# Patient Record
Sex: Male | Born: 2000 | Hispanic: Yes | Marital: Married | Smoking: Current every day smoker
Health system: Southern US, Community
[De-identification: ages and names within clinical notes are randomized; demographics above are authoritative.]

## PROBLEM LIST (undated history)

## (undated) ENCOUNTER — Emergency Department (HOSPITAL_COMMUNITY): Payer: Medicaid Other | Source: Home / Self Care

## (undated) ENCOUNTER — Emergency Department (HOSPITAL_COMMUNITY): Admission: EM | Payer: Self-pay | Source: Home / Self Care

## (undated) DIAGNOSIS — J45909 Unspecified asthma, uncomplicated: Secondary | ICD-10-CM

## (undated) DIAGNOSIS — R011 Cardiac murmur, unspecified: Secondary | ICD-10-CM

## (undated) DIAGNOSIS — F988 Other specified behavioral and emotional disorders with onset usually occurring in childhood and adolescence: Secondary | ICD-10-CM

## (undated) DIAGNOSIS — G40909 Epilepsy, unspecified, not intractable, without status epilepticus: Secondary | ICD-10-CM

## (undated) DIAGNOSIS — Z5900 Homelessness unspecified: Secondary | ICD-10-CM

## (undated) DIAGNOSIS — H539 Unspecified visual disturbance: Secondary | ICD-10-CM

## (undated) DIAGNOSIS — F913 Oppositional defiant disorder: Secondary | ICD-10-CM

## (undated) DIAGNOSIS — F329 Major depressive disorder, single episode, unspecified: Secondary | ICD-10-CM

## (undated) DIAGNOSIS — G43909 Migraine, unspecified, not intractable, without status migrainosus: Secondary | ICD-10-CM

## (undated) DIAGNOSIS — F32A Depression, unspecified: Secondary | ICD-10-CM

## (undated) DIAGNOSIS — E109 Type 1 diabetes mellitus without complications: Secondary | ICD-10-CM

## (undated) DIAGNOSIS — F419 Anxiety disorder, unspecified: Secondary | ICD-10-CM

## (undated) DIAGNOSIS — R51 Headache: Secondary | ICD-10-CM

## (undated) DIAGNOSIS — B1920 Unspecified viral hepatitis C without hepatic coma: Secondary | ICD-10-CM

## (undated) DIAGNOSIS — T7840XA Allergy, unspecified, initial encounter: Secondary | ICD-10-CM

## (undated) DIAGNOSIS — D573 Sickle-cell trait: Secondary | ICD-10-CM

## (undated) HISTORY — PX: CIRCUMCISION: SUR203

---
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
## 2018-01-27 ENCOUNTER — Other Ambulatory Visit (INDEPENDENT_AMBULATORY_CARE_PROVIDER_SITE_OTHER): Payer: Self-pay | Admitting: *Deleted

## 2018-01-27 DIAGNOSIS — E109 Type 1 diabetes mellitus without complications: Secondary | ICD-10-CM

## 2018-02-23 ENCOUNTER — Encounter (HOSPITAL_COMMUNITY): Payer: Self-pay | Admitting: Emergency Medicine

## 2018-02-23 ENCOUNTER — Other Ambulatory Visit: Payer: Self-pay

## 2018-02-23 ENCOUNTER — Emergency Department (HOSPITAL_COMMUNITY)
Admission: EM | Admit: 2018-02-23 | Discharge: 2018-02-25 | Disposition: A | Discharge status: Other Acute Inpatient Hospital | Payer: Medicaid Other | Attending: Emergency Medicine | Admitting: Emergency Medicine

## 2018-02-23 DIAGNOSIS — J45909 Unspecified asthma, uncomplicated: Secondary | ICD-10-CM | POA: Insufficient documentation

## 2018-02-23 DIAGNOSIS — Z79899 Other long term (current) drug therapy: Secondary | ICD-10-CM | POA: Insufficient documentation

## 2018-02-23 DIAGNOSIS — F332 Major depressive disorder, recurrent severe without psychotic features: Secondary | ICD-10-CM | POA: Insufficient documentation

## 2018-02-23 DIAGNOSIS — R739 Hyperglycemia, unspecified: Secondary | ICD-10-CM

## 2018-02-23 DIAGNOSIS — F913 Oppositional defiant disorder: Secondary | ICD-10-CM | POA: Insufficient documentation

## 2018-02-23 DIAGNOSIS — Z7722 Contact with and (suspected) exposure to environmental tobacco smoke (acute) (chronic): Secondary | ICD-10-CM | POA: Insufficient documentation

## 2018-02-23 DIAGNOSIS — Z794 Long term (current) use of insulin: Secondary | ICD-10-CM | POA: Insufficient documentation

## 2018-02-23 DIAGNOSIS — F902 Attention-deficit hyperactivity disorder, combined type: Secondary | ICD-10-CM | POA: Insufficient documentation

## 2018-02-23 DIAGNOSIS — R45851 Suicidal ideations: Secondary | ICD-10-CM | POA: Insufficient documentation

## 2018-02-23 DIAGNOSIS — E1165 Type 2 diabetes mellitus with hyperglycemia: Secondary | ICD-10-CM | POA: Insufficient documentation

## 2018-02-23 DIAGNOSIS — D573 Sickle-cell trait: Secondary | ICD-10-CM | POA: Insufficient documentation

## 2018-02-23 DIAGNOSIS — F419 Anxiety disorder, unspecified: Secondary | ICD-10-CM | POA: Insufficient documentation

## 2018-02-23 LAB — ACETAMINOPHEN LEVEL: Acetaminophen (Tylenol), Serum: <10 ug/mL — ABNORMAL LOW (ref 10–30)

## 2018-02-23 LAB — COMPREHENSIVE METABOLIC PANEL
ALBUMIN: 3.8 g/dL (ref 3.5–5.0)
ALK PHOS: 97 U/L (ref 38–126)
ALT: 19 U/L (ref 0–44)
AST: 24 U/L (ref 15–41)
Anion gap: 13 (ref 5–15)
BILIRUBIN TOTAL: 0.8 mg/dL (ref 0.3–1.2)
BUN: 6 mg/dL (ref 6–20)
CALCIUM: 8.9 mg/dL (ref 8.9–10.3)
CO2: 22 mmol/L (ref 22–32)
Chloride: 101 mmol/L (ref 98–111)
Creatinine, Ser: 0.97 mg/dL (ref 0.61–1.24)
GFR calc Af Amer: >60 mL/min (ref >60)
GFR calc non Af Amer: >60 mL/min (ref >60)
GLUCOSE: 431 mg/dL — AB (ref 70–99)
Potassium: 3.6 mmol/L (ref 3.5–5.1)
SODIUM: 136 mmol/L (ref 135–145)
TOTAL PROTEIN: 6.4 g/dL — AB (ref 6.5–8.1)

## 2018-02-23 LAB — ETHANOL: Alcohol, Ethyl (B): <10 mg/dL (ref <10)

## 2018-02-23 LAB — CBG MONITORING, ED
GLUCOSE-CAPILLARY: 448 mg/dL — AB (ref 70–99)
GLUCOSE-CAPILLARY: 171 mg/dL — AB (ref 70–99)

## 2018-02-23 LAB — CBC
HEMATOCRIT: 45.5 % (ref 39.0–52.0)
HEMOGLOBIN: 15.1 g/dL (ref 13.0–17.0)
MCH: 27.6 pg (ref 26.0–34.0)
MCHC: 33.2 g/dL (ref 30.0–36.0)
MCV: 83 fL (ref 78.0–100.0)
Platelets: 236 10*3/uL (ref 150–400)
RBC: 5.48 MIL/uL (ref 4.22–5.81)
RDW: 12.1 % (ref 11.5–15.5)
WBC: 7.1 10*3/uL (ref 4.0–10.5)

## 2018-02-23 LAB — RAPID URINE DRUG SCREEN, HOSP PERFORMED
Amphetamines: NOT DETECTED
BARBITURATES: NOT DETECTED
Benzodiazepines: NOT DETECTED
Cocaine: NOT DETECTED
Opiates: NOT DETECTED
TETRAHYDROCANNABINOL: NOT DETECTED

## 2018-02-23 LAB — SALICYLATE LEVEL: Salicylate Lvl: <7 mg/dL (ref 2.8–30.0)

## 2018-02-23 MED ORDER — INSULIN GLARGINE 100 UNIT/ML ~~LOC~~ SOLN
30.0000 [IU] | Freq: Two times a day (BID) | SUBCUTANEOUS | Status: DC
  Administered 2018-02-23 – 2018-02-25 (×4): 30 [IU] via SUBCUTANEOUS
  Filled 2018-02-23 (×8): qty 0.3

## 2018-02-23 NOTE — ED Triage Notes (Signed)
Pt arrived [facility] from [facility] where he reported suicidal ideation with plan to jump in from of a bus to the psychiatrist, pt is currently IVC, papers served prior to pt arrival. Pt also presents with hyperglycemia, Per EMS glucometer read "high" at [facility] and pt administered 20 units Lantus, pt received 2L NS with EMS PTA. Vitals with EMS: BP152/90 P120 RR18 98% RA las CBG with EMS 464

## 2018-02-23 NOTE — ED Provider Notes (Signed)
[facility] EMERGENCY DEPARTMENT Provider Note   CSN: [id number] Arrival date & time: 02/23/18  1624     History   Chief Complaint Chief Complaint  Patient presents with  . Suicidal  . Hyperglycemia    HPI [name] [name]. is a 17 y.o. male ending for evaluation of suicidal thoughts and hyperglycemia.  Patient states [name] [name] has been having suicidal thoughts.  [name] [name] states his family does not want him to live at home, [name] [name] has an ankle monitor and is not able to leave.  [name] [name] thinks his only option is to go back to prison.  [name] [name] was released several months ago, does not want to go back.  [name] [name] denies HI or AVH.  [name] [name] is currently under IVC per triage note. Patient states [name] [name] was at [facility] when they checked his blood sugar, it was read as high.  [name] [name] took 20 units of Lantus, received 2 L normal saline with EMS.  CBG on arrival was 464. Patient states his only complaint is [name] [name] feels hungry.  [name] [name] denies polyuria or polydipsia.  [name] [name] denies recent fevers, chills, chest pain, shortness breath, nausea, vomiting, abdominal pain, or abnormal urination.  [name] [name] has no medical complaints at this time.  Patient states [name] [name] has not been taking his insulin on a scheduled basis, due to change in the way insulin was given while [name] [name] was in jail.  Additionally, [name] [name] is not taking any of his other medications that [name] [name] states [name] [name] is supposed to be on.  HPI  Past Medical History:  Diagnosis Date  . ADD (attention deficit disorder)   . Allergy   . Anxiety   . Asthma   . Depression   . Epileptic seizure (HCC)    "both petit and grand mal; last sz ~ 2 wk ago" (06/17/2013)  . Headache(784.0)    "2-3 times/wk usually" (06/17/2013)  . Heart murmur    "heard a slight one earlier today" (06/17/2013)  . Migraine    "maybe once/month; it's severe" (06/17/2013)  . ODD (oppositional defiant disorder)   . Sickle cell trait (HCC)   . Type I diabetes mellitus (HCC)    "that's what they're thinking now" (06/17/2013)  .  Vision abnormalities    "takes him longer to focus cause; from his sz" (06/17/2013)    Patient Active Problem List   Diagnosis Date Noted  . 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 DKA, type 1 (HCC) 07/16/2017  . Nausea & vomiting 07/16/2017  . Uncontrolled type 1 diabetes circulatory disorder erectile dysfunction (HCC)   . DKA (diabetic ketoacidoses) (HCC) 12/18/2016  . History of seizures 12/01/2016  . History of migraine 12/01/2016  . 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 Disordered eating 03/08/2015  . Ketonuria   . Adjustment reaction to medical therapy   . Non compliance w medication regimen   . 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 Dehydration 08/22/2014  . Hyperglycemia due to type 1 diabetes mellitus (HCC) 08/21/2014  . Type 1 diabetes mellitus with hyperglycemia (HCC)   . Noncompliance   . Generalized abdominal pain   . Glycosuria   . Depression 07/12/2014  . Involuntary movements 06/13/2014  . Bilateral leg pain 06/13/2014  . 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 Suicidal ideation 03/31/2014  . ODD (oppositional defiant disorder) 03/03/2014  . MDD (major depressive disorder), recurrent episode,  moderate (HCC) 02/16/2014  . Attention deficit hyperactivity disorder (ADHD), combined type, moderate 02/16/2014  . Microcytic anemia 02/15/2014  . Hyperglycemia 02/14/2014  . Goiter 02/04/2014  . 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 Acquired acanthosis nigricans 02/04/2014  . Obesity, morbid (HCC) 02/04/2014  . Insulin resistance 02/04/2014  . Hyperinsulinemia 02/04/2014  . Hypoglycemia associated with diabetes (HCC) 02/02/2014  . Maladaptive health behaviors affecting medical condition 02/02/2014  . Hypoglycemia  02/02/2014  . Type 1 diabetes mellitus in patient 30 to 19 years of age with hemoglobin A1c goal of less than 7.5% (HCC) 01/26/2014  . 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  . 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 Migraine without aura 12/13/2013  . Body mass index, pediatric, greater than or equal to 95th percentile for age 38/16/2015  . Asthma 12/13/2013  . Hypoglycemia unawareness in type 1 diabetes mellitus (HCC) 08/08/2013  . Seizure disorder (HCC) 07/06/2013  . Short-term memory loss 07/06/2013  . Sickle cell trait (HCC) 07/06/2013  . Diabetes (HCC) 06/17/2013  . Diabetes mellitus, new onset (HCC) 06/17/2013    Past Surgical History:  Procedure Laterality Date  . CIRCUMCISION  2000  . FINGER SURGERY Left 2001   "crushed pinky; had to repair it" (06/17/2013)        Home Medications    Prior to Admission medications   Medication Sig Start Date End Date Taking? Authorizing Provider  albuterol (PROVENTIL HFA;VENTOLIN HFA) 108 (90 Base) MCG/ACT inhaler Inhale 2 puffs into the lungs every 6 (six) hours as needed for wheezing or shortness of breath.    [provider]  cetirizine (ZYRTEC) 10 MG tablet Take 10 mg by mouth at bedtime as needed for allergies.    [provider]  chlorproMAZINE (THORAZINE) 50 MG tablet Take 1 tablet (50 mg total) by mouth 2 (two) times daily. For mood control Patient not taking: Reported on 08/15/2017 08/07/17   Armandina Stammer I, NP  clindamycin (CLEOCIN T) 1 % external solution Apply 1 application topically 2 (two) times daily as needed. Apply to affected area twice a day if needed.    [provider]  EPINEPHrine 0.3 mg/0.3 mL IJ SOAJ injection Inject 0.3 mg into the muscle as needed (PRN for anaphylaxis).    [provider]  escitalopram (LEXAPRO) 20 MG tablet Take 1 tablet (20 mg total) by mouth daily. For depression Patient not taking: Reported on 08/15/2017 08/08/17   Armandina Stammer I, NP  fluticasone (FLONASE) 50 MCG/ACT nasal spray Place 1 spray into both nostrils daily.    [provider]  fluticasone (FLOVENT HFA) 110 MCG/ACT inhaler Inhale 1 puff into the lungs 2 (two) times daily.    [provider]  gabapentin (NEURONTIN) 300 MG capsule Take 1 capsule (300 mg total) by mouth 3 (three) times daily. For agitation Patient not taking: Reported on 08/15/2017 08/07/17   Armandina Stammer I, NP  glucagon (GLUCAGON EMERGENCY) 1 MG injection Inject 1 mg into the muscle once as needed (severe hypoglycemia - if unresponsive, unable to swallow, unconscious and/or has seizure). Patient not taking: Reported on 08/15/2017 08/07/17   Armandina Stammer I, NP  glucose blood (ACCU-CHEK GUIDE) test strip Test 8 times daily: For blood sugar checks or monitoring Patient not taking: Reported on 08/15/2017 08/07/17 08/07/18  Armandina Stammer I, NP  glucose blood (ACCU-CHEK GUIDE) test strip Check glucose 6x daily: For blood sugar checks Patient not taking: Reported on 08/15/2017 08/07/17   Nwoko,  Nelda Marseille, NP  hydrOXYzine (ATARAX/VISTARIL) 25 MG tablet Take 1 tablet (25 mg total) by mouth every 6 (six) hours as needed for anxiety. Patient not taking: Reported on 08/15/2017 08/07/17   Armandina Stammer I, NP  insulin aspart (NOVOLOG FLEXPEN) 100 UNIT/ML FlexPen Inject per protocol up to 50 units per day: For diabetes management Patient not taking: Reported on 08/15/2017 08/07/17 08/07/18  Armandina Stammer I, NP  insulin glargine (LANTUS) 100 UNIT/ML injection Inject 0.3 mLs (30 Units total) into the skin 2 (two) times daily. For diabetes management Patient not taking: Reported on 08/15/2017 08/07/17   Armandina Stammer I, NP  nicotine (NICODERM CQ - DOSED IN MG/24 HOURS) 21 mg/24hr patch Place 1 patch (21 mg total) onto the skin daily. For smoking cessation Patient not taking: Reported on 08/15/2017 08/08/17   Armandina Stammer I, NP  SUMAtriptan (IMITREX) 25 MG tablet Take 25 mg by mouth daily as needed for migraine. May repeat  in 2 hours if headache persists or recurs.    [provider]  traZODone (DESYREL) 100 MG tablet Take 1 tablet (100 mg) by mouth at bedtime: For sleep Patient not taking: Reported on 08/15/2017 08/07/17   Sanjuana Kava, NP    Family History Family History  Problem Relation Age of Onset  . Asthma Mother   . COPD Mother   . Other Mother        possible autoimmune, unclear  . Diabetes Maternal Grandmother   . Heart disease Maternal Grandmother   . Hypertension Maternal Grandmother   . Mental illness Maternal Grandmother   . Heart disease Maternal Grandfather   . Hyperlipidemia Paternal Grandmother   . Hyperlipidemia Paternal Grandfather   . Migraines Sister        Hemiplegic Migraines     Social History Social History   Tobacco Use  . Smoking status: Passive Smoke Exposure - Never Smoker  . Smokeless tobacco: Never Used  . Tobacco comment: Mom and dad smoke outside   Substance Use Topics  . Alcohol use: No    Alcohol/week: 0.0 standard drinks  . Drug use: No     Allergies   Bee venom; Fish allergy; and Shellfish allergy   Review of Systems Review of Systems  Psychiatric/Behavioral: Positive for dysphoric mood and suicidal ideas.  All other systems reviewed and are negative.    Physical Exam Updated Vital Signs BP (!) 146/81 (BP Location: Right Arm)   Pulse 72   Temp 98.7 F (37.1 C) (Oral)   Resp 18   SpO2 100%   Physical Exam  Constitutional: [name] [name] is oriented to person, place, and time. [name] [name] appears well-developed and well-nourished. No distress.  Sitting in the chair in no acute distress  HENT:  Head: Normocephalic and atraumatic.  Eyes: Pupils are equal, round, and reactive to light. Conjunctivae and EOM are normal.  Neck: Normal range of motion. Neck supple.  Cardiovascular: Normal rate, regular rhythm and intact distal pulses.  Pulmonary/Chest: Effort normal and breath sounds normal. No respiratory distress. [name] [name] has no wheezes.  Abdominal: Soft.  [name] [name] exhibits no distension and no mass. There is no tenderness. There is no guarding.  Musculoskeletal: Normal range of motion.  Neurological: [name] [name] is alert and oriented to person, place, and time.  Skin: Skin is warm and dry.  Psychiatric: [name] [name] is not actively hallucinating. [name] [name] exhibits a depressed mood. [name] [name] expresses suicidal ideation. [name] [name] expresses no homicidal ideation. [name] [name] expresses suicidal plans. [name] [name] expresses no homicidal plans.  Calm and cooperative.  Reporting SI without HI or AVH.  Nursing note and vitals reviewed.    ED Treatments / Results  Labs (all labs ordered are listed, but only abnormal results are displayed) Labs Reviewed  COMPREHENSIVE METABOLIC PANEL - Abnormal; Notable for the following components:      Result Value   Glucose, Bld 431 (*)    Total Protein 6.4 (*)    All other components within normal limits  ACETAMINOPHEN LEVEL - Abnormal; Notable for the following components:   Acetaminophen (Tylenol), Serum <10 (*)    All other components within normal limits  CBG MONITORING, ED - Abnormal; Notable for the following components:   Glucose-Capillary 448 (*)    All other components within normal limits  CBG MONITORING, ED - Abnormal; Notable for the following components:   Glucose-Capillary 171 (*)    All other components within normal limits  ETHANOL  SALICYLATE LEVEL  CBC  RAPID URINE DRUG SCREEN, HOSP PERFORMED    EKG None  Radiology No results found.  Procedures Procedures (including critical care time)  Medications Ordered in ED Medications  insulin glargine (LANTUS) injection 30 Units (has no administration in time range)     Initial Impression / Assessment and Plan / ED Course  I have reviewed the triage vital signs and the nursing notes.  Pertinent labs & imaging results that were available during my care of the patient were reviewed by me and considered in my medical decision making (see chart for details).     Patient presenting for  evaluation of suicidal thoughts and hyperglycemia.  Exam reassuring, patient appears nontoxic.  CBG improved in between initial reading this morning and arrival to the ER.  Will recheck.  Labs reassuring, anion gap and bicarb stable, no signs of DKA.  Urine without ketones. Likely hyperglycemia due to improper insulin use.  On reassessment, CBG improved to 171.  At this time, patient is medically cleared.  Discussed with Berna Spare from TTS, who recommends placing a TTS consult for further psychiatric evaluation.   Final Clinical Impressions(s) / ED Diagnoses   Final diagnoses:  Suicidal ideation  Hyperglycemia    ED Discharge Orders    None       Alveria Apley, PA-C 02/23/18 2118    Linwood Dibbles, MD 02/24/18 1143

## 2018-02-24 LAB — CBG MONITORING, ED
GLUCOSE-CAPILLARY: 377 mg/dL — AB (ref 70–99)
GLUCOSE-CAPILLARY: 310 mg/dL — AB (ref 70–99)
Glucose-Capillary: 522 mg/dL (ref 70–99)
Glucose-Capillary: 389 mg/dL — ABNORMAL HIGH (ref 70–99)
Glucose-Capillary: 271 mg/dL — ABNORMAL HIGH (ref 70–99)
Glucose-Capillary: 337 mg/dL — ABNORMAL HIGH (ref 70–99)

## 2018-02-24 MED ORDER — INSULIN ASPART 100 UNIT/ML ~~LOC~~ SOLN
6.0000 [IU] | Freq: Once | SUBCUTANEOUS | Status: AC
  Administered 2018-02-24: 6 [IU] via SUBCUTANEOUS
  Filled 2018-02-24: qty 1

## 2018-02-24 MED ORDER — INSULIN ASPART 100 UNIT/ML ~~LOC~~ SOLN
15.0000 [IU] | Freq: Once | SUBCUTANEOUS | Status: AC
  Administered 2018-02-24: 15 [IU] via SUBCUTANEOUS

## 2018-02-24 MED ORDER — INSULIN ASPART 100 UNIT/ML ~~LOC~~ SOLN
0.0000 [IU] | Freq: Three times a day (TID) | SUBCUTANEOUS | Status: DC
  Administered 2018-02-24: 15 [IU] via SUBCUTANEOUS
  Administered 2018-02-24: 11 [IU] via SUBCUTANEOUS
  Administered 2018-02-25: 3 [IU] via SUBCUTANEOUS
  Administered 2018-02-25: 5 [IU] via SUBCUTANEOUS
  Filled 2018-02-24 (×5): qty 1

## 2018-02-24 MED ORDER — LORAZEPAM 1 MG PO TABS
1.0000 mg | ORAL_TABLET | Freq: Four times a day (QID) | ORAL | Status: DC | PRN
  Administered 2018-02-24: 1 mg via ORAL
  Filled 2018-02-24: qty 1

## 2018-02-24 NOTE — ED Notes (Signed)
TTS at bedside. 

## 2018-02-24 NOTE — Progress Notes (Signed)
Inpatient Diabetes Program Recommendations  AACE/ADA: New Consensus Statement on Inpatient Glycemic Control (2019)  Target Ranges:  Prepandial:   less than 140 mg/dL      Peak postprandial:   less than 180 mg/dL (1-2 hours)      Critically ill patients:  140 - 180 mg/dL  Results for [name] [name], [name] [name] (MRN [id number]) as of 02/24/2018 12:34  Ref. Range 02/23/2018 16:33 02/23/2018 20:24 02/24/2018 06:32 02/24/2018 11:26  Glucose-Capillary Latest Ref Range: 70 - 99 mg/dL 336 (H) 122 (H) 449 (H) 522 (HH)    Review of Glycemic Control  Diabetes history: DM1 Outpatient Diabetes medications: Lantus 35 units BID, Novolog 0-15 units TID with meals Current orders for Inpatient glycemic control: Lantus 30 units BID, Novolog 0-15 units TID with meals  Inpatient Diabetes Program Recommendations:  Correction (SSI): Please consider ordering Novolog 0-5 units QHS for bedtime correction scale. Insulin - Meal Coverage: Please consider ordering Novolog 8 units TID with meals for meal coverage. Diet: Please discontinue Regular diet and order Carb Modified diet.  Insulin-Basal: If glucose continues to be consistently over 180 mg/dl with meal coverage, may want to consider increasing Lantus to 35 units BID.  Thanks, Orlando Penner, RN, MSN, CDE Diabetes Coordinator Inpatient Diabetes Program [phone] (Team Pager from 8am to 5pm)

## 2018-02-24 NOTE — ED Notes (Signed)
Pt refused to eat dinner, RN aware

## 2018-02-24 NOTE — ED Notes (Signed)
parmacy called to send me pts latus

## 2018-02-24 NOTE — ED Notes (Signed)
Pt made phone call X1
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
## 2018-02-24 NOTE — ED Notes (Signed)
Dinner order place, Diabetic Diet

## 2018-02-24 NOTE — ED Notes (Signed)
CBG RESULT 271, RN AWARE

## 2018-02-24 NOTE — ED Notes (Signed)
Pt threw away dinner tray after refusing to eat meal, RN was made aware and talked with patient after which patient agreed to eat meal and another meal tray was ordered.  Dinner tray ordered talked with [name] [name] at [employer].

## 2018-02-24 NOTE — ED Notes (Signed)
Pt ate some of dinner tray

## 2018-02-24 NOTE — ED Notes (Signed)
Dr Effie Shy notified that pts cbg  Was 522 and order for 15 units rec

## 2018-02-24 NOTE — ED Notes (Signed)
Pt pacing around room and asking when he will be able to leave. Pt informed we have to get his blood sugar controlled. Asked patient if he feels anxious and if he wants to try to sleep. Asked if patient wants prn ativan. Pt states yes

## 2018-02-24 NOTE — ED Notes (Signed)
Patient has made 1 phone call

## 2018-02-24 NOTE — ED Notes (Signed)
CBG RESULT 337, RN AWARE

## 2018-02-24 NOTE — ED Notes (Addendum)
Pt. Meets inpatient criteria per [facility]. Look for placement in the AM

## 2018-02-24 NOTE — Progress Notes (Addendum)
Pt. meets criteria for inpatient treatment per [name], NP.  Referred out to the following hospitals:  [facility]  [facility]  [facility]  [facility]  [facility]  [facility]  [facility]  [facility]  [facility]    Disposition CSW will continue to follow for placement.  Timmothy Euler. [name], MSW, LCSWA Disposition Clinical Social Work [phone] (cell) [phone] (office)

## 2018-02-25 ENCOUNTER — Inpatient Hospital Stay (HOSPITAL_COMMUNITY)
Admission: AD | Admit: 2018-02-25 | Discharge: 2018-03-09 | DRG: 885 | Disposition: A | Discharge status: Home / Self Care | Payer: Medicaid Other | Source: Intra-hospital | Attending: Psychiatry | Admitting: Psychiatry

## 2018-02-25 ENCOUNTER — Encounter (HOSPITAL_COMMUNITY): Payer: Self-pay | Admitting: *Deleted

## 2018-02-25 ENCOUNTER — Other Ambulatory Visit: Payer: Self-pay

## 2018-02-25 DIAGNOSIS — Z653 Problems related to other legal circumstances: Secondary | ICD-10-CM | POA: Diagnosis not present

## 2018-02-25 DIAGNOSIS — Z915 Personal history of self-harm: Secondary | ICD-10-CM | POA: Diagnosis not present

## 2018-02-25 DIAGNOSIS — Z794 Long term (current) use of insulin: Secondary | ICD-10-CM

## 2018-02-25 DIAGNOSIS — F603 Borderline personality disorder: Secondary | ICD-10-CM | POA: Diagnosis not present

## 2018-02-25 DIAGNOSIS — Z818 Family history of other mental and behavioral disorders: Secondary | ICD-10-CM | POA: Diagnosis not present

## 2018-02-25 DIAGNOSIS — G40409 Other generalized epilepsy and epileptic syndromes, not intractable, without status epilepticus: Secondary | ICD-10-CM | POA: Diagnosis not present

## 2018-02-25 DIAGNOSIS — F419 Anxiety disorder, unspecified: Secondary | ICD-10-CM | POA: Diagnosis not present

## 2018-02-25 DIAGNOSIS — J45909 Unspecified asthma, uncomplicated: Secondary | ICD-10-CM | POA: Diagnosis present

## 2018-02-25 DIAGNOSIS — F913 Oppositional defiant disorder: Secondary | ICD-10-CM | POA: Diagnosis not present

## 2018-02-25 DIAGNOSIS — G4700 Insomnia, unspecified: Secondary | ICD-10-CM | POA: Diagnosis not present

## 2018-02-25 DIAGNOSIS — E1042 Type 1 diabetes mellitus with diabetic polyneuropathy: Secondary | ICD-10-CM | POA: Diagnosis not present

## 2018-02-25 DIAGNOSIS — E119 Type 2 diabetes mellitus without complications: Secondary | ICD-10-CM | POA: Diagnosis not present

## 2018-02-25 DIAGNOSIS — Z79899 Other long term (current) drug therapy: Secondary | ICD-10-CM | POA: Diagnosis not present

## 2018-02-25 DIAGNOSIS — F333 Major depressive disorder, recurrent, severe with psychotic symptoms: Secondary | ICD-10-CM | POA: Diagnosis not present

## 2018-02-25 DIAGNOSIS — R45851 Suicidal ideations: Secondary | ICD-10-CM | POA: Diagnosis not present

## 2018-02-25 DIAGNOSIS — F902 Attention-deficit hyperactivity disorder, combined type: Secondary | ICD-10-CM | POA: Diagnosis present

## 2018-02-25 DIAGNOSIS — E1043 Type 1 diabetes mellitus with diabetic autonomic (poly)neuropathy: Secondary | ICD-10-CM | POA: Diagnosis not present

## 2018-02-25 DIAGNOSIS — Z7722 Contact with and (suspected) exposure to environmental tobacco smoke (acute) (chronic): Secondary | ICD-10-CM | POA: Diagnosis present

## 2018-02-25 DIAGNOSIS — R451 Restlessness and agitation: Secondary | ICD-10-CM | POA: Diagnosis not present

## 2018-02-25 DIAGNOSIS — F609 Personality disorder, unspecified: Secondary | ICD-10-CM | POA: Diagnosis not present

## 2018-02-25 DIAGNOSIS — Z9114 Patient's other noncompliance with medication regimen: Secondary | ICD-10-CM

## 2018-02-25 DIAGNOSIS — G259 Extrapyramidal and movement disorder, unspecified: Secondary | ICD-10-CM | POA: Diagnosis not present

## 2018-02-25 DIAGNOSIS — F909 Attention-deficit hyperactivity disorder, unspecified type: Secondary | ICD-10-CM | POA: Diagnosis not present

## 2018-02-25 DIAGNOSIS — F329 Major depressive disorder, single episode, unspecified: Secondary | ICD-10-CM | POA: Diagnosis present

## 2018-02-25 DIAGNOSIS — Z590 Homelessness: Secondary | ICD-10-CM | POA: Diagnosis not present

## 2018-02-25 DIAGNOSIS — D573 Sickle-cell trait: Secondary | ICD-10-CM | POA: Diagnosis not present

## 2018-02-25 DIAGNOSIS — IMO0001 Reserved for inherently not codable concepts without codable children: Secondary | ICD-10-CM

## 2018-02-25 DIAGNOSIS — E1065 Type 1 diabetes mellitus with hyperglycemia: Secondary | ICD-10-CM

## 2018-02-25 LAB — GLUCOSE, CAPILLARY
Glucose-Capillary: 280 mg/dL — ABNORMAL HIGH (ref 70–99)
Glucose-Capillary: 275 mg/dL — ABNORMAL HIGH (ref 70–99)

## 2018-02-25 LAB — CBG MONITORING, ED
GLUCOSE-CAPILLARY: 181 mg/dL — AB (ref 70–99)
GLUCOSE-CAPILLARY: 344 mg/dL — AB (ref 70–99)
Glucose-Capillary: 230 mg/dL — ABNORMAL HIGH (ref 70–99)

## 2018-02-25 MED ORDER — INSULIN ASPART 100 UNIT/ML ~~LOC~~ SOLN
0.0000 [IU] | Freq: Three times a day (TID) | SUBCUTANEOUS | Status: DC
  Administered 2018-02-25: 8 [IU] via SUBCUTANEOUS
  Administered 2018-02-26: 5 [IU] via SUBCUTANEOUS
  Administered 2018-02-26: 15 [IU] via SUBCUTANEOUS
  Administered 2018-02-26: 5 [IU] via SUBCUTANEOUS
  Administered 2018-02-27: 11 [IU] via SUBCUTANEOUS
  Administered 2018-02-27: 3 [IU] via SUBCUTANEOUS
  Administered 2018-02-27: 15 [IU] via SUBCUTANEOUS
  Administered 2018-02-28 (×2): 5 [IU] via SUBCUTANEOUS
  Administered 2018-02-28: 15 [IU] via SUBCUTANEOUS
  Administered 2018-03-01: 8 [IU] via SUBCUTANEOUS
  Administered 2018-03-01 – 2018-03-02 (×2): 11 [IU] via SUBCUTANEOUS
  Administered 2018-03-02: 5 [IU] via SUBCUTANEOUS
  Administered 2018-03-02 – 2018-03-03 (×2): 15 [IU] via SUBCUTANEOUS
  Administered 2018-03-03 (×2): 5 [IU] via SUBCUTANEOUS
  Administered 2018-03-04: 8 [IU] via SUBCUTANEOUS
  Administered 2018-03-04: 5 [IU] via SUBCUTANEOUS
  Administered 2018-03-04: 8 [IU] via SUBCUTANEOUS
  Administered 2018-03-05: 15 [IU] via SUBCUTANEOUS
  Administered 2018-03-05: 8 [IU] via SUBCUTANEOUS
  Administered 2018-03-05: 11 [IU] via SUBCUTANEOUS
  Administered 2018-03-06 (×2): 5 [IU] via SUBCUTANEOUS
  Administered 2018-03-06: 15 [IU] via SUBCUTANEOUS
  Administered 2018-03-07 (×2): 8 [IU] via SUBCUTANEOUS
  Administered 2018-03-07: 3 [IU] via SUBCUTANEOUS
  Administered 2018-03-08: 8 [IU] via SUBCUTANEOUS
  Administered 2018-03-08: 15 [IU] via SUBCUTANEOUS
  Administered 2018-03-08 – 2018-03-09 (×3): 8 [IU] via SUBCUTANEOUS
  Filled 2018-02-25: qty 0.15

## 2018-02-25 MED ORDER — INSULIN ASPART 100 UNIT/ML ~~LOC~~ SOLN: 8.0000 [IU] | Freq: Three times a day (TID) | SUBCUTANEOUS | Status: DC

## 2018-02-25 MED ORDER — TRAZODONE HCL 100 MG PO TABS
100.0000 mg | ORAL_TABLET | Freq: Every evening | ORAL | Status: DC | PRN
  Administered 2018-02-25 – 2018-03-02 (×7): 100 mg via ORAL
  Filled 2018-02-25: qty 1
  Filled 2018-02-25: qty 2
  Filled 2018-02-25 (×18): qty 1

## 2018-02-25 MED ORDER — INSULIN ASPART 100 UNIT/ML ~~LOC~~ SOLN
2.0000 [IU] | Freq: Once | SUBCUTANEOUS | Status: AC
  Administered 2018-02-25: 2 [IU] via SUBCUTANEOUS

## 2018-02-25 MED ORDER — LORAZEPAM 1 MG PO TABS
1.0000 mg | ORAL_TABLET | Freq: Four times a day (QID) | ORAL | Status: DC | PRN
  Administered 2018-02-25: 1 mg via ORAL
  Filled 2018-02-25: qty 1

## 2018-02-25 MED ORDER — INSULIN GLARGINE 100 UNIT/ML ~~LOC~~ SOLN
30.0000 [IU] | Freq: Two times a day (BID) | SUBCUTANEOUS | Status: DC
  Administered 2018-02-25 – 2018-02-28 (×6): 30 [IU] via SUBCUTANEOUS

## 2018-02-25 MED ORDER — INSULIN ASPART 100 UNIT/ML ~~LOC~~ SOLN
8.0000 [IU] | Freq: Three times a day (TID) | SUBCUTANEOUS | Status: DC
  Administered 2018-02-25 – 2018-03-09 (×35): 8 [IU] via SUBCUTANEOUS

## 2018-02-25 NOTE — BHH Group Notes (Signed)
Adult Psychoeducational Group Note  Date:  02/25/2018 Time:  8:46 PM  Group Topic/Focus:  Wrap-Up Group:   The focus of this group is to help patients review their daily goal of treatment and discuss progress on daily workbooks.  Participation Level:  Active  Participation Quality:  Appropriate and Attentive  Affect:  Appropriate  Cognitive:  Alert and Appropriate  Insight: Appropriate and Lacking  Engagement in Group:  Engaged  Modes of Intervention:  Discussion and Education  Additional Comments:  Pt attended and participated in wrap up group this evening. Pt rated their day a 5/10, due to them being glad that they are out of the hospital, but not happy that they are here. Pt has no goal while they are her.   Chrisandra Netters 02/25/2018, 8:46 PM

## 2018-02-25 NOTE — ED Notes (Signed)
[employer] contacted for [facility]

## 2018-02-25 NOTE — ED Notes (Signed)
Dr. Adela Lank notified of CBG and diabetic coordinator recommendation.

## 2018-02-25 NOTE — ED Notes (Signed)
CBG done at 1230 with 230 mg/dl

## 2018-02-25 NOTE — ED Notes (Signed)
[name] [name] at [facility] contacted to update about intake. Ankle braclet charger placed I belongings bag. Pt attempts to call PO officer.

## 2018-02-25 NOTE — ED Notes (Signed)
Lunch has arrived but pt not eating.

## 2018-02-25 NOTE — Progress Notes (Signed)
Pt accepted to [facility], Bed 501-2   Shuvon Rankin, NP is the accepting provider.  Jolyne Loa, MD, is the attending provider.  Call report to [phone]  [facility] ED notified.   Pt is IVC Pt may be transported by [employer] Pt scheduled  to arrive at [facility] as soon as transport can be arranged.  Timmothy Euler. [name], MSW, LCSWA Disposition Clinical Social Work [phone] (cell) [phone] (office)

## 2018-02-25 NOTE — Consult Note (Signed)
Tele psych Assessment  [name] [name] Hageman., 17 y.o., male patient seen via telepsych by TTS and  this provider; chart reviewed and consulted with Dr. Lucianne Muss on 02/25/18.  On evaluation Raynaldo Wo xxxVarela [name] Hageman. reports that he is depressed but would not elaborate on what was causing his depression.  Patient would not answer question if he was suicidal, homicidal, psychotic, or paranoid.  Patient informed that he was in [facility] in January 2019 and inpatient again in February 2019 at another inpatient treatment facility prior to going to prison in May.  States that he was treated with Abilify and Thorazine while in prison.  States that he got out of prison in May and has not been on any medication.  Patient would not elaborate what happened at [facility] for an IVC to be taken out and him to be sent to the ED.  When asked question related to his mental health patient would only respond "I don't want to talk about it." or It doesn't matter."  Tried to encourage patient to talk because that is the only way we can help and know what type of help he is needs; patient still will not respond.   During evaluation [name] Congo xxxVarela Jr. is alert/oriented x 4; calm/cooperative; and mood is congruent with affect.  He does not appear to be responding to internal/external stimuli or delusional thoughts.   Recommendations:  Inpatient psychiatric treatment  Disposition: Recommend psychiatric Inpatient admission when medically cleared.  Ubaldo Daywalt B. Kyleah Pensabene, NP

## 2018-02-25 NOTE — Tx Team (Signed)
Initial Treatment Plan 02/25/2018 7:43 PM [name] [name] xxxVarela Montez [name]. LFY:[id number]    PATIENT STRESSORS: Financial difficulties Legal issue Occupational concerns   PATIENT STRENGTHS: Ability for insight Capable of independent living Communication skills Physical Health Supportive family/friends   PATIENT IDENTIFIED PROBLEMS: Risk of self harm "I was feeling like hurting myself, my plan was to jump in front of a bus"  Alteration in mood (Depression and Anxiety)                   DISCHARGE CRITERIA:  Improved stabilization in mood, thinking, and/or behavior Verbal commitment to aftercare and medication compliance  PRELIMINARY DISCHARGE PLAN: Outpatient therapy Return to previous living arrangement  PATIENT/FAMILY INVOLVEMENT: This treatment plan has been presented to and reviewed with the patient [name] [name]. The patient have been given the opportunity to ask questions and make suggestions.  Sherryl Manges, RN 02/25/2018, 7:43 PM

## 2018-02-25 NOTE — ED Notes (Signed)
Pt discharged ambulatory and stable in custody of [employer] Officer Young. All belongings , medication, valuables given to Officer.

## 2018-02-25 NOTE — ED Provider Notes (Signed)
Assumed care of the patient to 0 700 this morning.  I was stopped by the patient's nurse because he had been having persistent hyperglycemia.  Patient has had trouble controlling his type 1 diabetes at home.  He has been seen multiple times by the diabetes educator here.  She is put in notes recommending different therapies but has not yet been addressed.  His last blood sugar was 344.  She recommended that he get meal coverage should have 8 units of NovoLog.  I placed this order in.  It appears that he has been getting up to 15 units of insulin without significant control.   Melene Plan, DO 02/25/18 1131

## 2018-02-25 NOTE — ED Notes (Signed)
Pt now eating 2 [place] sandwhiches.

## 2018-02-25 NOTE — BH Assessment (Signed)
B[facility] Assessment Progress Note  Pt reassessed today by this clinician and Shuvon Rankin, NP. Pt was not very cooperative with reassessment and refused to answer if he was SI, HI or AVH. Pt kept saying that it didn't matter b/c no one would help him anyway. IP treatment is still recommended for pt, at this time.   Johny Shock. Ladona Ridgel, MS, NCC, LPCA Counselor

## 2018-02-25 NOTE — ED Notes (Signed)
Patient ate [place] sandwich x2 and applesauce cup x2

## 2018-02-25 NOTE — ED Notes (Signed)
Pt speaking with family on phone

## 2018-02-25 NOTE — Progress Notes (Signed)
Nursing Progress Note: 7p-7a D: Pt currently presents with a anxious/flirtatious/animated/childlike/arrogant affect and behavior. Pt states "I need to get off this hall. People here are fucking crazy retarded. I didn't deserve to be brought here. This is worse than solitary confinement." Interacting minimally with the milieu. Pt reports good sleep during the previous night with current medication regimen. Pt did attend wrap-up group.  A: Pt provided with medications per providers orders. Pt's labs and vitals were monitored throughout the night. Pt supported emotionally and encouraged to express concerns and questions. Pt educated on medications.  R: Pt's safety ensured with 15 minute and environmental checks. Pt currently denies SI, HI, and AVH. Pt verbally contracts to seek staff if SI,HI, or AVH occurs and to consult with staff before acting on any harmful thoughts. Will continue to monitor.

## 2018-02-25 NOTE — ED Notes (Signed)
Bandaid applied to left great toe after shower.  Appears like blister with skin pealled. Not full thickness of skin.

## 2018-02-25 NOTE — ED Notes (Signed)
Pt resting with eyes closed. Respirations even and non labored. Sitter at door.

## 2018-02-25 NOTE — Progress Notes (Signed)
Admission DAR NOTE: Pt is a 17 y/o male, transferred to [facility] from [facility] for SI with plan to jump in front of a bus. Presents to [facility] under IVC status with an ankle monitor on right ankle. Pt observed with blunted affect, irritable mood and elective mutism on initial approach will not answer assessment questions at times "I don't know". 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 Denies SI, HI, pain and AVH "no, not right now as we speak, it does comes and go; I was feeling like that so I wanted to hurt myself". States he lives with his father "he does not help me, he does not do shit for me". Reports he has not seen a provider (Psych & medical) since release from prison. Emotional support offered and availability offered. Skin assessment done and belonging searched per protocol. Novolog insulin (0.5 bottle placed in locker). Skin is intact without areas of breakdowns. Old scars noted on left wrist / arm. Unit orientation done, routines discussed and care plan reviewed with pt; understanding verbalized. Encouraged pt to voice concerns, comply with treatment regimen including groups. Q 15 minutes safety checks initiated without self harm gestures or outburst to note at this time.

## 2018-02-25 NOTE — Progress Notes (Signed)
Inpatient Diabetes Program Recommendations  AACE/ADA: New Consensus Statement on Inpatient Glycemic Control (2019)  Target Ranges:  Prepandial:   less than 140 mg/dL      Peak postprandial:   less than 180 mg/dL (1-2 hours)      Critically ill patients:  140 - 180 mg/dL   Results for [name] [name], [name] [name] (MRN [id number]) as of 02/25/2018 11:10  Ref. Range 02/24/2018 06:32 02/24/2018 11:26 02/24/2018 12:58 02/24/2018 15:29 02/24/2018 17:22 02/24/2018 21:30 02/25/2018 08:12 02/25/2018 10:21  Glucose-Capillary Latest Ref Range: 70 - 99 mg/dL 035 (H) 009 (HH) 381 (H) 271 (H) 337 (H) 310 (H) 181 (H) 344 (H)   Review of Glycemic Control  Diabetes history: DM1 Outpatient Diabetes medications: Lantus 35 units BID, Novolog 0-15 units TID with meals Current orders for Inpatient glycemic control: Lantus 30 units BID, Novolog 0-15 units TID with meals  Inpatient Diabetes Program Recommendations:  Correction (SSI): Please consider ordering Novolog 0-5 units QHS for bedtime correction scale. Insulin - Meal Coverage: Please consider ordering Novolog 8 units TID with meals for meal coverage if patient eats at least 50% of meals. Diet: Please discontinue Regular diet and order Carb Modified diet.  Thanks, Orlando Penner, RN, MSN, CDE Diabetes Coordinator Inpatient Diabetes Program [phone] (Team Pager from 8am to 5pm)

## 2018-02-25 NOTE — ED Notes (Signed)
TTS at bedsdie  

## 2018-02-26 DIAGNOSIS — F603 Borderline personality disorder: Secondary | ICD-10-CM

## 2018-02-26 DIAGNOSIS — F333 Major depressive disorder, recurrent, severe with psychotic symptoms: Principal | ICD-10-CM

## 2018-02-26 LAB — GLUCOSE, CAPILLARY
GLUCOSE-CAPILLARY: 138 mg/dL — AB (ref 70–99)
Glucose-Capillary: 239 mg/dL — ABNORMAL HIGH (ref 70–99)
Glucose-Capillary: 242 mg/dL — ABNORMAL HIGH (ref 70–99)
Glucose-Capillary: 425 mg/dL — ABNORMAL HIGH (ref 70–99)
Glucose-Capillary: 337 mg/dL — ABNORMAL HIGH (ref 70–99)

## 2018-02-26 MED ORDER — HYDROXYZINE HCL 50 MG PO TABS
50.0000 mg | ORAL_TABLET | Freq: Four times a day (QID) | ORAL | Status: DC | PRN
  Administered 2018-02-26 – 2018-03-08 (×8): 50 mg via ORAL
  Filled 2018-02-26 (×3): qty 1
  Filled 2018-02-26: qty 10
  Filled 2018-02-26 (×5): qty 1

## 2018-02-26 MED ORDER — CHLORPROMAZINE HCL 25 MG PO TABS
50.0000 mg | ORAL_TABLET | Freq: Four times a day (QID) | ORAL | Status: DC | PRN
  Administered 2018-03-01: 50 mg via ORAL
  Filled 2018-02-26: qty 2

## 2018-02-26 MED ORDER — NICOTINE 21 MG/24HR TD PT24
21.0000 mg | MEDICATED_PATCH | Freq: Every day | TRANSDERMAL | Status: DC
  Administered 2018-02-26 – 2018-03-03 (×2): 21 mg via TRANSDERMAL
  Filled 2018-02-26 (×15): qty 1

## 2018-02-26 MED ORDER — LORAZEPAM 1 MG PO TABS
1.0000 mg | ORAL_TABLET | Freq: Four times a day (QID) | ORAL | Status: DC | PRN
  Administered 2018-03-01: 1 mg via ORAL
  Filled 2018-02-26: qty 1

## 2018-02-26 MED ORDER — ACETAMINOPHEN 325 MG PO TABS
650.0000 mg | ORAL_TABLET | Freq: Four times a day (QID) | ORAL | Status: DC | PRN
  Administered 2018-02-26 – 2018-03-05 (×2): 650 mg via ORAL
  Filled 2018-02-26 (×2): qty 2

## 2018-02-26 MED ORDER — BENZTROPINE MESYLATE 1 MG/ML IJ SOLN
1.0000 mg | Freq: Two times a day (BID) | INTRAMUSCULAR | Status: DC | PRN
  Administered 2018-03-02: 1 mg via INTRAMUSCULAR
  Filled 2018-02-26: qty 2

## 2018-02-26 MED ORDER — BENZTROPINE MESYLATE 1 MG PO TABS: 1.0000 mg | ORAL_TABLET | Freq: Two times a day (BID) | ORAL | Status: DC | PRN

## 2018-02-26 MED ORDER — CHLORPROMAZINE HCL 25 MG/ML IJ SOLN
50.0000 mg | Freq: Four times a day (QID) | INTRAMUSCULAR | Status: DC | PRN
  Administered 2018-03-02: 50 mg via INTRAMUSCULAR
  Filled 2018-02-26 (×2): qty 2

## 2018-02-26 MED ORDER — CHLORPROMAZINE HCL 50 MG PO TABS
50.0000 mg | ORAL_TABLET | Freq: Two times a day (BID) | ORAL | Status: DC
  Administered 2018-02-26 – 2018-03-03 (×3): 50 mg via ORAL
  Filled 2018-02-26 (×16): qty 1

## 2018-02-26 MED ORDER — LORAZEPAM 2 MG/ML IJ SOLN: 1.0000 mg | Freq: Four times a day (QID) | INTRAMUSCULAR | Status: DC | PRN

## 2018-02-26 NOTE — H&P (Signed)
Psychiatric Admission Assessment Adult  Patient Identification: [name] [name] xxxVarela Montez [name]. MRN:  [id number] Date of Evaluation:  02/26/2018 Chief Complaint:  MDD REC SEV Principal Diagnosis: MDD (major depressive disorder), recurrent, severe, with psychosis (HCC) Diagnosis:   Patient Active Problem List   Diagnosis Date Noted  . Borderline personality disorder (HCC) [F60.3] 02/26/2018  . MDD (major depressive disorder), recurrent, severe, with psychosis (HCC) [F33.3] 02/25/2018  . Major depressive disorder, recurrent severe without psychotic features (HCC) [F33.2] 07/24/2017  . DKA, type 1 (HCC) [E10.10] 07/16/2017  . Nausea & vomiting [R11.2] 07/16/2017  . Uncontrolled type 1 diabetes circulatory disorder erectile dysfunction (HCC) [E10.59, N52.1, E10.65]   . DKA (diabetic ketoacidoses) (HCC) [E13.10] 12/18/2016  . History of seizures [Z87.898] 12/01/2016  . History of migraine [Z86.69] 12/01/2016  . MDD (major depressive disorder), recurrent severe, without psychosis (HCC) [F33.2] 03/11/2015  . Disordered eating [F50.9] 03/08/2015  . Ketonuria [R82.4]   . Adjustment reaction to medical therapy [F43.20]   . Non compliance w medication regimen [Z91.14]   . 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 Dehydration [E86.0] 08/22/2014  . Hyperglycemia due to type 1 diabetes mellitus (HCC) [E10.65] 08/21/2014  . Type 1 diabetes mellitus with hyperglycemia (HCC) [E10.65]   . Noncompliance [Z91.19]   . Generalized abdominal pain [R10.84]   . Glycosuria [R81]   . Depression [F32.9] 07/12/2014  . Involuntary movements [R25.9] 06/13/2014  . Bilateral leg pain [M79.604, M79.605] 06/13/2014  . 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 Suicidal ideation [R45.851] 03/31/2014  . ODD (oppositional defiant disorder) [F91.3] 03/03/2014  . MDD (major depressive disorder),  recurrent episode, moderate (HCC) [F33.1] 02/16/2014  . 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 Microcytic anemia [D50.9] 02/15/2014  . Hyperglycemia [R73.9] 02/14/2014  . Goiter [E04.9] 02/04/2014  . 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 Acquired acanthosis nigricans [L83] 02/04/2014  . Obesity, morbid (HCC) [E66.01] 02/04/2014  . Insulin resistance [E88.81] 02/04/2014  . 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 Hypoglycemia associated with diabetes (HCC) [E11.649] 02/02/2014  . Maladaptive health behaviors affecting medical condition [F54] 02/02/2014  . Hypoglycemia [E16.2] 02/02/2014  . Type 1 diabetes mellitus in patient 22 to 19 years of age with hemoglobin A1c goal of less than 7.5% (HCC) [E10.9] 01/26/2014  . 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 Migraine without aura [G43.009] 12/13/2013  . Body mass index, pediatric, greater than or equal to 95th percentile for age [facility] 12/13/2013  . Asthma [J45.909] 12/13/2013  . Hypoglycemia unawareness in type 1 diabetes mellitus (HCC) [E10.649] 08/08/2013  . Seizure disorder (HCC) [G40.909] 07/06/2013  . Short-term memory loss [R41.3] 07/06/2013  . Sickle cell trait (HCC) [D57.3] 07/06/2013  . Diabetes (HCC) [E11.9] 06/17/2013  . Diabetes mellitus, new onset (HCC) [E11.9] 06/17/2013   History of Present Illness:   [name] [name] is an 17 y/o M with history of MDD, personality disorder, ADHD, (and elsewhere schizophrenia), who was [facility] from [facility] on [facility] initiated at [facility] after pt was at his outpatient follow up appointment and verbalized suicidal ideation with plan and worsening depression. Pt was medically cleared at [facility] and then transferred to [facility] for additional treatment and stabilization.  Upon initial interview, pt shares,  "I don't know why I'm here." With encouragement, pt acknowledges narrative of events prior to hospitalization, sharing, "  I was at [facility], and I was upset. I let a few words slip that I shouldn't have." Pt shares that he was feeling suicidal at that time and he expressed plan to walk in front of a bus. He denies current SI/HI/AH/VH. He reports increasing depression since he left prison about 3 weeks ago and has been staying with his father with whom pt has strained relationship. Pt endorses depressive symptoms of poor sleep with initial insomnia, anhedonia, guilty feelings, low energy, and poor concentration. He denies symptoms of mania, OCD, and PTSD. He reports using synthetic cannabis (K2) daily while in prison, but he has not had any illicit substance use since then.  Discussed with patient about treatment options. He has not been taking any medications since his discharge from prison. After last discharge from [name] in February 2019, pt was on thorazine and lexapro. He reports that lexapro was not helpful and he stopped. He was admitted to [facility] in [place] in May before he went to prison but he cannot recall his medications from then. He took abilify in prison, but he feels it was not helpful. Pt is in agreement to be restarted on previous medication of thorazine. We discussed that he may need to plan to return to staying with his father as he cannot be placed a shelter (has ankle bracelet on parole) and he is not appropriate for a group home. Pt verbalized understanding, and he will continue to work with treatment team regarding discharge planning during his stay.     Associated Signs/Symptoms: Depression Symptoms:  depressed mood, anhedonia, fatigue, feelings of worthlessness/guilt, difficulty concentrating, hopelessness, suicidal thoughts with specific plan, anxiety, (Hypo) Manic Symptoms:  Distractibility, Hallucinations, Anxiety Symptoms:  Excessive Worry, Psychotic Symptoms:   NA PTSD Symptoms: NA Total Time spent with patient: 1 hour  Past Psychiatric History:   -Dx of anxiety, ADHD, schizophrenia, MDD, and personality disorder - multiple inpt stays - last at [facility] in Jan & Feb 2019, and then at another facility in May 2019 - No current outpatient provider - multiple previous suicide attempts  Is the patient at risk to self? Yes.    Has the patient been a risk to self in the past 6 months? Yes.    Has the patient been a risk to self within the distant past? Yes.    Is the patient a risk to others? Yes.    Has the patient been a risk to others in the past 6 months? Yes.    Has the patient been a risk to others within the distant past? Yes.     Prior Inpatient Therapy:   Prior Outpatient Therapy:    Alcohol Screening: Patient refused Alcohol Screening Tool: Yes 1. How often do you have a drink containing alcohol?: Never 2. How many drinks containing alcohol do you have on a typical day when you are drinking?: 1 or 2 3. How often do you have six or more drinks on one occasion?: Never AUDIT-C Score: 0 4. How often during the last year have you found that you were not able to stop drinking once you had started?: Never 5. How often during the last year have you failed to do what was normally expected from you becasue of drinking?: Never 6. How often during the last year have you needed a first drink in the morning to get yourself going after a heavy drinking session?: Never 7. How often during the last year have you had a feeling of guilt of remorse  after drinking?: Never 8. How often during the last year have you been unable to remember what happened the night before because you had been drinking?: Never 9. Have you or someone else been injured as a result of your drinking?: No 10. Has a relative or friend or a doctor or another health worker been concerned about your drinking or suggested you cut down?: No Alcohol Use Disorder Identification Test Final Score  (AUDIT): 0 Intervention/Follow-up: Patient Refused Substance Abuse History in the last 12 months:  Yes.   Consequences of Substance Abuse: Medical Consequences:  worsened mood symptoms Previous Psychotropic Medications: Yes  Psychological Evaluations: Yes  Past Medical History:  Past Medical History:  Diagnosis Date  . ADD (attention deficit disorder)   . Allergy   . Anxiety   . Asthma   . Depression   . Epileptic seizure (HCC)    "both petit and grand mal; last sz ~ 2 wk ago" (06/17/2013)  . Headache(784.0)    "2-3 times/wk usually" (06/17/2013)  . Heart murmur    "heard a slight one earlier today" (06/17/2013)  . Migraine    "maybe once/month; it's severe" (06/17/2013)  . ODD (oppositional defiant disorder)   . Sickle cell trait (HCC)   . Type I diabetes mellitus (HCC)    "that's what they're thinking now" (06/17/2013)  . Vision abnormalities    "takes him longer to focus cause; from his sz" (06/17/2013)    Past Surgical History:  Procedure Laterality Date  . CIRCUMCISION  2000  . FINGER SURGERY Left 2001   "crushed pinky; had to repair it" (06/17/2013)   Family History:  Family History  Problem Relation Age of Onset  . Asthma Mother   . COPD Mother   . Other Mother        possible autoimmune, unclear  . Diabetes Maternal Grandmother   . Heart disease Maternal Grandmother   . Hypertension Maternal Grandmother   . Mental illness Maternal Grandmother   . Heart disease Maternal Grandfather   . Hyperlipidemia Paternal Grandmother   . Hyperlipidemia Paternal Grandfather   . Migraines Sister        Hemiplegic Migraines    Family Psychiatric  History: mother and sister possible bipolar and schizophrenia, grandmother (maternal) schizophrenia Tobacco Screening: Have you used any form of tobacco in the last 30 days? (Cigarettes, Smokeless Tobacco, Cigars, and/or Pipes): Yes Tobacco use, Select all that apply: 5 or more cigarettes per day("I smoke 1 pkt of cigarettes per  day".) Are you interested in Tobacco Cessation Medications?: No, patient refused Counseled patient on smoking cessation including recognizing danger situations, developing coping skills and basic information about quitting provided: Refused/Declined practical counseling Social History:  Social History   Substance and Sexual Activity  Alcohol Use No  . Alcohol/week: 0.0 standard drinks     Social History   Substance and Sexual Activity  Drug Use No    Additional Social History:                           Allergies:   Allergies  Allergen Reactions  . Bee Venom Anaphylaxis  . Fish Allergy Anaphylaxis  . Shellfish Allergy Anaphylaxis   Lab Results:  Results for orders placed or performed during the hospital encounter of 02/25/18 (from the past 48 hour(s))  Glucose, capillary     Status: Abnormal   Collection Time: 02/25/18  4:49 PM  Result Value Ref Range   Glucose-Capillary 280 (H) 70 -  99 mg/dL   Comment 1 Notify RN    Comment 2 Document in Chart   Glucose, capillary     Status: Abnormal   Collection Time: 02/25/18  7:52 PM  Result Value Ref Range   Glucose-Capillary 275 (H) 70 - 99 mg/dL  Glucose, capillary     Status: Abnormal   Collection Time: 02/26/18  6:22 AM  Result Value Ref Range   Glucose-Capillary 239 (H) 70 - 99 mg/dL  Glucose, capillary     Status: Abnormal   Collection Time: 02/26/18 12:05 PM  Result Value Ref Range   Glucose-Capillary 242 (H) 70 - 99 mg/dL    Blood Alcohol level:  Lab Results  Component Value Date   ETH <10 02/23/2018   ETH <10 08/14/2017    Metabolic Disorder Labs:  Lab Results  Component Value Date   HGBA1C 10.1 (H) 01/26/2017   MPG 243 01/26/2017   MPG 263 12/18/2016   No results found for: PROLACTIN Lab Results  Component Value Date   TRIG 244 (H) 12/18/2016    Current Medications: Current Facility-Administered Medications  Medication Dose Route Frequency Provider Last Rate Last Dose  . chlorproMAZINE  (THORAZINE) tablet 50 mg  50 mg Oral BID Micheal Likens, MD      . hydrOXYzine (ATARAX/VISTARIL) tablet 50 mg  50 mg Oral Q6H PRN Micheal Likens, MD      . insulin aspart (novoLOG) injection 0-15 Units  0-15 Units Subcutaneous TID WC Rankin, [name], NP   5 Units at 02/26/18 1215  . insulin aspart (novoLOG) injection 8 Units  8 Units Subcutaneous TID WC Rankin, [name], NP   8 Units at 02/26/18 1216  . insulin glargine (LANTUS) injection 30 Units  30 Units Subcutaneous BID Rankin, [name], NP   30 Units at 02/26/18 0809  . nicotine (NICODERM CQ - dosed in mg/24 hours) patch 21 mg  21 mg Transdermal Daily Micheal Likens, MD      . traZODone (DESYREL) tablet 100 mg  100 mg Oral QHS,MR X 1 [name] A, NP   100 mg at 02/25/18 2100   PTA Medications: Medications Prior to Admission  Medication Sig Dispense Refill Last Dose  . albuterol (PROVENTIL HFA;VENTOLIN HFA) 108 (90 Base) MCG/ACT inhaler Inhale 2 puffs into the lungs every 6 (six) hours as needed for wheezing or shortness of breath.   02/23/2018 at Unknown time  . cetirizine (ZYRTEC) 10 MG tablet Take 10 mg by mouth at bedtime as needed for allergies.   02/23/2018 at Unknown time  . chlorproMAZINE (THORAZINE) 50 MG tablet Take 1 tablet (50 mg total) by mouth 2 (two) times daily. For mood control (Patient not taking: Reported on 08/15/2017) 60 tablet 0 Not Taking at Unknown time  . EPINEPHrine 0.3 mg/0.3 mL IJ SOAJ injection Inject 0.3 mg into the muscle as needed (PRN for anaphylaxis).   unknown  . escitalopram (LEXAPRO) 20 MG tablet Take 1 tablet (20 mg total) by mouth daily. For depression (Patient not taking: Reported on 08/15/2017) 30 tablet 0 Not Taking at Unknown time  . gabapentin (NEURONTIN) 300 MG capsule Take 1 capsule (300 mg total) by mouth 3 (three) times daily. For agitation (Patient not taking: Reported on 08/15/2017) 90 capsule 0 Not Taking at Unknown time  . glucagon (GLUCAGON EMERGENCY) 1 MG  injection Inject 1 mg into the muscle once as needed (severe hypoglycemia - if unresponsive, unable to swallow, unconscious and/or has seizure). (Patient not taking: Reported on  08/15/2017) 1 each 12 Not Taking at Unknown time  . glucose blood (ACCU-CHEK GUIDE) test strip Test 8 times daily: For blood sugar checks or monitoring 200 each 5 Not Taking at Unknown time  . glucose blood (ACCU-CHEK GUIDE) test strip Check glucose 6x daily: For blood sugar checks 200 each 0 Not Taking at Unknown time  . hydrOXYzine (ATARAX/VISTARIL) 25 MG tablet Take 1 tablet (25 mg total) by mouth every 6 (six) hours as needed for anxiety. (Patient not taking: Reported on 08/15/2017) 60 tablet 0 Not Taking at Unknown time  . insulin aspart (NOVOLOG FLEXPEN) 100 UNIT/ML FlexPen Inject per protocol up to 50 units per day: For diabetes management (Patient taking differently: Inject 0-15 Units into the skin 3 (three) times daily with meals. Sliding scale) 15 mL 5 02/23/2018 at Unknown time  . insulin glargine (LANTUS) 100 UNIT/ML injection Inject 0.3 mLs (30 Units total) into the skin 2 (two) times daily. For diabetes management (Patient taking differently: Inject 35 Units into the skin 2 (two) times daily. For diabetes management) 10 mL 0 02/23/2018 at Unknown time  . nicotine (NICODERM CQ - DOSED IN MG/24 HOURS) 21 mg/24hr patch Place 1 patch (21 mg total) onto the skin daily. For smoking cessation (Patient not taking: Reported on 08/15/2017) 28 patch 0 Not Taking at Unknown time  . SUMAtriptan (IMITREX) 25 MG tablet Take 25 mg by mouth daily as needed for migraine. May repeat in 2 hours if headache persists or recurs.   Past Month at Unknown time  . traZODone (DESYREL) 100 MG tablet Take 1 tablet (100 mg) by mouth at bedtime: For sleep (Patient not taking: Reported on 08/15/2017) 30 tablet 0 Not Taking at Unknown time    Musculoskeletal: Strength & Muscle Tone: within normal limits Gait & Station: normal Patient leans:  N/A  Psychiatric Specialty Exam: Physical Exam  Nursing note and vitals reviewed.   Review of Systems  Constitutional: Negative for chills and fever.  Respiratory: Negative for cough and shortness of breath.   Cardiovascular: Negative for chest pain.  Gastrointestinal: Negative for abdominal pain, heartburn, nausea and vomiting.  Psychiatric/Behavioral: Positive for depression and suicidal ideas. Negative for hallucinations. The patient is nervous/anxious and has insomnia.     Blood pressure 129/82, pulse 89, temperature 99 F (37.2 C), temperature source Oral, resp. rate 18, height 6\' 3"  (1.905 m), weight 89.8 kg, SpO2 100 %.Body mass index is 24.75 kg/m.  General Appearance: Casual and Fairly Groomed  Eye Contact:  Good  Speech:  Clear and Coherent and Normal Rate  Volume:  Normal  Mood:  Anxious and Depressed  Affect:  Appropriate and Congruent  Thought Process:  Coherent and Goal Directed  Orientation:  Full (Time, Place, and Person)  Thought Content:  Logical  Suicidal Thoughts:  Yes.  without intent/plan  Homicidal Thoughts:  No  Memory:  Immediate;   Fair Recent;   Fair Remote;   Fair  Judgement:  Poor  Insight:  Lacking  Psychomotor Activity:  Normal  Concentration:  Concentration: Fair  Recall:  [employer] of Knowledge:  Fair  Language:  Fair  Akathisia:  No  Handed:    AIMS (if indicated):     Assets:  Resilience Social Support  ADL's:  Intact  Cognition:  WNL  Sleep:  Number of Hours: 6.75    Treatment Plan Summary: Daily contact with patient to assess and evaluate symptoms and progress in treatment and Medication management  Observation Level/Precautions:  15 minute checks  Laboratory:  CBC Chemistry Profile UDS UA  Psychotherapy:  Encourage participation in groups and therapeutic milieu   Medications:  Start thorazine 50mg  po BID. Continue current insulin orders. Start vistaril 50mg  po q6h prn anxiety.   Consultations:    Discharge Concerns:     Estimated LOS: 5-7 days  Other:     Physician Treatment Plan for Primary Diagnosis: MDD (major depressive disorder), recurrent, severe, with psychosis (HCC) Long Term Goal(s): Improvement in symptoms so as ready for discharge  Short Term Goals: Ability to demonstrate self-control will improve and Ability to identify and develop effective coping behaviors will improve  Physician Treatment Plan for Secondary Diagnosis: Principal Problem:   MDD (major depressive disorder), recurrent, severe, with psychosis (HCC) Active Problems:   Borderline personality disorder (HCC)  Long Term Goal(s): Improvement in symptoms so as ready for discharge  Short Term Goals: Ability to maintain clinical measurements within normal limits will improve  I certify that inpatient services furnished can reasonably be expected to improve the patient's condition.    Micheal Likens, MD 8/30/20192:47 PM

## 2018-02-26 NOTE — [facility] Group Notes (Signed)
[facility] LCSW Group Therapy  02/26/2018  1:05 PM  Type of Therapy:  Group therapy  Participation Level:  Invited.  Declined to attend.  Participation Quality:  Attentive  Affect:  Flat  Cognitive:  Oriented  Insight:  Limited  Engagement in Therapy:  Limited  Modes of Intervention:  Discussion, Socialization  Summary of Progress/Problems:  Chaplain was here to lead a group on themes of hope and courage.  Daryel Gerald B 02/26/2018 1:24 PM
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
## 2018-02-26 NOTE — BHH Group Notes (Signed)
Pt did not attend group this evening. Pt walked the halls instead.

## 2018-02-26 NOTE — Progress Notes (Signed)
D: Pt visible in milieu at intervals during shift. Presents animated, childlike and intrusive on interactions. Denies SI, HI, AVH and pain when assessed. Refused to attend scheduled groups or wake up for his AM insulin dose despite multiple attempts / prompts. Compliant with medications and CBG monitoring. CBG result at 1641 425 mg /dl and rechecks at 4098 was 337 mg/dl. Pt presents asymptomatic at thus far. Went off unit to courtyard with peers, returned without issues. A: Verbal education done on food choices in the cafeteria to assist in better glucose control. Scheduled medications given as ordered with verbal education and effects monitored. Encouraged to voice concerns, comply with treatment regimen. Q 15 minutes safety checks maintained.  R: Pt remains safe on and off unit. Denies concerns at this time.

## 2018-02-26 NOTE — Tx Team (Signed)
Interdisciplinary Treatment and Diagnostic Plan Update  02/26/2018 Time of Session: 8:34 AM  [name] [name] xxxVarela Brooke [name]. MRN: [id number]  Principal Diagnosis: MDD (major depressive disorder), recurrent, severe, with psychosis ([place])  Secondary Diagnoses: Active Problems:   MDD (major depressive disorder), recurrent, severe, with psychosis ([place])   Current Medications:  Current Facility-Administered Medications  Medication Dose Route Frequency Provider Last Rate Last Dose  . insulin aspart (novoLOG) injection 0-15 Units  0-15 Units Subcutaneous TID WC Rankin, [name], NP   5 Units at 02/26/18 0639  . insulin aspart (novoLOG) injection 8 Units  8 Units Subcutaneous TID WC Rankin, [name], NP   8 Units at 02/26/18 6578  . insulin glargine (LANTUS) injection 30 Units  30 Units Subcutaneous BID Rankin, [name], NP   30 Units at 02/26/18 0809  . LORazepam (ATIVAN) tablet 1 mg  1 mg Oral Q6H PRN Rankin, [name], NP   1 mg at 02/25/18 2051  . traZODone (DESYREL) tablet 100 mg  100 mg Oral QHS,MR X 1 Lindon Romp A, NP   100 mg at 02/25/18 2100    PTA Medications: Medications Prior to Admission  Medication Sig Dispense Refill Last Dose  . albuterol (PROVENTIL HFA;VENTOLIN HFA) 108 (90 Base) MCG/ACT inhaler Inhale 2 puffs into the lungs every 6 (six) hours as needed for wheezing or shortness of breath.   02/23/2018 at Unknown time  . cetirizine (ZYRTEC) 10 MG tablet Take 10 mg by mouth at bedtime as needed for allergies.   02/23/2018 at Unknown time  . chlorproMAZINE (THORAZINE) 50 MG tablet Take 1 tablet (50 mg total) by mouth 2 (two) times daily. For mood control (Patient not taking: Reported on 08/15/2017) 60 tablet 0 Not Taking at Unknown time  . EPINEPHrine 0.3 mg/0.3 mL IJ SOAJ injection Inject 0.3 mg into the muscle as needed (PRN for anaphylaxis).   unknown  . escitalopram (LEXAPRO) 20 MG tablet Take 1 tablet (20 mg total) by mouth daily. For depression (Patient not taking: Reported on 08/15/2017)  30 tablet 0 Not Taking at Unknown time  . gabapentin (NEURONTIN) 300 MG capsule Take 1 capsule (300 mg total) by mouth 3 (three) times daily. For agitation (Patient not taking: Reported on 08/15/2017) 90 capsule 0 Not Taking at Unknown time  . glucagon (GLUCAGON EMERGENCY) 1 MG injection Inject 1 mg into the muscle once as needed (severe hypoglycemia - if unresponsive, unable to swallow, unconscious and/or has seizure). (Patient not taking: Reported on 08/15/2017) 1 each 12 Not Taking at Unknown time  . glucose blood (ACCU-CHEK GUIDE) test strip Test 8 times daily: For blood sugar checks or monitoring 200 each 5 Not Taking at Unknown time  . glucose blood (ACCU-CHEK GUIDE) test strip Check glucose 6x daily: For blood sugar checks 200 each 0 Not Taking at Unknown time  . hydrOXYzine (ATARAX/VISTARIL) 25 MG tablet Take 1 tablet (25 mg total) by mouth every 6 (six) hours as needed for anxiety. (Patient not taking: Reported on 08/15/2017) 60 tablet 0 Not Taking at Unknown time  . insulin aspart (NOVOLOG FLEXPEN) 100 UNIT/ML FlexPen Inject per protocol up to 50 units per day: For diabetes management (Patient taking differently: Inject 0-15 Units into the skin 3 (three) times daily with meals. Sliding scale) 15 mL 5 02/23/2018 at Unknown time  . insulin glargine (LANTUS) 100 UNIT/ML injection Inject 0.3 mLs (30 Units total) into the skin 2 (two) times daily. For diabetes management (Patient taking differently: Inject 35 Units into the skin 2 (  two) times daily. For diabetes management) 10 mL 0 02/23/2018 at Unknown time  . nicotine (NICODERM CQ - DOSED IN MG/24 HOURS) 21 mg/24hr patch Place 1 patch (21 mg total) onto the skin daily. For smoking cessation (Patient not taking: Reported on 08/15/2017) 28 patch 0 Not Taking at Unknown time  . SUMAtriptan (IMITREX) 25 MG tablet Take 25 mg by mouth daily as needed for migraine. May repeat in 2 hours if headache persists or recurs.   Past Month at Unknown time  . traZODone  (DESYREL) 100 MG tablet Take 1 tablet (100 mg) by mouth at bedtime: For sleep (Patient not taking: Reported on 08/15/2017) 30 tablet 0 Not Taking at Unknown time    Patient Stressors: Financial difficulties Legal issue Occupational concerns  Patient Strengths: Ability for insight Capable of independent living Communication skills Physical Health Supportive family/friends  Treatment Modalities: Medication Management, Group therapy, Case management,  1 to 1 session with clinician, Psychoeducation, Recreational therapy.   Physician Treatment Plan for Primary Diagnosis: MDD (major depressive disorder), recurrent, severe, with psychosis ([place])   Long Term Goal(s): Improvement in symptoms so as ready for discharge  Short Term Goals:    Medication Management: Evaluate patient's response, side effects, and tolerance of medication regimen.  Therapeutic Interventions: 1 to 1 sessions, Unit Group sessions and Medication administration.  Evaluation of Outcomes: Progressing  Physician Treatment Plan for Secondary Diagnosis: Active Problems:   MDD (major depressive disorder), recurrent, severe, with psychosis ([place])   Long Term Goal(s): Improvement in symptoms so as ready for discharge  Short Term Goals:    Medication Management: Evaluate patient's response, side effects, and tolerance of medication regimen.  Therapeutic Interventions: 1 to 1 sessions, Unit Group sessions and Medication administration.  Evaluation of Outcomes: Progressing   RN Treatment Plan for Primary Diagnosis: MDD (major depressive disorder), recurrent, severe, with psychosis ([place]) Long Term Goal(s): Knowledge of disease and therapeutic regimen to maintain health will improve  Short Term Goals: Ability to identify and develop effective coping behaviors will improve and Compliance with prescribed medications will improve  Medication Management: RN will administer medications as ordered by provider, will assess and  evaluate patient's response and provide education to patient for prescribed medication. RN will report any adverse and/or side effects to prescribing provider.  Therapeutic Interventions: 1 on 1 counseling sessions, Psychoeducation, Medication administration, Evaluate responses to treatment, Monitor vital signs and CBGs as ordered, Perform/monitor CIWA, COWS, AIMS and Fall Risk screenings as ordered, Perform wound care treatments as ordered.  Evaluation of Outcomes: Progressing   LCSW Treatment Plan for Primary Diagnosis: MDD (major depressive disorder), recurrent, severe, with psychosis ([place]) Long Term Goal(s): Safe transition to appropriate next level of care at discharge, Engage patient in therapeutic group addressing interpersonal concerns.  Short Term Goals: Engage patient in aftercare planning with referrals and resources  Therapeutic Interventions: Assess for all discharge needs, 1 to 1 time with Social worker, Explore available resources and support systems, Assess for adequacy in community support network, Educate family and significant other(s) on suicide prevention, Complete Psychosocial Assessment, Interpersonal group therapy.  Evaluation of Outcomes: Met  Return home, follow up outpt   Progress in Treatment: Attending groups: No Participating in groups: No Taking medication as prescribed: Yes Toleration medication: Yes, no side effects reported at this time Family/Significant other contact made: No Patient understands diagnosis: No Limited insight Discussing patient identified problems/goals with staff: Yes Medical problems stabilized or resolved: Yes Denies suicidal/homicidal ideation: Yes Issues/concerns per patient self-inventory: None Other:  N/A  New problem(s) identified: None identified at this time.   New Short Term/Long Term Goal(s): "I have no goals."   Discharge Plan or Barriers:   Reason for Continuation of Hospitalization:  Depression Medication  stabilization Suicidal ideation   Estimated Length of Stay: 9/4  Attendees: Patient: [name] [name] 02/26/2018  8:34 AM  Physician: Maris Berger, MD 02/26/2018  8:34 AM  Nursing: Phillis Haggis RN 02/26/2018  8:34 AM  RN Care Manager: Lars Pinks, RN 02/26/2018  8:34 AM  Social Worker: Ripley Fraise 02/26/2018  8:34 AM  Recreational Therapist: Winfield Cunas 02/26/2018  8:34 AM  Other: Norberto Sorenson 02/26/2018  8:34 AM  Other:  02/26/2018  8:34 AM    Scribe for Treatment Team:  Roque Lias LCSW 02/26/2018 8:34 AM

## 2018-02-27 DIAGNOSIS — Z590 Homelessness: Secondary | ICD-10-CM

## 2018-02-27 DIAGNOSIS — Z818 Family history of other mental and behavioral disorders: Secondary | ICD-10-CM

## 2018-02-27 LAB — GLUCOSE, CAPILLARY
GLUCOSE-CAPILLARY: 170 mg/dL — AB (ref 70–99)
GLUCOSE-CAPILLARY: 312 mg/dL — AB (ref 70–99)
GLUCOSE-CAPILLARY: 379 mg/dL — AB (ref 70–99)
GLUCOSE-CAPILLARY: 222 mg/dL — AB (ref 70–99)

## 2018-02-27 MED ORDER — ESCITALOPRAM OXALATE 5 MG PO TABS
5.0000 mg | ORAL_TABLET | Freq: Every day | ORAL | Status: DC
  Administered 2018-02-27 – 2018-03-03 (×3): 5 mg via ORAL
  Filled 2018-02-27 (×10): qty 1

## 2018-02-27 NOTE — Progress Notes (Signed)
Writer has observed patient up and about on the unit. He appears anxious and restless. Writer spoke with him concerning his medications scheduled and available if needed and he reported at beginning of shift that he did not want any sleep medication. He later came back after dayroom closed and reported that he changed his mind. He attended group and has been walking the hallway or either at the front desk talking with the Diplomatic Services operational officer. He reports that his goal is to find a place to stay before he is discharged. Support given and safety maintained on unit with 15 min checks.  NP was notified of patients blood sugar and no orders received to give insulin. Patient has received insulins at 5pm for blood sugar. Will check blood sugar in am.

## 2018-02-27 NOTE — Progress Notes (Signed)
[facility] MD Progress Note  02/27/2018 11:37 AM [name] [name] xxxVarela [name].  MRN:  [id number]  Evaluation:Deen observed pacing the unit.  He is alert oriented x3.  Reports irritability and agitation related to bed assignment.  Reports he would feel better programming on 300 hall as patient is well-known to behavioral health.  Currently reporting passive suicidal ideations however is able to contract for safety.  Patient ruminates with the relationship between he and his father.  Rates his depression 5 out of 10 with 10 being the worst.  Reports taking medications as prescribed and tolerating them well.  Support encouragement reassurance was provided.  Discussed with [facility] transferring patient to the 400 hall. (Mood disorders) support encouragement reassurance was provided  [name] [name] is an 17 y/o M with history of MDD, personality disorder, ADHD, (and elsewhere schizophrenia), who was admitted from [facility] on IVC initiated at [facility] after pt was at his outpatient follow up appointment and verbalized suicidal ideation with plan and worsening depression. Pt was medically cleared at [facility] and then transferred to [facility] for additional treatment and stabilization.   Principal Problem: MDD (major depressive disorder), recurrent, severe, with psychosis (HCC) Diagnosis:   Patient Active Problem List   Diagnosis Date Noted  . Borderline personality disorder (HCC) [F60.3] 02/26/2018  . MDD (major depressive disorder), recurrent, severe, with psychosis (HCC) [F33.3] 02/25/2018  . Major depressive disorder, recurrent severe without psychotic features (HCC) [F33.2] 07/24/2017  . DKA, type 1 (HCC) [E10.10] 07/16/2017  . Nausea & vomiting [R11.2] 07/16/2017  . Uncontrolled type 1 diabetes circulatory disorder erectile dysfunction (HCC) [E10.59, N52.1, E10.65]   . DKA (diabetic ketoacidoses) (HCC) [E13.10] 12/18/2016  . History of seizures [Z87.898] 12/01/2016  . History of migraine [Z86.69] 12/01/2016  . MDD (major  depressive disorder), recurrent severe, without psychosis (HCC) [F33.2] 03/11/2015  . Disordered eating [F50.9] 03/08/2015  . Ketonuria [R82.4]   . Adjustment reaction to medical therapy [F43.20]   . Non compliance w medication regimen [Z91.14]   . 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 Dehydration [E86.0] 08/22/2014  . Hyperglycemia due to type 1 diabetes mellitus (HCC) [E10.65] 08/21/2014  . Type 1 diabetes mellitus with hyperglycemia (HCC) [E10.65]   . Noncompliance [Z91.19]   . Generalized abdominal pain [R10.84]   . Glycosuria [R81]   . Depression [F32.9] 07/12/2014  . Involuntary movements [R25.9] 06/13/2014  . Bilateral leg pain [M79.604, M79.605] 06/13/2014  . 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 Suicidal ideation [R45.851] 03/31/2014  . ODD (oppositional defiant disorder) [F91.3] 03/03/2014  . MDD (major depressive disorder), recurrent episode, moderate (HCC) [F33.1] 02/16/2014  . 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 Microcytic anemia [D50.9] 02/15/2014  . Hyperglycemia [R73.9] 02/14/2014  . Goiter [E04.9] 02/04/2014  . 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 Acquired acanthosis nigricans [L83] 02/04/2014  . Obesity, morbid (HCC) [E66.01] 02/04/2014  . Insulin resistance [E88.81] 02/04/2014  . 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 Hypoglycemia associated with diabetes (HCC) [E11.649] 02/02/2014  . Maladaptive health behaviors affecting medical condition [F54] 02/02/2014  . Hypoglycemia [E16.2] 02/02/2014  . Type 1 diabetes mellitus in patient 29 to 19 years of age with hemoglobin A1c goal of less than 7.5% (HCC) [E10.9] 01/26/2014  . 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 Migraine without aura [G43.009] 12/13/2013  .  Body mass index, pediatric, greater than or equal to 95th percentile for age [facility] 12/13/2013  . Asthma [J45.909] 12/13/2013  . Hypoglycemia unawareness in type 1 diabetes mellitus (HCC) [E10.649] 08/08/2013  . Seizure disorder (HCC) [G40.909] 07/06/2013  . Short-term memory loss [R41.3] 07/06/2013  . Sickle cell trait (HCC) [D57.3] 07/06/2013  . Diabetes (HCC) [E11.9] 06/17/2013  . Diabetes mellitus, new onset (HCC) [E11.9] 06/17/2013   Total Time spent with patient: 20 minutes  Past Psychiatric History:   Past Medical History:  Past Medical History:  Diagnosis Date  . ADD (attention deficit disorder)   . Allergy   . Anxiety   . Asthma   . Depression   . Epileptic seizure (HCC)    "both petit and grand mal; last sz ~ 2 wk ago" (06/17/2013)  . Headache(784.0)    "2-3 times/wk usually" (06/17/2013)  . Heart murmur    "heard a slight one earlier today" (06/17/2013)  . Migraine    "maybe once/month; it's severe" (06/17/2013)  . ODD (oppositional defiant disorder)   . Sickle cell trait (HCC)   . Type I diabetes mellitus (HCC)    "that's what they're thinking now" (06/17/2013)  . Vision abnormalities    "takes him longer to focus cause; from his sz" (06/17/2013)    Past Surgical History:  Procedure Laterality Date  . CIRCUMCISION  2000  . FINGER SURGERY Left 2001   "crushed pinky; had to repair it" (06/17/2013)   Family History:  Family History  Problem Relation Age of Onset  . Asthma Mother   . COPD Mother   . Other Mother        possible autoimmune, unclear  . Diabetes Maternal Grandmother   . Heart disease Maternal Grandmother   . Hypertension Maternal Grandmother   . Mental illness Maternal Grandmother   . Heart disease Maternal Grandfather   . Hyperlipidemia Paternal Grandmother   . Hyperlipidemia Paternal Grandfather   . Migraines Sister        Hemiplegic Migraines     Family Psychiatric  History:  Social History:  Social History   Substance and Sexual Activity  Alcohol Use No  . Alcohol/week: 0.0 standard drinks     Social History   Substance and Sexual Activity  Drug Use No    Social History   Socioeconomic History  . Marital status: Single    Spouse name: Not on file  . Number of children: Not on file  . Years of education: Not on file  . Highest education level: Not on file  Occupational History  . Not on file  Social Needs  . Financial resource strain: Not on file  . Food insecurity:    Worry: Not on file    Inability: Not on file  . Transportation needs:    Medical: Not on file    Non-medical: Not on file  Tobacco Use  . Smoking status: Passive Smoke Exposure - Never Smoker  . Smokeless tobacco: Never Used  . Tobacco comment: Mom and dad smoke outside   Substance and Sexual Activity  . Alcohol use: No    Alcohol/week: 0.0 standard drinks  . Drug use: No  . Sexual activity: Never  Lifestyle  . Physical activity:    Days per week: Not on file    Minutes per session: Not on file  . Stress: Not on file  Relationships  . Social connections:    Talks on phone: Not on file    Gets together: Not on file  Attends religious service: Not on file    Active member of club or organization: Not on file    Attends meetings of clubs or organizations: Not on file    Relationship status: Not on file  Other Topics Concern  . Not on file  Social History Narrative   Lives at home with father, stepmother, 63 year old brother.   Additional Social History:                         Sleep: Fair  Appetite:  Fair  Current Medications: Current Facility-Administered Medications  Medication Dose Route Frequency Provider Last Rate Last Dose  . acetaminophen (TYLENOL) tablet 650 mg  650 mg Oral Q6H PRN Money, Gerlene Burdock, FNP   650 mg at 02/26/18 2009  . benztropine (COGENTIN) tablet 1 mg  1 mg Oral BID PRN Micheal Likens, MD       Or  . benztropine mesylate (COGENTIN) injection 1 mg  1 mg Intramuscular BID PRN Micheal Likens, MD      . chlorproMAZINE (THORAZINE) tablet 50 mg  50 mg Oral Q6H PRN Micheal Likens, MD       Or  . chlorproMAZINE (THORAZINE) injection 50 mg  50 mg Intramuscular Q6H PRN Micheal Likens, MD      . chlorproMAZINE (THORAZINE) tablet 50 mg  50 mg Oral BID Micheal Likens, MD   50 mg at 02/26/18 1703  . hydrOXYzine (ATARAX/VISTARIL) tablet 50 mg  50 mg Oral Q6H PRN Micheal Likens, MD   50 mg at 02/26/18 2009  . insulin aspart (novoLOG) injection 0-15 Units  0-15 Units Subcutaneous TID WC Rankin, [name], NP   3 Units at 02/27/18 0644  . insulin aspart (novoLOG) injection 8 Units  8 Units Subcutaneous TID WC Rankin, [name], NP   8 Units at 02/27/18 0645  . insulin glargine (LANTUS) injection 30 Units  30 Units Subcutaneous BID Rankin, [name], NP   30 Units at 02/27/18 0805  . LORazepam (ATIVAN) tablet 1 mg  1 mg Oral Q6H PRN Micheal Likens, MD       Or  . LORazepam (ATIVAN) injection 1 mg  1 mg Intramuscular Q6H PRN Micheal Likens, MD      . nicotine (NICODERM CQ - dosed in mg/24 hours) patch 21 mg  21 mg Transdermal Daily Micheal Likens, MD   21 mg at 02/26/18 1454  . traZODone (DESYREL) tablet 100 mg  100 mg Oral QHS,MR X 1 [name] A, NP   100 mg at 02/26/18 2109    Lab Results:  Results for orders placed or performed during the hospital encounter of 02/25/18 (from the past 48 hour(s))  Glucose, capillary     Status: Abnormal   Collection Time: 02/25/18  4:49 PM  Result Value Ref Range   Glucose-Capillary 280 (H) 70 - 99 mg/dL   Comment 1 Notify RN    Comment 2 Document in Chart   Glucose, capillary     Status: Abnormal   Collection Time: 02/25/18  7:52 PM  Result Value Ref Range   Glucose-Capillary 275 (H) 70 - 99 mg/dL  Glucose, capillary     Status: Abnormal   Collection Time: 02/26/18   6:22 AM  Result Value Ref Range   Glucose-Capillary 239 (H) 70 - 99 mg/dL  Glucose, capillary     Status: Abnormal   Collection Time: 02/26/18 12:05 PM  Result  Value Ref Range   Glucose-Capillary 242 (H) 70 - 99 mg/dL  Glucose, capillary     Status: Abnormal   Collection Time: 02/26/18  4:41 PM  Result Value Ref Range   Glucose-Capillary 425 (H) 70 - 99 mg/dL   Comment 1 Notify RN    Comment 2 Document in Chart   Glucose, capillary     Status: Abnormal   Collection Time: 02/26/18  6:16 PM  Result Value Ref Range   Glucose-Capillary 337 (H) 70 - 99 mg/dL   Comment 1 Notify RN    Comment 2 Document in Chart   Glucose, capillary     Status: Abnormal   Collection Time: 02/26/18  8:33 PM  Result Value Ref Range   Glucose-Capillary 138 (H) 70 - 99 mg/dL  Glucose, capillary     Status: Abnormal   Collection Time: 02/27/18  6:23 AM  Result Value Ref Range   Glucose-Capillary 170 (H) 70 - 99 mg/dL    Blood Alcohol level:  Lab Results  Component Value Date   ETH <10 02/23/2018   ETH <10 08/14/2017    Metabolic Disorder Labs: Lab Results  Component Value Date   HGBA1C 10.1 (H) 01/26/2017   MPG 243 01/26/2017   MPG 263 12/18/2016   No results found for: PROLACTIN Lab Results  Component Value Date   TRIG 244 (H) 12/18/2016    Physical Findings: AIMS: Facial and Oral Movements Muscles of Facial Expression: None, normal Lips and Perioral Area: None, normal Jaw: None, normal Tongue: None, normal,Extremity Movements Upper (arms, wrists, hands, fingers): None, normal Lower (legs, knees, ankles, toes): None, normal, Trunk Movements Neck, shoulders, hips: None, normal, Overall Severity Severity of abnormal movements (highest score from questions above): None, normal Incapacitation due to abnormal movements: None, normal Patient's awareness of abnormal movements (rate only patient's report): No Awareness, Dental Status Current problems with teeth and/or dentures?: No Does  patient usually wear dentures?: No  CIWA:    COWS:     Musculoskeletal: Strength & Muscle Tone: within normal limits Gait & Station: normal Patient leans: N/A  Psychiatric Specialty Exam: Physical Exam  Constitutional: He is oriented to person, place, and time. He appears well-developed.  Neurological: He is alert and oriented to person, place, and time.  Psychiatric: He has a normal mood and affect. His behavior is normal.    Review of Systems  Psychiatric/Behavioral: Positive for depression and suicidal ideas. The patient does not have insomnia.   All other systems reviewed and are negative.   Blood pressure 129/82, pulse 89, temperature 99 F (37.2 C), temperature source Oral, resp. rate 18, height 6\' 3"  (1.905 m), weight 89.8 kg, SpO2 100 %.Body mass index is 24.75 kg/m.  General Appearance: Casual  Eye Contact:  Fair  Speech:  Clear and Coherent  Volume:  Normal  Mood:  Anxious and Depressed  Affect:  Depressed  Thought Process:  Coherent  Orientation:  Full (Time, Place, and Person)  Thought Content:  Logical  Suicidal Thoughts:  Yes.  without intent/plan  Homicidal Thoughts:  No  Memory:  Immediate;   Fair Remote;   Fair  Judgement:  Fair  Insight:  Fair  Psychomotor Activity:  Normal  Concentration:  Concentration: Fair  Recall:  [employer] of Knowledge:  Fair  Language:  Fair  Akathisia:  No  Handed:  Right  AIMS (if indicated):     Assets:  Communication Skills Desire for Improvement Resilience Social Support  ADL's:  Intact  Cognition:  WNL  Sleep:  Number of Hours: 6.75     Treatment Plan Summary: Daily contact with patient to assess and evaluate symptoms and progress in treatment and Medication management   Continue with current treatment plan on 02/27/2018 as listed below except were noted.   Mood stabilization:             Continue with Thorazine  50 mg BID            Restart Lexapro 5  mg po Q day   Insomnia  Continue with Trazodone 100  mg  Dm:              Continue sliding scale novolog             Continue Lantus 30 units BID             Encouraged carb counting as was suggested by patient   Will continue to monitor vitals ,medication compliance and treatment side effects while patient is here.  CSW will continue working on disposition and patient to consider group home placement .  Patient to participate in therapeutic milieu  Oneta Rack, NP 02/27/2018, 11:37 AM

## 2018-02-27 NOTE — Progress Notes (Signed)
Patient ID: [name] [name] xxxVarela [name]., male   DOB: 05-18-2001, 17 y.o.   MRN: [id number] DAR Note: Pt remained childlike. Complained of moderate anxiety, agitation and moderate headache; "I told the [facility] that I couldn't focus on this unit but she wouldn't listen; she wants me to too talk to the doctor." Pt observed pacing the hallway. Pt denied depression, SI, HI or AVH. Pt was med compliant. All patient's questions and concerns addressed. Support, encouragement, and safe environment provided. 15-minute safety checks continue.  Pt did attend wrap-up group.

## 2018-02-27 NOTE — Progress Notes (Signed)
Patient moved to room 407 bed 1

## 2018-02-27 NOTE — Progress Notes (Signed)
D. Pt presents as somewhat childlike and animated- friendly during interactions. Pt isolated to room for most of the morning and did not attend group led by SW. Pt currently denies SI/HI and AV hallucinations. A. Labs and vitals monitored. Pt compliant with insulin- but refused am thorazine, stating,"I don't want it. It's just going to make me sleepy". . Pt supported emotionally and encouraged to express concerns and ask questions.   R. Pt remains safe with 15 minute checks. Will continue POC.

## 2018-02-27 NOTE — [facility] Group Notes (Signed)
[facility] Group Notes:  Nursing Psychoeducational Group  Date:  02/27/2018  Time:  1:00 PM  Group: Life Skills  Facilitator: Patty D. RN  Type of Therapy:  Psychoeducational Skills  Participation Level:  Did Not Attend  Comment: Patient was invited but declined to attend group.  Bubba Vanbenschoten A Valory Wetherby 02/27/2018, 2:00 PM 

## 2018-02-27 NOTE — [facility] Group Notes (Signed)
[facility] Group Notes: (Clinical Social Work)   02/27/2018      Type of Therapy:  Group Therapy   Participation Level:  Did Not Attend despite MHT prompting   Ambrose Mantle, LCSW 02/27/2018, 12:14 PM

## 2018-02-28 DIAGNOSIS — F603 Borderline personality disorder: Secondary | ICD-10-CM

## 2018-02-28 LAB — GLUCOSE, CAPILLARY
GLUCOSE-CAPILLARY: 322 mg/dL — AB (ref 70–99)
GLUCOSE-CAPILLARY: 191 mg/dL — AB (ref 70–99)
Glucose-Capillary: 217 mg/dL — ABNORMAL HIGH (ref 70–99)
Glucose-Capillary: 403 mg/dL — ABNORMAL HIGH (ref 70–99)
Glucose-Capillary: 449 mg/dL — ABNORMAL HIGH (ref 70–99)
Glucose-Capillary: 213 mg/dL — ABNORMAL HIGH (ref 70–99)

## 2018-02-28 MED ORDER — INSULIN ASPART 100 UNIT/ML ~~LOC~~ SOLN
10.0000 [IU] | Freq: Once | SUBCUTANEOUS | Status: AC
  Administered 2018-02-28: 10 [IU] via SUBCUTANEOUS

## 2018-02-28 MED ORDER — INSULIN GLARGINE 100 UNIT/ML ~~LOC~~ SOLN
35.0000 [IU] | Freq: Two times a day (BID) | SUBCUTANEOUS | Status: DC
  Administered 2018-02-28 – 2018-03-09 (×16): 35 [IU] via SUBCUTANEOUS
  Filled 2018-02-28 (×2): qty 0.35

## 2018-02-28 NOTE — Progress Notes (Signed)
CBG @ 449 postprandial. Pt continues to be asymptomatic and is attending group. Dr notified- 10 units to be administered following group.

## 2018-02-28 NOTE — [facility] Group Notes (Signed)
[facility] LCSW Group Therapy Note  02/28/2018  10:00-11:00AM  Type of Therapy and Topic:  Group Therapy:  Being Your Own Support  Participation Level:  None   Description of Group:  Patients in this group were introduced to the concept that self-support is an essential part of recovery.  A song entitled "My Own Hero" was played and a group discussion ensued in which patients stated they could relate to the song and it inspired them to realize they have be willing to help themselves in order to succeed, because other people cannot achieve sobriety or stability for them.  We discussed adding a variety of healthy supports to address the various needs in their lives.  A song was played called "I Know Where I've Been" toward the end of group and used to conduct an inspirational wrap-up to group of remembering how far they have already come in their journey.  Therapeutic Goals: 1)  demonstrate the importance of being a part of one's own support system 2)  discuss reasons people in one's life may eventually be unable to be continually supportive  3)  identify the patient's current support system and   4)  elicit commitments to add healthy supports and to become more conscious of being self-supportive   Summary of Patient Progress:  The patient expressed nothing during group and at times seemed to listen, at other times seemed not to be listening at all.   Therapeutic Modalities:   Motivational Interviewing Activity  Lynnell Chad

## 2018-02-28 NOTE — Progress Notes (Signed)
Writer has observed patient up in the dayroom and walking the hallway a lot tonight. Writer spoke with him 1:1 earlier about his blood sugar being high on day shift and he reported that he only eats one meal a day to control his blood sugars. Writer spoke with staff that have observed him in the am for breakfast and it was reported that he is eating. Writer encouraged him to make healthy eating choices so it won't affect his blood sugars. Patient has been very child like in his behavior on the unit, saying or doing things to bring attention to himself. He has been easily redirectable but has had a verbal altercation with the mht on the hall, being disrespectful towards her. Writer addressed this issue with him and he was receptive. Safety maintained on unit with 15 min checks.

## 2018-02-28 NOTE — Progress Notes (Signed)
CBG @ 403 (15 U + 8 U Novolog insulin administered) Pt assymptomatic Dr notified-  CBG to be rechecked  in one hour

## 2018-02-28 NOTE — Progress Notes (Signed)
D. Pt presents with a sad affect/mood-- cooperative, somewhat childlike behavior. As reported by MHT, pt crumbled up his self inventory paper this morning and began cursing, disrespecting staff. Spoke with pt 1:1 regarding this incident, in which pt replied, " I can't read that well. They gave me that sheet of paper and it made me mad". Pt reminded of appropriate ways to handle frustration and encouraged pt to talk to staff before acting out. Pt currently denies SI/HI and AV hallucinations A. Labs and vitals monitored. Pt compliant with medications. Pt supported emotionally and encouraged to express concerns and ask questions.   R. Pt remains safe with 15 minute checks. Will continue POC.

## 2018-02-28 NOTE — Progress Notes (Signed)
CBG: 322 

## 2018-02-28 NOTE — [facility] Group Notes (Signed)
[facility] Group Notes:  Nursing Psychoeducational Group  Date:  02/28/2018  Time:  1:00 PM  Group: Life Skills  Facilitator: Patty D. RN  Type of Therapy:  Psychoeducational Skills  Participation Level:  Did Not Attend  Comment: Patient was invited but declined to attend group.  Tahra Hitzeman A Nadir Vasques 02/28/18, 2:00 PM 
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
## 2018-03-01 LAB — GLUCOSE, CAPILLARY
GLUCOSE-CAPILLARY: 277 mg/dL — AB (ref 70–99)
Glucose-Capillary: 305 mg/dL — ABNORMAL HIGH (ref 70–99)
Glucose-Capillary: 284 mg/dL — ABNORMAL HIGH (ref 70–99)
Glucose-Capillary: 185 mg/dL — ABNORMAL HIGH (ref 70–99)

## 2018-03-01 NOTE — [facility] Suicide Risk Assessment (Signed)
[facility] INPATIENT:  Family/Significant Other Suicide Prevention Education  Suicide Prevention Education:  Patient Refusal for Family/Significant Other Suicide Prevention Education: The patient [name] [name] xxxVarela [name]. has refused to provide written consent for family/significant other to be provided Family/Significant Other Suicide Prevention Education during admission and/or prior to discharge.  Physician notified.  Baldo Daub [facility] 03/01/2018, 10:30 AM

## 2018-03-01 NOTE — Progress Notes (Addendum)
Nursing Progress Note: 7p-7a D: Pt currently presents with a irritable/labile/electively mute affect and behavior. Not interacting with the milieu. Pt reports good sleep during the previous night with current medication regimen. Pt did not attend wrap-up group.  A: Pt refused medications per providers orders. Pt's labs and vitals were monitored throughout the night. Pt supported emotionally and encouraged to express concerns and questions. Pt educated on medications.  R: Pt's safety ensured with 15 minute and environmental checks. Pt currently denies SI, HI, and AVH. Pt verbally contracts to seek staff if SI,HI, or AVH occurs and to consult with staff before acting on any harmful thoughts. Will continue to monitor.

## 2018-03-01 NOTE — Progress Notes (Signed)
Recreation Therapy Notes  Date: 03/01/18 Time: 0930 Location: 300 Hall Dayroom  Group Topic: Stress Management  Goal Area(s) Addresses:  Patient will verbalize importance of using healthy stress management.  Patient will identify positive emotions associated with healthy stress management.   Intervention: Stress Management  Activity :  Meditation.  LRT introduced the stress management technique of meditation.  Patients were to listen and follow along as the meditation played in order to relax.  Education:  Stress Management, Discharge Planning.   Education Outcome: Acknowledges edcuation/In group clarification offered/Needs additional education  Clinical Observations/Feedback: Pt did not attend group.     Caroll Rancher, LRT/CTRS         Caroll Rancher A 03/01/2018 12:25 PM

## 2018-03-01 NOTE — Progress Notes (Signed)
Patient is inappropriate with the male patients on the hall.  He asked for the phone number of one male that is being discharged and she refused and told staff.  Dr. Jola Babinski requested that patient be moved to 500 ASAP.  Patient refused all his medications.  He did agree to take his Lantus.  He refused his vital signs also.  He is labile and uncooperative with staff.

## 2018-03-01 NOTE — Progress Notes (Signed)
[facility] MD Progress Note  03/01/2018 2:28 PM [name] [name] xxxVarela Jr.  MRN:  [id number] Subjective:    [name] [name] is an 17 y/o M with history of MDD, personality disorder, ADHD, (and elsewhere schizophrenia), who was admitted from [facility] on IVC initiated at [facility] after pt was at his outpatient follow up appointment and verbalized suicidal ideation with plan and worsening depression. Pt was medically cleared at [facility] and then transferred to [facility] for additional treatment and stabilization. He was restarted on previous medication of thorazine, and then he was also started on trial of lexapro. Pt has been reporting incremental improvement of his presenting symptoms.  Today upon evaluation, pt is irritable and he perseverates on frustrations with being moved from the 300 hall to the 500 hall. He states, "I'm fine, I guess." Reviewed with patient that expectations in the hospital are that he is focusing on his own care, and that he will be able to receive equivalent programming and care on the 500 unit. Pt denies physical complaints. He is tolerating his medications adequately. He denies SI/HI/AH/VH. Pt explains that he cannot return to staying with his father, so we discussed investigating alternative options with his probation officer and SW team. Pt is in agreement to continue his current regimen without changes today.  Principal Problem: MDD (major depressive disorder), recurrent, severe, with psychosis (HCC) Diagnosis:   Patient Active Problem List   Diagnosis Date Noted  . Borderline personality disorder (HCC) [F60.3] 02/26/2018  . MDD (major depressive disorder), recurrent, severe, with psychosis (HCC) [F33.3] 02/25/2018  . Major depressive disorder, recurrent severe without psychotic features (HCC) [F33.2] 07/24/2017  . DKA, type 1 (HCC) [E10.10] 07/16/2017  . Nausea & vomiting [R11.2] 07/16/2017  . Uncontrolled type 1 diabetes circulatory disorder erectile dysfunction (HCC) [E10.59, N52.1, E10.65]    . DKA (diabetic ketoacidoses) (HCC) [E13.10] 12/18/2016  . History of seizures [Z87.898] 12/01/2016  . History of migraine [Z86.69] 12/01/2016  . MDD (major depressive disorder), recurrent severe, without psychosis (HCC) [F33.2] 03/11/2015  . Disordered eating [F50.9] 03/08/2015  . Ketonuria [R82.4]   . Adjustment reaction to medical therapy [F43.20]   . Non compliance w medication regimen [Z91.14]   . 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 Dehydration [E86.0] 08/22/2014  . Hyperglycemia due to type 1 diabetes mellitus (HCC) [E10.65] 08/21/2014  . Type 1 diabetes mellitus with hyperglycemia (HCC) [E10.65]   . Noncompliance [Z91.19]   . Generalized abdominal pain [R10.84]   . Glycosuria [R81]   . Depression [F32.9] 07/12/2014  . Involuntary movements [R25.9] 06/13/2014  . Bilateral leg pain [M79.604, M79.605] 06/13/2014  . 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 Suicidal ideation [R45.851] 03/31/2014  . ODD (oppositional defiant disorder) [F91.3] 03/03/2014  . MDD (major depressive disorder), recurrent episode, moderate (HCC) [F33.1] 02/16/2014  . 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 Microcytic anemia [D50.9] 02/15/2014  . Hyperglycemia [R73.9] 02/14/2014  . Goiter [E04.9] 02/04/2014  . 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 Acquired acanthosis nigricans [L83] 02/04/2014  . Obesity, morbid (HCC) [E66.01] 02/04/2014  . Insulin resistance [E88.81] 02/04/2014  . 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 Hypoglycemia associated with diabetes (HCC) [E11.649] 02/02/2014  . Maladaptive health behaviors affecting medical condition [F54] 02/02/2014  . Hypoglycemia [E16.2] 02/02/2014  . Type 1 diabetes mellitus in patient 77 to 19 years of age with  hemoglobin A1c goal  of less than 7.5% (HCC) [E10.9] 01/26/2014  . 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 Migraine without aura [G43.009] 12/13/2013  . Body mass index, pediatric, greater than or equal to 95th percentile for age [name] 12/13/2013  . Asthma [J45.909] 12/13/2013  . Hypoglycemia unawareness in type 1 diabetes mellitus (HCC) [E10.649] 08/08/2013  . Seizure disorder (HCC) [G40.909] 07/06/2013  . Short-term memory loss [R41.3] 07/06/2013  . Sickle cell trait (HCC) [D57.3] 07/06/2013  . Diabetes (HCC) [E11.9] 06/17/2013  . Diabetes mellitus, new onset (HCC) [E11.9] 06/17/2013   Total Time spent with patient: 30 minutes  Past Psychiatric History: see H&P  Past Medical History:  Past Medical History:  Diagnosis Date  . ADD (attention deficit disorder)   . Allergy   . Anxiety   . Asthma   . Depression   . Epileptic seizure (HCC)    "both petit and grand mal; last sz ~ 2 wk ago" (06/17/2013)  . Headache(784.0)    "2-3 times/wk usually" (06/17/2013)  . Heart murmur    "heard a slight one earlier today" (06/17/2013)  . Migraine    "maybe once/month; it's severe" (06/17/2013)  . ODD (oppositional defiant disorder)   . Sickle cell trait (HCC)   . Type I diabetes mellitus (HCC)    "that's what they're thinking now" (06/17/2013)  . Vision abnormalities    "takes him longer to focus cause; from his sz" (06/17/2013)    Past Surgical History:  Procedure Laterality Date  . CIRCUMCISION  2000  . FINGER SURGERY Left 2001   "crushed pinky; had to repair it" (06/17/2013)   Family History:  Family History  Problem Relation Age of Onset  . Asthma Mother   . COPD Mother   . Other Mother        possible autoimmune, unclear  . Diabetes Maternal Grandmother   . Heart disease Maternal Grandmother   . Hypertension Maternal Grandmother   . Mental illness Maternal Grandmother    . Heart disease Maternal Grandfather   . Hyperlipidemia Paternal Grandmother   . Hyperlipidemia Paternal Grandfather   . Migraines Sister        Hemiplegic Migraines    Family Psychiatric  History: see H&P Social History:  Social History   Substance and Sexual Activity  Alcohol Use No  . Alcohol/week: 0.0 standard drinks     Social History   Substance and Sexual Activity  Drug Use No    Social History   Socioeconomic History  . Marital status: Single    Spouse name: Not on file  . Number of children: Not on file  . Years of education: Not on file  . Highest education level: Not on file  Occupational History  . Not on file  Social Needs  . Financial resource strain: Not on file  . Food insecurity:    Worry: Not on file    Inability: Not on file  . Transportation needs:    Medical: Not on file    Non-medical: Not on file  Tobacco Use  . Smoking status: Passive Smoke Exposure - Never Smoker  . Smokeless tobacco: Never Used  . Tobacco comment: Mom and dad smoke outside   Substance and Sexual Activity  . Alcohol use: No    Alcohol/week: 0.0 standard drinks  . Drug use: No  . Sexual activity: Never  Lifestyle  . Physical activity:    Days per week: Not on file  Minutes per session: Not on file  . Stress: Not on file  Relationships  . Social connections:    Talks on phone: Not on file    Gets together: Not on file    Attends religious service: Not on file    Active member of club or organization: Not on file    Attends meetings of clubs or organizations: Not on file    Relationship status: Not on file  Other Topics Concern  . Not on file  Social History Narrative   Lives at home with father, stepmother, 11 year old brother.   Additional Social History:                         Sleep: Good  Appetite:  Good  Current Medications: Current Facility-Administered Medications  Medication Dose Route Frequency Provider Last Rate Last Dose  .  acetaminophen (TYLENOL) tablet 650 mg  650 mg Oral Q6H PRN Money, Gerlene Burdock, FNP   650 mg at 02/26/18 2009  . benztropine (COGENTIN) tablet 1 mg  1 mg Oral BID PRN Micheal Likens, MD       Or  . benztropine mesylate (COGENTIN) injection 1 mg  1 mg Intramuscular BID PRN Micheal Likens, MD      . chlorproMAZINE (THORAZINE) tablet 50 mg  50 mg Oral Q6H PRN Micheal Likens, MD   50 mg at 03/01/18 1403   Or  . chlorproMAZINE (THORAZINE) injection 50 mg  50 mg Intramuscular Q6H PRN Micheal Likens, MD      . chlorproMAZINE (THORAZINE) tablet 50 mg  50 mg Oral BID Micheal Likens, MD   50 mg at 02/26/18 1703  . escitalopram (LEXAPRO) tablet 5 mg  5 mg Oral Daily Oneta Rack, NP   5 mg at 02/28/18 0754  . hydrOXYzine (ATARAX/VISTARIL) tablet 50 mg  50 mg Oral Q6H PRN Micheal Likens, MD   50 mg at 03/01/18 1403  . insulin aspart (novoLOG) injection 0-15 Units  0-15 Units Subcutaneous TID WC Rankin, [name], NP   8 Units at 03/01/18 1213  . insulin aspart (novoLOG) injection 8 Units  8 Units Subcutaneous TID WC Rankin, [name], NP   8 Units at 03/01/18 1214  . insulin glargine (LANTUS) injection 35 Units  35 Units Subcutaneous BID Antonieta Pert, MD   35 Units at 03/01/18 [phone]  . LORazepam (ATIVAN) tablet 1 mg  1 mg Oral Q6H PRN Micheal Likens, MD   1 mg at 03/01/18 1403   Or  . LORazepam (ATIVAN) injection 1 mg  1 mg Intramuscular Q6H PRN Micheal Likens, MD      . nicotine (NICODERM CQ - dosed in mg/24 hours) patch 21 mg  21 mg Transdermal Daily Micheal Likens, MD   21 mg at 02/26/18 1454  . traZODone (DESYREL) tablet 100 mg  100 mg Oral QHS,MR X 1 [name] A, NP   100 mg at 02/28/18 2307    Lab Results:  Results for orders placed or performed during the hospital encounter of 02/25/18 (from the past 48 hour(s))  Glucose, capillary     Status: Abnormal   Collection Time: 02/27/18  5:04 PM  Result Value  Ref Range   Glucose-Capillary 379 (H) 70 - 99 mg/dL   Comment 1 Notify RN   Glucose, capillary     Status: Abnormal   Collection Time: 02/27/18  9:22 PM  Result Value Ref  Range   Glucose-Capillary 222 (H) 70 - 99 mg/dL  Glucose, capillary     Status: Abnormal   Collection Time: 02/28/18  5:52 AM  Result Value Ref Range   Glucose-Capillary 217 (H) 70 - 99 mg/dL  Glucose, capillary     Status: Abnormal   Collection Time: 02/28/18 12:16 PM  Result Value Ref Range   Glucose-Capillary 403 (H) 70 - 99 mg/dL   Comment 1 Notify RN    Comment 2 Document in Chart   Glucose, capillary     Status: Abnormal   Collection Time: 02/28/18  1:10 PM  Result Value Ref Range   Glucose-Capillary 449 (H) 70 - 99 mg/dL   Comment 1 Notify RN   Glucose, capillary     Status: Abnormal   Collection Time: 02/28/18  4:01 PM  Result Value Ref Range   Glucose-Capillary 322 (H) 70 - 99 mg/dL  Glucose, capillary     Status: Abnormal   Collection Time: 02/28/18  5:07 PM  Result Value Ref Range   Glucose-Capillary 213 (H) 70 - 99 mg/dL   Comment 1 Notify RN    Comment 2 Document in Chart   Glucose, capillary     Status: Abnormal   Collection Time: 02/28/18  8:58 PM  Result Value Ref Range   Glucose-Capillary 191 (H) 70 - 99 mg/dL  Glucose, capillary     Status: Abnormal   Collection Time: 03/01/18  6:09 AM  Result Value Ref Range   Glucose-Capillary 305 (H) 70 - 99 mg/dL  Glucose, capillary     Status: Abnormal   Collection Time: 03/01/18 12:00 PM  Result Value Ref Range   Glucose-Capillary 284 (H) 70 - 99 mg/dL    Blood Alcohol level:  Lab Results  Component Value Date   ETH <10 02/23/2018   ETH <10 08/14/2017    Metabolic Disorder Labs: Lab Results  Component Value Date   HGBA1C 10.1 (H) 01/26/2017   MPG 243 01/26/2017   MPG 263 12/18/2016   No results found for: PROLACTIN Lab Results  Component Value Date   TRIG 244 (H) 12/18/2016    Physical Findings: AIMS: Facial and Oral  Movements Muscles of Facial Expression: None, normal Lips and Perioral Area: None, normal Jaw: None, normal Tongue: None, normal,Extremity Movements Upper (arms, wrists, hands, fingers): None, normal Lower (legs, knees, ankles, toes): None, normal, Trunk Movements Neck, shoulders, hips: None, normal, Overall Severity Severity of abnormal movements (highest score from questions above): None, normal Incapacitation due to abnormal movements: None, normal Patient's awareness of abnormal movements (rate only patient's report): No Awareness, Dental Status Current problems with teeth and/or dentures?: No Does patient usually wear dentures?: No  CIWA:    COWS:     Musculoskeletal: Strength & Muscle Tone: within normal limits Gait & Station: normal Patient leans: N/A  Psychiatric Specialty Exam: Physical Exam  Nursing note and vitals reviewed.   Review of Systems  Constitutional: Negative for chills and fever.  Respiratory: Negative for cough and shortness of breath.   Cardiovascular: Negative for chest pain.  Gastrointestinal: Negative for abdominal pain, heartburn, nausea and vomiting.  Psychiatric/Behavioral: Positive for depression. Negative for hallucinations and suicidal ideas. The patient is nervous/anxious. The patient does not have insomnia.     Blood pressure 129/82, pulse 89, temperature 99 F (37.2 C), temperature source Oral, resp. rate 18, height 6\' 3"  (1.905 m), weight 89.8 kg, SpO2 100 %.Body mass index is 24.75 kg/m.  General Appearance: Casual and Fairly Groomed  Eye Contact:  Good  Speech:  Clear and Coherent and Normal Rate  Volume:  Normal  Mood:  Irritable  Affect:  Appropriate, Congruent and Constricted  Thought Process:  Coherent and Goal Directed  Orientation:  Full (Time, Place, and Person)  Thought Content:  Logical  Suicidal Thoughts:  No  Homicidal Thoughts:  No  Memory:  Immediate;   Fair Recent;   Fair Remote;   Fair  Judgement:  Fair  Insight:   Fair  Psychomotor Activity:  Normal  Concentration:  Concentration: Fair  Recall:  [employer] of Knowledge:  Fair  Language:  Fair  Akathisia:  No  Handed:    AIMS (if indicated):     Assets:  Resilience Social Support  ADL's:  Intact  Cognition:  WNL  Sleep:  Number of Hours: 5.5   Treatment Plan Summary: Daily contact with patient to assess and evaluate symptoms and progress in treatment and Medication management   Continue with current treatment plan on 03/01/2018 as listed below except were noted.  Mood stabilization: -Continue Thorazine 50 mg BID   -Continue Lexapro 5  mg po Q day  Insomnia             Continue with Trazodone 100 mg  Dm:  Continue sliding scale novolog Continue Lantus 35 units BID Encouraged carb counting as was suggested by patient   Will continue to monitor vitals ,medication compliance and treatment side effects while patient is here.  CSW will continue working on dispositionand patient to consider group home placement.  Patient to participate in therapeutic milieu  Micheal Likens, MD 03/01/2018, 2:28 PM
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
## 2018-03-02 DIAGNOSIS — F419 Anxiety disorder, unspecified: Secondary | ICD-10-CM

## 2018-03-02 DIAGNOSIS — E119 Type 2 diabetes mellitus without complications: Secondary | ICD-10-CM

## 2018-03-02 DIAGNOSIS — G4700 Insomnia, unspecified: Secondary | ICD-10-CM

## 2018-03-02 LAB — GLUCOSE, CAPILLARY
GLUCOSE-CAPILLARY: 346 mg/dL — AB (ref 70–99)
GLUCOSE-CAPILLARY: 223 mg/dL — AB (ref 70–99)
GLUCOSE-CAPILLARY: 387 mg/dL — AB (ref 70–99)
Glucose-Capillary: 216 mg/dL — ABNORMAL HIGH (ref 70–99)

## 2018-03-02 NOTE — [facility] Group Notes (Signed)
LCSW Group Therapy Note   03/02/2018 1:15pm   Type of Therapy and Topic:  Group Therapy:  Overcoming Obstacles   Participation Level:  Minimal   Description of Group:    In this group patients will be encouraged to explore what they see as obstacles to their own wellness and recovery. They will be guided to discuss their thoughts, feelings, and behaviors related to these obstacles. The group will process together ways to cope with barriers, with attention given to specific choices patients can make. Each patient will be challenged to identify changes they are motivated to make in order to overcome their obstacles. This group will be process-oriented, with patients participating in exploration of their own experiences as well as giving and receiving support and challenge from other group members.   Therapeutic Goals: 1. Patient will identify personal and current obstacles as they relate to admission. 2. Patient will identify barriers that currently interfere with their wellness or overcoming obstacles.  3. Patient will identify feelings, thought process and behaviors related to these barriers. 4. Patient will identify two changes they are willing to make to overcome these obstacles:      Summary of Patient Progress   Present initially, but only because I told him I would not see him individually if he did not come.  Stayed 10 minutes, answered "I don't know" to everything.  Left and did not return.   Therapeutic Modalities:   Cognitive Behavioral Therapy Solution Focused Therapy Motivational Interviewing Relapse Prevention Therapy  Ida Rogue, LCSW 03/02/2018 2:24 PM

## 2018-03-02 NOTE — Progress Notes (Signed)
Nursing Progress Note: 7p-7a D: Pt currently presents with a anxious/depressed affect and behavior. Pt states "I don't meds. I don't care about anything. People are always accusing me of things I didn't do." Interacting appropriately with the milieu. Pt reports good sleep during the previous night with current medication regimen. Pt did attend wrap-up group.  A: Pt provided with medications per providers orders. Pt's labs and vitals were monitored throughout the night. Pt supported emotionally and encouraged to express concerns and questions. Pt educated on medications.  R: Pt's safety ensured with 15 minute and environmental checks. Pt currently denies SI, HI, and AVH. Pt verbally contracts to seek staff if SI,HI, or AVH occurs and to consult with staff before acting on any harmful thoughts. Will continue to monitor.

## 2018-03-02 NOTE — Progress Notes (Signed)
Recreation Therapy Notes  Date: 9.3.19 Time: 1000 Location: 500 Hall Dayroom  Group Topic: Communication, Team Building, Problem Solving  Goal Area(s) Addresses:  Patient will effectively work with peer towards shared goal.  Patient will identify skill used to make activity successful.  Patient will identify how skills used during activity can be used to reach post d/c goals.   Behavioral Response: Engaged  Intervention: STEM Activity   Activity: In team's, using 10 red plastic cups, patients were asked to stack the cups into a pyramid using the rubber band with the strings attached.    Education: Pharmacist, community, Building control surveyor.   Education Outcome: Acknowledges education/In group clarification offered/Needs additional education.   Clinical Observations/Feedback: Pt started off well in group.  Pt showed some leadership qualities and showed that he could follow directions.  Pt left early and did not return.    Caroll Rancher, LRT/CTRS     Lillia Abed, Tarahji Ramthun A 03/02/2018 11:25 AM

## 2018-03-02 NOTE — Plan of Care (Signed)
  Problem: Activity: Goal: Interest or engagement in activities will improve Outcome: Progressing   Problem: Safety: Goal: Periods of time without injury will increase Outcome: Progressing  DAR NOTE: Patient presents with anxious affect and mood.  Denies suicidal thoughts, pain, auditory and visual hallucinations.  Described energy level as high and concentration as good.  No behavioral issues noted this shift.  Rates depression at 6, hopelessness at 4, and anxiety at 6.  Maintained on routine safety checks.  Medications given as prescribed.  Support and encouragement offered as needed.  Attended group and participated.  States goal for today is "meet Rod."  Patient observed socializing with peers in the dayroom.  Offered no complaint.

## 2018-03-02 NOTE — Progress Notes (Signed)
[facility] MD Progress Note  03/02/2018 3:08 PM [name] [name] xxxVarela Jr.  MRN:  [id number]  Subjective: [name] reports, "I'm feeling really tired.They forced me to take the medicines that I don't want to take because it makes me real tired. My mood is terrible because no one gives a shit about me. This is making me upset everyday. [facility] to me there. They have groups & I have other people that I can talk to that cared about me in the past. I feel claustrophobic here".   [name] [name] is an 17 y/o M with history of MDD, personality disorder, ADHD, (and elsewhere schizophrenia), who was admitted from [facility] on [facility]C initiated at [facility] after pt was at his outpatient follow up appointment and verbalized suicidal ideation with plan and worsening depression. Pt was medically cleared at [facility] and then transferred to [facility] for additional treatment and stabilization. He was restarted on previous medication of thorazine, and then he was also started on trial of lexapro. Pt has been reporting incremental improvement of his presenting symptoms.  Today upon evaluation, pt is irritable and he perseverates on frustrations with being forced to take medications that he did not like to take because it they make him tired. He is also upset because he says he was moved from the 300 hall to the 500 hall because of inappropriate behavior, which he denies acting inappropriately on the unit. The attending psychiatrist had reviewed with patient that expectations in the hospital are that he is focusing on his own care, and that he will be able to receive equivalent programming and care on the 500 unit. Then, he asked [facility] [place] because he feels he was like well when he was there some time last year for 8 months. Pt denies physical complaints. He is tolerating his medications adequately. He denies SI/HI/AH/VH. Pt had explained previously that  he cannot return to staying with his father, the discussion was to  investigate other alternative options with his probation officer and SW team. Pt is in agreement to continue his current regimen without changes today.  Principal Problem: MDD (major depressive disorder), recurrent, severe, with psychosis (HCC) Diagnosis:   Patient Active Problem List   Diagnosis Date Noted  . MDD (major depressive disorder), recurrent severe, without psychosis (HCC) [F33.2] 03/11/2015    Priority: High  . Borderline personality disorder (HCC) [F60.3] 02/26/2018  . MDD (major depressive disorder), recurrent, severe, with psychosis (HCC) [F33.3] 02/25/2018  . Major depressive disorder, recurrent severe without psychotic features (HCC) [F33.2] 07/24/2017  . DKA, type 1 (HCC) [E10.10] 07/16/2017  . Nausea & vomiting [R11.2] 07/16/2017  . Uncontrolled type 1 diabetes circulatory disorder erectile dysfunction (HCC) [E10.59, N52.1, E10.65]   . DKA (diabetic ketoacidoses) (HCC) [E13.10] 12/18/2016  . History of seizures [Z87.898] 12/01/2016  . History of migraine [Z86.69] 12/01/2016  . Disordered eating [F50.9] 03/08/2015  . Ketonuria [R82.4]   . Adjustment reaction to medical therapy [F43.20]   . Non compliance w medication regimen [Z91.14]   . 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 Dehydration [E86.0] 08/22/2014  . Hyperglycemia due to type 1 diabetes mellitus (HCC) [E10.65] 08/21/2014  . Type 1 diabetes mellitus with hyperglycemia (HCC) [E10.65]   . Noncompliance [Z91.19]   . Generalized abdominal pain [R10.84]   . Glycosuria [R81]   . Depression [F32.9] 07/12/2014  . Involuntary movements [  R25.9] 06/13/2014  . Bilateral leg pain [M79.604, M79.605] 06/13/2014  . 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 Suicidal ideation [R45.851] 03/31/2014  . ODD (oppositional defiant  disorder) [F91.3] 03/03/2014  . MDD (major depressive disorder), recurrent episode, moderate (HCC) [F33.1] 02/16/2014  . 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 Microcytic anemia [D50.9] 02/15/2014  . Hyperglycemia [R73.9] 02/14/2014  . Goiter [E04.9] 02/04/2014  . 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 Acquired acanthosis nigricans [L83] 02/04/2014  . Obesity, morbid (HCC) [E66.01] 02/04/2014  . Insulin resistance [E88.81] 02/04/2014  . 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 Hypoglycemia associated with diabetes (HCC) [E11.649] 02/02/2014  . Maladaptive health behaviors affecting medical condition [F54] 02/02/2014  . Hypoglycemia [E16.2] 02/02/2014  . Type 1 diabetes mellitus in patient 66 to 19 years of age with hemoglobin A1c goal of less than 7.5% (HCC) [E10.9] 01/26/2014  . 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 Migraine without aura [G43.009] 12/13/2013  . Body mass index, pediatric, greater than or equal to 95th percentile for age [facility] 12/13/2013  . Asthma [J45.909] 12/13/2013  . Hypoglycemia unawareness in type 1 diabetes mellitus (HCC) [E10.649] 08/08/2013  . Seizure disorder (HCC) [G40.909] 07/06/2013  . Short-term memory loss [R41.3] 07/06/2013  . Sickle cell trait (HCC) [D57.3] 07/06/2013  . Diabetes (HCC) [E11.9] 06/17/2013  . Diabetes mellitus, new onset (HCC) [E11.9] 06/17/2013   Total Time spent with patient: 15 minutes  Past Psychiatric History: See H&P  Past Medical History:  Past Medical History:  Diagnosis Date  . ADD (attention deficit disorder)   . Allergy   . Anxiety   . Asthma   . Depression   . Epileptic seizure (HCC)    "both petit and grand mal; last sz ~ 2 wk ago" (06/17/2013)  . Headache(784.0)    "2-3 times/wk usually" (06/17/2013)  .  Heart murmur    "heard a slight one earlier today" (06/17/2013)  . Migraine    "maybe once/month; it's severe" (06/17/2013)  . ODD (oppositional defiant disorder)   . Sickle cell trait (HCC)   . Type I diabetes mellitus (HCC)    "that's what they're thinking now" (06/17/2013)  . Vision abnormalities    "takes him longer to focus cause; from his sz" (06/17/2013)    Past Surgical History:  Procedure Laterality Date  . CIRCUMCISION  2000  . FINGER SURGERY Left 2001   "crushed pinky; had to repair it" (06/17/2013)   Family History:  Family History  Problem Relation Age of Onset  . Asthma Mother   . COPD Mother   . Other Mother        possible autoimmune, unclear  . Diabetes Maternal Grandmother   . Heart disease Maternal Grandmother   . Hypertension Maternal Grandmother   . Mental illness Maternal Grandmother   . Heart disease Maternal Grandfather   . Hyperlipidemia Paternal Grandmother   . Hyperlipidemia Paternal Grandfather   . Migraines Sister        Hemiplegic Migraines    Family Psychiatric  History: See H&P  Social History:  Social History   Substance and Sexual Activity  Alcohol Use No  . Alcohol/week: 0.0 standard drinks     Social History   Substance and Sexual Activity  Drug Use No    Social History   Socioeconomic History  . Marital status:  Single    Spouse name: Not on file  . Number of children: Not on file  . Years of education: Not on file  . Highest education level: Not on file  Occupational History  . Not on file  Social Needs  . Financial resource strain: Not on file  . Food insecurity:    Worry: Not on file    Inability: Not on file  . Transportation needs:    Medical: Not on file    Non-medical: Not on file  Tobacco Use  . Smoking status: Passive Smoke Exposure - Never Smoker  . Smokeless tobacco: Never Used  . Tobacco comment: Mom and dad smoke outside   Substance and Sexual Activity  . Alcohol use: No    Alcohol/week: 0.0  standard drinks  . Drug use: No  . Sexual activity: Never  Lifestyle  . Physical activity:    Days per week: Not on file    Minutes per session: Not on file  . Stress: Not on file  Relationships  . Social connections:    Talks on phone: Not on file    Gets together: Not on file    Attends religious service: Not on file    Active member of club or organization: Not on file    Attends meetings of clubs or organizations: Not on file    Relationship status: Not on file  Other Topics Concern  . Not on file  Social History Narrative   Lives at home with father, stepmother, 30 year old brother.   Additional Social History:   Sleep: Good  Appetite:  Good  Current Medications: Current Facility-Administered Medications  Medication Dose Route Frequency Provider Last Rate Last Dose  . acetaminophen (TYLENOL) tablet 650 mg  650 mg Oral Q6H PRN Money, Gerlene Burdock, FNP   650 mg at 02/26/18 2009  . benztropine (COGENTIN) tablet 1 mg  1 mg Oral BID PRN Micheal Likens, MD       Or  . benztropine mesylate (COGENTIN) injection 1 mg  1 mg Intramuscular BID PRN Micheal Likens, MD   1 mg at 03/02/18 0741  . chlorproMAZINE (THORAZINE) tablet 50 mg  50 mg Oral Q6H PRN Micheal Likens, MD   50 mg at 03/01/18 1403   Or  . chlorproMAZINE (THORAZINE) injection 50 mg  50 mg Intramuscular Q6H PRN Micheal Likens, MD   50 mg at 03/02/18 0741  . chlorproMAZINE (THORAZINE) tablet 50 mg  50 mg Oral BID Micheal Likens, MD   50 mg at 02/26/18 1703  . escitalopram (LEXAPRO) tablet 5 mg  5 mg Oral Daily Oneta Rack, NP   5 mg at 02/28/18 0754  . hydrOXYzine (ATARAX/VISTARIL) tablet 50 mg  50 mg Oral Q6H PRN Micheal Likens, MD   50 mg at 03/01/18 1403  . insulin aspart (novoLOG) injection 0-15 Units  0-15 Units Subcutaneous TID WC Rankin, [name], NP   5 Units at 03/02/18 1216  . insulin aspart (novoLOG) injection 8 Units  8 Units Subcutaneous TID WC  Rankin, [name], NP   8 Units at 03/02/18 1217  . insulin glargine (LANTUS) injection 35 Units  35 Units Subcutaneous BID Antonieta Pert, MD   35 Units at 03/02/18 0746  . LORazepam (ATIVAN) tablet 1 mg  1 mg Oral Q6H PRN Micheal Likens, MD   1 mg at 03/01/18 1403   Or  . LORazepam (ATIVAN) injection 1 mg  1 mg Intramuscular  Q6H PRN Micheal Likens, MD      . nicotine (NICODERM CQ - dosed in mg/24 hours) patch 21 mg  21 mg Transdermal Daily Micheal Likens, MD   21 mg at 02/26/18 1454  . traZODone (DESYREL) tablet 100 mg  100 mg Oral QHS,MR X 1 [name] A, NP   100 mg at 02/28/18 2307    Lab Results:  Results for orders placed or performed during the hospital encounter of 02/25/18 (from the past 48 hour(s))  Glucose, capillary     Status: Abnormal   Collection Time: 02/28/18  4:01 PM  Result Value Ref Range   Glucose-Capillary 322 (H) 70 - 99 mg/dL  Glucose, capillary     Status: Abnormal   Collection Time: 02/28/18  5:07 PM  Result Value Ref Range   Glucose-Capillary 213 (H) 70 - 99 mg/dL   Comment 1 Notify RN    Comment 2 Document in Chart   Glucose, capillary     Status: Abnormal   Collection Time: 02/28/18  8:58 PM  Result Value Ref Range   Glucose-Capillary 191 (H) 70 - 99 mg/dL  Glucose, capillary     Status: Abnormal   Collection Time: 03/01/18  6:09 AM  Result Value Ref Range   Glucose-Capillary 305 (H) 70 - 99 mg/dL  Glucose, capillary     Status: Abnormal   Collection Time: 03/01/18 12:00 PM  Result Value Ref Range   Glucose-Capillary 284 (H) 70 - 99 mg/dL  Glucose, capillary     Status: Abnormal   Collection Time: 03/01/18  6:32 PM  Result Value Ref Range   Glucose-Capillary 185 (H) 70 - 99 mg/dL  Glucose, capillary     Status: Abnormal   Collection Time: 03/01/18  8:30 PM  Result Value Ref Range   Glucose-Capillary 277 (H) 70 - 99 mg/dL  Glucose, capillary     Status: Abnormal   Collection Time: 03/02/18  6:10 AM  Result  Value Ref Range   Glucose-Capillary 346 (H) 70 - 99 mg/dL  Glucose, capillary     Status: Abnormal   Collection Time: 03/02/18 12:03 PM  Result Value Ref Range   Glucose-Capillary 223 (H) 70 - 99 mg/dL   Comment 1 Notify RN    Comment 2 Document in Chart    Blood Alcohol level:  Lab Results  Component Value Date   ETH <10 02/23/2018   ETH <10 08/14/2017   Metabolic Disorder Labs: Lab Results  Component Value Date   HGBA1C 10.1 (H) 01/26/2017   MPG 243 01/26/2017   MPG 263 12/18/2016   No results found for: PROLACTIN Lab Results  Component Value Date   TRIG 244 (H) 12/18/2016   Physical Findings: AIMS: Facial and Oral Movements Muscles of Facial Expression: None, normal Lips and Perioral Area: None, normal Jaw: None, normal Tongue: None, normal,Extremity Movements Upper (arms, wrists, hands, fingers): None, normal Lower (legs, knees, ankles, toes): None, normal, Trunk Movements Neck, shoulders, hips: None, normal, Overall Severity Severity of abnormal movements (highest score from questions above): None, normal Incapacitation due to abnormal movements: None, normal Patient's awareness of abnormal movements (rate only patient's report): No Awareness, Dental Status Current problems with teeth and/or dentures?: No Does patient usually wear dentures?: No  CIWA:    COWS:     Musculoskeletal: Strength & Muscle Tone: within normal limits Gait & Station: normal Patient leans: N/A  Psychiatric Specialty Exam: Physical Exam  Nursing note and vitals reviewed.   Review of Systems  Constitutional: Negative for chills and fever.  Respiratory: Negative for cough and shortness of breath.   Cardiovascular: Negative for chest pain.  Gastrointestinal: Negative for abdominal pain, heartburn, nausea and vomiting.  Psychiatric/Behavioral: Positive for depression. Negative for hallucinations and suicidal ideas. The patient is nervous/anxious. The patient does not have insomnia.      Blood pressure 129/82, pulse 89, temperature 99 F (37.2 C), temperature source Oral, resp. rate 18, height 6\' 3"  (1.905 m), weight 89.8 kg, SpO2 100 %.Body mass index is 24.75 kg/m.  General Appearance: Casual and Fairly Groomed  Eye Contact:  Good  Speech:  Clear and Coherent and Normal Rate  Volume:  Normal  Mood:  Irritable  Affect:  Appropriate, Congruent and Constricted  Thought Process:  Coherent and Goal Directed  Orientation:  Full (Time, Place, and Person)  Thought Content:  Logical  Suicidal Thoughts:  No  Homicidal Thoughts:  No  Memory:  Immediate;   Fair Recent;   Fair Remote;   Fair  Judgement:  Fair  Insight:  Fair  Psychomotor Activity:  Normal  Concentration:  Concentration: Fair  Recall:  [employer] of Knowledge:  Fair  Language:  Fair  Akathisia:  No  Handed:    AIMS (if indicated):     Assets:  Resilience Social Support  ADL's:  Intact  Cognition:  WNL  Sleep:  Number of Hours: 6.75   Treatment Plan Summary: Daily contact with patient to assess and evaluate symptoms and progress in treatment and Medication management   Continue inpatient hospitalization.  Will continue today 03/02/2018 plan as below except where it is noted.  Mood control/depression: -Continue Thorazine 50 mg BID   -Continue Lexapro 5  mg po Q day  Insomnia             Continue with Trazodone 100 mg  Dm:  Continue sliding scale novolog Continue Lantus 35 units BID Encouraged carb counting as was suggested by patient   Will continue to monitor vitals, medication compliance and treatment side effects while patient is here.   CSW will continue working on dispositionand patient to consider group home placement.   Patient to participate in therapeutic milieu  Armandina Stammer, NP, PMHNP, FNP-BC. 03/02/2018, 3:08 PMPatient ID: [name] [name] xxxVarela [name]., male   DOB: 03/21/2001, 17 y.o.   MRN: [id number]

## 2018-03-03 LAB — GLUCOSE, CAPILLARY
GLUCOSE-CAPILLARY: 245 mg/dL — AB (ref 70–99)
GLUCOSE-CAPILLARY: 357 mg/dL — AB (ref 70–99)
GLUCOSE-CAPILLARY: 193 mg/dL — AB (ref 70–99)
Glucose-Capillary: 215 mg/dL — ABNORMAL HIGH (ref 70–99)

## 2018-03-03 NOTE — [facility] Group Notes (Signed)
LCSW Group Therapy Note  03/03/2018 1:15pm    Type of Therapy and Topic:  Group Therapy:  Who Am I?  Self Esteem, Self-Actualization and Understanding Self    Participation Level:  Did Not Attend  Description of Group:    In this group patients will be asked to explore values, beliefs, truths, and morals as they relate to personal self.  Patients will be guided to discuss their thoughts, feelings, and behaviors related to what they identify as important to their true self. Patients will process together how values, beliefs and truths are connected to specific choices patients make every day. Each patient will be challenged to identify changes that they are motivated to make in order to improve self-esteem and self-actualization. This group will be process-oriented, with patients participating in exploration of their own experiences, giving and receiving support, and processing challenge from other group members.   Therapeutic Goals: 1. Patient will identify false beliefs that currently interfere with their self-esteem.  2. Patient will identify feelings, thought process, and behaviors related to self and will become aware of the uniqueness of themselves and of others.  3. Patient will be able to identify and verbalize values, morals, and beliefs as they relate to self. 4. Patient will begin to learn how to build self-esteem/self-awareness by expressing what is important and unique to them personally.   Summary of Patient Progress      Therapeutic Modalities:   Cognitive Behavioral Therapy Solution Focused Therapy Motivational Interviewing Brief Therapy   Ida Rogue, LCSW 03/03/2018 11:24 AM

## 2018-03-03 NOTE — Plan of Care (Signed)
Progress note  D: pt found in bed; refused to get up for medications. Pt states he slept poorly. Pt rates his depression/hopelessness/anxiety a 4/1/4 out of 10 respectively. Pt denies any physical pain at this time rating this a 0/10. Pt declined to put a goal or how he would meet it on his self inventory. Pt denies any si/hi/ah/vh and verbally agrees to approach staff if these become apparent.  A: pt provided support and encouragement. Pt given medications per protocol and standing orders. Q69m safety checks implemented and continued.  R: pt safe on the unit. Will continue to monitor.   Pt progressing in the following metrics  Problem: Education: Goal: Knowledge of [facility] Education information/materials will improve Outcome: Progressing Goal: Emotional status will improve Outcome: Progressing Goal: Mental status will improve Outcome: Progressing Goal: Verbalization of understanding the information provided will improve Outcome: Progressing   Problem: Coping: Goal: Ability to verbalize frustrations and anger appropriately will improve Outcome: Progressing Goal: Ability to demonstrate self-control will improve Outcome: Progressing

## 2018-03-03 NOTE — Progress Notes (Signed)
Nursing Progress Note: 7p-7a D: Pt currently presents with a animated/childlike affect and behavior. Interacting appropriately with the milieu. Pt reports good sleep during the previous night with current medication regimen. Pt did attend wrap-up group.  A: Pt provided with medications per providers orders. Pt's labs and vitals were monitored throughout the night. Pt supported emotionally and encouraged to express concerns and questions. Pt educated on medications.  R: Pt's safety ensured with 15 minute and environmental checks. Pt currently denies SI, HI, and AVH. Pt verbally contracts to seek staff if SI,HI, or AVH occurs and to consult with staff before acting on any harmful thoughts. Will continue to monitor.

## 2018-03-03 NOTE — Progress Notes (Signed)
[facility] MD Progress Note  03/03/2018 11:10 AM [name] [name].  MRN:  [id number]  Subjective: [name] [name] reports, "Why is it that you guys keep checking on me & asking me how I'm doing? I know you guys do not care about me. This place is not helping me. I need to be transferred to the [facility]. I will feel & do better there. Why are you telling me that I cannot leave this county to another county? All the doctor needs to do is, write a letter telling the judge that I needed to be transferred to the [facility] to continue treatment. No judge will rule against Dr. orders".  [name] [name] is an 17 y/o M with history of MDD, personality disorder, ADHD, (and elsewhere schizophrenia), who was admitted from [facility] on IVC initiated at [facility] after pt was at his outpatient follow up appointment and verbalized suicidal ideation with plan and worsening depression. Pt was medically cleared at [facility] and then transferred to [facility] for additional treatment and stabilization. He was restarted on previous medication of thorazine, and then he was also started on trial of lexapro. Pt has been reporting incremental improvement of his presenting symptoms.  Today upon evaluation, pt is irritable, not making any eye contacts. He says he does not want to be here. He says the medicines we are giving him are not helping because he remains depressed.  He stated yesterday that he was upset because he was moved from the 300 hall to the 500 hall because of inappropriate behavior, which he denies acting inappropriately on the unit. The attending psychiatrist had reviewed with patient that expectations in the hospital are that he is focusing on his own care, and that he will be able to receive equivalent programming and care on the 500 unit. He continues to asked to [facility] [place] because he feels he was like well when he was there some time last year for 8 months. He says the doctor should write a  letter expressing the need for him to go to the [facility] to continue treatment & no judge will deny the doctors orders. Pt denies physical complaints. He is tolerating his medications adequately. He denies SI/HI/AH/VH. Pt had explained previously that he cannot return to staying with his father, the discussion was to  investigate other alternative options with his probation officer and SW team. Pt is in agreement to continue his current regimen without changes today.  Principal Problem: MDD (major depressive disorder), recurrent, severe, with psychosis (HCC) Diagnosis:   Patient Active Problem List   Diagnosis Date Noted  . MDD (major depressive disorder), recurrent severe, without psychosis (HCC) [F33.2] 03/11/2015    Priority: High  . Borderline personality disorder (HCC) [F60.3] 02/26/2018  . MDD (major depressive disorder), recurrent, severe, with psychosis (HCC) [F33.3] 02/25/2018  . Major depressive disorder, recurrent severe without psychotic features (HCC) [F33.2] 07/24/2017  . DKA, type 1 (HCC) [E10.10] 07/16/2017  . Nausea & vomiting [R11.2] 07/16/2017  . Uncontrolled type 1 diabetes circulatory disorder erectile dysfunction (HCC) [E10.59, N52.1, E10.65]   . DKA (diabetic ketoacidoses) (HCC) [E13.10] 12/18/2016  . History of seizures [Z87.898] 12/01/2016  . History of migraine [Z86.69] 12/01/2016  . Disordered eating [F50.9] 03/08/2015  . Ketonuria [R82.4]   . Adjustment reaction to medical therapy [F43.20]   . Non compliance w medication regimen [Z91.14]   . 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 Dehydration [  E86.0] 08/22/2014  . Hyperglycemia due to type 1 diabetes mellitus (HCC) [E10.65] 08/21/2014  . Type 1 diabetes mellitus with hyperglycemia (HCC) [E10.65]   . Noncompliance [Z91.19]   . Generalized abdominal pain [R10.84]   . Glycosuria [R81]   . Depression [F32.9] 07/12/2014  . Involuntary movements  [R25.9] 06/13/2014  . Bilateral leg pain [M79.604, M79.605] 06/13/2014  . 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 Suicidal ideation [R45.851] 03/31/2014  . ODD (oppositional defiant disorder) [F91.3] 03/03/2014  . MDD (major depressive disorder), recurrent episode, moderate (HCC) [F33.1] 02/16/2014  . 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 Microcytic anemia [D50.9] 02/15/2014  . Hyperglycemia [R73.9] 02/14/2014  . Goiter [E04.9] 02/04/2014  . 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 Acquired acanthosis nigricans [L83] 02/04/2014  . Obesity, morbid (HCC) [E66.01] 02/04/2014  . Insulin resistance [E88.81] 02/04/2014  . 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 Hypoglycemia associated with diabetes (HCC) [E11.649] 02/02/2014  . Maladaptive health behaviors affecting medical condition [F54] 02/02/2014  . Hypoglycemia [E16.2] 02/02/2014  . Type 1 diabetes mellitus in patient 32 to 19 years of age with hemoglobin A1c goal of less than 7.5% (HCC) [E10.9] 01/26/2014  . 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 Migraine without aura [G43.009] 12/13/2013  . Body mass index, pediatric, greater than or equal to 95th percentile for age [facility] 12/13/2013  . Asthma [J45.909] 12/13/2013  . Hypoglycemia unawareness in type 1 diabetes mellitus (HCC) [E10.649] 08/08/2013  . Seizure disorder (HCC) [G40.909] 07/06/2013  . Short-term memory loss [R41.3] 07/06/2013  . Sickle cell trait (HCC) [D57.3] 07/06/2013  . Diabetes (HCC) [E11.9] 06/17/2013  . Diabetes mellitus, new onset (HCC) [E11.9] 06/17/2013   Total Time spent with patient: 15 minutes  Past Psychiatric History: See H&P  Past Medical History:  Past Medical History:   Diagnosis Date  . ADD (attention deficit disorder)   . Allergy   . Anxiety   . Asthma   . Depression   . Epileptic seizure (HCC)    "both petit and grand mal; last sz ~ 2 wk ago" (06/17/2013)  . Headache(784.0)    "2-3 times/wk usually" (06/17/2013)  . Heart murmur    "heard a slight one earlier today" (06/17/2013)  . Migraine    "maybe once/month; it's severe" (06/17/2013)  . ODD (oppositional defiant disorder)   . Sickle cell trait (HCC)   . Type I diabetes mellitus (HCC)    "that's what they're thinking now" (06/17/2013)  . Vision abnormalities    "takes him longer to focus cause; from his sz" (06/17/2013)    Past Surgical History:  Procedure Laterality Date  . CIRCUMCISION  2000  . FINGER SURGERY Left 2001   "crushed pinky; had to repair it" (06/17/2013)   Family History:  Family History  Problem Relation Age of Onset  . Asthma Mother   . COPD Mother   . Other Mother        possible autoimmune, unclear  . Diabetes Maternal Grandmother   . Heart disease Maternal Grandmother   . Hypertension Maternal Grandmother   . Mental illness Maternal Grandmother   . Heart disease Maternal Grandfather   . Hyperlipidemia Paternal Grandmother   . Hyperlipidemia Paternal Grandfather   . Migraines Sister        Hemiplegic Migraines    Family Psychiatric  History: See H&P  Social History:  Social History   Substance and Sexual Activity  Alcohol Use No  . Alcohol/week: 0.0 standard drinks     Social History   Substance and Sexual Activity  Drug Use No    Social History   Socioeconomic History  . Marital status: Single    Spouse name: Not on file  . Number of children: Not on file  . Years of education: Not on file  . Highest education level: Not on file  Occupational History  . Not on file  Social Needs  . Financial resource strain: Not on file  . Food insecurity:    Worry: Not on file    Inability: Not on file  . Transportation needs:    Medical: Not on  file    Non-medical: Not on file  Tobacco Use  . Smoking status: Passive Smoke Exposure - Never Smoker  . Smokeless tobacco: Never Used  . Tobacco comment: Mom and dad smoke outside   Substance and Sexual Activity  . Alcohol use: No    Alcohol/week: 0.0 standard drinks  . Drug use: No  . Sexual activity: Never  Lifestyle  . Physical activity:    Days per week: Not on file    Minutes per session: Not on file  . Stress: Not on file  Relationships  . Social connections:    Talks on phone: Not on file    Gets together: Not on file    Attends religious service: Not on file    Active member of club or organization: Not on file    Attends meetings of clubs or organizations: Not on file    Relationship status: Not on file  Other Topics Concern  . Not on file  Social History Narrative   Lives at home with father, stepmother, 67 year old brother.   Additional Social History:   Sleep: Good  Appetite:  Good  Current Medications: Current Facility-Administered Medications  Medication Dose Route Frequency Provider Last Rate Last Dose  . acetaminophen (TYLENOL) tablet 650 mg  650 mg Oral Q6H PRN Money, Gerlene Burdock, FNP   650 mg at 02/26/18 2009  . benztropine (COGENTIN) tablet 1 mg  1 mg Oral BID PRN Micheal Likens, MD       Or  . benztropine mesylate (COGENTIN) injection 1 mg  1 mg Intramuscular BID PRN Micheal Likens, MD   1 mg at 03/02/18 0741  . chlorproMAZINE (THORAZINE) tablet 50 mg  50 mg Oral Q6H PRN Micheal Likens, MD   50 mg at 03/01/18 1403   Or  . chlorproMAZINE (THORAZINE) injection 50 mg  50 mg Intramuscular Q6H PRN Micheal Likens, MD   50 mg at 03/02/18 0741  . chlorproMAZINE (THORAZINE) tablet 50 mg  50 mg Oral BID Micheal Likens, MD   50 mg at 03/03/18 0811  . escitalopram (LEXAPRO) tablet 5 mg  5 mg Oral Daily Oneta Rack, NP   5 mg at 03/03/18 0810  . hydrOXYzine (ATARAX/VISTARIL) tablet 50 mg  50 mg Oral Q6H PRN  Micheal Likens, MD   50 mg at 03/01/18 1403  . insulin aspart (novoLOG) injection 0-15 Units  0-15 Units Subcutaneous TID WC Rankin, [name], NP   5 Units at 03/03/18 0626  . insulin aspart (novoLOG) injection 8 Units  8 Units Subcutaneous TID WC Rankin, [name], NP   8 Units at 03/03/18 0627  . insulin glargine (LANTUS) injection 35 Units  35 Units Subcutaneous  BID Antonieta Pert, MD   35 Units at 03/03/18 0809  . LORazepam (ATIVAN) tablet 1 mg  1 mg Oral Q6H PRN Micheal Likens, MD   1 mg at 03/01/18 1403   Or  . LORazepam (ATIVAN) injection 1 mg  1 mg Intramuscular Q6H PRN Micheal Likens, MD      . nicotine (NICODERM CQ - dosed in mg/24 hours) patch 21 mg  21 mg Transdermal Daily Micheal Likens, MD   21 mg at 03/03/18 0810  . traZODone (DESYREL) tablet 100 mg  100 mg Oral QHS,MR X 1 [name] A, NP   100 mg at 03/02/18 2337   Lab Results:  Results for orders placed or performed during the hospital encounter of 02/25/18 (from the past 48 hour(s))  Glucose, capillary     Status: Abnormal   Collection Time: 03/01/18 12:00 PM  Result Value Ref Range   Glucose-Capillary 284 (H) 70 - 99 mg/dL  Glucose, capillary     Status: Abnormal   Collection Time: 03/01/18  6:32 PM  Result Value Ref Range   Glucose-Capillary 185 (H) 70 - 99 mg/dL  Glucose, capillary     Status: Abnormal   Collection Time: 03/01/18  8:30 PM  Result Value Ref Range   Glucose-Capillary 277 (H) 70 - 99 mg/dL  Glucose, capillary     Status: Abnormal   Collection Time: 03/02/18  6:10 AM  Result Value Ref Range   Glucose-Capillary 346 (H) 70 - 99 mg/dL  Glucose, capillary     Status: Abnormal   Collection Time: 03/02/18 12:03 PM  Result Value Ref Range   Glucose-Capillary 223 (H) 70 - 99 mg/dL   Comment 1 Notify RN    Comment 2 Document in Chart   Glucose, capillary     Status: Abnormal   Collection Time: 03/02/18  5:04 PM  Result Value Ref Range   Glucose-Capillary 387  (H) 70 - 99 mg/dL   Comment 1 Notify RN    Comment 2 Document in Chart   Glucose, capillary     Status: Abnormal   Collection Time: 03/02/18  9:09 PM  Result Value Ref Range   Glucose-Capillary 216 (H) 70 - 99 mg/dL  Glucose, capillary     Status: Abnormal   Collection Time: 03/03/18  6:13 AM  Result Value Ref Range   Glucose-Capillary 215 (H) 70 - 99 mg/dL   Blood Alcohol level:  Lab Results  Component Value Date   ETH <10 02/23/2018   ETH <10 08/14/2017   Metabolic Disorder Labs: Lab Results  Component Value Date   HGBA1C 10.1 (H) 01/26/2017   MPG 243 01/26/2017   MPG 263 12/18/2016   No results found for: PROLACTIN Lab Results  Component Value Date   TRIG 244 (H) 12/18/2016   Physical Findings: AIMS: Facial and Oral Movements Muscles of Facial Expression: None, normal Lips and Perioral Area: None, normal Jaw: None, normal Tongue: None, normal,Extremity Movements Upper (arms, wrists, hands, fingers): None, normal Lower (legs, knees, ankles, toes): None, normal, Trunk Movements Neck, shoulders, hips: None, normal, Overall Severity Severity of abnormal movements (highest score from questions above): None, normal Incapacitation due to abnormal movements: None, normal Patient's awareness of abnormal movements (rate only patient's report): No Awareness, Dental Status Current problems with teeth and/or dentures?: No Does patient usually wear dentures?: No  CIWA:    COWS:     Musculoskeletal: Strength & Muscle Tone: within normal limits Gait & Station: normal Patient leans:  N/A  Psychiatric Specialty Exam: Physical Exam  Nursing note and vitals reviewed.   Review of Systems  Constitutional: Negative for chills and fever.  Respiratory: Negative for cough and shortness of breath.   Cardiovascular: Negative for chest pain.  Gastrointestinal: Negative for abdominal pain, heartburn, nausea and vomiting.  Psychiatric/Behavioral: Positive for depression. Negative for  hallucinations and suicidal ideas. The patient is nervous/anxious. The patient does not have insomnia.     Blood pressure 129/82, pulse 89, temperature 99 F (37.2 C), temperature source Oral, resp. rate 18, height 6\' 3"  (1.905 m), weight 89.8 kg, SpO2 100 %.Body mass index is 24.75 kg/m.  General Appearance: Casual and Fairly Groomed  Eye Contact:  Good  Speech:  Clear and Coherent and Normal Rate  Volume:  Normal  Mood:  Irritable  Affect:  Appropriate, Congruent and Constricted  Thought Process:  Coherent and Goal Directed  Orientation:  Full (Time, Place, and Person)  Thought Content:  Logical  Suicidal Thoughts:  No  Homicidal Thoughts:  No  Memory:  Immediate;   Fair Recent;   Fair Remote;   Fair  Judgement:  Fair  Insight:  Fair  Psychomotor Activity:  Normal  Concentration:  Concentration: Fair  Recall:  [employer] of Knowledge:  Fair  Language:  Fair  Akathisia:  No  Handed:    AIMS (if indicated):     Assets:  Resilience Social Support  ADL's:  Intact  Cognition:  WNL  Sleep:  Number of Hours: 5.25   Treatment Plan Summary: Daily contact with patient to assess and evaluate symptoms and progress in treatment and Medication management   Continue inpatient hospitalization.  Will continue today 03/03/2018 plan as below except where it is noted.  Mood control/depression: -Continue Thorazine 50 mg BID   -Continue Lexapro 5  mg po Q day  Insomnia             Continue with Trazodone 100 mg  Dm:  Continue sliding scale novolog Continue Lantus 35 units BID Encouraged carb counting as was suggested by patient   Will continue to monitor vitals, medication compliance and treatment side effects while patient is here.   CSW will continue working on dispositionand patient to consider group home placement.   Patient to participate in therapeutic milieu  Armandina Stammer, NP, PMHNP, FNP-BC. 03/03/2018,  11:10 AMPatient ID: [name] [name]., male   DOB: August 21, 2000, 17 y.o.   MRN: [id number]

## 2018-03-03 NOTE — Progress Notes (Signed)
Recreation Therapy Notes  Date: 9.4.19 Time: 1000 Location: 500 Hall Dayroom  Group Topic: Coping Skills  Goal Area(s) Addresses:  Patient will be able to identify positive coping skills. Patient will be able to identify benefits of using coping skills post d/c.  Intervention: [employer], dry erase marker, worksheet  Activity: Coping Skills Mindmap.  LRT introduced coping skills to group.  LRT and patients filled in the first 8 boxes of their mind map with anger, finances, sadness, stress, poor communication, anxiety, suicidal thoughts and depression.  Patients were to then come up with at least 3 coping skills for each situation.  Education: Pharmacologist, Building control surveyor.   Education Outcome: Acknowledges understanding/In group clarification offered/Needs additional education.   Clinical Observations/Feedback: Patient did not attend group.    Caroll Rancher, LRT/CTRS         Caroll Rancher A 03/03/2018 12:10 PM

## 2018-03-03 NOTE — Tx Team (Signed)
Interdisciplinary Treatment and Diagnostic Plan Update  03/03/2018 Time of Session: 9:19 AM  [name] xxxVarela [name]. MRN: [id number]  Principal Diagnosis: MDD (major depressive disorder), recurrent, severe, with psychosis ([place])  Secondary Diagnoses: Principal Problem:   MDD (major depressive disorder), recurrent, severe, with psychosis ([place]) Active Problems:   Borderline personality disorder ([place])   Current Medications:  Current Facility-Administered Medications  Medication Dose Route Frequency Provider Last Rate Last Dose  . acetaminophen (TYLENOL) tablet 650 mg  650 mg Oral Q6H PRN Money, [name], FNP   650 mg at 02/26/18 2009  . benztropine (COGENTIN) tablet 1 mg  1 mg Oral BID PRN [name], MD       Or  . benztropine mesylate (COGENTIN) injection 1 mg  1 mg Intramuscular BID PRN [name], MD   1 mg at 03/02/18 0741  . chlorproMAZINE (THORAZINE) tablet 50 mg  50 mg Oral Q6H PRN [name], MD   50 mg at 03/01/18 1403   Or  . chlorproMAZINE (THORAZINE) injection 50 mg  50 mg Intramuscular Q6H PRN [name], MD   50 mg at 03/02/18 0741  . chlorproMAZINE (THORAZINE) tablet 50 mg  50 mg Oral BID [name], MD   50 mg at 03/03/18 0811  . escitalopram (LEXAPRO) tablet 5 mg  5 mg Oral Daily Derrill Center, NP   5 mg at 03/03/18 0810  . hydrOXYzine (ATARAX/VISTARIL) tablet 50 mg  50 mg Oral Q6H PRN [name], MD   50 mg at 03/01/18 1403  . insulin aspart (novoLOG) injection 0-15 Units  0-15 Units Subcutaneous TID WC Rankin, [name], NP   5 Units at 03/03/18 0626  . insulin aspart (novoLOG) injection 8 Units  8 Units Subcutaneous TID WC Rankin, [name], NP   8 Units at 03/03/18 0627  . insulin glargine (LANTUS) injection 35 Units  35 Units Subcutaneous BID [name], MD   35 Units at 03/03/18 0809  . LORazepam (ATIVAN) tablet 1 mg  1 mg Oral Q6H PRN [name], MD   1 mg at  03/01/18 1403   Or  . LORazepam (ATIVAN) injection 1 mg  1 mg Intramuscular Q6H PRN [name], MD      . nicotine (NICODERM CQ - dosed in mg/24 hours) patch 21 mg  21 mg Transdermal Daily [name], MD   21 mg at 03/03/18 0810  . traZODone (DESYREL) tablet 100 mg  100 mg Oral QHS,MR X 1 Lindon Romp A, NP   100 mg at 03/02/18 2337    PTA Medications: Medications Prior to Admission  Medication Sig Dispense Refill Last Dose  . albuterol (PROVENTIL HFA;VENTOLIN HFA) 108 (90 Base) MCG/ACT inhaler Inhale 2 puffs into the lungs every 6 (six) hours as needed for wheezing or shortness of breath.   02/23/2018 at Unknown time  . cetirizine (ZYRTEC) 10 MG tablet Take 10 mg by mouth at bedtime as needed for allergies.   02/23/2018 at Unknown time  . chlorproMAZINE (THORAZINE) 50 MG tablet Take 1 tablet (50 mg total) by mouth 2 (two) times daily. For mood control (Patient not taking: Reported on 08/15/2017) 60 tablet 0 Not Taking at Unknown time  . EPINEPHrine 0.3 mg/0.3 mL IJ SOAJ injection Inject 0.3 mg into the muscle as needed (PRN for anaphylaxis).   unknown  . escitalopram (LEXAPRO) 20 MG tablet Take 1 tablet (20 mg total) by mouth daily. For depression (Patient not taking:  Reported on 08/15/2017) 30 tablet 0 Not Taking at Unknown time  . gabapentin (NEURONTIN) 300 MG capsule Take 1 capsule (300 mg total) by mouth 3 (three) times daily. For agitation (Patient not taking: Reported on 08/15/2017) 90 capsule 0 Not Taking at Unknown time  . glucagon (GLUCAGON EMERGENCY) 1 MG injection Inject 1 mg into the muscle once as needed (severe hypoglycemia - if unresponsive, unable to swallow, unconscious and/or has seizure). (Patient not taking: Reported on 08/15/2017) 1 each 12 Not Taking at Unknown time  . glucose blood (ACCU-CHEK GUIDE) test strip Test 8 times daily: For blood sugar checks or monitoring 200 each 5 Not Taking at Unknown time  . glucose blood (ACCU-CHEK GUIDE) test strip  Check glucose 6x daily: For blood sugar checks 200 each 0 Not Taking at Unknown time  . hydrOXYzine (ATARAX/VISTARIL) 25 MG tablet Take 1 tablet (25 mg total) by mouth every 6 (six) hours as needed for anxiety. (Patient not taking: Reported on 08/15/2017) 60 tablet 0 Not Taking at Unknown time  . insulin aspart (NOVOLOG FLEXPEN) 100 UNIT/ML FlexPen Inject per protocol up to 50 units per day: For diabetes management (Patient taking differently: Inject 0-15 Units into the skin 3 (three) times daily with meals. Sliding scale) 15 mL 5 02/23/2018 at Unknown time  . insulin glargine (LANTUS) 100 UNIT/ML injection Inject 0.3 mLs (30 Units total) into the skin 2 (two) times daily. For diabetes management (Patient taking differently: Inject 35 Units into the skin 2 (two) times daily. For diabetes management) 10 mL 0 02/23/2018 at Unknown time  . nicotine (NICODERM CQ - DOSED IN MG/24 HOURS) 21 mg/24hr patch Place 1 patch (21 mg total) onto the skin daily. For smoking cessation (Patient not taking: Reported on 08/15/2017) 28 patch 0 Not Taking at Unknown time  . SUMAtriptan (IMITREX) 25 MG tablet Take 25 mg by mouth daily as needed for migraine. May repeat in 2 hours if headache persists or recurs.   Past Month at Unknown time  . traZODone (DESYREL) 100 MG tablet Take 1 tablet (100 mg) by mouth at bedtime: For sleep (Patient not taking: Reported on 08/15/2017) 30 tablet 0 Not Taking at Unknown time    Patient Stressors: Financial difficulties Legal issue Occupational concerns  Patient Strengths: Ability for insight Capable of independent living Communication skills Physical Health Supportive family/friends  Treatment Modalities: Medication Management, Group therapy, Case management,  1 to 1 session with clinician, Psychoeducation, Recreational therapy.   Physician Treatment Plan for Primary Diagnosis: MDD (major depressive disorder), recurrent, severe, with psychosis ([place])   Long Term Goal(s): Improvement  in symptoms so as ready for discharge  Short Term Goals: Ability to demonstrate self-control will improve Ability to identify and develop effective coping behaviors will improve Ability to maintain clinical measurements within normal limits will improve  Medication Management: Evaluate patient's response, side effects, and tolerance of medication regimen.  Therapeutic Interventions: 1 to 1 sessions, Unit Group sessions and Medication administration.  Evaluation of Outcomes: Progressing   9/4:Arshia reports, "I'm feeling really tired.They forced me to take the medicines that I don't want to take because it makes me real tired. My mood is terrible because no one gives a shit about me. This is making me upset everyday. [facility]. They have groups & I have other people that I can talk to that cared about me in the past. I feel claustrophobic here".  Mood control/depression: -  Continue Thorazine 50 mgBID -Continue Lexapro27m po Q day  Insomnia Continue with Trazodone 100 mg  Dm:  Continue sliding scale novolog Continue Lantus 35 units BID Encouraged carb counting as was suggested by patient   Physician Treatment Plan for Secondary Diagnosis: Principal Problem:   MDD (major depressive disorder), recurrent, severe, with psychosis ([place] Active Problems:   Borderline personality disorder ([place]   Long Term Goal(s): Improvement in symptoms so as ready for discharge  Short Term Goals: Ability to demonstrate self-control will improve Ability to identify and develop effective coping behaviors will improve Ability to maintain clinical measurements within normal limits will improve  Medication Management: Evaluate patient's response, side effects, and tolerance of medication regimen.  Therapeutic Interventions: 1 to 1 sessions, Unit Group  sessions and Medication administration.  Evaluation of Outcomes: Progressing   RN Treatment Plan for Primary Diagnosis: MDD (major depressive disorder), recurrent, severe, with psychosis [place] Long Term Goal(s): Knowledge of disease and therapeutic regimen to maintain health will improve  Short Term Goals: Ability to identify and develop effective coping behaviors will improve and Compliance with prescribed medications will improve  Medication Management: RN will administer medications as ordered by provider, will assess and evaluate patient's response and provide education to patient for prescribed medication. RN will report any adverse and/or side effects to prescribing provider.  Therapeutic Interventions: 1 on 1 counseling sessions, Psychoeducation, Medication administration, Evaluate responses to treatment, Monitor vital signs and CBGs as ordered, Perform/monitor CIWA, COWS, AIMS and Fall Risk screenings as ordered, Perform wound care treatments as ordered.  Evaluation of Outcomes: Progressing   LCSW Treatment Plan for Primary Diagnosis: MDD (major depressive disorder), recurrent, severe, with psychosis [place] Long Term Goal(s): Safe transition to appropriate next level of care at discharge, Engage patient in therapeutic group addressing interpersonal concerns.  Short Term Goals: Engage patient in aftercare planning with referrals and resources  Therapeutic Interventions: Assess for all discharge needs, 1 to 1 time with Social worker, Explore available resources and support systems, Assess for adequacy in community support network, Educate family and significant other(s) on suicide prevention, Complete Psychosocial Assessment, Interpersonal group therapy.  Evaluation of Outcomes: Met  Return home, follow up outp. 9/4: Pt states he is not welcome to return home.  [place]. Clinic for follow up to be  determined.   Progress in Treatment: Attending groups: No Participating in groups: No Taking medication as prescribed: Yes Toleration medication: Yes, no side effects reported at this time Family/Significant other contact made: No Patient understands diagnosis: No Limited insight Discussing patient identified problems/goals with staff: Yes Medical problems stabilized or resolved: Yes Denies suicidal/homicidal ideation: Yes Issues/concerns per patient self-inventory: None Other: N/A  New problem(s) identified: None identified at this time.   New Short Term/Long Term Goal(s): "I have no goals."   Discharge Plan or Barriers:   Reason for Continuation of Hospitalization:  Depression Medication stabilization Suicidal ideation   Estimated Length of Stay: 9/9  Attendees: Patient:  03/03/2018  9:19 AM  Physician: CMaris Berger MD 03/03/2018  9:19 AM  Nursing: RElesa MassedRN 03/03/2018  9:19 AM  RN Care Manager: JLars Pinks RN 03/03/2018  9:19 AM  Social Worker: RRipley Fraise9/09/2017  9:19 AM  Recreational Therapist: MWinfield Cunas9/09/2017  9:19 AM  Other: DNorberto Sorenson9/09/2017  9:19 AM  Other:  03/03/2018  9:19 AM    Scribe for Treatment Team:  Isabella LCSW 03/03/2018 9:19 AM

## 2018-03-03 NOTE — Progress Notes (Signed)
Inpatient Diabetes Program Recommendations  AACE/ADA: New Consensus Statement on Inpatient Glycemic Control (2015)  Target Ranges:  Prepandial:   less than 140 mg/dL      Peak postprandial:   less than 180 mg/dL (1-2 hours)      Critically ill patients:  140 - 180 mg/dL   Results for [name] [name], [name] [name] (MRN [id number]) as of 03/03/2018 08:23  Ref. Range 03/02/2018 06:10 03/02/2018 12:03 03/02/2018 17:04 03/02/2018 21:09 03/03/2018 06:13  Glucose-Capillary Latest Ref Range: 70 - 99 mg/dL 035 (H) Novolog 19 units given Lantus 35 units given 223 (H) Novolog 13 units given 387 (H) Novolog 23 units given 216 (H) 215 (H)    Review of Glycemic Control  Diabetes history: DM 1 Outpatient Diabetes medications: Lantus 35 units BID, Novolog 0-15 units tid Current orders for Inpatient glycemic control: Lantus 35 units BID, Novolog 0-15 units tid, Novolog 8 units tid meal coverage  Inpatient Diabetes Program Recommendations:    Fasting glucose better since patient didn't refuse insulin last night. Glucose trends still elevated. Patient also getting a lot of Novolog with each meal.   -  Consider increasing Lantus to 37 units BID  -  Increase Novolog meal coverage to 10 units tid in addition to correction scale  -  Consider also ordering Novolog 0-5 units qhs bedtime scale.  Thanks,  Christena Deem RN, MSN, BC-ADM Inpatient Diabetes Coordinator Team Pager [phone] (8a-5p)

## 2018-03-04 LAB — URINALYSIS, ROUTINE W REFLEX MICROSCOPIC
BILIRUBIN URINE: NEGATIVE
Bacteria, UA: NONE SEEN
KETONES UR: NEGATIVE mg/dL
Leukocytes, UA: NEGATIVE
NITRITE: NEGATIVE
PROTEIN: NEGATIVE mg/dL
Specific Gravity, Urine: 1.02 (ref 1.005–1.030)
pH: 8 (ref 5.0–8.0)

## 2018-03-04 LAB — GLUCOSE, CAPILLARY
Glucose-Capillary: 218 mg/dL — ABNORMAL HIGH (ref 70–99)
Glucose-Capillary: 265 mg/dL — ABNORMAL HIGH (ref 70–99)
Glucose-Capillary: 270 mg/dL — ABNORMAL HIGH (ref 70–99)
Glucose-Capillary: 199 mg/dL — ABNORMAL HIGH (ref 70–99)

## 2018-03-04 MED ORDER — CHLORPROMAZINE HCL 50 MG PO TABS
100.0000 mg | ORAL_TABLET | Freq: Every day | ORAL | Status: DC
  Administered 2018-03-04 – 2018-03-08 (×5): 100 mg via ORAL
  Filled 2018-03-04 (×2): qty 1
  Filled 2018-03-04: qty 2
  Filled 2018-03-04: qty 1
  Filled 2018-03-04: qty 14
  Filled 2018-03-04: qty 4
  Filled 2018-03-04: qty 1
  Filled 2018-03-04: qty 4
  Filled 2018-03-04 (×2): qty 2

## 2018-03-04 MED ORDER — TRAZODONE HCL 100 MG PO TABS
100.0000 mg | ORAL_TABLET | Freq: Every evening | ORAL | Status: DC | PRN
  Administered 2018-03-04 – 2018-03-08 (×5): 100 mg via ORAL
  Filled 2018-03-04: qty 1
  Filled 2018-03-04: qty 14
  Filled 2018-03-04 (×4): qty 1

## 2018-03-04 MED ORDER — FLUOXETINE HCL 20 MG PO CAPS
20.0000 mg | ORAL_CAPSULE | Freq: Every day | ORAL | Status: DC
  Administered 2018-03-04 – 2018-03-09 (×6): 20 mg via ORAL
  Filled 2018-03-04 (×6): qty 1
  Filled 2018-03-04: qty 7
  Filled 2018-03-04 (×2): qty 1

## 2018-03-04 NOTE — Progress Notes (Signed)
The patient verbalized in group that he had a good day overall since he was able to socialize with his peers. In addition, he shared that he was proud of the fact that he didn't "get mad" at anyone today. He was unable to share a goal for tomorrow.

## 2018-03-04 NOTE — Progress Notes (Signed)
Nursing note 7p-7a  Pt observed interacting with peers on unit this shift, playing around with peers Displayed a flat affect and silly mood upon interaction with this Clinical research associate. Pt denies pain ,denies SI/HI, and also denies any audio or visual hallucinations at this time. Pt complains of having loose stools but unable to show RN this shift. Pt is able to verbally contract for safety with this RN. Goal: "socialize more and work on my anger"  Pt is now resting in bed with eyes closed, with no signs or symptoms of pain or distress noted. Pt continues to remain safe on the unit and is observed by rounding every 15 min. RN will continue to monitor.

## 2018-03-04 NOTE — Progress Notes (Signed)
Recreation Therapy Notes  Date: 9.5.19 Time: 1000 Location: 500 Hall Dayroom  Group Topic: Triggers  Goal Area(s) Addresses:  Patient will identify triggers. Patient will identify ways in which they deal with triggers. Patient will verbalize benefits of knowing what their triggers are.   Intervention: Worksheet  Activity: Triggers.  Patients were given a worksheet to identify their top three trigger, what they do to avoid or reduce contact with triggers and how they deal with triggers head on.  Education: Communication, Discharge Planning  Education Outcome: Acknowledges understanding/In group clarification offered/Needs additional education.   Clinical Observations/Feedback: Pt did not attend group.     Caroll Rancher, LRT/CTRS         Caroll Rancher A 03/04/2018 11:38 AM

## 2018-03-04 NOTE — Progress Notes (Signed)
[facility] MD Progress Note  03/04/2018 11:50 AM [name] [name].  MRN:  [id number]  Subjective: [name] [name] reports, "I don't know how I'm doing. I did not take any medicines this morning. What is the point in taking medicines. You guys are going to discharge me on the streets anyway. How am I going to maintain taking medicines while living on the streets. It is impossible. Besides, I do not have medicaid. How am I going to be able to afford the medicines? I can't pay for them. [name] [name] had informed me earlier that it is easy to transfer me to a new county as far as my PO is able to approve it. I had found a place to go to earlier in [place], [place], but, came to find out that they had guns in the house. I can go there because of the weapons & my hx of suicide attempts".  [name] [name] is an 17 y/o M with history of MDD, personality disorder, ADHD, (and elsewhere schizophrenia), who was admitted from [facility] on [facility]C initiated at [facility] after pt was at his outpatient follow up appointment and verbalized suicidal ideation with plan and worsening depression. Pt was medically cleared at [facility] and then transferred to [facility] for additional treatment and stabilization. He was restarted on previous medication of thorazine, and then he was also started on trial of lexapro. Pt has been reporting incremental improvement of his presenting symptoms.  Today, [name] [name] is seen, chart reviewed. The chart findings discussed with the treatment team. He presents alert while lying down in his bed. His door was closed, all lights off. He presents with a flat affects, making eye contacts. He continues to say that he does not want to be in this hospital. Per patient's & staff reports, he refused all his morning medications today. He says for the fact that he is going to be discharged on the streets & lack of financial resources & no medicaid, he is not going to be able to afford the medications after discharge anyway. He continues to ruminate about the  way he is being treated by the staff, not being liked & having no support system. He continues to [facility] [place] because he feels he was well liked when he was there some time last year for 8 months. He adds today that [facility] has better resources for placements & housing young adult like him. Pt denies any physical complaints. He was tolerating his medications adequately when he took them previously. He denies SI/HI/AH/VH. Pt had explained previously that he cannot return to staying with his father, the discussion was to  investigate other alternative options with his probation officer and SW team. [name] [name] reports today that [name] [name] had informed him earlier that it is easy to transfer me to a new county as far as his PO is able to approve it. He adds, "I had found a place to go to earlier in [place], [place] after discharge, but, came to find out that they had guns in the house. I cannot go there because of the weapons & my hx of suicide attempts". [name] [name] is encouraged to take his medications & try to utilize the services being provided in the hospital. He is not visible on the unit. Pt is in agreement to continue his current regimen without changes today.  Principal Problem: MDD (major depressive disorder), recurrent, severe, with psychosis (HCC)  Diagnosis:   Patient Active Problem List   Diagnosis Date Noted  .  MDD (major depressive disorder), recurrent severe, without psychosis (HCC) [F33.2] 03/11/2015    Priority: High  . Borderline personality disorder (HCC) [F60.3] 02/26/2018  . MDD (major depressive disorder), recurrent, severe, with psychosis (HCC) [F33.3] 02/25/2018  . Major depressive disorder, recurrent severe without psychotic features (HCC) [F33.2] 07/24/2017  . DKA, type 1 (HCC) [E10.10] 07/16/2017  . Nausea & vomiting [R11.2] 07/16/2017  . Uncontrolled type 1 diabetes circulatory disorder erectile dysfunction (HCC) [E10.59, N52.1, E10.65]   . DKA  (diabetic ketoacidoses) (HCC) [E13.10] 12/18/2016  . History of seizures [Z87.898] 12/01/2016  . History of migraine [Z86.69] 12/01/2016  . Disordered eating [F50.9] 03/08/2015  . Ketonuria [R82.4]   . Adjustment reaction to medical therapy [F43.20]   . Non compliance w medication regimen [Z91.14]   . 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 Dehydration [E86.0] 08/22/2014  . Hyperglycemia due to type 1 diabetes mellitus (HCC) [E10.65] 08/21/2014  . Type 1 diabetes mellitus with hyperglycemia (HCC) [E10.65]   . Noncompliance [Z91.19]   . Generalized abdominal pain [R10.84]   . Glycosuria [R81]   . Depression [F32.9] 07/12/2014  . Involuntary movements [R25.9] 06/13/2014  . Bilateral leg pain [M79.604, M79.605] 06/13/2014  . 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 Suicidal ideation [R45.851] 03/31/2014  . ODD (oppositional defiant disorder) [F91.3] 03/03/2014  . MDD (major depressive disorder), recurrent episode, moderate (HCC) [F33.1] 02/16/2014  . 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 Microcytic anemia [D50.9] 02/15/2014  . Hyperglycemia [R73.9] 02/14/2014  . Goiter [E04.9] 02/04/2014  . 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 Acquired acanthosis nigricans [L83] 02/04/2014  . Obesity, morbid (HCC) [E66.01] 02/04/2014  . Insulin resistance [E88.81] 02/04/2014  . 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 Hypoglycemia associated with diabetes (HCC) [E11.649] 02/02/2014  . Maladaptive health behaviors affecting medical condition [F54] 02/02/2014  . Hypoglycemia [E16.2] 02/02/2014  . Type 1 diabetes mellitus in patient 20 to 19 years of age with hemoglobin A1c goal of less than 7.5% (HCC) [E10.9] 01/26/2014  . Partial epilepsy with impairment of  consciousness (HCC) [G40.209] 12/13/2013  . 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 Migraine without aura [G43.009] 12/13/2013  . Body mass index, pediatric, greater than or equal to 95th percentile for age [facility] 12/13/2013  . Asthma [J45.909] 12/13/2013  . Hypoglycemia unawareness in type 1 diabetes mellitus (HCC) [E10.649] 08/08/2013  . Seizure disorder (HCC) [G40.909] 07/06/2013  . Short-term memory loss [R41.3] 07/06/2013  . Sickle cell trait (HCC) [D57.3] 07/06/2013  . Diabetes (HCC) [E11.9] 06/17/2013  . Diabetes mellitus, new onset (HCC) [E11.9] 06/17/2013   Total Time spent with patient: 15 minutes  Past Psychiatric History: See H&P  Past Medical History:  Past Medical History:  Diagnosis Date  . ADD (attention deficit disorder)   . Allergy   . Anxiety   . Asthma   . Depression   . Epileptic seizure (HCC)    "both petit and grand mal; last sz ~ 2 wk ago" (06/17/2013)  . Headache(784.0)    "2-3 times/wk usually" (06/17/2013)  . Heart murmur    "heard a slight one earlier today" (06/17/2013)  . Migraine    "maybe once/month; it's severe" (06/17/2013)  . ODD (oppositional defiant disorder)   . Sickle cell trait (HCC)   . Type I diabetes mellitus (HCC)    "that's what they're thinking now" (06/17/2013)  . Vision  abnormalities    "takes him longer to focus cause; from his sz" (06/17/2013)    Past Surgical History:  Procedure Laterality Date  . CIRCUMCISION  2000  . FINGER SURGERY Left 2001   "crushed pinky; had to repair it" (06/17/2013)   Family History:  Family History  Problem Relation Age of Onset  . Asthma Mother   . COPD Mother   . Other Mother        possible autoimmune, unclear  . Diabetes Maternal Grandmother   . Heart disease Maternal Grandmother   . Hypertension Maternal Grandmother   . Mental illness Maternal Grandmother   . Heart disease Maternal Grandfather   . Hyperlipidemia Paternal Grandmother   . Hyperlipidemia  Paternal Grandfather   . Migraines Sister        Hemiplegic Migraines    Family Psychiatric  History: See H&P  Social History:  Social History   Substance and Sexual Activity  Alcohol Use No  . Alcohol/week: 0.0 standard drinks     Social History   Substance and Sexual Activity  Drug Use No    Social History   Socioeconomic History  . Marital status: Single    Spouse name: Not on file  . Number of children: Not on file  . Years of education: Not on file  . Highest education level: Not on file  Occupational History  . Not on file  Social Needs  . Financial resource strain: Not on file  . Food insecurity:    Worry: Not on file    Inability: Not on file  . Transportation needs:    Medical: Not on file    Non-medical: Not on file  Tobacco Use  . Smoking status: Passive Smoke Exposure - Never Smoker  . Smokeless tobacco: Never Used  . Tobacco comment: Mom and dad smoke outside   Substance and Sexual Activity  . Alcohol use: No    Alcohol/week: 0.0 standard drinks  . Drug use: No  . Sexual activity: Never  Lifestyle  . Physical activity:    Days per week: Not on file    Minutes per session: Not on file  . Stress: Not on file  Relationships  . Social connections:    Talks on phone: Not on file    Gets together: Not on file    Attends religious service: Not on file    Active member of club or organization: Not on file    Attends meetings of clubs or organizations: Not on file    Relationship status: Not on file  Other Topics Concern  . Not on file  Social History Narrative   Lives at home with father, stepmother, 16 year old brother.   Additional Social History:   Sleep: Good  Appetite:  Good  Current Medications: Current Facility-Administered Medications  Medication Dose Route Frequency Provider Last Rate Last Dose  . acetaminophen (TYLENOL) tablet 650 mg  650 mg Oral Q6H PRN Money, Gerlene Burdock, FNP   650 mg at 02/26/18 2009  . benztropine (COGENTIN)  tablet 1 mg  1 mg Oral BID PRN Micheal Likens, MD       Or  . benztropine mesylate (COGENTIN) injection 1 mg  1 mg Intramuscular BID PRN Micheal Likens, MD   1 mg at 03/02/18 0741  . chlorproMAZINE (THORAZINE) tablet 50 mg  50 mg Oral Q6H PRN Micheal Likens, MD   50 mg at 03/01/18 1403   Or  . chlorproMAZINE (THORAZINE) injection 50 mg  50 mg Intramuscular Q6H PRN Micheal Likens, MD   50 mg at 03/02/18 0741  . chlorproMAZINE (THORAZINE) tablet 50 mg  50 mg Oral BID Micheal Likens, MD   50 mg at 03/03/18 0811  . escitalopram (LEXAPRO) tablet 5 mg  5 mg Oral Daily Oneta Rack, NP   5 mg at 03/03/18 0810  . hydrOXYzine (ATARAX/VISTARIL) tablet 50 mg  50 mg Oral Q6H PRN Micheal Likens, MD   50 mg at 03/01/18 1403  . insulin aspart (novoLOG) injection 0-15 Units  0-15 Units Subcutaneous TID WC Rankin, [name], NP   5 Units at 03/04/18 0639  . insulin aspart (novoLOG) injection 8 Units  8 Units Subcutaneous TID WC Rankin, [name], NP   8 Units at 03/04/18 1610  . insulin glargine (LANTUS) injection 35 Units  35 Units Subcutaneous BID Antonieta Pert, MD   35 Units at 03/03/18 1721  . LORazepam (ATIVAN) tablet 1 mg  1 mg Oral Q6H PRN Micheal Likens, MD   1 mg at 03/01/18 1403   Or  . LORazepam (ATIVAN) injection 1 mg  1 mg Intramuscular Q6H PRN Micheal Likens, MD      . nicotine (NICODERM CQ - dosed in mg/24 hours) patch 21 mg  21 mg Transdermal Daily Micheal Likens, MD   21 mg at 03/03/18 0810  . traZODone (DESYREL) tablet 100 mg  100 mg Oral QHS,MR X 1 [name] A, NP   100 mg at 03/02/18 2337   Lab Results:  Results for orders placed or performed during the hospital encounter of 02/25/18 (from the past 48 hour(s))  Glucose, capillary     Status: Abnormal   Collection Time: 03/02/18 12:03 PM  Result Value Ref Range   Glucose-Capillary 223 (H) 70 - 99 mg/dL   Comment 1 Notify RN    Comment 2  Document in Chart   Glucose, capillary     Status: Abnormal   Collection Time: 03/02/18  5:04 PM  Result Value Ref Range   Glucose-Capillary 387 (H) 70 - 99 mg/dL   Comment 1 Notify RN    Comment 2 Document in Chart   Glucose, capillary     Status: Abnormal   Collection Time: 03/02/18  9:09 PM  Result Value Ref Range   Glucose-Capillary 216 (H) 70 - 99 mg/dL  Glucose, capillary     Status: Abnormal   Collection Time: 03/03/18  6:13 AM  Result Value Ref Range   Glucose-Capillary 215 (H) 70 - 99 mg/dL  Glucose, capillary     Status: Abnormal   Collection Time: 03/03/18 12:01 PM  Result Value Ref Range   Glucose-Capillary 245 (H) 70 - 99 mg/dL  Glucose, capillary     Status: Abnormal   Collection Time: 03/03/18  4:20 PM  Result Value Ref Range   Glucose-Capillary 357 (H) 70 - 99 mg/dL   Comment 1 Notify RN    Comment 2 Document in Chart   Glucose, capillary     Status: Abnormal   Collection Time: 03/03/18  8:01 PM  Result Value Ref Range   Glucose-Capillary 193 (H) 70 - 99 mg/dL   Comment 1 Notify RN    Comment 2 Document in Chart   Glucose, capillary     Status: Abnormal   Collection Time: 03/04/18  6:16 AM  Result Value Ref Range   Glucose-Capillary 218 (H) 70 - 99 mg/dL   Blood Alcohol level:  Lab Results  Component Value Date   ETH <10 02/23/2018   ETH <10 08/14/2017   Metabolic Disorder Labs: Lab Results  Component Value Date   HGBA1C 10.1 (H) 01/26/2017   MPG 243 01/26/2017   MPG 263 12/18/2016   No results found for: PROLACTIN Lab Results  Component Value Date   TRIG 244 (H) 12/18/2016   Physical Findings: AIMS: Facial and Oral Movements Muscles of Facial Expression: None, normal Lips and Perioral Area: None, normal Jaw: None, normal Tongue: None, normal,Extremity Movements Upper (arms, wrists, hands, fingers): None, normal Lower (legs, knees, ankles, toes): None, normal, Trunk Movements Neck, shoulders, hips: None, normal, Overall Severity Severity  of abnormal movements (highest score from questions above): None, normal Incapacitation due to abnormal movements: None, normal Patient's awareness of abnormal movements (rate only patient's report): No Awareness, Dental Status Current problems with teeth and/or dentures?: No Does patient usually wear dentures?: No  CIWA:    COWS:     Musculoskeletal: Strength & Muscle Tone: within normal limits Gait & Station: normal Patient leans: N/A  Psychiatric Specialty Exam: Physical Exam  Nursing note and vitals reviewed.   Review of Systems  Constitutional: Negative for chills and fever.  Respiratory: Negative for cough and shortness of breath.   Cardiovascular: Negative for chest pain.  Gastrointestinal: Negative for abdominal pain, heartburn, nausea and vomiting.  Psychiatric/Behavioral: Positive for depression. Negative for hallucinations and suicidal ideas. The patient is nervous/anxious. The patient does not have insomnia.     Blood pressure 129/82, pulse 89, temperature 99 F (37.2 C), temperature source Oral, resp. rate 18, height 6\' 3"  (1.905 m), weight 89.8 kg, SpO2 100 %.Body mass index is 24.75 kg/m.  General Appearance: Casual and Fairly Groomed  Eye Contact:  Good  Speech:  Clear and Coherent and Normal Rate  Volume:  Normal  Mood:  Irritable  Affect:  Appropriate, Congruent and Constricted  Thought Process:  Coherent and Goal Directed  Orientation:  Full (Time, Place, and Person)  Thought Content:  Logical  Suicidal Thoughts:  No  Homicidal Thoughts:  No  Memory:  Immediate;   Fair Recent;   Fair Remote;   Fair  Judgement:  Fair  Insight:  Fair  Psychomotor Activity:  Normal  Concentration:  Concentration: Fair  Recall:  [employer] of Knowledge:  Fair  Language:  Fair  Akathisia:  No  Handed:    AIMS (if indicated):     Assets:  Resilience Social Support  ADL's:  Intact  Cognition:  WNL  Sleep:  Number of Hours: 5.25   Treatment Plan Summary: Daily  contact with patient to assess and evaluate symptoms and progress in treatment and Medication management   Continue inpatient hospitalization.  Will continue today 03/04/2018 plan as below except where it is noted.  Mood control/depression: -Continue Thorazine 50 mg BID   -Continue Lexapro 5  mg po Q day  Insomnia             Continue with Trazodone 100 mg  Dm:  Continue sliding scale novolog Continue Lantus 35 units BID Encouraged carb counting as was suggested by patient   Will continue to monitor vitals, medication compliance and treatment side effects while patient is here.   CSW will continue working on dispositionand patient to consider group home placement.   Patient to participate in therapeutic milieu  Armandina Stammer, NP, PMHNP, FNP-BC. 03/04/2018, 11:50 AMPatient ID: [name] [name]., male   DOB: [birth year], 17 y.o.   MRN: [id number]

## 2018-03-04 NOTE — Plan of Care (Signed)
  Problem: Education: Goal: Knowledge of [place] [facility] Education information/materials will improve Outcome: Progressing Goal: Emotional status will improve Outcome: Progressing Goal: Mental status will improve Outcome: Progressing Goal: Verbalization of understanding the information provided will improve Outcome: Progressing   Problem: Activity: Goal: Interest or engagement in activities will improve Outcome: Progressing Goal: Sleeping patterns will improve Outcome: Progressing   Problem: Coping: Goal: Ability to verbalize frustrations and anger appropriately will improve Outcome: Progressing Goal: Ability to demonstrate self-control will improve Outcome: Progressing   Problem: Health Behavior/Discharge Planning: Goal: Identification of resources available to assist in meeting health care needs will improve Outcome: Progressing Goal: Compliance with treatment plan for underlying cause of condition will improve Outcome: Progressing   Problem: Physical Regulation: Goal: Ability to maintain clinical measurements within normal limits will improve Outcome: Progressing   Problem: Safety: Goal: Periods of time without injury will increase Outcome: Progressing   Problem: Education: Goal: Knowledge of [place] [facility] Education information/materials will improve Outcome: Progressing Goal: Emotional status will improve Outcome: Progressing Goal: Mental status will improve Outcome: Progressing Goal: Verbalization of understanding the information provided will improve Outcome: Progressing   Problem: Health Behavior/Discharge Planning: Goal: Identification of resources available to assist in meeting health care needs will improve Outcome: Progressing Goal: Compliance with treatment plan for underlying cause of condition will improve Outcome: Progressing   Problem: Safety: Goal: Ability to disclose and discuss suicidal ideas will improve Outcome: Progressing Goal:  Ability to identify and utilize support systems that promote safety will improve Outcome: Progressing   Problem: Education: Goal: Ability to make informed decisions regarding treatment will improve Outcome: Progressing   Problem: Medication: Goal: Compliance with prescribed medication regimen will improve Outcome: Progressing   Problem: Coping: Goal: Ability to identify and develop effective coping behavior will improve Outcome: Progressing Goal: Ability to interact with others will improve Outcome: Progressing Goal: Demonstration of participation in decision-making regarding own care will improve Outcome: Progressing Goal: Ability to use eye contact when communicating with others will improve Outcome: Progressing

## 2018-03-05 DIAGNOSIS — R451 Restlessness and agitation: Secondary | ICD-10-CM

## 2018-03-05 LAB — GLUCOSE, CAPILLARY
GLUCOSE-CAPILLARY: 269 mg/dL — AB (ref 70–99)
Glucose-Capillary: 294 mg/dL — ABNORMAL HIGH (ref 70–99)
Glucose-Capillary: 311 mg/dL — ABNORMAL HIGH (ref 70–99)
Glucose-Capillary: 382 mg/dL — ABNORMAL HIGH (ref 70–99)

## 2018-03-05 NOTE — Progress Notes (Signed)
Recreation Therapy Notes  Date: 9.6.19 Time: 1000 Location: 500 Hall Dayroom   Group Topic: Communication, Team Building, Problem Solving  Goal Area(s) Addresses:  Patient will effectively work with peer towards shared goal.  Patient will identify skills used to make activity successful.  Patient will identify how skills used during activity can be used to reach post d/c goals.  Intervention: STEM Activity  Activity: Stage manager. In teams patients were given 12 plastic drinking straws and a length of masking tape. Using the materials provided patients were asked to build a landing pad to catch a golf ball dropped from approximately 6 feet in the air.   Education: Pharmacist, community, Discharge Planning   Education Outcome: Acknowledges education/In group clarification offered/Needs additional education.   Clinical Observations/Feedback: Pt did not attend group.    Caroll Rancher, LRT/CTRS         Caroll Rancher A 03/05/2018 12:26 PM

## 2018-03-05 NOTE — Progress Notes (Signed)
Patient slept most of the morning.  Patient denied SI and HI, contracts for safety.  Denied A/V hallucinations.  Denied pain.  Medications administered per MD orders.  Emotional support and encouragement given patient.   Safety maintained with 15 minute checks. Patient went to dining room for meals.  No behavior issue on the unit noted today.  Patient went went to recreation activity today.

## 2018-03-05 NOTE — [facility] Group Notes (Signed)
[facility] LCSW Group Therapy  03/05/2018 3:26 PM  Type of Therapy:  Group Therapy  Participation Level:  Did Not Attend  Participation Quality:  Did not attend  Affect:  Did not attend  Cognitive:  Did not attend  Insight:  Did not attend  Engagement in Therapy:  Did not attend  Modes of Intervention:  Did not attend  Summary of Progress/Problems:  Did not attend  Cherie Bohaboy 03/05/2018, 3:26 PM

## 2018-03-05 NOTE — Progress Notes (Signed)
Inpatient Diabetes Program Recommendations  AACE/ADA: New Consensus Statement on Inpatient Glycemic Control (2015)  Target Ranges:  Prepandial:   less than 140 mg/dL      Peak postprandial:   less than 180 mg/dL (1-2 hours)      Critically ill patients:  140 - 180 mg/dL   Results for [name] [name], [name] [name] (MRN [id number]) as of 03/05/2018 08:28  Ref. Range 03/03/2018 06:13 03/03/2018 12:01 03/03/2018 16:20 03/03/2018 20:01  Glucose-Capillary Latest Ref Range: 70 - 99 mg/dL 286 (H) 381 (H) 771 (H) 193 (H)   Results for [name] [name], [name] [name] (MRN [id number]) as of 03/05/2018 08:28  Ref. Range 03/04/2018 06:16 03/04/2018 11:58 03/04/2018 16:46 03/04/2018 20:41 03/05/2018 05:52  Glucose-Capillary Latest Ref Range: 70 - 99 mg/dL 338 (H) 329 (H) 191 (H) 199 (H) 294 (H)   Review of Glycemic Control  Diabetes history: DM 1 Outpatient Diabetes medications: Lantus 35 units BID, Novolog 0-15 units tid Current orders for Inpatient glycemic control: Lantus 35 units BID, Novolog 0-15 units tid, Novolog 8 units tid meal coverage  Inpatient Diabetes Program Recommendations:     -  Consider increasing Lantus to 37 units BID  -  Increase Novolog meal coverage to 10 units tid in addition to correction scale  -  Consider also ordering Novolog 0-5 units qhs bedtime scale.  Thanks,  Christena Deem RN, MSN, BC-ADM Inpatient Diabetes Coordinator Team Pager [phone] (8a-5p)

## 2018-03-05 NOTE — Plan of Care (Signed)
Nurse discussed anxiety, depression and coping skills with patient.  
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
## 2018-03-06 DIAGNOSIS — G259 Extrapyramidal and movement disorder, unspecified: Secondary | ICD-10-CM

## 2018-03-06 DIAGNOSIS — F909 Attention-deficit hyperactivity disorder, unspecified type: Secondary | ICD-10-CM

## 2018-03-06 DIAGNOSIS — F609 Personality disorder, unspecified: Secondary | ICD-10-CM

## 2018-03-06 DIAGNOSIS — Z794 Long term (current) use of insulin: Secondary | ICD-10-CM

## 2018-03-06 DIAGNOSIS — Z79899 Other long term (current) drug therapy: Secondary | ICD-10-CM

## 2018-03-06 LAB — GLUCOSE, CAPILLARY
GLUCOSE-CAPILLARY: 153 mg/dL — AB (ref 70–99)
Glucose-Capillary: 223 mg/dL — ABNORMAL HIGH (ref 70–99)
Glucose-Capillary: 237 mg/dL — ABNORMAL HIGH (ref 70–99)
Glucose-Capillary: 362 mg/dL — ABNORMAL HIGH (ref 70–99)

## 2018-03-06 NOTE — Progress Notes (Addendum)
[facility] MD Progress Note  03/06/2018 9:19 AM [name] [name] xxxVarela [name].  MRN:  [id number] Subjective:   [name] [name] is observed resting in bed.  He is awake alert oriented x3.  Presents flat and guarded during this assessment.  Reports " I am alright" denies suicidal or homicidal ideations.  Reports he is resting well throughout the night.  Reports a good appetite.  States he has not been attending daily group sessions as he does not want to program on this hall.  Rates his depression 3 out of 10 with 10 being the worst.  Reports taking and tolerating medications well. Reports he is looking forward to going to court on Tuesday, in ordered to be discharged to the streets. Support encouragement reassurance was provided.    History:[name] [name] is an 17 y/o M with history of MDD, personality disorder, ADHD, (and elsewhere schizophrenia), who was [facility] from [facility] on [facility] initiated at [facility] after pt was at his outpatient follow up appointment and verbalized suicidal ideation with plan and worsening depression. Pt was medically cleared at [facility] and then transferred to [facility] for additional treatment and stabilization. He was restarted on previous medication of thorazine, and then he was also started on trial of lexapro. Pt has been reporting incremental improvement of his presenting symptoms. He was changed from lexapro to prozac after he restarted refusing lexapro.     Principal Problem: MDD (major depressive disorder), recurrent, severe, with psychosis (HCC) Diagnosis:   Patient Active Problem List   Diagnosis Date Noted  . Borderline personality disorder (HCC) [F60.3] 02/26/2018  . MDD (major depressive disorder), recurrent, severe, with psychosis (HCC) [F33.3] 02/25/2018  . Major depressive disorder, recurrent severe without psychotic features (HCC) [F33.2] 07/24/2017  . DKA, type 1 (HCC) [E10.10] 07/16/2017  . Nausea & vomiting [R11.2] 07/16/2017  . Uncontrolled type 1 diabetes circulatory disorder erectile  dysfunction (HCC) [E10.59, N52.1, E10.65]   . DKA (diabetic ketoacidoses) (HCC) [E13.10] 12/18/2016  . History of seizures [Z87.898] 12/01/2016  . History of migraine [Z86.69] 12/01/2016  . MDD (major depressive disorder), recurrent severe, without psychosis (HCC) [F33.2] 03/11/2015  . Disordered eating [F50.9] 03/08/2015  . Ketonuria [R82.4]   . Adjustment reaction to medical therapy [F43.20]   . Non compliance w medication regimen [Z91.14]   . 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 Dehydration [E86.0] 08/22/2014  . Hyperglycemia due to type 1 diabetes mellitus (HCC) [E10.65] 08/21/2014  . Type 1 diabetes mellitus with hyperglycemia (HCC) [E10.65]   . Noncompliance [Z91.19]   . Generalized abdominal pain [R10.84]   . Glycosuria [R81]   . Depression [F32.9] 07/12/2014  . Involuntary movements [R25.9] 06/13/2014  . Bilateral leg pain [M79.604, M79.605] 06/13/2014  . 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 Suicidal ideation [R45.851] 03/31/2014  . ODD (oppositional defiant disorder) [F91.3] 03/03/2014  . MDD (major depressive disorder), recurrent episode, moderate (HCC) [F33.1] 02/16/2014  . 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 Microcytic anemia [D50.9] 02/15/2014  . Hyperglycemia [R73.9] 02/14/2014  . Goiter [E04.9] 02/04/2014  . 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 Acquired acanthosis nigricans [L83] 02/04/2014  . Obesity, morbid (HCC) [E66.01] 02/04/2014  . Insulin resistance [E88.81] 02/04/2014  . 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 Hypoglycemia associated with diabetes (HCC) [E11.649] 02/02/2014  . Maladaptive health behaviors affecting medical condition [F54] 02/02/2014  . Hypoglycemia [E16.2] 02/02/2014  . Type  1 diabetes mellitus in  patient 70 to 19 years of age with hemoglobin A1c goal of less than 7.5% (HCC) [E10.9] 01/26/2014  . 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 Migraine without aura [G43.009] 12/13/2013  . Body mass index, pediatric, greater than or equal to 95th percentile for age [facility] 12/13/2013  . Asthma [J45.909] 12/13/2013  . Hypoglycemia unawareness in type 1 diabetes mellitus (HCC) [E10.649] 08/08/2013  . Seizure disorder (HCC) [G40.909] 07/06/2013  . Short-term memory loss [R41.3] 07/06/2013  . Sickle cell trait (HCC) [D57.3] 07/06/2013  . Diabetes (HCC) [E11.9] 06/17/2013  . Diabetes mellitus, new onset (HCC) [E11.9] 06/17/2013   Total Time spent with patient: 30 minutes  Past Psychiatric History: see H&P  Past Medical History:  Past Medical History:  Diagnosis Date  . ADD (attention deficit disorder)   . Allergy   . Anxiety   . Asthma   . Depression   . Epileptic seizure (HCC)    "both petit and grand mal; last sz ~ 2 wk ago" (06/17/2013)  . Headache(784.0)    "2-3 times/wk usually" (06/17/2013)  . Heart murmur    "heard a slight one earlier today" (06/17/2013)  . Migraine    "maybe once/month; it's severe" (06/17/2013)  . ODD (oppositional defiant disorder)   . Sickle cell trait (HCC)   . Type I diabetes mellitus (HCC)    "that's what they're thinking now" (06/17/2013)  . Vision abnormalities    "takes him longer to focus cause; from his sz" (06/17/2013)    Past Surgical History:  Procedure Laterality Date  . CIRCUMCISION  2000  . FINGER SURGERY Left 2001   "crushed pinky; had to repair it" (06/17/2013)   Family History:  Family History  Problem Relation Age of Onset  . Asthma Mother   . COPD Mother   . Other Mother        possible autoimmune, unclear  . Diabetes Maternal Grandmother   . Heart disease Maternal Grandmother   . Hypertension Maternal Grandmother   .  Mental illness Maternal Grandmother   . Heart disease Maternal Grandfather   . Hyperlipidemia Paternal Grandmother   . Hyperlipidemia Paternal Grandfather   . Migraines Sister        Hemiplegic Migraines    Family Psychiatric  History: see H&P Social History:  Social History   Substance and Sexual Activity  Alcohol Use No  . Alcohol/week: 0.0 standard drinks     Social History   Substance and Sexual Activity  Drug Use No    Social History   Socioeconomic History  . Marital status: Single    Spouse name: Not on file  . Number of children: Not on file  . Years of education: Not on file  . Highest education level: Not on file  Occupational History  . Not on file  Social Needs  . Financial resource strain: Not on file  . Food insecurity:    Worry: Not on file    Inability: Not on file  . Transportation needs:    Medical: Not on file    Non-medical: Not on file  Tobacco Use  . Smoking status: Passive Smoke Exposure - Never Smoker  . Smokeless tobacco: Never Used  . Tobacco comment: Mom and dad smoke outside   Substance and Sexual Activity  . Alcohol use: No    Alcohol/week: 0.0 standard drinks  . Drug use: No  . Sexual activity: Never  Lifestyle  . Physical activity:    Days per week: Not on file    Minutes per session: Not on file  . Stress: Not on file  Relationships  . Social connections:    Talks on phone: Not on file    Gets together: Not on file    Attends religious service: Not on file    Active member of club or organization: Not on file    Attends meetings of clubs or organizations: Not on file    Relationship status: Not on file  Other Topics Concern  . Not on file  Social History Narrative   Lives at home with father, stepmother, 34 year old brother.   Additional Social History:                         Sleep: Good  Appetite:  Good  Current Medications: Current Facility-Administered Medications  Medication Dose Route Frequency  Provider Last Rate Last Dose  . acetaminophen (TYLENOL) tablet 650 mg  650 mg Oral Q6H PRN Money, Gerlene Burdock, FNP   650 mg at 03/05/18 1611  . benztropine (COGENTIN) tablet 1 mg  1 mg Oral BID PRN Micheal Likens, MD       Or  . benztropine mesylate (COGENTIN) injection 1 mg  1 mg Intramuscular BID PRN Micheal Likens, MD   1 mg at 03/02/18 0741  . chlorproMAZINE (THORAZINE) tablet 50 mg  50 mg Oral Q6H PRN Micheal Likens, MD   50 mg at 03/01/18 1403   Or  . chlorproMAZINE (THORAZINE) injection 50 mg  50 mg Intramuscular Q6H PRN Micheal Likens, MD   50 mg at 03/02/18 0741  . chlorproMAZINE (THORAZINE) tablet 100 mg  100 mg Oral QHS Micheal Likens, MD   100 mg at 03/05/18 2100  . FLUoxetine (PROZAC) capsule 20 mg  20 mg Oral Daily Micheal Likens, MD   20 mg at 03/05/18 0757  . hydrOXYzine (ATARAX/VISTARIL) tablet 50 mg  50 mg Oral Q6H PRN Micheal Likens, MD   50 mg at 03/05/18 2054  . insulin aspart (novoLOG) injection 0-15 Units  0-15 Units Subcutaneous TID WC Rankin, [name], NP   5 Units at 03/06/18 0645  . insulin aspart (novoLOG) injection 8 Units  8 Units Subcutaneous TID WC Rankin, [name], NP   8 Units at 03/06/18 0645  . insulin glargine (LANTUS) injection 35 Units  35 Units Subcutaneous BID Antonieta Pert, MD   35 Units at 03/05/18 1719  . LORazepam (ATIVAN) tablet 1 mg  1 mg Oral Q6H PRN Micheal Likens, MD   1 mg at 03/01/18 1403   Or  . LORazepam (ATIVAN) injection 1 mg  1 mg Intramuscular Q6H PRN Micheal Likens, MD      . nicotine (NICODERM CQ - dosed in mg/24 hours) patch 21 mg  21 mg Transdermal Daily Micheal Likens, MD   21 mg at 03/03/18 0810  . traZODone (DESYREL) tablet 100 mg  100 mg Oral QHS PRN,MR X 1 [place], Burlene Arnt, MD   100 mg at 03/05/18 2054    Lab Results:  Results for orders placed or performed during the hospital encounter of 02/25/18 (from the past 48  hour(s))  Glucose, capillary     Status: Abnormal   Collection Time: 03/04/18 11:58 AM  Result Value Ref Range   Glucose-Capillary 265 (H) 70 - 99 mg/dL   Comment 1 Notify  RN    Comment 2 Document in Chart   Glucose, capillary     Status: Abnormal   Collection Time: 03/04/18  4:46 PM  Result Value Ref Range   Glucose-Capillary 270 (H) 70 - 99 mg/dL  Urinalysis, Routine w reflex microscopic     Status: Abnormal   Collection Time: 03/04/18  5:08 PM  Result Value Ref Range   Color, Urine STRAW (A) YELLOW   APPearance CLEAR CLEAR   Specific Gravity, Urine 1.020 1.005 - 1.030   pH 8.0 5.0 - 8.0   Glucose, UA >=500 (A) NEGATIVE mg/dL   Hgb urine dipstick LARGE (A) NEGATIVE   Bilirubin Urine NEGATIVE NEGATIVE   Ketones, ur NEGATIVE NEGATIVE mg/dL   Protein, ur NEGATIVE NEGATIVE mg/dL   Nitrite NEGATIVE NEGATIVE   Leukocytes, UA NEGATIVE NEGATIVE   RBC / HPF 21-50 0 - 5 RBC/hpf   WBC, UA 6-10 0 - 5 WBC/hpf   Bacteria, UA NONE SEEN NONE SEEN    Comment: Performed at [facility], [address], [place], [place] [address]  Glucose, capillary     Status: Abnormal   Collection Time: 03/04/18  8:41 PM  Result Value Ref Range   Glucose-Capillary 199 (H) 70 - 99 mg/dL   Comment 1 Notify RN   Glucose, capillary     Status: Abnormal   Collection Time: 03/05/18  5:52 AM  Result Value Ref Range   Glucose-Capillary 294 (H) 70 - 99 mg/dL   Comment 1 Notify RN   Glucose, capillary     Status: Abnormal   Collection Time: 03/05/18 11:56 AM  Result Value Ref Range   Glucose-Capillary 311 (H) 70 - 99 mg/dL   Comment 1 QC Due   Glucose, capillary     Status: Abnormal   Collection Time: 03/05/18  5:01 PM  Result Value Ref Range   Glucose-Capillary 382 (H) 70 - 99 mg/dL  Glucose, capillary     Status: Abnormal   Collection Time: 03/05/18  8:41 PM  Result Value Ref Range   Glucose-Capillary 269 (H) 70 - 99 mg/dL   Comment 1 Notify RN   Glucose, capillary     Status:  Abnormal   Collection Time: 03/06/18  6:19 AM  Result Value Ref Range   Glucose-Capillary 223 (H) 70 - 99 mg/dL    Blood Alcohol level:  Lab Results  Component Value Date   ETH <10 02/23/2018   ETH <10 08/14/2017    Metabolic Disorder Labs: Lab Results  Component Value Date   HGBA1C 10.1 (H) 01/26/2017   MPG 243 01/26/2017   MPG 263 12/18/2016   No results found for: PROLACTIN Lab Results  Component Value Date   TRIG 244 (H) 12/18/2016    Physical Findings: AIMS: Facial and Oral Movements Muscles of Facial Expression: None, normal Lips and Perioral Area: None, normal Jaw: None, normal Tongue: None, normal,Extremity Movements Upper (arms, wrists, hands, fingers): None, normal Lower (legs, knees, ankles, toes): None, normal, Trunk Movements Neck, shoulders, hips: None, normal, Overall Severity Severity of abnormal movements (highest score from questions above): None, normal Incapacitation due to abnormal movements: None, normal Patient's awareness of abnormal movements (rate only patient's report): No Awareness, Dental Status Current problems with teeth and/or dentures?: No Does patient usually wear dentures?: No  CIWA:  CIWA-Ar Total: 1 COWS:     Musculoskeletal: Strength & Muscle Tone: within normal limits Gait & Station: normal Patient leans: N/A  Psychiatric Specialty Exam: Physical Exam  Nursing note  and vitals reviewed. Constitutional: He is oriented to person, place, and time. He appears well-developed.  Neurological: He is alert and oriented to person, place, and time.  Psychiatric: He has a normal mood and affect. His behavior is normal.    Review of Systems  Psychiatric/Behavioral: Positive for depression. Negative for hallucinations and suicidal ideas. The patient is not nervous/anxious and does not have insomnia.   All other systems reviewed and are negative.   Blood pressure 129/82, pulse 89, temperature 99 F (37.2 C), temperature source Oral,  resp. rate 18, height 6\' 3"  (1.905 m), weight 89.8 kg, SpO2 100 %.Body mass index is 24.75 kg/m.  General Appearance: Casual and Fairly Groomed  Eye Contact:  Poor  Speech:  Clear and Coherent and Normal Rate  Volume:  Decreased  Mood:  Depressed and Irritable  Affect:  Congruent, Constricted, Depressed and Flat  Thought Process:  Coherent and Goal Directed  Orientation:  Full (Time, Place, and Person)  Thought Content:  Paranoid Ideation  Suicidal Thoughts:  No  Homicidal Thoughts:  No  Memory:  Immediate;   Fair Recent;   Fair Remote;   Fair  Judgement:  Poor  Insight:  Lacking  Psychomotor Activity:  Normal  Concentration:  Concentration: Fair  Recall:  [employer] of Knowledge:  Fair  Language:  Fair  Akathisia:  No  Handed:    AIMS (if indicated):     Assets:  Housing Resilience Social Support  ADL's:  Intact  Cognition:  WNL  Sleep:  Number of Hours: 6.5   Treatment Plan Summary: Daily contact with patient to assess and evaluate symptoms and progress in treatment and Medication management   Will continue treatment plan as listed below on  03/06/2018 except where it is noted.  Mood control/depression: -Continue Thorazine 100mg  po qhs -Continue prozac 20mg  po qday  Insomnia -Continue with Trazodone 100 mg po qhs prn insomnia (may repeat x1 prn ongoing insomnia)  Agitation     -Continue thorazine 50mg  po/IM q6h prn agitation   -Continue ativan 1mg  po/IM q6h prn agitation  EPS  -Continue cogentin 1mg  po/IM q12h prn EPS  Anxiety    -Continue vistaril 50mg  po q6h prn anxiety  Dm:  -Continue sliding scale novolog -Continue Lantus 35 units BID -Encouraged carb counting as was suggested by patient   Will continue to monitor vitals, medication compliance and treatment side effects while patient is here.   CSW will continue working on dispositionand patient to consider group home  placement.   Patient to participate in therapeutic milieu   Oneta Rack, NP 03/06/2018, 9:19 AM ..Marland KitchenAgree with NP Progress Note

## 2018-03-06 NOTE — Progress Notes (Signed)
Psychoeducational Group Note  Date:  03/06/2018 Time:  2050  Group Topic/Focus:  Wrap-Up Group:   The focus of this group is to help patients review their daily goal of treatment and discuss progress on daily workbooks.  Participation Level: Did Not Attend  Participation Quality:  Not Applicable  Affect:  Not Applicable  Cognitive:  Not Applicable  Insight:  Not Applicable  Engagement in Group: Not Applicable  Additional Comments:  The patient did not attend group since he elected to return to his room. He shared with this Thereasa Parkin that he was upset since his brother did not come in to visit him this evening.   Hazle Coca S 03/06/2018, 8:50 PM

## 2018-03-06 NOTE — [facility] Group Notes (Signed)
.   [facility] LCSW Group Therapy Note  Date/Time:  03/06/2018 11:15AM-12:00PM  Type of Therapy and Topic:  Group Therapy:  Feelings About Hospitalization  Participation Level:  Minimal   Description of Group This process group involved patients discussing their feelings related to being hospitalized, as well as the benefits they see to being in the hospital.  These feelings and benefits were itemized.  The group then brainstormed specific ways in which they could seek those same benefits when they discharge and return home.  Therapeutic Goals 1. Patient will identify and describe positive and negative feelings related to hospitalization 2. Patient will verbalize benefits of hospitalization to themselves personally 3. Patients will brainstorm together ways they can obtain similar benefits in the outpatient setting, identify barriers to wellness and possible solutions  Summary of Patient Progress:  The patient expressed his primary feelings about being hospitalized are "I don't care, I've been here several times."  He was unable to state anything he can do to stay out of the hospital.  His affect was flat, he was lounging low in his chair, had no eye contact, and was disinterested.  Therapeutic Modalities Cognitive Behavioral Therapy Motivational Interviewing    Ambrose Mantle, LCSW 03/06/2018, 8:33 AM

## 2018-03-06 NOTE — Progress Notes (Signed)
D: Pt denies SI/HI/AVH. Pt is pleasant and cooperative. Pt visible on unit . Pt minimal interaction with Clinical research associate but has been appropriate this evening A: Pt was offered support and encouragement. Pt was given scheduled medications. Pt was encourage to attend groups. Q 15 minute checks were done for safety.  R:Pt attends groups and interacts well with peers and staff. Pt is taking medication. Pt has no complaints.Pt receptive to treatment and safety maintained on unit.  Problem: Education: Goal: Emotional status will improve Outcome: Progressing   Problem: Education: Goal: Mental status will improve Outcome: Progressing   Problem: Activity: Goal: Interest or engagement in activities will improve Outcome: Progressing

## 2018-03-06 NOTE — [facility] Group Notes (Signed)
[facility] Group Notes:  (Nursing/MHT/Case Management/Adjunct)  Date:  03/06/2018  Time:  3:01 PM  Type of Therapy:  Nurse Education  Participation Level:  Active  Participation Quality:  Appropriate and Inattentive  Affect:  Appropriate  Cognitive:  Alert  Insight:  Improving  Engagement in Group:  Developing/Improving  Modes of Intervention:  Activity, Discussion and Education  Summary of Progress/Problems: In this group, we discussed sleep hygiene and healthy habits to help improve patients' sleep. The patient participated and offered two ideas for health sleep habits. He was inattentive and drawing pictures during most of group, but did have some participation.  Kirstie Mirza 03/06/2018, 3:01 PM

## 2018-03-06 NOTE — Progress Notes (Signed)
DAR Note: Pt remained childlike. Pt at assessment denied any SI/HI, anxiety, depression, pain or AVH. Pt observed pacing the hallway. Pt was med compliant. All patient's questions and concerns addressed. Support, encouragement, and safe environment provided. 15-minute safety checks continue.

## 2018-03-06 NOTE — Plan of Care (Signed)
D: Patient presents irritable, depressed. He initially refused to take his medications this morning, but later agreed to take them. "I don't want to get up." He refused morning vitals as well. He is noncompliant with his diabetes management, and had cake with his lunch. He refused to fill out a self-inventory and did not reply when asked about his sleep. Patient denies SI/HI/AVH.  A: Patient checked q15 min, and checks reviewed. Reviewed medication changes with patient and educated on side effects. Educated patient on importance of attending group therapy sessions and educated on several coping skills. Encouarged participation in milieu through recreation therapy and attending meals with peers. Support and encouragement provided. Fluids offered. R: Patient receptive to education on medications, and is medication compliant. Patient contracts for safety on the unit.

## 2018-03-07 LAB — GLUCOSE, CAPILLARY
GLUCOSE-CAPILLARY: 199 mg/dL — AB (ref 70–99)
Glucose-Capillary: 190 mg/dL — ABNORMAL HIGH (ref 70–99)
Glucose-Capillary: 290 mg/dL — ABNORMAL HIGH (ref 70–99)
Glucose-Capillary: 251 mg/dL — ABNORMAL HIGH (ref 70–99)

## 2018-03-07 NOTE — [facility] Group Notes (Signed)
Adult Psychoeducational Group Note  Date:  03/07/2018 Time:  8:58 PM  Group Topic/Focus:  Wrap-Up Group:   The focus of this group is to help patients review their daily goal of treatment and discuss progress on daily workbooks.  Participation Level:  Active  Participation Quality:  Appropriate and Attentive  Affect:  Appropriate  Cognitive:  Alert and Appropriate  Insight: Appropriate and Good  Engagement in Group:  Engaged  Modes of Intervention:  Discussion and Education  Additional Comments:  Pt attended and participated in wrap up group this evening. Pt rated their day a 6/10 due to their day starting off good, but getting worse due to them not being able to get in touch with their parole officer. Pt did not complete their goal, which was to talk with their parole officer.   Chrisandra Netters 03/07/2018, 8:58 PM

## 2018-03-07 NOTE — Progress Notes (Signed)
D: Pt denies SI/HI/AVH. Pt has been hanging around the double doors to the unit most of the shift . When pt was informed he could not stand there pt appeared to understand , until writer walked away and pt continued to stand at the double doors. Pt was observed communicating with another pt from another hall, pt was informed that that was disrupting the unit. Pt tried to continue every time Clinical research associate was out of his sight , but pt did not realize he was being watched the whole time. Pt presents on the unit very gamey and childlike , pt tries to be manipulative on the unit. " I can still see through the cracks, They just going to take it down tomorrow".   A: Pt was offered support and encouragement. Pt was given scheduled medications. Pt was encourage to attend groups. Q 15 minute checks were done for safety.  R: Pt is taking medication.  safety maintained on unit.

## 2018-03-07 NOTE — Progress Notes (Addendum)
[facility] MD Progress Note  03/07/2018 9:15 AM [name] xxxVarela [name].  MRN:  [id number]   Syion is awake alert and oriented x3.  Presents flat, guarded and reports depression related to long-term admission.  Patient appears slightly irritable during this assessment.  Asan continues to request to be discharge as he reports he is feeling better overall.  Continues to deny homicidal suicidal ideations.  Rates his depression 3 out of 10 with 10 being the worst.  Reports a good appetite.  States he is resting well.  Reports he is hopeful that the discharge will allow him to be released on Tuesday after court.  Reports taking medications as prescribed and tolerating them well. support, encouragement and reassurance was provided.    History:Boyce Lemberg is an 17 y/o M with history of MDD, personality disorder, ADHD, (and elsewhere schizophrenia), who was admitted from [facility] on [facility] initiated at [facility] after pt was at his outpatient follow up appointment and verbalized suicidal ideation with plan and worsening depression. Pt was medically cleared at [facility] and then transferred to [facility] for additional treatment and stabilization. He was restarted on previous medication of thorazine, and then he was also started on trial of lexapro. Pt has been reporting incremental improvement of his presenting symptoms. He was changed from lexapro to prozac after he restarted refusing lexapro.     Principal Problem: MDD (major depressive disorder), recurrent, severe, with psychosis (HCC) Diagnosis:   Patient Active Problem List   Diagnosis Date Noted  . Borderline personality disorder (HCC) [F60.3] 02/26/2018  . MDD (major depressive disorder), recurrent, severe, with psychosis (HCC) [F33.3] 02/25/2018  . Major depressive disorder, recurrent severe without psychotic features (HCC) [F33.2] 07/24/2017  . DKA, type 1 (HCC) [E10.10] 07/16/2017  . Nausea & vomiting [R11.2] 07/16/2017  . Uncontrolled type 1 diabetes circulatory  disorder erectile dysfunction (HCC) [E10.59, N52.1, E10.65]   . DKA (diabetic ketoacidoses) (HCC) [E13.10] 12/18/2016  . History of seizures [Z87.898] 12/01/2016  . History of migraine [Z86.69] 12/01/2016  . MDD (major depressive disorder), recurrent severe, without psychosis (HCC) [F33.2] 03/11/2015  . Disordered eating [F50.9] 03/08/2015  . Ketonuria [R82.4]   . Adjustment reaction to medical therapy [F43.20]   . Non compliance w medication regimen [Z91.14]   . 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 Dehydration [E86.0] 08/22/2014  . Hyperglycemia due to type 1 diabetes mellitus (HCC) [E10.65] 08/21/2014  . Type 1 diabetes mellitus with hyperglycemia (HCC) [E10.65]   . Noncompliance [Z91.19]   . Generalized abdominal pain [R10.84]   . Glycosuria [R81]   . Depression [F32.9] 07/12/2014  . Involuntary movements [R25.9] 06/13/2014  . Bilateral leg pain [M79.604, M79.605] 06/13/2014  . 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 Suicidal ideation [R45.851] 03/31/2014  . ODD (oppositional defiant disorder) [F91.3] 03/03/2014  . MDD (major depressive disorder), recurrent episode, moderate (HCC) [F33.1] 02/16/2014  . 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 Microcytic anemia [D50.9] 02/15/2014  . Hyperglycemia [R73.9] 02/14/2014  . Goiter [E04.9] 02/04/2014  . 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 Acquired acanthosis nigricans [L83] 02/04/2014  . Obesity, morbid (HCC) [E66.01] 02/04/2014  . Insulin resistance [E88.81] 02/04/2014  . 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 Hypoglycemia associated with diabetes (HCC) [E11.649] 02/02/2014  . Maladaptive health behaviors affecting medical condition [F54] 02/02/2014  . Hypoglycemia [E16.2] 02/02/2014  . Type 1  diabetes mellitus  in patient 34 to 19 years of age with hemoglobin A1c goal of less than 7.5% (HCC) [E10.9] 01/26/2014  . 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 Migraine without aura [G43.009] 12/13/2013  . Body mass index, pediatric, greater than or equal to 95th percentile for age [facility] 12/13/2013  . Asthma [J45.909] 12/13/2013  . Hypoglycemia unawareness in type 1 diabetes mellitus (HCC) [E10.649] 08/08/2013  . Seizure disorder (HCC) [G40.909] 07/06/2013  . Short-term memory loss [R41.3] 07/06/2013  . Sickle cell trait (HCC) [D57.3] 07/06/2013  . Diabetes (HCC) [E11.9] 06/17/2013  . Diabetes mellitus, new onset (HCC) [E11.9] 06/17/2013   Total Time spent with patient: 30 minutes  Past Psychiatric History: see H&P  Past Medical History:  Past Medical History:  Diagnosis Date  . ADD (attention deficit disorder)   . Allergy   . Anxiety   . Asthma   . Depression   . Epileptic seizure (HCC)    "both petit and grand mal; last sz ~ 2 wk ago" (06/17/2013)  . Headache(784.0)    "2-3 times/wk usually" (06/17/2013)  . Heart murmur    "heard a slight one earlier today" (06/17/2013)  . Migraine    "maybe once/month; it's severe" (06/17/2013)  . ODD (oppositional defiant disorder)   . Sickle cell trait (HCC)   . Type I diabetes mellitus (HCC)    "that's what they're thinking now" (06/17/2013)  . Vision abnormalities    "takes him longer to focus cause; from his sz" (06/17/2013)    Past Surgical History:  Procedure Laterality Date  . CIRCUMCISION  2000  . FINGER SURGERY Left 2001   "crushed pinky; had to repair it" (06/17/2013)   Family History:  Family History  Problem Relation Age of Onset  . Asthma Mother   . COPD Mother   . Other Mother        possible autoimmune, unclear  . Diabetes Maternal Grandmother   . Heart disease Maternal Grandmother   . Hypertension  Maternal Grandmother   . Mental illness Maternal Grandmother   . Heart disease Maternal Grandfather   . Hyperlipidemia Paternal Grandmother   . Hyperlipidemia Paternal Grandfather   . Migraines Sister        Hemiplegic Migraines    Family Psychiatric  History: see H&P Social History:  Social History   Substance and Sexual Activity  Alcohol Use No  . Alcohol/week: 0.0 standard drinks     Social History   Substance and Sexual Activity  Drug Use No    Social History   Socioeconomic History  . Marital status: Single    Spouse name: Not on file  . Number of children: Not on file  . Years of education: Not on file  . Highest education level: Not on file  Occupational History  . Not on file  Social Needs  . Financial resource strain: Not on file  . Food insecurity:    Worry: Not on file    Inability: Not on file  . Transportation needs:    Medical: Not on file    Non-medical: Not on file  Tobacco Use  . Smoking status: Passive Smoke Exposure - Never Smoker  . Smokeless tobacco: Never Used  . Tobacco comment: Mom and dad smoke outside   Substance and Sexual Activity  . Alcohol use: No    Alcohol/week: 0.0 standard drinks  . Drug use: No  . Sexual activity: Never  Lifestyle  .  Physical activity:    Days per week: Not on file    Minutes per session: Not on file  . Stress: Not on file  Relationships  . Social connections:    Talks on phone: Not on file    Gets together: Not on file    Attends religious service: Not on file    Active member of club or organization: Not on file    Attends meetings of clubs or organizations: Not on file    Relationship status: Not on file  Other Topics Concern  . Not on file  Social History Narrative   Lives at home with father, stepmother, 93 year old brother.   Additional Social History:                         Sleep: Good  Appetite:  Good  Current Medications: Current Facility-Administered Medications   Medication Dose Route Frequency Provider Last Rate Last Dose  . acetaminophen (TYLENOL) tablet 650 mg  650 mg Oral Q6H PRN Money, Gerlene Burdock, FNP   650 mg at 03/05/18 1611  . benztropine (COGENTIN) tablet 1 mg  1 mg Oral BID PRN Micheal Likens, MD       Or  . benztropine mesylate (COGENTIN) injection 1 mg  1 mg Intramuscular BID PRN Micheal Likens, MD   1 mg at 03/02/18 0741  . chlorproMAZINE (THORAZINE) tablet 50 mg  50 mg Oral Q6H PRN Micheal Likens, MD   50 mg at 03/01/18 1403   Or  . chlorproMAZINE (THORAZINE) injection 50 mg  50 mg Intramuscular Q6H PRN Micheal Likens, MD   50 mg at 03/02/18 0741  . chlorproMAZINE (THORAZINE) tablet 100 mg  100 mg Oral QHS Micheal Likens, MD   100 mg at 03/06/18 2137  . FLUoxetine (PROZAC) capsule 20 mg  20 mg Oral Daily Micheal Likens, MD   20 mg at 03/06/18 1020  . hydrOXYzine (ATARAX/VISTARIL) tablet 50 mg  50 mg Oral Q6H PRN Micheal Likens, MD   50 mg at 03/06/18 2137  . insulin aspart (novoLOG) injection 0-15 Units  0-15 Units Subcutaneous TID WC Rankin, [name], NP   3 Units at 03/07/18 0639  . insulin aspart (novoLOG) injection 8 Units  8 Units Subcutaneous TID WC Rankin, [name], NP   8 Units at 03/07/18 0640  . insulin glargine (LANTUS) injection 35 Units  35 Units Subcutaneous BID Antonieta Pert, MD   35 Units at 03/06/18 1713  . LORazepam (ATIVAN) tablet 1 mg  1 mg Oral Q6H PRN Micheal Likens, MD   1 mg at 03/01/18 1403   Or  . LORazepam (ATIVAN) injection 1 mg  1 mg Intramuscular Q6H PRN Micheal Likens, MD      . nicotine (NICODERM CQ - dosed in mg/24 hours) patch 21 mg  21 mg Transdermal Daily Micheal Likens, MD   21 mg at 03/03/18 0810  . traZODone (DESYREL) tablet 100 mg  100 mg Oral QHS PRN,MR X 1 [place], Burlene Arnt, MD   100 mg at 03/06/18 2138    Lab Results:  Results for orders placed or performed during the hospital encounter  of 02/25/18 (from the past 48 hour(s))  Glucose, capillary     Status: Abnormal   Collection Time: 03/05/18 11:56 AM  Result Value Ref Range   Glucose-Capillary 311 (H) 70 - 99 mg/dL   Comment 1 QC Due  Glucose, capillary     Status: Abnormal   Collection Time: 03/05/18  5:01 PM  Result Value Ref Range   Glucose-Capillary 382 (H) 70 - 99 mg/dL  Glucose, capillary     Status: Abnormal   Collection Time: 03/05/18  8:41 PM  Result Value Ref Range   Glucose-Capillary 269 (H) 70 - 99 mg/dL   Comment 1 Notify RN   Glucose, capillary     Status: Abnormal   Collection Time: 03/06/18  6:19 AM  Result Value Ref Range   Glucose-Capillary 223 (H) 70 - 99 mg/dL  Glucose, capillary     Status: Abnormal   Collection Time: 03/06/18 12:09 PM  Result Value Ref Range   Glucose-Capillary 237 (H) 70 - 99 mg/dL  Glucose, capillary     Status: Abnormal   Collection Time: 03/06/18  4:53 PM  Result Value Ref Range   Glucose-Capillary 362 (H) 70 - 99 mg/dL  Glucose, capillary     Status: Abnormal   Collection Time: 03/06/18  8:36 PM  Result Value Ref Range   Glucose-Capillary 153 (H) 70 - 99 mg/dL   Comment 1 Notify RN   Glucose, capillary     Status: Abnormal   Collection Time: 03/07/18  5:52 AM  Result Value Ref Range   Glucose-Capillary 190 (H) 70 - 99 mg/dL   Comment 1 Notify RN     Blood Alcohol level:  Lab Results  Component Value Date   ETH <10 02/23/2018   ETH <10 08/14/2017    Metabolic Disorder Labs: Lab Results  Component Value Date   HGBA1C 10.1 (H) 01/26/2017   MPG 243 01/26/2017   MPG 263 12/18/2016   No results found for: PROLACTIN Lab Results  Component Value Date   TRIG 244 (H) 12/18/2016    Physical Findings: AIMS: Facial and Oral Movements Muscles of Facial Expression: None, normal Lips and Perioral Area: None, normal Jaw: None, normal Tongue: None, normal,Extremity Movements Upper (arms, wrists, hands, fingers): None, normal Lower (legs, knees, ankles,  toes): None, normal, Trunk Movements Neck, shoulders, hips: None, normal, Overall Severity Severity of abnormal movements (highest score from questions above): None, normal Incapacitation due to abnormal movements: None, normal Patient's awareness of abnormal movements (rate only patient's report): No Awareness, Dental Status Current problems with teeth and/or dentures?: No Does patient usually wear dentures?: No  CIWA:  CIWA-Ar Total: 0 COWS:     Musculoskeletal: Strength & Muscle Tone: within normal limits Gait & Station: normal Patient leans: N/A  Psychiatric Specialty Exam: Physical Exam  Nursing note and vitals reviewed. Constitutional: He is oriented to person, place, and time. He appears well-developed.  Neurological: He is alert and oriented to person, place, and time.  Psychiatric: He has a normal mood and affect. His behavior is normal.    Review of Systems  Psychiatric/Behavioral: Positive for depression. Negative for hallucinations and suicidal ideas. The patient is not nervous/anxious and does not have insomnia.   All other systems reviewed and are negative.   Blood pressure 129/82, pulse 89, temperature 99 F (37.2 C), temperature source Oral, resp. rate 18, height 6\' 3"  (1.905 m), weight 89.8 kg, SpO2 100 %.Body mass index is 24.75 kg/m.  General Appearance: Casual and Fairly Groomed  Eye Contact:  Poor  Speech:  Clear and Coherent and Normal Rate  Volume:  Decreased  Mood:  Depressed and Irritable  Affect:  Congruent, Constricted, Depressed and Flat  Thought Process:  Coherent and Goal Directed  Orientation:  Full (Time,  Place, and Person)  Thought Content:  Logical  Suicidal Thoughts:  No  Homicidal Thoughts:  No  Memory:  Immediate;   Fair Recent;   Fair Remote;   Fair  Judgement:  Poor  Insight:  Lacking  Psychomotor Activity:  Normal  Concentration:  Concentration: Fair  Recall:  [employer] of Knowledge:  Fair  Language:  Fair  Akathisia:  No   Handed:    AIMS (if indicated):     Assets:  Housing Resilience Social Support  ADL's:  Intact  Cognition:  WNL  Sleep:  Number of Hours: 6.5   Treatment Plan Summary: Daily contact with patient to assess and evaluate symptoms and progress in treatment and Medication management   Will continue treatment plan as listed below on  03/07/2018 except where it is noted.  Mood control/depression: -Continue Thorazine 100mg  po qhs -Continue prozac 20mg  po qday  Insomnia -Continue with Trazodone 100 mg po qhs prn insomnia (may repeat x1 prn ongoing insomnia)  Agitation     -Continue thorazine 50mg  po/IM q6h prn agitation   -Continue ativan 1mg  po/IM q6h prn agitation  EPS  -Continue cogentin 1mg  po/IM q12h prn EPS  Anxiety    -Continue vistaril 50mg  po q6h prn anxiety  Dm:  -Continue sliding scale novolog -Continue Lantus 35 units BID -Encouraged carb counting as was suggested by patient   Will continue to monitor vitals, medication compliance and treatment side effects while patient is here.   CSW will continue working on dispositionand patient to consider group home placement.  Patient to participate in therapeutic milieu   Oneta Rack, NP 03/07/2018, 9:15 AM ..Agree with NP Progress Note

## 2018-03-07 NOTE — Plan of Care (Signed)
D: Patient presents depressed, irritable, non-compliant. He is guarded, and refusing vitals. He refuses to get up for his medications in the morning until 11 am. He refuses to fill out self-inventory. He appears apathetic and depressed. Patient denies SI/HI/AVH.  A: Patient checked q15 min, and checks reviewed. Reviewed medication changes with patient and educated on side effects. Educated patient on importance of attending group therapy sessions and educated on several coping skills. Encouarged participation in milieu through recreation therapy and attending meals with peers. Support and encouragement provided. Fluids offered. R: Patient receptive to education on medications, and is medication compliant. Patient contracts for safety on the unit.

## 2018-03-07 NOTE — [facility] Group Notes (Signed)
[facility] LCSW Group Therapy Note  Date/Time:  03/07/2018  11:00AM-12:00PM  Type of Therapy and Topic:  Group Therapy:  Music and Mood  Participation Level:  Minimal   Description of Group: In this process group, members listened to a variety of genres of music and identified that different types of music evoke different responses.  Patients were encouraged to identify music that was soothing for them and music that was energizing for them.  Patients discussed how this knowledge can help with wellness and recovery in various ways including managing depression and anxiety as well as encouraging healthy sleep habits.    Therapeutic Goals: 1. Patients will explore the impact of different varieties of music on mood 2. Patients will verbalize the thoughts they have when listening to different types of music 3. Patients will identify music that is soothing to them as well as music that is energizing to them 4. Patients will discuss how to use this knowledge to assist in maintaining wellness and recovery 5. Patients will explore the use of music as a coping skill  Summary of Patient Progress:  At the beginning of group, patient expressed that he felt tired, rating that a 7 out of 10.  He did not respond to any of the music other than when a Latino song was played, and he immediately demanded that it be turned off, saying "I hate that kind of music."   At the end of group, although he had not slept any or even closed his eyes during group, but had remained alert, he stated his fatigue was even worse at that point, now a 10 out of 10.  Therapeutic Modalities: Solution Focused Brief Therapy Activity   Ambrose Mantle, LCSW

## 2018-03-08 LAB — GLUCOSE, CAPILLARY
GLUCOSE-CAPILLARY: 279 mg/dL — AB (ref 70–99)
GLUCOSE-CAPILLARY: 290 mg/dL — AB (ref 70–99)
Glucose-Capillary: 372 mg/dL — ABNORMAL HIGH (ref 70–99)
Glucose-Capillary: 226 mg/dL — ABNORMAL HIGH (ref 70–99)

## 2018-03-08 NOTE — Progress Notes (Signed)
Adult Psychoeducational Group Note  Date:  03/08/2018 Time:  8:36 PM  Group Topic/Focus:  Wrap-Up Group:   The focus of this group is to help patients review their daily goal of treatment and discuss progress on daily workbooks.  Participation Level:  Active  Participation Quality:  Appropriate  Affect:  Appropriate  Cognitive:  Appropriate  Insight: Appropriate  Engagement in Group:  Engaged  Modes of Intervention:  Discussion  Additional Comments: The patient expressed that he attended groups.The patient also said that he rates today a 7.  Octavio Manns 03/08/2018, 8:36 PM

## 2018-03-08 NOTE — Progress Notes (Signed)
D: Pt denies SI/HI/AVH. Pt is pleasant and cooperative. Pt still needs constant re-direction, but has been calmer on the unit . Pt visible this evening in dayroom still appears child like in his interaction.  A: Pt was offered support and encouragement. Pt was given scheduled medications. Pt was encourage to attend groups. Q 15 minute checks were done for safety.  R:Pt attends groups and interacts well with peers and staff. Pt is taking medication. Pt receptive to treatment and safety maintained on unit.   Problem: Education: Goal: Mental status will improve Outcome: Progressing   Problem: Activity: Goal: Interest or engagement in activities will improve Outcome: Progressing   Problem: Activity: Goal: Sleeping patterns will improve Outcome: Progressing

## 2018-03-08 NOTE — Tx Team (Signed)
Interdisciplinary Treatment and Diagnostic Plan Update  03/08/2018 Time of Session: 4:01 PM  [name] [name] xxxVarela [name]. MRN: [id number]  Principal Diagnosis: MDD (major depressive disorder), recurrent, severe, with psychosis ([place])  Secondary Diagnoses: Principal Problem:   MDD (major depressive disorder), recurrent, severe, with psychosis ([place]) Active Problems:   Borderline personality disorder ([place])   Current Medications:  Current Facility-Administered Medications  Medication Dose Route Frequency Provider Last Rate Last Dose  . acetaminophen (TYLENOL) tablet 650 mg  650 mg Oral Q6H PRN Money, [name], FNP   650 mg at 03/05/18 1611  . benztropine (COGENTIN) tablet 1 mg  1 mg Oral BID PRN [name], MD       Or  . benztropine mesylate (COGENTIN) injection 1 mg  1 mg Intramuscular BID PRN [name], MD   1 mg at 03/02/18 0741  . chlorproMAZINE (THORAZINE) tablet 50 mg  50 mg Oral Q6H PRN [name], MD   50 mg at 03/01/18 1403   Or  . chlorproMAZINE (THORAZINE) injection 50 mg  50 mg Intramuscular Q6H PRN [name], MD   50 mg at 03/02/18 0741  . chlorproMAZINE (THORAZINE) tablet 100 mg  100 mg Oral QHS [name], MD   100 mg at 03/07/18 2114  . FLUoxetine (PROZAC) capsule 20 mg  20 mg Oral Daily [name], MD   20 mg at 03/08/18 0952  . hydrOXYzine (ATARAX/VISTARIL) tablet 50 mg  50 mg Oral Q6H PRN [name], MD   50 mg at 03/07/18 2114  . insulin aspart (novoLOG) injection 0-15 Units  0-15 Units Subcutaneous TID WC Rankin, [name], NP   15 Units at 03/08/18 1515  . insulin aspart (novoLOG) injection 8 Units  8 Units Subcutaneous TID WC Rankin, [name], NP   8 Units at 03/08/18 1518  . insulin glargine (LANTUS) injection 35 Units  35 Units Subcutaneous BID [name], MD   35 Units at 03/08/18 [phone]  . LORazepam (ATIVAN) tablet 1 mg  1 mg Oral Q6H PRN [name], MD    1 mg at 03/01/18 1403   Or  . LORazepam (ATIVAN) injection 1 mg  1 mg Intramuscular Q6H PRN [name], MD      . nicotine (NICODERM CQ - dosed in mg/24 hours) patch 21 mg  21 mg Transdermal Daily [name], MD   21 mg at 03/03/18 0810  . traZODone (DESYREL) tablet 100 mg  100 mg Oral QHS PRN,MR X 1 [name], MD   100 mg at 03/07/18 2114    PTA Medications: Medications Prior to Admission  Medication Sig Dispense Refill Last Dose  . albuterol (PROVENTIL HFA;VENTOLIN HFA) 108 (90 Base) MCG/ACT inhaler Inhale 2 puffs into the lungs every 6 (six) hours as needed for wheezing or shortness of breath.   02/23/2018 at Unknown time  . cetirizine (ZYRTEC) 10 MG tablet Take 10 mg by mouth at bedtime as needed for allergies.   02/23/2018 at Unknown time  . chlorproMAZINE (THORAZINE) 50 MG tablet Take 1 tablet (50 mg total) by mouth 2 (two) times daily. For mood control (Patient not taking: Reported on 08/15/2017) 60 tablet 0 Not Taking at Unknown time  . EPINEPHrine 0.3 mg/0.3 mL IJ SOAJ injection Inject 0.3 mg into the muscle as needed (PRN for anaphylaxis).   unknown  . escitalopram (LEXAPRO) 20 MG tablet Take 1 tablet (20 mg total) by mouth daily. For depression (Patient not  taking: Reported on 08/15/2017) 30 tablet 0 Not Taking at Unknown time  . gabapentin (NEURONTIN) 300 MG capsule Take 1 capsule (300 mg total) by mouth 3 (three) times daily. For agitation (Patient not taking: Reported on 08/15/2017) 90 capsule 0 Not Taking at Unknown time  . glucagon (GLUCAGON EMERGENCY) 1 MG injection Inject 1 mg into the muscle once as needed (severe hypoglycemia - if unresponsive, unable to swallow, unconscious and/or has seizure). (Patient not taking: Reported on 08/15/2017) 1 each 12 Not Taking at Unknown time  . glucose blood (ACCU-CHEK GUIDE) test strip Test 8 times daily: For blood sugar checks or monitoring 200 each 5 Not Taking at Unknown time  . glucose blood (ACCU-CHEK  GUIDE) test strip Check glucose 6x daily: For blood sugar checks 200 each 0 Not Taking at Unknown time  . hydrOXYzine (ATARAX/VISTARIL) 25 MG tablet Take 1 tablet (25 mg total) by mouth every 6 (six) hours as needed for anxiety. (Patient not taking: Reported on 08/15/2017) 60 tablet 0 Not Taking at Unknown time  . insulin aspart (NOVOLOG FLEXPEN) 100 UNIT/ML FlexPen Inject per protocol up to 50 units per day: For diabetes management (Patient taking differently: Inject 0-15 Units into the skin 3 (three) times daily with meals. Sliding scale) 15 mL 5 02/23/2018 at Unknown time  . insulin glargine (LANTUS) 100 UNIT/ML injection Inject 0.3 mLs (30 Units total) into the skin 2 (two) times daily. For diabetes management (Patient taking differently: Inject 35 Units into the skin 2 (two) times daily. For diabetes management) 10 mL 0 02/23/2018 at Unknown time  . nicotine (NICODERM CQ - DOSED IN MG/24 HOURS) 21 mg/24hr patch Place 1 patch (21 mg total) onto the skin daily. For smoking cessation (Patient not taking: Reported on 08/15/2017) 28 patch 0 Not Taking at Unknown time  . SUMAtriptan (IMITREX) 25 MG tablet Take 25 mg by mouth daily as needed for migraine. May repeat in 2 hours if headache persists or recurs.   Past Month at Unknown time  . traZODone (DESYREL) 100 MG tablet Take 1 tablet (100 mg) by mouth at bedtime: For sleep (Patient not taking: Reported on 08/15/2017) 30 tablet 0 Not Taking at Unknown time    Patient Stressors: Financial difficulties Legal issue Occupational concerns  Patient Strengths: Ability for insight Capable of independent living Communication skills Physical Health Supportive family/friends  Treatment Modalities: Medication Management, Group therapy, Case management,  1 to 1 session with clinician, Psychoeducation, Recreational therapy.   Physician Treatment Plan for Primary Diagnosis: MDD (major depressive disorder), recurrent, severe, with psychosis ([place])   Long Term  Goal(s): Improvement in symptoms so as ready for discharge  Short Term Goals: Ability to demonstrate self-control will improve Ability to identify and develop effective coping behaviors will improve Ability to maintain clinical measurements within normal limits will improve  Medication Management: Evaluate patient's response, side effects, and tolerance of medication regimen.  Therapeutic Interventions: 1 to 1 sessions, Unit Group sessions and Medication administration.  Evaluation of Outcomes: Progressing   9/4:E[place]on reports, "I'm feeling really tired.They forced me to take the medicines that I don't want to take because it makes me real tired. My mood is terrible because no one gives a shit about me. This is making me upset everyday. [facility] there. They have groups & I have other people that I can talk to that cared about me in the past. I feel claustrophobic here".  Mood  control/depression: -Continue Thorazine 50 mgBID -Continue Lexapro25m po Q day  Insomnia Continue with Trazodone 100 mg  Dm:  Continue sliding scale novolog Continue Lantus 35 units BID Encouraged carb counting as was suggested by patient   Physician Treatment Plan for Secondary Diagnosis: Principal Problem:   MDD (major depressive disorder), recurrent, severe, with psychosis ([place] Active Problems:   Borderline personality disorder ([place]   Long Term Goal(s): Improvement in symptoms so as ready for discharge  Short Term Goals: Ability to demonstrate self-control will improve Ability to identify and develop effective coping behaviors will improve Ability to maintain clinical measurements within normal limits will improve  Medication Management: Evaluate patient's response, side effects, and tolerance of medication regimen.  Therapeutic Interventions: 1 to 1  sessions, Unit Group sessions and Medication administration.  Evaluation of Outcomes: Progressing   RN Treatment Plan for Primary Diagnosis: MDD (major depressive disorder), recurrent, severe, with psychosis ([place] Long Term Goal(s): Knowledge of disease and therapeutic regimen to maintain health will improve  Short Term Goals: Ability to identify and develop effective coping behaviors will improve and Compliance with prescribed medications will improve  Medication Management: RN will administer medications as ordered by provider, will assess and evaluate patient's response and provide education to patient for prescribed medication. RN will report any adverse and/or side effects to prescribing provider.  Therapeutic Interventions: 1 on 1 counseling sessions, Psychoeducation, Medication administration, Evaluate responses to treatment, Monitor vital signs and CBGs as ordered, Perform/monitor CIWA, COWS, AIMS and Fall Risk screenings as ordered, Perform wound care treatments as ordered.  Evaluation of Outcomes: Progressing   LCSW Treatment Plan for Primary Diagnosis: MDD (major depressive disorder), recurrent, severe, with psychosis ([place] Long Term Goal(s): Safe transition to appropriate next level of care at discharge, Engage patient in therapeutic group addressing interpersonal concerns.  Short Term Goals: Engage patient in aftercare planning with referrals and resources  Therapeutic Interventions: Assess for all discharge needs, 1 to 1 time with Social worker, Explore available resources and support systems, Assess for adequacy in community support network, Educate family and significant other(s) on suicide prevention, Complete Psychosocial Assessment, Interpersonal group therapy.  Evaluation of Outcomes: Met  Return home, follow up outp. 9/4: Pt states he is not welcome to return home.  [place]. Clinic  for follow up to be determined.   Progress in Treatment: Attending groups: No Participating in groups: No Taking medication as prescribed: Yes Toleration medication: Yes, no side effects reported at this time Family/Significant other contact made: No Patient understands diagnosis: No Limited insight Discussing patient identified problems/goals with staff: Yes Medical problems stabilized or resolved: Yes Denies suicidal/homicidal ideation: Yes Issues/concerns per patient self-inventory: None Other: N/A  New problem(s) identified: None identified at this time.   New Short Term/Long Term Goal(s): "I have no goals."   Discharge Plan or Barriers: CSW will continue to assess for appropriate referrals and discharge plans.   Reason for Continuation of Hospitalization:  Depression Medication stabilization Suicidal ideation   Estimated Length of Stay: 03/10/18  Attendees: Patient:  03/08/2018  4:01 PM  Physician: CMaris Berger MD 03/08/2018  4:01 PM  Nursing: RElesa MassedRN; ONicoletta DressWViona Gilmore RN 03/08/2018  4:01 PM  RN Care Manager: JLars Pinks RN 03/08/2018  4:01 PM  Social Worker: RRipley Fraise JBelt [place]9/02/2018  4:01 PM  Recreational Therapist: MWinfield Cunas9/02/2018  4:01 PM  Other: DNorberto Sorenson9/02/2018  4:01 PM  Other:  03/08/2018  4:01 PM    Scribe for Treatment Team:  Roque Lias LCSW 03/08/2018 4:01 PM
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
## 2018-03-08 NOTE — Progress Notes (Signed)
Recreation Therapy Notes   Date: 9.9.19 Time: 1000 Location: 500 [address] Dayroom  Group Topic: Goal Setting  Goal Area(s) Addresses:  Patient will successfully set at least 3 goals for their future during group.  Patient will successfully identify benefit of setting goals.    Behavioral Response: Engaged  Intervention: Worksheet, pencils  Activity: Goal Setting.  Patients were given worksheets that were broken into 6 categories (family, friends, work/school, body, mental health and spirituality).  Patients were to identify what they are doing well, what they want to improve and set a goal of how to make that improvement.  Patients will then share their top 3 goals with the group.    Education Outcome:  Acknowledges education In group clarification offered Needs additional education  Clinical Observations/Feedback: Pt identified for work he wants to improve his employment skills and set a goal of finding a job in his interests; body- he does well at boxing, needs to improve training/practice and set a goal of keep going to training 5 times a week; and mental health- does well at taking medication, needs to improve taking medications outside of the hospital and set a goal of "take medications as prescribed daily".    Caroll Rancher, LRT/CTRS         Lillia Abed, Lindsi Bayliss A 03/08/2018 1:21 PM
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
## 2018-03-09 LAB — GLUCOSE, CAPILLARY
Glucose-Capillary: 255 mg/dL — ABNORMAL HIGH (ref 70–99)
Glucose-Capillary: 251 mg/dL — ABNORMAL HIGH (ref 70–99)

## 2018-03-09 MED ORDER — ALBUTEROL SULFATE HFA 108 (90 BASE) MCG/ACT IN AERS: 2.0000 | INHALATION_SPRAY | Freq: Four times a day (QID) | RESPIRATORY_TRACT | Status: DC | PRN

## 2018-03-09 MED ORDER — GLUCAGON (RDNA) 1 MG IJ KIT: 1.0000 mg | PACK | Freq: Once | INTRAMUSCULAR | 12 refills | Status: DC | PRN

## 2018-03-09 MED ORDER — CHLORPROMAZINE HCL 100 MG PO TABS: 100.0000 mg | ORAL_TABLET | Freq: Every day | ORAL | 0 refills | Status: DC

## 2018-03-09 MED ORDER — INSULIN ASPART 100 UNIT/ML FLEXPEN: 0.0000 [IU] | PEN_INJECTOR | Freq: Three times a day (TID) | SUBCUTANEOUS | Status: DC

## 2018-03-09 MED ORDER — TRAZODONE HCL 100 MG PO TABS: 100.0000 mg | ORAL_TABLET | Freq: Every evening | ORAL | 0 refills | Status: DC | PRN

## 2018-03-09 MED ORDER — GLUCOSE BLOOD VI STRP: ORAL_STRIP | 0 refills | Status: DC

## 2018-03-09 MED ORDER — NICOTINE 21 MG/24HR TD PT24: 21.0000 mg | MEDICATED_PATCH | Freq: Every day | TRANSDERMAL | 0 refills | Status: DC

## 2018-03-09 MED ORDER — GABAPENTIN 300 MG PO CAPS: 300.0000 mg | ORAL_CAPSULE | Freq: Three times a day (TID) | ORAL | 0 refills | Status: DC

## 2018-03-09 MED ORDER — TRAZODONE HCL 100 MG PO TABS
100.0000 mg | ORAL_TABLET | Freq: Every evening | ORAL | Status: DC | PRN
  Filled 2018-03-09: qty 7

## 2018-03-09 MED ORDER — HYDROXYZINE HCL 50 MG PO TABS: 50.0000 mg | ORAL_TABLET | Freq: Four times a day (QID) | ORAL | 0 refills | Status: DC | PRN

## 2018-03-09 MED ORDER — EPINEPHRINE 0.3 MG/0.3ML IJ SOAJ: 0.3000 mg | INTRAMUSCULAR | Status: DC | PRN

## 2018-03-09 MED ORDER — INSULIN GLARGINE 100 UNIT/ML ~~LOC~~ SOLN: 35.0000 [IU] | Freq: Two times a day (BID) | SUBCUTANEOUS | 0 refills | Status: DC

## 2018-03-09 MED ORDER — FLUOXETINE HCL 20 MG PO CAPS: 20.0000 mg | ORAL_CAPSULE | Freq: Every day | ORAL | 0 refills | Status: DC

## 2018-03-09 NOTE — [facility] Group Notes (Signed)
LCSW Group Therapy Note   03/09/2018 1:15pm   Type of Therapy and Topic:  Group Therapy:  Overcoming Obstacles   Participation Level:  Did Not Attend   Description of Group:    In this group patients will be encouraged to explore what they see as obstacles to their own wellness and recovery. They will be guided to discuss their thoughts, feelings, and behaviors related to these obstacles. The group will process together ways to cope with barriers, with attention given to specific choices patients can make. Each patient will be challenged to identify changes they are motivated to make in order to overcome their obstacles. This group will be process-oriented, with patients participating in exploration of their own experiences as well as giving and receiving support and challenge from other group members.   Therapeutic Goals: 1. Patient will identify personal and current obstacles as they relate to admission. 2. Patient will identify barriers that currently interfere with their wellness or overcoming obstacles.  3. Patient will identify feelings, thought process and behaviors related to these barriers. 4. Patient will identify two changes they are willing to make to overcome these obstacles:      Summary of Patient Progress      Therapeutic Modalities:   Cognitive Behavioral Therapy Solution Focused Therapy Motivational Interviewing Relapse Prevention Therapy  Ida Rogue, LCSW 03/09/2018 1:45 PM

## 2018-03-09 NOTE — [facility] Suicide Risk Assessment (Signed)
[facility] Discharge Suicide Risk Assessment   Principal Problem: MDD (major depressive disorder), recurrent, severe, with psychosis (HCC) Discharge Diagnoses:  Patient Active Problem List   Diagnosis Date Noted  . Borderline personality disorder (HCC) [F60.3] 02/26/2018  . MDD (major depressive disorder), recurrent, severe, with psychosis (HCC) [F33.3] 02/25/2018  . Major depressive disorder, recurrent severe without psychotic features (HCC) [F33.2] 07/24/2017  . DKA, type 1 (HCC) [E10.10] 07/16/2017  . Nausea & vomiting [R11.2] 07/16/2017  . Uncontrolled type 1 diabetes circulatory disorder erectile dysfunction (HCC) [E10.59, N52.1, E10.65]   . DKA (diabetic ketoacidoses) (HCC) [E13.10] 12/18/2016  . History of seizures [Z87.898] 12/01/2016  . History of migraine [Z86.69] 12/01/2016  . MDD (major depressive disorder), recurrent severe, without psychosis (HCC) [F33.2] 03/11/2015  . Disordered eating [F50.9] 03/08/2015  . Ketonuria [R82.4]   . Adjustment reaction to medical therapy [F43.20]   . Non compliance w medication regimen [Z91.14]   . 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 Dehydration [E86.0] 08/22/2014  . Hyperglycemia due to type 1 diabetes mellitus (HCC) [E10.65] 08/21/2014  . Type 1 diabetes mellitus with hyperglycemia (HCC) [E10.65]   . Noncompliance [Z91.19]   . Generalized abdominal pain [R10.84]   . Glycosuria [R81]   . Depression [F32.9] 07/12/2014  . Involuntary movements [R25.9] 06/13/2014  . Bilateral leg pain [M79.604, M79.605] 06/13/2014  . 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 Suicidal ideation [R45.851] 03/31/2014  . ODD (oppositional defiant disorder) [F91.3] 03/03/2014  . MDD (major depressive disorder), recurrent episode, moderate (HCC) [F33.1] 02/16/2014  . Attention deficit hyperactivity disorder (ADHD), combined type,  moderate [F90.2] 02/16/2014  . Microcytic anemia [D50.9] 02/15/2014  . Hyperglycemia [R73.9] 02/14/2014  . Goiter [E04.9] 02/04/2014  . 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 Acquired acanthosis nigricans [L83] 02/04/2014  . Obesity, morbid (HCC) [E66.01] 02/04/2014  . Insulin resistance [E88.81] 02/04/2014  . 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 Hypoglycemia associated with diabetes (HCC) [E11.649] 02/02/2014  . Maladaptive health behaviors affecting medical condition [F54] 02/02/2014  . Hypoglycemia [E16.2] 02/02/2014  . Type 1 diabetes mellitus in patient 48 to 19 years of age with hemoglobin A1c goal of less than 7.5% (HCC) [E10.9] 01/26/2014  . 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 Migraine without aura [G43.009] 12/13/2013  . Body mass index, pediatric, greater than or equal to 95th percentile for age [facility] 12/13/2013  . Asthma [J45.909] 12/13/2013  . Hypoglycemia unawareness in type 1 diabetes mellitus (HCC) [E10.649] 08/08/2013  . Seizure disorder (HCC) [G40.909] 07/06/2013  . Short-term memory loss [R41.3] 07/06/2013  . Sickle cell trait (HCC) [D57.3] 07/06/2013  . Diabetes (HCC) [E11.9] 06/17/2013  . Diabetes mellitus, new onset (HCC) [E11.9] 06/17/2013    Total Time spent with patient: 30 minutes  Musculoskeletal: Strength & Muscle Tone: within normal limits Gait & Station: normal Patient leans: N/A  Psychiatric Specialty Exam: Review of Systems  Constitutional: Negative for chills and fever.  Respiratory: Negative for cough and shortness of breath.   Cardiovascular: Negative for chest pain.  Gastrointestinal: Negative for abdominal pain, heartburn, nausea and vomiting.  Psychiatric/Behavioral: Negative for depression, hallucinations and suicidal ideas. The patient is not nervous/anxious and does not have  insomnia.     Blood pressure 129/82, pulse 89, temperature 99 F (37.2 C), temperature source Oral, resp. rate 18, height 6\' 3"  (1.905 m),  weight 89.8 kg, SpO2 100 %.Body mass index is 24.75 kg/m.  General Appearance: Casual and Fairly Groomed  Patent attorney::  Good  Speech:  Clear and Coherent and Normal Rate  Volume:  Normal  Mood:  Euthymic  Affect:  Congruent and Constricted  Thought Process:  Coherent and Goal Directed  Orientation:  Full (Time, Place, and Person)  Thought Content:  Logical  Suicidal Thoughts:  No  Homicidal Thoughts:  No  Memory:  Immediate;   Good Recent;   Good Remote;   Good  Judgement:  Fair  Insight:  Lacking  Psychomotor Activity:  Normal  Concentration:  Good  Recall:  Good  Fund of Knowledge:Fair  Language: Fair  Akathisia:  No  Handed:    AIMS (if indicated):     Assets:  Communication Skills Desire for Improvement Physical Health Resilience Social Support  Sleep:  Number of Hours: 6.75  Cognition: WNL  ADL's:  Intact    Mental Status Per Nursing Assessment::   On Admission:  Suicidal ideation indicated by patient(passive SI)  Demographic Factors:  Male, Adolescent or young adult, Caucasian, Living alone and Unemployed  Loss Factors: Decrease in vocational status and Financial problems/change in socioeconomic status  Historical Factors: Prior suicide attempts, Family history of suicide, Family history of mental illness or substance abuse and Impulsivity  Risk Reduction Factors:   Positive social support, Positive therapeutic relationship and Positive coping skills or problem solving skills  Continued Clinical Symptoms:  Severe Anxiety and/or Agitation Depression:   Severe Personality Disorders:   Cluster B More than one psychiatric diagnosis Unstable or Poor Therapeutic Relationship  Cognitive Features That Contribute To Risk:  None    Suicide Risk:  Minimal: No identifiable suicidal ideation.  Patients presenting with no  risk factors but with morbid ruminations; may be classified as minimal risk based on the severity of the depressive symptoms  Follow-up Information    [facility] Follow up on 03/05/2018.   Why:  Friday at 8AM with Nicole Cella for your hopsital follow up appointment. Please bring your [facility] card and your hopsital follow up paperwork Do not print this until I have a new appointment.  Thanx.  Rod at 1:45 PM Contact information: [address] [place] [address] [phone]         Subjective Data:  [name] [name] is an 17 y/o M with history of MDD, personality disorder, ADHD, (and elsewhere schizophrenia), who was admitted from [facility] on [facility]C initiated at [facility] after pt was at his outpatient follow up appointment and verbalized suicidal ideation with plan and worsening depression. Pt was medically cleared at [facility] and then transferred to [facility] for additional treatment and stabilization.He was restarted on previous medication of thorazine, and then he was also started on trial of lexapro.He was changed from lexapro to prozac after he restarted refusing lexapro.He has been reporting improvement of his presenting symptoms. SW team has been working with pt's PO to arrange for placement at safe housing after discharge as pt cannot return to stay with his father.  Today upon evaluation, pt shares, "I'm alright." He denies any specific concerns. He requests  For discharge. He is sleeping well. His appetite is good. He denies other physical complaints.  He denies SI/HI/AH/VH. He is tolerating his medications well and he is in agreement to continue his current regimen without changes. Pt has been in contact with PO and the [place] [facility] regarding possible placement at a structured living program, and he will look into this  possibility after discharge. Pt plans to stay at a shelter immediately after discharge which he feels is a safe arrangement, and he will work with PO. He was able to engage in safety planning  including plan to return to [facility] or contact emergency services if he feels unable to maintain his own safety or the safety of others. Pt had no further questions, comments, or concerns.    Plan Of Care/Follow-up recommendations:    -Discharge to outpatient level of care  -MDD, recurrent, severe, with psychosis -Continue Thorazine 100mg  po qhs -Continue prozac 20mg poqday  Insomnia -Continue with Trazodone 100 mgpo qhs prn insomnia  EPS -Continue cogentin 1mg  po q12h prn EPS  Anxiety -Continue vistaril 50mg  po q6h prn anxiety  Dm:  -Resume home DM management -Encouraged carb counting as was suggested by patient   Activity:  as tolerated Diet:  normal Tests:  NA Other:  see above for DC plan  Micheal Likens, MD 03/09/2018, 2:03 PM

## 2018-03-09 NOTE — Discharge Summary (Addendum)
Physician Discharge Summary Note  Patient:  [name] Meisenheimer xxxVarela [name]. is an 17 y.o., male  MRN:  [id number]  DOB:  [birth year]  Patient phone:  [phone] (home)   Patient address:   [address] [place] [address],   Total Time spent with patient: Greater than 30 minutes  Date of Admission:  02/25/2018  Date of Discharge: 03-09-18  Reason for Admission: Suicidal ideation with plan and worsening depression.   Principal Problem: MDD (major depressive disorder), recurrent, severe, with psychosis [facility])  Discharge Diagnoses: Patient Active Problem List   Diagnosis Date Noted  . MDD (major depressive disorder), recurrent severe, without psychosis (HCC) [F33.2] 03/11/2015    Priority: High  . Borderline personality disorder (HCC) [F60.3] 02/26/2018  . MDD (major depressive disorder), recurrent, severe, with psychosis (HCC) [F33.3] 02/25/2018  . Major depressive disorder, recurrent severe without psychotic features (HCC) [F33.2] 07/24/2017  . DKA, type 1 (HCC) [E10.10] 07/16/2017  . Nausea & vomiting [R11.2] 07/16/2017  . Uncontrolled type 1 diabetes circulatory disorder erectile dysfunction (HCC) [E10.59, N52.1, E10.65]   . DKA (diabetic ketoacidoses) (HCC) [E13.10] 12/18/2016  . History of seizures [Z87.898] 12/01/2016  . History of migraine [Z86.69] 12/01/2016  . Disordered eating [F50.9] 03/08/2015  . Ketonuria [R82.4]   . Adjustment reaction to medical therapy [F43.20]   . Non compliance w medication regimen [Z91.14]   . 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 Dehydration [E86.0] 08/22/2014  . Hyperglycemia due to type 1 diabetes mellitus (HCC) [E10.65] 08/21/2014  . Type 1 diabetes mellitus with hyperglycemia (HCC) [E10.65]   . Noncompliance [Z91.19]   . Generalized abdominal pain [R10.84]   . Glycosuria [R81]   . Depression [F32.9] 07/12/2014  . Involuntary movements [R25.9] 06/13/2014  . Bilateral leg  pain [M79.604, M79.605] 06/13/2014  . 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 Suicidal ideation [R45.851] 03/31/2014  . ODD (oppositional defiant disorder) [F91.3] 03/03/2014  . MDD (major depressive disorder), recurrent episode, moderate (HCC) [F33.1] 02/16/2014  . 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 Microcytic anemia [D50.9] 02/15/2014  . Hyperglycemia [R73.9] 02/14/2014  . Goiter [E04.9] 02/04/2014  . 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 Acquired acanthosis nigricans [L83] 02/04/2014  . Obesity, morbid (HCC) [E66.01] 02/04/2014  . Insulin resistance [E88.81] 02/04/2014  . 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 Hypoglycemia associated with diabetes (HCC) [E11.649] 02/02/2014  . Maladaptive health behaviors affecting medical condition [F54] 02/02/2014  . Hypoglycemia [E16.2] 02/02/2014  . Type 1 diabetes mellitus in patient 29 to 19 years of age with hemoglobin A1c goal of less than 7.5% (HCC) [E10.9] 01/26/2014  . 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 Migraine without aura [G43.009] 12/13/2013  . Body mass index, pediatric, greater than or equal to 95th percentile for age [facility] 12/13/2013  . Asthma [J45.909] 12/13/2013  . Hypoglycemia unawareness in type 1 diabetes mellitus (HCC) [E10.649] 08/08/2013  . Seizure disorder (HCC) [G40.909] 07/06/2013  . Short-term memory loss [R41.3] 07/06/2013  . Sickle cell trait (HCC) [D57.3] 07/06/2013  . Diabetes (HCC) [E11.9] 06/17/2013  . Diabetes mellitus, new onset (HCC) [E11.9] 06/17/2013   Past Psychiatric History: Mdd severe.  Past Medical History:  Past Medical History:  Diagnosis Date  . ADD (attention deficit disorder)   . Allergy   . Anxiety   .  Asthma   . Depression   . Epileptic seizure (HCC)    "both petit and grand mal; last sz ~ 2 wk ago" (06/17/2013)  . Headache(784.0)    "2-3 times/wk usually" (06/17/2013)  . Heart murmur    "heard a slight one earlier today" (06/17/2013)  . Migraine    "maybe once/month; it's severe" (06/17/2013)  . ODD (oppositional defiant disorder)   . Sickle cell trait (HCC)   . Type I diabetes mellitus (HCC)    "that's what they're thinking now" (06/17/2013)  . Vision abnormalities    "takes him longer to focus cause; from his sz" (06/17/2013)    Past Surgical History:  Procedure Laterality Date  . CIRCUMCISION  2000  . FINGER SURGERY Left 2001   "crushed pinky; had to repair it" (06/17/2013)   Family History:  Family History  Problem Relation Age of Onset  . Asthma Mother   . COPD Mother   . Other Mother        possible autoimmune, unclear  . Diabetes Maternal Grandmother   . Heart disease Maternal Grandmother   . Hypertension Maternal Grandmother   . Mental illness Maternal Grandmother   . Heart disease Maternal Grandfather   . Hyperlipidemia Paternal Grandmother   . Hyperlipidemia Paternal Grandfather   . Migraines Sister        Hemiplegic Migraines    Family Psychiatric  History: See H&P Social History:  Social History   Substance and Sexual Activity  Alcohol Use No  . Alcohol/week: 0.0 standard drinks     Social History   Substance and Sexual Activity  Drug Use No    Social History   Socioeconomic History  . Marital status: Single    Spouse name: Not on file  . Number of children: Not on file  . Years of education: Not on file  . Highest education level: Not on file  Occupational History  . Not on file  Social Needs  . Financial resource strain: Not on file  . Food insecurity:    Worry: Not on file    Inability: Not on file  . Transportation needs:    Medical: Not on file    Non-medical: Not on file  Tobacco Use  . Smoking status: Passive Smoke  Exposure - Never Smoker  . Smokeless tobacco: Never Used  . Tobacco comment: Mom and dad smoke outside   Substance and Sexual Activity  . Alcohol use: No    Alcohol/week: 0.0 standard drinks  . Drug use: No  . Sexual activity: Never  Lifestyle  . Physical activity:    Days per week: Not on file    Minutes per session: Not on file  . Stress: Not on file  Relationships  . Social connections:    Talks on phone: Not on file    Gets together: Not on file    Attends religious service: Not on file    Active member of club or organization: Not on file    Attends meetings of clubs or organizations: Not on file    Relationship status: Not on file  Other Topics Concern  . Not on file  Social History Narrative   Lives at home with father, stepmother, 69 year old brother.   Hospital Course: (Per Md's discharge SRA): Hy Swiatek is an 17 y/o M with history of MDD, personality disorder, ADHD, (and elsewhere schizophrenia), who was [facility] at [facility] after pt was at his outpatient follow up appointment  and verbalized suicidal ideation with plan and worsening depression. Pt was medically cleared at [facility] and then transferred to [facility] for additional treatment and stabilization.He was restarted on previous medication of thorazine, and then he was also started on trial of lexapro.He was changed from lexapro to prozac after he restarted refusing lexapro.He has been reporting improvement of his presenting symptoms. SW team has been working with pt's PO to arrange for placement at safe housing after discharge as pt cannot return to stay with his father.  Today upon evaluation, ptshares, "I'm alright." He denies any specific concerns. He requests  For discharge. He is sleeping well. His appetite is good. He denies other physical complaints.He denies SI/HI/AH/VH.He is tolerating his medications well and he is in agreement to continue his current regimen without changes. Pt has  been in contact with PO and the [place] [facility] regarding possible placement at a structured living program, and he will look into this possibility after discharge. Pt plans to stay at a shelter immediately after discharge which he feels is a safe arrangement, and he will work with PO. He was able to engage in safety planning including plan to return to [facility] or contact emergency services if he feels unable to maintain his own safety or the safety of others. Pt had no further questions, comments, or concerns.   Plan Of Care/Follow-up recommendations:    -Discharge to outpatient level of care  -MDD, recurrent, severe, with psychosis -Continue Thorazine 100mg  po qhs -Continue prozac 20mg poqday  Insomnia -Continue with Trazodone 100 mgpo qhs prn insomnia  EPS -Continue cogentin 1mg  po q12h prn EPS  Anxiety -Continue vistaril 50mg  po q6h prn anxiety  Dm:  -Resume home DM management -Encouraged carb counting as was suggested by patient   Activity:  as tolerated Diet:  normal Tests:  NA Other:  see above for DC plan  Physical Findings: AIMS: Facial and Oral Movements Muscles of Facial Expression: None, normal Lips and Perioral Area: None, normal Jaw: None, normal Tongue: None, normal,Extremity Movements Upper (arms, wrists, hands, fingers): None, normal Lower (legs, knees, ankles, toes): None, normal, Trunk Movements Neck, shoulders, hips: None, normal, Overall Severity Severity of abnormal movements (highest score from questions above): None, normal Incapacitation due to abnormal movements: None, normal Patient's awareness of abnormal movements (rate only patient's report): No Awareness, Dental Status Current problems with teeth and/or dentures?: No Does patient usually wear dentures?: No  CIWA:  CIWA-Ar Total: 0 COWS:     Musculoskeletal: Strength & Muscle Tone:  within normal limits Gait & Station: normal Patient leans: N/A  Psychiatric Specialty Exam: Physical Exam  Constitutional: He appears well-developed.  HENT:  Head: Normocephalic.  Eyes: Pupils are equal, round, and reactive to light.  Neck: Normal range of motion.  Cardiovascular: Normal rate.  Respiratory: Effort normal.  GI: Soft.    ROS  Blood pressure 129/82, pulse 89, temperature 99 F (37.2 C), temperature source Oral, resp. rate 18, height 6\' 3"  (1.905 m), weight 89.8 kg, SpO2 100 %.Body mass index is 24.75 kg/m.  See Md's SRA   Have you used any form of tobacco in the last 30 days? (Cigarettes, Smokeless Tobacco, Cigars, and/or Pipes): Yes  Has this patient used any form of tobacco in the last 30 days? (Cigarettes, Smokeless Tobacco, Cigars, and/or Pipes): Yes, an FDA-approved tobacco cessation medication was offered at discharge.  Blood Alcohol level:  Lab Results  Component Value Date   ETH <10 02/23/2018   ETH <10 08/14/2017   Metabolic  Disorder Labs:  Lab Results  Component Value Date   HGBA1C 10.1 (H) 01/26/2017   MPG 243 01/26/2017   MPG 263 12/18/2016   No results found for: PROLACTIN Lab Results  Component Value Date   TRIG 244 (H) 12/18/2016   See Psychiatric Specialty Exam and Suicide Risk Assessment completed by Attending Physician prior to discharge.  Discharge destination:  Home  Is patient on multiple antipsychotic therapies at discharge:  No   Has Patient had three or more failed trials of antipsychotic monotherapy by history:  No  Recommended Plan for Multiple Antipsychotic Therapies: NA  Allergies as of 03/09/2018      Reactions   Bee Venom Anaphylaxis   Fish Allergy Anaphylaxis   Shellfish Allergy Anaphylaxis      Medication List    STOP taking these medications   cetirizine 10 MG tablet Commonly known as:  ZYRTEC   escitalopram 20 MG tablet Commonly known as:  LEXAPRO   gabapentin 300 MG capsule Commonly known as:   NEURONTIN   SUMAtriptan 25 MG tablet Commonly known as:  IMITREX     TAKE these medications     Indication  albuterol 108 (90 Base) MCG/ACT inhaler Commonly known as:  PROVENTIL HFA;VENTOLIN HFA Inhale 2 puffs into the lungs every 6 (six) hours as needed for wheezing or shortness of breath.  Indication:  Asthma   chlorproMAZINE 100 MG tablet Commonly known as:  THORAZINE Take 1 tablet (100 mg total) by mouth at bedtime. For mood control What changed:    medication strength  how much to take  when to take this  Indication:  Mood control   EPINEPHrine 0.3 mg/0.3 mL Soaj injection Commonly known as:  EPI-PEN Inject 0.3 mLs (0.3 mg total) into the muscle as needed (PRN for anaphylaxis).  Indication:  Life-Threatening Hypersensitivity Reaction   FLUoxetine 20 MG capsule Commonly known as:  PROZAC Take 1 capsule (20 mg total) by mouth daily. For depression Start taking on:  03/10/2018  Indication:  Major Depressive Disorder   glucagon 1 MG injection Inject 1 mg into the muscle once as needed (severe hypoglycemia - if unresponsive, unable to swallow, unconscious and/or has seizure).  Indication:  Extremely Low Blood Sugar, Severe hypoglycemia   glucose blood test strip Check glucose 6x daily: For blood sugar checks What changed:  Another medication with the same name was removed. Continue taking this medication, and follow the directions you see here.  Indication:  Blood sugar checks   hydrOXYzine 50 MG tablet Commonly known as:  ATARAX/VISTARIL Take 1 tablet (50 mg total) by mouth every 6 (six) hours as needed for anxiety. What changed:    medication strength  how much to take  Indication:  Feeling Anxious   insulin aspart 100 UNIT/ML FlexPen Commonly known as:  NOVOLOG Inject 0-15 Units into the skin 3 (three) times daily with meals. (Sliding scale): For diabetes management What changed:    how much to take  how to take this  when to take this  additional  instructions  Indication:  Insulin-Dependent Diabetes   insulin glargine 100 UNIT/ML injection Commonly known as:  LANTUS Inject 0.35 mLs (35 Units total) into the skin 2 (two) times daily. For diabetes management  Indication:  Insulin-Dependent Diabetes   nicotine 21 mg/24hr patch Commonly known as:  NICODERM CQ - dosed in mg/24 hours Place 1 patch (21 mg total) onto the skin daily. (May purchase from over the counter): For smoking cessation Start taking on:  03/10/2018  What changed:  additional instructions  Indication:  Nicotine Addiction   traZODone 100 MG tablet Commonly known as:  DESYREL Take 1 tablet (100 mg total) by mouth at bedtime as needed for sleep. What changed:    how much to take  how to take this  when to take this  reasons to take this  additional instructions  Indication:  Trouble Sleeping      Follow-up Information    [facility] Follow up on 03/05/2018.   Why:  Friday at 8AM with Nicole Cella for your hopsital follow up appointment. Please bring your MCD card and your hopsital follow up appointment Contact information: [address] [place] [address] [phone]          Follow-up recommendations: Activity:  As tolerated Diet: As recommended by your primary care doctor. Keep all scheduled follow-up appointments as recommended.   Comments: Patient is instructed prior to discharge to: Take all medications as prescribed by his/her mental healthcare provider. Report any adverse effects and or reactions from the medicines to his/her outpatient provider promptly. Patient has been instructed & cautioned: To not engage in alcohol and or illegal drug use while on prescription medicines. In the event of worsening symptoms, patient is instructed to call the crisis hotline, 911 and or go to the nearest ED for appropriate evaluation and treatment of symptoms. To follow-up with his/her primary care provider for your other medical issues, concerns and or health  care needs.   Signed: Armandina Stammer, NP, PMHNP, FNP-BC. 03/09/2018, 10:06 AM   Patient seen, Suicide Assessment Completed.  Disposition Plan Reviewed

## 2018-03-09 NOTE — Progress Notes (Signed)
Patient ID: [name] [name] xxxVarela [name]., male   DOB: March 01, 2001, 17 y.o.   MRN: [id number] Patient discharged to home/self care.  Patient denies SI, HI and AVH upon discharge and discharged on his own accord.  Patient acknowledged understanding of all discharge instructions and receipt of all personal belongings.

## 2018-03-09 NOTE — Progress Notes (Signed)
Recreation Therapy Notes    Date: 9.10.19 Time: 1000 Location: 500 [address] Dayroom  Group Topic: Anger Management  Goal Area(s) Addresses:  Patient will identify triggers for anger.  Patient will identify physical reaction to anger.   Patient will identify benefit of using coping skills when angry.  Behavioral Response: Engaged  Intervention: Worksheet, pencils  Activity: Anger Thermometer.  Patients were to rank at least 8 things that get them angry on the thermometer from 1- 10 ( 1 being the lowest and 10 being the highest).  Patients were to then identify five coping skills they can use to help them deal with their anger.  Education: Anger Management, Discharge Planning   Education Outcome: Acknowledges education/In group clarification offered/Needs additional education.   Clinical Observations/Feedback: Patient identified the things that get him angry as 10- people calling him weird, 9- abuse of women, 8- liars, 7- thieves, 6 - yelling, 5- dad, 4- mom and 3- people telling me what to do.  Pt stated his coping skills were music, boxing, basketball, rapping and flirting/girls.  Pt stated not using his positive coping skills "has gotten me this" pointing to his ankle bracelet.  Pt was appropriate during group.       Caroll Rancher, LRT/CTRS       Lillia Abed, Adonai Selsor A 03/09/2018 11:00 AM

## 2018-03-09 NOTE — Progress Notes (Addendum)
  [facility] Adult Case Management Discharge Plan :  Will you be returning to the same living situation after discharge:  No. States he will go to [employer] to see about openings At discharge, do you have transportation home?: Yes,  friend Do you have the ability to pay for your medications: Yes,  mental health  Release of information consent forms completed and in the chart;  Patient's signature needed at discharge.  Patient to Follow up at: Follow-up Information    [facility] Follow up on 03/05/2018.   Why:  Friday at 8AM with Nicole Cella for your hopsital follow up appointment. Please bring your MCD card and your hopsital follow up paperwork Do not print this until I have a new appointment.  Thanx.  Rod at 1:45 PM Contact information: [address] [place] [address] [phone]           Next level of care provider has access to [facility] Link:no  Safety Planning and Suicide Prevention discussed: Yes,  yes  Have you used any form of tobacco in the last 30 days? (Cigarettes, Smokeless Tobacco, Cigars, and/or Pipes): Yes  Has patient been referred to the Quitline?: Patient refused referral  Patient has been referred for addiction treatment: Pt. refused referral  Ida Rogue, LCSW 03/09/2018, 1:48 PM

## 2018-03-09 NOTE — BHH Counselor (Signed)
Received call from Mr Earley Favor, [place], who had heard from [facility] about plan to d/c today.  He wanted to make sure he had a good working number for [employer].  I transferred him to unit to ask [facility] directly.  He expressed no other questions or concerns.

## 2018-03-11 ENCOUNTER — Encounter (HOSPITAL_COMMUNITY): Payer: Self-pay | Admitting: Emergency Medicine

## 2018-03-11 ENCOUNTER — Emergency Department (HOSPITAL_COMMUNITY)
Admission: EM | Admit: 2018-03-11 | Discharge: 2018-03-12 | Disposition: A | Discharge status: Home / Self Care | Payer: Medicaid Other | Attending: Emergency Medicine | Admitting: Emergency Medicine

## 2018-03-11 ENCOUNTER — Other Ambulatory Visit: Payer: Self-pay

## 2018-03-11 DIAGNOSIS — Z7722 Contact with and (suspected) exposure to environmental tobacco smoke (acute) (chronic): Secondary | ICD-10-CM | POA: Diagnosis not present

## 2018-03-11 DIAGNOSIS — Z590 Homelessness: Secondary | ICD-10-CM | POA: Diagnosis not present

## 2018-03-11 DIAGNOSIS — R45851 Suicidal ideations: Secondary | ICD-10-CM | POA: Insufficient documentation

## 2018-03-11 DIAGNOSIS — F331 Major depressive disorder, recurrent, moderate: Secondary | ICD-10-CM | POA: Diagnosis not present

## 2018-03-11 DIAGNOSIS — Z79899 Other long term (current) drug therapy: Secondary | ICD-10-CM | POA: Insufficient documentation

## 2018-03-11 DIAGNOSIS — T50992A Poisoning by other drugs, medicaments and biological substances, intentional self-harm, initial encounter: Secondary | ICD-10-CM | POA: Insufficient documentation

## 2018-03-11 DIAGNOSIS — X838XXA Intentional self-harm by other specified means, initial encounter: Secondary | ICD-10-CM | POA: Diagnosis not present

## 2018-03-11 DIAGNOSIS — E1065 Type 1 diabetes mellitus with hyperglycemia: Secondary | ICD-10-CM | POA: Insufficient documentation

## 2018-03-11 DIAGNOSIS — J45909 Unspecified asthma, uncomplicated: Secondary | ICD-10-CM | POA: Insufficient documentation

## 2018-03-11 DIAGNOSIS — T1491XA Suicide attempt, initial encounter: Secondary | ICD-10-CM | POA: Diagnosis not present

## 2018-03-11 DIAGNOSIS — Y929 Unspecified place or not applicable: Secondary | ICD-10-CM | POA: Diagnosis not present

## 2018-03-11 DIAGNOSIS — T50902A Poisoning by unspecified drugs, medicaments and biological substances, intentional self-harm, initial encounter: Secondary | ICD-10-CM

## 2018-03-11 DIAGNOSIS — Z794 Long term (current) use of insulin: Secondary | ICD-10-CM | POA: Diagnosis not present

## 2018-03-11 DIAGNOSIS — F329 Major depressive disorder, single episode, unspecified: Secondary | ICD-10-CM | POA: Diagnosis present

## 2018-03-11 DIAGNOSIS — Z046 Encounter for general psychiatric examination, requested by authority: Secondary | ICD-10-CM | POA: Diagnosis not present

## 2018-03-11 DIAGNOSIS — E109 Type 1 diabetes mellitus without complications: Secondary | ICD-10-CM | POA: Insufficient documentation

## 2018-03-11 DIAGNOSIS — Y999 Unspecified external cause status: Secondary | ICD-10-CM | POA: Insufficient documentation

## 2018-03-11 DIAGNOSIS — Y9389 Activity, other specified: Secondary | ICD-10-CM | POA: Diagnosis not present

## 2018-03-11 LAB — CBG MONITORING, ED
GLUCOSE-CAPILLARY: 342 mg/dL — AB (ref 70–99)
Glucose-Capillary: 383 mg/dL — ABNORMAL HIGH (ref 70–99)

## 2018-03-11 LAB — CBC
HEMATOCRIT: 41.3 % (ref 39.0–52.0)
HEMOGLOBIN: 14.4 g/dL (ref 13.0–17.0)
MCH: 28.5 pg (ref 26.0–34.0)
MCHC: 34.9 g/dL (ref 30.0–36.0)
MCV: 81.6 fL (ref 78.0–100.0)
Platelets: 230 10*3/uL (ref 150–400)
RBC: 5.06 MIL/uL (ref 4.22–5.81)
RDW: 12.9 % (ref 11.5–15.5)
WBC: 5.6 10*3/uL (ref 4.0–10.5)

## 2018-03-11 LAB — COMPREHENSIVE METABOLIC PANEL
ALBUMIN: 3.6 g/dL (ref 3.5–5.0)
ALK PHOS: 99 U/L (ref 38–126)
ALT: 45 U/L — ABNORMAL HIGH (ref 0–44)
ANION GAP: 11 (ref 5–15)
AST: 43 U/L — AB (ref 15–41)
BILIRUBIN TOTAL: 1.5 mg/dL — AB (ref 0.3–1.2)
BUN: 12 mg/dL (ref 6–20)
CALCIUM: 8.6 mg/dL — AB (ref 8.9–10.3)
CO2: 22 mmol/L (ref 22–32)
Chloride: 99 mmol/L (ref 98–111)
Creatinine, Ser: 0.77 mg/dL (ref 0.61–1.24)
GFR calc Af Amer: >60 mL/min (ref >60)
GFR calc non Af Amer: >60 mL/min (ref >60)
GLUCOSE: 497 mg/dL — AB (ref 70–99)
Potassium: 4.6 mmol/L (ref 3.5–5.1)
SODIUM: 132 mmol/L — AB (ref 135–145)
TOTAL PROTEIN: 6.2 g/dL — AB (ref 6.5–8.1)

## 2018-03-11 LAB — ETHANOL: Alcohol, Ethyl (B): <10 mg/dL (ref <10)

## 2018-03-11 LAB — ACETAMINOPHEN LEVEL

## 2018-03-11 LAB — SALICYLATE LEVEL: Salicylate Lvl: <7 mg/dL (ref 2.8–30.0)

## 2018-03-11 MED ORDER — INSULIN ASPART 100 UNIT/ML ~~LOC~~ SOLN
0.0000 [IU] | Freq: Three times a day (TID) | SUBCUTANEOUS | Status: DC
  Administered 2018-03-12: 8 [IU] via SUBCUTANEOUS
  Filled 2018-03-11: qty 1

## 2018-03-11 MED ORDER — INSULIN ASPART 100 UNIT/ML ~~LOC~~ SOLN
8.0000 [IU] | Freq: Once | SUBCUTANEOUS | Status: AC
  Administered 2018-03-11: 8 [IU] via SUBCUTANEOUS
  Filled 2018-03-11: qty 1

## 2018-03-11 MED ORDER — CHARCOAL ACTIVATED PO LIQD
50.0000 g | Freq: Once | ORAL | Status: DC
  Filled 2018-03-11: qty 240

## 2018-03-11 MED ORDER — INSULIN GLARGINE 100 UNIT/ML ~~LOC~~ SOLN
35.0000 [IU] | Freq: Two times a day (BID) | SUBCUTANEOUS | Status: DC
  Administered 2018-03-11 – 2018-03-12 (×2): 35 [IU] via SUBCUTANEOUS
  Filled 2018-03-11 (×3): qty 0.35

## 2018-03-11 MED ORDER — FLUOXETINE HCL 20 MG PO CAPS
20.0000 mg | ORAL_CAPSULE | Freq: Every day | ORAL | Status: DC
  Administered 2018-03-11 – 2018-03-12 (×2): 20 mg via ORAL
  Filled 2018-03-11 (×2): qty 1
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
## 2018-03-11 NOTE — ED Notes (Signed)
Bed: W[facility]42 Expected date:  Expected time:  Means of arrival:  Comments: Hold for rm 12 
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
## 2018-03-11 NOTE — ED Notes (Signed)
When triaging pt nodes yes to living by self then verbalizes "I'm homeless." When asking SI questions pt does not node or talk; with chief complaint will use SI precautions. AC called to request sitter.

## 2018-03-11 NOTE — Progress Notes (Signed)
TTS consulted with [name], NP who recommends inpt treatment. TTS to seek placement. TTS attempted to advised ED staff but did not get an answer with EDP or pt's nurse. TTS will advise ED staff of disposition when they are available to speak with TTS.   Princess Bruins, MSW, LCSW Therapeutic Triage Specialist  [phone]

## 2018-03-11 NOTE — ED Triage Notes (Addendum)
Per EMS pt took "handful of thorazine;" unknown specific amt or dose taken an hour ago. Pt not talking to staff but nodes to questions appropriately.

## 2018-03-11 NOTE — ED Notes (Signed)
Pt keeps removing BP cuff and SPO2 from monitor and himself. Will not keep them on.

## 2018-03-12 DIAGNOSIS — F331 Major depressive disorder, recurrent, moderate: Secondary | ICD-10-CM

## 2018-03-12 LAB — CBG MONITORING, ED: Glucose-Capillary: 256 mg/dL — ABNORMAL HIGH (ref 70–99)

## 2018-03-12 NOTE — ED Notes (Signed)
Patient is cleared by Merdis Delay from poison control.
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
## 2018-03-12 NOTE — Progress Notes (Signed)
CSW consulted to meet with patient regarding housing situation and to provide assistance with transportation. CSW spoke with patient who states that he is homeless and resides behind various buildings. Per patient, he has never lived in a shelter but has tried to get in at the [facility] but they don't have beds available. CSW provided patient with homeless shelter resource list, hot meals list and [facility] information. Patient also provided with bus pass. No further CSW needs at this time. Please reconsult if needs arise.  Archie Balboa, LCSWA  Clinical Social Work Department  [employer]  [phone]

## 2018-03-12 NOTE — [facility] Suicide Risk Assessment (Signed)
Suicide Risk Assessment  Discharge Assessment   [facility] Discharge Suicide Risk Assessment   Principal Problem: MDD (major depressive disorder), recurrent episode, moderate (HCC) Discharge Diagnoses:  Patient Active Problem List   Diagnosis Date Noted  . MDD (major depressive disorder), recurrent episode, moderate (HCC) [F33.1] 02/16/2014    Priority: High  . Borderline personality disorder (HCC) [F60.3] 02/26/2018  . MDD (major depressive disorder), recurrent, severe, with psychosis (HCC) [F33.3] 02/25/2018  . Major depressive disorder, recurrent severe without psychotic features (HCC) [F33.2] 07/24/2017  . DKA, type 1 (HCC) [E10.10] 07/16/2017  . Nausea & vomiting [R11.2] 07/16/2017  . Uncontrolled type 1 diabetes circulatory disorder erectile dysfunction (HCC) [E10.59, N52.1, E10.65]   . DKA (diabetic ketoacidoses) (HCC) [E13.10] 12/18/2016  . History of seizures [Z87.898] 12/01/2016  . History of migraine [Z86.69] 12/01/2016  . Disordered eating [F50.9] 03/08/2015  . Ketonuria [R82.4]   . Adjustment reaction to medical therapy [F43.20]   . Non compliance w medication regimen [Z91.14]   . 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 Dehydration [E86.0] 08/22/2014  . Hyperglycemia due to type 1 diabetes mellitus (HCC) [E10.65] 08/21/2014  . Type 1 diabetes mellitus with hyperglycemia (HCC) [E10.65]   . Noncompliance [Z91.19]   . Generalized abdominal pain [R10.84]   . Glycosuria [R81]   . Depression [F32.9] 07/12/2014  . Involuntary movements [R25.9] 06/13/2014  . Bilateral leg pain [M79.604, M79.605] 06/13/2014  . 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 Suicidal ideation [R45.851] 03/31/2014  . ODD (oppositional defiant disorder) [F91.3] 03/03/2014  . 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  Microcytic anemia [D50.9] 02/15/2014  . Hyperglycemia [R73.9] 02/14/2014  . Goiter [E04.9] 02/04/2014  . 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 Acquired acanthosis nigricans [L83] 02/04/2014  . Obesity, morbid (HCC) [E66.01] 02/04/2014  . Insulin resistance [E88.81] 02/04/2014  . 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 Hypoglycemia associated with diabetes (HCC) [E11.649] 02/02/2014  . Maladaptive health behaviors affecting medical condition [F54] 02/02/2014  . Hypoglycemia [E16.2] 02/02/2014  . Type 1 diabetes mellitus in patient 23 to 19 years of age with hemoglobin A1c goal of less than 7.5% (HCC) [E10.9] 01/26/2014  . 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 Migraine without aura [G43.009] 12/13/2013  . Body mass index, pediatric, greater than or equal to 95th percentile for age [facility] 12/13/2013  . Asthma [J45.909] 12/13/2013  . Hypoglycemia unawareness in type 1 diabetes mellitus (HCC) [E10.649] 08/08/2013  . Seizure disorder (HCC) [G40.909] 07/06/2013  . Short-term memory loss [R41.3] 07/06/2013  . Sickle cell trait (HCC) [D57.3] 07/06/2013  . Diabetes (HCC) [E11.9] 06/17/2013  . Diabetes mellitus, new onset (HCC) [E11.9] 06/17/2013    Total Time spent with patient: 45 minutes  Musculoskeletal: Strength & Muscle Tone: within normal limits Gait & Station: normal Patient leans: N/A  Psychiatric Specialty Exam: Physical Exam  Nursing note and vitals reviewed. Constitutional: He is oriented to person, place, and time. He appears well-developed and well-nourished.  HENT:  Head: Normocephalic.  Neck: Normal range of motion.  Respiratory: Effort normal.  Musculoskeletal: Normal range of motion.  Neurological: He is alert and oriented to person, place, and time.  Psychiatric: His speech is normal and behavior is normal. Judgment  and thought content normal. His mood appears anxious. His affect is blunt. Cognition and memory  are normal. He exhibits a depressed mood.    Review of Systems  Psychiatric/Behavioral: Positive for depression and substance abuse. The patient is nervous/anxious.   All other systems reviewed and are negative.   Blood pressure 137/88, pulse 82, temperature 97.6 F (36.4 C), temperature source Oral, resp. rate 16, SpO2 96 %.There is no height or weight on file to calculate BMI.  General Appearance: Casual  Eye Contact:  Good  Speech:  Normal Rate  Volume:  Normal  Mood:  Anxious and Depressed, mild  Affect:  Congruent  Thought Process:  Coherent and Descriptions of Associations: Intact  Orientation:  Full (Time, Place, and Person)  Thought Content:  WDL and Logical  Suicidal Thoughts:  No  Homicidal Thoughts:  No  Memory:  Immediate;   Good Recent;   Good Remote;   Good  Judgement:  Fair  Insight:  Fair  Psychomotor Activity:  Normal  Concentration:  Concentration: Good and Attention Span: Good  Recall:  Good  Fund of Knowledge:  Good  Language:  Good  Akathisia:  No  Handed:  Right  AIMS (if indicated):     Assets:  Leisure Time Physical Health Resilience Social Support  ADL's:  Intact  Cognition:  WNL  Sleep:       Mental Status Per Nursing Assessment::   On Admission:   accidental overdose  Demographic Factors:  Male and Adolescent or young adult  Loss Factors: Legal issues  Historical Factors: NA  Risk Reduction Factors:   Sense of responsibility to family, Living with another person, especially a relative, Positive social support and Positive therapeutic relationship  Continued Clinical Symptoms:  Depression, mild  Cognitive Features That Contribute To Risk:  None    Suicide Risk:  Minimal: No identifiable suicidal ideation.  Patients presenting with no risk factors but with morbid ruminations; may be classified as minimal risk based on the severity  of the depressive symptoms    Plan Of Care/Follow-up recommendations:  Activity:  as tolerted Diet:  heart healthy diet  Mckinzee Spirito, NP 03/12/2018, 11:39 AM

## 2018-03-12 NOTE — ED Notes (Signed)
Patient informed nurse that he would like to be discharged.  Nurse practitioner notified.

## 2018-03-12 NOTE — ED Notes (Signed)
Patient reports SI and denies HI/AVH at this time. Plan of care discussed. Encouragement and support provided and safety maintain. Q 15 min safety checks in place and video monitoring.

## 2018-03-12 NOTE — ED Notes (Signed)
Patient alert, oriented, calm and cooperative.  Denies any suicidal or homicidal ideation or auditory and visual hallucinations.  Patient does not appear to be depressed.  Responding pleasantly to staff.

## 2018-03-21 ENCOUNTER — Other Ambulatory Visit: Payer: Self-pay

## 2018-03-21 ENCOUNTER — Encounter (HOSPITAL_COMMUNITY): Payer: Self-pay | Admitting: Emergency Medicine

## 2018-03-21 ENCOUNTER — Emergency Department (HOSPITAL_COMMUNITY)
Admission: EM | Admit: 2018-03-21 | Discharge: 2018-03-21 | Discharge status: Against Medical Advice | Payer: Medicaid Other | Attending: Emergency Medicine | Admitting: Emergency Medicine

## 2018-03-21 DIAGNOSIS — Z794 Long term (current) use of insulin: Secondary | ICD-10-CM | POA: Diagnosis not present

## 2018-03-21 DIAGNOSIS — Z9119 Patient's noncompliance with other medical treatment and regimen: Secondary | ICD-10-CM | POA: Diagnosis not present

## 2018-03-21 DIAGNOSIS — Z590 Homelessness: Secondary | ICD-10-CM | POA: Insufficient documentation

## 2018-03-21 DIAGNOSIS — F329 Major depressive disorder, single episode, unspecified: Secondary | ICD-10-CM | POA: Insufficient documentation

## 2018-03-21 DIAGNOSIS — Z7722 Contact with and (suspected) exposure to environmental tobacco smoke (acute) (chronic): Secondary | ICD-10-CM | POA: Insufficient documentation

## 2018-03-21 DIAGNOSIS — G40909 Epilepsy, unspecified, not intractable, without status epilepticus: Secondary | ICD-10-CM | POA: Insufficient documentation

## 2018-03-21 DIAGNOSIS — R000 Tachycardia, unspecified: Secondary | ICD-10-CM | POA: Diagnosis not present

## 2018-03-21 DIAGNOSIS — J45909 Unspecified asthma, uncomplicated: Secondary | ICD-10-CM | POA: Diagnosis not present

## 2018-03-21 DIAGNOSIS — E1065 Type 1 diabetes mellitus with hyperglycemia: Secondary | ICD-10-CM | POA: Insufficient documentation

## 2018-03-21 DIAGNOSIS — Z79899 Other long term (current) drug therapy: Secondary | ICD-10-CM | POA: Diagnosis not present

## 2018-03-21 DIAGNOSIS — J029 Acute pharyngitis, unspecified: Secondary | ICD-10-CM | POA: Insufficient documentation

## 2018-03-21 DIAGNOSIS — R1084 Generalized abdominal pain: Secondary | ICD-10-CM | POA: Diagnosis not present

## 2018-03-21 DIAGNOSIS — F603 Borderline personality disorder: Secondary | ICD-10-CM | POA: Diagnosis not present

## 2018-03-21 LAB — COMPREHENSIVE METABOLIC PANEL
ALBUMIN: 4.7 g/dL (ref 3.5–5.0)
ALK PHOS: 93 U/L (ref 38–126)
ALT: 28 U/L (ref 0–44)
AST: 24 U/L (ref 15–41)
Anion gap: 17 — ABNORMAL HIGH (ref 5–15)
BILIRUBIN TOTAL: 1.5 mg/dL — AB (ref 0.3–1.2)
BUN: 13 mg/dL (ref 6–20)
CO2: 19 mmol/L — AB (ref 22–32)
Calcium: 9.4 mg/dL (ref 8.9–10.3)
Chloride: 104 mmol/L (ref 98–111)
Creatinine, Ser: 0.79 mg/dL (ref 0.61–1.24)
GFR calc Af Amer: >60 mL/min (ref >60)
GFR calc non Af Amer: >60 mL/min (ref >60)
GLUCOSE: 78 mg/dL (ref 70–99)
POTASSIUM: 3.4 mmol/L — AB (ref 3.5–5.1)
SODIUM: 140 mmol/L (ref 135–145)
TOTAL PROTEIN: 8.1 g/dL (ref 6.5–8.1)

## 2018-03-21 LAB — CBC WITH DIFFERENTIAL/PLATELET
BASOS ABS: 0.1 10*3/uL (ref 0.0–0.1)
BASOS PCT: 1 %
EOS ABS: 0.1 10*3/uL (ref 0.0–0.7)
Eosinophils Relative: 1 %
HEMATOCRIT: 49.3 % (ref 39.0–52.0)
HEMOGLOBIN: 18 g/dL — AB (ref 13.0–17.0)
Lymphocytes Relative: 15 %
Lymphs Abs: 1.5 10*3/uL (ref 0.7–4.0)
MCH: 28.8 pg (ref 26.0–34.0)
MCHC: 36.5 g/dL — ABNORMAL HIGH (ref 30.0–36.0)
MCV: 78.9 fL (ref 78.0–100.0)
Monocytes Absolute: 0.8 10*3/uL (ref 0.1–1.0)
Monocytes Relative: 8 %
NEUTROS ABS: 7.7 10*3/uL (ref 1.7–7.7)
NEUTROS PCT: 75 %
Platelets: 279 10*3/uL (ref 150–400)
RBC: 6.25 MIL/uL — ABNORMAL HIGH (ref 4.22–5.81)
RDW: 12.4 % (ref 11.5–15.5)
WBC: 10.2 10*3/uL (ref 4.0–10.5)

## 2018-03-21 LAB — BASIC METABOLIC PANEL
Anion gap: 16 — ABNORMAL HIGH (ref 5–15)
BUN: 11 mg/dL (ref 6–20)
CALCIUM: 8.1 mg/dL — AB (ref 8.9–10.3)
CHLORIDE: 104 mmol/L (ref 98–111)
CO2: 20 mmol/L — AB (ref 22–32)
CREATININE: 0.9 mg/dL (ref 0.61–1.24)
GFR calc Af Amer: >60 mL/min (ref >60)
GFR calc non Af Amer: >60 mL/min (ref >60)
GLUCOSE: 313 mg/dL — AB (ref 70–99)
Potassium: 4.2 mmol/L (ref 3.5–5.1)
Sodium: 140 mmol/L (ref 135–145)

## 2018-03-21 LAB — CBG MONITORING, ED
GLUCOSE-CAPILLARY: 131 mg/dL — AB (ref 70–99)
Glucose-Capillary: 76 mg/dL (ref 70–99)
Glucose-Capillary: 317 mg/dL — ABNORMAL HIGH (ref 70–99)

## 2018-03-21 LAB — I-STAT CG4 LACTIC ACID, ED: LACTIC ACID, VENOUS: 1.76 mmol/L (ref 0.5–1.9)

## 2018-03-21 LAB — URINALYSIS, ROUTINE W REFLEX MICROSCOPIC
BACTERIA UA: NONE SEEN
BILIRUBIN URINE: NEGATIVE
Glucose, UA: >=500 mg/dL — AB
HGB URINE DIPSTICK: NEGATIVE
KETONES UR: 80 mg/dL — AB
LEUKOCYTES UA: NEGATIVE
NITRITE: NEGATIVE
PROTEIN: 30 mg/dL — AB
SPECIFIC GRAVITY, URINE: 1.016 (ref 1.005–1.030)
pH: 6 (ref 5.0–8.0)

## 2018-03-21 LAB — GROUP A STREP BY PCR: GROUP A STREP BY PCR: NOT DETECTED

## 2018-03-21 LAB — LIPASE, BLOOD: Lipase: 19 U/L (ref 11–51)

## 2018-03-21 MED ORDER — DEXTROSE-NACL 5-0.45 % IV SOLN: INTRAVENOUS | Status: DC

## 2018-03-21 MED ORDER — SODIUM CHLORIDE 0.9 % IV BOLUS
1000.0000 mL | Freq: Once | INTRAVENOUS | Status: AC
  Administered 2018-03-21: 1000 mL via INTRAVENOUS

## 2018-03-21 MED ORDER — INSULIN GLARGINE 100 UNIT/ML ~~LOC~~ SOLN: 35.0000 [IU] | Freq: Two times a day (BID) | SUBCUTANEOUS | 1 refills | Status: DC

## 2018-03-21 MED ORDER — INSULIN ASPART 100 UNIT/ML ~~LOC~~ SOLN
2.0000 [IU] | Freq: Once | SUBCUTANEOUS | Status: AC
  Administered 2018-03-21: 2 [IU] via SUBCUTANEOUS
  Filled 2018-03-21: qty 1

## 2018-03-21 MED ORDER — POTASSIUM CHLORIDE 10 MEQ/100ML IV SOLN: 10.0000 meq | INTRAVENOUS | Status: DC

## 2018-03-21 MED ORDER — ONDANSETRON HCL 4 MG/2ML IJ SOLN
4.0000 mg | Freq: Once | INTRAMUSCULAR | Status: AC
  Administered 2018-03-21: 4 mg via INTRAVENOUS
  Filled 2018-03-21: qty 2

## 2018-03-21 MED ORDER — SODIUM CHLORIDE 0.9 % IV SOLN: INTRAVENOUS | Status: DC

## 2018-03-21 MED ORDER — KETOROLAC TROMETHAMINE 15 MG/ML IJ SOLN
15.0000 mg | Freq: Once | INTRAMUSCULAR | Status: AC
  Administered 2018-03-21: 15 mg via INTRAVENOUS
  Filled 2018-03-21: qty 1

## 2018-03-21 MED ORDER — INSULIN ASPART 100 UNIT/ML FLEXPEN: PEN_INJECTOR | SUBCUTANEOUS | 1 refills | Status: DC
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
## 2018-03-21 NOTE — Discharge Instructions (Addendum)
You have evidence of significant dehydration and diabetes dysfunction.  You are also at high risk for significant issues, including disability or death.  You have acknowledged these risks and decided to leave AGAINST MEDICAL ADVICE. You are welcome and encouraged to return to the ED should you change your mind.  Should you decide not to return to the ED, please at least follow-up with a primary care provider.  A suggestion has been provided for you.

## 2018-03-21 NOTE — ED Notes (Signed)
Pt refuses to keep the monitor attached.

## 2018-03-21 NOTE — ED Notes (Signed)
Pt aware of need for urine, but is unable to provide on at this time.

## 2018-03-21 NOTE — ED Triage Notes (Signed)
Pt brought in by EMS and states he isn't feeling well since he hasn't had his insulin in 2-3 weeks. Pt c/o abdominal pain and sore throat. Pt lives in homeless shelter.

## 2018-03-22 ENCOUNTER — Encounter (HOSPITAL_COMMUNITY): Payer: Self-pay | Admitting: *Deleted

## 2018-03-22 ENCOUNTER — Emergency Department (HOSPITAL_COMMUNITY): Payer: Medicaid Other

## 2018-03-22 ENCOUNTER — Emergency Department (HOSPITAL_COMMUNITY)
Admission: EM | Admit: 2018-03-22 | Discharge: 2018-03-22 | Disposition: A | Discharge status: Home / Self Care | Payer: Medicaid Other | Attending: Emergency Medicine | Admitting: Emergency Medicine

## 2018-03-22 DIAGNOSIS — IMO0001 Reserved for inherently not codable concepts without codable children: Secondary | ICD-10-CM

## 2018-03-22 DIAGNOSIS — E1065 Type 1 diabetes mellitus with hyperglycemia: Secondary | ICD-10-CM | POA: Diagnosis not present

## 2018-03-22 DIAGNOSIS — F902 Attention-deficit hyperactivity disorder, combined type: Secondary | ICD-10-CM | POA: Insufficient documentation

## 2018-03-22 DIAGNOSIS — J45909 Unspecified asthma, uncomplicated: Secondary | ICD-10-CM | POA: Diagnosis not present

## 2018-03-22 DIAGNOSIS — R5383 Other fatigue: Secondary | ICD-10-CM | POA: Diagnosis present

## 2018-03-22 DIAGNOSIS — Z7722 Contact with and (suspected) exposure to environmental tobacco smoke (acute) (chronic): Secondary | ICD-10-CM | POA: Diagnosis not present

## 2018-03-22 DIAGNOSIS — R739 Hyperglycemia, unspecified: Secondary | ICD-10-CM

## 2018-03-22 LAB — BASIC METABOLIC PANEL
Anion gap: 17 — ABNORMAL HIGH (ref 5–15)
Anion gap: 15 (ref 5–15)
BUN: 10 mg/dL (ref 6–20)
BUN: 11 mg/dL (ref 6–20)
CALCIUM: 8.9 mg/dL (ref 8.9–10.3)
CHLORIDE: 100 mmol/L (ref 98–111)
CO2: 22 mmol/L (ref 22–32)
CO2: 23 mmol/L (ref 22–32)
CREATININE: 1.02 mg/dL (ref 0.61–1.24)
Calcium: 10.4 mg/dL — ABNORMAL HIGH (ref 8.9–10.3)
Chloride: 95 mmol/L — ABNORMAL LOW (ref 98–111)
Creatinine, Ser: 1.21 mg/dL (ref 0.61–1.24)
GFR calc non Af Amer: >60 mL/min (ref >60)
Glucose, Bld: 86 mg/dL (ref 70–99)
Glucose, Bld: 449 mg/dL — ABNORMAL HIGH (ref 70–99)
POTASSIUM: 3.3 mmol/L — AB (ref 3.5–5.1)
Potassium: 3.8 mmol/L (ref 3.5–5.1)
Sodium: 139 mmol/L (ref 135–145)
Sodium: 133 mmol/L — ABNORMAL LOW (ref 135–145)

## 2018-03-22 LAB — URINALYSIS, ROUTINE W REFLEX MICROSCOPIC
BILIRUBIN URINE: NEGATIVE
Bacteria, UA: NONE SEEN
Glucose, UA: >=500 mg/dL — AB
Hgb urine dipstick: NEGATIVE
KETONES UR: NEGATIVE mg/dL
LEUKOCYTES UA: NEGATIVE
NITRITE: NEGATIVE
PROTEIN: 30 mg/dL — AB
Specific Gravity, Urine: 1.021 (ref 1.005–1.030)
pH: 5 (ref 5.0–8.0)

## 2018-03-22 LAB — CBC
HEMATOCRIT: 54.5 % — AB (ref 39.0–52.0)
Hemoglobin: 18.8 g/dL — ABNORMAL HIGH (ref 13.0–17.0)
MCH: 27.5 pg (ref 26.0–34.0)
MCHC: 34.5 g/dL (ref 30.0–36.0)
MCV: 79.7 fL (ref 78.0–100.0)
Platelets: 328 10*3/uL (ref 150–400)
RBC: 6.84 MIL/uL — AB (ref 4.22–5.81)
RDW: 12.1 % (ref 11.5–15.5)
WBC: 11.2 10*3/uL — AB (ref 4.0–10.5)

## 2018-03-22 LAB — SALICYLATE LEVEL

## 2018-03-22 LAB — CBG MONITORING, ED
Glucose-Capillary: 95 mg/dL (ref 70–99)
Glucose-Capillary: 422 mg/dL — ABNORMAL HIGH (ref 70–99)

## 2018-03-22 LAB — ACETAMINOPHEN LEVEL: Acetaminophen (Tylenol), Serum: <10 ug/mL — ABNORMAL LOW (ref 10–30)

## 2018-03-22 LAB — ETHANOL: Alcohol, Ethyl (B): <10 mg/dL (ref <10)

## 2018-03-22 MED ORDER — INSULIN ASPART 100 UNIT/ML ~~LOC~~ SOLN
SUBCUTANEOUS | Status: AC
  Filled 2018-03-22: qty 1

## 2018-03-22 MED ORDER — INSULIN ASPART 100 UNIT/ML IV SOLN
8.0000 [IU] | Freq: Once | INTRAVENOUS | Status: AC
  Administered 2018-03-22: 8 [IU] via INTRAVENOUS

## 2018-03-22 MED ORDER — INSULIN GLARGINE 100 UNIT/ML ~~LOC~~ SOLN: 35.0000 [IU] | Freq: Two times a day (BID) | SUBCUTANEOUS | 1 refills | Status: DC

## 2018-03-22 MED ORDER — GLUCOSE BLOOD VI STRP: ORAL_STRIP | 0 refills | Status: DC

## 2018-03-22 MED ORDER — ONDANSETRON HCL 4 MG/2ML IJ SOLN
4.0000 mg | Freq: Once | INTRAMUSCULAR | Status: AC
  Administered 2018-03-22: 4 mg via INTRAVENOUS
  Filled 2018-03-22: qty 2

## 2018-03-22 MED ORDER — INSULIN ASPART 100 UNIT/ML FLEXPEN: PEN_INJECTOR | SUBCUTANEOUS | 1 refills | Status: DC

## 2018-03-22 MED ORDER — SODIUM CHLORIDE 0.9 % IV BOLUS
1000.0000 mL | Freq: Once | INTRAVENOUS | Status: AC
  Administered 2018-03-22: 1000 mL via INTRAVENOUS

## 2018-03-22 NOTE — ED Provider Notes (Signed)
Emergency Department Provider Note   I have reviewed the triage vital signs and the nursing notes.   HISTORY  Chief Complaint Hyperglycemia   HPI [name] [name]. is a 17 y.o. male with PMH of seizure, IDDM, migraine, and homelessness to the emergency department with continued fatigue with nausea and abdominal cramping.  He tells me he was in the emergency department at [facility] yesterday for DKA.  He states that he had to make an appointment at [facility] today so left [place].  He states that at that time he was not feeling particularly well but knew he cannot miss this appointment.  He denies any chest pain or shortness of breath.  He is unable to take any insulin because of his homelessness situation.  He does not check his blood sugars. He states he has not had anything to eat today.   Past Medical History:  Diagnosis Date  . ADD (attention deficit disorder)   . Allergy   . Anxiety   . Asthma   . Depression   . Epileptic seizure ([place])    "both petit and grand mal; last sz ~ 2 wk ago" (06/17/2013)  . Headache(784.0)    "2-3 times/wk usually" (06/17/2013)  . Heart murmur    "heard a slight one earlier today" (06/17/2013)  . Migraine    "maybe once/month; it's severe" (06/17/2013)  . ODD (oppositional defiant disorder)   . Sickle cell trait ([place])   . Type I diabetes mellitus ([place])    "that's what they're thinking now" (06/17/2013)  . Vision abnormalities    "takes him longer to focus cause; from his sz" (06/17/2013)    Patient Active Problem List   Diagnosis Date Noted  . Borderline personality disorder ([place]) 02/26/2018  . MDD (major depressive disorder), recurrent, severe, with psychosis ([place]) 02/25/2018  . Major depressive disorder, recurrent severe without psychotic features ([place]) 07/24/2017  . DKA, type 1 ([place]) 07/16/2017  . Nausea & vomiting 07/16/2017  . Uncontrolled type 1 diabetes circulatory disorder erectile dysfunction ([place])   . DKA  (diabetic ketoacidoses) ([place]) 12/18/2016  . History of seizures 12/01/2016  . History of migraine 12/01/2016  . Disordered eating 03/08/2015  . Ketonuria   . Adjustment reaction to medical therapy   . Non compliance w medication regimen   . Diabetic peripheral neuropathy associated with type 1 diabetes mellitus ([place]) 09/29/2014  . Dehydration 08/22/2014  . Hyperglycemia due to type 1 diabetes mellitus ([place]) 08/21/2014  . Type 1 diabetes mellitus with hyperglycemia ([place])   . Noncompliance   . Generalized abdominal pain   . Glycosuria   . Depression 07/12/2014  . Involuntary movements 06/13/2014  . Bilateral leg pain 06/13/2014  . 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 Suicidal ideation 03/31/2014  . ODD (oppositional defiant disorder) 03/03/2014  . MDD (major depressive disorder), recurrent episode, moderate ([place]) 02/16/2014  . Attention deficit hyperactivity disorder (ADHD), combined type, moderate 02/16/2014  . Microcytic anemia 02/15/2014  . Hyperglycemia 02/14/2014  . Goiter 02/04/2014  . 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[place]) 02/04/2014  . Acquired acanthosis nigricans 02/04/2014  . Obesity, morbid ([place]) 02/04/2014  . Insulin resistance 02/04/2014  . Hyperinsulinemia 02/04/2014  . Hypoglycemia associated with diabetes ([place]) 02/02/2014  . Maladaptive health behaviors affecting medical condition 02/02/2014  . Hypoglycemia 02/02/2014  . Type 1 diabetes mellitus in patient 20 to 19 years of age with hemoglobin A1c goal of less than 7.5% (  [employer]) 01/26/2014  . Partial epilepsy with impairment of consciousness ([place]) 12/13/2013  . 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 Migraine without aura 12/13/2013  . Body mass index, pediatric, greater than or equal to 95th percentile for age 71/16/2015  . Asthma 12/13/2013  . Hypoglycemia unawareness in type 1 diabetes mellitus ([place]) 08/08/2013  .  Seizure disorder ([place]) 07/06/2013  . Short-term memory loss 07/06/2013  . Sickle cell trait ([place]) 07/06/2013  . Diabetes ([place]) 06/17/2013  . Diabetes mellitus, new onset ([place]) 06/17/2013    Past Surgical History:  Procedure Laterality Date  . CIRCUMCISION  2000  . FINGER SURGERY Left 2001   "crushed pinky; had to repair it" (06/17/2013)    Allergies Bee venom; Fish allergy; and Shellfish allergy  Family History  Problem Relation Age of Onset  . Asthma Mother   . COPD Mother   . Other Mother        possible autoimmune, unclear  . Diabetes Maternal Grandmother   . Heart disease Maternal Grandmother   . Hypertension Maternal Grandmother   . Mental illness Maternal Grandmother   . Heart disease Maternal Grandfather   . Hyperlipidemia Paternal Grandmother   . Hyperlipidemia Paternal Grandfather   . Migraines Sister        Hemiplegic Migraines     Social History Social History   Tobacco Use  . Smoking status: Passive Smoke Exposure - Never Smoker  . Smokeless tobacco: Never Used  . Tobacco comment: Mom and dad smoke outside   Substance Use Topics  . Alcohol use: No    Alcohol/week: 0.0 standard drinks  . Drug use: No    Review of Systems  Constitutional: No fever/chills Eyes: No visual changes. ENT: No sore throat. Cardiovascular: Denies chest pain. Respiratory: Denies shortness of breath. Gastrointestinal: Positive diffuse abdominal pain. Positive nausea, no vomiting.  No diarrhea.  No constipation. Genitourinary: Negative for dysuria. Musculoskeletal: Negative for back pain. Skin: Negative for rash. Neurological: Negative for headaches, focal weakness or numbness.  10-point ROS otherwise negative.  ____________________________________________   PHYSICAL EXAM:  VITAL SIGNS: ED Triage Vitals [03/22/18 1501]  Enc Vitals Group     BP (!) 130/98     Pulse Rate (!) 137     Resp 20     Temp 98 F (36.7 C)     Temp Source Oral     SpO2 100 %     Pain  Score 8   Constitutional: Alert and oriented. Well appearing and in no acute distress. Eyes: Conjunctivae are normal.  Head: Atraumatic. Nose: No congestion/rhinnorhea. Mouth/Throat: Mucous membranes are moist.  Oropharynx non-erythematous. Neck: No stridor.   Cardiovascular: Tachycardia. Good peripheral circulation. Grossly normal heart sounds.   Respiratory: Normal respiratory effort.  No retractions. Lungs CTAB. Gastrointestinal: Soft and nontender. No distention.  Musculoskeletal: No lower extremity tenderness nor edema. No gross deformities of extremities. Neurologic:  Normal speech and language. No gross focal neurologic deficits are appreciated.  Skin:  Skin is warm, dry and intact. No rash noted. Psychiatric: Mood and affect are normal. Speech and behavior are normal.  ____________________________________________   LABS (all labs ordered are listed, but only abnormal results are displayed)  Labs Reviewed  BASIC METABOLIC PANEL - Abnormal; Notable for the following components:      Result Value   Potassium 3.3 (*)    Calcium 10.4 (*)    Anion gap 17 (*)    All other components within normal limits  CBC - Abnormal; Notable for the  following components:   WBC 11.2 (*)    RBC 6.84 (*)    Hemoglobin 18.8 (*)    HCT 54.5 (*)    All other components within normal limits  URINALYSIS, ROUTINE W REFLEX MICROSCOPIC - Abnormal; Notable for the following components:   Glucose, UA >=500 (*)    Protein, ur 30 (*)    All other components within normal limits  BASIC METABOLIC PANEL - Abnormal; Notable for the following components:   Sodium 133 (*)    Chloride 95 (*)    Glucose, Bld 449 (*)    All other components within normal limits  ACETAMINOPHEN LEVEL - Abnormal; Notable for the following components:   Acetaminophen (Tylenol), Serum <10 (*)    All other components within normal limits  CBG MONITORING, ED - Abnormal; Notable for the following components:   Glucose-Capillary  422 (*)    All other components within normal limits  SALICYLATE LEVEL  ETHANOL  CBG MONITORING, ED   ____________________________________________  RADIOLOGY  Dg Chest 2 View  Result Date: 03/22/2018 CLINICAL DATA:  Cough EXAM: CHEST - 2 VIEW COMPARISON:  07/26/2017 chest radiograph. FINDINGS: Stable cardiomediastinal silhouette with normal heart size. No pneumothorax. No pleural effusion. Lungs appear clear, with no acute consolidative airspace disease and no pulmonary edema. IMPRESSION: No active cardiopulmonary disease. Electronically Signed   By: Ilona Sorrel M.D.   On: 03/22/2018 19:48    ____________________________________________   PROCEDURES  Procedure(s) performed:   Procedures  None ____________________________________________   INITIAL IMPRESSION / ASSESSMENT AND PLAN / ED COURSE  Pertinent labs & imaging results that were available during my care of the patient were reviewed by me and considered in my medical decision making (see chart for details).  Patient presents to the emergency department with concern for DKA.  He tells me that he was seen in the emergency department yesterday for this but left [place].  He did not inform me that he was also being seen for suicidal ideation at that time.  He apparently took an intentional overdose and was awaiting for psychiatry evaluation when he left.  He was not under IVC order at that time.  His outpatient psychiatrist called me to discuss his suicidal statements made to them 2 days ago.  He also struggles with homelessness and medication noncompliance secondary to this.  The patient is without hyperglycemia here but does have an anion gap of 17.  Plan for IV fluids and Zofran.  Treatment low clinical suspicion for euglycemic DKA.  Plan to have the patient eat here and repeat basic metabolic panel afterwards.  Patient will need to be evaluated by TTS. Have also placed Social Work consult.   9:56 AM Reevaluated  the patient.  He is feeling better after IV fluids and Zofran.  Plan for repeat chemistry.  I did discuss his mental health as well.  He adamantly denies any active suicidal or homicidal ideation.  He states that he did take pills 1 week prior but nothing recently. He denies that his insulin non-compliance.  Repeat chemistry with resolved gap. Patient has developed some hyperglycemia but no DKA. Patient met with social work who has arranged for insulin coverage. Wrote Rx for Lantus, FlexPen, and strips. Patient following with community resources as well. No mental health concerns.   At this time, I do not feel there is any life-threatening condition present. I have reviewed and discussed all results (EKG, imaging, lab, urine as appropriate), exam findings with patient. I have  reviewed nursing notes and appropriate previous records.  I feel the patient is safe to be discharged home without further emergent workup. Discussed usual and customary return precautions. Patient and family (if present) verbalize understanding and are comfortable with this plan.  Patient will follow-up with their primary care provider. If they do not have a primary care provider, information for follow-up has been provided to them. All questions have been answered.   ____________________________________________  FINAL CLINICAL IMPRESSION(S) / ED DIAGNOSES  Final diagnoses:  Hyperglycemia    MEDICATIONS GIVEN DURING THIS VISIT:  Medications  sodium chloride 0.9 % bolus 1,000 mL (0 mLs Intravenous Stopped 03/22/18 2058)  ondansetron (ZOFRAN) injection 4 mg (4 mg Intravenous Given 03/22/18 1912)  insulin aspart (novoLOG) injection 8 Units (8 Units Intravenous Given 03/22/18 2251)    Note:  This document was prepared using Dragon voice recognition software and may include unintentional dictation errors.  Nanda Quinton, MD Emergency Medicine    Long, Wonda [facility], MD 03/23/18 [phone]

## 2018-03-22 NOTE — Discharge Instructions (Signed)
You were seen in the ED today with elevated blood sugar. You were provided with prescriptions for insulin and a way to have this paid for. Please follow up with the primary care clinic listed and return to the ED with any new or worsening symptoms.

## 2018-03-22 NOTE — ED Notes (Signed)
Pt back from X-ray.  

## 2018-03-22 NOTE — Care Management Note (Signed)
Case Management Note  CM consulted for no pcp no ins with medication needs.  CM left a VM for pt to return call for further information, follow up with a congregational nurse, or Lavinia Sharps, NP at the [facility].  CM will speak with pt if he returns call or comes back to the ED.  No further CM needs noted at this time.  Tamarah Bhullar, Lynnae Sandhoff, RN 03/22/2018, 9:17 AM

## 2018-03-22 NOTE — ED Triage Notes (Signed)
Pt in returning for further eventuation for DKA, seen at [facility] yesterday and they wanted to admit him but pt left AMA, has not checked his glucose today, pt diaphoretic in triage, reports constant nausea, HR 135, alert and oriented

## 2018-03-22 NOTE — ED Notes (Signed)
Pt standing at nurses station requesting to sign himself out.  Dr Jacqulyn Bath to speak to patient.

## 2018-03-22 NOTE — ED Notes (Signed)
Patient transported to X-ray 

## 2018-03-22 NOTE — Progress Notes (Addendum)
Per pt's FYI from today, pt is connected with [facility]. CSW called on call [facility] number. CSW spoke with Lorene Dy, on call clinician. On call clinician does not have update on pt's status. CSW left voicemail at [phone] [facility], [employer].  Update: CSW spoke with pt. Pt staying at [facility]. Pt does not have insurance and has not been getting insulin. CSW updated RNCM. CSW called [employer] updated that pt is here and to reserve pt's bed. [employer] aware. Pt provided with bus tickets to return to shelter.  Montine Circle, [facility]  [phone]

## 2018-03-22 NOTE — ED Notes (Signed)
Pt given sandwich and diet coke 

## 2018-04-02 ENCOUNTER — Encounter (HOSPITAL_COMMUNITY): Payer: Self-pay

## 2018-04-02 ENCOUNTER — Observation Stay (HOSPITAL_COMMUNITY)
Admission: EM | Admit: 2018-04-02 | Discharge: 2018-04-02 | Discharge status: Against Medical Advice | Payer: Medicaid Other | Attending: Internal Medicine | Admitting: Internal Medicine

## 2018-04-02 ENCOUNTER — Other Ambulatory Visit: Payer: Self-pay

## 2018-04-02 DIAGNOSIS — E1110 Type 2 diabetes mellitus with ketoacidosis without coma: Secondary | ICD-10-CM | POA: Diagnosis present

## 2018-04-02 DIAGNOSIS — Z91013 Allergy to seafood: Secondary | ICD-10-CM | POA: Insufficient documentation

## 2018-04-02 DIAGNOSIS — E1040 Type 1 diabetes mellitus with diabetic neuropathy, unspecified: Secondary | ICD-10-CM | POA: Diagnosis not present

## 2018-04-02 DIAGNOSIS — Z9119 Patient's noncompliance with other medical treatment and regimen: Secondary | ICD-10-CM | POA: Diagnosis not present

## 2018-04-02 DIAGNOSIS — J45909 Unspecified asthma, uncomplicated: Secondary | ICD-10-CM | POA: Diagnosis present

## 2018-04-02 DIAGNOSIS — F603 Borderline personality disorder: Secondary | ICD-10-CM | POA: Insufficient documentation

## 2018-04-02 DIAGNOSIS — Z590 Homelessness: Secondary | ICD-10-CM | POA: Insufficient documentation

## 2018-04-02 DIAGNOSIS — E1010 Type 1 diabetes mellitus with ketoacidosis without coma: Secondary | ICD-10-CM | POA: Diagnosis not present

## 2018-04-02 DIAGNOSIS — R011 Cardiac murmur, unspecified: Secondary | ICD-10-CM | POA: Insufficient documentation

## 2018-04-02 DIAGNOSIS — F333 Major depressive disorder, recurrent, severe with psychotic symptoms: Secondary | ICD-10-CM | POA: Insufficient documentation

## 2018-04-02 DIAGNOSIS — Z79899 Other long term (current) drug therapy: Secondary | ICD-10-CM | POA: Diagnosis not present

## 2018-04-02 DIAGNOSIS — Z794 Long term (current) use of insulin: Secondary | ICD-10-CM | POA: Diagnosis not present

## 2018-04-02 DIAGNOSIS — Z9114 Patient's other noncompliance with medication regimen: Secondary | ICD-10-CM | POA: Insufficient documentation

## 2018-04-02 DIAGNOSIS — G40909 Epilepsy, unspecified, not intractable, without status epilepticus: Secondary | ICD-10-CM | POA: Diagnosis not present

## 2018-04-02 DIAGNOSIS — E669 Obesity, unspecified: Secondary | ICD-10-CM | POA: Insufficient documentation

## 2018-04-02 DIAGNOSIS — D509 Iron deficiency anemia, unspecified: Secondary | ICD-10-CM | POA: Diagnosis not present

## 2018-04-02 DIAGNOSIS — Z8249 Family history of ischemic heart disease and other diseases of the circulatory system: Secondary | ICD-10-CM | POA: Insufficient documentation

## 2018-04-02 DIAGNOSIS — Z7951 Long term (current) use of inhaled steroids: Secondary | ICD-10-CM | POA: Insufficient documentation

## 2018-04-02 DIAGNOSIS — F509 Eating disorder, unspecified: Secondary | ICD-10-CM | POA: Insufficient documentation

## 2018-04-02 DIAGNOSIS — F913 Oppositional defiant disorder: Secondary | ICD-10-CM | POA: Diagnosis not present

## 2018-04-02 DIAGNOSIS — Z91030 Bee allergy status: Secondary | ICD-10-CM | POA: Insufficient documentation

## 2018-04-02 DIAGNOSIS — Z833 Family history of diabetes mellitus: Secondary | ICD-10-CM | POA: Diagnosis not present

## 2018-04-02 DIAGNOSIS — Z7722 Contact with and (suspected) exposure to environmental tobacco smoke (acute) (chronic): Secondary | ICD-10-CM | POA: Insufficient documentation

## 2018-04-02 DIAGNOSIS — G43909 Migraine, unspecified, not intractable, without status migrainosus: Secondary | ICD-10-CM | POA: Diagnosis not present

## 2018-04-02 DIAGNOSIS — D573 Sickle-cell trait: Secondary | ICD-10-CM | POA: Insufficient documentation

## 2018-04-02 DIAGNOSIS — F902 Attention-deficit hyperactivity disorder, combined type: Secondary | ICD-10-CM | POA: Diagnosis not present

## 2018-04-02 LAB — GLUCOSE, CAPILLARY
GLUCOSE-CAPILLARY: 212 mg/dL — AB (ref 70–99)
GLUCOSE-CAPILLARY: 171 mg/dL — AB (ref 70–99)
Glucose-Capillary: 443 mg/dL — ABNORMAL HIGH (ref 70–99)
Glucose-Capillary: 315 mg/dL — ABNORMAL HIGH (ref 70–99)
Glucose-Capillary: 538 mg/dL (ref 70–99)

## 2018-04-02 LAB — BASIC METABOLIC PANEL
Anion gap: 20 — ABNORMAL HIGH (ref 5–15)
Anion gap: 13 (ref 5–15)
BUN: 12 mg/dL (ref 6–20)
BUN: 10 mg/dL (ref 6–20)
CALCIUM: 9.2 mg/dL (ref 8.9–10.3)
CO2: 16 mmol/L — AB (ref 22–32)
CO2: 20 mmol/L — AB (ref 22–32)
CREATININE: 0.92 mg/dL (ref 0.61–1.24)
Calcium: 9.2 mg/dL (ref 8.9–10.3)
Chloride: 93 mmol/L — ABNORMAL LOW (ref 98–111)
Chloride: 101 mmol/L (ref 98–111)
Creatinine, Ser: 1.27 mg/dL — ABNORMAL HIGH (ref 0.61–1.24)
GFR calc Af Amer: >60 mL/min (ref >60)
GFR calc non Af Amer: >60 mL/min (ref >60)
GFR calc non Af Amer: >60 mL/min (ref >60)
Glucose, Bld: 665 mg/dL (ref 70–99)
Glucose, Bld: 329 mg/dL — ABNORMAL HIGH (ref 70–99)
POTASSIUM: 4 mmol/L (ref 3.5–5.1)
Potassium: 4.9 mmol/L (ref 3.5–5.1)
SODIUM: 129 mmol/L — AB (ref 135–145)
Sodium: 134 mmol/L — ABNORMAL LOW (ref 135–145)

## 2018-04-02 LAB — URINALYSIS, ROUTINE W REFLEX MICROSCOPIC
BACTERIA UA: NONE SEEN
Bilirubin Urine: NEGATIVE
Glucose, UA: >=500 mg/dL — AB
HGB URINE DIPSTICK: NEGATIVE
Ketones, ur: 80 mg/dL — AB
Leukocytes, UA: NEGATIVE
Nitrite: NEGATIVE
PH: 5 (ref 5.0–8.0)
PROTEIN: NEGATIVE mg/dL
SPECIFIC GRAVITY, URINE: 1.021 (ref 1.005–1.030)

## 2018-04-02 LAB — CBC
HCT: 46.5 % (ref 39.0–52.0)
Hemoglobin: 15.1 g/dL (ref 13.0–17.0)
MCH: 27.4 pg (ref 26.0–34.0)
MCHC: 32.5 g/dL (ref 30.0–36.0)
MCV: 84.4 fL (ref 78.0–100.0)
PLATELETS: 285 10*3/uL (ref 150–400)
RBC: 5.51 MIL/uL (ref 4.22–5.81)
RDW: 12.2 % (ref 11.5–15.5)
WBC: 9.3 10*3/uL (ref 4.0–10.5)

## 2018-04-02 LAB — CBG MONITORING, ED
Glucose-Capillary: >600 mg/dL (ref 70–99)
Glucose-Capillary: 501 mg/dL (ref 70–99)

## 2018-04-02 MED ORDER — TRAZODONE HCL 50 MG PO TABS: 100.0000 mg | ORAL_TABLET | Freq: Every evening | ORAL | Status: DC | PRN

## 2018-04-02 MED ORDER — INSULIN ASPART 100 UNIT/ML ~~LOC~~ SOLN: 0.0000 [IU] | Freq: Three times a day (TID) | SUBCUTANEOUS | Status: DC

## 2018-04-02 MED ORDER — INSULIN GLARGINE 100 UNIT/ML ~~LOC~~ SOLN
20.0000 [IU] | Freq: Every day | SUBCUTANEOUS | Status: DC
  Administered 2018-04-02: 20 [IU] via SUBCUTANEOUS
  Filled 2018-04-02: qty 0.2

## 2018-04-02 MED ORDER — SODIUM CHLORIDE 0.9 % IV BOLUS
1000.0000 mL | Freq: Once | INTRAVENOUS | Status: AC
  Administered 2018-04-02: 1000 mL via INTRAVENOUS

## 2018-04-02 MED ORDER — INSULIN ASPART 100 UNIT/ML ~~LOC~~ SOLN: 0.0000 [IU] | Freq: Every day | SUBCUTANEOUS | Status: DC

## 2018-04-02 MED ORDER — DEXTROSE 50 % IV SOLN: 25.0000 mL | INTRAVENOUS | Status: DC | PRN

## 2018-04-02 MED ORDER — HEPARIN SODIUM (PORCINE) 5000 UNIT/ML IJ SOLN
5000.0000 [IU] | Freq: Three times a day (TID) | INTRAMUSCULAR | Status: DC
  Administered 2018-04-02: 5000 [IU] via SUBCUTANEOUS
  Filled 2018-04-02 (×2): qty 1

## 2018-04-02 MED ORDER — ALBUTEROL SULFATE (2.5 MG/3ML) 0.083% IN NEBU: 2.5000 mg | INHALATION_SOLUTION | RESPIRATORY_TRACT | Status: DC | PRN

## 2018-04-02 MED ORDER — NICOTINE 21 MG/24HR TD PT24
21.0000 mg | MEDICATED_PATCH | Freq: Every day | TRANSDERMAL | Status: DC
  Administered 2018-04-02: 21 mg via TRANSDERMAL
  Filled 2018-04-02: qty 1

## 2018-04-02 MED ORDER — SODIUM CHLORIDE 0.9 % IV SOLN
INTRAVENOUS | Status: DC
  Administered 2018-04-02: 14:00:00 via INTRAVENOUS

## 2018-04-02 MED ORDER — SODIUM CHLORIDE 0.9 % IV SOLN
INTRAVENOUS | Status: DC
  Administered 2018-04-02: 20:00:00 via INTRAVENOUS

## 2018-04-02 MED ORDER — SODIUM CHLORIDE 0.9 % IV SOLN
INTRAVENOUS | Status: DC
  Administered 2018-04-02: 4.4 [IU]/h via INTRAVENOUS
  Filled 2018-04-02: qty 1

## 2018-04-02 MED ORDER — DEXTROSE-NACL 5-0.45 % IV SOLN
INTRAVENOUS | Status: DC
  Administered 2018-04-02: 17:00:00 via INTRAVENOUS

## 2018-04-02 MED ORDER — FLUOXETINE HCL 20 MG PO CAPS
20.0000 mg | ORAL_CAPSULE | Freq: Every day | ORAL | Status: DC
  Administered 2018-04-02: 20 mg via ORAL
  Filled 2018-04-02 (×2): qty 1

## 2018-04-02 MED ORDER — DEXTROSE-NACL 5-0.45 % IV SOLN: INTRAVENOUS | Status: DC

## 2018-04-02 MED ORDER — SODIUM CHLORIDE 0.9 % IV SOLN
INTRAVENOUS | Status: DC
  Filled 2018-04-02: qty 1

## 2018-04-02 MED ORDER — INSULIN REGULAR BOLUS VIA INFUSION
0.0000 [IU] | Freq: Three times a day (TID) | INTRAVENOUS | Status: DC
  Filled 2018-04-02: qty 10

## 2018-04-02 NOTE — Clinical Social Work Note (Signed)
Clinical Social Work Assessment  Patient Details  Name: [name] [name]. MRN: [id number] Date of Birth: Aug 11, 2000  Date of referral:  04/02/18               Reason for consult:  Other (Comment Required), Housing Concerns/Homelessness(assitin with medication. )                Permission sought to share information with:  Other Permission granted to share information::  Yes, Verbal Permission Granted  Name::     [facility] and Karilyn Cota (pt's Engineer, manufacturing systems)  Agency::  Care Team   Relationship::  Care Team   Contact Information:  Judene Companion [place] [phone] and Alen Blew [phone]  Housing/Transportation Living arrangements for the past 2 months:  Homeless Source of Information:  Patient Patient Interpreter Needed:  None Criminal Activity/Legal Involvement Pertinent to Current Situation/Hospitalization:  Yes Significant Relationships:  Warehouse manager, Friend Lives with:  Self Do you feel safe going back to the place where you live?  Yes Need for family participation in patient care:  Yes (Comment)  Care giving concerns: CSW consulted as pt is homeless and needing assistance with medications. CSW made aware that pt has been seen in the ED multiple times however pt makes the decision to leave AMA.    Social Worker assessment / plan:  CSW spoke with pt at bedside. CSW was advised that pt is homeless and has been living at the shelter. CSW was updated that pt was recently released from jail and that pt was suppose to have an ankle monitor placed today. CSW was asked to reach out to pt's Statistician as well as Engineer, manufacturing systems for pt. CSW spoke with Vidal Schwalbe with [facility] and was informed that Venetia Constable has been trying to work with pt to gt medication and housing needs met-however pt has expressed no desire to take medications needed (insulin). CSW advised that [facility] staff is to have a meeting on Monday at 2pm to discuss further  needs of pt.   Per Venetia Constable pt has been assessed at [facility] for suicidal ideation and homicidal ideation in the past.   Employment status:  Unemployed Insurance information:  Self Pay (Medicaid Pending) PT Recommendations:  Not assessed at this time Information / Referral to community resources:  LME (Local Management Entity), Law Enforcement  Patient/Family's Response to care:  Pt's response to care appeared to be somewhat understanding as CSW attempted to explain the services that [facility] could offer pt if pt chose to have the services.  Patient/Family's Understanding of and Emotional Response to Diagnosis, Current Treatment, and Prognosis:  No further questions or concerns have been presented to CSW at this time.   Emotional Assessment Appearance:  Appears stated age Attitude/Demeanor/Rapport:  Gracious, Engaged Affect (typically observed):  Accepting, Adaptable, Appropriate, Pleasant Orientation:  Oriented to Situation, Oriented to Self, Oriented to Place, Oriented to  Time Alcohol / Substance use:  Not Applicable Psych involvement (Current and /or in the community):  No (Comment)(not at this time. )  Discharge Needs  Concerns to be addressed:  Legal Concerns, Medication Concerns, Homelessness, Basic Needs Readmission within the last 30 days:  Yes(however pt has signe dout AMA in the past. ) Current discharge risk:  Lack of support system, Homeless, Legal Concerns, None(homeless with medical needs. ) Barriers to Discharge:  Continued Medical Work up   [employer], [place] 04/02/2018, 12:07 PM

## 2018-04-02 NOTE — ED Notes (Signed)
Pt ambulated to room, tolerated well.

## 2018-04-02 NOTE — ED Provider Notes (Addendum)
[facility] EMERGENCY DEPARTMENT Provider Note   CSN: [id number] Arrival date & time: 04/02/18  0911     History   Chief Complaint Chief Complaint  Patient presents with  . Hyperglycemia    HPI [name] [name]. is a 17 y.o. male.  Patient with history of insulin-dependent diabetes presents with elevated blood sugars.  Patient states that he has been urinating very frequently over the past several days and did not feel well last night.  He is currently homeless and living at [facility].  He checked his sugar yesterday and it read high.  He states that he walked here this morning to "burn off sugar".  He reports one episode of vomiting at approximately 2 AM.  He has had some mild generalized abdominal pain.  No fevers, chest pain, cough.  He has had some red bumps on his skin that have been scattered and are itchy.  Reports increased urination but no burning with urination.  States that he has not been eating well recently.  He has been out of his insulin for a couple of weeks.  Reports recent 1 to 2-week stay in jail where he was not given his insulin.  Typically on a sliding scale.  He states he has been modifying his carbs.     Past Medical History:  Diagnosis Date  . ADD (attention deficit disorder)   . Allergy   . Anxiety   . Asthma   . Depression   . Epileptic seizure (HCC)    "both petit and grand mal; last sz ~ 2 wk ago" (06/17/2013)  . Headache(784.0)    "2-3 times/wk usually" (06/17/2013)  . Heart murmur    "heard a slight one earlier today" (06/17/2013)  . Migraine    "maybe once/month; it's severe" (06/17/2013)  . ODD (oppositional defiant disorder)   . Sickle cell trait (HCC)   . Type I diabetes mellitus (HCC)    "that's what they're thinking now" (06/17/2013)  . Vision abnormalities    "takes him longer to focus cause; from his sz" (06/17/2013)    Patient Active Problem List   Diagnosis Date Noted  . Borderline personality  disorder (HCC) 02/26/2018  . 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 DKA, type 1 (HCC) 07/16/2017  . Nausea & vomiting 07/16/2017  . Uncontrolled type 1 diabetes circulatory disorder erectile dysfunction (HCC)   . DKA (diabetic ketoacidoses) (HCC) 12/18/2016  . History of seizures 12/01/2016  . History of migraine 12/01/2016  . Disordered eating 03/08/2015  . Ketonuria   . Adjustment reaction to medical therapy   . Non compliance w medication regimen   . 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 Dehydration 08/22/2014  . Hyperglycemia due to type 1 diabetes mellitus (HCC) 08/21/2014  . Type 1 diabetes mellitus with hyperglycemia (HCC)   . Noncompliance   . Generalized abdominal pain   . Glycosuria   . Depression 07/12/2014  . Involuntary movements 06/13/2014  . Bilateral leg pain 06/13/2014  . 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 Suicidal ideation 03/31/2014  . ODD (oppositional defiant disorder) 03/03/2014  . MDD (major depressive disorder), recurrent episode, moderate (HCC) 02/16/2014  . Attention deficit hyperactivity disorder (ADHD), combined type, moderate 02/16/2014  . Microcytic anemia 02/15/2014  . Hyperglycemia 02/14/2014  . Goiter 02/04/2014  .  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 Acquired acanthosis nigricans 02/04/2014  . Obesity, morbid (HCC) 02/04/2014  . Insulin resistance 02/04/2014  . Hyperinsulinemia 02/04/2014  . Hypoglycemia associated with diabetes (HCC) 02/02/2014  . Maladaptive health behaviors affecting medical condition 02/02/2014  . Hypoglycemia 02/02/2014  . Type 1 diabetes mellitus in patient 49 to 19 years of age with hemoglobin A1c goal of less than 7.5% (HCC)  01/26/2014  . 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  . 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 Migraine without aura 12/13/2013  . Body mass index, pediatric, greater than or equal to 95th percentile for age 73/16/2015  . Asthma 12/13/2013  . Hypoglycemia unawareness in type 1 diabetes mellitus (HCC) 08/08/2013  . Seizure disorder (HCC) 07/06/2013  . Short-term memory loss 07/06/2013  . Sickle cell trait (HCC) 07/06/2013  . Diabetes (HCC) 06/17/2013  . Diabetes mellitus, new onset (HCC) 06/17/2013    Past Surgical History:  Procedure Laterality Date  . CIRCUMCISION  2000  . FINGER SURGERY Left 2001   "crushed pinky; had to repair it" (06/17/2013)        Home Medications    Prior to Admission medications   Medication Sig Start Date End Date Taking? Authorizing Provider  albuterol (PROVENTIL HFA;VENTOLIN HFA) 108 (90 Base) MCG/ACT inhaler Inhale 2 puffs into the lungs every 6 (six) hours as needed for wheezing or shortness of breath. Patient not taking: Reported on 03/21/2018 03/09/18   Armandina Stammer I, NP  chlorproMAZINE (THORAZINE) 100 MG tablet Take 1 tablet (100 mg total) by mouth at bedtime. For mood control Patient taking differently: Take 100 mg by mouth daily.  03/09/18   Armandina Stammer I, NP  EPINEPHrine 0.3 mg/0.3 mL IJ SOAJ injection Inject 0.3 mLs (0.3 mg total) into the muscle as needed (PRN for anaphylaxis). Patient not taking: Reported on 03/21/2018 03/09/18   Armandina Stammer I, NP  FLUoxetine (PROZAC) 20 MG capsule Take 1 capsule (20 mg total) by mouth daily. For depression 03/10/18   Armandina Stammer I, NP  glucagon (GLUCAGON EMERGENCY) 1 MG injection Inject 1 mg into the muscle once as needed (severe hypoglycemia - if unresponsive, unable to swallow, unconscious and/or has seizure). Patient not taking: Reported on 03/21/2018 03/09/18   Armandina Stammer I, NP  glucose blood (ACCU-CHEK GUIDE) test strip Check glucose 6x daily: For blood sugar  checks 03/22/18   Long, Arlyss Repress, MD  hydrOXYzine (ATARAX/VISTARIL) 50 MG tablet Take 1 tablet (50 mg total) by mouth every 6 (six) hours as needed for anxiety. Patient not taking: Reported on 03/21/2018 03/09/18   Armandina Stammer I, NP  insulin aspart (NOVOLOG FLEXPEN) 100 UNIT/ML FlexPen Inject 0-15 Units into the skin 3 (three) times daily with meals. (Sliding scale): For diabetes management Patient not taking: Reported on 03/21/2018 03/09/18   Armandina Stammer I, NP  insulin aspart (NOVOLOG) 100 UNIT/ML FlexPen Inject 0-15 Units into the skin 3 (three) times daily with meals. (Sliding scale): For diabetes management 03/22/18   Long, Arlyss Repress, MD  insulin glargine (LANTUS) 100 UNIT/ML injection Inject 0.35 mLs (35 Units total) into the skin 2 (two) times daily. 03/21/18   Joy, Shawn C, PA-C  insulin glargine (LANTUS) 100 UNIT/ML injection Inject 0.35 mLs (35 Units total) into the skin 2 (two) times daily. For diabetes management 03/22/18   Long, Arlyss Repress, MD  nicotine (NICODERM CQ - DOSED IN MG/24 HOURS) 21 mg/24hr patch Place 1 patch (21 mg total) onto the  skin daily. (May purchase from over the counter): For smoking cessation Patient not taking: Reported on 03/21/2018 03/10/18   Armandina Stammer I, NP  traZODone (DESYREL) 100 MG tablet Take 1 tablet (100 mg total) by mouth at bedtime as needed for sleep. 03/09/18   Sanjuana Kava, NP    Family History Family History  Problem Relation Age of Onset  . Asthma Mother   . COPD Mother   . Other Mother        possible autoimmune, unclear  . Diabetes Maternal Grandmother   . Heart disease Maternal Grandmother   . Hypertension Maternal Grandmother   . Mental illness Maternal Grandmother   . Heart disease Maternal Grandfather   . Hyperlipidemia Paternal Grandmother   . Hyperlipidemia Paternal Grandfather   . Migraines Sister        Hemiplegic Migraines     Social History Social History   Tobacco Use  . Smoking status: Passive Smoke Exposure - Never Smoker    . Smokeless tobacco: Never Used  . Tobacco comment: Mom and dad smoke outside   Substance Use Topics  . Alcohol use: No    Alcohol/week: 0.0 standard drinks  . Drug use: No     Allergies   Bee venom; Fish allergy; and Shellfish allergy   Review of Systems Review of Systems  Constitutional: Negative for fever.  HENT: Negative for rhinorrhea and sore throat.   Eyes: Negative for redness.  Respiratory: Negative for cough.   Cardiovascular: Negative for chest pain.  Gastrointestinal: Positive for abdominal pain, nausea and vomiting. Negative for diarrhea.  Endocrine: Positive for polydipsia and polyuria.  Genitourinary: Negative for dysuria.  Musculoskeletal: Negative for myalgias.  Skin: Negative for rash.  Neurological: Negative for headaches.     Physical Exam Updated Vital Signs BP 137/83 (BP Location: Right Arm)   Pulse (!) 105   Temp 98.3 F (36.8 C) (Oral)   Resp 16   SpO2 99%   Physical Exam  Constitutional: He appears well-developed and well-nourished.  HENT:  Head: Normocephalic and atraumatic.  Mouth/Throat: No oropharyngeal exudate.  Dry mucous membranes.  Eyes: Conjunctivae are normal. Right eye exhibits no discharge. Left eye exhibits no discharge.  Neck: Normal range of motion. Neck supple.  Cardiovascular: Regular rhythm and normal heart sounds. Tachycardia present.  Mild tachycardia  Pulmonary/Chest: Effort normal and breath sounds normal.  Abdominal: Soft. There is no tenderness.  Neurological: He is alert.  Skin: Skin is warm and dry.  Psychiatric: He has a normal mood and affect.  Nursing note and vitals reviewed.    ED Treatments / Results  Labs (all labs ordered are listed, but only abnormal results are displayed) Labs Reviewed  BASIC METABOLIC PANEL - Abnormal; Notable for the following components:      Result Value   Sodium 129 (*)    Chloride 93 (*)    CO2 16 (*)    Glucose, Bld 665 (*)    Creatinine, Ser 1.27 (*)    Anion gap  20 (*)    All other components within normal limits  URINALYSIS, ROUTINE W REFLEX MICROSCOPIC - Abnormal; Notable for the following components:   Color, Urine COLORLESS (*)    Glucose, UA >=500 (*)    Ketones, ur 80 (*)    All other components within normal limits  CBG MONITORING, ED - Abnormal; Notable for the following components:   Glucose-Capillary >600 (*)    All other components within normal limits  CBC  EKG None  Radiology No results found.  Procedures Procedures (including critical care time)  Medications Ordered in ED Medications  dextrose 5 %-0.45 % sodium chloride infusion (has no administration in time range)  insulin regular (NOVOLIN R,HUMULIN R) 100 Units in sodium chloride 0.9 % 100 mL (1 Units/mL) infusion (has no administration in time range)  sodium chloride 0.9 % bolus 1,000 mL (1,000 mLs Intravenous New Bag/Given 04/02/18 1031)  sodium chloride 0.9 % bolus 1,000 mL (1,000 mLs Intravenous New Bag/Given 04/02/18 1030)     Initial Impression / Assessment and Plan / ED Course  I have reviewed the triage vital signs and the nursing notes.  Pertinent labs & imaging results that were available during my care of the patient were reviewed by me and considered in my medical decision making (see chart for details).     Patient seen and examined. Work-up initiated. Fluids ordered. Pt looks very well. Awaiting BMP to assess for anion gap.   Vital signs reviewed and are as follows: BP 137/83 (BP Location: Right Arm)   Pulse (!) 105   Temp 98.3 F (36.8 C) (Oral)   Resp 16   SpO2 99%   11:16 AM patient reassessed.  He is receiving fluids.  His exam is unchanged.  Discussed with Dr. Rodena Medin.  Will admit to hospital for mild DKA.  Patient is agreeable.  Potassium is normal.  Will start on glucose stabilizer protocol.  I spoke with Dr. Randol Kern who will see patient.   CRITICAL CARE Performed by: Renne Crigler PA-C Total critical care time: 33  minutes Critical care time was exclusive of separately billable procedures and treating other patients. Critical care was necessary to treat or prevent imminent or life-threatening deterioration. Critical care was time spent personally by me on the following activities: development of treatment plan with patient and/or surrogate as well as nursing, discussions with consultants, evaluation of patient's response to treatment, examination of patient, obtaining history from patient or surrogate, ordering and performing treatments and interventions, ordering and review of laboratory studies, ordering and review of radiographic studies, pulse oximetry and re-evaluation of patient's condition.   Final Clinical Impressions(s) / ED Diagnoses   Final diagnoses:  Diabetic ketoacidosis without coma associated with type 1 diabetes mellitus (HCC)   Patient with mild DKA, blood sugar of 665 and an anion gap of 20 with 80 ketones in the urine.  He will require admission for IV insulin and hydration.  Underlying issue is medication noncompliance due to homelessness, imprisonment.  ED Discharge Orders    None        Renne Crigler, Cordelia Poche 04/02/18 1119    Wynetta Fines, MD 04/03/18 1153

## 2018-04-02 NOTE — ED Triage Notes (Signed)
Pt presents for evaluation of abd pain and hyperglycemia. Has not had insulin since last admission here.

## 2018-04-02 NOTE — Progress Notes (Signed)
Inpatient Diabetes Program Recommendations  AACE/ADA: New Consensus Statement on Inpatient Glycemic Control (2015)  Target Ranges:  Prepandial:   less than 140 mg/dL      Peak postprandial:   less than 180 mg/dL (1-2 hours)      Critically ill patients:  140 - 180 mg/dL   Review of Glycemic Control  Diabetes history: DM 1 Outpatient Diabetes medications: not taking but dosed with Lantus 35 units BID, Novolog 0-15 units with meals Current orders for Inpatient glycemic control: IV insulin DKA protocol  Inpatient Diabetes Program Recommendations:    Homeless not taking insulin since last admission May need vial of 70/30 at time of d/c and give patient vial from hospital use. along with syringes  70/30 35 units BID would be the equivalent of 49 units of basal insulin and 21 units of short acting insulin for meals and coverage.  Thanks,  Christena Deem RN, MSN, BC-ADM Inpatient Diabetes Coordinator Team Pager [phone] (8a-5p)

## 2018-04-02 NOTE — H&P (Addendum)
TRH H&P   Patient Demographics:    [name] [name], is a 17 y.o. male  MRN: [id number]   DOB - [birth year]  Admit Date - 04/02/2018  Outpatient Primary MD for the patient is System, Pcp Not In  Referring MD/NP/PA: Geiple  Patient coming from: Shelter  Chief Complaint  Patient presents with  . Hyperglycemia      HPI:    [name] [name]  is a 17 y.o. male, with past medical history of type 1 diabetes mellitus, depression, who presents to ED secondary to weakness, nausea and vomiting and abdominal pain, report he did not receive any of his insulin for last 10 days, report he was in jail for last week, did not receive any of his insulin, discharged for 2 days, he was in shelter, he did not have any insulin, he started to feel weak, fatigued, with nausea and vomiting and poor appetite over last 24 hours, with some abdominal pain which prompted him to come to ED. denies any fever, chills, chest pain, shortness of breath, dysuria or polyuria -in ED patient was noted to be in mild DKA, with anion gap of 20, blood glucose of 665, AKI with a creatinine of 1.27, and bicarb of 16, he was started on insulin drip, received fluid bolus and I was called to admit.     Review of systems:    In addition to the HPI above,  No Fever-chills, reports some weakness and fatigue No Headache, No changes with Vision or hearing, No problems swallowing food or Liquids, No Chest pain, Cough or Shortness of Breath, Ports abdominal pain, nausea, vomiting, Bowel movements are regular, No Blood in stool or Urine, No dysuria, No new skin rashes or bruises, No new joints pains-aches,  No new weakness, tingling, numbness in any extremity, No recent weight gain or loss, No polyuria, polydypsia or polyphagia, No significant Mental Stressors.  A full 10 point Review of Systems was done, except as stated above, all  other Review of Systems were negative.   With Past History of the following :    Past Medical History:  Diagnosis Date  . ADD (attention deficit disorder)   . Allergy   . Anxiety   . Asthma   . Depression   . Epileptic seizure (HCC)    "both petit and grand mal; last sz ~ 2 wk ago" (06/17/2013)  . Headache(784.0)    "2-3 times/wk usually" (06/17/2013)  . Heart murmur    "heard a slight one earlier today" (06/17/2013)  . Migraine    "maybe once/month; it's severe" (06/17/2013)  . ODD (oppositional defiant disorder)   . Sickle cell trait (HCC)   . Type I diabetes mellitus (HCC)    "that's what they're thinking now" (06/17/2013)  . Vision abnormalities    "takes him longer to focus cause; from his sz" (06/17/2013)      Past Surgical History:  Procedure Laterality Date  . CIRCUMCISION  2000  . FINGER SURGERY Left 2001   "crushed pinky; had to repair it" (06/17/2013)      Social History:     Social History   Tobacco Use  . Smoking status: Passive Smoke Exposure - Never Smoker  . Smokeless tobacco: Never Used  . Tobacco comment: Mom and dad smoke outside   Substance Use Topics  . Alcohol use: No    Alcohol/week: 0.0 standard drinks     Lives -shelter  Mobility -independent     Family History :     Family History  Problem Relation Age of Onset  . Asthma Mother   . COPD Mother   . Other Mother        possible autoimmune, unclear  . Diabetes Maternal Grandmother   . Heart disease Maternal Grandmother   . Hypertension Maternal Grandmother   . Mental illness Maternal Grandmother   . Heart disease Maternal Grandfather   . Hyperlipidemia Paternal Grandmother   . Hyperlipidemia Paternal Grandfather   . Migraines Sister        Hemiplegic Migraines      Home Medications:   Prior to Admission medications   Medication Sig Start Date End Date Taking? Authorizing Provider  chlorproMAZINE (THORAZINE) 100 MG tablet Take 1 tablet (100 mg total) by mouth at  bedtime. For mood control Patient taking differently: Take 100 mg by mouth daily.  03/09/18  Yes Armandina Stammer I, NP  FLUoxetine (PROZAC) 20 MG capsule Take 1 capsule (20 mg total) by mouth daily. For depression 03/10/18  Yes Nwoko, Nicole Kindred I, NP  Fluticasone Propionate HFA (FLOVENT HFA IN) Inhale 2 puffs into the lungs daily.   Yes [provider]  hydrOXYzine (ATARAX/VISTARIL) 50 MG tablet Take 1 tablet (50 mg total) by mouth every 6 (six) hours as needed for anxiety. 03/09/18  Yes Nwoko, Nicole Kindred I, NP  insulin aspart (NOVOLOG FLEXPEN) 100 UNIT/ML FlexPen Inject 0-15 Units into the skin 3 (three) times daily with meals. (Sliding scale): For diabetes management 03/09/18  Yes Armandina Stammer I, NP  insulin glargine (LANTUS) 100 UNIT/ML injection Inject 0.35 mLs (35 Units total) into the skin 2 (two) times daily. 03/21/18  Yes Joy, Shawn C, PA-C  nicotine (NICODERM CQ - DOSED IN MG/24 HOURS) 21 mg/24hr patch Place 1 patch (21 mg total) onto the skin daily. (May purchase from over the counter): For smoking cessation 03/10/18  Yes Armandina Stammer I, NP  traZODone (DESYREL) 100 MG tablet Take 1 tablet (100 mg total) by mouth at bedtime as needed for sleep. 03/09/18  Yes Armandina Stammer I, NP  albuterol (PROVENTIL HFA;VENTOLIN HFA) 108 (90 Base) MCG/ACT inhaler Inhale 2 puffs into the lungs every 6 (six) hours as needed for wheezing or shortness of breath. Patient not taking: Reported on 04/02/2018 03/09/18   Armandina Stammer I, NP  glucose blood (ACCU-CHEK GUIDE) test strip Check glucose 6x daily: For blood sugar checks 03/22/18   Long, Arlyss Repress, MD     Allergies:     Allergies  Allergen Reactions  . Bee Venom Anaphylaxis  . Fish Allergy Anaphylaxis  . Shellfish Allergy Anaphylaxis     Physical Exam:   Vitals  Blood pressure 135/83, pulse 93, temperature 98.3 F (36.8 C), temperature source Oral, resp. rate 16, SpO2 98 %.   1. General developed young male laying in bed in no apparent distress  2. Normal  affect and insight, Not Suicidal or Homicidal, Awake Alert, Oriented X 3.  3. No F.N deficits, ALL C.Nerves Intact, Strength 5/5 all 4 extremities, Sensation intact all 4 extremities, Plantars down going.  4. Ears and Eyes appear Normal, Conjunctivae clear, PERRLA.  Dry oral Mucosa.  5. Supple Neck, No JVD, No cervical lymphadenopathy appriciated, No Carotid Bruits.  6. Symmetrical Chest wall movement, Good air movement bilaterally, CTAB.  7. RRR, No Gallops, Rubs or Murmurs, No Parasternal Heave.  8. Positive Bowel Sounds, Abdomen Soft, No tenderness, No organomegaly appriciated,No rebound -guarding or rigidity.  9.  No Cyanosis, Normal Skin Turgor, No Skin Rash or Bruise.  10. Good muscle tone,  joints appear normal , no effusions, Normal ROM.  11. No Palpable Lymph Nodes in Neck or Axillae     Data Review:    CBC Recent Labs  Lab 04/02/18 0920  WBC 9.3  HGB 15.1  HCT 46.5  PLT 285  MCV 84.4  MCH 27.4  MCHC 32.5  RDW 12.2   ------------------------------------------------------------------------------------------------------------------  Chemistries  Recent Labs  Lab 04/02/18 0920  NA 129*  K 4.9  CL 93*  CO2 16*  GLUCOSE 665*  BUN 12  CREATININE 1.27*  CALCIUM 9.2   ------------------------------------------------------------------------------------------------------------------ estimated creatinine clearance is 111.7 mL/min (A) (by C-G formula based on SCr of 1.27 mg/dL (H)). ------------------------------------------------------------------------------------------------------------------ No results for input(s): TSH, T4TOTAL, T3FREE, THYROIDAB in the last 72 hours.  Invalid input(s): FREET3  Coagulation profile No results for input(s): INR, PROTIME in the last 168 hours. ------------------------------------------------------------------------------------------------------------------- No results for input(s): DDIMER in the last 72  hours. -------------------------------------------------------------------------------------------------------------------  Cardiac Enzymes No results for input(s): CKMB, TROPONINI, MYOGLOBIN in the last 168 hours.  Invalid input(s): CK ------------------------------------------------------------------------------------------------------------------ No results found for: BNP   ---------------------------------------------------------------------------------------------------------------  Urinalysis    Component Value Date/Time   COLORURINE COLORLESS (A) 04/02/2018 0916   APPEARANCEUR CLEAR 04/02/2018 0916   LABSPEC 1.021 04/02/2018 0916   PHURINE 5.0 04/02/2018 0916   GLUCOSEU >=500 (A) 04/02/2018 0916   HGBUR NEGATIVE 04/02/2018 0916   BILIRUBINUR NEGATIVE 04/02/2018 0916   KETONESUR 80 (A) 04/02/2018 0916   PROTEINUR NEGATIVE 04/02/2018 0916   UROBILINOGEN 0.2 03/13/2015 0123   NITRITE NEGATIVE 04/02/2018 0916   LEUKOCYTESUR NEGATIVE 04/02/2018 0916    ----------------------------------------------------------------------------------------------------------------   Imaging Results:    No results found.     Assessment & Plan:    Active Problems:   Asthma   DKA (diabetic ketoacidoses) (HCC)   DKA, mild -Secondary to noncompliance with medication, as he reported he was not receiving any in jail as he only had 70/30 and he was not prescribed to take it, not have any more insulin in the shelter, anion gap is 20, bicarb is 16, glucose is 665, admitted under DKA protocol, continue with insulin drip, monitor labs closely, continue with IV fluids, he is on Lantus 35 units twice daily at baseline, with NovoLog sliding scale, 1 anion gap closed, will start him on diet, give him 25 to 30 units of Lantus and started him on insulin sliding scale.  Noncompliance/living in a shelter -Social worker consulted to see if they can assist with medication on discharge and [facility].  Depression -Continue with home meds  Tobacco abuse -Counseled, continue with nicotine patch  Asthma -Active wheezing, appears stable, continue with PRN nebs  DVT Prophylaxis Heparin -   SCDs   AM Labs Ordered, also please review Full Orders  Family Communication: Admission, patients condition and plan of care including tests being ordered have been discussed with the patient and  brother  who indicate understanding and agree with the plan and Code Status.  Code Status Full  Likely DC to  shelter  Condition GUARDED    Consults called: none  Admission status: obsevation  Time spent in minutes : 55 minutes   Huey Bienenstock M.D on 04/02/2018 at 12:23 PM  Between 7am to 7pm - Pager - [phone]. After 7pm go to [email or web address] - password [facility]  [facility] - Office  [phone]

## 2018-04-02 NOTE — Progress Notes (Signed)
CSW spoke with pt at bedside where CSW received verbal permission to speak with [facility] staff regarding pt. CSW spoke with Mordecai Rasmussen with [place]. CSW updated her that pt was in the ED and that pt has requested that someone reach out to pt's Probation Officer regarding ankle monitor. CSW was advised that Dahlia Client would follow up with Viann Fish [phone] to give update on pt's whereabouts at this time. CSW will continue to follow for further needs.    Claude Manges Williemae Muriel, MSW, LCSW-A Emergency Department Clinical Social Worker [phone]

## 2018-04-03 NOTE — Discharge Summary (Signed)
Apparently patient left AMA overnight, by reviewing nurses note, he left  AMA around 9 PM, with his friends, he was awake alert and aware of risks of leaving AMA.  Please review H&P dictated 04/02/2018.  Patient was admitted mild DKA, he was admitted for heparin GTT, and IV fluids, apparently his anion gap closed, and he was transitioned to subcu insulin regimen, which has been titrated for hyperglycemia control, and he decided to leave AMA.  [name] Bienenstock MD

## 2018-04-04 ENCOUNTER — Inpatient Hospital Stay (HOSPITAL_COMMUNITY)
Admission: EM | Admit: 2018-04-04 | Discharge: 2018-04-05 | DRG: 639 | Discharge status: Against Medical Advice | Payer: Medicaid Other | Attending: Internal Medicine | Admitting: Internal Medicine

## 2018-04-04 ENCOUNTER — Encounter (HOSPITAL_COMMUNITY): Payer: Self-pay | Admitting: Emergency Medicine

## 2018-04-04 DIAGNOSIS — Z9119 Patient's noncompliance with other medical treatment and regimen: Secondary | ICD-10-CM | POA: Diagnosis not present

## 2018-04-04 DIAGNOSIS — F1410 Cocaine abuse, uncomplicated: Secondary | ICD-10-CM | POA: Diagnosis present

## 2018-04-04 DIAGNOSIS — Z794 Long term (current) use of insulin: Secondary | ICD-10-CM

## 2018-04-04 DIAGNOSIS — Z825 Family history of asthma and other chronic lower respiratory diseases: Secondary | ICD-10-CM | POA: Diagnosis not present

## 2018-04-04 DIAGNOSIS — Z91199 Patient's noncompliance with other medical treatment and regimen due to unspecified reason: Secondary | ICD-10-CM

## 2018-04-04 DIAGNOSIS — G629 Polyneuropathy, unspecified: Secondary | ICD-10-CM | POA: Diagnosis present

## 2018-04-04 DIAGNOSIS — E1110 Type 2 diabetes mellitus with ketoacidosis without coma: Secondary | ICD-10-CM | POA: Diagnosis present

## 2018-04-04 DIAGNOSIS — Z91013 Allergy to seafood: Secondary | ICD-10-CM | POA: Diagnosis not present

## 2018-04-04 DIAGNOSIS — IMO0002 Reserved for concepts with insufficient information to code with codable children: Secondary | ICD-10-CM | POA: Diagnosis present

## 2018-04-04 DIAGNOSIS — R531 Weakness: Secondary | ICD-10-CM | POA: Diagnosis present

## 2018-04-04 DIAGNOSIS — Z79899 Other long term (current) drug therapy: Secondary | ICD-10-CM | POA: Diagnosis not present

## 2018-04-04 DIAGNOSIS — E1065 Type 1 diabetes mellitus with hyperglycemia: Secondary | ICD-10-CM

## 2018-04-04 DIAGNOSIS — F603 Borderline personality disorder: Secondary | ICD-10-CM | POA: Diagnosis present

## 2018-04-04 DIAGNOSIS — F329 Major depressive disorder, single episode, unspecified: Secondary | ICD-10-CM | POA: Diagnosis present

## 2018-04-04 DIAGNOSIS — Z91030 Bee allergy status: Secondary | ICD-10-CM

## 2018-04-04 DIAGNOSIS — E1010 Type 1 diabetes mellitus with ketoacidosis without coma: Secondary | ICD-10-CM | POA: Diagnosis not present

## 2018-04-04 DIAGNOSIS — E876 Hypokalemia: Secondary | ICD-10-CM | POA: Diagnosis not present

## 2018-04-04 DIAGNOSIS — F419 Anxiety disorder, unspecified: Secondary | ICD-10-CM | POA: Diagnosis present

## 2018-04-04 DIAGNOSIS — F1510 Other stimulant abuse, uncomplicated: Secondary | ICD-10-CM | POA: Diagnosis present

## 2018-04-04 DIAGNOSIS — E1059 Type 1 diabetes mellitus with other circulatory complications: Secondary | ICD-10-CM | POA: Diagnosis present

## 2018-04-04 DIAGNOSIS — F54 Psychological and behavioral factors associated with disorders or diseases classified elsewhere: Secondary | ICD-10-CM | POA: Diagnosis present

## 2018-04-04 DIAGNOSIS — E1042 Type 1 diabetes mellitus with diabetic polyneuropathy: Secondary | ICD-10-CM | POA: Diagnosis present

## 2018-04-04 DIAGNOSIS — N521 Erectile dysfunction due to diseases classified elsewhere: Secondary | ICD-10-CM

## 2018-04-04 DIAGNOSIS — D573 Sickle-cell trait: Secondary | ICD-10-CM | POA: Diagnosis present

## 2018-04-04 DIAGNOSIS — G40909 Epilepsy, unspecified, not intractable, without status epilepticus: Secondary | ICD-10-CM

## 2018-04-04 DIAGNOSIS — R451 Restlessness and agitation: Secondary | ICD-10-CM

## 2018-04-04 DIAGNOSIS — F321 Major depressive disorder, single episode, moderate: Secondary | ICD-10-CM

## 2018-04-04 DIAGNOSIS — E1310 Other specified diabetes mellitus with ketoacidosis without coma: Secondary | ICD-10-CM

## 2018-04-04 LAB — BASIC METABOLIC PANEL
ANION GAP: 25 — AB (ref 5–15)
Anion gap: 15 (ref 5–15)
BUN: 11 mg/dL (ref 6–20)
BUN: 5 mg/dL — AB (ref 6–20)
CALCIUM: 9 mg/dL (ref 8.9–10.3)
CHLORIDE: 107 mmol/L (ref 98–111)
CO2: 9 mmol/L — ABNORMAL LOW (ref 22–32)
CO2: 11 mmol/L — ABNORMAL LOW (ref 22–32)
CREATININE: 1.11 mg/dL (ref 0.61–1.24)
Calcium: 7.7 mg/dL — ABNORMAL LOW (ref 8.9–10.3)
Chloride: 95 mmol/L — ABNORMAL LOW (ref 98–111)
Creatinine, Ser: 1.48 mg/dL — ABNORMAL HIGH (ref 0.61–1.24)
GFR calc Af Amer: >60 mL/min (ref >60)
Glucose, Bld: 498 mg/dL — ABNORMAL HIGH (ref 70–99)
Glucose, Bld: 330 mg/dL — ABNORMAL HIGH (ref 70–99)
POTASSIUM: 4.4 mmol/L (ref 3.5–5.1)
POTASSIUM: 3.5 mmol/L (ref 3.5–5.1)
SODIUM: 129 mmol/L — AB (ref 135–145)
SODIUM: 133 mmol/L — AB (ref 135–145)

## 2018-04-04 LAB — MAGNESIUM: Magnesium: 1.8 mg/dL (ref 1.7–2.4)

## 2018-04-04 LAB — RAPID URINE DRUG SCREEN, HOSP PERFORMED
AMPHETAMINES: NOT DETECTED
Barbiturates: NOT DETECTED
Benzodiazepines: NOT DETECTED
COCAINE: NOT DETECTED
OPIATES: NOT DETECTED
Tetrahydrocannabinol: NOT DETECTED

## 2018-04-04 LAB — CBG MONITORING, ED
GLUCOSE-CAPILLARY: 502 mg/dL — AB (ref 70–99)
GLUCOSE-CAPILLARY: 334 mg/dL — AB (ref 70–99)
GLUCOSE-CAPILLARY: 179 mg/dL — AB (ref 70–99)
Glucose-Capillary: 398 mg/dL — ABNORMAL HIGH (ref 70–99)
Glucose-Capillary: 229 mg/dL — ABNORMAL HIGH (ref 70–99)
Glucose-Capillary: 198 mg/dL — ABNORMAL HIGH (ref 70–99)

## 2018-04-04 LAB — URINALYSIS, ROUTINE W REFLEX MICROSCOPIC
BACTERIA UA: NONE SEEN
Bilirubin Urine: NEGATIVE
Glucose, UA: >=500 mg/dL — AB
HGB URINE DIPSTICK: NEGATIVE
KETONES UR: 80 mg/dL — AB
Leukocytes, UA: NEGATIVE
NITRITE: NEGATIVE
Protein, ur: NEGATIVE mg/dL
SPECIFIC GRAVITY, URINE: 1.017 (ref 1.005–1.030)
pH: 5 (ref 5.0–8.0)

## 2018-04-04 LAB — CBC
HCT: 43.9 % (ref 39.0–52.0)
HEMATOCRIT: 47.8 % (ref 39.0–52.0)
HEMOGLOBIN: 16.3 g/dL (ref 13.0–17.0)
Hemoglobin: 14.8 g/dL (ref 13.0–17.0)
MCH: 28 pg (ref 26.0–34.0)
MCH: 27.7 pg (ref 26.0–34.0)
MCHC: 34.1 g/dL (ref 30.0–36.0)
MCHC: 33.7 g/dL (ref 30.0–36.0)
MCV: 82.1 fL (ref 78.0–100.0)
MCV: 82.2 fL (ref 78.0–100.0)
PLATELETS: 301 10*3/uL (ref 150–400)
Platelets: 348 10*3/uL (ref 150–400)
RBC: 5.82 MIL/uL — AB (ref 4.22–5.81)
RBC: 5.34 MIL/uL (ref 4.22–5.81)
RDW: 12.4 % (ref 11.5–15.5)
RDW: 12.7 % (ref 11.5–15.5)
WBC: 12.1 10*3/uL — AB (ref 4.0–10.5)
WBC: 12.4 10*3/uL — AB (ref 4.0–10.5)

## 2018-04-04 LAB — BETA-HYDROXYBUTYRIC ACID: BETA-HYDROXYBUTYRIC ACID: 3.15 mmol/L — AB (ref 0.05–0.27)

## 2018-04-04 LAB — GLUCOSE, CAPILLARY
GLUCOSE-CAPILLARY: 306 mg/dL — AB (ref 70–99)
Glucose-Capillary: 175 mg/dL — ABNORMAL HIGH (ref 70–99)
Glucose-Capillary: 164 mg/dL — ABNORMAL HIGH (ref 70–99)
Glucose-Capillary: 180 mg/dL — ABNORMAL HIGH (ref 70–99)
Glucose-Capillary: 226 mg/dL — ABNORMAL HIGH (ref 70–99)

## 2018-04-04 MED ORDER — TRAZODONE HCL 50 MG PO TABS: 100.0000 mg | ORAL_TABLET | Freq: Every evening | ORAL | Status: DC | PRN

## 2018-04-04 MED ORDER — FAMOTIDINE IN NACL 20-0.9 MG/50ML-% IV SOLN
20.0000 mg | Freq: Two times a day (BID) | INTRAVENOUS | Status: DC
  Administered 2018-04-04 – 2018-04-05 (×2): 20 mg via INTRAVENOUS
  Filled 2018-04-04 (×2): qty 50

## 2018-04-04 MED ORDER — POTASSIUM CHLORIDE 10 MEQ/100ML IV SOLN: 10.0000 meq | INTRAVENOUS | Status: AC

## 2018-04-04 MED ORDER — ONDANSETRON HCL 4 MG/2ML IJ SOLN
4.0000 mg | Freq: Once | INTRAMUSCULAR | Status: AC
  Administered 2018-04-04: 4 mg via INTRAVENOUS
  Filled 2018-04-04: qty 2

## 2018-04-04 MED ORDER — ALBUTEROL SULFATE (2.5 MG/3ML) 0.083% IN NEBU: 2.5000 mg | INHALATION_SOLUTION | Freq: Four times a day (QID) | RESPIRATORY_TRACT | Status: DC | PRN

## 2018-04-04 MED ORDER — FLUOXETINE HCL 20 MG PO CAPS
20.0000 mg | ORAL_CAPSULE | Freq: Every day | ORAL | Status: DC
  Administered 2018-04-05: 20 mg via ORAL
  Filled 2018-04-04: qty 1

## 2018-04-04 MED ORDER — ENOXAPARIN SODIUM 40 MG/0.4ML ~~LOC~~ SOLN
40.0000 mg | SUBCUTANEOUS | Status: DC
  Filled 2018-04-04: qty 0.4

## 2018-04-04 MED ORDER — CLONIDINE HCL 0.1 MG/24HR TD PTWK
0.1000 mg | MEDICATED_PATCH | TRANSDERMAL | Status: DC
  Filled 2018-04-04 (×2): qty 1

## 2018-04-04 MED ORDER — ONDANSETRON HCL 4 MG/2ML IJ SOLN: 4.0000 mg | Freq: Three times a day (TID) | INTRAMUSCULAR | Status: DC | PRN

## 2018-04-04 MED ORDER — HYDROXYZINE HCL 50 MG/ML IM SOLN
50.0000 mg | Freq: Four times a day (QID) | INTRAMUSCULAR | Status: DC | PRN
  Filled 2018-04-04: qty 1

## 2018-04-04 MED ORDER — HYDROXYZINE HCL 25 MG PO TABS: 50.0000 mg | ORAL_TABLET | Freq: Four times a day (QID) | ORAL | Status: DC | PRN

## 2018-04-04 MED ORDER — DEXTROSE-NACL 5-0.45 % IV SOLN
INTRAVENOUS | Status: DC
  Administered 2018-04-05: 06:00:00 via INTRAVENOUS

## 2018-04-04 MED ORDER — ALBUTEROL SULFATE HFA 108 (90 BASE) MCG/ACT IN AERS
2.0000 | INHALATION_SPRAY | Freq: Four times a day (QID) | RESPIRATORY_TRACT | Status: DC | PRN
  Filled 2018-04-04: qty 6.7

## 2018-04-04 MED ORDER — CHLORPROMAZINE HCL 100 MG PO TABS
100.0000 mg | ORAL_TABLET | Freq: Every day | ORAL | Status: DC
  Administered 2018-04-05: 100 mg via ORAL
  Filled 2018-04-04: qty 1

## 2018-04-04 MED ORDER — FLUTICASONE PROPIONATE HFA 220 MCG/ACT IN AERO: 1.0000 | INHALATION_SPRAY | Freq: Two times a day (BID) | RESPIRATORY_TRACT | Status: DC

## 2018-04-04 MED ORDER — BUDESONIDE 0.5 MG/2ML IN SUSP
0.5000 mg | Freq: Two times a day (BID) | RESPIRATORY_TRACT | Status: DC
  Filled 2018-04-04 (×2): qty 2

## 2018-04-04 MED ORDER — LORAZEPAM 2 MG/ML IJ SOLN
1.0000 mg | Freq: Once | INTRAMUSCULAR | Status: AC
  Administered 2018-04-04: 1 mg via INTRAVENOUS
  Filled 2018-04-04: qty 1

## 2018-04-04 MED ORDER — DEXTROSE-NACL 5-0.45 % IV SOLN
INTRAVENOUS | Status: DC
  Administered 2018-04-04 (×2): via INTRAVENOUS

## 2018-04-04 MED ORDER — SODIUM CHLORIDE 0.9 % IV BOLUS
1000.0000 mL | Freq: Once | INTRAVENOUS | Status: AC
  Administered 2018-04-04: 1000 mL via INTRAVENOUS

## 2018-04-04 MED ORDER — NICOTINE 21 MG/24HR TD PT24
21.0000 mg | MEDICATED_PATCH | Freq: Every day | TRANSDERMAL | Status: DC
  Administered 2018-04-05: 21 mg via TRANSDERMAL
  Filled 2018-04-04: qty 1

## 2018-04-04 MED ORDER — SODIUM CHLORIDE 0.9 % IV SOLN
INTRAVENOUS | Status: DC
  Administered 2018-04-04: 3.4 [IU]/h via INTRAVENOUS
  Filled 2018-04-04: qty 1

## 2018-04-04 MED ORDER — SODIUM CHLORIDE 0.9 % IV SOLN: INTRAVENOUS | Status: DC

## 2018-04-04 MED ORDER — SODIUM CHLORIDE 0.9 % IV SOLN
INTRAVENOUS | Status: AC
  Administered 2018-04-04: 999 mL/h via INTRAVENOUS

## 2018-04-04 NOTE — ED Notes (Addendum)
Pt had assisted fall to floor, while trying to leave AMA. Staff caught him, no injuries sustained, VSS. Dr. Barrie Folk notified - states she will order IM injection for Vistaril. No orders yet. Security notified, present at bedside.

## 2018-04-04 NOTE — Progress Notes (Signed)
Patient refused to have his blood sugar checked.

## 2018-04-04 NOTE — ED Notes (Addendum)
Called to pt bedside as he tries to get out of bed. Movements are slow and pt continues with slurred speech and ataxic movements. States he "fucking wants to leave." Girlfriend at bedside. States pt is withdrawing from drugs.  Admitting MD contactedc face to face about situation and vs.

## 2018-04-04 NOTE — Progress Notes (Signed)
[name] [name]. is a 17 y.o. male patient admitted from ED awake, alert - oriented  X 4 - no acute distress noted.  VSS - Blood pressure (!) 137/92, pulse (!) 112, temperature 98 F (36.7 C), temperature source Oral, resp. rate (!) 22, SpO2 100 %.    IV in place, occlusive dsg intact without redness.  Orientation to room, and floor completed with information packet given to patient/family.  Patient declined safety video at this time.  Admission INP armband ID verified with patient/family, and in place.   SR up x 2, fall assessment complete, with patient and family able to verbalize understanding of risk associated with falls, and verbalized understanding to call nsg before up out of bed.  Call light within reach, patient able to voice, and demonstrate understanding.  Skin, clean-dry- intact without evidence of bruising, or skin tears.   No evidence of skin break down noted on exam.   Patient refused vitals on admission and patient is high on meth, MD notified.  Will cont to eval and treat per MD orders.  Marca Ancona, RN 04/04/2018 2:58 PM

## 2018-04-04 NOTE — Progress Notes (Signed)
Patient is a triple x, he does not want information shared with his step father and girl friend.

## 2018-04-04 NOTE — ED Notes (Signed)
Pt pacing around the room, appears agitated. Friend at bedside reports pt having hematemesis; provider made aware

## 2018-04-04 NOTE — ED Notes (Signed)
Pt stands at bedsideusing urinal. Pt very unsteady and assisted back onto bed. Pt caustiened not to get out of bedwithput assist. Responds is slurred and staet he wants to "fucking walk:"

## 2018-04-04 NOTE — H&P (Addendum)
History and Physical  [name] [name]. WUJ:[id number] DOB: 2001/02/23 DOA: 04/04/2018  Referring physician: Dr. Judd Lien PCP: System, Pcp Not In  Outpatient Specialists: None Patient coming from: Home & is able to ambulate yes  Chief Complaint: DKA  HPI: [name] [name]. is a 17 y.o. male with medical history significant for  type 1 diabetes mellitus, cocaine and other substance abuse, ADHD, asthma, depression anxiety, and epileptic seizures he was seen and admitted to this hospital on April 02, 2018 but he signed out AGAINST MEDICAL ADVICE yesterday, stating a family emergency.  He returns this morning with complaints of still being DKA and feels worse than yesterday. With nausea vomiting poor appetite feeling weak and fatigued.  Apparently he was in jail and discharged to homeless shelter during which time he did not get any of his insulin for 2 days.  ED Course: Patient was started on IV insulin drip drip patient was threatening to sign out AGAINST MEDICAL ADVICE again today much effort was spent trying to convince him to stay .  He was tachycardic has some Kussmaul breathing and unsteady in his gait.  He tried to get up from his bed multiple times and almost fell in the ED. he kept asking for his brother to call him that he wanted to see his brother his brother was called security called his brother who was on his way  Review of Systems: Complaining of nausea some abdominal pain.  Pt denies any no diarrhea no fever no chills.  Review of systems are otherwise negative   Past Medical History:  Diagnosis Date  . ADD (attention deficit disorder)   . Allergy   . Anxiety   . Asthma   . Depression   . Epileptic seizure (HCC)    "both petit and grand mal; last sz ~ 2 wk ago" (06/17/2013)  . Headache(784.0)    "2-3 times/wk usually" (06/17/2013)  . Heart murmur    "heard a slight one earlier today" (06/17/2013)  . Migraine    "maybe once/month; it's severe" (06/17/2013)  . ODD  (oppositional defiant disorder)   . Sickle cell trait (HCC)   . Type I diabetes mellitus (HCC)    "that's what they're thinking now" (06/17/2013)  . Vision abnormalities    "takes him longer to focus cause; from his sz" (06/17/2013)   Past Surgical History:  Procedure Laterality Date  . CIRCUMCISION  2000  . FINGER SURGERY Left 2001   "crushed pinky; had to repair it" (06/17/2013)    Social History:  reports that he is a non-smoker but has been exposed to tobacco smoke. He has never used smokeless tobacco. He reports that he does not drink alcohol or use drugs.   Allergies  Allergen Reactions  . Bee Venom Anaphylaxis  . Fish Allergy Anaphylaxis  . Shellfish Allergy Anaphylaxis    Family History  Problem Relation Age of Onset  . Asthma Mother   . COPD Mother   . Other Mother        possible autoimmune, unclear  . Diabetes Maternal Grandmother   . Heart disease Maternal Grandmother   . Hypertension Maternal Grandmother   . Mental illness Maternal Grandmother   . Heart disease Maternal Grandfather   . Hyperlipidemia Paternal Grandmother   . Hyperlipidemia Paternal Grandfather   . Migraines Sister        Hemiplegic Migraines       Prior to Admission medications   Medication Sig Start Date End Date Taking? Authorizing  Provider  chlorproMAZINE (THORAZINE) 100 MG tablet Take 1 tablet (100 mg total) by mouth at bedtime. For mood control Patient taking differently: Take 100 mg by mouth daily.  03/09/18  Yes Armandina Stammer I, NP  FLUoxetine (PROZAC) 20 MG capsule Take 1 capsule (20 mg total) by mouth daily. For depression 03/10/18  Yes Nwoko, Nicole Kindred I, NP  Fluticasone Propionate HFA (FLOVENT HFA IN) Inhale 2 puffs into the lungs daily.   Yes [provider]  hydrOXYzine (ATARAX/VISTARIL) 50 MG tablet Take 1 tablet (50 mg total) by mouth every 6 (six) hours as needed for anxiety. 03/09/18  Yes Nwoko, Nicole Kindred I, NP  insulin aspart (NOVOLOG FLEXPEN) 100 UNIT/ML FlexPen Inject  0-15 Units into the skin 3 (three) times daily with meals. (Sliding scale): For diabetes management 03/09/18  Yes Armandina Stammer I, NP  insulin glargine (LANTUS) 100 UNIT/ML injection Inject 0.35 mLs (35 Units total) into the skin 2 (two) times daily. 03/21/18  Yes Joy, Shawn C, PA-C  nicotine (NICODERM CQ - DOSED IN MG/24 HOURS) 21 mg/24hr patch Place 1 patch (21 mg total) onto the skin daily. (May purchase from over the counter): For smoking cessation 03/10/18  Yes Armandina Stammer I, NP  traZODone (DESYREL) 100 MG tablet Take 1 tablet (100 mg total) by mouth at bedtime as needed for sleep. 03/09/18  Yes Armandina Stammer I, NP  albuterol (PROVENTIL HFA;VENTOLIN HFA) 108 (90 Base) MCG/ACT inhaler Inhale 2 puffs into the lungs every 6 (six) hours as needed for wheezing or shortness of breath. Patient not taking: Reported on 04/02/2018 03/09/18   Armandina Stammer I, NP  glucose blood (ACCU-CHEK GUIDE) test strip Check glucose 6x daily: For blood sugar checks 03/22/18   Long, Arlyss Repress, MD    Physical Exam: BP 127/70   Pulse (!) 107   Temp 97.7 F (36.5 C)   Resp (!) 25   SpO2 100%   General: Alert oriented x3 slightly drowsy from giving him Ativan patient was agitated wanting to leave AMA Eyes: Clear ENT: Clear Neck: Supple Cardiovascular: Tachycardic but regular Respiratory: Increased respiration work of breathing is normal however Abdomen: Nontender Skin: Diaphoresis Musculoskeletal: Ambulatory but unsteady in his gait Psychiatric: Patient is cursing, agitated being uncooperative ,he pulled out his IV.  When we discussed about IVC him he stated they will not IVC him and that he was going to leave Neurologic: Awake drowsy fidgety          Labs on Admission:  Basic Metabolic Panel: Recent Labs  Lab 04/02/18 0920 04/02/18 1526 04/04/18 0425  NA 129* 134* 129*  K 4.9 4.0 4.4  CL 93* 101 95*  CO2 16* 20* 9*  GLUCOSE 665* 329* 498*  BUN 12 10 11   CREATININE 1.27* 0.92 1.48*  CALCIUM 9.2 9.2 9.0    Liver Function Tests: No results for input(s): AST, ALT, ALKPHOS, BILITOT, PROT, ALBUMIN in the last 168 hours. No results for input(s): LIPASE, AMYLASE in the last 168 hours. No results for input(s): AMMONIA in the last 168 hours. CBC: Recent Labs  Lab 04/02/18 0920 04/04/18 0425  WBC 9.3 12.1*  HGB 15.1 16.3  HCT 46.5 47.8  MCV 84.4 82.1  PLT 285 348   Cardiac Enzymes: No results for input(s): CKTOTAL, CKMB, CKMBINDEX, TROPONINI in the last 168 hours.  BNP (last 3 results) No results for input(s): BNP in the last 8760 hours.  ProBNP (last 3 results) No results for input(s): PROBNP in the last 8760 hours.  CBG: Recent Labs  Lab 04/02/18 1755 04/02/18 2058 04/04/18 0418 04/04/18 0755 04/04/18 0928  GLUCAP 171* 538* 502* 398* 334*    Radiological Exams on Admission: No results found.  EKG: IPer ER MD  Assessment/Plan Present on Admission: . DKA (diabetic ketoacidoses) (HCC) . DKA, type 1 (HCC)  1.  DKA patient was given 2 doses of IV bolus normal saline and blood sugar came down to 137 he was started on D5 but suddenly he became agitated and pulled out his IV.  He was still on insulin drip but he pulled that out as well.  2.  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  He had left yesterday AGAINST MEDICAL ADVICE to attend to a family matter apparently he went out to do drugs and had methamphetamine.  He kept calling for his brother to come apparently his brother had his all his drugs.  3.  Anxiety number major depressive disorder disorder he was restarted on his depression and on the anxiety medicine which includ Vistaril fluoxetine he was also given Ativan IV to calm him down  4.  Borderline personality disorder continue Thorazine 100 mg  5.  Hyponatremia.  We will replace with the IV fluid normal saline.  IV fluid fluid normal saline has been started, he already received in the emergency room boluses of normal saline  6.  AKI with  bumping of his creatinine from 1.27 on April 02 2018 to 1.48 on April 04, 2018, most likely from his DKA with dehydration.  Patient is being hydrated  7.  Mild leukocytosis this might be reactive from his DKA we will continue to monitor  8.  Type 1 diabetes mellitus.  Patient is currently in DKA  Active Problems:   DKA (diabetic ketoacidoses) (HCC)   DKA, type 1 (HCC)   DVT prophylaxis: Lovenox  Code Status: Full  Family Communication: Friend at bedside and his brother and sister who later came in  Disposition Plan: Home  Consults called: None  Admission status: Inpatient stepdown due to IV insulin    Myrtie Neither MD [facility] Pager [phone]  If 7PM-7AM, please contact night-coverage [email or web address] Password [email or web address]  04/04/2018, 9:48 AM
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
## 2018-04-04 NOTE — ED Notes (Signed)
Dr. Barrie Folk rounded at bedside, stated she will put in an order for IM Vistaril. Brother now visiting, hoping to calm pt down.

## 2018-04-04 NOTE — Progress Notes (Signed)
Parole Officer- Glenna Durand  [phone]  Agent Katherina Right [phone]  Social work needs to call these numbers on Monday. Needs to have proof of why he is here.

## 2018-04-04 NOTE — ED Triage Notes (Signed)
Pt reports he was admitted to [facility] yesterday for DKA, states he had to leave ama due to a family issue but now feels like symptoms of dka are getting worse, having n/v abd pain. Has not checked sugar because he reports he is homeless and has no way to check it. A/ox4

## 2018-04-04 NOTE — ED Provider Notes (Signed)
[facility] EMERGENCY DEPARTMENT Provider Note   CSN: [id number] Arrival date & time: 04/04/18  0404     History   Chief Complaint Chief Complaint  Patient presents with  . Diabetic Ketoacidosis    HPI [name] [name]. is a 17 y.o. male.  Patient is a 17 year old male with past medical history of type 1 diabetes.  He presents for evaluation of nausea, vomiting, and generalized body aches.  He was diagnosed 2 days ago with diabetic ketoacidosis.  He was in the hospital for a short period of time, then signed out AGAINST MEDICAL ADVICE due to a family emergency.  He returns today stating that he feels worse than when he originally was admitted.  He tells me that he ran out of his insulin 2 weeks ago.  He denies any fevers or chills.  The history is provided by the patient.    Past Medical History:  Diagnosis Date  . ADD (attention deficit disorder)   . Allergy   . Anxiety   . Asthma   . Depression   . Epileptic seizure (HCC)    "both petit and grand mal; last sz ~ 2 wk ago" (06/17/2013)  . Headache(784.0)    "2-3 times/wk usually" (06/17/2013)  . Heart murmur    "heard a slight one earlier today" (06/17/2013)  . Migraine    "maybe once/month; it's severe" (06/17/2013)  . ODD (oppositional defiant disorder)   . Sickle cell trait (HCC)   . Type I diabetes mellitus (HCC)    "that's what they're thinking now" (06/17/2013)  . Vision abnormalities    "takes him longer to focus cause; from his sz" (06/17/2013)    Patient Active Problem List   Diagnosis Date Noted  . Borderline personality disorder (HCC) 02/26/2018  . 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 DKA, type 1 (HCC) 07/16/2017  . Nausea & vomiting 07/16/2017  . Uncontrolled type 1 diabetes circulatory disorder erectile dysfunction (HCC)   . DKA (diabetic  ketoacidoses) (HCC) 12/18/2016  . History of seizures 12/01/2016  . History of migraine 12/01/2016  . Disordered eating 03/08/2015  . Ketonuria   . Adjustment reaction to medical therapy   . Non compliance w medication regimen   . 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 Dehydration 08/22/2014  . Hyperglycemia due to type 1 diabetes mellitus (HCC) 08/21/2014  . Type 1 diabetes mellitus with hyperglycemia (HCC)   . Noncompliance   . Generalized abdominal pain   . Glycosuria   . Depression 07/12/2014  . Involuntary movements 06/13/2014  . Bilateral leg pain 06/13/2014  . 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 Suicidal ideation 03/31/2014  . ODD (oppositional defiant disorder) 03/03/2014  . MDD (major depressive disorder), recurrent episode, moderate (HCC) 02/16/2014  . Attention deficit hyperactivity disorder (ADHD), combined type, moderate 02/16/2014  . Microcytic anemia 02/15/2014  . Hyperglycemia 02/14/2014  . Goiter 02/04/2014  . 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 Acquired acanthosis nigricans 02/04/2014  . Obesity, morbid (HCC) 02/04/2014  . Insulin resistance 02/04/2014  . Hyperinsulinemia 02/04/2014  . Hypoglycemia associated with diabetes (HCC) 02/02/2014  . Maladaptive health behaviors affecting medical condition 02/02/2014  . Hypoglycemia 02/02/2014  . Type 1 diabetes mellitus in patient 67 to 19 years of age with hemoglobin A1c goal of  less than 7.5% (HCC) 01/26/2014  . 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  . 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 Migraine without aura 12/13/2013  . Body mass index, pediatric, greater than or equal to 95th percentile for age 39/16/2015  . Asthma 12/13/2013  . Hypoglycemia unawareness in type 1 diabetes mellitus (HCC) 08/08/2013  . Seizure  disorder (HCC) 07/06/2013  . Short-term memory loss 07/06/2013  . Sickle cell trait (HCC) 07/06/2013  . Diabetes (HCC) 06/17/2013  . Diabetes mellitus, new onset (HCC) 06/17/2013    Past Surgical History:  Procedure Laterality Date  . CIRCUMCISION  2000  . FINGER SURGERY Left 2001   "crushed pinky; had to repair it" (06/17/2013)        Home Medications    Prior to Admission medications   Medication Sig Start Date End Date Taking? Authorizing Provider  albuterol (PROVENTIL HFA;VENTOLIN HFA) 108 (90 Base) MCG/ACT inhaler Inhale 2 puffs into the lungs every 6 (six) hours as needed for wheezing or shortness of breath. Patient not taking: Reported on 04/02/2018 03/09/18   Armandina Stammer I, NP  chlorproMAZINE (THORAZINE) 100 MG tablet Take 1 tablet (100 mg total) by mouth at bedtime. For mood control Patient taking differently: Take 100 mg by mouth daily.  03/09/18   Armandina Stammer I, NP  FLUoxetine (PROZAC) 20 MG capsule Take 1 capsule (20 mg total) by mouth daily. For depression 03/10/18   Armandina Stammer I, NP  Fluticasone Propionate HFA (FLOVENT HFA IN) Inhale 2 puffs into the lungs daily.    [provider]  glucose blood (ACCU-CHEK GUIDE) test strip Check glucose 6x daily: For blood sugar checks 03/22/18   Long, Arlyss Repress, MD  hydrOXYzine (ATARAX/VISTARIL) 50 MG tablet Take 1 tablet (50 mg total) by mouth every 6 (six) hours as needed for anxiety. 03/09/18   Armandina Stammer I, NP  insulin aspart (NOVOLOG FLEXPEN) 100 UNIT/ML FlexPen Inject 0-15 Units into the skin 3 (three) times daily with meals. (Sliding scale): For diabetes management 03/09/18   Armandina Stammer I, NP  insulin glargine (LANTUS) 100 UNIT/ML injection Inject 0.35 mLs (35 Units total) into the skin 2 (two) times daily. 03/21/18   Joy, Shawn C, PA-C  nicotine (NICODERM CQ - DOSED IN MG/24 HOURS) 21 mg/24hr patch Place 1 patch (21 mg total) onto the skin daily. (May purchase from over the counter): For smoking cessation 03/10/18    Armandina Stammer I, NP  traZODone (DESYREL) 100 MG tablet Take 1 tablet (100 mg total) by mouth at bedtime as needed for sleep. 03/09/18   Sanjuana Kava, NP    Family History Family History  Problem Relation Age of Onset  . Asthma Mother   . COPD Mother   . Other Mother        possible autoimmune, unclear  . Diabetes Maternal Grandmother   . Heart disease Maternal Grandmother   . Hypertension Maternal Grandmother   . Mental illness Maternal Grandmother   . Heart disease Maternal Grandfather   . Hyperlipidemia Paternal Grandmother   . Hyperlipidemia Paternal Grandfather   . Migraines Sister        Hemiplegic Migraines     Social History Social History   Tobacco Use  . Smoking status: Passive Smoke Exposure - Never Smoker  . Smokeless tobacco: Never Used  . Tobacco comment: Mom and dad smoke outside   Substance Use Topics  . Alcohol use: No    Alcohol/week: 0.0 standard drinks  . Drug use: No  Allergies   Bee venom; Fish allergy; and Shellfish allergy   Review of Systems Review of Systems  All other systems reviewed and are negative.    Physical Exam Updated Vital Signs BP 128/86   Pulse (!) 108   Temp 97.7 F (36.5 C)   Resp (!) 22   SpO2 99%   Physical Exam  Constitutional: He is oriented to person, place, and time. He appears well-developed and well-nourished. No distress.  HENT:  Head: Normocephalic and atraumatic.  Mucous membranes are dry.  Neck: Normal range of motion. Neck supple.  Cardiovascular: Normal rate and regular rhythm. Exam reveals no friction rub.  No murmur heard. Pulmonary/Chest: Effort normal and breath sounds normal. No respiratory distress. He has no wheezes. He has no rales.  Abdominal: Soft. Bowel sounds are normal. He exhibits no distension. There is no tenderness.  Musculoskeletal: Normal range of motion. He exhibits no edema.  Neurological: He is alert and oriented to person, place, and time. Coordination normal.  Skin: Skin  is warm and dry. Capillary refill takes more than 3 seconds. He is not diaphoretic. There is pallor.  Skin with poor turgor  Nursing note and vitals reviewed.    ED Treatments / Results  Labs (all labs ordered are listed, but only abnormal results are displayed) Labs Reviewed  BASIC METABOLIC PANEL - Abnormal; Notable for the following components:      Result Value   Sodium 129 (*)    Chloride 95 (*)    CO2 9 (*)    Glucose, Bld 498 (*)    Creatinine, Ser 1.48 (*)    Anion gap 25 (*)    All other components within normal limits  CBC - Abnormal; Notable for the following components:   WBC 12.1 (*)    RBC 5.82 (*)    All other components within normal limits  CBG MONITORING, ED - Abnormal; Notable for the following components:   Glucose-Capillary 502 (*)    All other components within normal limits  URINALYSIS, ROUTINE W REFLEX MICROSCOPIC    EKG None  Radiology No results found.  Procedures Procedures (including critical care time)  Medications Ordered in ED Medications  sodium chloride 0.9 % bolus 1,000 mL (has no administration in time range)  dextrose 5 %-0.45 % sodium chloride infusion (has no administration in time range)  insulin regular (NOVOLIN R,HUMULIN R) 100 Units in sodium chloride 0.9 % 100 mL (1 Units/mL) infusion (has no administration in time range)  ondansetron (ZOFRAN) injection 4 mg (has no administration in time range)     Initial Impression / Assessment and Plan / ED Course  I have reviewed the triage vital signs and the nursing notes.  Pertinent labs & imaging results that were available during my care of the patient were reviewed by me and considered in my medical decision making (see chart for details).  Patient admitted 2 days ago for DKA, but left AGAINST MEDICAL ADVICE due to a "family emergency".  He returns tonight feeling worse than he did when he was previously admitted.  His laboratory studies are reflective of DKA.  He has an anion  gap and CO2 of 9.  Fluids were initiated as was the glucose stabilizer.  I have discussed with Dr. Clyde Lundborg from the hospitalist service who agrees to admit.  CRITICAL CARE Performed by: Geoffery Lyons Total critical care time: 35 minutes Critical care time was exclusive of separately billable procedures and treating other patients. Critical care was necessary to treat  or prevent imminent or life-threatening deterioration. Critical care was time spent personally by me on the following activities: development of treatment plan with patient and/or surrogate as well as nursing, discussions with consultants, evaluation of patient's response to treatment, examination of patient, obtaining history from patient or surrogate, ordering and performing treatments and interventions, ordering and review of laboratory studies, ordering and review of radiographic studies, pulse oximetry and re-evaluation of patient's condition.   Final Clinical Impressions(s) / ED Diagnoses   Final diagnoses:  None    ED Discharge Orders    None       Geoffery Lyons, MD 04/04/18 [phone]

## 2018-04-04 NOTE — Progress Notes (Signed)
Patient states that he is about to snap and "Fuck everyone up". This is his first time smoking Meth.

## 2018-04-04 NOTE — Care Management (Signed)
This is a no charge note  Pending admission per Dr. Judd Lien  17 year old male with past medical history of type 1 diabetes, ADD, ODD, depression with anxiety, who was admitted yesterday due to mild DKA, but the patient left the hospital on AMA.  He comes back with nausea, vomiting and abdominal pain.  Found to have DKA with anion gap 25, blood sugar 498, bicarbonate 9.  Insulin drip started in the ED.  Patient is placed on stepdown bed for observation.    Lorretta Harp, MD  [facility] Pager [phone]  If 7PM-7AM, please contact night-coverage [email or web address] Password [facility] 04/04/2018, 6:33 AM

## 2018-04-04 NOTE — ED Notes (Signed)
Pt states to this RN he did use cocaine when he left the hospital last night.

## 2018-04-04 NOTE — ED Notes (Signed)
Pt resting with eyes closed but turns self and responds to voice and touch.

## 2018-04-04 NOTE — ED Notes (Signed)
MD at bedside. 

## 2018-04-04 NOTE — ED Notes (Signed)
Pt CBG 398. RN notified.

## 2018-04-04 NOTE — Progress Notes (Signed)
Patient wants to make sure his girl friend and step father is not allowed back in his room.

## 2018-04-04 NOTE — Progress Notes (Signed)
Patient refused to be hooked up to the cardiac monitor.

## 2018-04-04 NOTE — Progress Notes (Signed)
Patient stated he is high on Meth, MD notified.

## 2018-04-04 NOTE — ED Notes (Signed)
Pt got up again, agitated and unsteady, pulled out IV. Stating he is going to leave AMA, pt's brother on way per security. Dr. Barrie Folk notified, RN asked doc to round on pt.

## 2018-04-05 DIAGNOSIS — E0810 Diabetes mellitus due to underlying condition with ketoacidosis without coma: Secondary | ICD-10-CM

## 2018-04-05 DIAGNOSIS — F54 Psychological and behavioral factors associated with disorders or diseases classified elsewhere: Secondary | ICD-10-CM

## 2018-04-05 DIAGNOSIS — Z9119 Patient's noncompliance with other medical treatment and regimen: Secondary | ICD-10-CM

## 2018-04-05 DIAGNOSIS — E876 Hypokalemia: Secondary | ICD-10-CM

## 2018-04-05 LAB — BLOOD CULTURE ID PANEL (REFLEXED)
Acinetobacter baumannii: NOT DETECTED
CANDIDA PARAPSILOSIS: NOT DETECTED
Candida albicans: NOT DETECTED
Candida glabrata: NOT DETECTED
Candida krusei: NOT DETECTED
Candida tropicalis: NOT DETECTED
Enterobacter cloacae complex: NOT DETECTED
Enterobacteriaceae species: NOT DETECTED
Enterococcus species: NOT DETECTED
Escherichia coli: NOT DETECTED
HAEMOPHILUS INFLUENZAE: NOT DETECTED
KLEBSIELLA OXYTOCA: NOT DETECTED
Klebsiella pneumoniae: NOT DETECTED
Listeria monocytogenes: NOT DETECTED
Neisseria meningitidis: NOT DETECTED
Proteus species: NOT DETECTED
Pseudomonas aeruginosa: NOT DETECTED
STREPTOCOCCUS AGALACTIAE: NOT DETECTED
STREPTOCOCCUS SPECIES: DETECTED — AB
Serratia marcescens: NOT DETECTED
Staphylococcus aureus (BCID): NOT DETECTED
Staphylococcus species: NOT DETECTED
Streptococcus pneumoniae: NOT DETECTED
Streptococcus pyogenes: NOT DETECTED

## 2018-04-05 LAB — GLUCOSE, CAPILLARY
GLUCOSE-CAPILLARY: 232 mg/dL — AB (ref 70–99)
GLUCOSE-CAPILLARY: 201 mg/dL — AB (ref 70–99)
GLUCOSE-CAPILLARY: 114 mg/dL — AB (ref 70–99)
GLUCOSE-CAPILLARY: 320 mg/dL — AB (ref 70–99)
Glucose-Capillary: 121 mg/dL — ABNORMAL HIGH (ref 70–99)
Glucose-Capillary: 135 mg/dL — ABNORMAL HIGH (ref 70–99)
Glucose-Capillary: 143 mg/dL — ABNORMAL HIGH (ref 70–99)
Glucose-Capillary: 143 mg/dL — ABNORMAL HIGH (ref 70–99)
Glucose-Capillary: 161 mg/dL — ABNORMAL HIGH (ref 70–99)
Glucose-Capillary: 345 mg/dL — ABNORMAL HIGH (ref 70–99)

## 2018-04-05 LAB — VITAMIN B12: Vitamin B-12: 435 pg/mL (ref 180–914)

## 2018-04-05 LAB — BASIC METABOLIC PANEL
Anion gap: 8 (ref 5–15)
BUN: <5 mg/dL — ABNORMAL LOW (ref 6–20)
CALCIUM: 8.4 mg/dL — AB (ref 8.9–10.3)
CHLORIDE: 110 mmol/L (ref 98–111)
CO2: 18 mmol/L — AB (ref 22–32)
CREATININE: 0.73 mg/dL (ref 0.61–1.24)
GFR calc Af Amer: >60 mL/min (ref >60)
GFR calc non Af Amer: >60 mL/min (ref >60)
Glucose, Bld: 128 mg/dL — ABNORMAL HIGH (ref 70–99)
Potassium: 2.8 mmol/L — ABNORMAL LOW (ref 3.5–5.1)
Sodium: 136 mmol/L (ref 135–145)

## 2018-04-05 LAB — CBC
HCT: 43.4 % (ref 39.0–52.0)
Hemoglobin: 15.1 g/dL (ref 13.0–17.0)
MCH: 27.9 pg (ref 26.0–34.0)
MCHC: 34.8 g/dL (ref 30.0–36.0)
MCV: 80.2 fL (ref 78.0–100.0)
PLATELETS: 267 10*3/uL (ref 150–400)
RBC: 5.41 MIL/uL (ref 4.22–5.81)
RDW: 12.7 % (ref 11.5–15.5)
WBC: 8.9 10*3/uL (ref 4.0–10.5)

## 2018-04-05 LAB — HEMOGLOBIN A1C
HEMOGLOBIN A1C: 10.8 % — AB (ref 4.8–5.6)
Mean Plasma Glucose: 263.26 mg/dL

## 2018-04-05 LAB — TSH: TSH: 1.584 u[IU]/mL (ref 0.350–4.500)

## 2018-04-05 LAB — MAGNESIUM: Magnesium: 2 mg/dL (ref 1.7–2.4)

## 2018-04-05 MED ORDER — INSULIN ASPART 100 UNIT/ML ~~LOC~~ SOLN: 0.0000 [IU] | Freq: Every day | SUBCUTANEOUS | Status: DC

## 2018-04-05 MED ORDER — POTASSIUM CHLORIDE CRYS ER 20 MEQ PO TBCR
40.0000 meq | EXTENDED_RELEASE_TABLET | Freq: Once | ORAL | Status: AC
  Administered 2018-04-05: 40 meq via ORAL
  Filled 2018-04-05: qty 2

## 2018-04-05 MED ORDER — POTASSIUM CHLORIDE 10 MEQ/100ML IV SOLN
10.0000 meq | INTRAVENOUS | Status: DC
  Administered 2018-04-05: 10 meq via INTRAVENOUS
  Filled 2018-04-05 (×2): qty 100

## 2018-04-05 MED ORDER — INSULIN GLARGINE 100 UNIT/ML ~~LOC~~ SOLN
20.0000 [IU] | Freq: Two times a day (BID) | SUBCUTANEOUS | Status: DC
  Administered 2018-04-05: 20 [IU] via SUBCUTANEOUS
  Filled 2018-04-05 (×2): qty 0.2

## 2018-04-05 MED ORDER — LACTATED RINGERS IV SOLN
INTRAVENOUS | Status: DC
  Administered 2018-04-05: 18:00:00 via INTRAVENOUS

## 2018-04-05 MED ORDER — INSULIN GLARGINE 100 UNIT/ML ~~LOC~~ SOLN
30.0000 [IU] | Freq: Two times a day (BID) | SUBCUTANEOUS | Status: DC
  Filled 2018-04-05: qty 0.3

## 2018-04-05 MED ORDER — SODIUM CHLORIDE 0.9 % IV SOLN
INTRAVENOUS | Status: DC
  Administered 2018-04-05: 11:00:00 via INTRAVENOUS
  Filled 2018-04-05: qty 1000

## 2018-04-05 MED ORDER — FAMOTIDINE 20 MG PO TABS: 20.0000 mg | ORAL_TABLET | Freq: Two times a day (BID) | ORAL | Status: DC

## 2018-04-05 MED ORDER — INSULIN ASPART 100 UNIT/ML ~~LOC~~ SOLN
0.0000 [IU] | Freq: Three times a day (TID) | SUBCUTANEOUS | Status: DC
  Administered 2018-04-05: 8 [IU] via SUBCUTANEOUS
  Administered 2018-04-05: 11 [IU] via SUBCUTANEOUS

## 2018-04-05 NOTE — Progress Notes (Signed)
Patient complaining of IV potassium burning, despite rate being slowed down. Patient refusing further IV infusions. IV potassium discontinued, Dr. Thedore Mins notified.  Will continue to monitor. Asher Muir CovingtonRN

## 2018-04-05 NOTE — Progress Notes (Signed)
PHARMACY - PHYSICIAN COMMUNICATION CRITICAL VALUE ALERT - BLOOD CULTURE IDENTIFICATION (BCID)  [name] [name]. is an 17 y.o. male who presented to [facility] on 04/04/2018 with a chief complaint of DKA.  Assessment:  17 yo M presenting to the ED with DKA. Patient presented to the ED on 10/4 for abdominal pain and hyperglycemia but left AMA. WBC peaked at 12.4. Patient afebrile. 1/4 cultures strep species. No other signs/symptoms of infection present. Culture is likely a contaminant.  Name of physician (or Provider) Contacted: Dr. Thedore Mins  Current antibiotics: none  Changes to prescribed antibiotics recommended:  No changes needed.   Results for orders placed or performed during the hospital encounter of 04/04/18  Blood Culture ID Panel (Reflexed) (Collected: 04/04/2018  3:38 PM)  Result Value Ref Range   Enterococcus species NOT DETECTED NOT DETECTED   Listeria monocytogenes NOT DETECTED NOT DETECTED   Staphylococcus species NOT DETECTED NOT DETECTED   Staphylococcus aureus (BCID) NOT DETECTED NOT DETECTED   Streptococcus species DETECTED (A) NOT DETECTED   Streptococcus agalactiae NOT DETECTED NOT DETECTED   Streptococcus pneumoniae NOT DETECTED NOT DETECTED   Streptococcus pyogenes NOT DETECTED NOT DETECTED   Acinetobacter baumannii NOT DETECTED NOT DETECTED   Enterobacteriaceae species NOT DETECTED NOT DETECTED   Enterobacter cloacae complex NOT DETECTED NOT DETECTED   Escherichia coli NOT DETECTED NOT DETECTED   Klebsiella oxytoca NOT DETECTED NOT DETECTED   Klebsiella pneumoniae NOT DETECTED NOT DETECTED   Proteus species NOT DETECTED NOT DETECTED   Serratia marcescens NOT DETECTED NOT DETECTED   Haemophilus influenzae NOT DETECTED NOT DETECTED   Neisseria meningitidis NOT DETECTED NOT DETECTED   Pseudomonas aeruginosa NOT DETECTED NOT DETECTED   Candida albicans NOT DETECTED NOT DETECTED   Candida glabrata NOT DETECTED NOT DETECTED   Candida krusei NOT DETECTED NOT  DETECTED   Candida parapsilosis NOT DETECTED NOT DETECTED   Candida tropicalis NOT DETECTED NOT DETECTED    Danae Orleans, PharmD PGY1 Pharmacy Resident Phone [phone] 04/05/2018       1:16 PM

## 2018-04-05 NOTE — Progress Notes (Signed)
PT Cancellation Note  Patient Details Name: [name] [name]. MRN: [id number] DOB: [birth year]   Cancelled Treatment:    Reason Eval/Treat Not Completed: Other (comment) RN reporting pt is sleeping and requesting to hold on PT as pt has been agitated. Will follow up as schedule allows.   Gladys Damme, PT, DPT  Acute Rehabilitation Services  Pager: [phone] Office: [phone]    [name] 04/05/2018, 11:23 AM

## 2018-04-05 NOTE — Progress Notes (Addendum)
PROGRESS NOTE                                                                                                                                                                                                             Patient Demographics:    [name] [name], is a 17 y.o. male, DOB - [birth year], ZOX:[id number]  Admit date - 04/04/2018   Admitting Physician Myrtie Neither, MD  Outpatient Primary MD for the patient is System, Pcp Not In  LOS - 1  Chief Complaint  Patient presents with  . Diabetic Ketoacidosis       Brief Narrative  [name] Bass. is a 17 y.o. male with medical history significant for 17 year old male with past medical history of type 1 diabetes mellitus cocaine and other substance abuse, he was seen but he signed out AGAINST MEDICAL ADVICE stating and needs family emergency he returns this morning with complaints of still being DKA and feels worse than yesterday.   Subjective:    [name] [name] today has, No headache, No chest pain, No abdominal pain - No Nausea, No new weakness tingling or numbness, No Cough - SOB.     Assessment  & Plan :     1.  DKA in a patient with type 1 diabetes mellitus who is insulin-dependent and noncompliant.  Signed out AMA few days ago for similar problem.  DKA has resolved, he has been hydrated, transition to Lantus and sliding scale, A1c is 10.5 suggesting poor outpatient control.  Counseled on compliance.  Advance diet and monitor.  2.  History of anxiety and depression.  He is currently not suicidal homicidal and for now cooperating with the staff, overnight was belligerent, counseled, he has good insight, continue Fluoxetine & Thorazine at home dose along recommend outpatient psych and PCP follow-up and monitoring closely.   3.15 pm - Received a 2 Calls from Dr Renato Gails from [place] and Dr Yetta Barre [facility] patient's psychologist - requesting Psych eval while he is here, they think he might be danger to himself or others. Dr  Yetta Barre can be reached at [phone]. Psych consult called to Dr [name] by me at 3.40 pm.   3.  Hypokalemia.  Replace and monitor.  4.  Generalized weakness, hypokalemia along with peripheral neuropathy.  PT eval and monitor. Check TSH-B12.  5.  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  Counseled to quit all.    Family Communication  :  None  Code Status :  Full  Disposition  Plan  :  Home 1-2 days  Consults  :  None  Procedures  :    DVT Prophylaxis  :  Lovenox    Lab Results  Component Value Date   PLT 267 04/05/2018    Diet :  Diet Order            Diet Carb Modified Fluid consistency: Thin; Room service appropriate? Yes  Diet effective now               Inpatient Medications Scheduled Meds: . budesonide (PULMICORT) nebulizer solution  0.5 mg Nebulization BID  . chlorproMAZINE  100 mg Oral Daily  . cloNIDine  0.1 mg Transdermal Weekly  . enoxaparin (LOVENOX) injection  40 mg Subcutaneous Q24H  . FLUoxetine  20 mg Oral Daily  . insulin aspart  0-15 Units Subcutaneous TID WC  . insulin aspart  0-5 Units Subcutaneous QHS  . insulin glargine  20 Units Subcutaneous BID  . nicotine  21 mg Transdermal Daily  . potassium chloride  40 mEq Oral Once   Continuous Infusions: . famotidine (PEPCID) IV 20 mg (04/05/18 0839)  . 0.9 % sodium chloride with kcl     PRN Meds:.albuterol, hydrOXYzine, ondansetron (ZOFRAN) IV, traZODone  Antibiotics  :   Anti-infectives (From admission, onward)   None          Objective:   Vitals:   04/04/18 1217 04/04/18 1657 04/04/18 2149 04/05/18 0515  BP: (!) 137/92 133/76 (!) 148/87 (!) 115/59  Pulse: (!) 112 (!) 110 (!) 106 72  Resp: (!) 22 (!) 25 20 16   Temp:  98.9 F (37.2 C) 97.8 F (36.6 C)   TempSrc:   Oral   SpO2: 100% 100% 97% 100%    Wt Readings from Last 3 Encounters:  03/22/18 99.8 kg (97 %, Z= 1.91)*  08/14/17 72.6 kg (65 %, Z= 0.38)*  08/11/17 72.6 kg (65 %, Z= 0.39)*   * Growth  percentiles are based on CDC (Boys, 2-20 Years) data.     Intake/Output Summary (Last 24 hours) at 04/05/2018 0952 Last data filed at 04/05/2018 0514 Gross per 24 hour  Intake 3792.99 ml  Output -  Net 3792.99 ml     Physical Exam  Awake Alert, Oriented X 3, No new F.N deficits, agitated affect [place].AT,PERRAL Supple Neck,No JVD, No cervical lymphadenopathy appriciated.  Symmetrical Chest wall movement, Good air movement bilaterally, CTAB RRR,No Gallops,Rubs or new Murmurs, No Parasternal Heave +ve B.Sounds, Abd Soft, No tenderness, No organomegaly appriciated, No rebound - guarding or rigidity. No Cyanosis, Clubbing or edema, No new Rash or bruise       Data Review:    CBC Recent Labs  Lab 04/02/18 0920 04/04/18 0425 04/04/18 1525 04/05/18 0558  WBC 9.3 12.1* 12.4* 8.9  HGB 15.1 16.3 14.8 15.1  HCT 46.5 47.8 43.9 43.4  PLT 285 348 301 267  MCV 84.4 82.1 82.2 80.2  MCH 27.4 28.0 27.7 27.9  MCHC 32.5 34.1 33.7 34.8  RDW 12.2 12.4 12.7 12.7    Chemistries  Recent Labs  Lab 04/02/18 0920 04/02/18 1526 04/04/18 0425 04/04/18 1525 04/05/18 0558  NA 129* 134* 129* 133* 136  K 4.9 4.0 4.4 3.5 2.8*  CL 93* 101 95* 107 110  CO2 16* 20* 9* 11* 18*  GLUCOSE 665* 329* 498* 330* 128*  BUN 12 10 11  5* <5*  CREATININE 1.27* 0.92 1.48* 1.11 0.73  CALCIUM 9.2 9.2 9.0 7.7* 8.4*  MG  --   --   --  1.8 2.0   ------------------------------------------------------------------------------------------------------------------ No results for input(s): CHOL, HDL, LDLCALC, TRIG, CHOLHDL, LDLDIRECT in the last 72 hours.  Lab Results  Component Value Date   HGBA1C 10.8 (H) 04/05/2018   ------------------------------------------------------------------------------------------------------------------ No results for input(s): TSH, T4TOTAL, T3FREE, THYROIDAB in the last 72 hours.  Invalid input(s):  FREET3 ------------------------------------------------------------------------------------------------------------------ No results for input(s): VITAMINB12, FOLATE, FERRITIN, TIBC, IRON, RETICCTPCT in the last 72 hours.  Coagulation profile No results for input(s): INR, PROTIME in the last 168 hours.  No results for input(s): DDIMER in the last 72 hours.  Cardiac Enzymes No results for input(s): CKMB, TROPONINI, MYOGLOBIN in the last 168 hours.  Invalid input(s): CK ------------------------------------------------------------------------------------------------------------------ No results found for: BNP  Micro Results Recent Results (from the past 240 hour(s))  Culture, blood (routine x 2)     Status: None (Preliminary result)   Collection Time: 04/04/18  3:38 PM  Result Value Ref Range Status   Specimen Description BLOOD LEFT ANTECUBITAL  Final   Special Requests   Final    BOTTLES DRAWN AEROBIC AND ANAEROBIC Blood Culture adequate volume   Culture   Final    NO GROWTH < 24 HOURS Performed at [facility], [address] [place], [place] [address]    Report Status PENDING  Incomplete  Culture, blood (routine x 2)     Status: None (Preliminary result)   Collection Time: 04/04/18  3:39 PM  Result Value Ref Range Status   Specimen Description BLOOD LEFT ANTECUBITAL  Final   Special Requests   Final    BOTTLES DRAWN AEROBIC AND ANAEROBIC Blood Culture adequate volume   Culture   Final    NO GROWTH < 24 HOURS Performed at [facility], [address] [place], [place] [address]    Report Status PENDING  Incomplete    Radiology Reports Dg Chest 2 View  Result Date: 03/22/2018 CLINICAL DATA:  Cough EXAM: CHEST - 2 VIEW COMPARISON:  07/26/2017 chest radiograph. FINDINGS: Stable cardiomediastinal silhouette with normal heart size. No pneumothorax. No pleural effusion. Lungs appear clear, with no acute consolidative airspace disease and no pulmonary edema.  IMPRESSION: No active cardiopulmonary disease. Electronically Signed   By: Delbert Phenix M.D.   On: 03/22/2018 19:48    Time Spent in minutes  30   Susa Raring M.D on 04/05/2018 at 9:52 AM  To page go to [email or web address] - password [facility]

## 2018-04-05 NOTE — Progress Notes (Signed)
CSW received call from [facility] Coordination team through [place]. CSW was informed that they are scheduled to have a meeting today regarding pt. CSW to update unit CSW of this as Dahlia Client to follow back up with her.   Claude Manges Ahni Bradwell, MSW, LCSW-A Emergency Department Clinical Social Worker [phone]

## 2018-04-05 NOTE — Progress Notes (Signed)
Patient continues to ask to leave, states he will leave AMA. Patient notified that being able to ambulate unassisted was one of his goals for d/c. Multiple attempts made to ambulate patient and he is unable to stand unassisted. With walker patient displays significant swaying and needs maximum assistance to keep from falling. Bed alarm/chair alarm being utilized  Patient stated he needs a Child psychotherapist due to being homeless and he also expressed a desire for help with his substance abuse issues. Social worker consult placed. Will continue to monitor. Asher Muir Lavaris Sexson,RN

## 2018-04-05 NOTE — Progress Notes (Signed)
Pt ripped out IV and stated he was leaving. Multiple staff members tried to educate against this action, Pt stated he was going to start punching people. Patient had two male friends at the bedside who encouraged him to stay. Pt continued to state he was leaving. Pt refused to sign AMA papers.  Security called and escorted patient off the unit. Text page sent to the night APP. Asher Muir Anise Harbin,RN

## 2018-04-06 NOTE — Discharge Summary (Signed)
AMA  Patient left AMA around 7.15pm last night , patient has been warned that this is not Medically advisable at this time, and can result in Medical complications like Death and Disability, patient understands and accepts the risks involved and assumes full responsibilty of this decision.  Message left for [facility] staff Dr Yetta Barre on her listed number below on 04/06/18 7.25am   Susa Raring M.D on 04/06/2018 at 7:22 AM  [facility]  Time < 30 minutes  Last Note Below               PROGRESS NOTE                                                                                                                                                                                                             Patient Demographics:    [name] [name], is a 17 y.o. male, DOB - [birth year], XBJ:[id number]  Admit date - 04/04/2018   Admitting Physician Myrtie Neither, MD  Outpatient Primary MD for the patient is System, Pcp Not In  LOS - 1  Chief Complaint  Patient presents with  . Diabetic Ketoacidosis       Brief Narrative  [name] [name]. is a 17 y.o. male with medical history significant for 17 year old male with past medical history of type 1 diabetes mellitus cocaine and other substance abuse, he was seen but he signed out AGAINST MEDICAL ADVICE stating and needs family emergency he returns this morning with complaints of still being DKA and feels worse than yesterday.   Subjective:    Margette Fast today has, No headache, No chest pain, No abdominal pain - No Nausea, No new weakness tingling or numbness, No Cough - SOB.     Assessment  & Plan :     1.  DKA in a patient with type 1 diabetes mellitus who is insulin-dependent and noncompliant.  Signed out AMA few days ago for similar problem.  DKA has resolved, he has been hydrated,  transition to Lantus and sliding scale, A1c is 10.5 suggesting poor outpatient control.  Counseled on compliance.  Advance diet and monitor.  2.  History of anxiety and depression.  He is currently not suicidal  homicidal and for now cooperating with the staff, overnight was belligerent, counseled, he has good insight, continue Fluoxetine & Thorazine at home dose along recommend outpatient psych and PCP follow-up and monitoring closely.   3.15 pm - Received a 2 Calls from Dr Renato Gails from [place] and Dr Yetta Barre [facility] patient's psychologist - requesting Psych eval while he is here, they think he might be danger to himself or others. Dr Yetta Barre can be reached at [phone]. Psych consult called to Dr [name] by me at 3.40 pm.   3.  Hypokalemia.  Replace and monitor.  4.  Generalized weakness, hypokalemia along with peripheral neuropathy.  PT eval and monitor. Check TSH-B12.  5.  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  Counseled to quit all.    Family Communication  :  None  Code Status :  Full  Disposition Plan  :  Home 1-2 days  Consults  :  None  Procedures  :    DVT Prophylaxis  :  Lovenox    Lab Results  Component Value Date   PLT 267 04/05/2018    Diet :  Diet Order    None       Inpatient Medications Scheduled Meds:  Continuous Infusions:  PRN Meds:.  Antibiotics  :   Anti-infectives (From admission, onward)   None          Objective:   Vitals:   04/04/18 1657 04/04/18 2149 04/05/18 0515 04/05/18 1736  BP: 133/76 (!) 148/87 (!) 115/59 108/64  Pulse: (!) 110 (!) 106 72 99  Resp: (!) 25 20 16 18   Temp: 98.9 F (37.2 C) 97.8 F (36.6 C)  97.7 F (36.5 C)  TempSrc:  Oral  Axillary  SpO2: 100% 97% 100% 98%    Wt Readings from Last 3 Encounters:  03/22/18 99.8 kg (97 %, Z= 1.91)*  08/14/17 72.6 kg (65 %, Z= 0.38)*  08/11/17 72.6 kg (65 %, Z= 0.39)*   * Growth percentiles are based on CDC (Boys, 2-20 Years) data.      Intake/Output Summary (Last 24 hours) at 04/06/2018 0724 Last data filed at 04/05/2018 1753 Gross per 24 hour  Intake 1016.22 ml  Output 2150 ml  Net -1133.78 ml     Physical Exam  Awake Alert, Oriented X 3, No new F.N deficits, agitated affect [place].AT,PERRAL Supple Neck,No JVD, No cervical lymphadenopathy appriciated.  Symmetrical Chest wall movement, Good air movement bilaterally, CTAB RRR,No Gallops,Rubs or new Murmurs, No Parasternal Heave +ve B.Sounds, Abd Soft, No tenderness, No organomegaly appriciated, No rebound - guarding or rigidity. No Cyanosis, Clubbing or edema, No new Rash or bruise       Data Review:    CBC Recent Labs  Lab 04/02/18 0920 04/04/18 0425 04/04/18 1525 04/05/18 0558  WBC 9.3 12.1* 12.4* 8.9  HGB 15.1 16.3 14.8 15.1  HCT 46.5 47.8 43.9 43.4  PLT 285 348 301 267  MCV 84.4 82.1 82.2 80.2  MCH 27.4 28.0 27.7 27.9  MCHC 32.5 34.1 33.7 34.8  RDW 12.2 12.4 12.7 12.7    Chemistries  Recent Labs  Lab 04/02/18 0920 04/02/18 1526 04/04/18 0425 04/04/18 1525 04/05/18 0558  NA 129* 134* 129* 133* 136  K 4.9 4.0 4.4 3.5 2.8*  CL 93* 101 95* 107 110  CO2 16* 20* 9* 11* 18*  GLUCOSE 665* 329* 498* 330* 128*  BUN 12 10 11  5* <5*  CREATININE 1.27* 0.92 1.48* 1.11 0.73  CALCIUM 9.2 9.2 9.0 7.7* 8.4*  MG  --   --   --  1.8 2.0   ------------------------------------------------------------------------------------------------------------------ No results for input(s): CHOL, HDL, LDLCALC, TRIG, CHOLHDL, LDLDIRECT in the last 72 hours.  Lab Results  Component Value Date   HGBA1C 10.8 (H) 04/05/2018   ------------------------------------------------------------------------------------------------------------------ Recent Labs    04/05/18 1609  TSH 1.584   ------------------------------------------------------------------------------------------------------------------ Recent Labs    04/05/18 1609  VITAMINB12 435    Coagulation  profile No results for input(s): INR, PROTIME in the last 168 hours.  No results for input(s): DDIMER in the last 72 hours.  Cardiac Enzymes No results for input(s): CKMB, TROPONINI, MYOGLOBIN in the last 168 hours.  Invalid input(s): CK ------------------------------------------------------------------------------------------------------------------ No results found for: BNP  Micro Results Recent Results (from the past 240 hour(s))  Culture, blood (routine x 2)     Status: None (Preliminary result)   Collection Time: 04/04/18  3:38 PM  Result Value Ref Range Status   Specimen Description BLOOD LEFT ANTECUBITAL  Final   Special Requests   Final    BOTTLES DRAWN AEROBIC AND ANAEROBIC Blood Culture adequate volume   Culture  Setup Time   Final    GRAM POSITIVE COCCI IN CHAINS IN BOTH AEROBIC AND ANAEROBIC BOTTLES CRITICAL RESULT CALLED TO, READ BACK BY AND VERIFIED WITH: Crecencio Mc PHARMD, AT 1204 04/05/18 BY Renato Shin Performed at [facility], [address], [place], [place] [address]    Culture GRAM POSITIVE COCCI  Final   Report Status PENDING  Incomplete  Blood Culture ID Panel (Reflexed)     Status: Abnormal   Collection Time: 04/04/18  3:38 PM  Result Value Ref Range Status   Enterococcus species NOT DETECTED NOT DETECTED Final   Listeria monocytogenes NOT DETECTED NOT DETECTED Final   Staphylococcus species NOT DETECTED NOT DETECTED Final   Staphylococcus aureus (BCID) NOT DETECTED NOT DETECTED Final   Streptococcus species DETECTED (A) NOT DETECTED Final    Comment: Not Enterococcus species, Streptococcus agalactiae, Streptococcus pyogenes, or Streptococcus pneumoniae. CRITICAL RESULT CALLED TO, READ BACK BY AND VERIFIED WITH: Crecencio Mc PHARMD, AT 1204 04/05/18 BY D. VANHOOK    Streptococcus agalactiae NOT DETECTED NOT DETECTED Final   Streptococcus pneumoniae NOT DETECTED NOT DETECTED Final   Streptococcus pyogenes NOT DETECTED NOT DETECTED Final    Acinetobacter baumannii NOT DETECTED NOT DETECTED Final   Enterobacteriaceae species NOT DETECTED NOT DETECTED Final   Enterobacter cloacae complex NOT DETECTED NOT DETECTED Final   Escherichia coli NOT DETECTED NOT DETECTED Final   Klebsiella oxytoca NOT DETECTED NOT DETECTED Final   Klebsiella pneumoniae NOT DETECTED NOT DETECTED Final   Proteus species NOT DETECTED NOT DETECTED Final   Serratia marcescens NOT DETECTED NOT DETECTED Final   Haemophilus influenzae NOT DETECTED NOT DETECTED Final   Neisseria meningitidis NOT DETECTED NOT DETECTED Final   Pseudomonas aeruginosa NOT DETECTED NOT DETECTED Final   Candida albicans NOT DETECTED NOT DETECTED Final   Candida glabrata NOT DETECTED NOT DETECTED Final   Candida krusei NOT DETECTED NOT DETECTED Final   Candida parapsilosis NOT DETECTED NOT DETECTED Final   Candida tropicalis NOT DETECTED NOT DETECTED Final    Comment: Performed at [facility], [address] [place], [place] [address]  Culture, blood (routine x 2)     Status: None (Preliminary result)   Collection Time: 04/04/18  3:39 PM  Result Value Ref Range Status   Specimen Description BLOOD LEFT ANTECUBITAL  Final   Special Requests   Final    BOTTLES DRAWN AEROBIC AND ANAEROBIC Blood Culture adequate  volume   Culture   Final    NO GROWTH < 24 HOURS Performed at [facility], [address] [place], [place] [address]    Report Status PENDING  Incomplete    Radiology Reports Dg Chest 2 View  Result Date: 03/22/2018 CLINICAL DATA:  Cough EXAM: CHEST - 2 VIEW COMPARISON:  07/26/2017 chest radiograph. FINDINGS: Stable cardiomediastinal silhouette with normal heart size. No pneumothorax. No pleural effusion. Lungs appear clear, with no acute consolidative airspace disease and no pulmonary edema. IMPRESSION: No active cardiopulmonary disease. Electronically Signed   By: Delbert Phenix M.D.   On: 03/22/2018 19:48    Time Spent in minutes  30   Susa Raring  M.D on 04/06/2018 at 7:24 AM  To page go to [email or web address] - password [facility]

## 2018-04-07 LAB — CULTURE, BLOOD (ROUTINE X 2): SPECIAL REQUESTS: ADEQUATE

## 2018-04-09 LAB — CULTURE, BLOOD (ROUTINE X 2)
Culture: NO GROWTH
SPECIAL REQUESTS: ADEQUATE

## 2018-04-12 ENCOUNTER — Emergency Department (HOSPITAL_COMMUNITY)
Admission: EM | Admit: 2018-04-12 | Discharge: 2018-04-12 | Discharge status: Against Medical Advice | Payer: Medicaid Other | Attending: Emergency Medicine | Admitting: Emergency Medicine

## 2018-04-12 ENCOUNTER — Encounter (HOSPITAL_COMMUNITY): Payer: Self-pay | Admitting: Emergency Medicine

## 2018-04-12 ENCOUNTER — Other Ambulatory Visit: Payer: Self-pay

## 2018-04-12 DIAGNOSIS — Z79899 Other long term (current) drug therapy: Secondary | ICD-10-CM | POA: Insufficient documentation

## 2018-04-12 DIAGNOSIS — E1065 Type 1 diabetes mellitus with hyperglycemia: Secondary | ICD-10-CM | POA: Insufficient documentation

## 2018-04-12 DIAGNOSIS — Z7722 Contact with and (suspected) exposure to environmental tobacco smoke (acute) (chronic): Secondary | ICD-10-CM | POA: Diagnosis not present

## 2018-04-12 DIAGNOSIS — J45909 Unspecified asthma, uncomplicated: Secondary | ICD-10-CM | POA: Diagnosis not present

## 2018-04-12 DIAGNOSIS — R739 Hyperglycemia, unspecified: Secondary | ICD-10-CM

## 2018-04-12 LAB — BASIC METABOLIC PANEL
Anion gap: 12 (ref 5–15)
BUN: 17 mg/dL (ref 6–20)
CO2: 21 mmol/L — ABNORMAL LOW (ref 22–32)
Calcium: 9.6 mg/dL (ref 8.9–10.3)
Chloride: 89 mmol/L — ABNORMAL LOW (ref 98–111)
Creatinine, Ser: 1.08 mg/dL (ref 0.61–1.24)
GFR calc Af Amer: >60 mL/min (ref >60)
GLUCOSE: 903 mg/dL — AB (ref 70–99)
POTASSIUM: 5.6 mmol/L — AB (ref 3.5–5.1)
Sodium: 122 mmol/L — ABNORMAL LOW (ref 135–145)

## 2018-04-12 LAB — CBG MONITORING, ED

## 2018-04-12 LAB — CBC
HCT: 44.5 % (ref 39.0–52.0)
HEMOGLOBIN: 15.1 g/dL (ref 13.0–17.0)
MCH: 28 pg (ref 26.0–34.0)
MCHC: 33.9 g/dL (ref 30.0–36.0)
MCV: 82.4 fL (ref 80.0–100.0)
NRBC: 0 % (ref 0.0–0.2)
Platelets: 291 10*3/uL (ref 150–400)
RBC: 5.4 MIL/uL (ref 4.22–5.81)
RDW: 13.2 % (ref 11.5–15.5)
WBC: 6.4 10*3/uL (ref 4.0–10.5)

## 2018-04-12 LAB — URINALYSIS, ROUTINE W REFLEX MICROSCOPIC
BACTERIA UA: NONE SEEN
Bilirubin Urine: NEGATIVE
Glucose, UA: >=500 mg/dL — AB
Hgb urine dipstick: NEGATIVE
KETONES UR: 20 mg/dL — AB
LEUKOCYTES UA: NEGATIVE
NITRITE: NEGATIVE
PROTEIN: NEGATIVE mg/dL
Specific Gravity, Urine: 1.018 (ref 1.005–1.030)
pH: 6 (ref 5.0–8.0)

## 2018-04-12 MED ORDER — SODIUM CHLORIDE 0.9 % IV SOLN: INTRAVENOUS | Status: DC

## 2018-04-12 MED ORDER — SODIUM CHLORIDE 0.9 % IV BOLUS: 1000.0000 mL | Freq: Once | INTRAVENOUS | Status: DC

## 2018-04-12 MED ORDER — DEXTROSE-NACL 5-0.45 % IV SOLN: INTRAVENOUS | Status: DC

## 2018-04-12 MED ORDER — INSULIN REGULAR(HUMAN) IN NACL 100-0.9 UT/100ML-% IV SOLN: INTRAVENOUS | Status: DC

## 2018-04-12 NOTE — ED Notes (Signed)
Pt just walked out of triage doors. Tried to talk to Pt but Pt kept walking.

## 2018-04-12 NOTE — ED Notes (Signed)
Pt stated he "found a cookie" and he hasn't eaten in 3 days so he is going to eat the cookie.

## 2018-04-12 NOTE — ED Notes (Signed)
Pt was arguing w/ mother on phone who told him to leave so she could take him to another hospital.  RN attempted to convince pt to stay, informed provider.  Pt stood up to leave.  RN was able to get pt to stay until IV was taken out.  Pt left.

## 2018-04-12 NOTE — ED Triage Notes (Signed)
Pt Type 1 DM, unable to get insulin. Pt's CBG reads high. Pt received 500 ml of NS. Pt denies any other complaints.

## 2018-04-12 NOTE — ED Provider Notes (Signed)
[facility] EMERGENCY DEPARTMENT Provider Note   CSN: [id number] Arrival date & time: 04/12/18  4098     History   Chief Complaint Chief Complaint  Patient presents with  . Hyperglycemia    HPI [name] [name]. is a 17 y.o. male.  Patient presents to the emergency department with a chief complaint of hyperglycemia.  He is a type I diabetic.  He is insulin controlled.  He reports that he does not have his insulin because his Medicaid got cut off, and he cannot afford it.  He reports that he has not had anything for the past several days.  Denies having had anything to eat in the last 3 days, and reports that his blood sugar continues to climb.  He reports that he did have an episode of vomiting this morning, but states that it was an unrelated an isolated incident involving dog feces in his mouth, which he did not elaborate on.  He denies any fevers or chills.  He denies any other associated symptoms.  The history is provided by the patient. No language interpreter was used.    Past Medical History:  Diagnosis Date  . ADD (attention deficit disorder)   . Allergy   . Anxiety   . Asthma   . Depression   . Epileptic seizure (HCC)    "both petit and grand mal; last sz ~ 2 wk ago" (06/17/2013)  . Headache(784.0)    "2-3 times/wk usually" (06/17/2013)  . Heart murmur    "heard a slight one earlier today" (06/17/2013)  . Migraine    "maybe once/month; it's severe" (06/17/2013)  . ODD (oppositional defiant disorder)   . Sickle cell trait (HCC)   . Type I diabetes mellitus (HCC)    "that's what they're thinking now" (06/17/2013)  . Vision abnormalities    "takes him longer to focus cause; from his sz" (06/17/2013)    Patient Active Problem List   Diagnosis Date Noted  . Borderline personality disorder (HCC) 02/26/2018  . 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 Major depressive disorder, recurrent severe  without psychotic features (HCC) 07/24/2017  . DKA, type 1 (HCC) 07/16/2017  . Nausea & vomiting 07/16/2017  . Uncontrolled type 1 diabetes circulatory disorder erectile dysfunction (HCC)   . DKA (diabetic ketoacidoses) (HCC) 12/18/2016  . History of seizures 12/01/2016  . History of migraine 12/01/2016  . Disordered eating 03/08/2015  . Ketonuria   . Adjustment reaction to medical therapy   . Non compliance w medication regimen   . 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 Dehydration 08/22/2014  . Hyperglycemia due to type 1 diabetes mellitus (HCC) 08/21/2014  . Type 1 diabetes mellitus with hyperglycemia (HCC)   . Noncompliance   . Generalized abdominal pain   . Glycosuria   . Depression 07/12/2014  . Involuntary movements 06/13/2014  . Bilateral leg pain 06/13/2014  . 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 Suicidal ideation 03/31/2014  . ODD (oppositional defiant disorder) 03/03/2014  . MDD (major depressive disorder), recurrent episode, moderate (HCC) 02/16/2014  . Attention deficit hyperactivity disorder (ADHD), combined type, moderate 02/16/2014  . Microcytic anemia 02/15/2014  . Hyperglycemia 02/14/2014  . Goiter 02/04/2014  . 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 Acquired acanthosis nigricans 02/04/2014  . Obesity, morbid (HCC) 02/04/2014  . Insulin resistance 02/04/2014  . Hyperinsulinemia 02/04/2014  .  Hypoglycemia associated with diabetes (HCC) 02/02/2014  . Maladaptive health behaviors affecting medical condition 02/02/2014  . Hypoglycemia 02/02/2014  . Type 1 diabetes mellitus in patient 97 to 19 years of age with hemoglobin A1c goal of less than 7.5% (HCC) 01/26/2014  . 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  . 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 Migraine without aura  12/13/2013  . Body mass index, pediatric, greater than or equal to 95th percentile for age 01/12/2014  . Asthma 12/13/2013  . Hypoglycemia unawareness in type 1 diabetes mellitus (HCC) 08/08/2013  . Seizure disorder (HCC) 07/06/2013  . Short-term memory loss 07/06/2013  . Sickle cell trait (HCC) 07/06/2013  . Diabetes (HCC) 06/17/2013  . Diabetes mellitus, new onset (HCC) 06/17/2013    Past Surgical History:  Procedure Laterality Date  . CIRCUMCISION  2000  . FINGER SURGERY Left 2001   "crushed pinky; had to repair it" (06/17/2013)        Home Medications    Prior to Admission medications   Medication Sig Start Date End Date Taking? Authorizing Provider  albuterol (PROVENTIL HFA;VENTOLIN HFA) 108 (90 Base) MCG/ACT inhaler Inhale 2 puffs into the lungs every 6 (six) hours as needed for wheezing or shortness of breath. Patient not taking: Reported on 04/02/2018 03/09/18   Armandina Stammer I, NP  chlorproMAZINE (THORAZINE) 100 MG tablet Take 1 tablet (100 mg total) by mouth at bedtime. For mood control Patient taking differently: Take 100 mg by mouth daily.  03/09/18   Armandina Stammer I, NP  FLUoxetine (PROZAC) 20 MG capsule Take 1 capsule (20 mg total) by mouth daily. For depression 03/10/18   Armandina Stammer I, NP  Fluticasone Propionate HFA (FLOVENT HFA IN) Inhale 2 puffs into the lungs daily.    [provider]  glucose blood (ACCU-CHEK GUIDE) test strip Check glucose 6x daily: For blood sugar checks 03/22/18   Long, Arlyss Repress, MD  hydrOXYzine (ATARAX/VISTARIL) 50 MG tablet Take 1 tablet (50 mg total) by mouth every 6 (six) hours as needed for anxiety. 03/09/18   Armandina Stammer I, NP  insulin aspart (NOVOLOG FLEXPEN) 100 UNIT/ML FlexPen Inject 0-15 Units into the skin 3 (three) times daily with meals. (Sliding scale): For diabetes management 03/09/18   Armandina Stammer I, NP  insulin glargine (LANTUS) 100 UNIT/ML injection Inject 0.35 mLs (35 Units total) into the skin 2 (two) times daily.  03/21/18   Joy, Shawn C, PA-C  nicotine (NICODERM CQ - DOSED IN MG/24 HOURS) 21 mg/24hr patch Place 1 patch (21 mg total) onto the skin daily. (May purchase from over the counter): For smoking cessation 03/10/18   Armandina Stammer I, NP  traZODone (DESYREL) 100 MG tablet Take 1 tablet (100 mg total) by mouth at bedtime as needed for sleep. 03/09/18   Sanjuana Kava, NP    Family History Family History  Problem Relation Age of Onset  . Asthma Mother   . COPD Mother   . Other Mother        possible autoimmune, unclear  . Diabetes Maternal Grandmother   . Heart disease Maternal Grandmother   . Hypertension Maternal Grandmother   . Mental illness Maternal Grandmother   . Heart disease Maternal Grandfather   . Hyperlipidemia Paternal Grandmother   . Hyperlipidemia Paternal Grandfather   . Migraines Sister        Hemiplegic Migraines     Social History Social History   Tobacco Use  . Smoking status: Passive Smoke Exposure - Never  Smoker  . Smokeless tobacco: Never Used  . Tobacco comment: Mom and dad smoke outside   Substance Use Topics  . Alcohol use: No    Alcohol/week: 0.0 standard drinks  . Drug use: No     Allergies   Bee venom; Fish allergy; and Shellfish allergy   Review of Systems Review of Systems  All other systems reviewed and are negative.    Physical Exam Updated Vital Signs BP (!) 146/110 (BP Location: Right Arm)   Pulse (!) 101   Temp 98.2 F (36.8 C) (Oral)   Resp 18   SpO2 95%   Physical Exam  Constitutional: He is oriented to person, place, and time. He appears well-developed and well-nourished.  HENT:  Head: Normocephalic and atraumatic.  Eyes: Pupils are equal, round, and reactive to light. Conjunctivae and EOM are normal. Right eye exhibits no discharge. Left eye exhibits no discharge. No scleral icterus.  Neck: Normal range of motion. Neck supple. No JVD present.  Cardiovascular: Normal rate, regular rhythm and normal heart sounds. Exam reveals  no gallop and no friction rub.  No murmur heard. Pulmonary/Chest: Effort normal and breath sounds normal. No respiratory distress. He has no wheezes. He has no rales. He exhibits no tenderness.  Abdominal: Soft. He exhibits no distension and no mass. There is no tenderness. There is no rebound and no guarding.  Musculoskeletal: Normal range of motion. He exhibits no edema or tenderness.  Neurological: He is alert and oriented to person, place, and time.  Skin: Skin is warm and dry.  Psychiatric: He has a normal mood and affect. His behavior is normal. Judgment and thought content normal.  Nursing note and vitals reviewed.    ED Treatments / Results  Labs (all labs ordered are listed, but only abnormal results are displayed) Labs Reviewed  BASIC METABOLIC PANEL - Abnormal; Notable for the following components:      Result Value   Sodium 122 (*)    Potassium 5.6 (*)    Chloride 89 (*)    CO2 21 (*)    Glucose, Bld 903 (*)    All other components within normal limits  URINALYSIS, ROUTINE W REFLEX MICROSCOPIC - Abnormal; Notable for the following components:   Color, Urine COLORLESS (*)    Glucose, UA >=500 (*)    Ketones, ur 20 (*)    All other components within normal limits  CBG MONITORING, ED - Abnormal; Notable for the following components:   Glucose-Capillary >600 (*)    All other components within normal limits  CBC    EKG None  Radiology No results found.  Procedures Procedures (including critical care time) CRITICAL CARE  Medications Ordered in ED Medications  insulin regular, human (MYXREDLIN) 100 units/ 100 mL infusion (has no administration in time range)  sodium chloride 0.9 % bolus 1,000 mL (has no administration in time range)    And  sodium chloride 0.9 % bolus 1,000 mL (has no administration in time range)    And  sodium chloride 0.9 % bolus 1,000 mL (has no administration in time range)    And  0.9 %  sodium chloride infusion (has no administration  in time range)  dextrose 5 %-0.45 % sodium chloride infusion (has no administration in time range)     Initial Impression / Assessment and Plan / ED Course  I have reviewed the triage vital signs and the nursing notes.  Pertinent labs & imaging results that were available during my care of the  patient were reviewed by me and considered in my medical decision making (see chart for details).     Patient with type 1 diabetes.  He has been out of his insulin.  He has not had anything to eat for the past several days.  States that is blood sugars running very high.  Glucose is 903.  Will start fluids and glucose stabilizer.  He does not appear toxic, and anion gap is 12.  I did discuss with the patient that he will likely need to stay in the hospital given his severe hyperglycemia.  I was informed by RN that the patient's mother told him to leave and go to another hospital due to long wait time.  Patient encouraged to stay, care begun promptly upon getting a room in the ED.  Patient walked out against my advice.  Final Clinical Impressions(s) / ED Diagnoses   Final diagnoses:  Hyperglycemia    ED Discharge Orders    None       Roxy Horseman, PA-C 04/13/18 0044    Shon Baton, MD 04/13/18 [phone]

## 2018-04-14 ENCOUNTER — Encounter (HOSPITAL_COMMUNITY): Payer: Self-pay

## 2018-04-14 ENCOUNTER — Emergency Department (HOSPITAL_COMMUNITY)
Admission: EM | Admit: 2018-04-14 | Discharge: 2018-04-19 | Disposition: A | Discharge status: Home / Self Care | Payer: Medicaid Other | Attending: Emergency Medicine | Admitting: Emergency Medicine

## 2018-04-14 ENCOUNTER — Other Ambulatory Visit: Payer: Self-pay

## 2018-04-14 ENCOUNTER — Encounter: Payer: Self-pay | Admitting: Endocrinology

## 2018-04-14 DIAGNOSIS — Z7722 Contact with and (suspected) exposure to environmental tobacco smoke (acute) (chronic): Secondary | ICD-10-CM | POA: Insufficient documentation

## 2018-04-14 DIAGNOSIS — F900 Attention-deficit hyperactivity disorder, predominantly inattentive type: Secondary | ICD-10-CM | POA: Insufficient documentation

## 2018-04-14 DIAGNOSIS — F913 Oppositional defiant disorder: Secondary | ICD-10-CM | POA: Diagnosis present

## 2018-04-14 DIAGNOSIS — Z7689 Persons encountering health services in other specified circumstances: Secondary | ICD-10-CM

## 2018-04-14 DIAGNOSIS — E1065 Type 1 diabetes mellitus with hyperglycemia: Secondary | ICD-10-CM | POA: Diagnosis not present

## 2018-04-14 DIAGNOSIS — Z79899 Other long term (current) drug therapy: Secondary | ICD-10-CM | POA: Insufficient documentation

## 2018-04-14 DIAGNOSIS — R739 Hyperglycemia, unspecified: Secondary | ICD-10-CM

## 2018-04-14 HISTORY — DX: Homelessness: Z59.0

## 2018-04-14 HISTORY — DX: Homelessness unspecified: Z59.00

## 2018-04-14 LAB — COMPREHENSIVE METABOLIC PANEL
ALBUMIN: 3.9 g/dL (ref 3.5–5.0)
ALT: 20 U/L (ref 0–44)
AST: 25 U/L (ref 15–41)
Alkaline Phosphatase: 118 U/L (ref 38–126)
Anion gap: 9 (ref 5–15)
BILIRUBIN TOTAL: 1.7 mg/dL — AB (ref 0.3–1.2)
BUN: 12 mg/dL (ref 6–20)
CO2: 27 mmol/L (ref 22–32)
CREATININE: 0.86 mg/dL (ref 0.61–1.24)
Calcium: 8.7 mg/dL — ABNORMAL LOW (ref 8.9–10.3)
Chloride: 97 mmol/L — ABNORMAL LOW (ref 98–111)
GFR calc Af Amer: >60 mL/min (ref >60)
GLUCOSE: 397 mg/dL — AB (ref 70–99)
POTASSIUM: 3.6 mmol/L (ref 3.5–5.1)
Sodium: 133 mmol/L — ABNORMAL LOW (ref 135–145)
TOTAL PROTEIN: 6.7 g/dL (ref 6.5–8.1)

## 2018-04-14 LAB — RAPID URINE DRUG SCREEN, HOSP PERFORMED
Amphetamines: NOT DETECTED
Barbiturates: NOT DETECTED
Benzodiazepines: NOT DETECTED
Cocaine: NOT DETECTED
Opiates: NOT DETECTED
Tetrahydrocannabinol: NOT DETECTED

## 2018-04-14 LAB — URINALYSIS, ROUTINE W REFLEX MICROSCOPIC
Bacteria, UA: NONE SEEN
Bilirubin Urine: NEGATIVE
HGB URINE DIPSTICK: NEGATIVE
KETONES UR: NEGATIVE mg/dL
LEUKOCYTES UA: NEGATIVE
NITRITE: NEGATIVE
Protein, ur: NEGATIVE mg/dL
Specific Gravity, Urine: 1.024 (ref 1.005–1.030)
pH: 6 (ref 5.0–8.0)

## 2018-04-14 LAB — I-STAT VENOUS BLOOD GAS, ED
Acid-base deficit: 3 mmol/L — ABNORMAL HIGH (ref 0.0–2.0)
BICARBONATE: 22.7 mmol/L (ref 20.0–28.0)
O2 Saturation: 84 %
PO2 VEN: 52 mmHg — AB (ref 32.0–45.0)
TCO2: 24 mmol/L (ref 22–32)
pCO2, Ven: 42.4 mmHg — ABNORMAL LOW (ref 44.0–60.0)
pH, Ven: 7.337 (ref 7.250–7.430)

## 2018-04-14 LAB — CBG MONITORING, ED
Glucose-Capillary: 385 mg/dL — ABNORMAL HIGH (ref 70–99)
Glucose-Capillary: 355 mg/dL — ABNORMAL HIGH (ref 70–99)
Glucose-Capillary: 284 mg/dL — ABNORMAL HIGH (ref 70–99)
Glucose-Capillary: 216 mg/dL — ABNORMAL HIGH (ref 70–99)

## 2018-04-14 LAB — CBC WITH DIFFERENTIAL/PLATELET
Abs Immature Granulocytes: 0.08 10*3/uL — ABNORMAL HIGH (ref 0.00–0.07)
BASOS ABS: 0.1 10*3/uL (ref 0.0–0.1)
Basophils Relative: 1 %
EOS PCT: 3 %
Eosinophils Absolute: 0.2 10*3/uL (ref 0.0–0.5)
HEMATOCRIT: 45 % (ref 39.0–52.0)
HEMOGLOBIN: 15.2 g/dL (ref 13.0–17.0)
IMMATURE GRANULOCYTES: 1 %
LYMPHS ABS: 1.4 10*3/uL (ref 0.7–4.0)
LYMPHS PCT: 21 %
MCH: 28 pg (ref 26.0–34.0)
MCHC: 33.8 g/dL (ref 30.0–36.0)
MCV: 82.9 fL (ref 80.0–100.0)
Monocytes Absolute: 0.6 10*3/uL (ref 0.1–1.0)
Monocytes Relative: 9 %
NEUTROS PCT: 65 %
Neutro Abs: 4.4 10*3/uL (ref 1.7–7.7)
Platelets: 275 10*3/uL (ref 150–400)
RBC: 5.43 MIL/uL (ref 4.22–5.81)
RDW: 13.1 % (ref 11.5–15.5)
WBC: 6.8 10*3/uL (ref 4.0–10.5)
nRBC: 0 % (ref 0.0–0.2)

## 2018-04-14 LAB — ETHANOL

## 2018-04-14 LAB — BETA-HYDROXYBUTYRIC ACID: BETA-HYDROXYBUTYRIC ACID: 0.43 mmol/L — AB (ref 0.05–0.27)

## 2018-04-14 MED ORDER — INSULIN GLARGINE 100 UNIT/ML ~~LOC~~ SOLN
35.0000 [IU] | Freq: Two times a day (BID) | SUBCUTANEOUS | Status: DC
  Administered 2018-04-15 – 2018-04-19 (×9): 35 [IU] via SUBCUTANEOUS
  Filled 2018-04-14 (×12): qty 0.35

## 2018-04-14 MED ORDER — INSULIN ASPART 100 UNIT/ML ~~LOC~~ SOLN
0.0000 [IU] | Freq: Three times a day (TID) | SUBCUTANEOUS | Status: DC
  Administered 2018-04-15 (×2): 8 [IU] via SUBCUTANEOUS
  Administered 2018-04-15: 5 [IU] via SUBCUTANEOUS
  Administered 2018-04-16: 18 [IU] via SUBCUTANEOUS
  Administered 2018-04-16: 3 [IU] via SUBCUTANEOUS
  Administered 2018-04-17 – 2018-04-18 (×4): 5 [IU] via SUBCUTANEOUS
  Administered 2018-04-18: 8 [IU] via SUBCUTANEOUS
  Administered 2018-04-18: 2 [IU] via SUBCUTANEOUS
  Administered 2018-04-19 (×2): 8 [IU] via SUBCUTANEOUS
  Administered 2018-04-19: 2 [IU] via SUBCUTANEOUS
  Filled 2018-04-14 (×13): qty 1

## 2018-04-14 MED ORDER — CHLORPROMAZINE HCL 25 MG PO TABS
100.0000 mg | ORAL_TABLET | Freq: Every day | ORAL | Status: DC
  Administered 2018-04-15 – 2018-04-18 (×3): 100 mg via ORAL
  Filled 2018-04-14 (×5): qty 4

## 2018-04-14 MED ORDER — FLUOXETINE HCL 20 MG PO CAPS
20.0000 mg | ORAL_CAPSULE | Freq: Every day | ORAL | Status: DC
  Administered 2018-04-15 – 2018-04-19 (×5): 20 mg via ORAL
  Filled 2018-04-14 (×5): qty 1

## 2018-04-14 MED ORDER — SODIUM CHLORIDE 0.9 % IV BOLUS
1000.0000 mL | Freq: Once | INTRAVENOUS | Status: AC
  Administered 2018-04-14: 1000 mL via INTRAVENOUS

## 2018-04-14 MED ORDER — INSULIN ASPART 100 UNIT/ML ~~LOC~~ SOLN
8.0000 [IU] | Freq: Once | SUBCUTANEOUS | Status: AC
  Administered 2018-04-14: 8 [IU] via INTRAVENOUS
  Filled 2018-04-14: qty 1

## 2018-04-14 MED ORDER — INSULIN ASPART 100 UNIT/ML FLEXPEN: 0.0000 [IU] | PEN_INJECTOR | Freq: Three times a day (TID) | SUBCUTANEOUS | Status: DC

## 2018-04-14 MED ORDER — INSULIN ASPART 100 UNIT/ML ~~LOC~~ SOLN: 10.0000 [IU] | Freq: Once | SUBCUTANEOUS | Status: DC

## 2018-04-14 MED ORDER — INSULIN GLARGINE 100 UNIT/ML ~~LOC~~ SOLN
35.0000 [IU] | Freq: Two times a day (BID) | SUBCUTANEOUS | Status: DC
  Filled 2018-04-14: qty 0.35

## 2018-04-14 NOTE — ED Provider Notes (Signed)
Patient placed in Quick Look pathway, seen and evaluated   Chief Complaint: IVC  HPI:   Patient presents today by [employer] after being picked up at [facility]  He was IVC.  He states that [name] believes he is suicidal and homicidal.  He is here for medical clearance.  He reports that he is currently on parole for multiple charges including assault against government official, conspiracy to commit murder, threatening mass violence, having a weapon on educational property, along with many other charges.  He has a ankle bracelet on.  He reports that he has not had any insulin since he got out of jail.  He says that he only eats when he comes here.  ROS: No nausea or vomiting  Physical Exam:   Gen: No distress  Neuro: Awake and Alert  Skin: Warm    Focused Exam: Calm, resting comfortably.     Initiation of care has begun. The patient has been counseled on the process, plan, and necessity for staying for the completion/evaluation, and the remainder of the medical screening examination    [name] [name] 04/14/18 1712    Melene Plan, DO 04/14/18 2259

## 2018-04-14 NOTE — ED Notes (Signed)
RN confirmed pt may have sandwich with insulin administration.

## 2018-04-14 NOTE — ED Notes (Signed)
Pt given another Sprite Zero and informed he can't have anymore snacks for the time being.

## 2018-04-14 NOTE — Progress Notes (Signed)
Daytime CSW received phone call from [place], [phone]. Per [place], pt is on the priority waiting list for [facility]. Per [place], pt needs referral to be sent to [facility]. CSW will relay information to [facility] disposition.   Montine Circle, [facility]  [phone]

## 2018-04-14 NOTE — ED Provider Notes (Signed)
[facility] EMERGENCY DEPARTMENT Provider Note   CSN: [id number] Arrival date & time: 04/14/18  1656     History   Chief Complaint Chief Complaint  Patient presents with  . Mental Health Problem  . IVC    HPI Eduardo Honor. is a 17 y.o. male.  HPI   Patient is a 17 year old male with a history of ADD, anxiety/depression, epileptic seizure, ODD, sickle cell trait, insulin dependent diabetes, who presents the emergency department today for a psychiatric evaluation.  Patient rates that he was at [facility] prior to arrival for his typical therapy session when he states he was placed under IVC and brought to the emergency department by EMS.  He states that his psychiatrist has been trying to get him admitted to [facility].  He denies suicidal ideations.  When asked about homicidal ideations, initially patient states that "yes there are a lot of people that I would like to hurt ".  Later retracts this statement saying that he does not actually want to hurt anybody and that he changed his mind.  Denies any recent self-harm.  No SI or AVH.  Reports that he has not slept in the last 3 days after he did methamphetamines 3 days ago.  Also admits to marijuana use.  Denies alcohol use.  Patient states he does not understand why he is here.  Patient denying any medical complaints currently.  He was seen in the ED several days ago and diagnosed with DKA and left AMA.  States that at that time he was having some abdominal pain but this is since resolved.  He denies nausea, vomiting or any other symptoms at this time.  Into triage note patient stated "they think I am homicidal and suicidal but I do not really think I am, I only have suicidal thoughts and he will ask me about it ".  Past Medical History:  Diagnosis Date  . ADD (attention deficit disorder)   . Allergy   . Anxiety   . Asthma   . Depression   . Epileptic seizure (HCC)    "both petit and grand mal;  last sz ~ 2 wk ago" (06/17/2013)  . Headache(784.0)    "2-3 times/wk usually" (06/17/2013)  . Heart murmur    "heard a slight one earlier today" (06/17/2013)  . Homeless   . Migraine    "maybe once/month; it's severe" (06/17/2013)  . ODD (oppositional defiant disorder)   . Sickle cell trait (HCC)   . Type I diabetes mellitus (HCC)    "that's what they're thinking now" (06/17/2013)  . Vision abnormalities    "takes him longer to focus cause; from his sz" (06/17/2013)    Patient Active Problem List   Diagnosis Date Noted  . Borderline personality disorder (HCC) 02/26/2018  . 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 DKA, type 1 (HCC) 07/16/2017  . Nausea & vomiting 07/16/2017  . Uncontrolled type 1 diabetes circulatory disorder erectile dysfunction (HCC)   . DKA (diabetic ketoacidoses) (HCC) 12/18/2016  . History of seizures 12/01/2016  . History of migraine 12/01/2016  . Disordered eating 03/08/2015  . Ketonuria   . Adjustment reaction to medical therapy   . Non compliance w medication regimen   . 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 Dehydration 08/22/2014  . Hyperglycemia due to type 1 diabetes mellitus (  HCC) 08/21/2014  . Type 1 diabetes mellitus with hyperglycemia (HCC)   . Noncompliance   . Generalized abdominal pain   . Glycosuria   . Depression 07/12/2014  . Involuntary movements 06/13/2014  . Bilateral leg pain 06/13/2014  . 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 Suicidal ideation 03/31/2014  . ODD (oppositional defiant disorder) 03/03/2014  . MDD (major depressive disorder), recurrent episode, moderate (HCC) 02/16/2014  . Attention deficit hyperactivity disorder (ADHD), combined type, moderate 02/16/2014  . Microcytic  anemia 02/15/2014  . Hyperglycemia 02/14/2014  . Goiter 02/04/2014  . 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 Acquired acanthosis nigricans 02/04/2014  . Obesity, morbid (HCC) 02/04/2014  . Insulin resistance 02/04/2014  . Hyperinsulinemia 02/04/2014  . Hypoglycemia associated with diabetes (HCC) 02/02/2014  . Maladaptive health behaviors affecting medical condition 02/02/2014  . Hypoglycemia 02/02/2014  . Type 1 diabetes mellitus in patient 42 to 19 years of age with hemoglobin A1c goal of less than 7.5% (HCC) 01/26/2014  . 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  . 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 Migraine without aura 12/13/2013  . Body mass index, pediatric, greater than or equal to 95th percentile for age 39/16/2015  . Asthma 12/13/2013  . Hypoglycemia unawareness in type 1 diabetes mellitus (HCC) 08/08/2013  . Seizure disorder (HCC) 07/06/2013  . Short-term memory loss 07/06/2013  . Sickle cell trait (HCC) 07/06/2013  . Diabetes (HCC) 06/17/2013  . Diabetes mellitus, new onset (HCC) 06/17/2013    Past Surgical History:  Procedure Laterality Date  . CIRCUMCISION  2000  . FINGER SURGERY Left 2001   "crushed pinky; had to repair it" (06/17/2013)        Home Medications    Prior to Admission medications   Medication Sig Start Date End Date Taking? Authorizing Provider  chlorproMAZINE (THORAZINE) 100 MG tablet Take 1 tablet (100 mg total) by mouth at bedtime. For mood control Patient taking differently: Take 100 mg by mouth daily.  03/09/18  Yes Armandina Stammer I, NP  FLUoxetine (PROZAC) 20 MG capsule Take 1 capsule (20 mg total) by mouth daily. For depression 03/10/18  Yes Armandina Stammer I, NP  hydrOXYzine (ATARAX/VISTARIL) 50 MG tablet Take 1 tablet (50 mg total) by mouth every 6 (six) hours as needed for anxiety. 03/09/18  Yes Nwoko, Nicole Kindred I, NP  insulin aspart (NOVOLOG FLEXPEN) 100 UNIT/ML  FlexPen Inject 0-15 Units into the skin 3 (three) times daily with meals. (Sliding scale): For diabetes management 03/09/18  Yes Armandina Stammer I, NP  insulin glargine (LANTUS) 100 UNIT/ML injection Inject 0.35 mLs (35 Units total) into the skin 2 (two) times daily. 03/21/18  Yes Joy, Shawn C, PA-C  albuterol (PROVENTIL HFA;VENTOLIN HFA) 108 (90 Base) MCG/ACT inhaler Inhale 2 puffs into the lungs every 6 (six) hours as needed for wheezing or shortness of breath. Patient not taking: Reported on 04/02/2018 03/09/18   Armandina Stammer I, NP  Fluticasone Propionate HFA (FLOVENT HFA IN) Inhale 2 puffs into the lungs daily.    [provider]  glucose blood (ACCU-CHEK GUIDE) test strip Check glucose 6x daily: For blood sugar checks 03/22/18   Long, Arlyss Repress, MD  nicotine (NICODERM CQ - DOSED IN MG/24 HOURS) 21 mg/24hr patch Place 1 patch (21 mg total) onto the skin daily. (May purchase from over the counter): For smoking cessation Patient not taking: Reported on 04/14/2018 03/10/18   Armandina Stammer I, NP  traZODone (  DESYREL) 100 MG tablet Take 1 tablet (100 mg total) by mouth at bedtime as needed for sleep. Patient not taking: Reported on 04/14/2018 03/09/18   Sanjuana Kava, NP    Family History Family History  Problem Relation Age of Onset  . Asthma Mother   . COPD Mother   . Other Mother        possible autoimmune, unclear  . Diabetes Maternal Grandmother   . Heart disease Maternal Grandmother   . Hypertension Maternal Grandmother   . Mental illness Maternal Grandmother   . Heart disease Maternal Grandfather   . Hyperlipidemia Paternal Grandmother   . Hyperlipidemia Paternal Grandfather   . Migraines Sister        Hemiplegic Migraines     Social History Social History   Tobacco Use  . Smoking status: Passive Smoke Exposure - Never Smoker  . Smokeless tobacco: Never Used  . Tobacco comment: Mom and dad smoke outside   Substance Use Topics  . Alcohol use: No    Alcohol/week: 0.0 standard  drinks  . Drug use: No     Allergies   Bee venom; Fish allergy; and Shellfish allergy   Review of Systems Review of Systems  Constitutional: Negative for chills and fever.  HENT: Negative for ear pain and sore throat.   Eyes: Negative for pain and visual disturbance.  Respiratory: Negative for cough and shortness of breath.   Cardiovascular: Negative for chest pain and palpitations.  Gastrointestinal: Negative for abdominal pain and vomiting.  Genitourinary: Negative for dysuria and hematuria.  Musculoskeletal: Negative for arthralgias and back pain.  Skin: Negative for color change and rash.  Neurological: Negative for seizures and syncope.  Psychiatric/Behavioral: Positive for sleep disturbance. Negative for hallucinations, self-injury and suicidal ideas.       No HI  All other systems reviewed and are negative.    Physical Exam Updated Vital Signs BP 129/88   Pulse 87   Temp 98.7 F (37.1 C) (Oral)   Resp 20   SpO2 95%   Physical Exam  Constitutional: He appears well-developed and well-nourished. No distress.  HENT:  Head: Normocephalic and atraumatic.  Eyes: Conjunctivae are normal.  Neck: Neck supple.  Cardiovascular: Normal rate and regular rhythm.  Pulmonary/Chest: Effort normal and breath sounds normal. No respiratory distress.  Abdominal: Soft. Bowel sounds are normal. There is no tenderness.  Musculoskeletal: He exhibits no edema.  Neurological: He is alert.  Skin: Skin is warm and dry. Capillary refill takes less than 2 seconds.  Psychiatric: He has a normal mood and affect.  Nursing note and vitals reviewed.  ED Treatments / Results  Labs (all labs ordered are listed, but only abnormal results are displayed) Labs Reviewed  COMPREHENSIVE METABOLIC PANEL - Abnormal; Notable for the following components:      Result Value   Sodium 133 (*)    Chloride 97 (*)    Glucose, Bld 397 (*)    Calcium 8.7 (*)    Total Bilirubin 1.7 (*)    All other  components within normal limits  CBC WITH DIFFERENTIAL/PLATELET - Abnormal; Notable for the following components:   Abs Immature Granulocytes 0.08 (*)    All other components within normal limits  URINALYSIS, ROUTINE W REFLEX MICROSCOPIC - Abnormal; Notable for the following components:   Color, Urine STRAW (*)    Glucose, UA >=500 (*)    All other components within normal limits  BETA-HYDROXYBUTYRIC ACID - Abnormal; Notable for the following components:   Beta-Hydroxybutyric  Acid 0.43 (*)    All other components within normal limits  CBG MONITORING, ED - Abnormal; Notable for the following components:   Glucose-Capillary 385 (*)    All other components within normal limits  CBG MONITORING, ED - Abnormal; Notable for the following components:   Glucose-Capillary 355 (*)    All other components within normal limits  CBG MONITORING, ED - Abnormal; Notable for the following components:   Glucose-Capillary 284 (*)    All other components within normal limits  I-STAT VENOUS BLOOD GAS, ED - Abnormal; Notable for the following components:   pCO2, Ven 42.4 (*)    pO2, Ven 52.0 (*)    Acid-base deficit 3.0 (*)    All other components within normal limits  ETHANOL  RAPID URINE DRUG SCREEN, HOSP PERFORMED  BLOOD GAS, VENOUS    EKG EKG Interpretation  Date/Time:  Wednesday April 14 2018 17:19:30 EDT Ventricular Rate:  91 PR Interval:    QRS Duration: 84 QT Interval:  348 QTC Calculation: 429 R Axis:   73 Text Interpretation:  Sinus rhythm Borderline ST elevation, anterior leads nonspecific T wave changes no significant change since Sept 2019 Confirmed by Pricilla Loveless [phone]) on 04/14/2018 6:42:52 PM   Radiology No results found.  Procedures Procedures (including critical care time)  Medications Ordered in ED Medications  insulin aspart (NOVOLOG) FlexPen 0-15 Units (has no administration in time range)  insulin glargine (LANTUS) injection 35 Units (has no administration in  time range)  sodium chloride 0.9 % bolus 1,000 mL (0 mLs Intravenous Stopped 04/14/18 2009)  insulin aspart (novoLOG) injection 8 Units (8 Units Intravenous Given 04/14/18 2101)     Initial Impression / Assessment and Plan / ED Course  I have reviewed the triage vital signs and the nursing notes.  Pertinent labs & imaging results that were available during my care of the patient were reviewed by me and considered in my medical decision making (see chart for details).     Final Clinical Impressions(s) / ED Diagnoses   Final diagnoses:  Encounter for psychiatric assessment  Hyperglycemia   Patient presented the ED for psychiatric evaluation for he was reportedly IVC'ed while at [facility] prior to arrival. [facility] paperwork reviewed and are not filled out. [name] is closed an unable to be contacted for further information regarding IVC paperwork. Denies SI, HI, or AVH at this time. Nursing contacted magistrate and faxed paperwork, ivc paperwork completed.  Has no medical complaints at this time.  Physical exam reassuring.  CBC WNL. CMP with elevated blood glucose but no elevated anion gap. Normal bicarb. Kidney and liver function normal. Beta-hydroxybutyrate slightly elevated. VBG with normal pH. UA with glucosuria, but no ketonuria. Pt not in DKA based on laboratory work and clinically has no signs or sxs, however given elevated CBG, will give IVF and insulin and reassess.  UDS and ETOH negative.   Patient is medically cleared for behavioral health evaluation.  Consult placed to behavioral health.  Care signed out to default provider at shift change.  ED Discharge Orders    None       Rayne Du 04/14/18 2247    Pricilla Loveless, MD 04/19/18 [phone]

## 2018-04-14 NOTE — ED Notes (Signed)
Pt stating [name] [name] said he was homicidal. Pt denies thoughts of harming self or others to this RN. Pt states no event occurred today for facility to believe he is homicidal. Pt currently on the phone with mother trying to get ankle monitor bracelet charger.

## 2018-04-14 NOTE — ED Notes (Signed)
CBG 284 

## 2018-04-14 NOTE — ED Notes (Signed)
Pt in purple scrubs. [employer] at bedside. Security wanded pt.
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
## 2018-04-14 NOTE — ED Notes (Signed)
Pt given Sprite Zero, and graham crackers with peanut butter.

## 2018-04-14 NOTE — ED Notes (Signed)
First exam, affidavit, and petition and custody faxed to [facility]

## 2018-04-14 NOTE — ED Triage Notes (Addendum)
Pt brought in by [employer] after being picked up at [facility]. Pt IVC'd, unknown why pt has been IVC'd other than mental problems or who took out IVC on pt. Upon further assessment pt states that [facility] sent pt here along with pt's attorney so [facility] but they can't take pt for a couple of days. Here for medical clearance. Pt has parole bracelet on. Pt states "they think I am homocidal and suicidal but I don't really think I am, I only have SI thoughts when people ask me about it"

## 2018-04-14 NOTE — ED Notes (Signed)
RN in Clinical research associate who will be faxing over affidavit. TTS at bedside.

## 2018-04-14 NOTE — ED Notes (Signed)
[place], Secretary faxed IVC paperwork to [place] at [facility] and Therapist, occupational.

## 2018-04-14 NOTE — ED Notes (Signed)
Pt speaking with TTS. Copies made of affidavit. EDP shown paperwork.

## 2018-04-14 NOTE — ED Notes (Addendum)
Valuables given to security; belongings inventoried and placed in locker #2 in [place].

## 2018-04-14 NOTE — ED Notes (Signed)
ED Provider at bedside. 

## 2018-04-14 NOTE — ED Notes (Signed)
CBG 385 

## 2018-04-15 LAB — CBG MONITORING, ED
GLUCOSE-CAPILLARY: 264 mg/dL — AB (ref 70–99)
GLUCOSE-CAPILLARY: 268 mg/dL — AB (ref 70–99)
Glucose-Capillary: 273 mg/dL — ABNORMAL HIGH (ref 70–99)
Glucose-Capillary: 214 mg/dL — ABNORMAL HIGH (ref 70–99)

## 2018-04-15 NOTE — ED Notes (Signed)
Patient at RN desk making his last phone call of the night to his mom-Monique,RN

## 2018-04-15 NOTE — ED Notes (Signed)
Patient eating dinner at bedside at this time. Calm Cooperative.

## 2018-04-15 NOTE — ED Notes (Signed)
Regular Diet was ordered for Dinner. 

## 2018-04-15 NOTE — ED Notes (Signed)
Patient showering

## 2018-04-15 NOTE — ED Notes (Signed)
Pt on phone, call x2 for the day

## 2018-04-15 NOTE — ED Notes (Signed)
Pt has been accepted at [facility] but needs referral from Carney Bern at [facility] , pt needs to be more stable  With sugars first

## 2018-04-15 NOTE — Progress Notes (Signed)
CSW called Intake/Admissions at [facility]) and spoke first to [name], [employer].  Patient is NOT on their priority list.  This Clinical research associate asked that they investigate the information that he was and call me back.  [name] [name]. [name], MSW, LCSWA Disposition Clinical Social Work [phone] (cell) [phone] (office)

## 2018-04-15 NOTE — ED Notes (Signed)
Regular Diet was ordered for Lunch. 

## 2018-04-15 NOTE — ED Notes (Signed)
Pt up eating breakfast  

## 2018-04-15 NOTE — ED Notes (Signed)
Patient asked to call mother to update her on his transfer, this RN stated that was ok.

## 2018-04-15 NOTE — ED Notes (Signed)
Pm snack given: diet coke, graham crackers and peanut butter

## 2018-04-15 NOTE — ED Notes (Signed)
Patient was given Sprite Zero and Short Bread Cookies.

## 2018-04-15 NOTE — ED Notes (Signed)
Diabetic nurse into see pt

## 2018-04-15 NOTE — Progress Notes (Signed)
[facility] referral completed and submitted.  Authorization #[id number].  Patient is to be placed on priority wait list per Dr. Adriana Simas, [facility] Medical Director.  [name] [name]. [name], MSW, LCSWA Disposition Clinical Social Work [phone] (cell) [phone] (office)

## 2018-04-15 NOTE — Progress Notes (Signed)
CSW aware that pt is back in the ED. CSW received call from [facility]- [phone] [facility] Worker) regarding pt on 04/14/18. Dahlia Client informed CSW that pt was being IVC'd by [employer] team and would be coming to the ED to insure that pt's medical needs are under control before being sent to [facility]. Dahlia Client expressed that a team of people who are involved with pt have been working with [facility] to get pt admitted to their facility. From CSW's understanding pt has a bed at [facility] already but is needing to be medically cleared before being able to go to the facility. CSW was also informed by Dahlia Client that pt would need a referral to [facility] even though Dahlia Client verbalized that Dr Adriana Simas [facility] Medical Director) is aware of pt's needs and expressed that pt is already on their priority list.   CSW spoke with Carney Bern from [facility] to update her on pt's needs and to assist as needed. Carney Bern expressed that she would reach out to [place] with [place], [employer], and [facility] to ensure that this remains as the plan and to complete referral for pt to [facility].   At this time CSW will continue to follow for any further ED CSW needs.    Claude Manges Arlissa Monteverde, MSW, LCSW-A Emergency Department Clinical Social Worker [phone]

## 2018-04-16 LAB — CBG MONITORING, ED
GLUCOSE-CAPILLARY: 406 mg/dL — AB (ref 70–99)
Glucose-Capillary: 188 mg/dL — ABNORMAL HIGH (ref 70–99)
Glucose-Capillary: 287 mg/dL — ABNORMAL HIGH (ref 70–99)
Glucose-Capillary: 331 mg/dL — ABNORMAL HIGH (ref 70–99)
Glucose-Capillary: 111 mg/dL — ABNORMAL HIGH (ref 70–99)
Glucose-Capillary: 161 mg/dL — ABNORMAL HIGH (ref 70–99)
Glucose-Capillary: 174 mg/dL — ABNORMAL HIGH (ref 70–99)

## 2018-04-16 NOTE — ED Notes (Signed)
Lunch tray ordered 

## 2018-04-16 NOTE — ED Notes (Signed)
Pt using one five minute phone call at this time.

## 2018-04-16 NOTE — ED Notes (Addendum)
Notified Wents MD of CBG of 406, MD advises to give 18 units Novolog

## 2018-04-16 NOTE — Progress Notes (Signed)
Disposition contacted [facility] Intake and Admission and spoke with Coralee North, who confirmed that patient was on their priority list for admission.  Timmothy Euler. [name], MSW, LCSWA Disposition Clinical Social Work [phone] (cell) [phone] (office)

## 2018-04-16 NOTE — ED Notes (Signed)
Pt just finished meal tray, will check CBG and administer insulin per guidelines.

## 2018-04-16 NOTE — ED Notes (Signed)
Meal tray ordered 

## 2018-04-17 LAB — CBG MONITORING, ED
GLUCOSE-CAPILLARY: 176 mg/dL — AB (ref 70–99)
Glucose-Capillary: 214 mg/dL — ABNORMAL HIGH (ref 70–99)
Glucose-Capillary: 236 mg/dL — ABNORMAL HIGH (ref 70–99)
Glucose-Capillary: 265 mg/dL — ABNORMAL HIGH (ref 70–99)

## 2018-04-17 NOTE — ED Triage Notes (Signed)
[facility] called at request of Pt . Pt asking when he will go to in Pt. Bed. Pt informed is on the list for a priority bed at [facility]. There is not a bed at this time. Pt then asked if he could take off the IVC order. Pt instructed he could not.

## 2018-04-17 NOTE — ED Triage Notes (Signed)
Dinner ordered 

## 2018-04-17 NOTE — ED Notes (Signed)
Carb modified tray called in @ 9:55am for lunch  

## 2018-04-17 NOTE — ED Triage Notes (Signed)
TTS was done this morning  04-17-18.

## 2018-04-17 NOTE — ED Notes (Signed)
Carb modified tray ordered for breakfast 

## 2018-04-17 NOTE — Progress Notes (Signed)
Re-assessment: Patient was alert and oriented x4. His speech was logical and coherent. His appearance was unremarkable. Patient's mood was anxious due to concerns about going to [facility] since he is on probation. Patient stated he is unsure why he is under IVC and denies SI/HI/AVH. Per IVC patient is psychiatrically decompensating and has significant medical issues that are being exacerbated by patient's homelessness and drug use. Patient's PO is Sebastian Ache [phone]. TTS is attempting to contact regarding probation violation. Due to history and IVC patient continues to meet in patient criteria despite denying SI/HI/AVH at this time.

## 2018-04-17 NOTE — Progress Notes (Signed)
[facility]W contacted [facility] to check status of referral and find out if they need any additional records. [facility] admissions will call on Monday 10/21 for updated weekend notes but do not require anything currently. Admissions staff were able to confirm that pt remains on priority wait list.   Ethel Rana. Alan Ripper, MSW, LCSW Clinical Social Worker 04/17/2018 3:00 PM

## 2018-04-18 ENCOUNTER — Other Ambulatory Visit: Payer: Self-pay

## 2018-04-18 LAB — CBG MONITORING, ED
GLUCOSE-CAPILLARY: 223 mg/dL — AB (ref 70–99)
GLUCOSE-CAPILLARY: 253 mg/dL — AB (ref 70–99)
Glucose-Capillary: 169 mg/dL — ABNORMAL HIGH (ref 70–99)
Glucose-Capillary: 147 mg/dL — ABNORMAL HIGH (ref 70–99)
Glucose-Capillary: 286 mg/dL — ABNORMAL HIGH (ref 70–99)

## 2018-04-18 NOTE — ED Notes (Signed)
Pt made phone call from nurses' desk then returned to room.  

## 2018-04-18 NOTE — ED Notes (Signed)
Pt ate breakfast. Pt woke for brief moment to take meds and assessment to be performed then returned to sleeping.

## 2018-04-18 NOTE — Progress Notes (Signed)
ED CSW aware that pt remains in the ED waiting for placement at [facility] CSW will continue to follow for any further ED CSW needs.    Claude Manges Yochanan Eddleman, MSW, LCSW-A Emergency Department Clinical Social Worker [phone]

## 2018-04-18 NOTE — ED Notes (Signed)
Pt given snacks as requested - sugar free cookies and Sprite Zero.

## 2018-04-18 NOTE — ED Notes (Addendum)
Pt's "adoped stepfather", Donovan Kail, and male visiting w/pt. Asking for water - given then stepfather asking for Sprite - given.

## 2018-04-18 NOTE — ED Notes (Signed)
Pt noted to be laughing loudly and cursing while watching tv. Pt lowers voice as requested by staff.

## 2018-04-18 NOTE — ED Notes (Signed)
Pt noted to be laughing out loud inappropriately.

## 2018-04-18 NOTE — ED Notes (Signed)
Pt talking w/Sitter.  

## 2018-04-18 NOTE — ED Notes (Signed)
Pt given sandwich and Sprite Zero as requested.

## 2018-04-18 NOTE — [facility] Counselor (Signed)
Pt reassessed.  He continues to deny suicidal ideation and homicidal ideation.  Per T. Starkes, will continue to hold for admission to [facility].

## 2018-04-18 NOTE — ED Notes (Signed)
Checked CBG 169 RN Susanna informed

## 2018-04-18 NOTE — ED Notes (Signed)
Pt watching tv - laughs out loudly at times. States he is tired of being in ED and wants to be d/c'd. Voiced understanding of tx plan.

## 2018-04-18 NOTE — ED Notes (Signed)
Father called this morning and requested that I let patient know he would be visiting at 12:30 today during visiting hours.  Patient made aware.

## 2018-04-18 NOTE — ED Notes (Addendum)
Snack given as requested. Re-TTS being performed.

## 2018-04-18 NOTE — ED Notes (Signed)
Visitors leaving at this time. Pt eating lunch.

## 2018-04-19 LAB — CBG MONITORING, ED
GLUCOSE-CAPILLARY: 122 mg/dL — AB (ref 70–99)
GLUCOSE-CAPILLARY: 278 mg/dL — AB (ref 70–99)
Glucose-Capillary: 296 mg/dL — ABNORMAL HIGH (ref 70–99)

## 2018-04-19 NOTE — ED Notes (Signed)
Lunch tray ordered 

## 2018-04-19 NOTE — Progress Notes (Signed)
Faxed over updated referral information to [facility] per their request.  The following updated records were sent:  Last 4 days of Vitals Last 4 days of Labs (primarily CBG monitoring) Last 4 days of [facility] EDP notes Behavioral Health Notes IVC 3-page [facility] referral form.  Timmothy Euler. [name], MSW, LCSWA Disposition Clinical Social Work [phone] (cell) [phone] (office)'

## 2018-04-19 NOTE — Progress Notes (Signed)
TTS counselor attempted to re-assess but tele-psych machine is currently in use. Will call back shortly.

## 2018-04-19 NOTE — Progress Notes (Signed)
CSW spoke with admissions staff at [facility]. They are requesting pt's discharge summary. Caryn Bee @[facility]  ED was notified and will fax the documents to [facility] at [phone]. [facility] staff stated that once the discharge summary was received, they would provide accepting information to CSW.   Wells Guiles, LCSW, LCAS Disposition CSW [facility]/TTS [phone]

## 2018-04-19 NOTE — Progress Notes (Signed)
CSW reached out to pt's [place]'s worker Mordecai Rasmussen to gather information on delays of pt being admitted to [facility]. CSW left voicemail for her at this time. CSW made aware that pt is on the priority list at this time. Carney Bern from [facility] expressed that she would follow up with [facility] today to see what number pt is on the list at this time.  CSW informed that the plan at this time remains as pt staying in the ED until bed is ready at [facility].   Claude Manges Collene Massimino, MSW, LCSW-A Emergency Department Clinical Social Worker [phone]

## 2018-04-19 NOTE — Progress Notes (Signed)
Disposition CSW contacted [facility] Admissions/Intake and spoke with Coralee North.  [facility] does not give information regarding patient's position on priority list but this writer was told, "He's definitely at the top of the list.  A couple more discharges and he'll be in (accepted)."  [facility] ED CSW, [place] Dom, notified.  Timmothy Euler. [name], MSW, LCSWA Disposition Clinical Social Work [phone] (cell) [phone] (office)

## 2018-04-19 NOTE — ED Triage Notes (Signed)
TC from [facility] to report Pt has been accepted . Call back # [phone]. This Ocean[employer] Dept to to arrange transport.

## 2018-04-19 NOTE — ED Triage Notes (Addendum)
Information faxed to [facility]  Per request of Carney Bern at [facility]  as explained by [facility]. # [phone]

## 2018-07-12 ENCOUNTER — Encounter (HOSPITAL_COMMUNITY): Payer: Self-pay | Admitting: Emergency Medicine

## 2018-07-12 ENCOUNTER — Other Ambulatory Visit: Payer: Self-pay

## 2018-07-12 ENCOUNTER — Emergency Department (HOSPITAL_COMMUNITY)
Admission: EM | Admit: 2018-07-12 | Discharge: 2018-07-12 | Disposition: A | Discharge status: Home / Self Care | Payer: Medicaid Other | Attending: Emergency Medicine | Admitting: Emergency Medicine

## 2018-07-12 DIAGNOSIS — J45909 Unspecified asthma, uncomplicated: Secondary | ICD-10-CM | POA: Insufficient documentation

## 2018-07-12 DIAGNOSIS — E1065 Type 1 diabetes mellitus with hyperglycemia: Secondary | ICD-10-CM | POA: Insufficient documentation

## 2018-07-12 DIAGNOSIS — Z79899 Other long term (current) drug therapy: Secondary | ICD-10-CM | POA: Diagnosis not present

## 2018-07-12 DIAGNOSIS — R739 Hyperglycemia, unspecified: Secondary | ICD-10-CM

## 2018-07-12 DIAGNOSIS — Z7722 Contact with and (suspected) exposure to environmental tobacco smoke (acute) (chronic): Secondary | ICD-10-CM | POA: Insufficient documentation

## 2018-07-12 LAB — CBG MONITORING, ED
Glucose-Capillary: 391 mg/dL — ABNORMAL HIGH (ref 70–99)
Glucose-Capillary: 313 mg/dL — ABNORMAL HIGH (ref 70–99)
Glucose-Capillary: 235 mg/dL — ABNORMAL HIGH (ref 70–99)
Glucose-Capillary: 215 mg/dL — ABNORMAL HIGH (ref 70–99)

## 2018-07-12 LAB — COMPREHENSIVE METABOLIC PANEL
ALBUMIN: 3.9 g/dL (ref 3.5–5.0)
ALT: 27 U/L (ref 0–44)
AST: 19 U/L (ref 15–41)
Alkaline Phosphatase: 86 U/L (ref 38–126)
Anion gap: 11 (ref 5–15)
BUN: 8 mg/dL (ref 6–20)
CO2: 22 mmol/L (ref 22–32)
Calcium: 8.7 mg/dL — ABNORMAL LOW (ref 8.9–10.3)
Chloride: 97 mmol/L — ABNORMAL LOW (ref 98–111)
Creatinine, Ser: 0.82 mg/dL (ref 0.61–1.24)
GFR calc Af Amer: >60 mL/min (ref >60)
GFR calc non Af Amer: >60 mL/min (ref >60)
Glucose, Bld: 478 mg/dL — ABNORMAL HIGH (ref 70–99)
Potassium: 3.7 mmol/L (ref 3.5–5.1)
Sodium: 130 mmol/L — ABNORMAL LOW (ref 135–145)
Total Bilirubin: 1.4 mg/dL — ABNORMAL HIGH (ref 0.3–1.2)
Total Protein: 6.5 g/dL (ref 6.5–8.1)

## 2018-07-12 LAB — CBC WITH DIFFERENTIAL/PLATELET
Abs Immature Granulocytes: 0.05 10*3/uL (ref 0.00–0.07)
BASOS PCT: 0 %
Basophils Absolute: 0 10*3/uL (ref 0.0–0.1)
Eosinophils Absolute: 0.1 10*3/uL (ref 0.0–0.5)
Eosinophils Relative: 1 %
HCT: 48.9 % (ref 39.0–52.0)
Hemoglobin: 16.5 g/dL (ref 13.0–17.0)
Immature Granulocytes: 0 %
Lymphocytes Relative: 20 %
Lymphs Abs: 2.3 10*3/uL (ref 0.7–4.0)
MCH: 26.3 pg (ref 26.0–34.0)
MCHC: 33.7 g/dL (ref 30.0–36.0)
MCV: 77.9 fL — ABNORMAL LOW (ref 80.0–100.0)
Monocytes Absolute: 0.9 10*3/uL (ref 0.1–1.0)
Monocytes Relative: 8 %
Neutro Abs: 8 10*3/uL — ABNORMAL HIGH (ref 1.7–7.7)
Neutrophils Relative %: 71 %
Platelets: 208 10*3/uL (ref 150–400)
RBC: 6.28 MIL/uL — ABNORMAL HIGH (ref 4.22–5.81)
RDW: 11.9 % (ref 11.5–15.5)
WBC: 11.3 10*3/uL — ABNORMAL HIGH (ref 4.0–10.5)
nRBC: 0 % (ref 0.0–0.2)

## 2018-07-12 LAB — CK: Total CK: 288 U/L (ref 49–397)

## 2018-07-12 MED ORDER — SODIUM CHLORIDE 0.9 % IV BOLUS (SEPSIS)
1000.0000 mL | Freq: Once | INTRAVENOUS | Status: AC
  Administered 2018-07-12: 1000 mL via INTRAVENOUS

## 2018-07-12 MED ORDER — INSULIN GLARGINE 100 UNIT/ML ~~LOC~~ SOLN
30.0000 [IU] | Freq: Two times a day (BID) | SUBCUTANEOUS | Status: DC
  Filled 2018-07-12: qty 0.3

## 2018-07-12 MED ORDER — INSULIN ASPART 100 UNIT/ML ~~LOC~~ SOLN
5.0000 [IU] | Freq: Once | SUBCUTANEOUS | Status: AC
  Administered 2018-07-12: 5 [IU] via INTRAVENOUS

## 2018-07-12 MED ORDER — INSULIN GLARGINE 100 UNIT/ML ~~LOC~~ SOLN
30.0000 [IU] | Freq: Once | SUBCUTANEOUS | Status: AC
  Administered 2018-07-12: 30 [IU] via SUBCUTANEOUS
  Filled 2018-07-12: qty 0.3

## 2018-07-12 MED ORDER — INSULIN GLARGINE 100 UNIT/ML ~~LOC~~ SOLN: 30.0000 [IU] | Freq: Two times a day (BID) | SUBCUTANEOUS | 6 refills | Status: DC

## 2018-07-12 MED ORDER — INSULIN ASPART 100 UNIT/ML ~~LOC~~ SOLN: 0.0000 [IU] | Freq: Three times a day (TID) | SUBCUTANEOUS | Status: DC

## 2018-07-12 MED ORDER — INSULIN LISPRO 100 UNIT/ML ~~LOC~~ SOLN: SUBCUTANEOUS | 10 refills | Status: DC

## 2018-07-12 NOTE — Discharge Instructions (Addendum)
Steps to find a Primary Care Provider (PCP): ° °Call [phone] or [phone] to access "[employer] Find a Doctor Service." ° °2.  You may also go on the [facility] website at [email or web address] ° °3.  [facility] also frequently accepts new patients. ° °[facility]  °[address] °[place] [address] °[phone] ° °4.  There are also multiple [facility], [facility] and [name] practices throughout the [place] that are frequently accepting new patients. You may find a clinic that is close to your home and contact them. ° °[facility] °[email or web address] °[phone] ° °[facility] °[email or web address] ° °[facility] °[email or web address] °[phone] ° °[name] °[email or web address] °[phone] ° °5.  Local Health Departments also can provide primary care services. ° °[facility]  °[address] °[place] [address] [phone] ° °[facility] °[address] °[place] [address] °[phone] ° °[facility] °[address]  °[place] [address] °[phone] ° ° °

## 2018-07-12 NOTE — Discharge Planning (Signed)
EDCM spoke with homeless pt at bedside regarding medication assistance.  Pt is active at [facility] but does not have reliable transportation each month.  EDCM provided [place] transit pass and advised pt to relay transportation issues to ACT team as they can assist as it get close to him needing refills for Rx.

## 2018-07-12 NOTE — ED Notes (Signed)
CBG: 313 

## 2018-07-12 NOTE — Progress Notes (Signed)
CSW aware of consult for assistance with transportation back to hotel and assistance with getting prescription for patient's insulin. CSW has deferred to [facility] regarding medication assistance. CSW to assist with transportation once patient is ready for discharge.  Archie Balboa, LCSWA  Clinical Social Work Department  [employer]  [phone]

## 2018-07-12 NOTE — ED Notes (Signed)
Patient verbalizes understanding of discharge instructions. Opportunity for questioning and answers were provided. Pt discharged from ED. 

## 2018-07-12 NOTE — ED Provider Notes (Signed)
TIME SEEN: 2:35 AM  CHIEF COMPLAINT: Hyperglycemia  HPI: Patient is a 18 year old male with history of insulin-dependent diabetes, depression, seizures who presents to the emergency department with concerns about his blood sugar.  States that he has felt weak and tired and feels like his blood sugar is elevated.  States he was just discharged from [facility] on Thursday, January 9.  States he was not discharged with enough lispro to make it more than 1 day.  Reports he is on Lantus 30 units every morning and 30 units at night and sliding scale lispro.  States he has not had anything to eat or drink all day.  He states he has been walking since 8 AM.  Reports he has been living in a hotel but has no way to get back to the hotel.  States that the ACT team and [facility] are supposed to be helping him.  ROS: See HPI Constitutional: no fever  Eyes: no drainage  ENT: no runny nose   Cardiovascular:  no chest pain  Resp: no SOB  GI: no vomiting GU: no dysuria Integumentary: no rash  Allergy: no hives  Musculoskeletal: no leg swelling  Neurological: no slurred speech ROS otherwise negative  PAST MEDICAL HISTORY/PAST SURGICAL HISTORY:  Past Medical History:  Diagnosis Date  . ADD (attention deficit disorder)   . Allergy   . Anxiety   . Asthma   . Depression   . Epileptic seizure (HCC)    "both petit and grand mal; last sz ~ 2 wk ago" (06/17/2013)  . Headache(784.0)    "2-3 times/wk usually" (06/17/2013)  . Heart murmur    "heard a slight one earlier today" (06/17/2013)  . Homeless   . Migraine    "maybe once/month; it's severe" (06/17/2013)  . ODD (oppositional defiant disorder)   . Sickle cell trait (HCC)   . Type I diabetes mellitus (HCC)    "that's what they're thinking now" (06/17/2013)  . Vision abnormalities    "takes him longer to focus cause; from his sz" (06/17/2013)    MEDICATIONS:  Prior to Admission medications   Medication Sig Start Date End Date Taking? Authorizing  Provider  albuterol (PROVENTIL HFA;VENTOLIN HFA) 108 (90 Base) MCG/ACT inhaler Inhale 2 puffs into the lungs every 6 (six) hours as needed for wheezing or shortness of breath. Patient not taking: Reported on 04/02/2018 03/09/18   Armandina StammerNwoko, Agnes I, NP  chlorproMAZINE (THORAZINE) 100 MG tablet Take 1 tablet (100 mg total) by mouth at bedtime. For mood control Patient taking differently: Take 100 mg by mouth daily.  03/09/18   Armandina StammerNwoko, Agnes I, NP  FLUoxetine (PROZAC) 20 MG capsule Take 1 capsule (20 mg total) by mouth daily. For depression 03/10/18   Armandina StammerNwoko, Agnes I, NP  Fluticasone Propionate HFA (FLOVENT HFA IN) Inhale 2 puffs into the lungs daily.    [provider]  glucose blood (ACCU-CHEK GUIDE) test strip Check glucose 6x daily: For blood sugar checks 03/22/18   Long, Arlyss RepressJoshua G, MD  hydrOXYzine (ATARAX/VISTARIL) 50 MG tablet Take 1 tablet (50 mg total) by mouth every 6 (six) hours as needed for anxiety. 03/09/18   Armandina StammerNwoko, Agnes I, NP  insulin aspart (NOVOLOG FLEXPEN) 100 UNIT/ML FlexPen Inject 0-15 Units into the skin 3 (three) times daily with meals. (Sliding scale): For diabetes management 03/09/18   Armandina StammerNwoko, Agnes I, NP  insulin glargine (LANTUS) 100 UNIT/ML injection Inject 0.35 mLs (35 Units total) into the skin 2 (two) times daily. 03/21/18   Harolyn RutherfordJoy, Shawn  C, PA-C  nicotine (NICODERM CQ - DOSED IN MG/24 HOURS) 21 mg/24hr patch Place 1 patch (21 mg total) onto the skin daily. (May purchase from over the counter): For smoking cessation Patient not taking: Reported on 04/14/2018 03/10/18   Armandina Stammer I, NP  traZODone (DESYREL) 100 MG tablet Take 1 tablet (100 mg total) by mouth at bedtime as needed for sleep. Patient not taking: Reported on 04/14/2018 03/09/18   Armandina Stammer I, NP    ALLERGIES:  Allergies  Allergen Reactions  . Bee Venom Anaphylaxis  . Fish Allergy Anaphylaxis  . Shellfish Allergy Anaphylaxis    SOCIAL HISTORY:  Social History   Tobacco Use  . Smoking status: Passive Smoke  Exposure - Never Smoker  . Smokeless tobacco: Never Used  . Tobacco comment: Mom and dad smoke outside   Substance Use Topics  . Alcohol use: No    Alcohol/week: 0.0 standard drinks    FAMILY HISTORY: Family History  Problem Relation Age of Onset  . Asthma Mother   . COPD Mother   . Other Mother        possible autoimmune, unclear  . Diabetes Maternal Grandmother   . Heart disease Maternal Grandmother   . Hypertension Maternal Grandmother   . Mental illness Maternal Grandmother   . Heart disease Maternal Grandfather   . Hyperlipidemia Paternal Grandmother   . Hyperlipidemia Paternal Grandfather   . Migraines Sister        Hemiplegic Migraines     EXAM: BP (!) 134/98 (BP Location: Right Arm)   Pulse 86   Temp 99 F (37.2 C) (Oral)   Resp 20   SpO2 95%  CONSTITUTIONAL: Alert and oriented and responds appropriately to questions. Well-appearing; well-nourished HEAD: Normocephalic EYES: Conjunctivae clear, pupils appear equal, EOMI ENT: normal nose; moist mucous membranes NECK: Supple, no meningismus, no nuchal rigidity, no LAD  CARD: RRR; S1 and S2 appreciated; no murmurs, no clicks, no rubs, no gallops RESP: Normal chest excursion without splinting or tachypnea; breath sounds clear and equal bilaterally; no wheezes, no rhonchi, no rales, no hypoxia or respiratory distress, speaking full sentences ABD/GI: Normal bowel sounds; non-distended; soft, non-tender, no rebound, no guarding, no peritoneal signs, no hepatosplenomegaly BACK:  The back appears normal and is non-tender to palpation, there is no CVA tenderness EXT: Normal ROM in all joints; non-tender to palpation; no edema; normal capillary refill; no cyanosis, no calf tenderness or swelling    SKIN: Normal color for age and race; warm; no rash NEURO: Moves all extremities equally PSYCH: The patient's mood and manner are appropriate. Grooming and personal hygiene are appropriate.  MEDICAL DECISION MAKING: Patient here  with hyperglycemia.  And he states he feels weak and tired but states he has been walking all day.  Will check labs and urine to ensure he is not in DKA also check CK level given he reports he has been walking all day.  Will give IV insulin and IV fluids.  Patient states that if we give him prescriptions for his Lantus and lispro he has no way to afford them or pick them up.  He has no way back to his hotel.  Will consult social work in the morning to help with some of these outpatient needs.  He does not have a primary care provider either.  ED PROGRESS: Patient does not appear to be in DKA.  His CK level is normal.  Will recheck blood sugar after IV fluids and IV insulin.   Blood  sugar still elevated at 313 but is coming down.  Will give second dose of IV insulin.  Patient will wait to be seen by social work in the morning to help get his insulin prescriptions as he has had DKA several times in the past and I worry that if he does not get discharged with means to get his insulin today he will come back in DKA.   I reviewed all nursing notes, vitals, pertinent previous records, EKGs, lab and urine results, imaging (as available).      Peggy Loge, Layla Maw, DO 07/12/18 [phone]

## 2018-07-12 NOTE — ED Triage Notes (Signed)
Patient was in [place] and just got out on Thurs and only had enough Lispro for a few days.  Patient states that he feels like his sugar is high today.

## 2018-07-14 ENCOUNTER — Emergency Department (HOSPITAL_COMMUNITY)
Admission: EM | Admit: 2018-07-14 | Discharge: 2018-07-14 | Disposition: A | Discharge status: Home / Self Care | Payer: Medicaid Other | Attending: Emergency Medicine | Admitting: Emergency Medicine

## 2018-07-14 ENCOUNTER — Encounter (HOSPITAL_COMMUNITY): Payer: Self-pay | Admitting: Pharmacy Technician

## 2018-07-14 DIAGNOSIS — T50992A Poisoning by other drugs, medicaments and biological substances, intentional self-harm, initial encounter: Secondary | ICD-10-CM | POA: Insufficient documentation

## 2018-07-14 DIAGNOSIS — Z79899 Other long term (current) drug therapy: Secondary | ICD-10-CM | POA: Insufficient documentation

## 2018-07-14 DIAGNOSIS — F909 Attention-deficit hyperactivity disorder, unspecified type: Secondary | ICD-10-CM | POA: Insufficient documentation

## 2018-07-14 DIAGNOSIS — Z7722 Contact with and (suspected) exposure to environmental tobacco smoke (acute) (chronic): Secondary | ICD-10-CM | POA: Diagnosis not present

## 2018-07-14 DIAGNOSIS — E109 Type 1 diabetes mellitus without complications: Secondary | ICD-10-CM | POA: Diagnosis not present

## 2018-07-14 DIAGNOSIS — F332 Major depressive disorder, recurrent severe without psychotic features: Secondary | ICD-10-CM | POA: Insufficient documentation

## 2018-07-14 DIAGNOSIS — T50902A Poisoning by unspecified drugs, medicaments and biological substances, intentional self-harm, initial encounter: Secondary | ICD-10-CM

## 2018-07-14 LAB — I-STAT VENOUS BLOOD GAS, ED
Acid-base deficit: 3 mmol/L — ABNORMAL HIGH (ref 0.0–2.0)
Bicarbonate: 20.3 mmol/L (ref 20.0–28.0)
O2 Saturation: 100 %
PO2 VEN: 193 mmHg — AB (ref 32.0–45.0)
TCO2: 21 mmol/L — ABNORMAL LOW (ref 22–32)
pCO2, Ven: 31.6 mmHg — ABNORMAL LOW (ref 44.0–60.0)
pH, Ven: 7.415 (ref 7.250–7.430)

## 2018-07-14 LAB — CBG MONITORING, ED
GLUCOSE-CAPILLARY: 292 mg/dL — AB (ref 70–99)
Glucose-Capillary: 499 mg/dL — ABNORMAL HIGH (ref 70–99)

## 2018-07-14 LAB — COMPREHENSIVE METABOLIC PANEL
ALT: 22 U/L (ref 0–44)
ALT: 21 U/L (ref 0–44)
ANION GAP: 18 — AB (ref 5–15)
AST: 15 U/L (ref 15–41)
AST: 14 U/L — ABNORMAL LOW (ref 15–41)
Albumin: 3.9 g/dL (ref 3.5–5.0)
Albumin: 3.8 g/dL (ref 3.5–5.0)
Alkaline Phosphatase: 100 U/L (ref 38–126)
Alkaline Phosphatase: 83 U/L (ref 38–126)
Anion gap: 13 (ref 5–15)
BUN: 12 mg/dL (ref 6–20)
BUN: 10 mg/dL (ref 6–20)
CALCIUM: 8.7 mg/dL — AB (ref 8.9–10.3)
CO2: 18 mmol/L — ABNORMAL LOW (ref 22–32)
CO2: 21 mmol/L — ABNORMAL LOW (ref 22–32)
Calcium: 8.5 mg/dL — ABNORMAL LOW (ref 8.9–10.3)
Chloride: 94 mmol/L — ABNORMAL LOW (ref 98–111)
Chloride: 105 mmol/L (ref 98–111)
Creatinine, Ser: 1.1 mg/dL (ref 0.61–1.24)
Creatinine, Ser: 1.09 mg/dL (ref 0.61–1.24)
GFR calc Af Amer: >60 mL/min (ref >60)
GFR calc Af Amer: >60 mL/min (ref >60)
GFR calc non Af Amer: >60 mL/min (ref >60)
GFR calc non Af Amer: >60 mL/min (ref >60)
Glucose, Bld: 547 mg/dL (ref 70–99)
Glucose, Bld: 221 mg/dL — ABNORMAL HIGH (ref 70–99)
Potassium: 4.2 mmol/L (ref 3.5–5.1)
Potassium: 3.4 mmol/L — ABNORMAL LOW (ref 3.5–5.1)
Sodium: 130 mmol/L — ABNORMAL LOW (ref 135–145)
Sodium: 139 mmol/L (ref 135–145)
Total Bilirubin: 1.9 mg/dL — ABNORMAL HIGH (ref 0.3–1.2)
Total Bilirubin: 1.6 mg/dL — ABNORMAL HIGH (ref 0.3–1.2)
Total Protein: 6.7 g/dL (ref 6.5–8.1)
Total Protein: 6.6 g/dL (ref 6.5–8.1)

## 2018-07-14 LAB — CBC WITH DIFFERENTIAL/PLATELET
Abs Immature Granulocytes: 0.02 10*3/uL (ref 0.00–0.07)
Basophils Absolute: 0 10*3/uL (ref 0.0–0.1)
Basophils Relative: 1 %
Eosinophils Absolute: 0.1 10*3/uL (ref 0.0–0.5)
Eosinophils Relative: 1 %
HCT: 47.7 % (ref 39.0–52.0)
HEMOGLOBIN: 15.9 g/dL (ref 13.0–17.0)
Immature Granulocytes: 0 %
Lymphocytes Relative: 16 %
Lymphs Abs: 1.1 10*3/uL (ref 0.7–4.0)
MCH: 26.2 pg (ref 26.0–34.0)
MCHC: 33.3 g/dL (ref 30.0–36.0)
MCV: 78.7 fL — ABNORMAL LOW (ref 80.0–100.0)
MONO ABS: 0.3 10*3/uL (ref 0.1–1.0)
Monocytes Relative: 5 %
Neutro Abs: 5 10*3/uL (ref 1.7–7.7)
Neutrophils Relative %: 77 %
Platelets: 184 10*3/uL (ref 150–400)
RBC: 6.06 MIL/uL — AB (ref 4.22–5.81)
RDW: 11.7 % (ref 11.5–15.5)
WBC: 6.5 10*3/uL (ref 4.0–10.5)
nRBC: 0 % (ref 0.0–0.2)

## 2018-07-14 LAB — I-STAT CHEM 8, ED
BUN: 14 mg/dL (ref 6–20)
Calcium, Ion: 1.05 mmol/L — ABNORMAL LOW (ref 1.15–1.40)
Chloride: 95 mmol/L — ABNORMAL LOW (ref 98–111)
Creatinine, Ser: 0.6 mg/dL — ABNORMAL LOW (ref 0.61–1.24)
Glucose, Bld: 594 mg/dL (ref 70–99)
HCT: 48 % (ref 39.0–52.0)
Hemoglobin: 16.3 g/dL (ref 13.0–17.0)
Potassium: 4.2 mmol/L (ref 3.5–5.1)
SODIUM: 129 mmol/L — AB (ref 135–145)
TCO2: 20 mmol/L — ABNORMAL LOW (ref 22–32)

## 2018-07-14 LAB — RAPID URINE DRUG SCREEN, HOSP PERFORMED
Amphetamines: NOT DETECTED
BENZODIAZEPINES: NOT DETECTED
Barbiturates: NOT DETECTED
Cocaine: NOT DETECTED
Opiates: NOT DETECTED
Tetrahydrocannabinol: NOT DETECTED

## 2018-07-14 LAB — ACETAMINOPHEN LEVEL
Acetaminophen (Tylenol), Serum: <10 ug/mL — ABNORMAL LOW (ref 10–30)
Acetaminophen (Tylenol), Serum: <10 ug/mL — ABNORMAL LOW (ref 10–30)

## 2018-07-14 LAB — SALICYLATE LEVEL

## 2018-07-14 LAB — ETHANOL: Alcohol, Ethyl (B): <10 mg/dL (ref <10)

## 2018-07-14 MED ORDER — ONDANSETRON HCL 4 MG/2ML IJ SOLN
4.0000 mg | Freq: Once | INTRAMUSCULAR | Status: AC
  Administered 2018-07-14: 4 mg via INTRAVENOUS
  Filled 2018-07-14: qty 2

## 2018-07-14 MED ORDER — INSULIN GLARGINE 100 UNIT/ML ~~LOC~~ SOLN: 30.0000 [IU] | Freq: Two times a day (BID) | SUBCUTANEOUS | Status: DC

## 2018-07-14 MED ORDER — SODIUM CHLORIDE 0.9 % IV BOLUS
1000.0000 mL | Freq: Once | INTRAVENOUS | Status: AC
  Administered 2018-07-14: 1000 mL via INTRAVENOUS

## 2018-07-14 MED ORDER — INSULIN ASPART 100 UNIT/ML ~~LOC~~ SOLN: 0.0000 [IU] | Freq: Three times a day (TID) | SUBCUTANEOUS | Status: DC

## 2018-07-14 MED ORDER — INSULIN ASPART 100 UNIT/ML ~~LOC~~ SOLN
8.0000 [IU] | Freq: Once | SUBCUTANEOUS | Status: AC
  Administered 2018-07-14: 8 [IU] via INTRAVENOUS

## 2018-07-14 NOTE — ED Notes (Addendum)
Per poison control:   Watch for resp depression, Agitation and QT prolongation  May need to replace K and mag if QT>500 6hr obs or until asymptomatic 4 hour tylenol level (1430)

## 2018-07-14 NOTE — ED Triage Notes (Signed)
Pt arrives via EMS with [employer] at the bedside with reports of taking 15 visteral and 15 trazodone approx 1.5 hours ago with intention of sleep. 110/68, HR 90, RR24, 96% RA, CBG 410. Pt with hx of DM1 and has not taken meds in the last few days.

## 2018-07-14 NOTE — ED Notes (Signed)
Pt given Saltines, cheese and Diet Sprite, per Asher Muir - PA.

## 2018-07-14 NOTE — Discharge Instructions (Signed)
Please take your medication as directed.   Return to ER as needed.

## 2018-07-14 NOTE — Progress Notes (Addendum)
Inpatient Diabetes Program Recommendations  AACE/ADA: New Consensus Statement on Inpatient Glycemic Control (2015)  Target Ranges:  Prepandial:   less than 140 mg/dL      Peak postprandial:   less than 180 mg/dL (1-2 hours)      Critically ill patients:  140 - 180 mg/dL   Lab Results  Component Value Date   GLUCAP 499 (H) 07/14/2018   HGBA1C 10.8 (H) 04/05/2018    Review of Glycemic Control  Diabetes history: DM1  Inpatient Diabetes Program Recommendations:   Spoke with RN Fraser Din regarding patient's CBGs and inquired if insulin had been started. States plans include giving IV fluids and rechecking labs. According to note, patient has not taken medications in several days and patient is type 1. Consider insulin IV drip. Secure chat sent to [facility].  Thank you, Billy Fischer. Keven Soucy, RN, MSN, CDE  Diabetes Coordinator Inpatient Glycemic Control Team Team Pager [phone] (8am-5pm) 07/14/2018 3:20 PM
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
## 2018-07-14 NOTE — ED Notes (Signed)
Ordered Restaurant manager, fast food

## 2018-07-14 NOTE — ED Provider Notes (Addendum)
[facility]   CSN: [id number] Arrival date & time: 07/14/18  1200     History   Chief Complaint Chief Complaint  Patient presents with  . Ingestion    HPI [name] EmeryHector M Ander Jr. is a 18 y.o. male.  The history is provided by the patient and medical records. No language interpreter was used.  Ingestion    [name] EmeryHector M Hattabaugh Jr. is a 18 y.o. male  with a PMH of epilepsy, open diabetes, asthma, anxiety who presents to the Emergency Department via EMS for intentional overdose.  Per [employer], patient reported taking 15 Vistaril and 15 of his trazodone about an hour and a half ago because he wanted to sleep all day.  They report that he said that he wanted to "go to sleep and never wake up".  He was much more alert on the scene, but shortly upon their arrival, became lethargic and was not responding as quickly.  Upon my evaluation, patient states that he took his own medication because he wanted to sleep all day today.  He denies that this was a suicide attempt to me, although it does appear that he mentioned this earlier to officers.  Level V caveat applies 2/2 altered mental status    Past Medical History:  Diagnosis Date  . ADD (attention deficit disorder)   . Allergy   . Anxiety   . Asthma   . Depression   . Epileptic seizure (HCC)    "both petit and grand mal; last sz ~ 2 wk ago" (06/17/2013)  . Headache(784.0)    "2-3 times/wk usually" (06/17/2013)  . Heart murmur    "heard a slight one earlier today" (06/17/2013)  . Homeless   . Migraine    "maybe once/month; it's severe" (06/17/2013)  . ODD (oppositional defiant disorder)   . Sickle cell trait (HCC)   . Type I diabetes mellitus (HCC)    "that's what they're thinking now" (06/17/2013)  . Vision abnormalities    "takes him longer to focus cause; from his sz" (06/17/2013)    Patient Active Problem List   Diagnosis Date Noted  . Borderline personality disorder (HCC)  02/26/2018  . 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 DKA, type 1 (HCC) 07/16/2017  . Nausea & vomiting 07/16/2017  . Uncontrolled type 1 diabetes circulatory disorder erectile dysfunction (HCC)   . DKA (diabetic ketoacidoses) (HCC) 12/18/2016  . History of seizures 12/01/2016  . History of migraine 12/01/2016  . Disordered eating 03/08/2015  . Ketonuria   . Adjustment reaction to medical therapy   . Non compliance w medication regimen   . 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 Dehydration 08/22/2014  . Hyperglycemia due to type 1 diabetes mellitus (HCC) 08/21/2014  . Type 1 diabetes mellitus with hyperglycemia (HCC)   . Noncompliance   . Generalized abdominal pain   . Glycosuria   . Depression 07/12/2014  . Involuntary movements 06/13/2014  . Bilateral leg pain 06/13/2014  . 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 Suicidal ideation 03/31/2014  . ODD (oppositional defiant disorder) 03/03/2014  . MDD (major depressive disorder), recurrent episode, moderate (HCC) 02/16/2014  . Attention deficit hyperactivity disorder (ADHD), combined type, moderate 02/16/2014  . Microcytic anemia 02/15/2014  . Hyperglycemia 02/14/2014  . Goiter 02/04/2014  . Peripheral autonomic  neuropathy due to diabetes mellitus (HCC) 02/04/2014  . Acquired acanthosis nigricans 02/04/2014  . Obesity, morbid (HCC) 02/04/2014  . Insulin resistance 02/04/2014  . Hyperinsulinemia 02/04/2014  . Hypoglycemia associated with diabetes (HCC) 02/02/2014  . Maladaptive health behaviors affecting medical condition 02/02/2014  . Hypoglycemia 02/02/2014  . Type 1 diabetes mellitus in patient 17 to 20 years of age with hemoglobin A1c goal of less than 7.5% (HCC) 01/26/2014  .  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  . 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 Migraine without aura 12/13/2013  . Body mass index, pediatric, greater than or equal to 95th percentile for age 52/16/2015  . Asthma 12/13/2013  . Hypoglycemia unawareness in type 1 diabetes mellitus (HCC) 08/08/2013  . Seizure disorder (HCC) 07/06/2013  . Short-term memory loss 07/06/2013  . Sickle cell trait (HCC) 07/06/2013  . Diabetes (HCC) 06/17/2013  . Diabetes mellitus, new onset (HCC) 06/17/2013    Past Surgical History:  Procedure Laterality Date  . CIRCUMCISION  2000  . FINGER SURGERY Left 2001   "crushed pinky; had to repair it" (06/17/2013)        Home Medications    Prior to Admission medications   Medication Sig Start Date End Date Taking? Authorizing Provider  insulin aspart (NOVOLOG FLEXPEN) 100 UNIT/ML FlexPen Inject 0-15 Units into the skin 3 (three) times daily with meals. (Sliding scale): For diabetes management 03/09/18  Yes Armandina Stammer I, NP  insulin glargine (LANTUS) 100 UNIT/ML injection Inject 0.3 mLs (30 Units total) into the skin 2 (two) times daily. 07/12/18  Yes Ward, Layla Maw, DO  insulin lispro (HUMALOG) 100 UNIT/ML injection Sliding scale insulin with meals 07/12/18  Yes Ward, Kristen N, DO  glucose blood (ACCU-CHEK GUIDE) test strip Check glucose 6x daily: For blood sugar checks 03/22/18   Long, Arlyss Repress, MD    Family History Family History  Problem Relation Age of Onset  . Asthma Mother   . COPD Mother   . Other Mother        possible autoimmune, unclear  . Diabetes Maternal Grandmother   . Heart disease Maternal Grandmother   . Hypertension Maternal Grandmother   . Mental illness Maternal Grandmother   . Heart disease Maternal Grandfather   . Hyperlipidemia Paternal Grandmother   . Hyperlipidemia Paternal Grandfather   . Migraines Sister        Hemiplegic Migraines     Social History Social History   Tobacco  Use  . Smoking status: Passive Smoke Exposure - Never Smoker  . Smokeless tobacco: Never Used  . Tobacco comment: Mom and dad smoke outside   Substance Use Topics  . Alcohol use: No    Alcohol/week: 0.0 standard drinks  . Drug use: No     Allergies   Bee venom; Fish allergy; and Shellfish allergy   Review of Systems Review of Systems  Unable to perform ROS: Mental status change     Physical Exam Updated Vital Signs BP (!) 115/59 (BP Location: Right Arm)   Pulse 92   Temp (!) 97.5 F (36.4 C) (Axillary) Comment: pt eating ice  Resp 16   SpO2 97%   Physical Exam Vitals signs and nursing note reviewed.  Constitutional:      General: He is not in acute distress.    Appearance: He is well-developed.     Comments: Somnolent, but easily arousable.  HENT:     Head: Normocephalic and atraumatic.  Neck:     Comments: No  midline tenderness. Cardiovascular:     Rate and Rhythm: Normal rate and regular rhythm.     Heart sounds: Normal heart sounds. No murmur.  Pulmonary:     Effort: Pulmonary effort is normal. No respiratory distress.     Breath sounds: Normal breath sounds.  Abdominal:     General: There is no distension.     Palpations: Abdomen is soft.     Tenderness: There is no abdominal tenderness.  Skin:    General: Skin is warm and dry.  Neurological:     Mental Status: He is alert.     Comments: Oriented x4.  Moves all extremities independently.  Slurred speech. CN 2-12 grossly intact.       ED Treatments / Results  Labs (all labs ordered are listed, but only abnormal results are displayed) Labs Reviewed  CBC WITH DIFFERENTIAL/PLATELET - Abnormal; Notable for the following components:      Result Value   RBC 6.06 (*)    MCV 78.7 (*)    All other components within normal limits  COMPREHENSIVE METABOLIC PANEL - Abnormal; Notable for the following components:   Sodium 130 (*)    Chloride 94 (*)    CO2 18 (*)    Glucose, Bld 547 (*)    Calcium 8.7 (*)     Total Bilirubin 1.9 (*)    Anion gap 18 (*)    All other components within normal limits  ACETAMINOPHEN LEVEL - Abnormal; Notable for the following components:   Acetaminophen (Tylenol), Serum <10 (*)    All other components within normal limits  COMPREHENSIVE METABOLIC PANEL - Abnormal; Notable for the following components:   Potassium 3.4 (*)    CO2 21 (*)    Glucose, Bld 221 (*)    Calcium 8.5 (*)    AST 14 (*)    Total Bilirubin 1.6 (*)    All other components within normal limits  ACETAMINOPHEN LEVEL - Abnormal; Notable for the following components:   Acetaminophen (Tylenol), Serum <10 (*)    All other components within normal limits  I-STAT CHEM 8, ED - Abnormal; Notable for the following components:   Sodium 129 (*)    Chloride 95 (*)    Creatinine, Ser 0.60 (*)    Glucose, Bld 594 (*)    Calcium, Ion 1.05 (*)    TCO2 20 (*)    All other components within normal limits  CBG MONITORING, ED - Abnormal; Notable for the following components:   Glucose-Capillary 499 (*)    All other components within normal limits  I-STAT VENOUS BLOOD GAS, ED - Abnormal; Notable for the following components:   pCO2, Ven 31.6 (*)    pO2, Ven 193.0 (*)    TCO2 21 (*)    Acid-base deficit 3.0 (*)    All other components within normal limits  CBG MONITORING, ED - Abnormal; Notable for the following components:   Glucose-Capillary 292 (*)    All other components within normal limits  ETHANOL  SALICYLATE LEVEL  RAPID URINE DRUG SCREEN, HOSP PERFORMED  CBG MONITORING, ED  CBG MONITORING, ED  CBG MONITORING, ED  CBG MONITORING, ED    EKG EKG Interpretation  Date/Time:  Wednesday July 14 2018 12:14:21 EST Ventricular Rate:  84 PR Interval:    QRS Duration: 87 QT Interval:  365 QTC Calculation: 432 R Axis:   79 Text Interpretation:  Sinus rhythm Borderline T abnormalities, inferior leads ST elev, probable normal early repol pattern No significant change  since last tracing  Confirmed by Jacalyn Lefevre [phone]) on 07/14/2018 12:20:32 PM Also confirmed by Jacalyn Lefevre [phone]), editor Barbette Hair [phone])  on 07/14/2018 3:10:20 PM   Radiology No results found.  Procedures Procedures (including critical care time)  Medications Ordered in ED Medications  insulin glargine (LANTUS) injection 30 Units (has no administration in time range)  insulin aspart (novoLOG) injection 0-15 Units (has no administration in time range)  sodium chloride 0.9 % bolus 1,000 mL (0 mLs Intravenous Stopped 07/14/18 1555)  sodium chloride 0.9 % bolus 1,000 mL (0 mLs Intravenous Stopped 07/14/18 1556)  insulin aspart (novoLOG) injection 8 Units (8 Units Intravenous Given 07/14/18 1555)  ondansetron (ZOFRAN) injection 4 mg (4 mg Intravenous Given 07/14/18 1635)     Initial Impression / Assessment and Plan / ED Course  I have reviewed the triage vital signs and the nursing notes.  Pertinent labs & imaging results that were available during my care of the patient were reviewed by me and considered in my medical decision making (see chart for details).    Jaki Faye Doar. is a 18 y.o. male who presents to ED for evaluation after intentional overdose on home trazodone and vistaril just prior to arrival.  On examination, he is afebrile, vital signs are stable.  He is somnolent, but easily arousable.  Per poison control, watch for respiratory depression, agitation and QT prolongation.  Repeat Tylenol level in 4 hours.  Observe for 6 hours or until asymptomatic. Labs reviewed.  He is a type I diabetic with hyperglycemia. Co2 and AG both at 18. Normal K+ at 4.2. Will hydrate and recheck CMP when labs are drawn for repeat acetaminophen level.  EtOH and coingestants negative. Repeat acetaminophen level negative. Repeat CMP much improved. Co2 now 21 with normal AG of 13. Glucose down to 221. Will place on home DM regimen. On re-evaluation, he is tolerating PO, ambulatory with steady gait requesting  something to eat - diet ordered. Medically cleared with dispo pending TTS recommendations.   Patient discussed with Dr. Particia Nearing who agrees with treatment plan.   Addendum: Patient requesting to leave. Seen by psych who recommends overnight observation. I spoke with the patient at length. He reports that he took 5 of his trazodone and 3 of his Vistaril because he was having a bad day and just wanted to go to sleep tonight.  He states that he just did not want to wake up until the morning, but had no intentions of hurting himself.  He adamantly denies that he is suicidal and states that he did not mean to overdose, but just really wanted to go to sleep.  We discussed the importance of taking medications as directed without overdosing as well as the importance of his hyperglycemia control.  After a long, thorough conversation, I do not believe that there is enough evidence to warrant involuntary commitment. I do not believe he is a threat to himself or others. This was discussed with attending, Dr. Effie Shy, who agrees. We discussed reasons to return to the emergency department, follow-up care.  All questions were answered and he was discharged in satisfactory condition.    Final Clinical Impressions(s) / ED Diagnoses   Final diagnoses:  Intentional drug overdose, initial encounter [facility])    ED Discharge Orders    None       Ward, Chase Picket, PA-C 07/14/18 1732      Ward, Chase Picket, PA-C 07/14/18 1942    Jacalyn Lefevre, MD 07/15/18 [phone]

## 2018-07-14 NOTE — ED Notes (Addendum)
Lab results reported to Nurse Crystal.

## 2018-07-21 ENCOUNTER — Encounter (HOSPITAL_COMMUNITY): Payer: Self-pay | Admitting: Emergency Medicine

## 2018-07-21 ENCOUNTER — Other Ambulatory Visit: Payer: Self-pay

## 2018-07-21 ENCOUNTER — Emergency Department (HOSPITAL_COMMUNITY)
Admission: EM | Admit: 2018-07-21 | Discharge: 2018-07-22 | Disposition: A | Discharge status: Home / Self Care | Payer: Medicaid Other | Attending: Emergency Medicine | Admitting: Emergency Medicine

## 2018-07-21 DIAGNOSIS — Z7722 Contact with and (suspected) exposure to environmental tobacco smoke (acute) (chronic): Secondary | ICD-10-CM | POA: Diagnosis not present

## 2018-07-21 DIAGNOSIS — Z79899 Other long term (current) drug therapy: Secondary | ICD-10-CM | POA: Diagnosis not present

## 2018-07-21 DIAGNOSIS — F909 Attention-deficit hyperactivity disorder, unspecified type: Secondary | ICD-10-CM | POA: Insufficient documentation

## 2018-07-21 DIAGNOSIS — Z794 Long term (current) use of insulin: Secondary | ICD-10-CM | POA: Diagnosis not present

## 2018-07-21 DIAGNOSIS — E1065 Type 1 diabetes mellitus with hyperglycemia: Secondary | ICD-10-CM | POA: Insufficient documentation

## 2018-07-21 DIAGNOSIS — J45909 Unspecified asthma, uncomplicated: Secondary | ICD-10-CM | POA: Insufficient documentation

## 2018-07-21 DIAGNOSIS — R739 Hyperglycemia, unspecified: Secondary | ICD-10-CM

## 2018-07-21 LAB — CBG MONITORING, ED: Glucose-Capillary: 451 mg/dL — ABNORMAL HIGH (ref 70–99)

## 2018-07-21 NOTE — ED Triage Notes (Signed)
Pt arrived [facility] from a hotel, pt hasn't had his insulin in 4 days. CBG with EMS 356 VSS BP129/88 P92 RR16 O297% RA. Pt received NS via 18G L Hand IV

## 2018-07-22 LAB — BASIC METABOLIC PANEL
Anion gap: 16 — ABNORMAL HIGH (ref 5–15)
BUN: 22 mg/dL — ABNORMAL HIGH (ref 6–20)
CO2: 21 mmol/L — AB (ref 22–32)
Calcium: 9 mg/dL (ref 8.9–10.3)
Chloride: 93 mmol/L — ABNORMAL LOW (ref 98–111)
Creatinine, Ser: 0.78 mg/dL (ref 0.61–1.24)
GFR calc Af Amer: >60 mL/min (ref >60)
GFR calc non Af Amer: >60 mL/min (ref >60)
Glucose, Bld: 452 mg/dL — ABNORMAL HIGH (ref 70–99)
Potassium: 3.5 mmol/L (ref 3.5–5.1)
Sodium: 130 mmol/L — ABNORMAL LOW (ref 135–145)

## 2018-07-22 LAB — CBC
HCT: 49.3 % (ref 39.0–52.0)
Hemoglobin: 17.1 g/dL — ABNORMAL HIGH (ref 13.0–17.0)
MCH: 26.6 pg (ref 26.0–34.0)
MCHC: 34.7 g/dL (ref 30.0–36.0)
MCV: 76.7 fL — AB (ref 80.0–100.0)
Platelets: 219 10*3/uL (ref 150–400)
RBC: 6.43 MIL/uL — ABNORMAL HIGH (ref 4.22–5.81)
RDW: 11.9 % (ref 11.5–15.5)
WBC: 8.9 10*3/uL (ref 4.0–10.5)
nRBC: 0 % (ref 0.0–0.2)

## 2018-07-22 LAB — POCT I-STAT EG7
Acid-Base Excess: 2 mmol/L (ref 0.0–2.0)
Bicarbonate: 28.4 mmol/L — ABNORMAL HIGH (ref 20.0–28.0)
CALCIUM ION: 1.22 mmol/L (ref 1.15–1.40)
HCT: 49 % (ref 39.0–52.0)
Hemoglobin: 16.7 g/dL (ref 13.0–17.0)
O2 Saturation: 46 %
POTASSIUM: 3.4 mmol/L — AB (ref 3.5–5.1)
Sodium: 133 mmol/L — ABNORMAL LOW (ref 135–145)
TCO2: 30 mmol/L (ref 22–32)
pCO2, Ven: 50.3 mmHg (ref 44.0–60.0)
pH, Ven: 7.36 (ref 7.250–7.430)
pO2, Ven: 27 mmHg — CL (ref 32.0–45.0)

## 2018-07-22 LAB — URINALYSIS, ROUTINE W REFLEX MICROSCOPIC
Bacteria, UA: NONE SEEN
Bilirubin Urine: NEGATIVE
Glucose, UA: >=500 mg/dL — AB
Hgb urine dipstick: NEGATIVE
Ketones, ur: 20 mg/dL — AB
Leukocytes, UA: NEGATIVE
NITRITE: NEGATIVE
PROTEIN: NEGATIVE mg/dL
Specific Gravity, Urine: 1.025 (ref 1.005–1.030)
pH: 6 (ref 5.0–8.0)

## 2018-07-22 LAB — CBG MONITORING, ED
Glucose-Capillary: 350 mg/dL — ABNORMAL HIGH (ref 70–99)
Glucose-Capillary: 288 mg/dL — ABNORMAL HIGH (ref 70–99)
Glucose-Capillary: 256 mg/dL — ABNORMAL HIGH (ref 70–99)

## 2018-07-22 MED ORDER — SODIUM CHLORIDE 0.9 % IV BOLUS: 1000.0000 mL | Freq: Once | INTRAVENOUS | Status: DC

## 2018-07-22 NOTE — ED Provider Notes (Signed)
[facility], [place] EMERGENCY DEPARTMENT Provider Note  CSN: [id number] Arrival date & time: 07/21/18 2331  Chief Complaint(s) Hyperglycemia  HPI [name] [name]. is a 18 y.o. male   The history is provided by the patient.  Hyperglycemia  Blood sugar level PTA:  "high" Severity:  Moderate Onset quality:  Gradual Duration:  2 days Timing:  Constant Chronicity:  Chronic Diabetes status:  Controlled with insulin Current diabetic therapy:  Lantus 35U bid. Novolog ssi Time since last antidiabetic medication:  4 days Context comment:  Financial contraints Relieved by:  Nothing Ineffective treatments:  None tried Associated symptoms: increased thirst   Associated symptoms: no dehydration, no fever, no nausea and no vomiting   Risk factors: hx of DKA     Past Medical History Past Medical History:  Diagnosis Date  . ADD (attention deficit disorder)   . Allergy   . Anxiety   . Asthma   . Depression   . Epileptic seizure (HCC)    "both petit and grand mal; last sz ~ 2 wk ago" (06/17/2013)  . Headache(784.0)    "2-3 times/wk usually" (06/17/2013)  . Heart murmur    "heard a slight one earlier today" (06/17/2013)  . Homeless   . Migraine    "maybe once/month; it's severe" (06/17/2013)  . ODD (oppositional defiant disorder)   . Sickle cell trait (HCC)   . Type I diabetes mellitus (HCC)    "that's what they're thinking now" (06/17/2013)  . Vision abnormalities    "takes him longer to focus cause; from his sz" (06/17/2013)   Patient Active Problem List   Diagnosis Date Noted  . Borderline personality disorder (HCC) 02/26/2018  . 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 DKA, type 1 (HCC) 07/16/2017  . Nausea & vomiting 07/16/2017  . Uncontrolled type 1 diabetes circulatory disorder erectile dysfunction (HCC)   . DKA  (diabetic ketoacidoses) (HCC) 12/18/2016  . History of seizures 12/01/2016  . History of migraine 12/01/2016  . Disordered eating 03/08/2015  . Ketonuria   . Adjustment reaction to medical therapy   . Non compliance w medication regimen   . 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 Dehydration 08/22/2014  . Hyperglycemia due to type 1 diabetes mellitus (HCC) 08/21/2014  . Type 1 diabetes mellitus with hyperglycemia (HCC)   . Noncompliance   . Generalized abdominal pain   . Glycosuria   . Depression 07/12/2014  . Involuntary movements 06/13/2014  . Bilateral leg pain 06/13/2014  . 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 Suicidal ideation 03/31/2014  . ODD (oppositional defiant disorder) 03/03/2014  . MDD (major depressive disorder), recurrent episode, moderate (HCC) 02/16/2014  . Attention deficit hyperactivity disorder (ADHD), combined type, moderate 02/16/2014  . Microcytic anemia 02/15/2014  . Hyperglycemia 02/14/2014  . Goiter 02/04/2014  . 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 Acquired acanthosis nigricans 02/04/2014  . Obesity, morbid (HCC) 02/04/2014  . Insulin resistance 02/04/2014  . Hyperinsulinemia 02/04/2014  . Hypoglycemia associated with diabetes (HCC) 02/02/2014  . Maladaptive health behaviors affecting medical condition 02/02/2014  . Hypoglycemia 02/02/2014  . Type 1 diabetes mellitus in patient 60 to 20 years of age with hemoglobin A1c goal of less than 7.5% (HCC) 01/26/2014  . 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  . Generalized convulsive  epilepsy (HCC) 12/13/2013  . Migraine without aura 12/13/2013  . Body mass index, pediatric, greater than or equal to 95th percentile for age 22/16/2015  . Asthma 12/13/2013  . Hypoglycemia unawareness in type 1 diabetes mellitus (HCC) 08/08/2013  .  Seizure disorder (HCC) 07/06/2013  . Short-term memory loss 07/06/2013  . Sickle cell trait (HCC) 07/06/2013  . Diabetes (HCC) 06/17/2013  . Diabetes mellitus, new onset (HCC) 06/17/2013   Home Medication(s) Prior to Admission medications   Medication Sig Start Date End Date Taking? Authorizing Provider  albuterol (PROVENTIL HFA;VENTOLIN HFA) 108 (90 Base) MCG/ACT inhaler Inhale 1 puff into the lungs every 4 (four) hours as needed for wheezing or shortness of breath.   Yes [provider]  hydrOXYzine (ATARAX/VISTARIL) 50 MG tablet Take 50 mg by mouth daily as needed for anxiety.   Yes [provider]  insulin aspart (NOVOLOG FLEXPEN) 100 UNIT/ML FlexPen Inject 0-15 Units into the skin 3 (three) times daily with meals. (Sliding scale): For diabetes management 03/09/18  Yes Armandina Stammer I, NP  insulin glargine (LANTUS) 100 UNIT/ML injection Inject 0.3 mLs (30 Units total) into the skin 2 (two) times daily. 07/12/18  Yes Ward, Layla Maw, DO  traZODone (DESYREL) 50 MG tablet Take 50 mg by mouth at bedtime as needed for sleep.   Yes [provider]  glucose blood (ACCU-CHEK GUIDE) test strip Check glucose 6x daily: For blood sugar checks 03/22/18   Long, Arlyss Repress, MD  insulin lispro (HUMALOG) 100 UNIT/ML injection Sliding scale insulin with meals Patient not taking: Reported on 07/22/2018 07/12/18   Ward, Layla Maw, DO                                                                                                                                    Past Surgical History Past Surgical History:  Procedure Laterality Date  . CIRCUMCISION  2000  . FINGER SURGERY Left 2001   "crushed pinky; had to repair it" (06/17/2013)   Family History Family History  Problem Relation Age of Onset  . Asthma Mother   . COPD Mother   . Other Mother        possible autoimmune, unclear  . Diabetes Maternal Grandmother   . Heart disease Maternal Grandmother   . Hypertension Maternal  Grandmother   . Mental illness Maternal Grandmother   . Heart disease Maternal Grandfather   . Hyperlipidemia Paternal Grandmother   . Hyperlipidemia Paternal Grandfather   . Migraines Sister        Hemiplegic Migraines     Social History Social History   Tobacco Use  . Smoking status: Passive Smoke Exposure - Never Smoker  . Smokeless tobacco: Never Used  . Tobacco comment: Mom and dad smoke outside   Substance Use Topics  . Alcohol use: No    Alcohol/week: 0.0 standard drinks  . Drug use: No   Allergies Bee venom; Fish allergy; and  Shellfish allergy  Review of Systems Review of Systems  Constitutional: Negative for fever.  Gastrointestinal: Negative for nausea and vomiting.  Endocrine: Positive for polydipsia.   All other systems are reviewed and are negative for acute change except as noted in the HPI  Physical Exam Vital Signs  I have reviewed the triage vital signs BP (!) 149/99 (BP Location: Right Arm)   Pulse 90   Temp 98.2 F (36.8 C) (Oral)   Resp 18   SpO2 97%   Physical Exam Vitals signs reviewed.  Constitutional:      General: He is not in acute distress.    Appearance: He is well-developed. He is not diaphoretic.  HENT:     Head: Normocephalic and atraumatic.     Jaw: No trismus.     Right Ear: External ear normal.     Left Ear: External ear normal.     Nose: Nose normal.  Eyes:     General: No scleral icterus.    Conjunctiva/sclera: Conjunctivae normal.  Neck:     Musculoskeletal: Normal range of motion.     Trachea: Phonation normal.  Cardiovascular:     Rate and Rhythm: Normal rate and regular rhythm.  Pulmonary:     Effort: Pulmonary effort is normal. No respiratory distress.     Breath sounds: No stridor.  Abdominal:     General: There is no distension.     Tenderness: There is no abdominal tenderness.  Musculoskeletal: Normal range of motion.  Neurological:     Mental Status: He is alert and oriented to person, place, and time.    Psychiatric:        Behavior: Behavior normal.     ED Results and Treatments Labs (all labs ordered are listed, but only abnormal results are displayed) Labs Reviewed  BASIC METABOLIC PANEL - Abnormal; Notable for the following components:      Result Value   Sodium 130 (*)    Chloride 93 (*)    CO2 21 (*)    Glucose, Bld 452 (*)    BUN 22 (*)    Anion gap 16 (*)    All other components within normal limits  CBC - Abnormal; Notable for the following components:   RBC 6.43 (*)    Hemoglobin 17.1 (*)    MCV 76.7 (*)    All other components within normal limits  URINALYSIS, ROUTINE W REFLEX MICROSCOPIC - Abnormal; Notable for the following components:   Color, Urine COLORLESS (*)    Glucose, UA >=500 (*)    Ketones, ur 20 (*)    All other components within normal limits  CBG MONITORING, ED - Abnormal; Notable for the following components:   Glucose-Capillary 451 (*)    All other components within normal limits  CBG MONITORING, ED - Abnormal; Notable for the following components:   Glucose-Capillary 350 (*)    All other components within normal limits  CBG MONITORING, ED - Abnormal; Notable for the following components:   Glucose-Capillary 288 (*)    All other components within normal limits  POCT I-STAT EG7 - Abnormal; Notable for the following components:   pO2, Ven 27.0 (*)    Bicarbonate 28.4 (*)    Sodium 133 (*)    Potassium 3.4 (*)    All other components within normal limits  CBG MONITORING, ED  CBG MONITORING, ED  EKG  EKG Interpretation  Date/Time:    Ventricular Rate:    PR Interval:    QRS Duration:   QT Interval:    QTC Calculation:   R Axis:     Text Interpretation:        Radiology No results found. Pertinent labs & imaging results that were available during my care of the patient were reviewed by me and considered in my  medical decision making (see chart for details).  Medications Ordered in ED Medications  sodium chloride 0.9 % bolus 1,000 mL (1,000 mLs Intravenous Bolus 07/22/18 0200)                                                                                                                                    Procedures Procedures  (including critical care time)  Medical Decision Making / ED Course I have reviewed the nursing notes for this encounter and the patient's prior records (if available in EHR or on provided paperwork).    Patient presents with hyperglycemia in the setting of noncompliance due to financial constraints.   Screening labs without evidence of DKA or HHS.  Patient provided with IV fluids and subcu insulin.  Improved blood sugar levels.  No evidence of infectious process noted.  The patient appears reasonably screened and/or stabilized for discharge and I doubt any other medical condition or other [facility] requiring further screening, evaluation, or treatment in the ED at this time prior to discharge.  The patient is safe for discharge with strict return precautions.   Final Clinical Impression(s) / ED Diagnoses Final diagnoses:  Hyperglycemia    Disposition: Discharge  Condition: Good  I have discussed the results, Dx and Tx plan with the patient who expressed understanding and agree(s) with the plan. Discharge instructions discussed at great length. The patient was given strict return precautions who verbalized understanding of the instructions. No further questions at time of discharge.    ED Discharge Orders    None       Follow Up: Hilbert Corrigan Chales Abrahams, NP [address] [place] [address] [phone]  Schedule an appointment as soon as possible for a visit  As needed     This chart was dictated using voice recognition software.  Despite best efforts to proofread,  errors can occur which can change the documentation meaning.   [name], MD 07/22/18 [phone]

## 2018-08-09 ENCOUNTER — Other Ambulatory Visit: Payer: Self-pay

## 2018-08-09 ENCOUNTER — Inpatient Hospital Stay (HOSPITAL_COMMUNITY): Payer: Medicaid Other

## 2018-08-09 ENCOUNTER — Inpatient Hospital Stay (HOSPITAL_COMMUNITY)
Admission: EM | Admit: 2018-08-09 | Discharge: 2018-08-09 | DRG: 637 | Discharge status: Against Medical Advice | Payer: Medicaid Other | Attending: Internal Medicine | Admitting: Internal Medicine

## 2018-08-09 ENCOUNTER — Encounter (HOSPITAL_COMMUNITY): Payer: Self-pay

## 2018-08-09 DIAGNOSIS — E0810 Diabetes mellitus due to underlying condition with ketoacidosis without coma: Secondary | ICD-10-CM | POA: Diagnosis not present

## 2018-08-09 DIAGNOSIS — E1010 Type 1 diabetes mellitus with ketoacidosis without coma: Principal | ICD-10-CM | POA: Diagnosis present

## 2018-08-09 DIAGNOSIS — F988 Other specified behavioral and emotional disorders with onset usually occurring in childhood and adolescence: Secondary | ICD-10-CM | POA: Diagnosis present

## 2018-08-09 DIAGNOSIS — E860 Dehydration: Secondary | ICD-10-CM | POA: Diagnosis present

## 2018-08-09 DIAGNOSIS — F603 Borderline personality disorder: Secondary | ICD-10-CM | POA: Diagnosis present

## 2018-08-09 DIAGNOSIS — E109 Type 1 diabetes mellitus without complications: Secondary | ICD-10-CM

## 2018-08-09 DIAGNOSIS — E871 Hypo-osmolality and hyponatremia: Secondary | ICD-10-CM

## 2018-08-09 DIAGNOSIS — Z91013 Allergy to seafood: Secondary | ICD-10-CM | POA: Diagnosis not present

## 2018-08-09 DIAGNOSIS — Z833 Family history of diabetes mellitus: Secondary | ICD-10-CM | POA: Diagnosis not present

## 2018-08-09 DIAGNOSIS — E1110 Type 2 diabetes mellitus with ketoacidosis without coma: Secondary | ICD-10-CM | POA: Diagnosis present

## 2018-08-09 DIAGNOSIS — D573 Sickle-cell trait: Secondary | ICD-10-CM | POA: Diagnosis present

## 2018-08-09 DIAGNOSIS — Z825 Family history of asthma and other chronic lower respiratory diseases: Secondary | ICD-10-CM

## 2018-08-09 DIAGNOSIS — F331 Major depressive disorder, recurrent, moderate: Secondary | ICD-10-CM | POA: Diagnosis present

## 2018-08-09 DIAGNOSIS — Z9119 Patient's noncompliance with other medical treatment and regimen: Secondary | ICD-10-CM

## 2018-08-09 DIAGNOSIS — I213 ST elevation (STEMI) myocardial infarction of unspecified site: Secondary | ICD-10-CM | POA: Diagnosis present

## 2018-08-09 DIAGNOSIS — Z91030 Bee allergy status: Secondary | ICD-10-CM

## 2018-08-09 DIAGNOSIS — Z79899 Other long term (current) drug therapy: Secondary | ICD-10-CM

## 2018-08-09 DIAGNOSIS — R079 Chest pain, unspecified: Secondary | ICD-10-CM

## 2018-08-09 DIAGNOSIS — J45909 Unspecified asthma, uncomplicated: Secondary | ICD-10-CM | POA: Diagnosis present

## 2018-08-09 DIAGNOSIS — F419 Anxiety disorder, unspecified: Secondary | ICD-10-CM | POA: Diagnosis present

## 2018-08-09 DIAGNOSIS — G40909 Epilepsy, unspecified, not intractable, without status epilepticus: Secondary | ICD-10-CM | POA: Diagnosis present

## 2018-08-09 DIAGNOSIS — Z794 Long term (current) use of insulin: Secondary | ICD-10-CM

## 2018-08-09 DIAGNOSIS — R9431 Abnormal electrocardiogram [ECG] [EKG]: Secondary | ICD-10-CM

## 2018-08-09 DIAGNOSIS — F913 Oppositional defiant disorder: Secondary | ICD-10-CM | POA: Diagnosis present

## 2018-08-09 LAB — PHOSPHORUS: Phosphorus: 4.2 mg/dL (ref 2.5–4.6)

## 2018-08-09 LAB — HEMOGLOBIN A1C
Hgb A1c MFr Bld: 11.5 % — ABNORMAL HIGH (ref 4.8–5.6)
Mean Plasma Glucose: 283.35 mg/dL

## 2018-08-09 LAB — CBC
HCT: 47.1 % (ref 39.0–52.0)
HEMATOCRIT: 49.6 % (ref 39.0–52.0)
HEMOGLOBIN: 16.3 g/dL (ref 13.0–17.0)
Hemoglobin: 15.9 g/dL (ref 13.0–17.0)
MCH: 26.9 pg (ref 26.0–34.0)
MCH: 27.6 pg (ref 26.0–34.0)
MCHC: 32.9 g/dL (ref 30.0–36.0)
MCHC: 33.8 g/dL (ref 30.0–36.0)
MCV: 82 fL (ref 80.0–100.0)
MCV: 81.8 fL (ref 80.0–100.0)
Platelets: 258 10*3/uL (ref 150–400)
Platelets: 260 10*3/uL (ref 150–400)
RBC: 5.76 MIL/uL (ref 4.22–5.81)
RBC: 6.05 MIL/uL — AB (ref 4.22–5.81)
RDW: 13 % (ref 11.5–15.5)
RDW: 12.9 % (ref 11.5–15.5)
WBC: 6.1 10*3/uL (ref 4.0–10.5)
WBC: 8.4 10*3/uL (ref 4.0–10.5)
nRBC: 0 % (ref 0.0–0.2)
nRBC: 0 % (ref 0.0–0.2)

## 2018-08-09 LAB — TROPONIN I
Troponin I: <0.03 ng/mL (ref <0.03)
Troponin I: <0.03 ng/mL (ref <0.03)
Troponin I: <0.03 ng/mL (ref <0.03)
Troponin I: <0.03 ng/mL (ref <0.03)

## 2018-08-09 LAB — URINALYSIS, ROUTINE W REFLEX MICROSCOPIC
BACTERIA UA: NONE SEEN
Bilirubin Urine: NEGATIVE
Glucose, UA: >=500 mg/dL — AB
Hgb urine dipstick: NEGATIVE
Ketones, ur: 80 mg/dL — AB
Leukocytes, UA: NEGATIVE
Nitrite: NEGATIVE
Protein, ur: NEGATIVE mg/dL
Specific Gravity, Urine: 1.02 (ref 1.005–1.030)
pH: 5 (ref 5.0–8.0)

## 2018-08-09 LAB — BASIC METABOLIC PANEL
ANION GAP: 21 — AB (ref 5–15)
ANION GAP: 9 (ref 5–15)
Anion gap: 14 (ref 5–15)
Anion gap: 11 (ref 5–15)
BUN: 18 mg/dL (ref 6–20)
BUN: 22 mg/dL — ABNORMAL HIGH (ref 6–20)
BUN: 14 mg/dL (ref 6–20)
BUN: 14 mg/dL (ref 6–20)
CO2: 19 mmol/L — ABNORMAL LOW (ref 22–32)
CO2: 15 mmol/L — ABNORMAL LOW (ref 22–32)
CO2: 26 mmol/L (ref 22–32)
CO2: 22 mmol/L (ref 22–32)
Calcium: 8.5 mg/dL — ABNORMAL LOW (ref 8.9–10.3)
Calcium: 8.4 mg/dL — ABNORMAL LOW (ref 8.9–10.3)
Calcium: 8.4 mg/dL — ABNORMAL LOW (ref 8.9–10.3)
Calcium: 8.4 mg/dL — ABNORMAL LOW (ref 8.9–10.3)
Chloride: 100 mmol/L (ref 98–111)
Chloride: 84 mmol/L — ABNORMAL LOW (ref 98–111)
Chloride: 104 mmol/L (ref 98–111)
Chloride: 100 mmol/L (ref 98–111)
Creatinine, Ser: 0.76 mg/dL (ref 0.61–1.24)
Creatinine, Ser: 1.21 mg/dL (ref 0.61–1.24)
Creatinine, Ser: 0.69 mg/dL (ref 0.61–1.24)
Creatinine, Ser: 0.69 mg/dL (ref 0.61–1.24)
GFR calc Af Amer: >60 mL/min (ref >60)
GFR calc Af Amer: >60 mL/min (ref >60)
GFR calc Af Amer: >60 mL/min (ref >60)
GFR calc Af Amer: >60 mL/min (ref >60)
GFR calc non Af Amer: >60 mL/min (ref >60)
GFR calc non Af Amer: >60 mL/min (ref >60)
GFR calc non Af Amer: >60 mL/min (ref >60)
GFR calc non Af Amer: >60 mL/min (ref >60)
Glucose, Bld: 241 mg/dL — ABNORMAL HIGH (ref 70–99)
Glucose, Bld: 959 mg/dL (ref 70–99)
Glucose, Bld: 92 mg/dL (ref 70–99)
Glucose, Bld: 371 mg/dL — ABNORMAL HIGH (ref 70–99)
Potassium: 3.4 mmol/L — ABNORMAL LOW (ref 3.5–5.1)
Potassium: 5 mmol/L (ref 3.5–5.1)
Potassium: 3.3 mmol/L — ABNORMAL LOW (ref 3.5–5.1)
Potassium: 4.4 mmol/L (ref 3.5–5.1)
Sodium: 133 mmol/L — ABNORMAL LOW (ref 135–145)
Sodium: 120 mmol/L — ABNORMAL LOW (ref 135–145)
Sodium: 139 mmol/L (ref 135–145)
Sodium: 133 mmol/L — ABNORMAL LOW (ref 135–145)

## 2018-08-09 LAB — BLOOD GAS, VENOUS
Acid-base deficit: 8.3 mmol/L — ABNORMAL HIGH (ref 0.0–2.0)
Bicarbonate: 16.3 mmol/L — ABNORMAL LOW (ref 20.0–28.0)
FIO2: 21
O2 Saturation: 94.3 %
Patient temperature: 98.6
pCO2, Ven: 32.5 mmHg — ABNORMAL LOW (ref 44.0–60.0)
pH, Ven: 7.32 (ref 7.250–7.430)
pO2, Ven: 78.2 mmHg — ABNORMAL HIGH (ref 32.0–45.0)

## 2018-08-09 LAB — URINALYSIS, COMPLETE (UACMP) WITH MICROSCOPIC
Bacteria, UA: NONE SEEN
Bilirubin Urine: NEGATIVE
Glucose, UA: >=500 mg/dL — AB
Hgb urine dipstick: NEGATIVE
Ketones, ur: 20 mg/dL — AB
Leukocytes, UA: NEGATIVE
Nitrite: NEGATIVE
Protein, ur: NEGATIVE mg/dL
Specific Gravity, Urine: 1.022 (ref 1.005–1.030)
pH: 6 (ref 5.0–8.0)

## 2018-08-09 LAB — GLUCOSE, CAPILLARY
GLUCOSE-CAPILLARY: 218 mg/dL — AB (ref 70–99)
GLUCOSE-CAPILLARY: 152 mg/dL — AB (ref 70–99)
Glucose-Capillary: 352 mg/dL — ABNORMAL HIGH (ref 70–99)
Glucose-Capillary: 272 mg/dL — ABNORMAL HIGH (ref 70–99)
Glucose-Capillary: 238 mg/dL — ABNORMAL HIGH (ref 70–99)
Glucose-Capillary: 137 mg/dL — ABNORMAL HIGH (ref 70–99)
Glucose-Capillary: 175 mg/dL — ABNORMAL HIGH (ref 70–99)
Glucose-Capillary: 406 mg/dL — ABNORMAL HIGH (ref 70–99)
Glucose-Capillary: 280 mg/dL — ABNORMAL HIGH (ref 70–99)

## 2018-08-09 LAB — RAPID URINE DRUG SCREEN, HOSP PERFORMED
AMPHETAMINES: NOT DETECTED
Barbiturates: NOT DETECTED
Benzodiazepines: NOT DETECTED
Cocaine: NOT DETECTED
Opiates: POSITIVE — AB
Tetrahydrocannabinol: NOT DETECTED

## 2018-08-09 LAB — CBG MONITORING, ED
Glucose-Capillary: >600 mg/dL (ref 70–99)
Glucose-Capillary: >600 mg/dL (ref 70–99)

## 2018-08-09 LAB — HIV ANTIBODY (ROUTINE TESTING W REFLEX): HIV Screen 4th Generation wRfx: NONREACTIVE

## 2018-08-09 LAB — MAGNESIUM: Magnesium: 2.3 mg/dL (ref 1.7–2.4)

## 2018-08-09 LAB — MRSA PCR SCREENING: MRSA by PCR: NEGATIVE

## 2018-08-09 MED ORDER — SODIUM CHLORIDE 0.9 % IV SOLN
INTRAVENOUS | Status: DC
  Administered 2018-08-09: 04:00:00 via INTRAVENOUS

## 2018-08-09 MED ORDER — POTASSIUM CHLORIDE CRYS ER 20 MEQ PO TBCR
40.0000 meq | EXTENDED_RELEASE_TABLET | Freq: Once | ORAL | Status: AC
  Administered 2018-08-09: 40 meq via ORAL
  Filled 2018-08-09: qty 2

## 2018-08-09 MED ORDER — INSULIN GLARGINE 100 UNIT/ML ~~LOC~~ SOLN
18.0000 [IU] | Freq: Two times a day (BID) | SUBCUTANEOUS | Status: DC
  Filled 2018-08-09: qty 0.18

## 2018-08-09 MED ORDER — IBUPROFEN 200 MG PO TABS: 400.0000 mg | ORAL_TABLET | Freq: Four times a day (QID) | ORAL | Status: DC | PRN

## 2018-08-09 MED ORDER — TRAMADOL HCL 50 MG PO TABS
50.0000 mg | ORAL_TABLET | Freq: Four times a day (QID) | ORAL | Status: DC | PRN
  Administered 2018-08-09: 50 mg via ORAL
  Filled 2018-08-09: qty 1

## 2018-08-09 MED ORDER — DEXTROSE-NACL 5-0.45 % IV SOLN: INTRAVENOUS | Status: DC

## 2018-08-09 MED ORDER — INSULIN ASPART 100 UNIT/ML ~~LOC~~ SOLN
0.0000 [IU] | Freq: Three times a day (TID) | SUBCUTANEOUS | Status: DC
  Administered 2018-08-09: 15 [IU] via SUBCUTANEOUS
  Administered 2018-08-09: 8 [IU] via SUBCUTANEOUS

## 2018-08-09 MED ORDER — INSULIN REGULAR(HUMAN) IN NACL 100-0.9 UT/100ML-% IV SOLN
INTRAVENOUS | Status: DC
  Administered 2018-08-09: 5.4 [IU]/h via INTRAVENOUS
  Filled 2018-08-09: qty 100

## 2018-08-09 MED ORDER — MORPHINE SULFATE (PF) 2 MG/ML IV SOLN
2.0000 mg | Freq: Once | INTRAVENOUS | Status: AC
  Administered 2018-08-09: 2 mg via INTRAVENOUS
  Filled 2018-08-09: qty 1

## 2018-08-09 MED ORDER — SODIUM CHLORIDE 0.9 % IV BOLUS
1000.0000 mL | Freq: Once | INTRAVENOUS | Status: AC
  Administered 2018-08-09: 1000 mL via INTRAVENOUS

## 2018-08-09 MED ORDER — NITROGLYCERIN 0.4 MG SL SUBL
0.4000 mg | SUBLINGUAL_TABLET | SUBLINGUAL | Status: DC | PRN
  Administered 2018-08-09 (×3): 0.4 mg via SUBLINGUAL
  Filled 2018-08-09: qty 1

## 2018-08-09 MED ORDER — TRAZODONE HCL 50 MG PO TABS: 100.0000 mg | ORAL_TABLET | Freq: Every day | ORAL | Status: DC

## 2018-08-09 MED ORDER — INSULIN GLARGINE 100 UNIT/ML ~~LOC~~ SOLN
15.0000 [IU] | Freq: Every day | SUBCUTANEOUS | Status: DC
  Administered 2018-08-09: 15 [IU] via SUBCUTANEOUS
  Filled 2018-08-09: qty 0.15

## 2018-08-09 MED ORDER — INSULIN ASPART 100 UNIT/ML IV SOLN
10.0000 [IU] | Freq: Once | INTRAVENOUS | Status: DC
  Filled 2018-08-09: qty 0.1

## 2018-08-09 MED ORDER — ALBUTEROL SULFATE (2.5 MG/3ML) 0.083% IN NEBU: 3.0000 mL | INHALATION_SOLUTION | RESPIRATORY_TRACT | Status: DC | PRN

## 2018-08-09 MED ORDER — INSULIN GLARGINE 100 UNIT/ML ~~LOC~~ SOLN
22.0000 [IU] | Freq: Two times a day (BID) | SUBCUTANEOUS | Status: DC
  Filled 2018-08-09: qty 0.22

## 2018-08-09 MED ORDER — KETOROLAC TROMETHAMINE 30 MG/ML IJ SOLN
30.0000 mg | Freq: Once | INTRAMUSCULAR | Status: AC
  Administered 2018-08-09: 30 mg via INTRAVENOUS
  Filled 2018-08-09: qty 1

## 2018-08-09 MED ORDER — INSULIN REGULAR(HUMAN) IN NACL 100-0.9 UT/100ML-% IV SOLN: INTRAVENOUS | Status: DC

## 2018-08-09 MED ORDER — INSULIN ASPART 100 UNIT/ML ~~LOC~~ SOLN
8.0000 [IU] | Freq: Three times a day (TID) | SUBCUTANEOUS | Status: DC
  Administered 2018-08-09: 8 [IU] via SUBCUTANEOUS

## 2018-08-09 MED ORDER — ENOXAPARIN SODIUM 40 MG/0.4ML ~~LOC~~ SOLN: 40.0000 mg | Freq: Every day | SUBCUTANEOUS | Status: DC

## 2018-08-09 MED ORDER — DEXTROSE-NACL 5-0.45 % IV SOLN
INTRAVENOUS | Status: DC
  Administered 2018-08-09: 08:00:00 via INTRAVENOUS

## 2018-08-09 MED ORDER — INSULIN ASPART 100 UNIT/ML ~~LOC~~ SOLN: 0.0000 [IU] | Freq: Every day | SUBCUTANEOUS | Status: DC

## 2018-08-09 NOTE — ED Notes (Signed)
ED TO INPATIENT HANDOFF REPORT  Name/Age/Gender [name] [name]. 18 y.o. male  Code Status    Code Status Orders  (From admission, onward)         Start     Ordered   08/09/18 0322  Full code  Continuous     08/09/18 0322        Code Status History    Date Active Date Inactive Code Status Order ID Comments User Context   07/14/2018 1728 07/14/2018 2303 Full Code [id number]  Ward, Chase Picket, PA-C ED   04/14/2018 2323 04/19/2018 2143 Full Code [id number]  Elpidio Anis, PA-C ED   04/04/2018 1049 04/05/2018 2218 Full Code [id number]  Myrtie Neither, MD ED   04/02/2018 1311 04/03/2018 0010 Full Code [id number]  Elgergawy, Leana Roe, MD Inpatient   03/11/2018 2143 03/12/2018 1450 Full Code [id number]  Linwood Dibbles, MD ED   02/25/2018 1556 03/09/2018 1839 Full Code [id number]  Rankin, [name], NP Inpatient   02/23/2018 2051 02/25/2018 1511 Full Code [id number]  Alveria Apley, PA-C ED   08/14/2017 2058 08/17/2017 2012 Full Code [id number]  Georgiana Shore, PA-C ED   08/11/2017 2031 08/12/2017 1453 Full Code [id number]  Shaune Pollack, MD ED   07/29/2017 1628 08/08/2017 0830 Full Code [id number]  Laveda Abbe, NP Inpatient   07/24/2017 1954 07/26/2017 1041 Full Code [id number]  Charm Rings, NP Inpatient   07/24/2017 0716 07/24/2017 1923 Full Code [id number]  Pricilla Loveless, MD ED   07/16/2017 2203 07/18/2017 2151 Full Code [id number]  Eduard Clos, MD ED   01/26/2017 1130 01/29/2017 1758 Full Code [id number]  Deno Lunger, MD ED   12/18/2016 1529 12/21/2016 1625 Full Code [id number]  Bobette Mo, MD ED   04/17/2016 1707 05/08/2016 1235 Full Code [id number]  Lyndal Pulley, MD ED   03/10/2015 2117 03/17/2015 0000 Full Code [id number]  Earley Favor, NP ED   02/19/2015 1545 02/26/2015 1952 Full Code [id number]  [place], Katherine Inpatient   02/19/2015 1541 02/19/2015 1545 Full Code [id number]  [place], Katherine Inpatient   08/21/2014 2241 08/26/2014 1507 Full Code [id number]  Joanna Puff, MD  Inpatient   04/06/2014 1638 04/13/2014 1627 Full Code [id number]  Chauncey Mann, MD Inpatient   03/31/2014 1643 04/06/2014 1638 Full Code [id number]  Sherrin Daisy, MD ED   03/22/2014 2252 03/31/2014 1643 Full Code [id number]  Antony Madura, PA-C ED   03/03/2014 0502 03/09/2014 1554 Full Code [id number]  Alfredo Bach, RN Inpatient   02/16/2014 1652 02/23/2014 1903 Full Code [id number]  Chauncey Mann, MD Inpatient   02/14/2014 2105 02/16/2014 1652 Full Code [id number]  Nada Boozer, MD Inpatient   02/02/2014 1621 02/06/2014 1901 Full Code [id number]  Birder Robson, MD Inpatient   06/17/2013 2139 06/21/2013 1900 Full Code [id number]  Arley Phenix, MD ED      Home/SNF/Other Home  Chief Complaint hyperglycemia  Level of Care/Admitting Diagnosis ED Disposition    ED Disposition Condition Comment   Admit  Hospital Area: [facility] [[id number]]  Level of Care: Stepdown [14]  Admit to SDU based on following criteria: Severe physiological/psychological symptoms:  Any diagnosis requiring assessment & intervention at least every 4 hours on an ongoing basis to obtain desired patient outcomes including stability and rehabilitation  Diagnosis: DKA, type 1 [facility]) [[id number]]  Admitting Physician: Eduard Clos [phone]  Attending Physician: Eduard Clos [phone]  Estimated length of stay: past midnight tomorrow  Certification:: I certify this patient will  need inpatient services for at least 2 midnights  PT Class (Do Not Modify): Inpatient [101]  PT Acc Code (Do Not Modify): Private [1]       Medical History Past Medical History:  Diagnosis Date  . ADD (attention deficit disorder)   . Allergy   . Anxiety   . Asthma   . Depression   . Epileptic seizure (HCC)    "both petit and grand mal; last sz ~ 2 wk ago" (06/17/2013)  . Headache(784.0)    "2-3 times/wk usually" (06/17/2013)  . Heart murmur    "heard a slight one earlier today" (06/17/2013)  .  Homeless   . Migraine    "maybe once/month; it's severe" (06/17/2013)  . ODD (oppositional defiant disorder)   . Sickle cell trait (HCC)   . Type I diabetes mellitus (HCC)    "that's what they're thinking now" (06/17/2013)  . Vision abnormalities    "takes him longer to focus cause; from his sz" (06/17/2013)    Allergies Allergies  Allergen Reactions  . Bee Venom Anaphylaxis  . Fish Allergy Anaphylaxis  . Shellfish Allergy Anaphylaxis    IV Location/Drains/Wounds Patient Lines/Drains/Airways Status   Active Line/Drains/Airways    Name:   Placement date:   Placement time:   Site:   Days:   Peripheral IV 08/09/18 Right Wrist   08/09/18    0249    Wrist   less than 1   Peripheral IV 08/09/18 Right Antecubital   08/09/18    0401    Antecubital   less than 1   Wound / Incision (Open or Dehisced) 04/04/18 Non-pressure wound Foot Right   04/04/18    1900    Foot   127          Labs/Imaging Results for orders placed or performed during the hospital encounter of 08/09/18 (from the past 48 hour(s))  Basic metabolic panel     Status: Abnormal   Collection Time: 08/09/18 12:42 AM  Result Value Ref Range   Sodium 120 (L) 135 - 145 mmol/L   Potassium 5.0 3.5 - 5.1 mmol/L   Chloride 84 (L) 98 - 111 mmol/L   CO2 15 (L) 22 - 32 mmol/L   Glucose, Bld 959 (HH) 70 - 99 mg/dL    Comment: CRITICAL RESULT CALLED TO, READ BACK BY AND VERIFIED WITH: JAKE TALKINGTON AT 0222 ON 08/09/18 BY A,MOHAMED    BUN 22 (H) 6 - 20 mg/dL   Creatinine, Ser 6.26 0.61 - 1.24 mg/dL   Calcium 8.4 (L) 8.9 - 10.3 mg/dL   GFR calc non Af Amer >60 >60 mL/min   GFR calc Af Amer >60 >60 mL/min   Anion gap 21 (H) 5 - 15    Comment: Performed at [facility], [address], [place], [place] [address]  CBC     Status: Abnormal   Collection Time: 08/09/18 12:42 AM  Result Value Ref Range   WBC 6.1 4.0 - 10.5 K/uL   RBC 6.05 (H) 4.22 - 5.81 MIL/uL   Hemoglobin 16.3 13.0 - 17.0 g/dL   HCT 62.7  03.5 - 00.9 %   MCV 82.0 80.0 - 100.0 fL   MCH 26.9 26.0 - 34.0 pg   MCHC 32.9 30.0 - 36.0 g/dL   RDW 38.1 82.9 - 93.7 %   Platelets 258 150 - 400 K/uL   nRBC 0.0 0.0 - 0.2 %    Comment: Performed at [facility], [address]  [place], [place] [address]  Urinalysis, Routine w reflex microscopic     Status: Abnormal   Collection Time: 08/09/18 12:42 AM  Result Value Ref Range   Color, Urine COLORLESS (A) YELLOW   APPearance CLEAR CLEAR   Specific Gravity, Urine 1.020 1.005 - 1.030   pH 5.0 5.0 - 8.0   Glucose, UA >=500 (A) NEGATIVE mg/dL   Hgb urine dipstick NEGATIVE NEGATIVE   Bilirubin Urine NEGATIVE NEGATIVE   Ketones, ur 80 (A) NEGATIVE mg/dL   Protein, ur NEGATIVE NEGATIVE mg/dL   Nitrite NEGATIVE NEGATIVE   Leukocytes, UA NEGATIVE NEGATIVE   RBC / HPF 0-5 0 - 5 RBC/hpf   WBC, UA 0-5 0 - 5 WBC/hpf   Bacteria, UA NONE SEEN NONE SEEN    Comment: Performed at [facility], [address] [place], [place] [address]  CBG monitoring, ED     Status: Abnormal   Collection Time: 08/09/18 12:47 AM  Result Value Ref Range   Glucose-Capillary >600 (HH) 70 - 99 mg/dL   Comment 1 Notify RN   CBG monitoring, ED     Status: Abnormal   Collection Time: 08/09/18  2:14 AM  Result Value Ref Range   Glucose-Capillary >600 (HH) 70 - 99 mg/dL  Magnesium     Status: None   Collection Time: 08/09/18  2:28 AM  Result Value Ref Range   Magnesium 2.3 1.7 - 2.4 mg/dL    Comment: Performed at [facility], [address], [place], [place] [address]  Phosphorus     Status: None   Collection Time: 08/09/18  2:28 AM  Result Value Ref Range   Phosphorus 4.2 2.5 - 4.6 mg/dL    Comment: Performed at [facility], [address], [place], [place] [address]  Blood gas, venous     Status: Abnormal   Collection Time: 08/09/18  2:38 AM  Result Value Ref Range   FIO2 21.00    Delivery systems ROOM AIR    pH, Ven 7.320 7.250 -  7.430   pCO2, Ven 32.5 (L) 44.0 - 60.0 mmHg   pO2, Ven 78.2 (H) 32.0 - 45.0 mmHg   Bicarbonate 16.3 (L) 20.0 - 28.0 mmol/L   Acid-base deficit 8.3 (H) 0.0 - 2.0 mmol/L   O2 Saturation 94.3 %   Patient temperature 98.6    Collection site VEIN    Drawn by COLLECTED BY NURSE    Sample type VENOUS     Comment: Performed at [facility], [address] [place], [place] [address]  CBG monitoring, ED     Status: Abnormal   Collection Time: 08/09/18  3:27 AM  Result Value Ref Range   Glucose-Capillary >600 (HH) 70 - 99 mg/dL   No results found. None  Pending Labs Unresulted Labs (From admission, onward)    Start     Ordered   08/16/18 0500  Creatinine, serum  (enoxaparin (LOVENOX)    CrCl >/= 30 ml/min)  Weekly,   R    Comments:  while on enoxaparin therapy    08/09/18 0322   08/09/18 0323  Urine rapid drug screen (hosp performed)not at [facility]  Once,   R     08/09/18 0322   08/09/18 0323  Urinalysis, Complete w Microscopic  Once,   R     08/09/18 0322   08/09/18 0322  HIV antibody (Routine Testing)  Once,   R     08/09/18 0322   08/09/18 0322  Basic metabolic panel  STAT Now then every 4 hours ,   STAT     08/09/18 0322   08/09/18 0322  CBC  (enoxaparin (LOVENOX)    CrCl >/= 30 ml/min)  Once,   R    Comments:  Baseline for enoxaparin therapy IF NOT ALREADY DRAWN.  Notify MD if PLT < 100 K.    08/09/18 0322          Vitals/Pain Today's Vitals   08/09/18 0019 08/09/18 0040 08/09/18 0315 08/09/18 0342  BP: 126/90  (!) 148/98 (!) 148/98  Pulse: 99  88 (!) 103  Resp: 16   18  Temp: 98.4 F (36.9 C)     TempSrc: Oral     SpO2: 94%  97% 97%  Weight: 88.5 kg     Height: 5\' 10"  (1.778 m)     PainSc:  7       Isolation Precautions No active isolations  Medications Medications  sodium chloride 0.9 % bolus 1,000 mL (1,000 mLs Intravenous New Bag/Given 08/09/18 0304)  albuterol (PROVENTIL) (2.5 MG/3ML) 0.083% nebulizer solution 3 mL (has no administration  in time range)  0.9 %  sodium chloride infusion ( Intravenous New Bag/Given 08/09/18 0358)  dextrose 5 %-0.45 % sodium chloride infusion ( Intravenous Not Given 08/09/18 0335)  insulin regular, human (MYXREDLIN) 100 units/ 100 mL infusion (5.4 Units/hr Intravenous New Bag/Given 08/09/18 0357)  enoxaparin (LOVENOX) injection 40 mg (has no administration in time range)    Mobility walks

## 2018-08-09 NOTE — ED Notes (Signed)
Bed: WTR5 Expected date:  Expected time:  Means of arrival:  Comments: 

## 2018-08-09 NOTE — ED Triage Notes (Signed)
Pt arrives with [facility] complaining of hyperglycemia and nausea. CBG 485- took insulin today. States taht it has been in the 600s.

## 2018-08-09 NOTE — ED Notes (Signed)
Pt has drank 2 large pitchers of water. Tolerating well. On the phone.

## 2018-08-09 NOTE — Progress Notes (Signed)
Inpatient Diabetes Program Recommendations  AACE/ADA: New Consensus Statement on Inpatient Glycemic Control (2015)  Target Ranges:  Prepandial:   less than 140 mg/dL      Peak postprandial:   less than 180 mg/dL (1-2 hours)      Critically ill patients:  140 - 180 mg/dL   Lab Results  Component Value Date   GLUCAP 218 (H) 08/09/2018   HGBA1C 11.5 (H) 08/09/2018    Review of Glycemic Control  Pt pulled leads off this am and said he had to leave. After speaking with him, pt willing to stay. States his chest hurts and "Left side of body feels weird." Has no problems obtaining his insulin. [facility] it from the [facility]. On parole at present. Lives in apt with roommate. Takes boxing classes to help with anger management.  Inpatient Diabetes Program Recommendations:     Lunch meal was first food pt has eaten since Friday, per pt.  Pt states he took his insulin, but blood sugars continued to rise.   Will likely need insulin titration up to Lantus 30 units bid and at least 8 units tidwc for meal coverage.   New orders: Lantus 22 units bid, Novolog 8 units tidwc and 0-15 units tidwc and hs. May need sensitive scale since Type 1.   At length discussions with MD and RN.  Will continue to follow.   Thank you. Ailene Ards, RD, LDN, CDE Inpatient Diabetes Coordinator [phone]

## 2018-08-09 NOTE — H&P (Signed)
History and Physical    [name] [name]. ZOX:[id number] DOB: 04-06-2001 DOA: 08/09/2018  PCP: Lavinia Sharps, NP  Patient coming from: Home.  Chief Complaint: Elevated blood sugar.  HPI: [name] [name]. is a 18 y.o. male with history of diabetes mellitus type 1 on Lantus insulin with NovoLog sliding scale presents to the ER because of elevated blood sugar.  Patient states he has not eaten the whole day yesterday and since he took his Lantus he was worried that he may be getting hypoglycemic and said he found his blood sugar was markedly elevated.  Since yesterday morning he also has been having off-and-on some chest pressure retrosternal lasting for few minutes each time.  No associated shortness of breath.  Denies any nausea vomiting or diarrhea.  Patient states he has been compliant with her medications.  ED Course: In the ER patient blood sugar was around 959 with bicarb of 15 anion gap of 21 sodium of 120.  Urine shows ketones.  Patient had fluid bolus started with IV insulin infusion for DKA.  Chest x-ray EKG pending.  Patient admitted for diabetic ketoacidosis precipitating cause not clear.  Review of Systems: As per HPI, rest all negative.   Past Medical History:  Diagnosis Date  . ADD (attention deficit disorder)   . Allergy   . Anxiety   . Asthma   . Depression   . Epileptic seizure (HCC)    "both petit and grand mal; last sz ~ 2 wk ago" (06/17/2013)  . Headache(784.0)    "2-3 times/wk usually" (06/17/2013)  . Heart murmur    "heard a slight one earlier today" (06/17/2013)  . Homeless   . Migraine    "maybe once/month; it's severe" (06/17/2013)  . ODD (oppositional defiant disorder)   . Sickle cell trait (HCC)   . Type I diabetes mellitus (HCC)    "that's what they're thinking now" (06/17/2013)  . Vision abnormalities    "takes him longer to focus cause; from his sz" (06/17/2013)    Past Surgical History:  Procedure Laterality Date  . CIRCUMCISION  2000    . FINGER SURGERY Left 2001   "crushed pinky; had to repair it" (06/17/2013)     reports that he is a non-smoker but has been exposed to tobacco smoke. He has never used smokeless tobacco. He reports that he does not drink alcohol or use drugs.  Allergies  Allergen Reactions  . Bee Venom Anaphylaxis  . Fish Allergy Anaphylaxis  . Shellfish Allergy Anaphylaxis    Family History  Problem Relation Age of Onset  . Asthma Mother   . COPD Mother   . Other Mother        possible autoimmune, unclear  . Diabetes Maternal Grandmother   . Heart disease Maternal Grandmother   . Hypertension Maternal Grandmother   . Mental illness Maternal Grandmother   . Heart disease Maternal Grandfather   . Hyperlipidemia Paternal Grandmother   . Hyperlipidemia Paternal Grandfather   . Migraines Sister        Hemiplegic Migraines     Prior to Admission medications   Medication Sig Start Date End Date Taking? Authorizing Provider  albuterol (PROVENTIL HFA;VENTOLIN HFA) 108 (90 Base) MCG/ACT inhaler Inhale 1 puff into the lungs every 4 (four) hours as needed for wheezing or shortness of breath.   Yes [provider]  glucose blood (ACCU-CHEK GUIDE) test strip Check glucose 6x daily: For blood sugar checks 03/22/18  Yes Long, Arlyss Repress,  MD  hydrOXYzine (ATARAX/VISTARIL) 50 MG tablet Take 50 mg by mouth daily as needed for anxiety.   Yes [provider]  insulin aspart (NOVOLOG FLEXPEN) 100 UNIT/ML FlexPen Inject 0-15 Units into the skin 3 (three) times daily with meals. (Sliding scale): For diabetes management 03/09/18  Yes Armandina StammerNwoko, Agnes I, NP  insulin glargine (LANTUS) 100 UNIT/ML injection Inject 0.3 mLs (30 Units total) into the skin 2 (two) times daily. 07/12/18  Yes Ward, Layla MawKristen N, DO  traZODone (DESYREL) 50 MG tablet Take 100 mg by mouth at bedtime as needed for sleep.    Yes [provider]  insulin lispro (HUMALOG) 100 UNIT/ML injection Sliding scale insulin with  meals Patient not taking: Reported on 07/22/2018 07/12/18   Ward, Layla MawKristen N, DO    Physical Exam: Vitals:   08/09/18 0019  BP: 126/90  Pulse: 99  Resp: 16  Temp: 98.4 F (36.9 C)  TempSrc: Oral  SpO2: 94%  Weight: 88.5 kg  Height: 5\' 10"  (1.778 m)      Constitutional: Moderately built and nourished. Vitals:   08/09/18 0019  BP: 126/90  Pulse: 99  Resp: 16  Temp: 98.4 F (36.9 C)  TempSrc: Oral  SpO2: 94%  Weight: 88.5 kg  Height: 5\' 10"  (1.778 m)   Eyes: Anicteric no pallor. ENMT: No discharge from the ears eyes nose and mouth. Neck: No neck rigidity.  No mass felt.  No JVD appreciated. Respiratory: No rhonchi or crepitations. Cardiovascular: S1-S2 heard. Abdomen: Soft nontender bowel sounds present. Musculoskeletal: No edema.  No joint effusion. Skin: No rash. Neurologic: Alert awake oriented to time place and person.  Moves all extremities. Psychiatric: Appears normal per normal affect.   Labs on Admission: I have personally reviewed following labs and imaging studies  CBC: Recent Labs  Lab 08/09/18 0042  WBC 6.1  HGB 16.3  HCT 49.6  MCV 82.0  PLT 258   Basic Metabolic Panel: Recent Labs  Lab 08/09/18 0042  NA 120*  K 5.0  CL 84*  CO2 15*  GLUCOSE 959*  BUN 22*  CREATININE 1.21  CALCIUM 8.4*   GFR: Estimated Creatinine Clearance: 110 mL/min (by C-G formula based on SCr of 1.21 mg/dL). Liver Function Tests: No results for input(s): AST, ALT, ALKPHOS, BILITOT, PROT, ALBUMIN in the last 168 hours. No results for input(s): LIPASE, AMYLASE in the last 168 hours. No results for input(s): AMMONIA in the last 168 hours. Coagulation Profile: No results for input(s): INR, PROTIME in the last 168 hours. Cardiac Enzymes: No results for input(s): CKTOTAL, CKMB, CKMBINDEX, TROPONINI in the last 168 hours. BNP (last 3 results) No results for input(s): PROBNP in the last 8760 hours. HbA1C: No results for input(s): HGBA1C in the last 72  hours. CBG: Recent Labs  Lab 08/09/18 0047 08/09/18 0214  GLUCAP >600* >600*   Lipid Profile: No results for input(s): CHOL, HDL, LDLCALC, TRIG, CHOLHDL, LDLDIRECT in the last 72 hours. Thyroid Function Tests: No results for input(s): TSH, T4TOTAL, FREET4, T3FREE, THYROIDAB in the last 72 hours. Anemia Panel: No results for input(s): VITAMINB12, FOLATE, FERRITIN, TIBC, IRON, RETICCTPCT in the last 72 hours. Urine analysis:    Component Value Date/Time   COLORURINE COLORLESS (A) 08/09/2018 0042   APPEARANCEUR CLEAR 08/09/2018 0042   LABSPEC 1.020 08/09/2018 0042   PHURINE 5.0 08/09/2018 0042   GLUCOSEU >=500 (A) 08/09/2018 0042   HGBUR NEGATIVE 08/09/2018 0042   BILIRUBINUR NEGATIVE 08/09/2018 0042   KETONESUR 80 (A) 08/09/2018 0042   PROTEINUR  NEGATIVE 08/09/2018 0042   UROBILINOGEN 0.2 03/13/2015 0123   NITRITE NEGATIVE 08/09/2018 0042   LEUKOCYTESUR NEGATIVE 08/09/2018 0042   Sepsis Labs: @LABRCNTIP (procalcitonin:4,lacticidven:4) )No results found for this or any previous visit (from the past 240 hour(s)).   Radiological Exams on Admission: No results found.   Assessment/Plan Principal Problem:   DKA (diabetic ketoacidoses) (HCC) Active Problems:   MDD (major depressive disorder), recurrent episode, moderate (HCC)   DKA, type 1 (HCC)    1. Diabetic ketoacidosis type I precipitating cause not clear.  Less hemoglobin A1c in October was around 10.8.  Not sure of about patient's compliance.  Patient states he has been taking his medication as advised.  Patient is on insulin infusion closely follow metabolic panel once anion gap gets corrected change to long-acting insulin.  IV fluids. 2. Chest pain -patient states he has been having chest pain off and on since yesterday morning.  EKG chest x-ray and troponin are pending at this time.  Patient is presently chest pain-free. 3. History of depression and anxiety presently on trazodone.  Denies any suicidal  ideation. 4. History of drug abuse per the chart.  Drug screen is pending.  Patient denies taking any cocaine. 5. Hyponatremia likely from dehydration and hyperglycemia which I think will improve with correction of glucose and hydration.   DVT prophylaxis: Lovenox. Code Status: Full code. Family Communication: Discussed with patient. Disposition Plan: Home. Consults called: None. Admission status: Inpatient.   Eduard ClosArshad N Latina Frank MD [facility] Pager [phone].  If 7PM-7AM, please contact night-coverage [email or web address] Password [email or web address]  08/09/2018, 3:23 AM

## 2018-08-09 NOTE — Progress Notes (Addendum)
Inpatient Diabetes Program Recommendations  AACE/ADA: New Consensus Statement on Inpatient Glycemic Control (2015)  Target Ranges:  Prepandial:   less than 140 mg/dL      Peak postprandial:   less than 180 mg/dL (1-2 hours)      Critically ill patients:  140 - 180 mg/dL   Lab Results  Component Value Date   GLUCAP 218 (H) 08/09/2018   HGBA1C 10.8 (H) 04/05/2018    Review of Glycemic Control  Diabetes history: DM1 Outpatient Diabetes medications: Lantus 30 units bid, Novolog 0-15 units tidwc Current orders for Inpatient glycemic control: IV insulin transitioning to Lantus 15 units QD, Novolog 0-15 units tidwc and hs  Needs updated HgbA1C. Last one 10.8% on 04/05/2018.  Tried to speak with pt about his insulin at home and could not get pt to talk. He grimaced and pointed to abdomen and chest when this Coordinator asked if he was in pain.   Will likely need more basal insulin.   Inpatient Diabetes Program Recommendations:     Increase Lantus to 18 units bid. Add Novolog 6 units tidwc if pt eats > 50% meal  Will continue attempts to speak with pt regarding his diabetes and glucose control at home.   Thank you. Ailene Ards, RD, LDN, CDE Inpatient Diabetes Coordinator [phone]

## 2018-08-09 NOTE — ED Notes (Signed)
Pt reporting that he wants to leave AMA. Dr.Kakrakandy notified. Pt being encouraged to stay for admission at this time. Will notify Dr.Kakrakandy of patient's decision.

## 2018-08-09 NOTE — Progress Notes (Signed)
At 1337 pt still yelling out in pain grabbing chest 8 out of 10 on pain scale and grimacing so 3 SL of Nitro given per protocol with some relief of 7 out of 10 and now has HA. MD aware and ordered Morphine which was given at 14:27 with no relief of CP but HA now resolved, pt walked unit 2 laps with staff and no CP sharpness noted, had the CP again at 17:31 so MD notified and since all trops negative, Toradol IV ordered for pt. Will continue to monitor pt and CP

## 2018-08-09 NOTE — ED Provider Notes (Signed)
[facility]-EMERGENCY DEPT Provider Note   CSN: [id number] Arrival date & time: 08/09/18  0005     History   Chief Complaint Chief Complaint  Patient presents with  . Hyperglycemia    HPI [name] [name]. is a 18 y.o. male.  The history is provided by the patient.  He has history of diabetes, sickle cell trait, seizure disorder, attention deficit disorder, asthma and comes in because his glucose is high.  He states that his glucose has been reading high for the last 2 days.  He denies fever, chills, sweats.  He denies nausea or vomiting.  He states he has been taking his insulin exactly as it has been prescribed and he has been eating normally.  He expresses concern that his probation officer has had him put in jail for being in the hospital to take care of his diabetes.  Past Medical History:  Diagnosis Date  . ADD (attention deficit disorder)   . Allergy   . Anxiety   . Asthma   . Depression   . Epileptic seizure (HCC)    "both petit and grand mal; last sz ~ 2 wk ago" (06/17/2013)  . Headache(784.0)    "2-3 times/wk usually" (06/17/2013)  . Heart murmur    "heard a slight one earlier today" (06/17/2013)  . Homeless   . Migraine    "maybe once/month; it's severe" (06/17/2013)  . ODD (oppositional defiant disorder)   . Sickle cell trait (HCC)   . Type I diabetes mellitus (HCC)    "that's what they're thinking now" (06/17/2013)  . Vision abnormalities    "takes him longer to focus cause; from his sz" (06/17/2013)    Patient Active Problem List   Diagnosis Date Noted  . Borderline personality disorder (HCC) 02/26/2018  . 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 DKA, type 1 (HCC) 07/16/2017  . Nausea & vomiting 07/16/2017  . Uncontrolled type 1 diabetes circulatory disorder erectile dysfunction (HCC)   . DKA  (diabetic ketoacidoses) (HCC) 12/18/2016  . History of seizures 12/01/2016  . History of migraine 12/01/2016  . Disordered eating 03/08/2015  . Ketonuria   . Adjustment reaction to medical therapy   . Non compliance w medication regimen   . 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 Dehydration 08/22/2014  . Hyperglycemia due to type 1 diabetes mellitus (HCC) 08/21/2014  . Type 1 diabetes mellitus with hyperglycemia (HCC)   . Noncompliance   . Generalized abdominal pain   . Glycosuria   . Depression 07/12/2014  . Involuntary movements 06/13/2014  . Bilateral leg pain 06/13/2014  . 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 Suicidal ideation 03/31/2014  . ODD (oppositional defiant disorder) 03/03/2014  . MDD (major depressive disorder), recurrent episode, moderate (HCC) 02/16/2014  . Attention deficit hyperactivity disorder (ADHD), combined type, moderate 02/16/2014  . Microcytic anemia 02/15/2014  . Hyperglycemia 02/14/2014  . Goiter 02/04/2014  . 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 Acquired acanthosis nigricans 02/04/2014  . Obesity, morbid (HCC) 02/04/2014  . Insulin resistance 02/04/2014  . Hyperinsulinemia 02/04/2014  . Hypoglycemia associated with diabetes (HCC) 02/02/2014  . Maladaptive health behaviors affecting medical condition 02/02/2014  . Hypoglycemia 02/02/2014  . Type 1 diabetes mellitus in patient 78 to 20 years of age with hemoglobin A1c goal  of less than 7.5% (HCC) 01/26/2014  . 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  . 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 Migraine without aura 12/13/2013  . Body mass index, pediatric, greater than or equal to 95th percentile for age 93/16/2015  . Asthma 12/13/2013  . Hypoglycemia unawareness in type 1 diabetes mellitus (HCC) 08/08/2013  .  Seizure disorder (HCC) 07/06/2013  . Short-term memory loss 07/06/2013  . Sickle cell trait (HCC) 07/06/2013  . Diabetes (HCC) 06/17/2013  . Diabetes mellitus, new onset (HCC) 06/17/2013    Past Surgical History:  Procedure Laterality Date  . CIRCUMCISION  2000  . FINGER SURGERY Left 2001   "crushed pinky; had to repair it" (06/17/2013)        Home Medications    Prior to Admission medications   Medication Sig Start Date End Date Taking? Authorizing Provider  albuterol (PROVENTIL HFA;VENTOLIN HFA) 108 (90 Base) MCG/ACT inhaler Inhale 1 puff into the lungs every 4 (four) hours as needed for wheezing or shortness of breath.   Yes [provider]  glucose blood (ACCU-CHEK GUIDE) test strip Check glucose 6x daily: For blood sugar checks 03/22/18  Yes Long, Arlyss Repress, MD  hydrOXYzine (ATARAX/VISTARIL) 50 MG tablet Take 50 mg by mouth daily as needed for anxiety.   Yes [provider]  insulin aspart (NOVOLOG FLEXPEN) 100 UNIT/ML FlexPen Inject 0-15 Units into the skin 3 (three) times daily with meals. (Sliding scale): For diabetes management 03/09/18  Yes Armandina Stammer I, NP  insulin glargine (LANTUS) 100 UNIT/ML injection Inject 0.3 mLs (30 Units total) into the skin 2 (two) times daily. 07/12/18  Yes Ward, Layla Maw, DO  traZODone (DESYREL) 50 MG tablet Take 100 mg by mouth at bedtime as needed for sleep.    Yes [provider]  insulin lispro (HUMALOG) 100 UNIT/ML injection Sliding scale insulin with meals Patient not taking: Reported on 07/22/2018 07/12/18   Ward, Layla Maw, DO    Family History Family History  Problem Relation Age of Onset  . Asthma Mother   . COPD Mother   . Other Mother        possible autoimmune, unclear  . Diabetes Maternal Grandmother   . Heart disease Maternal Grandmother   . Hypertension Maternal Grandmother   . Mental illness Maternal Grandmother   . Heart disease Maternal Grandfather   . Hyperlipidemia Paternal Grandmother   .  Hyperlipidemia Paternal Grandfather   . Migraines Sister        Hemiplegic Migraines     Social History Social History   Tobacco Use  . Smoking status: Passive Smoke Exposure - Never Smoker  . Smokeless tobacco: Never Used  . Tobacco comment: Mom and dad smoke outside   Substance Use Topics  . Alcohol use: No    Alcohol/week: 0.0 standard drinks  . Drug use: No     Allergies   Bee venom; Fish allergy; and Shellfish allergy   Review of Systems Review of Systems  All other systems reviewed and are negative.    Physical Exam Updated Vital Signs BP 126/90 (BP Location: Left Arm)   Pulse 99   Temp 98.4 F (36.9 C) (Oral)   Resp 16   Ht 5\' 10"  (1.778 m)   Wt 88.5 kg   SpO2 94%   BMI 27.98 kg/m   Physical Exam Vitals signs and nursing note reviewed.    18 year old male, resting comfortably and in no acute distress. Vital signs are normal.  Oxygen saturation is 94%, which is normal. Head is normocephalic and atraumatic. PERRLA, EOMI. Oropharynx is clear. Neck is nontender and supple without adenopathy or JVD. Back is nontender and there is no CVA tenderness. Lungs are clear without rales, wheezes, or rhonchi. Chest is nontender. Heart has regular rate and rhythm without murmur. Abdomen is soft, flat, nontender without masses or hepatosplenomegaly and peristalsis is normoactive. Extremities have no cyanosis or edema, full range of motion is present. Skin is warm and dry without rash. Neurologic: Mental status is normal, cranial nerves are intact, there are no motor or sensory deficits.  ED Treatments / Results  Labs (all labs ordered are listed, but only abnormal results are displayed) Labs Reviewed  BASIC METABOLIC PANEL - Abnormal; Notable for the following components:      Result Value   Sodium 120 (*)    Chloride 84 (*)    CO2 15 (*)    Glucose, Bld 959 (*)    BUN 22 (*)    Calcium 8.4 (*)    Anion gap 21 (*)    All other components within normal  limits  CBC - Abnormal; Notable for the following components:   RBC 6.05 (*)    All other components within normal limits  URINALYSIS, ROUTINE W REFLEX MICROSCOPIC - Abnormal; Notable for the following components:   Color, Urine COLORLESS (*)    Glucose, UA >=500 (*)    Ketones, ur 80 (*)    All other components within normal limits  CBG MONITORING, ED - Abnormal; Notable for the following components:   Glucose-Capillary >600 (*)    All other components within normal limits  CBG MONITORING, ED - Abnormal; Notable for the following components:   Glucose-Capillary >600 (*)    All other components within normal limits  BLOOD GAS, VENOUS  MAGNESIUM  PHOSPHORUS   Procedures Procedures  CRITICAL CARE Performed by: Dione Booze Total critical care time: 50 minutes Critical care time was exclusive of separately billable procedures and treating other patients. Critical care was necessary to treat or prevent imminent or life-threatening deterioration. Critical care was time spent personally by me on the following activities: development of treatment plan with patient and/or surrogate as well as nursing, discussions with consultants, evaluation of patient's response to treatment, examination of patient, obtaining history from patient or surrogate, ordering and performing treatments and interventions, ordering and review of laboratory studies, ordering and review of radiographic studies, pulse oximetry and re-evaluation of patient's condition.  Medications Ordered in ED Medications - No data to display   Initial Impression / Assessment and Plan / ED Course  I have reviewed the triage vital signs and the nursing notes.  Pertinent lab results that were available during my care of the patient were reviewed by me and considered in my medical decision making (see chart for details).  Ketoacidosis.  Labs show glucose of 959, CO2 of 15 with anion gap of 21.  He is started on glucose stabilizer and  is given IV fluids.  Case is discussed with Dr. Toniann Fail of [facility], who agrees to admit the patient.  Final Clinical Impressions(s) / ED Diagnoses   Final diagnoses:  Diabetic ketoacidosis without coma associated with type 1 diabetes mellitus [facility])    ED Discharge Orders    None       Dione Booze, MD 08/09/18 2241

## 2018-08-09 NOTE — Progress Notes (Addendum)
Dr. Nelson Chimes at bedside, pt not verbalizing any current CP and EKG now on chart.  Cardiology to be consulted and trending trops. Will continue to monitor

## 2018-08-09 NOTE — Care Management Note (Signed)
Case Management Note  Patient Details  Name: [name] [name]. MRN: [id number] Date of Birth: 10/07/2000  Subjective/Objective:                  Discharge Readiness Return to top of Diabetes RRG - ISC  Discharge readiness is indicated by patient meeting Recovery Milestones, including ALL of the following: ? Hemodynamic stability  YES ? No precipitating cause identified, or cause manageable in outpatient setting YES NOT EATING AFTER TAKING INSULIN ? Acidosis absent CO2-15 ? Mental status at baseline YES ? Blood glucose under acceptable control NO GLUCOSE THIS AM =959 ? Electrolyte abnormalities absent or acceptable for next level of care NA=120 ? Renal function at baseline or acceptable for next level of care  ? Anion Gap-21 ? Dehydration absent yes ? Outpatient diabetic medication regimen established no ? Ambulatory yes ? Oral hydration, medications, and diet ? Npo, IV INSULIN, D51/2NS AT 125CC/HRS,IV NS AT 150CC/HRS ? LEVEL OF CARE SDU   Action/Plan: Will follow for progression of care and clinical status. Will follow for case management needs none present at this time.  Expected Discharge Date:  (unknown)               Expected Discharge Plan:     In-House Referral:     Discharge planning Services     Post Acute Care Choice:    Choice offered to:     DME Arranged:    DME Agency:     HH Arranged:    HH Agency:     Status of Service:     If discussed at [employer], dates discussed:    Additional Comments:  Golda Acre, RN 08/09/2018, 11:08 AM

## 2018-08-09 NOTE — Progress Notes (Signed)
PROGRESS NOTE    [name] [name].  ZDG:[id number] DOB: 05-May-2001 DOA: 08/09/2018 PCP: Lavinia Sharps, NP   Brief Narrative:  18 year old with history of diabetes mellitus type 1, noncompliance came to the ER for evaluation of elevated blood glucose.  He was found to be in diabetic ketoacidosis with anion gap of 21 and sodium of 120.  He was started on routine DKA protocol.  EKG was abnormal showing STEMI but not much different from previous EKGs.  Case was discussed with cardiology who recommended trending her troponin.   Assessment & Plan:   Principal Problem:   DKA (diabetic ketoacidoses) (HCC) Active Problems:   MDD (major depressive disorder), recurrent episode, moderate (HCC)   DKA, type 1 (HCC)  Diabetic ketoacidosis, resolved Uncontrolled type 1 diabetes -Did well on DKA protocol will transition to subcutaneous insulin.  Lantus 15 units twice daily along with insulin sliding scale will slowly uptitrate. -Home regimen Lantus 30 units twice daily. - Previous hemoglobin A1c 10.8, repeat hemoglobin A1c - Diabetic coordinator consulted -Closely monitor labs -Antiemetics as needed.  IV fluids as needed.  Monitor urine output -Check UDS  Abnormal EKG showing STEMI - Appears to be just little evolution from previous abnormal EKG.  Case discussed with cardiology recommending trending troponin.  2 sets of cardiac enzymes negative.  We will continue to monitor this.  Hyponatremia -Secondary to pseudohyponatremia and dehydration.  Getting IV fluids.  Will closely monitor sodium levels.  History of drug use -Check UDS  History of depression and anxiety -Resume home meds.  Currently he is not suicidal or homicidal.  But unable to willing to speak with as much    DVT prophylaxis: Lovenox Code Status: Full code Family Communication: None at bedside Disposition Plan: Maintain inpatient stay  Consultants:   Curbside cardiology  Procedures:   None  Antimicrobials:    None   Subjective: Patient is laying in the bed and wakes up to his name but does not want to carry on any sort of conversation to provide any meaningful history.  Telemetry showed abnormal reading therefore EKG done which showed ST elevation MI.  Case was discussed with cardiology who recommended trending of cardiac enzymes for now as this only appears to be evolution of previously abnormal EKG.  Otherwise no further work-up at this time.  Review of Systems Otherwise negative except as per HPI, including: Limited as patient is now willing to speak with [place].  Denies any complaints.  Objective: Vitals:   08/09/18 0600 08/09/18 0700 08/09/18 0800 08/09/18 0900  BP:  (!) 125/49 (!) 120/53 (!) 142/61  Pulse: 79 77 81 80  Resp: 14 14 16  (!) 23  Temp:   97.7 F (36.5 C)   TempSrc:   Oral   SpO2: 93% 96% 96% 98%  Weight:      Height:        Intake/Output Summary (Last 24 hours) at 08/09/2018 1121 Last data filed at 08/09/2018 1050 Gross per 24 hour  Intake 1968.16 ml  Output 1500 ml  Net 468.16 ml   Filed Weights   08/09/18 0019  Weight: 88.5 kg    Examination:  General exam: Appears calm and comfortable  Respiratory system: Clear to auscultation. Respiratory effort normal. Cardiovascular system: S1 & S2 heard, RRR. No JVD, murmurs, rubs, gallops or clicks. No pedal edema. Gastrointestinal system: Abdomen is nondistended, soft and nontender. No organomegaly or masses felt. Normal bowel sounds heard. Central nervous system: Alert and oriented.  Grossly no focal  neurological deficits. Extremities: Symmetric 5 x 5 power. Skin: No rashes, lesions or ulcers Psychiatry: Poor judgment    Data Reviewed:   CBC: Recent Labs  Lab 08/09/18 0042 08/09/18 0545  WBC 6.1 8.4  HGB 16.3 15.9  HCT 49.6 47.1  MCV 82.0 81.8  PLT 258 260   Basic Metabolic Panel: Recent Labs  Lab 08/09/18 0042 08/09/18 0228 08/09/18 0719  NA 120*  --  133*  K 5.0  --  3.4*  CL 84*  --  100   CO2 15*  --  19*  GLUCOSE 959*  --  241*  BUN 22*  --  18  CREATININE 1.21  --  0.76  CALCIUM 8.4*  --  8.5*  MG  --  2.3  --   PHOS  --  4.2  --    GFR: Estimated Creatinine Clearance: 166.4 mL/min (by C-G formula based on SCr of 0.76 mg/dL). Liver Function Tests: No results for input(s): AST, ALT, ALKPHOS, BILITOT, PROT, ALBUMIN in the last 168 hours. No results for input(s): LIPASE, AMYLASE in the last 168 hours. No results for input(s): AMMONIA in the last 168 hours. Coagulation Profile: No results for input(s): INR, PROTIME in the last 168 hours. Cardiac Enzymes: Recent Labs  Lab 08/09/18 0545 08/09/18 0719  TROPONINI <0.03 <0.03   BNP (last 3 results) No results for input(s): PROBNP in the last 8760 hours. HbA1C: No results for input(s): HGBA1C in the last 72 hours. CBG: Recent Labs  Lab 08/09/18 0327 08/09/18 0514 08/09/18 0629 08/09/18 0743 08/09/18 0858  GLUCAP >600* 352* 272* 238* 218*   Lipid Profile: No results for input(s): CHOL, HDL, LDLCALC, TRIG, CHOLHDL, LDLDIRECT in the last 72 hours. Thyroid Function Tests: No results for input(s): TSH, T4TOTAL, FREET4, T3FREE, THYROIDAB in the last 72 hours. Anemia Panel: No results for input(s): VITAMINB12, FOLATE, FERRITIN, TIBC, IRON, RETICCTPCT in the last 72 hours. Sepsis Labs: No results for input(s): PROCALCITON, LATICACIDVEN in the last 168 hours.  No results found for this or any previous visit (from the past 240 hour(s)).       Radiology Studies: Dg Chest Port 1 View  Result Date: 08/09/2018 CLINICAL DATA:  18 y/o  M; chest pain. EXAM: PORTABLE CHEST 1 VIEW COMPARISON:  03/22/2018 chest radiograph. FINDINGS: Stable heart size and mediastinal contours are within normal limits. Both lungs are clear. The visualized skeletal structures are unremarkable. IMPRESSION: No active disease. Electronically Signed   By: Mitzi Hansen M.D.   On: 08/09/2018 06:14        Scheduled Meds: .  enoxaparin (LOVENOX) injection  40 mg Subcutaneous Daily  . insulin aspart  0-15 Units Subcutaneous TID WC  . insulin aspart  0-5 Units Subcutaneous QHS  . insulin glargine  18 Units Subcutaneous BID  . traZODone  100 mg Oral QHS   Continuous Infusions: . sodium chloride Stopped (08/09/18 0747)  . dextrose 5 % and 0.45% NaCl 125 mL/hr at 08/09/18 0748  . insulin 1.5 Units/hr (08/09/18 1050)     LOS: 0 days   Time spent= 45 mins    Neilan Rizzo Joline Maxcy, MD [facility]  If 7PM-7AM, please contact night-coverage [email or web address] 08/09/2018, 11:21 AM

## 2018-08-09 NOTE — Progress Notes (Signed)
At 12:17 pt now talking and verbalizing that is having CP and numbness down his left arm, Ultram given, MD aware. Trops so far have been negative, will continue to monitor

## 2018-08-10 NOTE — Discharge Summary (Signed)
Physician Discharge Summary  [name] [name]. [id number] DOB: Oct 30, 2000 DOA: 08/09/2018  PCP: Lavinia Sharps, NP  Admit date: 08/09/2018 Discharge date: 08/10/2018  Admitted From: Home Disposition: Left AGAINST MEDICAL ADVICE  Recommendations for Outpatient Follow-up: Patient left overnight AGAINST MEDICAL ADVICE.  Brief/Interim Summary: 18 year old with history of diabetes mellitus type 1, noncompliance came to the ER for evaluation of elevated blood glucose.  He was found to be in diabetic ketoacidosis with anion gap of 21 and sodium of 120.  He was started on routine DKA protocol.  EKG was abnormal showing STEMI but not much different from previous EKGs.  Case was discussed with cardiology who recommended trending her troponin.  Cardiac enzymes remain negative X4.  Patient was seen by diabetic coordinator.  After lots of convincing patient agreed to stay but at some point overnight he ended up leaving AGAINST MEDICAL ADVICE.  I wasn't on call therefore I am not aware when the patient left.   Discharge Diagnoses:  Principal Problem:   DKA (diabetic ketoacidoses) (HCC) Active Problems:   MDD (major depressive disorder), recurrent episode, moderate (HCC)   DKA, type 1 (HCC)  Diabetic ketoacidosis, resolved Uncontrolled diabetes mellitus type 1 Abnormal EKG, questionable STEMI Hyponatremia, resolved History of drug use -During the hospitalization patient was treated for diabetic ketoacidosis.  His anion gap closed.  His blood glucose were being controlled by subcutaneous insulin.  Diabetic coordinator was consulted. -Case was discussed with cardiology, EKG did not seem to be concerning for any active ACS.  Cardiac enzymes remain negative X4. -With IV fluids his abnormal electrolytes improved.  At some point in the night patient ended up leaving AGAINST MEDICAL ADVICE.  I was not on call therefore I was not aware of this.  Discharge Instructions   Allergies as of 08/09/2018       Reactions   Bee Venom Anaphylaxis   Fish Allergy Anaphylaxis   Shellfish Allergy Anaphylaxis      Medication List    ASK your doctor about these medications   albuterol 108 (90 Base) MCG/ACT inhaler Commonly known as:  PROVENTIL HFA;VENTOLIN HFA Inhale 1 puff into the lungs every 4 (four) hours as needed for wheezing or shortness of breath.   glucose blood test strip Commonly known as:  ACCU-CHEK GUIDE Check glucose 6x daily: For blood sugar checks   hydrOXYzine 50 MG tablet Commonly known as:  ATARAX/VISTARIL Take 50 mg by mouth daily as needed for anxiety.   insulin aspart 100 UNIT/ML FlexPen Commonly known as:  NOVOLOG FLEXPEN Inject 0-15 Units into the skin 3 (three) times daily with meals. (Sliding scale): For diabetes management   insulin glargine 100 UNIT/ML injection Commonly known as:  LANTUS Inject 0.3 mLs (30 Units total) into the skin 2 (two) times daily.   insulin lispro 100 UNIT/ML injection Commonly known as:  HUMALOG Sliding scale insulin with meals   traZODone 50 MG tablet Commonly known as:  DESYREL Take 100 mg by mouth at bedtime as needed for sleep.       Allergies  Allergen Reactions  . Bee Venom Anaphylaxis  . Fish Allergy Anaphylaxis  . Shellfish Allergy Anaphylaxis    You were cared for by a hospitalist during your hospital stay. If you have any questions about your discharge medications or the care you received while you were in the hospital after you are discharged, you can call the unit and asked to speak with the hospitalist on call if the hospitalist that took care of  you is not available. Once you are discharged, your primary care physician will handle any further medical issues. Please note that no refills for any discharge medications will be authorized once you are discharged, as it is imperative that you return to your primary care physician (or establish a relationship with a primary care physician if you do not have one) for your  aftercare needs so that they can reassess your need for medications and monitor your lab values.  Consultations:  Diabetic coordinator  Curbside cardiology   Procedures/Studies: Dg Chest Port 1 View  Result Date: 08/09/2018 CLINICAL DATA:  18 y/o  M; chest pain. EXAM: PORTABLE CHEST 1 VIEW COMPARISON:  03/22/2018 chest radiograph. FINDINGS: Stable heart size and mediastinal contours are within normal limits. Both lungs are clear. The visualized skeletal structures are unremarkable. IMPRESSION: No active disease. Electronically Signed   By: Mitzi Hansen M.D.   On: 08/09/2018 06:14      Discharge Exam: Vitals:   08/09/18 1945 08/09/18 2000  BP:    Pulse: (!) 106 92  Resp: (!) 25 20  Temp:    SpO2: 100% 97%   Vitals:   08/09/18 1800 08/09/18 1900 08/09/18 1945 08/09/18 2000  BP:      Pulse: 85 86 (!) 106 92  Resp: 20 17 (!) 25 20  Temp:      TempSrc:      SpO2: 97% 98% 100% 97%  Weight:      Height:       No physical exam performed as patient had left AMA.    The results of significant diagnostics from this hospitalization (including imaging, microbiology, ancillary and laboratory) are listed below for reference.     Microbiology: Recent Results (from the past 240 hour(s))  MRSA PCR Screening     Status: None   Collection Time: 08/09/18  3:04 PM  Result Value Ref Range Status   MRSA by PCR NEGATIVE NEGATIVE Final    Comment:        The GeneXpert MRSA Assay (FDA approved for NASAL specimens only), is one component of a comprehensive MRSA colonization surveillance program. It is not intended to diagnose MRSA infection nor to guide or monitor treatment for MRSA infections. Performed at [facility], [address] [place], [place] [address]      Labs: BNP (last 3 results) No results for input(s): BNP in the last 8760 hours. Basic Metabolic Panel: Recent Labs  Lab 08/09/18 0042 08/09/18 0228 08/09/18 0719 08/09/18 1112  08/09/18 1344  NA 120*  --  133* 139 133*  K 5.0  --  3.4* 3.3* 4.4  CL 84*  --  100 104 100  CO2 15*  --  19* 26 22  GLUCOSE 959*  --  241* 92 371*  BUN 22*  --  18 14 14   CREATININE 1.21  --  0.76 0.69 0.69  CALCIUM 8.4*  --  8.5* 8.4* 8.4*  MG  --  2.3  --   --   --   PHOS  --  4.2  --   --   --    Liver Function Tests: No results for input(s): AST, ALT, ALKPHOS, BILITOT, PROT, ALBUMIN in the last 168 hours. No results for input(s): LIPASE, AMYLASE in the last 168 hours. No results for input(s): AMMONIA in the last 168 hours. CBC: Recent Labs  Lab 08/09/18 0042 08/09/18 0545  WBC 6.1 8.4  HGB 16.3 15.9  HCT 49.6 47.1  MCV 82.0  81.8  PLT 258 260   Cardiac Enzymes: Recent Labs  Lab 08/09/18 0545 08/09/18 0719 08/09/18 1112 08/09/18 1345  TROPONINI <0.03 <0.03 <0.03 <0.03   BNP: Invalid input(s): POCBNP CBG: Recent Labs  Lab 08/09/18 0932 08/09/18 1027 08/09/18 1221 08/09/18 1401 08/09/18 1702  GLUCAP 175* 137* 152* 406* 280*   D-Dimer No results for input(s): DDIMER in the last 72 hours. Hgb A1c Recent Labs    08/09/18 1112  HGBA1C 11.5*   Lipid Profile No results for input(s): CHOL, HDL, LDLCALC, TRIG, CHOLHDL, LDLDIRECT in the last 72 hours. Thyroid function studies No results for input(s): TSH, T4TOTAL, T3FREE, THYROIDAB in the last 72 hours.  Invalid input(s): FREET3 Anemia work up No results for input(s): VITAMINB12, FOLATE, FERRITIN, TIBC, IRON, RETICCTPCT in the last 72 hours. Urinalysis    Component Value Date/Time   COLORURINE STRAW (A) 08/09/2018 0323   APPEARANCEUR CLEAR 08/09/2018 0323   LABSPEC 1.022 08/09/2018 0323   PHURINE 6.0 08/09/2018 0323   GLUCOSEU >=500 (A) 08/09/2018 0323   HGBUR NEGATIVE 08/09/2018 0323   BILIRUBINUR NEGATIVE 08/09/2018 0323   KETONESUR 20 (A) 08/09/2018 0323   PROTEINUR NEGATIVE 08/09/2018 0323   UROBILINOGEN 0.2 03/13/2015 0123   NITRITE NEGATIVE 08/09/2018 0323   LEUKOCYTESUR NEGATIVE  08/09/2018 0323   Sepsis Labs Invalid input(s): PROCALCITONIN,  WBC,  LACTICIDVEN Microbiology Recent Results (from the past 240 hour(s))  MRSA PCR Screening     Status: None   Collection Time: 08/09/18  3:04 PM  Result Value Ref Range Status   MRSA by PCR NEGATIVE NEGATIVE Final    Comment:        The GeneXpert MRSA Assay (FDA approved for NASAL specimens only), is one component of a comprehensive MRSA colonization surveillance program. It is not intended to diagnose MRSA infection nor to guide or monitor treatment for MRSA infections. Performed at [name], [address] [place], [place] [address]      Time coordinating discharge:  I have spent 20 minutes face to face with the patient and on the ward discussing the patients care, assessment, plan and disposition with other care givers. >50% of the time was devoted counseling the patient about the risks and benefits of treatment/Discharge disposition and coordinating care.   SIGNED:   Dimple Nanas, MD  [facility] 08/10/2018, 12:49 PM   If 7PM-7AM, please contact night-coverage [email or web address]

## 2018-08-11 ENCOUNTER — Emergency Department (HOSPITAL_COMMUNITY)
Admission: EM | Admit: 2018-08-11 | Discharge: 2018-08-12 | Disposition: A | Discharge status: Home / Self Care | Payer: Medicaid Other | Attending: Emergency Medicine | Admitting: Emergency Medicine

## 2018-08-11 DIAGNOSIS — Z5321 Procedure and treatment not carried out due to patient leaving prior to being seen by health care provider: Secondary | ICD-10-CM | POA: Diagnosis not present

## 2018-08-11 DIAGNOSIS — R739 Hyperglycemia, unspecified: Secondary | ICD-10-CM | POA: Diagnosis not present

## 2018-08-11 LAB — CBC
HCT: 46.3 % (ref 39.0–52.0)
Hemoglobin: 15.2 g/dL (ref 13.0–17.0)
MCH: 26.9 pg (ref 26.0–34.0)
MCHC: 32.8 g/dL (ref 30.0–36.0)
MCV: 81.8 fL (ref 80.0–100.0)
Platelets: 221 10*3/uL (ref 150–400)
RBC: 5.66 MIL/uL (ref 4.22–5.81)
RDW: 12.7 % (ref 11.5–15.5)
WBC: 6.2 10*3/uL (ref 4.0–10.5)
nRBC: 0 % (ref 0.0–0.2)

## 2018-08-11 LAB — URINALYSIS, ROUTINE W REFLEX MICROSCOPIC
Bacteria, UA: NONE SEEN
Bilirubin Urine: NEGATIVE
Glucose, UA: >=500 mg/dL — AB
Hgb urine dipstick: NEGATIVE
Ketones, ur: 5 mg/dL — AB
Leukocytes,Ua: NEGATIVE
Nitrite: NEGATIVE
PROTEIN: NEGATIVE mg/dL
Specific Gravity, Urine: 1.02 (ref 1.005–1.030)
pH: 6 (ref 5.0–8.0)

## 2018-08-11 LAB — POCT I-STAT EG7
Acid-base deficit: 2 mmol/L (ref 0.0–2.0)
BICARBONATE: 23.3 mmol/L (ref 20.0–28.0)
Calcium, Ion: 1.15 mmol/L (ref 1.15–1.40)
HCT: 47 % (ref 39.0–52.0)
Hemoglobin: 16 g/dL (ref 13.0–17.0)
O2 Saturation: 71 %
PCO2 VEN: 42.1 mmHg — AB (ref 44.0–60.0)
Potassium: 4.7 mmol/L (ref 3.5–5.1)
Sodium: 121 mmol/L — ABNORMAL LOW (ref 135–145)
TCO2: 25 mmol/L (ref 22–32)
pH, Ven: 7.35 (ref 7.250–7.430)
pO2, Ven: 39 mmHg (ref 32.0–45.0)

## 2018-08-11 LAB — CBG MONITORING, ED: Glucose-Capillary: >600 mg/dL (ref 70–99)

## 2018-08-11 LAB — I-STAT TROPONIN, ED: Troponin i, poc: 0 ng/mL (ref 0.00–0.08)

## 2018-08-11 NOTE — ED Triage Notes (Signed)
Pt left AMA from [facility] ICU/SDU 2 days ago. Pt frequently in DKA, also states "he had a heart attack" while he was there. According to notes EKG showed questionable STEMI but was reviewed by cards who did not see anything concerning.

## 2018-08-12 LAB — BASIC METABOLIC PANEL
Anion gap: 18 — ABNORMAL HIGH (ref 5–15)
BUN: 19 mg/dL (ref 6–20)
CO2: 20 mmol/L — AB (ref 22–32)
Calcium: 9.1 mg/dL (ref 8.9–10.3)
Chloride: 86 mmol/L — ABNORMAL LOW (ref 98–111)
Creatinine, Ser: 1.13 mg/dL (ref 0.61–1.24)
GFR calc non Af Amer: >60 mL/min (ref >60)
Glucose, Bld: 1011 mg/dL (ref 70–99)
Potassium: 4.8 mmol/L (ref 3.5–5.1)
Sodium: 124 mmol/L — ABNORMAL LOW (ref 135–145)

## 2018-08-12 NOTE — ED Triage Notes (Signed)
Pt LWBS. Attempted to call pt but no response.

## 2018-08-12 NOTE — ED Notes (Signed)
Glucose 1,011

## 2018-08-12 NOTE — ED Notes (Signed)
Rancour MD aware pt left

## 2018-08-13 NOTE — ED Provider Notes (Signed)
Patient left without being seen on overnight shift early morning February 13.  His labs show severe hyperglycemia with anion gap acidosis consistent with DKA.  Called patient this morning. He states he is feeling terrible.  Advised him to return to the hospital soon as possible for treatment of his DKA which can be life-threatening.  He states he does not have a ride and he was told to call an ambulance if he has to.  He agrees to return to the hospital today.   Glynn Octave, MD 08/13/18 [phone]

## 2018-08-17 ENCOUNTER — Encounter (HOSPITAL_COMMUNITY): Payer: Self-pay

## 2018-08-17 DIAGNOSIS — Z79899 Other long term (current) drug therapy: Secondary | ICD-10-CM | POA: Diagnosis not present

## 2018-08-17 DIAGNOSIS — F603 Borderline personality disorder: Secondary | ICD-10-CM | POA: Insufficient documentation

## 2018-08-17 DIAGNOSIS — Z9114 Patient's other noncompliance with medication regimen: Secondary | ICD-10-CM | POA: Diagnosis not present

## 2018-08-17 DIAGNOSIS — R0789 Other chest pain: Secondary | ICD-10-CM | POA: Insufficient documentation

## 2018-08-17 DIAGNOSIS — F902 Attention-deficit hyperactivity disorder, combined type: Secondary | ICD-10-CM | POA: Insufficient documentation

## 2018-08-17 DIAGNOSIS — Z8249 Family history of ischemic heart disease and other diseases of the circulatory system: Secondary | ICD-10-CM | POA: Diagnosis not present

## 2018-08-17 DIAGNOSIS — D573 Sickle-cell trait: Secondary | ICD-10-CM | POA: Diagnosis not present

## 2018-08-17 DIAGNOSIS — Z794 Long term (current) use of insulin: Secondary | ICD-10-CM | POA: Insufficient documentation

## 2018-08-17 DIAGNOSIS — F333 Major depressive disorder, recurrent, severe with psychotic symptoms: Secondary | ICD-10-CM | POA: Diagnosis not present

## 2018-08-17 DIAGNOSIS — Z7722 Contact with and (suspected) exposure to environmental tobacco smoke (acute) (chronic): Secondary | ICD-10-CM | POA: Insufficient documentation

## 2018-08-17 DIAGNOSIS — N529 Male erectile dysfunction, unspecified: Secondary | ICD-10-CM | POA: Insufficient documentation

## 2018-08-17 DIAGNOSIS — E1010 Type 1 diabetes mellitus with ketoacidosis without coma: Principal | ICD-10-CM | POA: Insufficient documentation

## 2018-08-17 NOTE — ED Triage Notes (Signed)
Pt seen recently for high blood sugar last week, admitted in ICU. Pt states he has been taking his insulin. Last blood sugar check 2300 meter said unreadable. Pt states he has also had chest pain for the last few weeks. Pt states he has a subscription for [employer].

## 2018-08-18 ENCOUNTER — Observation Stay (HOSPITAL_BASED_OUTPATIENT_CLINIC_OR_DEPARTMENT_OTHER): Payer: Medicaid Other

## 2018-08-18 ENCOUNTER — Emergency Department (HOSPITAL_COMMUNITY): Payer: Medicaid Other

## 2018-08-18 ENCOUNTER — Other Ambulatory Visit: Payer: Self-pay

## 2018-08-18 ENCOUNTER — Observation Stay (HOSPITAL_COMMUNITY)
Admission: EM | Admit: 2018-08-18 | Discharge: 2018-08-20 | Disposition: A | Discharge status: Home / Self Care | Payer: Medicaid Other | Attending: Family Medicine | Admitting: Family Medicine

## 2018-08-18 DIAGNOSIS — R0789 Other chest pain: Secondary | ICD-10-CM

## 2018-08-18 DIAGNOSIS — E1010 Type 1 diabetes mellitus with ketoacidosis without coma: Secondary | ICD-10-CM

## 2018-08-18 DIAGNOSIS — E0810 Diabetes mellitus due to underlying condition with ketoacidosis without coma: Secondary | ICD-10-CM

## 2018-08-18 DIAGNOSIS — F314 Bipolar disorder, current episode depressed, severe, without psychotic features: Secondary | ICD-10-CM | POA: Diagnosis present

## 2018-08-18 DIAGNOSIS — R079 Chest pain, unspecified: Secondary | ICD-10-CM

## 2018-08-18 DIAGNOSIS — E1110 Type 2 diabetes mellitus with ketoacidosis without coma: Secondary | ICD-10-CM | POA: Diagnosis present

## 2018-08-18 DIAGNOSIS — E1042 Type 1 diabetes mellitus with diabetic polyneuropathy: Secondary | ICD-10-CM | POA: Diagnosis present

## 2018-08-18 DIAGNOSIS — E109 Type 1 diabetes mellitus without complications: Secondary | ICD-10-CM

## 2018-08-18 DIAGNOSIS — F329 Major depressive disorder, single episode, unspecified: Secondary | ICD-10-CM | POA: Diagnosis present

## 2018-08-18 DIAGNOSIS — F32A Depression, unspecified: Secondary | ICD-10-CM | POA: Diagnosis present

## 2018-08-18 LAB — RAPID URINE DRUG SCREEN, HOSP PERFORMED
Amphetamines: NOT DETECTED
Barbiturates: NOT DETECTED
Benzodiazepines: NOT DETECTED
Cocaine: NOT DETECTED
Opiates: NOT DETECTED
TETRAHYDROCANNABINOL: NOT DETECTED

## 2018-08-18 LAB — CBC
HCT: 46.3 % (ref 39.0–52.0)
Hemoglobin: 15.4 g/dL (ref 13.0–17.0)
MCH: 27.7 pg (ref 26.0–34.0)
MCHC: 33.3 g/dL (ref 30.0–36.0)
MCV: 83.3 fL (ref 80.0–100.0)
Platelets: 247 10*3/uL (ref 150–400)
RBC: 5.56 MIL/uL (ref 4.22–5.81)
RDW: 12.5 % (ref 11.5–15.5)
WBC: 8.4 10*3/uL (ref 4.0–10.5)
nRBC: 0 % (ref 0.0–0.2)

## 2018-08-18 LAB — GLUCOSE, CAPILLARY
Glucose-Capillary: 160 mg/dL — ABNORMAL HIGH (ref 70–99)
Glucose-Capillary: 167 mg/dL — ABNORMAL HIGH (ref 70–99)
Glucose-Capillary: 247 mg/dL — ABNORMAL HIGH (ref 70–99)
Glucose-Capillary: 203 mg/dL — ABNORMAL HIGH (ref 70–99)
Glucose-Capillary: 241 mg/dL — ABNORMAL HIGH (ref 70–99)
Glucose-Capillary: 347 mg/dL — ABNORMAL HIGH (ref 70–99)
Glucose-Capillary: 345 mg/dL — ABNORMAL HIGH (ref 70–99)

## 2018-08-18 LAB — BASIC METABOLIC PANEL
ANION GAP: 23 — AB (ref 5–15)
Anion gap: 8 (ref 5–15)
Anion gap: 8 (ref 5–15)
BUN: 11 mg/dL (ref 6–20)
BUN: 11 mg/dL (ref 6–20)
BUN: 20 mg/dL (ref 6–20)
CALCIUM: 7.4 mg/dL — AB (ref 8.9–10.3)
CHLORIDE: 88 mmol/L — AB (ref 98–111)
CO2: 14 mmol/L — ABNORMAL LOW (ref 22–32)
CO2: 20 mmol/L — AB (ref 22–32)
CO2: 25 mmol/L (ref 22–32)
Calcium: 8.4 mg/dL — ABNORMAL LOW (ref 8.9–10.3)
Calcium: 8.7 mg/dL — ABNORMAL LOW (ref 8.9–10.3)
Chloride: 107 mmol/L (ref 98–111)
Chloride: 103 mmol/L (ref 98–111)
Creatinine, Ser: 0.62 mg/dL (ref 0.61–1.24)
Creatinine, Ser: 0.71 mg/dL (ref 0.61–1.24)
Creatinine, Ser: 1.19 mg/dL (ref 0.61–1.24)
GFR calc Af Amer: >60 mL/min (ref >60)
GFR calc Af Amer: >60 mL/min (ref >60)
GFR calc non Af Amer: >60 mL/min (ref >60)
GFR calc non Af Amer: >60 mL/min (ref >60)
GFR calc non Af Amer: >60 mL/min (ref >60)
Glucose, Bld: 190 mg/dL — ABNORMAL HIGH (ref 70–99)
Glucose, Bld: 329 mg/dL — ABNORMAL HIGH (ref 70–99)
Glucose, Bld: 711 mg/dL (ref 70–99)
Potassium: 3.3 mmol/L — ABNORMAL LOW (ref 3.5–5.1)
Potassium: 4.3 mmol/L (ref 3.5–5.1)
Potassium: 4.4 mmol/L (ref 3.5–5.1)
Sodium: 125 mmol/L — ABNORMAL LOW (ref 135–145)
Sodium: 135 mmol/L (ref 135–145)
Sodium: 136 mmol/L (ref 135–145)

## 2018-08-18 LAB — URINALYSIS, ROUTINE W REFLEX MICROSCOPIC
BILIRUBIN URINE: NEGATIVE
Bacteria, UA: NONE SEEN
Glucose, UA: >=500 mg/dL — AB
Hgb urine dipstick: NEGATIVE
Ketones, ur: 80 mg/dL — AB
Leukocytes,Ua: NEGATIVE
Nitrite: NEGATIVE
Protein, ur: NEGATIVE mg/dL
Specific Gravity, Urine: 1.02 (ref 1.005–1.030)
pH: 5 (ref 5.0–8.0)

## 2018-08-18 LAB — CBG MONITORING, ED
Glucose-Capillary: 164 mg/dL — ABNORMAL HIGH (ref 70–99)
Glucose-Capillary: 202 mg/dL — ABNORMAL HIGH (ref 70–99)
Glucose-Capillary: 210 mg/dL — ABNORMAL HIGH (ref 70–99)
Glucose-Capillary: 315 mg/dL — ABNORMAL HIGH (ref 70–99)
Glucose-Capillary: 392 mg/dL — ABNORMAL HIGH (ref 70–99)
Glucose-Capillary: >600 mg/dL (ref 70–99)

## 2018-08-18 LAB — BLOOD GAS, VENOUS
Acid-base deficit: 11.3 mmol/L — ABNORMAL HIGH (ref 0.0–2.0)
Bicarbonate: 13.5 mmol/L — ABNORMAL LOW (ref 20.0–28.0)
O2 SAT: 88.5 %
PO2 VEN: 60.8 mmHg — AB (ref 32.0–45.0)
Patient temperature: 98.6
pCO2, Ven: 28.6 mmHg — ABNORMAL LOW (ref 44.0–60.0)
pH, Ven: 7.297 (ref 7.250–7.430)

## 2018-08-18 LAB — D-DIMER, QUANTITATIVE: D-Dimer, Quant: 0.3 ug/mL-FEU (ref 0.00–0.50)

## 2018-08-18 LAB — ECHOCARDIOGRAM COMPLETE

## 2018-08-18 LAB — I-STAT TROPONIN, ED: Troponin i, poc: 0 ng/mL (ref 0.00–0.08)

## 2018-08-18 LAB — C-REACTIVE PROTEIN: CRP: 0.9 mg/dL (ref <1.0)

## 2018-08-18 LAB — ETHANOL: Alcohol, Ethyl (B): <10 mg/dL (ref <10)

## 2018-08-18 LAB — SEDIMENTATION RATE: Sed Rate: 5 mm/hr (ref 0–16)

## 2018-08-18 MED ORDER — SODIUM CHLORIDE 0.9 % IV BOLUS
1000.0000 mL | Freq: Once | INTRAVENOUS | Status: AC
  Administered 2018-08-18: 1000 mL via INTRAVENOUS

## 2018-08-18 MED ORDER — HYDROXYZINE HCL 25 MG PO TABS
100.0000 mg | ORAL_TABLET | Freq: Every day | ORAL | Status: DC | PRN
  Administered 2018-08-19 (×2): 100 mg via ORAL
  Filled 2018-08-18 (×2): qty 4

## 2018-08-18 MED ORDER — ACETAMINOPHEN 650 MG RE SUPP: 650.0000 mg | Freq: Four times a day (QID) | RECTAL | Status: DC | PRN

## 2018-08-18 MED ORDER — LACTATED RINGERS IV SOLN: INTRAVENOUS | Status: DC

## 2018-08-18 MED ORDER — INSULIN REGULAR(HUMAN) IN NACL 100-0.9 UT/100ML-% IV SOLN
INTRAVENOUS | Status: DC
  Administered 2018-08-18: 5.4 [IU]/h via INTRAVENOUS
  Filled 2018-08-18: qty 100

## 2018-08-18 MED ORDER — ALUM & MAG HYDROXIDE-SIMETH 200-200-20 MG/5ML PO SUSP
30.0000 mL | Freq: Once | ORAL | Status: AC
  Administered 2018-08-18: 30 mL via ORAL
  Filled 2018-08-18: qty 30

## 2018-08-18 MED ORDER — INSULIN GLARGINE 100 UNIT/ML ~~LOC~~ SOLN
20.0000 [IU] | Freq: Every day | SUBCUTANEOUS | Status: DC
  Administered 2018-08-18: 20 [IU] via SUBCUTANEOUS
  Filled 2018-08-18 (×3): qty 0.2

## 2018-08-18 MED ORDER — BUPRENORPHINE HCL 8 MG SL SUBL
8.0000 mg | SUBLINGUAL_TABLET | Freq: Two times a day (BID) | SUBLINGUAL | Status: DC
  Administered 2018-08-18 – 2018-08-20 (×5): 8 mg via SUBLINGUAL
  Filled 2018-08-18 (×4): qty 1

## 2018-08-18 MED ORDER — POTASSIUM CHLORIDE CRYS ER 20 MEQ PO TBCR
40.0000 meq | EXTENDED_RELEASE_TABLET | Freq: Once | ORAL | Status: AC
  Administered 2018-08-18: 40 meq via ORAL
  Filled 2018-08-18: qty 2

## 2018-08-18 MED ORDER — INSULIN ASPART 100 UNIT/ML ~~LOC~~ SOLN
0.0000 [IU] | Freq: Every day | SUBCUTANEOUS | Status: DC
  Administered 2018-08-19: 3 [IU] via SUBCUTANEOUS

## 2018-08-18 MED ORDER — ENOXAPARIN SODIUM 40 MG/0.4ML ~~LOC~~ SOLN
40.0000 mg | SUBCUTANEOUS | Status: DC
  Filled 2018-08-18: qty 0.4

## 2018-08-18 MED ORDER — ONDANSETRON HCL 4 MG PO TABS: 4.0000 mg | ORAL_TABLET | Freq: Four times a day (QID) | ORAL | Status: DC | PRN

## 2018-08-18 MED ORDER — TRAZODONE HCL 100 MG PO TABS
100.0000 mg | ORAL_TABLET | Freq: Every evening | ORAL | Status: DC | PRN
  Administered 2018-08-19 (×2): 100 mg via ORAL
  Filled 2018-08-18 (×2): qty 1

## 2018-08-18 MED ORDER — POTASSIUM CHLORIDE 10 MEQ/100ML IV SOLN
10.0000 meq | INTRAVENOUS | Status: AC
  Administered 2018-08-18 (×2): 10 meq via INTRAVENOUS
  Filled 2018-08-18 (×2): qty 100

## 2018-08-18 MED ORDER — ACETAMINOPHEN 325 MG PO TABS
650.0000 mg | ORAL_TABLET | Freq: Four times a day (QID) | ORAL | Status: DC | PRN
  Administered 2018-08-18 – 2018-08-19 (×3): 650 mg via ORAL
  Filled 2018-08-18 (×3): qty 2

## 2018-08-18 MED ORDER — DEXTROSE-NACL 5-0.45 % IV SOLN
INTRAVENOUS | Status: DC
  Administered 2018-08-18: 11:00:00 via INTRAVENOUS

## 2018-08-18 MED ORDER — DEXTROSE-NACL 5-0.45 % IV SOLN
INTRAVENOUS | Status: DC
  Administered 2018-08-18: 06:00:00 via INTRAVENOUS

## 2018-08-18 MED ORDER — IBUPROFEN 200 MG PO TABS
400.0000 mg | ORAL_TABLET | Freq: Once | ORAL | Status: AC
  Administered 2018-08-18: 400 mg via ORAL
  Filled 2018-08-18: qty 2

## 2018-08-18 MED ORDER — ADULT MULTIVITAMIN W/MINERALS CH
1.0000 | ORAL_TABLET | Freq: Every day | ORAL | Status: DC
  Administered 2018-08-18 – 2018-08-20 (×3): 1 via ORAL
  Filled 2018-08-18 (×3): qty 1

## 2018-08-18 MED ORDER — ONDANSETRON HCL 4 MG/2ML IJ SOLN: 4.0000 mg | Freq: Four times a day (QID) | INTRAMUSCULAR | Status: DC | PRN

## 2018-08-18 MED ORDER — ALUM & MAG HYDROXIDE-SIMETH 200-200-20 MG/5ML PO SUSP
30.0000 mL | Freq: Once | ORAL | Status: DC
  Filled 2018-08-18: qty 30

## 2018-08-18 MED ORDER — INSULIN GLARGINE 100 UNIT/ML ~~LOC~~ SOLN
20.0000 [IU] | Freq: Two times a day (BID) | SUBCUTANEOUS | Status: DC
  Administered 2018-08-18 – 2018-08-20 (×4): 20 [IU] via SUBCUTANEOUS
  Filled 2018-08-18 (×5): qty 0.2

## 2018-08-18 MED ORDER — INSULIN ASPART 100 UNIT/ML ~~LOC~~ SOLN
0.0000 [IU] | Freq: Three times a day (TID) | SUBCUTANEOUS | Status: DC
  Administered 2018-08-18 (×2): 5 [IU] via SUBCUTANEOUS
  Administered 2018-08-19: 11 [IU] via SUBCUTANEOUS
  Administered 2018-08-19: 15 [IU] via SUBCUTANEOUS
  Administered 2018-08-19: 5 [IU] via SUBCUTANEOUS
  Administered 2018-08-20: 15 [IU] via SUBCUTANEOUS
  Administered 2018-08-20: 2 [IU] via SUBCUTANEOUS

## 2018-08-18 NOTE — Progress Notes (Addendum)
Inpatient Diabetes Program Recommendations  AACE/ADA: New Consensus Statement on Inpatient Glycemic Control (2015)  Target Ranges:  Prepandial:   less than 140 mg/dL      Peak postprandial:   less than 180 mg/dL (1-2 hours)      Critically ill patients:  140 - 180 mg/dL   Lab Results  Component Value Date   GLUCAP 203 (H) 08/18/2018   HGBA1C 11.5 (H) 08/09/2018    Review of Glycemic Control  Diabetes history: DM1 Outpatient Diabetes medications: Lantus 30 units bid, Novolog 0-15 units tidwc Current orders for Inpatient glycemic control: Lantus 20 units QD, Novolog 0-15 units tidwc and hs  Pt states he ran out of insulin prior to coming to ED. States he has no money for his insulin - even though prescriptions are only $4. Pt states he doesn't care about anything, said he needs to go to psych hospital. Complaining about CHO mod diet. Pt knows what to do with respect to his diabetes management, he states he just doesn't care.   Will need meal coverage insulin since pt is Type 1 DM.   Inpatient Diabetes Program Recommendations:     Lantus 20 units bid Novolog 8 units tidwc Decrease Novolog to sensitive since Type 1 - Novolog 0-9 units tidwc and hs Needs psych consult.  Will continue to follow trends with CBGs. Discussed with RN.   Thank you. Ailene Ards, RD, LDN, CDE Inpatient Diabetes Coordinator [phone]

## 2018-08-18 NOTE — H&P (Addendum)
[facility] History and Physical  [name] [name]. SRP:[id number] DOB: 03-17-2001 DOA: 08/18/2018   PCP: Unknown Specialists: None  Chief Complaint: High blood glucose levels  HPI: [name] [name]. is a 18 y.o. male with a past medical history of insulin-dependent diabetes, noncompliance who was recently hospitalized on 2/10 for diabetic ketoacidosis and chest pain.  He had nonspecific EKG changes.  He ruled out for acute coronary syndrome.  Patient left AGAINST MEDICAL ADVICE.  He was seen in the emergency department few days ago.  Was found to have significantly elevated glucose levels.  However patient again left AGAINST MEDICAL ADVICE from the emergency department.  He comes in last night with complaints of elevated blood glucose levels.  He also mentions that he continues to have chest pain that he reported during his previous hospitalization.  Patient somnolent but easily arousable.  Does not wish to engage with me.  Does not answer my questions.  History was extremely limited.  When asked about chest pain he does mention that the pain is on the left side of his chest however he does not appear to be in any discomfort.  Patient denies any nausea vomiting.  No abdominal pain.  No shortness of breath.  No dizziness or lightheadedness.  In the emergency department patient was found to be in diabetic ketoacidosis.  He was started on insulin infusion.  Glucose levels have been improving.  Troponins were normal.  He will be brought into the hospital for further management.  Home Medications: Prior to Admission medications   Medication Sig Start Date End Date Taking? Authorizing Provider  albuterol (PROVENTIL HFA;VENTOLIN HFA) 108 (90 Base) MCG/ACT inhaler Inhale 1 puff into the lungs every 4 (four) hours as needed for wheezing or shortness of breath.   Yes [provider]  hydrOXYzine (ATARAX/VISTARIL) 50 MG tablet Take 100 mg by mouth daily as needed for anxiety.    Yes  [provider]  insulin aspart (NOVOLOG FLEXPEN) 100 UNIT/ML FlexPen Inject 0-15 Units into the skin 3 (three) times daily with meals. (Sliding scale): For diabetes management 03/09/18  Yes Lindell Spar I, NP  insulin glargine (LANTUS) 100 UNIT/ML injection Inject 0.3 mLs (30 Units total) into the skin 2 (two) times daily. 07/12/18  Yes Ward, Kristen N, DO  SUBOXONE 8-2 MG FILM Place 1 Film under the tongue 2 (two) times daily. 08/11/18  Yes [provider]  traZODone (DESYREL) 50 MG tablet Take 100 mg by mouth at bedtime as needed for sleep.    Yes [provider]  glucose blood (ACCU-CHEK GUIDE) test strip Check glucose 6x daily: For blood sugar checks 03/22/18   Long, Wonda Olds, MD    Allergies:  Allergies  Allergen Reactions  . Bee Venom Anaphylaxis  . Fish Allergy Anaphylaxis  . Shellfish Allergy Anaphylaxis  . Tea Anaphylaxis    Past Medical History: Past Medical History:  Diagnosis Date  . ADD (attention deficit disorder)   . Allergy   . Anxiety   . Asthma   . Depression   . Epileptic seizure ([place])    "both petit and grand mal; last sz ~ 2 wk ago" (06/17/2013)  . Headache(784.0)    "2-3 times/wk usually" (06/17/2013)  . Heart murmur    "heard a slight one earlier today" (06/17/2013)  . Homeless   . Migraine    "maybe once/month; it's severe" (06/17/2013)  . ODD (oppositional defiant disorder)   . Sickle cell trait ([name])   . Type  I diabetes mellitus ([place])    "that's what they're thinking now" (06/17/2013)  . Vision abnormalities    "takes him longer to focus cause; from his sz" (06/17/2013)    Past Surgical History:  Procedure Laterality Date  . CIRCUMCISION  2000  . FINGER SURGERY Left 2001   "crushed pinky; had to repair it" (06/17/2013)    Social History: His social situation is unclear.  Patient does not answer any questions.  Seems to be independent with daily activities.   Family History:  Family History  Problem Relation Age of  Onset  . Asthma Mother   . COPD Mother   . Other Mother        possible autoimmune, unclear  . Diabetes Maternal Grandmother   . Heart disease Maternal Grandmother   . Hypertension Maternal Grandmother   . Mental illness Maternal Grandmother   . Heart disease Maternal Grandfather   . Hyperlipidemia Paternal Grandmother   . Hyperlipidemia Paternal Grandfather   . Migraines Sister        Hemiplegic Migraines      Review of Systems -unable to do as the patient does not cooperate  Physical Examination  Vitals:   08/18/18 0300 08/18/18 0533 08/18/18 0616 08/18/18 0811  BP: (!) 119/55 132/67 (!) 101/49 137/70  Pulse:  80 80 75  Resp: (!) 24 20 (!) 22 14  Temp:  98.9 F (37.2 C) 98.9 F (37.2 C) 97.6 F (36.4 C)  TempSrc:  Oral Oral Oral  SpO2:  99% 100% 100%    BP 137/70 (BP Location: Right Arm)   Pulse 75   Temp 97.6 F (36.4 C) (Oral)   Resp 14   SpO2 100%   General appearance: alert, cooperative, appears stated age, distracted and no distress Head: Normocephalic, without obvious abnormality, atraumatic Eyes: conjunctivae/corneas clear. PERRL, EOM's intact. Throat: lips, mucosa, and tongue normal; teeth and gums normal Resp: clear to auscultation bilaterally Cardio: regular rate and rhythm, S1, S2 normal, no murmur, click, rub or gallop and Left chest wall was tender to palpation.  Reproducing the pain. GI: soft, non-tender; bowel sounds normal; no masses,  no organomegaly Extremities: extremities normal, atraumatic, no cyanosis or edema Pulses: 2+ and symmetric Skin: Skin color, texture, turgor normal. No rashes or lesions Neurologic: Somnolent but arousable. No Focal neurological deficits.   Labs on Admission: I have personally reviewed following labs and imaging studies  CBC: Recent Labs  Lab 08/11/18 2323 08/11/18 2329 08/18/18 0001  WBC 6.2  --  8.4  HGB 15.2 16.0 15.4  HCT 46.3 47.0 46.3  MCV 81.8  --  83.3  PLT 221  --  882   Basic Metabolic  Panel: Recent Labs  Lab 08/11/18 2323 08/11/18 2329 08/18/18 0001 08/18/18 0747  NA 124* 121* 125* 135  K 4.8 4.7 4.4 3.3*  CL 86*  --  88* 107  CO2 20*  --  14* 20*  GLUCOSE 1,011*  --  711* 190*  BUN 19  --  20 11  CREATININE 1.13  --  1.19 0.62  CALCIUM 9.1  --  8.4* 7.4*   GFR: Estimated Creatinine Clearance: 166.4 mL/min (by C-G formula based on SCr of 0.62 mg/dL).  CBG: Recent Labs  Lab 08/18/18 0258 08/18/18 0415 08/18/18 0601 08/18/18 0709 08/18/18 0809  GLUCAP 392* 315* 202* 210* 164*    Radiological Exams on Admission: Dg Chest 2 View  Result Date: 08/18/2018 CLINICAL DATA:  Mid chest pain for over 1 week. EXAM: CHEST -  2 VIEW COMPARISON:  08/09/2018 FINDINGS: The heart size and mediastinal contours are within normal limits. Both lungs are clear. The visualized skeletal structures are unremarkable. IMPRESSION: No active cardiopulmonary disease. Electronically Signed   By: Ashley Royalty M.D.   On: 08/18/2018 01:35    My interpretation of Electrocardiogram: Mildly sinus tachycardic at 101 bpm.  Normal axis.  Intervals appear to be normal.  T wave inversion noted in inferior leads.  Nonspecific ST segment changes noted in other leads.  Similar to previous EKG with subtle changes.   Problem List  Principal Problem:   DKA (diabetic ketoacidoses) ([place]) Active Problems:   Diabetic peripheral neuropathy associated with type 1 diabetes mellitus ([place])   Assessment: This is a 18 year old male with a past medical history of insulin-dependent diabetes who is noncompliant.  He does not take his insulin for reasons that are not entirely clear.  He comes in with hyperglycemia.  He is found to have diabetic ketoacidosis.  He also mentions left-sided chest pain which is thought to be musculoskeletal.  He has nonspecific EKG changes.  Plan:  1. Diabetic ketoacidosis in the setting of known insulin-dependent diabetes mellitus: His HbA1c 11.5 earlier this month.  Patient admits  to noncompliance.  Patient had an elevated anion gap.  He was placed on insulin infusion.  Basic metabolic panel repeated this morning and shows that anion gap has closed.  His CBGs have improved.  He does not have any nausea or vomiting.  His bicarbonate has improved.  He will be transitioned to Lantus.  Potassium noted to be low and will be supplemented.  Diabetes coordinator to educate the patient.  No obvious source of infection.  Precipitant factor appears to be noncompliance with insulin regimen.  2.  Chest pain: Appears to be musculoskeletal as he is tender to palpation on the left side of the chest.  No skin lesions identified.  Chest x-ray does not show any abnormalities.  EKG is abnormal but similar to his previous EKG.  His troponins were negative.  ESR CRP normal.  D-dimer normal.  Proceed with echocardiogram for now.  May benefit from outpatient cardiology evaluation.  3. Noted to be on Suboxone: There is has been verified by pharmacy.  This is presumably for opioid use disorder.  Continue for now.  Based on the narcotic prescription database it appears that this was prescribed on 08/11/2018 by Mariel Aloe.   DVT Prophylaxis: Lovenox Code Status: Full code Family Communication: Discussed with the patient.  No family at bedside Disposition: Anticipate he will return home when ready for discharge Consults called: None Admission Status: Observation  Severity of Illness: The appropriate patient status for this patient is OBSERVATION. Observation status is judged to be reasonable and necessary in order to provide the required intensity of service to ensure the patient's safety. The patient's presenting symptoms, physical exam findings, and initial radiographic and laboratory data in the context of their medical condition is felt to place them at decreased risk for further clinical deterioration. Furthermore, it is anticipated that the patient will be medically stable for discharge from the  hospital within 2 midnights of admission. The following factors support the patient status of observation.   " The patient's presenting symptoms include feeling unwell, hyperglycemia. " The physical exam findings include reproducible left-sided chest pain. " The initial radiographic and laboratory data are remarkable for diabetic ketoacidosis.   Further management decisions will depend on results of further testing and patient's response to treatment.   [name] [name]  [place]  [facility], [email or web address]  08/18/2018, 9:19 AM
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
## 2018-08-18 NOTE — Progress Notes (Signed)
  Echocardiogram 2D Echocardiogram has been performed.  [name] [name] 08/18/2018, 9:35 AM

## 2018-08-18 NOTE — ED Notes (Signed)
Pt keeps removing pulse 0x  From finger

## 2018-08-18 NOTE — ED Provider Notes (Signed)
[facility]EMERGENCY DEPT Provider Note  CSN: [id number] Arrival date & time: 08/17/18 2341  Chief Complaint(s) Hyperglycemia  HPI [name] [name]. is a 18 y.o. male with extensive past medical history listed below including type I diabetic on insulin who left AMA from the hospital yesterday after being admitted for DKA.  Patient reports that he had to leave because he had an appointment with his parole officer who threatened to arrest him if he missed this appointment.  Patient returned due to elevated sugars.  Patient states that he does not have his insulin.  He does have a history of noncompliance.  Patient also reports that he had left anterior and shoulder pain during his admission.  He was ruled out for ACS during his admission.  Reports that the chest pain is sharp in nature and shoots of the left shoulder and arm.  It is exacerbated with movement of the left shoulder and palpation of the anterior chest.  He denies any trauma.  No cough or congestion.  Denies any other physical complaints.   Hyperglycemia    Past Medical History Past Medical History:  Diagnosis Date  . ADD (attention deficit disorder)   . Allergy   . Anxiety   . Asthma   . Depression   . Epileptic seizure ([facility])    "both petit and grand mal; last sz ~ 2 wk ago" (06/17/2013)  . Headache(784.0)    "2-3 times/wk usually" (06/17/2013)  . Heart murmur    "heard a slight one earlier today" (06/17/2013)  . Homeless   . Migraine    "maybe once/month; it's severe" (06/17/2013)  . ODD (oppositional defiant disorder)   . Sickle cell trait ([facility])   . Type I diabetes mellitus ([facility])    "that's what they're thinking now" (06/17/2013)  . Vision abnormalities    "takes him longer to focus cause; from his sz" (06/17/2013)   Patient Active Problem List   Diagnosis Date Noted  . Borderline personality disorder ([facility]) 02/26/2018  . MDD (major depressive disorder), recurrent, severe, with psychosis  (H[facility]) 02/25/2018  . 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 DKA, type 1 ([facility]CC) 07/16/2017  . Nausea & vomiting 07/16/2017  . Uncontrolled type 1 diabetes circulatory disorder erectile dysfunction (HCC)   . DKA (diabetic ketoacidoses) ([facility]) 12/18/2016  . History of seizures 12/01/2016  . History of migraine 12/01/2016  . Disordered eating 03/08/2015  . Ketonuria   . Adjustment reaction to medical therapy   . Non compliance w medication regimen   . 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 Dehydration 08/22/2014  . Hyperglycemia due to type 1 diabetes mellitus (HCC) 08/21/2014  . Type 1 diabetes mellitus with hyperglycemia ([facility])   . Noncompliance   . Generalized abdominal pain   . Glycosuria   . Depression 07/12/2014  . Involuntary movements 06/13/2014  . Bilateral leg pain 06/13/2014  . 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 Suicidal ideation 03/31/2014  . ODD (oppositional defiant disorder) 03/03/2014  . MDD (major depressive disorder), recurrent episode, moderate (HCC) 02/16/2014  . Attention deficit hyperactivity disorder (ADHD), combined type, moderate 02/16/2014  . Microcytic anemia 02/15/2014  . Hyperglycemia 02/14/2014  . Goiter 02/04/2014  . 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 Acquired acanthosis nigricans 02/04/2014  . Obesity, morbid (HCC) 02/04/2014  . Insulin resistance 02/04/2014  . Hyperinsulinemia 02/04/2014  .  Hypoglycemia associated with diabetes (HCC) 02/02/2014  . Maladaptive health behaviors affecting medical condition 02/02/2014  . Hypoglycemia 02/02/2014  . Type 1 diabetes mellitus in patient 3213 to 20 years of age with hemoglobin A1c goal of less than 7.5% (HCC) 01/26/2014  . 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  . 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 Migraine without aura 12/13/2013  . Body mass index, pediatric, greater than or equal to 95th percentile for age 55/16/2015  . Asthma 12/13/2013  . Hypoglycemia unawareness in type 1 diabetes mellitus (HCC) 08/08/2013  . Seizure disorder (HCC) 07/06/2013  . Short-term memory loss 07/06/2013  . Sickle cell trait (HCC) 07/06/2013  . Diabetes (HCC) 06/17/2013  . Diabetes mellitus, new onset (HCC) 06/17/2013   Home Medication(s) Prior to Admission medications   Medication Sig Start Date End Date Taking? Authorizing Provider  albuterol (PROVENTIL HFA;VENTOLIN HFA) 108 (90 Base) MCG/ACT inhaler Inhale 1 puff into the lungs every 4 (four) hours as needed for wheezing or shortness of breath.    [provider]  glucose blood (ACCU-CHEK GUIDE) test strip Check glucose 6x daily: For blood sugar checks 03/22/18   Long, Arlyss RepressJoshua G, MD  hydrOXYzine (ATARAX/VISTARIL) 50 MG tablet Take 50 mg by mouth daily as needed for anxiety.    [provider]  insulin aspart (NOVOLOG FLEXPEN) 100 UNIT/ML FlexPen Inject 0-15 Units into the skin 3 (three) times daily with meals. (Sliding scale): For diabetes management 03/09/18   Armandina StammerNwoko, Agnes I, NP  insulin glargine (LANTUS) 100 UNIT/ML injection Inject 0.3 mLs (30 Units total) into the skin 2 (two) times daily. 07/12/18   Ward, Layla MawKristen N, DO  insulin lispro (HUMALOG) 100 UNIT/ML injection Sliding scale insulin with meals Patient not taking: Reported on 07/22/2018 07/12/18   Ward, Layla MawKristen N, DO  traZODone (DESYREL) 50 MG tablet Take 100 mg by mouth at bedtime as needed for sleep.     [provider]                                                                                                                                    Past Surgical History Past Surgical History:  Procedure Laterality Date  . CIRCUMCISION  2000  . FINGER SURGERY Left 2001   "crushed pinky; had to repair it" (06/17/2013)   Family  History Family History  Problem Relation Age of Onset  . Asthma Mother   . COPD Mother   . Other Mother        possible autoimmune, unclear  . Diabetes Maternal Grandmother   . Heart disease Maternal Grandmother   . Hypertension Maternal Grandmother   . Mental illness Maternal Grandmother   . Heart disease Maternal Grandfather   . Hyperlipidemia Paternal Grandmother   . Hyperlipidemia Paternal Grandfather   . Migraines Sister        Hemiplegic Migraines     Social History Social History  Tobacco Use  . Smoking status: Passive Smoke Exposure - Never Smoker  . Smokeless tobacco: Never Used  . Tobacco comment: Mom and dad smoke outside   Substance Use Topics  . Alcohol use: No    Alcohol/week: 0.0 standard drinks  . Drug use: No   Allergies Bee venom; Fish allergy; and Shellfish allergy  Review of Systems Review of Systems All other systems are reviewed and are negative for acute change except as noted in the HPI  Physical Exam Vital Signs  I have reviewed the triage vital signs BP (!) 142/88 (BP Location: Right Arm)   Pulse (!) 110   Temp 97.8 F (36.6 C) (Oral)   SpO2 98%   Physical Exam Vitals signs reviewed.  Constitutional:      General: He is not in acute distress.    Appearance: He is well-developed. He is not diaphoretic.     Comments: Disheveled with multicolored hair   HENT:     Head: Normocephalic and atraumatic.     Nose: Nose normal.  Eyes:     General: No scleral icterus.       Right eye: No discharge.        Left eye: No discharge.     Conjunctiva/sclera: Conjunctivae normal.     Pupils: Pupils are equal, round, and reactive to light.  Neck:     Musculoskeletal: Normal range of motion and neck supple.  Cardiovascular:     Rate and Rhythm: Normal rate and regular rhythm.     Heart sounds: No murmur. No friction rub. No gallop.   Pulmonary:     Effort: Pulmonary effort is normal. No respiratory distress.     Breath sounds: Normal breath  sounds. No stridor. No rales.  Chest:     Chest wall: Tenderness present.    Abdominal:     General: There is no distension.     Palpations: Abdomen is soft.     Tenderness: There is no abdominal tenderness.  Musculoskeletal:        General: No tenderness.  Skin:    General: Skin is warm and dry.     Findings: No erythema or rash.  Neurological:     Mental Status: He is alert and oriented to person, place, and time.     ED Results and Treatments Labs (all labs ordered are listed, but only abnormal results are displayed) Labs Reviewed  BASIC METABOLIC PANEL - Abnormal; Notable for the following components:      Result Value   Sodium 125 (*)    Chloride 88 (*)    CO2 14 (*)    Glucose, Bld 711 (*)    Calcium 8.4 (*)    Anion gap 23 (*)    All other components within normal limits  URINALYSIS, ROUTINE W REFLEX MICROSCOPIC - Abnormal; Notable for the following components:   Color, Urine COLORLESS (*)    Glucose, UA >=500 (*)    Ketones, ur 80 (*)    All other components within normal limits  CBG MONITORING, ED - Abnormal; Notable for the following components:   Glucose-Capillary >600 (*)    All other components within normal limits  CBC  BLOOD GAS, VENOUS  RAPID URINE DRUG SCREEN, HOSP PERFORMED  ETHANOL  I-STAT TROPONIN, ED  EKG  EKG Interpretation  Date/Time:  Wednesday August 18 2018 00:09:57 EST Ventricular Rate:  101 PR Interval:    QRS Duration: 90 QT Interval:  329 QTC Calculation: 427 R Axis:   78 Text Interpretation:  Sinus tachycardia Nonspecific T abnormalities, inferior leads Borderline ST elevation, anterolateral leads No significant change since last tracing Confirmed by Drema Pry [phone]) on 08/18/2018 1:19:23 AM      Radiology No results found. Pertinent labs & imaging results that were available during my care of the  patient were reviewed by me and considered in my medical decision making (see chart for details).  Medications Ordered in ED Medications  insulin regular, human (MYXREDLIN) 100 units/ 100 mL infusion (has no administration in time range)  sodium chloride 0.9 % bolus 1,000 mL (has no administration in time range)  alum & mag hydroxide-simeth (MAALOX/MYLANTA) 200-200-20 MG/5ML suspension 30 mL (has no administration in time range)  lactated ringers infusion (has no administration in time range)  sodium chloride 0.9 % bolus 1,000 mL (has no administration in time range)  dextrose 5 %-0.45 % sodium chloride infusion (has no administration in time range)  potassium chloride 10 mEq in 100 mL IVPB (has no administration in time range)                                                                                                                                    Procedures Procedures CRITICAL CARE Performed by: Amadeo Garnet Summer Mccolgan Total critical care time: 35 minutes Critical care time was exclusive of separately billable procedures and treating other patients. Critical care was necessary to treat or prevent imminent or life-threatening deterioration. Critical care was time spent personally by me on the following activities: development of treatment plan with patient and/or surrogate as well as nursing, discussions with consultants, evaluation of patient's response to treatment, examination of patient, obtaining history from patient or surrogate, ordering and performing treatments and interventions, ordering and review of laboratory studies, ordering and review of radiographic studies, pulse oximetry and re-evaluation of patient's condition.   (including critical care time)  Medical Decision Making / ED Course I have reviewed the nursing notes for this encounter and the patient's prior records (if available in EHR or on provided paperwork).    1.  Hyperglycemia Work-up consistent with  diabetic ketoacidosis.  Patient started on IV fluids and insulin drip.  Will require readmission.  2.  Chest pain. Highly atypical for ACS.  EKG did show some ST segment elevation similar to prior EKGs.  He is already been ruled out for ACS.  His pain appears to be more musculoskeletal.    Final Clinical Impression(s) / ED Diagnoses Final diagnoses:  Diabetic ketoacidosis without coma associated with type 1 diabetes mellitus (HCC)  Chest wall pain      This chart was dictated using voice recognition software.  Despite best efforts to proofread,  errors can occur which can  change the documentation meaning.   Nira Connardama, Nyellie Yetter Eduardo, MD 08/18/18 [phone]

## 2018-08-18 NOTE — Progress Notes (Signed)
Pt complaining of hunger upon arrival to unit. Pt refuses anything to eat and states that he was not given a meal for dinner. CBG check was at 345. Pt educated about the need for insulin coverage, however, pt adamantly refuses, stating, "I won't eat anything until I leave here". On call provider notified via text/page. Will continue to monitor.

## 2018-08-18 NOTE — ED Notes (Addendum)
Report given to [facility] at 2w.

## 2018-08-19 DIAGNOSIS — R079 Chest pain, unspecified: Secondary | ICD-10-CM

## 2018-08-19 DIAGNOSIS — R0789 Other chest pain: Secondary | ICD-10-CM

## 2018-08-19 DIAGNOSIS — F329 Major depressive disorder, single episode, unspecified: Secondary | ICD-10-CM | POA: Diagnosis not present

## 2018-08-19 DIAGNOSIS — E1010 Type 1 diabetes mellitus with ketoacidosis without coma: Secondary | ICD-10-CM

## 2018-08-19 LAB — CBC
HCT: 44.8 % (ref 39.0–52.0)
Hemoglobin: 14.6 g/dL (ref 13.0–17.0)
MCH: 27.5 pg (ref 26.0–34.0)
MCHC: 32.6 g/dL (ref 30.0–36.0)
MCV: 84.5 fL (ref 80.0–100.0)
PLATELETS: 213 10*3/uL (ref 150–400)
RBC: 5.3 MIL/uL (ref 4.22–5.81)
RDW: 13.1 % (ref 11.5–15.5)
WBC: 8.6 10*3/uL (ref 4.0–10.5)
nRBC: 0 % (ref 0.0–0.2)

## 2018-08-19 LAB — BASIC METABOLIC PANEL
Anion gap: 9 (ref 5–15)
BUN: 12 mg/dL (ref 6–20)
CO2: 26 mmol/L (ref 22–32)
Calcium: 8.9 mg/dL (ref 8.9–10.3)
Chloride: 99 mmol/L (ref 98–111)
Creatinine, Ser: 0.66 mg/dL (ref 0.61–1.24)
GFR calc Af Amer: >60 mL/min (ref >60)
GFR calc non Af Amer: >60 mL/min (ref >60)
Glucose, Bld: 312 mg/dL — ABNORMAL HIGH (ref 70–99)
POTASSIUM: 4.2 mmol/L (ref 3.5–5.1)
Sodium: 134 mmol/L — ABNORMAL LOW (ref 135–145)

## 2018-08-19 LAB — GLUCOSE, CAPILLARY
GLUCOSE-CAPILLARY: 215 mg/dL — AB (ref 70–99)
Glucose-Capillary: 395 mg/dL — ABNORMAL HIGH (ref 70–99)
Glucose-Capillary: 320 mg/dL — ABNORMAL HIGH (ref 70–99)

## 2018-08-19 MED ORDER — IBUPROFEN 200 MG PO TABS
400.0000 mg | ORAL_TABLET | Freq: Four times a day (QID) | ORAL | Status: DC | PRN
  Administered 2018-08-19: 400 mg via ORAL
  Filled 2018-08-19: qty 2

## 2018-08-19 MED ORDER — INSULIN ASPART 100 UNIT/ML ~~LOC~~ SOLN: 8.0000 [IU] | Freq: Three times a day (TID) | SUBCUTANEOUS | Status: DC

## 2018-08-19 MED ORDER — INSULIN ASPART 100 UNIT/ML ~~LOC~~ SOLN
8.0000 [IU] | Freq: Three times a day (TID) | SUBCUTANEOUS | Status: DC
  Administered 2018-08-19 – 2018-08-20 (×4): 8 [IU] via SUBCUTANEOUS

## 2018-08-19 NOTE — Progress Notes (Signed)
PROGRESS NOTE  [name] [name]. IWL:[id number] DOB: [birth year] DOA: 08/18/2018 PCP: Patient, No Pcp Per  Brief History   18 year old man diabetes mellitus type 1, recently hospitalized for DKA, history of leaving AGAINST MEDICAL ADVICE, recently evaluated for chest pain, left AGAINST MEDICAL ADVICE, admitted for DKA.  A & P  DKA, diabetes mellitus type 1, hemoglobin A1c 11.5 earlier this month.  Known noncompliance. --Resolved with standard treatment  Diabetes mellitus type 1 with hyperglycemia, poor compliance with medical treatment, refused sliding scale insulin yesterday --Continue Lantus, add meal coverage, use sliding scale insulin  Atypical chest pain, musculoskeletal in nature.  EKG nonacute.  Troponin, d-dimer negative.  No signs or symptoms to suggest pericarditis.  Echocardiogram reassuring. --No further evaluation suggested  Depression.  No suicidal ideation or plan to harm self. --Psychiatry consultation placed   Improve hyperglycemia, psychiatry evaluation pending, anticipate discharge 2/21 if continues to improve.  DVT prophylaxis: enoxaparin Code Status: Full Family Communication: none Disposition Plan: home    Brendia Sacks, MD  [facility] Direct contact: see [email or web address]  7PM-7AM contact night coverage as above 08/19/2018, 5:05 PM  LOS: 0 days   Consultants  . Psychiatry   Procedures  .   Antibiotics  .   Interval History/Subjective  Feels okay, reports chest pain present for nearly a year, hurts with palpation of the left side of the chest.  No injury noted.  "I know it is not my heart".  Does feel somewhat depressed.  Has been noncompliant.  Refuse sliding scale insulin last night.  Denies suicidal ideation, homicidal ideation.  No plan to hurt self.  Objective   Vitals:  Vitals:   08/19/18 0456 08/19/18 1332  BP: 136/79 138/83  Pulse: 65 75  Resp: 10 18  Temp: 97.8 F (36.6 C) (!) 97.4 F (36.3 C)  SpO2: 98% 97%     Exam: Entire exam with RN Onalee Hua at bedside Constitutional:  . Appears calm and comfortable Eyes:  . pupils and irises appear normal ENMT:  . grossly normal hearing  . Lips appear normal Respiratory:  . CTA bilaterally, no w/r/r.  . Respiratory effort normal.  Cardiovascular:  . RRR, no m/r/g . Left chest wall is exquisitely tender to palpation.  Skin appears unremarkable, no significant rash.   . No LE extremity edema   Abdomen:  . Soft, nontender, nondistended Psychiatric:  . Mental status o Mood appears depressed, affect anxious  I have personally reviewed the following:   Today's Data  . CBG 300s . BUN and creatinine within normal limits.  Remainder BMP unremarkable.  Anion gap within normal limits.  CBC within normal limits.  Lab Data  . CRP, sed rate, d-dimer, urine drug screen negative  Micro Data  .   Imaging  . Chest x-ray independently reviewed, no acute disease  Cardiology Data  . Echo noted EKG independently reviewed showed sinus tachycardia with nonspecific ST changes inferiorly, compared to previous studies, no acute changes seen (07/14/2018, 04/14/2018)  Other Data  .   Scheduled Meds: . alum & mag hydroxide-simeth  30 mL Oral Once  . buprenorphine  8 mg Sublingual BID  . enoxaparin (LOVENOX) injection  40 mg Subcutaneous Q24H  . insulin aspart  0-15 Units Subcutaneous TID WC  . insulin aspart  0-5 Units Subcutaneous QHS  . insulin aspart  8 Units Subcutaneous TID WC  . insulin glargine  20 Units Subcutaneous BID  . multivitamin with minerals  1 tablet Oral Daily   Continuous Infusions:  Principal Problem:   DKA (diabetic ketoacidoses) (HCC) Active Problems:   Depression   Diabetic peripheral neuropathy associated with type 1 diabetes mellitus (HCC)   Atypical chest pain   LOS: 0 days

## 2018-08-20 DIAGNOSIS — R0789 Other chest pain: Secondary | ICD-10-CM | POA: Diagnosis not present

## 2018-08-20 DIAGNOSIS — E109 Type 1 diabetes mellitus without complications: Secondary | ICD-10-CM | POA: Diagnosis not present

## 2018-08-20 DIAGNOSIS — E1010 Type 1 diabetes mellitus with ketoacidosis without coma: Secondary | ICD-10-CM | POA: Diagnosis not present

## 2018-08-20 LAB — GLUCOSE, CAPILLARY
Glucose-Capillary: 253 mg/dL — ABNORMAL HIGH (ref 70–99)
Glucose-Capillary: 374 mg/dL — ABNORMAL HIGH (ref 70–99)
Glucose-Capillary: 146 mg/dL — ABNORMAL HIGH (ref 70–99)

## 2018-08-20 NOTE — Discharge Summary (Signed)
Physician Discharge Summary  [name] [name]. [id number] DOB: February 24, 2001 DOA: 08/18/2018  PCP: Patient, No Pcp Per  Managed by PSI Team per patient  Admit date: 08/18/2018 Discharge date: 08/20/2018  Recommendations for Outpatient Follow-up:  1. Follow-up diabetes, chronic medical issues including depression     Discharge Diagnoses: Principal diagnosis is #1 1. DKA, diabetes mellitus type 1 2. Diabetes mellitus type 1 with hyperglycemia 3. Atypical chest pain, musculoskeletal  4. Depression  Discharge Condition: improved Disposition: home  Diet recommendation: diabetic diet  Filed Weights   08/18/18 1430  Weight: 88.4 kg    History of present illness:  18 year old man diabetes mellitus type 1, recently hospitalized for DKA, history of leaving AGAINST MEDICAL ADVICE, recently evaluated for chest pain, left AGAINST MEDICAL ADVICE, admitted for DKA.  Hospital Course:  Patient was treated with standard therapy for DKA with rapid resolution.  Hyperglycemia improved with subcutaneous insulin.  Chest pain atypical, no further investigation suggested.  The patient reported depression 2/20 but never had any suicidal ideation or homicidal ideation.  On the day of discharge 2/21 he had no suicidal ideation or plan to harm self.  Mentation appears stable, no need for psychiatry consultation.  Depression appears stable.  Does not appear to be any risk to self.  DKA, diabetes mellitus type 1, hemoglobin A1c 11.5 earlier this month.  Known noncompliance. --Resolved with standard treatment  Diabetes mellitus type 1 with hyperglycemia, poor compliance with medical treatment, refused sliding scale insulin yesterday --Continue long-acting insulin on discharge, meal coverage on discharge  Atypical chest pain, musculoskeletal in nature.  EKG nonacute.  Troponin, d-dimer negative.  No signs or symptoms to suggest pericarditis.  Echocardiogram reassuring. --No further evaluation  suggested  Depression.  No suicidal ideation or plan to harm self. --Psychiatry consultation not needed  Consultants   None  Procedures   None  Today's assessment: S: Feels much better today.  Tolerating diet.  Ready to go home.  Feels like his chest pain is secondary to stress.  Denies suicidal ideation, no plans to hurt self. O: Vitals:  Vitals:   08/19/18 2127 08/20/18 0430  BP: (!) 142/86 123/77  Pulse: 75 61  Resp: 14 12  Temp: 98.1 F (36.7 C) (!) 97.5 F (36.4 C)  SpO2: 98% 97%    Constitutional:  . Appears calm and comfortable Respiratory:  . CTA bilaterally, no w/r/r.  . Respiratory effort normal.  Cardiovascular:  . RRR, no m/r/g Psychiatric:  . Mental status o Mood, affect appropriate  CBG stable  Discharge Instructions  Discharge Instructions    Activity as tolerated - No restrictions   Complete by:  As directed    Diet Carb Modified   Complete by:  As directed    Discharge instructions   Complete by:  As directed    Call your physician or seek immediate medical attention for high blood sugar over 400 or less than 70, vomiting, running out of medications or worsening of condition. Please see your doctor within one week to follow-up being in the hospital.     Allergies as of 08/20/2018      Reactions   Bee Venom Anaphylaxis   Fish Allergy Anaphylaxis   Shellfish Allergy Anaphylaxis   Tea Anaphylaxis      Medication List    TAKE these medications   albuterol 108 (90 Base) MCG/ACT inhaler Commonly known as:  PROVENTIL HFA;VENTOLIN HFA Inhale 1 puff into the lungs every 4 (four) hours as needed for wheezing or  shortness of breath.   glucose blood test strip Commonly known as:  ACCU-CHEK GUIDE Check glucose 6x daily: For blood sugar checks   hydrOXYzine 50 MG tablet Commonly known as:  ATARAX/VISTARIL Take 100 mg by mouth daily as needed for anxiety.   insulin aspart 100 UNIT/ML FlexPen Commonly known as:  NOVOLOG FLEXPEN Inject  0-15 Units into the skin 3 (three) times daily with meals. (Sliding scale): For diabetes management   insulin glargine 100 UNIT/ML injection Commonly known as:  LANTUS Inject 0.3 mLs (30 Units total) into the skin 2 (two) times daily.   SUBOXONE 8-2 MG Film Generic drug:  Buprenorphine HCl-Naloxone HCl Place 1 Film under the tongue 2 (two) times daily.   traZODone 50 MG tablet Commonly known as:  DESYREL Take 100 mg by mouth at bedtime as needed for sleep.      Allergies  Allergen Reactions  . Bee Venom Anaphylaxis  . Fish Allergy Anaphylaxis  . Shellfish Allergy Anaphylaxis  . Tea Anaphylaxis    The results of significant diagnostics from this hospitalization (including imaging, microbiology, ancillary and laboratory) are listed below for reference.    Significant Diagnostic Studies: Dg Chest 2 View  Result Date: 08/18/2018 CLINICAL DATA:  Mid chest pain for over 1 week. EXAM: CHEST - 2 VIEW COMPARISON:  08/09/2018 FINDINGS: The heart size and mediastinal contours are within normal limits. Both lungs are clear. The visualized skeletal structures are unremarkable. IMPRESSION: No active cardiopulmonary disease. Electronically Signed   By: Tollie Eth M.D.   On: 08/18/2018 01:35   Labs: Basic Metabolic Panel: Recent Labs  Lab 08/18/18 0001 08/18/18 0747 08/18/18 1711 08/19/18 0606  NA 125* 135 136 134*  K 4.4 3.3* 4.3 4.2  CL 88* 107 103 99  CO2 14* 20* 25 26  GLUCOSE 711* 190* 329* 312*  BUN 20 11 11 12   CREATININE 1.19 0.62 0.71 0.66  CALCIUM 8.4* 7.4* 8.7* 8.9   CBC: Recent Labs  Lab 08/18/18 0001 08/19/18 0606  WBC 8.4 8.6  HGB 15.4 14.6  HCT 46.3 44.8  MCV 83.3 84.5  PLT 247 213    CBG: Recent Labs  Lab 08/19/18 1231 08/19/18 1722 08/19/18 2127 08/20/18 0741 08/20/18 1138  GLUCAP 320* 215* 253* 374* 146*    Principal Problem:   DKA (diabetic ketoacidoses) (HCC) Active Problems:   Depression   Diabetic peripheral neuropathy associated with  type 1 diabetes mellitus (HCC)   Atypical chest pain   Time coordinating discharge: 25 minutes  Signed:  Brendia Sacks, MD  [facility]  08/20/2018, 7:03 PM

## 2018-08-20 NOTE — Progress Notes (Signed)
Patient is complaining of chest pain this morning. Says "it hurts man and I just ate". He also stated a few minutes later when he laid down, "then I had to lay down and it felt better." He is asking for nitroglycerin and saying that Ibuprofen doesn't help him at all.  Pt's blood sugar was 374 this morning but patient had milk and applesauce at bedside that he had had earlier. Patient wanted another milk but RN told him he could have water or a diet drink instead. He then went on to say "MAN! But I'm hungry!" RN then told patient that he had his breakfast tray there and it looked like he ate all of it, patient states that he gave some of his food to his friends and family in the room. RN noticed the next time in the patients room that he had another breakfast tray that he must've gotten from the cafeteria. Patient has asked 5 different staff members for milk even though RN told him he wasn't allowed.   Will continue to monitor patient and try to educate on better choices for food/beverages with a sugar that high.  Benson Norway, RN

## 2018-08-20 NOTE — Progress Notes (Signed)
Inpatient Diabetes Program Recommendations  AACE/ADA: New Consensus Statement on Inpatient Glycemic Control (2015)  Target Ranges:  Prepandial:   less than 140 mg/dL      Peak postprandial:   less than 180 mg/dL (1-2 hours)      Critically ill patients:  140 - 180 mg/dL   Lab Results  Component Value Date   GLUCAP 215 (H) 08/19/2018   HGBA1C 11.5 (H) 08/09/2018    Review of Glycemic Control  FBS and post-prandials elevated. Needs insulin adjustment.  Inpatient Diabetes Program Recommendations:     Increase Lantus to 25 units bid Increase Novolog to 12 units tidwc.  Continue to follow.  Thank you. Ailene Ards, RD, LDN, CDE Inpatient Diabetes Coordinator [phone]

## 2018-08-26 ENCOUNTER — Observation Stay (HOSPITAL_COMMUNITY)
Admission: EM | Admit: 2018-08-26 | Discharge: 2018-08-26 | Discharge status: Against Medical Advice | Payer: Medicaid Other | Attending: Internal Medicine | Admitting: Internal Medicine

## 2018-08-26 DIAGNOSIS — E1010 Type 1 diabetes mellitus with ketoacidosis without coma: Secondary | ICD-10-CM | POA: Diagnosis not present

## 2018-08-26 DIAGNOSIS — Z794 Long term (current) use of insulin: Secondary | ICD-10-CM | POA: Insufficient documentation

## 2018-08-26 DIAGNOSIS — E871 Hypo-osmolality and hyponatremia: Secondary | ICD-10-CM | POA: Diagnosis not present

## 2018-08-26 DIAGNOSIS — F419 Anxiety disorder, unspecified: Secondary | ICD-10-CM | POA: Insufficient documentation

## 2018-08-26 DIAGNOSIS — E1110 Type 2 diabetes mellitus with ketoacidosis without coma: Secondary | ICD-10-CM | POA: Diagnosis present

## 2018-08-26 DIAGNOSIS — Z79899 Other long term (current) drug therapy: Secondary | ICD-10-CM | POA: Insufficient documentation

## 2018-08-26 DIAGNOSIS — F1120 Opioid dependence, uncomplicated: Secondary | ICD-10-CM | POA: Diagnosis not present

## 2018-08-26 DIAGNOSIS — J45909 Unspecified asthma, uncomplicated: Secondary | ICD-10-CM | POA: Diagnosis not present

## 2018-08-26 DIAGNOSIS — Z590 Homelessness: Secondary | ICD-10-CM | POA: Insufficient documentation

## 2018-08-26 DIAGNOSIS — Z833 Family history of diabetes mellitus: Secondary | ICD-10-CM | POA: Insufficient documentation

## 2018-08-26 DIAGNOSIS — E1065 Type 1 diabetes mellitus with hyperglycemia: Secondary | ICD-10-CM | POA: Diagnosis present

## 2018-08-26 DIAGNOSIS — D573 Sickle-cell trait: Secondary | ICD-10-CM | POA: Insufficient documentation

## 2018-08-26 DIAGNOSIS — N179 Acute kidney failure, unspecified: Secondary | ICD-10-CM | POA: Diagnosis not present

## 2018-08-26 LAB — POCT I-STAT EG7
Acid-base deficit: 16 mmol/L — ABNORMAL HIGH (ref 0.0–2.0)
Bicarbonate: 10 mmol/L — ABNORMAL LOW (ref 20.0–28.0)
Calcium, Ion: 1.09 mmol/L — ABNORMAL LOW (ref 1.15–1.40)
HCT: 43 % (ref 39.0–52.0)
Hemoglobin: 14.6 g/dL (ref 13.0–17.0)
O2 Saturation: 94 %
Potassium: 4.2 mmol/L (ref 3.5–5.1)
Sodium: 129 mmol/L — ABNORMAL LOW (ref 135–145)
TCO2: 11 mmol/L — AB (ref 22–32)
pCO2, Ven: 25.5 mmHg — ABNORMAL LOW (ref 44.0–60.0)
pH, Ven: 7.2 — ABNORMAL LOW (ref 7.250–7.430)
pO2, Ven: 82 mmHg — ABNORMAL HIGH (ref 32.0–45.0)

## 2018-08-26 LAB — CBC WITH DIFFERENTIAL/PLATELET
Abs Immature Granulocytes: 0.29 10*3/uL — ABNORMAL HIGH (ref 0.00–0.07)
Basophils Absolute: 0.1 10*3/uL (ref 0.0–0.1)
Basophils Relative: 1 %
Eosinophils Absolute: 0.1 10*3/uL (ref 0.0–0.5)
Eosinophils Relative: 1 %
HCT: 44.8 % (ref 39.0–52.0)
Hemoglobin: 14.7 g/dL (ref 13.0–17.0)
Immature Granulocytes: 5 %
Lymphocytes Relative: 19 %
Lymphs Abs: 1.2 10*3/uL (ref 0.7–4.0)
MCH: 28.1 pg (ref 26.0–34.0)
MCHC: 32.8 g/dL (ref 30.0–36.0)
MCV: 85.7 fL (ref 80.0–100.0)
Monocytes Absolute: 0.4 10*3/uL (ref 0.1–1.0)
Monocytes Relative: 6 %
Neutro Abs: 4.3 10*3/uL (ref 1.7–7.7)
Neutrophils Relative %: 68 %
Platelets: 297 10*3/uL (ref 150–400)
RBC: 5.23 MIL/uL (ref 4.22–5.81)
RDW: 13.2 % (ref 11.5–15.5)
WBC: 6.3 10*3/uL (ref 4.0–10.5)
nRBC: 0 % (ref 0.0–0.2)

## 2018-08-26 LAB — COMPREHENSIVE METABOLIC PANEL
ALT: 47 U/L — ABNORMAL HIGH (ref 0–44)
AST: 15 U/L (ref 15–41)
Albumin: 3.6 g/dL (ref 3.5–5.0)
Alkaline Phosphatase: 93 U/L (ref 38–126)
Anion gap: 23 — ABNORMAL HIGH (ref 5–15)
BUN: 11 mg/dL (ref 6–20)
CO2: 11 mmol/L — ABNORMAL LOW (ref 22–32)
Calcium: 8.3 mg/dL — ABNORMAL LOW (ref 8.9–10.3)
Chloride: 95 mmol/L — ABNORMAL LOW (ref 98–111)
Creatinine, Ser: 1.44 mg/dL — ABNORMAL HIGH (ref 0.61–1.24)
GFR calc Af Amer: >60 mL/min (ref >60)
GFR calc non Af Amer: >60 mL/min (ref >60)
Glucose, Bld: 501 mg/dL (ref 70–99)
Potassium: 4.1 mmol/L (ref 3.5–5.1)
Sodium: 129 mmol/L — ABNORMAL LOW (ref 135–145)
Total Bilirubin: 1.5 mg/dL — ABNORMAL HIGH (ref 0.3–1.2)
Total Protein: 6.4 g/dL — ABNORMAL LOW (ref 6.5–8.1)

## 2018-08-26 LAB — CBG MONITORING, ED
Glucose-Capillary: 423 mg/dL — ABNORMAL HIGH (ref 70–99)
Glucose-Capillary: 458 mg/dL — ABNORMAL HIGH (ref 70–99)

## 2018-08-26 MED ORDER — INSULIN REGULAR(HUMAN) IN NACL 100-0.9 UT/100ML-% IV SOLN
INTRAVENOUS | Status: DC
  Administered 2018-08-26: 4 [IU]/h via INTRAVENOUS
  Filled 2018-08-26: qty 100

## 2018-08-26 MED ORDER — DEXTROSE-NACL 5-0.45 % IV SOLN: INTRAVENOUS | Status: DC

## 2018-08-26 MED ORDER — SODIUM CHLORIDE 0.9 % IV BOLUS
2000.0000 mL | Freq: Once | INTRAVENOUS | Status: AC
  Administered 2018-08-26: 2000 mL via INTRAVENOUS

## 2018-08-26 NOTE — ED Notes (Signed)
This RN went in room to recheck CBG and pt had taken his IV out IV infusing in floor along with NS patient was getting dressed and states he was going home. Pt has been told by this RN and MD that he is at risk for permanent disability up to death if he were to leave. Pt states he is going home to get something for his house arrest band and then coming back to ED. Unable to convince pt to stay in ED to receive emergency treatment for his elevated blood sugar.

## 2018-08-26 NOTE — ED Triage Notes (Signed)
Pt states he checked his sugar and it read high states has been taking his meds rt flank pain

## 2018-08-26 NOTE — Progress Notes (Signed)
Inpatient Diabetes Program Recommendations  AACE/ADA: New Consensus Statement on Inpatient Glycemic Control (2015)  Target Ranges:  Prepandial:   less than 140 mg/dL      Peak postprandial:   less than 180 mg/dL (1-2 hours)      Critically ill patients:  140 - 180 mg/dL   Lab Results  Component Value Date   GLUCAP 458 (H) 08/26/2018   HGBA1C 11.5 (H) 08/09/2018    Review of Glycemic Control  Spoke with pt on phone after leaving AMA. Encouraged him to come back to hospital for treatment of DKA. Pt states he got off bus, felt dizzy, fell, and hit head on parking meter. Also stated no one would tell him what his blood sugar was. Explained to patient that he's at risk for permanent disability and death if he chooses not to return for medical treatment.  Thank you. Ailene Ards, RD, LDN, CDE Inpatient Diabetes Coordinator [phone]

## 2018-08-26 NOTE — H&P (Signed)
HISTORY AND PHYSICAL       PATIENT DETAILS Name: [name] [name]. Age: 18 y.o. Sex: male Date of Birth: Jul 20, 2000 Admit Date: 08/26/2018 PTW:[id number], No Pcp Per   Patient coming from: Home   CHIEF COMPLAINT:  Hyperglycemia  HPI: [name] [name]. is a 18 y.o. male with medical history significant of IDDM, history of leaving AGAINST MEDICAL ADVICE-just discharged from this hospital a few days back after he was admitted and treated for DKA-presents with hyperglycemia.  Patient is very uncooperative-he refuses to answer most of my questions-and refuses to be cooperative with my exam.  Apparently he presented to the hospital somewhat confused-and was found to have hypoglycemia, further evaluation revealed DKA.  Patient was started on IV insulin, IV fluids-in the [facility] was asked to admit this patient for further evaluation and treatment.  Patient threatening to leave AGAINST MEDICAL ADVICE during my exam/interview-asking for a bus pass/taxi voucher so that he can go home.  Unable to obtain any further history due to lack of any cooperation.  ED Course:  Found to have probable DKA-given IV fluids, IV insulin and TR is asked to admit.   REVIEW OF SYSTEMS:  Unable to obtain as patient uncooperative  ALLERGIES:   Allergies  Allergen Reactions  . Bee Venom Anaphylaxis  . Fish Allergy Anaphylaxis  . Shellfish Allergy Anaphylaxis  . Tea Anaphylaxis    PAST MEDICAL HISTORY: Past Medical History:  Diagnosis Date  . ADD (attention deficit disorder)   . Allergy   . Anxiety   . Asthma   . Depression   . Epileptic seizure (HCC)    "both petit and grand mal; last sz ~ 2 wk ago" (06/17/2013)  . Headache(784.0)    "2-3 times/wk usually" (06/17/2013)  . Heart murmur    "heard a slight one earlier today" (06/17/2013)  . Homeless   . Migraine    "maybe once/month; it's severe" (06/17/2013)  . ODD (oppositional defiant disorder)   . Sickle cell  trait (HCC)   . Type I diabetes mellitus (HCC)    "that's what they're thinking now" (06/17/2013)  . Vision abnormalities    "takes him longer to focus cause; from his sz" (06/17/2013)    PAST SURGICAL HISTORY: Past Surgical History:  Procedure Laterality Date  . CIRCUMCISION  2000  . FINGER SURGERY Left 2001   "crushed pinky; had to repair it" (06/17/2013)    MEDICATIONS AT HOME: Prior to Admission medications   Medication Sig Start Date End Date Taking? Authorizing Provider  albuterol (PROVENTIL HFA;VENTOLIN HFA) 108 (90 Base) MCG/ACT inhaler Inhale 1 puff into the lungs every 4 (four) hours as needed for wheezing or shortness of breath.    [provider]  glucose blood (ACCU-CHEK GUIDE) test strip Check glucose 6x daily: For blood sugar checks 03/22/18   Long, Arlyss Repress, MD  hydrOXYzine (ATARAX/VISTARIL) 50 MG tablet Take 100 mg by mouth daily as needed for anxiety.     [provider]  insulin aspart (NOVOLOG FLEXPEN) 100 UNIT/ML FlexPen Inject 0-15 Units into the skin 3 (three) times daily with meals. (Sliding scale): For diabetes management 03/09/18   Armandina Stammer I, NP  insulin glargine (LANTUS) 100 UNIT/ML injection Inject 0.3 mLs (30 Units total) into the skin 2 (two) times daily. 07/12/18   Ward, Kristen N, DO  SUBOXONE 8-2 MG FILM Place 1 Film under the tongue 2 (two) times daily. 08/11/18   [provider]  traZODone (DESYREL) 50 MG tablet Take 100 mg by mouth at bedtime as needed for sleep.     [provider]    FAMILY HISTORY: Family History  Problem Relation Age of Onset  . Asthma Mother   . COPD Mother   . Other Mother        possible autoimmune, unclear  . Diabetes Maternal Grandmother   . Heart disease Maternal Grandmother   . Hypertension Maternal Grandmother   . Mental illness Maternal Grandmother   . Heart disease Maternal Grandfather   . Hyperlipidemia Paternal Grandmother   . Hyperlipidemia Paternal Grandfather   .  Migraines Sister        Hemiplegic Migraines      SOCIAL HISTORY:  reports that he is a non-smoker but has been exposed to tobacco smoke. He has never used smokeless tobacco. He reports that he does not drink alcohol or use drugs.  PHYSICAL EXAM: Blood pressure 120/63, pulse 90, temperature 97.7 F (36.5 C), temperature source Oral, resp. rate 19, SpO2 100 %.  General appearance :Awake, alert, not in any distress.  Eyes:.Pink conjunctiva HEENT: Atraumatic and Normocephalic Resp:Good air entry bilaterally, no added sounds heard anteriorly CVS: S1 S2 regular, no murmurs.  GI: Bowel sounds present, Non tender and not distended with no gaurding, rigidity or rebound. Extremities: B/L Lower Ext shows no edema, both legs are warm to touch Neurology: Appears to be moving all 4 extremities  LABS ON ADMISSION:  I have personally reviewed following labs and imaging studies  CBC: Recent Labs  Lab 08/26/18 1229 08/26/18 1255  WBC 6.3  --   NEUTROABS 4.3  --   HGB 14.7 14.6  HCT 44.8 43.0  MCV 85.7  --   PLT 297  --     Basic Metabolic Panel: Recent Labs  Lab 08/26/18 1229 08/26/18 1255  NA 129* 129*  K 4.1 4.2  CL 95*  --   CO2 11*  --   GLUCOSE 501*  --   BUN 11  --   CREATININE 1.44*  --   CALCIUM 8.3*  --     GFR: Estimated Creatinine Clearance: 86.6 mL/min (A) (by C-G formula based on SCr of 1.44 mg/dL (H)).  Liver Function Tests: Recent Labs  Lab 08/26/18 1229  AST 15  ALT 47*  ALKPHOS 93  BILITOT 1.5*  PROT 6.4*  ALBUMIN 3.6   No results for input(s): LIPASE, AMYLASE in the last 168 hours. No results for input(s): AMMONIA in the last 168 hours.  Coagulation Profile: No results for input(s): INR, PROTIME in the last 168 hours.  Cardiac Enzymes: No results for input(s): CKTOTAL, CKMB, CKMBINDEX, TROPONINI in the last 168 hours.  BNP (last 3 results) No results for input(s): PROBNP in the last 8760 hours.  HbA1C: No results for input(s): HGBA1C in  the last 72 hours.  CBG: Recent Labs  Lab 08/19/18 2127 08/20/18 0741 08/20/18 1138 08/26/18 1153 08/26/18 1332  GLUCAP 253* 374* 146* 423* 458*    Lipid Profile: No results for input(s): CHOL, HDL, LDLCALC, TRIG, CHOLHDL, LDLDIRECT in the last 72 hours.  Thyroid Function Tests: No results for input(s): TSH, T4TOTAL, FREET4, T3FREE, THYROIDAB in the last 72 hours.  Anemia Panel: No results for input(s): VITAMINB12, FOLATE, FERRITIN, TIBC, IRON, RETICCTPCT in the last 72 hours.  Urine analysis:    Component Value Date/Time   COLORURINE COLORLESS (A) 08/18/2018 0105   APPEARANCEUR CLEAR 08/18/2018 0105   LABSPEC 1.020 08/18/2018 0105   PHURINE  5.0 08/18/2018 0105   GLUCOSEU >=500 (A) 08/18/2018 0105   HGBUR NEGATIVE 08/18/2018 0105   BILIRUBINUR NEGATIVE 08/18/2018 0105   KETONESUR 80 (A) 08/18/2018 0105   PROTEINUR NEGATIVE 08/18/2018 0105   UROBILINOGEN 0.2 03/13/2015 0123   NITRITE NEGATIVE 08/18/2018 0105   LEUKOCYTESUR NEGATIVE 08/18/2018 0105    Sepsis Labs: Lactic Acid, Venous    Component Value Date/Time   LATICACIDVEN 1.76 03/21/2018 1512     Microbiology: No results found for this or any previous visit (from the past 240 hour(s)).    RADIOLOGIC STUDIES ON ADMISSION: No results found.   EKG:  Not performed  ASSESSMENT AND PLAN: DKA: High suspicion for noncompliance-continue IV insulin, IV fluids-once anion gap closes-we [name] transition him to subcutaneous insulin.  Last A1c on 2/10 was 11.5.  AKI: Likely provoked by DKA-hemodynamically mediated-should improve with treatment of hyperglycemia.  Hyponatremia: Likely pseudohyponatremia in the setting of uncontrolled hyperglycemia-should improve after normalization of blood sugar levels.  ?  Opiate dependence: Appears to be on Subutex-reviewed prior H&P-has had prescriptions for it-resume Subutex.  Depression: Unclear to me whether or not patient actually is on antidepressants-he is very  uncooperative.  [name] reassess tomorrow with  Note-patient is awake-alert during my interview-and is repeatedly threatening to leave AGAINST MEDICAL ADVICE.  He is asking for a bus pass/taxi voucher.  Nursing staff has already informed the patient that although he has a ankle bracelet for monitoring-law enforcement has already been made aware that he [name] likely be admitted in the hospital.  This MD has clearly outlined significant life-threatening and life disabling risk of leaving AMA in the setting of DKA.  Further plan [name] depend as patient's clinical course evolves and further radiologic and laboratory data become available. Patient [name] be monitored closely.   CONSULTS: None  DVT Prophylaxis: Prophylactic Lovenox   Code Status: Full Code  Disposition Plan:  Discharge back home possibly in 1-2 days, depending on clinical course  Admission status: Observation going to SDU  Total time spent  45 minutes.Greater than 50% of this time was spent in counseling, explanation of diagnosis, planning of further management, and coordination of care.  Jeoffrey Massed [facility] Pager [phone]  If 7PM-7AM, please contact night-coverage  Please page via [email or web address]  Go to [email or web address] and use [place]'s universal password to access. If you do not have the password, please contact the hospital operator.  Locate the [facility] provider you are looking for under [facility] and page to a number that you can be directly reached. If you still have difficulty reaching the provider, please page the [facility] (Director on Call) for the Hospitalists listed on amion for assistance.  08/26/2018, 2:38 PM

## 2018-08-26 NOTE — ED Notes (Signed)
Pt upset due to having a house arrest band on his ankle and "needs to get home or he will get arrest" Charge RN asked [employer] to come in room so patient would not get in arrest for being admitted to hospital. Pt still repeating he wants to go home.

## 2018-08-26 NOTE — ED Notes (Signed)
Admitting physician in room to talk to pt about need to stay in the hospital pt still stating he is not staying over night.

## 2018-08-27 NOTE — ED Provider Notes (Signed)
[facility] EMERGENCY DEPARTMENT Provider Note   CSN: [id number] Arrival date & time: 08/26/18  1137    History   Chief Complaint Chief Complaint  Patient presents with  . Hyperglycemia    HPI [name] [name]. is a 18 y.o. male.     HPI Patient presents to the emergency department with elevated glucose and lethargy.  Patient will not talk to me when I go into the room.  Asking questions and he ignores me.  Patient was answering questions for the nurse prior to my arrival in the room.  Patient would open his eyes as well.He would close his eyes when I asked questions Past Medical History:  Diagnosis Date  . ADD (attention deficit disorder)   . Allergy   . Anxiety   . Asthma   . Depression   . Epileptic seizure (HCC)    "both petit and grand mal; last sz ~ 2 wk ago" (06/17/2013)  . Headache(784.0)    "2-3 times/wk usually" (06/17/2013)  . Heart murmur    "heard a slight one earlier today" (06/17/2013)  . Homeless   . Migraine    "maybe once/month; it's severe" (06/17/2013)  . ODD (oppositional defiant disorder)   . Sickle cell trait (HCC)   . Type I diabetes mellitus (HCC)    "that's what they're thinking now" (06/17/2013)  . Vision abnormalities    "takes him longer to focus cause; from his sz" (06/17/2013)    Patient Active Problem List   Diagnosis Date Noted  . Atypical chest pain 08/19/2018  . Borderline personality disorder (HCC) 02/26/2018  . 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 DKA, type 1 (HCC) 07/16/2017  . Nausea & vomiting 07/16/2017  . Uncontrolled type 1 diabetes circulatory disorder erectile dysfunction (HCC)   . DKA (diabetic ketoacidoses) (HCC) 12/18/2016  . History of seizures 12/01/2016  . History of migraine 12/01/2016  . Disordered eating 03/08/2015  . Ketonuria   . Adjustment  reaction to medical therapy   . Non compliance w medication regimen   . 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 Dehydration 08/22/2014  . Hyperglycemia due to type 1 diabetes mellitus (HCC) 08/21/2014  . Type 1 diabetes mellitus with hyperglycemia (HCC)   . Noncompliance   . Generalized abdominal pain   . Glycosuria   . Depression 07/12/2014  . Involuntary movements 06/13/2014  . Bilateral leg pain 06/13/2014  . 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 Suicidal ideation 03/31/2014  . ODD (oppositional defiant disorder) 03/03/2014  . MDD (major depressive disorder), recurrent episode, moderate (HCC) 02/16/2014  . Attention deficit hyperactivity disorder (ADHD), combined type, moderate 02/16/2014  . Microcytic anemia 02/15/2014  . Hyperglycemia 02/14/2014  . Goiter 02/04/2014  . 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 Acquired acanthosis nigricans 02/04/2014  . Obesity, morbid (HCC) 02/04/2014  . Insulin resistance 02/04/2014  . Hyperinsulinemia 02/04/2014  . Hypoglycemia associated with diabetes (HCC) 02/02/2014  . Maladaptive health behaviors affecting medical condition 02/02/2014  . Hypoglycemia 02/02/2014  . Type 1 diabetes mellitus in patient 30 to 20 years of age with hemoglobin A1c goal of less than 7.5% (HCC) 01/26/2014  . 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  . 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 Migraine without aura 12/13/2013  . Body  mass index, pediatric, greater than or equal to 95th percentile for age 91/16/2015  . Asthma 12/13/2013  . Hypoglycemia unawareness in type 1 diabetes mellitus (HCC) 08/08/2013  . Seizure disorder (HCC) 07/06/2013  . Short-term memory loss 07/06/2013  . Sickle cell trait (HCC) 07/06/2013  . Diabetes (HCC) 06/17/2013  . Diabetes mellitus, new onset  (HCC) 06/17/2013    Past Surgical History:  Procedure Laterality Date  . CIRCUMCISION  2000  . FINGER SURGERY Left 2001   "crushed pinky; had to repair it" (06/17/2013)        Home Medications    Prior to Admission medications   Medication Sig Start Date End Date Taking? Authorizing Provider  albuterol (PROVENTIL HFA;VENTOLIN HFA) 108 (90 Base) MCG/ACT inhaler Inhale 1 puff into the lungs every 4 (four) hours as needed for wheezing or shortness of breath.    [provider]  glucose blood (ACCU-CHEK GUIDE) test strip Check glucose 6x daily: For blood sugar checks 03/22/18   Long, Arlyss Repress, MD  hydrOXYzine (ATARAX/VISTARIL) 50 MG tablet Take 100 mg by mouth daily as needed for anxiety.     [provider]  insulin aspart (NOVOLOG FLEXPEN) 100 UNIT/ML FlexPen Inject 0-15 Units into the skin 3 (three) times daily with meals. (Sliding scale): For diabetes management 03/09/18   Armandina Stammer I, NP  insulin glargine (LANTUS) 100 UNIT/ML injection Inject 0.3 mLs (30 Units total) into the skin 2 (two) times daily. 07/12/18   Ward, Kristen N, DO  SUBOXONE 8-2 MG FILM Place 1 Film under the tongue 2 (two) times daily. 08/11/18   [provider]  traZODone (DESYREL) 50 MG tablet Take 100 mg by mouth at bedtime as needed for sleep.     [provider]    Family History Family History  Problem Relation Age of Onset  . Asthma Mother   . COPD Mother   . Other Mother        possible autoimmune, unclear  . Diabetes Maternal Grandmother   . Heart disease Maternal Grandmother   . Hypertension Maternal Grandmother   . Mental illness Maternal Grandmother   . Heart disease Maternal Grandfather   . Hyperlipidemia Paternal Grandmother   . Hyperlipidemia Paternal Grandfather   . Migraines Sister        Hemiplegic Migraines     Social History Social History   Tobacco Use  . Smoking status: Passive Smoke Exposure - Never Smoker  . Smokeless tobacco: Never Used  .  Tobacco comment: Mom and dad smoke outside   Substance Use Topics  . Alcohol use: No    Alcohol/week: 0.0 standard drinks  . Drug use: No     Allergies   Bee venom; Fish allergy; Shellfish allergy; and Tea   Review of Systems Review of Systems Level 5 caveat applies due to uncooperativeness  Physical Exam Updated Vital Signs BP 120/63   Pulse 90   Temp 97.7 F (36.5 C) (Oral)   Resp 19   SpO2 100%   Physical Exam Vitals signs and nursing note reviewed.  Constitutional:      General: He is not in acute distress.    Appearance: He is well-developed.  HENT:     Head: Normocephalic and atraumatic.  Eyes:     Pupils: Pupils are equal, round, and reactive to light.  Neck:     Musculoskeletal: Normal range of motion and neck supple.  Cardiovascular:     Rate and Rhythm: Normal rate and regular rhythm.  Heart sounds: Normal heart sounds. No murmur. No friction rub. No gallop.   Pulmonary:     Effort: Pulmonary effort is normal. No respiratory distress.     Breath sounds: Normal breath sounds. No wheezing.  Skin:    General: Skin is warm and dry.     Findings: No erythema or rash.  Neurological:     Mental Status: He is alert.     Motor: No abnormal muscle tone.  Psychiatric:        Behavior: Behavior normal.      ED Treatments / Results  Labs (all labs ordered are listed, but only abnormal results are displayed) Labs Reviewed  COMPREHENSIVE METABOLIC PANEL - Abnormal; Notable for the following components:      Result Value   Sodium 129 (*)    Chloride 95 (*)    CO2 11 (*)    Glucose, Bld 501 (*)    Creatinine, Ser 1.44 (*)    Calcium 8.3 (*)    Total Protein 6.4 (*)    ALT 47 (*)    Total Bilirubin 1.5 (*)    Anion gap 23 (*)    All other components within normal limits  CBC WITH DIFFERENTIAL/PLATELET - Abnormal; Notable for the following components:   Abs Immature Granulocytes 0.29 (*)    All other components within normal limits  CBG MONITORING,  ED - Abnormal; Notable for the following components:   Glucose-Capillary 423 (*)    All other components within normal limits  POCT I-STAT EG7 - Abnormal; Notable for the following components:   pH, Ven 7.200 (*)    pCO2, Ven 25.5 (*)    pO2, Ven 82.0 (*)    Bicarbonate 10.0 (*)    TCO2 11 (*)    Acid-base deficit 16.0 (*)    Sodium 129 (*)    Calcium, Ion 1.09 (*)    All other components within normal limits  CBG MONITORING, ED - Abnormal; Notable for the following components:   Glucose-Capillary 458 (*)    All other components within normal limits  I-STAT VENOUS BLOOD GAS, ED    EKG None  Radiology No results found.  Procedures Procedures (including critical care time)  Medications Ordered in ED Medications  sodium chloride 0.9 % bolus 2,000 mL (0 mLs Intravenous Stopped 08/26/18 1430)     Initial Impression / Assessment and Plan / ED Course  I have reviewed the triage vital signs and the nursing notes.  Pertinent labs & imaging results that were available during my care of the patient were reviewed by me and considered in my medical decision making (see chart for details).        I felt that the patient needs to be admitted due to DKA.  ED glucose stabilizer was started.  I spoke with the admitting doctors about admission for the patient.   CRITICAL CARE Performed by: Jamesetta Orleans Nissa Stannard Total critical care time: 30 minutes Critical care time was exclusive of separately billable procedures and treating other patients. Critical care was necessary to treat or prevent imminent or life-threatening deterioration. Critical care was time spent personally by me on the following activities: development of treatment plan with patient and/or surrogate as well as nursing, discussions with consultants, evaluation of patient's response to treatment, examination of patient, obtaining history from patient or surrogate, ordering and performing treatments and interventions,  ordering and review of laboratory studies, ordering and review of radiographic studies, pulse oximetry and re-evaluation of patient's condition.  Final Clinical Impressions(s) / ED Diagnoses   Final diagnoses:  None    ED Discharge Orders    None       Charlestine Night, PA-C 08/27/18 1541    Tilden Fossa, MD 08/28/18 [phone]

## 2018-08-29 ENCOUNTER — Emergency Department (HOSPITAL_COMMUNITY): Payer: Medicaid Other

## 2018-08-29 ENCOUNTER — Inpatient Hospital Stay (HOSPITAL_COMMUNITY)
Admission: EM | Admit: 2018-08-29 | Discharge: 2018-08-30 | DRG: 638 | Discharge status: Against Medical Advice | Payer: Medicaid Other | Attending: Internal Medicine | Admitting: Internal Medicine

## 2018-08-29 ENCOUNTER — Encounter (HOSPITAL_COMMUNITY): Payer: Self-pay

## 2018-08-29 ENCOUNTER — Other Ambulatory Visit: Payer: Self-pay

## 2018-08-29 DIAGNOSIS — E1010 Type 1 diabetes mellitus with ketoacidosis without coma: Secondary | ICD-10-CM | POA: Diagnosis present

## 2018-08-29 DIAGNOSIS — F332 Major depressive disorder, recurrent severe without psychotic features: Secondary | ICD-10-CM | POA: Diagnosis present

## 2018-08-29 DIAGNOSIS — F902 Attention-deficit hyperactivity disorder, combined type: Secondary | ICD-10-CM | POA: Diagnosis present

## 2018-08-29 DIAGNOSIS — Z794 Long term (current) use of insulin: Secondary | ICD-10-CM

## 2018-08-29 DIAGNOSIS — T383X6A Underdosing of insulin and oral hypoglycemic [antidiabetic] drugs, initial encounter: Secondary | ICD-10-CM | POA: Diagnosis present

## 2018-08-29 DIAGNOSIS — Z87892 Personal history of anaphylaxis: Secondary | ICD-10-CM | POA: Diagnosis not present

## 2018-08-29 DIAGNOSIS — Z91018 Allergy to other foods: Secondary | ICD-10-CM | POA: Diagnosis not present

## 2018-08-29 DIAGNOSIS — Z825 Family history of asthma and other chronic lower respiratory diseases: Secondary | ICD-10-CM

## 2018-08-29 DIAGNOSIS — Z9114 Patient's other noncompliance with medication regimen: Secondary | ICD-10-CM | POA: Diagnosis not present

## 2018-08-29 DIAGNOSIS — F329 Major depressive disorder, single episode, unspecified: Secondary | ICD-10-CM | POA: Diagnosis present

## 2018-08-29 DIAGNOSIS — Z5329 Procedure and treatment not carried out because of patient's decision for other reasons: Secondary | ICD-10-CM | POA: Diagnosis present

## 2018-08-29 DIAGNOSIS — Z833 Family history of diabetes mellitus: Secondary | ICD-10-CM | POA: Diagnosis not present

## 2018-08-29 DIAGNOSIS — E860 Dehydration: Secondary | ICD-10-CM | POA: Diagnosis present

## 2018-08-29 DIAGNOSIS — E1110 Type 2 diabetes mellitus with ketoacidosis without coma: Secondary | ICD-10-CM | POA: Diagnosis present

## 2018-08-29 DIAGNOSIS — J45909 Unspecified asthma, uncomplicated: Secondary | ICD-10-CM | POA: Diagnosis present

## 2018-08-29 DIAGNOSIS — D573 Sickle-cell trait: Secondary | ICD-10-CM | POA: Diagnosis present

## 2018-08-29 DIAGNOSIS — F1120 Opioid dependence, uncomplicated: Secondary | ICD-10-CM | POA: Diagnosis present

## 2018-08-29 DIAGNOSIS — Y92009 Unspecified place in unspecified non-institutional (private) residence as the place of occurrence of the external cause: Secondary | ICD-10-CM

## 2018-08-29 DIAGNOSIS — Z91120 Patient's intentional underdosing of medication regimen due to financial hardship: Secondary | ICD-10-CM

## 2018-08-29 DIAGNOSIS — M79601 Pain in right arm: Secondary | ICD-10-CM | POA: Diagnosis present

## 2018-08-29 DIAGNOSIS — D509 Iron deficiency anemia, unspecified: Secondary | ICD-10-CM | POA: Diagnosis present

## 2018-08-29 DIAGNOSIS — Z79899 Other long term (current) drug therapy: Secondary | ICD-10-CM

## 2018-08-29 DIAGNOSIS — Z91013 Allergy to seafood: Secondary | ICD-10-CM | POA: Diagnosis not present

## 2018-08-29 DIAGNOSIS — E109 Type 1 diabetes mellitus without complications: Secondary | ICD-10-CM | POA: Diagnosis present

## 2018-08-29 DIAGNOSIS — Z91030 Bee allergy status: Secondary | ICD-10-CM | POA: Diagnosis not present

## 2018-08-29 DIAGNOSIS — Z818 Family history of other mental and behavioral disorders: Secondary | ICD-10-CM | POA: Diagnosis not present

## 2018-08-29 DIAGNOSIS — F419 Anxiety disorder, unspecified: Secondary | ICD-10-CM | POA: Diagnosis present

## 2018-08-29 LAB — BLOOD GAS, ARTERIAL
Drawn by: 103701
FIO2: 21
O2 Saturation: 97.9 %
Patient temperature: 98.6
pH, Arterial: 7.197 — CL (ref 7.350–7.450)
pO2, Arterial: 124 mmHg — ABNORMAL HIGH (ref 83.0–108.0)

## 2018-08-29 LAB — CBC WITH DIFFERENTIAL/PLATELET
Abs Immature Granulocytes: 0.3 10*3/uL — ABNORMAL HIGH (ref 0.00–0.07)
Basophils Absolute: 0.1 10*3/uL (ref 0.0–0.1)
Basophils Relative: 1 %
EOS PCT: 0 %
Eosinophils Absolute: 0 10*3/uL (ref 0.0–0.5)
HCT: 39.6 % (ref 39.0–52.0)
HEMOGLOBIN: 13 g/dL (ref 13.0–17.0)
Immature Granulocytes: 4 %
Lymphocytes Relative: 15 %
Lymphs Abs: 1.2 10*3/uL (ref 0.7–4.0)
MCH: 29.1 pg (ref 26.0–34.0)
MCHC: 32.8 g/dL (ref 30.0–36.0)
MCV: 88.8 fL (ref 80.0–100.0)
Monocytes Absolute: 0.5 10*3/uL (ref 0.1–1.0)
Monocytes Relative: 7 %
NRBC: 0 % (ref 0.0–0.2)
Neutro Abs: 5.9 10*3/uL (ref 1.7–7.7)
Neutrophils Relative %: 73 %
Platelets: 268 10*3/uL (ref 150–400)
RBC: 4.46 MIL/uL (ref 4.22–5.81)
RDW: 13.5 % (ref 11.5–15.5)
WBC: 8 10*3/uL (ref 4.0–10.5)

## 2018-08-29 LAB — POCT I-STAT EG7
Acid-base deficit: 21 mmol/L — ABNORMAL HIGH (ref 0.0–2.0)
Bicarbonate: 6 mmol/L — ABNORMAL LOW (ref 20.0–28.0)
Calcium, Ion: 1.06 mmol/L — ABNORMAL LOW (ref 1.15–1.40)
HCT: 35 % — ABNORMAL LOW (ref 39.0–52.0)
Hemoglobin: 11.9 g/dL — ABNORMAL LOW (ref 13.0–17.0)
O2 Saturation: 87 %
Potassium: 4.7 mmol/L (ref 3.5–5.1)
SODIUM: 136 mmol/L (ref 135–145)
TCO2: 7 mmol/L — ABNORMAL LOW (ref 22–32)
pCO2, Ven: 17.4 mmHg — CL (ref 44.0–60.0)
pH, Ven: 7.149 — CL (ref 7.250–7.430)
pO2, Ven: 67 mmHg — ABNORMAL HIGH (ref 32.0–45.0)

## 2018-08-29 LAB — CBG MONITORING, ED
Glucose-Capillary: 388 mg/dL — ABNORMAL HIGH (ref 70–99)
Glucose-Capillary: 357 mg/dL — ABNORMAL HIGH (ref 70–99)
Glucose-Capillary: 304 mg/dL — ABNORMAL HIGH (ref 70–99)

## 2018-08-29 LAB — BASIC METABOLIC PANEL
Anion gap: 25 — ABNORMAL HIGH (ref 5–15)
Anion gap: 17 — ABNORMAL HIGH (ref 5–15)
BUN: 10 mg/dL (ref 6–20)
BUN: 7 mg/dL (ref 6–20)
CO2: 6 mmol/L — ABNORMAL LOW (ref 22–32)
CO2: 7 mmol/L — ABNORMAL LOW (ref 22–32)
Calcium: 7.9 mg/dL — ABNORMAL LOW (ref 8.9–10.3)
Calcium: 7.5 mg/dL — ABNORMAL LOW (ref 8.9–10.3)
Chloride: 105 mmol/L (ref 98–111)
Chloride: 114 mmol/L — ABNORMAL HIGH (ref 98–111)
Creatinine, Ser: 1.14 mg/dL (ref 0.61–1.24)
Creatinine, Ser: 1 mg/dL (ref 0.61–1.24)
GFR calc Af Amer: >60 mL/min (ref >60)
GFR calc non Af Amer: >60 mL/min (ref >60)
GFR calc non Af Amer: >60 mL/min (ref >60)
Glucose, Bld: 439 mg/dL — ABNORMAL HIGH (ref 70–99)
Glucose, Bld: 191 mg/dL — ABNORMAL HIGH (ref 70–99)
Potassium: 4.5 mmol/L (ref 3.5–5.1)
Potassium: 4.9 mmol/L (ref 3.5–5.1)
SODIUM: 136 mmol/L (ref 135–145)
Sodium: 138 mmol/L (ref 135–145)

## 2018-08-29 LAB — RAPID URINE DRUG SCREEN, HOSP PERFORMED
Amphetamines: NOT DETECTED
BARBITURATES: NOT DETECTED
Benzodiazepines: NOT DETECTED
Cocaine: NOT DETECTED
Opiates: NOT DETECTED
Tetrahydrocannabinol: NOT DETECTED

## 2018-08-29 LAB — GLUCOSE, CAPILLARY
GLUCOSE-CAPILLARY: 196 mg/dL — AB (ref 70–99)
Glucose-Capillary: 188 mg/dL — ABNORMAL HIGH (ref 70–99)
Glucose-Capillary: 138 mg/dL — ABNORMAL HIGH (ref 70–99)
Glucose-Capillary: 154 mg/dL — ABNORMAL HIGH (ref 70–99)
Glucose-Capillary: 161 mg/dL — ABNORMAL HIGH (ref 70–99)

## 2018-08-29 LAB — URINALYSIS, ROUTINE W REFLEX MICROSCOPIC
Bacteria, UA: NONE SEEN
Bilirubin Urine: NEGATIVE
Glucose, UA: >=500 mg/dL — AB
Hgb urine dipstick: NEGATIVE
Ketones, ur: 80 mg/dL — AB
Leukocytes,Ua: NEGATIVE
Nitrite: NEGATIVE
Protein, ur: NEGATIVE mg/dL
SPECIFIC GRAVITY, URINE: 1.018 (ref 1.005–1.030)
pH: 5 (ref 5.0–8.0)

## 2018-08-29 LAB — HEPATIC FUNCTION PANEL
ALT: 28 U/L (ref 0–44)
AST: 14 U/L — ABNORMAL LOW (ref 15–41)
Albumin: 3.8 g/dL (ref 3.5–5.0)
Alkaline Phosphatase: 101 U/L (ref 38–126)
Bilirubin, Direct: 0.3 mg/dL — ABNORMAL HIGH (ref 0.0–0.2)
Indirect Bilirubin: 1.6 mg/dL — ABNORMAL HIGH (ref 0.3–0.9)
Total Bilirubin: 1.9 mg/dL — ABNORMAL HIGH (ref 0.3–1.2)
Total Protein: 6.5 g/dL (ref 6.5–8.1)

## 2018-08-29 LAB — BETA-HYDROXYBUTYRIC ACID: Beta-Hydroxybutyric Acid: >8 mmol/L — ABNORMAL HIGH (ref 0.05–0.27)

## 2018-08-29 LAB — ETHANOL: Alcohol, Ethyl (B): <10 mg/dL (ref <10)

## 2018-08-29 LAB — INFLUENZA PANEL BY PCR (TYPE A & B)
Influenza A By PCR: NEGATIVE
Influenza B By PCR: NEGATIVE

## 2018-08-29 LAB — I-STAT CREATININE, ED: Creatinine, Ser: 0.6 mg/dL — ABNORMAL LOW (ref 0.61–1.24)

## 2018-08-29 MED ORDER — DEXTROSE-NACL 5-0.45 % IV SOLN: INTRAVENOUS | Status: DC

## 2018-08-29 MED ORDER — SODIUM CHLORIDE 0.9 % IV BOLUS
1000.0000 mL | Freq: Once | INTRAVENOUS | Status: AC
  Administered 2018-08-29: 1000 mL via INTRAVENOUS

## 2018-08-29 MED ORDER — ENOXAPARIN SODIUM 40 MG/0.4ML ~~LOC~~ SOLN
40.0000 mg | SUBCUTANEOUS | Status: DC
  Filled 2018-08-29: qty 0.4

## 2018-08-29 MED ORDER — SODIUM CHLORIDE 0.9 % IV SOLN
INTRAVENOUS | Status: DC
  Administered 2018-08-29: 13:00:00 via INTRAVENOUS

## 2018-08-29 MED ORDER — DEXTROSE-NACL 5-0.45 % IV SOLN
INTRAVENOUS | Status: DC
  Administered 2018-08-29: 18:00:00 via INTRAVENOUS

## 2018-08-29 MED ORDER — POTASSIUM CHLORIDE 10 MEQ/100ML IV SOLN
10.0000 meq | INTRAVENOUS | Status: DC
  Filled 2018-08-29: qty 100

## 2018-08-29 MED ORDER — SODIUM CHLORIDE 0.9 % IV SOLN: INTRAVENOUS | Status: DC

## 2018-08-29 MED ORDER — POTASSIUM CHLORIDE 10 MEQ/100ML IV SOLN: 10.0000 meq | INTRAVENOUS | Status: DC

## 2018-08-29 MED ORDER — INSULIN REGULAR(HUMAN) IN NACL 100-0.9 UT/100ML-% IV SOLN: INTRAVENOUS | Status: DC

## 2018-08-29 MED ORDER — INSULIN REGULAR(HUMAN) IN NACL 100-0.9 UT/100ML-% IV SOLN
INTRAVENOUS | Status: DC
  Administered 2018-08-29: 3 [IU]/h via INTRAVENOUS
  Filled 2018-08-29: qty 100

## 2018-08-29 MED ORDER — LORAZEPAM 0.5 MG PO TABS: 0.5000 mg | ORAL_TABLET | Freq: Four times a day (QID) | ORAL | Status: DC | PRN

## 2018-08-29 MED ORDER — BUPRENORPHINE HCL-NALOXONE HCL 8-2 MG SL FILM: 1.0000 | ORAL_FILM | Freq: Two times a day (BID) | SUBLINGUAL | Status: DC

## 2018-08-29 MED ORDER — TRAZODONE HCL 50 MG PO TABS: 100.0000 mg | ORAL_TABLET | Freq: Every evening | ORAL | Status: DC | PRN

## 2018-08-29 MED ORDER — SODIUM CHLORIDE (PF) 0.9 % IJ SOLN
INTRAMUSCULAR | Status: AC
  Filled 2018-08-29: qty 50

## 2018-08-29 MED ORDER — POTASSIUM CHLORIDE CRYS ER 10 MEQ PO TBCR
10.0000 meq | EXTENDED_RELEASE_TABLET | ORAL | Status: DC
  Administered 2018-08-29 (×2): 10 meq via ORAL
  Filled 2018-08-29: qty 1

## 2018-08-29 MED ORDER — IOPAMIDOL (ISOVUE-300) INJECTION 61%
INTRAVENOUS | Status: AC
  Administered 2018-08-29: 100 mL
  Filled 2018-08-29: qty 100

## 2018-08-29 NOTE — ED Triage Notes (Signed)
Per EMS: Pt's roommate called out from home.  Pt has been in bed for past 2 days, not feeling well.  Pt not answering question.  C/o of R abdominal pain.  pts CBG was 417.  Pt not taking insulin.  Pt signed AMA from hospital yesterday.  Pt given 4 mg zofran IV, 1 L NS.

## 2018-08-29 NOTE — ED Notes (Signed)
ED TO INPATIENT HANDOFF REPORT  Name/Age/Gender [name] [name]. 18 y.o. male  Code Status Code Status History    Date Active Date Inactive Code Status Order ID Comments User Context   08/18/2018 0828 08/20/2018 1612 Full Code [id number]  Osvaldo Shipper, MD ED   08/09/2018 0323 08/09/2018 2339 Full Code [id number]  Eduard Clos, MD ED   07/14/2018 1728 07/14/2018 2303 Full Code [id number]  Ward, Chase Picket, PA-C ED   04/14/2018 2323 04/19/2018 2143 Full Code [id number]  Elpidio Anis, PA-C ED   04/04/2018 1049 04/05/2018 2218 Full Code [id number]  Myrtie Neither, MD ED   04/02/2018 1311 04/03/2018 0010 Full Code [id number]  Elgergawy, Leana Roe, MD Inpatient   03/11/2018 2143 03/12/2018 1450 Full Code [id number]  Linwood Dibbles, MD ED   02/25/2018 1556 03/09/2018 1839 Full Code [id number]  Rankin, [name], NP Inpatient   02/23/2018 2051 02/25/2018 1511 Full Code [id number]  Alveria Apley, PA-C ED   08/14/2017 2058 08/17/2017 2012 Full Code [id number]  Georgiana Shore, PA-C ED   08/11/2017 2031 08/12/2017 1453 Full Code [id number]  Shaune Pollack, MD ED   07/29/2017 1628 08/08/2017 0830 Full Code [id number]  Laveda Abbe, NP Inpatient   07/24/2017 1954 07/26/2017 1041 Full Code [id number]  Charm Rings, NP Inpatient   07/24/2017 0716 07/24/2017 1923 Full Code [id number]  Pricilla Loveless, MD ED   07/16/2017 2203 07/18/2017 2151 Full Code [id number]  Eduard Clos, MD ED   01/26/2017 1130 01/29/2017 1758 Full Code [id number]  Deno Lunger, MD ED   12/18/2016 1529 12/21/2016 1625 Full Code [id number]  Bobette Mo, MD ED   04/17/2016 1707 05/08/2016 1235 Full Code [id number]  Lyndal Pulley, MD ED   03/10/2015 2117 03/17/2015 0000 Full Code [id number]  Earley Favor, NP ED   02/19/2015 1545 02/26/2015 1952 Full Code [id number]  [place], Katherine Inpatient   02/19/2015 1541 02/19/2015 1545 Full Code [id number]  [place], Katherine Inpatient   08/21/2014 2241 08/26/2014 1507 Full Code [id number]  Joanna Puff, MD Inpatient   04/06/2014 1638 04/13/2014 1627 Full Code [id number]  Chauncey Mann, MD Inpatient   03/31/2014 1643 04/06/2014 1638 Full Code [id number]  Sherrin Daisy, MD ED   03/22/2014 2252 03/31/2014 1643 Full Code [id number]  Antony Madura, PA-C ED   03/03/2014 0502 03/09/2014 1554 Full Code [id number]  Alfredo Bach, RN Inpatient   02/16/2014 1652 02/23/2014 1903 Full Code [id number]  Chauncey Mann, MD Inpatient   02/14/2014 2105 02/16/2014 1652 Full Code [id number]  Nada Boozer, MD Inpatient   02/02/2014 1621 02/06/2014 1901 Full Code [id number]  Birder Robson, MD Inpatient   06/17/2013 2139 06/21/2013 1900 Full Code [id number]  Arley Phenix, MD ED      Home/SNF/Other Home  Chief Complaint Hyperglycemia  Level of Care/Admitting Diagnosis ED Disposition    ED Disposition Condition Comment   Admit  Hospital Area: [facility] [[id number]]  Level of Care: Stepdown [14]  Admit to SDU based on following criteria: Other see comments  Comments: DKA  Diagnosis: DKA (diabetic ketoacidoses) [facility]) [id number]  Admitting Physician: Rolly Salter [[id number]]  Attending Physician: Rolly Salter [[id number]]  Estimated length of stay: past midnight tomorrow  Certification:: I certify this patient will need inpatient services for at least 2 midnights  PT Class (Do Not Modify): Inpatient [101]  PT Acc Code (Do Not Modify): Private [1]       Medical History Past Medical History:  Diagnosis Date  .  ADD (attention deficit disorder)   . Allergy   . Anxiety   . Asthma   . Depression   . Epileptic seizure (HCC)    "both petit and grand mal; last sz ~ 2 wk ago" (06/17/2013)  . Headache(784.0)    "2-3 times/wk usually" (06/17/2013)  . Heart murmur    "heard a slight one earlier today" (06/17/2013)  . Homeless   . Migraine    "maybe once/month; it's severe" (06/17/2013)  . ODD (oppositional defiant disorder)   . Sickle cell trait (HCC)   . Type I  diabetes mellitus (HCC)    "that's what they're thinking now" (06/17/2013)  . Vision abnormalities    "takes him longer to focus cause; from his sz" (06/17/2013)    Allergies Allergies  Allergen Reactions  . Bee Venom Anaphylaxis  . Fish Allergy Anaphylaxis  . Shellfish Allergy Anaphylaxis  . Tea Anaphylaxis    IV Location/Drains/Wounds Patient Lines/Drains/Airways Status   Active Line/Drains/Airways    Name:   Placement date:   Placement time:   Site:   Days:   Peripheral IV 08/26/18 Right Antecubital   08/26/18    1215    Antecubital   3   Peripheral IV 08/29/18 Left Wrist   08/29/18    -    Wrist   less than 1   Wound / Incision (Open or Dehisced) 04/04/18 Non-pressure wound Foot Right   04/04/18    1900    Foot   147          Labs/Imaging Results for orders placed or performed during the hospital encounter of 08/29/18 (from the past 48 hour(s))  Basic metabolic panel     Status: Abnormal   Collection Time: 08/29/18 12:24 PM  Result Value Ref Range   Sodium 136 135 - 145 mmol/L   Potassium 4.5 3.5 - 5.1 mmol/L   Chloride 105 98 - 111 mmol/L   CO2 6 (L) 22 - 32 mmol/L   Glucose, Bld 439 (H) 70 - 99 mg/dL   BUN 10 6 - 20 mg/dL   Creatinine, Ser 7.25 0.61 - 1.24 mg/dL   Calcium 7.9 (L) 8.9 - 10.3 mg/dL   GFR calc non Af Amer >60 >60 mL/min   GFR calc Af Amer >60 >60 mL/min   Anion gap 25 (H) 5 - 15    Comment: Performed at [name], [address], [place], [place] [address]  CBC with Differential (PNL)     Status: Abnormal   Collection Time: 08/29/18 12:24 PM  Result Value Ref Range   WBC 8.0 4.0 - 10.5 K/uL   RBC 4.46 4.22 - 5.81 MIL/uL   Hemoglobin 13.0 13.0 - 17.0 g/dL   HCT 03.4 74.2 - 59.5 %   MCV 88.8 80.0 - 100.0 fL   MCH 29.1 26.0 - 34.0 pg   MCHC 32.8 30.0 - 36.0 g/dL   RDW 63.8 75.6 - 43.3 %   Platelets 268 150 - 400 K/uL   nRBC 0.0 0.0 - 0.2 %   Neutrophils Relative % 73 %   Neutro Abs 5.9 1.7 - 7.7 K/uL   Lymphocytes  Relative 15 %   Lymphs Abs 1.2 0.7 - 4.0 K/uL   Monocytes Relative 7 %   Monocytes Absolute 0.5 0.1 - 1.0 K/uL   Eosinophils Relative 0 %   Eosinophils Absolute 0.0 0.0 - 0.5 K/uL   Basophils Relative 1 %   Basophils Absolute 0.1 0.0 - 0.1 K/uL  Immature Granulocytes 4 %   Abs Immature Granulocytes 0.30 (H) 0.00 - 0.07 K/uL    Comment: Performed at [facility], [address], [place], [place] [address]  Urinalysis, Routine w reflex microscopic     Status: Abnormal   Collection Time: 08/29/18 12:24 PM  Result Value Ref Range   Color, Urine STRAW (A) YELLOW   APPearance CLEAR CLEAR   Specific Gravity, Urine 1.018 1.005 - 1.030   pH 5.0 5.0 - 8.0   Glucose, UA >=500 (A) NEGATIVE mg/dL   Hgb urine dipstick NEGATIVE NEGATIVE   Bilirubin Urine NEGATIVE NEGATIVE   Ketones, ur 80 (A) NEGATIVE mg/dL   Protein, ur NEGATIVE NEGATIVE mg/dL   Nitrite NEGATIVE NEGATIVE   Leukocytes,Ua NEGATIVE NEGATIVE   RBC / HPF 0-5 0 - 5 RBC/hpf   WBC, UA 0-5 0 - 5 WBC/hpf   Bacteria, UA NONE SEEN NONE SEEN   Mucus PRESENT     Comment: Performed at [facility], [address], [place], [place] [address]  Rapid urine drug screen (hospital performed)     Status: None   Collection Time: 08/29/18 12:24 PM  Result Value Ref Range   Opiates NONE DETECTED NONE DETECTED   Cocaine NONE DETECTED NONE DETECTED   Benzodiazepines NONE DETECTED NONE DETECTED   Amphetamines NONE DETECTED NONE DETECTED   Tetrahydrocannabinol NONE DETECTED NONE DETECTED   Barbiturates NONE DETECTED NONE DETECTED    Comment: (NOTE) DRUG SCREEN FOR MEDICAL PURPOSES ONLY.  IF CONFIRMATION IS NEEDED FOR ANY PURPOSE, NOTIFY LAB WITHIN 5 DAYS. LOWEST DETECTABLE LIMITS FOR URINE DRUG SCREEN Drug Class                     Cutoff (ng/mL) Amphetamine and metabolites    1000 Barbiturate and metabolites    200 Benzodiazepine                 200 Tricyclics and metabolites     300 Opiates and  metabolites        300 Cocaine and metabolites        300 THC                            50 Performed at [facility], [address] [place], [place] [address]   Ethanol     Status: None   Collection Time: 08/29/18 12:24 PM  Result Value Ref Range   Alcohol, Ethyl (B) <10 <10 mg/dL    Comment: (NOTE) Lowest detectable limit for serum alcohol is 10 mg/dL. For medical purposes only. Performed at [facility], [address] [place], [place] [address]   Hepatic function panel     Status: Abnormal   Collection Time: 08/29/18 12:24 PM  Result Value Ref Range   Total Protein 6.5 6.5 - 8.1 g/dL   Albumin 3.8 3.5 - 5.0 g/dL   AST 14 (L) 15 - 41 U/L   ALT 28 0 - 44 U/L   Alkaline Phosphatase 101 38 - 126 U/L   Total Bilirubin 1.9 (H) 0.3 - 1.2 mg/dL   Bilirubin, Direct 0.3 (H) 0.0 - 0.2 mg/dL   Indirect Bilirubin 1.6 (H) 0.3 - 0.9 mg/dL    Comment: Performed at [facility], [address] [place], [place] [address]  Blood gas, arterial (at [facility])     Status: Abnormal   Collection Time: 08/29/18 12:30 PM  Result Value Ref Range   FIO2 21.00    Delivery systems ROOM AIR    pH, Arterial 7.197 (LL) 7.350 - 7.450    Comment: CRITICAL RESULT CALLED TO, READ BACK BY AND VERIFIED WITH: DR.HAVILAND AT 1241 BY T.BURGESS,RRT,RCP ON 08/29/2018    pCO2 arterial BELOW REPORTABLE RANGE 32.0 - 48.0 mmHg    Comment: CRITICAL RESULT CALLED TO, READ BACK BY AND VERIFIED WITH: DR.HAVILAND AT 1241 BY T.BURGESS,RRT,RCP ON 08/29/2018    pO2, Arterial 124 (H) 83.0 - 108.0 mmHg   O2 Saturation 97.9 %   Patient temperature 98.6    Collection site RIGHT RADIAL    Drawn by [id number]    Sample type ARTERIAL    Allens test (pass/fail) PASS PASS    Comment: Performed at [facility], [address], [place], [place] [address]  CBG monitoring, ED     Status: Abnormal   Collection Time: 08/29/18  1:08 PM  Result Value Ref Range    Glucose-Capillary 388 (H) 70 - 99 mg/dL  POCT I-Stat EG7     Status: Abnormal   Collection Time: 08/29/18  1:21 PM  Result Value Ref Range   pH, Ven 7.149 (LL) 7.250 - 7.430   pCO2, Ven 17.4 (LL) 44.0 - 60.0 mmHg   pO2, Ven 67.0 (H) 32.0 - 45.0 mmHg   Bicarbonate 6.0 (L) 20.0 - 28.0 mmol/L   TCO2 7 (L) 22 - 32 mmol/L   O2 Saturation 87.0 %   Acid-base deficit 21.0 (H) 0.0 - 2.0 mmol/L   Sodium 136 135 - 145 mmol/L   Potassium 4.7 3.5 - 5.1 mmol/L   Calcium, Ion 1.06 (L) 1.15 - 1.40 mmol/L   HCT 35.0 (L) 39.0 - 52.0 %   Hemoglobin 11.9 (L) 13.0 - 17.0 g/dL   Patient temperature HIDE    Sample type VENOUS    Comment NOTIFIED PHYSICIAN   I-Stat Creatinine, ED (not at [facility])     Status: Abnormal   Collection Time: 08/29/18  1:30 PM  Result Value Ref Range   Creatinine, Ser 0.60 (L) 0.61 - 1.24 mg/dL  CBG monitoring, ED     Status: Abnormal   Collection Time: 08/29/18  2:05 PM  Result Value Ref Range   Glucose-Capillary 357 (H) 70 - 99 mg/dL  Influenza panel by PCR (type A & B)     Status: None   Collection Time: 08/29/18  2:21 PM  Result Value Ref Range   Influenza A By PCR NEGATIVE NEGATIVE   Influenza B By PCR NEGATIVE NEGATIVE    Comment: (NOTE) The Xpert Xpress Flu assay is intended as an aid in the diagnosis of  influenza and should not be used as a sole basis for treatment.  This  assay is FDA approved for nasopharyngeal swab specimens only. Nasal  washings and aspirates are unacceptable for Xpert Xpress Flu testing. Performed at [facility], [address], [place], [place] [address]   CBG monitoring, ED     Status: Abnormal   Collection Time: 08/29/18  3:32 PM  Result Value Ref Range   Glucose-Capillary 304 (H) 70 - 99 mg/dL   Dg Chest 1 View  Result Date: 08/29/2018 CLINICAL DATA:  DKA EXAM: CHEST  1 VIEW COMPARISON:  08/18/2018 FINDINGS: The heart size and mediastinal contours are within normal limits. Both lungs are clear. The visualized  skeletal structures are unremarkable. IMPRESSION: No active disease. Electronically Signed   By: Elige Ko   On: 08/29/2018  13:16   Ct Abdomen Pelvis W Contrast  Result Date: 08/29/2018 CLINICAL DATA:  Right-sided abdominal pain and fever for 2 days. Suspected appendicitis. EXAM: CT ABDOMEN AND PELVIS WITH CONTRAST TECHNIQUE: Multidetector CT imaging of the abdomen and pelvis was performed using the standard protocol following bolus administration of intravenous contrast. CONTRAST:  ISOVUE-300 IOPAMIDOL (ISOVUE-300) INJECTION 61% COMPARISON:  07/16/2017 FINDINGS: Lower Chest: No acute findings. Hepatobiliary: No hepatic masses identified. Gallbladder is unremarkable. Pancreas:  No mass or inflammatory changes. Spleen: Within normal limits in size and appearance. Adrenals/Urinary Tract: No masses identified. No evidence of hydronephrosis. Mild diffuse bladder wall thickening is noted, suspicious for cystitis. Stomach/Bowel: No evidence of obstruction, inflammatory process or abnormal fluid collections. Normal appendix visualized. Vascular/Lymphatic: No pathologically enlarged lymph nodes. No abdominal aortic aneurysm. Reproductive:  No mass or other significant abnormality. Other:  None. Musculoskeletal:  No suspicious bone lesions identified. IMPRESSION: 1. Mild diffuse bladder wall thickening, suspicious for cystitis. Suggest correlation with urinalysis. 2. No evidence of appendicitis or other acute findings. Electronically Signed   By: Myles Rosenthal M.D.   On: 08/29/2018 14:54    Pending Labs Unresulted Labs (From admission, onward)    Start     Ordered   08/29/18 1517  Urine culture  ONCE - STAT,   STAT     08/29/18 1516   08/29/18 1415  Basic metabolic panel  Now then every 2 hours,   STAT    Comments:  Every 2 hours while on glucose stabilizer.    08/29/18 1415          Vitals/Pain Today's Vitals   08/29/18 1240 08/29/18 1339 08/29/18 1342 08/29/18 1537  BP: 102/64 (!) 103/57 (!)  103/57 121/61  Pulse:  (!) 114 (!) 105 95  Resp:  (!) 21 (!) 23 19  Temp:      TempSrc:      SpO2:   100% 100%    Isolation Precautions Droplet precaution  Medications Medications  sodium chloride 0.9 % bolus 1,000 mL (0 mLs Intravenous Stopped 08/29/18 1434)    And  sodium chloride 0.9 % bolus 1,000 mL (0 mLs Intravenous Stopped 08/29/18 1434)    And  0.9 %  sodium chloride infusion ( Intravenous New Bag/Given 08/29/18 1239)  sodium chloride (PF) 0.9 % injection (has no administration in time range)  insulin regular, human (MYXREDLIN) 100 units/ 100 mL infusion (3 Units/hr Intravenous New Bag/Given 08/29/18 1425)  dextrose 5 %-0.45 % sodium chloride infusion (has no administration in time range)  potassium chloride (K-DUR,KLOR-CON) CR tablet 10 mEq (has no administration in time range)  iopamidol (ISOVUE-300) 61 % injection (100 mLs  Contrast Given 08/29/18 1437)  sodium chloride 0.9 % bolus 1,000 mL (1,000 mLs Intravenous New Bag/Given 08/29/18 1436)    Mobility walks with person assist

## 2018-08-29 NOTE — ED Notes (Signed)
Patient transported to CT 

## 2018-08-29 NOTE — H&P (Signed)
[facility] History and Physical   Patient: [name] [name]. WYS:[id number]   PCP: Patient, No Pcp Per DOB: 26-Dec-2000   DOA: 08/29/2018   DOS: 08/29/2018   DOS: the patient was seen and examined on 08/29/2018  Patient coming from: The patient is coming from home.  Chief Complaint: Unresponsiveness.  HPI: [name] [name]. is a 18 y.o. male with Past medical history of type I DM, seizures, anxiety, ADHD, depression. The patient presents with an episode of unresponsiveness.  Patient at the time of my evaluation reports that he has some dizziness, some abdominal pain as well as nausea. No fever no chills no diarrhea. He was found by his roommate unresponsive on arrival patient was given IV hydration with improvement in mentation. Patient also reported some right arm pain where he has a new tattoo. Patient tells me that he does not have money to buy insulin and he also does not have any means of transportation to go to the pharmacy to get it.  ED Course: Started on IV insulin.  ABG was showing severe acidosis.  Anion gap was elevated.  Patient was referred for admission.  At his baseline ambulates without any support And is independent for most of his ADL; manages his medication on his own.  Review of Systems: as mentioned in the history of present illness.  All other systems reviewed and are negative.  Past Medical History:  Diagnosis Date  . ADD (attention deficit disorder)   . Allergy   . Anxiety   . Asthma   . Depression   . Epileptic seizure (HCC)    "both petit and grand mal; last sz ~ 2 wk ago" (06/17/2013)  . Headache(784.0)    "2-3 times/wk usually" (06/17/2013)  . Heart murmur    "heard a slight one earlier today" (06/17/2013)  . Homeless   . Migraine    "maybe once/month; it's severe" (06/17/2013)  . ODD (oppositional defiant disorder)   . Sickle cell trait (HCC)   . Type I diabetes mellitus (HCC)    "that's what they're thinking now" (06/17/2013)  . Vision  abnormalities    "takes him longer to focus cause; from his sz" (06/17/2013)   Past Surgical History:  Procedure Laterality Date  . CIRCUMCISION  2000  . FINGER SURGERY Left 2001   "crushed pinky; had to repair it" (06/17/2013)   Social History:  reports that he is a non-smoker but has been exposed to tobacco smoke. He has never used smokeless tobacco. He reports that he does not drink alcohol or use drugs.  Allergies  Allergen Reactions  . Bee Venom Anaphylaxis  . Fish Allergy Anaphylaxis  . Shellfish Allergy Anaphylaxis  . Tea Anaphylaxis   Family History  Problem Relation Age of Onset  . Asthma Mother   . COPD Mother   . Other Mother        possible autoimmune, unclear  . Diabetes Maternal Grandmother   . Heart disease Maternal Grandmother   . Hypertension Maternal Grandmother   . Mental illness Maternal Grandmother   . Heart disease Maternal Grandfather   . Hyperlipidemia Paternal Grandmother   . Hyperlipidemia Paternal Grandfather   . Migraines Sister        Hemiplegic Migraines      Prior to Admission medications   Medication Sig Start Date End Date Taking? Authorizing Provider  glucose blood (ACCU-CHEK GUIDE) test strip Check glucose 6x daily: For blood sugar checks 03/22/18  Yes Long, Arlyss Repress, MD  albuterol (PROVENTIL HFA;VENTOLIN HFA) 108 (90 Base) MCG/ACT inhaler Inhale 1 puff into the lungs every 4 (four) hours as needed for wheezing or shortness of breath.    [provider]  hydrOXYzine (ATARAX/VISTARIL) 50 MG tablet Take 100 mg by mouth daily as needed for anxiety.     [provider]  insulin aspart (NOVOLOG FLEXPEN) 100 UNIT/ML FlexPen Inject 0-15 Units into the skin 3 (three) times daily with meals. (Sliding scale): For diabetes management 03/09/18   Armandina Stammer I, NP  insulin glargine (LANTUS) 100 UNIT/ML injection Inject 0.3 mLs (30 Units total) into the skin 2 (two) times daily. 07/12/18   Ward, Kristen N, DO  SUBOXONE 8-2 MG FILM Place  1 Film under the tongue 2 (two) times daily. 08/11/18   [provider]  traZODone (DESYREL) 50 MG tablet Take 100 mg by mouth at bedtime as needed for sleep.     [provider]    Physical Exam: Vitals:   08/29/18 1339 08/29/18 1342 08/29/18 1537 08/29/18 1700  BP: (!) 103/57 (!) 103/57 121/61 (!) 123/51  Pulse: (!) 114 (!) 105 95 99  Resp: (!) 21 (!) 23 19 (!) 21  Temp:      TempSrc:      SpO2:  100% 100% 100%    General: Alert, Awake and Oriented to Time, Place and Person. Appear in moderate distress, affect appropriate Eyes: PERRL, Conjunctiva normal ENT: Oral Mucosa clear dry. Neck: no JVD, no Abnormal Mass Or lumps Cardiovascular: S1 and S2 Present, no Murmur, Peripheral Pulses Present Respiratory: normal respiratory effort, Bilateral Air entry equal and Decreased, no use of accessory muscle, Clear to Auscultation, no Crackles, no wheezes Abdomen: Bowel Sound present, Soft and no tenderness, no hernia Skin: no redness, no Rash, no induration Extremities: non Pedal edema, o calf tenderness Neurologic: Grossly no focal neuro deficit. Bilaterally Equal motor strength  Labs on Admission:  CBC: Recent Labs  Lab 08/26/18 1229 08/26/18 1255 08/29/18 1224 08/29/18 1321  WBC 6.3  --  8.0  --   NEUTROABS 4.3  --  5.9  --   HGB 14.7 14.6 13.0 11.9*  HCT 44.8 43.0 39.6 35.0*  MCV 85.7  --  88.8  --   PLT 297  --  268  --    Basic Metabolic Panel: Recent Labs  Lab 08/26/18 1229 08/26/18 1255 08/29/18 1224 08/29/18 1321 08/29/18 1330 08/29/18 1734  NA 129* 129* 136 136  --  138  K 4.1 4.2 4.5 4.7  --  4.9  CL 95*  --  105  --   --  114*  CO2 11*  --  6*  --   --  7*  GLUCOSE 501*  --  439*  --   --  191*  BUN 11  --  10  --   --  7  CREATININE 1.44*  --  1.14  --  0.60* 1.00  CALCIUM 8.3*  --  7.9*  --   --  7.5*   GFR: Estimated Creatinine Clearance: 124.7 mL/min (by C-G formula based on SCr of 1 mg/dL). Liver Function Tests: Recent Labs  Lab  08/26/18 1229 08/29/18 1224  AST 15 14*  ALT 47* 28  ALKPHOS 93 101  BILITOT 1.5* 1.9*  PROT 6.4* 6.5  ALBUMIN 3.6 3.8   No results for input(s): LIPASE, AMYLASE in the last 168 hours. No results for input(s): AMMONIA in the last 168 hours. Coagulation Profile: No results for input(s): INR, PROTIME  in the last 168 hours. Cardiac Enzymes: No results for input(s): CKTOTAL, CKMB, CKMBINDEX, TROPONINI in the last 168 hours. BNP (last 3 results) No results for input(s): PROBNP in the last 8760 hours. HbA1C: No results for input(s): HGBA1C in the last 72 hours. CBG: Recent Labs  Lab 08/29/18 1308 08/29/18 1405 08/29/18 1532 08/29/18 1707 08/29/18 1833  GLUCAP 388* 357* 304* 196* 188*   Lipid Profile: No results for input(s): CHOL, HDL, LDLCALC, TRIG, CHOLHDL, LDLDIRECT in the last 72 hours. Thyroid Function Tests: No results for input(s): TSH, T4TOTAL, FREET4, T3FREE, THYROIDAB in the last 72 hours. Anemia Panel: No results for input(s): VITAMINB12, FOLATE, FERRITIN, TIBC, IRON, RETICCTPCT in the last 72 hours. Urine analysis:    Component Value Date/Time   COLORURINE STRAW (A) 08/29/2018 1224   APPEARANCEUR CLEAR 08/29/2018 1224   LABSPEC 1.018 08/29/2018 1224   PHURINE 5.0 08/29/2018 1224   GLUCOSEU >=500 (A) 08/29/2018 1224   HGBUR NEGATIVE 08/29/2018 1224   BILIRUBINUR NEGATIVE 08/29/2018 1224   KETONESUR 80 (A) 08/29/2018 1224   PROTEINUR NEGATIVE 08/29/2018 1224   UROBILINOGEN 0.2 03/13/2015 0123   NITRITE NEGATIVE 08/29/2018 1224   LEUKOCYTESUR NEGATIVE 08/29/2018 1224    Radiological Exams on Admission: Dg Chest 1 View  Result Date: 08/29/2018 CLINICAL DATA:  DKA EXAM: CHEST  1 VIEW COMPARISON:  08/18/2018 FINDINGS: The heart size and mediastinal contours are within normal limits. Both lungs are clear. The visualized skeletal structures are unremarkable. IMPRESSION: No active disease. Electronically Signed   By: Elige Ko   On: 08/29/2018 13:16   Ct  Abdomen Pelvis W Contrast  Result Date: 08/29/2018 CLINICAL DATA:  Right-sided abdominal pain and fever for 2 days. Suspected appendicitis. EXAM: CT ABDOMEN AND PELVIS WITH CONTRAST TECHNIQUE: Multidetector CT imaging of the abdomen and pelvis was performed using the standard protocol following bolus administration of intravenous contrast. CONTRAST:  ISOVUE-300 IOPAMIDOL (ISOVUE-300) INJECTION 61% COMPARISON:  07/16/2017 FINDINGS: Lower Chest: No acute findings. Hepatobiliary: No hepatic masses identified. Gallbladder is unremarkable. Pancreas:  No mass or inflammatory changes. Spleen: Within normal limits in size and appearance. Adrenals/Urinary Tract: No masses identified. No evidence of hydronephrosis. Mild diffuse bladder wall thickening is noted, suspicious for cystitis. Stomach/Bowel: No evidence of obstruction, inflammatory process or abnormal fluid collections. Normal appendix visualized. Vascular/Lymphatic: No pathologically enlarged lymph nodes. No abdominal aortic aneurysm. Reproductive:  No mass or other significant abnormality. Other:  None. Musculoskeletal:  No suspicious bone lesions identified. IMPRESSION: 1. Mild diffuse bladder wall thickening, suspicious for cystitis. Suggest correlation with urinalysis. 2. No evidence of appendicitis or other acute findings. Electronically Signed   By: Myles Rosenthal M.D.   On: 08/29/2018 14:54   Assessment/Plan 1. DKA (diabetic ketoacidoses) (HCC) Presents with abdominal pain nausea and vomiting. Anion gap is 25. CO2 is 6. Patient presented with AMS. Currently back to his baseline. Repeat BMP shows the anion gap is improved to 17.  But bicarb is still low at 7. ABG showed severe acidosis. At present the patient is admitted to stepdown unit. Continue with IV fluids. May require bicarb drip. Patient left AMA last admission Tells me that he does not have money to buy his insulin and he does not have money for transportation go and get it from the  pharmacy as well. When I asked him why he left the hospital last time AMA he says that his visitor friends told the doctor that he wants to sign out from the hospital despite his refusal and willingness to  stay.  Opiate dependence: Appears to be on Subutex-reviewed prior H&P-has had prescriptions for it-resume Subutex.  History of ADHD, major depressive disorder. Not on any medication. May require psychiatric consultation.  Nutrition: NPO DVT Prophylaxis: subcutaneous Heparin  Advance goals of care discussion: full code   Consults: none  Family Communication: no family was present at bedside, at the time of interview.  Disposition: Admitted as inpatient, step-down unit. Likely to be discharged home, in 2 days.  Author: Lynden Oxford, MD [facility] 08/29/2018  To reach On-call, see care teams to locate the attending and reach out to them via [email or web address]. If 7PM-7AM, please contact night-coverage If you still have difficulty reaching the attending provider, please page the [facility] (Director on Call) for [facility] on amion for assistance.

## 2018-08-29 NOTE — Progress Notes (Signed)
Pt has refused 2000 BMET. Pt states " I dont want to be stuck right now". Pt has agreed to 0000 BMET. RN will continue to monitor. [facility] NP notified.

## 2018-08-29 NOTE — ED Notes (Signed)
Pt refused rectal temp.

## 2018-08-29 NOTE — ED Provider Notes (Signed)
[facility]-EMERGENCY DEPT Provider Note   CSN: [id number] Arrival date & time: 08/29/18  1149    History   Chief Complaint No chief complaint on file.   HPI [name] Roerig. is a 18 y.o. male.     HPI  Patient is a 18 year old male with a history of type 1 diabetes, epilepsy, anxiety, asthma, ADD, ODD presenting for hyperglycemia.  EMS was apparently called out to the scene after his roommate found him difficult to arouse.  Patient providing minimal history due to level of consciousness.  He does report that he is experiencing some right lower quadrant abdominal pain, as well as some right arm pain where he has a new tattoo.  Denies fevers or chills at home.  Denies cough, shortness of breath, or chest pain.  Patient reports he is also been vomiting at home, unknown how many times in the last 48 hours.  Patient presented on 08-26-2018 and was found to be in DKA at that time.  According to chart review, patient left AMA.  Patient denies today that leaving AMA was not an attempt at self-harm.  He is denying any suicidal ideations today.  Past Medical History:  Diagnosis Date  . ADD (attention deficit disorder)   . Allergy   . Anxiety   . Asthma   . Depression   . Epileptic seizure (HCC)    "both petit and grand mal; last sz ~ 2 wk ago" (06/17/2013)  . Headache(784.0)    "2-3 times/wk usually" (06/17/2013)  . Heart murmur    "heard a slight one earlier today" (06/17/2013)  . Homeless   . Migraine    "maybe once/month; it's severe" (06/17/2013)  . ODD (oppositional defiant disorder)   . Sickle cell trait (HCC)   . Type I diabetes mellitus (HCC)    "that's what they're thinking now" (06/17/2013)  . Vision abnormalities    "takes him longer to focus cause; from his sz" (06/17/2013)    Patient Active Problem List   Diagnosis Date Noted  . Atypical chest pain 08/19/2018  . Borderline personality disorder (HCC) 02/26/2018  . MDD (major depressive  disorder), recurrent, severe, 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  . 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 DKA, type 1 (HCC) 07/16/2017  . Nausea & vomiting 07/16/2017  . Uncontrolled type 1 diabetes circulatory disorder erectile dysfunction (HCC)   . DKA (diabetic ketoacidoses) (HCC) 12/18/2016  . History of seizures 12/01/2016  . History of migraine 12/01/2016  . Disordered eating 03/08/2015  . Ketonuria   . Adjustment reaction to medical therapy   . Non compliance w medication regimen   . 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 Dehydration 08/22/2014  . Hyperglycemia due to type 1 diabetes mellitus (HCC) 08/21/2014  . Type 1 diabetes mellitus with hyperglycemia (HCC)   . Noncompliance   . Generalized abdominal pain   . Glycosuria   . Depression 07/12/2014  . Involuntary movements 06/13/2014  . Bilateral leg pain 06/13/2014  . 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 Suicidal ideation 03/31/2014  . ODD (oppositional defiant disorder) 03/03/2014  . MDD (major depressive disorder), recurrent episode, moderate (HCC) 02/16/2014  . Attention deficit hyperactivity disorder (ADHD), combined type, moderate 02/16/2014  . Microcytic anemia 02/15/2014  . Hyperglycemia 02/14/2014  . Goiter 02/04/2014  . 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 Acquired acanthosis nigricans  02/04/2014  . Obesity, morbid (HCC) 02/04/2014  . Insulin resistance 02/04/2014  . Hyperinsulinemia 02/04/2014  . Hypoglycemia associated with diabetes (HCC) 02/02/2014  . Maladaptive health behaviors affecting medical condition 02/02/2014  . Hypoglycemia 02/02/2014  . Type 1 diabetes mellitus in patient 63 to 20 years of age with hemoglobin A1c goal of less than 7.5% (HCC) 01/26/2014  . Partial epilepsy with impairment of  consciousness (HCC) 12/13/2013  . 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 Migraine without aura 12/13/2013  . Body mass index, pediatric, greater than or equal to 95th percentile for age 30/16/2015  . Asthma 12/13/2013  . Hypoglycemia unawareness in type 1 diabetes mellitus (HCC) 08/08/2013  . Seizure disorder (HCC) 07/06/2013  . Short-term memory loss 07/06/2013  . Sickle cell trait (HCC) 07/06/2013  . Diabetes (HCC) 06/17/2013  . Diabetes mellitus, new onset (HCC) 06/17/2013    Past Surgical History:  Procedure Laterality Date  . CIRCUMCISION  2000  . FINGER SURGERY Left 2001   "crushed pinky; had to repair it" (06/17/2013)        Home Medications    Prior to Admission medications   Medication Sig Start Date End Date Taking? Authorizing Provider  albuterol (PROVENTIL HFA;VENTOLIN HFA) 108 (90 Base) MCG/ACT inhaler Inhale 1 puff into the lungs every 4 (four) hours as needed for wheezing or shortness of breath.    [provider]  glucose blood (ACCU-CHEK GUIDE) test strip Check glucose 6x daily: For blood sugar checks 03/22/18   Long, Arlyss Repress, MD  hydrOXYzine (ATARAX/VISTARIL) 50 MG tablet Take 100 mg by mouth daily as needed for anxiety.     [provider]  insulin aspart (NOVOLOG FLEXPEN) 100 UNIT/ML FlexPen Inject 0-15 Units into the skin 3 (three) times daily with meals. (Sliding scale): For diabetes management 03/09/18   Armandina Stammer I, NP  insulin glargine (LANTUS) 100 UNIT/ML injection Inject 0.3 mLs (30 Units total) into the skin 2 (two) times daily. 07/12/18   Ward, Kristen N, DO  SUBOXONE 8-2 MG FILM Place 1 Film under the tongue 2 (two) times daily. 08/11/18   [provider]  traZODone (DESYREL) 50 MG tablet Take 100 mg by mouth at bedtime as needed for sleep.     [provider]    Family History Family History  Problem Relation Age of Onset  . Asthma Mother   . COPD Mother   . Other Mother        possible  autoimmune, unclear  . Diabetes Maternal Grandmother   . Heart disease Maternal Grandmother   . Hypertension Maternal Grandmother   . Mental illness Maternal Grandmother   . Heart disease Maternal Grandfather   . Hyperlipidemia Paternal Grandmother   . Hyperlipidemia Paternal Grandfather   . Migraines Sister        Hemiplegic Migraines     Social History Social History   Tobacco Use  . Smoking status: Passive Smoke Exposure - Never Smoker  . Smokeless tobacco: Never Used  . Tobacco comment: Mom and dad smoke outside   Substance Use Topics  . Alcohol use: No    Alcohol/week: 0.0 standard drinks  . Drug use: No     Allergies   Bee venom; Fish allergy; Shellfish allergy; and Tea   Review of Systems Review of Systems  Constitutional: Positive for fatigue. Negative for chills and fever.  Respiratory: Negative for shortness of breath.   Cardiovascular: Negative for chest pain.  Gastrointestinal: Positive for abdominal distention, nausea and vomiting.  Neurological: Negative for headaches.   Unable to perform remainder of ROS due to patient's mild somnolence.  Physical Exam Updated Vital Signs BP 102/64 (BP Location: Left Arm)   Pulse 98   Temp 97.8 F (36.6 C) (Axillary)   Resp (!) 23   SpO2 100%   Physical Exam Vitals signs and nursing note reviewed.  Constitutional:      Appearance: He is well-developed. He is ill-appearing.     Comments: Patient mildly somnolent, but alert to voice.  HENT:     Head: Normocephalic and atraumatic.     Mouth/Throat:     Mouth: Mucous membranes are dry.     Comments: Very dry mucous membranes. Eyes:     Conjunctiva/sclera: Conjunctivae normal.     Pupils: Pupils are equal, round, and reactive to light.  Neck:     Musculoskeletal: Normal range of motion and neck supple.  Cardiovascular:     Rate and Rhythm: Normal rate and regular rhythm.     Heart sounds: S1 normal and S2 normal. No murmur.  Pulmonary:     Effort:  Pulmonary effort is normal.     Breath sounds: Normal breath sounds. No wheezing or rales.  Abdominal:     General: There is no distension.     Palpations: Abdomen is soft.     Tenderness: There is abdominal tenderness. There is no guarding.     Comments: Patient is generalized down on tenderness, however more focally in the right lower quadrant.  No guarding or rebound.  Musculoskeletal: Normal range of motion.        General: No deformity.  Lymphadenopathy:     Cervical: No cervical adenopathy.  Skin:    General: Skin is warm and dry.     Findings: No erythema or rash.  Neurological:     Mental Status: He is alert.     GCS: GCS eye subscore is 3. GCS verbal subscore is 4. GCS motor subscore is 6.     Comments: Slight somnolence but arousable to voice. Cranial nerves grossly intact. Patient moves extremities symmetrically and with good coordination.      ED Treatments / Results  Labs (all labs ordered are listed, but only abnormal results are displayed) Labs Reviewed  BASIC METABOLIC PANEL - Abnormal; Notable for the following components:      Result Value   CO2 6 (*)    Glucose, Bld 439 (*)    Calcium 7.9 (*)    Anion gap 25 (*)    All other components within normal limits  CBC WITH DIFFERENTIAL/PLATELET - Abnormal; Notable for the following components:   Abs Immature Granulocytes 0.30 (*)    All other components within normal limits  URINALYSIS, ROUTINE W REFLEX MICROSCOPIC - Abnormal; Notable for the following components:   Color, Urine STRAW (*)    Glucose, UA >=500 (*)    Ketones, ur 80 (*)    All other components within normal limits  HEPATIC FUNCTION PANEL - Abnormal; Notable for the following components:   AST 14 (*)    Total Bilirubin 1.9 (*)    Bilirubin, Direct 0.3 (*)    Indirect Bilirubin 1.6 (*)    All other components within normal limits  BLOOD GAS, ARTERIAL - Abnormal; Notable for the following components:   pH, Arterial 7.197 (*)    pO2, Arterial  124 (*)    All other components within normal limits  CBG MONITORING, ED - Abnormal; Notable for the following components:  Glucose-Capillary 388 (*)    All other components within normal limits  POCT I-STAT EG7 - Abnormal; Notable for the following components:   pH, Ven 7.149 (*)    pCO2, Ven 17.4 (*)    pO2, Ven 67.0 (*)    Bicarbonate 6.0 (*)    TCO2 7 (*)    Acid-base deficit 21.0 (*)    Calcium, Ion 1.06 (*)    HCT 35.0 (*)    Hemoglobin 11.9 (*)    All other components within normal limits  I-STAT CREATININE, ED - Abnormal; Notable for the following components:   Creatinine, Ser 0.60 (*)    All other components within normal limits  CBG MONITORING, ED - Abnormal; Notable for the following components:   Glucose-Capillary 357 (*)    All other components within normal limits  CBG MONITORING, ED - Abnormal; Notable for the following components:   Glucose-Capillary 304 (*)    All other components within normal limits  URINE CULTURE  RAPID URINE DRUG SCREEN, HOSP PERFORMED  ETHANOL  INFLUENZA PANEL BY PCR (TYPE A & B)  BASIC METABOLIC PANEL  BASIC METABOLIC PANEL  BASIC METABOLIC PANEL  BASIC METABOLIC PANEL  BASIC METABOLIC PANEL  BASIC METABOLIC PANEL  BASIC METABOLIC PANEL  CBG MONITORING, ED  CBG MONITORING, ED    EKG EKG Interpretation  Date/Time:  Sunday August 29 2018 14:03:41 EST Ventricular Rate:  106 PR Interval:    QRS Duration: 88 QT Interval:  480 QTC Calculation: 638 R Axis:   75 Text Interpretation:  Sinus tachycardia Nonspecific T abnormalities, diffuse leads Prolonged QT interval No significant change since last tracing Confirmed by Jacalyn Lefevre [phone]) on 08/29/2018 3:46:41 PM   Radiology Dg Chest 1 View  Result Date: 08/29/2018 CLINICAL DATA:  DKA EXAM: CHEST  1 VIEW COMPARISON:  08/18/2018 FINDINGS: The heart size and mediastinal contours are within normal limits. Both lungs are clear. The visualized skeletal structures are unremarkable.  IMPRESSION: No active disease. Electronically Signed   By: Elige Ko   On: 08/29/2018 13:16   Ct Abdomen Pelvis W Contrast  Result Date: 08/29/2018 CLINICAL DATA:  Right-sided abdominal pain and fever for 2 days. Suspected appendicitis. EXAM: CT ABDOMEN AND PELVIS WITH CONTRAST TECHNIQUE: Multidetector CT imaging of the abdomen and pelvis was performed using the standard protocol following bolus administration of intravenous contrast. CONTRAST:  ISOVUE-300 IOPAMIDOL (ISOVUE-300) INJECTION 61% COMPARISON:  07/16/2017 FINDINGS: Lower Chest: No acute findings. Hepatobiliary: No hepatic masses identified. Gallbladder is unremarkable. Pancreas:  No mass or inflammatory changes. Spleen: Within normal limits in size and appearance. Adrenals/Urinary Tract: No masses identified. No evidence of hydronephrosis. Mild diffuse bladder wall thickening is noted, suspicious for cystitis. Stomach/Bowel: No evidence of obstruction, inflammatory process or abnormal fluid collections. Normal appendix visualized. Vascular/Lymphatic: No pathologically enlarged lymph nodes. No abdominal aortic aneurysm. Reproductive:  No mass or other significant abnormality. Other:  None. Musculoskeletal:  No suspicious bone lesions identified. IMPRESSION: 1. Mild diffuse bladder wall thickening, suspicious for cystitis. Suggest correlation with urinalysis. 2. No evidence of appendicitis or other acute findings. Electronically Signed   By: Myles Rosenthal M.D.   On: 08/29/2018 14:54    Procedures Procedures (including critical care time)  CRITICAL CARE Performed by: Elisha Ponder   Total critical care time: 35 minutes  Critical care time was exclusive of separately billable procedures and treating other patients.  Critical care was necessary to treat or prevent imminent or life-threatening deterioration.  Critical care was time spent  personally by me on the following activities: development of treatment plan with patient and/or  surrogate as well as nursing, discussions with consultants, evaluation of patient's response to treatment, examination of patient, obtaining history from patient or surrogate, ordering and performing treatments and interventions, ordering and review of laboratory studies, ordering and review of radiographic studies, pulse oximetry and re-evaluation of patient's condition.   Medications Ordered in ED Medications  sodium chloride 0.9 % bolus 1,000 mL (1,000 mLs Intravenous New Bag/Given 08/29/18 1239)    And  sodium chloride 0.9 % bolus 1,000 mL (1,000 mLs Intravenous New Bag/Given 08/29/18 1240)    And  0.9 %  sodium chloride infusion ( Intravenous New Bag/Given 08/29/18 1239)  iopamidol (ISOVUE-300) 61 % injection (has no administration in time range)  sodium chloride (PF) 0.9 % injection (has no administration in time range)     Initial Impression / Assessment and Plan / ED Course  I have reviewed the triage vital signs and the nursing notes.  Pertinent labs & imaging results that were available during my care of the patient were reviewed by me and considered in my medical decision making (see chart for details).  Clinical Course as of Aug 29 1514  Sun Aug 29, 2018  1349 Indicative of DKA.   Anion gap(!): 25 [AM]  1502 No evidence of infection.   Urinalysis, Routine w reflex microscopic(!) [AM]  [id number] Patient stating that he does not want potassium through the IV.  Is amenable to oral potassium.  10 M EQ's per hour x2 doses ordered.   [AM]  1509 Mental status, BP, and MAP improving.    [AM]    Clinical Course User Index [AM] Elisha Ponder, PA-C       This is a 18 year old male with a history of type 1 diabetes and noncompliance with his insulin presenting for decreased mental status and hyperglycemia after leaving the hospital AGAINST MEDICAL ADVICE 3 days ago with a diagnosis of DKA.  My exam, patient appears significantly dehydrated, and slightly somnolent.  He will arouse to  voice.  Patient represents an worsening DKA.   Suspect that patient's DKA may be secondary to medication noncompliance, however he was significantly tender in the right lower quadrant, and is having rigors.  He is refusing rectal temperature, and feel that oral temperatures may be inaccurate.  Will assess for intra-abdominal causes of infection with CT scan.  No leukocytosis.  Work-up significant for initial CBG of 357.  Arterial blood gas demonstrates pH of 7.197.  Bicarb undetectable.  On BMP, anion gap is 25.  Glucose 439, and CO2 6.  Potassium is 4.5 and sodium is 136.  3 L of normal saline ordered in addition to the insulin.  CT scan without acute surgical abnormality specifically no appendicitis.  It did show thickened urinary bladder correlate with urinalysis.  Urinalysis is clear with no evidence of infection.  Will send culture given CT findings.  Influenza a and B are negative.  Patient had improving mental status after 2 fluid boluses.  Patient is consenting to stay in the hospital.  Patient was extensively counseled that he has a serious condition that would lead to death with out immediate intervention.  Dr. Allena Katz of [facility] hospitalists to admit.  Appreciate his involvement in the care of this patient.  This is a shared visit with Dr. Jacalyn Lefevre. Patient was independently evaluated by this attending physician. Attending physician consulted in evaluation and management.  Final Clinical Impressions(s) / ED Diagnoses  Final diagnoses:  DKA (diabetic ketoacidoses) [facility])    ED Discharge Orders    None       Delia Chimes 08/29/18 1557    Jacalyn Lefevre, MD 08/29/18 1606

## 2018-08-29 NOTE — ED Notes (Signed)
I Stat 7 given to MD and RN

## 2018-08-29 NOTE — ED Notes (Signed)
Bed: YD74 Expected date:  Expected time:  Means of arrival:  Comments: 18 yo hyperglycemia

## 2018-08-30 LAB — BASIC METABOLIC PANEL
Anion gap: 6 (ref 5–15)
BUN: <5 mg/dL — ABNORMAL LOW (ref 6–20)
CO2: 14 mmol/L — ABNORMAL LOW (ref 22–32)
Calcium: 7.4 mg/dL — ABNORMAL LOW (ref 8.9–10.3)
Chloride: 115 mmol/L — ABNORMAL HIGH (ref 98–111)
Creatinine, Ser: 0.75 mg/dL (ref 0.61–1.24)
GFR calc Af Amer: >60 mL/min (ref >60)
Glucose, Bld: 170 mg/dL — ABNORMAL HIGH (ref 70–99)
Potassium: 3.9 mmol/L (ref 3.5–5.1)
Sodium: 135 mmol/L (ref 135–145)

## 2018-08-30 LAB — GLUCOSE, CAPILLARY
GLUCOSE-CAPILLARY: 255 mg/dL — AB (ref 70–99)
Glucose-Capillary: 128 mg/dL — ABNORMAL HIGH (ref 70–99)
Glucose-Capillary: 146 mg/dL — ABNORMAL HIGH (ref 70–99)
Glucose-Capillary: 157 mg/dL — ABNORMAL HIGH (ref 70–99)
Glucose-Capillary: 162 mg/dL — ABNORMAL HIGH (ref 70–99)
Glucose-Capillary: 263 mg/dL — ABNORMAL HIGH (ref 70–99)

## 2018-08-30 NOTE — Progress Notes (Addendum)
Patient requesting to leave. I sat down at patient's bedside and educated him on the risks associated with leaving AMA. I also educated him on his current treatment plan and the importance of remaining on the insulin gtt until his anion gap and CO2 are corrected. Patient told me numerous times that he had to get home to his girlfriend and that he was hungry and wanted food. Per orders and after speaking with NP, NP denied ordering patient a diet after reviewing the patient's current labs. Patient informed me that he is unable to get his insulin after discharge, but refused to stay until I could have a case manager talk with him today about available resources. Primary RN, Glee Arvin., notified C. Bodenheimer, NP of patient's request to leave. NP aware of patient signing himself out AMA. Glee Arvin., RN walked patient off the unit.

## 2018-08-30 NOTE — Progress Notes (Signed)
I was called by [name] [name] concerning patient leaving AMA. The patient is refusing treatment; signed AMA papers. Patient walked out of ICU and RN followed behind patient to ED entrance. Patient fell in hallway by the PACU; RN requested patient seek treatment in ED; patient refuses and gets up and continues to walk out. I met patient in the hallway leading to main ED and talked with patient concerning plan of care; patient refuses treatment; expresses he no longer wishes to be here; states he is going home. I verbalized to patient the need for continued treatment; patient continued to walk out of ED with unsteady gait; oriented to person,place and time. Security alerted to patient leaving AMA and refusing treatment at this time. Patient refused to call anyone for a ride and stated, "I will walk home." RN instructed to complete safety zone and progress note. AMA forms signed and MD notified.

## 2018-08-30 NOTE — Progress Notes (Signed)
AMA Note Date: 08/30/2018 Patient: Kenlin Pietrzyk. Admitted: 08/29/2018 11:50 AM Attending Provider: C.Bodenheimer Syler [name] has made the decision for the patient to leave the intensive care unit against the advice of Stevie Kern, NP.  He has been informed and understands the inherent risks, including death.  He has decided to accept the responsibility for this decision. [name] and all necessary parties have been advised that he may return for further evaluation or treatment. His condition at time of discharge was Critical.  [name] had current vital signs as follows:  Blood pressure (!) 129/44, pulse 91, temperature 97.6 F (36.4 C), temperature source Oral, resp. rate 16, SpO2 98 %.   [name] or his authorized caregiver has signed the Leaving Against Medical Advice form prior to leaving the department.  Ricka Burdock 08/30/2018

## 2018-08-31 ENCOUNTER — Inpatient Hospital Stay: Payer: Self-pay

## 2018-08-31 ENCOUNTER — Inpatient Hospital Stay (HOSPITAL_COMMUNITY)
Admission: EM | Admit: 2018-08-31 | Discharge: 2018-09-06 | DRG: 638 | Disposition: A | Discharge status: Home / Self Care | Payer: Medicaid Other | Attending: Family Medicine | Admitting: Family Medicine

## 2018-08-31 ENCOUNTER — Other Ambulatory Visit: Payer: Self-pay

## 2018-08-31 ENCOUNTER — Encounter (HOSPITAL_COMMUNITY): Payer: Self-pay

## 2018-08-31 DIAGNOSIS — E10649 Type 1 diabetes mellitus with hypoglycemia without coma: Secondary | ICD-10-CM | POA: Diagnosis not present

## 2018-08-31 DIAGNOSIS — Z915 Personal history of self-harm: Secondary | ICD-10-CM | POA: Diagnosis not present

## 2018-08-31 DIAGNOSIS — Z9119 Patient's noncompliance with other medical treatment and regimen: Secondary | ICD-10-CM | POA: Diagnosis not present

## 2018-08-31 DIAGNOSIS — Z825 Family history of asthma and other chronic lower respiratory diseases: Secondary | ICD-10-CM

## 2018-08-31 DIAGNOSIS — Z046 Encounter for general psychiatric examination, requested by authority: Secondary | ICD-10-CM

## 2018-08-31 DIAGNOSIS — Z9114 Patient's other noncompliance with medication regimen: Secondary | ICD-10-CM

## 2018-08-31 DIAGNOSIS — Z8249 Family history of ischemic heart disease and other diseases of the circulatory system: Secondary | ICD-10-CM | POA: Diagnosis not present

## 2018-08-31 DIAGNOSIS — R45851 Suicidal ideations: Secondary | ICD-10-CM | POA: Diagnosis present

## 2018-08-31 DIAGNOSIS — Z794 Long term (current) use of insulin: Secondary | ICD-10-CM

## 2018-08-31 DIAGNOSIS — F4325 Adjustment disorder with mixed disturbance of emotions and conduct: Secondary | ICD-10-CM | POA: Diagnosis present

## 2018-08-31 DIAGNOSIS — Z833 Family history of diabetes mellitus: Secondary | ICD-10-CM | POA: Diagnosis not present

## 2018-08-31 DIAGNOSIS — E1010 Type 1 diabetes mellitus with ketoacidosis without coma: Secondary | ICD-10-CM | POA: Diagnosis present

## 2018-08-31 DIAGNOSIS — E1110 Type 2 diabetes mellitus with ketoacidosis without coma: Secondary | ICD-10-CM | POA: Diagnosis present

## 2018-08-31 LAB — URINALYSIS, ROUTINE W REFLEX MICROSCOPIC
Bacteria, UA: NONE SEEN
Bilirubin Urine: NEGATIVE
Glucose, UA: >=500 mg/dL — AB
HGB URINE DIPSTICK: NEGATIVE
Ketones, ur: 80 mg/dL — AB
Leukocytes,Ua: NEGATIVE
Nitrite: NEGATIVE
Protein, ur: NEGATIVE mg/dL
Specific Gravity, Urine: 1.017 (ref 1.005–1.030)
pH: 6 (ref 5.0–8.0)

## 2018-08-31 LAB — CBC
HCT: 37.9 % — ABNORMAL LOW (ref 39.0–52.0)
Hemoglobin: 13 g/dL (ref 13.0–17.0)
MCH: 28.9 pg (ref 26.0–34.0)
MCHC: 34.3 g/dL (ref 30.0–36.0)
MCV: 84.2 fL (ref 80.0–100.0)
Platelets: 276 10*3/uL (ref 150–400)
RBC: 4.5 MIL/uL (ref 4.22–5.81)
RDW: 13.2 % (ref 11.5–15.5)
WBC: 7.5 10*3/uL (ref 4.0–10.5)
nRBC: 0 % (ref 0.0–0.2)

## 2018-08-31 LAB — BASIC METABOLIC PANEL
Anion gap: 9 (ref 5–15)
Anion gap: 18 — ABNORMAL HIGH (ref 5–15)
BUN: 5 mg/dL — ABNORMAL LOW (ref 6–20)
BUN: 18 mg/dL (ref 6–20)
CHLORIDE: 96 mmol/L — AB (ref 98–111)
CO2: 17 mmol/L — ABNORMAL LOW (ref 22–32)
CO2: 18 mmol/L — AB (ref 22–32)
CREATININE: 0.74 mg/dL (ref 0.61–1.24)
Calcium: 7 mg/dL — ABNORMAL LOW (ref 8.9–10.3)
Calcium: 8.7 mg/dL — ABNORMAL LOW (ref 8.9–10.3)
Chloride: 109 mmol/L (ref 98–111)
Creatinine, Ser: 0.52 mg/dL — ABNORMAL LOW (ref 0.61–1.24)
GFR calc Af Amer: >60 mL/min (ref >60)
GFR calc non Af Amer: >60 mL/min (ref >60)
GFR calc non Af Amer: >60 mL/min (ref >60)
Glucose, Bld: 334 mg/dL — ABNORMAL HIGH (ref 70–99)
Glucose, Bld: 491 mg/dL — ABNORMAL HIGH (ref 70–99)
Potassium: 3 mmol/L — ABNORMAL LOW (ref 3.5–5.1)
Potassium: 3.9 mmol/L (ref 3.5–5.1)
SODIUM: 135 mmol/L (ref 135–145)
Sodium: 132 mmol/L — ABNORMAL LOW (ref 135–145)

## 2018-08-31 LAB — BLOOD GAS, VENOUS
Acid-base deficit: 6.3 mmol/L — ABNORMAL HIGH (ref 0.0–2.0)
Bicarbonate: 17.1 mmol/L — ABNORMAL LOW (ref 20.0–28.0)
FIO2: 21
O2 Saturation: 94.4 %
Patient temperature: 100
pCO2, Ven: 31.1 mmHg — ABNORMAL LOW (ref 44.0–60.0)
pH, Ven: 7.363 (ref 7.250–7.430)
pO2, Ven: 74.8 mmHg — ABNORMAL HIGH (ref 32.0–45.0)

## 2018-08-31 LAB — RAPID URINE DRUG SCREEN, HOSP PERFORMED
Amphetamines: NOT DETECTED
Barbiturates: NOT DETECTED
Benzodiazepines: NOT DETECTED
Cocaine: NOT DETECTED
OPIATES: NOT DETECTED
Tetrahydrocannabinol: NOT DETECTED

## 2018-08-31 LAB — GLUCOSE, CAPILLARY
GLUCOSE-CAPILLARY: 558 mg/dL — AB (ref 70–99)
Glucose-Capillary: 250 mg/dL — ABNORMAL HIGH (ref 70–99)
Glucose-Capillary: 184 mg/dL — ABNORMAL HIGH (ref 70–99)
Glucose-Capillary: 234 mg/dL — ABNORMAL HIGH (ref 70–99)
Glucose-Capillary: 235 mg/dL — ABNORMAL HIGH (ref 70–99)
Glucose-Capillary: 294 mg/dL — ABNORMAL HIGH (ref 70–99)
Glucose-Capillary: 354 mg/dL — ABNORMAL HIGH (ref 70–99)
Glucose-Capillary: 552 mg/dL (ref 70–99)
Glucose-Capillary: 483 mg/dL — ABNORMAL HIGH (ref 70–99)
Glucose-Capillary: 469 mg/dL — ABNORMAL HIGH (ref 70–99)
Glucose-Capillary: 388 mg/dL — ABNORMAL HIGH (ref 70–99)
Glucose-Capillary: 440 mg/dL — ABNORMAL HIGH (ref 70–99)
Glucose-Capillary: 374 mg/dL — ABNORMAL HIGH (ref 70–99)
Glucose-Capillary: 315 mg/dL — ABNORMAL HIGH (ref 70–99)

## 2018-08-31 LAB — COMPREHENSIVE METABOLIC PANEL
ALT: 21 U/L (ref 0–44)
AST: 16 U/L (ref 15–41)
Albumin: 3.7 g/dL (ref 3.5–5.0)
Alkaline Phosphatase: 92 U/L (ref 38–126)
Anion gap: 19 — ABNORMAL HIGH (ref 5–15)
BUN: 9 mg/dL (ref 6–20)
CO2: 15 mmol/L — ABNORMAL LOW (ref 22–32)
Calcium: 8.7 mg/dL — ABNORMAL LOW (ref 8.9–10.3)
Chloride: 99 mmol/L (ref 98–111)
Creatinine, Ser: 0.81 mg/dL (ref 0.61–1.24)
GFR calc non Af Amer: >60 mL/min (ref >60)
Glucose, Bld: 364 mg/dL — ABNORMAL HIGH (ref 70–99)
Potassium: 3 mmol/L — ABNORMAL LOW (ref 3.5–5.1)
Sodium: 133 mmol/L — ABNORMAL LOW (ref 135–145)
Total Bilirubin: 2.2 mg/dL — ABNORMAL HIGH (ref 0.3–1.2)
Total Protein: 6.8 g/dL (ref 6.5–8.1)

## 2018-08-31 LAB — CBG MONITORING, ED
Glucose-Capillary: 344 mg/dL — ABNORMAL HIGH (ref 70–99)
Glucose-Capillary: 273 mg/dL — ABNORMAL HIGH (ref 70–99)
Glucose-Capillary: 170 mg/dL — ABNORMAL HIGH (ref 70–99)
Glucose-Capillary: 160 mg/dL — ABNORMAL HIGH (ref 70–99)
Glucose-Capillary: 207 mg/dL — ABNORMAL HIGH (ref 70–99)
Glucose-Capillary: 297 mg/dL — ABNORMAL HIGH (ref 70–99)

## 2018-08-31 LAB — ETHANOL: Alcohol, Ethyl (B): <10 mg/dL (ref <10)

## 2018-08-31 LAB — MAGNESIUM: Magnesium: 1.7 mg/dL (ref 1.7–2.4)

## 2018-08-31 LAB — BETA-HYDROXYBUTYRIC ACID: Beta-Hydroxybutyric Acid: 5.67 mmol/L — ABNORMAL HIGH (ref 0.05–0.27)

## 2018-08-31 MED ORDER — DOCUSATE SODIUM 100 MG PO CAPS: 100.0000 mg | ORAL_CAPSULE | Freq: Every day | ORAL | Status: DC

## 2018-08-31 MED ORDER — TRAZODONE HCL 100 MG PO TABS
100.0000 mg | ORAL_TABLET | Freq: Every evening | ORAL | Status: DC | PRN
  Administered 2018-09-03 – 2018-09-06 (×3): 100 mg via ORAL
  Filled 2018-08-31 (×3): qty 1

## 2018-08-31 MED ORDER — POTASSIUM CHLORIDE 10 MEQ/100ML IV SOLN: 10.0000 meq | INTRAVENOUS | Status: DC

## 2018-08-31 MED ORDER — DEXTROSE-NACL 5-0.45 % IV SOLN
INTRAVENOUS | Status: DC
  Administered 2018-09-01 (×3): via INTRAVENOUS

## 2018-08-31 MED ORDER — SODIUM CHLORIDE 0.9 % IV BOLUS
2000.0000 mL | Freq: Once | INTRAVENOUS | Status: AC
  Administered 2018-08-31: 2000 mL via INTRAVENOUS

## 2018-08-31 MED ORDER — HYDROXYZINE HCL 25 MG PO TABS
100.0000 mg | ORAL_TABLET | Freq: Every day | ORAL | Status: DC | PRN
  Administered 2018-09-02: 100 mg via ORAL
  Filled 2018-08-31: qty 4

## 2018-08-31 MED ORDER — POTASSIUM CHLORIDE 10 MEQ/100ML IV SOLN: 10.0000 meq | INTRAVENOUS | Status: AC

## 2018-08-31 MED ORDER — SODIUM CHLORIDE 0.9 % IV SOLN
INTRAVENOUS | Status: DC
  Administered 2018-08-31: 125 mL via INTRAVENOUS

## 2018-08-31 MED ORDER — INSULIN REGULAR(HUMAN) IN NACL 100-0.9 UT/100ML-% IV SOLN
INTRAVENOUS | Status: DC
  Administered 2018-08-31: 3.3 [IU]/h via INTRAVENOUS
  Filled 2018-08-31 (×2): qty 100

## 2018-08-31 MED ORDER — ALUM & MAG HYDROXIDE-SIMETH 200-200-20 MG/5ML PO SUSP
30.0000 mL | Freq: Once | ORAL | Status: AC
  Administered 2018-08-31: 30 mL via ORAL
  Filled 2018-08-31: qty 30

## 2018-08-31 MED ORDER — INSULIN REGULAR(HUMAN) IN NACL 100-0.9 UT/100ML-% IV SOLN: INTRAVENOUS | Status: DC

## 2018-08-31 MED ORDER — DEXTROSE-NACL 5-0.45 % IV SOLN: INTRAVENOUS | Status: DC

## 2018-08-31 MED ORDER — INSULIN ASPART 100 UNIT/ML ~~LOC~~ SOLN: 0.0000 [IU] | Freq: Three times a day (TID) | SUBCUTANEOUS | Status: DC

## 2018-08-31 MED ORDER — LACTATED RINGERS IV SOLN: INTRAVENOUS | Status: DC

## 2018-08-31 MED ORDER — POTASSIUM CHLORIDE 10 MEQ/100ML IV SOLN
10.0000 meq | INTRAVENOUS | Status: AC
  Administered 2018-08-31 (×3): 10 meq via INTRAVENOUS
  Filled 2018-08-31 (×3): qty 100

## 2018-08-31 MED ORDER — POTASSIUM CHLORIDE CRYS ER 20 MEQ PO TBCR: 40.0000 meq | EXTENDED_RELEASE_TABLET | Freq: Once | ORAL | Status: DC

## 2018-08-31 MED ORDER — CHLORHEXIDINE GLUCONATE CLOTH 2 % EX PADS
6.0000 | MEDICATED_PAD | Freq: Every day | CUTANEOUS | Status: DC
  Administered 2018-09-04 – 2018-09-05 (×2): 6 via TOPICAL

## 2018-08-31 MED ORDER — SODIUM CHLORIDE 0.9% FLUSH
10.0000 mL | Freq: Two times a day (BID) | INTRAVENOUS | Status: DC
  Administered 2018-08-31 – 2018-09-06 (×8): 10 mL

## 2018-08-31 MED ORDER — ENOXAPARIN SODIUM 40 MG/0.4ML ~~LOC~~ SOLN: 40.0000 mg | SUBCUTANEOUS | Status: DC

## 2018-08-31 MED ORDER — INSULIN GLARGINE 100 UNIT/ML ~~LOC~~ SOLN
30.0000 [IU] | Freq: Every day | SUBCUTANEOUS | Status: DC
  Filled 2018-08-31: qty 0.3

## 2018-08-31 MED ORDER — ACETAMINOPHEN 325 MG PO TABS: 650.0000 mg | ORAL_TABLET | Freq: Four times a day (QID) | ORAL | Status: DC | PRN

## 2018-08-31 MED ORDER — LIDOCAINE VISCOUS HCL 2 % MT SOLN
15.0000 mL | Freq: Once | OROMUCOSAL | Status: DC
  Filled 2018-08-31: qty 15

## 2018-08-31 MED ORDER — ONDANSETRON HCL 4 MG/2ML IJ SOLN: 4.0000 mg | Freq: Four times a day (QID) | INTRAMUSCULAR | Status: DC | PRN

## 2018-08-31 MED ORDER — INSULIN REGULAR(HUMAN) IN NACL 100-0.9 UT/100ML-% IV SOLN
INTRAVENOUS | Status: DC
  Administered 2018-08-31: 3 [IU]/h via INTRAVENOUS
  Filled 2018-08-31: qty 100

## 2018-08-31 MED ORDER — SODIUM CHLORIDE 0.9 % IV BOLUS
1000.0000 mL | Freq: Once | INTRAVENOUS | Status: AC
  Administered 2018-08-31: 1000 mL via INTRAVENOUS

## 2018-08-31 MED ORDER — POTASSIUM CHLORIDE 10 MEQ/100ML IV SOLN
10.0000 meq | INTRAVENOUS | Status: AC
  Administered 2018-08-31 (×4): 10 meq via INTRAVENOUS
  Filled 2018-08-31 (×4): qty 100

## 2018-08-31 MED ORDER — SODIUM CHLORIDE 0.9% FLUSH: 10.0000 mL | INTRAVENOUS | Status: DC | PRN

## 2018-08-31 MED ORDER — DEXTROSE-NACL 5-0.45 % IV SOLN
INTRAVENOUS | Status: DC
  Administered 2018-08-31: 06:00:00 via INTRAVENOUS

## 2018-08-31 MED ORDER — SODIUM CHLORIDE 0.9 % IV SOLN
INTRAVENOUS | Status: DC
  Administered 2018-08-31: 06:00:00 via INTRAVENOUS

## 2018-08-31 MED ORDER — POTASSIUM CHLORIDE 10 MEQ/100ML IV SOLN
10.0000 meq | INTRAVENOUS | Status: AC
  Administered 2018-08-31: 10 meq via INTRAVENOUS
  Filled 2018-08-31: qty 100

## 2018-08-31 NOTE — H&P (Addendum)
[facility] History and Physical   Patient: [name] [name]. EKC:[id number]   PCP: Patient, No Pcp Per DOB: 03-Jan-2001   DOA: 08/31/2018   DOS: 08/31/2018   DOS: the patient was seen and examined on 08/31/2018  Patient coming from: The patient is coming from home.  Chief Complaint: Suicidal ideation.  HPI: [name] [name]. is a 18 y.o. male with Past medical history of type I DM, seizures, anxiety, ADHD, depression. Patient was recently hospitalized for DKA and left AMA. History was obtained from EDP documentation as the patient is nonverbal and will not communicate with me. Reportedly patient has made multiple threats to harm himself including jumping in the traffic, hanging himself, attempted to take large monitor pills.  Patient is definitely not taking his diabetes management seriously with presentation with recurrent DKA and leaving AMA. Patient's cousin prepared IVC paperwork and police brought the patient to the hospital for evaluation. Patient is noncooperative, will not participate in for any HPI.  ED Course: Started on IV insulin.  Referred for admission for suicidal ideation as well as DKA.  At his baseline ambulates without any support And is independent for most of his ADL; manages his medication on his own.  Review of Systems: as mentioned in the history of present illness.  All other systems reviewed and are negative.  Past Medical History:  Diagnosis Date  . ADD (attention deficit disorder)   . Allergy   . Anxiety   . Asthma   . Depression   . Epileptic seizure (HCC)    "both petit and grand mal; last sz ~ 2 wk ago" (06/17/2013)  . Headache(784.0)    "2-3 times/wk usually" (06/17/2013)  . Heart murmur    "heard a slight one earlier today" (06/17/2013)  . Homeless   . Migraine    "maybe once/month; it's severe" (06/17/2013)  . ODD (oppositional defiant disorder)   . Sickle cell trait (HCC)   . Type I diabetes mellitus (HCC)    "that's what they're  thinking now" (06/17/2013)  . Vision abnormalities    "takes him longer to focus cause; from his sz" (06/17/2013)   Past Surgical History:  Procedure Laterality Date  . CIRCUMCISION  2000  . FINGER SURGERY Left 2001   "crushed pinky; had to repair it" (06/17/2013)   Social History:  reports that he is a non-smoker but has been exposed to tobacco smoke. He has never used smokeless tobacco. He reports that he does not drink alcohol or use drugs.  Allergies  Allergen Reactions  . Bee Venom Anaphylaxis  . Fish Allergy Anaphylaxis  . Shellfish Allergy Anaphylaxis  . Tea Anaphylaxis   Family History  Problem Relation Age of Onset  . Asthma Mother   . COPD Mother   . Other Mother        possible autoimmune, unclear  . Diabetes Maternal Grandmother   . Heart disease Maternal Grandmother   . Hypertension Maternal Grandmother   . Mental illness Maternal Grandmother   . Heart disease Maternal Grandfather   . Hyperlipidemia Paternal Grandmother   . Hyperlipidemia Paternal Grandfather   . Migraines Sister        Hemiplegic Migraines      Prior to Admission medications   Medication Sig Start Date End Date Taking? Authorizing Provider  glucose blood (ACCU-CHEK GUIDE) test strip Check glucose 6x daily: For blood sugar checks 03/22/18  Yes Long, Arlyss Repress, MD  albuterol (PROVENTIL HFA;VENTOLIN HFA) 108 (90 Base) MCG/ACT inhaler Inhale  1 puff into the lungs every 4 (four) hours as needed for wheezing or shortness of breath.    [provider]  hydrOXYzine (ATARAX/VISTARIL) 50 MG tablet Take 100 mg by mouth daily as needed for anxiety.     [provider]  insulin aspart (NOVOLOG FLEXPEN) 100 UNIT/ML FlexPen Inject 0-15 Units into the skin 3 (three) times daily with meals. (Sliding scale): For diabetes management 03/09/18   Armandina Stammer I, NP  insulin glargine (LANTUS) 100 UNIT/ML injection Inject 0.3 mLs (30 Units total) into the skin 2 (two) times daily. 07/12/18   Ward,  Kristen N, DO  SUBOXONE 8-2 MG FILM Place 1 Film under the tongue 2 (two) times daily. 08/11/18   [provider]  traZODone (DESYREL) 50 MG tablet Take 100 mg by mouth at bedtime as needed for sleep.     [provider]    Physical Exam: Vitals:   08/31/18 0800 08/31/18 0900 08/31/18 1300 08/31/18 1735  BP: 108/74 130/68    Pulse:  83    Resp: 20     Temp:   98.7 F (37.1 C) 98.5 F (36.9 C)  TempSrc:   Axillary Oral  SpO2: 99% 100%  98%  Weight:      Height:        General: Nonverbal, flat affect, not following any commands, not participating in care, uncooperative with exam Eyes: PERRL, Neck: difficult to assess JVD, no Abnormal Mass Or lumps Cardiovascular: S1 and S2 Present, no Murmur,  Respiratory: increased respiratory effort, Bilateral Air entry equal and Decreased, no use of accessory muscle, Clear to Auscultation, no Crackles, no wheezes Abdomen: Bowel Sound present, Skin: no redness, no Rash, no induration Extremities: No visible edema Neurologic: Grossly no focal neuro deficit.  Moving all extremities appropriately.  Labs on Admission:  CBC: Recent Labs  Lab 08/26/18 1229 08/26/18 1255 08/29/18 1224 08/29/18 1321 08/31/18 0134  WBC 6.3  --  8.0  --  7.5  NEUTROABS 4.3  --  5.9  --   --   HGB 14.7 14.6 13.0 11.9* 13.0  HCT 44.8 43.0 39.6 35.0* 37.9*  MCV 85.7  --  88.8  --  84.2  PLT 297  --  268  --  276   Basic Metabolic Panel: Recent Labs  Lab 08/29/18 1224 08/29/18 1321 08/29/18 1330 08/29/18 1734 08/30/18 0104 08/31/18 0134 08/31/18 0752 08/31/18 0754  NA 136 136  --  138 135 133* 135  --   K 4.5 4.7  --  4.9 3.9 3.0* 3.0*  --   CL 105  --   --  114* 115* 99 109  --   CO2 6*  --   --  7* 14* 15* 17*  --   GLUCOSE 439*  --   --  191* 170* 364* 334*  --   BUN 10  --   --  7 <5* 9 5*  --   CREATININE 1.14  --  0.60* 1.00 0.75 0.81 0.52*  --   CALCIUM 7.9*  --   --  7.5* 7.4* 8.7* 7.0*  --   MG  --   --   --   --   --   --    --  1.7   GFR: Estimated Creatinine Clearance: 168.3 mL/min (A) (by C-G formula based on SCr of 0.52 mg/dL (L)). Liver Function Tests: Recent Labs  Lab 08/26/18 1229 08/29/18 1224 08/31/18 0134  AST 15 14* 16  ALT 47* 28  21  ALKPHOS 93 101 92  BILITOT 1.5* 1.9* 2.2*  PROT 6.4* 6.5 6.8  ALBUMIN 3.6 3.8 3.7   No results for input(s): LIPASE, AMYLASE in the last 168 hours. No results for input(s): AMMONIA in the last 168 hours. Coagulation Profile: No results for input(s): INR, PROTIME in the last 168 hours. Cardiac Enzymes: No results for input(s): CKTOTAL, CKMB, CKMBINDEX, TROPONINI in the last 168 hours. BNP (last 3 results) No results for input(s): PROBNP in the last 8760 hours. HbA1C: No results for input(s): HGBA1C in the last 72 hours. CBG: Recent Labs  Lab 08/31/18 1430 08/31/18 1559 08/31/18 1637 08/31/18 1740 08/31/18 1832  GLUCAP 354* 558* 552* 483* 469*   Lipid Profile: No results for input(s): CHOL, HDL, LDLCALC, TRIG, CHOLHDL, LDLDIRECT in the last 72 hours. Thyroid Function Tests: No results for input(s): TSH, T4TOTAL, FREET4, T3FREE, THYROIDAB in the last 72 hours. Anemia Panel: No results for input(s): VITAMINB12, FOLATE, FERRITIN, TIBC, IRON, RETICCTPCT in the last 72 hours. Urine analysis:    Component Value Date/Time   COLORURINE STRAW (A) 08/31/2018 0736   APPEARANCEUR CLEAR 08/31/2018 0736   LABSPEC 1.017 08/31/2018 0736   PHURINE 6.0 08/31/2018 0736   GLUCOSEU >=500 (A) 08/31/2018 0736   HGBUR NEGATIVE 08/31/2018 0736   BILIRUBINUR NEGATIVE 08/31/2018 0736   KETONESUR 80 (A) 08/31/2018 0736   PROTEINUR NEGATIVE 08/31/2018 0736   UROBILINOGEN 0.2 03/13/2015 0123   NITRITE NEGATIVE 08/31/2018 0736   LEUKOCYTESUR NEGATIVE 08/31/2018 0736    Radiological Exams on Admission: [place] Ekg Site Rite  Result Date: 08/31/2018 If Site Rite image not attached, placement could not be confirmed due to current cardiac rhythm.  Assessment/Plan 1.  Suicidal ideations Resents with suicide ideation. IVC currently. Has left hospital multiple times AMA. Definitely has poor insight in his medical condition. Psychiatry consulted. I definitely feel the patient requires inpatient psychiatric hospitalization based on my prior experience. As refused care multiple times during this stay as well. Continue 1 is to 1 monitoring and suicide precaution.  2.  DKA. Patient presented with AMS. Currently back to his baseline. Refusing care multiple times. At present with the help of the diabetes coordinator patient is currently agreeable for PICC line placement. We will recheck BMP and initiate DKA protocol again. Continue close monitoring of his sugars. Continue IV insulin and IV fluids. Highly appreciate diabetes coordinator's assistance in guiding this patient.  Nutrition: Carb modified diet, to improve patient's compliance with current care. DVT Prophylaxis: subcutaneous Heparin  Advance goals of care discussion: full code   Consults: Psychiatry  Family Communication: no family was present at bedside, at the time of interview.  Disposition: Admitted as inpatient, step-down unit.  Author: Lynden Oxford, MD [facility] 08/31/2018  To reach On-call, see care teams to locate the attending and reach out to them via [email or web address]. If 7PM-7AM, please contact night-coverage If you still have difficulty reaching the attending provider, please page the [facility] (Director on Call) for [facility] on amion for assistance.

## 2018-08-31 NOTE — ED Notes (Signed)
Patient reports to this RN that his parent's got deported last week. Pt states his dad is from [place] and his mom is from [place]. Patient reports he does not want to go back to the second floor because they would not feed him and he does not want to go to behavioral health. Patient reports he was sent to [facility] at 18 years old after attempting to jump in front of a car.

## 2018-08-31 NOTE — Care Management (Signed)
This is a no charge note  Pending admission per PA, Ala Dach  18 year old man with past medical history of type 1 diabetes, seizure, depression, ADD, asthma, who presents with DKA and suicidal ideation and attempt.  Blood sugar 364, anion gap 19.  Patient was admitted to stepdown on 3/1, but patient left the hospital on AMA.   Patient was found to have WBC 7.5, potassium 3.0, renal function okay, temperature 100, tachycardia, tachypnea, oxygen saturation 100% on room air.  Pending urinalysis.  Will get blood culture.  Patient is admitted to stepdown as inpatient.  DKA protocol is started. Pt is IVC'ed by cousin today.     Lorretta Harp, MD  [facility]   If 7PM-7AM, please contact night-coverage [email or web address] Password [facility] 08/31/2018, 4:33 AM

## 2018-08-31 NOTE — ED Triage Notes (Signed)
Pt is IVC'd by his cousin, states he's not taking his  meds, has tried to jump in traffic, hang himself and attempted to take a large quantity of pills

## 2018-08-31 NOTE — ED Notes (Signed)
Bed: WTR6 Expected date:  Expected time:  Means of arrival:  Comments: 

## 2018-08-31 NOTE — Progress Notes (Signed)
Peripherally Inserted Central Catheter/Midline Placement  The IV Nurse has discussed with the patient and/or persons authorized to consent for the patient, the purpose of this procedure and the potential benefits and risks involved with this procedure.  The benefits include less needle sticks, lab draws from the catheter, and the patient may be discharged home with the catheter. Risks include, but not limited to, infection, bleeding, blood clot (thrombus formation), and puncture of an artery; nerve damage and irregular heartbeat and possibility to perform a PICC exchange if needed/ordered by physician.  Alternatives to this procedure were also discussed.  Bard Power PICC patient education guide, fact sheet on infection prevention and patient information card has been provided to patient /or left at bedside.  PICC inserted by Stacie Glaze, RN   PICC/Midline Placement Documentation  PICC Double Lumen 08/31/18 PICC Right Basilic 39 cm 0 cm (Active)  Indication for Insertion or Continuance of Line Poor Vasculature-patient has had multiple peripheral attempts or PIVs lasting less than 24 hours 08/31/2018  6:33 PM  Exposed Catheter (cm) 0 cm 08/31/2018  6:33 PM  Site Assessment Clean;Dry;Intact 08/31/2018  6:33 PM  Lumen #1 Status Flushed;Saline locked;Blood return noted 08/31/2018  6:33 PM  Lumen #2 Status Flushed;Saline locked;Blood return noted 08/31/2018  6:33 PM  Dressing Type Transparent 08/31/2018  6:33 PM  Dressing Status Clean;Dry;Intact 08/31/2018  6:33 PM  Dressing Intervention New dressing 08/31/2018  6:33 PM  Dressing Change Due 09/07/18 08/31/2018  6:33 PM       [name] [name], [name] [name] 08/31/2018, 6:34 PM

## 2018-08-31 NOTE — Progress Notes (Signed)
VAST RN attempted IV placement X2; both attempts unsuccessful. Attempted to look at vessels with [place], but pt refused. Reported to unit RN.

## 2018-08-31 NOTE — ED Notes (Signed)
Pt was treated here this weekend for DKA

## 2018-08-31 NOTE — Progress Notes (Signed)
Inpatient Diabetes Program Recommendations  AACE/ADA: New Consensus Statement on Inpatient Glycemic Control (2015)  Target Ranges:  Prepandial:   less than 140 mg/dL      Peak postprandial:   less than 180 mg/dL (1-2 hours)      Critically ill patients:  140 - 180 mg/dL   Lab Results  Component Value Date   GLUCAP 354 (H) 08/31/2018   HGBA1C 11.5 (H) 08/09/2018    Review of Glycemic Control  Diabetes history: DM1 Outpatient Diabetes medications: Lantus 30 units bid, Novolog 0-15 units tidwc Current orders for Inpatient glycemic control: Lantus 30 units QD, Novolog 0-15 units tidwc  HgbA1C - 11.5% Awaiting BMET 235, 294, 354 mg/dL.  Inpatient Diabetes Program Recommendations:     Will need IV insulin per DKA protocol if BMET indicates DKA.  Spoke with pt at length about communicating with staff. Pt has been non-verbal since being in ICU. Pt well-known to this Diabetes Coordinator from previous admissions. States he does not have insulin at home and cannot get transportation to pharmacy. Appears very depressed and says "Nobody cares about me." Will continue to follow while inpatient.  Thank you. Ailene Ards, RD, LDN, CDE Inpatient Diabetes Coordinator [phone]

## 2018-08-31 NOTE — Progress Notes (Signed)
Pateint admitted to stepdown unit, on admission noted linear scabbing on left arm, patient not responding to questions on suicidal ideations or plans. Patient very withdrawn borderline catatonic, sitter at bedside due to IVC status and room set up as if suicide precautions. Will continue to reassess as appropriate. Patient in paper scrubs and all cards either removed or aped up as appropriate.

## 2018-08-31 NOTE — Consult Note (Signed)
[facility] - Inpatient Face-to-Face Psychiatry Consult   Reason for Consult:  "pt with NO insight of his medical condition and now IVC, has presented multiple times with life threatning DKA and will leave AMA multiple times before finishing treatment/arranging resources to help manage DM outpatient. reports no suicidal ideation." Referring Physician:  Dr. Allena Katz Patient Identification: [name] [name]. MRN:  [id number] Principal Diagnosis: Suicidal ideations Diagnosis:  Principal Problem:   Suicidal ideations Active Problems:   DKA (diabetic ketoacidoses) (HCC)   Total Time spent with patient: 1 hour  Subjective:   [name] [name]. is a 18 y.o. male patient admitted with DKA.  HPI:   Per chart review, patient was admitted with DKA. He has a history of multiple admissions for similar presentation and has left AMA several times. He was last discharged on 3/2 for DKA and left AMA. He endorsed SI and suicide attempt on admission. He reports that both his parents were deported last week. His mother is from [place] and his father is from [place] He was IVC'd by his cousin because he tried to jump into traffic, hang himself and take a large quantity of pills. He has not been taking his medications. UDS was negative on admission. Of note, he was last seen by the psychiatry service in 02/2018 for suicide attempt by Thorazine overdose in the setting of a breakup with his girlfriend. He denied a suicide attempt and reported inadvertently taking the wrong dosage of his medication. Home medications include Atarax 100 mg daily PRN for anxiety, Suboxone 8-2 mg BID and Trazodone 100 mg qhs PRN. He was discharged from [facility] in 02/2018 on Prozac 20 mg daily, Atarax 50 mg q 6 hours PRN, Thorazine 100 mg qhs and Trazodone 100 mg qhs PRN.     [name] [name] will not participate in interview and his head is under the covers. He has been selectively mute according to his nurse. He will only speak to the diabetes coordinator. He has a  good relationship with her and she has worked with him for a few years. He reportedly has several stressors. His girlfriend is pregnant. His girlfriend recently found out that he already had a baby when he was 89 years old but no further information is available about this child. His mother and father were deported a week ago. He threatened to harm his cousin after he was IVC'd. He was heard speaking to the diabetes coordinator when this notewriter left the room. He mentioned that he was sad.   Past Psychiatric History: recurrent MDD with psychosis, personality disorder, ADHD, schizophrenia  Risk to Self:  Yes. Patient was IVC'd for attempting suicide.  Risk to Others:  Yes. Patient has reportedly been agitated and threatened to harm his cousin. Prior Inpatient Therapy:  He was last hospitalized at [facility] in 02/2018 for depression with psychosis.  Prior Outpatient Therapy:  [name]  Past Medical History:  Past Medical History:  Diagnosis Date  . ADD (attention deficit disorder)   . Allergy   . Anxiety   . Asthma   . Depression   . Epileptic seizure (HCC)    "both petit and grand mal; last sz ~ 2 wk ago" (06/17/2013)  . Headache(784.0)    "2-3 times/wk usually" (06/17/2013)  . Heart murmur    "heard a slight one earlier today" (06/17/2013)  . Homeless   . Migraine    "maybe once/month; it's severe" (06/17/2013)  . ODD (oppositional defiant disorder)   . Sickle cell trait (HCC)   .  Type I diabetes mellitus (HCC)    "that's what they're thinking now" (06/17/2013)  . Vision abnormalities    "takes him longer to focus cause; from his sz" (06/17/2013)    Past Surgical History:  Procedure Laterality Date  . CIRCUMCISION  2000  . FINGER SURGERY Left 2001   "crushed pinky; had to repair it" (06/17/2013)   Family History:  Family History  Problem Relation Age of Onset  . Asthma Mother   . COPD Mother   . Other Mother        possible autoimmune, unclear  . Diabetes Maternal Grandmother    . Heart disease Maternal Grandmother   . Hypertension Maternal Grandmother   . Mental illness Maternal Grandmother   . Heart disease Maternal Grandfather   . Hyperlipidemia Paternal Grandmother   . Hyperlipidemia Paternal Grandfather   . Migraines Sister        Hemiplegic Migraines    Family Psychiatric  History: As listed above.  Social History:  Social History   Substance and Sexual Activity  Alcohol Use No  . Alcohol/week: 0.0 standard drinks     Social History   Substance and Sexual Activity  Drug Use No    Social History   Socioeconomic History  . Marital status: Single    Spouse name: Not on file  . Number of children: Not on file  . Years of education: Not on file  . Highest education level: Not on file  Occupational History  . Not on file  Social Needs  . Financial resource strain: Not on file  . Food insecurity:    Worry: Not on file    Inability: Not on file  . Transportation needs:    Medical: Not on file    Non-medical: Not on file  Tobacco Use  . Smoking status: Passive Smoke Exposure - Never Smoker  . Smokeless tobacco: Never Used  . Tobacco comment: Mom and dad smoke outside   Substance and Sexual Activity  . Alcohol use: No    Alcohol/week: 0.0 standard drinks  . Drug use: No  . Sexual activity: Never  Lifestyle  . Physical activity:    Days per week: Not on file    Minutes per session: Not on file  . Stress: Not on file  Relationships  . Social connections:    Talks on phone: Not on file    Gets together: Not on file    Attends religious service: Not on file    Active member of club or organization: Not on file    Attends meetings of clubs or organizations: Not on file    Relationship status: Not on file  Other Topics Concern  . Not on file  Social History Narrative   Lives at home with father, stepmother, 45 year old brother.   Additional Social History: N/A    Allergies:   Allergies  Allergen Reactions  . Bee Venom  Anaphylaxis  . Fish Allergy Anaphylaxis  . Shellfish Allergy Anaphylaxis  . Tea Anaphylaxis    Labs:  Results for orders placed or performed during the hospital encounter of 08/31/18 (from the past 48 hour(s))  Comprehensive metabolic panel     Status: Abnormal   Collection Time: 08/31/18  1:34 AM  Result Value Ref Range   Sodium 133 (L) 135 - 145 mmol/L   Potassium 3.0 (L) 3.5 - 5.1 mmol/L    Comment: DELTA CHECK NOTED   Chloride 99 98 - 111 mmol/L   CO2 15 (L)  22 - 32 mmol/L   Glucose, Bld 364 (H) 70 - 99 mg/dL   BUN 9 6 - 20 mg/dL   Creatinine, Ser 2.35 0.61 - 1.24 mg/dL   Calcium 8.7 (L) 8.9 - 10.3 mg/dL   Total Protein 6.8 6.5 - 8.1 g/dL   Albumin 3.7 3.5 - 5.0 g/dL   AST 16 15 - 41 U/L   ALT 21 0 - 44 U/L   Alkaline Phosphatase 92 38 - 126 U/L   Total Bilirubin 2.2 (H) 0.3 - 1.2 mg/dL   GFR calc non Af Amer >60 >60 mL/min   GFR calc Af Amer >60 >60 mL/min   Anion gap 19 (H) 5 - 15    Comment: Performed at [facility], [address], [place], [place] [address]  Ethanol     Status: None   Collection Time: 08/31/18  1:34 AM  Result Value Ref Range   Alcohol, Ethyl (B) <10 <10 mg/dL    Comment: (NOTE) Lowest detectable limit for serum alcohol is 10 mg/dL. For medical purposes only. Performed at [facility], [address] [place], [place] [address]   cbc     Status: Abnormal   Collection Time: 08/31/18  1:34 AM  Result Value Ref Range   WBC 7.5 4.0 - 10.5 K/uL   RBC 4.50 4.22 - 5.81 MIL/uL   Hemoglobin 13.0 13.0 - 17.0 g/dL   HCT 08.6 (L) 76.1 - 95.0 %   MCV 84.2 80.0 - 100.0 fL   MCH 28.9 26.0 - 34.0 pg   MCHC 34.3 30.0 - 36.0 g/dL   RDW 93.2 67.1 - 24.5 %   Platelets 276 150 - 400 K/uL   nRBC 0.0 0.0 - 0.2 %    Comment: Performed at [facility], [address] [place], [place] [address]  POC CBG, ED     Status: Abnormal   Collection Time: 08/31/18  1:52 AM  Result Value Ref Range    Glucose-Capillary 344 (H) 70 - 99 mg/dL  Blood gas, venous     Status: Abnormal   Collection Time: 08/31/18  3:43 AM  Result Value Ref Range   FIO2 21.00    pH, Ven 7.363 7.250 - 7.430   pCO2, Ven 31.1 (L) 44.0 - 60.0 mmHg   pO2, Ven 74.8 (H) 32.0 - 45.0 mmHg   Bicarbonate 17.1 (L) 20.0 - 28.0 mmol/L   Acid-base deficit 6.3 (H) 0.0 - 2.0 mmol/L   O2 Saturation 94.4 %   Patient temperature 100.0    Collection site VEIN    Drawn by COLLECTED BY NURSE    Sample type VENOUS     Comment: Performed at [facility], [address], [place], [place] [address]  CBG monitoring, ED     Status: Abnormal   Collection Time: 08/31/18  4:31 AM  Result Value Ref Range   Glucose-Capillary 273 (H) 70 - 99 mg/dL  CBG monitoring, ED     Status: Abnormal   Collection Time: 08/31/18  5:40 AM  Result Value Ref Range   Glucose-Capillary 170 (H) 70 - 99 mg/dL  CBG monitoring, ED     Status: Abnormal   Collection Time: 08/31/18  6:37 AM  Result Value Ref Range   Glucose-Capillary 160 (H) 70 - 99 mg/dL  Rapid urine drug screen (hospital performed)     Status: None   Collection Time: 08/31/18  7:36 AM  Result Value Ref Range   Opiates NONE  DETECTED NONE DETECTED   Cocaine NONE DETECTED NONE DETECTED   Benzodiazepines NONE DETECTED NONE DETECTED   Amphetamines NONE DETECTED NONE DETECTED   Tetrahydrocannabinol NONE DETECTED NONE DETECTED   Barbiturates NONE DETECTED NONE DETECTED    Comment: (NOTE) DRUG SCREEN FOR MEDICAL PURPOSES ONLY.  IF CONFIRMATION IS NEEDED FOR ANY PURPOSE, NOTIFY LAB WITHIN 5 DAYS. LOWEST DETECTABLE LIMITS FOR URINE DRUG SCREEN Drug Class                     Cutoff (ng/mL) Amphetamine and metabolites    1000 Barbiturate and metabolites    200 Benzodiazepine                 200 Tricyclics and metabolites     300 Opiates and metabolites        300 Cocaine and metabolites        300 THC                            50 Performed at [facility], [address], [place], [place] [address]   Urinalysis, Routine w reflex microscopic     Status: Abnormal   Collection Time: 08/31/18  7:36 AM  Result Value Ref Range   Color, Urine STRAW (A) YELLOW   APPearance CLEAR CLEAR   Specific Gravity, Urine 1.017 1.005 - 1.030   pH 6.0 5.0 - 8.0   Glucose, UA >=500 (A) NEGATIVE mg/dL   Hgb urine dipstick NEGATIVE NEGATIVE   Bilirubin Urine NEGATIVE NEGATIVE   Ketones, ur 80 (A) NEGATIVE mg/dL   Protein, ur NEGATIVE NEGATIVE mg/dL   Nitrite NEGATIVE NEGATIVE   Leukocytes,Ua NEGATIVE NEGATIVE   RBC / HPF 0-5 0 - 5 RBC/hpf   WBC, UA 0-5 0 - 5 WBC/hpf   Bacteria, UA NONE SEEN NONE SEEN    Comment: Performed at [facility], [address] [place], [place] [address]  CBG monitoring, ED     Status: Abnormal   Collection Time: 08/31/18  7:36 AM  Result Value Ref Range   Glucose-Capillary 207 (H) 70 - 99 mg/dL   Comment 1 Notify RN   Basic metabolic panel     Status: Abnormal   Collection Time: 08/31/18  7:52 AM  Result Value Ref Range   Sodium 135 135 - 145 mmol/L   Potassium 3.0 (L) 3.5 - 5.1 mmol/L   Chloride 109 98 - 111 mmol/L   CO2 17 (L) 22 - 32 mmol/L   Glucose, Bld 334 (H) 70 - 99 mg/dL   BUN 5 (L) 6 - 20 mg/dL   Creatinine, Ser 0.98 (L) 0.61 - 1.24 mg/dL   Calcium 7.0 (L) 8.9 - 10.3 mg/dL   GFR calc non Af Amer >60 >60 mL/min   GFR calc Af Amer >60 >60 mL/min   Anion gap 9 5 - 15    Comment: Performed at [facility], [address] [place], [place] [address]  Magnesium     Status: None   Collection Time: 08/31/18  7:54 AM  Result Value Ref Range   Magnesium 1.7 1.7 - 2.4 mg/dL    Comment: Performed at [facility], [address] [place], [place] [address]  CBG monitoring, ED     Status: Abnormal   Collection Time: 08/31/18  8:44 AM  Result Value Ref Range   Glucose-Capillary 297 (H) 70 - 99 mg/dL  Comment 1 Notify RN   Glucose, capillary      Status: Abnormal   Collection Time: 08/31/18  9:37 AM  Result Value Ref Range   Glucose-Capillary 250 (H) 70 - 99 mg/dL   Comment 1 Notify RN   Glucose, capillary     Status: Abnormal   Collection Time: 08/31/18 10:39 AM  Result Value Ref Range   Glucose-Capillary 184 (H) 70 - 99 mg/dL   Comment 1 Notify RN   Glucose, capillary     Status: Abnormal   Collection Time: 08/31/18 11:38 AM  Result Value Ref Range   Glucose-Capillary 234 (H) 70 - 99 mg/dL   Comment 1 Notify RN   Glucose, capillary     Status: Abnormal   Collection Time: 08/31/18 12:38 PM  Result Value Ref Range   Glucose-Capillary 235 (H) 70 - 99 mg/dL   Comment 1 Notify RN     Current Facility-Administered Medications  Medication Dose Route Frequency Provider Last Rate Last Dose  . 0.9 %  sodium chloride infusion   Intravenous Continuous Lorretta Harp, MD 125 mL/hr at 08/31/18 [phone]    . dextrose 5 %-0.45 % sodium chloride infusion   Intravenous Continuous Lorretta Harp, MD 125 mL/hr at 08/31/18 0549    . enoxaparin (LOVENOX) injection 40 mg  40 mg Subcutaneous Q24H Lorretta Harp, MD      . insulin regular, human (MYXREDLIN) 100 units/ 100 mL infusion   Intravenous Continuous Lorretta Harp, MD 7.1 mL/hr at 08/31/18 0847 7.1 Units/hr at 08/31/18 0847  . potassium chloride 10 mEq in 100 mL IVPB  10 mEq Intravenous Q1 Hr x 3 Rolly Salter, MD        Musculoskeletal: Strength & Muscle Tone: within normal limits Gait & Station: UTA since patient is lying in bed. Patient leans: N/A  Psychiatric Specialty Exam: Physical Exam  Nursing note and vitals reviewed. Constitutional: He is oriented to person, place, and time. He appears well-developed and well-nourished.  HENT:  Head: Normocephalic and atraumatic.  Neck: Normal range of motion.  Respiratory: Effort normal.  Musculoskeletal: Normal range of motion.  Neurological: He is alert and oriented to person, place, and time.  Psychiatric: His speech is normal. Thought content  normal. He is withdrawn. Cognition and memory are normal. He expresses impulsivity. He exhibits a depressed mood.  Patient will not participate in interview and is selectively mute. He is heard speaking to staff.     Review of Systems  Unable to perform ROS: Other    Blood pressure 130/68, pulse 83, temperature 100 F (37.8 C), temperature source Oral, resp. rate 20, height  (1.778 m), weight 90.7 kg, SpO2 100 %.Body mass index is 28.7 kg/m.  General Appearance: Fairly Groomed, young, Hispanic male, wearing paper hospital scrubs with dyed pink and green hair who is lying in bed with the cover over his head. NAD.   Eye Contact:  Poor  Speech:  Clear and Coherent and Normal Rate  Volume:  Normal  Mood:  UTA since patient will not participate in interview.   Affect:  Depressed  Thought Process:  Linear and Descriptions of Associations: Intact  Orientation:  Full (Time, Place, and Person)  Thought Content:  UTA since patient will not participate in interview.   Suicidal Thoughts:  UTA since patient will not participate in interview.   Homicidal Thoughts:  UTA since patient will not participate in interview.   Memory:  UTA since patient will not participate in interview.  Judgement:  Poor  Insight:  Poor  Psychomotor Activity:  Decreased  Concentration:  Concentration: UTA since patient will not participate in interview.  and Attention Span: UTA since patient will not participate in interview.   Recall:  UTA since patient will not participate in interview.   Fund of Knowledge:  UTA since patient will not participate in interview.   Language:  Good  Akathisia:  NA  Handed:  Right  AIMS (if indicated):   N/A  Assets:  Housing Resilience Social Support  ADL's:  Intact  Cognition:  WNL  Sleep:   N/A    Assessment:  [name] [name]. is a 18 y.o. male who was admitted with DKA and SI. He was IVC'd by his cousin due to concern that patient was not caring for himself and  attempting suicide by overdosing on medication. He has been hospitalized multiple times for DKA due to poor medication compliance. He has several psychosocial stressors include the recent deportation of his parents. He warrants inpatient psychiatric hospitalization for stabilization and treatment once he is medically cleared.   Treatment Plan Summary: -Patient warrants inpatient psychiatric hospitalization given high risk of harm to self. -Continue bedside sitter.  -Restart home medications: Prozac 20 mg daily for mood and Trazodone 50 mg qhs PRN for sleep.  -EKG reviewed and QTc 430 on 3/3. Please closely monitor when starting or increasing QTc prolonging agents.   -Patient is IVC'd so he cannot leave the hospital.  -Will sign off on patient at this time. Please consult psychiatry again as needed.     Disposition: Recommend psychiatric Inpatient admission when medically cleared.  Cherly Beach, DO 08/31/2018 1:14 PM

## 2018-08-31 NOTE — ED Notes (Signed)
ED TO INPATIENT HANDOFF REPORT  ED Nurse Name and Phone #: Waldo Laine RN [phone]  S Name/Age/Gender [name] 18 y.o. male Room/Bed: WA25/WA25  Code Status   Code Status: Full Code  Home/SNF/Other Home Patient oriented to: self, place, time and situation Is this baseline? Yes   Triage Complete: Triage complete  Chief Complaint IVC  Triage Note Pt is IVC'd by his cousin, states he's not taking his  meds, has tried to jump in traffic, hang himself and attempted to take a large quantity of pills    Allergies Allergies  Allergen Reactions  . Bee Venom Anaphylaxis  . Fish Allergy Anaphylaxis  . Shellfish Allergy Anaphylaxis  . Tea Anaphylaxis    Level of Care/Admitting Diagnosis ED Disposition    ED Disposition Condition Comment   Admit  Hospital Area: [facility] [[id number]]  Level of Care: Stepdown [14]  Admit to SDU based on following criteria: Other see comments  Comments: DKA  Diagnosis: DKA (diabetic ketoacidoses) [facility]) [[id number]]  Admitting Physician: Lorretta Harp [[id number]]  Attending Physician: Lorretta Harp [phone]  Estimated length of stay: past midnight tomorrow  Certification:: I certify this patient will need inpatient services for at least 2 midnights  PT Class (Do Not Modify): Inpatient [101]  PT Acc Code (Do Not Modify): Private [1]       B Medical/Surgery History Past Medical History:  Diagnosis Date  . ADD (attention deficit disorder)   . Allergy   . Anxiety   . Asthma   . Depression   . Epileptic seizure (HCC)    "both petit and grand mal; last sz ~ 2 wk ago" (06/17/2013)  . Headache(784.0)    "2-3 times/wk usually" (06/17/2013)  . Heart murmur    "heard a slight one earlier today" (06/17/2013)  . Homeless   . Migraine    "maybe once/month; it's severe" (06/17/2013)  . ODD (oppositional defiant disorder)   . Sickle cell trait (HCC)   . Type I diabetes mellitus (HCC)    "that's what they're thinking now" (06/17/2013)   . Vision abnormalities    "takes him longer to focus cause; from his sz" (06/17/2013)   Past Surgical History:  Procedure Laterality Date  . CIRCUMCISION  2000  . FINGER SURGERY Left 2001   "crushed pinky; had to repair it" (06/17/2013)     A IV Location/Drains/Wounds Patient Lines/Drains/Airways Status   Active Line/Drains/Airways    Name:   Placement date:   Placement time:   Site:   Days:   Peripheral IV 08/31/18 Right Antecubital   08/31/18    0318    Antecubital   less than 1   Peripheral IV 08/31/18 Left Forearm   08/31/18    0315    Forearm   less than 1   Wound / Incision (Open or Dehisced) 04/04/18 Non-pressure wound Foot Right   04/04/18    1900    Foot   149          Intake/Output Last 24 hours  Intake/Output Summary (Last 24 hours) at 08/31/2018 0843 Last data filed at 08/31/2018 0741 Gross per 24 hour  Intake 3865.08 ml  Output --  Net 3865.08 ml    Labs/Imaging Results for orders placed or performed during the hospital encounter of 08/31/18 (from the past 48 hour(s))  Comprehensive metabolic panel     Status: Abnormal   Collection Time: 08/31/18  1:34 AM  Result Value Ref Range   Sodium 133 (L) 135 -  145 mmol/L   Potassium 3.0 (L) 3.5 - 5.1 mmol/L    Comment: DELTA CHECK NOTED   Chloride 99 98 - 111 mmol/L   CO2 15 (L) 22 - 32 mmol/L   Glucose, Bld 364 (H) 70 - 99 mg/dL   BUN 9 6 - 20 mg/dL   Creatinine, Ser 1.47 0.61 - 1.24 mg/dL   Calcium 8.7 (L) 8.9 - 10.3 mg/dL   Total Protein 6.8 6.5 - 8.1 g/dL   Albumin 3.7 3.5 - 5.0 g/dL   AST 16 15 - 41 U/L   ALT 21 0 - 44 U/L   Alkaline Phosphatase 92 38 - 126 U/L   Total Bilirubin 2.2 (H) 0.3 - 1.2 mg/dL   GFR calc non Af Amer >60 >60 mL/min   GFR calc Af Amer >60 >60 mL/min   Anion gap 19 (H) 5 - 15    Comment: Performed at [facility], [address] [place], [place] [address]  Ethanol     Status: None   Collection Time: 08/31/18  1:34 AM  Result Value Ref Range   Alcohol, Ethyl  (B) <10 <10 mg/dL    Comment: (NOTE) Lowest detectable limit for serum alcohol is 10 mg/dL. For medical purposes only. Performed at [facility], [address], [place], [place] [address]   cbc     Status: Abnormal   Collection Time: 08/31/18  1:34 AM  Result Value Ref Range   WBC 7.5 4.0 - 10.5 K/uL   RBC 4.50 4.22 - 5.81 MIL/uL   Hemoglobin 13.0 13.0 - 17.0 g/dL   HCT 65.7 (L) 84.6 - 96.2 %   MCV 84.2 80.0 - 100.0 fL   MCH 28.9 26.0 - 34.0 pg   MCHC 34.3 30.0 - 36.0 g/dL   RDW 95.2 84.1 - 32.4 %   Platelets 276 150 - 400 K/uL   nRBC 0.0 0.0 - 0.2 %    Comment: Performed at [facility], [address] [place], [place] [address]  POC CBG, ED     Status: Abnormal   Collection Time: 08/31/18  1:52 AM  Result Value Ref Range   Glucose-Capillary 344 (H) 70 - 99 mg/dL  Blood gas, venous     Status: Abnormal   Collection Time: 08/31/18  3:43 AM  Result Value Ref Range   FIO2 21.00    pH, Ven 7.363 7.250 - 7.430   pCO2, Ven 31.1 (L) 44.0 - 60.0 mmHg   pO2, Ven 74.8 (H) 32.0 - 45.0 mmHg   Bicarbonate 17.1 (L) 20.0 - 28.0 mmol/L   Acid-base deficit 6.3 (H) 0.0 - 2.0 mmol/L   O2 Saturation 94.4 %   Patient temperature 100.0    Collection site VEIN    Drawn by COLLECTED BY NURSE    Sample type VENOUS     Comment: Performed at [facility], [address] [place], [place] [address]  CBG monitoring, ED     Status: Abnormal   Collection Time: 08/31/18  4:31 AM  Result Value Ref Range   Glucose-Capillary 273 (H) 70 - 99 mg/dL  CBG monitoring, ED     Status: Abnormal   Collection Time: 08/31/18  5:40 AM  Result Value Ref Range   Glucose-Capillary 170 (H) 70 - 99 mg/dL  CBG monitoring, ED     Status: Abnormal   Collection Time: 08/31/18  6:37 AM  Result Value Ref Range   Glucose-Capillary 160 (H) 70 - 99  mg/dL  Rapid urine drug screen (hospital performed)     Status: None   Collection Time: 08/31/18  7:36 AM  Result  Value Ref Range   Opiates NONE DETECTED NONE DETECTED   Cocaine NONE DETECTED NONE DETECTED   Benzodiazepines NONE DETECTED NONE DETECTED   Amphetamines NONE DETECTED NONE DETECTED   Tetrahydrocannabinol NONE DETECTED NONE DETECTED   Barbiturates NONE DETECTED NONE DETECTED    Comment: (NOTE) DRUG SCREEN FOR MEDICAL PURPOSES ONLY.  IF CONFIRMATION IS NEEDED FOR ANY PURPOSE, NOTIFY LAB WITHIN 5 DAYS. LOWEST DETECTABLE LIMITS FOR URINE DRUG SCREEN Drug Class                     Cutoff (ng/mL) Amphetamine and metabolites    1000 Barbiturate and metabolites    200 Benzodiazepine                 200 Tricyclics and metabolites     300 Opiates and metabolites        300 Cocaine and metabolites        300 THC                            50 Performed at [facility], [address], [place], [place] [address]   Urinalysis, Routine w reflex microscopic     Status: Abnormal   Collection Time: 08/31/18  7:36 AM  Result Value Ref Range   Color, Urine STRAW (A) YELLOW   APPearance CLEAR CLEAR   Specific Gravity, Urine 1.017 1.005 - 1.030   pH 6.0 5.0 - 8.0   Glucose, UA >=500 (A) NEGATIVE mg/dL   Hgb urine dipstick NEGATIVE NEGATIVE   Bilirubin Urine NEGATIVE NEGATIVE   Ketones, ur 80 (A) NEGATIVE mg/dL   Protein, ur NEGATIVE NEGATIVE mg/dL   Nitrite NEGATIVE NEGATIVE   Leukocytes,Ua NEGATIVE NEGATIVE   RBC / HPF 0-5 0 - 5 RBC/hpf   WBC, UA 0-5 0 - 5 WBC/hpf   Bacteria, UA NONE SEEN NONE SEEN    Comment: Performed at [facility], [address] [place], [place] [address]  Basic metabolic panel     Status: Abnormal   Collection Time: 08/31/18  7:52 AM  Result Value Ref Range   Sodium 135 135 - 145 mmol/L   Potassium 3.0 (L) 3.5 - 5.1 mmol/L   Chloride 109 98 - 111 mmol/L   CO2 17 (L) 22 - 32 mmol/L   Glucose, Bld 334 (H) 70 - 99 mg/dL   BUN 5 (L) 6 - 20 mg/dL   Creatinine, Ser 1.91 (L) 0.61 - 1.24 mg/dL   Calcium 7.0 (L) 8.9 - 10.3 mg/dL    GFR calc non Af Amer >60 >60 mL/min   GFR calc Af Amer >60 >60 mL/min   Anion gap 9 5 - 15    Comment: Performed at [facility], [address] [place], [place] [address]  Magnesium     Status: None   Collection Time: 08/31/18  7:54 AM  Result Value Ref Range   Magnesium 1.7 1.7 - 2.4 mg/dL    Comment: Performed at [facility], [address], [place], [place] [address]   Dg Chest 1 View  Result Date: 08/29/2018 CLINICAL DATA:  DKA EXAM: CHEST  1 VIEW COMPARISON:  08/18/2018 FINDINGS: The heart size and mediastinal contours are within normal limits. Both lungs are clear. The visualized skeletal structures  are unremarkable. IMPRESSION: No active disease. Electronically Signed   By: Elige Ko   On: 08/29/2018 13:16   Ct Abdomen Pelvis W Contrast  Result Date: 08/29/2018 CLINICAL DATA:  Right-sided abdominal pain and fever for 2 days. Suspected appendicitis. EXAM: CT ABDOMEN AND PELVIS WITH CONTRAST TECHNIQUE: Multidetector CT imaging of the abdomen and pelvis was performed using the standard protocol following bolus administration of intravenous contrast. CONTRAST:  ISOVUE-300 IOPAMIDOL (ISOVUE-300) INJECTION 61% COMPARISON:  07/16/2017 FINDINGS: Lower Chest: No acute findings. Hepatobiliary: No hepatic masses identified. Gallbladder is unremarkable. Pancreas:  No mass or inflammatory changes. Spleen: Within normal limits in size and appearance. Adrenals/Urinary Tract: No masses identified. No evidence of hydronephrosis. Mild diffuse bladder wall thickening is noted, suspicious for cystitis. Stomach/Bowel: No evidence of obstruction, inflammatory process or abnormal fluid collections. Normal appendix visualized. Vascular/Lymphatic: No pathologically enlarged lymph nodes. No abdominal aortic aneurysm. Reproductive:  No mass or other significant abnormality. Other:  None. Musculoskeletal:  No suspicious bone lesions identified. IMPRESSION: 1. Mild diffuse  bladder wall thickening, suspicious for cystitis. Suggest correlation with urinalysis. 2. No evidence of appendicitis or other acute findings. Electronically Signed   By: Myles Rosenthal M.D.   On: 08/29/2018 14:54    Pending Labs Unresulted Labs (From admission, onward)    Start     Ordered   08/31/18 0430  Basic metabolic panel  STAT Now then every 4 hours ,   STAT     08/31/18 0432          Vitals/Pain Today's Vitals   08/31/18 0340 08/31/18 0540 08/31/18 0600 08/31/18 0656  BP: 115/69 113/67 132/81 132/81  Pulse: 83 93 75 76  Resp: (!) Temp:      TempSrc:      SpO2: 100% 100% 100% 99%  Weight:      Height:      PainSc:        Isolation Precautions No active isolations  Medications Medications  0.9 %  sodium chloride infusion ( Intravenous New Bag/Given 08/31/18 0538)  dextrose 5 %-0.45 % sodium chloride infusion ( Intravenous New Bag/Given 08/31/18 0549)  insulin regular, human (MYXREDLIN) 100 units/ 100 mL infusion (2.9 Units/hr Intravenous Rate/Dose Change 08/31/18 0746)  enoxaparin (LOVENOX) injection 40 mg (has no administration in time range)  potassium chloride 10 mEq in 100 mL IVPB (has no administration in time range)  potassium chloride 10 mEq in 100 mL IVPB (0 mEq Intravenous Stopped 08/31/18 0740)  sodium chloride 0.9 % bolus 1,000 mL (0 mLs Intravenous Stopped 08/31/18 0440)  sodium chloride 0.9 % bolus 2,000 mL (0 mLs Intravenous Stopped 08/31/18 0553)    Mobility walks Low fall risk   Focused Assessments DKA   R Recommendations: See Admitting Provider Note  Report given to:   Additional Notes: IVC'd

## 2018-08-31 NOTE — ED Provider Notes (Signed)
[facility]-EMERGENCY DEPT Provider Note   CSN: [id number] Arrival date & time: 08/31/18  0136    History   Chief Complaint Chief Complaint  Patient presents with  . IVC    HPI [name] EmeryHector M Bacigalupo Jr. is a 18 y.o. male.     [name] Aline AugustM Hohler Jr. is a 18 y.o. male with a history of type 1 diabetes, seizures, asthma, depression, anxiety and ODD, who presents to the emergency department under IVC for suicidal ideations.  Patient reports that he thought he was going to visit his brother and present today but his cousin took out IVC paperwork on him and police brought him in for evaluation.  Per his cousin he has made multiple threats to harm himself including jumping in traffic, hanging himself or attempting to take a large quantity of pills and the patient has not been taking his medications and taking care of himself.  Patient was seen in the emergency department on 3/1 and admitted to the ICU for DKA, but checked out AGAINST MEDICAL ADVICE because he wanted to have something to eat and they would not give him anything because of his sugar.  He reports he knows that his diabetes and DKA could kill him but he does not care.  Patient is unwilling to participate further in history, will not answer any further questions.     Past Medical History:  Diagnosis Date  . ADD (attention deficit disorder)   . Allergy   . Anxiety   . Asthma   . Depression   . Epileptic seizure (HCC)    "both petit and grand mal; last sz ~ 2 wk ago" (06/17/2013)  . Headache(784.0)    "2-3 times/wk usually" (06/17/2013)  . Heart murmur    "heard a slight one earlier today" (06/17/2013)  . Homeless   . Migraine    "maybe once/month; it's severe" (06/17/2013)  . ODD (oppositional defiant disorder)   . Sickle cell trait (HCC)   . Type I diabetes mellitus (HCC)    "that's what they're thinking now" (06/17/2013)  . Vision abnormalities    "takes him longer to focus cause; from his sz"  (06/17/2013)    Patient Active Problem List   Diagnosis Date Noted  . Atypical chest pain 08/19/2018  . Borderline personality disorder (HCC) 02/26/2018  . 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 DKA, type 1 (HCC) 07/16/2017  . Nausea & vomiting 07/16/2017  . Uncontrolled type 1 diabetes circulatory disorder erectile dysfunction (HCC)   . DKA (diabetic ketoacidoses) (HCC) 12/18/2016  . History of seizures 12/01/2016  . History of migraine 12/01/2016  . Disordered eating 03/08/2015  . Ketonuria   . Adjustment reaction to medical therapy   . Non compliance w medication regimen   . 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 Dehydration 08/22/2014  . Hyperglycemia due to type 1 diabetes mellitus (HCC) 08/21/2014  . Type 1 diabetes mellitus with hyperglycemia (HCC)   . Noncompliance   . Generalized abdominal pain   . Glycosuria   . Depression 07/12/2014  . Involuntary movements 06/13/2014  . Bilateral leg pain 06/13/2014  . 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 Suicidal ideation 03/31/2014  . ODD (oppositional defiant disorder) 03/03/2014  . MDD (major depressive disorder), recurrent episode, moderate (HCC) 02/16/2014  . Attention deficit hyperactivity disorder (  ADHD), combined type, moderate 02/16/2014  . Microcytic anemia 02/15/2014  . Hyperglycemia 02/14/2014  . Goiter 02/04/2014  . 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 Acquired acanthosis nigricans 02/04/2014  . Obesity, morbid (HCC) 02/04/2014  . Insulin resistance 02/04/2014  . Hyperinsulinemia 02/04/2014  . Hypoglycemia associated with diabetes (HCC) 02/02/2014  . Maladaptive health behaviors affecting medical condition 02/02/2014  .  Hypoglycemia 02/02/2014  . Type 1 diabetes mellitus in patient 24 to 20 years of age with hemoglobin A1c goal of less than 7.5% (HCC) 01/26/2014  . 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  . 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 Migraine without aura 12/13/2013  . Body mass index, pediatric, greater than or equal to 95th percentile for age 76/16/2015  . Asthma 12/13/2013  . Hypoglycemia unawareness in type 1 diabetes mellitus (HCC) 08/08/2013  . Seizure disorder (HCC) 07/06/2013  . Short-term memory loss 07/06/2013  . Sickle cell trait (HCC) 07/06/2013  . Diabetes (HCC) 06/17/2013  . Diabetes mellitus, new onset (HCC) 06/17/2013    Past Surgical History:  Procedure Laterality Date  . CIRCUMCISION  2000  . FINGER SURGERY Left 2001   "crushed pinky; had to repair it" (06/17/2013)        Home Medications    Prior to Admission medications   Medication Sig Start Date End Date Taking? Authorizing Provider  albuterol (PROVENTIL HFA;VENTOLIN HFA) 108 (90 Base) MCG/ACT inhaler Inhale 1 puff into the lungs every 4 (four) hours as needed for wheezing or shortness of breath.   Yes [provider]  hydrOXYzine (ATARAX/VISTARIL) 50 MG tablet Take 100 mg by mouth daily as needed for anxiety.    Yes [provider]  insulin aspart (NOVOLOG FLEXPEN) 100 UNIT/ML FlexPen Inject 0-15 Units into the skin 3 (three) times daily with meals. (Sliding scale): For diabetes management 03/09/18  Yes Armandina Stammer I, NP  insulin glargine (LANTUS) 100 UNIT/ML injection Inject 0.3 mLs (30 Units total) into the skin 2 (two) times daily. 07/12/18  Yes Ward, Kristen N, DO  SUBOXONE 8-2 MG FILM Place 1 Film under the tongue 2 (two) times daily. 08/11/18  Yes [provider]  traZODone (DESYREL) 50 MG tablet Take 100 mg by mouth at bedtime as needed for sleep.    Yes [provider]  glucose blood (ACCU-CHEK GUIDE) test strip Check glucose 6x  daily: For blood sugar checks 03/22/18   Long, Arlyss Repress, MD    Family History Family History  Problem Relation Age of Onset  . Asthma Mother   . COPD Mother   . Other Mother        possible autoimmune, unclear  . Diabetes Maternal Grandmother   . Heart disease Maternal Grandmother   . Hypertension Maternal Grandmother   . Mental illness Maternal Grandmother   . Heart disease Maternal Grandfather   . Hyperlipidemia Paternal Grandmother   . Hyperlipidemia Paternal Grandfather   . Migraines Sister        Hemiplegic Migraines     Social History Social History   Tobacco Use  . Smoking status: Passive Smoke Exposure - Never Smoker  . Smokeless tobacco: Never Used  . Tobacco comment: Mom and dad smoke outside   Substance Use Topics  . Alcohol use: No    Alcohol/week: 0.0 standard drinks  . Drug use: No     Allergies   Bee venom; Fish allergy; Shellfish allergy; and Tea   Review of Systems Review of Systems  Unable to perform ROS: Other (Patient refuses to participate or answer further questions)     Physical Exam Updated Vital Signs BP (!) 143/89 (BP Location: Left Arm)   Pulse (!) 110   Temp 100 F (37.8 C) (Oral)   Resp 18   Ht  (1.778 m)   Wt 90.7 kg   SpO2 100%   BMI 28.70 kg/m   Physical Exam Vitals signs and nursing note reviewed.  Constitutional:      General: He is not in acute distress.    Appearance: Normal appearance. He is well-developed and normal weight. He is not ill-appearing or diaphoretic.  HENT:     Head: Normocephalic and atraumatic.     Mouth/Throat:     Mouth: Mucous membranes are moist.     Pharynx: Oropharynx is clear.  Eyes:     General:        Right eye: No discharge.        Left eye: No discharge.     Pupils: Pupils are equal, round, and reactive to light.  Neck:     Musculoskeletal: Neck supple.  Cardiovascular:     Rate and Rhythm: Normal rate and regular rhythm.     Pulses: Normal pulses.     Heart sounds:  Normal heart sounds.  Pulmonary:     Effort: Pulmonary effort is normal. No respiratory distress.     Breath sounds: Normal breath sounds. No wheezing or rales.     Comments: Respirations equal and unlabored, patient able to speak in full sentences, lungs clear to auscultation bilaterally Abdominal:     General: Bowel sounds are normal. There is no distension.     Palpations: Abdomen is soft. There is no mass.     Tenderness: There is no abdominal tenderness. There is no guarding.     Comments: Abdomen soft, nondistended, nontender to palpation in all quadrants without guarding or peritoneal signs  Musculoskeletal:        General: No deformity.  Skin:    General: Skin is warm and dry.     Capillary Refill: Capillary refill takes less than 2 seconds.  Neurological:     Mental Status: He is alert and oriented to person, place, and time.     Coordination: Coordination normal.     Comments: Speech is clear, able to follow commands Moves extremities without ataxia, coordination intact   Psychiatric:        Mood and Affect: Affect is blunt.        Speech: He is noncommunicative.        Behavior: Behavior is uncooperative. Behavior is not withdrawn.      ED Treatments / Results  Labs (all labs ordered are listed, but only abnormal results are displayed) Labs Reviewed  COMPREHENSIVE METABOLIC PANEL - Abnormal; Notable for the following components:      Result Value   Sodium 133 (*)    Potassium 3.0 (*)    CO2 15 (*)    Glucose, Bld 364 (*)    Calcium 8.7 (*)    Total Bilirubin 2.2 (*)    Anion gap 19 (*)    All other components within normal limits  CBC - Abnormal; Notable for the following components:   HCT 37.9 (*)    All other components within normal limits  BLOOD GAS, VENOUS - Abnormal; Notable for the following components:   pCO2, Ven 31.1 (*)    pO2, Ven 74.8 (*)    Bicarbonate 17.1 (*)  Acid-base deficit 6.3 (*)    All other components within normal limits  CBG  MONITORING, ED - Abnormal; Notable for the following components:   Glucose-Capillary 344 (*)    All other components within normal limits  CBG MONITORING, ED - Abnormal; Notable for the following components:   Glucose-Capillary 273 (*)    All other components within normal limits  ETHANOL  RAPID URINE DRUG SCREEN, HOSP PERFORMED  URINALYSIS, ROUTINE W REFLEX MICROSCOPIC  BASIC METABOLIC PANEL  BASIC METABOLIC PANEL  BASIC METABOLIC PANEL  BASIC METABOLIC PANEL  BASIC METABOLIC PANEL  MAGNESIUM    EKG EKG Interpretation  Date/Time:  Tuesday August 31 2018 03:37:40 EST Ventricular Rate:  81 PR Interval:    QRS Duration: 90 QT Interval:  370 QTC Calculation: 430 R Axis:   75 Text Interpretation:  Sinus rhythm Borderline T wave abnormalities Borderline ST elevation, anterior leads When compared with ECG of 08/29/2018, QT has shortened Nonspecific T wave abnormality is no longer present Confirmed by Dione Booze ([id number]) on 08/31/2018 3:50:52 AM   Radiology Dg Chest 1 View  Result Date: 08/29/2018 CLINICAL DATA:  DKA EXAM: CHEST  1 VIEW COMPARISON:  08/18/2018 FINDINGS: The heart size and mediastinal contours are within normal limits. Both lungs are clear. The visualized skeletal structures are unremarkable. IMPRESSION: No active disease. Electronically Signed   By: Elige Ko   On: 08/29/2018 13:16   Ct Abdomen Pelvis W Contrast  Result Date: 08/29/2018 CLINICAL DATA:  Right-sided abdominal pain and fever for 2 days. Suspected appendicitis. EXAM: CT ABDOMEN AND PELVIS WITH CONTRAST TECHNIQUE: Multidetector CT imaging of the abdomen and pelvis was performed using the standard protocol following bolus administration of intravenous contrast. CONTRAST:  ISOVUE-300 IOPAMIDOL (ISOVUE-300) INJECTION 61% COMPARISON:  07/16/2017 FINDINGS: Lower Chest: No acute findings. Hepatobiliary: No hepatic masses identified. Gallbladder is unremarkable. Pancreas:  No mass or inflammatory changes.  Spleen: Within normal limits in size and appearance. Adrenals/Urinary Tract: No masses identified. No evidence of hydronephrosis. Mild diffuse bladder wall thickening is noted, suspicious for cystitis. Stomach/Bowel: No evidence of obstruction, inflammatory process or abnormal fluid collections. Normal appendix visualized. Vascular/Lymphatic: No pathologically enlarged lymph nodes. No abdominal aortic aneurysm. Reproductive:  No mass or other significant abnormality. Other:  None. Musculoskeletal:  No suspicious bone lesions identified. IMPRESSION: 1. Mild diffuse bladder wall thickening, suspicious for cystitis. Suggest correlation with urinalysis. 2. No evidence of appendicitis or other acute findings. Electronically Signed   By: Myles Rosenthal M.D.   On: 08/29/2018 14:54    Procedures .Critical Care Performed by: Dartha Lodge, PA-C Authorized by: Dartha Lodge, PA-C   Critical care provider statement:    Critical care time (minutes):  45   Critical care was necessary to treat or prevent imminent or life-threatening deterioration of the following conditions:  Endocrine crisis (DKA)   Critical care was time spent personally by me on the following activities:  Discussions with consultants, evaluation of patient's response to treatment, examination of patient, ordering and performing treatments and interventions, ordering and review of laboratory studies, ordering and review of radiographic studies, pulse oximetry, re-evaluation of patient's condition, obtaining history from patient or surrogate and review of old charts   (including critical care time)  Medications Ordered in ED Medications  insulin regular, human (MYXREDLIN) 100 units/ 100 mL infusion (3 Units/hr Intravenous New Bag/Given 08/31/18 0332)  dextrose 5 %-0.45 % sodium chloride infusion ( Intravenous Hold 08/31/18 0326)  potassium chloride 10 mEq in 100 mL IVPB (  10 mEq Intravenous New Bag/Given 08/31/18 0330)  sodium chloride 0.9 % bolus  2,000 mL (has no administration in time range)  0.9 %  sodium chloride infusion (has no administration in time range)  dextrose 5 %-0.45 % sodium chloride infusion (has no administration in time range)  insulin regular, human (MYXREDLIN) 100 units/ 100 mL infusion (has no administration in time range)  enoxaparin (LOVENOX) injection 40 mg (has no administration in time range)  potassium chloride SA (K-DUR,KLOR-CON) CR tablet 40 mEq (has no administration in time range)  sodium chloride 0.9 % bolus 1,000 mL (1,000 mLs Intravenous New Bag/Given 08/31/18 0330)     Initial Impression / Assessment and Plan / ED Course  I have reviewed the triage vital signs and the nursing notes.  Pertinent labs & imaging results that were available during my care of the patient were reviewed by me and considered in my medical decision making (see chart for details).  Patient presents under IVC for evaluation of suicidal ideations.  Patient was IVC by his cousins who report that he has not been taking care of himself and has made multiple threats of suicide with multiple different plans.  Patient was admitted on 3/1 for DKA but left the ICU AMA because he wanted to eat.  Patient reports he knows that her sugar is high and that DKA could be life-threatening but he does not care he has not been taking his diabetes medications.  History limited as patient is unwilling to participate.  He denies any focal pain.  Vital stable and no acute findings on exam.  First exam for IVC completed.  Labs show patient remains in DKA with glucose of 364 and an anion gap of 19 with a potassium of 3 and sodium of 133, no leukocytosis, VBG shows pH of 7.36, bicarb of 17.  Negative ethanol level.  Awaiting urinalysis and UDS.  Patient will require admission for continued treatment of his DKA, patient started on glucose stabilizer protocol and will also be given runs of potassium.  Patient will need inpatient psychiatry consult but first exam  completed regarding IVC.  4:34 AM Case discussed with Dr. Clyde Lundborg who will see and admit the patient, Dr. Clyde Lundborg requests orders for suicide precautions with sitter at bedside.  Final Clinical Impressions(s) / ED Diagnoses   Final diagnoses:  Diabetic ketoacidosis without coma associated with type 1 diabetes mellitus (HCC)  Suicidal ideations  Involuntary commitment    ED Discharge Orders    None       Dartha Lodge, [place] 08/31/18 0533    Dione Booze, MD 08/31/18 [phone]

## 2018-08-31 NOTE — Discharge Summary (Signed)
[facility] Discharge Summary   Patient: [name] [name]. GMW:[id number]   PCP: Patient, No Pcp Per DOB: 09-19-2000   Date of admission: 08/29/2018   Date of discharge: 08/31/2018    Left AMA before finishing the treatment   Discharge Diagnoses:  Principal diagnosis DKA  Principal Problem:   DKA (diabetic ketoacidoses) (HCC) Active Problems:   Sickle cell trait (HCC)   Type 1 diabetes mellitus in patient 60 to 20 years of age with hemoglobin A1c goal of less than 7.5% (HCC)   Microcytic anemia   Attention deficit hyperactivity disorder (ADHD), combined type, moderate   Major depressive disorder, recurrent severe without psychotic features [facility])   Discharge Condition: poor  History of present illness: As per the H and P dictated on admission, "[name] [name]. is a 18 y.o. male with Past medical history of type I DM, seizures, anxiety, ADHD, depression. The patient presents with an episode of unresponsiveness.  Patient at the time of my evaluation reports that he has some dizziness, some abdominal pain as well as nausea. No fever no chills no diarrhea. He was found by his roommate unresponsive on arrival patient was given IV hydration with improvement in mentation. Patient also reported some right arm pain where he has a new tattoo. Patient tells me that he does not have money to buy insulin and he also does not have any means of transportation to go to the pharmacy to get it."  Hospital Course:  Pt presented with abdominal pain and nausea as well as vomiting  Started on insulin glucose stablizer regimen and BMP were being monitored  Pt has refused blood work in AM and left the hospital against medical advice  Last anion gap was 19.   patient left AMA  Procedures and Results:  none   Consultations:  none  The results of significant diagnostics from this hospitalization (including imaging, microbiology, ancillary and laboratory) are listed below for reference.     Significant Diagnostic Studies: Dg Chest 1 View  Result Date: 08/29/2018 CLINICAL DATA:  DKA EXAM: CHEST  1 VIEW COMPARISON:  08/18/2018 FINDINGS: The heart size and mediastinal contours are within normal limits. Both lungs are clear. The visualized skeletal structures are unremarkable. IMPRESSION: No active disease. Electronically Signed   By: Elige Ko   On: 08/29/2018 13:16   Dg Chest 2 View  Result Date: 08/18/2018 CLINICAL DATA:  Mid chest pain for over 1 week. EXAM: CHEST - 2 VIEW COMPARISON:  08/09/2018 FINDINGS: The heart size and mediastinal contours are within normal limits. Both lungs are clear. The visualized skeletal structures are unremarkable. IMPRESSION: No active cardiopulmonary disease. Electronically Signed   By: Tollie Eth M.D.   On: 08/18/2018 01:35   Ct Abdomen Pelvis W Contrast  Result Date: 08/29/2018 CLINICAL DATA:  Right-sided abdominal pain and fever for 2 days. Suspected appendicitis. EXAM: CT ABDOMEN AND PELVIS WITH CONTRAST TECHNIQUE: Multidetector CT imaging of the abdomen and pelvis was performed using the standard protocol following bolus administration of intravenous contrast. CONTRAST:  ISOVUE-300 IOPAMIDOL (ISOVUE-300) INJECTION 61% COMPARISON:  07/16/2017 FINDINGS: Lower Chest: No acute findings. Hepatobiliary: No hepatic masses identified. Gallbladder is unremarkable. Pancreas:  No mass or inflammatory changes. Spleen: Within normal limits in size and appearance. Adrenals/Urinary Tract: No masses identified. No evidence of hydronephrosis. Mild diffuse bladder wall thickening is noted, suspicious for cystitis. Stomach/Bowel: No evidence of obstruction, inflammatory process or abnormal fluid collections. Normal appendix visualized. Vascular/Lymphatic: No pathologically enlarged lymph nodes. No  abdominal aortic aneurysm. Reproductive:  No mass or other significant abnormality. Other:  None. Musculoskeletal:  No suspicious bone lesions identified. IMPRESSION:  1. Mild diffuse bladder wall thickening, suspicious for cystitis. Suggest correlation with urinalysis. 2. No evidence of appendicitis or other acute findings. Electronically Signed   By: Myles Rosenthal M.D.   On: 08/29/2018 14:54   Dg Chest Port 1 View  Result Date: 08/09/2018 CLINICAL DATA:  18 y/o  M; chest pain. EXAM: PORTABLE CHEST 1 VIEW COMPARISON:  03/22/2018 chest radiograph. FINDINGS: Stable heart size and mediastinal contours are within normal limits. Both lungs are clear. The visualized skeletal structures are unremarkable. IMPRESSION: No active disease. Electronically Signed   By: Mitzi Hansen M.D.   On: 08/09/2018 06:14    Microbiology: No results found for this or any previous visit (from the past 240 hour(s)).   Labs: CBC: Lab 08/26/18 1229 08/26/18 1255 08/29/18 1224 08/29/18 1321  WBC 6.3  --  8.0  --   NEUTROABS 4.3  --  5.9  --   HGB 14.7 14.6 13.0 11.9*  HCT 44.8 43.0 39.6 35.0*  MCV 85.7  --  88.8  --   PLT 297  --  268  --    Basic Metabolic Panel: Lab 08/26/18 1229  08/29/18 1224 08/29/18 1321 08/29/18 1330 08/29/18 1734 08/30/18 0104  NA 129*   < > 136 136  --  138 135  K 4.1   < > 4.5 4.7  --  4.9 3.9  CL 95*  --  105  --   --  114* 115*  CO2 11*  --  6*  --   --  7* 14*  GLUCOSE 501*  --  439*  --   --  191* 170*  BUN 11  --  10  --   --  7 <5*  CREATININE 1.44*  --  1.14  --  0.60* 1.00 0.75  CALCIUM 8.3*  --  7.9*  --   --  7.5* 7.4*   Liver Function Tests: Recent Labs  Lab 08/26/18 1229 08/29/18 1224 08/31/18 0134  AST 15 14* 16  ALT 47* 28 21  ALKPHOS 93 101 92  BILITOT 1.5* 1.9* 2.2*  PROT 6.4* 6.5 6.8  ALBUMIN 3.6 3.8 3.7   No results for input(s): LIPASE, AMYLASE in the last 168 hours. No results for input(s): AMMONIA in the last 168 hours. Cardiac Enzymes: No results for input(s): CKTOTAL, CKMB, CKMBINDEX, TROPONINI in the last 168 hours. BNP (last 3 results) No results for input(s): BNP in the last 8760  hours. CBG:  Time spent: 20 minutes  Signed:  Lynden Oxford  [facility] 08/31/2018,

## 2018-08-31 NOTE — Progress Notes (Signed)
VAST RN consulted for STAT placement of PIV for DKA and insulin infusion. Arrived to pt's room and pt in the shower. Spoke with unit RN who stated pt pulled out IV in Right AC intentionally and IV in left arm occluded. She attempted several sticks unsuccessfully.

## 2018-09-01 DIAGNOSIS — E1010 Type 1 diabetes mellitus with ketoacidosis without coma: Principal | ICD-10-CM

## 2018-09-01 LAB — CBC WITH DIFFERENTIAL/PLATELET
Abs Immature Granulocytes: 0.11 10*3/uL — ABNORMAL HIGH (ref 0.00–0.07)
BASOS ABS: 0 10*3/uL (ref 0.0–0.1)
Basophils Relative: 0 %
Eosinophils Absolute: 0.1 10*3/uL (ref 0.0–0.5)
Eosinophils Relative: 1 %
HCT: 36.1 % — ABNORMAL LOW (ref 39.0–52.0)
Hemoglobin: 12.4 g/dL — ABNORMAL LOW (ref 13.0–17.0)
Immature Granulocytes: 2 %
Lymphocytes Relative: 20 %
Lymphs Abs: 1.4 10*3/uL (ref 0.7–4.0)
MCH: 29.2 pg (ref 26.0–34.0)
MCHC: 34.3 g/dL (ref 30.0–36.0)
MCV: 85.1 fL (ref 80.0–100.0)
Monocytes Absolute: 0.7 10*3/uL (ref 0.1–1.0)
Monocytes Relative: 11 %
Neutro Abs: 4.5 10*3/uL (ref 1.7–7.7)
Neutrophils Relative %: 66 %
Platelets: 244 10*3/uL (ref 150–400)
RBC: 4.24 MIL/uL (ref 4.22–5.81)
RDW: 13.6 % (ref 11.5–15.5)
WBC: 6.8 10*3/uL (ref 4.0–10.5)
nRBC: 0 % (ref 0.0–0.2)

## 2018-09-01 LAB — BASIC METABOLIC PANEL
ANION GAP: 8 (ref 5–15)
Anion gap: 7 (ref 5–15)
Anion gap: 8 (ref 5–15)
BUN: 12 mg/dL (ref 6–20)
BUN: 9 mg/dL (ref 6–20)
BUN: 9 mg/dL (ref 6–20)
CO2: 23 mmol/L (ref 22–32)
CO2: 24 mmol/L (ref 22–32)
CO2: 25 mmol/L (ref 22–32)
Calcium: 7.7 mg/dL — ABNORMAL LOW (ref 8.9–10.3)
Calcium: 8.2 mg/dL — ABNORMAL LOW (ref 8.9–10.3)
Calcium: 8.2 mg/dL — ABNORMAL LOW (ref 8.9–10.3)
Chloride: 100 mmol/L (ref 98–111)
Chloride: 104 mmol/L (ref 98–111)
Chloride: 110 mmol/L (ref 98–111)
Creatinine, Ser: 0.45 mg/dL — ABNORMAL LOW (ref 0.61–1.24)
Creatinine, Ser: 0.49 mg/dL — ABNORMAL LOW (ref 0.61–1.24)
Creatinine, Ser: 0.53 mg/dL — ABNORMAL LOW (ref 0.61–1.24)
GFR calc Af Amer: >60 mL/min (ref >60)
GFR calc Af Amer: >60 mL/min (ref >60)
GFR calc non Af Amer: >60 mL/min (ref >60)
GFR calc non Af Amer: >60 mL/min (ref >60)
Glucose, Bld: 169 mg/dL — ABNORMAL HIGH (ref 70–99)
Glucose, Bld: 350 mg/dL — ABNORMAL HIGH (ref 70–99)
Glucose, Bld: 388 mg/dL — ABNORMAL HIGH (ref 70–99)
POTASSIUM: 2.8 mmol/L — AB (ref 3.5–5.1)
Potassium: 4.4 mmol/L (ref 3.5–5.1)
Potassium: 3.5 mmol/L (ref 3.5–5.1)
Sodium: 132 mmol/L — ABNORMAL LOW (ref 135–145)
Sodium: 137 mmol/L (ref 135–145)
Sodium: 140 mmol/L (ref 135–145)

## 2018-09-01 LAB — GLUCOSE, CAPILLARY
GLUCOSE-CAPILLARY: 216 mg/dL — AB (ref 70–99)
GLUCOSE-CAPILLARY: 220 mg/dL — AB (ref 70–99)
GLUCOSE-CAPILLARY: 402 mg/dL — AB (ref 70–99)
Glucose-Capillary: 223 mg/dL — ABNORMAL HIGH (ref 70–99)
Glucose-Capillary: 135 mg/dL — ABNORMAL HIGH (ref 70–99)
Glucose-Capillary: 187 mg/dL — ABNORMAL HIGH (ref 70–99)
Glucose-Capillary: 98 mg/dL (ref 70–99)
Glucose-Capillary: 302 mg/dL — ABNORMAL HIGH (ref 70–99)
Glucose-Capillary: 346 mg/dL — ABNORMAL HIGH (ref 70–99)
Glucose-Capillary: 81 mg/dL (ref 70–99)
Glucose-Capillary: >600 mg/dL (ref 70–99)
Glucose-Capillary: 119 mg/dL — ABNORMAL HIGH (ref 70–99)
Glucose-Capillary: 505 mg/dL (ref 70–99)
Glucose-Capillary: 159 mg/dL — ABNORMAL HIGH (ref 70–99)
Glucose-Capillary: 168 mg/dL — ABNORMAL HIGH (ref 70–99)
Glucose-Capillary: 233 mg/dL — ABNORMAL HIGH (ref 70–99)
Glucose-Capillary: 338 mg/dL — ABNORMAL HIGH (ref 70–99)

## 2018-09-01 LAB — URINE CULTURE: Culture: NO GROWTH

## 2018-09-01 LAB — MAGNESIUM: Magnesium: 1.8 mg/dL (ref 1.7–2.4)

## 2018-09-01 LAB — GLUCOSE, RANDOM: Glucose, Bld: 274 mg/dL — ABNORMAL HIGH (ref 70–99)

## 2018-09-01 MED ORDER — INSULIN GLARGINE 100 UNIT/ML ~~LOC~~ SOLN
20.0000 [IU] | Freq: Two times a day (BID) | SUBCUTANEOUS | Status: DC
  Administered 2018-09-01: 20 [IU] via SUBCUTANEOUS
  Filled 2018-09-01 (×2): qty 0.2

## 2018-09-01 MED ORDER — INSULIN ASPART 100 UNIT/ML ~~LOC~~ SOLN
0.0000 [IU] | Freq: Three times a day (TID) | SUBCUTANEOUS | Status: DC
  Administered 2018-09-01: 11 [IU] via SUBCUTANEOUS
  Administered 2018-09-02: 15 [IU] via SUBCUTANEOUS
  Administered 2018-09-02: 8 [IU] via SUBCUTANEOUS
  Administered 2018-09-03: 5 [IU] via SUBCUTANEOUS
  Administered 2018-09-03: 2 [IU] via SUBCUTANEOUS
  Administered 2018-09-03 – 2018-09-04 (×2): 8 [IU] via SUBCUTANEOUS
  Administered 2018-09-04: 15 [IU] via SUBCUTANEOUS
  Administered 2018-09-04: 5 [IU] via SUBCUTANEOUS
  Administered 2018-09-05: 3 [IU] via SUBCUTANEOUS
  Administered 2018-09-05: 5 [IU] via SUBCUTANEOUS
  Administered 2018-09-05: 3 [IU] via SUBCUTANEOUS
  Administered 2018-09-06: 11 [IU] via SUBCUTANEOUS
  Administered 2018-09-06: 8 [IU] via SUBCUTANEOUS

## 2018-09-01 MED ORDER — HALOPERIDOL LACTATE 5 MG/ML IJ SOLN
2.0000 mg | Freq: Four times a day (QID) | INTRAMUSCULAR | Status: DC | PRN
  Administered 2018-09-01 – 2018-09-02 (×3): 2 mg via INTRAVENOUS
  Filled 2018-09-01 (×4): qty 1

## 2018-09-01 MED ORDER — INSULIN ASPART 100 UNIT/ML ~~LOC~~ SOLN
0.0000 [IU] | Freq: Every day | SUBCUTANEOUS | Status: DC
  Administered 2018-09-01: 2 [IU] via SUBCUTANEOUS

## 2018-09-01 MED ORDER — INSULIN REGULAR(HUMAN) IN NACL 100-0.9 UT/100ML-% IV SOLN
INTRAVENOUS | Status: AC
  Administered 2018-09-01: 5.2 [IU]/h via INTRAVENOUS

## 2018-09-01 MED ORDER — INSULIN GLARGINE 100 UNIT/ML ~~LOC~~ SOLN
20.0000 [IU] | Freq: Two times a day (BID) | SUBCUTANEOUS | Status: DC
  Administered 2018-09-01 – 2018-09-06 (×11): 20 [IU] via SUBCUTANEOUS
  Filled 2018-09-01 (×11): qty 0.2

## 2018-09-01 MED ORDER — INSULIN ASPART 100 UNIT/ML ~~LOC~~ SOLN
4.0000 [IU] | Freq: Three times a day (TID) | SUBCUTANEOUS | Status: DC
  Administered 2018-09-01 – 2018-09-04 (×9): 4 [IU] via SUBCUTANEOUS

## 2018-09-01 NOTE — Progress Notes (Signed)
Pt refusing to get CBGs taken by all nurses/NTs exception to, [employer]. MD made aware.

## 2018-09-01 NOTE — Progress Notes (Signed)
Inpatient Diabetes Program Recommendations  AACE/ADA: New Consensus Statement on Inpatient Glycemic Control (2015)  Target Ranges:  Prepandial:   less than 140 mg/dL      Peak postprandial:   less than 180 mg/dL (1-2 hours)      Critically ill patients:  140 - 180 mg/dL   Lab Results  Component Value Date   GLUCAP 159 (H) 09/01/2018   HGBA1C 11.5 (H) 08/09/2018    Review of Glycemic Control  81, 119, 159 mg/dL. Has not eaten breakfast. Would not order lunch, therefore, he will get the House CHO mod. Lantus 20 units given. IV insulin to be d/ced in 2 hours. Novolog correction to be given when drip is discontinued.  Pt not cooperative with staff.   Will follow blood sugars closely.  To be transferred to floor when drip is discontinued. Discussed with MD and RN.  Thank you. Ailene Ards, RD, LDN, CDE Inpatient Diabetes Coordinator [phone]

## 2018-09-01 NOTE — Progress Notes (Signed)
PROGRESS NOTE  [name] [name].  TKW:[id number] DOB: 08/18/2000 DOA: 08/31/2018 PCP: Patient, No Pcp Per   Brief Narrative: [name] [name]. is a 18 y.o. male with a history of T2DM, recurrent MDD, personality disorder, ADHD, schizophrenia, anxiety, and seizure disorder who was brought to the ED on 3/3 by police under IVC by his cousin due to suicidal ideation. Per documentation he recently found out his girlfriend is pregnant and his parents who have been incarcerated were deported a week ago. Per his cousin, he has made multiple threats to harm himself including jumping in traffic, hanging himself or attempting to take a large quantity of pills and the patient has not been taking his medications and taking care of himself. He had recently left AMA the day before during treatment for DKA despite continued elevation of anion gap. He was admitted to the SDU on insulin infusion and has been uncooperative with healthcare, selectively mute, aggressive toward staff. Psychiatry has evaluated the patient, recommending inpatient psychiatric hospitalization following stabilization of blood sugars.   Assessment & Plan: Principal Problem:   Suicidal ideations Active Problems:   DKA (diabetic ketoacidoses) (HCC)  Poorly-controlled T1DM with recurrent DKA: HbA1c 11.5%. - Gap closed x2, will start lantus 20u BID + 4u mealtime + mod SSI and HS correction. DC insulin gtt 2 hrs post lantus.  - If CBGs stable, plan to transfer to med-surg later today.  - Diabetes coordinator involved, appreciate their help.  Medical noncompliance: Acute on chronic. Not allowing frequent CBG monitoring. Draws from PICC do not appear to be accurate.  - Will have to give haldol prn and/or soft restraints due to concern for patient's safety.  Suicidal ideation, depression:  - Due to high risk of harm to self, patient is under IVC and will transfer to inpatient psychiatry once stable. - Per psychiatry recommendations, restarted  prozac 20mg , trazodone 50mg  qHS prn. - Continue safety sitter, suicide precautions.  DVT prophylaxis: Lovenox Code Status: Full Family Communication: None Disposition Plan: Transfer to med-surg if stable after insulin gtt stopped. Patient remains under IVC.  Consultants:   Psychiatry  Diabetes coordinator  Procedures:   None  Antimicrobials:  None   Subjective: Pt verbally denies pain or other concerns but will not answer any further questions. Does comply with exam.  Objective: Vitals:   09/01/18 0400 09/01/18 0800 09/01/18 1100 09/01/18 1200  BP:  137/89 124/65   Pulse:  79 62   Resp: 17 18 13    Temp:    98 F (36.7 C)  TempSrc:    Oral  SpO2:  97% 99%   Weight:      Height:        Intake/Output Summary (Last 24 hours) at 09/01/2018 1346 Last data filed at 09/01/2018 0300 Gross per 24 hour  Intake 5985.72 ml  Output -  Net 5985.72 ml   Filed Weights   08/31/18 0146 09/01/18 0200 09/01/18 0300  Weight: 90.7 kg 82.3 kg 82.3 kg    Gen: 18 y.o. male in no distress  Pulm: Non-labored breathing room air. Clear to auscultation bilaterally.  CV: Regular rate and rhythm. No murmur, rub, or gallop. No JVD, no pedal edema. GI: Abdomen soft, non-tender, non-distended, with normoactive bowel sounds. No organomegaly or masses felt. Ext: Warm, no deformities Skin: No rashes, lesions or ulcers. Diffuse tattoos Neuro: Alert not cooperative with exam. Psych: UTD  Data Reviewed: I have personally reviewed following labs and imaging studies  CBC: Recent Labs  Lab 08/26/18  1229 08/26/18 1255 08/29/18 1224 08/29/18 1321 08/31/18 0134 09/01/18 0455  WBC 6.3  --  8.0  --  7.5 6.8  NEUTROABS 4.3  --  5.9  --   --  4.5  HGB 14.7 14.6 13.0 11.9* 13.0 12.4*  HCT 44.8 43.0 39.6 35.0* 37.9* 36.1*  MCV 85.7  --  88.8  --  84.2 85.1  PLT 297  --  268  --  276 244   Basic Metabolic Panel: Recent Labs  Lab 08/31/18 0134 08/31/18 0752 08/31/18 0754 08/31/18 1900  08/31/18 2356 09/01/18 0455 09/01/18 0955  NA 133* 135  --  132* 140 132*  --   K 3.0* 3.0*  --  3.9 2.8* 4.4  --   CL 99 109  --  96* 110 100  --   CO2 15* 17*  --  18* 23 24  --   GLUCOSE 364* 334*  --  491* 169* 388* 274*  BUN 9 5*  --  18 12 9   --   CREATININE 0.81 0.52*  --  0.74 0.45* 0.53*  --   CALCIUM 8.7* 7.0*  --  8.7* 7.7* 8.2*  --   MG  --   --  1.7  --   --  1.8  --    GFR: Estimated Creatinine Clearance: 153.4 mL/min (A) (by C-G formula based on SCr of 0.53 mg/dL (L)). Liver Function Tests: Recent Labs  Lab 08/26/18 1229 08/29/18 1224 08/31/18 0134  AST 15 14* 16  ALT 47* 28 21  ALKPHOS 93 101 92  BILITOT 1.5* 1.9* 2.2*  PROT 6.4* 6.5 6.8  ALBUMIN 3.6 3.8 3.7   No results for input(s): LIPASE, AMYLASE in the last 168 hours. No results for input(s): AMMONIA in the last 168 hours. Coagulation Profile: No results for input(s): INR, PROTIME in the last 168 hours. Cardiac Enzymes: No results for input(s): CKTOTAL, CKMB, CKMBINDEX, TROPONINI in the last 168 hours. BNP (last 3 results) No results for input(s): PROBNP in the last 8760 hours. HbA1C: No results for input(s): HGBA1C in the last 72 hours. CBG: Recent Labs  Lab 09/01/18 0946 09/01/18 1052 09/01/18 1058 09/01/18 1153 09/01/18 1309  GLUCAP 81 505* 119* 159* 168*   Lipid Profile: No results for input(s): CHOL, HDL, LDLCALC, TRIG, CHOLHDL, LDLDIRECT in the last 72 hours. Thyroid Function Tests: No results for input(s): TSH, T4TOTAL, FREET4, T3FREE, THYROIDAB in the last 72 hours. Anemia Panel: No results for input(s): VITAMINB12, FOLATE, FERRITIN, TIBC, IRON, RETICCTPCT in the last 72 hours. Urine analysis:    Component Value Date/Time   COLORURINE STRAW (A) 08/31/2018 0736   APPEARANCEUR CLEAR 08/31/2018 0736   LABSPEC 1.017 08/31/2018 0736   PHURINE 6.0 08/31/2018 0736   GLUCOSEU >=500 (A) 08/31/2018 0736   HGBUR NEGATIVE 08/31/2018 0736   BILIRUBINUR NEGATIVE 08/31/2018 0736    KETONESUR 80 (A) 08/31/2018 0736   PROTEINUR NEGATIVE 08/31/2018 0736   UROBILINOGEN 0.2 03/13/2015 0123   NITRITE NEGATIVE 08/31/2018 0736   LEUKOCYTESUR NEGATIVE 08/31/2018 0736   Recent Results (from the past 240 hour(s))  Urine culture     Status: None   Collection Time: 08/29/18 12:24 PM  Result Value Ref Range Status   Specimen Description   Final    URINE, RANDOM Performed at [facility], [address], [place], [place] [address]    Special Requests   Final    NONE Performed at [facility], [address] [place], [place] [address]  Culture   Final    NO GROWTH Performed at [facility], [address], [place], [place] [address]    Report Status 09/01/2018 FINAL  Final      Radiology Studies: [place] Ekg Site Rite  Result Date: 08/31/2018 If Site Rite image not attached, placement could not be confirmed due to current cardiac rhythm.   Scheduled Meds: . Chlorhexidine Gluconate Cloth  6 each Topical Daily  . docusate sodium  100 mg Oral Daily  . insulin aspart  0-15 Units Subcutaneous TID WC  . insulin aspart  0-5 Units Subcutaneous QHS  . insulin aspart  4 Units Subcutaneous TID WC  . insulin glargine  20 Units Subcutaneous BID  . lidocaine  15 mL Oral Once  . sodium chloride flush  10-40 mL Intracatheter Q12H   Continuous Infusions: . dextrose 5 % and 0.45% NaCl 125 mL/hr at 09/01/18 0300  . insulin       LOS: 1 day   Time spent: 35 minutes.  Tyrone Nine, MD [facility] [email or web address] Password [email or web address] 09/01/2018, 1:46 PM

## 2018-09-02 LAB — BASIC METABOLIC PANEL
Anion gap: 5 (ref 5–15)
BUN: 5 mg/dL — AB (ref 6–20)
CO2: 28 mmol/L (ref 22–32)
CREATININE: 0.49 mg/dL — AB (ref 0.61–1.24)
Calcium: 8.4 mg/dL — ABNORMAL LOW (ref 8.9–10.3)
Chloride: 107 mmol/L (ref 98–111)
GFR calc Af Amer: >60 mL/min (ref >60)
GFR calc non Af Amer: >60 mL/min (ref >60)
Glucose, Bld: 159 mg/dL — ABNORMAL HIGH (ref 70–99)
Potassium: 3.2 mmol/L — ABNORMAL LOW (ref 3.5–5.1)
SODIUM: 140 mmol/L (ref 135–145)

## 2018-09-02 LAB — GLUCOSE, CAPILLARY
GLUCOSE-CAPILLARY: 292 mg/dL — AB (ref 70–99)
Glucose-Capillary: 60 mg/dL — ABNORMAL LOW (ref 70–99)
Glucose-Capillary: 96 mg/dL (ref 70–99)
Glucose-Capillary: 120 mg/dL — ABNORMAL HIGH (ref 70–99)
Glucose-Capillary: 380 mg/dL — ABNORMAL HIGH (ref 70–99)

## 2018-09-02 MED ORDER — HYDROXYZINE HCL 50 MG PO TABS
100.0000 mg | ORAL_TABLET | Freq: Three times a day (TID) | ORAL | Status: DC | PRN
  Administered 2018-09-03 – 2018-09-06 (×3): 100 mg via ORAL
  Filled 2018-09-02 (×5): qty 2

## 2018-09-02 MED ORDER — POTASSIUM CHLORIDE CRYS ER 20 MEQ PO TBCR
40.0000 meq | EXTENDED_RELEASE_TABLET | Freq: Once | ORAL | Status: AC
  Administered 2018-09-02: 40 meq via ORAL
  Filled 2018-09-02: qty 2

## 2018-09-02 MED ORDER — SODIUM CHLORIDE 0.9 % IV SOLN
INTRAVENOUS | Status: DC
  Administered 2018-09-02: 01:00:00 via INTRAVENOUS

## 2018-09-02 NOTE — Progress Notes (Signed)
Hypoglycemic Event  Pt c/o HA and diaphoresis @ 0214.  CBG: 60 @ 0216  Treatment: pt given 4oz apple juice to drink  Symptoms: HA and diaphoresis  Follow-up CBG: Time: 0230 CBG Result: 96  Possible Reasons for Event: Pt has been sleeping most of the shift and has had decreased po intake.  Comments/MD notified: pt responded well to intervention. MD on call  made aware of hypoglycemic event, interventions, and response.     [name] [name]

## 2018-09-02 NOTE — Progress Notes (Signed)
Received pt from ICU to room 1504; pt has a sitter with him on transport. Pts room is set up for suicide precautions. Pt denies pain at this time with no s/s of distress noted. Pts assessment is unchanged from earlier documentation. Call bell is within reach and sitter at the bedside. Will continue to monitor.

## 2018-09-02 NOTE — Progress Notes (Addendum)
LCSW following for inpatient psych placement.   Patient is IVC.   Patient reports that he has been inpatient before and is followed by [facility] in the community.   No bed at [facility].  Patient faxed out.   [facility] [address]   [place], [place] CSW [phone]

## 2018-09-02 NOTE — Progress Notes (Signed)
PROGRESS NOTE  [name] [name].  YDX:[id number] DOB: [birth year] DOA: 08/31/2018 PCP: Patient, No Pcp Per   Brief Narrative: [name] [name]. is a 18 y.o. male with a history of T2DM, recurrent MDD, personality disorder, ADHD, schizophrenia, anxiety, and seizure disorder who was brought to the ED on 3/3 by police under IVC by his cousin due to suicidal ideation. Per documentation he recently found out his girlfriend is pregnant and his parents who have been incarcerated were deported a week ago. Per his cousin, he has made multiple threats to harm himself including jumping in traffic, hanging himself or attempting to take a large quantity of pills and the patient has not been taking his medications and taking care of himself. He had recently left AMA the day before during treatment for DKA despite continued elevation of anion gap. He was admitted to the SDU on insulin infusion and has been uncooperative with healthcare, selectively mute, aggressive toward staff. Psychiatry has evaluated the patient, recommending inpatient psychiatric hospitalization following stabilization of blood sugars.   Assessment & Plan: Principal Problem:   Suicidal ideations Active Problems:   DKA (diabetic ketoacidoses) (HCC)  Poorly-controlled T1DM with recurrent DKA: HbA1c 11.5%. Improving control with lantus, SSI.  - Continue lantus 20u BID, may consolidate to once daily dosing if remains on this dose (was supposed to be on 30u BID as outpatient). Hypoglycemic episode noted at 2am, early morning glucose at goal. Will DC HS correction.  - continue novolog 4u + mod SSI qAC. - Diabetes coordinator involved, appreciate their help.  Medical noncompliance: Acute on chronic. This has improved over past 24 hours. - No intervention warranted at present. Would have to give haldol prn and/or soft restraints due to concern for patient's safety if refusing care.  Suicidal ideation, depression:  - Due to high risk of harm to  self, patient is under IVC and will transfer to inpatient psychiatry once a bed is available. - Per psychiatry recommendations, restarted prozac 20mg , trazodone 50mg  qHS prn. Monitor for tolerance, efficacy and augment doses if needed. - Patent attorney, suicide precautions.  OUD: No evidence of withdrawal at this time.  DVT prophylaxis: Lovenox Code Status: Full Family Communication: None Disposition Plan: Patient remains under IVC and is medically cleared for discharge to inpatient psychiatric facility per psychiatry's recommendations.  Consultants:   Psychiatry  Diabetes coordinator  Procedures:   None  Antimicrobials:  None   Subjective: No new complaints. Has been slightly more amenable to interventions. Glucose down to 60, diaphoretic with headache at 2am, improved with po intake.    Objective: Vitals:   09/02/18 0530 09/02/18 0600 09/02/18 0744 09/02/18 1157  BP: (!) 121/54  (!) 101/52 127/73  Pulse: 65     Resp:  17 (!) 25 17  Temp: 98.1 F (36.7 C)     TempSrc: Oral     SpO2:   99% 99%  Weight:      Height:        Intake/Output Summary (Last 24 hours) at 09/02/2018 1435 Last data filed at 09/02/2018 1300 Gross per 24 hour  Intake 3364.48 ml  Output -  Net 3364.48 ml   Filed Weights   08/31/18 0146 09/01/18 0200 09/01/18 0300  Weight: 90.7 kg 82.3 kg 82.3 kg   Gen: 18 y.o. male in no distress Pulm: Nonlabored breathing room air. Clear. CV: Regular rate and rhythm. No murmur, rub, or gallop. No JVD, no dependent edema. GI: Abdomen soft, non-tender, non-distended, with normoactive bowel  sounds.  Ext: Warm, no deformities Skin: No rashes, lesions or ulcers on visualized skin. Neuro: Alert and oriented. No focal neurological deficits. Psych: Judgement and insight appear limited, largely nonverbal.  Data Reviewed: I have personally reviewed following labs and imaging studies  CBC: Recent Labs  Lab 08/29/18 1224 08/29/18 1321 08/31/18 0134  09/01/18 0455  WBC 8.0  --  7.5 6.8  NEUTROABS 5.9  --   --  4.5  HGB 13.0 11.9* 13.0 12.4*  HCT 39.6 35.0* 37.9* 36.1*  MCV 88.8  --  84.2 85.1  PLT 268  --  276 244   Basic Metabolic Panel: Recent Labs  Lab 08/31/18 0754 08/31/18 1900 08/31/18 2356 09/01/18 0455 09/01/18 0955 09/01/18 1658 09/02/18 0500  NA  --  132* 140 132*  --  137 140  K  --  3.9 2.8* 4.4  --  3.5 3.2*  CL  --  96* 110 100  --  104 107  CO2  --  18* 23 24  --  25 28  GLUCOSE  --  491* 169* 388* 274* 350* 159*  BUN  --  18 12 9   --  9 5*  CREATININE  --  0.74 0.45* 0.53*  --  0.49* 0.49*  CALCIUM  --  8.7* 7.7* 8.2*  --  8.2* 8.4*  MG 1.7  --   --  1.8  --   --   --    GFR: Estimated Creatinine Clearance: 153.4 mL/min (A) (by C-G formula based on SCr of 0.49 mg/dL (L)). Liver Function Tests: Recent Labs  Lab 08/29/18 1224 08/31/18 0134  AST 14* 16  ALT 28 21  ALKPHOS 101 92  BILITOT 1.9* 2.2*  PROT 6.5 6.8  ALBUMIN 3.8 3.7   No results for input(s): LIPASE, AMYLASE in the last 168 hours. No results for input(s): AMMONIA in the last 168 hours. Coagulation Profile: No results for input(s): INR, PROTIME in the last 168 hours. Cardiac Enzymes: No results for input(s): CKTOTAL, CKMB, CKMBINDEX, TROPONINI in the last 168 hours. BNP (last 3 results) No results for input(s): PROBNP in the last 8760 hours. HbA1C: No results for input(s): HGBA1C in the last 72 hours. CBG: Recent Labs  Lab 09/01/18 2204 09/02/18 0216 09/02/18 0230 09/02/18 0746 09/02/18 1143  GLUCAP 220* 60* 96 120* 380*   Lipid Profile: No results for input(s): CHOL, HDL, LDLCALC, TRIG, CHOLHDL, LDLDIRECT in the last 72 hours. Thyroid Function Tests: No results for input(s): TSH, T4TOTAL, FREET4, T3FREE, THYROIDAB in the last 72 hours. Anemia Panel: No results for input(s): VITAMINB12, FOLATE, FERRITIN, TIBC, IRON, RETICCTPCT in the last 72 hours. Urine analysis:    Component Value Date/Time   COLORURINE STRAW (A)  08/31/2018 0736   APPEARANCEUR CLEAR 08/31/2018 0736   LABSPEC 1.017 08/31/2018 0736   PHURINE 6.0 08/31/2018 0736   GLUCOSEU >=500 (A) 08/31/2018 0736   HGBUR NEGATIVE 08/31/2018 0736   BILIRUBINUR NEGATIVE 08/31/2018 0736   KETONESUR 80 (A) 08/31/2018 0736   PROTEINUR NEGATIVE 08/31/2018 0736   UROBILINOGEN 0.2 03/13/2015 0123   NITRITE NEGATIVE 08/31/2018 0736   LEUKOCYTESUR NEGATIVE 08/31/2018 0736   Recent Results (from the past 240 hour(s))  Urine culture     Status: None   Collection Time: 08/29/18 12:24 PM  Result Value Ref Range Status   Specimen Description   Final    URINE, RANDOM Performed at [facility], [address] [place], [place] [address]    Special Requests  Final    NONE Performed at [facility], [address], [place], [place] [address]    Culture   Final    NO GROWTH Performed at [facility], [address] [name], [place] [address]    Report Status 09/01/2018 FINAL  Final      Radiology Studies: [place] Ekg Site Rite  Result Date: 08/31/2018 If Site Rite image not attached, placement could not be confirmed due to current cardiac rhythm.   Scheduled Meds: . Chlorhexidine Gluconate Cloth  6 each Topical Daily  . docusate sodium  100 mg Oral Daily  . insulin aspart  0-15 Units Subcutaneous TID WC  . insulin aspart  0-5 Units Subcutaneous QHS  . insulin aspart  4 Units Subcutaneous TID WC  . insulin glargine  20 Units Subcutaneous BID  . lidocaine  15 mL Oral Once  . sodium chloride flush  10-40 mL Intracatheter Q12H   Continuous Infusions:    LOS: 2 days   Time spent: 35 minutes.  Tyrone Nine, MD [facility] [email or web address] Password [email or web address] 09/02/2018, 2:35 PM

## 2018-09-02 NOTE — Progress Notes (Signed)
Inpatient Diabetes Program Recommendations  AACE/ADA: New Consensus Statement on Inpatient Glycemic Control (2015)  Target Ranges:  Prepandial:   less than 140 mg/dL      Peak postprandial:   less than 180 mg/dL (1-2 hours)      Critically ill patients:  140 - 180 mg/dL   Lab Results  Component Value Date   GLUCAP 120 (H) 09/02/2018   HGBA1C 11.5 (H) 08/09/2018    Review of Glycemic Control  Hypoglycemia of 60 mg/dL noted at 7493. May be prudent to reduce basal insulin.  Inpatient Diabetes Program Recommendations:    Decrease Lantus to 18 units bid. Psych consult  Will continue to follow.   Thank you. Ailene Ards, RD, LDN, CDE Inpatient Diabetes Coordinator [phone]

## 2018-09-03 NOTE — Progress Notes (Signed)
HS blood sugar 239.

## 2018-09-03 NOTE — Progress Notes (Signed)
Inpatient Diabetes Program Recommendations  AACE/ADA: New Consensus Statement on Inpatient Glycemic Control (2015)  Target Ranges:  Prepandial:   less than 140 mg/dL      Peak postprandial:   less than 180 mg/dL (1-2 hours)      Critically ill patients:  140 - 180 mg/dL   Lab Results  Component Value Date   GLUCAP 292 (H) 09/02/2018   HGBA1C 11.5 (H) 08/09/2018    Review of Glycemic Control  230 mg/dL this am. Spoke with pt this am. Pt was selectively mute. Blood sugars slightly elevated.  Awaiting psych bed.  Inpatient Diabetes Program Recommendations:     If post-prandials > 180 mg/dL, increase Novolog to 6 units tidwc.  Continue to monitor.  Thank you. Ailene Ards, RD, LDN, CDE Inpatient Diabetes Coordinator [phone]

## 2018-09-03 NOTE — Progress Notes (Signed)
PROGRESS NOTE  [name] [name].  JSE:[id number] DOB: [birth year] DOA: 08/31/2018 PCP: Patient, No Pcp Per   Brief Narrative: [name] [name]. is a 18 y.o. male with a history of T2DM, recurrent MDD, personality disorder, ADHD, schizophrenia, anxiety, and seizure disorder who was brought to the ED on 3/3 by police under IVC by his cousin due to suicidal ideation. Per documentation he recently found out his girlfriend is pregnant and his parents who have been incarcerated were deported a week ago. Per his cousin, he has made multiple threats to harm himself including jumping in traffic, hanging himself or attempting to take a large quantity of pills and the patient has not been taking his medications and taking care of himself. He had recently left AMA the day before during treatment for DKA despite continued elevation of anion gap. He was admitted to the SDU on insulin infusion and has been uncooperative with healthcare, selectively mute, aggressive toward staff. Psychiatry has evaluated the patient, recommending inpatient psychiatric hospitalization following stabilization of blood sugars.   Assessment & Plan: Principal Problem:   Suicidal ideations Active Problems:   DKA (diabetic ketoacidoses) (HCC)  Poorly-controlled T1DM with recurrent DKA: HbA1c 11.5%. Improving control with lantus, SSI.  - Continue lantus 20u BID, may consolidate to once daily dosing if remains on this dose (was supposed to be on 30u BID as outpatient). Hypoglycemic episode 3/5, stopped HS correction. Will increase mealtime insulin. Continue mod SSI qAC. - Diabetes coordinator involved, appreciate their help.  Medical noncompliance: Acute on chronic. This has improved over past 24 hours. - No intervention warranted at present. Would have to give haldol prn and/or soft restraints due to concern for patient's safety if refusing care.  Suicidal ideation, depression:  - Due to high risk of harm to self, patient is under  IVC and will transfer to inpatient psychiatry once a bed is available. - Per psychiatry recommendations, restarted prozac 20mg , trazodone 50mg  qHS prn. Monitor for tolerance, efficacy and augment doses if needed. Plan to reconsult psychiatry if there is change in patient's status or 3/10 (weekly contact) - Patent attorney, suicide precautions.  OUD: No evidence of withdrawal at this time.  DVT prophylaxis: Lovenox Code Status: Full Family Communication: None Disposition Plan: Patient remains under IVC and is medically cleared for discharge to inpatient psychiatric facility per psychiatry's recommendations.  Consultants:   Psychiatry  Diabetes coordinator  Procedures:   None  Antimicrobials:  None   Subjective: Not willing to engage this morning. No overnight events reported by nursing.   Objective: Vitals:   09/02/18 2022 09/02/18 2134 09/03/18 0539 09/03/18 1404  BP: 132/87  117/66 114/68  Pulse: 72  68 66  Resp: 20  18 18   Temp:  98 F (36.7 C) 97.8 F (36.6 C) 98.1 F (36.7 C)  TempSrc:  Oral Oral Oral  SpO2: 100%  100% 99%  Weight:      Height:        Intake/Output Summary (Last 24 hours) at 09/03/2018 1601 Last data filed at 09/03/2018 0830 Gross per 24 hour  Intake 960 ml  Output -  Net 960 ml   Filed Weights   08/31/18 0146 09/01/18 0200 09/01/18 0300  Weight: 90.7 kg 82.3 kg 82.3 kg   Gen: 18 y.o. male in no distress, not making eye contact or responding. Does spontaneously move about the room. Pulm: Nonlabored breathing room air. Clear. CV: Regular rate and rhythm. No murmur, rub, or gallop. No JVD, no dependent  edema. Ext: Warm, no deformities visible or palpable Skin: No new rashes, lesions or ulcers on visualized skin. Neuro: Alert, not cooperative with exam, steady gait.  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    Data Reviewed: I have personally reviewed following labs and imaging  studies  CBC: Recent Labs  Lab 08/29/18 1224 08/29/18 1321 08/31/18 0134 09/01/18 0455  WBC 8.0  --  7.5 6.8  NEUTROABS 5.9  --   --  4.5  HGB 13.0 11.9* 13.0 12.4*  HCT 39.6 35.0* 37.9* 36.1*  MCV 88.8  --  84.2 85.1  PLT 268  --  276 244   Basic Metabolic Panel: Recent Labs  Lab 08/31/18 0754 08/31/18 1900 08/31/18 2356 09/01/18 0455 09/01/18 0955 09/01/18 1658 09/02/18 0500  NA  --  132* 140 132*  --  137 140  K  --  3.9 2.8* 4.4  --  3.5 3.2*  CL  --  96* 110 100  --  104 107  CO2  --  18* 23 24  --  25 28  GLUCOSE  --  491* 169* 388* 274* 350* 159*  BUN  --  18 12 9   --  9 5*  CREATININE  --  0.74 0.45* 0.53*  --  0.49* 0.49*  CALCIUM  --  8.7* 7.7* 8.2*  --  8.2* 8.4*  MG 1.7  --   --  1.8  --   --   --    GFR: Estimated Creatinine Clearance: 153.4 mL/min (A) (by C-G formula based on SCr of 0.49 mg/dL (L)). Liver Function Tests: Recent Labs  Lab 08/29/18 1224 08/31/18 0134  AST 14* 16  ALT 28 21  ALKPHOS 101 92  BILITOT 1.9* 2.2*  PROT 6.5 6.8  ALBUMIN 3.8 3.7   No results for input(s): LIPASE, AMYLASE in the last 168 hours. No results for input(s): AMMONIA in the last 168 hours. Coagulation Profile: No results for input(s): INR, PROTIME in the last 168 hours. Cardiac Enzymes: No results for input(s): CKTOTAL, CKMB, CKMBINDEX, TROPONINI in the last 168 hours. BNP (last 3 results) No results for input(s): PROBNP in the last 8760 hours. HbA1C: No results for input(s): HGBA1C in the last 72 hours. CBG: Recent Labs  Lab 09/02/18 0216 09/02/18 0230 09/02/18 0746 09/02/18 1143 09/02/18 1620  GLUCAP 60* 96 120* 380* 292*   Lipid Profile: No results for input(s): CHOL, HDL, LDLCALC, TRIG, CHOLHDL, LDLDIRECT in the last 72 hours. Thyroid Function Tests: No results for input(s): TSH, T4TOTAL, FREET4, T3FREE, THYROIDAB in the last 72 hours. Anemia Panel: No results for input(s): VITAMINB12, FOLATE, FERRITIN, TIBC, IRON, RETICCTPCT in the last 72  hours. Urine analysis:    Component Value Date/Time   COLORURINE STRAW (A) 08/31/2018 0736   APPEARANCEUR CLEAR 08/31/2018 0736   LABSPEC 1.017 08/31/2018 0736   PHURINE 6.0 08/31/2018 0736   GLUCOSEU >=500 (A) 08/31/2018 0736   HGBUR NEGATIVE 08/31/2018 0736   BILIRUBINUR NEGATIVE 08/31/2018 0736   KETONESUR 80 (A) 08/31/2018 0736   PROTEINUR NEGATIVE 08/31/2018 0736   UROBILINOGEN 0.2 03/13/2015 0123   NITRITE NEGATIVE 08/31/2018 0736   LEUKOCYTESUR NEGATIVE 08/31/2018 0736   Recent Results (from the past 240 hour(s))  Urine culture     Status: None   Collection Time: 08/29/18 12:24 PM  Result Value Ref Range Status   Specimen Description   Final    URINE, RANDOM Performed at [facility], [address]  [place], [place] [address]    Special Requests   Final    NONE Performed at [facility], [address] [place], [place] [address]    Culture   Final    NO GROWTH Performed at [facility], [address] [place], [place] [address]    Report Status 09/01/2018 FINAL  Final      Radiology Studies: No results found.  Scheduled Meds: . Chlorhexidine Gluconate Cloth  6 each Topical Daily  . docusate sodium  100 mg Oral Daily  . insulin aspart  0-15 Units Subcutaneous TID WC  . insulin aspart  4 Units Subcutaneous TID WC  . insulin glargine  20 Units Subcutaneous BID  . lidocaine  15 mL Oral Once  . sodium chloride flush  10-40 mL Intracatheter Q12H   Continuous Infusions:    LOS: 3 days   Time spent: 25 minutes.  Tyrone Nine, MD [facility] [email or web address] Password [email or web address] 09/03/2018, 4:01 PM

## 2018-09-03 NOTE — Clinical Social Work Note (Signed)
Clinical Social Work Assessment  Patient Details  Name: [name] [name]. MRN: [id number] Date of Birth: May 21, 2001  Date of referral:  09/03/18               Reason for consult:  Mental Health Concerns                Permission sought to share information with:  Facility Art therapist granted to share information::  Yes, Verbal Permission Granted  Name::        Agency::  Inpatient psych facilities  Relationship::     Contact Information:     Housing/Transportation Living arrangements for the past 2 months:  Apartment Source of Information:  Patient Patient Interpreter Needed:  None Criminal Activity/Legal Involvement Pertinent to Current Situation/Hospitalization:  No - Comment as needed Significant Relationships:  Friend Lives with:  Roommate Do you feel safe going back to the place where you live?  Yes Need for family participation in patient care:  No (Coment)  Care giving concerns:  Patient not compliant with insulin and psych meds.    Social Worker assessment / plan:  LCSW consulted for inpatient psych placement.   Patient admitted for DKA. Patient was IVC''d by his cousin. Per notes patient endorsed SI prior to admission.   LCSW met at bedside with patient. Patient has sitter for safety. Patient is lying in bed unengaged.  Patient reports that he has been hospitalized several times before and has been to [facility], [facility] and [facility].   Patient reports that he was living in an apartment with a roommate prior to hospital admission.    Employment status:  Disabled (Comment on whether or not currently receiving Disability) Insurance information:  Medicaid In [place] PT Recommendations:  Not assessed at this time Information / Referral to community resources:     Patient/Family's Response to care:  Patient is hopeful for placement soon.   Patient/Family's Understanding of and Emotional Response to Diagnosis, Current Treatment, and Prognosis:   Patient is knowledgeable about his current condition and reason for inpatient psych.   Emotional Assessment Appearance:  Appears younger than stated age Attitude/Demeanor/Rapport:    Affect (typically observed):  Depressed, Guarded Orientation:  Oriented to Self, Oriented to Place, Oriented to  Time, Oriented to Situation Alcohol / Substance use:  Not Applicable Psych involvement (Current and /or in the community):  Yes (Comment)  Discharge Needs  Concerns to be addressed:  Mental Health Concerns, Lack of Support Readmission within the last 30 days:  No Current discharge risk:  Psychiatric Illness Barriers to Discharge:  Psych Bed not available   Servando Snare, LCSW 09/03/2018, 9:45 AM

## 2018-09-03 NOTE — Progress Notes (Signed)
Patient has ACT team.   LCSW left vm for ACT to potentially assist with placement.   LCSW will continue to follow.   Beulah Gandy [place] CSW [phone]

## 2018-09-03 NOTE — Progress Notes (Signed)
Pt CBG 502. Pt did just eat snack, PB and Graham crackers (pt had not been eating meals)  will recheck in 1 hour. Lantus given. On call [place] paged. Will continue to monitor.

## 2018-09-04 MED ORDER — INSULIN ASPART 100 UNIT/ML ~~LOC~~ SOLN
8.0000 [IU] | Freq: Three times a day (TID) | SUBCUTANEOUS | Status: DC
  Administered 2018-09-05 – 2018-09-06 (×4): 8 [IU] via SUBCUTANEOUS

## 2018-09-04 NOTE — Progress Notes (Signed)
CBG's for today:  204 at 0800, 363 at 1200, 287 at 1600.

## 2018-09-04 NOTE — Progress Notes (Signed)
Met with pt at his request for update on placement. Pt well-known to CSW from previous encounters in system EDs/[facility]. Pt was alert and oriented, good eye contact. States, "I don't feel like I need another hospital, I don't even feel suicidal or anything." CSW questioned as to statements made on IVC (by his "cousin Suezanne Jacquet" with whom he reports living), and pt states, "I don't know where that came from. I didn't even know I was getting IVC'd."  Pt' most recent inpatient psychiatric admission was at [facility] from 03/2018-06/2018. States he has been staying with his cousin and that "things are complicated" with his parents (reported to psychiatrist that his parents were deported recently, however other conversations with staff indicated pt has been staying with his father. Unclear his family situation currently or timeline of events since his release from [facility]).  Pt has ACTT with [facility]- CSW attempted to reach today but unable due to weekend. Previous CSW left voicemails yesterday in attempt to coordinate care.  Pt reports "I haven't seen [facility] since I got home in January" however he reports he has been compliant with his psych meds.   Admitted for DKA and has history of poor compliance with treatment for his diabetes- CBG levels will be significant barriers to psych admission, along with pt's hx of aggression (no aggression currently or this admission but history documented throughout chart in other encounters in system).  Pt is on probation (or parole, CSW unclear) in [place], states PO's name is Jessee Avers. Will attempt to contact as case progresses as pt has ankle monitor, also sometimes barrier to [place] out of county.  Pt requests he be re-evaluated for release from IVC as he "doesn't and never felt suicidal."  Sharren Bridge, MSW, LCSW Clinical Social Work 09/04/2018 [phone] weekend coverage for [phone]

## 2018-09-04 NOTE — Progress Notes (Signed)
Pt's CBG's today are:  204 at 0800, 363 at 1200, 287 at 1600.

## 2018-09-04 NOTE — Progress Notes (Signed)
PROGRESS NOTE  [name] [name].  AQT:[id number] DOB: 2000/07/29 DOA: 08/31/2018 PCP: Patient, No Pcp Per   Brief Narrative: [name] [name]. is a 18 y.o. male with a history of T2DM, recurrent MDD, personality disorder, ADHD, schizophrenia, anxiety, and seizure disorder who was brought to the ED on 3/3 by police under IVC by his cousin due to suicidal ideation. Per documentation he recently found out his girlfriend is pregnant and his parents who have been incarcerated were deported a week ago. Per his cousin, he has made multiple threats to harm himself including jumping in traffic, hanging himself or attempting to take a large quantity of pills and the patient has not been taking his medications and taking care of himself. He had recently left AMA the day before during treatment for DKA despite continued elevation of anion gap. He was admitted to the SDU on insulin infusion and has been uncooperative with healthcare, selectively mute, aggressive toward staff. Psychiatry has evaluated the patient, recommending inpatient psychiatric hospitalization following stabilization of blood sugars.   Assessment & Plan: Principal Problem:   Suicidal ideations Active Problems:   DKA (diabetic ketoacidoses) (HCC)  Poorly-controlled T1DM with recurrent DKA: HbA1c 11.5%. Improving control with lantus, SSI.  - Continue lantus 20u BID, may consolidate to once daily dosing if remains on this dose (was supposed to be on 30u BID as outpatient). Hypoglycemic episode 3/5, stopped HS correction, further increase mealtime insulin currently. Increasing long acting may be helpful but patient's po intake is too erratic currently.   Medical noncompliance: Acute on chronic. This has improved over past 24 hours. - No intervention warranted at present. Would have to give haldol prn and/or soft restraints due to concern for patient's safety if refusing care.  Suicidal ideation, depression:  - Due to high risk of harm to  self, patient is under IVC and will transfer to inpatient psychiatry once a bed is available. - Per psychiatry recommendations, restarted prozac 20mg , trazodone 50mg  qHS prn. Monitor for tolerance, efficacy and augment doses if needed. Plan to reconsult psychiatry if there is change in patient's status or 3/10 (weekly contact) - Patent attorney, suicide precautions.  OUD: No evidence of withdrawal at this time.  DVT prophylaxis: Lovenox Code Status: Full Family Communication: None Disposition Plan: Patient remains under IVC and is medically cleared for discharge to inpatient psychiatric facility per psychiatry's recommendations. Will consider reevaluation on 3/9.  Consultants:   Psychiatry  Diabetes coordinator  Procedures:   None  Antimicrobials:  None   Subjective: More interactive, wishing to speak with psychiatry and CSW about disposition. Eating erratically. No low blood sugars noted. No agitation.  Objective: Vitals:   09/03/18 1404 09/03/18 2005 09/04/18 0427 09/04/18 1200  BP: 114/68 121/80 102/64 100/63  Pulse: 66 89 68 74  Resp: 18 16 17 18   Temp: 98.1 F (36.7 C) 98.7 F (37.1 C) 98.1 F (36.7 C) 97.9 F (36.6 C)  TempSrc: Oral Oral Oral Oral  SpO2: 99% 100% 98% 100%  Weight:      Height:        Intake/Output Summary (Last 24 hours) at 09/04/2018 1914 Last data filed at 09/03/2018 2230 Gross per 24 hour  Intake 956 ml  Output -  Net 956 ml   Filed Weights   08/31/18 0146 09/01/18 0200 09/01/18 0300  Weight: 90.7 kg 82.3 kg 82.3 kg   Gen: 18 y.o. male in no distress Pulm: Nonlabored breathing room air. Clear. CV: Regular rate and rhythm. No  murmur, rub, or gallop. No JVD, no dependent edema. GI: Abdomen soft, non-tender, non-distended, with normoactive bowel sounds.  Ext: Warm, no deformities Skin: No rashes, lesions or ulcers on visualized skin. Neuro: Alert and oriented. No focal neurological deficits. Psych: Judgement and insight limited.  More interactive.  Data Reviewed: I have personally reviewed following labs and imaging studies  CBC: Recent Labs  Lab 08/29/18 1224 08/29/18 1321 08/31/18 0134 09/01/18 0455  WBC 8.0  --  7.5 6.8  NEUTROABS 5.9  --   --  4.5  HGB 13.0 11.9* 13.0 12.4*  HCT 39.6 35.0* 37.9* 36.1*  MCV 88.8  --  84.2 85.1  PLT 268  --  276 244   Basic Metabolic Panel: Recent Labs  Lab 08/31/18 0754 08/31/18 1900 08/31/18 2356 09/01/18 0455 09/01/18 0955 09/01/18 1658 09/02/18 0500  NA  --  132* 140 132*  --  137 140  K  --  3.9 2.8* 4.4  --  3.5 3.2*  CL  --  96* 110 100  --  104 107  CO2  --  18* 23 24  --  25 28  GLUCOSE  --  491* 169* 388* 274* 350* 159*  BUN  --  18 12 9   --  9 5*  CREATININE  --  0.74 0.45* 0.53*  --  0.49* 0.49*  CALCIUM  --  8.7* 7.7* 8.2*  --  8.2* 8.4*  MG 1.7  --   --  1.8  --   --   --    GFR: Estimated Creatinine Clearance: 153.4 mL/min (A) (by C-G formula based on SCr of 0.49 mg/dL (L)). Liver Function Tests: Recent Labs  Lab 08/29/18 1224 08/31/18 0134  AST 14* 16  ALT 28 21  ALKPHOS 101 92  BILITOT 1.9* 2.2*  PROT 6.5 6.8  ALBUMIN 3.8 3.7   No results for input(s): LIPASE, AMYLASE in the last 168 hours. No results for input(s): AMMONIA in the last 168 hours. Coagulation Profile: No results for input(s): INR, PROTIME in the last 168 hours. Cardiac Enzymes: No results for input(s): CKTOTAL, CKMB, CKMBINDEX, TROPONINI in the last 168 hours. BNP (last 3 results) No results for input(s): PROBNP in the last 8760 hours. HbA1C: No results for input(s): HGBA1C in the last 72 hours. CBG: Recent Labs  Lab 09/02/18 0216 09/02/18 0230 09/02/18 0746 09/02/18 1143 09/02/18 1620  GLUCAP 60* 96 120* 380* 292*   Lipid Profile: No results for input(s): CHOL, HDL, LDLCALC, TRIG, CHOLHDL, LDLDIRECT in the last 72 hours. Thyroid Function Tests: No results for input(s): TSH, T4TOTAL, FREET4, T3FREE, THYROIDAB in the last 72 hours. Anemia Panel: No  results for input(s): VITAMINB12, FOLATE, FERRITIN, TIBC, IRON, RETICCTPCT in the last 72 hours. Urine analysis:    Component Value Date/Time   COLORURINE STRAW (A) 08/31/2018 0736   APPEARANCEUR CLEAR 08/31/2018 0736   LABSPEC 1.017 08/31/2018 0736   PHURINE 6.0 08/31/2018 0736   GLUCOSEU >=500 (A) 08/31/2018 0736   HGBUR NEGATIVE 08/31/2018 0736   BILIRUBINUR NEGATIVE 08/31/2018 0736   KETONESUR 80 (A) 08/31/2018 0736   PROTEINUR NEGATIVE 08/31/2018 0736   UROBILINOGEN 0.2 03/13/2015 0123   NITRITE NEGATIVE 08/31/2018 0736   LEUKOCYTESUR NEGATIVE 08/31/2018 0736   Recent Results (from the past 240 hour(s))  Urine culture     Status: None   Collection Time: 08/29/18 12:24 PM  Result Value Ref Range Status   Specimen Description   Final    URINE, RANDOM Performed at [facility]  [facility], [address], [place], [place] [address]    Special Requests   Final    NONE Performed at [facility], [address], [place], [place] [address]    Culture   Final    NO GROWTH Performed at [facility], [address], [place], [place] [address]    Report Status 09/01/2018 FINAL  Final      Radiology Studies: No results found.  Scheduled Meds: . Chlorhexidine Gluconate Cloth  6 each Topical Daily  . docusate sodium  100 mg Oral Daily  . insulin aspart  0-15 Units Subcutaneous TID WC  . insulin aspart  4 Units Subcutaneous TID WC  . insulin glargine  20 Units Subcutaneous BID  . lidocaine  15 mL Oral Once  . sodium chloride flush  10-40 mL Intracatheter Q12H   Continuous Infusions:    LOS: 4 days   Time spent: 25 minutes.  Tyrone Nine, MD [facility] [email or web address] Password [facility] 09/04/2018, 7:14 PM

## 2018-09-05 LAB — GLUCOSE, CAPILLARY
GLUCOSE-CAPILLARY: 502 mg/dL — AB (ref 70–99)
GLUCOSE-CAPILLARY: 363 mg/dL — AB (ref 70–99)
GLUCOSE-CAPILLARY: 194 mg/dL — AB (ref 70–99)
Glucose-Capillary: 239 mg/dL — ABNORMAL HIGH (ref 70–99)
Glucose-Capillary: 230 mg/dL — ABNORMAL HIGH (ref 70–99)
Glucose-Capillary: 275 mg/dL — ABNORMAL HIGH (ref 70–99)
Glucose-Capillary: 145 mg/dL — ABNORMAL HIGH (ref 70–99)
Glucose-Capillary: 222 mg/dL — ABNORMAL HIGH (ref 70–99)
Glucose-Capillary: 204 mg/dL — ABNORMAL HIGH (ref 70–99)
Glucose-Capillary: 287 mg/dL — ABNORMAL HIGH (ref 70–99)
Glucose-Capillary: 466 mg/dL — ABNORMAL HIGH (ref 70–99)
Glucose-Capillary: 220 mg/dL — ABNORMAL HIGH (ref 70–99)
Glucose-Capillary: 191 mg/dL — ABNORMAL HIGH (ref 70–99)
Glucose-Capillary: 333 mg/dL — ABNORMAL HIGH (ref 70–99)

## 2018-09-05 MED ORDER — GLUCERNA SHAKE PO LIQD
237.0000 mL | Freq: Three times a day (TID) | ORAL | Status: DC
  Administered 2018-09-05 – 2018-09-06 (×3): 237 mL via ORAL
  Filled 2018-09-05 (×4): qty 237

## 2018-09-05 MED ORDER — AQUAPHOR EX OINT
TOPICAL_OINTMENT | Freq: Two times a day (BID) | CUTANEOUS | Status: DC | PRN
  Administered 2018-09-06: 14:00:00 via TOPICAL
  Filled 2018-09-05 (×2): qty 50

## 2018-09-05 NOTE — Progress Notes (Signed)
PROGRESS NOTE  [name] [name].  KGM:[id number] DOB: Oct 22, 2000 DOA: 08/31/2018 PCP: Patient, No Pcp Per   Brief Narrative: [name] [name]. is a 18 y.o. male with a history of T2DM, recurrent MDD, personality disorder, ADHD, schizophrenia, anxiety, and seizure disorder who was brought to the ED on 3/3 by police under IVC by his cousin due to suicidal ideation. Per documentation he recently found out his girlfriend is pregnant and his parents who have been incarcerated were deported a week ago. Per his cousin, he has made multiple threats to harm himself including jumping in traffic, hanging himself or attempting to take a large quantity of pills and the patient has not been taking his medications and taking care of himself. He had recently left AMA the day before during treatment for DKA despite continued elevation of anion gap. He was admitted to the SDU on insulin infusion and has been uncooperative with healthcare, selectively mute, aggressive toward staff. Psychiatry has evaluated the patient, recommending inpatient psychiatric hospitalization following stabilization of blood sugars.   Assessment & Plan: Principal Problem:   Suicidal ideations Active Problems:   DKA (diabetic ketoacidoses) (HCC)  Poorly-controlled T1DM with recurrent DKA: HbA1c 11.5%. Improving control with lantus, SSI.  - Continue lantus 20u BID (was supposed to be on 30u BID as outpatient). Hypoglycemic episode 3/5, stopped HS correction, further increase mealtime insulin to 8u. No changes today  Medical noncompliance: Acute on chronic. This has improved over past 24 hours. - No intervention warranted at present. Would have to give haldol prn and/or soft restraints due to concern for patient's safety if refusing care.  Suicidal ideation, depression:  - Due to high risk of harm to self, patient is under IVC and will transfer to inpatient psychiatry once a bed is available. He may transfer out of county when under IVC,  but not if transferring voluntarily. - Per psychiatry recommendations, restarted prozac 20mg , trazodone 50mg  qHS prn. Monitor for tolerance, efficacy and augment doses if needed. Plan to reconsult psychiatry if there is change in patient's status or 3/10 (weekly contact) - Patent attorney, suicide precautions.  OUD: No evidence of withdrawal at this time.  Tattoos:  - Aquaphor prn  DVT prophylaxis: Lovenox Code Status: Full Family Communication: None Disposition Plan: Patient remains under IVC and is medically cleared for discharge to inpatient psychiatric facility per psychiatry's recommendations.  Consultants:   Psychiatry  Diabetes coordinator  Procedures:   None  Antimicrobials:  None   Subjective: Wants ointment for tattoos and to have glucerna between meals. Glucose values not transferring to EMR but have improved without hypoglycemia.  Objective: Vitals:   09/05/18 0332 09/05/18 0751 09/05/18 1152 09/05/18 1600  BP: 121/83 (!) 104/58 101/62 135/85  Pulse: 94 68 71 (!) 101  Resp: 18 16 16 16   Temp: 97.8 F (36.6 C) 98 F (36.7 C) 98.2 F (36.8 C) 98.2 F (36.8 C)  TempSrc: Oral Oral    SpO2: 98% 99% 98% 98%  Weight:      Height:        Intake/Output Summary (Last 24 hours) at 09/05/2018 1656 Last data filed at 09/05/2018 0300 Gross per 24 hour  Intake 2097 ml  Output -  Net 2097 ml   Filed Weights   08/31/18 0146 09/01/18 0200 09/01/18 0300  Weight: 90.7 kg 82.3 kg 82.3 kg   Gen: 18 y.o. male in no distress Pulm: Nonlabored breathing room air.  CV: No JVD, no dependent edema. Ext: Warm, no deformities  Skin: No rashes, lesions or ulcers on visualized skin. Neuro: Alert and oriented. No focal neurological deficits. Psych: Limited insight, poor judgment, euthymic, normal behavior.  Data Reviewed: I have personally reviewed following labs and imaging studies  CBC: Recent Labs  Lab 08/31/18 0134 09/01/18 0455  WBC 7.5 6.8  NEUTROABS  --   4.5  HGB 13.0 12.4*  HCT 37.9* 36.1*  MCV 84.2 85.1  PLT 276 244   Basic Metabolic Panel: Recent Labs  Lab 08/31/18 0754 08/31/18 1900 08/31/18 2356 09/01/18 0455 09/01/18 0955 09/01/18 1658 09/02/18 0500  NA  --  132* 140 132*  --  137 140  K  --  3.9 2.8* 4.4  --  3.5 3.2*  CL  --  96* 110 100  --  104 107  CO2  --  18* 23 24  --  25 28  GLUCOSE  --  491* 169* 388* 274* 350* 159*  BUN  --  18 12 9   --  9 5*  CREATININE  --  0.74 0.45* 0.53*  --  0.49* 0.49*  CALCIUM  --  8.7* 7.7* 8.2*  --  8.2* 8.4*  MG 1.7  --   --  1.8  --   --   --    GFR: Estimated Creatinine Clearance: 153.4 mL/min (A) (by C-G formula based on SCr of 0.49 mg/dL (L)). Liver Function Tests: Recent Labs  Lab 08/31/18 0134  AST 16  ALT 21  ALKPHOS 92  BILITOT 2.2*  PROT 6.8  ALBUMIN 3.7   No results for input(s): LIPASE, AMYLASE in the last 168 hours. No results for input(s): AMMONIA in the last 168 hours. Coagulation Profile: No results for input(s): INR, PROTIME in the last 168 hours. Cardiac Enzymes: No results for input(s): CKTOTAL, CKMB, CKMBINDEX, TROPONINI in the last 168 hours. BNP (last 3 results) No results for input(s): PROBNP in the last 8760 hours. HbA1C: No results for input(s): HGBA1C in the last 72 hours. CBG: Recent Labs  Lab 09/02/18 0230 09/02/18 0746 09/02/18 1143 09/02/18 1620 09/05/18 1602  GLUCAP 96 120* 380* 292* 194*   Lipid Profile: No results for input(s): CHOL, HDL, LDLCALC, TRIG, CHOLHDL, LDLDIRECT in the last 72 hours. Thyroid Function Tests: No results for input(s): TSH, T4TOTAL, FREET4, T3FREE, THYROIDAB in the last 72 hours. Anemia Panel: No results for input(s): VITAMINB12, FOLATE, FERRITIN, TIBC, IRON, RETICCTPCT in the last 72 hours. Urine analysis:    Component Value Date/Time   COLORURINE STRAW (A) 08/31/2018 0736   APPEARANCEUR CLEAR 08/31/2018 0736   LABSPEC 1.017 08/31/2018 0736   PHURINE 6.0 08/31/2018 0736   GLUCOSEU >=500 (A)  08/31/2018 0736   HGBUR NEGATIVE 08/31/2018 0736   BILIRUBINUR NEGATIVE 08/31/2018 0736   KETONESUR 80 (A) 08/31/2018 0736   PROTEINUR NEGATIVE 08/31/2018 0736   UROBILINOGEN 0.2 03/13/2015 0123   NITRITE NEGATIVE 08/31/2018 0736   LEUKOCYTESUR NEGATIVE 08/31/2018 0736   Recent Results (from the past 240 hour(s))  Urine culture     Status: None   Collection Time: 08/29/18 12:24 PM  Result Value Ref Range Status   Specimen Description   Final    URINE, RANDOM Performed at [facility], [address], [place], [place] [address]    Special Requests   Final    NONE Performed at [facility], [address] [place], [place] [address]    Culture   Final    NO GROWTH Performed at [facility], [address], [place], [place] [address]    Report Status 09/01/2018 FINAL  Final      Radiology Studies: No results found.  Scheduled Meds: . Chlorhexidine Gluconate Cloth  6 each Topical Daily  . docusate sodium  100 mg Oral Daily  . insulin aspart  0-15 Units Subcutaneous TID WC  . insulin aspart  8 Units Subcutaneous TID WC  . insulin glargine  20 Units Subcutaneous BID  . lidocaine  15 mL Oral Once  . sodium chloride flush  10-40 mL Intracatheter Q12H   Continuous Infusions:    LOS: 5 days   Time spent: 15 minutes.  Tyrone Nine, MD [facility] [email or web address] Password [facility] 09/05/2018, 4:56 PM

## 2018-09-06 DIAGNOSIS — F4325 Adjustment disorder with mixed disturbance of emotions and conduct: Secondary | ICD-10-CM

## 2018-09-06 LAB — GLUCOSE, CAPILLARY
Glucose-Capillary: 257 mg/dL — ABNORMAL HIGH (ref 70–99)
Glucose-Capillary: 322 mg/dL — ABNORMAL HIGH (ref 70–99)

## 2018-09-06 MED ORDER — INSULIN GLARGINE 100 UNIT/ML ~~LOC~~ SOLN: 20.0000 [IU] | Freq: Two times a day (BID) | SUBCUTANEOUS | 0 refills | Status: DC

## 2018-09-06 MED ORDER — INSULIN ASPART 100 UNIT/ML ~~LOC~~ SOLN
10.0000 [IU] | Freq: Three times a day (TID) | SUBCUTANEOUS | Status: DC
  Administered 2018-09-06: 10 [IU] via SUBCUTANEOUS

## 2018-09-06 MED ORDER — INSULIN ASPART 100 UNIT/ML FLEXPEN: 0.0000 [IU] | PEN_INJECTOR | Freq: Three times a day (TID) | SUBCUTANEOUS | 0 refills | Status: DC

## 2018-09-06 NOTE — Consult Note (Signed)
[facility] Psych Consult Progress Note  09/06/2018 12:45 PM [name] [name].  MRN:  [id number] Subjective:   [name] [name] was admitted with DKA. Psychiatry was consulted for SI. He was IVC'd by his cousin for suicide attempt in the setting of multiple psychosocial stressors. He would not participate in the interview and would only speak to the diabetes coordinator.   On interview, [name] [name] reports that his mood has been relatively stable. He reports that he did not attempt suicide. He reports difficulty with ambulating after he left the hospital AMA on a prior admission. He fell in the street while crossing the road when walking home. He denies that he intentionally tried to harm himself. He denies current SI, HI or AVH. He does admit to a history of cutting to relieve pain. He does not cut anymore since he does not feel like it is beneficial for an emotional release. He denies problems with sleep or appetite. He denies intentional poor compliance with his diabetes medications. He reports that his appetite is generally poor or he does not eat regular meals when he is busy which leads to fluctuations in his blood glucose. He plans to follow up with his ACT team and probation officer on discharge.  He reports that his cousin returned to [place] after he was IVC'd by him. He does not understand why he placed him under involuntary commitment. He provides verbal consent to speak to his mother. She was contacted by phone. She denies safety concerns for her son. She reports that he has been doing relatively well although he still struggles with depression at times. She believes that his cousin lacks knowledge of his mental health and therefore felt uncomfortable with the patient's fluctuations in mood.  Principal Problem: Adjustment disorder with mixed disturbance of emotions and conduct Diagnosis:  Principal Problem:   Suicidal ideations Active Problems:   DKA (diabetic ketoacidoses) (HCC)  Total Time spent with  patient: 15 minutes  Past Psychiatric History: Recurrent MDD with psychosis, personality disorder, ADHD and schizophrenia.   Past Medical History:  Past Medical History:  Diagnosis Date  . ADD (attention deficit disorder)   . Allergy   . Anxiety   . Asthma   . Depression   . Epileptic seizure (HCC)    "both petit and grand mal; last sz ~ 2 wk ago" (06/17/2013)  . Headache(784.0)    "2-3 times/wk usually" (06/17/2013)  . Heart murmur    "heard a slight one earlier today" (06/17/2013)  . Homeless   . Migraine    "maybe once/month; it's severe" (06/17/2013)  . ODD (oppositional defiant disorder)   . Sickle cell trait (HCC)   . Type I diabetes mellitus (HCC)    "that's what they're thinking now" (06/17/2013)  . Vision abnormalities    "takes him longer to focus cause; from his sz" (06/17/2013)    Past Surgical History:  Procedure Laterality Date  . CIRCUMCISION  2000  . FINGER SURGERY Left 2001   "crushed pinky; had to repair it" (06/17/2013)   Family History:  Family History  Problem Relation Age of Onset  . Asthma Mother   . COPD Mother   . Other Mother        possible autoimmune, unclear  . Diabetes Maternal Grandmother   . Heart disease Maternal Grandmother   . Hypertension Maternal Grandmother   . Mental illness Maternal Grandmother   . Heart disease Maternal Grandfather   . Hyperlipidemia Paternal Grandmother   . Hyperlipidemia Paternal Grandfather   .  Migraines Sister        Hemiplegic Migraines    Family Psychiatric  History: As listed above.  Social History:  Social History   Substance and Sexual Activity  Alcohol Use No  . Alcohol/week: 0.0 standard drinks     Social History   Substance and Sexual Activity  Drug Use No    Social History   Socioeconomic History  . Marital status: Single    Spouse name: Not on file  . Number of children: Not on file  . Years of education: Not on file  . Highest education level: Not on file  Occupational  History  . Not on file  Social Needs  . Financial resource strain: Not on file  . Food insecurity:    Worry: Not on file    Inability: Not on file  . Transportation needs:    Medical: Not on file    Non-medical: Not on file  Tobacco Use  . Smoking status: Passive Smoke Exposure - Never Smoker  . Smokeless tobacco: Never Used  . Tobacco comment: Mom and dad smoke outside   Substance and Sexual Activity  . Alcohol use: No    Alcohol/week: 0.0 standard drinks  . Drug use: No  . Sexual activity: Never  Lifestyle  . Physical activity:    Days per week: Not on file    Minutes per session: Not on file  . Stress: Not on file  Relationships  . Social connections:    Talks on phone: Not on file    Gets together: Not on file    Attends religious service: Not on file    Active member of club or organization: Not on file    Attends meetings of clubs or organizations: Not on file    Relationship status: Not on file  Other Topics Concern  . Not on file  Social History Narrative   Lives at home with father, stepmother, 47 year old brother.    Sleep: Good  Appetite:  Fair  Current Medications: Current Facility-Administered Medications  Medication Dose Route Frequency Provider Last Rate Last Dose  . acetaminophen (TYLENOL) tablet 650 mg  650 mg Oral Q6H PRN Tyrone Nine, MD      . Chlorhexidine Gluconate Cloth 2 % PADS 6 each  6 each Topical Daily Tyrone Nine, MD   6 each at 09/05/18 1047  . docusate sodium (COLACE) capsule 100 mg  100 mg Oral Daily Hazeline Junker B, MD      . feeding supplement (GLUCERNA SHAKE) (GLUCERNA SHAKE) liquid 237 mL  237 mL Oral TID BM Hazeline Junker B, MD   237 mL at 09/06/18 0828  . haloperidol lactate (HALDOL) injection 2 mg  2 mg Intravenous Q6H PRN Tyrone Nine, MD   2 mg at 09/02/18 1524  . hydrOXYzine (ATARAX/VISTARIL) tablet 100 mg  100 mg Oral TID PRN Tyrone Nine, MD   100 mg at 09/06/18 0131  . insulin aspart (novoLOG) injection 0-15 Units  0-15  Units Subcutaneous TID WC Tyrone Nine, MD   11 Units at 09/06/18 1227  . insulin aspart (novoLOG) injection 10 Units  10 Units Subcutaneous TID WC Tyrone Nine, MD   10 Units at 09/06/18 1226  . insulin glargine (LANTUS) injection 20 Units  20 Units Subcutaneous BID Tyrone Nine, MD   20 Units at 09/06/18 0827  . lidocaine (XYLOCAINE) 2 % viscous mouth solution 15 mL  15 mL Oral Once Tyrone Nine,  MD      . mineral oil-hydrophilic petrolatum (AQUAPHOR) ointment   Topical BID PRN Tyrone Nine, MD      . ondansetron [facility]) injection 4 mg  4 mg Intravenous Q6H PRN Hazeline Junker B, MD      . sodium chloride flush (NS) 0.9 % injection 10-40 mL  10-40 mL Intracatheter Q12H Tyrone Nine, MD   10 mL at 09/06/18 1031  . sodium chloride flush (NS) 0.9 % injection 10-40 mL  10-40 mL Intracatheter PRN Tyrone Nine, MD      . traZODone (DESYREL) tablet 100 mg  100 mg Oral QHS PRN Tyrone Nine, MD   100 mg at 09/06/18 0131    Lab Results:  Results for orders placed or performed during the hospital encounter of 08/31/18 (from the past 48 hour(s))  Glucose, capillary     Status: Abnormal   Collection Time: 09/04/18  4:15 PM  Result Value Ref Range   Glucose-Capillary 287 (H) 70 - 99 mg/dL  Glucose, capillary     Status: Abnormal   Collection Time: 09/04/18  9:10 PM  Result Value Ref Range   Glucose-Capillary 466 (H) 70 - 99 mg/dL  Glucose, capillary     Status: Abnormal   Collection Time: 09/05/18  6:47 AM  Result Value Ref Range   Glucose-Capillary 220 (H) 70 - 99 mg/dL  Glucose, capillary     Status: Abnormal   Collection Time: 09/05/18 10:54 AM  Result Value Ref Range   Glucose-Capillary 191 (H) 70 - 99 mg/dL  Glucose, capillary     Status: Abnormal   Collection Time: 09/05/18  4:02 PM  Result Value Ref Range   Glucose-Capillary 194 (H) 70 - 99 mg/dL  Glucose, capillary     Status: Abnormal   Collection Time: 09/05/18  9:13 PM  Result Value Ref Range   Glucose-Capillary 333 (H) 70 -  99 mg/dL  Glucose, capillary     Status: Abnormal   Collection Time: 09/06/18  7:47 AM  Result Value Ref Range   Glucose-Capillary 257 (H) 70 - 99 mg/dL  Glucose, capillary     Status: Abnormal   Collection Time: 09/06/18 11:16 AM  Result Value Ref Range   Glucose-Capillary 322 (H) 70 - 99 mg/dL   Comment 1 Notify RN     Blood Alcohol level:  Lab Results  Component Value Date   ETH <10 08/31/2018   ETH <10 08/29/2018    Musculoskeletal: Strength & Muscle Tone: within normal limits Gait & Station: normal Patient leans: N/A  Psychiatric Specialty Exam: Physical Exam  Nursing note and vitals reviewed. Constitutional: He is oriented to person, place, and time. He appears well-developed and well-nourished.  HENT:  Head: Normocephalic and atraumatic.  Neck: Normal range of motion.  Respiratory: Effort normal.  Musculoskeletal: Normal range of motion.  Neurological: He is alert and oriented to person, place, and time.  Psychiatric: He has a normal mood and affect. His speech is normal and behavior is normal. Judgment and thought content normal. Cognition and memory are normal.    Review of Systems  Cardiovascular: Negative for chest pain.  Gastrointestinal: Negative for abdominal pain, nausea and vomiting.  Psychiatric/Behavioral: Negative for depression and suicidal ideas.  All other systems reviewed and are negative.   Blood pressure 135/70, pulse 86, temperature (!) 97.5 F (36.4 C), temperature source Oral, resp. rate 18, height 5\' 10"  (1.778 m), weight 82.3 kg, SpO2 100 %.Body mass index is 26.03 kg/m.  General Appearance: Fairly Groomed, young, Hispanic male, wearing paper hospital scrubs with multicolor dyed hair, multiple body tattoos and some that are new/healing, eyebrow piercing and corrective lenses who is sitting on a bench. NAD.    Eye Contact:  Good  Speech:  Clear and Coherent and Normal Rate  Volume:  Normal  Mood:  Euthymic  Affect:  Appropriate and  Congruent  Thought Process:  Goal Directed, Linear and Descriptions of Associations: Intact  Orientation:  Full (Time, Place, and Person)  Thought Content:  Logical  Suicidal Thoughts:  No  Homicidal Thoughts:  No  Memory:  Immediate;   Good Recent;   Good Remote;   Good  Judgement:  Fair  Insight:  Fair  Psychomotor Activity:  Normal  Concentration:  Concentration: Good and Attention Span: Good  Recall:  Good  Fund of Knowledge:  Good  Language:  Good  Akathisia:  No  Handed:  Right  AIMS (if indicated):   N/A  Assets:  Communication Skills Desire for Improvement Housing Resilience Social Support  ADL's:  Intact  Cognition:  WNL  Sleep:   Okay   Assessment:  [name] Mcnee. is a 18 y.o. male who was admitted with DKA and suicide attempt. He was IVC'd by his cousin due to concern that patient was not caring for himself and attempting suicide by overdosing on medication. Patient denies recent suicide attempt and reports that his mood has been relatively stable. His affect and behavior are appropriate. He is future oriented. He is able to safety plan. His mother was contacted for collateral. She reports that he has been doing well and she denies concerns for his safety. He no longer warrants inpatient psychiatric hospitalization.    Treatment Plan Summary: Continue Trazodone 100 mg qhs PRN for sleep.  -Continue Atarax 100 mg TID PRN for anxiety.  -Patient will follow up with ACT PSI team.  -Psychiatry will sign off on patient at this time. Please consult psychiatry again as needed.   [name] Beach, DO 09/06/2018, 12:45 PM

## 2018-09-06 NOTE — Discharge Summary (Signed)
Physician Discharge Summary  [name] [name]. JYN:[id number] DOB: September 04, 2000 DOA: 08/31/2018  PCP: Patient, No Pcp Per  Admit date: 08/31/2018 Discharge date: 09/06/2018  Admitted From: Home Disposition: Home   Recommendations for Outpatient Follow-up:  1. Follow up with endocrinology and/or PCP as soon as possible for ongoing management of poorly controlled IDDM.  Home Health: None Equipment/Devices: None Discharge Condition: Stable CODE STATUS: Full Diet recommendation: Carb-modified  Brief/Interim Summary: [name] [name]. is a 18 y.o. male with a history of T2DM, recurrent MDD, personality disorder, ADHD, schizophrenia, anxiety, and seizure disorder who was brought to the ED on 3/3 by police under IVC by his cousin due to suicidal ideation. Per documentation he recently found out his girlfriend is pregnant and his parents who have been incarcerated were deported a week ago. Per his cousin, he has made multiple threats to harm himself including jumping in traffic, hanging himself or attempting to take a large quantity of pills and the patient has not been taking his medications and taking care of himself. He had recently left AMA the day before during treatment for DKA despite continued elevation of anion gap. He was admitted to the SDU on insulin infusion and has been uncooperative with healthcare, selectively mute, aggressive toward staff. Psychiatry initially evaluated the patient, recommending inpatient psychiatric hospitalization following stabilization of blood sugars. Glycemic control has improved with the modifications detailed below. The patient's behavior improved as well. Psychiatry reevaluated the patient 3/9 and is recommending rescinding the IVC and discharging home.   Discharge Diagnoses:  Principal Problem:   Adjustment disorder with mixed disturbance of emotions and conduct Active Problems:   Suicidal ideations   DKA (diabetic ketoacidoses) (HCC)  Poorly-controlled  T1DM with hyperglycemia/recurrent DKA: HbA1c 11.5%. Improving control with lantus, SSI.  - Continue lantus 20u BID (was supposed to be on 30u BID as outpatient), though this can be condensed to once daily if no further upward adjustments are necessary. Based on dietary requests while inpatient which were largely restricted, I anticipate needing more insulin as an outpatient. He also had a hypoglycemic event on 3/5.  - Add 8 units mealtime insulin to his home moderate SSI.  - Emphasized need for follow up with endocrinology as Dr. [name] is board certified in both pediatric and adult endocrinology.   Medical noncompliance: Acute on chronic. This has improved through the course of hospitalization.  - Significant risk factor for future complications, frequent inappropriate healthcare utilization, and readmission.  Suicidal ideation, depression:  - Due to high risk of harm to self, patient was under IVC at admission. After psychiatry reevaluation, IVC is rescinded and patient free to leave. He's medically stable and discharged in stable condition.   OUD: No evidence of withdrawal at this time.  Tattoos:  - Aquaphor prn  Discharge Instructions Discharge Instructions    Diet - low sodium heart healthy   Complete by:  As directed    Discharge instructions   Complete by:  As directed    You will need to follow up with your endocrinologist closely to prevent further hospitalizations and severe consequences of uncontrolled diabetes including blindness, kidney failure requiring dialysis, amputations, heart attacks, strokes, and painful neuropathy among others. The insulin you were on while admitted should be continued at discharge.  - Take lantus 20 units twice a day until you follow up with your doctor. This is lower than the 30 units twice a day you were supposed to be taking.  - Take novolog three times a  day with a meal. Take 8 units as long as you eat most of the meal AND take additional  novolog insulin based on your blood sugar reading at the time of the meal. - Continue other medications as you were and follow up with outpatient psychiatry.  - If your symptoms return, seek medical attention right away.   Increase activity slowly   Complete by:  As directed      Allergies as of 09/06/2018      Reactions   Bee Venom Anaphylaxis   Fish Allergy Anaphylaxis   Shellfish Allergy Anaphylaxis   Tea Anaphylaxis      Medication List    TAKE these medications   albuterol 108 (90 Base) MCG/ACT inhaler Commonly known as:  PROVENTIL HFA;VENTOLIN HFA Inhale 1 puff into the lungs every 4 (four) hours as needed for wheezing or shortness of breath.   glucose blood test strip Commonly known as:  Accu-Chek Guide Check glucose 6x daily: For blood sugar checks   hydrOXYzine 50 MG tablet Commonly known as:  ATARAX/VISTARIL Take 100 mg by mouth daily as needed for anxiety.   insulin aspart 100 UNIT/ML FlexPen Commonly known as:  NovoLOG FlexPen Inject 0-15 Units into the skin 3 (three) times daily with meals. In addition to 8 units given if you eat >50% of a meal What changed:  additional instructions   insulin glargine 100 UNIT/ML injection Commonly known as:  LANTUS Inject 0.2 mLs (20 Units total) into the skin 2 (two) times daily. What changed:  how much to take   Suboxone 8-2 MG Film Generic drug:  Buprenorphine HCl-Naloxone HCl Place 1 Film under the tongue 2 (two) times daily.   traZODone 50 MG tablet Commonly known as:  DESYREL Take 100 mg by mouth at bedtime as needed for sleep.      Follow-up Information    David Stall, MD. Schedule an appointment as soon as possible for a visit.   Specialty:  Pediatrics Contact information: [address] [place] [address] [place] [address] [phone]          Allergies  Allergen Reactions  . Bee Venom Anaphylaxis  . Fish Allergy Anaphylaxis  . Shellfish Allergy Anaphylaxis  . Tea Anaphylaxis     Consultations:  Psychiatry  Diabetes coordinator  Procedures/Studies: Dg Chest 1 View  Result Date: 08/29/2018 CLINICAL DATA:  DKA EXAM: CHEST  1 VIEW COMPARISON:  08/18/2018 FINDINGS: The heart size and mediastinal contours are within normal limits. Both lungs are clear. The visualized skeletal structures are unremarkable. IMPRESSION: No active disease. Electronically Signed   By: Elige Ko   On: 08/29/2018 13:16   Dg Chest 2 View  Result Date: 08/18/2018 CLINICAL DATA:  Mid chest pain for over 1 week. EXAM: CHEST - 2 VIEW COMPARISON:  08/09/2018 FINDINGS: The heart size and mediastinal contours are within normal limits. Both lungs are clear. The visualized skeletal structures are unremarkable. IMPRESSION: No active cardiopulmonary disease. Electronically Signed   By: Tollie Eth M.D.   On: 08/18/2018 01:35   Ct Abdomen Pelvis W Contrast  Result Date: 08/29/2018 CLINICAL DATA:  Right-sided abdominal pain and fever for 2 days. Suspected appendicitis. EXAM: CT ABDOMEN AND PELVIS WITH CONTRAST TECHNIQUE: Multidetector CT imaging of the abdomen and pelvis was performed using the standard protocol following bolus administration of intravenous contrast. CONTRAST:  ISOVUE-300 IOPAMIDOL (ISOVUE-300) INJECTION 61% COMPARISON:  07/16/2017 FINDINGS: Lower Chest: No acute findings. Hepatobiliary: No hepatic masses identified. Gallbladder is unremarkable. Pancreas:  No mass or inflammatory changes. Spleen: Within normal limits in size and appearance. Adrenals/Urinary Tract: No masses identified. No evidence of hydronephrosis. Mild diffuse bladder wall thickening is noted, suspicious for cystitis. Stomach/Bowel: No evidence of obstruction, inflammatory process or abnormal fluid collections. Normal appendix visualized. Vascular/Lymphatic: No pathologically enlarged lymph nodes. No abdominal aortic aneurysm. Reproductive:  No mass or other significant abnormality. Other:  None. Musculoskeletal:  No  suspicious bone lesions identified. IMPRESSION: 1. Mild diffuse bladder wall thickening, suspicious for cystitis. Suggest correlation with urinalysis. 2. No evidence of appendicitis or other acute findings. Electronically Signed   By: Myles Rosenthal M.D.   On: 08/29/2018 14:54   Dg Chest Port 1 View  Result Date: 08/09/2018 CLINICAL DATA:  18 y/o  M; chest pain. EXAM: PORTABLE CHEST 1 VIEW COMPARISON:  03/22/2018 chest radiograph. FINDINGS: Stable heart size and mediastinal contours are within normal limits. Both lungs are clear. The visualized skeletal structures are unremarkable. IMPRESSION: No active disease. Electronically Signed   By: Mitzi Hansen M.D.   On: 08/09/2018 06:14   [place] Ekg Site Rite  Result Date: 08/31/2018 If Site Rite image not attached, placement could not be confirmed due to current cardiac rhythm.   Subjective: Feels well, CBGs improved. RN reports he continues to request high sugar beverages and foods. Denies SI and HI. Behavior has improved significantly over the past 72 hours per staff and my evaluations.  Discharge Exam: Vitals:   09/06/18 0936 09/06/18 1416  BP: 135/70 129/81  Pulse: 86 99  Resp: 18 18  Temp: (!) 97.5 F (36.4 C) 98.4 F (36.9 C)  SpO2: 100% 100%   General: Pt is alert, awake, not in acute distress Cardiovascular: RRR, S1/S2 +, no rubs, no gallops Respiratory: CTA bilaterally, no wheezing, no rhonchi Abdominal: Soft, NT, ND, bowel sounds + Extremities: No edema, no cyanosis Psych: Alert, oriented, conversant, interactive, no AVH, SI or HI. Linear thought with normal expression, euthymic mood with congruent affect.  Labs: BNP (last 3 results) No results for input(s): BNP in the last 8760 hours. Basic Metabolic Panel: Recent Labs  Lab 08/31/18 0754 08/31/18 1900 08/31/18 2356 09/01/18 0455 09/01/18 0955 09/01/18 1658 09/02/18 0500  NA  --  132* 140 132*  --  137 140  K  --  3.9 2.8* 4.4  --  3.5 3.2*  CL  --  96* 110 100   --  104 107  CO2  --  18* 23 24  --  25 28  GLUCOSE  --  491* 169* 388* 274* 350* 159*  BUN  --  18 12 9   --  9 5*  CREATININE  --  0.74 0.45* 0.53*  --  0.49* 0.49*  CALCIUM  --  8.7* 7.7* 8.2*  --  8.2* 8.4*  MG 1.7  --   --  1.8  --   --   --    Liver Function Tests: Recent Labs  Lab 08/31/18 0134  AST 16  ALT 21  ALKPHOS 92  BILITOT 2.2*  PROT 6.8  ALBUMIN 3.7   No results for input(s): LIPASE, AMYLASE in the last 168 hours. No results for input(s): AMMONIA in the last 168 hours. CBC: Recent Labs  Lab 08/31/18 0134 09/01/18 0455  WBC 7.5 6.8  NEUTROABS  --  4.5  HGB 13.0 12.4*  HCT 37.9* 36.1*  MCV 84.2 85.1  PLT 276 244   Cardiac Enzymes: No results for input(s): CKTOTAL, CKMB, CKMBINDEX, TROPONINI in the last 168  hours. BNP: Invalid input(s): POCBNP CBG: Recent Labs  Lab 09/05/18 1054 09/05/18 1602 09/05/18 2113 09/06/18 0747 09/06/18 1116  GLUCAP 191* 194* 333* 257* 322*   D-Dimer No results for input(s): DDIMER in the last 72 hours. Hgb A1c No results for input(s): HGBA1C in the last 72 hours. Lipid Profile No results for input(s): CHOL, HDL, LDLCALC, TRIG, CHOLHDL, LDLDIRECT in the last 72 hours. Thyroid function studies No results for input(s): TSH, T4TOTAL, T3FREE, THYROIDAB in the last 72 hours.  Invalid input(s): FREET3 Anemia work up No results for input(s): VITAMINB12, FOLATE, FERRITIN, TIBC, IRON, RETICCTPCT in the last 72 hours. Urinalysis    Component Value Date/Time   COLORURINE STRAW (A) 08/31/2018 0736   APPEARANCEUR CLEAR 08/31/2018 0736   LABSPEC 1.017 08/31/2018 0736   PHURINE 6.0 08/31/2018 0736   GLUCOSEU >=500 (A) 08/31/2018 0736   HGBUR NEGATIVE 08/31/2018 0736   BILIRUBINUR NEGATIVE 08/31/2018 0736   KETONESUR 80 (A) 08/31/2018 0736   PROTEINUR NEGATIVE 08/31/2018 0736   UROBILINOGEN 0.2 03/13/2015 0123   NITRITE NEGATIVE 08/31/2018 0736   LEUKOCYTESUR NEGATIVE 08/31/2018 0736    Microbiology Recent Results  (from the past 240 hour(s))  Urine culture     Status: None   Collection Time: 08/29/18 12:24 PM  Result Value Ref Range Status   Specimen Description   Final    URINE, RANDOM Performed at [facility], [address], [place], [place] [address]    Special Requests   Final    NONE Performed at [facility], [address], [place], [place] [address]    Culture   Final    NO GROWTH Performed at [facility], [address], [place], [place] [address]    Report Status 09/01/2018 FINAL  Final    Time coordinating discharge: Approximately 40 minutes  Tyrone Nine, MD  [facility] 09/06/2018, 7:41 PM

## 2018-09-06 NOTE — Progress Notes (Signed)
Patient given discharge instructions, and verbalized an understanding of all discharge instructions.  Patient agrees with discharge plan, and is being discharged in stable medical condition.  Patient escorted to front door, and given taxi voucher to be picked up.

## 2018-09-06 NOTE — Progress Notes (Addendum)
Patient is psychiatrically cleared by Dr. [name].   Dr. [name] signed change in commitment form.   LCSw faxed form to [employer].   LCSW notified attending of status change.   LCSW provided taxi voucher for dc.   LCSW signing off.   No CSW needs.   Beulah Gandy [place] Long CSW [phone]

## 2018-09-15 ENCOUNTER — Observation Stay (HOSPITAL_COMMUNITY)
Admission: EM | Admit: 2018-09-15 | Discharge: 2018-09-16 | Disposition: A | Discharge status: Home / Self Care | Payer: Medicaid Other | Attending: Internal Medicine | Admitting: Internal Medicine

## 2018-09-15 ENCOUNTER — Emergency Department (HOSPITAL_COMMUNITY): Payer: Medicaid Other

## 2018-09-15 ENCOUNTER — Other Ambulatory Visit: Payer: Self-pay

## 2018-09-15 ENCOUNTER — Encounter (HOSPITAL_COMMUNITY): Payer: Self-pay | Admitting: Emergency Medicine

## 2018-09-15 DIAGNOSIS — F988 Other specified behavioral and emotional disorders with onset usually occurring in childhood and adolescence: Secondary | ICD-10-CM | POA: Insufficient documentation

## 2018-09-15 DIAGNOSIS — G43909 Migraine, unspecified, not intractable, without status migrainosus: Secondary | ICD-10-CM | POA: Insufficient documentation

## 2018-09-15 DIAGNOSIS — F419 Anxiety disorder, unspecified: Secondary | ICD-10-CM | POA: Insufficient documentation

## 2018-09-15 DIAGNOSIS — F603 Borderline personality disorder: Secondary | ICD-10-CM | POA: Insufficient documentation

## 2018-09-15 DIAGNOSIS — Z68.41 Body mass index (BMI) pediatric, greater than or equal to 95th percentile for age: Secondary | ICD-10-CM | POA: Insufficient documentation

## 2018-09-15 DIAGNOSIS — Z79899 Other long term (current) drug therapy: Secondary | ICD-10-CM | POA: Insufficient documentation

## 2018-09-15 DIAGNOSIS — N529 Male erectile dysfunction, unspecified: Secondary | ICD-10-CM | POA: Diagnosis not present

## 2018-09-15 DIAGNOSIS — F333 Major depressive disorder, recurrent, severe with psychotic symptoms: Secondary | ICD-10-CM | POA: Insufficient documentation

## 2018-09-15 DIAGNOSIS — D573 Sickle-cell trait: Secondary | ICD-10-CM | POA: Insufficient documentation

## 2018-09-15 DIAGNOSIS — Z9114 Patient's other noncompliance with medication regimen: Secondary | ICD-10-CM | POA: Insufficient documentation

## 2018-09-15 DIAGNOSIS — N179 Acute kidney failure, unspecified: Secondary | ICD-10-CM | POA: Diagnosis not present

## 2018-09-15 DIAGNOSIS — Z8249 Family history of ischemic heart disease and other diseases of the circulatory system: Secondary | ICD-10-CM | POA: Diagnosis not present

## 2018-09-15 DIAGNOSIS — Z794 Long term (current) use of insulin: Secondary | ICD-10-CM | POA: Insufficient documentation

## 2018-09-15 DIAGNOSIS — F902 Attention-deficit hyperactivity disorder, combined type: Secondary | ICD-10-CM | POA: Insufficient documentation

## 2018-09-15 DIAGNOSIS — J069 Acute upper respiratory infection, unspecified: Secondary | ICD-10-CM

## 2018-09-15 DIAGNOSIS — F1120 Opioid dependence, uncomplicated: Secondary | ICD-10-CM | POA: Insufficient documentation

## 2018-09-15 DIAGNOSIS — Z7722 Contact with and (suspected) exposure to environmental tobacco smoke (acute) (chronic): Secondary | ICD-10-CM | POA: Insufficient documentation

## 2018-09-15 DIAGNOSIS — E1010 Type 1 diabetes mellitus with ketoacidosis without coma: Principal | ICD-10-CM | POA: Insufficient documentation

## 2018-09-15 DIAGNOSIS — E1110 Type 2 diabetes mellitus with ketoacidosis without coma: Secondary | ICD-10-CM | POA: Diagnosis present

## 2018-09-15 LAB — CBG MONITORING, ED
Glucose-Capillary: 410 mg/dL — ABNORMAL HIGH (ref 70–99)
Glucose-Capillary: 492 mg/dL — ABNORMAL HIGH (ref 70–99)
Glucose-Capillary: 462 mg/dL — ABNORMAL HIGH (ref 70–99)
Glucose-Capillary: 379 mg/dL — ABNORMAL HIGH (ref 70–99)
Glucose-Capillary: 372 mg/dL — ABNORMAL HIGH (ref 70–99)
Glucose-Capillary: 233 mg/dL — ABNORMAL HIGH (ref 70–99)

## 2018-09-15 LAB — BASIC METABOLIC PANEL
Anion gap: 18 — ABNORMAL HIGH (ref 5–15)
Anion gap: 16 — ABNORMAL HIGH (ref 5–15)
BUN: 17 mg/dL (ref 6–20)
BUN: 14 mg/dL (ref 6–20)
BUN: 11 mg/dL (ref 6–20)
CALCIUM: 8.5 mg/dL — AB (ref 8.9–10.3)
CALCIUM: 8.5 mg/dL — AB (ref 8.9–10.3)
CO2: <7 mmol/L — ABNORMAL LOW (ref 22–32)
CO2: 12 mmol/L — AB (ref 22–32)
CO2: 15 mmol/L — ABNORMAL LOW (ref 22–32)
Calcium: 9.1 mg/dL (ref 8.9–10.3)
Chloride: 100 mmol/L (ref 98–111)
Chloride: 110 mmol/L (ref 98–111)
Chloride: 105 mmol/L (ref 98–111)
Creatinine, Ser: 1.84 mg/dL — ABNORMAL HIGH (ref 0.61–1.24)
Creatinine, Ser: 1.58 mg/dL — ABNORMAL HIGH (ref 0.61–1.24)
Creatinine, Ser: 1.32 mg/dL — ABNORMAL HIGH (ref 0.61–1.24)
GFR calc Af Amer: >60 mL/min (ref >60)
GFR calc Af Amer: >60 mL/min (ref >60)
GFR calc Af Amer: >60 mL/min (ref >60)
GFR calc non Af Amer: 52 mL/min — ABNORMAL LOW (ref >60)
GFR calc non Af Amer: >60 mL/min (ref >60)
GFR calc non Af Amer: >60 mL/min (ref >60)
Glucose, Bld: 439 mg/dL — ABNORMAL HIGH (ref 70–99)
Glucose, Bld: 177 mg/dL — ABNORMAL HIGH (ref 70–99)
Glucose, Bld: 145 mg/dL — ABNORMAL HIGH (ref 70–99)
Potassium: 4.8 mmol/L (ref 3.5–5.1)
Potassium: 4.8 mmol/L (ref 3.5–5.1)
Potassium: 4.8 mmol/L (ref 3.5–5.1)
Sodium: 135 mmol/L (ref 135–145)
Sodium: 140 mmol/L (ref 135–145)
Sodium: 136 mmol/L (ref 135–145)

## 2018-09-15 LAB — CBC WITH DIFFERENTIAL/PLATELET
Abs Immature Granulocytes: 0.6 10*3/uL — ABNORMAL HIGH (ref 0.00–0.07)
Basophils Absolute: 0.2 10*3/uL — ABNORMAL HIGH (ref 0.0–0.1)
Basophils Relative: 1 %
Eosinophils Absolute: 0 10*3/uL (ref 0.0–0.5)
Eosinophils Relative: 0 %
HCT: 56.8 % — ABNORMAL HIGH (ref 39.0–52.0)
Hemoglobin: 18.2 g/dL — ABNORMAL HIGH (ref 13.0–17.0)
Immature Granulocytes: 3 %
Lymphocytes Relative: 7 %
Lymphs Abs: 1.4 10*3/uL (ref 0.7–4.0)
MCH: 28.7 pg (ref 26.0–34.0)
MCHC: 32 g/dL (ref 30.0–36.0)
MCV: 89.6 fL (ref 80.0–100.0)
Monocytes Absolute: 0.5 10*3/uL (ref 0.1–1.0)
Monocytes Relative: 3 %
Neutro Abs: 16.7 10*3/uL — ABNORMAL HIGH (ref 1.7–7.7)
Neutrophils Relative %: 86 %
Platelets: 308 10*3/uL (ref 150–400)
RBC: 6.34 MIL/uL — ABNORMAL HIGH (ref 4.22–5.81)
RDW: 12.7 % (ref 11.5–15.5)
WBC: 19.4 10*3/uL — ABNORMAL HIGH (ref 4.0–10.5)
nRBC: 0 % (ref 0.0–0.2)

## 2018-09-15 LAB — GLUCOSE, CAPILLARY
GLUCOSE-CAPILLARY: 141 mg/dL — AB (ref 70–99)
GLUCOSE-CAPILLARY: 134 mg/dL — AB (ref 70–99)
Glucose-Capillary: 183 mg/dL — ABNORMAL HIGH (ref 70–99)
Glucose-Capillary: 170 mg/dL — ABNORMAL HIGH (ref 70–99)
Glucose-Capillary: 134 mg/dL — ABNORMAL HIGH (ref 70–99)

## 2018-09-15 LAB — INFLUENZA PANEL BY PCR (TYPE A & B)
Influenza A By PCR: NEGATIVE
Influenza B By PCR: NEGATIVE

## 2018-09-15 LAB — I-STAT CREATININE, ED: Creatinine, Ser: 1.1 mg/dL (ref 0.61–1.24)

## 2018-09-15 LAB — POCT I-STAT EG7
Acid-base deficit: 25 mmol/L — ABNORMAL HIGH (ref 0.0–2.0)
Bicarbonate: 6.3 mmol/L — ABNORMAL LOW (ref 20.0–28.0)
Calcium, Ion: 1.14 mmol/L — ABNORMAL LOW (ref 1.15–1.40)
HCT: 58 % — ABNORMAL HIGH (ref 39.0–52.0)
Hemoglobin: 19.7 g/dL — ABNORMAL HIGH (ref 13.0–17.0)
O2 Saturation: 55 %
Potassium: 4.9 mmol/L (ref 3.5–5.1)
Sodium: 137 mmol/L (ref 135–145)
TCO2: 7 mmol/L — ABNORMAL LOW (ref 22–32)
pCO2, Ven: 27.2 mmHg — ABNORMAL LOW (ref 44.0–60.0)
pH, Ven: 6.972 — CL (ref 7.250–7.430)
pO2, Ven: 44 mmHg (ref 32.0–45.0)

## 2018-09-15 LAB — BLOOD GAS, VENOUS
ACID-BASE DEFICIT: 11.7 mmol/L — AB (ref 0.0–2.0)
O2 SAT: 46.7 %

## 2018-09-15 LAB — MRSA PCR SCREENING: MRSA by PCR: NEGATIVE

## 2018-09-15 MED ORDER — MORPHINE SULFATE (PF) 2 MG/ML IV SOLN: 2.0000 mg | INTRAVENOUS | Status: DC | PRN

## 2018-09-15 MED ORDER — SODIUM CHLORIDE 0.9 % IV BOLUS
1000.0000 mL | Freq: Once | INTRAVENOUS | Status: AC
  Administered 2018-09-15: 1000 mL via INTRAVENOUS

## 2018-09-15 MED ORDER — DEXTROSE-NACL 5-0.45 % IV SOLN: INTRAVENOUS | Status: DC

## 2018-09-15 MED ORDER — HEPARIN SODIUM (PORCINE) 5000 UNIT/ML IJ SOLN
5000.0000 [IU] | Freq: Three times a day (TID) | INTRAMUSCULAR | Status: DC
  Administered 2018-09-15 – 2018-09-16 (×3): 5000 [IU] via SUBCUTANEOUS
  Filled 2018-09-15 (×2): qty 1

## 2018-09-15 MED ORDER — ACETAMINOPHEN 325 MG PO TABS
650.0000 mg | ORAL_TABLET | Freq: Once | ORAL | Status: AC
  Administered 2018-09-15: 650 mg via ORAL
  Filled 2018-09-15: qty 2

## 2018-09-15 MED ORDER — INSULIN REGULAR(HUMAN) IN NACL 100-0.9 UT/100ML-% IV SOLN
INTRAVENOUS | Status: DC
  Administered 2018-09-16: 4.5 [IU]/h via INTRAVENOUS
  Filled 2018-09-15: qty 100

## 2018-09-15 MED ORDER — POTASSIUM CHLORIDE 10 MEQ/100ML IV SOLN
10.0000 meq | INTRAVENOUS | Status: AC
  Administered 2018-09-15: 10 meq via INTRAVENOUS
  Filled 2018-09-15 (×2): qty 100

## 2018-09-15 MED ORDER — TRAZODONE HCL 50 MG PO TABS
100.0000 mg | ORAL_TABLET | Freq: Every evening | ORAL | Status: DC | PRN
  Administered 2018-09-15: 100 mg via ORAL
  Filled 2018-09-15: qty 2

## 2018-09-15 MED ORDER — SODIUM CHLORIDE 0.9 % BOLUS PEDS: 1000.0000 mL | Freq: Once | INTRAVENOUS | Status: DC

## 2018-09-15 MED ORDER — LORAZEPAM 2 MG/ML IJ SOLN
1.0000 mg | Freq: Once | INTRAMUSCULAR | Status: AC
  Administered 2018-09-15: 1 mg via INTRAVENOUS
  Filled 2018-09-15: qty 1

## 2018-09-15 MED ORDER — INSULIN REGULAR(HUMAN) IN NACL 100-0.9 UT/100ML-% IV SOLN
INTRAVENOUS | Status: DC
  Administered 2018-09-15: 4.3 [IU]/h via INTRAVENOUS
  Filled 2018-09-15 (×2): qty 100

## 2018-09-15 MED ORDER — SODIUM CHLORIDE 0.9 % IV SOLN: INTRAVENOUS | Status: DC

## 2018-09-15 MED ORDER — ONDANSETRON 4 MG PO TBDP
4.0000 mg | ORAL_TABLET | Freq: Once | ORAL | Status: AC
  Administered 2018-09-15: 4 mg via ORAL
  Filled 2018-09-15: qty 1

## 2018-09-15 MED ORDER — LORAZEPAM 2 MG/ML IJ SOLN
1.0000 mg | Freq: Four times a day (QID) | INTRAMUSCULAR | Status: DC | PRN
  Administered 2018-09-15: 1 mg via INTRAVENOUS
  Filled 2018-09-15: qty 1

## 2018-09-15 MED ORDER — DEXTROSE-NACL 5-0.45 % IV SOLN
INTRAVENOUS | Status: DC
  Administered 2018-09-15: 17:00:00 via INTRAVENOUS

## 2018-09-15 MED ORDER — STERILE WATER FOR INJECTION IV SOLN
INTRAVENOUS | Status: DC
  Administered 2018-09-15: 14:00:00 via INTRAVENOUS
  Filled 2018-09-15 (×3): qty 850

## 2018-09-15 MED ORDER — MORPHINE SULFATE (PF) 4 MG/ML IV SOLN
6.0000 mg | Freq: Once | INTRAVENOUS | Status: AC
  Administered 2018-09-15: 6 mg via INTRAVENOUS
  Filled 2018-09-15: qty 2

## 2018-09-15 MED ORDER — POTASSIUM CHLORIDE 10 MEQ/100ML IV SOLN
10.0000 meq | INTRAVENOUS | Status: AC
  Administered 2018-09-15 (×2): 10 meq via INTRAVENOUS
  Filled 2018-09-15 (×2): qty 100

## 2018-09-15 MED ORDER — SODIUM CHLORIDE 0.9 % IV SOLN: INTRAVENOUS | Status: AC

## 2018-09-15 NOTE — ED Provider Notes (Signed)
[facility] EMERGENCY DEPARTMENT Provider Note   CSN: [id number] Arrival date & time: 09/15/18  2130    History   Chief Complaint Chief Complaint  Patient presents with  . Cough  . Generalized Body Aches  . Hyperglycemia    HPI [name] [name]. is a 18 y.o. male.     HPI   18 year old male presents today with complaints of respiratory illness.  Patient notes 2 days ago he developed rhinorrhea nasal congestion and cough.  He notes sore throat and mouth pain.  Patient denies any close sick contacts, no recent travel or exposure to anyone known to be traveling outside of the country or high risk for coronavirus.  Patient notes that he took his insulin yesterday but has not been monitoring his blood sugar.  EMS he reported that he has been out of his insulin and has not been taking it.  Patient notes he did not receive an influenza vaccine.  Past Medical History:  Diagnosis Date  . ADD (attention deficit disorder)   . Allergy   . Anxiety   . Asthma   . Depression   . Epileptic seizure (HCC)    "both petit and grand mal; last sz ~ 2 wk ago" (06/17/2013)  . Headache(784.0)    "2-3 times/wk usually" (06/17/2013)  . Heart murmur    "heard a slight one earlier today" (06/17/2013)  . Homeless   . Migraine    "maybe once/month; it's severe" (06/17/2013)  . ODD (oppositional defiant disorder)   . Sickle cell trait (HCC)   . Type I diabetes mellitus (HCC)    "that's what they're thinking now" (06/17/2013)  . Vision abnormalities    "takes him longer to focus cause; from his sz" (06/17/2013)    Patient Active Problem List   Diagnosis Date Noted  . Adjustment disorder with mixed disturbance of emotions and conduct   . Atypical chest pain 08/19/2018  . Borderline personality disorder (HCC) 02/26/2018  . 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 Major depressive disorder, recurrent severe without psychotic  features (HCC) 07/24/2017  . DKA, type 1 (HCC) 07/16/2017  . Nausea & vomiting 07/16/2017  . Uncontrolled type 1 diabetes circulatory disorder erectile dysfunction (HCC)   . DKA (diabetic ketoacidoses) (HCC) 12/18/2016  . History of seizures 12/01/2016  . History of migraine 12/01/2016  . Disordered eating 03/08/2015  . Ketonuria   . Adjustment reaction to medical therapy   . Non compliance w medication regimen   . 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 Dehydration 08/22/2014  . Hyperglycemia due to type 1 diabetes mellitus (HCC) 08/21/2014  . Type 1 diabetes mellitus with hyperglycemia (HCC)   . Noncompliance   . Generalized abdominal pain   . Glycosuria   . Depression 07/12/2014  . Involuntary movements 06/13/2014  . Bilateral leg pain 06/13/2014  . 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 Suicidal ideations 03/31/2014  . ODD (oppositional defiant disorder) 03/03/2014  . MDD (major depressive disorder), recurrent episode, moderate (HCC) 02/16/2014  . Attention deficit hyperactivity disorder (ADHD), combined type, moderate 02/16/2014  . Microcytic anemia 02/15/2014  . Hyperglycemia 02/14/2014  . Goiter 02/04/2014  . 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 Acquired acanthosis nigricans 02/04/2014  . Obesity, morbid (HCC) 02/04/2014  . Insulin resistance 02/04/2014  . Hyperinsulinemia 02/04/2014  . Hypoglycemia associated with diabetes (HCC) 02/02/2014  .  Maladaptive health behaviors affecting medical condition 02/02/2014  . Hypoglycemia 02/02/2014  . Type 1 diabetes mellitus in patient 90 to 20 years of age with hemoglobin A1c goal of less than 7.5% (HCC) 01/26/2014  . 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  . 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 Migraine without aura 12/13/2013  . Body  mass index, pediatric, greater than or equal to 95th percentile for age 50/16/2015  . Asthma 12/13/2013  . Hypoglycemia unawareness in type 1 diabetes mellitus (HCC) 08/08/2013  . Seizure disorder (HCC) 07/06/2013  . Short-term memory loss 07/06/2013  . Sickle cell trait (HCC) 07/06/2013  . Diabetes (HCC) 06/17/2013  . Diabetes mellitus, new onset (HCC) 06/17/2013    Past Surgical History:  Procedure Laterality Date  . CIRCUMCISION  2000  . FINGER SURGERY Left 2001   "crushed pinky; had to repair it" (06/17/2013)        Home Medications    Prior to Admission medications   Medication Sig Start Date End Date Taking? Authorizing Provider  albuterol (PROVENTIL HFA;VENTOLIN HFA) 108 (90 Base) MCG/ACT inhaler Inhale 1 puff into the lungs every 4 (four) hours as needed for wheezing or shortness of breath.    [provider]  glucose blood (ACCU-CHEK GUIDE) test strip Check glucose 6x daily: For blood sugar checks 03/22/18   Long, Arlyss Repress, MD  hydrOXYzine (ATARAX/VISTARIL) 50 MG tablet Take 100 mg by mouth daily as needed for anxiety.     [provider]  insulin aspart (NOVOLOG FLEXPEN) 100 UNIT/ML FlexPen Inject 0-15 Units into the skin 3 (three) times daily with meals. In addition to 8 units given if you eat >50% of a meal 09/06/18   Tyrone Nine, MD  insulin glargine (LANTUS) 100 UNIT/ML injection Inject 0.2 mLs (20 Units total) into the skin 2 (two) times daily. 09/06/18   Tyrone Nine, MD  SUBOXONE 8-2 MG FILM Place 1 Film under the tongue 2 (two) times daily. 08/11/18   [provider]  traZODone (DESYREL) 50 MG tablet Take 100 mg by mouth at bedtime as needed for sleep.     [provider]    Family History Family History  Problem Relation Age of Onset  . Asthma Mother   . COPD Mother   . Other Mother        possible autoimmune, unclear  . Diabetes Maternal Grandmother   . Heart disease Maternal Grandmother   . Hypertension Maternal  Grandmother   . Mental illness Maternal Grandmother   . Heart disease Maternal Grandfather   . Hyperlipidemia Paternal Grandmother   . Hyperlipidemia Paternal Grandfather   . Migraines Sister        Hemiplegic Migraines     Social History Social History   Tobacco Use  . Smoking status: Passive Smoke Exposure - Never Smoker  . Smokeless tobacco: Never Used  . Tobacco comment: Mom and dad smoke outside   Substance Use Topics  . Alcohol use: No    Alcohol/week: 0.0 standard drinks  . Drug use: No     Allergies   Bee venom; Fish allergy; Shellfish allergy; and Tea   Review of Systems Review of Systems  All other systems reviewed and are negative.    Physical Exam Updated Vital Signs BP (!) 158/99   Pulse (!) 126   Temp 97.8 F (36.6 C) (Oral)   Resp (!) 36   SpO2 100%   Physical Exam Vitals signs and nursing note reviewed.  Constitutional:      Appearance: He is well-developed.  HENT:     Head: Normocephalic and atraumatic.     Comments: Dry mucous membranes-no significant oropharyngeal edema, erythema or tonsillar swelling Eyes:     General: No scleral icterus.       Right eye: No discharge.        Left eye: No discharge.     Conjunctiva/sclera: Conjunctivae normal.     Pupils: Pupils are equal, round, and reactive to light.  Neck:     Musculoskeletal: Normal range of motion.     Vascular: No JVD.     Trachea: No tracheal deviation.  Pulmonary:     Effort: Pulmonary effort is normal. No respiratory distress.     Breath sounds: Normal breath sounds. No stridor. No wheezing, rhonchi or rales.     Comments: Dry cough Chest:     Chest wall: No tenderness.  Neurological:     Mental Status: He is alert and oriented to person, place, and time.     Coordination: Coordination normal.  Psychiatric:        Behavior: Behavior normal.        Thought Content: Thought content normal.        Judgment: Judgment normal.      ED Treatments / Results  Labs (all  labs ordered are listed, but only abnormal results are displayed) Labs Reviewed  CBC WITH DIFFERENTIAL/PLATELET - Abnormal; Notable for the following components:      Result Value   WBC 19.4 (*)    RBC 6.34 (*)    Hemoglobin 18.2 (*)    HCT 56.8 (*)    Neutro Abs 16.7 (*)    Basophils Absolute 0.2 (*)    Abs Immature Granulocytes 0.60 (*)    All other components within normal limits  BASIC METABOLIC PANEL - Abnormal; Notable for the following components:   CO2 <7 (*)    Glucose, Bld 439 (*)    Creatinine, Ser 1.84 (*)    GFR calc non Af Amer 52 (*)    All other components within normal limits  CBG MONITORING, ED - Abnormal; Notable for the following components:   Glucose-Capillary 410 (*)    All other components within normal limits  CBG MONITORING, ED - Abnormal; Notable for the following components:   Glucose-Capillary 492 (*)    All other components within normal limits  POCT I-STAT EG7 - Abnormal; Notable for the following components:   pH, Ven 6.972 (*)    pCO2, Ven 27.2 (*)    Bicarbonate 6.3 (*)    TCO2 7 (*)    Acid-base deficit 25.0 (*)    Calcium, Ion 1.14 (*)    HCT 58.0 (*)    Hemoglobin 19.7 (*)    All other components within normal limits  INFLUENZA PANEL BY PCR (TYPE A & B)  I-STAT CREATININE, ED    EKG None  Radiology Dg Chest 2 View  Result Date: 09/15/2018 CLINICAL DATA:  Cough EXAM: CHEST - 2 VIEW COMPARISON:  08/29/2018 FINDINGS: The heart size and mediastinal contours are within normal limits. Both lungs are clear. The visualized skeletal structures are unremarkable. IMPRESSION: No active cardiopulmonary disease. Electronically Signed   By: Elige Ko   On: 09/15/2018 11:31    Procedures Procedures (including critical care time)  CRITICAL CARE Performed by: Kelle Darting Langston [place]   Total critical care time: 35 minutes  Critical care time was exclusive of separately billable procedures and treating other  patients.  Critical care was  necessary to treat or prevent imminent or life-threatening deterioration.  Critical care was time spent personally by me on the following activities: development of treatment plan with patient and/or surrogate as well as nursing, discussions with consultants, evaluation of patient's response to treatment, examination of patient, obtaining history from patient or surrogate, ordering and performing treatments and interventions, ordering and review of laboratory studies, ordering and review of radiographic studies, pulse oximetry and re-evaluation of patient's condition.  Medications Ordered in ED Medications  insulin regular, human (MYXREDLIN) 100 units/ 100 mL infusion (4.3 Units/hr Intravenous New Bag/Given 09/15/18 1221)  dextrose 5 %-0.45 % sodium chloride infusion (has no administration in time range)  potassium chloride 10 mEq in 100 mL IVPB (10 mEq Intravenous New Bag/Given 09/15/18 1145)  sodium bicarbonate 150 mEq in sterile water 1,000 mL infusion (has no administration in time range)  morphine 4 MG/ML injection 6 mg (has no administration in time range)  LORazepam (ATIVAN) injection 1 mg (has no administration in time range)  0.9 %  sodium chloride infusion (has no administration in time range)  0.9% NaCl bolus [name] (has no administration in time range)  morphine 2 MG/ML injection 2 mg (has no administration in time range)  ondansetron (ZOFRAN-ODT) disintegrating tablet 4 mg (4 mg Oral Given 09/15/18 1042)  acetaminophen (TYLENOL) tablet 650 mg (650 mg Oral Given 09/15/18 1041)  sodium chloride 0.9 % bolus 1,000 mL (1,000 mLs Intravenous New Bag/Given 09/15/18 1045)     Initial Impression / Assessment and Plan / ED Course  I have reviewed the triage vital signs and the nursing notes.  Pertinent labs & imaging results that were available during my care of the patient were reviewed by me and considered in my medical decision making (see chart for details).        Labs: CBC, BMP ,  influenza  Imaging: DG chest 2 view  Consults:  Therapeutics: Normal saline, insulin, sodium bicarb, Zofran  Discharge Meds:   Assessment/Plan: 18 year old male presents today with several complaints.  Patient is a very poor historian and little information is obtained from him.  Patient has what appears to be diabetic ketoacidosis presently.  Uncertain if he has been taking his insulin, uncertain if he has insulin, uncertain if he has been even checking his blood sugar.  He also has respiratory symptoms with an elevated white count, he is afebrile presently.  Patient will have screening for influenza, he has no risk factors for coronavirus at this time.  The case was discussed with [facility] physician who agreed for hospital admission.    Final Clinical Impressions(s) / ED Diagnoses   Final diagnoses:  Diabetic ketoacidosis without coma associated with type 1 diabetes mellitus [facility](HCC)    ED Discharge Orders    None       Rosalio LoudHedges, Nyshawn Gowdy, PA-C 09/15/18 1242    Mancel BaleWentz, Elliott, MD 09/15/18 1728

## 2018-09-15 NOTE — ED Notes (Signed)
Patient removed heart monitor and BP cuff. This RN attempted to put them back on patient, and patient states "Dont fucking touch me"

## 2018-09-15 NOTE — H&P (Addendum)
TR[facility] H&P   Patient Demographics:    [name] [name], is a 18 y.o. male  MRN: [id number]   DOB - [birth year]  Admit Date - 09/15/2018  Outpatient Primary MD for the patient is Patient, No Pcp Per  Referring MD/NP/PA: PA Hedges  Patient coming from: Home  Chief Complaint  Patient presents with  . Cough  . Generalized Body Aches  . Hyperglycemia      HPI:    [name] [name]  is a 18 y.o. male, with past medical history of insulin-dependent diabetes mellitus, tubal admissions in the past secondary to DKA, where he is known of leaving AGAINST MEDICAL ADVICE, patient presents to ED with complaints of generalized body ache, runny nose and cough, denies any fever, chills, chest pain, shortness of breath, no dysuria or polyuria, reports he did not take his insulin for last 5 days. -While in ED patient work-up was significant for severe DKA, with bicarb of less than 7, unable to calculate anion gap, his VBG with pH of 6.9, CO2 of 27, elevated at 1.84, chest x-ray with no acute findings, he was started on fluids, insulin drip, chest x-ray with no acute findings, influenza panel is pending, I was called to admit.   Review of systems:    In addition to the HPI above,  No Fever-chills, or generalized body ache No Headache, No changes with Vision or hearing, No problems swallowing food or Liquids, No Chest pain, reports cough and runny nose No Abdominal pain, No Nausea or Vommitting, Bowel movements are regular, No Blood in stool or Urine, No dysuria, No new skin rashes or bruises, No new joints pains-aches,  No new weakness, tingling, numbness in any extremity, No recent weight gain or loss, No polyuria, polydypsia or polyphagia, No significant Mental Stressors.  A full 10 point Review of Systems was done, except as stated above, all other Review of Systems were negative.   With Past  History of the following :    Past Medical History:  Diagnosis Date  . ADD (attention deficit disorder)   . Allergy   . Anxiety   . Asthma   . Depression   . Epileptic seizure (HCC)    "both petit and grand mal; last sz ~ 2 wk ago" (06/17/2013)  . Headache(784.0)    "2-3 times/wk usually" (06/17/2013)  . Heart murmur    "heard a slight one earlier today" (06/17/2013)  . Homeless   . Migraine    "maybe once/month; it's severe" (06/17/2013)  . ODD (oppositional defiant disorder)   . Sickle cell trait (HCC)   . Type I diabetes mellitus (HCC)    "that's what they're thinking now" (06/17/2013)  . Vision abnormalities    "takes him longer to focus cause; from his sz" (06/17/2013)      Past Surgical History:  Procedure Laterality Date  . CIRCUMCISION  2000  . FINGER SURGERY  Left 2001   "crushed pinky; had to repair it" (06/17/2013)      Social History:     Social History   Tobacco Use  . Smoking status: Passive Smoke Exposure - Never Smoker  . Smokeless tobacco: Never Used  . Tobacco comment: Mom and dad smoke outside   Substance Use Topics  . Alcohol use: No    Alcohol/week: 0.0 standard drinks      Family History :     Family History  Problem Relation Age of Onset  . Asthma Mother   . COPD Mother   . Other Mother        possible autoimmune, unclear  . Diabetes Maternal Grandmother   . Heart disease Maternal Grandmother   . Hypertension Maternal Grandmother   . Mental illness Maternal Grandmother   . Heart disease Maternal Grandfather   . Hyperlipidemia Paternal Grandmother   . Hyperlipidemia Paternal Grandfather   . Migraines Sister        Hemiplegic Migraines      Home Medications:   Prior to Admission medications   Medication Sig Start Date End Date Taking? Authorizing Provider  albuterol (PROVENTIL HFA;VENTOLIN HFA) 108 (90 Base) MCG/ACT inhaler Inhale 1 puff into the lungs every 4 (four) hours as needed for wheezing or shortness of breath.     [provider]  glucose blood (ACCU-CHEK GUIDE) test strip Check glucose 6x daily: For blood sugar checks 03/22/18   Long, Arlyss Repress, MD  hydrOXYzine (ATARAX/VISTARIL) 50 MG tablet Take 100 mg by mouth daily as needed for anxiety.     [provider]  insulin aspart (NOVOLOG FLEXPEN) 100 UNIT/ML FlexPen Inject 0-15 Units into the skin 3 (three) times daily with meals. In addition to 8 units given if you eat >50% of a meal 09/06/18   Tyrone Nine, MD  insulin glargine (LANTUS) 100 UNIT/ML injection Inject 0.2 mLs (20 Units total) into the skin 2 (two) times daily. 09/06/18   Tyrone Nine, MD  SUBOXONE 8-2 MG FILM Place 1 Film under the tongue 2 (two) times daily. 08/11/18   [provider]  traZODone (DESYREL) 50 MG tablet Take 100 mg by mouth at bedtime as needed for sleep.     [provider]     Allergies:     Allergies  Allergen Reactions  . Bee Venom Anaphylaxis  . Fish Allergy Anaphylaxis  . Shellfish Allergy Anaphylaxis  . Tea Anaphylaxis     Physical Exam:   Vitals  Blood pressure (!) 158/99, pulse (!) 126, temperature 97.8 F (36.6 C), temperature source Oral, resp. rate (!) 36, SpO2 100 %.   1. General awake alert, appears to be uncomfortable, complaining of pain everywhere  2.  Not Suicidal or Homicidal, Awake Alert, Oriented X 3.  3. No F.N deficits, ALL C.Nerves Intact, Strength 5/5 all 4 extremities, Sensation intact all 4 extremities, Plantars down going.  4. Ears and Eyes appear Normal, Conjunctivae clear, PERRLA. Moist Oral Mucosa.  5. Supple Neck, No JVD, No cervical lymphadenopathy appriciated, No Carotid Bruits.  6. Symmetrical Chest wall movement, Good air movement bilaterally, CTAB.  7.  Tachycardic , No Gallops, Rubs or Murmurs, No Parasternal Heave.  8. Positive Bowel Sounds, Abdomen Soft, No tenderness, No organomegaly appriciated,No rebound -guarding or rigidity.  9.  No Cyanosis, Normal Skin Turgor, No Skin Rash or  Bruise.  10. Good muscle tone,  joints appear normal , no effusions, Normal ROM.  Patient has right ankle bracelet  11. No Palpable Lymph Nodes in Neck or Axillae    Data Review:    CBC Recent Labs  Lab 09/15/18 0957 09/15/18 1209  WBC 19.4*  --   HGB 18.2* 19.7*  HCT 56.8* 58.0*  PLT 308  --   MCV 89.6  --   MCH 28.7  --   MCHC 32.0  --   RDW 12.7  --   LYMPHSABS 1.4  --   MONOABS 0.5  --   EOSABS 0.0  --   BASOSABS 0.2*  --    ------------------------------------------------------------------------------------------------------------------  Chemistries  Recent Labs  Lab 09/15/18 0957 09/15/18 1209  NA 135 137  K 4.8 4.9  CL 100  --   CO2 <7*  --   GLUCOSE 439*  --   BUN 17  --   CREATININE 1.84* 1.10  CALCIUM 9.1  --    ------------------------------------------------------------------------------------------------------------------ CrCl cannot be calculated (Unknown ideal weight.). ------------------------------------------------------------------------------------------------------------------ No results for input(s): TSH, T4TOTAL, T3FREE, THYROIDAB in the last 72 hours.  Invalid input(s): FREET3  Coagulation profile No results for input(s): INR, PROTIME in the last 168 hours. ------------------------------------------------------------------------------------------------------------------- No results for input(s): DDIMER in the last 72 hours. -------------------------------------------------------------------------------------------------------------------  Cardiac Enzymes No results for input(s): CKMB, TROPONINI, MYOGLOBIN in the last 168 hours.  Invalid input(s): CK ------------------------------------------------------------------------------------------------------------------ No results found for: BNP   ---------------------------------------------------------------------------------------------------------------  Urinalysis    Component  Value Date/Time   COLORURINE STRAW (A) 08/31/2018 0736   APPEARANCEUR CLEAR 08/31/2018 0736   LABSPEC 1.017 08/31/2018 0736   PHURINE 6.0 08/31/2018 0736   GLUCOSEU >=500 (A) 08/31/2018 0736   HGBUR NEGATIVE 08/31/2018 0736   BILIRUBINUR NEGATIVE 08/31/2018 0736   KETONESUR 80 (A) 08/31/2018 0736   PROTEINUR NEGATIVE 08/31/2018 0736   UROBILINOGEN 0.2 03/13/2015 0123   NITRITE NEGATIVE 08/31/2018 0736   LEUKOCYTESUR NEGATIVE 08/31/2018 0736    ----------------------------------------------------------------------------------------------------------------   Imaging Results:    EXAM: CHEST - 2 VIEW  COMPARISON:  08/29/2018  FINDINGS: The heart size and mediastinal contours are within normal limits. Both lungs are clear. The visualized skeletal structures are unremarkable.  IMPRESSION: No active cardiopulmonary disease.   Electronically Signed   By: Elige Ko   On: 09/15/2018 11:31    Assessment & Plan:    Active Problems:   DKA (diabetic ketoacidoses) (HCC)   AKI (acute kidney injury) (HCC)   URI (upper respiratory infection)    DKA -Noncompliance with his Lantus, he reports he did not take any insulin for last 5 days, will be admitted to stepdown, under DKA protocol, severe acidosis, low bicarb level, so he will be started with bicarb drip as well to run along with his DKA protocol, will repeat VBG in 4 to 5 hours to determine if should continue with bicarb drip or not, for now continue with insulin drip as well, as his anion gap closes, will transition to Lantus 20 units twice daily insulin sliding scale. -Recent A1c was last February which was 11.5  AKI -Secondary to volume depletion provoked by DKA, continue with IV fluids and avoid nephrotoxic medications  URI -Reports no complaints of respiratory illness, runny nose, congestion and cough, sore throat denies any close sick contact, recent history of travel or exposure to anyone traveling outside, or  high risk for corona, currently he is afebrile, will check influenza panel, chest x-ray with no acute findings  Opiate dependence: -Patient appears to be on Suboxone, but reports he did not use it for couple weeks  Depression:  -Not appear  to be he is on any medications  Leukocytosis -Stress related from DKA, repeat CBC tomorrow     DVT Prophylaxis Heparin SCDs  AM Labs Ordered, also please review Full Orders  Family Communication: Admission, patients condition and plan of care including tests being ordered have been discussed with the patient  who indicate understanding and agree with the plan and Code Status.  Code Status Full  Likely DC to  Home  Condition GUARDED    Consults called: None  Admission status: Observation  Time spent in minutes : 60 minutes   Huey Bienenstock M.D on 09/15/2018 at 12:58 PM  Between 7am to 7pm - Pager - [phone]. After 7pm go to [email or web address] - password [facility]  [facility] - Office  [phone]

## 2018-09-15 NOTE — ED Notes (Signed)
Patient has been uncooperative, patient continues to remove his monitor and spitting on the floor.

## 2018-09-15 NOTE — ED Notes (Signed)
ED TO INPATIENT HANDOFF REPORT  ED Nurse Name and Phone #: [phone]  S Name/Age/Gender [name] [name] 18 y.o. male Room/Bed: 036C/036C  Code Status   Code Status: Prior  Home/SNF/Other Home Patient oriented to: self, place, time and situation Is this baseline? Yes   Triage Complete: Triage complete  Chief Complaint sick  Triage Note Patient arrives via [facility] for c/o body aches, cough and sore throat x2 days. Pt also has hx of diabetes and has been out of his insulin for an unknown amount of time. Ems reports patient has had minimal contact with others since he has an ankle bracelet on. CBG 410 upon arrival.    Allergies Allergies  Allergen Reactions  . Bee Venom Anaphylaxis  . Fish Allergy Anaphylaxis  . Shellfish Allergy Anaphylaxis  . Tea Anaphylaxis    Level of Care/Admitting Diagnosis ED Disposition    ED Disposition Condition Comment   Admit  Hospital Area: [facility] [[id number]]  Level of Care: Progressive [102]  I expect the patient will be discharged within 24 hours: No (not a candidate for 5C-Observation unit)  Diagnosis: DKA (diabetic ketoacidoses) [facility]) [729021]  Admitting Physician: Chiquita Loth  Attending Physician: Randol Kern, DAWOOD S [[id number]  PT Class (Do Not Modify): Observation [104]  PT Acc Code (Do Not Modify): Observation [10022]       B Medical/Surgery History Past Medical History:  Diagnosis Date  . ADD (attention deficit disorder)   . Allergy   . Anxiety   . Asthma   . Depression   . Epileptic seizure (HCC)    "both petit and grand mal; last sz ~ 2 wk ago" (06/17/2013)  . Headache(784.0)    "2-3 times/wk usually" (06/17/2013)  . Heart murmur    "heard a slight one earlier today" (06/17/2013)  . Homeless   . Migraine    "maybe once/month; it's severe" (06/17/2013)  . ODD (oppositional defiant disorder)   . Sickle cell trait (HCC)   . Type I diabetes mellitus (HCC)    "that's what they're  thinking now" (06/17/2013)  . Vision abnormalities    "takes him longer to focus cause; from his sz" (06/17/2013)   Past Surgical History:  Procedure Laterality Date  . CIRCUMCISION  2000  . FINGER SURGERY Left 2001   "crushed pinky; had to repair it" (06/17/2013)     A IV Location/Drains/Wounds Patient Lines/Drains/Airways Status   Active Line/Drains/Airways    Name:   Placement date:   Placement time:   Site:   Days:   Peripheral IV 09/15/18 Left Antecubital   09/15/18    1006    Antecubital   less than 1   Peripheral IV 09/15/18 Antecubital   09/15/18    1157    Antecubital   less than 1   Wound / Incision (Open or Dehisced) 04/04/18 Non-pressure wound Foot Right   04/04/18    1900    Foot   164          Intake/Output Last 24 hours No intake or output data in the 24 hours ending 09/15/18 1633  Labs/Imaging Results for orders placed or performed during the hospital encounter of 09/15/18 (from the past 48 hour(s))  CBG monitoring, ED     Status: Abnormal   Collection Time: 09/15/18  9:55 AM  Result Value Ref Range   Glucose-Capillary 410 (H) 70 - 99 mg/dL  CBC with Differential     Status: Abnormal   Collection Time: 09/15/18  9:57 AM  Result Value Ref Range   WBC 19.4 (H) 4.0 - 10.5 K/uL   RBC 6.34 (H) 4.22 - 5.81 MIL/uL   Hemoglobin 18.2 (H) 13.0 - 17.0 g/dL   HCT 09.8 (H) 11.9 - 14.7 %   MCV 89.6 80.0 - 100.0 fL   MCH 28.7 26.0 - 34.0 pg   MCHC 32.0 30.0 - 36.0 g/dL   RDW 82.9 56.2 - 13.0 %   Platelets 308 150 - 400 K/uL   nRBC 0.0 0.0 - 0.2 %   Neutrophils Relative % 86 %   Neutro Abs 16.7 (H) 1.7 - 7.7 K/uL   Lymphocytes Relative 7 %   Lymphs Abs 1.4 0.7 - 4.0 K/uL   Monocytes Relative 3 %   Monocytes Absolute 0.5 0.1 - 1.0 K/uL   Eosinophils Relative 0 %   Eosinophils Absolute 0.0 0.0 - 0.5 K/uL   Basophils Relative 1 %   Basophils Absolute 0.2 (H) 0.0 - 0.1 K/uL   Immature Granulocytes 3 %   Abs Immature Granulocytes 0.60 (H) 0.00 - 0.07 K/uL     Comment: Performed at [facility], [address], [place], [place] [address]  Basic metabolic panel     Status: Abnormal   Collection Time: 09/15/18  9:57 AM  Result Value Ref Range   Sodium 135 135 - 145 mmol/L   Potassium 4.8 3.5 - 5.1 mmol/L   Chloride 100 98 - 111 mmol/L   CO2 <7 (L) 22 - 32 mmol/L   Glucose, Bld 439 (H) 70 - 99 mg/dL   BUN 17 6 - 20 mg/dL   Creatinine, Ser 4.69 (H) 0.61 - 1.24 mg/dL   Calcium 9.1 8.9 - 62.9 mg/dL   GFR calc non Af Amer 52 (L) >60 mL/min   GFR calc Af Amer >60 >60 mL/min   Anion gap NOT CALCULATED 5 - 15    Comment: Performed at [facility], [address] [place], [place] [address]  CBG monitoring, ED     Status: Abnormal   Collection Time: 09/15/18 12:02 PM  Result Value Ref Range   Glucose-Capillary 492 (H) 70 - 99 mg/dL  Influenza panel by PCR (type A & B)     Status: None   Collection Time: 09/15/18 12:03 PM  Result Value Ref Range   Influenza A By PCR NEGATIVE NEGATIVE   Influenza B By PCR NEGATIVE NEGATIVE    Comment: (NOTE) The Xpert Xpress Flu assay is intended as an aid in the diagnosis of  influenza and should not be used as a sole basis for treatment.  This  assay is FDA approved for nasopharyngeal swab specimens only. Nasal  washings and aspirates are unacceptable for Xpert Xpress Flu testing. Performed at [facility], [address] [place], [place] [address]   I-stat Creatinine, ED     Status: None   Collection Time: 09/15/18 12:09 PM  Result Value Ref Range   Creatinine, Ser 1.10 0.61 - 1.24 mg/dL  POCT I-Stat EG7     Status: Abnormal   Collection Time: 09/15/18 12:09 PM  Result Value Ref Range   pH, Ven 6.972 (LL) 7.250 - 7.430   pCO2, Ven 27.2 (L) 44.0 - 60.0 mmHg   pO2, Ven 44.0 32.0 - 45.0 mmHg   Bicarbonate 6.3 (L) 20.0 - 28.0 mmol/L   TCO2 7 (L) 22 - 32 mmol/L   O2 Saturation 55.0 %   Acid-base deficit 25.0 (H) 0.0 - 2.0  mmol/L   Sodium 137 135 - 145 mmol/L   Potassium 4.9 3.5 - 5.1  mmol/L   Calcium, Ion 1.14 (L) 1.15 - 1.40 mmol/L   HCT 58.0 (H) 39.0 - 52.0 %   Hemoglobin 19.7 (H) 13.0 - 17.0 g/dL   Patient temperature HIDE    Sample type VENOUS    Comment NOTIFIED PHYSICIAN   CBG monitoring, ED     Status: Abnormal   Collection Time: 09/15/18  1:22 PM  Result Value Ref Range   Glucose-Capillary 462 (H) 70 - 99 mg/dL  CBG monitoring, ED     Status: Abnormal   Collection Time: 09/15/18  2:40 PM  Result Value Ref Range   Glucose-Capillary 379 (H) 70 - 99 mg/dL  CBG monitoring, ED     Status: Abnormal   Collection Time: 09/15/18  3:46 PM  Result Value Ref Range   Glucose-Capillary 372 (H) 70 - 99 mg/dL   Dg Chest 2 View  Result Date: 09/15/2018 CLINICAL DATA:  Cough EXAM: CHEST - 2 VIEW COMPARISON:  08/29/2018 FINDINGS: The heart size and mediastinal contours are within normal limits. Both lungs are clear. The visualized skeletal structures are unremarkable. IMPRESSION: No active cardiopulmonary disease. Electronically Signed   By: Elige Ko   On: 09/15/2018 11:31    Pending Labs Unresulted Labs (From admission, onward)    Start     Ordered   09/16/18 0500  CBC  Tomorrow morning,   R     09/15/18 1240   Signed and Held  Basic metabolic panel  STAT Now then every 4 hours ,   STAT     Signed and Held   Signed and Held  CBC  (heparin)  Once,   R    Comments:  Baseline for heparin therapy IF NOT ALREADY DRAWN.  Notify MD if PLT < 100 K.    Signed and Held   Signed and Held  Creatinine, serum  (heparin)  Once,   R    Comments:  Baseline for heparin therapy IF NOT ALREADY DRAWN.    Signed and Held          Vitals/Pain Today's Vitals   09/15/18 1315 09/15/18 1330 09/15/18 1615 09/15/18 1630  BP: (!) 150/96 (!) 148/64 135/86 130/84  Pulse: (!) 124 (!) 136 (!) 117 (!) 119  Resp: (!) 23 20 (!) 21 20  Temp:      TempSrc:      SpO2: 99% 94% 100% 100%  PainSc:        Isolation Precautions Droplet precaution  Medications Medications  insulin  regular, human (MYXREDLIN) 100 units/ 100 mL infusion (9.4 Units/hr Intravenous Rate/Dose Change 09/15/18 1548)  dextrose 5 %-0.45 % sodium chloride infusion (has no administration in time range)  sodium bicarbonate 150 mEq in sterile water 1,000 mL infusion ( Intravenous New Bag/Given 09/15/18 1337)  0.9 %  sodium chloride infusion (has no administration in time range)  morphine 2 MG/ML injection 2 mg (has no administration in time range)  LORazepam (ATIVAN) injection 1 mg (1 mg Intravenous Given 09/15/18 1444)  ondansetron (ZOFRAN-ODT) disintegrating tablet 4 mg (4 mg Oral Given 09/15/18 1042)  acetaminophen (TYLENOL) tablet 650 mg (650 mg Oral Given 09/15/18 1041)  sodium chloride 0.9 % bolus 1,000 mL (1,000 mLs Intravenous New Bag/Given 09/15/18 1045)  potassium chloride 10 mEq in 100 mL IVPB (0 mEq Intravenous Stopped 09/15/18 1547)  morphine 4 MG/ML injection 6 mg (6 mg Intravenous Given 09/15/18 1256)  LORazepam (ATIVAN)  injection 1 mg (1 mg Intravenous Given 09/15/18 1251)  sodium chloride 0.9 % bolus 1,000 mL (1,000 mLs Intravenous New Bag/Given 09/15/18 1434)    Mobility walks Low fall risk   Focused Assessments Pulmonary Assessment Handoff:  Lung sounds: Bilateral Breath Sounds: Clear O2 Device: Nasal Cannula O2 Flow Rate (L/min): 2 L/min      R Recommendations: See Admitting Provider Note  Report given to:   Additional Notes: .

## 2018-09-15 NOTE — ED Notes (Signed)
Patient agrees to get back on BP&TELE monitor for adequate assess to get pain meds

## 2018-09-15 NOTE — ED Provider Notes (Signed)
  Face-to-face evaluation   History: He presents for evaluation of generalized pain, nausea, dizziness and weakness.  He is not taking his insulin because he "does not have a prescription."  Physical exam: Alert, uncomfortable, anxious, writhing in the bed.  He is grasping his chest and upper abdomen.  He is breathing fast.  Patient is very distracted.   Medical screening examination/treatment/procedure(s) were conducted as a shared visit with non-physician practitioner(s) and myself.  I personally evaluated the patient during the encounter    Mancel Bale, MD 09/15/18 1728

## 2018-09-15 NOTE — ED Notes (Signed)
Lab results was given to Dr. Wentz. 

## 2018-09-15 NOTE — ED Triage Notes (Signed)
Patient arrives [facility] for c/o body aches, cough and sore throat x2 days. Pt also has hx of diabetes and has been out of his insulin for an unknown amount of time. Ems reports patient has had minimal contact with others since he has an ankle bracelet on. CBG 410 upon arrival.

## 2018-09-16 DIAGNOSIS — E1010 Type 1 diabetes mellitus with ketoacidosis without coma: Secondary | ICD-10-CM | POA: Diagnosis not present

## 2018-09-16 LAB — BASIC METABOLIC PANEL
Anion gap: 13 (ref 5–15)
Anion gap: 13 (ref 5–15)
Anion gap: 9 (ref 5–15)
Anion gap: 14 (ref 5–15)
BUN: 9 mg/dL (ref 6–20)
BUN: 7 mg/dL (ref 6–20)
BUN: 6 mg/dL (ref 6–20)
BUN: 7 mg/dL (ref 6–20)
CO2: 14 mmol/L — ABNORMAL LOW (ref 22–32)
CO2: 17 mmol/L — ABNORMAL LOW (ref 22–32)
CO2: 19 mmol/L — ABNORMAL LOW (ref 22–32)
CO2: 17 mmol/L — ABNORMAL LOW (ref 22–32)
Calcium: 8.5 mg/dL — ABNORMAL LOW (ref 8.9–10.3)
Calcium: 8.6 mg/dL — ABNORMAL LOW (ref 8.9–10.3)
Calcium: 8.3 mg/dL — ABNORMAL LOW (ref 8.9–10.3)
Calcium: 8.3 mg/dL — ABNORMAL LOW (ref 8.9–10.3)
Chloride: 105 mmol/L (ref 98–111)
Chloride: 105 mmol/L (ref 98–111)
Chloride: 107 mmol/L (ref 98–111)
Chloride: 103 mmol/L (ref 98–111)
Creatinine, Ser: 1.09 mg/dL (ref 0.61–1.24)
Creatinine, Ser: 1.04 mg/dL (ref 0.61–1.24)
Creatinine, Ser: 0.92 mg/dL (ref 0.61–1.24)
Creatinine, Ser: 0.99 mg/dL (ref 0.61–1.24)
GFR calc Af Amer: >60 mL/min (ref >60)
GFR calc Af Amer: >60 mL/min (ref >60)
GFR calc Af Amer: >60 mL/min (ref >60)
GFR calc Af Amer: >60 mL/min (ref >60)
GFR calc non Af Amer: >60 mL/min (ref >60)
GFR calc non Af Amer: >60 mL/min (ref >60)
GFR calc non Af Amer: >60 mL/min (ref >60)
GFR calc non Af Amer: >60 mL/min (ref >60)
Glucose, Bld: 259 mg/dL — ABNORMAL HIGH (ref 70–99)
Glucose, Bld: 185 mg/dL — ABNORMAL HIGH (ref 70–99)
Glucose, Bld: 165 mg/dL — ABNORMAL HIGH (ref 70–99)
Glucose, Bld: 320 mg/dL — ABNORMAL HIGH (ref 70–99)
POTASSIUM: 3.8 mmol/L (ref 3.5–5.1)
Potassium: 3.3 mmol/L — ABNORMAL LOW (ref 3.5–5.1)
Potassium: 3.2 mmol/L — ABNORMAL LOW (ref 3.5–5.1)
Potassium: 3.7 mmol/L (ref 3.5–5.1)
Sodium: 132 mmol/L — ABNORMAL LOW (ref 135–145)
Sodium: 135 mmol/L (ref 135–145)
Sodium: 135 mmol/L (ref 135–145)
Sodium: 134 mmol/L — ABNORMAL LOW (ref 135–145)

## 2018-09-16 LAB — GLUCOSE, CAPILLARY
Glucose-Capillary: 121 mg/dL — ABNORMAL HIGH (ref 70–99)
Glucose-Capillary: 137 mg/dL — ABNORMAL HIGH (ref 70–99)
Glucose-Capillary: 138 mg/dL — ABNORMAL HIGH (ref 70–99)
Glucose-Capillary: 149 mg/dL — ABNORMAL HIGH (ref 70–99)
Glucose-Capillary: 151 mg/dL — ABNORMAL HIGH (ref 70–99)
Glucose-Capillary: 165 mg/dL — ABNORMAL HIGH (ref 70–99)
Glucose-Capillary: 186 mg/dL — ABNORMAL HIGH (ref 70–99)
Glucose-Capillary: 191 mg/dL — ABNORMAL HIGH (ref 70–99)
Glucose-Capillary: 197 mg/dL — ABNORMAL HIGH (ref 70–99)
Glucose-Capillary: 198 mg/dL — ABNORMAL HIGH (ref 70–99)
Glucose-Capillary: 223 mg/dL — ABNORMAL HIGH (ref 70–99)
Glucose-Capillary: 229 mg/dL — ABNORMAL HIGH (ref 70–99)
Glucose-Capillary: 264 mg/dL — ABNORMAL HIGH (ref 70–99)
Glucose-Capillary: 310 mg/dL — ABNORMAL HIGH (ref 70–99)

## 2018-09-16 LAB — CBC
HCT: 42.9 % (ref 39.0–52.0)
HEMOGLOBIN: 14.4 g/dL (ref 13.0–17.0)
MCH: 28.1 pg (ref 26.0–34.0)
MCHC: 33.6 g/dL (ref 30.0–36.0)
MCV: 83.6 fL (ref 80.0–100.0)
Platelets: 213 10*3/uL (ref 150–400)
RBC: 5.13 MIL/uL (ref 4.22–5.81)
RDW: 12.8 % (ref 11.5–15.5)
WBC: 15.8 10*3/uL — AB (ref 4.0–10.5)
nRBC: 0 % (ref 0.0–0.2)

## 2018-09-16 MED ORDER — INSULIN ASPART 100 UNIT/ML ~~LOC~~ SOLN
0.0000 [IU] | Freq: Three times a day (TID) | SUBCUTANEOUS | Status: DC
  Administered 2018-09-16: 7 [IU] via SUBCUTANEOUS

## 2018-09-16 MED ORDER — BLOOD GLUCOSE METER KIT: PACK | 0 refills | Status: DC

## 2018-09-16 MED ORDER — INSULIN GLARGINE 100 UNIT/ML SOLOSTAR PEN: 20.0000 [IU] | PEN_INJECTOR | Freq: Two times a day (BID) | SUBCUTANEOUS | 0 refills | Status: DC

## 2018-09-16 MED ORDER — INSULIN GLARGINE 100 UNIT/ML ~~LOC~~ SOLN
20.0000 [IU] | Freq: Two times a day (BID) | SUBCUTANEOUS | Status: DC
  Administered 2018-09-16: 20 [IU] via SUBCUTANEOUS
  Filled 2018-09-16 (×2): qty 0.2

## 2018-09-16 MED ORDER — INSULIN ASPART 100 UNIT/ML FLEXPEN: 0.0000 [IU] | PEN_INJECTOR | Freq: Three times a day (TID) | SUBCUTANEOUS | 0 refills | Status: DC

## 2018-09-16 MED ORDER — INSULIN PEN NEEDLE 32G X 8 MM MISC: 0 refills | Status: DC

## 2018-09-16 MED ORDER — GLUCOSE BLOOD VI STRP: ORAL_STRIP | 0 refills | Status: DC

## 2018-09-16 MED ORDER — ALBUTEROL SULFATE HFA 108 (90 BASE) MCG/ACT IN AERS: 1.0000 | INHALATION_SPRAY | RESPIRATORY_TRACT | 0 refills | Status: DC | PRN

## 2018-09-16 MED FILL — NOVOLOG FLEXPEN SYRINGE: 100 | 22 days supply | Qty: 15 | Fill #0

## 2018-09-16 MED FILL — ACCU-CHEK AVIVA PLUS STRP: 25 days supply | Qty: 100 | Fill #0

## 2018-09-16 MED FILL — PROAIR HFA 90 MCG INHALER: 108 (90 BAS | 30 days supply | Qty: 9 | Fill #0

## 2018-09-16 MED FILL — PENTIPS 31G X 8 MM MISC: 31G X 8 MM | 30 days supply | Qty: 100 | Fill #0

## 2018-09-16 MED FILL — ACCU-CHEK FASTCLIX LANCETS: 25 days supply | Qty: 102 | Fill #0

## 2018-09-16 MED FILL — LANTUS SOLOSTAR 100 UNITS/M: 100 | 30 days supply | Qty: 12 | Fill #0

## 2018-09-16 NOTE — TOC Transition Note (Addendum)
Transition of Care [facility]) - CM/SW Discharge Note   Patient Details  Name: Shota Durel. MRN: [id number] Date of Birth: August 15, 2000  Transition of Care [facility]) CM/SW Contact:  Epifanio Lesches, RN Phone Number: 09/16/2018, 3:24 PM   Clinical Narrative:    Transition to home. TOC pharmacy to  provided pt with Rx meds prior to leaving. Pt states will get Accuplus meter from [facility] pharmacy, [address] once d/c. Pt without PCP... pt to f/u with [facility], scheduled appointment noted on AVS. CSW provided pt with taxi voucher for transportation to home.   Final next level of care: Home/Self Care Barriers to Discharge: Barriers Resolved   Patient Goals and CMS Choice        Discharge Placement                       Discharge Plan and Services In-house Referral: NA              DME Arranged: N/A DME Agency: NA HH Arranged: NA HH Agency: NA   Social Determinants of Health (SDOH) Interventions     Readmission Risk Interventions No flowsheet data found.

## 2018-09-16 NOTE — TOC Initial Note (Addendum)
Transition of Care (TOC) - Initial/Assessment Note    Patient Details  Name: [name] [name]. MRN: [id number] Date of Birth: Dec 03, 2000  [facility]) CM/SW Contact:    Epifanio Lesches, RN Phone Number: 09/16/2018, 3:21 PM  Clinical Narrative:   Admitted with DKA. Hx of type I DM, seizures, anxiety, ADHD, depression.  Multi admits.States home alone. Independent with ADL's, no DME usage. Pt without PCP.           Pt's # [id number]  Expected Discharge Plan: Home/Self Care Barriers to Discharge: Barriers Resolved   Patient Goals and CMS Choice        Expected Discharge Plan and Services Expected Discharge Plan: Home/Self Care In-house Referral: NA     Living arrangements for the past 2 months: Apartment Expected Discharge Date: 09/16/18               DME Arranged: N/A DME Agency: NA HH Arranged: NA HH Agency: NA  Prior Living Arrangements/Services Living arrangements for the past 2 months: Apartment Lives with:: Self Patient language and need for interpreter reviewed:: Yes Do you feel safe going back to the place where you live?: Yes      Need for Family Participation in Patient Care: No (Comment) Care giver support system in place?: No (comment)   Criminal Activity/Legal Involvement Pertinent to Current Situation/Hospitalization: No - Comment as needed  Activities of Daily Living Home Assistive Devices/Equipment: CBG Meter, Eyeglasses ADL Screening (condition at time of admission) Patient's cognitive ability adequate to safely complete daily activities?: Yes Is the patient deaf or have difficulty hearing?: No Does the patient have difficulty seeing, even when wearing glasses/contacts?: No Does the patient have difficulty concentrating, remembering, or making decisions?: Yes Patient able to express need for assistance with ADLs?: Yes Does the patient have difficulty dressing or bathing?: No Independently performs ADLs?: Yes (appropriate for  developmental age) Does the patient have difficulty walking or climbing stairs?: No Weakness of Legs: Both Weakness of Arms/Hands: Both  Permission Sought/Granted Permission sought to share information with : Case Manager Permission granted to share information with : No              Emotional Assessment Appearance:: Appears older than stated age Attitude/Demeanor/Rapport: Engaged Affect (typically observed): Calm Orientation: : Oriented to Self, Oriented to Place, Oriented to  Time, Oriented to Situation Alcohol / Substance Use: Alcohol Use, Illicit Drugs Psych Involvement: No (comment)  Admission diagnosis:  Diabetic ketoacidosis without coma associated with type 1 diabetes mellitus (HCC) [E10.10] Patient Active Problem List   Diagnosis Date Noted  . AKI (acute kidney injury) (HCC) 09/15/2018  . URI (upper respiratory infection) 09/15/2018  . Adjustment disorder with mixed disturbance of emotions and conduct   . Atypical chest pain 08/19/2018  . Borderline personality disorder (HCC) 02/26/2018  . 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 DKA, type 1 (HCC) 07/16/2017  . Nausea & vomiting 07/16/2017  . Uncontrolled type 1 diabetes circulatory disorder erectile dysfunction (HCC)   . DKA (diabetic ketoacidoses) (HCC) 12/18/2016  . History of seizures 12/01/2016  . History of migraine 12/01/2016  . Disordered eating 03/08/2015  . Ketonuria   . Adjustment reaction to medical therapy   . Non compliance w medication regimen   . 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 Dehydration 08/22/2014  . Hyperglycemia due to type 1  diabetes mellitus (HCC) 08/21/2014  . Type 1 diabetes mellitus with hyperglycemia (HCC)   . Noncompliance   . Generalized abdominal pain   . Glycosuria   . Depression  07/12/2014  . Involuntary movements 06/13/2014  . Bilateral leg pain 06/13/2014  . 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 Suicidal ideations 03/31/2014  . ODD (oppositional defiant disorder) 03/03/2014  . MDD (major depressive disorder), recurrent episode, moderate (HCC) 02/16/2014  . Attention deficit hyperactivity disorder (ADHD), combined type, moderate 02/16/2014  . Microcytic anemia 02/15/2014  . Hyperglycemia 02/14/2014  . Goiter 02/04/2014  . 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 Acquired acanthosis nigricans 02/04/2014  . Obesity, morbid (HCC) 02/04/2014  . Insulin resistance 02/04/2014  . Hyperinsulinemia 02/04/2014  . Hypoglycemia associated with diabetes (HCC) 02/02/2014  . Maladaptive health behaviors affecting medical condition 02/02/2014  . Hypoglycemia 02/02/2014  . Type 1 diabetes mellitus in patient 57 to 20 years of age with hemoglobin A1c goal of less than 7.5% (HCC) 01/26/2014  . 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  . 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 Migraine without aura 12/13/2013  . Body mass index, pediatric, greater than or equal to 95th percentile for age 37/16/2015  . Asthma 12/13/2013  . Hypoglycemia unawareness in type 1 diabetes mellitus (HCC) 08/08/2013  . Seizure disorder (HCC) 07/06/2013  . Short-term memory loss 07/06/2013  . Sickle cell trait (HCC) 07/06/2013  . Diabetes (HCC) 06/17/2013  . Diabetes mellitus, new onset (HCC) 06/17/2013   PCP:  Patient, No Pcp Per Pharmacy:   [facility] [phone] - [place], [place] - [address] AT [facility] [address] [place] [address] Phone: [phone] Fax: [phone]     Social Determinants of Health (SDOH) Interventions    Readmission Risk Interventions No flowsheet data found.

## 2018-09-16 NOTE — Progress Notes (Signed)
Medications are at the bedside. Waiting on supplies

## 2018-09-16 NOTE — Progress Notes (Signed)
Called SW, CM about pt's prescriptions and supplies.Waiting on this time.  Clothes and taxi voucher will be provided from ED.

## 2018-09-16 NOTE — Discharge Summary (Signed)
PATIENT DETAILS Name: [name] [name]. Age: 18 y.o. Sex: male Date of Birth: January 09, 2001 MRN: [id number]. Admitting Physician: Albertine Patricia, MD RWE:[id number], No Pcp Per  Admit Date: 09/15/2018 Discharge date: 09/16/2018  Recommendations for Outpatient Follow-up:  1. Follow up with PCP in 1-2 weeks 2. Please obtain BMP/CBC in one week 3. 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  Brief Hospital Course: DKA: Resolved-clinically improved-transitioned off insulin infusion earlier this morning.  IDDM: Unfortunately noncompliant to medications and to follow-up-counseled extensively-we will resume patient's usual regimen of Lantus 20 units twice daily, along with SSI-he has been asked to keep a record of his CBG readings at home and take it to his primary care practitioners office for further optimization.  URI-like symptoms: No fever-chest x-ray with no infiltrates-influenza PCR negative.  Supportive care.  Noncompliance: Counseled extensively this morning-he has been made aware of life-threatening and life disabling complications of uncontrolled diabetes, and hypoglycemia.  He basically was asking to be discharged early this morning as soon as I walked into the room-he is signed out [place] multiple times in the past.  Case management will arrange for follow-up and would also provide assistance in getting him his insulin.  Procedures/Studies: None  Discharge Diagnoses:  Active Problems:   DKA (diabetic ketoacidoses) (HCC)   AKI (acute  kidney injury) ([place])   URI (upper respiratory infection)   Discharge Instructions:  Activity:  As tolerated  Discharge Instructions    Diet Carb Modified   Complete by:  As directed    Discharge instructions   Complete by:  As directed    Follow with Primary MD in 1 week  Check your blood sugars 3-4 times a day, keep a recording of these readings-and take it to your next appointment with your primary care practitioner  Please get a complete blood count and chemistry panel checked by your Primary MD at your next visit, and again as instructed by your Primary MD.  Get Medicines reviewed and adjusted: Please take all your medications with you for your next visit with your Primary MD  Laboratory/radiological data: Please request your Primary MD to go over all hospital tests and procedure/radiological results at the follow up, please ask your Primary MD to get all Hospital records sent to his/her office.  In some cases, they will be blood work, cultures and biopsy results pending at the time of your discharge. Please request that your primary care M.D. follows up on these results.  Also Note the following: If you experience worsening of your admission symptoms, develop shortness of breath, life threatening emergency, suicidal or homicidal thoughts you must seek medical attention immediately by calling 911 or calling your MD immediately  if symptoms less severe.  You must read complete instructions/literature along with all the possible adverse reactions/side effects for all the Medicines you take and that have been prescribed to you. Take any new Medicines after you have completely understood and accpet all the possible adverse reactions/side effects.   Do not drive when taking Pain medications or sleeping medications (Benzodaizepines)  Do not take more than prescribed Pain, Sleep and Anxiety Medications.  It is not advisable to combine anxiety,sleep and pain medications without talking  with your primary care practitioner  Special Instructions: If you have smoked or chewed Tobacco  in the last 2 yrs please stop smoking, stop any regular Alcohol  and or any Recreational drug use.  Wear Seat belts while driving.  Please note: You were cared for by a hospitalist during your hospital stay. Once you are discharged, your primary care physician will handle any further medical issues. Please note that NO REFILLS for any discharge medications will be authorized once you are discharged, as it is imperative that you return to your primary care physician (or establish a relationship with a primary care physician if you do not have one) for your post hospital discharge needs so that they can reassess your need for medications and monitor your lab values.   Increase activity slowly   Complete by:  As directed      Allergies as of 09/16/2018      Reactions   Bee Venom Anaphylaxis   Fish Allergy Anaphylaxis   Shellfish Allergy Anaphylaxis   Tea Anaphylaxis      Medication List    STOP taking these medications   hydrOXYzine 50 MG tablet Commonly known as:  ATARAX/VISTARIL   insulin glargine 100 UNIT/ML injection Commonly known as:  LANTUS Replaced by:  Insulin Glargine 100 UNIT/ML Solostar Pen   Suboxone 8-2 MG Film Generic drug:  Buprenorphine HCl-Naloxone HCl   traZODone 50 MG tablet Commonly known as:  DESYREL     TAKE these medications   albuterol 108 (90 Base) MCG/ACT inhaler Commonly known as:  PROVENTIL HFA;VENTOLIN HFA Inhale 1 puff into the lungs every 4 (four) hours as needed for wheezing or shortness of breath.   blood glucose meter kit and supplies Dispense based on patient and insurance preference. Use up to four times daily as directed. (FOR ICD-10 E10.9, E11.9).   glucose blood test strip Commonly known as:  FREESTYLE TEST STRIPS Use as instructed What changed:  additional instructions   insulin aspart 100 UNIT/ML FlexPen Commonly known as:  NovoLOG  FlexPen Inject 0-15 Units into the skin 3 (three) times daily with meals. In addition to 8 units given if you eat >50% of a meal   Insulin Glargine 100 UNIT/ML Solostar Pen Commonly known as:  LANTUS Inject 20 Units into the skin 2 (two) times daily. Replaces:  insulin glargine 100 UNIT/ML injection   Insulin Pen Needle 32G X 8 MM Misc Use as directed      Follow-up Information    [facility] up on 10/06/2018.   Why:  post hospital follow up scheduled for 10:30, Dr. Ferdinand Lango information: [place] [id number] [phone]         Allergies  Allergen Reactions   Bee Venom Anaphylaxis   Fish Allergy Anaphylaxis   Shellfish Allergy Anaphylaxis   Tea Anaphylaxis    Consultations:   None   Other Procedures/Studies: Dg Chest 1 View  Result Date: 08/29/2018 CLINICAL DATA:  DKA EXAM: CHEST  1 VIEW COMPARISON:  08/18/2018 FINDINGS: The heart size and mediastinal contours are within normal limits. Both lungs are clear. The visualized skeletal structures are unremarkable. IMPRESSION: No active disease. Electronically Signed   By: Kathreen Devoid   On: 08/29/2018 13:16   Dg Chest 2 View  Result Date: 09/15/2018 CLINICAL DATA:  Cough EXAM: CHEST - 2 VIEW COMPARISON:  08/29/2018 FINDINGS: The heart size  and mediastinal contours are within normal limits. Both lungs are clear. The visualized skeletal structures are unremarkable. IMPRESSION: No active cardiopulmonary disease. Electronically Signed   By: Kathreen Devoid   On: 09/15/2018 11:31   Dg Chest 2 View  Result Date: 08/18/2018 CLINICAL DATA:  Mid chest pain for over 1 week. EXAM: CHEST - 2 VIEW COMPARISON:  08/09/2018 FINDINGS: The heart size and mediastinal contours are within normal limits. Both lungs are clear. The visualized skeletal structures are unremarkable. IMPRESSION: No active cardiopulmonary disease. Electronically Signed   By: Ashley Royalty M.D.    On: 08/18/2018 01:35   Ct Abdomen Pelvis W Contrast  Result Date: 08/29/2018 CLINICAL DATA:  Right-sided abdominal pain and fever for 2 days. Suspected appendicitis. EXAM: CT ABDOMEN AND PELVIS WITH CONTRAST TECHNIQUE: Multidetector CT imaging of the abdomen and pelvis was performed using the standard protocol following bolus administration of intravenous contrast. CONTRAST:  110m ISOVUE-300 IOPAMIDOL (ISOVUE-300) INJECTION 61% COMPARISON:  07/16/2017 FINDINGS: Lower Chest: No acute findings. Hepatobiliary: No hepatic masses identified. Gallbladder is unremarkable. Pancreas:  No mass or inflammatory changes. Spleen: Within normal limits in size and appearance. Adrenals/Urinary Tract: No masses identified. No evidence of hydronephrosis. Mild diffuse bladder wall thickening is noted, suspicious for cystitis. Stomach/Bowel: No evidence of obstruction, inflammatory process or abnormal fluid collections. Normal appendix visualized. Vascular/Lymphatic: No pathologically enlarged lymph nodes. No abdominal aortic aneurysm. Reproductive:  No mass or other significant abnormality. Other:  None. Musculoskeletal:  No suspicious bone lesions identified. IMPRESSION: 1. Mild diffuse bladder wall thickening, suspicious for cystitis. Suggest correlation with urinalysis. 2. No evidence of appendicitis or other acute findings. Electronically Signed   By: JEarle GellM.D.   On: 08/29/2018 14:54   [place] Site Rite  Result Date: 08/31/2018 If Site Rite image not attached, placement could not be confirmed due to current cardiac rhythm.     TODAY-DAY OF DISCHARGE:  Subjective:   HWhitley Strycharztoday has no headache,no chest abdominal pain,no new weakness tingling or numbness, feels much better wants to go home today.   Objective:   Blood pressure (!) 113/59, pulse 100, temperature 99.5 F (37.5 C), temperature source Oral, resp. rate 18, height '5\' 10"'  (1.778 m), weight 83.2 kg, SpO2 100 %.  Intake/Output Summary  (Last 24 hours) at 09/16/2018 1123 Last data filed at 09/16/2018 0907 Gross per 24 hour  Intake 0 ml  Output --  Net 0 ml   Filed Weights   09/15/18 1845  Weight: 83.2 kg    Exam: Awake Alert, Oriented *3, No new F.N deficits, Normal affect [place].AT,PERRAL Supple Neck,No JVD, No cervical lymphadenopathy appriciated.  Symmetrical Chest wall movement, Good air movement bilaterally, CTAB RRR,No Gallops,Rubs or new Murmurs, No Parasternal Heave +ve B.Sounds, Abd Soft, Non tender, No organomegaly appriciated, No rebound -guarding or rigidity. No Cyanosis, Clubbing or edema, No new Rash or bruise   PERTINENT RADIOLOGIC STUDIES: Dg Chest 1 View  Result Date: 08/29/2018 CLINICAL DATA:  DKA EXAM: CHEST  1 VIEW COMPARISON:  08/18/2018 FINDINGS: The heart size and mediastinal contours are within normal limits. Both lungs are clear. The visualized skeletal structures are unremarkable. IMPRESSION: No active disease. Electronically Signed   By: HKathreen Devoid  On: 08/29/2018 13:16   Dg Chest 2 View  Result Date: 09/15/2018 CLINICAL DATA:  Cough EXAM: CHEST - 2 VIEW COMPARISON:  08/29/2018 FINDINGS: The heart size and mediastinal contours are within normal limits. Both lungs are clear. The visualized skeletal structures are unremarkable. IMPRESSION:  No active cardiopulmonary disease. Electronically Signed   By: Kathreen Devoid   On: 09/15/2018 11:31   Dg Chest 2 View  Result Date: 08/18/2018 CLINICAL DATA:  Mid chest pain for over 1 week. EXAM: CHEST - 2 VIEW COMPARISON:  08/09/2018 FINDINGS: The heart size and mediastinal contours are within normal limits. Both lungs are clear. The visualized skeletal structures are unremarkable. IMPRESSION: No active cardiopulmonary disease. Electronically Signed   By: Ashley Royalty M.D.   On: 08/18/2018 01:35   Ct Abdomen Pelvis W Contrast  Result Date: 08/29/2018 CLINICAL DATA:  Right-sided abdominal pain and fever for 2 days. Suspected appendicitis. EXAM: CT ABDOMEN  AND PELVIS WITH CONTRAST TECHNIQUE: Multidetector CT imaging of the abdomen and pelvis was performed using the standard protocol following bolus administration of intravenous contrast. CONTRAST:  146m ISOVUE-300 IOPAMIDOL (ISOVUE-300) INJECTION 61% COMPARISON:  07/16/2017 FINDINGS: Lower Chest: No acute findings. Hepatobiliary: No hepatic masses identified. Gallbladder is unremarkable. Pancreas:  No mass or inflammatory changes. Spleen: Within normal limits in size and appearance. Adrenals/Urinary Tract: No masses identified. No evidence of hydronephrosis. Mild diffuse bladder wall thickening is noted, suspicious for cystitis. Stomach/Bowel: No evidence of obstruction, inflammatory process or abnormal fluid collections. Normal appendix visualized. Vascular/Lymphatic: No pathologically enlarged lymph nodes. No abdominal aortic aneurysm. Reproductive:  No mass or other significant abnormality. Other:  None. Musculoskeletal:  No suspicious bone lesions identified. IMPRESSION: 1. Mild diffuse bladder wall thickening, suspicious for cystitis. Suggest correlation with urinalysis. 2. No evidence of appendicitis or other acute findings. Electronically Signed   By: JEarle GellM.D.   On: 08/29/2018 14:54   [place]kg Site Rite  Result Date: 08/31/2018 If Site Rite image not attached, placement could not be confirmed due to current cardiac rhythm.    PERTINENT LAB RESULTS: CBC: Recent Labs    09/15/18 0957 09/15/18 1209 09/16/18 0220  WBC 19.4*  --  15.8*  HGB 18.2* 19.7* 14.4  HCT 56.8* 58.0* 42.9  PLT 308  --  213   CMET CMP     Component Value Date/Time   NA 135 09/16/2018 1008   K 3.2 (L) 09/16/2018 1008   CL 107 09/16/2018 1008   CO2 19 (L) 09/16/2018 1008   GLUCOSE 165 (H) 09/16/2018 1008   BUN 6 09/16/2018 1008   CREATININE 0.92 09/16/2018 1008   CREATININE 0.87 03/31/2016 0001   CALCIUM 8.3 (L) 09/16/2018 1008   PROT 6.8 08/31/2018 0134   ALBUMIN 3.7 08/31/2018 0134   AST 16 08/31/2018  0134   ALT 21 08/31/2018 0134   ALKPHOS 92 08/31/2018 0134   BILITOT 2.2 (H) 08/31/2018 0134   GFRNONAA >60 09/16/2018 1008   GFRAA >60 09/16/2018 1008    GFR Estimated Creatinine Clearance: 133.3 mL/min (by C-G formula based on SCr of 0.92 mg/dL). No results for input(s): LIPASE, AMYLASE in the last 72 hours. No results for input(s): CKTOTAL, CKMB, CKMBINDEX, TROPONINI in the last 72 hours. Invalid input(s): POCBNP No results for input(s): DDIMER in the last 72 hours. No results for input(s): HGBA1C in the last 72 hours. No results for input(s): CHOL, HDL, LDLCALC, TRIG, CHOLHDL, LDLDIRECT in the last 72 hours. No results for input(s): TSH, T4TOTAL, T3FREE, THYROIDAB in the last 72 hours.  Invalid input(s): FREET3 No results for input(s): VITAMINB12, FOLATE, FERRITIN, TIBC, IRON, RETICCTPCT in the last 72 hours. Coags: No results for input(s): INR in the last 72 hours.  Invalid input(s): PT Microbiology: Recent Results (from the past 240 hour(s))  MRSA  PCR Screening     Status: None   Collection Time: 09/15/18  6:42 PM  Result Value Ref Range Status   MRSA by PCR NEGATIVE NEGATIVE Final    Comment:        The GeneXpert MRSA Assay (FDA approved for NASAL specimens only), is one component of a comprehensive MRSA colonization surveillance program. It is not intended to diagnose MRSA infection nor to guide or monitor treatment for MRSA infections. Performed at [facility], [place] [address], [place], [place] [address]     FURTHER DISCHARGE INSTRUCTIONS:  Get Medicines reviewed and adjusted: Please take all your medications with you for your next visit with your Primary MD  Laboratory/radiological data: Please request your Primary MD to go over all hospital tests and procedure/radiological results at the follow up, please ask your Primary MD to get all Hospital records sent to his/her office.  In some cases, they will be blood work, cultures and biopsy results  pending at the time of your discharge. Please request that your primary care M.D. goes through all the records of your hospital data and follows up on these results.  Also Note the following: If you experience worsening of your admission symptoms, develop shortness of breath, life threatening emergency, suicidal or homicidal thoughts you must seek medical attention immediately by calling 911 or calling your MD immediately  if symptoms less severe.  You must read complete instructions/literature along with all the possible adverse reactions/side effects for all the Medicines you take and that have been prescribed to you. Take any new Medicines after you have completely understood and accpet all the possible adverse reactions/side effects.   Do not drive when taking Pain medications or sleeping medications (Benzodaizepines)  Do not take more than prescribed Pain, Sleep and Anxiety Medications. It is not advisable to combine anxiety,sleep and pain medications without talking with your primary care practitioner  Special Instructions: If you have smoked or chewed Tobacco  in the last 2 yrs please stop smoking, stop any regular Alcohol  and or any Recreational drug use.  Wear Seat belts while driving.  Please note: You were cared for by a hospitalist during your hospital stay. Once you are discharged, your primary care physician will handle any further medical issues. Please note that NO REFILLS for any discharge medications will be authorized once you are discharged, as it is imperative that you return to your primary care physician (or establish a relationship with a primary care physician if you do not have one) for your post hospital discharge needs so that they can reassess your need for medications and monitor your lab values.  Total Time spent coordinating discharge including counseling, education and face to face time equals 35 minutes.  SignedOren Binet 09/16/2018 11:23 AM

## 2018-09-16 NOTE — Progress Notes (Signed)
Patient requested assistance with transportation. CSW provided taxi voucher as patient unable to take the bus.  Osborne Casco Allice Garro LCSW [phone]

## 2018-09-16 NOTE — Progress Notes (Signed)
Pt refused to continue drip after Lantus.Stopped earlier. Education given.

## 2018-09-16 NOTE — Progress Notes (Signed)
Pt transferred out from the unit. Pt belonging with the pt.  Taxi voucher given.  Instruction about importance of DM compliance given.

## 2018-09-16 NOTE — Progress Notes (Signed)
Pt requesting prescription for Subaxone and Trazadone, and taxi voucher to go home with. MD called and notified. No such prescriptions will be given to this pt at this time. SW working on Conservation officer, nature.

## 2018-09-17 ENCOUNTER — Emergency Department (HOSPITAL_COMMUNITY)
Admission: EM | Admit: 2018-09-17 | Discharge: 2018-09-19 | Disposition: A | Discharge status: Home / Self Care | Payer: Medicaid Other | Attending: Emergency Medicine | Admitting: Emergency Medicine

## 2018-09-17 ENCOUNTER — Ambulatory Visit (HOSPITAL_COMMUNITY)
Admission: AD | Admit: 2018-09-17 | Discharge: 2018-09-17 | Disposition: A | Discharge status: Home / Self Care | Payer: Medicaid Other | Attending: Psychiatry | Admitting: Psychiatry

## 2018-09-17 DIAGNOSIS — E109 Type 1 diabetes mellitus without complications: Secondary | ICD-10-CM | POA: Insufficient documentation

## 2018-09-17 DIAGNOSIS — Z91013 Allergy to seafood: Secondary | ICD-10-CM | POA: Diagnosis not present

## 2018-09-17 DIAGNOSIS — Z91018 Allergy to other foods: Secondary | ICD-10-CM | POA: Diagnosis not present

## 2018-09-17 DIAGNOSIS — Z818 Family history of other mental and behavioral disorders: Secondary | ICD-10-CM | POA: Diagnosis not present

## 2018-09-17 DIAGNOSIS — F419 Anxiety disorder, unspecified: Secondary | ICD-10-CM | POA: Insufficient documentation

## 2018-09-17 DIAGNOSIS — F331 Major depressive disorder, recurrent, moderate: Secondary | ICD-10-CM | POA: Diagnosis present

## 2018-09-17 DIAGNOSIS — F259 Schizoaffective disorder, unspecified: Secondary | ICD-10-CM | POA: Insufficient documentation

## 2018-09-17 DIAGNOSIS — F1590 Other stimulant use, unspecified, uncomplicated: Secondary | ICD-10-CM | POA: Insufficient documentation

## 2018-09-17 DIAGNOSIS — R45851 Suicidal ideations: Secondary | ICD-10-CM | POA: Diagnosis not present

## 2018-09-17 DIAGNOSIS — F23 Brief psychotic disorder: Secondary | ICD-10-CM | POA: Insufficient documentation

## 2018-09-17 DIAGNOSIS — J45909 Unspecified asthma, uncomplicated: Secondary | ICD-10-CM | POA: Diagnosis not present

## 2018-09-17 DIAGNOSIS — F12929 Cannabis use, unspecified with intoxication, unspecified: Secondary | ICD-10-CM | POA: Diagnosis not present

## 2018-09-17 DIAGNOSIS — F329 Major depressive disorder, single episode, unspecified: Secondary | ICD-10-CM | POA: Insufficient documentation

## 2018-09-17 DIAGNOSIS — Z825 Family history of asthma and other chronic lower respiratory diseases: Secondary | ICD-10-CM | POA: Insufficient documentation

## 2018-09-17 DIAGNOSIS — Z91030 Bee allergy status: Secondary | ICD-10-CM | POA: Diagnosis not present

## 2018-09-17 DIAGNOSIS — Z7722 Contact with and (suspected) exposure to environmental tobacco smoke (acute) (chronic): Secondary | ICD-10-CM | POA: Insufficient documentation

## 2018-09-17 DIAGNOSIS — Z833 Family history of diabetes mellitus: Secondary | ICD-10-CM | POA: Diagnosis not present

## 2018-09-17 DIAGNOSIS — E119 Type 2 diabetes mellitus without complications: Secondary | ICD-10-CM | POA: Diagnosis not present

## 2018-09-17 LAB — CBG MONITORING, ED: GLUCOSE-CAPILLARY: 265 mg/dL — AB (ref 70–99)
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
## 2018-09-17 NOTE — Progress Notes (Signed)
Per [name], NP pt meets criteria for inpt treatment. TTS to seek placement. [facility] does not have an appropriate bed. Pt to be transferred to [facility] for medical clearance and to await placement. TTS contacted charge nurse Terri, RN to advise.   Princess Bruins, MSW, LCSW Therapeutic Triage Specialist  [phone]

## 2018-09-18 ENCOUNTER — Encounter (HOSPITAL_COMMUNITY): Payer: Self-pay | Admitting: Emergency Medicine

## 2018-09-18 ENCOUNTER — Other Ambulatory Visit: Payer: Self-pay

## 2018-09-18 DIAGNOSIS — F331 Major depressive disorder, recurrent, moderate: Secondary | ICD-10-CM

## 2018-09-18 LAB — COMPREHENSIVE METABOLIC PANEL
ALBUMIN: 3.7 g/dL (ref 3.5–5.0)
ALT: 33 U/L (ref 0–44)
ANION GAP: 9 (ref 5–15)
AST: 37 U/L (ref 15–41)
Alkaline Phosphatase: 108 U/L (ref 38–126)
BUN: 8 mg/dL (ref 6–20)
CO2: 27 mmol/L (ref 22–32)
Calcium: 9 mg/dL (ref 8.9–10.3)
Chloride: 103 mmol/L (ref 98–111)
Creatinine, Ser: 0.75 mg/dL (ref 0.61–1.24)
GFR calc Af Amer: >60 mL/min (ref >60)
GFR calc non Af Amer: >60 mL/min (ref >60)
Glucose, Bld: 162 mg/dL — ABNORMAL HIGH (ref 70–99)
Potassium: 3.2 mmol/L — ABNORMAL LOW (ref 3.5–5.1)
Sodium: 139 mmol/L (ref 135–145)
Total Bilirubin: 0.7 mg/dL (ref 0.3–1.2)
Total Protein: 6.6 g/dL (ref 6.5–8.1)

## 2018-09-18 LAB — SALICYLATE LEVEL: Salicylate Lvl: <7 mg/dL (ref 2.8–30.0)

## 2018-09-18 LAB — RAPID URINE DRUG SCREEN, HOSP PERFORMED
Amphetamines: NOT DETECTED
Barbiturates: NOT DETECTED
Benzodiazepines: NOT DETECTED
Cocaine: NOT DETECTED
OPIATES: NOT DETECTED
TETRAHYDROCANNABINOL: NOT DETECTED

## 2018-09-18 LAB — CBG MONITORING, ED
GLUCOSE-CAPILLARY: 340 mg/dL — AB (ref 70–99)
Glucose-Capillary: 144 mg/dL — ABNORMAL HIGH (ref 70–99)
Glucose-Capillary: 279 mg/dL — ABNORMAL HIGH (ref 70–99)
Glucose-Capillary: 440 mg/dL — ABNORMAL HIGH (ref 70–99)
Glucose-Capillary: 165 mg/dL — ABNORMAL HIGH (ref 70–99)
Glucose-Capillary: 217 mg/dL — ABNORMAL HIGH (ref 70–99)

## 2018-09-18 LAB — CBC
HCT: 40.1 % (ref 39.0–52.0)
Hemoglobin: 13.6 g/dL (ref 13.0–17.0)
MCH: 29 pg (ref 26.0–34.0)
MCHC: 33.9 g/dL (ref 30.0–36.0)
MCV: 85.5 fL (ref 80.0–100.0)
Platelets: 180 10*3/uL (ref 150–400)
RBC: 4.69 MIL/uL (ref 4.22–5.81)
RDW: 12.4 % (ref 11.5–15.5)
WBC: 7.1 10*3/uL (ref 4.0–10.5)
nRBC: 0 % (ref 0.0–0.2)

## 2018-09-18 LAB — ETHANOL: Alcohol, Ethyl (B): <10 mg/dL (ref <10)

## 2018-09-18 LAB — ACETAMINOPHEN LEVEL: Acetaminophen (Tylenol), Serum: <10 ug/mL — ABNORMAL LOW (ref 10–30)

## 2018-09-18 MED ORDER — GUAIFENESIN-DM 100-10 MG/5ML PO SYRP
5.0000 mL | ORAL_SOLUTION | ORAL | Status: DC | PRN
  Filled 2018-09-18: qty 5

## 2018-09-18 MED ORDER — INSULIN GLARGINE 100 UNIT/ML ~~LOC~~ SOLN
20.0000 [IU] | Freq: Two times a day (BID) | SUBCUTANEOUS | Status: DC
  Administered 2018-09-18 – 2018-09-19 (×4): 20 [IU] via SUBCUTANEOUS
  Filled 2018-09-18 (×4): qty 0.2

## 2018-09-18 MED ORDER — ONDANSETRON HCL 4 MG PO TABS: 4.0000 mg | ORAL_TABLET | Freq: Three times a day (TID) | ORAL | Status: DC | PRN

## 2018-09-18 MED ORDER — ALUM & MAG HYDROXIDE-SIMETH 200-200-20 MG/5ML PO SUSP: 30.0000 mL | Freq: Four times a day (QID) | ORAL | Status: DC | PRN

## 2018-09-18 MED ORDER — POTASSIUM CHLORIDE CRYS ER 20 MEQ PO TBCR
40.0000 meq | EXTENDED_RELEASE_TABLET | Freq: Every day | ORAL | Status: DC
  Administered 2018-09-18 – 2018-09-19 (×2): 40 meq via ORAL
  Filled 2018-09-18 (×3): qty 2

## 2018-09-18 MED ORDER — FLUOXETINE HCL 20 MG PO CAPS
20.0000 mg | ORAL_CAPSULE | Freq: Every day | ORAL | Status: DC
  Administered 2018-09-18 – 2018-09-19 (×2): 20 mg via ORAL
  Filled 2018-09-18 (×2): qty 1

## 2018-09-18 MED ORDER — INSULIN ASPART PROT & ASPART (70-30 MIX) 100 UNIT/ML ~~LOC~~ SUSP: 15.0000 [IU] | Freq: Once | SUBCUTANEOUS | Status: DC

## 2018-09-18 MED ORDER — TRAZODONE HCL 100 MG PO TABS
100.0000 mg | ORAL_TABLET | Freq: Every evening | ORAL | Status: DC | PRN
  Administered 2018-09-18: 100 mg via ORAL
  Filled 2018-09-18: qty 1

## 2018-09-18 MED ORDER — LORAZEPAM 1 MG PO TABS
1.0000 mg | ORAL_TABLET | Freq: Every evening | ORAL | Status: DC | PRN
  Administered 2018-09-18: 1 mg via ORAL
  Filled 2018-09-18: qty 1

## 2018-09-18 MED ORDER — INSULIN ASPART 100 UNIT/ML ~~LOC~~ SOLN
0.0000 [IU] | Freq: Three times a day (TID) | SUBCUTANEOUS | Status: DC
  Administered 2018-09-18: 8 [IU] via SUBCUTANEOUS
  Administered 2018-09-18: 3 [IU] via SUBCUTANEOUS
  Administered 2018-09-19: 8 [IU] via SUBCUTANEOUS
  Filled 2018-09-18 (×4): qty 1

## 2018-09-18 MED ORDER — ALBUTEROL SULFATE HFA 108 (90 BASE) MCG/ACT IN AERS: 1.0000 | INHALATION_SPRAY | RESPIRATORY_TRACT | Status: DC | PRN

## 2018-09-18 MED ORDER — INSULIN ASPART 100 UNIT/ML ~~LOC~~ SOLN: 0.0000 [IU] | Freq: Three times a day (TID) | SUBCUTANEOUS | Status: DC

## 2018-09-18 MED ORDER — INSULIN ASPART 100 UNIT/ML ~~LOC~~ SOLN
15.0000 [IU] | Freq: Once | SUBCUTANEOUS | Status: AC
  Administered 2018-09-18: 15 [IU] via SUBCUTANEOUS

## 2018-09-18 NOTE — ED Notes (Signed)
Patient refused vitals  Rn Latricia made aware 

## 2018-09-18 NOTE — ED Notes (Signed)
Pt stays in bed, sleepy. Cooperative with care, but does is lethargic today.
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
## 2018-09-18 NOTE — H&P (Signed)
Behavioral Health Medical Screening Exam  [name] [name]. is an 18 y.o. male. Patient will not speak to this Clinical research associate. Nodded head yes or no for some questions and ROS.  Total Time spent with patient: 20 minutes  Psychiatric Specialty Exam: Physical Exam  Constitutional: He appears well-developed and well-nourished. No distress.  HENT:  Head: Normocephalic and atraumatic.  Right Ear: External ear normal.  Left Ear: External ear normal.  Eyes: Pupils are equal, round, and reactive to light.  Respiratory: Effort normal. No respiratory distress.  Musculoskeletal: Normal range of motion.  Neurological: He is alert.  Skin: He is not diaphoretic.  Psychiatric: He exhibits a depressed mood.    Review of Systems  Constitutional: Negative for chills, diaphoresis, fever, malaise/fatigue and weight loss.  Respiratory: Negative for cough and shortness of breath.   Cardiovascular: Negative for chest pain.  Gastrointestinal: Negative for diarrhea, nausea and vomiting.  Psychiatric/Behavioral: Positive for depression, substance abuse and suicidal ideas. Negative for hallucinations and memory loss. The patient is nervous/anxious and has insomnia.   All other systems reviewed and are negative.   Blood pressure (!) 153/80, pulse (!) 118, temperature 98.3 F (36.8 C), resp. rate 18, SpO2 100 %.There is no height or weight on file to calculate BMI.  General Appearance: Casual and Fairly Groomed  Eye Contact:  Fair  Speech:  Would only nod yes or no for this Clinical research associate  Volume:  UTA  Mood:  Depressed, Hopeless, Irritable, Worthless and Based on intake sheet and TTS assessment  Affect:  Congruent and Depressed  Thought Process:  Coherent  Orientation:  Other:  UTA  Thought Content:  UTA patient will not talk to this Clinical research associate  Suicidal Thoughts:  Yes, Based on intake sheet and TTS assessment  Homicidal Thoughts:   Memory:  UTA  Judgement:  UTA  Insight:  UTA  Psychomotor Activity:  Normal   Concentration:   Recall:    Fund of Knowledge:  Language:   Akathisia:   Handed:   AIMS (if indicated):     Assets:    Sleep:       Musculoskeletal: Strength & Muscle Tone: within normal limits Gait & Station: normal   Blood pressure (!) 153/80, pulse (!) 118, temperature 98.3 F (36.8 C), resp. rate 18, SpO2 100 %.  Recommendations:  Based on my evaluation the patient does not appear to have an emergency medical condition.  Based on patient's medical history and noncompliance he will need medical clearance.   Jackelyn Poling, NP 09/18/2018, 1:19 AM

## 2018-09-18 NOTE — ED Notes (Signed)
Pt A&O x 3, sleeping at present.  Monitoring for safety, Q 15 min checks in effect, calm & cooperative, no distress noted.

## 2018-09-18 NOTE — ED Notes (Signed)
Pt in bed, alert and moving around.

## 2018-09-18 NOTE — Progress Notes (Signed)
Psychiatrist requested that CSW contact patient's ACT Team ([facility]) to obtain collateral information.  CSW contacted ([facility]) and spoke with staff member Lowella Bandy, staff reported that patient has had engagement issues with ACT Team and sees a therapist inconsistently. Staff reported that patient is currently prescribed hydroxyzine and trazodone and is not medication complaint. Staff reported that patient is a fragile diabetic and is not complaint with those medications. Staff reported that patient does not currently have stable housing due to eviction and has missed several housing appointments due to being unavailable. Staff reported that patient has a history of self harm and anti social behaviors. CSW thanked staff for collateral information provided.   CSW updated psychiatrist and DNP with collateral information obtained. Psychiatrist recommended 24 hour observation for patient.   Celso Sickle, LCSW Clinical Social Worker [facility]  Cell#: [phone]

## 2018-09-18 NOTE — Consult Note (Addendum)
[place] Psychiatry Consult   Reason for Consult:  Suicidal ideatoins Referring Physician:  EDP Patient Identification: [name] [name]. MRN:  [id number] Principal Diagnosis: MDD (major depressive disorder), recurrent episode, moderate ([place]) Diagnosis:  Principal Problem:   MDD (major depressive disorder), recurrent episode, moderate ([place])   Total Time spent with patient: 45 minutes  Subjective:   [name] [name]. is a 18 y.o. male patient admitted with suicidal ideations.  HPI:  18 yo male who presented to the ED with suicidal ideations after an argument.  Today on assessment, he denies suicidal/homicidal ideations, hallucinations, and substance abuse.  He is impulsive and his ACT team, [facility], was notified who reported he has not been taking his hydroxyzine for anxiety, did not show up for his housing appointment because he was "busy", and did walk in traffic the other day.  His current stressor is being homeless which is increasing his depression, 4/10 depression, no suicidal ideations today.  Medications started for depression and re-evaluate in the am for continuation of stabilization.  Past Psychiatric History: depressive  Risk to Self:  none Risk to Others:  none Prior Inpatient Therapy:  [facility] Prior Outpatient Therapy:  [facility]  Past Medical History:  Past Medical History:  Diagnosis Date  . ADD (attention deficit disorder)   . Allergy   . Anxiety   . Asthma   . Depression   . Epileptic seizure ([place])    "both petit and grand mal; last sz ~ 2 wk ago" (06/17/2013)  . Headache(784.0)    "2-3 times/wk usually" (06/17/2013)  . Heart murmur    "heard a slight one earlier today" (06/17/2013)  . Homeless   . Migraine    "maybe once/month; it's severe" (06/17/2013)  . ODD (oppositional defiant disorder)   . Sickle cell trait ([place])   . Type I diabetes mellitus ([place])    "that's what they're thinking now" (06/17/2013)  . Vision abnormalities    "takes him longer to  focus cause; from his sz" (06/17/2013)    Past Surgical History:  Procedure Laterality Date  . CIRCUMCISION  2000  . FINGER SURGERY Left 2001   "crushed pinky; had to repair it" (06/17/2013)   Family History:  Family History  Problem Relation Age of Onset  . Asthma Mother   . COPD Mother   . Other Mother        possible autoimmune, unclear  . Diabetes Maternal Grandmother   . Heart disease Maternal Grandmother   . Hypertension Maternal Grandmother   . Mental illness Maternal Grandmother   . Heart disease Maternal Grandfather   . Hyperlipidemia Paternal Grandmother   . Hyperlipidemia Paternal Grandfather   . Migraines Sister        Hemiplegic Migraines    Family Psychiatric  History: see above Social History:  Social History   Substance and Sexual Activity  Alcohol Use No  . Alcohol/week: 0.0 standard drinks     Social History   Substance and Sexual Activity  Drug Use No    Social History   Socioeconomic History  . Marital status: Single    Spouse name: Not on file  . Number of children: Not on file  . Years of education: Not on file  . Highest education level: Not on file  Occupational History  . Not on file  Social Needs  . Financial resource strain: Not on file  . Food insecurity:    Worry: Not on file    Inability: Not on file  .  Transportation needs:    Medical: Not on file    Non-medical: Not on file  Tobacco Use  . Smoking status: Passive Smoke Exposure - Never Smoker  . Smokeless tobacco: Never Used  . Tobacco comment: Mom and dad smoke outside   Substance and Sexual Activity  . Alcohol use: No    Alcohol/week: 0.0 standard drinks  . Drug use: No  . Sexual activity: Never  Lifestyle  . Physical activity:    Days per week: Not on file    Minutes per session: Not on file  . Stress: Not on file  Relationships  . Social connections:    Talks on phone: Not on file    Gets together: Not on file    Attends religious service: Not on file     Active member of club or organization: Not on file    Attends meetings of clubs or organizations: Not on file    Relationship status: Not on file  Other Topics Concern  . Not on file  Social History Narrative   Lives at home with father, stepmother, 38 year old brother.   Additional Social History:    Allergies:   Allergies  Allergen Reactions  . Bee Venom Anaphylaxis  . Fish Allergy Anaphylaxis  . Shellfish Allergy Anaphylaxis  . Tea Anaphylaxis    Labs:  Results for orders placed or performed during the hospital encounter of 09/17/18 (from the past 48 hour(s))  CBG monitoring, ED     Status: Abnormal   Collection Time: 09/17/18 11:20 PM  Result Value Ref Range   Glucose-Capillary 265 (H) 70 - 99 mg/dL  Rapid urine drug screen (hospital performed)     Status: None   Collection Time: 09/18/18 12:31 AM  Result Value Ref Range   Opiates NONE DETECTED NONE DETECTED   Cocaine NONE DETECTED NONE DETECTED   Benzodiazepines NONE DETECTED NONE DETECTED   Amphetamines NONE DETECTED NONE DETECTED   Tetrahydrocannabinol NONE DETECTED NONE DETECTED   Barbiturates NONE DETECTED NONE DETECTED    Comment: (NOTE) DRUG SCREEN FOR MEDICAL PURPOSES ONLY.  IF CONFIRMATION IS NEEDED FOR ANY PURPOSE, NOTIFY LAB WITHIN 5 DAYS. LOWEST DETECTABLE LIMITS FOR URINE DRUG SCREEN Drug Class                     Cutoff (ng/mL) Amphetamine and metabolites    1000 Barbiturate and metabolites    200 Benzodiazepine                 211 Tricyclics and metabolites     300 Opiates and metabolites        300 Cocaine and metabolites        300 THC                            50 Performed at [facility], [place] [address], [place], [place] [address]   Comprehensive metabolic panel     Status: Abnormal   Collection Time: 09/18/18 12:50 AM  Result Value Ref Range   Sodium 139 135 - 145 mmol/L   Potassium 3.2 (L) 3.5 - 5.1 mmol/L   Chloride 103 98 - 111 mmol/L   CO2 27 22 - 32 mmol/L    Glucose, Bld 162 (H) 70 - 99 mg/dL   BUN 8 6 - 20 mg/dL   Creatinine, Ser 0.75 0.61 - 1.24 mg/dL   Calcium 9.0 8.9 - 10.3 mg/dL   Total  Protein 6.6 6.5 - 8.1 g/dL   Albumin 3.7 3.5 - 5.0 g/dL   AST 37 15 - 41 U/L   ALT 33 0 - 44 U/L   Alkaline Phosphatase 108 38 - 126 U/L   Total Bilirubin 0.7 0.3 - 1.2 mg/dL   GFR calc non Af Amer >60 >60 mL/min   GFR calc Af Amer >60 >60 mL/min   Anion gap 9 5 - 15    Comment: Performed at [name], [place] [address] [place], [place] [address]  Ethanol     Status: None   Collection Time: 09/18/18 12:50 AM  Result Value Ref Range   Alcohol, Ethyl (B) <10 <10 mg/dL    Comment: (NOTE) Lowest detectable limit for serum alcohol is 10 mg/dL. For medical purposes only. Performed at [facility], [place] [address], [place], [place] [address]   Salicylate level     Status: None   Collection Time: 09/18/18 12:50 AM  Result Value Ref Range   Salicylate Lvl <1.9 2.8 - 30.0 mg/dL    Comment: Performed at [facility], [place] [address], [place], [place] [address]  Acetaminophen level     Status: Abnormal   Collection Time: 09/18/18 12:50 AM  Result Value Ref Range   Acetaminophen (Tylenol), Serum <10 (L) 10 - 30 ug/mL    Comment: (NOTE) Therapeutic concentrations vary significantly. A range of 10-30 ug/mL  may be an effective concentration for many patients. However, some  are best treated at concentrations outside of this range. Acetaminophen concentrations >150 ug/mL at 4 hours after ingestion  and >50 ug/mL at 12 hours after ingestion are often associated with  toxic reactions. Performed at [facility], [place] [address] [place], [place] [address]   cbc     Status: None   Collection Time: 09/18/18 12:50 AM  Result Value Ref Range   WBC 7.1 4.0 - 10.5 K/uL   RBC 4.69 4.22 - 5.81 MIL/uL   Hemoglobin 13.6 13.0 - 17.0 g/dL   HCT 40.1 39.0 - 52.0 %   MCV 85.5 80.0 - 100.0 fL    MCH 29.0 26.0 - 34.0 pg   MCHC 33.9 30.0 - 36.0 g/dL   RDW 12.4 11.5 - 15.5 %   Platelets 180 150 - 400 K/uL   nRBC 0.0 0.0 - 0.2 %    Comment: Performed at [facility], [place] [address], [place], [place] [address]  CBG monitoring, ED     Status: Abnormal   Collection Time: 09/18/18  1:24 AM  Result Value Ref Range   Glucose-Capillary 144 (H) 70 - 99 mg/dL  CBG monitoring, ED     Status: Abnormal   Collection Time: 09/18/18  7:22 AM  Result Value Ref Range   Glucose-Capillary 279 (H) 70 - 99 mg/dL    Current Facility-Administered Medications  Medication Dose Route Frequency Provider Last Rate Last Dose  . albuterol (PROVENTIL HFA;VENTOLIN HFA) 108 (90 Base) MCG/ACT inhaler 1 puff  1 puff Inhalation Q4H PRN Molpus, John, MD      . alum & mag hydroxide-simeth (MAALOX/MYLANTA) 200-200-20 MG/5ML suspension 30 mL  30 mL Oral Q6H PRN Molpus, John, MD      . FLUoxetine (PROZAC) capsule 20 mg  20 mg Oral Daily Lord, Jamison Y, NP      . insulin aspart (novoLOG) injection 0-15 Units  0-15 Units Subcutaneous TID WC Molpus, John, MD   8 Units at 09/18/18 0740  .  insulin glargine (LANTUS) injection 20 Units  20 Units Subcutaneous BID Molpus, John, MD   20 Units at 09/18/18 1030  . ondansetron (ZOFRAN) tablet 4 mg  4 mg Oral Q8H PRN Molpus, John, MD      . potassium chloride SA (K-DUR,KLOR-CON) CR tablet 40 mEq  40 mEq Oral Daily Molpus, John, MD   40 mEq at 09/18/18 0218   Current Outpatient Medications  Medication Sig Dispense Refill  . albuterol (PROVENTIL HFA;VENTOLIN HFA) 108 (90 Base) MCG/ACT inhaler Inhale 1 puff into the lungs every 4 (four) hours as needed for wheezing or shortness of breath. 1 Inhaler 0  . blood glucose meter kit and supplies Dispense based on patient and insurance preference. Use up to four times daily as directed. (FOR ICD-10 E10.9, E11.9). 1 each 0  . glucose blood (FREESTYLE TEST STRIPS) test strip Use as instructed 100 each 0  . insulin aspart  (NOVOLOG FLEXPEN) 100 UNIT/ML FlexPen Inject 0-15 Units into the skin 3 (three) times daily with meals. In addition to 8 units given if you eat >50% of a meal 60 mL 0  . Insulin Glargine (LANTUS) 100 UNIT/ML Solostar Pen Inject 20 Units into the skin 2 (two) times daily. 15 mL 0  . Insulin Pen Needle 32G X 8 MM MISC Use as directed 100 each 0    Musculoskeletal: Strength & Muscle Tone: within normal limits Gait & Station: normal Patient leans: N/A  Psychiatric Specialty Exam: Physical Exam  Nursing note and vitals reviewed. Constitutional: He is oriented to person, place, and time. He appears well-developed and well-nourished.  HENT:  Head: Normocephalic.  Neck: Normal range of motion.  Respiratory: Effort normal.  Musculoskeletal: Normal range of motion.  Neurological: He is alert and oriented to person, place, and time.  Psychiatric: His speech is normal and behavior is normal. Judgment and thought content normal. His affect is blunt. Cognition and memory are normal. He exhibits a depressed mood.    Review of Systems  Psychiatric/Behavioral: Positive for depression and substance abuse.  All other systems reviewed and are negative.   Blood pressure (!) 135/91, pulse 75, temperature 97.8 F (36.6 C), temperature source Oral, resp. rate 18, height _0  (1.778 m), SpO2 96 %.Body mass index is 26.32 kg/m.  General Appearance: Casual  Eye Contact:  Good  Speech:  Normal Rate  Volume:  Decreased  Mood:  Depressed, mild  Affect:  Blunt  Thought Process:  Coherent and Descriptions of Associations: Intact  Orientation:  Full (Time, Place, and Person)  Thought Content:  WDL and Logical  Suicidal Thoughts:  No  Homicidal Thoughts:  No  Memory:  Immediate;   Fair Recent;   Fair Remote;   Fair  Judgement:  Fair  Insight:  Fair  Psychomotor Activity:  Decreased  Concentration:  Concentration: Fair and Attention Span: Fair  Recall:  [employer] of Knowledge:  Fair  Language:  Good   Akathisia:  No  Handed:  Right  AIMS (if indicated):     Assets:  Leisure Time Physical Health Resilience  ADL's:  Intact  Cognition:  WNL  Sleep:        Treatment Plan Summary: Daily contact with patient to assess and evaluate symptoms and progress in treatment, Medication management and Plan major depressive disorder, recurrent, moderate:  -Started Prozac 20 mg daily  Diabetes, type I: -Restarted Lantus 20 mg BID -Restarted sliding scale insulin  Disposition: Supportive therapy provided about ongoing stressors.  Waylan Boga, NP 09/18/2018  11:03 AM  Patient seen face-to-face for psychiatric evaluation, chart reviewed and case discussed with the physician extender and developed treatment plan. Reviewed the information documented and agree with the treatment plan. Corena Pilgrim, MD

## 2018-09-18 NOTE — ED Provider Notes (Signed)
[facility] DEPT Provider Note: Georgena Spurling, MD, FACEP  CSN: [id number] MRN: [id number] ARRIVAL: 09/17/18 at 2304 ROOM: [facility]  Suicidal   HISTORY OF PRESENT ILLNESS  09/18/18 12:35 AM [name] [name] Chiron Campione. is a 18 y.o. male with type 1 diabetes.  He was admitted 3 days ago for diabetic ketoacidosis and discharged the next day.  He reportedly witnessed his cousin and being murdered a week ago.  Since then he has had suicidal thoughts and states that yesterday he tried to hang himself but the ligature broke from the ceiling.  He also tried to step in front of a bus.  He states he has been having visual and auditory hallucinations.  He has a history of similar thoughts and hallucinations but is usually able to deal with them with his coping mechanisms (listening to music, playing with animals et al).    Past Medical History:  Diagnosis Date  . ADD (attention deficit disorder)   . Allergy   . Anxiety   . Asthma   . Depression   . Epileptic seizure ([place])    "both petit and grand mal; last sz ~ 2 wk ago" (06/17/2013)  . Headache(784.0)    "2-3 times/wk usually" (06/17/2013)  . Heart murmur    "heard a slight one earlier today" (06/17/2013)  . Homeless   . Migraine    "maybe once/month; it's severe" (06/17/2013)  . ODD (oppositional defiant disorder)   . Sickle cell trait ([place])   . Type I diabetes mellitus ([place])    "that's what they're thinking now" (06/17/2013)  . Vision abnormalities    "takes him longer to focus cause; from his sz" (06/17/2013)    Past Surgical History:  Procedure Laterality Date  . CIRCUMCISION  2000  . FINGER SURGERY Left 2001   "crushed pinky; had to repair it" (06/17/2013)    Family History  Problem Relation Age of Onset  . Asthma Mother   . COPD Mother   . Other Mother        possible autoimmune, unclear  . Diabetes Maternal Grandmother   . Heart disease Maternal Grandmother   . Hypertension Maternal Grandmother    . Mental illness Maternal Grandmother   . Heart disease Maternal Grandfather   . Hyperlipidemia Paternal Grandmother   . Hyperlipidemia Paternal Grandfather   . Migraines Sister        Hemiplegic Migraines     Social History   Tobacco Use  . Smoking status: Passive Smoke Exposure - Never Smoker  . Smokeless tobacco: Never Used  . Tobacco comment: Mom and dad smoke outside   Substance Use Topics  . Alcohol use: No    Alcohol/week: 0.0 standard drinks  . Drug use: No    Prior to Admission medications   Medication Sig Start Date End Date Taking? Authorizing Provider  albuterol (PROVENTIL HFA;VENTOLIN HFA) 108 (90 Base) MCG/ACT inhaler Inhale 1 puff into the lungs every 4 (four) hours as needed for wheezing or shortness of breath. 09/16/18   Ghimire, Henreitta Leber, MD  blood glucose meter kit and supplies Dispense based on patient and insurance preference. Use up to four times daily as directed. (FOR ICD-10 E10.9, E11.9). 09/16/18   Ghimire, Henreitta Leber, MD  glucose blood (FREESTYLE TEST STRIPS) test strip Use as instructed 09/16/18   Ghimire, Henreitta Leber, MD  insulin aspart (NOVOLOG FLEXPEN) 100 UNIT/ML FlexPen Inject 0-15 Units into the skin 3 (three) times daily with meals. In addition to 8  units given if you eat >50% of a meal 09/16/18   Ghimire, Henreitta Leber, MD  Insulin Glargine (LANTUS) 100 UNIT/ML Solostar Pen Inject 20 Units into the skin 2 (two) times daily. 09/16/18   Ghimire, Henreitta Leber, MD  Insulin Pen Needle 32G X 8 MM MISC Use as directed 09/16/18   Ghimire, Henreitta Leber, MD    Allergies Bee venom; Fish allergy; Shellfish allergy; and Tea   REVIEW OF SYSTEMS  Negative except as noted here or in the History of Present Illness.   PHYSICAL EXAMINATION  Initial Vital Signs Blood pressure (!) 154/91, pulse (!) 115, temperature 98.6 F (37 C), temperature source Oral, resp. rate 16, height '5\' 10"'  (1.778 m), SpO2 98 %.  Examination General: Well-developed, well-nourished male in no acute  distress; appearance consistent with age of record HENT: normocephalic; atraumatic Eyes: Normal appearance Neck: supple Heart: regular rate and rhythm Lungs: clear to auscultation bilaterally Abdomen: soft; nondistended; nontender; bowel sounds present Extremities: No deformity; full range of motion Neurologic: Awake, alert and oriented; motor function intact in all extremities and symmetric; no facial droop Skin: Warm and dry Psychiatric: Suicidal ideation; auditory visual hallucinations   RESULTS  Summary of this visit's results, reviewed by myself:   EKG Interpretation  Date/Time:    Ventricular Rate:    PR Interval:    QRS Duration:   QT Interval:    QTC Calculation:   R Axis:     Text Interpretation:        Laboratory Studies: Results for orders placed or performed during the hospital encounter of 09/17/18 (from the past 24 hour(s))  CBG monitoring, ED     Status: Abnormal   Collection Time: 09/17/18 11:20 PM  Result Value Ref Range   Glucose-Capillary 265 (H) 70 - 99 mg/dL  Comprehensive metabolic panel     Status: Abnormal   Collection Time: 09/18/18 12:50 AM  Result Value Ref Range   Sodium 139 135 - 145 mmol/L   Potassium 3.2 (L) 3.5 - 5.1 mmol/L   Chloride 103 98 - 111 mmol/L   CO2 27 22 - 32 mmol/L   Glucose, Bld 162 (H) 70 - 99 mg/dL   BUN 8 6 - 20 mg/dL   Creatinine, Ser 0.75 0.61 - 1.24 mg/dL   Calcium 9.0 8.9 - 10.3 mg/dL   Total Protein 6.6 6.5 - 8.1 g/dL   Albumin 3.7 3.5 - 5.0 g/dL   AST 37 15 - 41 U/L   ALT 33 0 - 44 U/L   Alkaline Phosphatase 108 38 - 126 U/L   Total Bilirubin 0.7 0.3 - 1.2 mg/dL   GFR calc non Af Amer >60 >60 mL/min   GFR calc Af Amer >60 >60 mL/min   Anion gap 9 5 - 15  Ethanol     Status: None   Collection Time: 09/18/18 12:50 AM  Result Value Ref Range   Alcohol, Ethyl (B) <62 <13 mg/dL  Salicylate level     Status: None   Collection Time: 09/18/18 12:50 AM  Result Value Ref Range   Salicylate Lvl <0.8 2.8 - 30.0  mg/dL  Acetaminophen level     Status: Abnormal   Collection Time: 09/18/18 12:50 AM  Result Value Ref Range   Acetaminophen (Tylenol), Serum <10 (L) 10 - 30 ug/mL  cbc     Status: None   Collection Time: 09/18/18 12:50 AM  Result Value Ref Range   WBC 7.1 4.0 - 10.5 K/uL   RBC 4.69 4.22 -  5.81 MIL/uL   Hemoglobin 13.6 13.0 - 17.0 g/dL   HCT 40.1 39.0 - 52.0 %   MCV 85.5 80.0 - 100.0 fL   MCH 29.0 26.0 - 34.0 pg   MCHC 33.9 30.0 - 36.0 g/dL   RDW 12.4 11.5 - 15.5 %   Platelets 180 150 - 400 K/uL   nRBC 0.0 0.0 - 0.2 %  CBG monitoring, ED     Status: Abnormal   Collection Time: 09/18/18  1:24 AM  Result Value Ref Range   Glucose-Capillary 144 (H) 70 - 99 mg/dL   Imaging Studies: No results found.  ED COURSE and MDM  Nursing notes and initial vitals signs, including pulse oximetry, reviewed.  Vitals:   09/17/18 2313 09/18/18 0026  BP: (!) 157/101 (!) 154/91  Pulse: (!) 124 (!) 115  Resp: 16 16  Temp: 98.2 F (36.8 C) 98.6 F (37 C)  TempSrc: Oral Oral  SpO2: 100% 98%  Height: '5\' 10"'  (1.778 m)    Suspect brief reactive psychosis given recent witnessing of his cousin being murdered.  PROCEDURES    ED DIAGNOSES     ICD-10-CM   1. 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

## 2018-09-19 ENCOUNTER — Other Ambulatory Visit: Payer: Self-pay

## 2018-09-19 LAB — CBG MONITORING, ED: Glucose-Capillary: 257 mg/dL — ABNORMAL HIGH (ref 70–99)

## 2018-09-19 MED ORDER — TRAZODONE HCL 100 MG PO TABS: 100.0000 mg | ORAL_TABLET | Freq: Every evening | ORAL | 0 refills | Status: DC | PRN

## 2018-09-19 MED ORDER — FLUOXETINE HCL 20 MG PO CAPS: 20.0000 mg | ORAL_CAPSULE | Freq: Every day | ORAL | 0 refills | Status: DC

## 2018-09-19 NOTE — ED Notes (Signed)
CBG 257 checked after pt ate, administered 8u insulin

## 2018-09-19 NOTE — Consult Note (Addendum)
[facility] Psych ED Discharge  09/19/2018 10:46 AM [name] [name].  MRN:  [id number] Principal Problem: MDD (major depressive disorder), recurrent episode, moderate ([place]) Discharge Diagnoses: Principal Problem:   MDD (major depressive disorder), recurrent episode, moderate (HCC)  Subjective: 18 yo male who came to the ED with depression and suicidal ideations.  Today he continues to deny suicidal ideations and wants to leave.  He wanted to leave AMA yesterday but agreed to stay.  2/10 anxiety at this time with no homicidal ideations or hallucinations.  No substance abuse.  He slept well and has been eating well.  Prozac was started yesterday, tolerating well with no adverse effects.  Stable for discharge.  Total Time spent with patient: 45 minutes  Past Psychiatric History: depression  Past Medical History:  Past Medical History:  Diagnosis Date  . ADD (attention deficit disorder)   . Allergy   . Anxiety   . Asthma   . Depression   . Epileptic seizure ([place])    "both petit and grand mal; last sz ~ 2 wk ago" (06/17/2013)  . Headache(784.0)    "2-3 times/wk usually" (06/17/2013)  . Heart murmur    "heard a slight one earlier today" (06/17/2013)  . Homeless   . Migraine    "maybe once/month; it's severe" (06/17/2013)  . ODD (oppositional defiant disorder)   . Sickle cell trait ([place])   . Type I diabetes mellitus ([place])    "that's what they're thinking now" (06/17/2013)  . Vision abnormalities    "takes him longer to focus cause; from his sz" (06/17/2013)    Past Surgical History:  Procedure Laterality Date  . CIRCUMCISION  2000  . FINGER SURGERY Left 2001   "crushed pinky; had to repair it" (06/17/2013)   Family History:  Family History  Problem Relation Age of Onset  . Asthma Mother   . COPD Mother   . Other Mother        possible autoimmune, unclear  . Diabetes Maternal Grandmother   . Heart disease Maternal Grandmother   . Hypertension Maternal Grandmother   . Mental  illness Maternal Grandmother   . Heart disease Maternal Grandfather   . Hyperlipidemia Paternal Grandmother   . Hyperlipidemia Paternal Grandfather   . Migraines Sister        Hemiplegic Migraines    Family Psychiatric  History: see above Social History:  Social History   Substance and Sexual Activity  Alcohol Use No  . Alcohol/week: 0.0 standard drinks     Social History   Substance and Sexual Activity  Drug Use No    Social History   Socioeconomic History  . Marital status: Single    Spouse name: Not on file  . Number of children: Not on file  . Years of education: Not on file  . Highest education level: Not on file  Occupational History  . Not on file  Social Needs  . Financial resource strain: Not on file  . Food insecurity:    Worry: Not on file    Inability: Not on file  . Transportation needs:    Medical: Not on file    Non-medical: Not on file  Tobacco Use  . Smoking status: Passive Smoke Exposure - Never Smoker  . Smokeless tobacco: Never Used  . Tobacco comment: Mom and dad smoke outside   Substance and Sexual Activity  . Alcohol use: No    Alcohol/week: 0.0 standard drinks  . Drug use: No  . Sexual activity: Never  Lifestyle  . Physical activity:    Days per week: Not on file    Minutes per session: Not on file  . Stress: Not on file  Relationships  . Social connections:    Talks on phone: Not on file    Gets together: Not on file    Attends religious service: Not on file    Active member of club or organization: Not on file    Attends meetings of clubs or organizations: Not on file    Relationship status: Not on file  Other Topics Concern  . Not on file  Social History Narrative   Lives at home with father, stepmother, 20 year old brother.    Has this patient used any form of tobacco in the last 30 days? (Cigarettes, Smokeless Tobacco, Cigars, and/or Pipes) NA  Current Medications: Current Facility-Administered Medications  Medication  Dose Route Frequency Provider Last Rate Last Dose  . albuterol (PROVENTIL HFA;VENTOLIN HFA) 108 (90 Base) MCG/ACT inhaler 1 puff  1 puff Inhalation Q4H PRN Molpus, John, MD      . alum & mag hydroxide-simeth (MAALOX/MYLANTA) 200-200-20 MG/5ML suspension 30 mL  30 mL Oral Q6H PRN Molpus, John, MD      . FLUoxetine (PROZAC) capsule 20 mg  20 mg Oral Daily Patrecia Pour, NP   20 mg at 09/19/18 0931  . guaiFENesin-dextromethorphan (ROBITUSSIN DM) 100-10 MG/5ML syrup 5 mL  5 mL Oral Q4H PRN Daleen Bo, MD      . insulin aspart (novoLOG) injection 0-15 Units  0-15 Units Subcutaneous TID WC Molpus, John, MD   8 Units at 09/19/18 0729  . insulin glargine (LANTUS) injection 20 Units  20 Units Subcutaneous BID Molpus, John, MD   20 Units at 09/19/18 0931  . ondansetron (ZOFRAN) tablet 4 mg  4 mg Oral Q8H PRN Molpus, John, MD      . potassium chloride SA (K-DUR,KLOR-CON) CR tablet 40 mEq  40 mEq Oral Daily Molpus, John, MD   40 mEq at 09/19/18 0931  . traZODone (DESYREL) tablet 100 mg  100 mg Oral QHS PRN Lindon Romp A, NP   100 mg at 09/18/18 2239   Current Outpatient Medications  Medication Sig Dispense Refill  . albuterol (PROVENTIL HFA;VENTOLIN HFA) 108 (90 Base) MCG/ACT inhaler Inhale 1 puff into the lungs every 4 (four) hours as needed for wheezing or shortness of breath. 1 Inhaler 0  . blood glucose meter kit and supplies Dispense based on patient and insurance preference. Use up to four times daily as directed. (FOR ICD-10 E10.9, E11.9). 1 each 0  . glucose blood (FREESTYLE TEST STRIPS) test strip Use as instructed 100 each 0  . insulin aspart (NOVOLOG FLEXPEN) 100 UNIT/ML FlexPen Inject 0-15 Units into the skin 3 (three) times daily with meals. In addition to 8 units given if you eat >50% of a meal 60 mL 0  . Insulin Glargine (LANTUS) 100 UNIT/ML Solostar Pen Inject 20 Units into the skin 2 (two) times daily. 15 mL 0  . Insulin Pen Needle 32G X 8 MM MISC Use as directed 100 each 0   PTA  Medications: (Not in a hospital admission)   Musculoskeletal: Strength & Muscle Tone: within normal limits Gait & Station: normal Patient leans: N/A  Psychiatric Specialty Exam: Physical Exam  Nursing note and vitals reviewed. Constitutional: He is oriented to person, place, and time. He appears well-developed and well-nourished.  HENT:  Head: Normocephalic.  Neck: Normal range of motion.  Respiratory: Effort  normal.  Musculoskeletal: Normal range of motion.  Neurological: He is alert and oriented to person, place, and time.  Psychiatric: His speech is normal and behavior is normal. Judgment and thought content normal. His mood appears anxious. Cognition and memory are normal.    Review of Systems  Psychiatric/Behavioral: The patient is nervous/anxious.   All other systems reviewed and are negative.   Blood pressure 120/78, pulse 68, temperature 98 F (36.7 C), temperature source Oral, resp. rate 18, height _0  (1.778 m), SpO2 95 %.Body mass index is 26.32 kg/m.  General Appearance: Casual  Eye Contact:  Good  Speech:  Normal Rate  Volume:  Normal  Mood:  Anxious  Affect:  Congruent  Thought Process:  Coherent and Descriptions of Associations: Intact  Orientation:  Full (Time, Place, and Person)  Thought Content:  WDL and Logical  Suicidal Thoughts:  No  Homicidal Thoughts:  No  Memory:  Immediate;   Good Recent;   Good Remote;   Good  Judgement:  Good  Insight:  Good  Psychomotor Activity:  Normal  Concentration:  Concentration: Good and Attention Span: Good  Recall:  Good  Fund of Knowledge:  Good  Language:  Good  Akathisia:  No  Handed:  Right  AIMS (if indicated):     Assets:  Leisure Time Physical Health Resilience Social Support  ADL's:  Intact  Cognition:  WNL  Sleep:       Demographic Factors:  Male and Adolescent or young adult  Loss Factors: NA  Historical Factors: NA  Risk Reduction Factors:   Sense of responsibility to family,  Living with another person, especially a relative, Positive social support and Positive therapeutic relationship  Continued Clinical Symptoms:  Anxiety, mild  Cognitive Features That Contribute To Risk:  None    Suicide Risk:  Minimal: No identifiable suicidal ideation.  Patients presenting with no risk factors but with morbid ruminations; may be classified as minimal risk based on the severity of the depressive symptoms   Plan Of Care/Follow-up recommendations:  Major depressive disorder, moderate: -Started Prozac 20  Mg daily  Insomnia: -Started Trazodone 100 gm at bedtime PRN  Diabetes: -Continued insulin Activity:  as tolerated Diet:  heart healthy diet  Disposition: discharge home Waylan Boga, NP 09/19/2018, 10:46 AM  Patient seen face-to-face for psychiatric evaluation, chart reviewed and case discussed with the physician extender and developed treatment plan. Reviewed the information documented and agree with the treatment plan. Corena Pilgrim, MD

## 2018-09-19 NOTE — ED Notes (Signed)
All belongings given to patient and educated on meds/discharge instruction

## 2018-09-19 NOTE — ED Notes (Signed)
Continues to denies SI

## 2018-09-19 NOTE — ED Notes (Signed)
Patient denies pain and is resting comfortably.  

## 2018-09-23 ENCOUNTER — Encounter (HOSPITAL_COMMUNITY): Payer: Self-pay | Admitting: Emergency Medicine

## 2018-09-23 ENCOUNTER — Observation Stay (HOSPITAL_COMMUNITY)
Admission: EM | Admit: 2018-09-23 | Discharge: 2018-09-24 | Disposition: A | Discharge status: Home / Self Care | Payer: Medicaid Other | Attending: Internal Medicine | Admitting: Internal Medicine

## 2018-09-23 ENCOUNTER — Other Ambulatory Visit: Payer: Self-pay

## 2018-09-23 DIAGNOSIS — F603 Borderline personality disorder: Secondary | ICD-10-CM | POA: Insufficient documentation

## 2018-09-23 DIAGNOSIS — Z79899 Other long term (current) drug therapy: Secondary | ICD-10-CM | POA: Diagnosis not present

## 2018-09-23 DIAGNOSIS — N529 Male erectile dysfunction, unspecified: Secondary | ICD-10-CM | POA: Insufficient documentation

## 2018-09-23 DIAGNOSIS — Z8249 Family history of ischemic heart disease and other diseases of the circulatory system: Secondary | ICD-10-CM | POA: Insufficient documentation

## 2018-09-23 DIAGNOSIS — F4325 Adjustment disorder with mixed disturbance of emotions and conduct: Secondary | ICD-10-CM | POA: Insufficient documentation

## 2018-09-23 DIAGNOSIS — F333 Major depressive disorder, recurrent, severe with psychotic symptoms: Secondary | ICD-10-CM | POA: Insufficient documentation

## 2018-09-23 DIAGNOSIS — Z9114 Patient's other noncompliance with medication regimen: Secondary | ICD-10-CM | POA: Insufficient documentation

## 2018-09-23 DIAGNOSIS — F419 Anxiety disorder, unspecified: Secondary | ICD-10-CM | POA: Diagnosis not present

## 2018-09-23 DIAGNOSIS — D573 Sickle-cell trait: Secondary | ICD-10-CM | POA: Insufficient documentation

## 2018-09-23 DIAGNOSIS — Z7722 Contact with and (suspected) exposure to environmental tobacco smoke (acute) (chronic): Secondary | ICD-10-CM | POA: Diagnosis not present

## 2018-09-23 DIAGNOSIS — Z794 Long term (current) use of insulin: Secondary | ICD-10-CM | POA: Diagnosis not present

## 2018-09-23 DIAGNOSIS — F509 Eating disorder, unspecified: Secondary | ICD-10-CM | POA: Insufficient documentation

## 2018-09-23 DIAGNOSIS — F902 Attention-deficit hyperactivity disorder, combined type: Secondary | ICD-10-CM | POA: Diagnosis not present

## 2018-09-23 DIAGNOSIS — E1010 Type 1 diabetes mellitus with ketoacidosis without coma: Principal | ICD-10-CM | POA: Insufficient documentation

## 2018-09-23 DIAGNOSIS — Z590 Homelessness: Secondary | ICD-10-CM | POA: Insufficient documentation

## 2018-09-23 DIAGNOSIS — D509 Iron deficiency anemia, unspecified: Secondary | ICD-10-CM | POA: Diagnosis not present

## 2018-09-23 DIAGNOSIS — F988 Other specified behavioral and emotional disorders with onset usually occurring in childhood and adolescence: Secondary | ICD-10-CM | POA: Diagnosis not present

## 2018-09-23 DIAGNOSIS — Z6826 Body mass index (BMI) 26.0-26.9, adult: Secondary | ICD-10-CM | POA: Diagnosis not present

## 2018-09-23 DIAGNOSIS — N179 Acute kidney failure, unspecified: Secondary | ICD-10-CM | POA: Diagnosis not present

## 2018-09-23 DIAGNOSIS — J45909 Unspecified asthma, uncomplicated: Secondary | ICD-10-CM | POA: Diagnosis not present

## 2018-09-23 DIAGNOSIS — E1110 Type 2 diabetes mellitus with ketoacidosis without coma: Secondary | ICD-10-CM | POA: Diagnosis present

## 2018-09-23 LAB — CBG MONITORING, ED: Glucose-Capillary: >600 mg/dL (ref 70–99)

## 2018-09-23 MED ORDER — SODIUM CHLORIDE 0.9 % IV BOLUS
2000.0000 mL | Freq: Once | INTRAVENOUS | Status: AC
  Administered 2018-09-24: 2000 mL via INTRAVENOUS

## 2018-09-23 NOTE — ED Triage Notes (Signed)
Pt reports that he hasn't been taking his insulin or checking his blood sugar due to homelessness. Denies nausea/vomiting/diarrhea.

## 2018-09-23 NOTE — ED Notes (Signed)
Patient reports taking meth and crack cocaine yesterday. States "felt crazy ever since then".

## 2018-09-24 ENCOUNTER — Encounter (HOSPITAL_COMMUNITY): Payer: Self-pay | Admitting: Internal Medicine

## 2018-09-24 LAB — URINALYSIS, ROUTINE W REFLEX MICROSCOPIC
Bacteria, UA: NONE SEEN
Bilirubin Urine: NEGATIVE
Glucose, UA: >=500 mg/dL — AB
Hgb urine dipstick: NEGATIVE
Ketones, ur: 80 mg/dL — AB
Leukocytes,Ua: NEGATIVE
NITRITE: NEGATIVE
PROTEIN: NEGATIVE mg/dL
Specific Gravity, Urine: 1.02 (ref 1.005–1.030)
pH: 5 (ref 5.0–8.0)

## 2018-09-24 LAB — POCT I-STAT 7, (LYTES, BLD GAS, ICA,H+H)
ACID-BASE DEFICIT: 10 mmol/L — AB (ref 0.0–2.0)
Bicarbonate: 14.3 mmol/L — ABNORMAL LOW (ref 20.0–28.0)
Calcium, Ion: 1.19 mmol/L (ref 1.15–1.40)
HCT: 46 % (ref 39.0–52.0)
Hemoglobin: 15.6 g/dL (ref 13.0–17.0)
O2 Saturation: 97 %
PH ART: 7.311 — AB (ref 7.350–7.450)
Patient temperature: 98.6
Potassium: 4.5 mmol/L (ref 3.5–5.1)
Sodium: 122 mmol/L — ABNORMAL LOW (ref 135–145)
TCO2: 15 mmol/L — ABNORMAL LOW (ref 22–32)
pCO2 arterial: 28.4 mmHg — ABNORMAL LOW (ref 32.0–48.0)
pO2, Arterial: 103 mmHg (ref 83.0–108.0)

## 2018-09-24 LAB — CBC
HCT: 46.8 % (ref 39.0–52.0)
Hemoglobin: 15.4 g/dL (ref 13.0–17.0)
MCH: 28.2 pg (ref 26.0–34.0)
MCHC: 32.9 g/dL (ref 30.0–36.0)
MCV: 85.7 fL (ref 80.0–100.0)
NRBC: 0 % (ref 0.0–0.2)
Platelets: 330 10*3/uL (ref 150–400)
RBC: 5.46 MIL/uL (ref 4.22–5.81)
RDW: 11.9 % (ref 11.5–15.5)
WBC: 7.2 10*3/uL (ref 4.0–10.5)

## 2018-09-24 LAB — CBG MONITORING, ED: Glucose-Capillary: 557 mg/dL (ref 70–99)

## 2018-09-24 LAB — BASIC METABOLIC PANEL
Anion gap: 18 — ABNORMAL HIGH (ref 5–15)
BUN: 17 mg/dL (ref 6–20)
CO2: 16 mmol/L — ABNORMAL LOW (ref 22–32)
Calcium: 8.5 mg/dL — ABNORMAL LOW (ref 8.9–10.3)
Chloride: 88 mmol/L — ABNORMAL LOW (ref 98–111)
Creatinine, Ser: 1.57 mg/dL — ABNORMAL HIGH (ref 0.61–1.24)
GFR calc Af Amer: >60 mL/min (ref >60)
GFR calc non Af Amer: >60 mL/min (ref >60)
Glucose, Bld: 845 mg/dL (ref 70–99)
Potassium: 4.6 mmol/L (ref 3.5–5.1)
Sodium: 122 mmol/L — ABNORMAL LOW (ref 135–145)

## 2018-09-24 MED ORDER — DEXTROSE-NACL 5-0.45 % IV SOLN: INTRAVENOUS | Status: DC

## 2018-09-24 MED ORDER — FLUOXETINE HCL 20 MG PO CAPS: 20.0000 mg | ORAL_CAPSULE | Freq: Every day | ORAL | Status: DC

## 2018-09-24 MED ORDER — INSULIN REGULAR(HUMAN) IN NACL 100-0.9 UT/100ML-% IV SOLN
INTRAVENOUS | Status: DC
  Administered 2018-09-24: 5 [IU]/h via INTRAVENOUS
  Filled 2018-09-24: qty 100

## 2018-09-24 MED ORDER — SODIUM CHLORIDE 0.9 % IV SOLN: INTRAVENOUS | Status: DC

## 2018-09-24 MED ORDER — POTASSIUM CHLORIDE 10 MEQ/100ML IV SOLN
10.0000 meq | INTRAVENOUS | Status: DC
  Administered 2018-09-24: 10 meq via INTRAVENOUS
  Filled 2018-09-24: qty 100

## 2018-09-24 MED ORDER — LORAZEPAM 2 MG/ML IJ SOLN
0.5000 mg | Freq: Once | INTRAMUSCULAR | Status: AC
  Administered 2018-09-24: 0.5 mg via INTRAVENOUS
  Filled 2018-09-24: qty 1

## 2018-09-24 MED ORDER — INSULIN REGULAR(HUMAN) IN NACL 100-0.9 UT/100ML-% IV SOLN: INTRAVENOUS | Status: DC

## 2018-09-24 MED ORDER — TRAZODONE HCL 50 MG PO TABS: 100.0000 mg | ORAL_TABLET | Freq: Every evening | ORAL | Status: DC | PRN

## 2018-09-24 MED ORDER — ALBUTEROL SULFATE (2.5 MG/3ML) 0.083% IN NEBU: 2.5000 mg | INHALATION_SOLUTION | RESPIRATORY_TRACT | Status: DC | PRN

## 2018-09-24 MED ORDER — ENOXAPARIN SODIUM 40 MG/0.4ML ~~LOC~~ SOLN: 40.0000 mg | Freq: Every day | SUBCUTANEOUS | Status: DC

## 2018-09-24 NOTE — ED Notes (Signed)
Pt given water ok per [facility]

## 2018-09-24 NOTE — ED Notes (Signed)
Admitting MD paged about patient leaving AMA.

## 2018-09-24 NOTE — ED Notes (Signed)
Wasted 1.5 mL Ativan in sharps container with Reita Cliche, Charity fundraiser.

## 2018-09-24 NOTE — ED Notes (Addendum)
Patient attempted to leave. Pulled all cords off and tried to pull out IV while this RN was in the room. Patient states "I don't want to be left alone and will leave if you leave me alone". MD called to room who went over side effects if patient was to leave AMA, sugar continue to rise, organ failure, and death. Patient understands and repeated these back to MD and RN.    Patient eventually agreed to stay at this time if he was given a [place] sandwich and milk. MD agrees to give patient food at this time.

## 2018-09-24 NOTE — ED Notes (Signed)
Pt given a [place] sandwich, apple sauce and 2 milks

## 2018-09-24 NOTE — ED Notes (Signed)
ED TO INPATIENT HANDOFF REPORT  ED Nurse Name and Phone #:  Florentina Addison [phone]  S Name/Age/Gender [name] [name] 18 y.o. male Room/Bed: 017C/017C  Code Status   Code Status: Prior  Home/SNF/Other Patient is homeless.  Patient oriented to: self, place, time and situation Is this baseline? Yes   Triage Complete: Triage complete  Chief Complaint Hyperglycemia   Triage Note Pt reports that he hasn't been taking his insulin or checking his blood sugar due to homelessness. Denies nausea/vomiting/diarrhea.    Allergies Allergies  Allergen Reactions  . Bee Venom Anaphylaxis  . Fish Allergy Anaphylaxis  . Shellfish Allergy Anaphylaxis  . Tea Anaphylaxis    Level of Care/Admitting Diagnosis ED Disposition    ED Disposition Condition Comment   Admit  Hospital Area: [facility] [[id number]]  Level of Care: Progressive [102]  I expect the patient will be discharged within 24 hours: No (not a candidate for 5C-Observation unit)  Diagnosis: DKA (diabetic ketoacidoses) [facility]) [[id number]]  Admitting Physician: Eduard Clos [phone]  Attending Physician: Eduard Clos Florian.Pax  PT Class (Do Not Modify): Observation [104]  PT Acc Code (Do Not Modify): Observation [10022]       B Medical/Surgery History Past Medical History:  Diagnosis Date  . ADD (attention deficit disorder)   . Allergy   . Anxiety   . Asthma   . Depression   . Epileptic seizure (HCC)    "both petit and grand mal; last sz ~ 2 wk ago" (06/17/2013)  . Headache(784.0)    "2-3 times/wk usually" (06/17/2013)  . Heart murmur    "heard a slight one earlier today" (06/17/2013)  . Homeless   . Migraine    "maybe once/month; it's severe" (06/17/2013)  . ODD (oppositional defiant disorder)   . Sickle cell trait (HCC)   . Type I diabetes mellitus (HCC)    "that's what they're thinking now" (06/17/2013)  . Vision abnormalities    "takes him longer to focus cause; from his sz" (06/17/2013)    Past Surgical History:  Procedure Laterality Date  . CIRCUMCISION  2000  . FINGER SURGERY Left 2001   "crushed pinky; had to repair it" (06/17/2013)     A IV Location/Drains/Wounds Patient Lines/Drains/Airways Status   Active Line/Drains/Airways    Name:   Placement date:   Placement time:   Site:   Days:   Peripheral IV 09/24/18 Right Antecubital   09/24/18    0001    Antecubital   less than 1   Wound / Incision (Open or Dehisced) 04/04/18 Non-pressure wound Foot Right   04/04/18    1900    Foot   173          Intake/Output Last 24 hours  Intake/Output Summary (Last 24 hours) at 09/24/2018 0209 Last data filed at 09/24/2018 0026 Gross per 24 hour  Intake -  Output 1000 ml  Net -1000 ml    Labs/Imaging Results for orders placed or performed during the hospital encounter of 09/23/18 (from the past 48 hour(s))  CBG monitoring, ED     Status: Abnormal   Collection Time: 09/23/18 10:49 PM  Result Value Ref Range   Glucose-Capillary >600 (HH) 70 - 99 mg/dL  I-STAT 7, (LYTES, BLD GAS, ICA, H+H)     Status: Abnormal   Collection Time: 09/24/18 12:03 AM  Result Value Ref Range   pH, Arterial 7.311 (L) 7.350 - 7.450   pCO2 arterial 28.4 (L) 32.0 - 48.0 mmHg  pO2, Arterial 103.0 83.0 - 108.0 mmHg   Bicarbonate 14.3 (L) 20.0 - 28.0 mmol/L   TCO2 15 (L) 22 - 32 mmol/L   O2 Saturation 97.0 %   Acid-base deficit 10.0 (H) 0.0 - 2.0 mmol/L   Sodium 122 (L) 135 - 145 mmol/L   Potassium 4.5 3.5 - 5.1 mmol/L   Calcium, Ion 1.19 1.15 - 1.40 mmol/L   HCT 46.0 39.0 - 52.0 %   Hemoglobin 15.6 13.0 - 17.0 g/dL   Patient temperature 16.198.6 F    Sample type ARTERIAL   Basic metabolic panel     Status: Abnormal   Collection Time: 09/24/18 12:06 AM  Result Value Ref Range   Sodium 122 (L) 135 - 145 mmol/L   Potassium 4.6 3.5 - 5.1 mmol/L   Chloride 88 (L) 98 - 111 mmol/L   CO2 16 (L) 22 - 32 mmol/L   Glucose, Bld 845 (HH) 70 - 99 mg/dL    Comment: CRITICAL RESULT CALLED TO, READ BACK  BY AND VERIFIED WITH: [facility],RN 09/24/18 0043 WAYK    BUN 17 6 - 20 mg/dL   Creatinine, Ser 0.961.57 (H) 0.61 - 1.24 mg/dL   Calcium 8.5 (L) 8.9 - 10.3 mg/dL   GFR calc non Af Amer >60 >60 mL/min   GFR calc Af Amer >60 >60 mL/min   Anion gap 18 (H) 5 - 15    Comment: Performed at [facility], [address] [place], [place] [address]  CBC     Status: None   Collection Time: 09/24/18 12:06 AM  Result Value Ref Range   WBC 7.2 4.0 - 10.5 K/uL   RBC 5.46 4.22 - 5.81 MIL/uL   Hemoglobin 15.4 13.0 - 17.0 g/dL   HCT 98.146.8 19.139.0 - 47.852.0 %   MCV 85.7 80.0 - 100.0 fL   MCH 28.2 26.0 - 34.0 pg   MCHC 32.9 30.0 - 36.0 g/dL   RDW 29.511.9 62.111.5 - 30.815.5 %   Platelets 330 150 - 400 K/uL   nRBC 0.0 0.0 - 0.2 %    Comment: Performed at [facility], [address] [place], [place] [address]  Urinalysis, Routine w reflex microscopic     Status: Abnormal   Collection Time: 09/24/18 12:23 AM  Result Value Ref Range   Color, Urine COLORLESS (A) YELLOW   APPearance CLEAR CLEAR   Specific Gravity, Urine 1.020 1.005 - 1.030   pH 5.0 5.0 - 8.0   Glucose, UA >=500 (A) NEGATIVE mg/dL   Hgb urine dipstick NEGATIVE NEGATIVE   Bilirubin Urine NEGATIVE NEGATIVE   Ketones, ur 80 (A) NEGATIVE mg/dL   Protein, ur NEGATIVE NEGATIVE mg/dL   Nitrite NEGATIVE NEGATIVE   Leukocytes,Ua NEGATIVE NEGATIVE   Bacteria, UA NONE SEEN NONE SEEN   Mucus PRESENT     Comment: Performed at [facility], [address] [place], [place] [address]  CBG monitoring, ED     Status: Abnormal   Collection Time: 09/24/18  1:55 AM  Result Value Ref Range   Glucose-Capillary 557 (HH) 70 - 99 mg/dL   Comment 1 Notify RN    No results found.  Pending Labs Unresulted Labs (From admission, onward)   None      Vitals/Pain Today's Vitals   09/23/18 2251 09/23/18 2347 09/24/18 0006 09/24/18 0206  BP: (!) 139/96 (!) 142/88  (!) 143/93  Pulse: 91   94  Resp: 18   18  Temp: 97.7 F (36.5  C)     TempSrc: Oral      SpO2: 100%   98%  Height:      PainSc:   6      Isolation Precautions No active isolations  Medications Medications  insulin regular, human (MYXREDLIN) 100 units/ 100 mL infusion (5 Units/hr Intravenous New Bag/Given 09/24/18 0201)  potassium chloride 10 mEq in 100 mL IVPB (10 mEq Intravenous New Bag/Given 09/24/18 0155)  sodium chloride 0.9 % bolus 2,000 mL (2,000 mLs Intravenous New Bag/Given 09/24/18 0005)  LORazepam (ATIVAN) injection 0.5 mg (0.5 mg Intravenous Given 09/24/18 0152)    Mobility walks Low fall risk   Focused Assessments Cardiac Assessment Handoff:    Lab Results  Component Value Date   CKTOTAL 288 07/12/2018   TROPONINI <0.03 08/09/2018   Lab Results  Component Value Date   DDIMER 0.30 08/18/2018   Does the Patient currently have chest pain? No  , Neuro Assessment Handoff:          Neuro Assessment:   Neuro Checks:      Last Documented NIHSS Modified Score:    If patient is a Neuro Trauma and patient is going to OR before floor call report to 4N Charge nurse: [phone] or [phone]     R Recommendations: See Admitting Provider Note  Report given to:   Additional Notes: Patient given [place] sandwich and PO fluids per MD before patient attempted to leave AMA. Patient is homeless but has a house arrest band on his ankle.

## 2018-09-24 NOTE — ED Provider Notes (Signed)
2:41 AM Walked by patient's room and noticed that he was no longer present.  There was blood droplets on the floor and evidence that the patient had pulled out his own IV.  The patient previously had contemplated leaving the emergency department.  I verbalized to him on 2 occasions that leaving the emergency department could lead to clinical decompensation including worsening of his blood sugar, organ failure, and death.  He verbalized understanding of the risks of leaving AGAINST MEDICAL ADVICE with full capacity to make this decision.  Had subsequently agreed to remain inpatient for treatment, but later expressed to nursing that he desired to leave the department.  He was seen ambulating out of the department in stable condition.  Disposition changed to AMA.   Antony Madura, PA-C 09/24/18 0177    Derwood Kaplan, MD 09/24/18 [phone]

## 2018-09-24 NOTE — ED Notes (Signed)
This RN went to check on patient and let him know I was calling report. Patient met RN at the door, ripped IV out and walked down the hall. Attempted to stop patient to assess IV site, patient continued to cuss and walk out the hospital.   Informed patient he was leaving against medical advice as earlier discussed. Patient again continued to walk away.

## 2018-09-24 NOTE — ED Notes (Signed)
Check CBG 559, RN Katie informed

## 2018-09-24 NOTE — ED Provider Notes (Addendum)
[facility] EMERGENCY DEPARTMENT Provider Note   CSN: [id number] Arrival date & time: 09/23/18  2242    History   Chief Complaint Chief Complaint  Patient presents with  . Hyperglycemia    HPI [name] [name]. is a 18 y.o. male.     18 y/o male with hx of IDDM, asthma, anxiety, homelessness presents to the emergency department for evaluation of polyuria and polydipsia.  States that he last ate 4 days ago secondary to homelessness.  He has not been taking his insulin as he cannot afford his medication.  He cannot recall the last time he used insulin outside of the hospital setting.  Endorses using methamphetamines and crack cocaine yesterday.  Has not liked how he has felt since using these drugs.  No nausea, vomiting, diarrhea, fever.  He is currently homeless and alleges that his mother was deported and his father is in a Public house manager facility in [place].  The history is provided by the patient. No language interpreter was used.  Hyperglycemia    Past Medical History:  Diagnosis Date  . ADD (attention deficit disorder)   . Allergy   . Anxiety   . Asthma   . Depression   . Epileptic seizure ([place])    "both petit and grand mal; last sz ~ 2 wk ago" (06/17/2013)  . Headache(784.0)    "2-3 times/wk usually" (06/17/2013)  . Heart murmur    "heard a slight one earlier today" (06/17/2013)  . Homeless   . Migraine    "maybe once/month; it's severe" (06/17/2013)  . ODD (oppositional defiant disorder)   . Sickle cell trait ([place])   . Type I diabetes mellitus ([place])    "that's what they're thinking now" (06/17/2013)  . Vision abnormalities    "takes him longer to focus cause; from his sz" (06/17/2013)    Patient Active Problem List   Diagnosis Date Noted  . AKI (acute kidney injury) ([place]) 09/15/2018  . URI (upper respiratory infection) 09/15/2018  . Adjustment disorder with mixed disturbance of emotions and conduct   . Atypical chest pain  08/19/2018  . Borderline personality disorder ([place]) 02/26/2018  . MDD (major depressive disorder), recurrent, severe, with psychosis ([place]) 02/25/2018  . Major depressive disorder, recurrent severe without psychotic features ([place]) 07/24/2017  . DKA, type 1 ([place]) 07/16/2017  . Nausea & vomiting 07/16/2017  . Uncontrolled type 1 diabetes circulatory disorder erectile dysfunction ([place])   . DKA (diabetic ketoacidoses) ([place]) 12/18/2016  . History of seizures 12/01/2016  . History of migraine 12/01/2016  . Disordered eating 03/08/2015  . Ketonuria   . Adjustment reaction to medical therapy   . Non compliance w medication regimen   . Diabetic peripheral neuropathy associated with type 1 diabetes mellitus ([place]) 09/29/2014  . Dehydration 08/22/2014  . Hyperglycemia due to type 1 diabetes mellitus ([place]) 08/21/2014  . Type 1 diabetes mellitus with hyperglycemia ([place])   . Noncompliance   . Generalized abdominal pain   . Glycosuria   . Depression 07/12/2014  . Involuntary movements 06/13/2014  . Bilateral leg pain 06/13/2014  . 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 Suicidal ideations 03/31/2014  . ODD (oppositional defiant disorder) 03/03/2014  . MDD (major depressive disorder), recurrent episode, moderate ([place]) 02/16/2014  . Attention deficit hyperactivity disorder (ADHD), combined type, moderate 02/16/2014  . Microcytic anemia 02/15/2014  . Hyperglycemia 02/14/2014  . Goiter 02/04/2014  . 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[place])  02/04/2014  . Acquired acanthosis nigricans 02/04/2014  . Obesity, morbid ([place]) 02/04/2014  . Insulin resistance 02/04/2014  . Hyperinsulinemia 02/04/2014  . Hypoglycemia associated with diabetes ([place]) 02/02/2014  . Maladaptive health behaviors affecting medical condition 02/02/2014  . Hypoglycemia 02/02/2014  . Type 1 diabetes mellitus in patient 74 to 20 years of age with  hemoglobin A1c goal of less than 7.5% ([place]) 01/26/2014  . Partial epilepsy with impairment of consciousness ([place]) 12/13/2013  . 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 Migraine without aura 12/13/2013  . Body mass index, pediatric, greater than or equal to 95th percentile for age 42/16/2015  . Asthma 12/13/2013  . Hypoglycemia unawareness in type 1 diabetes mellitus ([place]) 08/08/2013  . Seizure disorder ([place]) 07/06/2013  . Short-term memory loss 07/06/2013  . Sickle cell trait ([place]) 07/06/2013  . Diabetes ([place]) 06/17/2013  . Diabetes mellitus, new onset ([place]) 06/17/2013    Past Surgical History:  Procedure Laterality Date  . CIRCUMCISION  2000  . FINGER SURGERY Left 2001   "crushed pinky; had to repair it" (06/17/2013)        Home Medications    Prior to Admission medications   Medication Sig Start Date End Date Taking? Authorizing Provider  albuterol (PROVENTIL HFA;VENTOLIN HFA) 108 (90 Base) MCG/ACT inhaler Inhale 1 puff into the lungs every 4 (four) hours as needed for wheezing or shortness of breath. 09/16/18   Ghimire, Henreitta Leber, MD  blood glucose meter kit and supplies Dispense based on patient and insurance preference. Use up to four times daily as directed. (FOR ICD-10 E10.9, E11.9). 09/16/18   Ghimire, Henreitta Leber, MD  FLUoxetine (PROZAC) 20 MG capsule Take 1 capsule (20 mg total) by mouth daily. 09/20/18   Patrecia Pour, NP  glucose blood (FREESTYLE TEST STRIPS) test strip Use as instructed 09/16/18   Ghimire, Henreitta Leber, MD  insulin aspart (NOVOLOG FLEXPEN) 100 UNIT/ML FlexPen Inject 0-15 Units into the skin 3 (three) times daily with meals. In addition to 8 units given if you eat >50% of a meal 09/16/18   Ghimire, Henreitta Leber, MD  Insulin Glargine (LANTUS) 100 UNIT/ML Solostar Pen Inject 20 Units into the skin 2 (two) times daily. 09/16/18   Ghimire, Henreitta Leber, MD  Insulin Pen Needle 32G X 8 MM MISC Use as directed 09/16/18   Ghimire, Henreitta Leber, MD  traZODone (DESYREL)  100 MG tablet Take 1 tablet (100 mg total) by mouth at bedtime as needed for sleep. 09/19/18   Patrecia Pour, NP    Family History Family History  Problem Relation Age of Onset  . Asthma Mother   . COPD Mother   . Other Mother        possible autoimmune, unclear  . Diabetes Maternal Grandmother   . Heart disease Maternal Grandmother   . Hypertension Maternal Grandmother   . Mental illness Maternal Grandmother   . Heart disease Maternal Grandfather   . Hyperlipidemia Paternal Grandmother   . Hyperlipidemia Paternal Grandfather   . Migraines Sister        Hemiplegic Migraines     Social History Social History   Tobacco Use  . Smoking status: Passive Smoke Exposure - Never Smoker  . Smokeless tobacco: Never Used  . Tobacco comment: Mom and dad smoke outside   Substance Use Topics  . Alcohol use: No    Alcohol/week: 0.0 standard drinks  . Drug use: No     Allergies   Bee venom; Fish allergy; Shellfish allergy; and Tea  Review of Systems Review of Systems Ten systems reviewed and are negative for acute change, except as noted in the HPI.    Physical Exam Updated Vital Signs BP (!) 142/88   Pulse 91   Temp 97.7 F (36.5 C) (Oral)   Resp 18   Ht '5\' 10"'  (1.778 m)   SpO2 100%   BMI 26.32 kg/m   Physical Exam Vitals signs and nursing note reviewed.  Constitutional:      General: He is not in acute distress.    Appearance: He is well-developed. He is not diaphoretic.     Comments: Multi-colored hair  HENT:     Head: Normocephalic and atraumatic.     Mouth/Throat:     Comments: Dry mm Eyes:     General: No scleral icterus.    Conjunctiva/sclera: Conjunctivae normal.  Neck:     Musculoskeletal: Normal range of motion.  Cardiovascular:     Rate and Rhythm: Normal rate and regular rhythm.     Pulses: Normal pulses.  Pulmonary:     Effort: Pulmonary effort is normal. No respiratory distress.     Breath sounds: No stridor. No wheezing.     Comments:  Respirations even and unlabored Musculoskeletal: Normal range of motion.  Skin:    General: Skin is warm and dry.     Coloration: Skin is not pale.     Findings: No erythema or rash.  Neurological:     Mental Status: He is alert and oriented to person, place, and time.  Psychiatric:        Mood and Affect: Mood is anxious.        Behavior: Behavior normal.      ED Treatments / Results  Labs (all labs ordered are listed, but only abnormal results are displayed) Labs Reviewed  BASIC METABOLIC PANEL - Abnormal; Notable for the following components:      Result Value   Sodium 122 (*)    Chloride 88 (*)    CO2 16 (*)    Glucose, Bld 845 (*)    Creatinine, Ser 1.57 (*)    Calcium 8.5 (*)    Anion gap 18 (*)    All other components within normal limits  URINALYSIS, ROUTINE W REFLEX MICROSCOPIC - Abnormal; Notable for the following components:   Color, Urine COLORLESS (*)    Glucose, UA >=500 (*)    Ketones, ur 80 (*)    All other components within normal limits  CBG MONITORING, ED - Abnormal; Notable for the following components:   Glucose-Capillary >600 (*)    All other components within normal limits  POCT I-STAT 7, (LYTES, BLD GAS, ICA,H+H) - Abnormal; Notable for the following components:   pH, Arterial 7.311 (*)    pCO2 arterial 28.4 (*)    Bicarbonate 14.3 (*)    TCO2 15 (*)    Acid-base deficit 10.0 (*)    Sodium 122 (*)    All other components within normal limits  CBC    EKG None  Radiology No results found.  Procedures .Critical Care Performed by: Antonietta Breach, PA-C Authorized by: Antonietta Breach, PA-C   Critical care provider statement:    Critical care time (minutes):  45   Critical care was time spent personally by me on the following activities:  Discussions with consultants, evaluation of patient's response to treatment, examination of patient, ordering and performing treatments and interventions, ordering and review of laboratory studies, ordering  and review of radiographic studies, pulse oximetry, re-evaluation  of patient's condition, obtaining history from patient or surrogate and review of old charts   (including critical care time)  Medications Ordered in ED Medications  insulin regular, human (MYXREDLIN) 100 units/ 100 mL infusion (has no administration in time range)  potassium chloride 10 mEq in 100 mL IVPB (has no administration in time range)  sodium chloride 0.9 % bolus 2,000 mL (2,000 mLs Intravenous New Bag/Given 09/24/18 0005)    1:16 AM Patient with a CBG of 845.  He has an anion gap of 18 with ketonuria.  Findings consistent with DKA which will require admission.  IV insulin ordered.  He is receiving IV fluids.   Initial Impression / Assessment and Plan / ED Course  I have reviewed the triage vital signs and the nursing notes.  Pertinent labs & imaging results that were available during my care of the patient were reviewed by me and considered in my medical decision making (see chart for details).        18 year old male requiring admission for management of DKA.  Hyperglycemia secondary to medication noncompliance.  The patient is also homeless.  He presents to the ED frequently in the setting of hyperglycemia and DKA.  He has a mild AKI and is receiving IV fluids.  IV insulin ordered.  Will admit to hospitalist service.   Final Clinical Impressions(s) / ED Diagnoses   Final diagnoses:  Type 1 diabetes mellitus with ketoacidosis without coma [facility])    ED Discharge Orders    None       Antonietta Breach, PA-C 09/24/18 0118    Antonietta Breach, PA-C 09/24/18 [place], Devol, MD 09/24/18 [phone]

## 2018-09-25 ENCOUNTER — Other Ambulatory Visit: Payer: Self-pay

## 2018-09-25 ENCOUNTER — Encounter (HOSPITAL_COMMUNITY): Payer: Self-pay

## 2018-09-25 ENCOUNTER — Observation Stay (HOSPITAL_COMMUNITY)
Admission: EM | Admit: 2018-09-25 | Discharge: 2018-09-26 | Discharge status: Against Medical Advice | Payer: Medicaid Other | Attending: Internal Medicine | Admitting: Internal Medicine

## 2018-09-25 DIAGNOSIS — D573 Sickle-cell trait: Secondary | ICD-10-CM | POA: Insufficient documentation

## 2018-09-25 DIAGNOSIS — Z7722 Contact with and (suspected) exposure to environmental tobacco smoke (acute) (chronic): Secondary | ICD-10-CM | POA: Insufficient documentation

## 2018-09-25 DIAGNOSIS — F603 Borderline personality disorder: Secondary | ICD-10-CM | POA: Insufficient documentation

## 2018-09-25 DIAGNOSIS — Z8249 Family history of ischemic heart disease and other diseases of the circulatory system: Secondary | ICD-10-CM | POA: Diagnosis not present

## 2018-09-25 DIAGNOSIS — G40909 Epilepsy, unspecified, not intractable, without status epilepticus: Secondary | ICD-10-CM | POA: Diagnosis not present

## 2018-09-25 DIAGNOSIS — N529 Male erectile dysfunction, unspecified: Secondary | ICD-10-CM | POA: Insufficient documentation

## 2018-09-25 DIAGNOSIS — Z590 Homelessness: Secondary | ICD-10-CM | POA: Diagnosis not present

## 2018-09-25 DIAGNOSIS — E878 Other disorders of electrolyte and fluid balance, not elsewhere classified: Secondary | ICD-10-CM | POA: Insufficient documentation

## 2018-09-25 DIAGNOSIS — E1010 Type 1 diabetes mellitus with ketoacidosis without coma: Secondary | ICD-10-CM | POA: Diagnosis present

## 2018-09-25 DIAGNOSIS — N179 Acute kidney failure, unspecified: Secondary | ICD-10-CM | POA: Insufficient documentation

## 2018-09-25 DIAGNOSIS — G43909 Migraine, unspecified, not intractable, without status migrainosus: Secondary | ICD-10-CM | POA: Insufficient documentation

## 2018-09-25 DIAGNOSIS — Z6826 Body mass index (BMI) 26.0-26.9, adult: Secondary | ICD-10-CM | POA: Diagnosis not present

## 2018-09-25 DIAGNOSIS — F902 Attention-deficit hyperactivity disorder, combined type: Secondary | ICD-10-CM | POA: Diagnosis not present

## 2018-09-25 DIAGNOSIS — Z9114 Patient's other noncompliance with medication regimen: Secondary | ICD-10-CM | POA: Diagnosis not present

## 2018-09-25 DIAGNOSIS — F913 Oppositional defiant disorder: Secondary | ICD-10-CM | POA: Diagnosis not present

## 2018-09-25 DIAGNOSIS — Z9119 Patient's noncompliance with other medical treatment and regimen: Secondary | ICD-10-CM | POA: Diagnosis not present

## 2018-09-25 DIAGNOSIS — F4325 Adjustment disorder with mixed disturbance of emotions and conduct: Secondary | ICD-10-CM | POA: Diagnosis not present

## 2018-09-25 DIAGNOSIS — Z79899 Other long term (current) drug therapy: Secondary | ICD-10-CM | POA: Insufficient documentation

## 2018-09-25 DIAGNOSIS — E871 Hypo-osmolality and hyponatremia: Secondary | ICD-10-CM | POA: Diagnosis not present

## 2018-09-25 DIAGNOSIS — E1110 Type 2 diabetes mellitus with ketoacidosis without coma: Secondary | ICD-10-CM | POA: Diagnosis present

## 2018-09-25 DIAGNOSIS — F329 Major depressive disorder, single episode, unspecified: Secondary | ICD-10-CM | POA: Diagnosis not present

## 2018-09-25 DIAGNOSIS — J45909 Unspecified asthma, uncomplicated: Secondary | ICD-10-CM | POA: Insufficient documentation

## 2018-09-25 DIAGNOSIS — Z794 Long term (current) use of insulin: Secondary | ICD-10-CM | POA: Diagnosis not present

## 2018-09-25 LAB — CBC WITH DIFFERENTIAL/PLATELET
Abs Immature Granulocytes: 0.22 10*3/uL — ABNORMAL HIGH (ref 0.00–0.07)
BASOS ABS: 0.1 10*3/uL (ref 0.0–0.1)
Basophils Relative: 1 %
EOS ABS: 0.1 10*3/uL (ref 0.0–0.5)
Eosinophils Relative: 1 %
HEMATOCRIT: 47.2 % (ref 39.0–52.0)
Hemoglobin: 15.7 g/dL (ref 13.0–17.0)
Immature Granulocytes: 2 %
Lymphocytes Relative: 7 %
Lymphs Abs: 0.8 10*3/uL (ref 0.7–4.0)
MCH: 28.4 pg (ref 26.0–34.0)
MCHC: 33.3 g/dL (ref 30.0–36.0)
MCV: 85.5 fL (ref 80.0–100.0)
Monocytes Absolute: 0.5 10*3/uL (ref 0.1–1.0)
Monocytes Relative: 5 %
NEUTROS PCT: 84 %
Neutro Abs: 9.8 10*3/uL — ABNORMAL HIGH (ref 1.7–7.7)
Platelets: 387 10*3/uL (ref 150–400)
RBC: 5.52 MIL/uL (ref 4.22–5.81)
RDW: 12.2 % (ref 11.5–15.5)
WBC: 11.5 10*3/uL — ABNORMAL HIGH (ref 4.0–10.5)
nRBC: 0 % (ref 0.0–0.2)

## 2018-09-25 LAB — GLUCOSE, CAPILLARY
Glucose-Capillary: 587 mg/dL (ref 70–99)
Glucose-Capillary: 306 mg/dL — ABNORMAL HIGH (ref 70–99)

## 2018-09-25 LAB — URINALYSIS, ROUTINE W REFLEX MICROSCOPIC
Bacteria, UA: NONE SEEN
Bilirubin Urine: NEGATIVE
Hgb urine dipstick: NEGATIVE
Ketones, ur: 80 mg/dL — AB
Leukocytes,Ua: NEGATIVE
Nitrite: NEGATIVE
PROTEIN: NEGATIVE mg/dL
Specific Gravity, Urine: 1.02 (ref 1.005–1.030)
pH: 5 (ref 5.0–8.0)

## 2018-09-25 LAB — BASIC METABOLIC PANEL
Anion gap: 22 — ABNORMAL HIGH (ref 5–15)
BUN: 13 mg/dL (ref 6–20)
CO2: 11 mmol/L — ABNORMAL LOW (ref 22–32)
Calcium: 8.6 mg/dL — ABNORMAL LOW (ref 8.9–10.3)
Chloride: 92 mmol/L — ABNORMAL LOW (ref 98–111)
Creatinine, Ser: 1.67 mg/dL — ABNORMAL HIGH (ref 0.61–1.24)
GFR calc Af Amer: >60 mL/min (ref >60)
GFR calc non Af Amer: 58 mL/min — ABNORMAL LOW (ref >60)
Glucose, Bld: 922 mg/dL (ref 70–99)
Potassium: 5.1 mmol/L (ref 3.5–5.1)
Sodium: 125 mmol/L — ABNORMAL LOW (ref 135–145)

## 2018-09-25 LAB — POCT I-STAT EG7
Acid-base deficit: 12 mmol/L — ABNORMAL HIGH (ref 0.0–2.0)
Bicarbonate: 11.5 mmol/L — ABNORMAL LOW (ref 20.0–28.0)
Calcium, Ion: 1.12 mmol/L — ABNORMAL LOW (ref 1.15–1.40)
HEMATOCRIT: 48 % (ref 39.0–52.0)
Hemoglobin: 16.3 g/dL (ref 13.0–17.0)
O2 SAT: 100 %
POTASSIUM: 5.1 mmol/L (ref 3.5–5.1)
SODIUM: 124 mmol/L — AB (ref 135–145)
TCO2: 12 mmol/L — ABNORMAL LOW (ref 22–32)
pCO2, Ven: 21.6 mmHg — ABNORMAL LOW (ref 44.0–60.0)
pH, Ven: 7.335 (ref 7.250–7.430)
pO2, Ven: 174 mmHg — ABNORMAL HIGH (ref 32.0–45.0)

## 2018-09-25 LAB — CBG MONITORING, ED
Glucose-Capillary: >600 mg/dL (ref 70–99)
Glucose-Capillary: >600 mg/dL (ref 70–99)
Glucose-Capillary: >600 mg/dL (ref 70–99)

## 2018-09-25 MED ORDER — ACETAMINOPHEN 325 MG PO TABS: 650.0000 mg | ORAL_TABLET | Freq: Four times a day (QID) | ORAL | Status: DC | PRN

## 2018-09-25 MED ORDER — ENOXAPARIN SODIUM 40 MG/0.4ML ~~LOC~~ SOLN: 40.0000 mg | SUBCUTANEOUS | Status: DC

## 2018-09-25 MED ORDER — SODIUM CHLORIDE 0.9 % IV BOLUS
1000.0000 mL | Freq: Once | INTRAVENOUS | Status: AC
  Administered 2018-09-25: 1000 mL via INTRAVENOUS

## 2018-09-25 MED ORDER — SODIUM CHLORIDE 0.9 % IV SOLN
INTRAVENOUS | Status: AC
  Administered 2018-09-25: 22:00:00 via INTRAVENOUS

## 2018-09-25 MED ORDER — ACETAMINOPHEN 650 MG RE SUPP: 650.0000 mg | Freq: Four times a day (QID) | RECTAL | Status: DC | PRN

## 2018-09-25 MED ORDER — DEXTROSE-NACL 5-0.45 % IV SOLN
INTRAVENOUS | Status: DC
  Administered 2018-09-26: via INTRAVENOUS

## 2018-09-25 MED ORDER — SENNOSIDES-DOCUSATE SODIUM 8.6-50 MG PO TABS: 1.0000 | ORAL_TABLET | Freq: Every evening | ORAL | Status: DC | PRN

## 2018-09-25 MED ORDER — INSULIN REGULAR(HUMAN) IN NACL 100-0.9 UT/100ML-% IV SOLN
INTRAVENOUS | Status: DC
  Administered 2018-09-25: 5.4 [IU]/h via INTRAVENOUS
  Filled 2018-09-25: qty 100

## 2018-09-25 MED ORDER — SODIUM CHLORIDE 0.9 % IV SOLN
INTRAVENOUS | Status: DC
  Administered 2018-09-25: 23:00:00 via INTRAVENOUS

## 2018-09-25 NOTE — ED Notes (Signed)
Pt refusing CT scan  

## 2018-09-25 NOTE — ED Notes (Signed)
ED TO INPATIENT HANDOFF REPORT  ED Nurse Name and Phone #: [phone]  S Name/Age/Gender [name] [name] 18 y.o. male Room/Bed: 029C/029C  Code Status   Code Status: Full Code  Home/SNF/Other Home Patient oriented to: self, place, time and situation Is this baseline? Yes   Triage Complete: Triage complete  Chief Complaint hypeglycemia  Triage Note Pt brought in with c/o hyperglycemia. Pt states he is "being forced to be seen" by his probation officer. Pt endorses nausea, no vomiting, denies diarrhea. Pt states he does not take his insulin or check his blood sugar.    Allergies Allergies  Allergen Reactions  . Bee Venom Anaphylaxis  . Fish Allergy Anaphylaxis  . Shellfish Allergy Anaphylaxis  . Tea Anaphylaxis    Level of Care/Admitting Diagnosis ED Disposition    ED Disposition Condition Comment   Admit  Hospital Area: [facility] [[id number]]  Level of Care: Progressive [102]  Diagnosis: DKA (diabetic ketoacidoses) (HCC) [300923]  Admitting Physician: Gust Rung [[id number]]  Attending Physician: HOFFMAN, ERIK C [[id number]]  PT Class (Do Not Modify): Observation [104]  PT Acc Code (Do Not Modify): Observation [10022]       B Medical/Surgery History Past Medical History:  Diagnosis Date  . ADD (attention deficit disorder)   . Allergy   . Anxiety   . Asthma   . Depression   . Epileptic seizure (H[facility])    "both petit and grand mal; last sz ~ 2 wk ago" (06/17/2013)  . Headache(784.0)    "2-3 times/wk usually" (06/17/2013)  . Heart murmur    "heard a slight one earlier today" (06/17/2013)  . Homeless   . Migraine    "maybe once/month; it's severe" (06/17/2013)  . ODD (oppositional defiant disorder)   . Sickle cell trait (HCC)   . Type I diabetes mellitus (HCC)    "that's what they're thinking now" (06/17/2013)  . Vision abnormalities    "takes him longer to focus cause; from his sz" (06/17/2013)   Past Surgical History:  Procedure  Laterality Date  . CIRCUMCISION  2000  . FINGER SURGERY Left 2001   "crushed pinky; had to repair it" (06/17/2013)     A IV Location/Drains/Wounds Patient Lines/Drains/Airways Status   Active Line/Drains/Airways    Name:   Placement date:   Placement time:   Site:   Days:   Peripheral IV 09/25/18 Right Forearm   09/25/18    1858    Forearm   less than 1   Wound / Incision (Open or Dehisced) 04/04/18 Non-pressure wound Foot Right   04/04/18    1900    Foot   174          Intake/Output Last 24 hours No intake or output data in the 24 hours ending 09/25/18 2046  Labs/Imaging Results for orders placed or performed during the hospital encounter of 09/25/18 (from the past 48 hour(s))  CBG monitoring, ED     Status: Abnormal   Collection Time: 09/25/18  6:34 PM  Result Value Ref Range   Glucose-Capillary >600 (HH) 70 - 99 mg/dL   Comment 1 Notify RN    Comment 2 Document in Chart   CBC with Differential     Status: Abnormal   Collection Time: 09/25/18  6:53 PM  Result Value Ref Range   WBC 11.5 (H) 4.0 - 10.5 K/uL   RBC 5.52 4.22 - 5.81 MIL/uL   Hemoglobin 15.7 13.0 - 17.0 g/dL   HCT 30.0 76.2 -  52.0 %   MCV 85.5 80.0 - 100.0 fL   MCH 28.4 26.0 - 34.0 pg   MCHC 33.3 30.0 - 36.0 g/dL   RDW 57.9 03.8 - 33.3 %   Platelets 387 150 - 400 K/uL   nRBC 0.0 0.0 - 0.2 %   Neutrophils Relative % 84 %   Neutro Abs 9.8 (H) 1.7 - 7.7 K/uL   Lymphocytes Relative 7 %   Lymphs Abs 0.8 0.7 - 4.0 K/uL   Monocytes Relative 5 %   Monocytes Absolute 0.5 0.1 - 1.0 K/uL   Eosinophils Relative 1 %   Eosinophils Absolute 0.1 0.0 - 0.5 K/uL   Basophils Relative 1 %   Basophils Absolute 0.1 0.0 - 0.1 K/uL   Immature Granulocytes 2 %   Abs Immature Granulocytes 0.22 (H) 0.00 - 0.07 K/uL    Comment: Performed at [facility], [address] [place], [place] [address]  Basic metabolic panel     Status: Abnormal   Collection Time: 09/25/18  6:53 PM  Result Value Ref Range   Sodium 125 (L)  135 - 145 mmol/L   Potassium 5.1 3.5 - 5.1 mmol/L   Chloride 92 (L) 98 - 111 mmol/L   CO2 11 (L) 22 - 32 mmol/L   Glucose, Bld 922 (HH) 70 - 99 mg/dL    Comment: CRITICAL RESULT CALLED TO, READ BACK BY AND VERIFIED WITH: KENNEDY, R RN @ 1927 ON 09/25/2018 BY TEMOCHE, H    BUN 13 6 - 20 mg/dL   Creatinine, Ser 9.16 (H) 0.61 - 1.24 mg/dL   Calcium 8.6 (L) 8.9 - 10.3 mg/dL   GFR calc non Af Amer 58 (L) >60 mL/min   GFR calc Af Amer >60 >60 mL/min   Anion gap 22 (H) 5 - 15    Comment: Performed at [facility], [address], [place], [place] [address]  POCT I-Stat EG7     Status: Abnormal   Collection Time: 09/25/18  7:07 PM  Result Value Ref Range   pH, Ven 7.335 7.250 - 7.430   pCO2, Ven 21.6 (L) 44.0 - 60.0 mmHg   pO2, Ven 174.0 (H) 32.0 - 45.0 mmHg   Bicarbonate 11.5 (L) 20.0 - 28.0 mmol/L   TCO2 12 (L) 22 - 32 mmol/L   O2 Saturation 100.0 %   Acid-base deficit 12.0 (H) 0.0 - 2.0 mmol/L   Sodium 124 (L) 135 - 145 mmol/L   Potassium 5.1 3.5 - 5.1 mmol/L   Calcium, Ion 1.12 (L) 1.15 - 1.40 mmol/L   HCT 48.0 39.0 - 52.0 %   Hemoglobin 16.3 13.0 - 17.0 g/dL   Patient temperature HIDE    Sample type VENOUS   CBG monitoring, ED     Status: Abnormal   Collection Time: 09/25/18  8:01 PM  Result Value Ref Range   Glucose-Capillary >600 (HH) 70 - 99 mg/dL   No results found.  Pending Labs Unresulted Labs (From admission, onward)    Start     Ordered   09/25/18 2300  Basic metabolic panel  Now then every 4 hours,   STAT     09/25/18 2035   09/25/18 1904  Blood gas, venous (at [facility] and [facility])  ONCE - STAT,   STAT     09/25/18 1903   09/25/18 1841  Urinalysis, Routine w reflex microscopic  Once,   R     09/25/18 1840  Vitals/Pain Today's Vitals   09/25/18 1915 09/25/18 1930 09/25/18 2000 09/25/18 2015  BP: 110/62 (!) 142/86 131/65 116/66  Pulse: (!) 101 (!) 101 98 100  Resp: (!) 22 (!) 23 (!) 23 (!) 23  Temp:      TempSrc:      SpO2: 95% 97% 94% 97%   Weight:      Height:      PainSc:        Isolation Precautions No active isolations  Medications Medications  insulin regular, human (MYXREDLIN) 100 units/ 100 mL infusion (5.4 Units/hr Intravenous New Bag/Given 09/25/18 2010)  enoxaparin (LOVENOX) injection 40 mg (has no administration in time range)  acetaminophen (TYLENOL) tablet 650 mg (has no administration in time range)    Or  acetaminophen (TYLENOL) suppository 650 mg (has no administration in time range)  senna-docusate (Senokot-S) tablet 1 tablet (has no administration in time range)  0.9 %  sodium chloride infusion (has no administration in time range)  dextrose 5 %-0.45 % sodium chloride infusion (has no administration in time range)  0.9 %  sodium chloride infusion (has no administration in time range)  sodium chloride 0.9 % bolus 1,000 mL (1,000 mLs Intravenous New Bag/Given 09/25/18 1929)    Mobility walks Low fall risk   Focused Assessments    R Recommendations: See Admitting Provider Note  Report given to:   Additional Notes:  Patient is known to leave AMA if he is not given lots of water. Has been cooperative thus far.

## 2018-09-25 NOTE — Significant Event (Signed)
Patient arrived from [facility] ED for diagnosis of DKA. Patient admitted to room 12 on LOC Progressive with continuous fluids of insulin and normal saline. CBG on arrival to unit was 587 and adjusted GlucoStabilizer accordingly. Patient's VS are as follows:     09/25/18 2230  Vitals  Temp  (Refuses temp and education)  BP 126/76  MAP (mmHg) 91  BP Location Left Arm  BP Method Automatic  Patient Position (if appropriate) Lying  Pulse Rate Source Monitor  ECG Heart Rate (!) 103  Resp 20  Oxygen Therapy  SpO2  (Refuses SpO2 monitoring and education)  O2 Device Room Air   On arrival, patient was demanding to be given food. Explained to patient what they were being admitted for and states "I don't fucking care. They told me that I was going to be able to eat food once I got up here." Patient's diet orders on arrival to unit were NPO and informed patient of this. Patient then states "I probably want to sign myself out then so I can go to [place]." Educated patient on risks and consequences of leaving with DKA untreated and patient was adamant to leaving AMA. Paged covering MD and MD came and talked to patient. After MD left, patient was noncompliant with answering admission questions, temperature check, and SpO2 monitoring.   Initiated bolus of normal saline and will initiate continuous normal saline.   Due to patient's history of seizures, initiated padding around the railings and turned on bed alarm.

## 2018-09-25 NOTE — ED Triage Notes (Signed)
Pt brought in with c/o hyperglycemia. Pt states he is "being forced to be seen" by his probation officer. Pt endorses nausea, no vomiting, denies diarrhea. Pt states he does not take his insulin or check his blood sugar.

## 2018-09-25 NOTE — ED Notes (Signed)
Pt stating he wants to leave if we do not give him water- states he should have never come here, states he would rather go to jail
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
## 2018-09-25 NOTE — H&P (Signed)
Date: 09/25/2018               Patient Name:  [name] [name]. MRN: [id number]  DOB: 06/26/2001 Age / Sex: 18 y.o., male   PCP: Patient, No Pcp Per         Medical Service: Internal Medicine Teaching Service         Attending Physician: Dr. Mikey Bussing, Marthenia Rolling, DO    First Contact: Dr. Gwyneth Revels Pager: [phone]  Second Contact: Dr. Antony Contras  Pager: [phone]       After Hours (After 5p/  First Contact Pager: [phone]  weekends / holidays): Second Contact Pager: [phone]   Chief Complaint: Hyperglycemia   History of Present Illness: [name] [name] is a 18 year old gentleman with type 1 diabetes mellitus, major depressive disorder, asthma, epilepsy, homelessness who presented to [facility] emergency department with abdominal pain.  While in the ED, he was found to be in DKA with significantly elevated glucose.  He reports of diffuse abdominal pain which he states is consistent with his previous episodes of DKA, polyuria (12-15 times a day), polydipsia.  He denies diarrhea, fever, chills, nausea, vomiting, dizziness, lightheadedness.  He does report of noncompliance to his home insulin as he states he cannot afford medications.  Per chart review, he was recently seen at emergency department on September 23, 2018 with DKA however he left AGAINST MEDICAL ADVICE when the plan for admission was discussed with him.   In regards to his epilepsy, he denies any recent episodes of seizure activities and states he is currently not on any antiepileptics.  ED course: Afebrile, tachycardic to 110, tachypneic to 20s, BP 140s/80s, SPO2 98% on room air.  BMP revealed hyperglycemia of 922, hyponatremia of 125, hypochloremia of 92, elevated anion gap of 22, bicarb of 11.  CBC showed mild leukocytosis of 11.5.  Meds:  Current Meds  Medication Sig  . albuterol (PROVENTIL HFA;VENTOLIN HFA) 108 (90 Base) MCG/ACT inhaler Inhale 1 puff into the lungs every 4 (four) hours as needed for wheezing or shortness of  breath.     Allergies: Allergies as of 09/25/2018 - Review Complete 09/25/2018  Allergen Reaction Noted  . Bee venom Anaphylaxis 11/23/2012  . Fish allergy Anaphylaxis 07/24/2017  . Shellfish allergy Anaphylaxis 07/24/2017  . Tea Anaphylaxis 08/18/2018   Past Medical History:  Diagnosis Date  . ADD (attention deficit disorder)   . Allergy   . Anxiety   . Asthma   . Depression   . Epileptic seizure ([facility])    "both petit and grand mal; last sz ~ 2 wk ago" (06/17/2013)  . Headache(784.0)    "2-3 times/wk usually" (06/17/2013)  . Heart murmur    "heard a slight one earlier today" (06/17/2013)  . Homeless   . Migraine    "maybe once/month; it's severe" (06/17/2013)  . ODD (oppositional defiant disorder)   . Sickle cell trait ([facility])   . Type I diabetes mellitus ([facility])    "that's what they're thinking now" (06/17/2013)  . Vision abnormalities    "takes him longer to focus cause; from his sz" (06/17/2013)    Family History: Denies any family history of diabetes, hypertension.  Social History: Denies tobacco use, EtOH use.  Prior use of methamphetamine however none recently.  He is from [place] and lives alone.  Review of Systems: A complete ROS was negative except as per HPI.   Physical Exam: Blood pressure 116/66, pulse 100, temperature 98.4 F (36.9 C), temperature source Oral,  resp. rate (!) 23, height 5\' 10"  (1.778 m), weight 83.2 kg, SpO2 97 %.  Physical Exam Vitals signs and nursing note reviewed.  Constitutional:      General: He is not in acute distress.    Appearance: He is not ill-appearing, toxic-appearing or diaphoretic.  HENT:     Head: Normocephalic and atraumatic.  Neck:     Musculoskeletal: Normal range of motion and neck supple.  Cardiovascular:     Rate and Rhythm: Tachycardia present.     Pulses: Normal pulses.     Heart sounds: Normal heart sounds. No murmur. No friction rub. No gallop.   Pulmonary:     Effort: Pulmonary effort is normal.      Breath sounds: Normal breath sounds. No wheezing, rhonchi or rales.  Abdominal:     General: Bowel sounds are normal. There is no distension.     Tenderness: There is abdominal tenderness (Moderate, Diffuse). There is no guarding or rebound.  Musculoskeletal:     Right lower leg: No edema.     Left lower leg: No edema.  Skin:    Comments: Multiple tattoos at the neck, upper extremities, wrists.  Neurological:     General: No focal deficit present.     Mental Status: He is alert.  Psychiatric:        Mood and Affect: Mood normal.        Behavior: Behavior normal.     EKG: personally reviewed my interpretation is pending  Assessment & Plan by Problem: Active Problems:   DKA (diabetic ketoacidoses) [facility])  [name] is a 18 year old gentleman with type 1 diabetes mellitus here for management of diabetic ketoacidosis.  Diabetic ketoacidosis: Known history of type 1 diabetes mellitus and presents with diffuse abdominal pain, polyuria and polydipsia.  Found to have an elevated non-anion gap metabolic acidosis with hypo-natremia and hypochloremia.  Recently presented to the emergency department 2 days ago and was found to be in DKA however he left AGAINST MEDICAL ADVICE.  He is noncompliant with insulin which is the most likely etiology for his frequent admissions for DKA.  He currently does not have any sign of systemic infection. - Continue IV fluids with normal saline - Continue insulin infusion - Start D5 half NS when CBG < 250 - CBG every hour - BMP every 4 hours until AG closes x2 - N.p.o. for now  Acute kidney injury: sCr 1.6 (baseline ~0.7).  Most likely prerenal in etiology given his recent polyuria. - Continue IV fluid infusion - Monitor BMP  FEN: Replace electrolytes as needed, n.p.o. for now VTE ppx: Subcutaneous Lovenox CODE STATUS: Full code  Dispo: Admit patient to Observation with expected length of stay less than 2 midnights.  Signed: Yvette Rack, MD  09/25/2018, 8:50 PM  Pager: [phone] IMTS PGY-1

## 2018-09-26 LAB — GLUCOSE, CAPILLARY
Glucose-Capillary: 176 mg/dL — ABNORMAL HIGH (ref 70–99)
Glucose-Capillary: 151 mg/dL — ABNORMAL HIGH (ref 70–99)

## 2018-09-26 NOTE — Significant Event (Signed)
AGAINST MEDICAL ADVISE NOTE  Patient told RN that he was going to leave. Educated patient on the risks and consequences of leaving AMA while still being treated for DKA and patient refused teaching. This RN told patient that he is visibly weak and again advised in trying to leave AMA. Patient still refused and proceeded to take IV out of arm. This RN placed a gauze over IV site and signed the [facility] paperwork with charge RN Lowella Bandy as a second witness. MD came to the floor and patient still refused to listen. Patient belongings were given back to patient and patient left the unit.

## 2018-09-26 NOTE — Progress Notes (Signed)
I received multiple pages from the nursing staff regarding [name] [name] refusing treatment and wanting to leave AMA. I went up to evaluate the patient the first time and it was quite evident that he was adamant to receiving medical treatment but after much conversation he decided to stay the first time.    Received a subsequent page from nursing staff around 1:40AM regarding patient wanting to leave AMA. When I arrived on the floor, [name] [name] had pulled his IV line and had put on his clothes. I never had the chance to talk to him as he walked past me in the hallway refusing to talk to any of [place]. I made the nursing staff aware to let security staff aware as he is currently on probation.   Yvette Rack, MD 09/26/2018, 1:55 AM Pager: [phone] IMTS PGY-1

## 2018-10-06 ENCOUNTER — Encounter (HOSPITAL_COMMUNITY): Payer: Self-pay

## 2018-10-06 ENCOUNTER — Ambulatory Visit: Payer: Medicaid Other | Admitting: Critical Care Medicine

## 2018-10-06 ENCOUNTER — Other Ambulatory Visit: Payer: Self-pay

## 2018-10-06 ENCOUNTER — Emergency Department (HOSPITAL_COMMUNITY): Payer: Medicaid Other

## 2018-10-06 ENCOUNTER — Inpatient Hospital Stay (HOSPITAL_COMMUNITY)
Admission: EM | Admit: 2018-10-06 | Discharge: 2018-10-06 | DRG: 639 | Discharge status: Against Medical Advice | Payer: Medicaid Other | Attending: Internal Medicine | Admitting: Internal Medicine

## 2018-10-06 DIAGNOSIS — F603 Borderline personality disorder: Secondary | ICD-10-CM | POA: Diagnosis present

## 2018-10-06 DIAGNOSIS — G40909 Epilepsy, unspecified, not intractable, without status epilepticus: Secondary | ICD-10-CM | POA: Diagnosis present

## 2018-10-06 DIAGNOSIS — Z9119 Patient's noncompliance with other medical treatment and regimen: Secondary | ICD-10-CM | POA: Diagnosis not present

## 2018-10-06 DIAGNOSIS — R7989 Other specified abnormal findings of blood chemistry: Secondary | ICD-10-CM

## 2018-10-06 DIAGNOSIS — Z5329 Procedure and treatment not carried out because of patient's decision for other reasons: Secondary | ICD-10-CM | POA: Diagnosis not present

## 2018-10-06 DIAGNOSIS — E1110 Type 2 diabetes mellitus with ketoacidosis without coma: Secondary | ICD-10-CM | POA: Diagnosis present

## 2018-10-06 DIAGNOSIS — D573 Sickle-cell trait: Secondary | ICD-10-CM | POA: Diagnosis present

## 2018-10-06 DIAGNOSIS — Z833 Family history of diabetes mellitus: Secondary | ICD-10-CM

## 2018-10-06 DIAGNOSIS — E872 Acidosis, unspecified: Secondary | ICD-10-CM

## 2018-10-06 DIAGNOSIS — F329 Major depressive disorder, single episode, unspecified: Secondary | ICD-10-CM | POA: Diagnosis present

## 2018-10-06 DIAGNOSIS — E1010 Type 1 diabetes mellitus with ketoacidosis without coma: Secondary | ICD-10-CM | POA: Diagnosis present

## 2018-10-06 DIAGNOSIS — Z91018 Allergy to other foods: Secondary | ICD-10-CM

## 2018-10-06 DIAGNOSIS — Z91030 Bee allergy status: Secondary | ICD-10-CM

## 2018-10-06 DIAGNOSIS — E162 Hypoglycemia, unspecified: Secondary | ICD-10-CM

## 2018-10-06 DIAGNOSIS — Z91013 Allergy to seafood: Secondary | ICD-10-CM | POA: Diagnosis not present

## 2018-10-06 DIAGNOSIS — Z794 Long term (current) use of insulin: Secondary | ICD-10-CM | POA: Diagnosis not present

## 2018-10-06 DIAGNOSIS — J45909 Unspecified asthma, uncomplicated: Secondary | ICD-10-CM | POA: Diagnosis present

## 2018-10-06 DIAGNOSIS — Z9114 Patient's other noncompliance with medication regimen: Secondary | ICD-10-CM

## 2018-10-06 DIAGNOSIS — Z79899 Other long term (current) drug therapy: Secondary | ICD-10-CM

## 2018-10-06 DIAGNOSIS — E10649 Type 1 diabetes mellitus with hypoglycemia without coma: Secondary | ICD-10-CM | POA: Diagnosis present

## 2018-10-06 DIAGNOSIS — R410 Disorientation, unspecified: Secondary | ICD-10-CM

## 2018-10-06 LAB — POCT I-STAT EG7
Acid-base deficit: 10 mmol/L — ABNORMAL HIGH (ref 0.0–2.0)
Bicarbonate: 13.8 mmol/L — ABNORMAL LOW (ref 20.0–28.0)
Calcium, Ion: 1.09 mmol/L — ABNORMAL LOW (ref 1.15–1.40)
HCT: 45 % (ref 39.0–52.0)
Hemoglobin: 15.3 g/dL (ref 13.0–17.0)
O2 Saturation: 100 %
Potassium: 3.4 mmol/L — ABNORMAL LOW (ref 3.5–5.1)
Sodium: 135 mmol/L (ref 135–145)
TCO2: 15 mmol/L — ABNORMAL LOW (ref 22–32)
pCO2, Ven: 25.4 mmHg — ABNORMAL LOW (ref 44.0–60.0)
pH, Ven: 7.343 (ref 7.250–7.430)
pO2, Ven: 192 mmHg — ABNORMAL HIGH (ref 32.0–45.0)

## 2018-10-06 LAB — COMPREHENSIVE METABOLIC PANEL
ALT: 17 U/L (ref 0–44)
AST: 15 U/L (ref 15–41)
Albumin: 4.3 g/dL (ref 3.5–5.0)
Alkaline Phosphatase: 116 U/L (ref 38–126)
Anion gap: 21 — ABNORMAL HIGH (ref 5–15)
BUN: 11 mg/dL (ref 6–20)
CO2: 12 mmol/L — ABNORMAL LOW (ref 22–32)
Calcium: 9.5 mg/dL (ref 8.9–10.3)
Chloride: 102 mmol/L (ref 98–111)
Creatinine, Ser: 1.72 mg/dL — ABNORMAL HIGH (ref 0.61–1.24)
GFR calc Af Amer: >60 mL/min (ref >60)
GFR calc non Af Amer: 56 mL/min — ABNORMAL LOW (ref >60)
Glucose, Bld: 73 mg/dL (ref 70–99)
Potassium: 3.5 mmol/L (ref 3.5–5.1)
Sodium: 135 mmol/L (ref 135–145)
Total Bilirubin: 1.8 mg/dL — ABNORMAL HIGH (ref 0.3–1.2)
Total Protein: 7.6 g/dL (ref 6.5–8.1)

## 2018-10-06 LAB — CBC WITH DIFFERENTIAL/PLATELET
Abs Immature Granulocytes: 0.21 10*3/uL — ABNORMAL HIGH (ref 0.00–0.07)
Basophils Absolute: 0.1 10*3/uL (ref 0.0–0.1)
Basophils Relative: 1 %
Eosinophils Absolute: 0.1 10*3/uL (ref 0.0–0.5)
Eosinophils Relative: 1 %
HCT: 51.2 % (ref 39.0–52.0)
Hemoglobin: 16.9 g/dL (ref 13.0–17.0)
Immature Granulocytes: 2 %
Lymphocytes Relative: 28 %
Lymphs Abs: 2.9 10*3/uL (ref 0.7–4.0)
MCH: 28.2 pg (ref 26.0–34.0)
MCHC: 33 g/dL (ref 30.0–36.0)
MCV: 85.5 fL (ref 80.0–100.0)
Monocytes Absolute: 0.8 10*3/uL (ref 0.1–1.0)
Monocytes Relative: 8 %
Neutro Abs: 6.3 10*3/uL (ref 1.7–7.7)
Neutrophils Relative %: 60 %
Platelets: 404 10*3/uL — ABNORMAL HIGH (ref 150–400)
RBC: 5.99 MIL/uL — ABNORMAL HIGH (ref 4.22–5.81)
RDW: 12.2 % (ref 11.5–15.5)
WBC: 10.3 10*3/uL (ref 4.0–10.5)
nRBC: 0 % (ref 0.0–0.2)

## 2018-10-06 LAB — ACETAMINOPHEN LEVEL: Acetaminophen (Tylenol), Serum: <10 ug/mL — ABNORMAL LOW (ref 10–30)

## 2018-10-06 LAB — CBG MONITORING, ED
Glucose-Capillary: 83 mg/dL (ref 70–99)
Glucose-Capillary: 53 mg/dL — ABNORMAL LOW (ref 70–99)
Glucose-Capillary: 139 mg/dL — ABNORMAL HIGH (ref 70–99)
Glucose-Capillary: 114 mg/dL — ABNORMAL HIGH (ref 70–99)

## 2018-10-06 LAB — LACTIC ACID, PLASMA: Lactic Acid, Venous: 1.2 mmol/L (ref 0.5–1.9)

## 2018-10-06 LAB — SALICYLATE LEVEL: Salicylate Lvl: <7 mg/dL (ref 2.8–30.0)

## 2018-10-06 LAB — ETHANOL: Alcohol, Ethyl (B): <10 mg/dL (ref <10)

## 2018-10-06 LAB — BETA-HYDROXYBUTYRIC ACID: Beta-Hydroxybutyric Acid: 5.94 mmol/L — ABNORMAL HIGH (ref 0.05–0.27)

## 2018-10-06 LAB — OSMOLALITY: Osmolality: 288 mOsm/kg (ref 275–295)

## 2018-10-06 LAB — LIPASE, BLOOD: Lipase: 21 U/L (ref 11–51)

## 2018-10-06 MED ORDER — DEXTROSE-NACL 5-0.45 % IV SOLN
INTRAVENOUS | Status: DC
  Administered 2018-10-06: 19:00:00 via INTRAVENOUS

## 2018-10-06 MED ORDER — SODIUM CHLORIDE 0.9 % IV SOLN: INTRAVENOUS | Status: DC

## 2018-10-06 MED ORDER — ONDANSETRON HCL 4 MG/2ML IJ SOLN
4.0000 mg | Freq: Once | INTRAMUSCULAR | Status: AC
  Administered 2018-10-06: 4 mg via INTRAVENOUS
  Filled 2018-10-06: qty 2

## 2018-10-06 MED ORDER — DEXTROSE 50 % IV SOLN
1.0000 | Freq: Once | INTRAVENOUS | Status: AC
  Administered 2018-10-06: 50 mL via INTRAVENOUS
  Filled 2018-10-06: qty 50

## 2018-10-06 MED ORDER — DEXTROSE 10 % IV SOLN
INTRAVENOUS | Status: DC
  Administered 2018-10-06: 17:00:00 via INTRAVENOUS

## 2018-10-06 MED ORDER — INSULIN REGULAR(HUMAN) IN NACL 100-0.9 UT/100ML-% IV SOLN: INTRAVENOUS | Status: DC

## 2018-10-06 MED ORDER — POTASSIUM CHLORIDE 10 MEQ/100ML IV SOLN: 10.0000 meq | INTRAVENOUS | Status: DC

## 2018-10-06 MED ORDER — POTASSIUM CHLORIDE 10 MEQ/100ML IV SOLN
10.0000 meq | INTRAVENOUS | Status: DC
  Administered 2018-10-06: 10 meq via INTRAVENOUS
  Filled 2018-10-06: qty 100

## 2018-10-06 MED ORDER — INSULIN REGULAR(HUMAN) IN NACL 100-0.9 UT/100ML-% IV SOLN
INTRAVENOUS | Status: DC
  Administered 2018-10-06: 18:00:00 0.8 [IU]/h via INTRAVENOUS
  Filled 2018-10-06: qty 100

## 2018-10-06 MED ORDER — ENOXAPARIN SODIUM 40 MG/0.4ML ~~LOC~~ SOLN: 40.0000 mg | SUBCUTANEOUS | Status: DC

## 2018-10-06 MED ORDER — SODIUM CHLORIDE 0.9 % IV BOLUS
1000.0000 mL | Freq: Once | INTRAVENOUS | Status: AC
  Administered 2018-10-06: 1000 mL via INTRAVENOUS

## 2018-10-06 NOTE — ED Provider Notes (Signed)
[facility] EMERGENCY DEPARTMENT Provider Note   CSN: [id number] Arrival date & time: 10/06/18  1454    History   Chief Complaint Chief Complaint  Patient presents with   Fatigue   Abdominal Pain    HPI [name] [name]. is a 18 y.o. male presenting for lethargy, abdominal pain, nausea vomiting.  Level 5 caveat, patient does not verbally respond to any of my questions.  Per triage note, patient coming from home her mom reported lethargy x1 day and abdominal pain with nausea vomiting x2 days.  Patient told RN that he was having stomach and chest pain.  Per triage note, mom told EMS that patient use meth for the first time yesterday.  Additional history obtained from chart review.  Patient with a history of type 1 diabetes, seizure disorder, asthma, MDD, ODD, noncompliance, homelessness, borderline personality disorder. Pt seen multiple times in the past month for DKA.     HPI  Past Medical History:  Diagnosis Date   ADD (attention deficit disorder)    Allergy    Anxiety    Asthma    Depression    Epileptic seizure ([place])    "both petit and grand mal; last sz ~ 2 wk ago" (06/17/2013)   RKYHCWCB(762.8)    "2-3 times/wk usually" (06/17/2013)   Heart murmur    "heard a slight one earlier today" (06/17/2013)   Homeless    Migraine    "maybe once/month; it's severe" (06/17/2013)   ODD (oppositional defiant disorder)    Sickle cell trait ([place])    Type I diabetes mellitus ([place])    "that's what they're thinking now" (06/17/2013)   Vision abnormalities    "takes him longer to focus cause; from his sz" (06/17/2013)    Patient Active Problem List   Diagnosis Date Noted   AKI (acute kidney injury) ([place]) 09/15/2018   URI (upper respiratory infection) 09/15/2018   Adjustment disorder with mixed disturbance of emotions and conduct    Atypical chest pain 08/19/2018   Borderline personality disorder ([place]) 02/26/2018   MDD (major  depressive disorder), recurrent, severe, with psychosis ([place]) 02/25/2018   Major depressive disorder, recurrent severe without psychotic features ([place]) 07/24/2017   DKA, type 1 ([place]) 07/16/2017   Nausea & vomiting 07/16/2017   Uncontrolled type 1 diabetes circulatory disorder erectile dysfunction ([place])    DKA (diabetic ketoacidoses) ([place]) 12/18/2016   History of seizures 12/01/2016   History of migraine 12/01/2016   Disordered eating 03/08/2015   Ketonuria    Adjustment reaction to medical therapy    Non compliance w medication regimen    Diabetic peripheral neuropathy associated with type 1 diabetes mellitus ([place]) 09/29/2014   Dehydration 08/22/2014   Hyperglycemia due to type 1 diabetes mellitus ([place]) 08/21/2014   Type 1 diabetes mellitus with hyperglycemia ([place])    Noncompliance    Generalized abdominal pain    Glycosuria    Depression 07/12/2014   Involuntary movements 06/13/2014   Bilateral leg pain 06/13/2014   Somatic symptom disorder, persistent, moderate 04/07/2014   Sleepwalking disorder 04/07/2014   Suicidal ideations 03/31/2014   ODD (oppositional defiant disorder) 03/03/2014   MDD (major depressive disorder), recurrent episode, moderate ([name]) 02/16/2014   Attention deficit hyperactivity disorder (ADHD), combined type, moderate 02/16/2014   Microcytic anemia 02/15/2014   Hyperglycemia 02/14/2014   Goiter 02/04/2014   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[place]) 02/04/2014   Acquired acanthosis nigricans 02/04/2014   Obesity, morbid ([place]) 02/04/2014   Insulin  resistance 02/04/2014   Hyperinsulinemia 02/04/2014   Hypoglycemia associated with diabetes ([place]) 02/02/2014   Maladaptive health behaviors affecting medical condition 02/02/2014   Hypoglycemia 02/02/2014   Type 1 diabetes mellitus in patient 4 to 20 years of age with hemoglobin A1c goal of less than 7.5% ([place]) 01/26/2014   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   Migraine without aura 12/13/2013   Body mass index, pediatric, greater than or equal to 95th percentile for age 12/13/2013   Asthma 12/13/2013   Hypoglycemia unawareness in type 1 diabetes mellitus ([place]) 08/08/2013   Seizure disorder ([place]) 07/06/2013   Short-term memory loss 07/06/2013   Sickle cell trait ([place]) 07/06/2013   Diabetes ([place]) 06/17/2013   Diabetes mellitus, new onset ([place]) 06/17/2013    Past Surgical History:  Procedure Laterality Date   CIRCUMCISION  2000   FINGER SURGERY Left 2001   "crushed pinky; had to repair it" (06/17/2013)        Home Medications    Prior to Admission medications   Medication Sig Start Date End Date Taking? Authorizing Provider  albuterol (PROVENTIL HFA;VENTOLIN HFA) 108 (90 Base) MCG/ACT inhaler Inhale 1 puff into the lungs every 4 (four) hours as needed for wheezing or shortness of breath. 09/16/18   Ghimire, Henreitta Leber, MD  blood glucose meter kit and supplies Dispense based on patient and insurance preference. Use up to four times daily as directed. (FOR ICD-10 E10.9, E11.9). 09/16/18   Ghimire, Henreitta Leber, MD  FLUoxetine (PROZAC) 20 MG capsule Take 1 capsule (20 mg total) by mouth daily. Patient not taking: Reported on 09/25/2018 09/20/18   Patrecia Pour, NP  glucose blood (FREESTYLE TEST STRIPS) test strip Use as instructed 09/16/18   Jonetta Osgood, MD  hydrOXYzine (ATARAX/VISTARIL) 50 MG tablet  10/05/18   [provider]  insulin aspart (NOVOLOG FLEXPEN) 100 UNIT/ML FlexPen Inject 0-15 Units into the skin 3 (three) times daily with meals. In addition to 8 units given if you eat >50% of a meal Patient not taking: Reported on 09/25/2018 09/16/18   Jonetta Osgood, MD  Insulin Glargine (LANTUS) 100 UNIT/ML Solostar Pen Inject 20 Units into the skin 2 (two) times daily. Patient not taking: Reported on 09/25/2018 09/16/18    Jonetta Osgood, MD  Insulin Pen Needle 32G X 8 MM MISC Use as directed 09/16/18   Jonetta Osgood, MD  traZODone (DESYREL) 100 MG tablet Take 1 tablet (100 mg total) by mouth at bedtime as needed for sleep. Patient not taking: Reported on 09/25/2018 09/19/18   Patrecia Pour, NP    Family History Family History  Problem Relation Age of Onset   Asthma Mother    COPD Mother    Other Mother        possible autoimmune, unclear   Diabetes Maternal Grandmother    Heart disease Maternal Grandmother    Hypertension Maternal Grandmother    Mental illness Maternal Grandmother    Heart disease Maternal Grandfather    Hyperlipidemia Paternal Grandmother    Hyperlipidemia Paternal Grandfather    Migraines Sister        Hemiplegic Migraines     Social History Social History   Tobacco Use   Smoking status: Passive Smoke Exposure - Never Smoker   Smokeless tobacco: Never Used   Tobacco comment: Mom and dad smoke outside   Substance Use Topics   Alcohol use: No    Alcohol/week: 0.0 standard  drinks   Drug use: No     Allergies   Bee venom; Fish allergy; Shellfish allergy; and Tea   Review of Systems Review of Systems  Unable to perform ROS: Patient unresponsive     Physical Exam Updated Vital Signs BP 110/65    Pulse 80    Temp 97.9 F (36.6 C) (Oral)    Resp 16    SpO2 98%   Physical Exam Vitals signs and nursing note reviewed.  Constitutional:      General: He is not in acute distress.    Appearance: He is well-developed.     Comments: Appears dehydrated  HENT:     Head: Normocephalic and atraumatic.     Comments: MM dry Eyes:     Extraocular Movements: Extraocular movements intact.     Conjunctiva/sclera: Conjunctivae normal.     Pupils: Pupils are equal, round, and reactive to light.     Comments: EOMI and PERRLA  Neck:     Musculoskeletal: Normal range of motion and neck supple.  Cardiovascular:     Rate and Rhythm: Normal rate and regular  rhythm.     Pulses: Normal pulses.  Pulmonary:     Effort: Pulmonary effort is normal. No respiratory distress.     Breath sounds: Normal breath sounds. No wheezing.  Abdominal:     General: There is no distension.     Palpations: Abdomen is soft.     Tenderness: There is abdominal tenderness.     Comments: ttp of generalized abd, worse in upper quadrants and epigastric abd. No rigidity or distention  Musculoskeletal: Normal range of motion.  Skin:    General: Skin is warm and dry.     Capillary Refill: Capillary refill takes less than 2 seconds.     Comments: Good cap refill and pedal pulses  Neurological:     Comments: Pt will not speak to me, but will follow basic commands. Pt spoke to RN prior to my arrival      ED Treatments / Results  Labs (all labs ordered are listed, but only abnormal results are displayed) Labs Reviewed  CBC WITH DIFFERENTIAL/PLATELET - Abnormal; Notable for the following components:      Result Value   RBC 5.99 (*)    Platelets 404 (*)    Abs Immature Granulocytes 0.21 (*)    All other components within normal limits  COMPREHENSIVE METABOLIC PANEL - Abnormal; Notable for the following components:   CO2 12 (*)    Creatinine, Ser 1.72 (*)    Total Bilirubin 1.8 (*)    GFR calc non Af Amer 56 (*)    Anion gap 21 (*)    All other components within normal limits  ACETAMINOPHEN LEVEL - Abnormal; Notable for the following components:   Acetaminophen (Tylenol), Serum <10 (*)    All other components within normal limits  BETA-HYDROXYBUTYRIC ACID - Abnormal; Notable for the following components:   Beta-Hydroxybutyric Acid 5.94 (*)    All other components within normal limits  CBG MONITORING, ED - Abnormal; Notable for the following components:   Glucose-Capillary 53 (*)    All other components within normal limits  POCT I-STAT EG7 - Abnormal; Notable for the following components:   pCO2, Ven 25.4 (*)    pO2, Ven 192.0 (*)    Bicarbonate 13.8 (*)     TCO2 15 (*)    Acid-base deficit 10.0 (*)    Potassium 3.4 (*)    Calcium, Ion 1.09 (*)  All other components within normal limits  CBG MONITORING, ED - Abnormal; Notable for the following components:   Glucose-Capillary 139 (*)    All other components within normal limits  LIPASE, BLOOD  SALICYLATE LEVEL  ETHANOL  LACTIC ACID, PLASMA  OSMOLALITY  RAPID URINE DRUG SCREEN, HOSP PERFORMED  URINALYSIS, ROUTINE W REFLEX MICROSCOPIC  LACTIC ACID, PLASMA  CBG MONITORING, ED  I-STAT VENOUS BLOOD GAS, ED    EKG EKG Interpretation  Date/Time:  Wednesday October 06 2018 15:01:26 EDT Ventricular Rate:  82 PR Interval:    QRS Duration: 91 QT Interval:  349 QTC Calculation: 408 R Axis:   87 Text Interpretation:  Sinus rhythm Repol abnrm suggests ischemia, inferior leads Borderline ST elevation, anterolateral leads No significant change since last tracing Confirmed by Gareth Morgan [phone]) on 10/06/2018 4:02:29 PM   Radiology Ct Head Wo Contrast  Result Date: 10/06/2018 CLINICAL DATA:  Lethargy for 1 day. Altered level of consciousness, unexplained. History of type 1 diabetes, seizure disorder, sickle cell trait. EXAM: CT HEAD WITHOUT CONTRAST TECHNIQUE: Contiguous axial images were obtained from the base of the skull through the vertex without intravenous contrast. COMPARISON:  05/03/2010. FINDINGS: Brain: No evidence of acute infarction, hemorrhage, hydrocephalus, extra-axial collection or mass lesion/mass effect. Normal cerebral volume. No white matter disease. Vascular: No hyperdense vessel or unexpected calcification. Skull: Normal. Negative for fracture or focal lesion. Sinuses/Orbits: No acute finding. Other: None. IMPRESSION: Negative exam.  No change from priors. Electronically Signed   By: Staci Righter M.D.   On: 10/06/2018 16:07    Procedures .Critical Care Performed by: Franchot Heidelberg, PA-C Authorized by: Franchot Heidelberg, PA-C   Critical care provider statement:     Critical care time (minutes):  45   Critical care time was exclusive of:  Separately billable procedures and treating other patients and teaching time   Critical care was necessary to treat or prevent imminent or life-threatening deterioration of the following conditions:  Metabolic crisis   Critical care was time spent personally by me on the following activities:  Blood draw for specimens, development of treatment plan with patient or surrogate, evaluation of patient's response to treatment, examination of patient, obtaining history from patient or surrogate, ordering and performing treatments and interventions, ordering and review of laboratory studies, ordering and review of radiographic studies, pulse oximetry, re-evaluation of patient's condition and review of old charts   I assumed direction of critical care for this patient from another provider in my specialty: no   Comments:     Pt in DKA, insulin gtt ordered and pt admitted   (including critical care time)  Medications Ordered in ED Medications  dextrose 10 % infusion ( Intravenous New Bag/Given 10/06/18 1633)  insulin regular, human (MYXREDLIN) 100 units/ 100 mL infusion (has no administration in time range)  potassium chloride 10 mEq in 100 mL IVPB (has no administration in time range)  sodium chloride 0.9 % bolus 1,000 mL (1,000 mLs Intravenous New Bag/Given 10/06/18 1524)  ondansetron (ZOFRAN) injection 4 mg (4 mg Intravenous Given 10/06/18 1524)  dextrose 50 % solution 50 mL (50 mLs Intravenous Given 10/06/18 1630)     Initial Impression / Assessment and Plan / ED Course  I have reviewed the triage vital signs and the nursing notes.  Pertinent labs & imaging results that were available during my care of the patient were reviewed by me and considered in my medical decision making (see chart for details).        Patient  presenting for evaluation of altered mental status, nausea, vomiting, domino pain.  Physical exam shows patient  appears dehydrated, but in no acute distress.  Patient with tenderness palpation of epigastric and upper quadrant abdomen, although per chart review this does not appear new for him.  Patient with frequent history of DKA, often presenting with abdominal pain.  As such, will order labs and urine for further evaluation.  Also consider pancreatitis, gastritis, PUD.  Zofran and fluids given for symptom control.   Ct head negative. EKG unchanged from previous. Doubt ACS as cause. No reported or sz-like activity noted, doubt sz or post-ictal state.   Labs show metabolic acidosis with bicarb of 12 and gap of 21.  Patient's blood sugar is normal at 73, however he did take insulin just prior to arrival.  Consider patient self corrected his blood sugar without takes in the gap.  Also consider other causes for metabolic acidosis such as methanol ingestion, Tylenol ingestion, infection.  Will add on salicylate, ethanol, lactic, acetaminophen level, VBG, beta-hydroxybutyric acid.   informed by RN that cbg decreased to 53, will give D50 and hand d10 gtt.   Add on labs reassuring.  Beta hydroxybutyric acid elevated.  Urine pending.  Patient refusing cath.  Per RN intact, patient is more alert and verbal at this time, although he still refuses to speak to me. Cased discussed with attending, Dr. Billy Fischer evaluated the pt. Pt spoke with attending, and he denied SI or ingestion.  CBG improved to 139. At this time, I still believe patient has DKA considering his history and presentation.  As such, will hang insulin drip with continued D10. Potassium started for K of 3.5. Will call for admission.   Discussed with Internal Medicine, pt to be admitted.    Final Clinical Impressions(s) / ED Diagnoses   Final diagnoses:  Metabolic acidosis  Disorientation  Hypoglycemia    ED Discharge Orders    None       Franchot Heidelberg, PA-C 10/06/18 1751    Gareth Morgan, MD 10/09/18 2135

## 2018-10-06 NOTE — ED Notes (Signed)
Admitting provider at bedside.

## 2018-10-06 NOTE — ED Notes (Signed)
Pt notified of bed assignment, pt refusing stating that this RN needs to page the doctor because he wants to leave AMA. RN paged admitting, MD will continue to monitor.

## 2018-10-06 NOTE — Progress Notes (Deleted)
Patient ID: [name] [name]., male   DOB: August 06, 2000, 18 y.o.   MRN: [id number] Virtual Visit via Telephone Note  I connected with [name] [name]. on 10/06/18 at 10:30 AM EDT by telephone and verified that I am speaking with the correct person using two identifiers.   I discussed the limitations, risks, security and privacy concerns of performing an evaluation and management service by telephone and the availability of in person appointments. I also discussed with the patient that there may be a patient responsible charge related to this service. The patient expressed understanding and agreed to proceed.   History of Present Illness: 18 yo Latino Male Type 1 DM recent 3/28 adm for DKA , left AMA after <24hrs  Prev adm twice in Feb, and 3/1, 3/3, 3/18 for DKA.  Also mult ED visits for hyperglycemia and depression, suicidal ideation    Observations/Objective:   Assessment and Plan:   Follow Up Instructions:    I discussed the assessment and treatment plan with the patient. The patient was provided an opportunity to ask questions and all were answered. The patient agreed with the plan and demonstrated an understanding of the instructions.   The patient was advised to call back or seek an in-person evaluation if the symptoms worsen or if the condition fails to improve as anticipated.  I provided *** minutes of non-face-to-face time during this encounter.   Shan Levans, MD

## 2018-10-06 NOTE — ED Notes (Signed)
RN attempted to start second IV to have additional access due to multiple continuous infusions. Pt refused saying "fuck that no you ain't imma just leave then." Primary RN advised pt that it would not be medically safe for him to leave at this time Pt continuing to refuse second IV. Will continue to monitor.

## 2018-10-06 NOTE — Discharge Summary (Addendum)
Name: [name] [name]. MRN: [id number] DOB: [birth year] 18 y.o. PCP: Patient, No Pcp Per  Date of Admission: 10/06/2018  2:54 PM Date of Discharge: 10/06/2018 Attending Physician: Dr. Dareen Piano  Discharge Diagnosis: 1. Euglycemic diabetic ketoacidosis 2.  Major depressive disorder 3.  Asthma 4.  Seizure disorder 5.  Borderline personality disorder 6.  Noncompliance   Discharge Medications: Allergies as of 10/06/2018      Reactions   Bee Venom Anaphylaxis   Fish Allergy Anaphylaxis   Shellfish Allergy Anaphylaxis   Tea Anaphylaxis      Medication List    ASK your doctor about these medications   albuterol 108 (90 Base) MCG/ACT inhaler Commonly known as:  PROVENTIL HFA;VENTOLIN HFA Inhale 1 puff into the lungs every 4 (four) hours as needed for wheezing or shortness of breath.   blood glucose meter kit and supplies Dispense based on patient and insurance preference. Use up to four times daily as directed. (FOR ICD-10 E10.9, E11.9).   FLUoxetine 20 MG capsule Commonly known as:  PROZAC Take 1 capsule (20 mg total) by mouth daily.   glucose blood test strip Commonly known as:  FREESTYLE TEST STRIPS Use as instructed   hydrOXYzine 50 MG tablet Commonly known as:  ATARAX/VISTARIL   insulin aspart 100 UNIT/ML FlexPen Commonly known as:  NovoLOG FlexPen Inject 0-15 Units into the skin 3 (three) times daily with meals. In addition to 8 units given if you eat >50% of a meal   Insulin Glargine 100 UNIT/ML Solostar Pen Commonly known as:  LANTUS Inject 20 Units into the skin 2 (two) times daily.   Insulin Pen Needle 32G X 8 MM Misc Use as directed   traZODone 100 MG tablet Commonly known as:  DESYREL Take 1 tablet (100 mg total) by mouth at bedtime as needed for sleep.       Disposition and follow-up:   JosephRead Rafik [name]. was discharged from [facility] in Serious condition.  At the hospital follow up visit please address:  1.  Euglycemic  diabetic ketoacidosis: Ensure compliance with insulin  2.  Labs / imaging needed at time of follow-up: BMP  3.  Pending labs/ test needing follow-up: None  Follow-up Appointments:   Hospital Course by problem list: 1.  Euglycemic diabetic ketoacidosis: [name] [name]. is a 18 year old gentleman with T1DM w/ frequent admissions for diabetic ketoacidosis, depressive disorder, asthma, seizure disorder, borderline personality disorder who presented to [name] on October 06, 2018 with 2 to 3-day history of nausea, diffuse abdominal pain and lethargy.  On arrival to the ED he was noted to be hypoglycemic with CBG of 83 and repeat of 53.  This is due to the fact that he had taking insulin a couple hours before presenting to the emergency [name].  CMP revealed increased anion gap metabolic acidosis with unremarkable glucose, beta hydroxybutyrate was elevated at 5.9, salicylate, Tylenol, ethanol, lactic acid, serum osmolality were all unremarkable.  His ABG was consistent with metabolic acidosis with results as follow 7.34/20 4/192.  In the emergency [name], he was started on IV fluid resuscitation with normal saline, D5 half NS and insulin infusion. His ongoing euglycemic DKA is most likely due to longstanding history of medication nonadherence but however administering insulin right before presentation.  He is not on SGLT2 inhibitor which is also known to cause euglycemic DKA.  While I was putting my admission orders, and kept getting multiple pages from the nurse regarding patient wanting  to leave [facility].  We made several attempts to convince him to stay however this was unsuccessful.  Discharge Vitals:   BP 110/70   Pulse 84   Temp 97.9 F (36.6 C) (Oral)   Resp 16   SpO2 98%   Pertinent Labs, Studies, and Procedures:  CMP Latest Ref Rng & Units 10/06/2018 10/06/2018 09/25/2018  Glucose 70 - 99 mg/dL - 73 -  BUN 6 - 20 mg/dL - 11 -  Creatinine 0.61 - 1.24  mg/dL - 1.72(H) -  Sodium 135 - 145 mmol/L 135 135 124(L)  Potassium 3.5 - 5.1 mmol/L 3.4(L) 3.5 5.1  Chloride 98 - 111 mmol/L - 102 -  CO2 22 - 32 mmol/L - 12(L) -  Calcium 8.9 - 10.3 mg/dL - 9.5 -  Total Protein 6.5 - 8.1 g/dL - 7.6 -  Total Bilirubin 0.3 - 1.2 mg/dL - 1.8(H) -  Alkaline Phos 38 - 126 U/L - 116 -  AST 15 - 41 U/L - 15 -  ALT 0 - 44 U/L - 17 -    Discharge Instructions:   Signed: Jean Rosenthal, MD 10/07/2018, 6:21 AM   Pager: [phone] IMT[facility] PGY-1

## 2018-10-06 NOTE — ED Notes (Signed)
EDP notified of CBG of 53, neuro status the same as arrival, will continue to monitor.

## 2018-10-06 NOTE — ED Triage Notes (Signed)
Pt arrives via [facility] from home, mother reported lethargy x1 day and abdominal pain with N/V x 2 days. CBG 70 with EMS. Pt took meth yesterday for first time, pt took insulin today and denies taking anything else. Pt answering some questions. Poor PO intake in past 2 days per family.

## 2018-10-06 NOTE — H&P (Addendum)
Date: 10/06/2018               Patient Name:  [name] [name]. MRN: [id number]  DOB: [birth year] Age / Sex: 18 y.o., male   PCP: Patient, No Pcp Per         Medical Service: Internal Medicine Teaching Service         Attending Physician: Dr. Heide Spark    First Contact: Dr. Dortha Schwalbe Pager: [phone]  Second Contact: Dr. Alinda Money Pager: [phone]       After Hours (After 5p/  First Contact Pager: [phone]  weekends / holidays): Second Contact Pager: [phone]   Chief Complaint: Nausea, vomiting, Lethargy   History of Present Illness: [name] is a 18 year old gentleman with type 1 diabetes mellitus w/ frequent ED visits for diabetic ketoacidosis, major depressive disorder, asthma, seizure disorder, borderline personality disorder, noncompliance who presented to [facility] emergency department on October 06, 2018 via EMS with 2-day history of nausea, abdominal pain and lethargy for 1 day.  As well as poor p.o. intake over the last several days.  Also complaining of reproducible right-sided chest pain however denies shortness of breath, dizziness, lightheadedness, fevers, chills, headaches, probably urea, polydipsia.  Per nursing staff, he was accompanied by his mother to the ED however when the internal medicine team went to assess patient, the mom was not present.  [name] [name] reports that prior to presentation to the ED, he had taking his insulin (states that he took 5 units of short acting).  He last presented to the emergency department on September 25, 2018 however left AMA right after he was transferred to the floor.  ED course: Afebrile, BP 110s/70s, initial CBG was 83 >>53>>139, CMP reviewed bicarb 12, creatinine 1.72, lipase normal at 21, salicylate normal, Tylenol normal, ethanol normal, lactic acid unremarkable, beta hydroxybutyrate elevated at 5.94, ABG consistent with metabolic acidosis 7.3/20 5/192 with bicarbonate of 13.  He received 1 L bolus of normal saline in ED and was started on D5 half  NS and regular insulin.  Meds:  No outpatient medications have been marked as taking for the 10/06/18 encounter [facility] Encounter).   Medications prior to admission -Albuterol -Prozac 20 mg daily -Hydroxyzine 50 mg daily -NovoLog 0-15 units 3 times daily -Lantus 20 units twice daily -Trazodone 100 mg nightly   Allergies: Allergies as of 10/06/2018 - Review Complete 10/06/2018  Allergen Reaction Noted   Bee venom Anaphylaxis 11/23/2012   Fish allergy Anaphylaxis 07/24/2017   Shellfish allergy Anaphylaxis 07/24/2017   Tea Anaphylaxis 08/18/2018   Past Medical History:  Diagnosis Date   ADD (attention deficit disorder)    Allergy    Anxiety    Asthma    Depression    Epileptic seizure (HCC)    "both petit and grand mal; last sz ~ 2 wk ago" (06/17/2013)   Headache(784.0)    "2-3 times/wk usually" (06/17/2013)   Heart murmur    "heard a slight one earlier today" (06/17/2013)   Homeless    Migraine    "maybe once/month; it's severe" (06/17/2013)   ODD (oppositional defiant disorder)    Sickle cell trait (HCC)    Type I diabetes mellitus (HCC)    "that's what they're thinking now" (06/17/2013)   Vision abnormalities    "takes him longer to focus cause; from his sz" (06/17/2013)    Family History: Denies any family history of diabetes or hypertension  Social History: 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  He lives in [place].  Review of Systems: A complete ROS was negative except as per HPI.  As per HPI  Review of Systems  Constitutional: Positive for malaise/fatigue. Negative for chills, diaphoresis and fever.  HENT: Negative.   Eyes: Negative for blurred vision and double vision.  Respiratory: Negative.   Cardiovascular: Positive for chest pain (Right sided (reprodicible)).  Gastrointestinal: Positive for abdominal pain (Diffuse ).  Genitourinary: Negative.   Musculoskeletal: Negative.   Skin:  Negative.   Neurological: Negative for dizziness, seizures, weakness and headaches.  Psychiatric/Behavioral: Negative.     Physical Exam: Blood pressure 110/70, pulse 84, temperature 97.9 F (36.6 C), temperature source Oral, resp. rate 16, SpO2 98 %.  Physical Exam Vitals signs and nursing note reviewed.  Constitutional:      General: He is not in acute distress.    Appearance: He is well-developed. He is ill-appearing. He is not toxic-appearing or diaphoretic.  HENT:     Head: Normocephalic and atraumatic.     Mouth/Throat:     Pharynx: Oropharynx is clear.     Comments: Dry mucous membrane Eyes:     Extraocular Movements: Extraocular movements intact.  Cardiovascular:     Rate and Rhythm: Normal rate and regular rhythm.     Heart sounds: Normal heart sounds. No murmur.  Pulmonary:     Effort: Pulmonary effort is normal. No respiratory distress.     Breath sounds: Normal breath sounds. No wheezing, rhonchi or rales.  Chest:     Chest wall: Tenderness present.  Abdominal:     General: Bowel sounds are normal. There is no distension. There are no signs of injury.     Palpations: Abdomen is soft.     Tenderness: There is abdominal tenderness (Diffuse tenderness). There is no guarding. Negative signs include Murphy's sign, Rovsing's sign and McBurney's sign.  Skin:    General: Skin is warm.     Coloration: Skin is not pale.  Neurological:     General: No focal deficit present.     Comments: Somnolent     EKG: personally reviewed my interpretation is sinus rhythm with T wave inversion in the inferior lateral leads however this seems to be old compared to his previous EKGs.  CT head without contrast: Unremarkable  Assessment & Plan by Problem: Active Problems:   DKA (diabetic ketoacidoses) [facility])  [name] is a 18 year old gentleman with T1DM w/ frequent DKA, MDD, asthma, seizure disorder, borderline personality disorder here for management of diabetic  ketoacidosis.  #Euglycemic DKA: Presents with 2 to 3-day history of nausea, diffuse abdominal pain and lethargy.  On arrival to the ED he was noted to be hypoglycemic with CBG of 83 and repeat of 53.  This is due to the fact that he had taking insulin a couple hours before presenting to the emergency department.  CMP revealed increased anion gap metabolic acidosis with unremarkable glucose, beta hydroxybutyrate was elevated at 5.9, salicylate, Tylenol, ethanol, lactic acid, serum osmolality were all unremarkable.  His ABG was consistent with metabolic acidosis with results as follow 7.34/20 4/192.  His ongoing euglycemic DKA is most likely due to longstanding history of medication nonadherence but however administering insulin right before presentation.  He is not on SGLT2 inhibitor which is also known to cause euglycemic DKA.  While I was putting my admission orders, and kept getting multiple pages from the nurse regarding patient wanting to leave AGAINST MEDICAL ADVICE.  We made several attempts to convince him  to stay however this was unsuccessful. - Continue IV fluids with normal saline - Continue insulin infusion - Start D5 half NS when CBG < 250 - CBG every hour - BMP every 4 hours until AG closes x2 - N.p.o. for now  #Prerenal azotemia: sCr 1.7 (baseline ~0.7-0.9 when not in DKA) -Continue to monitor while on IV fluids  #Major depressive disorder #Borderline personality disorder -Hold Prozac, trazodone and hydroxyzine while n.p.o.  FEN: Replace electrolytes as needed, n.p.o. for now VTE ppx: Subcutaneous Lovenox CODE STATUS:  Partial code (OK with Intubation, BiPAP, ACLS meds)  Dispo: Admit patient to Inpatient with expected length of stay greater than 2 midnights.  Signed: Yvette Rack, MD 10/06/2018, 6:17 PM  Pager: [phone] IMTS PGY-1

## 2018-10-06 NOTE — ED Notes (Signed)
Pt refusing in and out cath at this time. Pt aware of need for sample. Pt stating he will leave AMA if we attempt to in and out cath him. Pt awake and talking.

## 2018-10-06 NOTE — ED Notes (Signed)
ED Provider at bedside. 

## 2018-10-06 NOTE — ED Notes (Signed)
Insulin verified with Marcie Bal RN

## 2018-10-06 NOTE — ED Notes (Signed)
Admitting MD at bedside and witnessed pt state since he cannot be fed he wants to leave AMA. Pt notified it is not medically safe and to return if he changes his mind. Pt signed AMA form and this RN witnessed. Pt alert, oriented and ambulatory on departure.

## 2018-10-06 NOTE — ED Notes (Signed)
Per glucostabilizer, 0 units to be administered. RN to stop current infusion.

## 2018-10-10 ENCOUNTER — Emergency Department (HOSPITAL_COMMUNITY)
Admission: EM | Admit: 2018-10-10 | Discharge: 2018-10-10 | Disposition: A | Discharge status: Home / Self Care | Payer: Medicaid Other | Attending: Emergency Medicine | Admitting: Emergency Medicine

## 2018-10-10 ENCOUNTER — Emergency Department (HOSPITAL_COMMUNITY): Payer: Medicaid Other

## 2018-10-10 ENCOUNTER — Other Ambulatory Visit: Payer: Self-pay

## 2018-10-10 ENCOUNTER — Ambulatory Visit (HOSPITAL_COMMUNITY)
Admission: EM | Admit: 2018-10-10 | Discharge: 2018-10-10 | Disposition: A | Discharge status: Home / Self Care | Payer: Medicaid Other

## 2018-10-10 ENCOUNTER — Encounter (HOSPITAL_COMMUNITY): Payer: Self-pay | Admitting: Radiology

## 2018-10-10 DIAGNOSIS — Z79899 Other long term (current) drug therapy: Secondary | ICD-10-CM | POA: Insufficient documentation

## 2018-10-10 DIAGNOSIS — Z7722 Contact with and (suspected) exposure to environmental tobacco smoke (acute) (chronic): Secondary | ICD-10-CM | POA: Insufficient documentation

## 2018-10-10 DIAGNOSIS — J45909 Unspecified asthma, uncomplicated: Secondary | ICD-10-CM | POA: Diagnosis not present

## 2018-10-10 DIAGNOSIS — R109 Unspecified abdominal pain: Secondary | ICD-10-CM | POA: Diagnosis present

## 2018-10-10 DIAGNOSIS — Z794 Long term (current) use of insulin: Secondary | ICD-10-CM | POA: Insufficient documentation

## 2018-10-10 DIAGNOSIS — R7401 Elevation of levels of liver transaminase levels: Secondary | ICD-10-CM

## 2018-10-10 DIAGNOSIS — E109 Type 1 diabetes mellitus without complications: Secondary | ICD-10-CM | POA: Insufficient documentation

## 2018-10-10 DIAGNOSIS — F909 Attention-deficit hyperactivity disorder, unspecified type: Secondary | ICD-10-CM | POA: Diagnosis not present

## 2018-10-10 DIAGNOSIS — R740 Nonspecific elevation of levels of transaminase and lactic acid dehydrogenase [LDH]: Secondary | ICD-10-CM | POA: Insufficient documentation

## 2018-10-10 LAB — URINALYSIS, ROUTINE W REFLEX MICROSCOPIC
Bacteria, UA: NONE SEEN
Bilirubin Urine: NEGATIVE
Glucose, UA: >=500 mg/dL — AB
Hgb urine dipstick: NEGATIVE
Ketones, ur: 5 mg/dL — AB
Leukocytes,Ua: NEGATIVE
Nitrite: NEGATIVE
Protein, ur: NEGATIVE mg/dL
Specific Gravity, Urine: 1.022 (ref 1.005–1.030)
pH: 8 (ref 5.0–8.0)

## 2018-10-10 LAB — CBC WITH DIFFERENTIAL/PLATELET
Abs Immature Granulocytes: 0.06 10*3/uL (ref 0.00–0.07)
Basophils Absolute: 0 10*3/uL (ref 0.0–0.1)
Basophils Relative: 0 %
Eosinophils Absolute: 0.1 10*3/uL (ref 0.0–0.5)
Eosinophils Relative: 1 %
HCT: 41.3 % (ref 39.0–52.0)
Hemoglobin: 13.8 g/dL (ref 13.0–17.0)
Immature Granulocytes: 1 %
Lymphocytes Relative: 11 %
Lymphs Abs: 0.8 10*3/uL (ref 0.7–4.0)
MCH: 28 pg (ref 26.0–34.0)
MCHC: 33.4 g/dL (ref 30.0–36.0)
MCV: 83.8 fL (ref 80.0–100.0)
Monocytes Absolute: 0.7 10*3/uL (ref 0.1–1.0)
Monocytes Relative: 10 %
Neutro Abs: 5.5 10*3/uL (ref 1.7–7.7)
Neutrophils Relative %: 77 %
Platelets: 182 10*3/uL (ref 150–400)
RBC: 4.93 MIL/uL (ref 4.22–5.81)
RDW: 12.1 % (ref 11.5–15.5)
WBC: 7.2 10*3/uL (ref 4.0–10.5)
nRBC: 0 % (ref 0.0–0.2)

## 2018-10-10 LAB — CBG MONITORING, ED
Glucose-Capillary: 390 mg/dL — ABNORMAL HIGH (ref 70–99)
Glucose-Capillary: 375 mg/dL — ABNORMAL HIGH (ref 70–99)
Glucose-Capillary: 313 mg/dL — ABNORMAL HIGH (ref 70–99)
Glucose-Capillary: 320 mg/dL — ABNORMAL HIGH (ref 70–99)
Glucose-Capillary: 313 mg/dL — ABNORMAL HIGH (ref 70–99)

## 2018-10-10 LAB — RAPID URINE DRUG SCREEN, HOSP PERFORMED
Amphetamines: NOT DETECTED
Barbiturates: NOT DETECTED
Benzodiazepines: NOT DETECTED
Cocaine: NOT DETECTED
Opiates: NOT DETECTED
Tetrahydrocannabinol: NOT DETECTED

## 2018-10-10 LAB — COMPREHENSIVE METABOLIC PANEL
ALT: 214 U/L — ABNORMAL HIGH (ref 0–44)
AST: 640 U/L — ABNORMAL HIGH (ref 15–41)
Albumin: 3.3 g/dL — ABNORMAL LOW (ref 3.5–5.0)
Alkaline Phosphatase: 220 U/L — ABNORMAL HIGH (ref 38–126)
Anion gap: 11 (ref 5–15)
BUN: 11 mg/dL (ref 6–20)
CO2: 27 mmol/L (ref 22–32)
Calcium: 8.6 mg/dL — ABNORMAL LOW (ref 8.9–10.3)
Chloride: 95 mmol/L — ABNORMAL LOW (ref 98–111)
Creatinine, Ser: 0.74 mg/dL (ref 0.61–1.24)
GFR calc Af Amer: >60 mL/min (ref >60)
GFR calc non Af Amer: >60 mL/min (ref >60)
Glucose, Bld: 401 mg/dL — ABNORMAL HIGH (ref 70–99)
Potassium: 3.9 mmol/L (ref 3.5–5.1)
Sodium: 133 mmol/L — ABNORMAL LOW (ref 135–145)
Total Bilirubin: 1.3 mg/dL — ABNORMAL HIGH (ref 0.3–1.2)
Total Protein: 5.8 g/dL — ABNORMAL LOW (ref 6.5–8.1)

## 2018-10-10 LAB — POCT I-STAT EG7
Acid-Base Excess: 5 mmol/L — ABNORMAL HIGH (ref 0.0–2.0)
Bicarbonate: 29.9 mmol/L — ABNORMAL HIGH (ref 20.0–28.0)
Calcium, Ion: 1.11 mmol/L — ABNORMAL LOW (ref 1.15–1.40)
HCT: 42 % (ref 39.0–52.0)
Hemoglobin: 14.3 g/dL (ref 13.0–17.0)
O2 Saturation: 86 %
Potassium: 3.9 mmol/L (ref 3.5–5.1)
Sodium: 133 mmol/L — ABNORMAL LOW (ref 135–145)
TCO2: 31 mmol/L (ref 22–32)
pCO2, Ven: 45.1 mmHg (ref 44.0–60.0)
pH, Ven: 7.429 (ref 7.250–7.430)
pO2, Ven: 51 mmHg — ABNORMAL HIGH (ref 32.0–45.0)

## 2018-10-10 LAB — LIPASE, BLOOD: Lipase: 24 U/L (ref 11–51)

## 2018-10-10 LAB — TROPONIN I: Troponin I: <0.03 ng/mL (ref <0.03)

## 2018-10-10 LAB — ETHANOL: Alcohol, Ethyl (B): <10 mg/dL (ref <10)

## 2018-10-10 LAB — BETA-HYDROXYBUTYRIC ACID: Beta-Hydroxybutyric Acid: 0.69 mmol/L — ABNORMAL HIGH (ref 0.05–0.27)

## 2018-10-10 LAB — ACETAMINOPHEN LEVEL: Acetaminophen (Tylenol), Serum: <10 ug/mL — ABNORMAL LOW (ref 10–30)

## 2018-10-10 MED ORDER — INSULIN ASPART 100 UNIT/ML ~~LOC~~ SOLN: 0.0000 [IU] | Freq: Every day | SUBCUTANEOUS | Status: DC

## 2018-10-10 MED ORDER — INSULIN REGULAR(HUMAN) IN NACL 100-0.9 UT/100ML-% IV SOLN
INTRAVENOUS | Status: DC
  Administered 2018-10-10: 3.2 [IU]/h via INTRAVENOUS
  Filled 2018-10-10: qty 100

## 2018-10-10 MED ORDER — INSULIN PEN NEEDLE 32G X 8 MM MISC: 0 refills | Status: DC

## 2018-10-10 MED ORDER — INSULIN GLARGINE 100 UNIT/ML SOLOSTAR PEN: 20.0000 [IU] | PEN_INJECTOR | Freq: Two times a day (BID) | SUBCUTANEOUS | 0 refills | Status: DC

## 2018-10-10 MED ORDER — ALUM & MAG HYDROXIDE-SIMETH 200-200-20 MG/5ML PO SUSP
30.0000 mL | Freq: Once | ORAL | Status: AC
  Administered 2018-10-10: 30 mL via ORAL
  Filled 2018-10-10: qty 30

## 2018-10-10 MED ORDER — SODIUM CHLORIDE 0.9 % IV BOLUS
1000.0000 mL | Freq: Once | INTRAVENOUS | Status: AC
  Administered 2018-10-10: 1000 mL via INTRAVENOUS

## 2018-10-10 MED ORDER — INSULIN ASPART 100 UNIT/ML ~~LOC~~ SOLN
0.0000 [IU] | Freq: Three times a day (TID) | SUBCUTANEOUS | Status: DC
  Administered 2018-10-10: 11 [IU] via SUBCUTANEOUS

## 2018-10-10 MED ORDER — ONDANSETRON HCL 4 MG/2ML IJ SOLN
4.0000 mg | Freq: Once | INTRAMUSCULAR | Status: AC
  Administered 2018-10-10: 4 mg via INTRAVENOUS
  Filled 2018-10-10: qty 2

## 2018-10-10 MED ORDER — FENTANYL CITRATE (PF) 100 MCG/2ML IJ SOLN
100.0000 ug | Freq: Once | INTRAMUSCULAR | Status: AC
  Administered 2018-10-10: 100 ug via INTRAVENOUS
  Filled 2018-10-10: qty 2

## 2018-10-10 MED ORDER — INSULIN ASPART 100 UNIT/ML FLEXPEN: 0.0000 [IU] | PEN_INJECTOR | Freq: Three times a day (TID) | SUBCUTANEOUS | 0 refills | Status: DC

## 2018-10-10 MED ORDER — IOHEXOL 300 MG/ML  SOLN
100.0000 mL | Freq: Once | INTRAMUSCULAR | Status: AC | PRN
  Administered 2018-10-10: 100 mL via INTRAVENOUS

## 2018-10-10 MED ORDER — LIDOCAINE VISCOUS HCL 2 % MT SOLN
15.0000 mL | Freq: Once | OROMUCOSAL | Status: AC
  Administered 2018-10-10: 15 mL via ORAL
  Filled 2018-10-10: qty 15

## 2018-10-10 NOTE — ED Provider Notes (Signed)
[facility] EMERGENCY DEPARTMENT Provider Note   CSN: [id number] Arrival date & time: 10/10/18  1220    History   Chief Complaint Chief Complaint  Patient presents with  . Abdominal Pain  . Chest Pain    HPI [name] Cueto. is a 18 y.o. male presents emergency department complaint of chest pain and shortness of the patient is visibly anxious and upset and is having difficulty providing a history.  He has a history of homelessness, recurrent DKA and frequent hospitalization, poor compliance with medications, underlying cluster B personality issues and psychiatric disorder.  Patient states that he has had a cough for several weeks.  He has been having chest pain and abdominal pain for the past 3 days.  He states that he has had difficulty eating because it makes the pain in his chest worse.  He states that the chest pain radiates into his arm and is sharp, intermittent.  He denies pleuritic chest pain, exertional pain or dyspnea.  He also complains of pain in his epigastrium.  He denies nausea or vomiting.  He states that his chest pain is worse in his abdominal pain.  He is unsure if this is feels like his previous episodes of DKA.  He is unsure what his last hemoglobin A1c was.  He states that his blood sugars run between 4 and 700.     HPI  Past Medical History:  Diagnosis Date  . ADD (attention deficit disorder)   . Allergy   . Anxiety   . Asthma   . Depression   . Epileptic seizure ([place])    "both petit and grand mal; last sz ~ 2 wk ago" (06/17/2013)  . Headache(784.0)    "2-3 times/wk usually" (06/17/2013)  . Heart murmur    "heard a slight one earlier today" (06/17/2013)  . Homeless   . Migraine    "maybe once/month; it's severe" (06/17/2013)  . ODD (oppositional defiant disorder)   . Sickle cell trait ([place])   . Type I diabetes mellitus ([place])    "that's what they're thinking now" (06/17/2013)  . Vision abnormalities    "takes him longer to focus  cause; from his sz" (06/17/2013)    Patient Active Problem List   Diagnosis Date Noted  . AKI (acute kidney injury) ([place]) 09/15/2018  . URI (upper respiratory infection) 09/15/2018  . Adjustment disorder with mixed disturbance of emotions and conduct   . Atypical chest pain 08/19/2018  . Borderline personality disorder ([place]) 02/26/2018  . MDD (major depressive disorder), recurrent, severe, with psychosis ([place]) 02/25/2018  . Major depressive disorder, recurrent severe without psychotic features ([place]) 07/24/2017  . DKA, type 1 ([place]) 07/16/2017  . Nausea & vomiting 07/16/2017  . Uncontrolled type 1 diabetes circulatory disorder erectile dysfunction ([place])   . DKA (diabetic ketoacidoses) ([place]) 12/18/2016  . History of seizures 12/01/2016  . History of migraine 12/01/2016  . Disordered eating 03/08/2015  . Ketonuria   . Adjustment reaction to medical therapy   . Non compliance w medication regimen   . Diabetic peripheral neuropathy associated with type 1 diabetes mellitus ([place]) 09/29/2014  . Dehydration 08/22/2014  . Hyperglycemia due to type 1 diabetes mellitus ([place]) 08/21/2014  . Type 1 diabetes mellitus with hyperglycemia ([place])   . Noncompliance   . Generalized abdominal pain   . Glycosuria   . Depression 07/12/2014  . Involuntary movements 06/13/2014  . Bilateral leg pain 06/13/2014  . 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 Suicidal ideations 03/31/2014  . ODD (oppositional defiant disorder) 03/03/2014  . MDD (major depressive disorder), recurrent episode, moderate ([place]) 02/16/2014  . Attention deficit hyperactivity disorder (ADHD), combined type, moderate 02/16/2014  . Microcytic anemia 02/15/2014  . Hyperglycemia 02/14/2014  . Goiter 02/04/2014  . 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[place]) 02/04/2014  . Acquired acanthosis nigricans 02/04/2014  . Obesity, morbid ([place]) 02/04/2014  . Insulin  resistance 02/04/2014  . Hyperinsulinemia 02/04/2014  . Hypoglycemia associated with diabetes ([place]) 02/02/2014  . Maladaptive health behaviors affecting medical condition 02/02/2014  . Hypoglycemia 02/02/2014  . Type 1 diabetes mellitus in patient 57 to 20 years of age with hemoglobin A1c goal of less than 7.5% ([place]) 01/26/2014  . Partial epilepsy with impairment of consciousness ([place]) 12/13/2013  . 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 Migraine without aura 12/13/2013  . Body mass index, pediatric, greater than or equal to 95th percentile for age 61/16/2015  . Asthma 12/13/2013  . Hypoglycemia unawareness in type 1 diabetes mellitus ([place]) 08/08/2013  . Seizure disorder ([place]) 07/06/2013  . Short-term memory loss 07/06/2013  . Sickle cell trait ([place]) 07/06/2013  . Diabetes ([place]) 06/17/2013  . Diabetes mellitus, new onset ([place]) 06/17/2013    Past Surgical History:  Procedure Laterality Date  . CIRCUMCISION  2000  . FINGER SURGERY Left 2001   "crushed pinky; had to repair it" (06/17/2013)        Home Medications    Prior to Admission medications   Medication Sig Start Date End Date Taking? Authorizing Provider  albuterol (PROVENTIL HFA;VENTOLIN HFA) 108 (90 Base) MCG/ACT inhaler Inhale 1 puff into the lungs every 4 (four) hours as needed for wheezing or shortness of breath. 09/16/18   Ghimire, Henreitta Leber, MD  blood glucose meter kit and supplies Dispense based on patient and insurance preference. Use up to four times daily as directed. (FOR ICD-10 E10.9, E11.9). 09/16/18   Ghimire, Henreitta Leber, MD  FLUoxetine (PROZAC) 20 MG capsule Take 1 capsule (20 mg total) by mouth daily. Patient not taking: Reported on 09/25/2018 09/20/18   Patrecia Pour, NP  glucose blood (FREESTYLE TEST STRIPS) test strip Use as instructed 09/16/18   Jonetta Osgood, MD  hydrOXYzine (ATARAX/VISTARIL) 50 MG tablet  10/05/18   [provider]  insulin aspart (NOVOLOG FLEXPEN) 100 UNIT/ML  FlexPen Inject 0-15 Units into the skin 3 (three) times daily with meals. In addition to 8 units given if you eat >50% of a meal Patient not taking: Reported on 09/25/2018 09/16/18   Jonetta Osgood, MD  Insulin Glargine (LANTUS) 100 UNIT/ML Solostar Pen Inject 20 Units into the skin 2 (two) times daily. Patient not taking: Reported on 09/25/2018 09/16/18   Jonetta Osgood, MD  Insulin Pen Needle 32G X 8 MM MISC Use as directed 09/16/18   Jonetta Osgood, MD  traZODone (DESYREL) 100 MG tablet Take 1 tablet (100 mg total) by mouth at bedtime as needed for sleep. Patient not taking: Reported on 09/25/2018 09/19/18   Patrecia Pour, NP    Family History Family History  Problem Relation Age of Onset  . Asthma Mother   . COPD Mother   . Other Mother        possible autoimmune, unclear  . Diabetes Maternal Grandmother   . Heart disease Maternal Grandmother   . Hypertension Maternal Grandmother   . Mental illness Maternal Grandmother   . Heart disease Maternal Grandfather   .  Hyperlipidemia Paternal Grandmother   . Hyperlipidemia Paternal Grandfather   . Migraines Sister        Hemiplegic Migraines     Social History Social History   Tobacco Use  . Smoking status: Passive Smoke Exposure - Never Smoker  . Smokeless tobacco: Never Used  . Tobacco comment: Mom and dad smoke outside   Substance Use Topics  . Alcohol use: No    Alcohol/week: 0.0 standard drinks  . Drug use: No     Allergies   Bee venom; Fish allergy; Shellfish allergy; and Tea   Review of Systems Review of Systems  Ten systems reviewed and are negative for acute change, except as noted in the HPI.   Physical Exam Updated Vital Signs BP 130/87   Pulse 99   Temp 98.8 F (37.1 C) (Oral)   Ht _0  (1.778 m)   Wt 90.7 kg   SpO2 98%   BMI 28.70 kg/m   Physical Exam Vitals signs and nursing note reviewed.  Constitutional:      General: He is not in acute distress.    Appearance: He is  well-developed. He is not diaphoretic.  HENT:     Head: Normocephalic and atraumatic.  Eyes:     General: No scleral icterus.    Conjunctiva/sclera: Conjunctivae normal.  Neck:     Musculoskeletal: Normal range of motion and neck supple.  Cardiovascular:     Rate and Rhythm: Normal rate and regular rhythm.     Heart sounds: Normal heart sounds.  Pulmonary:     Effort: Pulmonary effort is normal. No respiratory distress.     Breath sounds: Normal breath sounds.  Abdominal:     Palpations: Abdomen is soft.     Tenderness: There is no abdominal tenderness.  Skin:    General: Skin is warm and dry.  Neurological:     Mental Status: He is alert.  Psychiatric:        Behavior: Behavior normal.      ED Treatments / Results  Labs (all labs ordered are listed, but only abnormal results are displayed) Labs Reviewed  CBG MONITORING, ED - Abnormal; Notable for the following components:      Result Value   Glucose-Capillary 390 (*)    All other components within normal limits  CBC WITH DIFFERENTIAL/PLATELET  URINALYSIS, ROUTINE W REFLEX MICROSCOPIC  BETA-HYDROXYBUTYRIC ACID  BLOOD GAS, VENOUS  RAPID URINE DRUG SCREEN, HOSP PERFORMED  COMPREHENSIVE METABOLIC PANEL  LIPASE, BLOOD  TROPONIN I  CBG MONITORING, ED    EKG EKG Interpretation  Date/Time:  Sunday October 10 2018 12:43:51 EDT Ventricular Rate:  103 PR Interval:    QRS Duration: 86 QT Interval:  326 QTC Calculation: 427 R Axis:   57 Text Interpretation:  Sinus tachycardia Borderline repolarization abnormality Borderline ST elevation, anterolateral leads No significant change since last tracing Confirmed by Gareth Morgan [id number]) on 10/10/2018 12:56:31 PM   Radiology No results found.  Procedures Procedures (including critical care time)  Medications Ordered in ED Medications  insulin regular, human (MYXREDLIN) 100 units/ 100 mL infusion (has no administration in time range)     Initial Impression /  Assessment and Plan / ED Course  I have reviewed the triage vital signs and the nursing notes.  Pertinent labs & imaging results that were available during my care of the patient were reviewed by me and considered in my medical decision making (see chart for details).  Clinical Course as of Oct 09 1525  Sun Oct 10, 2018  1342 Potassium: 3.9 [AH]  1512 AST(!): 640 [AH]  1512 ALT(!): 214 [AH]  1512 Alkaline Phosphatase(!): 220 [AH]  1512 Patient with markedly    [AH]    Clinical Course User Index [AH] Margarita Mail, PA-C       18 year old male with chest pain and abdominal pain.  History of recurrent DKA admissions.abdominal tenderness on examination.  Lab finding showing markedly elevated transaminitis.  No evidence of DKA.  EKG unchanged from previous.  CT abdomen is pending.  Have ordered a right upper quadrant abdominal ultrasound.  Negative lipase.  Concern for choledocholithiasis.  Likely will need admission.  Discussed case and plan with PA Givens who will take the patient at shift change.  Final Clinical Impressions(s) / ED Diagnoses   Final diagnoses:  None    ED Discharge Orders    None       Margarita Mail, PA-C 10/10/18 1529    Gareth Morgan, MD 10/19/18 [phone]

## 2018-10-10 NOTE — ED Notes (Signed)
Pt returned to room from CT

## 2018-10-10 NOTE — ED Notes (Signed)
Patient transported to CT 

## 2018-10-10 NOTE — ED Notes (Signed)
CBG 390; ED provider notified.

## 2018-10-10 NOTE — ED Triage Notes (Signed)
Pt arrives POV from home c/o chest pain in center of chest that radiates down left arm that started yesterday morning. Pt states his left arm "feels weird." Pt also reports abdominal pain started 4 days ago and reports diarrhea for 2 days but has now resolved. Pt has DM1, epilepsy, asthma, a heart murmur; pt was recently d/c'd on 10/06/18 for DKA.

## 2018-10-10 NOTE — Discharge Instructions (Signed)
You were seen in the ED for abdominal pain.  Your liver function levels were elevated.  Imaging was reassuring.  You had a little bit of gallbladder sludge which could be contributing to her abdominal pain.  You need to call gastroenterology as soon as possible for further evaluation of this.  You will need to get these labs repeated to make sure that they are going down.

## 2018-10-10 NOTE — ED Provider Notes (Signed)
1530: Handoff from previous ED PA at shift change pending CT A/P and RUQ [place] for evaluation of right upper abdominal pain, transaminitis.  See previous note for full details.  Lab work reviewed remarkable for hyperglycemia but no DKA.  Physical Exam  BP 134/84 (BP Location: Right Arm)   Pulse 87   Temp 98.8 F (37.1 C) (Oral)   Resp 16   Ht 5\' 10"  (1.778 m)   Wt 90.7 kg   SpO2 98%   BMI 28.70 kg/m   Physical Exam Vitals signs and nursing note reviewed.  Constitutional:      Appearance: He is well-developed.     Comments: Non toxic.  HENT:     Head: Normocephalic and atraumatic.     Nose: Nose normal.  Eyes:     Conjunctiva/sclera: Conjunctivae normal.  Neck:     Musculoskeletal: Normal range of motion.  Cardiovascular:     Rate and Rhythm: Normal rate and regular rhythm.     Heart sounds: Normal heart sounds.  Pulmonary:     Effort: Pulmonary effort is normal.     Breath sounds: Normal breath sounds.  Abdominal:     General: Bowel sounds are normal.     Palpations: Abdomen is soft.     Tenderness: There is no abdominal tenderness.     Comments: No G/R/R. No suprapubic or CVA tenderness. Negative Murphy's and McBurney's  Musculoskeletal: Normal range of motion.  Skin:    General: Skin is warm and dry.     Capillary Refill: Capillary refill takes less than 2 seconds.  Neurological:     Mental Status: He is alert.  Psychiatric:        Behavior: Behavior normal.     ED Course/Procedures   Clinical Course as of Oct 09 1857  Sun Oct 10, 2018  1342 Potassium: 3.9 [AH]  1512 AST(!): 640 [AH]  1512 ALT(!): 214 [AH]  1512 Alkaline Phosphatase(!): 220 [AH]  1512 Patient with markedly    [AH]  1602 Total Bilirubin(!): 1.3 [CG]  1603 Glucose(!): 401 [CG]  1603 Anion gap: 11 [CG]  1632 IMPRESSION: 1. Mild gallbladder sludge. 2. Otherwise normal.  US ABDOMEN LIMITED RUQ [CG]  1632 IMPRESSION: 1. No acute finding. 2. Probable hepatic steatosis.=  CT ABDOMEN PELVIS W  CONTRAST [CG]  1632 Glucose-Capillary(!): 320 [CG]    Clinical Course User Index [AH] Arthor Captain, PA-C [CG] Liberty Handy, PA-C    Procedures  MDM   [phone]: I have reevaluated patient twice.  Hyperglycemia responding to sliding scale.  Acetaminophen and EtOH levels undetectable.  No recurrence of abdominal pain, nausea or emesis.  He has eaten a meal here without difficulty.  I discussed his elevated liver function tests and discussed importance of avoiding Tylenol, alcohol and modifying his diet.  He was told to f/u with GI and/or [facility] for re-check of his LFTs. Given improvement in abdominal pain, successful oral challenge, imaging unremarkable, I feel patient is appropriate for discharge.  I discussed this with him and he is comfortable.  He is requesting refill on his insulin.  Strict return precautions were discussed.  Given elevated LFTs acute hepatitis panel, HIV antibody ordered which are pending.    Liberty Handy, PA-C 10/10/18 1859    Geoffery Lyons, MD 10/10/18 2226

## 2018-10-10 NOTE — ED Notes (Signed)
Xray tech at bedside.

## 2018-10-10 NOTE — ED Notes (Signed)
Insulin verified with [facility]

## 2018-10-10 NOTE — ED Notes (Signed)
Pt given food and drink, okay per PA

## 2018-10-10 NOTE — ED Notes (Signed)
Insulin verified with Megan, B. RN

## 2018-10-11 ENCOUNTER — Other Ambulatory Visit: Payer: Self-pay

## 2018-10-11 ENCOUNTER — Emergency Department
Admission: EM | Admit: 2018-10-11 | Discharge: 2018-10-11 | Disposition: A | Discharge status: Home / Self Care | Payer: Medicaid Other | Attending: Emergency Medicine | Admitting: Emergency Medicine

## 2018-10-11 ENCOUNTER — Encounter: Payer: Self-pay | Admitting: Emergency Medicine

## 2018-10-11 DIAGNOSIS — J45909 Unspecified asthma, uncomplicated: Secondary | ICD-10-CM | POA: Insufficient documentation

## 2018-10-11 DIAGNOSIS — F909 Attention-deficit hyperactivity disorder, unspecified type: Secondary | ICD-10-CM | POA: Insufficient documentation

## 2018-10-11 DIAGNOSIS — Z794 Long term (current) use of insulin: Secondary | ICD-10-CM | POA: Diagnosis not present

## 2018-10-11 DIAGNOSIS — F329 Major depressive disorder, single episode, unspecified: Secondary | ICD-10-CM | POA: Diagnosis not present

## 2018-10-11 DIAGNOSIS — Z7722 Contact with and (suspected) exposure to environmental tobacco smoke (acute) (chronic): Secondary | ICD-10-CM | POA: Diagnosis not present

## 2018-10-11 DIAGNOSIS — E109 Type 1 diabetes mellitus without complications: Secondary | ICD-10-CM | POA: Diagnosis not present

## 2018-10-11 DIAGNOSIS — Z046 Encounter for general psychiatric examination, requested by authority: Secondary | ICD-10-CM | POA: Diagnosis present

## 2018-10-11 DIAGNOSIS — R443 Hallucinations, unspecified: Secondary | ICD-10-CM | POA: Insufficient documentation

## 2018-10-11 DIAGNOSIS — F331 Major depressive disorder, recurrent, moderate: Secondary | ICD-10-CM | POA: Diagnosis present

## 2018-10-11 LAB — URINE DRUG SCREEN, QUALITATIVE (ARMC ONLY)
Amphetamines, Ur Screen: NOT DETECTED
Barbiturates, Ur Screen: NOT DETECTED
Benzodiazepine, Ur Scrn: NOT DETECTED
Cannabinoid 50 Ng, Ur ~~LOC~~: NOT DETECTED
Cocaine Metabolite,Ur ~~LOC~~: NOT DETECTED
MDMA (Ecstasy)Ur Screen: NOT DETECTED
Methadone Scn, Ur: NOT DETECTED
Opiate, Ur Screen: NOT DETECTED
Phencyclidine (PCP) Ur S: NOT DETECTED
Tricyclic, Ur Screen: NOT DETECTED

## 2018-10-11 LAB — URINALYSIS, COMPLETE (UACMP) WITH MICROSCOPIC
Bacteria, UA: NONE SEEN
Bilirubin Urine: NEGATIVE
Glucose, UA: >=500 mg/dL — AB
Hgb urine dipstick: NEGATIVE
Ketones, ur: NEGATIVE mg/dL
Leukocytes,Ua: NEGATIVE
Nitrite: NEGATIVE
Protein, ur: NEGATIVE mg/dL
Specific Gravity, Urine: 1.023 (ref 1.005–1.030)
Squamous Epithelial / HPF: NONE SEEN (ref 0–5)
WBC, UA: NONE SEEN WBC/hpf (ref 0–5)
pH: 7 (ref 5.0–8.0)

## 2018-10-11 LAB — COMPREHENSIVE METABOLIC PANEL
ALT: 136 U/L — ABNORMAL HIGH (ref 0–44)
AST: 83 U/L — ABNORMAL HIGH (ref 15–41)
Albumin: 3.8 g/dL (ref 3.5–5.0)
Alkaline Phosphatase: 197 U/L — ABNORMAL HIGH (ref 38–126)
Anion gap: 14 (ref 5–15)
BUN: 11 mg/dL (ref 6–20)
CO2: 25 mmol/L (ref 22–32)
Calcium: 9 mg/dL (ref 8.9–10.3)
Chloride: 95 mmol/L — ABNORMAL LOW (ref 98–111)
Creatinine, Ser: 0.58 mg/dL — ABNORMAL LOW (ref 0.61–1.24)
GFR calc Af Amer: >60 mL/min (ref >60)
GFR calc non Af Amer: >60 mL/min (ref >60)
Glucose, Bld: 369 mg/dL — ABNORMAL HIGH (ref 70–99)
Potassium: 4 mmol/L (ref 3.5–5.1)
Sodium: 134 mmol/L — ABNORMAL LOW (ref 135–145)
Total Bilirubin: 0.3 mg/dL (ref 0.3–1.2)
Total Protein: 7 g/dL (ref 6.5–8.1)

## 2018-10-11 LAB — CBC
HCT: 41.6 % (ref 39.0–52.0)
Hemoglobin: 14.2 g/dL (ref 13.0–17.0)
MCH: 29.2 pg (ref 26.0–34.0)
MCHC: 34.1 g/dL (ref 30.0–36.0)
MCV: 85.4 fL (ref 80.0–100.0)
Platelets: 204 10*3/uL (ref 150–400)
RBC: 4.87 MIL/uL (ref 4.22–5.81)
RDW: 12.3 % (ref 11.5–15.5)
WBC: 5.9 10*3/uL (ref 4.0–10.5)
nRBC: 0 % (ref 0.0–0.2)

## 2018-10-11 LAB — GLUCOSE, CAPILLARY
Glucose-Capillary: 430 mg/dL — ABNORMAL HIGH (ref 70–99)
Glucose-Capillary: 381 mg/dL — ABNORMAL HIGH (ref 70–99)

## 2018-10-11 LAB — SALICYLATE LEVEL: Salicylate Lvl: <7 mg/dL (ref 2.8–30.0)

## 2018-10-11 LAB — ACETAMINOPHEN LEVEL: Acetaminophen (Tylenol), Serum: <10 ug/mL — ABNORMAL LOW (ref 10–30)

## 2018-10-11 LAB — HIV ANTIBODY (ROUTINE TESTING W REFLEX): HIV Screen 4th Generation wRfx: NONREACTIVE

## 2018-10-11 LAB — ETHANOL: Alcohol, Ethyl (B): <10 mg/dL (ref <10)

## 2018-10-11 MED ORDER — INSULIN GLARGINE 100 UNIT/ML ~~LOC~~ SOLN
20.0000 [IU] | Freq: Two times a day (BID) | SUBCUTANEOUS | Status: DC
  Filled 2018-10-11 (×2): qty 0.2

## 2018-10-11 MED ORDER — TRAZODONE HCL 50 MG PO TABS: 50.0000 mg | ORAL_TABLET | Freq: Every evening | ORAL | Status: DC | PRN

## 2018-10-11 MED ORDER — INSULIN ASPART 100 UNIT/ML ~~LOC~~ SOLN
0.0000 [IU] | Freq: Three times a day (TID) | SUBCUTANEOUS | Status: DC
  Administered 2018-10-11: 18:00:00 10 [IU] via SUBCUTANEOUS
  Filled 2018-10-11: qty 1

## 2018-10-11 MED ORDER — HYDROXYZINE HCL 25 MG PO TABS: 25.0000 mg | ORAL_TABLET | Freq: Three times a day (TID) | ORAL | Status: DC | PRN

## 2018-10-11 MED ORDER — FLUOXETINE HCL 10 MG PO CAPS
10.0000 mg | ORAL_CAPSULE | Freq: Every day | ORAL | Status: DC
  Filled 2018-10-11: qty 1

## 2018-10-11 MED ORDER — INSULIN ASPART 100 UNIT/ML ~~LOC~~ SOLN: 10.0000 [IU] | Freq: Once | SUBCUTANEOUS | Status: DC

## 2018-10-11 MED ORDER — SODIUM CHLORIDE 0.9 % IV BOLUS
1000.0000 mL | Freq: Once | INTRAVENOUS | Status: AC
  Administered 2018-10-11: 17:00:00 1000 mL via INTRAVENOUS
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
## 2018-10-11 NOTE — ED Notes (Signed)
Pt IV DC  LM EDT

## 2018-10-11 NOTE — Discharge Instructions (Addendum)
Return to the emergency room if you have any thoughts of hurting yourself or others.
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
## 2018-10-11 NOTE — ED Provider Notes (Signed)
-----------------------------------------   6:58 PM on 10/11/2018 -----------------------------------------  Was signed out to me 315,, blood sugar is somewhat elevated but looks to be chronically so.  Often it is over 400 and a few days ago was over 600.  He is not in DKA I have given him insulin IV fluids.  He has no complaints.  Does have a history of noncompliance.  Patient is hungry and eating here.  He has been evaluated by the psychiatric team here.  They know him very well.  He has no SI no HI, they do not think the patient needs to be admitted they think he is safe for discharge and he himself is now expressed desire to leave.  He contracts for safety has no thoughts of hurting self or anyone else no evidence of ongoing psychosis and has been psychiatrically cleared by the psychiatric group.  We will discharge him.  Return precautions follow-up given understood.   Jeanmarie Plant, MD 10/11/18 1901
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
## 2018-10-11 NOTE — ED Notes (Signed)
Pt is stable in NAD at discharge. Discharge instruction given and patient verbalized understanding. Patient's belongings given at the time of discharge. No issues.

## 2018-10-11 NOTE — ED Triage Notes (Signed)
Also says he is hearing voices

## 2018-10-11 NOTE — Progress Notes (Addendum)
Inpatient Diabetes Program Recommendations  AACE/ADA: New Consensus Statement on Inpatient Glycemic Control (2015)  Target Ranges:  Prepandial:   less than 140 mg/dL      Peak postprandial:   less than 180 mg/dL (1-2 hours)      Critically ill patients:  140 - 180 mg/dL   Results for SONG, GENNUSA (MRN [id number]) as of 10/11/2018 16:06  Ref. Range 10/10/2018 12:46 10/10/2018 13:50 10/10/2018 15:02 10/10/2018 16:09 10/10/2018 17:19  Glucose-Capillary Latest Ref Range: 70 - 99 mg/dL 591 (H) 638 (H)  IV Insulin Drip 313 (H)  IV Insulin Drip stopped 320 (H) 313 (H)  11 units NOVOLOG    Results for HASCAL, GARROD (MRN [id number]) as of 10/11/2018 16:06  Ref. Range 10/11/2018 14:49  Glucose Latest Ref Range: 70 - 99 mg/dL 017 (H)    Admit with: Hearing voices/ Hallucinations  History: Type 1 Diabetes, Depression  Home DM Meds: Lantus 20 units BID       Novolog 8 units TID with meals       Novolog 0-15 units TID per SSI  Current Orders: Lantus 20 units BID      Novolog Moderate Correction Scale/ SSI (0-15 units) TID AC     MD- May also consider placing orders for Novolog Meal Coverage while awaiting placement.  Novolog 6 units TID with meals  (Please add the following Hold Parameters: Hold if pt eats <50% of meal, Hold if pt NPO)     Note Consult to Psychiatry team placed.  Lantus and Novolog insulins also ordered this afternoon.  Patient to receive 1L NS bolus as well.  Patient well known to the Inpatient Diabetes Team (frequent admissions over the last 6 months)  Will follow during hospitalization.      --Will follow patient during hospitalization--  Ambrose Finland RN, MSN, CDE Diabetes Coordinator Inpatient Glycemic Control Team Team Pager: [phone] (8a-5p)

## 2018-10-12 LAB — HEPATITIS PANEL, ACUTE
HCV Ab: <0.1 s/co ratio (ref 0.0–0.9)
Hep A IgM: NEGATIVE
Hep B C IgM: NEGATIVE
Hepatitis B Surface Ag: NEGATIVE

## 2018-10-20 ENCOUNTER — Emergency Department (HOSPITAL_COMMUNITY)
Admission: EM | Admit: 2018-10-20 | Discharge: 2018-10-20 | Disposition: A | Discharge status: Home / Self Care | Payer: Medicaid Other | Attending: Emergency Medicine | Admitting: Emergency Medicine

## 2018-10-20 ENCOUNTER — Encounter (HOSPITAL_COMMUNITY): Payer: Self-pay | Admitting: Emergency Medicine

## 2018-10-20 ENCOUNTER — Emergency Department (HOSPITAL_COMMUNITY): Payer: Medicaid Other

## 2018-10-20 ENCOUNTER — Other Ambulatory Visit: Payer: Self-pay

## 2018-10-20 DIAGNOSIS — Z8709 Personal history of other diseases of the respiratory system: Secondary | ICD-10-CM | POA: Diagnosis not present

## 2018-10-20 DIAGNOSIS — F15922 Other stimulant use, unspecified with intoxication with perceptual disturbance: Secondary | ICD-10-CM | POA: Insufficient documentation

## 2018-10-20 DIAGNOSIS — E109 Type 1 diabetes mellitus without complications: Secondary | ICD-10-CM | POA: Insufficient documentation

## 2018-10-20 DIAGNOSIS — R0789 Other chest pain: Secondary | ICD-10-CM | POA: Insufficient documentation

## 2018-10-20 DIAGNOSIS — Z7722 Contact with and (suspected) exposure to environmental tobacco smoke (acute) (chronic): Secondary | ICD-10-CM | POA: Insufficient documentation

## 2018-10-20 DIAGNOSIS — R42 Dizziness and giddiness: Secondary | ICD-10-CM | POA: Insufficient documentation

## 2018-10-20 DIAGNOSIS — D573 Sickle-cell trait: Secondary | ICD-10-CM | POA: Insufficient documentation

## 2018-10-20 DIAGNOSIS — F909 Attention-deficit hyperactivity disorder, unspecified type: Secondary | ICD-10-CM | POA: Diagnosis not present

## 2018-10-20 DIAGNOSIS — Z79899 Other long term (current) drug therapy: Secondary | ICD-10-CM | POA: Insufficient documentation

## 2018-10-20 DIAGNOSIS — R000 Tachycardia, unspecified: Secondary | ICD-10-CM | POA: Diagnosis not present

## 2018-10-20 DIAGNOSIS — F419 Anxiety disorder, unspecified: Secondary | ICD-10-CM | POA: Diagnosis present

## 2018-10-20 DIAGNOSIS — Z794 Long term (current) use of insulin: Secondary | ICD-10-CM | POA: Diagnosis not present

## 2018-10-20 DIAGNOSIS — F913 Oppositional defiant disorder: Secondary | ICD-10-CM | POA: Insufficient documentation

## 2018-10-20 LAB — COMPREHENSIVE METABOLIC PANEL
ALT: 32 U/L (ref 0–44)
AST: 26 U/L (ref 15–41)
Albumin: 3.9 g/dL (ref 3.5–5.0)
Alkaline Phosphatase: 111 U/L (ref 38–126)
Anion gap: 14 (ref 5–15)
BUN: <5 mg/dL — ABNORMAL LOW (ref 6–20)
CO2: 20 mmol/L — ABNORMAL LOW (ref 22–32)
Calcium: 9.2 mg/dL (ref 8.9–10.3)
Chloride: 102 mmol/L (ref 98–111)
Creatinine, Ser: 0.67 mg/dL (ref 0.61–1.24)
GFR calc Af Amer: >60 mL/min (ref >60)
GFR calc non Af Amer: >60 mL/min (ref >60)
Glucose, Bld: 190 mg/dL — ABNORMAL HIGH (ref 70–99)
Potassium: 3.6 mmol/L (ref 3.5–5.1)
Sodium: 136 mmol/L (ref 135–145)
Total Bilirubin: 1.4 mg/dL — ABNORMAL HIGH (ref 0.3–1.2)
Total Protein: 6.9 g/dL (ref 6.5–8.1)

## 2018-10-20 LAB — CBG MONITORING, ED
Glucose-Capillary: 192 mg/dL — ABNORMAL HIGH (ref 70–99)
Glucose-Capillary: 112 mg/dL — ABNORMAL HIGH (ref 70–99)
Glucose-Capillary: 299 mg/dL — ABNORMAL HIGH (ref 70–99)
Glucose-Capillary: 322 mg/dL — ABNORMAL HIGH (ref 70–99)

## 2018-10-20 LAB — CBC
HCT: 45.9 % (ref 39.0–52.0)
Hemoglobin: 15.9 g/dL (ref 13.0–17.0)
MCH: 28.9 pg (ref 26.0–34.0)
MCHC: 34.6 g/dL (ref 30.0–36.0)
MCV: 83.5 fL (ref 80.0–100.0)
Platelets: 293 10*3/uL (ref 150–400)
RBC: 5.5 MIL/uL (ref 4.22–5.81)
RDW: 12 % (ref 11.5–15.5)
WBC: 13.2 10*3/uL — ABNORMAL HIGH (ref 4.0–10.5)
nRBC: 0 % (ref 0.0–0.2)

## 2018-10-20 LAB — RAPID URINE DRUG SCREEN, HOSP PERFORMED
Amphetamines: POSITIVE — AB
Barbiturates: NOT DETECTED
Benzodiazepines: NOT DETECTED
Cocaine: NOT DETECTED
Opiates: NOT DETECTED
Tetrahydrocannabinol: NOT DETECTED

## 2018-10-20 LAB — SALICYLATE LEVEL: Salicylate Lvl: <7 mg/dL (ref 2.8–30.0)

## 2018-10-20 LAB — ETHANOL: Alcohol, Ethyl (B): <10 mg/dL (ref <10)

## 2018-10-20 LAB — ACETAMINOPHEN LEVEL: Acetaminophen (Tylenol), Serum: <10 ug/mL — ABNORMAL LOW (ref 10–30)

## 2018-10-20 LAB — TROPONIN I: Troponin I: <0.03 ng/mL (ref <0.03)

## 2018-10-20 MED ORDER — SODIUM CHLORIDE 0.9 % IV BOLUS
1000.0000 mL | Freq: Once | INTRAVENOUS | Status: AC
  Administered 2018-10-20: 18:00:00 1000 mL via INTRAVENOUS

## 2018-10-20 MED ORDER — ACETAMINOPHEN 500 MG PO TABS
1000.0000 mg | ORAL_TABLET | Freq: Once | ORAL | Status: AC
  Administered 2018-10-20: 1000 mg via ORAL
  Filled 2018-10-20: qty 2

## 2018-10-20 MED ORDER — INSULIN GLARGINE 100 UNIT/ML ~~LOC~~ SOLN
20.0000 [IU] | Freq: Once | SUBCUTANEOUS | Status: AC
  Administered 2018-10-20: 21:00:00 20 [IU] via SUBCUTANEOUS
  Filled 2018-10-20: qty 0.2

## 2018-10-20 MED ORDER — INSULIN GLARGINE 100 UNIT/ML ~~LOC~~ SOLN
30.0000 [IU] | Freq: Once | SUBCUTANEOUS | Status: DC
  Filled 2018-10-20: qty 0.3

## 2018-10-20 MED ORDER — SODIUM CHLORIDE 0.9% FLUSH: 3.0000 mL | Freq: Once | INTRAVENOUS | Status: DC

## 2018-10-20 NOTE — ED Notes (Signed)
Pt BS noted to be elevated now after coke and two [place] sandwiches. Pt given water to drink.

## 2018-10-20 NOTE — Discharge Instructions (Addendum)
Please return for any new or worsening symptoms.   Please contact family services at the [place] to reestablish mental health care.
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
## 2018-10-20 NOTE — ED Triage Notes (Signed)
Pt c/o feeling "jumpy" and anxious after trying meth 30 min ago.  Also reports L sided chest pain.

## 2018-10-20 NOTE — ED Notes (Signed)
[employer] talking with pt now.

## 2018-10-20 NOTE — ED Notes (Signed)
Attempted to talk with patient, pt would not have any conversation with nurse.

## 2018-10-20 NOTE — ED Notes (Signed)
Patient verbalizes understanding of discharge instructions. Opportunity for questioning and answers were provided. Armband removed by staff, pt discharged from ED ambulatory to home.  
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
## 2018-10-26 ENCOUNTER — Other Ambulatory Visit: Payer: Self-pay

## 2018-10-26 ENCOUNTER — Encounter (HOSPITAL_COMMUNITY): Payer: Self-pay | Admitting: Emergency Medicine

## 2018-10-26 ENCOUNTER — Observation Stay (HOSPITAL_COMMUNITY)
Admission: EM | Admit: 2018-10-26 | Discharge: 2018-10-26 | Discharge status: Against Medical Advice | Payer: Medicaid Other | Attending: Internal Medicine | Admitting: Internal Medicine

## 2018-10-26 DIAGNOSIS — Z818 Family history of other mental and behavioral disorders: Secondary | ICD-10-CM | POA: Diagnosis not present

## 2018-10-26 DIAGNOSIS — E1042 Type 1 diabetes mellitus with diabetic polyneuropathy: Secondary | ICD-10-CM | POA: Diagnosis not present

## 2018-10-26 DIAGNOSIS — F333 Major depressive disorder, recurrent, severe with psychotic symptoms: Secondary | ICD-10-CM | POA: Insufficient documentation

## 2018-10-26 DIAGNOSIS — G92 Toxic encephalopathy: Secondary | ICD-10-CM | POA: Diagnosis not present

## 2018-10-26 DIAGNOSIS — R000 Tachycardia, unspecified: Secondary | ICD-10-CM | POA: Diagnosis not present

## 2018-10-26 DIAGNOSIS — Z590 Homelessness: Secondary | ICD-10-CM | POA: Diagnosis not present

## 2018-10-26 DIAGNOSIS — R748 Abnormal levels of other serum enzymes: Secondary | ICD-10-CM | POA: Insufficient documentation

## 2018-10-26 DIAGNOSIS — E1043 Type 1 diabetes mellitus with diabetic autonomic (poly)neuropathy: Secondary | ICD-10-CM | POA: Insufficient documentation

## 2018-10-26 DIAGNOSIS — F419 Anxiety disorder, unspecified: Secondary | ICD-10-CM | POA: Diagnosis not present

## 2018-10-26 DIAGNOSIS — F15921 Other stimulant use, unspecified with intoxication delirium: Secondary | ICD-10-CM

## 2018-10-26 DIAGNOSIS — R413 Other amnesia: Secondary | ICD-10-CM | POA: Insufficient documentation

## 2018-10-26 DIAGNOSIS — F15129 Other stimulant abuse with intoxication, unspecified: Principal | ICD-10-CM | POA: Insufficient documentation

## 2018-10-26 DIAGNOSIS — F902 Attention-deficit hyperactivity disorder, combined type: Secondary | ICD-10-CM | POA: Insufficient documentation

## 2018-10-26 DIAGNOSIS — R569 Unspecified convulsions: Secondary | ICD-10-CM | POA: Insufficient documentation

## 2018-10-26 DIAGNOSIS — J45909 Unspecified asthma, uncomplicated: Secondary | ICD-10-CM | POA: Insufficient documentation

## 2018-10-26 DIAGNOSIS — N529 Male erectile dysfunction, unspecified: Secondary | ICD-10-CM | POA: Diagnosis not present

## 2018-10-26 DIAGNOSIS — F4325 Adjustment disorder with mixed disturbance of emotions and conduct: Secondary | ICD-10-CM | POA: Insufficient documentation

## 2018-10-26 DIAGNOSIS — Z79899 Other long term (current) drug therapy: Secondary | ICD-10-CM | POA: Diagnosis not present

## 2018-10-26 DIAGNOSIS — F15929 Other stimulant use, unspecified with intoxication, unspecified: Secondary | ICD-10-CM

## 2018-10-26 DIAGNOSIS — D573 Sickle-cell trait: Secondary | ICD-10-CM | POA: Insufficient documentation

## 2018-10-26 DIAGNOSIS — E1010 Type 1 diabetes mellitus with ketoacidosis without coma: Secondary | ICD-10-CM | POA: Diagnosis present

## 2018-10-26 DIAGNOSIS — F603 Borderline personality disorder: Secondary | ICD-10-CM | POA: Diagnosis not present

## 2018-10-26 DIAGNOSIS — Z794 Long term (current) use of insulin: Secondary | ICD-10-CM | POA: Diagnosis not present

## 2018-10-26 DIAGNOSIS — Z7722 Contact with and (suspected) exposure to environmental tobacco smoke (acute) (chronic): Secondary | ICD-10-CM | POA: Diagnosis not present

## 2018-10-26 HISTORY — DX: Other stimulant use, unspecified with intoxication, unspecified: F15.929

## 2018-10-26 LAB — COMPREHENSIVE METABOLIC PANEL
ALT: 33 U/L (ref 0–44)
AST: 23 U/L (ref 15–41)
Albumin: 4.1 g/dL (ref 3.5–5.0)
Alkaline Phosphatase: 144 U/L — ABNORMAL HIGH (ref 38–126)
Anion gap: 24 — ABNORMAL HIGH (ref 5–15)
BUN: 9 mg/dL (ref 6–20)
CO2: 15 mmol/L — ABNORMAL LOW (ref 22–32)
Calcium: 9.7 mg/dL (ref 8.9–10.3)
Chloride: 94 mmol/L — ABNORMAL LOW (ref 98–111)
Creatinine, Ser: 0.99 mg/dL (ref 0.61–1.24)
GFR calc Af Amer: >60 mL/min (ref >60)
GFR calc non Af Amer: >60 mL/min (ref >60)
Glucose, Bld: 404 mg/dL — ABNORMAL HIGH (ref 70–99)
Potassium: 4.5 mmol/L (ref 3.5–5.1)
Sodium: 133 mmol/L — ABNORMAL LOW (ref 135–145)
Total Bilirubin: 2.2 mg/dL — ABNORMAL HIGH (ref 0.3–1.2)
Total Protein: 7.4 g/dL (ref 6.5–8.1)

## 2018-10-26 LAB — CBC WITH DIFFERENTIAL/PLATELET
Abs Immature Granulocytes: 0.09 10*3/uL — ABNORMAL HIGH (ref 0.00–0.07)
Basophils Absolute: 0.1 10*3/uL (ref 0.0–0.1)
Basophils Relative: 1 %
Eosinophils Absolute: 0 10*3/uL (ref 0.0–0.5)
Eosinophils Relative: 0 %
HCT: 47.9 % (ref 39.0–52.0)
Hemoglobin: 16.1 g/dL (ref 13.0–17.0)
Immature Granulocytes: 1 %
Lymphocytes Relative: 18 %
Lymphs Abs: 1.6 10*3/uL (ref 0.7–4.0)
MCH: 28.5 pg (ref 26.0–34.0)
MCHC: 33.6 g/dL (ref 30.0–36.0)
MCV: 84.8 fL (ref 80.0–100.0)
Monocytes Absolute: 0.5 10*3/uL (ref 0.1–1.0)
Monocytes Relative: 5 %
Neutro Abs: 6.8 10*3/uL (ref 1.7–7.7)
Neutrophils Relative %: 75 %
Platelets: 308 10*3/uL (ref 150–400)
RBC: 5.65 MIL/uL (ref 4.22–5.81)
RDW: 11.9 % (ref 11.5–15.5)
WBC: 9 10*3/uL (ref 4.0–10.5)
nRBC: 0 % (ref 0.0–0.2)

## 2018-10-26 LAB — CBG MONITORING, ED
Glucose-Capillary: 392 mg/dL — ABNORMAL HIGH (ref 70–99)
Glucose-Capillary: 373 mg/dL — ABNORMAL HIGH (ref 70–99)

## 2018-10-26 LAB — GLUCOSE, CAPILLARY
Glucose-Capillary: 200 mg/dL — ABNORMAL HIGH (ref 70–99)
Glucose-Capillary: 301 mg/dL — ABNORMAL HIGH (ref 70–99)

## 2018-10-26 LAB — MRSA PCR SCREENING: MRSA by PCR: NEGATIVE

## 2018-10-26 MED ORDER — POTASSIUM CHLORIDE 10 MEQ/100ML IV SOLN
10.0000 meq | INTRAVENOUS | Status: AC
  Administered 2018-10-26 (×2): 10 meq via INTRAVENOUS
  Filled 2018-10-26 (×2): qty 100

## 2018-10-26 MED ORDER — INSULIN REGULAR(HUMAN) IN NACL 100-0.9 UT/100ML-% IV SOLN
INTRAVENOUS | Status: DC
  Administered 2018-10-26: 3.1 [IU]/h via INTRAVENOUS
  Filled 2018-10-26: qty 100

## 2018-10-26 MED ORDER — SODIUM CHLORIDE 0.9 % IV SOLN
INTRAVENOUS | Status: DC
  Administered 2018-10-26: 16:00:00 via INTRAVENOUS

## 2018-10-26 MED ORDER — ENOXAPARIN SODIUM 40 MG/0.4ML ~~LOC~~ SOLN: 40.0000 mg | SUBCUTANEOUS | Status: DC

## 2018-10-26 MED ORDER — ACETAMINOPHEN 325 MG PO TABS: 650.0000 mg | ORAL_TABLET | Freq: Four times a day (QID) | ORAL | Status: DC | PRN

## 2018-10-26 MED ORDER — FLUOXETINE HCL 20 MG PO CAPS: 20.0000 mg | ORAL_CAPSULE | Freq: Every day | ORAL | Status: DC

## 2018-10-26 MED ORDER — DEXTROSE-NACL 5-0.45 % IV SOLN: INTRAVENOUS | Status: DC

## 2018-10-26 MED ORDER — ALBUTEROL SULFATE (2.5 MG/3ML) 0.083% IN NEBU: 3.0000 mL | INHALATION_SOLUTION | RESPIRATORY_TRACT | Status: DC | PRN

## 2018-10-26 MED ORDER — SODIUM CHLORIDE 0.9 % IV BOLUS
1000.0000 mL | Freq: Once | INTRAVENOUS | Status: AC
  Administered 2018-10-26: 1000 mL via INTRAVENOUS

## 2018-10-26 MED ORDER — ACETAMINOPHEN 650 MG RE SUPP: 650.0000 mg | Freq: Four times a day (QID) | RECTAL | Status: DC | PRN

## 2018-10-26 NOTE — Progress Notes (Signed)
The physician team was paged at ~4:35pm as the patient was indicating that he wished to leave the hospital despite not completing his medical therapy.  At bedside the patient was initially resistive to interacting with the staff. However, after a brief explanation of his condition of diabetic ketoacidosis and the risks associated with leaving this untreated he reluctantly agreed to speak for the fist time since admission. He stated clearly and audibly to the staff present that he was not in DKA, he properly recited his insulin regimen, name, DOB but was not clear why he was at the hospital. When asked to what medication or drug he may have taken he did not reply. He was again advised to remain hospitalized at least overnight to complete his medical therapy, but refused. He requested to leave.   The patient was A/O x4, standing without assistance.   It is my medical opinion that the patient should remain hospitalized overnight to complete the treatment of likely DKA (unable to obtain labs indicating ketosis). After an explanation of the risks and benefits of an early departure prior to completing treatment the patient signed the [facility] paperwork before walking himself out of the hospital.   I remain concerned for his wellbeing but do not feel he met the basic requirements to be involuntarily committed against his will. He stated that he will follow with a doctor as an outpatient.   Kathi Ludwig, MD [facility] Internal Medicine, PGY-2

## 2018-10-26 NOTE — H&P (Signed)
Date: 10/26/2018               Patient Name:  [name] [name]. MRN: [id number]  DOB: January 28, 2001 Age / Sex: 18 y.o., male   PCP: Patient, No Pcp Per         Medical Service: Internal Medicine Teaching Service         Attending Physician: Dr. Sid Falcon, MD    First Contact: Dr. Sharon Seller Pager: [phone]  Second Contact: Dr. Berline Lopes Pager: [phone]       After Hours (After 5p/  First Contact Pager: [phone]  weekends / holidays): Second Contact Pager: [phone]   Chief Complaint: Drug Intoxication  History of Present Illness:  [name] [name] is a 18yo male with T1DM, recurrent DKA, hallucinations, seizure, anxiety, asthma, drug use including most recent ED visit for methamphetamine use with hallucinations, who presented to the Emergency Department after reportedly smoking methamphetamine earlier today per ED note. He additionally has not taken his insulin in the past two days per ED. On seeing patient he is awake but does not respond to questions and appears to be talking to himself. He would not answer questions or follow commands.   Social:  Unable to obtain social history  Family History:  Unable to obtain social history  Meds:  Current Meds  Medication Sig  . FLUoxetine (PROZAC) 20 MG capsule Take 1 capsule (20 mg total) by mouth daily.  . insulin aspart (NOVOLOG FLEXPEN) 100 UNIT/ML FlexPen Inject 0-15 Units into the skin 3 (three) times daily with meals. In addition to 8 units given if you eat >50% of a meal  . Insulin Glargine (LANTUS) 100 UNIT/ML Solostar Pen Inject 20 Units into the skin 2 (two) times daily.  . traZODone (DESYREL) 100 MG tablet Take 1 tablet (100 mg total) by mouth at bedtime as needed for sleep.     Allergies: Allergies as of 10/26/2018 - Review Complete 10/26/2018  Allergen Reaction Noted  . Bee venom Anaphylaxis 11/23/2012  . Fish allergy Anaphylaxis 07/24/2017  . Shellfish allergy Anaphylaxis 07/24/2017  . Tea Anaphylaxis 08/18/2018   Past Medical History:  Diagnosis Date  . ADD (attention deficit disorder)   . Allergy   . Anxiety   . Asthma   . Depression   . Epileptic seizure ([place])    "both petit and grand mal; last sz ~ 2 wk ago" (06/17/2013)  . Headache(784.0)    "2-3 times/wk usually" (06/17/2013)  . Heart murmur    "heard a slight one earlier today" (06/17/2013)  . Homeless   . Migraine    "maybe once/month; it's severe" (06/17/2013)  . ODD (oppositional defiant disorder)   . Sickle cell trait ([place])   . Type I diabetes mellitus ([place])    "that's what they're thinking now" (06/17/2013)  . Vision abnormalities    "takes him longer to focus cause; from his sz" (06/17/2013)     Review of Systems: Unable to complete ROS due to AMS.   Physical Exam: Blood pressure (!) 140/91, pulse (!) 120, temperature 99 F (37.2 C), temperature source Oral, resp. rate 19, SpO2 98 %.  Constitution: disoriented, taking to himself  HENT: [place]/AT Eyes: EOM intact, pupils dilated, no icterus Cardio: tachycardic, regular rhythm, no m/r/g  Respiratory: CTAB, no wheezing, rales, rhonchi Abdominal: soft, non-distended MSK: moving all extremities, no LE edema  Neuro: confused, hallucinating, speaking to self, not following commands or answering questions Skin: c/d/i    Assessment & Plan by Problem:  Active Problems:   Methamphetamine intoxication ([place])  18yo male with T1DM, recurrent DKA, anxiety, asthma, drug use including multiple ED visits for methamphetamins, who presented to the Emergency Department after reportedly smoking methamphetamine earlier today. Patient currently tachycardic and hallucinating but stable and not responding to any questions.   Encephalopathy 2/2 Drug Intoxication vs. History of hallucinations Per ED patient stated he had smoked methamphetamines earlier today, additionally had recent visit with UDS positive for amphetamines. PDMP review shows suboxone Rx in February. Unclear what exactly he took  but appears to be hallucinating, tachycardic, and is not responding to commands or answering questions. He does have a history of seizures and hallucinations per chart. Previous hallucinations were determined not to be a result of neurological deficits as he had MRIs in the past. Overall, symptoms are acute as he was normal at previous discharge.   - UDS ordered  - 1 to 1 observation  - consider psych eval if UDS negative  - admit to progressive, monitor HR - continue home prozac 20 mg qd  Hyperglycemia Unclear but likely DKA as he has a history of recurrent admissions for this. He is altered likely secondary to drug intoxication with psychiatric history and we have been unable to obtain UA.   - UA - beta-hydroxybutyric acid pending - BMP q4h - insulin gtt until glucose <250, then switch to D5 1/2 NS - start lantus 20U bid once anion gap closed x2, and bicarb >18 - K 73mq - give potassium for K < 5.3  Elevated Alkaline Phos History elevated ALK Phos and elevated LFTs. HIV negative 10/10/18. Abdominal [place]4/12/20 without acute findings.   - am CMP   Diet: NPO  VTE: lovenox IVF: s/p NS 3L, currently 175 cc/hr NS Code: full   Dispo: Admit patient to Observation with expected length of stay less than 2 midnights.  Signed:Marty Heck DO 10/26/2018, 3:37 PM  Pager: [phone]

## 2018-10-26 NOTE — Progress Notes (Signed)
[name] [name]. is a 18 y.o. male patient admitted from ED awake, alert - oriented  To self - no acute distress noted.  VSS - Blood pressure (!) 140/91, pulse (!) 120, temperature 99 F (37.2 C), temperature source Oral, resp. rate 19, height 5\' 10"  (1.778 m), SpO2 98 %.    IVs in place, occlusive dsg intact without redness. Pt placed on PC monitor and instructed to call when help is needed. Safety sitter at bedside.  Call light within reach, patient able to voice, and demonstrate understanding. CHG bath completed. Skin, clean-dry- intact without evidence of bruising, or skin tears. No evidence of skin break down noted on exam.     Will cont to eval and treat per MD orders.  Campbell Riches, RN 10/26/2018 3:42 PM

## 2018-10-26 NOTE — ED Notes (Signed)
ED TO INPATIENT HANDOFF REPORT  ED Nurse Name and Phone #:  Orian Figueira [phone]  S Name/Age/Gender [name] [name]. 18 y.o. male Room/Bed: 030C/030C  Code Status   Code Status: Prior  Home/SNF/Other Home Patient oriented to: self Is this baseline? No   Triage Complete: Triage complete  Chief Complaint poss od  Triage Note Pt here with c/o smoking some meth today , pt is a poor historian will talk but not offering much information    Allergies Allergies  Allergen Reactions  . Bee Venom Anaphylaxis  . Fish Allergy Anaphylaxis  . Shellfish Allergy Anaphylaxis  . Tea Anaphylaxis    Level of Care/Admitting Diagnosis ED Disposition    ED Disposition Condition Comment   Admit  Hospital Area: [facility] [[id number]]  Level of Care: Progressive [102]  Covid Evaluation: N/A  Diagnosis: Methamphetamine intoxication ([facility]) [3559741]  Admitting Physician: Inez Catalina [phone]  Attending Physician: Inez Catalina [[id number]  PT Class (Do Not Modify): Observation [104]  PT Acc Code (Do Not Modify): Observation [10022]       B Medical/Surgery History Past Medical History:  Diagnosis Date  . ADD (attention deficit disorder)   . Allergy   . Anxiety   . Asthma   . Depression   . Epileptic seizure ([facility])    "both petit and grand mal; last sz ~ 2 wk ago" (06/17/2013)  . Headache(784.0)    "2-3 times/wk usually" (06/17/2013)  . Heart murmur    "heard a slight one earlier today" (06/17/2013)  . Homeless   . Migraine    "maybe once/month; it's severe" (06/17/2013)  . ODD (oppositional defiant disorder)   . Sickle cell trait ([facility])   . Type I diabetes mellitus ([facility])    "that's what they're thinking now" (06/17/2013)  . Vision abnormalities    "takes him longer to focus cause; from his sz" (06/17/2013)   Past Surgical History:  Procedure Laterality Date  . CIRCUMCISION  2000  . FINGER SURGERY Left 2001   "crushed pinky; had to repair it" (06/17/2013)      A IV Location/Drains/Wounds Patient Lines/Drains/Airways Status   Active Line/Drains/Airways    Name:   Placement date:   Placement time:   Site:   Days:   Peripheral IV 10/26/18 Right Antecubital   10/26/18    1258    Antecubital   less than 1   Wound / Incision (Open or Dehisced) 04/04/18 Non-pressure wound Foot Right   04/04/18    1900    Foot   205          Intake/Output Last 24 hours  Intake/Output Summary (Last 24 hours) at 10/26/2018 1455 Last data filed at 10/26/2018 1435 Gross per 24 hour  Intake 1000 ml  Output -  Net 1000 ml    Labs/Imaging Results for orders placed or performed during the hospital encounter of 10/26/18 (from the past 48 hour(s))  CBG monitoring, ED     Status: Abnormal   Collection Time: 10/26/18 12:37 PM  Result Value Ref Range   Glucose-Capillary 392 (H) 70 - 99 mg/dL  Comprehensive metabolic panel     Status: Abnormal   Collection Time: 10/26/18  1:06 PM  Result Value Ref Range   Sodium 133 (L) 135 - 145 mmol/L   Potassium 4.5 3.5 - 5.1 mmol/L   Chloride 94 (L) 98 - 111 mmol/L   CO2 15 (L) 22 - 32 mmol/L   Glucose, Bld 404 (H) 70 - 99  mg/dL   BUN 9 6 - 20 mg/dL   Creatinine, Ser 1.61 0.61 - 1.24 mg/dL   Calcium 9.7 8.9 - 09.6 mg/dL   Total Protein 7.4 6.5 - 8.1 g/dL   Albumin 4.1 3.5 - 5.0 g/dL   AST 23 15 - 41 U/L   ALT 33 0 - 44 U/L   Alkaline Phosphatase 144 (H) 38 - 126 U/L   Total Bilirubin 2.2 (H) 0.3 - 1.2 mg/dL   GFR calc non Af Amer >60 >60 mL/min   GFR calc Af Amer >60 >60 mL/min   Anion gap 24 (H) 5 - 15    Comment: Performed at [facility], [address] [place], [place] [address]  CBC with Differential     Status: Abnormal   Collection Time: 10/26/18  1:06 PM  Result Value Ref Range   WBC 9.0 4.0 - 10.5 K/uL   RBC 5.65 4.22 - 5.81 MIL/uL   Hemoglobin 16.1 13.0 - 17.0 g/dL   HCT 98.1 19.1 - 47.8 %   MCV 84.8 80.0 - 100.0 fL   MCH 28.5 26.0 - 34.0 pg   MCHC 33.6 30.0 - 36.0 g/dL   RDW 29.5 62.1 - 30.8 %    Platelets 308 150 - 400 K/uL   nRBC 0.0 0.0 - 0.2 %   Neutrophils Relative % 75 %   Neutro Abs 6.8 1.7 - 7.7 K/uL   Lymphocytes Relative 18 %   Lymphs Abs 1.6 0.7 - 4.0 K/uL   Monocytes Relative 5 %   Monocytes Absolute 0.5 0.1 - 1.0 K/uL   Eosinophils Relative 0 %   Eosinophils Absolute 0.0 0.0 - 0.5 K/uL   Basophils Relative 1 %   Basophils Absolute 0.1 0.0 - 0.1 K/uL   Immature Granulocytes 1 %   Abs Immature Granulocytes 0.09 (H) 0.00 - 0.07 K/uL    Comment: Performed at [facility], [address], [place], [place] [address]  CBG monitoring, ED     Status: Abnormal   Collection Time: 10/26/18  2:19 PM  Result Value Ref Range   Glucose-Capillary 373 (H) 70 - 99 mg/dL   No results found.  Pending Labs Unresulted Labs (From admission, onward)    Start     Ordered   10/26/18 1431  Beta-hydroxybutyric acid  Add-on,   R     10/26/18 1431   10/26/18 1246  Urinalysis, Routine w reflex microscopic  ONCE - STAT,   STAT     10/26/18 1245   10/26/18 1240  Urine rapid drug screen (hosp performed)  ONCE - STAT,   STAT     10/26/18 1239          Vitals/Pain Today's Vitals   10/26/18 1239 10/26/18 1243 10/26/18 1300 10/26/18 1431  BP: (!) 136/99  (!) 145/98 112/72  Pulse: (!) 123  (!) 121 (!) 118  Resp: 20  (!) 22 16  Temp:  98.7 F (37.1 C)    TempSrc:  Oral    SpO2: 98%  98% 98%  PainSc:        Isolation Precautions No active isolations  Medications Medications  insulin regular, human (MYXREDLIN) 100 units/ 100 mL infusion (3.1 Units/hr Intravenous New Bag/Given 10/26/18 1433)  potassium chloride 10 mEq in 100 mL IVPB (10 mEq Intravenous New Bag/Given 10/26/18 1442)  dextrose 5 %-0.45 % sodium chloride infusion (has no administration in time range)  sodium chloride 0.9 % bolus 1,000 mL (1,000 mLs Intravenous New  Bag/Given 10/26/18 1424)    And  0.9 %  sodium chloride infusion (has no administration in time range)  sodium chloride 0.9 % bolus 1,000 mL (0 mLs  Intravenous Stopped 10/26/18 1435)    Mobility walks Low fall risk   Focused Assessments Cardiac Assessment Handoff:  Cardiac Rhythm: Sinus tachycardia Lab Results  Component Value Date   CKTOTAL 288 07/12/2018   TROPONINI <0.03 10/20/2018   Lab Results  Component Value Date   DDIMER 0.30 08/18/2018   Does the Patient currently have chest pain? No     R Recommendations: See Admitting Provider Note  Report given to:   Additional Notes:

## 2018-10-26 NOTE — Progress Notes (Signed)
Date: 10/26/2018 Patient: [name] [name]. Admitted: 10/26/2018 12:32 PM Attending Provider: Inez Catalina, MD  [name] has made the decision for the patient to leave the hospital against the advice of Inez Catalina, MD.  He has been informed and understands the inherent risks, including death.  He has decided to accept the responsibility for this decision. [name] and all necessary parties have been advised that he may return for further evaluation or treatment. MD aware of patient wanting to leave. Dequan [name] had current vital signs as follows:  Blood pressure (!) 140/91, pulse (!) 120, temperature 99 F (37.2 C), temperature source Oral, resp. rate 19, height 5\' 10"  (1.778 m), SpO2 98 %.   Cleotis [name] has signed the Leaving Against Medical Advice form prior to leaving the department.  Campbell Riches 10/26/2018

## 2018-10-26 NOTE — ED Triage Notes (Signed)
Pt here with c/o smoking some meth today , pt is a poor historian will talk but not offering much information

## 2018-10-26 NOTE — ED Provider Notes (Signed)
[facility] Provider Note   CSN: [id number] Arrival date & time: 10/26/18  1231    History   Chief Complaint Chief Complaint  Patient presents with  . Drug Overdose  . Hyperglycemia    HPI [name] [name]. is a 18 y.o. male with past medical history of ADD, anxiety, asthma, homelessness, type 1 diabetes, presenting to the emergency department with drug use.  Patient with history of drug use, presenting today reporting recently smoking methamphetamine today.  Unable to tell me time of inhalation or quantity.  Unable to perform ROS due to current intoxication.  Patient is also noted to be a type I diabetic.  Does not know the last time he took insulin.  CBG on arrival was 392. Level 5 caveat secondary to mental status change.    The history is limited by the condition of the patient.    Past Medical History:  Diagnosis Date  . ADD (attention deficit disorder)   . Allergy   . Anxiety   . Asthma   . Depression   . Epileptic seizure ([place])    "both petit and grand mal; last sz ~ 2 wk ago" (06/17/2013)  . Headache(784.0)    "2-3 times/wk usually" (06/17/2013)  . Heart murmur    "heard a slight one earlier today" (06/17/2013)  . Homeless   . Migraine    "maybe once/month; it's severe" (06/17/2013)  . ODD (oppositional defiant disorder)   . Sickle cell trait ([place])   . Type I diabetes mellitus ([place])    "that's what they're thinking now" (06/17/2013)  . Vision abnormalities    "takes him longer to focus cause; from his sz" (06/17/2013)    Patient Active Problem List   Diagnosis Date Noted  . 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 AKI (acute kidney injury) ([place]) 09/15/2018  . URI (upper respiratory infection) 09/15/2018  . Adjustment disorder with mixed disturbance of emotions and conduct   . Atypical chest pain 08/19/2018  . Borderline personality disorder ([place]) 02/26/2018  . MDD (major depressive disorder), recurrent,  severe, with psychosis ([place]) 02/25/2018  . Major depressive disorder, recurrent severe without psychotic features ([place]) 07/24/2017  . DKA, type 1 ([place]) 07/16/2017  . Nausea & vomiting 07/16/2017  . Uncontrolled type 1 diabetes circulatory disorder erectile dysfunction ([place])   . DKA (diabetic ketoacidoses) ([place]) 12/18/2016  . History of seizures 12/01/2016  . History of migraine 12/01/2016  . Disordered eating 03/08/2015  . Ketonuria   . Adjustment reaction to medical therapy   . Non compliance w medication regimen   . Diabetic peripheral neuropathy associated with type 1 diabetes mellitus ([place]) 09/29/2014  . Dehydration 08/22/2014  . Hyperglycemia due to type 1 diabetes mellitus ([place]) 08/21/2014  . Type 1 diabetes mellitus with hyperglycemia ([place])   . Noncompliance   . Generalized abdominal pain   . Glycosuria   . Depression 07/12/2014  . Involuntary movements 06/13/2014  . Bilateral leg pain 06/13/2014  . 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 Suicidal ideations 03/31/2014  . ODD (oppositional defiant disorder) 03/03/2014  . MDD (major depressive disorder), recurrent episode, moderate ([place]) 02/16/2014  . Attention deficit hyperactivity disorder (ADHD), combined type, moderate 02/16/2014  . Microcytic anemia 02/15/2014  . Hyperglycemia 02/14/2014  . Goiter 02/04/2014  . 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[place]) 02/04/2014  . Acquired acanthosis nigricans 02/04/2014  . Obesity, morbid ([place]) 02/04/2014  . Insulin resistance 02/04/2014  .  Hyperinsulinemia 02/04/2014  . Hypoglycemia associated with diabetes ([place]) 02/02/2014  . Maladaptive health behaviors affecting medical condition 02/02/2014  . Hypoglycemia 02/02/2014  . Type 1 diabetes mellitus in patient 24 to 20 years of age with hemoglobin A1c goal of less than 7.5% ([place]) 01/26/2014  . Partial epilepsy with impairment of consciousness ([place])  12/13/2013  . 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 Migraine without aura 12/13/2013  . Body mass index, pediatric, greater than or equal to 95th percentile for age 26/16/2015  . Asthma 12/13/2013  . Hypoglycemia unawareness in type 1 diabetes mellitus ([place]) 08/08/2013  . Seizure disorder ([place]) 07/06/2013  . Short-term memory loss 07/06/2013  . Sickle cell trait ([place]) 07/06/2013  . Diabetes ([place]) 06/17/2013  . Diabetes mellitus, new onset ([place]) 06/17/2013    Past Surgical History:  Procedure Laterality Date  . CIRCUMCISION  2000  . FINGER SURGERY Left 2001   "crushed pinky; had to repair it" (06/17/2013)        Home Medications    Prior to Admission medications   Medication Sig Start Date End Date Taking? Authorizing Provider  FLUoxetine (PROZAC) 20 MG capsule Take 1 capsule (20 mg total) by mouth daily. 09/20/18  Yes Lord, Asa Saunas, NP  insulin aspart (NOVOLOG FLEXPEN) 100 UNIT/ML FlexPen Inject 0-15 Units into the skin 3 (three) times daily with meals. In addition to 8 units given if you eat >50% of a meal 10/10/18  Yes Donney Rankins, Claudia J, PA-C  Insulin Glargine (LANTUS) 100 UNIT/ML Solostar Pen Inject 20 Units into the skin 2 (two) times daily. 10/10/18  Yes Kinnie Feil, PA-C  traZODone (DESYREL) 100 MG tablet Take 1 tablet (100 mg total) by mouth at bedtime as needed for sleep. 09/19/18  Yes Lord, Asa Saunas, NP  albuterol (PROVENTIL HFA;VENTOLIN HFA) 108 (90 Base) MCG/ACT inhaler Inhale 1 puff into the lungs every 4 (four) hours as needed for wheezing or shortness of breath. Patient not taking: Reported on 10/26/2018 09/16/18   Jonetta Osgood, MD  blood glucose meter kit and supplies Dispense based on patient and insurance preference. Use up to four times daily as directed. (FOR ICD-10 E10.9, E11.9). 09/16/18   Ghimire, Henreitta Leber, MD  glucose blood (FREESTYLE TEST STRIPS) test strip Use as instructed 09/16/18   Jonetta Osgood, MD  Insulin Pen Needle 32G X  8 MM MISC Use as directed 10/10/18   Kinnie Feil, PA-C    Family History Family History  Problem Relation Age of Onset  . Asthma Mother   . COPD Mother   . Other Mother        possible autoimmune, unclear  . Diabetes Maternal Grandmother   . Heart disease Maternal Grandmother   . Hypertension Maternal Grandmother   . Mental illness Maternal Grandmother   . Heart disease Maternal Grandfather   . Hyperlipidemia Paternal Grandmother   . Hyperlipidemia Paternal Grandfather   . Migraines Sister        Hemiplegic Migraines     Social History Social History   Tobacco Use  . Smoking status: Passive Smoke Exposure - Never Smoker  . Smokeless tobacco: Never Used  . Tobacco comment: Mom and dad smoke outside   Substance Use Topics  . Alcohol use: No    Alcohol/week: 0.0 standard drinks  . Drug use: Yes    Types: Methamphetamines     Allergies   Bee venom; Fish allergy; Shellfish allergy; and Tea   Review of Systems Review of Systems  Unable to perform ROS: Mental status change     Physical Exam Updated Vital Signs BP 112/72   Pulse (!) 118   Temp 98.7 F (37.1 C) (Oral)   Resp 16   SpO2 98%   Physical Exam Vitals signs and nursing note reviewed.  Constitutional:      Appearance: He is well-developed.  HENT:     Head: Normocephalic and atraumatic.  Eyes:     Extraocular Movements: Extraocular movements intact.     Conjunctiva/sclera: Conjunctivae normal.     Pupils: Pupils are equal, round, and reactive to light.  Cardiovascular:     Rate and Rhythm: Regular rhythm. Tachycardia present.  Pulmonary:     Effort: Pulmonary effort is normal. No respiratory distress.     Breath sounds: Normal breath sounds.  Abdominal:     General: Bowel sounds are normal. There is no distension.     Palpations: Abdomen is soft.     Tenderness: There is no guarding.  Musculoskeletal:     Comments: Patient is spontaneously moving all extremities.  Skin:    General:  Skin is warm.  Neurological:     Mental Status: He is alert.     Comments: Patient is alert and inattentive.  Patient appears to be speaking to his hand and inspecting his hand throughout evaluation.  Cranial nerves are grossly normal.  Psychiatric:        Behavior: Behavior normal.      ED Treatments / Results  Labs (all labs ordered are listed, but only abnormal results are displayed) Labs Reviewed  COMPREHENSIVE METABOLIC PANEL - Abnormal; Notable for the following components:      Result Value   Sodium 133 (*)    Chloride 94 (*)    CO2 15 (*)    Glucose, Bld 404 (*)    Alkaline Phosphatase 144 (*)    Total Bilirubin 2.2 (*)    Anion gap 24 (*)    All other components within normal limits  CBC WITH DIFFERENTIAL/PLATELET - Abnormal; Notable for the following components:   Abs Immature Granulocytes 0.09 (*)    All other components within normal limits  CBG MONITORING, ED - Abnormal; Notable for the following components:   Glucose-Capillary 392 (*)    All other components within normal limits  CBG MONITORING, ED - Abnormal; Notable for the following components:   Glucose-Capillary 373 (*)    All other components within normal limits  RAPID URINE DRUG SCREEN, HOSP PERFORMED  URINALYSIS, ROUTINE W REFLEX MICROSCOPIC  BASIC METABOLIC PANEL  BASIC METABOLIC PANEL  BASIC METABOLIC PANEL  BASIC METABOLIC PANEL  BETA-HYDROXYBUTYRIC ACID    EKG None  Radiology No results found.  Procedures Procedures (including critical care time) CRITICAL CARE Performed by: [place] N Donat Humble   Total critical care time: 35 minutes  Critical care time was exclusive of separately billable procedures and treating other patients.  Critical care was necessary to treat or prevent imminent or life-threatening deterioration.  Critical care was time spent personally by me on the following activities: development of treatment plan with patient and/or surrogate as well as nursing, discussions  with consultants, evaluation of patient's response to treatment, examination of patient, obtaining history from patient or surrogate, ordering and performing treatments and interventions, ordering and review of laboratory studies, ordering and review of radiographic studies, pulse oximetry and re-evaluation of patient's condition.  Medications Ordered in ED Medications  insulin regular, human (MYXREDLIN) 100 units/ 100 mL infusion (3.1 Units/hr Intravenous New Bag/Given  10/26/18 1433)  potassium chloride 10 mEq in 100 mL IVPB (10 mEq Intravenous New Bag/Given 10/26/18 1442)  dextrose 5 %-0.45 % sodium chloride infusion (has no administration in time range)  sodium chloride 0.9 % bolus 1,000 mL (1,000 mLs Intravenous New Bag/Given 10/26/18 1424)    And  0.9 %  sodium chloride infusion (has no administration in time range)  sodium chloride 0.9 % bolus 1,000 mL (0 mLs Intravenous Stopped 10/26/18 1435)     Initial Impression / Assessment and Plan / ED Course  I have reviewed the triage vital signs and the nursing notes.  Pertinent labs & imaging results that were available during my care of the patient were reviewed by me and considered in my medical decision making (see chart for details).  Clinical Course as of Oct 26 1507  Tue Oct 26, 2018  1430 IM accepting admission   [JR]  1445 Patient reevaluated.  Remains tachycardic however does not appear to be in distress.  He still appears to be intoxicated.  Resting comfortably.   [JR]    Clinical Course User Index [JR] Zabria Liss, [place] N, PA-C       Patient with past medical history of drug use and type 1 diabetes as well as homelessness, presenting with apparent drug intoxication.  Patient endorses smoking methamphetamine today, however unable to obtain further history due to patient's intoxication.  Patient is tachycardic on arrival, however is in no apparent distress.  He appears to be hallucinating and is inattentive during evaluation.   Spontaneously moving all extremities.  Spontaneous respirations with normal effort.  Cranial nerves are grossly normal.  Will obtain the labs and provide IV fluids for hyperglycemia.  Will continue to monitor.  Labs revealing diabetic ketoacidosis with anion gap of 24 and bicarb of 15.  Glucose is 404.  No leukocytosis.  Glucose stabilizer initiated.  Patient remains intoxicated however is in no distress.  Internal medicine consulted for admission and accepting.  The patient appears reasonably stabilized for admission considering the current resources, flow, and capabilities available in the ED at this time, and I doubt any other [facility] requiring further screening and/or treatment in the ED prior to admission.  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

## 2018-11-09 ENCOUNTER — Emergency Department (HOSPITAL_COMMUNITY)
Admission: EM | Admit: 2018-11-09 | Discharge: 2018-11-09 | Discharge status: Against Medical Advice | Payer: Medicaid Other | Attending: Emergency Medicine | Admitting: Emergency Medicine

## 2018-11-09 ENCOUNTER — Encounter (HOSPITAL_COMMUNITY): Payer: Self-pay

## 2018-11-09 DIAGNOSIS — Z5321 Procedure and treatment not carried out due to patient leaving prior to being seen by health care provider: Secondary | ICD-10-CM | POA: Insufficient documentation

## 2018-11-09 DIAGNOSIS — Y999 Unspecified external cause status: Secondary | ICD-10-CM | POA: Diagnosis not present

## 2018-11-09 DIAGNOSIS — W260XXA Contact with knife, initial encounter: Secondary | ICD-10-CM | POA: Insufficient documentation

## 2018-11-09 DIAGNOSIS — S61211A Laceration without foreign body of left index finger without damage to nail, initial encounter: Secondary | ICD-10-CM | POA: Diagnosis present

## 2018-11-09 DIAGNOSIS — Y939 Activity, unspecified: Secondary | ICD-10-CM | POA: Insufficient documentation

## 2018-11-09 DIAGNOSIS — Y929 Unspecified place or not applicable: Secondary | ICD-10-CM | POA: Insufficient documentation

## 2018-11-09 NOTE — ED Triage Notes (Signed)
Pt states that he cut his finger with a machete while he was cutting a door. Pt has large lac appx 1 1/2 in to his L index finger, decreased sensation, moderate amount of bleeding, pressure dressing applied.

## 2018-11-09 NOTE — ED Notes (Signed)
Pt is LAMA d/t to having to return back to work. RN educated about increase risk of infection. To return if condition worsens

## 2018-11-10 ENCOUNTER — Emergency Department (HOSPITAL_COMMUNITY)
Admission: EM | Admit: 2018-11-10 | Discharge: 2018-11-10 | Disposition: A | Discharge status: Home / Self Care | Payer: Medicaid Other | Attending: Emergency Medicine | Admitting: Emergency Medicine

## 2018-11-10 ENCOUNTER — Encounter (HOSPITAL_COMMUNITY): Payer: Self-pay

## 2018-11-10 ENCOUNTER — Other Ambulatory Visit: Payer: Self-pay

## 2018-11-10 DIAGNOSIS — E1040 Type 1 diabetes mellitus with diabetic neuropathy, unspecified: Secondary | ICD-10-CM | POA: Insufficient documentation

## 2018-11-10 DIAGNOSIS — J45909 Unspecified asthma, uncomplicated: Secondary | ICD-10-CM | POA: Diagnosis not present

## 2018-11-10 DIAGNOSIS — F909 Attention-deficit hyperactivity disorder, unspecified type: Secondary | ICD-10-CM | POA: Insufficient documentation

## 2018-11-10 DIAGNOSIS — S61211A Laceration without foreign body of left index finger without damage to nail, initial encounter: Secondary | ICD-10-CM | POA: Diagnosis not present

## 2018-11-10 DIAGNOSIS — W268XXA Contact with other sharp object(s), not elsewhere classified, initial encounter: Secondary | ICD-10-CM | POA: Insufficient documentation

## 2018-11-10 DIAGNOSIS — Z794 Long term (current) use of insulin: Secondary | ICD-10-CM | POA: Insufficient documentation

## 2018-11-10 DIAGNOSIS — Y999 Unspecified external cause status: Secondary | ICD-10-CM | POA: Diagnosis not present

## 2018-11-10 DIAGNOSIS — Y929 Unspecified place or not applicable: Secondary | ICD-10-CM | POA: Diagnosis not present

## 2018-11-10 DIAGNOSIS — Z23 Encounter for immunization: Secondary | ICD-10-CM | POA: Insufficient documentation

## 2018-11-10 DIAGNOSIS — Z79899 Other long term (current) drug therapy: Secondary | ICD-10-CM | POA: Diagnosis not present

## 2018-11-10 DIAGNOSIS — Z7722 Contact with and (suspected) exposure to environmental tobacco smoke (acute) (chronic): Secondary | ICD-10-CM | POA: Insufficient documentation

## 2018-11-10 DIAGNOSIS — Y9389 Activity, other specified: Secondary | ICD-10-CM | POA: Insufficient documentation

## 2018-11-10 MED ORDER — BACITRACIN ZINC 500 UNIT/GM EX OINT
TOPICAL_OINTMENT | CUTANEOUS | Status: AC
  Filled 2018-11-10: qty 0.9

## 2018-11-10 MED ORDER — LIDOCAINE HCL (PF) 1 % IJ SOLN
30.0000 mL | Freq: Once | INTRAMUSCULAR | Status: AC
  Administered 2018-11-10: 30 mL
  Filled 2018-11-10: qty 30

## 2018-11-10 MED ORDER — TETANUS-DIPHTH-ACELL PERTUSSIS 5-2.5-18.5 LF-MCG/0.5 IM SUSP
0.5000 mL | Freq: Once | INTRAMUSCULAR | Status: AC
  Administered 2018-11-10: 0.5 mL via INTRAMUSCULAR
  Filled 2018-11-10: qty 0.5

## 2018-11-10 NOTE — ED Triage Notes (Addendum)
Pt presents with a laceration to his L index finger. He states that he was tearing down a shed with a machete. Bleeding controlled.

## 2018-11-10 NOTE — ED Notes (Signed)
Wound irrigated and saline soaked gauze placed on wound to loosen clots. Pt tolerating.

## 2018-11-10 NOTE — ED Notes (Signed)
Pt given and verbalized understanding of d/c instructions and need for follow up and suture removal in 7-10 days. Told to return if s/s worsen. No further distress or questions upon ambulation out of department.

## 2018-11-10 NOTE — ED Provider Notes (Addendum)
[facility] DEPT Provider Note   CSN: [id number] Arrival date & time: 11/10/18  0236    History   Chief Complaint Chief Complaint  Patient presents with  . Laceration    HPI [name] [name]. is a 18 y.o. male.     Patient presents to the emergency department with a chief complaint of left index finger laceration.  He states that he was using a machete while trying to cut down a piece of his shed, and accidentally sliced part of his finger.  Last tetanus shot unknown.  Bleeding controlled prior to arrival.  Patient states that he went to [facility] to be seen, but ended up getting called into work.  He had to leave, but then returned.  States that the injury occurred in the late evening hours last night, estimates that it was within 6 or 7 hours.  Numbness, weakness, tingling.  The history is provided by the patient. No language interpreter was used.    Past Medical History:  Diagnosis Date  . ADD (attention deficit disorder)   . Allergy   . Anxiety   . Asthma   . Depression   . Epileptic seizure ([place])    "both petit and grand mal; last sz ~ 2 wk ago" (06/17/2013)  . Headache(784.0)    "2-3 times/wk usually" (06/17/2013)  . Heart murmur    "heard a slight one earlier today" (06/17/2013)  . Homeless   . Migraine    "maybe once/month; it's severe" (06/17/2013)  . ODD (oppositional defiant disorder)   . Sickle cell trait ([place])   . Type I diabetes mellitus ([place])    "that's what they're thinking now" (06/17/2013)  . Vision abnormalities    "takes him longer to focus cause; from his sz" (06/17/2013)    Patient Active Problem List   Diagnosis Date Noted  . 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 AKI (acute kidney injury) ([place]) 09/15/2018  . URI (upper respiratory infection) 09/15/2018  . Adjustment disorder with mixed disturbance of emotions and conduct   . Atypical chest pain 08/19/2018  . Borderline personality disorder ([place])  02/26/2018  . MDD (major depressive disorder), recurrent, severe, with psychosis ([place]) 02/25/2018  . Major depressive disorder, recurrent severe without psychotic features ([place]) 07/24/2017  . DKA, type 1 ([place]) 07/16/2017  . Nausea & vomiting 07/16/2017  . Uncontrolled type 1 diabetes circulatory disorder erectile dysfunction ([place])   . DKA (diabetic ketoacidoses) ([place]) 12/18/2016  . History of seizures 12/01/2016  . History of migraine 12/01/2016  . Disordered eating 03/08/2015  . Ketonuria   . Adjustment reaction to medical therapy   . Non compliance w medication regimen   . Diabetic peripheral neuropathy associated with type 1 diabetes mellitus ([place]) 09/29/2014  . Dehydration 08/22/2014  . Hyperglycemia due to type 1 diabetes mellitus ([place]) 08/21/2014  . Type 1 diabetes mellitus with hyperglycemia ([place])   . Noncompliance   . Generalized abdominal pain   . Glycosuria   . Depression 07/12/2014  . Involuntary movements 06/13/2014  . Bilateral leg pain 06/13/2014  . 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 Suicidal ideations 03/31/2014  . ODD (oppositional defiant disorder) 03/03/2014  . MDD (major depressive disorder), recurrent episode, moderate ([place]) 02/16/2014  . Attention deficit hyperactivity disorder (ADHD), combined type, moderate 02/16/2014  . Microcytic anemia 02/15/2014  . Hyperglycemia 02/14/2014  . Goiter 02/04/2014  . 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[place]) 02/04/2014  .  Acquired acanthosis nigricans 02/04/2014  . Obesity, morbid ([place]) 02/04/2014  . Insulin resistance 02/04/2014  . Hyperinsulinemia 02/04/2014  . Hypoglycemia associated with diabetes ([place]) 02/02/2014  . Maladaptive health behaviors affecting medical condition 02/02/2014  . Hypoglycemia 02/02/2014  . Type 1 diabetes mellitus in patient 40 to 20 years of age with hemoglobin A1c goal of less than 7.5% ([name]) 01/26/2014  .  Partial epilepsy with impairment of consciousness ([place]) 12/13/2013  . 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 Migraine without aura 12/13/2013  . Body mass index, pediatric, greater than or equal to 95th percentile for age 08/15/2013  . Asthma 12/13/2013  . Hypoglycemia unawareness in type 1 diabetes mellitus ([place]) 08/08/2013  . Seizure disorder ([place]) 07/06/2013  . Short-term memory loss 07/06/2013  . Sickle cell trait ([place]) 07/06/2013  . Diabetes ([place]) 06/17/2013  . Diabetes mellitus, new onset ([place]) 06/17/2013    Past Surgical History:  Procedure Laterality Date  . CIRCUMCISION  2000  . FINGER SURGERY Left 2001   "crushed pinky; had to repair it" (06/17/2013)        Home Medications    Prior to Admission medications   Medication Sig Start Date End Date Taking? Authorizing Provider  albuterol (PROVENTIL HFA;VENTOLIN HFA) 108 (90 Base) MCG/ACT inhaler Inhale 1 puff into the lungs every 4 (four) hours as needed for wheezing or shortness of breath. Patient not taking: Reported on 10/26/2018 09/16/18   Jonetta Osgood, MD  blood glucose meter kit and supplies Dispense based on patient and insurance preference. Use up to four times daily as directed. (FOR ICD-10 E10.9, E11.9). 09/16/18   Ghimire, Henreitta Leber, MD  FLUoxetine (PROZAC) 20 MG capsule Take 1 capsule (20 mg total) by mouth daily. 09/20/18   Patrecia Pour, NP  glucose blood (FREESTYLE TEST STRIPS) test strip Use as instructed 09/16/18   Ghimire, Henreitta Leber, MD  insulin aspart (NOVOLOG FLEXPEN) 100 UNIT/ML FlexPen Inject 0-15 Units into the skin 3 (three) times daily with meals. In addition to 8 units given if you eat >50% of a meal 10/10/18   Kinnie Feil, PA-C  Insulin Glargine (LANTUS) 100 UNIT/ML Solostar Pen Inject 20 Units into the skin 2 (two) times daily. 10/10/18   Kinnie Feil, PA-C  Insulin Pen Needle 32G X 8 MM MISC Use as directed 10/10/18   Kinnie Feil, PA-C  traZODone (DESYREL) 100 MG  tablet Take 1 tablet (100 mg total) by mouth at bedtime as needed for sleep. 09/19/18   Patrecia Pour, NP    Family History Family History  Problem Relation Age of Onset  . Asthma Mother   . COPD Mother   . Other Mother        possible autoimmune, unclear  . Diabetes Maternal Grandmother   . Heart disease Maternal Grandmother   . Hypertension Maternal Grandmother   . Mental illness Maternal Grandmother   . Heart disease Maternal Grandfather   . Hyperlipidemia Paternal Grandmother   . Hyperlipidemia Paternal Grandfather   . Migraines Sister        Hemiplegic Migraines     Social History Social History   Tobacco Use  . Smoking status: Passive Smoke Exposure - Never Smoker  . Smokeless tobacco: Never Used  . Tobacco comment: Mom and dad smoke outside   Substance Use Topics  . Alcohol use: No    Alcohol/week: 0.0 standard drinks  . Drug use: Yes    Types: Methamphetamines     Allergies  Bee venom; Fish allergy; Shellfish allergy; and Tea   Review of Systems Review of Systems  All other systems reviewed and are negative.    Physical Exam Updated Vital Signs BP (!) 157/109 (BP Location: Right Arm)   Pulse 93   Temp 97.8 F (36.6 C) (Oral)   Resp 20   Ht 5' 10.5" (1.791 m)   Wt 88.5 kg   SpO2 100%   BMI 27.58 kg/m   Physical Exam Vitals signs and nursing note reviewed.  Constitutional:      Appearance: He is well-developed.  HENT:     Head: Normocephalic and atraumatic.  Eyes:     Conjunctiva/sclera: Conjunctivae normal.  Neck:     Musculoskeletal: Neck supple.  Cardiovascular:     Rate and Rhythm: Normal rate and regular rhythm.     Heart sounds: No murmur.  Pulmonary:     Effort: Pulmonary effort is normal. No respiratory distress.     Breath sounds: Normal breath sounds.  Abdominal:     Palpations: Abdomen is soft.     Tenderness: There is no abdominal tenderness.  Musculoskeletal:     Comments: Extensor strength of left index finger 5/5  isolated at all joints  Skin:    General: Skin is warm and dry.     Comments: 4 cm fInger laceration as pictured, the wound does open and gape with retraction, but no evidence for intrusion into the joint, small piece of extensor tendon visualised, but appears intact  Neurological:     Mental Status: He is alert and oriented to person, place, and time.  Psychiatric:        Mood and Affect: Mood normal.        Behavior: Behavior normal.        Thought Content: Thought content normal.        Judgment: Judgment normal.        ED Treatments / Results  Labs (all labs ordered are listed, but only abnormal results are displayed) Labs Reviewed - No data to display  EKG None  Radiology No results found.  Procedures .Nerve Block Date/Time: 11/10/2018 6:33 AM Performed by: Montine Circle, PA-C Authorized by: Montine Circle, PA-C   Consent:    Consent obtained:  Verbal   Consent given by:  Patient   Risks discussed:  Infection   Alternatives discussed:  No treatment Indications:    Indications:  Procedural anesthesia Location:    Body area:  Upper extremity   Upper extremity nerve blocked: left index (digit)   Laterality:  Left Pre-procedure details:    Skin preparation:  Povidone-iodine   Preparation: Patient was prepped and draped in usual sterile fashion   Skin anesthesia (see MAR for exact dosages):    Skin anesthesia method:  None Procedure details (see MAR for exact dosages):    Block needle gauge:  24 G   Anesthetic injected:  Lidocaine 1% w/o epi   Steroid injected:  None   Additive injected:  None   Injection procedure:  Anatomic landmarks identified, incremental injection, negative aspiration for blood, introduced needle and anatomic landmarks palpated   Paresthesia:  None Post-procedure details:    Dressing:  Sterile dressing   Outcome:  Anesthesia achieved   Patient tolerance of procedure:  Tolerated well, no immediate complications   (including  critical care time) LACERATION REPAIR Performed by: Montine Circle Authorized by: Montine Circle Consent: Verbal consent obtained. Risks and benefits: risks, benefits and alternatives were discussed Consent given by: patient Patient  identity confirmed: provided demographic data Prepped and Draped in normal sterile fashion Wound explored  Laceration Location: left index finger  Laceration Length: 4cm  No Foreign Bodies seen or palpated  Irrigation method: syringe Amount of cleaning: copious  Skin closure: 5-0 prolene  Number of sutures: 13  Technique: running  Patient tolerance: Patient tolerated the procedure well with no immediate complications.  SPLINT APPLICATION Date/Time: 0:80 AM Authorized by: Montine Circle Consent: Verbal consent obtained. Risks and benefits: risks, benefits and alternatives were discussed Consent given by: patient Splint applied by: me Location details: left index finger Splint type: palmar Supplies used: foam padded metal and coband Post-procedure: The splinted body part was neurovascularly unchanged following the procedure. Patient tolerance: Patient tolerated the procedure well with no immediate complications. Good cap refill following splinting.    Medications Ordered in ED Medications  lidocaine (PF) (XYLOCAINE) 1 % injection 30 mL (has no administration in time range)  Tdap (BOOSTRIX) injection 0.5 mL (has no administration in time range)     Initial Impression / Assessment and Plan / ED Course  I have reviewed the triage vital signs and the nursing notes.  Pertinent labs & imaging results that were available during my care of the patient were reviewed by me and considered in my medical decision making (see chart for details).        Patient with laceration to his left index finger.  Laceration was thoroughly cleaned and inspected in the emergency department.  There was no visible tendon injury.  Range of motion strength of  his left finger is 5/5 isolated at all joints.  Tetanus shot updated.  Laceration repaired.  Patient placed in a finger splint.  Recommend follow-up with PCP.  Sutures out in 7 days.  Final Clinical Impressions(s) / ED Diagnoses   Final diagnoses:  Laceration of left index finger without foreign body without damage to nail, initial encounter    ED Discharge Orders    None       Montine Circle, PA-C 11/10/18 0406    Montine Circle, PA-C 11/10/18 [place], [place], MD 11/10/18 [phone]

## 2018-11-28 ENCOUNTER — Emergency Department (HOSPITAL_COMMUNITY)
Admission: EM | Admit: 2018-11-28 | Discharge: 2018-11-28 | Disposition: A | Discharge status: Home / Self Care | Payer: Medicaid Other | Attending: Emergency Medicine | Admitting: Emergency Medicine

## 2018-11-28 ENCOUNTER — Other Ambulatory Visit: Payer: Self-pay

## 2018-11-28 ENCOUNTER — Encounter (HOSPITAL_COMMUNITY): Payer: Self-pay | Admitting: *Deleted

## 2018-11-28 DIAGNOSIS — J45909 Unspecified asthma, uncomplicated: Secondary | ICD-10-CM | POA: Insufficient documentation

## 2018-11-28 DIAGNOSIS — R739 Hyperglycemia, unspecified: Secondary | ICD-10-CM

## 2018-11-28 DIAGNOSIS — E108 Type 1 diabetes mellitus with unspecified complications: Secondary | ICD-10-CM | POA: Diagnosis present

## 2018-11-28 DIAGNOSIS — Z79899 Other long term (current) drug therapy: Secondary | ICD-10-CM | POA: Insufficient documentation

## 2018-11-28 DIAGNOSIS — Z794 Long term (current) use of insulin: Secondary | ICD-10-CM | POA: Insufficient documentation

## 2018-11-28 DIAGNOSIS — Z7722 Contact with and (suspected) exposure to environmental tobacco smoke (acute) (chronic): Secondary | ICD-10-CM | POA: Insufficient documentation

## 2018-11-28 DIAGNOSIS — E1065 Type 1 diabetes mellitus with hyperglycemia: Secondary | ICD-10-CM | POA: Diagnosis not present

## 2018-11-28 LAB — URINALYSIS, ROUTINE W REFLEX MICROSCOPIC
Bacteria, UA: NONE SEEN
Bilirubin Urine: NEGATIVE
Glucose, UA: >=500 mg/dL — AB
Hgb urine dipstick: NEGATIVE
Ketones, ur: NEGATIVE mg/dL
Leukocytes,Ua: NEGATIVE
Nitrite: NEGATIVE
Protein, ur: NEGATIVE mg/dL
Specific Gravity, Urine: 1.018 (ref 1.005–1.030)
pH: 6 (ref 5.0–8.0)

## 2018-11-28 LAB — CBC
HCT: 43 % (ref 39.0–52.0)
Hemoglobin: 14.5 g/dL (ref 13.0–17.0)
MCH: 28.2 pg (ref 26.0–34.0)
MCHC: 33.7 g/dL (ref 30.0–36.0)
MCV: 83.5 fL (ref 80.0–100.0)
Platelets: 216 10*3/uL (ref 150–400)
RBC: 5.15 MIL/uL (ref 4.22–5.81)
RDW: 11.9 % (ref 11.5–15.5)
WBC: 5.1 10*3/uL (ref 4.0–10.5)
nRBC: 0 % (ref 0.0–0.2)

## 2018-11-28 LAB — BASIC METABOLIC PANEL
Anion gap: 14 (ref 5–15)
BUN: 15 mg/dL (ref 6–20)
CO2: 20 mmol/L — ABNORMAL LOW (ref 22–32)
Calcium: 8.4 mg/dL — ABNORMAL LOW (ref 8.9–10.3)
Chloride: 95 mmol/L — ABNORMAL LOW (ref 98–111)
Creatinine, Ser: 0.84 mg/dL (ref 0.61–1.24)
GFR calc Af Amer: >60 mL/min (ref >60)
GFR calc non Af Amer: >60 mL/min (ref >60)
Glucose, Bld: 722 mg/dL (ref 70–99)
Potassium: 4.9 mmol/L (ref 3.5–5.1)
Sodium: 129 mmol/L — ABNORMAL LOW (ref 135–145)

## 2018-11-28 LAB — CBG MONITORING, ED: Glucose-Capillary: >600 mg/dL (ref 70–99)

## 2018-11-28 MED ORDER — INSULIN ASPART 100 UNIT/ML ~~LOC~~ SOLN
10.0000 [IU] | Freq: Once | SUBCUTANEOUS | Status: AC
  Administered 2018-11-28: 10 [IU] via INTRAVENOUS
  Filled 2018-11-28: qty 1

## 2018-11-28 MED ORDER — INSULIN GLARGINE 100 UNIT/ML SOLOSTAR PEN: 30.0000 [IU] | PEN_INJECTOR | Freq: Two times a day (BID) | SUBCUTANEOUS | 1 refills | Status: DC

## 2018-11-28 MED ORDER — BLOOD GLUCOSE MONITOR KIT: PACK | 1 refills | Status: DC

## 2018-11-28 MED ORDER — SODIUM CHLORIDE 0.9 % IV BOLUS
1000.0000 mL | Freq: Once | INTRAVENOUS | Status: AC
  Administered 2018-11-28: 15:00:00 1000 mL via INTRAVENOUS

## 2018-11-28 MED ORDER — INSULIN ASPART 100 UNIT/ML FLEXPEN: PEN_INJECTOR | SUBCUTANEOUS | 1 refills | Status: DC

## 2018-11-28 NOTE — ED Provider Notes (Signed)
[name] DEPT Provider Note   CSN: [id number] Arrival date & time: 11/28/18  1434    History   Chief Complaint Chief Complaint  Patient presents with  . Hyperglycemia    HPI [name] [name]. is a 18 y.o. male.     Patient is a 18 year old male with history of 1 diabetes, anxiety.  He presents today for evaluation of elevated blood sugar.  Patient states that he has had increased thirst and believes that his sugar may be high.  Patient reports that he has been taking his insulin, but does not have a glucometer to check his blood sugar.  He reports he drank "a lot of water" prior to coming here thinking that this may help his blood sugar.  The history is provided by the patient.  Hyperglycemia  Severity:  Unable to specify Timing:  Constant Progression:  Worsening Chronicity:  New Relieved by:  Nothing Ineffective treatments:  None tried   Past Medical History:  Diagnosis Date  . ADD (attention deficit disorder)   . Allergy   . Anxiety   . Asthma   . Depression   . Epileptic seizure ([place])    "both petit and grand mal; last sz ~ 2 wk ago" (06/17/2013)  . Headache(784.0)    "2-3 times/wk usually" (06/17/2013)  . Heart murmur    "heard a slight one earlier today" (06/17/2013)  . Homeless   . Migraine    "maybe once/month; it's severe" (06/17/2013)  . ODD (oppositional defiant disorder)   . Sickle cell trait ([place])   . Type I diabetes mellitus ([place])    "that's what they're thinking now" (06/17/2013)  . Vision abnormalities    "takes him longer to focus cause; from his sz" (06/17/2013)    Patient Active Problem List   Diagnosis Date Noted  . 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 AKI (acute kidney injury) ([place]) 09/15/2018  . URI (upper respiratory infection) 09/15/2018  . Adjustment disorder with mixed disturbance of emotions and conduct   . Atypical chest pain 08/19/2018  . Borderline personality disorder ([place])  02/26/2018  . MDD (major depressive disorder), recurrent, severe, with psychosis ([place]) 02/25/2018  . Major depressive disorder, recurrent severe without psychotic features ([place]) 07/24/2017  . DKA, type 1 ([place]) 07/16/2017  . Nausea & vomiting 07/16/2017  . Uncontrolled type 1 diabetes circulatory disorder erectile dysfunction ([place])   . DKA (diabetic ketoacidoses) ([place]) 12/18/2016  . History of seizures 12/01/2016  . History of migraine 12/01/2016  . Disordered eating 03/08/2015  . Ketonuria   . Adjustment reaction to medical therapy   . Non compliance w medication regimen   . Diabetic peripheral neuropathy associated with type 1 diabetes mellitus ([place]) 09/29/2014  . Dehydration 08/22/2014  . Hyperglycemia due to type 1 diabetes mellitus ([place]) 08/21/2014  . Type 1 diabetes mellitus with hyperglycemia ([place])   . Noncompliance   . Generalized abdominal pain   . Glycosuria   . Depression 07/12/2014  . Involuntary movements 06/13/2014  . Bilateral leg pain 06/13/2014  . 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 Suicidal ideations 03/31/2014  . ODD (oppositional defiant disorder) 03/03/2014  . MDD (major depressive disorder), recurrent episode, moderate ([place]) 02/16/2014  . Attention deficit hyperactivity disorder (ADHD), combined type, moderate 02/16/2014  . Microcytic anemia 02/15/2014  . Hyperglycemia 02/14/2014  . Goiter 02/04/2014  . 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 Acquired acanthosis nigricans 02/04/2014  .  Obesity, morbid ([place]) 02/04/2014  . Insulin resistance 02/04/2014  . Hyperinsulinemia 02/04/2014  . Hypoglycemia associated with diabetes ([place]) 02/02/2014  . Maladaptive health behaviors affecting medical condition 02/02/2014  . Hypoglycemia 02/02/2014  . Type 1 diabetes mellitus in patient 10 to 20 years of age with hemoglobin A1c goal of less than 7.5% ([place]) 01/26/2014  .  Partial epilepsy with impairment of consciousness ([place]) 12/13/2013  . 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 Migraine without aura 12/13/2013  . Body mass index, pediatric, greater than or equal to 95th percentile for age 15/16/2015  . Asthma 12/13/2013  . Hypoglycemia unawareness in type 1 diabetes mellitus ([place]) 08/08/2013  . Seizure disorder ([place]) 07/06/2013  . Short-term memory loss 07/06/2013  . Sickle cell trait ([place]) 07/06/2013  . Diabetes ([place]) 06/17/2013  . Diabetes mellitus, new onset ([place]) 06/17/2013    Past Surgical History:  Procedure Laterality Date  . CIRCUMCISION  2000  . FINGER SURGERY Left 2001   "crushed pinky; had to repair it" (06/17/2013)        Home Medications    Prior to Admission medications   Medication Sig Start Date End Date Taking? Authorizing Provider  albuterol (PROVENTIL HFA;VENTOLIN HFA) 108 (90 Base) MCG/ACT inhaler Inhale 1 puff into the lungs every 4 (four) hours as needed for wheezing or shortness of breath. Patient not taking: Reported on 10/26/2018 09/16/18   Jonetta Osgood, MD  blood glucose meter kit and supplies Dispense based on patient and insurance preference. Use up to four times daily as directed. (FOR ICD-10 E10.9, E11.9). 09/16/18   Ghimire, Henreitta Leber, MD  FLUoxetine (PROZAC) 20 MG capsule Take 1 capsule (20 mg total) by mouth daily. 09/20/18   Patrecia Pour, NP  glucose blood (FREESTYLE TEST STRIPS) test strip Use as instructed 09/16/18   Ghimire, Henreitta Leber, MD  insulin aspart (NOVOLOG FLEXPEN) 100 UNIT/ML FlexPen Inject 0-15 Units into the skin 3 (three) times daily with meals. In addition to 8 units given if you eat >50% of a meal 10/10/18   Kinnie Feil, PA-C  Insulin Glargine (LANTUS) 100 UNIT/ML Solostar Pen Inject 20 Units into the skin 2 (two) times daily. 10/10/18   Kinnie Feil, PA-C  Insulin Pen Needle 32G X 8 MM MISC Use as directed 10/10/18   Kinnie Feil, PA-C  traZODone (DESYREL) 100 MG  tablet Take 1 tablet (100 mg total) by mouth at bedtime as needed for sleep. 09/19/18   Patrecia Pour, NP    Family History Family History  Problem Relation Age of Onset  . Asthma Mother   . COPD Mother   . Other Mother        possible autoimmune, unclear  . Diabetes Maternal Grandmother   . Heart disease Maternal Grandmother   . Hypertension Maternal Grandmother   . Mental illness Maternal Grandmother   . Heart disease Maternal Grandfather   . Hyperlipidemia Paternal Grandmother   . Hyperlipidemia Paternal Grandfather   . Migraines Sister        Hemiplegic Migraines     Social History Social History   Tobacco Use  . Smoking status: Passive Smoke Exposure - Never Smoker  . Smokeless tobacco: Never Used  . Tobacco comment: Mom and dad smoke outside   Substance Use Topics  . Alcohol use: No    Alcohol/week: 0.0 standard drinks  . Drug use: Yes    Types: Methamphetamines     Allergies   Bee venom; Fish allergy; Shellfish allergy;  and Tea   Review of Systems Review of Systems  All other systems reviewed and are negative.    Physical Exam Updated Vital Signs BP (!) 151/103 (BP Location: Left Arm)   Pulse (!) 108   Temp 98 F (36.7 C) (Oral)   Resp 16   Ht 5' 10.5" (1.791 m)   Wt 88.4 kg   SpO2 98%   BMI 27.57 kg/m   Physical Exam Vitals signs and nursing note reviewed.  Constitutional:      General: He is not in acute distress.    Appearance: He is well-developed. He is not diaphoretic.  HENT:     Head: Normocephalic and atraumatic.  Neck:     Musculoskeletal: Normal range of motion and neck supple.  Cardiovascular:     Rate and Rhythm: Normal rate and regular rhythm.     Heart sounds: No murmur. No friction rub.  Pulmonary:     Effort: Pulmonary effort is normal. No respiratory distress.     Breath sounds: Normal breath sounds. No wheezing or rales.  Abdominal:     General: Bowel sounds are normal. There is no distension.     Palpations:  Abdomen is soft.     Tenderness: There is no abdominal tenderness.  Musculoskeletal: Normal range of motion.  Skin:    General: Skin is warm and dry.  Neurological:     Mental Status: He is alert and oriented to person, place, and time.     Coordination: Coordination normal.      ED Treatments / Results  Labs (all labs ordered are listed, but only abnormal results are displayed) Labs Reviewed  CBG MONITORING, ED - Abnormal; Notable for the following components:      Result Value   Glucose-Capillary >600 (*)    All other components within normal limits  BASIC METABOLIC PANEL  CBC  URINALYSIS, ROUTINE W REFLEX MICROSCOPIC  CBG MONITORING, ED    EKG None  Radiology No results found.  Procedures Procedures (including critical care time)  Medications Ordered in ED Medications  sodium chloride 0.9 % bolus 1,000 mL (has no administration in time range)     Initial Impression / Assessment and Plan / ED Course  I have reviewed the triage vital signs and the nursing notes.  Pertinent labs & imaging results that were available during my care of the patient were reviewed by me and considered in my medical decision making (see chart for details).  Patient is a type I diabetic with minimal compliance presenting with complaints of elevated blood sugar and not feeling well.  His blood sugar is over 700, however is not in DKA.  He was given an IV dose of insulin as well as a liter of normal saline.  Shortly after the insulin and fluids were given, the patient was insistent upon leaving.  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  Patient was given prescriptions for his insulin and a glucometer prior to leaving.  Patient understands that I suggest that he stay longer, however this was not possible.  Final Clinical Impressions(s) / ED Diagnoses   Final diagnoses:  None    ED Discharge Orders    None       [name] Speak, MD 11/28/18 [phone]

## 2018-11-28 NOTE — Discharge Instructions (Addendum)
Resume insulin as previously prescribed.  Keep a record of your blood sugar several times daily.  Follow-up with a primary doctor for follow-up of your blood sugars.

## 2018-11-28 NOTE — ED Triage Notes (Signed)
Pt states he feels like his blood sugar is high, has not been able to check in it in about 4 months due to not machine. Thirst, frequent urination

## 2018-11-28 NOTE — ED Notes (Signed)
Dr. Judd Lien spent much time advising pt. Of the ramifications of having a blood sugar this elevated and recommends strongly that he stay. Pt. Tells [place] he has a most pressing engagement involving his employment and in any case must "leave now". He is alert and in on distress and ambulates without difficulty.

## 2018-12-04 ENCOUNTER — Other Ambulatory Visit: Payer: Self-pay

## 2018-12-04 ENCOUNTER — Emergency Department (HOSPITAL_COMMUNITY)
Admission: EM | Admit: 2018-12-04 | Discharge: 2018-12-05 | Discharge status: Against Medical Advice | Payer: Medicaid Other | Attending: Emergency Medicine | Admitting: Emergency Medicine

## 2018-12-04 ENCOUNTER — Encounter (HOSPITAL_COMMUNITY): Payer: Self-pay

## 2018-12-04 DIAGNOSIS — E1040 Type 1 diabetes mellitus with diabetic neuropathy, unspecified: Secondary | ICD-10-CM | POA: Insufficient documentation

## 2018-12-04 DIAGNOSIS — Z79899 Other long term (current) drug therapy: Secondary | ICD-10-CM | POA: Diagnosis not present

## 2018-12-04 DIAGNOSIS — Z7722 Contact with and (suspected) exposure to environmental tobacco smoke (acute) (chronic): Secondary | ICD-10-CM | POA: Diagnosis not present

## 2018-12-04 DIAGNOSIS — Z794 Long term (current) use of insulin: Secondary | ICD-10-CM | POA: Insufficient documentation

## 2018-12-04 DIAGNOSIS — Z5320 Procedure and treatment not carried out because of patient's decision for unspecified reasons: Secondary | ICD-10-CM | POA: Insufficient documentation

## 2018-12-04 DIAGNOSIS — E1065 Type 1 diabetes mellitus with hyperglycemia: Secondary | ICD-10-CM | POA: Diagnosis not present

## 2018-12-04 DIAGNOSIS — M79645 Pain in left finger(s): Secondary | ICD-10-CM | POA: Insufficient documentation

## 2018-12-04 DIAGNOSIS — R739 Hyperglycemia, unspecified: Secondary | ICD-10-CM | POA: Diagnosis present

## 2018-12-04 DIAGNOSIS — F1510 Other stimulant abuse, uncomplicated: Secondary | ICD-10-CM | POA: Insufficient documentation

## 2018-12-04 LAB — CBG MONITORING, ED: Glucose-Capillary: >600 mg/dL (ref 70–99)

## 2018-12-04 NOTE — ED Triage Notes (Signed)
Pt reports that he is here for hyperglycemia. States that his sugar read "HIGH" this morning when he took it. Denies N/V. A&Ox4.

## 2018-12-05 LAB — BASIC METABOLIC PANEL
Anion gap: 9 (ref 5–15)
BUN: 18 mg/dL (ref 6–20)
CO2: 23 mmol/L (ref 22–32)
Calcium: 8.7 mg/dL — ABNORMAL LOW (ref 8.9–10.3)
Chloride: 93 mmol/L — ABNORMAL LOW (ref 98–111)
Creatinine, Ser: 0.89 mg/dL (ref 0.61–1.24)
GFR calc Af Amer: >60 mL/min (ref >60)
GFR calc non Af Amer: >60 mL/min (ref >60)
Glucose, Bld: 794 mg/dL (ref 70–99)
Potassium: 4.3 mmol/L (ref 3.5–5.1)
Sodium: 125 mmol/L — ABNORMAL LOW (ref 135–145)

## 2018-12-05 LAB — URINALYSIS, ROUTINE W REFLEX MICROSCOPIC
Bacteria, UA: NONE SEEN
Bilirubin Urine: NEGATIVE
Glucose, UA: >=500 mg/dL — AB
Hgb urine dipstick: NEGATIVE
Ketones, ur: NEGATIVE mg/dL
Leukocytes,Ua: NEGATIVE
Nitrite: NEGATIVE
Protein, ur: NEGATIVE mg/dL
Specific Gravity, Urine: 1.022 (ref 1.005–1.030)
pH: 6 (ref 5.0–8.0)

## 2018-12-05 LAB — CBC
HCT: 42.7 % (ref 39.0–52.0)
Hemoglobin: 14.2 g/dL (ref 13.0–17.0)
MCH: 28 pg (ref 26.0–34.0)
MCHC: 33.3 g/dL (ref 30.0–36.0)
MCV: 84.1 fL (ref 80.0–100.0)
Platelets: 219 10*3/uL (ref 150–400)
RBC: 5.08 MIL/uL (ref 4.22–5.81)
RDW: 11.9 % (ref 11.5–15.5)
WBC: 6 10*3/uL (ref 4.0–10.5)
nRBC: 0 % (ref 0.0–0.2)

## 2018-12-05 MED ORDER — SODIUM CHLORIDE 0.9 % IV BOLUS
1000.0000 mL | Freq: Once | INTRAVENOUS | Status: AC
  Administered 2018-12-05: 1000 mL via INTRAVENOUS

## 2018-12-05 MED ORDER — INSULIN ASPART 100 UNIT/ML ~~LOC~~ SOLN
10.0000 [IU] | Freq: Once | SUBCUTANEOUS | Status: AC
  Administered 2018-12-05: 10 [IU] via INTRAVENOUS
  Filled 2018-12-05: qty 1

## 2018-12-05 NOTE — ED Notes (Signed)
Date and time results received: 12/05/18 0041 (use smartphrase ".now" to insert current time)  Test:  Glucose Critical Value: 794  Name of Provider Notified:Dr. horton  Orders Received? Or Actions Taken?: waiting on orders.

## 2018-12-05 NOTE — ED Provider Notes (Signed)
[place] DEPT Provider Note   CSN: [id number] Arrival date & time: 12/04/18  2304    History   Chief Complaint Chief Complaint  Patient presents with  . Hyperglycemia    HPI [name] [name]. is a 18 y.o. male.     HPI  This is a 18 year old male with a history of type 1 diabetes, DKA, asthma who presents with hyperglycemia.  Patient reports that his blood sugar has been reading "high" all day.  He states that yesterday his blood sugars were in the 120s.  He reports compliance with his insulin including Lantus and his short acting insulin.  He reports he has had minimal p.o. intake today and has taken multiple doses of short acting insulin for coverage.  He denies any fever, cough, chest pain, shortness breath, abdominal pain, nausea, vomiting.  He does report some persistent pain in his left second digit from recent injury.  No purulence from the wound.  Past Medical History:  Diagnosis Date  . ADD (attention deficit disorder)   . Allergy   . Anxiety   . Asthma   . Depression   . Epileptic seizure ([place])    "both petit and grand mal; last sz ~ 2 wk ago" (06/17/2013)  . Headache(784.0)    "2-3 times/wk usually" (06/17/2013)  . Heart murmur    "heard a slight one earlier today" (06/17/2013)  . Homeless   . Migraine    "maybe once/month; it's severe" (06/17/2013)  . ODD (oppositional defiant disorder)   . Sickle cell trait ([place])   . Type I diabetes mellitus ([place])    "that's what they're thinking now" (06/17/2013)  . Vision abnormalities    "takes him longer to focus cause; from his sz" (06/17/2013)    Patient Active Problem List   Diagnosis Date Noted  . 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 AKI (acute kidney injury) ([place]) 09/15/2018  . URI (upper respiratory infection) 09/15/2018  . Adjustment disorder with mixed disturbance of emotions and conduct   . Atypical chest pain 08/19/2018  . Borderline personality disorder  ([place]) 02/26/2018  . 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 Major depressive disorder, recurrent severe without psychotic features ([place]) 07/24/2017  . DKA, type 1 ([place]) 07/16/2017  . Nausea & vomiting 07/16/2017  . Uncontrolled type 1 diabetes circulatory disorder erectile dysfunction ([place])   . DKA (diabetic ketoacidoses) ([place]) 12/18/2016  . History of seizures 12/01/2016  . History of migraine 12/01/2016  . Disordered eating 03/08/2015  . Ketonuria   . Adjustment reaction to medical therapy   . Non compliance w medication regimen   . Diabetic peripheral neuropathy associated with type 1 diabetes mellitus ([place]) 09/29/2014  . Dehydration 08/22/2014  . Hyperglycemia due to type 1 diabetes mellitus ([place]) 08/21/2014  . Type 1 diabetes mellitus with hyperglycemia ([place])   . Noncompliance   . Generalized abdominal pain   . Glycosuria   . Depression 07/12/2014  . Involuntary movements 06/13/2014  . Bilateral leg pain 06/13/2014  . 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 Suicidal ideations 03/31/2014  . ODD (oppositional defiant disorder) 03/03/2014  . MDD (major depressive disorder), recurrent episode, moderate ([place]) 02/16/2014  . Attention deficit hyperactivity disorder (ADHD), combined type, moderate 02/16/2014  . Microcytic anemia 02/15/2014  . Hyperglycemia 02/14/2014  . Goiter 02/04/2014  . 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[place]) 02/04/2014  . Acquired acanthosis nigricans 02/04/2014  .  Obesity, morbid ([place]) 02/04/2014  . Insulin resistance 02/04/2014  . Hyperinsulinemia 02/04/2014  . Hypoglycemia associated with diabetes ([place]) 02/02/2014  . Maladaptive health behaviors affecting medical condition 02/02/2014  . Hypoglycemia 02/02/2014  . Type 1 diabetes mellitus in patient 18 to 20 years of age with hemoglobin A1c goal of less than 7.5% ([place]) 01/26/2014   . Partial epilepsy with impairment of consciousness ([place]) 12/13/2013  . 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 Migraine without aura 12/13/2013  . Body mass index, pediatric, greater than or equal to 95th percentile for age 69/16/2015  . Asthma 12/13/2013  . Hypoglycemia unawareness in type 1 diabetes mellitus ([place]) 08/08/2013  . Seizure disorder ([place]) 07/06/2013  . Short-term memory loss 07/06/2013  . Sickle cell trait ([place]) 07/06/2013  . Diabetes ([place]) 06/17/2013  . Diabetes mellitus, new onset ([place]) 06/17/2013    Past Surgical History:  Procedure Laterality Date  . CIRCUMCISION  2000  . FINGER SURGERY Left 2001   "crushed pinky; had to repair it" (06/17/2013)        Home Medications    Prior to Admission medications   Medication Sig Start Date End Date Taking? Authorizing Provider  albuterol (PROVENTIL HFA;VENTOLIN HFA) 108 (90 Base) MCG/ACT inhaler Inhale 1 puff into the lungs every 4 (four) hours as needed for wheezing or shortness of breath. Patient not taking: Reported on 10/26/2018 09/16/18   Jonetta Osgood, MD  blood glucose meter kit and supplies KIT Dispense based on patient and insurance preference. Use up to four times daily as directed. (FOR ICD-9 250.00, 250.01). 11/28/18   [name] Speak, MD  blood glucose meter kit and supplies Dispense based on patient and insurance preference. Use up to four times daily as directed. (FOR ICD-10 E10.9, E11.9). 09/16/18   Ghimire, Henreitta Leber, MD  FLUoxetine (PROZAC) 20 MG capsule Take 1 capsule (20 mg total) by mouth daily. 09/20/18   Patrecia Pour, NP  glucose blood (FREESTYLE TEST STRIPS) test strip Use as instructed 09/16/18   Ghimire, Henreitta Leber, MD  insulin aspart (NOVOLOG FLEXPEN) 100 UNIT/ML FlexPen 1 unit/g of carbohydrate sliding scale. 11/28/18   [name] Speak, MD  Insulin Glargine (LANTUS) 100 UNIT/ML Solostar Pen Inject 30 Units into the skin 2 (two) times daily. 11/28/18   [name] Speak, MD  Insulin Pen Needle  32G X 8 MM MISC Use as directed 10/10/18   Kinnie Feil, PA-C  traZODone (DESYREL) 100 MG tablet Take 1 tablet (100 mg total) by mouth at bedtime as needed for sleep. 09/19/18   Patrecia Pour, NP    Family History Family History  Problem Relation Age of Onset  . Asthma Mother   . COPD Mother   . Other Mother        possible autoimmune, unclear  . Diabetes Maternal Grandmother   . Heart disease Maternal Grandmother   . Hypertension Maternal Grandmother   . Mental illness Maternal Grandmother   . Heart disease Maternal Grandfather   . Hyperlipidemia Paternal Grandmother   . Hyperlipidemia Paternal Grandfather   . Migraines Sister        Hemiplegic Migraines     Social History Social History   Tobacco Use  . Smoking status: Passive Smoke Exposure - Never Smoker  . Smokeless tobacco: Never Used  . Tobacco comment: Mom and dad smoke outside   Substance Use Topics  . Alcohol use: No    Alcohol/week: 0.0 standard drinks  . Drug use: Yes    Types: Methamphetamines  Allergies   Bee venom; Fish allergy; Shellfish allergy; and Tea   Review of Systems Review of Systems  Constitutional: Negative for fever.  Respiratory: Negative for shortness of breath.   Cardiovascular: Negative for chest pain.  Gastrointestinal: Negative for abdominal pain, nausea and vomiting.  Genitourinary: Negative for dysuria.  Musculoskeletal:       Finger pain  Skin: Positive for wound.  Neurological: Negative for headaches.  All other systems reviewed and are negative.    Physical Exam Updated Vital Signs BP 122/67   Pulse 98   Temp 98.3 F (36.8 C) (Oral)   Resp 16   SpO2 94%   Physical Exam Vitals signs and nursing note reviewed.  Constitutional:      Appearance: He is well-developed.  HENT:     Head: Normocephalic and atraumatic.     Mouth/Throat:     Mouth: Mucous membranes are dry.  Eyes:     Pupils: Pupils are equal, round, and reactive to light.  Neck:      Musculoskeletal: Neck supple.  Cardiovascular:     Rate and Rhythm: Normal rate and regular rhythm.     Heart sounds: Normal heart sounds. No murmur.  Pulmonary:     Effort: Pulmonary effort is normal. No respiratory distress.     Breath sounds: Normal breath sounds. No wheezing.  Abdominal:     General: Bowel sounds are normal.     Palpations: Abdomen is soft.     Tenderness: There is no abdominal tenderness. There is no rebound.  Musculoskeletal:     Comments: Focused examination of the left second digit with wound along the radial aspect of the digit between the PIP and DIP joint, slight tenderness to palpation, no fluctuance, no significant erythema, intact flexion and extension  Skin:    General: Skin is warm and dry.  Neurological:     Mental Status: He is alert and oriented to person, place, and time.  Psychiatric:        Mood and Affect: Mood normal.      ED Treatments / Results  Labs (all labs ordered are listed, but only abnormal results are displayed) Labs Reviewed  BASIC METABOLIC PANEL - Abnormal; Notable for the following components:      Result Value   Sodium 125 (*)    Chloride 93 (*)    Glucose, Bld 794 (*)    Calcium 8.7 (*)    All other components within normal limits  URINALYSIS, ROUTINE W REFLEX MICROSCOPIC - Abnormal; Notable for the following components:   Color, Urine COLORLESS (*)    Glucose, UA >=500 (*)    All other components within normal limits  CBG MONITORING, ED - Abnormal; Notable for the following components:   Glucose-Capillary >600 (*)    All other components within normal limits  CBC    EKG None  Radiology No results found.  Procedures Procedures (including critical care time)  CRITICAL CARE Performed by: Merryl Hacker   Total critical care time: 31 minutes  Critical care time was exclusive of separately billable procedures and treating other patients.  Critical care was necessary to treat or prevent imminent or  life-threatening deterioration.  Critical care was time spent personally by me on the following activities: development of treatment plan with patient and/or surrogate as well as nursing, discussions with consultants, evaluation of patient's response to treatment, examination of patient, obtaining history from patient or surrogate, ordering and performing treatments and interventions, ordering and review of laboratory  studies, ordering and review of radiographic studies, pulse oximetry and re-evaluation of patient's condition.   Medications Ordered in ED Medications  sodium chloride 0.9 % bolus 1,000 mL (1,000 mLs Intravenous New Bag/Given 12/05/18 0006)  insulin aspart (novoLOG) injection 10 Units (10 Units Intravenous Given 12/05/18 0109)     Initial Impression / Assessment and Plan / ED Course  I have reviewed the triage vital signs and the nursing notes.  Pertinent labs & imaging results that were available during my care of the patient were reviewed by me and considered in my medical decision making (see chart for details).  Clinical Course as of Dec 05 122  Sun Dec 05, 2018  0123 I was called to the patient's room.  Requesting to leave [facility].  He reports that his probation officer is trying to arrest him because he is supposed to be at home.  He also reports that his sister is locked out of the house.  He is severely hyperglycemic but no evidence of DKA.  He has a history of DKA.  I discussed with him that if he develops DKA this can be life-threatening.  Patient states understanding and understands the risks of leaving AGAINST MEDICAL ADVICE.   [CH]    Clinical Course User Index [CH] Ermalinda Joubert, Barbette Hair, MD       Patient presents with hyperglycemia.  History of the same.  He is overall nontoxic-appearing vital signs are reassuring.  Patient was given fluids and lab tests were ordered.  Glucose is greater than 700 but no evidence of DKA.  Patient was given 10 units of  insulin.  See clinical course above.  Ultimately, patient signed out [place].  Final Clinical Impressions(s) / ED Diagnoses   Final diagnoses:  Hyperglycemia    ED Discharge Orders    None       Dalanie Kisner, Barbette Hair, MD 12/05/18 [phone]

## 2018-12-05 NOTE — ED Notes (Signed)
Pt is requesting to leave AMA. Reviewed risk with patient and he verbalizes understanding.

## 2018-12-17 ENCOUNTER — Other Ambulatory Visit: Payer: Self-pay

## 2018-12-17 ENCOUNTER — Encounter (HOSPITAL_COMMUNITY): Payer: Self-pay

## 2018-12-17 DIAGNOSIS — Z5321 Procedure and treatment not carried out due to patient leaving prior to being seen by health care provider: Secondary | ICD-10-CM | POA: Insufficient documentation

## 2018-12-17 DIAGNOSIS — Z794 Long term (current) use of insulin: Secondary | ICD-10-CM | POA: Diagnosis not present

## 2018-12-17 DIAGNOSIS — E1165 Type 2 diabetes mellitus with hyperglycemia: Secondary | ICD-10-CM | POA: Diagnosis present

## 2018-12-17 LAB — CBG MONITORING, ED: Glucose-Capillary: >600 mg/dL (ref 70–99)

## 2018-12-17 NOTE — ED Triage Notes (Signed)
Pt reports that he felt dizzy at work earlier today and checked his sugar and it read high. Reports taking 15units of Novolog and 35units of Lantus PTA. Denies N/V at this time.

## 2018-12-18 ENCOUNTER — Emergency Department (HOSPITAL_COMMUNITY)
Admission: EM | Admit: 2018-12-18 | Discharge: 2018-12-18 | Disposition: A | Discharge status: Home / Self Care | Payer: Medicaid Other | Attending: Emergency Medicine | Admitting: Emergency Medicine

## 2018-12-18 MED ORDER — SODIUM CHLORIDE 0.9 % IV BOLUS: 1000.0000 mL | Freq: Once | INTRAVENOUS | Status: DC

## 2018-12-18 NOTE — ED Notes (Signed)
Pt did not answer when called for a room in the lobby, men's bathroom, or out in front of the ED.

## 2018-12-18 NOTE — ED Notes (Signed)
No answer when called for a room. 

## 2018-12-18 NOTE — ED Notes (Signed)
Pt called 3 times for room placement. No answer from lobby and pt not visualized in lobby or restroom.

## 2019-01-31 LAB — BLOOD GAS, VENOUS
Acid-base deficit: 11.7 mmol/L — ABNORMAL HIGH (ref 0.0–2.0)
Bicarbonate: 14.4 mmol/L — ABNORMAL LOW (ref 20.0–28.0)
FIO2: 21
pCO2, Ven: 35.9 mmHg — ABNORMAL LOW (ref 44.0–60.0)
pH, Ven: 7.226 — ABNORMAL LOW (ref 7.250–7.430)

## 2019-02-07 ENCOUNTER — Observation Stay (HOSPITAL_COMMUNITY)
Admission: EM | Admit: 2019-02-07 | Discharge: 2019-02-07 | Discharge status: Against Medical Advice | Payer: Medicaid Other | Attending: Internal Medicine | Admitting: Internal Medicine

## 2019-02-07 ENCOUNTER — Emergency Department (HOSPITAL_COMMUNITY): Payer: Medicaid Other

## 2019-02-07 ENCOUNTER — Encounter (HOSPITAL_COMMUNITY): Payer: Self-pay | Admitting: Emergency Medicine

## 2019-02-07 ENCOUNTER — Other Ambulatory Visit: Payer: Self-pay

## 2019-02-07 DIAGNOSIS — F1590 Other stimulant use, unspecified, uncomplicated: Secondary | ICD-10-CM | POA: Diagnosis not present

## 2019-02-07 DIAGNOSIS — Z79899 Other long term (current) drug therapy: Secondary | ICD-10-CM | POA: Insufficient documentation

## 2019-02-07 DIAGNOSIS — Z833 Family history of diabetes mellitus: Secondary | ICD-10-CM | POA: Diagnosis not present

## 2019-02-07 DIAGNOSIS — R079 Chest pain, unspecified: Secondary | ICD-10-CM | POA: Diagnosis present

## 2019-02-07 DIAGNOSIS — E1043 Type 1 diabetes mellitus with diabetic autonomic (poly)neuropathy: Secondary | ICD-10-CM | POA: Diagnosis not present

## 2019-02-07 DIAGNOSIS — D573 Sickle-cell trait: Secondary | ICD-10-CM | POA: Insufficient documentation

## 2019-02-07 DIAGNOSIS — Z7722 Contact with and (suspected) exposure to environmental tobacco smoke (acute) (chronic): Secondary | ICD-10-CM | POA: Diagnosis not present

## 2019-02-07 DIAGNOSIS — E1042 Type 1 diabetes mellitus with diabetic polyneuropathy: Secondary | ICD-10-CM | POA: Diagnosis not present

## 2019-02-07 DIAGNOSIS — R000 Tachycardia, unspecified: Secondary | ICD-10-CM | POA: Insufficient documentation

## 2019-02-07 DIAGNOSIS — Z794 Long term (current) use of insulin: Secondary | ICD-10-CM | POA: Insufficient documentation

## 2019-02-07 DIAGNOSIS — E1110 Type 2 diabetes mellitus with ketoacidosis without coma: Secondary | ICD-10-CM | POA: Diagnosis present

## 2019-02-07 DIAGNOSIS — Z9119 Patient's noncompliance with other medical treatment and regimen: Secondary | ICD-10-CM | POA: Insufficient documentation

## 2019-02-07 DIAGNOSIS — E1010 Type 1 diabetes mellitus with ketoacidosis without coma: Secondary | ICD-10-CM

## 2019-02-07 DIAGNOSIS — E10649 Type 1 diabetes mellitus with hypoglycemia without coma: Secondary | ICD-10-CM | POA: Insufficient documentation

## 2019-02-07 LAB — POCT I-STAT EG7
Acid-base deficit: 2 mmol/L (ref 0.0–2.0)
Bicarbonate: 21.6 mmol/L (ref 20.0–28.0)
Calcium, Ion: 1.15 mmol/L (ref 1.15–1.40)
HCT: 48 % (ref 39.0–52.0)
Hemoglobin: 16.3 g/dL (ref 13.0–17.0)
O2 Saturation: 91 %
Potassium: 5.6 mmol/L — ABNORMAL HIGH (ref 3.5–5.1)
Sodium: 126 mmol/L — ABNORMAL LOW (ref 135–145)
TCO2: 23 mmol/L (ref 22–32)
pCO2, Ven: 32.8 mmHg — ABNORMAL LOW (ref 44.0–60.0)
pH, Ven: 7.426 (ref 7.250–7.430)
pO2, Ven: 59 mmHg — ABNORMAL HIGH (ref 32.0–45.0)

## 2019-02-07 LAB — BASIC METABOLIC PANEL
Anion gap: 19 — ABNORMAL HIGH (ref 5–15)
BUN: 11 mg/dL (ref 6–20)
CO2: 17 mmol/L — ABNORMAL LOW (ref 22–32)
Calcium: 9.4 mg/dL (ref 8.9–10.3)
Chloride: 89 mmol/L — ABNORMAL LOW (ref 98–111)
Creatinine, Ser: 1.09 mg/dL (ref 0.61–1.24)
GFR calc Af Amer: >60 mL/min (ref >60)
GFR calc non Af Amer: >60 mL/min (ref >60)
Glucose, Bld: 671 mg/dL (ref 70–99)
Potassium: 4.5 mmol/L (ref 3.5–5.1)
Sodium: 125 mmol/L — ABNORMAL LOW (ref 135–145)

## 2019-02-07 LAB — CBC
HCT: 47.9 % (ref 39.0–52.0)
Hemoglobin: 16.3 g/dL (ref 13.0–17.0)
MCH: 27.3 pg (ref 26.0–34.0)
MCHC: 34 g/dL (ref 30.0–36.0)
MCV: 80.2 fL (ref 80.0–100.0)
Platelets: 324 10*3/uL (ref 150–400)
RBC: 5.97 MIL/uL — ABNORMAL HIGH (ref 4.22–5.81)
RDW: 11.9 % (ref 11.5–15.5)
WBC: 11.6 10*3/uL — ABNORMAL HIGH (ref 4.0–10.5)
nRBC: 0 % (ref 0.0–0.2)

## 2019-02-07 LAB — CBG MONITORING, ED
Glucose-Capillary: 500 mg/dL — ABNORMAL HIGH (ref 70–99)
Glucose-Capillary: 387 mg/dL — ABNORMAL HIGH (ref 70–99)

## 2019-02-07 LAB — TROPONIN I (HIGH SENSITIVITY): Troponin I (High Sensitivity): 3 ng/L (ref <18)

## 2019-02-07 MED ORDER — DEXTROSE-NACL 5-0.45 % IV SOLN: INTRAVENOUS | Status: DC

## 2019-02-07 MED ORDER — INSULIN REGULAR(HUMAN) IN NACL 100-0.9 UT/100ML-% IV SOLN
INTRAVENOUS | Status: DC
  Administered 2019-02-07: 5.4 [IU]/h via INTRAVENOUS
  Filled 2019-02-07: qty 100

## 2019-02-07 MED ORDER — SODIUM CHLORIDE 0.9 % IV BOLUS
2000.0000 mL | Freq: Once | INTRAVENOUS | Status: AC
  Administered 2019-02-07: 2000 mL via INTRAVENOUS

## 2019-02-07 MED ORDER — SODIUM CHLORIDE 0.9% FLUSH
3.0000 mL | Freq: Once | INTRAVENOUS | Status: AC
  Administered 2019-02-07: 3 mL via INTRAVENOUS

## 2019-02-07 NOTE — ED Triage Notes (Signed)
Pt states he has 5/10 mid cp that started to night per pt he believe that he took to much meth and after he took one Ativan.

## 2019-02-07 NOTE — ED Provider Notes (Addendum)
Briefly, the patient is a 18 y.o. male with h/o DM, here for chest pain s/p meth use.   Vitals:   02/07/19 0321 02/07/19 0322  BP:    Pulse: (!) 113 (!) 112  Resp: 20 15  Temp:    SpO2: 97% 97%    CONSTITUTIONAL:  ill-appearing, NAD NEURO:  Alert and oriented x 3, no focal deficits EYES:  pupils equal and reactive ENT/NECK:  trachea midline, no JVD CARDIO:  tachy rate, reg rhythm, well-perfused PULM:  Non-labored breathing GI/GU:  Abdomin non-distended MSK/SPINE:  No gross deformities, no edema SKIN:  no rash, atraumatic PSYCH:  Appropriate speech and behavior  Work up consistent with DKA. EKG reassuring. Chest x-ray without evidence suggestive of pneumonia, pneumothorax, pneumomediastinum.  No abnormal contour of the mediastinum to suggest dissection. No evidence of acute injuries.  DKA protocol. Admitted for further management.  CRITICAL CARE Performed by: Grayce Sessions [place] Total critical care time: 10 minutes Critical care time was exclusive of separately billable procedures and treating other patients. Critical care was necessary to treat or prevent imminent or life-threatening deterioration. Critical care was time spent personally by me on the following activities: development of treatment plan with patient and/or surrogate as well as nursing, discussions with consultants, evaluation of patient's response to treatment, examination of patient, obtaining history from patient or surrogate, ordering and performing treatments and interventions, ordering and review of laboratory studies, ordering and review of radiographic studies, pulse oximetry and re-evaluation of patient's condition.        Fatima Blank, MD 02/07/19 [phone]   Patient left AMA. Date: 02/07/2019 Patient: [name] [name].  [name] or his authorized caregiver has made the decision for the patient to leave the emergency department against the advice of No att. providers found. He or  his authorized caregiver has been informed about any test results if available and understands the inherent risks of leaving, including permanent disability or death. He or his authorized caregiver has decided to accept the responsibility for this decision. He or his authorized caregiver is not intoxicated and has demonstrated decision making capacity. [name] and all necessary parties have been advised that he may return for further evaluation or treatment at any time and they are recommended alternative follow up if continually unwilling. His condition at time of discharge was Fair.  Vital signs Blood pressure (!) 151/100, pulse (!) 112, temperature 98.1 F (36.7 C), temperature source Oral, resp. rate 18, height 5\' 10"  (1.778 m), weight 90.7 kg, SpO2 97 %.        Fatima Blank, MD 02/07/19 [phone]

## 2019-02-07 NOTE — ED Provider Notes (Signed)
[facility] Provider Note   CSN: [id number] Arrival date & time: 02/07/19  0207     History   Chief Complaint Chief Complaint  Patient presents with  . Chest Pain    HPI [name] [name]. is a 18 y.o. male.     Patient to ED with complaint of chest pain for the past 4 days. He reports doing meth and when he does, he develops pain in the left side of his chest. No difficulty breathing. He denies N, V, diaphoresis. No fever. He has a history of Type 1 DM, chronic noncompliance with treatment. He is a daily smoker.   The history is provided by the patient. No language interpreter was used.  Chest Pain Associated symptoms: no diaphoresis, no fever, no nausea, no shortness of breath and no vomiting     Past Medical History:  Diagnosis Date  . ADD (attention deficit disorder)   . Allergy   . Anxiety   . Asthma   . Depression   . Epileptic seizure ([place])    "both petit and grand mal; last sz ~ 2 wk ago" (06/17/2013)  . Headache(784.0)    "2-3 times/wk usually" (06/17/2013)  . Heart murmur    "heard a slight one earlier today" (06/17/2013)  . Homeless   . Migraine    "maybe once/month; it's severe" (06/17/2013)  . ODD (oppositional defiant disorder)   . Sickle cell trait ([place])   . Type I diabetes mellitus ([place])    "that's what they're thinking now" (06/17/2013)  . Vision abnormalities    "takes him longer to focus cause; from his sz" (06/17/2013)    Patient Active Problem List   Diagnosis Date Noted  . 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 AKI (acute kidney injury) ([place]) 09/15/2018  . URI (upper respiratory infection) 09/15/2018  . Adjustment disorder with mixed disturbance of emotions and conduct   . Atypical chest pain 08/19/2018  . Borderline personality disorder ([place]) 02/26/2018  . 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 Major depressive disorder, recurrent severe  without psychotic features ([place]) 07/24/2017  . DKA, type 1 ([place]) 07/16/2017  . Nausea & vomiting 07/16/2017  . Uncontrolled type 1 diabetes circulatory disorder erectile dysfunction ([place])   . DKA (diabetic ketoacidoses) ([place]) 12/18/2016  . History of seizures 12/01/2016  . History of migraine 12/01/2016  . Disordered eating 03/08/2015  . Ketonuria   . Adjustment reaction to medical therapy   . Non compliance w medication regimen   . Diabetic peripheral neuropathy associated with type 1 diabetes mellitus ([place]) 09/29/2014  . Dehydration 08/22/2014  . Hyperglycemia due to type 1 diabetes mellitus ([place]) 08/21/2014  . Type 1 diabetes mellitus with hyperglycemia ([place])   . Noncompliance   . Generalized abdominal pain   . Glycosuria   . Depression 07/12/2014  . Involuntary movements 06/13/2014  . Bilateral leg pain 06/13/2014  . 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 Suicidal ideations 03/31/2014  . ODD (oppositional defiant disorder) 03/03/2014  . MDD (major depressive disorder), recurrent episode, moderate ([place]) 02/16/2014  . Attention deficit hyperactivity disorder (ADHD), combined type, moderate 02/16/2014  . Microcytic anemia 02/15/2014  . Hyperglycemia 02/14/2014  . Goiter 02/04/2014  . 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[place]) 02/04/2014  . Acquired acanthosis nigricans 02/04/2014  . Obesity, morbid ([place]) 02/04/2014  . Insulin resistance 02/04/2014  . Hyperinsulinemia 02/04/2014  . Hypoglycemia associated with  diabetes ([place]) 02/02/2014  . Maladaptive health behaviors affecting medical condition 02/02/2014  . Hypoglycemia 02/02/2014  . Type 1 diabetes mellitus in patient 13 to 20 years of age with hemoglobin A1c goal of less than 7.5% ([place]) 01/26/2014  . Partial epilepsy with impairment of consciousness ([place]) 12/13/2013  . 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 Migraine without aura  12/13/2013  . Body mass index, pediatric, greater than or equal to 95th percentile for age 86/16/2015  . Asthma 12/13/2013  . Hypoglycemia unawareness in type 1 diabetes mellitus ([place]) 08/08/2013  . Seizure disorder ([place]) 07/06/2013  . Short-term memory loss 07/06/2013  . Sickle cell trait ([place]) 07/06/2013  . Diabetes ([place]) 06/17/2013  . Diabetes mellitus, new onset ([place]) 06/17/2013    Past Surgical History:  Procedure Laterality Date  . CIRCUMCISION  2000  . FINGER SURGERY Left 2001   "crushed pinky; had to repair it" (06/17/2013)        Home Medications    Prior to Admission medications   Medication Sig Start Date End Date Taking? Authorizing Provider  ARIPiprazole (ABILIFY) 5 MG tablet Take 5 mg by mouth daily. 02/05/19  Yes [provider]  buPROPion (WELLBUTRIN SR) 150 MG 12 hr tablet Take 150 mg by mouth daily. 02/05/19  Yes [provider]  FLUoxetine (PROZAC) 20 MG capsule Take 1 capsule (20 mg total) by mouth daily. 09/20/18  Yes Lord, Asa Saunas, NP  insulin aspart (NOVOLOG FLEXPEN) 100 UNIT/ML FlexPen 1 unit/g of carbohydrate sliding scale. Patient taking differently: Inject 1-15 Units into the skin 3 (three) times daily with meals. 1 unit/g of carbohydrate sliding scale. 11/28/18  Yes Delo, Nathaneil Canary, MD  Insulin Glargine (LANTUS) 100 UNIT/ML Solostar Pen Inject 30 Units into the skin 2 (two) times daily. 11/28/18  Yes Delo, Nathaneil Canary, MD  traZODone (DESYREL) 100 MG tablet Take 1 tablet (100 mg total) by mouth at bedtime as needed for sleep. 09/19/18  Yes Lord, Asa Saunas, NP  albuterol (PROVENTIL HFA;VENTOLIN HFA) 108 (90 Base) MCG/ACT inhaler Inhale 1 puff into the lungs every 4 (four) hours as needed for wheezing or shortness of breath. Patient not taking: Reported on 10/26/2018 09/16/18   Jonetta Osgood, MD  blood glucose meter kit and supplies KIT Dispense based on patient and insurance preference. Use up to four times daily as directed. (FOR ICD-9 250.00,  250.01). 11/28/18   [name] Speak, MD  blood glucose meter kit and supplies Dispense based on patient and insurance preference. Use up to four times daily as directed. (FOR ICD-10 E10.9, E11.9). 09/16/18   Ghimire, Henreitta Leber, MD  glucose blood (FREESTYLE TEST STRIPS) test strip Use as instructed 09/16/18   Jonetta Osgood, MD  Insulin Pen Needle 32G X 8 MM MISC Use as directed 10/10/18   Kinnie Feil, PA-C    Family History Family History  Problem Relation Age of Onset  . Asthma Mother   . COPD Mother   . Other Mother        possible autoimmune, unclear  . Diabetes Maternal Grandmother   . Heart disease Maternal Grandmother   . Hypertension Maternal Grandmother   . Mental illness Maternal Grandmother   . Heart disease Maternal Grandfather   . Hyperlipidemia Paternal Grandmother   . Hyperlipidemia Paternal Grandfather   . Migraines Sister        Hemiplegic Migraines     Social History Social History   Tobacco Use  . Smoking status: Passive Smoke Exposure - Never Smoker  .  Smokeless tobacco: Never Used  . Tobacco comment: Mom and dad smoke outside   Substance Use Topics  . Alcohol use: No    Alcohol/week: 0.0 standard drinks  . Drug use: Yes    Types: Methamphetamines     Allergies   Bee venom, Fish allergy, Shellfish allergy, and Tea   Review of Systems Review of Systems  Constitutional: Negative for chills, diaphoresis and fever.  HENT: Negative.   Respiratory: Negative.  Negative for shortness of breath.   Cardiovascular: Positive for chest pain.  Gastrointestinal: Negative.  Negative for nausea and vomiting.  Musculoskeletal: Negative.   Skin: Negative.   Neurological: Negative.      Physical Exam Updated Vital Signs BP (!) 151/100   Pulse (!) 112   Temp 98.1 F (36.7 C) (Oral)   Resp 18   Ht '5\' 10"'  (1.778 m)   Wt 90.7 kg   SpO2 97%   BMI 28.70 kg/m   Physical Exam Constitutional:      Appearance: He is well-developed.  HENT:     Head:  Normocephalic.  Neck:     Musculoskeletal: Normal range of motion and neck supple.  Cardiovascular:     Rate and Rhythm: Regular rhythm. Tachycardia present.     Heart sounds: No murmur.  Pulmonary:     Effort: Pulmonary effort is normal. No tachypnea.     Breath sounds: Normal breath sounds. No wheezing, rhonchi or rales.  Abdominal:     General: Bowel sounds are normal.     Palpations: Abdomen is soft.     Tenderness: There is no abdominal tenderness. There is no guarding or rebound.  Musculoskeletal: Normal range of motion.     Right lower leg: No edema.     Left lower leg: No edema.  Skin:    General: Skin is warm and dry.     Findings: No rash.  Neurological:     Mental Status: He is alert and oriented to person, place, and time.      ED Treatments / Results  Labs (all labs ordered are listed, but only abnormal results are displayed) Labs Reviewed  BASIC METABOLIC PANEL - Abnormal; Notable for the following components:      Result Value   Sodium 125 (*)    Chloride 89 (*)    CO2 17 (*)    Glucose, Bld 671 (*)    Anion gap 19 (*)    All other components within normal limits  CBC - Abnormal; Notable for the following components:   WBC 11.6 (*)    RBC 5.97 (*)    All other components within normal limits  CBG MONITORING, ED - Abnormal; Notable for the following components:   Glucose-Capillary 500 (*)    All other components within normal limits  POCT I-STAT EG7 - Abnormal; Notable for the following components:   pCO2, Ven 32.8 (*)    pO2, Ven 59.0 (*)    Sodium 126 (*)    Potassium 5.6 (*)    All other components within normal limits  CBG MONITORING, ED - Abnormal; Notable for the following components:   Glucose-Capillary 387 (*)    All other components within normal limits  SARS CORONAVIRUS 2 (HOSPITAL ORDER, [facility])  I-STAT VENOUS BLOOD GAS, ED  TROPONIN I (HIGH SENSITIVITY)    EKG EKG Interpretation  Date/Time:  Monday  February 07 2019 02:16:07 EDT Ventricular Rate:  117 PR Interval:  146 QRS Duration: 74 QT Interval:  304 QTC Calculation: 424 R Axis:   72 Text Interpretation:  Sinus tachycardia Septal infarct , age undetermined Abnormal ECG resolved TW changes since prior Otherwise no significant change Confirmed by Addison Lank [phone]) on 02/07/2019 5:12:50 AM   Radiology Dg Chest 2 View  Result Date: 02/07/2019 CLINICAL DATA:  Mid chest pain EXAM: CHEST - 2 VIEW COMPARISON:  10/10/2018 FINDINGS: Lungs are clear.  No pleural effusion or pneumothorax. The heart is normal in size. Visualized osseous structures are within normal limits. IMPRESSION: Normal chest radiographs. Electronically Signed   By: Julian Hy M.D.   On: 02/07/2019 02:32    Procedures Procedures (including critical care time) CRITICAL CARE Performed by: Dewaine Oats   Total critical care time: 45 minutes  Critical care time was exclusive of separately billable procedures and treating other patients.  Critical care was necessary to treat or prevent imminent or life-threatening deterioration.  Critical care was time spent personally by me on the following activities: development of treatment plan with patient and/or surrogate as well as nursing, discussions with consultants, evaluation of patient's response to treatment, examination of patient, obtaining history from patient or surrogate, ordering and performing treatments and interventions, ordering and review of laboratory studies, ordering and review of radiographic studies, pulse oximetry and re-evaluation of patient's condition.  Medications Ordered in ED Medications  dextrose 5 %-0.45 % sodium chloride infusion (has no administration in time range)  insulin regular, human (MYXREDLIN) 100 units/ 100 mL infusion (3.3 Units/hr Intravenous Rate/Dose Change 02/07/19 0614)  sodium chloride flush (NS) 0.9 % injection 3 mL (3 mLs Intravenous Given 02/07/19 0414)  sodium  chloride 0.9 % bolus 2,000 mL (0 mLs Intravenous Stopped 02/07/19 0616)     Initial Impression / Assessment and Plan / ED Course  I have reviewed the triage vital signs and the nursing notes.  Pertinent labs & imaging results that were available during my care of the patient were reviewed by me and considered in my medical decision making (see chart for details).        Patient to ED with chest pain associated with doing meth, x 4 days. No fever.   Tachycardic on arrival, NAD. No vomiting. He is found to have hyperglycemia to 671, with evidence of DKA. IVF's started. He is placed on the glucostabilizer. CXR without evidence of infiltrates. EKG nonacute for ischemia.   On recheck, he is resting, no complaints. CBG is 500, fluids bolusing.   Discussed admission with dr. Hal Hope who accepts the patient the [facility] service.   6:30 - the patient now states he was to sign out AMA. He refuses further treatment despite his condition being explained as emergent with death a possible consequence of declining treatment. The patient states he understands and still wants to sign out AMA. Dr. Leonette Monarch aware.   Final Clinical Impressions(s) / ED Diagnoses   Final diagnoses:  Diabetic ketoacidosis without coma associated with type 1 diabetes mellitus [facility])   [facility] discharge ED Discharge Orders    None       Charlann Lange, PA-C 02/07/19 7001    Fatima Blank, MD 02/07/19 [phone]

## 2019-02-07 NOTE — ED Notes (Signed)
Pt denies any SI at this time.

## 2019-03-22 ENCOUNTER — Other Ambulatory Visit: Payer: Self-pay | Admitting: Emergency Medicine

## 2019-04-22 ENCOUNTER — Emergency Department (HOSPITAL_COMMUNITY)
Admission: EM | Admit: 2019-04-22 | Discharge: 2019-04-22 | Disposition: A | Discharge status: Home / Self Care | Payer: Medicaid Other | Attending: Emergency Medicine | Admitting: Emergency Medicine

## 2019-04-22 ENCOUNTER — Other Ambulatory Visit: Payer: Self-pay

## 2019-04-22 ENCOUNTER — Encounter (HOSPITAL_COMMUNITY): Payer: Self-pay | Admitting: Emergency Medicine

## 2019-04-22 DIAGNOSIS — Z79899 Other long term (current) drug therapy: Secondary | ICD-10-CM | POA: Insufficient documentation

## 2019-04-22 DIAGNOSIS — J45909 Unspecified asthma, uncomplicated: Secondary | ICD-10-CM | POA: Diagnosis not present

## 2019-04-22 DIAGNOSIS — Z7722 Contact with and (suspected) exposure to environmental tobacco smoke (acute) (chronic): Secondary | ICD-10-CM | POA: Diagnosis not present

## 2019-04-22 DIAGNOSIS — E1065 Type 1 diabetes mellitus with hyperglycemia: Secondary | ICD-10-CM | POA: Diagnosis present

## 2019-04-22 DIAGNOSIS — Z794 Long term (current) use of insulin: Secondary | ICD-10-CM | POA: Insufficient documentation

## 2019-04-22 LAB — BASIC METABOLIC PANEL
Anion gap: 16 — ABNORMAL HIGH (ref 5–15)
Anion gap: 15 (ref 5–15)
BUN: 14 mg/dL (ref 6–20)
BUN: 14 mg/dL (ref 6–20)
CO2: 21 mmol/L — ABNORMAL LOW (ref 22–32)
CO2: 20 mmol/L — ABNORMAL LOW (ref 22–32)
Calcium: 9.4 mg/dL (ref 8.9–10.3)
Calcium: 9.3 mg/dL (ref 8.9–10.3)
Chloride: 91 mmol/L — ABNORMAL LOW (ref 98–111)
Chloride: 93 mmol/L — ABNORMAL LOW (ref 98–111)
Creatinine, Ser: 0.92 mg/dL (ref 0.61–1.24)
Creatinine, Ser: 0.87 mg/dL (ref 0.61–1.24)
GFR calc Af Amer: >60 mL/min (ref >60)
GFR calc Af Amer: >60 mL/min (ref >60)
GFR calc non Af Amer: >60 mL/min (ref >60)
GFR calc non Af Amer: >60 mL/min (ref >60)
Glucose, Bld: 589 mg/dL (ref 70–99)
Glucose, Bld: 643 mg/dL (ref 70–99)
Potassium: 4.3 mmol/L (ref 3.5–5.1)
Potassium: 4.5 mmol/L (ref 3.5–5.1)
Sodium: 128 mmol/L — ABNORMAL LOW (ref 135–145)
Sodium: 128 mmol/L — ABNORMAL LOW (ref 135–145)

## 2019-04-22 LAB — URINALYSIS, ROUTINE W REFLEX MICROSCOPIC
Bacteria, UA: NONE SEEN
Bilirubin Urine: NEGATIVE
Glucose, UA: >=500 mg/dL — AB
Hgb urine dipstick: NEGATIVE
Ketones, ur: NEGATIVE mg/dL
Leukocytes,Ua: NEGATIVE
Nitrite: NEGATIVE
Protein, ur: NEGATIVE mg/dL
Specific Gravity, Urine: 1.024 (ref 1.005–1.030)
pH: 5 (ref 5.0–8.0)

## 2019-04-22 LAB — CBG MONITORING, ED
Glucose-Capillary: 582 mg/dL (ref 70–99)
Glucose-Capillary: 415 mg/dL — ABNORMAL HIGH (ref 70–99)

## 2019-04-22 LAB — CBC
HCT: 48 % (ref 39.0–52.0)
Hemoglobin: 16.4 g/dL (ref 13.0–17.0)
MCH: 27.2 pg (ref 26.0–34.0)
MCHC: 34.2 g/dL (ref 30.0–36.0)
MCV: 79.7 fL — ABNORMAL LOW (ref 80.0–100.0)
Platelets: 311 10*3/uL (ref 150–400)
RBC: 6.02 MIL/uL — ABNORMAL HIGH (ref 4.22–5.81)
RDW: 11.8 % (ref 11.5–15.5)
WBC: 11.1 10*3/uL — ABNORMAL HIGH (ref 4.0–10.5)
nRBC: 0 % (ref 0.0–0.2)

## 2019-04-22 MED ORDER — INSULIN ASPART 100 UNIT/ML ~~LOC~~ SOLN
10.0000 [IU] | Freq: Once | SUBCUTANEOUS | Status: AC
  Administered 2019-04-22: 21:00:00 10 [IU] via INTRAVENOUS
  Filled 2019-04-22: qty 0.1

## 2019-04-22 MED ORDER — SODIUM CHLORIDE 0.9 % IV BOLUS
1000.0000 mL | Freq: Once | INTRAVENOUS | Status: AC
  Administered 2019-04-22: 1000 mL via INTRAVENOUS

## 2019-04-22 MED ORDER — BLOOD GLUCOSE MONITOR KIT: PACK | 0 refills | Status: DC

## 2019-04-22 MED ORDER — INSULIN ASPART 100 UNIT/ML ~~LOC~~ SOLN
10.0000 [IU] | Freq: Once | SUBCUTANEOUS | Status: AC
  Administered 2019-04-22: 10 [IU] via INTRAVENOUS
  Filled 2019-04-22: qty 0.1

## 2019-04-22 MED ORDER — SODIUM CHLORIDE 0.9 % IV BOLUS
1000.0000 mL | Freq: Once | INTRAVENOUS | Status: AC
  Administered 2019-04-22: 19:00:00 1000 mL via INTRAVENOUS

## 2019-04-22 NOTE — ED Triage Notes (Signed)
Pt reports that he hasnt been managing his diabetes like he should (not watching what eats, has no glucometer and hasn't checked blood sugar levels at home in year). Does state that he took insulin as prescribed this morning. Pt adds having back pains. reports increase in urination.

## 2019-04-22 NOTE — Progress Notes (Addendum)
TOC Chart review: 6 ED visits in 6 months/no PCP  TOC CM spoke to pt and states his PCP is [facility] on [address]. States he has not had a follow up visit in awhile. States he is able to get medications but states he is not compliant with his diabetic diet. Does not have glucometer to check his blood sugars. Provided pt with brochure for [facility], he will need to follow up with his clinic and contact his Medicaid CW to have changed to another provider. [facility] does accept his Medicaid. Will continue to follow for dc needs.   PCP listed on his Medicaid card is [facility].   Patient requested [name] CM contact Probation Officer, Edwena Bunde # [phone] office number [phone]. Contacted PO and left message on both voicemail. Pt is currently on house arrest and has to report home by 7 pm.     Jonnie Finner RN [place], [place] ED TOC CM [phone]

## 2019-04-22 NOTE — ED Notes (Signed)
An After Visit Summary was printed and given to the patient. Discharge instructions given and no further questions at this time.  

## 2019-04-22 NOTE — ED Provider Notes (Signed)
[facility] DEPT Provider Note   CSN: [id number] Arrival date & time: 04/22/19  1558     History   Chief Complaint Chief Complaint  Patient presents with  . Hyperglycemia    HPI [name] [name]. is a 18 y.o. male with past medical history of uncontrolled type 1 diabetes, asthma, sickle cell trait, polysubstance abuse, presenting to the emergency department with hyperglycemia.  Patient states he does not manage his diabetes well including not eating the appropriate diet or taking his insulin as prescribed.  He states he takes 30 units of Lantus in the morning and at bedtime and instead of taking his NovoLog on a sliding scale basis, he takes 30 units in the morning and at bedtime of NovoLog as well.  He states his brother who also has diabetes just passed yesterday due to his diabetes and he feels as though it is time he starts to manage it appropriately.  He is also requesting diabetic education resources.  He states today he does not feel as though he is in DKA because he does not have the common symptoms of it nor does his urine will smell as though he is in DKA.  He does report right-sided flank pain which comes and goes chronically, it is worse with movement.     The history is provided by the patient.    Past Medical History:  Diagnosis Date  . ADD (attention deficit disorder)   . Allergy   . Anxiety   . Asthma   . Depression   . Epileptic seizure ([place])    "both petit and grand mal; last sz ~ 2 wk ago" (06/17/2013)  . Headache(784.0)    "2-3 times/wk usually" (06/17/2013)  . Heart murmur    "heard a slight one earlier today" (06/17/2013)  . Homeless   . Migraine    "maybe once/month; it's severe" (06/17/2013)  . ODD (oppositional defiant disorder)   . Sickle cell trait ([place])   . Type I diabetes mellitus ([place])    "that's what they're thinking now" (06/17/2013)  . Vision abnormalities    "takes him longer to focus cause; from his sz"  (06/17/2013)    Patient Active Problem List   Diagnosis Date Noted  . 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 AKI (acute kidney injury) ([place]) 09/15/2018  . URI (upper respiratory infection) 09/15/2018  . Adjustment disorder with mixed disturbance of emotions and conduct   . Atypical chest pain 08/19/2018  . Borderline personality disorder ([place]) 02/26/2018  . MDD (major depressive disorder), recurrent, severe, with psychosis ([place]) 02/25/2018  . Major depressive disorder, recurrent severe without psychotic features ([place]) 07/24/2017  . DKA, type 1 ([place]) 07/16/2017  . Nausea & vomiting 07/16/2017  . Uncontrolled type 1 diabetes circulatory disorder erectile dysfunction ([place])   . DKA (diabetic ketoacidoses) ([place]) 12/18/2016  . History of seizures 12/01/2016  . History of migraine 12/01/2016  . Disordered eating 03/08/2015  . Ketonuria   . Adjustment reaction to medical therapy   . Non compliance w medication regimen   . Diabetic peripheral neuropathy associated with type 1 diabetes mellitus ([place]) 09/29/2014  . Dehydration 08/22/2014  . Hyperglycemia due to type 1 diabetes mellitus ([place]) 08/21/2014  . Type 1 diabetes mellitus with hyperglycemia ([place])   . Noncompliance   . Generalized abdominal pain   . Glycosuria   . Depression 07/12/2014  . Involuntary movements 06/13/2014  . Bilateral leg pain 06/13/2014  . 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 Suicidal ideations 03/31/2014  . ODD (oppositional defiant disorder) 03/03/2014  . MDD (major depressive disorder), recurrent episode, moderate ([place]) 02/16/2014  . Attention deficit hyperactivity disorder (ADHD), combined type, moderate 02/16/2014  . Microcytic anemia 02/15/2014  . Hyperglycemia 02/14/2014  . Goiter 02/04/2014  . 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[place]) 02/04/2014  . Acquired acanthosis nigricans 02/04/2014  . Obesity, morbid ([place])  02/04/2014  . Insulin resistance 02/04/2014  . Hyperinsulinemia 02/04/2014  . Hypoglycemia associated with diabetes ([place]) 02/02/2014  . Maladaptive health behaviors affecting medical condition 02/02/2014  . Hypoglycemia 02/02/2014  . Type 1 diabetes mellitus in patient 96 to 20 years of age with hemoglobin A1c goal of less than 7.5% ([place]) 01/26/2014  . Partial epilepsy with impairment of consciousness ([place]) 12/13/2013  . 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 Migraine without aura 12/13/2013  . Body mass index, pediatric, greater than or equal to 95th percentile for age 78/16/2015  . Asthma 12/13/2013  . Hypoglycemia unawareness in type 1 diabetes mellitus ([place]) 08/08/2013  . Seizure disorder ([place]) 07/06/2013  . Short-term memory loss 07/06/2013  . Sickle cell trait ([place]) 07/06/2013  . Diabetes ([place]) 06/17/2013  . Diabetes mellitus, new onset ([name]) 06/17/2013    Past Surgical History:  Procedure Laterality Date  . CIRCUMCISION  2000  . FINGER SURGERY Left 2001   "crushed pinky; had to repair it" (06/17/2013)        Home Medications    Prior to Admission medications   Medication Sig Start Date End Date Taking? Authorizing Provider  albuterol (PROVENTIL HFA;VENTOLIN HFA) 108 (90 Base) MCG/ACT inhaler Inhale 1 puff into the lungs every 4 (four) hours as needed for wheezing or shortness of breath. 09/16/18  Yes Ghimire, Henreitta Leber, MD  ARIPiprazole (ABILIFY) 5 MG tablet Take 5 mg by mouth at bedtime.  02/05/19  Yes [provider]  buPROPion (WELLBUTRIN SR) 150 MG 12 hr tablet Take 150 mg by mouth daily. 02/05/19  Yes [provider]  FLUoxetine (PROZAC) 20 MG capsule Take 1 capsule (20 mg total) by mouth daily. 09/20/18  Yes Lord, Asa Saunas, NP  insulin aspart (NOVOLOG FLEXPEN) 100 UNIT/ML FlexPen 1 unit/g of carbohydrate sliding scale. Patient taking differently: Inject 1-15 Units into the skin 3 (three) times daily with meals. 1 unit/g of carbohydrate  sliding scale. 11/28/18  Yes Delo, Nathaneil Canary, MD  Insulin Glargine (LANTUS) 100 UNIT/ML Solostar Pen Inject 30 Units into the skin 2 (two) times daily. 11/28/18  Yes Delo, Nathaneil Canary, MD  LORazepam (ATIVAN) 2 MG tablet Take 2 mg by mouth every 6 (six) hours as needed for anxiety.   Yes [provider]  traZODone (DESYREL) 100 MG tablet Take 1 tablet (100 mg total) by mouth at bedtime as needed for sleep. Patient taking differently: Take 100 mg by mouth at bedtime.  09/19/18  Yes Patrecia Pour, NP  blood glucose meter kit and supplies KIT Dispense based on patient and insurance preference. Use up to four times daily as directed. (FOR ICD-9 250.00, 250.01). 04/22/19   Samanvitha Germany, [place] N, PA-C  blood glucose meter kit and supplies Dispense based on patient and insurance preference. Use up to four times daily as directed. (FOR ICD-10 E10.9, E11.9). 09/16/18   Ghimire, Henreitta Leber, MD  glucose blood (FREESTYLE TEST STRIPS) test strip Use as instructed 09/16/18   Jonetta Osgood, MD  Insulin Pen Needle 32G X 8 MM MISC Use as directed 10/10/18   Carmon Sails  J, PA-C    Family History Family History  Problem Relation Age of Onset  . Asthma Mother   . COPD Mother   . Other Mother        possible autoimmune, unclear  . Diabetes Maternal Grandmother   . Heart disease Maternal Grandmother   . Hypertension Maternal Grandmother   . Mental illness Maternal Grandmother   . Heart disease Maternal Grandfather   . Hyperlipidemia Paternal Grandmother   . Hyperlipidemia Paternal Grandfather   . Migraines Sister        Hemiplegic Migraines     Social History Social History   Tobacco Use  . Smoking status: Passive Smoke Exposure - Never Smoker  . Smokeless tobacco: Never Used  . Tobacco comment: Mom and dad smoke outside   Substance Use Topics  . Alcohol use: No    Alcohol/week: 0.0 standard drinks  . Drug use: Yes    Types: Methamphetamines     Allergies   Bee venom, Fish allergy,  Shellfish allergy, and Tea   Review of Systems Review of Systems  Constitutional: Negative for fever.  Endocrine: Positive for polyuria.  Genitourinary: Positive for flank pain. Negative for dysuria.  All other systems reviewed and are negative.    Physical Exam Updated Vital Signs BP (!) 108/57   Pulse 92   Temp 98.2 F (36.8 C) (Oral)   Resp 18   SpO2 96%   Physical Exam Vitals signs and nursing note reviewed.  Constitutional:      General: He is not in acute distress.    Appearance: He is well-developed. He is not ill-appearing.  HENT:     Head: Normocephalic and atraumatic.  Eyes:     Conjunctiva/sclera: Conjunctivae normal.  Cardiovascular:     Rate and Rhythm: Regular rhythm. Tachycardia present.  Pulmonary:     Effort: Pulmonary effort is normal. No respiratory distress.     Breath sounds: Normal breath sounds.  Abdominal:     General: Bowel sounds are normal.     Palpations: Abdomen is soft.     Tenderness: There is no abdominal tenderness. There is no guarding or rebound.  Skin:    General: Skin is warm.  Neurological:     Mental Status: He is alert.  Psychiatric:        Behavior: Behavior normal.      ED Treatments / Results  Labs (all labs ordered are listed, but only abnormal results are displayed) Labs Reviewed  BASIC METABOLIC PANEL - Abnormal; Notable for the following components:      Result Value   Sodium 128 (*)    Chloride 91 (*)    CO2 21 (*)    Glucose, Bld 589 (*)    Anion gap 16 (*)    All other components within normal limits  CBC - Abnormal; Notable for the following components:   WBC 11.1 (*)    RBC 6.02 (*)    MCV 79.7 (*)    All other components within normal limits  URINALYSIS, ROUTINE W REFLEX MICROSCOPIC - Abnormal; Notable for the following components:   Color, Urine STRAW (*)    Glucose, UA >=500 (*)    All other components within normal limits  BASIC METABOLIC PANEL - Abnormal; Notable for the following  components:   Sodium 128 (*)    Chloride 93 (*)    CO2 20 (*)    Glucose, Bld 643 (*)    All other components within normal limits  CBG MONITORING,  ED - Abnormal; Notable for the following components:   Glucose-Capillary 582 (*)    All other components within normal limits  CBG MONITORING, ED - Abnormal; Notable for the following components:   Glucose-Capillary 415 (*)    All other components within normal limits    EKG None  Radiology No results found.  Procedures Procedures (including critical care time)  Medications Ordered in ED Medications  sodium chloride 0.9 % bolus 1,000 mL (0 mLs Intravenous Stopped 04/22/19 1913)  insulin aspart (novoLOG) injection 10 Units (10 Units Intravenous Given 04/22/19 1755)  sodium chloride 0.9 % bolus 1,000 mL (0 mLs Intravenous Stopped 04/22/19 1955)  insulin aspart (novoLOG) injection 10 Units (10 Units Intravenous Given 04/22/19 2125)     Initial Impression / Assessment and Plan / ED Course  I have reviewed the triage vital signs and the nursing notes.  Pertinent labs & imaging results that were available during my care of the patient were reviewed by me and considered in my medical decision making (see chart for details).        Patient with history of uncontrolled type 1 diabetes, presenting the emergency department with hyperglycemia.  Patient states he feels he is ready to begin managing his diabetes properly as he had a recent death of his brother who died from diabetes.  He is prescribed Lantus and sliding scale NovoLog, however doses his NovoLog as he does his Lantus. He states today he does not feel as though he is in DKA, though wanted to be seen today to be sure to control his blood sugar.  No associated nausea, vomiting or belly pain.  Labs today reveal initial CBG of 582.  Metabolic panel reveals slight gap of 16 with a bicarb of 21.  Normal potassium.  Pseudohyponatremia is present, corrected sodium 140.  Patient  discussed with Dr. Langston Masker.  Will administer 2L of IV saline, as well as 10 units of insulin and reevaluate.   Recheck metabolic panel reveals a closed gap and repeat CBG is 415 after interventions.  Administered a second 10 unit dose of insulin with plan to recheck, however patient states he is ready to go home and feels well.  I discussed importance of diet modification, continuous monitoring of blood sugar and properly dosing his sliding scale insulin.  Strongly encouraged PCP follow-up and discussed that they can provide him with diabetes education.  He was provided prescription for glucometer as he states he does not currently have one.  He is instructed return to the emergency department for any new or worsening symptoms.  Discussed results, findings, treatment and follow up. Patient advised of return precautions. Patient verbalized understanding and agreed with plan.  Final Clinical Impressions(s) / ED Diagnoses   Final diagnoses:  Hyperglycemia due to type 1 diabetes mellitus [facility])    ED Discharge Orders         Ordered    blood glucose meter kit and supplies KIT     04/22/19 2137           Nima Kemppainen, [place] N, PA-C 04/22/19 2321    Wyvonnia Dusky, MD 04/23/19 1155

## 2019-04-22 NOTE — ED Notes (Signed)
[name] [name] aware BNP shows blood glucose to be 643 at 2015. CBG done at 2043 to be 415.

## 2019-04-22 NOTE — Discharge Instructions (Signed)
It is important you stay hydrated with water. Monitor your blood sugars and use your insulin as prescribed. Schedule an appointment with your primary care provider soon as possible for follow-up. Return to the emergency department for new or worsening symptoms.

## 2019-05-10 ENCOUNTER — Encounter (HOSPITAL_COMMUNITY): Payer: Self-pay

## 2019-05-10 ENCOUNTER — Emergency Department (HOSPITAL_COMMUNITY)
Admission: EM | Admit: 2019-05-10 | Discharge: 2019-05-10 | Disposition: A | Discharge status: Home / Self Care | Payer: Medicaid Other | Attending: Emergency Medicine | Admitting: Emergency Medicine

## 2019-05-10 DIAGNOSIS — Z5321 Procedure and treatment not carried out due to patient leaving prior to being seen by health care provider: Secondary | ICD-10-CM

## 2019-05-10 DIAGNOSIS — R4182 Altered mental status, unspecified: Secondary | ICD-10-CM | POA: Insufficient documentation

## 2019-05-10 NOTE — ED Triage Notes (Signed)
Pt comes via [facility], [place], pt is diabetic, CBG in the 500's, c/o of CP, pt possibly called a suicide hotline today per family, pt denies, pt denies drug or ETOH use, pt is AXO x 4, pt denies SI/HI/AVH

## 2019-05-10 NOTE — ED Provider Notes (Signed)
I went to evaluate the patient at approximately 22:20.  The patient's room was empty when I entered.  Nursing staff reports that the patient left the Emergency Department and did not want to be evaluated. I did not examine or see this patient prior to him leaving the department.    Joanne Gavel, PA-C 05/10/19 [place], [place], DO 05/10/19 2342

## 2019-05-10 NOTE — ED Notes (Signed)
Pt refuses to stay to be seen, left AMA, MD aware

## 2019-05-23 ENCOUNTER — Other Ambulatory Visit: Payer: Self-pay

## 2019-05-23 ENCOUNTER — Emergency Department (HOSPITAL_COMMUNITY)
Admission: EM | Admit: 2019-05-23 | Discharge: 2019-05-23 | Disposition: A | Discharge status: Home / Self Care | Payer: Medicaid Other | Attending: Emergency Medicine | Admitting: Emergency Medicine

## 2019-05-23 ENCOUNTER — Encounter (HOSPITAL_COMMUNITY): Payer: Self-pay | Admitting: Emergency Medicine

## 2019-05-23 DIAGNOSIS — J45909 Unspecified asthma, uncomplicated: Secondary | ICD-10-CM | POA: Diagnosis not present

## 2019-05-23 DIAGNOSIS — E1065 Type 1 diabetes mellitus with hyperglycemia: Secondary | ICD-10-CM | POA: Diagnosis not present

## 2019-05-23 DIAGNOSIS — R739 Hyperglycemia, unspecified: Secondary | ICD-10-CM

## 2019-05-23 DIAGNOSIS — Z7722 Contact with and (suspected) exposure to environmental tobacco smoke (acute) (chronic): Secondary | ICD-10-CM | POA: Insufficient documentation

## 2019-05-23 LAB — CBC
HCT: 43 % (ref 39.0–52.0)
Hemoglobin: 14.6 g/dL (ref 13.0–17.0)
MCH: 27.1 pg (ref 26.0–34.0)
MCHC: 34 g/dL (ref 30.0–36.0)
MCV: 79.8 fL — ABNORMAL LOW (ref 80.0–100.0)
Platelets: 242 10*3/uL (ref 150–400)
RBC: 5.39 MIL/uL (ref 4.22–5.81)
RDW: 12 % (ref 11.5–15.5)
WBC: 5.1 10*3/uL (ref 4.0–10.5)
nRBC: 0 % (ref 0.0–0.2)

## 2019-05-23 LAB — COMPREHENSIVE METABOLIC PANEL
ALT: 34 U/L (ref 0–44)
AST: 28 U/L (ref 15–41)
Albumin: 3.4 g/dL — ABNORMAL LOW (ref 3.5–5.0)
Alkaline Phosphatase: 110 U/L (ref 38–126)
Anion gap: 13 (ref 5–15)
BUN: 12 mg/dL (ref 6–20)
CO2: 20 mmol/L — ABNORMAL LOW (ref 22–32)
Calcium: 8.9 mg/dL (ref 8.9–10.3)
Chloride: 103 mmol/L (ref 98–111)
Creatinine, Ser: 0.75 mg/dL (ref 0.61–1.24)
GFR calc Af Amer: >60 mL/min (ref >60)
GFR calc non Af Amer: >60 mL/min (ref >60)
Glucose, Bld: 351 mg/dL — ABNORMAL HIGH (ref 70–99)
Potassium: 4.1 mmol/L (ref 3.5–5.1)
Sodium: 136 mmol/L (ref 135–145)
Total Bilirubin: 0.9 mg/dL (ref 0.3–1.2)
Total Protein: 6.4 g/dL — ABNORMAL LOW (ref 6.5–8.1)

## 2019-05-23 LAB — URINALYSIS, ROUTINE W REFLEX MICROSCOPIC
Bacteria, UA: NONE SEEN
Bilirubin Urine: NEGATIVE
Glucose, UA: >=500 mg/dL — AB
Hgb urine dipstick: NEGATIVE
Ketones, ur: 5 mg/dL — AB
Leukocytes,Ua: NEGATIVE
Nitrite: NEGATIVE
Protein, ur: NEGATIVE mg/dL
Specific Gravity, Urine: 1.024 (ref 1.005–1.030)
pH: 6 (ref 5.0–8.0)

## 2019-05-23 LAB — BLOOD GAS, VENOUS
Acid-base deficit: 2.9 mmol/L — ABNORMAL HIGH (ref 0.0–2.0)
Bicarbonate: 20.7 mmol/L (ref 20.0–28.0)
O2 Saturation: 95.2 %
Patient temperature: 98.6
pCO2, Ven: 34.5 mmHg — ABNORMAL LOW (ref 44.0–60.0)
pH, Ven: 7.396 (ref 7.250–7.430)
pO2, Ven: 76.7 mmHg — ABNORMAL HIGH (ref 32.0–45.0)

## 2019-05-23 LAB — CBG MONITORING, ED: Glucose-Capillary: 348 mg/dL — ABNORMAL HIGH (ref 70–99)

## 2019-05-23 MED ORDER — SODIUM CHLORIDE 0.9 % IV SOLN
10.00 | INTRAVENOUS | Status: DC
Start: ? — End: 2019-05-23

## 2019-05-23 MED ORDER — GENERIC EXTERNAL MEDICATION
Status: DC
Start: ? — End: 2019-05-23

## 2019-05-23 NOTE — Discharge Instructions (Addendum)
You were evaluated in the Emergency Department and after careful evaluation, we did not find any emergent condition requiring admission or further testing in the hospital.  Your exam/testing today was overall reassuring.  Please keep a close eye on your blood sugars throughout the day today and follow-up with your regular doctor.  Please return to the Emergency Department if you experience any worsening of your condition.  We encourage you to follow up with a primary care provider.  Thank you for allowing [place] to be a part of your care.

## 2019-05-23 NOTE — ED Provider Notes (Signed)
[facility] Emergency Department Provider Note MRN:  [id number]  Arrival date & time: 05/23/19     Chief Complaint   Hyperglycemia   History of Present Illness   [name] [name]. is a 18 y.o. year-old male with a history of type 1 diabetes, seizure disorder presenting to the ED with chief complaint of hyperglycemia.  Patient explains that his blood sugar was read as high this morning.  He feels like he might be in DKA.  He is having some right flank/right lower quadrant pain, described as a soreness, which occurs when he has high blood sugars.  He denies fever, no cough, no dysuria, no rash, no chest pain or shortness of breath.  Review of Systems  A complete 10 system review of systems was obtained and all systems are negative except as noted in the HPI and PMH.   Patient's Health History    Past Medical History:  Diagnosis Date  . ADD (attention deficit disorder)   . Allergy   . Anxiety   . Asthma   . Depression   . Epileptic seizure ([place])    "both petit and grand mal; last sz ~ 2 wk ago" (06/17/2013)  . Headache(784.0)    "2-3 times/wk usually" (06/17/2013)  . Heart murmur    "heard a slight one earlier today" (06/17/2013)  . Homeless   . Migraine    "maybe once/month; it's severe" (06/17/2013)  . ODD (oppositional defiant disorder)   . Sickle cell trait ([place])   . Type I diabetes mellitus ([place])    "that's what they're thinking now" (06/17/2013)  . Vision abnormalities    "takes him longer to focus cause; from his sz" (06/17/2013)    Past Surgical History:  Procedure Laterality Date  . CIRCUMCISION  2000  . FINGER SURGERY Left 2001   "crushed pinky; had to repair it" (06/17/2013)    Family History  Problem Relation Age of Onset  . Asthma Mother   . COPD Mother   . Other Mother        possible autoimmune, unclear  . Diabetes Maternal Grandmother   . Heart disease Maternal Grandmother   . Hypertension Maternal Grandmother   .  Mental illness Maternal Grandmother   . Heart disease Maternal Grandfather   . Hyperlipidemia Paternal Grandmother   . Hyperlipidemia Paternal Grandfather   . Migraines Sister        Hemiplegic Migraines     Social History   Socioeconomic History  . Marital status: Single    Spouse name: Not on file  . Number of children: Not on file  . Years of education: Not on file  . Highest education level: Not on file  Occupational History  . Not on file  Social Needs  . Financial resource strain: Not on file  . Food insecurity    Worry: Not on file    Inability: Not on file  . Transportation needs    Medical: Not on file    Non-medical: Not on file  Tobacco Use  . Smoking status: Passive Smoke Exposure - Never Smoker  . Smokeless tobacco: Never Used  . Tobacco comment: Mom and dad smoke outside   Substance and Sexual Activity  . Alcohol use: No    Alcohol/week: 0.0 standard drinks  . Drug use: Not Currently  . Sexual activity: Never  Lifestyle  . Physical activity    Days per week: Not on file    Minutes per session: Not on file  .  Stress: Not on file  Relationships  . Social Musician on phone: Not on file    Gets together: Not on file    Attends religious service: Not on file    Active member of club or organization: Not on file    Attends meetings of clubs or organizations: Not on file    Relationship status: Not on file  . Intimate partner violence    Fear of current or ex partner: Not on file    Emotionally abused: Not on file    Physically abused: Not on file    Forced sexual activity: Not on file  Other Topics Concern  . Not on file  Social History Narrative   Lives at home with father, stepmother, 68 year old brother.     Physical Exam  Vital Signs and Nursing Notes reviewed Vitals:   05/23/19 0853 05/23/19 0951  BP: 131/79 (!) 150/87  Pulse: 100 95  Resp: 16 16  Temp:  98 F (36.7 C)  SpO2: 99% 98%    CONSTITUTIONAL: Well-appearing, NAD  NEURO:  Alert and oriented x 3, no focal deficits EYES:  eyes equal and reactive ENT/NECK:  no LAD, no JVD CARDIO: Regular rate, well-perfused, normal S1 and S2 PULM:  CTAB no wheezing or rhonchi GI/GU:  normal bowel sounds, non-distended, non-tender MSK/SPINE:  No gross deformities, no edema SKIN:  no rash, atraumatic PSYCH:  Appropriate speech and behavior  Diagnostic and Interventional Summary    EKG Interpretation  Date/Time:    Ventricular Rate:    PR Interval:    QRS Duration:   QT Interval:    QTC Calculation:   R Axis:     Text Interpretation:        Labs Reviewed  CBC - Abnormal; Notable for the following components:      Result Value   MCV 79.8 (*)    All other components within normal limits  COMPREHENSIVE METABOLIC PANEL - Abnormal; Notable for the following components:   CO2 20 (*)    Glucose, Bld 351 (*)    Total Protein 6.4 (*)    Albumin 3.4 (*)    All other components within normal limits  BLOOD GAS, VENOUS - Abnormal; Notable for the following components:   pCO2, Ven 34.5 (*)    pO2, Ven 76.7 (*)    Acid-base deficit 2.9 (*)    All other components within normal limits  URINALYSIS, ROUTINE W REFLEX MICROSCOPIC - Abnormal; Notable for the following components:   Color, Urine STRAW (*)    Glucose, UA >=500 (*)    Ketones, ur 5 (*)    All other components within normal limits  CBG MONITORING, ED - Abnormal; Notable for the following components:   Glucose-Capillary 348 (*)    All other components within normal limits    No orders to display    Medications - No data to display   Procedures  /  Critical Care Procedures  ED Course and Medical Decision Making  I have reviewed the triage vital signs and the nursing notes.  Pertinent labs & imaging results that were available during my care of the patient were reviewed by me and considered in my medical decision making (see below for details).     Screening for metabolic disarray or DKA, patient  is well-appearing with normal vital signs.  In response to high blood sugar, patient took twice the amount of his normal insulin, may need a few hours of observation.  Patient has no abdominal tenderness, this abdominal pain is happened before in the setting of hyperglycemia, little to no concern for appendicitis or acute surgical process at this time.  Labs reassuring, mild hyperglycemia but normal anion gap, no acidosis, nothing to suggest DKA.  Patient is feeling well and requesting discharge.  He explains that his grandmother is having an emergency and he needs to leaves.  There is suspicion that patient came to the emergency department for secondary gain, namely a note sent to his attorney today.  Nonetheless, he is appropriate for discharge.  Patient agrees to keep a close eye on his sugars today.  Elmer Sow. Pilar Plate, MD [facility] Emergency Medicine [facility] [email or web address]  Final Clinical Impressions(s) / ED Diagnoses     ICD-10-CM   1. Hyperglycemia  R73.9     ED Discharge Orders    None       Discharge Instructions Discussed with and Provided to Patient:     Discharge Instructions     You were evaluated in the Emergency Department and after careful evaluation, we did not find any emergent condition requiring admission or further testing in the hospital.  Your exam/testing today was overall reassuring.  Please keep a close eye on your blood sugars throughout the day today and follow-up with your regular doctor.  Please return to the Emergency Department if you experience any worsening of your condition.  We encourage you to follow up with a primary care provider.  Thank you for allowing [place] to be a part of your care.        Sabas Sous, MD 05/23/19 [phone]

## 2019-05-23 NOTE — ED Triage Notes (Signed)
Pt verbalizes was seen 2 days ago and on insulin drip but left AMA. Pt verbalizes blood sugar reading high so took 70 units of novalog and 70 units of lantus 30 minutes prior to arrival. Denies n/v/d but verbalizing RLQ pain.

## 2019-07-09 ENCOUNTER — Other Ambulatory Visit: Payer: Self-pay

## 2019-07-09 ENCOUNTER — Encounter (HOSPITAL_COMMUNITY): Payer: Self-pay

## 2019-07-09 DIAGNOSIS — Z79899 Other long term (current) drug therapy: Secondary | ICD-10-CM

## 2019-07-09 DIAGNOSIS — Z8349 Family history of other endocrine, nutritional and metabolic diseases: Secondary | ICD-10-CM

## 2019-07-09 DIAGNOSIS — F603 Borderline personality disorder: Secondary | ICD-10-CM | POA: Diagnosis present

## 2019-07-09 DIAGNOSIS — Z8249 Family history of ischemic heart disease and other diseases of the circulatory system: Secondary | ICD-10-CM

## 2019-07-09 DIAGNOSIS — Z91018 Allergy to other foods: Secondary | ICD-10-CM

## 2019-07-09 DIAGNOSIS — J302 Other seasonal allergic rhinitis: Secondary | ICD-10-CM | POA: Diagnosis present

## 2019-07-09 DIAGNOSIS — Z79891 Long term (current) use of opiate analgesic: Secondary | ICD-10-CM

## 2019-07-09 DIAGNOSIS — Z20822 Contact with and (suspected) exposure to covid-19: Secondary | ICD-10-CM | POA: Diagnosis present

## 2019-07-09 DIAGNOSIS — E860 Dehydration: Secondary | ICD-10-CM | POA: Diagnosis present

## 2019-07-09 DIAGNOSIS — D573 Sickle-cell trait: Secondary | ICD-10-CM | POA: Diagnosis present

## 2019-07-09 DIAGNOSIS — Z9119 Patient's noncompliance with other medical treatment and regimen: Secondary | ICD-10-CM

## 2019-07-09 DIAGNOSIS — Z825 Family history of asthma and other chronic lower respiratory diseases: Secondary | ICD-10-CM

## 2019-07-09 DIAGNOSIS — F339 Major depressive disorder, recurrent, unspecified: Secondary | ICD-10-CM | POA: Diagnosis present

## 2019-07-09 DIAGNOSIS — F988 Other specified behavioral and emotional disorders with onset usually occurring in childhood and adolescence: Secondary | ICD-10-CM | POA: Diagnosis present

## 2019-07-09 DIAGNOSIS — F419 Anxiety disorder, unspecified: Secondary | ICD-10-CM | POA: Diagnosis present

## 2019-07-09 DIAGNOSIS — E1010 Type 1 diabetes mellitus with ketoacidosis without coma: Principal | ICD-10-CM | POA: Diagnosis present

## 2019-07-09 DIAGNOSIS — F913 Oppositional defiant disorder: Secondary | ICD-10-CM | POA: Diagnosis present

## 2019-07-09 DIAGNOSIS — Z91013 Allergy to seafood: Secondary | ICD-10-CM

## 2019-07-09 DIAGNOSIS — E1042 Type 1 diabetes mellitus with diabetic polyneuropathy: Secondary | ICD-10-CM | POA: Diagnosis present

## 2019-07-09 DIAGNOSIS — E1043 Type 1 diabetes mellitus with diabetic autonomic (poly)neuropathy: Secondary | ICD-10-CM | POA: Diagnosis present

## 2019-07-09 DIAGNOSIS — G40909 Epilepsy, unspecified, not intractable, without status epilepticus: Secondary | ICD-10-CM | POA: Diagnosis present

## 2019-07-09 DIAGNOSIS — Z7722 Contact with and (suspected) exposure to environmental tobacco smoke (acute) (chronic): Secondary | ICD-10-CM | POA: Diagnosis present

## 2019-07-09 DIAGNOSIS — Z794 Long term (current) use of insulin: Secondary | ICD-10-CM

## 2019-07-09 DIAGNOSIS — Z91030 Bee allergy status: Secondary | ICD-10-CM

## 2019-07-09 DIAGNOSIS — F4320 Adjustment disorder, unspecified: Secondary | ICD-10-CM | POA: Diagnosis present

## 2019-07-09 LAB — URINALYSIS, ROUTINE W REFLEX MICROSCOPIC
Bacteria, UA: NONE SEEN
Bilirubin Urine: NEGATIVE
Glucose, UA: >=500 mg/dL — AB
Hgb urine dipstick: NEGATIVE
Ketones, ur: 20 mg/dL — AB
Leukocytes,Ua: NEGATIVE
Nitrite: NEGATIVE
Protein, ur: NEGATIVE mg/dL
Specific Gravity, Urine: 1.022 (ref 1.005–1.030)
pH: 6 (ref 5.0–8.0)

## 2019-07-09 LAB — CBC
HCT: 44.6 % (ref 39.0–52.0)
Hemoglobin: 15 g/dL (ref 13.0–17.0)
MCH: 28.2 pg (ref 26.0–34.0)
MCHC: 33.6 g/dL (ref 30.0–36.0)
MCV: 84 fL (ref 80.0–100.0)
Platelets: 355 10*3/uL (ref 150–400)
RBC: 5.31 MIL/uL (ref 4.22–5.81)
RDW: 13.2 % (ref 11.5–15.5)
WBC: 8 10*3/uL (ref 4.0–10.5)
nRBC: 0 % (ref 0.0–0.2)

## 2019-07-09 LAB — CBG MONITORING, ED: Glucose-Capillary: >600 mg/dL (ref 70–99)

## 2019-07-09 NOTE — ED Notes (Signed)
U/A and culture sent to lab for analysis. [employer]

## 2019-07-09 NOTE — ED Triage Notes (Signed)
Pt reports hyperglycemia. He is a type 1 and states that his monitor has read "HI" for 1 week. He states that he is out of novalog and his ACT team brought him lantus. Reports intermittent abd pain.

## 2019-07-10 ENCOUNTER — Other Ambulatory Visit: Payer: Self-pay

## 2019-07-10 ENCOUNTER — Inpatient Hospital Stay (HOSPITAL_COMMUNITY)
Admission: EM | Admit: 2019-07-10 | Discharge: 2019-07-10 | DRG: 638 | Discharge status: Against Medical Advice | Payer: Medicaid Other | Attending: Internal Medicine | Admitting: Internal Medicine

## 2019-07-10 DIAGNOSIS — E1010 Type 1 diabetes mellitus with ketoacidosis without coma: Secondary | ICD-10-CM | POA: Diagnosis present

## 2019-07-10 DIAGNOSIS — F913 Oppositional defiant disorder: Secondary | ICD-10-CM | POA: Diagnosis present

## 2019-07-10 DIAGNOSIS — G40909 Epilepsy, unspecified, not intractable, without status epilepticus: Secondary | ICD-10-CM

## 2019-07-10 DIAGNOSIS — E860 Dehydration: Secondary | ICD-10-CM | POA: Diagnosis present

## 2019-07-10 DIAGNOSIS — D573 Sickle-cell trait: Secondary | ICD-10-CM | POA: Diagnosis present

## 2019-07-10 DIAGNOSIS — Z8349 Family history of other endocrine, nutritional and metabolic diseases: Secondary | ICD-10-CM | POA: Diagnosis not present

## 2019-07-10 DIAGNOSIS — J302 Other seasonal allergic rhinitis: Secondary | ICD-10-CM | POA: Diagnosis present

## 2019-07-10 DIAGNOSIS — Z79891 Long term (current) use of opiate analgesic: Secondary | ICD-10-CM | POA: Diagnosis not present

## 2019-07-10 DIAGNOSIS — Z91013 Allergy to seafood: Secondary | ICD-10-CM | POA: Diagnosis not present

## 2019-07-10 DIAGNOSIS — F419 Anxiety disorder, unspecified: Secondary | ICD-10-CM | POA: Diagnosis present

## 2019-07-10 DIAGNOSIS — F4320 Adjustment disorder, unspecified: Secondary | ICD-10-CM | POA: Diagnosis present

## 2019-07-10 DIAGNOSIS — E871 Hypo-osmolality and hyponatremia: Secondary | ICD-10-CM

## 2019-07-10 DIAGNOSIS — Z20822 Contact with and (suspected) exposure to covid-19: Secondary | ICD-10-CM | POA: Diagnosis present

## 2019-07-10 DIAGNOSIS — Z7722 Contact with and (suspected) exposure to environmental tobacco smoke (acute) (chronic): Secondary | ICD-10-CM | POA: Diagnosis present

## 2019-07-10 DIAGNOSIS — Z794 Long term (current) use of insulin: Secondary | ICD-10-CM | POA: Diagnosis not present

## 2019-07-10 DIAGNOSIS — F988 Other specified behavioral and emotional disorders with onset usually occurring in childhood and adolescence: Secondary | ICD-10-CM | POA: Diagnosis present

## 2019-07-10 DIAGNOSIS — E1043 Type 1 diabetes mellitus with diabetic autonomic (poly)neuropathy: Secondary | ICD-10-CM | POA: Diagnosis present

## 2019-07-10 DIAGNOSIS — Z91030 Bee allergy status: Secondary | ICD-10-CM | POA: Diagnosis not present

## 2019-07-10 DIAGNOSIS — F603 Borderline personality disorder: Secondary | ICD-10-CM | POA: Diagnosis present

## 2019-07-10 DIAGNOSIS — Z825 Family history of asthma and other chronic lower respiratory diseases: Secondary | ICD-10-CM | POA: Diagnosis not present

## 2019-07-10 DIAGNOSIS — E1110 Type 2 diabetes mellitus with ketoacidosis without coma: Secondary | ICD-10-CM | POA: Diagnosis present

## 2019-07-10 DIAGNOSIS — E1042 Type 1 diabetes mellitus with diabetic polyneuropathy: Secondary | ICD-10-CM | POA: Diagnosis present

## 2019-07-10 DIAGNOSIS — Z79899 Other long term (current) drug therapy: Secondary | ICD-10-CM | POA: Diagnosis not present

## 2019-07-10 DIAGNOSIS — Z9119 Patient's noncompliance with other medical treatment and regimen: Secondary | ICD-10-CM | POA: Diagnosis not present

## 2019-07-10 DIAGNOSIS — F339 Major depressive disorder, recurrent, unspecified: Secondary | ICD-10-CM | POA: Diagnosis present

## 2019-07-10 DIAGNOSIS — Z8249 Family history of ischemic heart disease and other diseases of the circulatory system: Secondary | ICD-10-CM | POA: Diagnosis not present

## 2019-07-10 LAB — BASIC METABOLIC PANEL
Anion gap: 22 — ABNORMAL HIGH (ref 5–15)
Anion gap: 11 (ref 5–15)
Anion gap: 12 (ref 5–15)
BUN: 23 mg/dL — ABNORMAL HIGH (ref 6–20)
BUN: 18 mg/dL (ref 6–20)
BUN: 12 mg/dL (ref 6–20)
CO2: 19 mmol/L — ABNORMAL LOW (ref 22–32)
CO2: 21 mmol/L — ABNORMAL LOW (ref 22–32)
CO2: 22 mmol/L (ref 22–32)
Calcium: 9 mg/dL (ref 8.9–10.3)
Calcium: 8 mg/dL — ABNORMAL LOW (ref 8.9–10.3)
Calcium: 7.9 mg/dL — ABNORMAL LOW (ref 8.9–10.3)
Chloride: 85 mmol/L — ABNORMAL LOW (ref 98–111)
Chloride: 103 mmol/L (ref 98–111)
Chloride: 104 mmol/L (ref 98–111)
Creatinine, Ser: 0.9 mg/dL (ref 0.61–1.24)
Creatinine, Ser: 0.74 mg/dL (ref 0.61–1.24)
Creatinine, Ser: 0.57 mg/dL — ABNORMAL LOW (ref 0.61–1.24)
GFR calc Af Amer: >60 mL/min (ref >60)
GFR calc Af Amer: >60 mL/min (ref >60)
GFR calc Af Amer: >60 mL/min (ref >60)
GFR calc non Af Amer: >60 mL/min (ref >60)
GFR calc non Af Amer: >60 mL/min (ref >60)
GFR calc non Af Amer: >60 mL/min (ref >60)
Glucose, Bld: 814 mg/dL (ref 70–99)
Glucose, Bld: 281 mg/dL — ABNORMAL HIGH (ref 70–99)
Glucose, Bld: 200 mg/dL — ABNORMAL HIGH (ref 70–99)
Potassium: 4.2 mmol/L (ref 3.5–5.1)
Potassium: 3.2 mmol/L — ABNORMAL LOW (ref 3.5–5.1)
Potassium: 3.6 mmol/L (ref 3.5–5.1)
Sodium: 126 mmol/L — ABNORMAL LOW (ref 135–145)
Sodium: 135 mmol/L (ref 135–145)
Sodium: 138 mmol/L (ref 135–145)

## 2019-07-10 LAB — BLOOD GAS, VENOUS
Acid-base deficit: 8.8 mmol/L — ABNORMAL HIGH (ref 0.0–2.0)
Bicarbonate: 16.1 mmol/L — ABNORMAL LOW (ref 20.0–28.0)
FIO2: 21
O2 Saturation: 56.1 %
Patient temperature: 98.6
pCO2, Ven: 33.1 mmHg — ABNORMAL LOW (ref 44.0–60.0)
pH, Ven: 7.308 (ref 7.250–7.430)
pO2, Ven: 35 mmHg (ref 32.0–45.0)

## 2019-07-10 LAB — CBG MONITORING, ED
Glucose-Capillary: 420 mg/dL — ABNORMAL HIGH (ref 70–99)
Glucose-Capillary: 227 mg/dL — ABNORMAL HIGH (ref 70–99)
Glucose-Capillary: 237 mg/dL — ABNORMAL HIGH (ref 70–99)
Glucose-Capillary: 214 mg/dL — ABNORMAL HIGH (ref 70–99)
Glucose-Capillary: 193 mg/dL — ABNORMAL HIGH (ref 70–99)
Glucose-Capillary: 166 mg/dL — ABNORMAL HIGH (ref 70–99)

## 2019-07-10 LAB — RAPID URINE DRUG SCREEN, HOSP PERFORMED
Amphetamines: NOT DETECTED
Barbiturates: NOT DETECTED
Benzodiazepines: NOT DETECTED
Cocaine: NOT DETECTED
Opiates: NOT DETECTED
Tetrahydrocannabinol: NOT DETECTED

## 2019-07-10 LAB — SARS CORONAVIRUS 2 (TAT 6-24 HRS): SARS Coronavirus 2: NEGATIVE

## 2019-07-10 LAB — BETA-HYDROXYBUTYRIC ACID: Beta-Hydroxybutyric Acid: 4.35 mmol/L — ABNORMAL HIGH (ref 0.05–0.27)

## 2019-07-10 MED ORDER — ARIPIPRAZOLE 5 MG PO TABS
5.0000 mg | ORAL_TABLET | Freq: Every day | ORAL | Status: DC
  Filled 2019-07-10: qty 1

## 2019-07-10 MED ORDER — SODIUM CHLORIDE 0.9 % IV BOLUS (SEPSIS)
2000.0000 mL | Freq: Once | INTRAVENOUS | Status: AC
  Administered 2019-07-10: 2000 mL via INTRAVENOUS

## 2019-07-10 MED ORDER — DEXTROSE-NACL 5-0.45 % IV SOLN: INTRAVENOUS | Status: DC

## 2019-07-10 MED ORDER — SODIUM CHLORIDE 0.9 % IV SOLN: INTRAVENOUS | Status: DC

## 2019-07-10 MED ORDER — DEXTROSE 50 % IV SOLN: 0.0000 mL | INTRAVENOUS | Status: DC | PRN

## 2019-07-10 MED ORDER — INSULIN GLARGINE 100 UNIT/ML SOLOSTAR PEN: 30.0000 [IU] | PEN_INJECTOR | Freq: Two times a day (BID) | SUBCUTANEOUS | 11 refills | Status: DC

## 2019-07-10 MED ORDER — POTASSIUM CHLORIDE 10 MEQ/100ML IV SOLN
10.0000 meq | INTRAVENOUS | Status: AC
  Administered 2019-07-10 (×2): 10 meq via INTRAVENOUS
  Filled 2019-07-10 (×2): qty 100

## 2019-07-10 MED ORDER — POTASSIUM CHLORIDE IN NACL 20-0.9 MEQ/L-% IV SOLN
INTRAVENOUS | Status: DC
  Filled 2019-07-10 (×2): qty 1000

## 2019-07-10 MED ORDER — HYDROXYZINE HCL 25 MG PO TABS: 25.0000 mg | ORAL_TABLET | Freq: Four times a day (QID) | ORAL | Status: DC | PRN

## 2019-07-10 MED ORDER — INSULIN GLARGINE 100 UNIT/ML ~~LOC~~ SOLN
30.0000 [IU] | Freq: Two times a day (BID) | SUBCUTANEOUS | Status: DC
  Filled 2019-07-10 (×2): qty 0.3

## 2019-07-10 MED ORDER — SODIUM CHLORIDE 0.9% FLUSH: 3.0000 mL | Freq: Two times a day (BID) | INTRAVENOUS | Status: DC

## 2019-07-10 MED ORDER — ALBUTEROL SULFATE (2.5 MG/3ML) 0.083% IN NEBU: 2.5000 mg | INHALATION_SOLUTION | RESPIRATORY_TRACT | Status: DC | PRN

## 2019-07-10 MED ORDER — ENOXAPARIN SODIUM 40 MG/0.4ML ~~LOC~~ SOLN
40.0000 mg | SUBCUTANEOUS | Status: DC
  Administered 2019-07-10: 40 mg via SUBCUTANEOUS
  Filled 2019-07-10: qty 0.4

## 2019-07-10 MED ORDER — LORAZEPAM 1 MG PO TABS: 2.0000 mg | ORAL_TABLET | Freq: Four times a day (QID) | ORAL | Status: DC | PRN

## 2019-07-10 MED ORDER — NOVOLOG FLEXPEN 100 UNIT/ML ~~LOC~~ SOPN: 1.0000 [IU] | PEN_INJECTOR | Freq: Three times a day (TID) | SUBCUTANEOUS | 11 refills | Status: DC

## 2019-07-10 MED ORDER — INSULIN REGULAR(HUMAN) IN NACL 100-0.9 UT/100ML-% IV SOLN
INTRAVENOUS | Status: DC
  Administered 2019-07-10: 13 [IU]/h via INTRAVENOUS
  Filled 2019-07-10: qty 100

## 2019-07-10 NOTE — Discharge Summary (Signed)
Physician [name] [name], [name] [name] ASAP  Discharge Condition: Guarded CODE STATUS: Full code Diet recommendation: Diabetic  HPI: Per admitting MD, [name] [name]. is a 19 y.o. male with medical history significant of type 1 diabetes mellitus, epilepsy, migraine headaches, depression, oppositional defiant disorder, asthma, anxiety presented to the ED with elevated blood sugars.  History obtained from the [name]. Notes he gets his insulin from the ACT team.  He ran out of NovoLog and has not been able to get refills.  Per [name] he has continued to take Lantus 30 units twice a day with last dose being yesterday morning.  His NovoLog dose is based on carb counting-1 unit for 15 g but he has not taken NovoLog for days now.  His blood sugars in the last few days have been too high to read on the glucometer.  Notes otherwise he keeps his blood sugars between 80 and 183. He denies fever, abdominal pain, nausea, vomiting, shortness of breath, cough.  Says he gets muscle cramps when his sugars are elevated. He does not have a [name].  When he turned 16, he was unable to see his pediatrician following which he went to prison and since then he has not had a primary care physician. Denies using any street drugs including cocaine or IV drugs.  Denies drinking alcohol or smoking cigarettes.  Denies any contact with COVID-19 positive person.  Hospital Course / Discharge diagnoses:  [name] was admitted to the hospital with DKA, placed on insulin infusion.  As soon as his anion gap is closed and his bicarb is normalized, did not want to wait to be weaned off insulin infusion and transition to subcutaneous insulin and left the hospital  [name] MEDICAL ADVICE. [name] expressed desire to leave the Hospital immidiately, [name] has been warned that this is not Medically advisable at this time, and can result in Medical complications like Death and Disability, [name] understands and accepts the risks involved and assumes full responsibilty of this decision.Prescription for his Novolog and Lantus have been electronically sent to his pharmacy.   Discharge Instructions   Allergies as of 07/10/2019      Reactions   Bee Venom Anaphylaxis   Fish Allergy Anaphylaxis   Shellfish Allergy Anaphylaxis   Tea Anaphylaxis    Med Rec must be completed prior to using this [facility]      The results of significant diagnostics from this hospitalization (including imaging, microbiology, ancillary and laboratory) are listed below for reference.     Microbiology: Recent Results (from the past 240 hour(s))  SARS CORONAVIRUS 2 (TAT 6-24 HRS) Nasopharyngeal Nasopharyngeal Swab     Status: None   Collection Time: 07/10/19  6:16 AM   Specimen: Nasopharyngeal Swab  Result Value Ref Range Status   SARS Coronavirus 2 NEGATIVE NEGATIVE Final    Comment: (NOTE) SARS-CoV-2 target nucleic acids are NOT DETECTED. The SARS-CoV-2 RNA is generally detectable in upper and lower respiratory specimens during the acute phase of infection. Negative results do not preclude SARS-CoV-2 infection, do not rule out co-infections with other pathogens, and should not be used as the sole basis for treatment or other [name] management decisions. Negative results must be combined with clinical observations, [name] history, and epidemiological information. The  expected result is Negative. Fact Sheet for Patients: SugarRoll.be Fact Sheet for Healthcare Providers: https://www.woods-mathews.com/ This test is not yet approved or cleared by the [place] FDA and  has been authorized for detection and/or diagnosis of  SARS-CoV-2 by FDA under an Emergency Use Authorization (EUA). This EUA will remain  in effect (meaning this test can be used) for the duration of the COVID-19 declaration under Section 56 4(b)(1) of the Act, 21 U.S.C. section 360bbb-3(b)(1), unless the authorization is terminated or revoked sooner. Performed at [facility], [place] [address] [place], [place] [address]      Labs: Basic Metabolic Panel: Recent Labs  Lab 07/09/19 2209 07/10/19 0741 07/10/19 1220  NA 126* 135 138  K 4.2 3.2* 3.6  CL 85* 103 104  CO2 19* 21* 22  GLUCOSE 814* 281* 200*  BUN 23* 18 12  CREATININE 0.90 0.74 0.57*  CALCIUM 9.0 8.0* 7.9*   Liver Function Tests: [name] results for input(s): AST, ALT, ALKPHOS, BILITOT, PROT, ALBUMIN in the last 168 hours. CBC: Recent Labs  Lab 07/09/19 2209  WBC 8.0  HGB 15.0  HCT 44.6  MCV 84.0  PLT 355   CBG: Recent Labs  Lab 07/10/19 0739 07/10/19 0919 07/10/19 1038 07/10/19 1217 07/10/19 1411  GLUCAP 227* 237* 214* 193* 166*   Hgb A1c [name] results for input(s): HGBA1C in the last 72 hours. Lipid Profile [name] results for input(s): CHOL, HDL, LDLCALC, TRIG, CHOLHDL, LDLDIRECT in the last 72 hours. Thyroid function studies [name] results for input(s): TSH, T4TOTAL, T3FREE, THYROIDAB in the last 72 hours.  Invalid input(s): FREET3 Urinalysis    Component Value Date/Time   COLORURINE COLORLESS (A) 07/09/2019 2209   APPEARANCEUR CLEAR 07/09/2019 2209   LABSPEC 1.022 07/09/2019 2209   PHURINE 6.0 07/09/2019 2209   GLUCOSEU >=500 (A) 07/09/2019 2209   HGBUR NEGATIVE 07/09/2019 2209   BILIRUBINUR NEGATIVE 07/09/2019 2209   KETONESUR 20 (A) 07/09/2019 2209   PROTEINUR NEGATIVE 07/09/2019 2209   UROBILINOGEN 0.2 03/13/2015 0123   NITRITE NEGATIVE 07/09/2019 2209   LEUKOCYTESUR NEGATIVE 07/09/2019 2209    FURTHER DISCHARGE INSTRUCTIONS:   Get Medicines reviewed and adjusted: Please take all your medications with you for your next visit with your  Primary MD   Laboratory/radiological data: Please request your Primary MD to go over all hospital tests and procedure/radiological results at the follow up, please ask your Primary MD to get all Hospital records sent to his/her office.   In some cases, they will be blood work, cultures and biopsy results pending at the time of your discharge. Please request that your primary care M.D. goes through all the records of your hospital data and follows up on these results.   Also Note the following: If you experience worsening of your admission symptoms, develop shortness of breath, life threatening emergency, suicidal or homicidal thoughts you must seek medical attention immediately by calling 911 or calling your MD immediately  if symptoms less severe.   You must read complete instructions/literature along with all the possible adverse reactions/side effects for all the Medicines you take and that have been prescribed to you. Take any new Medicines after you have completely understood and accpet all the possible adverse reactions/side effects.    Do not drive when taking Pain medications or sleeping medications (Benzodaizepines)   Do not take more than prescribed Pain, Sleep and Anxiety Medications. It is not advisable to combine anxiety,sleep and pain medications without talking with your primary care practitioner  Special Instructions: If you have smoked or chewed Tobacco  in the last 2 yrs please stop smoking, stop any regular Alcohol  and or any Recreational drug use.   Wear Seat belts while driving.   Please note: You were cared for by a hospitalist during your hospital stay. Once you are discharged, your primary care physician will handle any further medical issues. Please note that [name] REFILLS for any discharge medications will be authorized once you are discharged, as it is imperative that you return to your primary care physician (or establish a relationship with a primary care physician if  you do not have one) for your post hospital discharge needs so that they can reassess your need for medications and monitor your lab values.  Time coordinating discharge: 10 minutes  SIGNED:  Pamella Pert, MD, PhD 07/10/2019, 2:59 PM

## 2019-07-10 NOTE — Progress Notes (Signed)
Patient seen and examined this morning, just admitted by Dr. Allena Katz.  H&P reviewed, and agree with the assessment and plan.  19 year old male with type I DM, epilepsy, migraine headaches, depression, oppositional defiant disorder, asthma, anxiety came to the ED with hyperglycemia.  He ran out of NovoLog and has not been able to get refills.  He has continued to take his home Lantus 30 twice daily but his blood sugars in the last several days have been too high to read and came to the hospital.  No abdominal pain, nausea or vomiting.  DKA -Continue insulin infusion, monitor gap, transition to subcutaneous when meets parameters -will receive refills when he is discharged  [name] [name] M. Elvera Lennox, MD, PhD [facility]  Between 7 am - 7 pm I am available, please contact me via Amion or Securechat Between 7 pm - 7 am I am not available, please contact night coverage MD/APP via Amion

## 2019-07-10 NOTE — ED Provider Notes (Signed)
[name] [name] Provider Note   CSN: [id number] Arrival date & time: 07/09/19  2123     History Chief Complaint  Patient presents with  . Hyperglycemia    [name] [name]. is a 19 y.o. male.  The history is provided by the patient.  Hyperglycemia Severity:  Moderate Onset quality:  Gradual Timing:  Constant Progression:  Worsening Chronicity:  Recurrent Context: noncompliance   Relieved by:  None tried Ineffective treatments:  None tried Associated symptoms: no fever and no vomiting   Patient with history of diabetes, seizures, depression presents with hyperglycemia He reports due to miscommunication with his ACT team and PCP, he has not had NovoLog in quite some time. He has had Lantus but not been using.  Therefore has not been treating his glucose recently.  No fevers or vomiting.  He had mild right flank pain that is common when he is hyperglycemic     Past Medical History:  Diagnosis Date  . ADD (attention deficit disorder)   . Allergy   . Anxiety   . Asthma   . Depression   . Epileptic seizure ([place])    "both petit and grand mal; last sz ~ 2 wk ago" (06/17/2013)  . Headache(784.0)    "2-3 times/wk usually" (06/17/2013)  . Heart murmur    "heard a slight one earlier today" (06/17/2013)  . Homeless   . Migraine    "maybe once/month; it's severe" (06/17/2013)  . ODD (oppositional defiant disorder)   . Sickle cell trait ([place])   . Type I diabetes mellitus ([place])    "that's what they're thinking now" (06/17/2013)  . Vision abnormalities    "takes him longer to focus cause; from his sz" (06/17/2013)    Patient Active Problem List   Diagnosis Date Noted  . 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 AKI (acute kidney injury) ([place]) 09/15/2018  . URI (upper respiratory infection) 09/15/2018  . Adjustment disorder with mixed disturbance of emotions and conduct   . Atypical chest pain 08/19/2018  . Borderline personality  disorder ([place]) 02/26/2018  . 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 Major depressive disorder, recurrent severe without psychotic features ([place]) 07/24/2017  . DKA, type 1 ([place]) 07/16/2017  . Nausea & vomiting 07/16/2017  . Uncontrolled type 1 diabetes circulatory disorder erectile dysfunction ([place])   . DKA (diabetic ketoacidoses) ([place]) 12/18/2016  . History of seizures 12/01/2016  . History of migraine 12/01/2016  . Disordered eating 03/08/2015  . Ketonuria   . Adjustment reaction to medical therapy   . Non compliance w medication regimen   . Diabetic peripheral neuropathy associated with type 1 diabetes mellitus ([place]) 09/29/2014  . Dehydration 08/22/2014  . Hyperglycemia due to type 1 diabetes mellitus ([place]) 08/21/2014  . Type 1 diabetes mellitus with hyperglycemia ([place])   . Noncompliance   . Generalized abdominal pain   . Glycosuria   . Depression 07/12/2014  . Involuntary movements 06/13/2014  . Bilateral leg pain 06/13/2014  . 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 Suicidal ideations 03/31/2014  . ODD (oppositional defiant disorder) 03/03/2014  . MDD (major depressive disorder), recurrent episode, moderate ([place]) 02/16/2014  . Attention deficit hyperactivity disorder (ADHD), combined type, moderate 02/16/2014  . Microcytic anemia 02/15/2014  . Hyperglycemia 02/14/2014  . Goiter 02/04/2014  . 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[place]) 02/04/2014  . Acquired acanthosis nigricans 02/04/2014  . Obesity, morbid ([place]) 02/04/2014  .  Insulin resistance 02/04/2014  . Hyperinsulinemia 02/04/2014  . Hypoglycemia associated with diabetes ([place]) 02/02/2014  . Maladaptive health behaviors affecting medical condition 02/02/2014  . Hypoglycemia 02/02/2014  . Type 1 diabetes mellitus in patient 68 to 21 years of age with hemoglobin A1c goal of less than 7.5% ([place])  01/26/2014  . Partial epilepsy with impairment of consciousness ([place]) 12/13/2013  . 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 Migraine without aura 12/13/2013  . Body mass index, pediatric, greater than or equal to 95th percentile for age 09/12/2013  . Asthma 12/13/2013  . Hypoglycemia unawareness in type 1 diabetes mellitus ([place]) 08/08/2013  . Seizure disorder ([place]) 07/06/2013  . Short-term memory loss 07/06/2013  . Sickle cell trait ([place]) 07/06/2013  . Diabetes ([place]) 06/17/2013  . Diabetes mellitus, new onset ([place]) 06/17/2013    Past Surgical History:  Procedure Laterality Date  . CIRCUMCISION  2000  . FINGER SURGERY Left 2001   "crushed pinky; had to repair it" (06/17/2013)       Family History  Problem Relation Age of Onset  . Asthma Mother   . COPD Mother   . Other Mother        possible autoimmune, unclear  . Diabetes Maternal Grandmother   . Heart disease Maternal Grandmother   . Hypertension Maternal Grandmother   . Mental illness Maternal Grandmother   . Heart disease Maternal Grandfather   . Hyperlipidemia Paternal Grandmother   . Hyperlipidemia Paternal Grandfather   . Migraines Sister        Hemiplegic Migraines     Social History   Tobacco Use  . Smoking status: Passive Smoke Exposure - Never Smoker  . Smokeless tobacco: Never Used  . Tobacco comment: Mom and dad smoke outside   Substance Use Topics  . Alcohol use: No    Alcohol/week: 0.0 standard drinks  . Drug use: Not Currently    Home Medications Prior to Admission medications   Medication Sig Start Date End Date Taking? Authorizing Provider  albuterol (PROVENTIL HFA;VENTOLIN HFA) 108 (90 Base) MCG/ACT inhaler Inhale 1 puff into the lungs every 4 (four) hours as needed for wheezing or shortness of breath. 09/16/18   Ghimire, Henreitta Leber, MD  ARIPiprazole (ABILIFY) 5 MG tablet Take 5 mg by mouth at bedtime.  02/05/19   [provider]  blood glucose meter kit and supplies KIT  Dispense based on patient and insurance preference. Use up to four times daily as directed. (FOR ICD-9 250.00, 250.01). 04/22/19   Robinson, [place] N, PA-C  blood glucose meter kit and supplies Dispense based on patient and insurance preference. Use up to four times daily as directed. (FOR ICD-10 E10.9, E11.9). 09/16/18   Ghimire, Henreitta Leber, MD  buPROPion (WELLBUTRIN SR) 150 MG 12 hr tablet Take 150 mg by mouth daily. 02/05/19   [provider]  FLUoxetine (PROZAC) 20 MG capsule Take 1 capsule (20 mg total) by mouth daily. 09/20/18   Patrecia Pour, NP  glucose blood (FREESTYLE TEST STRIPS) test strip Use as instructed 09/16/18   Ghimire, Henreitta Leber, MD  insulin aspart (NOVOLOG FLEXPEN) 100 UNIT/ML FlexPen 1 unit/g of carbohydrate sliding scale. Patient taking differently: Inject 1-15 Units into the skin 3 (three) times daily with meals. 1 unit/g of carbohydrate sliding scale. 11/28/18   [name] Speak, MD  Insulin Glargine (LANTUS) 100 UNIT/ML Solostar Pen Inject 30 Units into the skin 2 (two) times daily. 11/28/18   [name] Speak, MD  Insulin Pen Needle 32G X 8 MM  MISC Use as directed 10/10/18   Kinnie Feil, PA-C  LORazepam (ATIVAN) 2 MG tablet Take 2 mg by mouth every 6 (six) hours as needed for anxiety.    [provider]  traZODone (DESYREL) 100 MG tablet Take 1 tablet (100 mg total) by mouth at bedtime as needed for sleep. Patient taking differently: Take 100 mg by mouth at bedtime.  09/19/18   Patrecia Pour, NP    Allergies    Bee venom, Fish allergy, Shellfish allergy, and Tea  Review of Systems   Review of Systems  Constitutional: Negative for fever.  Gastrointestinal: Negative for vomiting.  All other systems reviewed and are negative.   Physical Exam Updated Vital Signs BP (!) 156/99 (BP Location: Left Arm)   Pulse 88   Temp 98.8 F (37.1 C) (Oral)   Resp 16   SpO2 99%   Physical Exam CONSTITUTIONAL: Well developed/well nourished, no distress HEAD:  Normocephalic/atraumatic EYES: EOMI ENMT: Mask in place NECK: supple no meningeal signs SPINE/BACK:entire spine nontender CV: S1/S2 noted, no murmurs/rubs/gallops noted LUNGS: Lungs are clear to auscultation bilaterally, no apparent distress ABDOMEN: soft, nontender, no rebound or guarding, bowel sounds noted throughout abdomen GU:no cva tenderness NEURO: Pt is awake/alert/appropriate, moves all extremitiesx4.  No facial droop.   EXTREMITIES: pulses normal/equal, full ROM SKIN: warm, color normal PSYCH: no abnormalities of mood noted, alert and oriented to situation  ED Results / Procedures / Treatments   Labs (all labs ordered are listed, but only abnormal results are displayed) Labs Reviewed  BASIC METABOLIC PANEL - Abnormal; Notable for the following components:      Result Value   Sodium 126 (*)    Chloride 85 (*)    CO2 19 (*)    Glucose, Bld 814 (*)    BUN 23 (*)    Anion gap 22 (*)    All other components within normal limits  URINALYSIS, ROUTINE W REFLEX MICROSCOPIC - Abnormal; Notable for the following components:   Color, Urine COLORLESS (*)    Glucose, UA >=500 (*)    Ketones, ur 20 (*)    All other components within normal limits  BETA-HYDROXYBUTYRIC ACID - Abnormal; Notable for the following components:   Beta-Hydroxybutyric Acid 4.35 (*)    All other components within normal limits  CBG MONITORING, ED - Abnormal; Notable for the following components:   Glucose-Capillary >600 (*)    All other components within normal limits  SARS CORONAVIRUS 2 (TAT 6-24 HRS)  CBC  BASIC METABOLIC PANEL  BASIC METABOLIC PANEL  BASIC METABOLIC PANEL  BASIC METABOLIC PANEL  BETA-HYDROXYBUTYRIC ACID  BETA-HYDROXYBUTYRIC ACID  BLOOD GAS, VENOUS  CBG MONITORING, ED  CBG MONITORING, ED    EKG None  Radiology No results found.  Procedures .Critical Care Performed by: Ripley Fraise, MD Authorized by: Ripley Fraise, MD   Critical care provider statement:     Critical care time (minutes):  45   Critical care start time:  07/10/2019 4:00 AM   Critical care end time:  07/10/2019 4:45 AM   Critical care time was exclusive of:  Separately billable procedures and treating other patients   Critical care was necessary to treat or prevent imminent or life-threatening deterioration of the following conditions:  Endocrine crisis and dehydration   Critical care was time spent personally by me on the following activities:  Pulse oximetry, re-evaluation of patient's condition, ordering and review of laboratory studies, ordering and performing treatments and interventions, evaluation of patient's response to  treatment, examination of patient, discussions with consultants and development of treatment plan with patient or surrogate   I assumed direction of critical care for this patient from another provider in my specialty: no       Medications Ordered in ED Medications  insulin regular, human (MYXREDLIN) 100 units/ 100 mL infusion (13 Units/hr Intravenous New Bag/Given 07/10/19 0439)  0.9 %  sodium chloride infusion ( Intravenous Hold 07/10/19 0416)  dextrose 5 %-0.45 % sodium chloride infusion ( Intravenous Hold 07/10/19 0415)  dextrose 50 % solution 0-50 mL (has no administration in time range)  potassium chloride 10 mEq in 100 mL IVPB (10 mEq Intravenous New Bag/Given 07/10/19 0425)  sodium chloride 0.9 % bolus 2,000 mL (2,000 mLs Intravenous New Bag/Given 07/10/19 0425)    ED Course  I have reviewed the triage vital signs and the nursing notes.  Pertinent labs  results that were available during my care of the patient were reviewed by me and considered in my medical decision making (see chart for details).    MDM Rules/Calculators/A&P                      4:11 AM Patient with history of diabetes presents here with hyperglycemia.  He has had issues getting his insulin as an outpatient, and there is likely contributed to this episode.  Glucose over 800, he has  evidence of DKA with anion gap of 22.  He will require admission 5:08 AM Discussed with Dr. Graylon Gunning for admission.  Patient stable at this time.  He require IV fluids and IV insulin Final Clinical Impression(s) / ED Diagnoses Final diagnoses:  Dehydration  Diabetic ketoacidosis without coma associated with type 1 diabetes mellitus [facility])    Rx / DC Orders ED Discharge Orders    None       Ripley Fraise, MD 07/10/19 [phone]

## 2019-07-10 NOTE — ED Notes (Signed)
Pt called for rooming d/t critical lab value. Patient did not answer. Will try again soon.

## 2019-07-10 NOTE — ED Notes (Signed)
Pt states that he went home and will be back in 10 mins. Due to lab work, will hold a room for him. Provider aware.

## 2019-07-10 NOTE — ED Notes (Signed)
Pt did not want to stay. He has things at home he is anxious about. He says he can give himself lantus at home.

## 2019-07-10 NOTE — H&P (Addendum)
History and Physical    [name] [name]. LOV:[id number] DOB: [birth year] DOA: 07/10/2019  PCP: Patient, No Pcp Per  Patient coming from: Home (lives with fianc)  I have personally briefly reviewed patient's old medical records in [place]  Chief Complaint: Elevated blood sugars  HPI: [name] [name]. is a 19 y.o. male with medical history significant of type 1 diabetes mellitus, epilepsy, migraine headaches, depression, oppositional defiant disorder, asthma, anxiety presented to the ED with elevated blood sugars.  History obtained from the patient.  Notes he gets his insulin from the [facility] team.  He ran out of NovoLog and has not been able to get refills.  Per patient he has continued to take Lantus 30 units twice a day with last dose being yesterday morning.  His NovoLog dose is based on carb counting-1 unit for 15 g but he has not taken NovoLog for days now.  His blood sugars in the last few days have been too high to read on the glucometer.  Notes otherwise he keeps his blood sugars between 80 and 183.  He denies fever, abdominal pain, nausea, vomiting, shortness of breath, cough.  Says he gets muscle cramps when his sugars are elevated.  He does not have a PCP.  When he turned 6, he was unable to see his pediatrician following which he went to prison and since then he has not had a primary care physician.  Denies using any street drugs including cocaine or IV drugs.  Denies drinking alcohol or smoking cigarettes.  Denies any contact with COVID-19 positive person.  (ED Course: While in the ED, he was found to have blood sugar elevated in the 800s, anion gap 23 and elevated beta hydroxybutyrate.  pH on VBG 7.3, sodium 126.  Rest of labs unremarkable.  SARS COVID-19 test pending.  He was started on regular IV insulin in the ED.  Hospitalist called for admission for DKA.  Review of Systems: As per HPI otherwise 10 point review of systems negative.   Past Medical History:    Diagnosis Date  . ADD (attention deficit disorder)   . Allergy   . Anxiety   . Asthma   . Depression   . Epileptic seizure ([place])    "both petit and grand mal; last sz ~ 2 wk ago" (06/17/2013)  . Headache(784.0)    "2-3 times/wk usually" (06/17/2013)  . Heart murmur    "heard a slight one earlier today" (06/17/2013)  . Homeless   . Migraine    "maybe once/month; it's severe" (06/17/2013)  . ODD (oppositional defiant disorder)   . Sickle cell trait ([place])   . Type I diabetes mellitus ([place])    "that's what they're thinking now" (06/17/2013)  . Vision abnormalities    "takes him longer to focus cause; from his sz" (06/17/2013)    Past Surgical History:  Procedure Laterality Date  . CIRCUMCISION  2000  . FINGER SURGERY Left 2001   "crushed pinky; had to repair it" (06/17/2013)     reports that he is a non-smoker but has been exposed to tobacco smoke. He has never used smokeless tobacco. He reports previous drug use. He reports that he does not drink alcohol.  Allergies  Allergen Reactions  . Bee Venom Anaphylaxis  . Fish Allergy Anaphylaxis  . Shellfish Allergy Anaphylaxis  . Tea Anaphylaxis    Family History  Problem Relation Age of Onset  . Asthma Mother   . COPD Mother   . Other  Mother        possible autoimmune, unclear  . Diabetes Maternal Grandmother   . Heart disease Maternal Grandmother   . Hypertension Maternal Grandmother   . Mental illness Maternal Grandmother   . Heart disease Maternal Grandfather   . Hyperlipidemia Paternal Grandmother   . Hyperlipidemia Paternal Grandfather   . Migraines Sister        Hemiplegic Migraines     Prior to Admission medications   Medication Sig Start Date End Date Taking? Authorizing Provider  albuterol (PROVENTIL HFA;VENTOLIN HFA) 108 (90 Base) MCG/ACT inhaler Inhale 1 puff into the lungs every 4 (four) hours as needed for wheezing or shortness of breath. 09/16/18   Ghimire, Henreitta Leber, MD  ARIPiprazole (ABILIFY) 5 MG  tablet Take 5 mg by mouth at bedtime.  02/05/19   [provider]  blood glucose meter kit and supplies KIT Dispense based on patient and insurance preference. Use up to four times daily as directed. (FOR ICD-9 250.00, 250.01). 04/22/19   Robinson, [place] N, PA-C  blood glucose meter kit and supplies Dispense based on patient and insurance preference. Use up to four times daily as directed. (FOR ICD-10 E10.9, E11.9). 09/16/18   Ghimire, Henreitta Leber, MD  buPROPion (WELLBUTRIN SR) 150 MG 12 hr tablet Take 150 mg by mouth daily. 02/05/19   [provider]  FLUoxetine (PROZAC) 20 MG capsule Take 1 capsule (20 mg total) by mouth daily. 09/20/18   Patrecia Pour, NP  glucose blood (FREESTYLE TEST STRIPS) test strip Use as instructed 09/16/18   Ghimire, Henreitta Leber, MD  insulin aspart (NOVOLOG FLEXPEN) 100 UNIT/ML FlexPen 1 unit/g of carbohydrate sliding scale. Patient taking differently: Inject 1-15 Units into the skin 3 (three) times daily with meals. 1 unit/g of carbohydrate sliding scale. 11/28/18   [name] Speak, MD  Insulin Glargine (LANTUS) 100 UNIT/ML Solostar Pen Inject 30 Units into the skin 2 (two) times daily. 11/28/18   [name] Speak, MD  Insulin Pen Needle 32G X 8 MM MISC Use as directed 10/10/18   Kinnie Feil, PA-C  LORazepam (ATIVAN) 2 MG tablet Take 2 mg by mouth every 6 (six) hours as needed for anxiety.    [provider]  traZODone (DESYREL) 100 MG tablet Take 1 tablet (100 mg total) by mouth at bedtime as needed for sleep. Patient taking differently: Take 100 mg by mouth at bedtime.  09/19/18   Patrecia Pour, NP    Physical Exam: Vitals:   07/09/19 2150 07/10/19 0428  BP: (!) 156/99 (!) 141/85  Pulse: 88 91  Resp: 16 (!) 21  Temp: 98.8 F (37.1 C)   TempSrc: Oral   SpO2: 99% 100%  Weight:  90.7 kg  Height:  5' 10.5" (1.791 m)    Constitutional: NAD, calm, comfortable Vitals:   07/09/19 2150 07/10/19 0428  BP: (!) 156/99 (!) 141/85  Pulse: 88 91    Resp: 16 (!) 21  Temp: 98.8 F (37.1 C)   TempSrc: Oral   SpO2: 99% 100%  Weight:  90.7 kg  Height:  5' 10.5" (1.791 m)   Eyes: PERRL, lids and conjunctivae normal ENMT: Mucous membranes are moist. Neck: normal, supple, no masses Respiratory: clear to auscultation bilaterally, no wheezing, no crackles. Normal respiratory effort. No accessory muscle use.  Cardiovascular: Regular rate and rhythm, murmur present. No extremity edema.  Abdomen: no tenderness, no masses palpated. No hepatosplenomegaly. Bowel sounds positive.  Musculoskeletal: no clubbing / cyanosis. No joint deformity upper and lower  extremities. Good ROM, no contractures. Normal muscle tone.  Skin: no rashes, lesions, ulcers.  Tattoos present. Neurologic: CN 2-12 grossly intact. Sensation intact, DTR normal. Strength 5/5 in all 4.  Psychiatric:  Alert and oriented x 3. Normal mood.   Labs on Admission: I have personally reviewed following labs and imaging studies  CBC: Recent Labs  Lab 07/09/19 2209  WBC 8.0  HGB 15.0  HCT 44.6  MCV 84.0  PLT 756   Basic Metabolic Panel: Recent Labs  Lab 07/09/19 2209  NA 126*  K 4.2  CL 85*  CO2 19*  GLUCOSE 814*  BUN 23*  CREATININE 0.90  CALCIUM 9.0   GFR: Estimated Creatinine Clearance: 149.6 mL/min (by C-G formula based on SCr of 0.9 mg/dL). Liver Function Tests: No results for input(s): AST, ALT, ALKPHOS, BILITOT, PROT, ALBUMIN in the last 168 hours. No results for input(s): LIPASE, AMYLASE in the last 168 hours. No results for input(s): AMMONIA in the last 168 hours. Coagulation Profile: No results for input(s): INR, PROTIME in the last 168 hours. Cardiac Enzymes: No results for input(s): CKTOTAL, CKMB, CKMBINDEX, TROPONINI in the last 168 hours. BNP (last 3 results) No results for input(s): PROBNP in the last 8760 hours. HbA1C: No results for input(s): HGBA1C in the last 72 hours. CBG: Recent Labs  Lab 07/09/19 2149  GLUCAP >600*   Lipid  Profile: No results for input(s): CHOL, HDL, LDLCALC, TRIG, CHOLHDL, LDLDIRECT in the last 72 hours. Thyroid Function Tests: No results for input(s): TSH, T4TOTAL, FREET4, T3FREE, THYROIDAB in the last 72 hours. Anemia Panel: No results for input(s): VITAMINB12, FOLATE, FERRITIN, TIBC, IRON, RETICCTPCT in the last 72 hours. Urine analysis:    Component Value Date/Time   COLORURINE COLORLESS (A) 07/09/2019 2209   APPEARANCEUR CLEAR 07/09/2019 2209   LABSPEC 1.022 07/09/2019 2209   PHURINE 6.0 07/09/2019 2209   GLUCOSEU >=500 (A) 07/09/2019 2209   HGBUR NEGATIVE 07/09/2019 2209   BILIRUBINUR NEGATIVE 07/09/2019 2209   KETONESUR 20 (A) 07/09/2019 2209   PROTEINUR NEGATIVE 07/09/2019 2209   UROBILINOGEN 0.2 03/13/2015 0123   NITRITE NEGATIVE 07/09/2019 2209   LEUKOCYTESUR NEGATIVE 07/09/2019 2209    Radiological Exams on Admission: No results found.  EKG: Independently reviewed.   Assessment/Plan Principal Problem:   DKA (diabetic ketoacidoses) (HCC) Active Problems:   Epilepsy ([place])   Anxiety   Hyponatremia  Diabetic ketoacidosis Blood glucose elevated in the 800s, anion gap 23.  Potassium 4.2 Triggering event likely noncompliance.  No obvious infectious etiology identified. Afebrile, hemodynamically stable.  No leukocytosis.  Will get UDS. Continue IV insulin per Endo tool protocol Continue normal saline with 20 mEq KCl.  When blood glucose less than 250 switch to D5-1/2 NS BMP every 4 TOC consulted to assist with post discharge medication need  Pseudohyponatremia Sodium 126.  Corrected for hyperglycemia sodium 142 (with correction factor 2.4)  Anxiety/depression Continue Abilify.  Hold Wellbutrin as it is relatively contraindicated in patients with seizure disorder. Ativan, hydroxyzine as needed for anxiety  DVT prophylaxis: Lovenox SQ Code Status: Full code  Family Communication: Plan discussed with patient Disposition Plan: Discharge home once DKA  resolves Consults called: None Admission status: Inpatient  I certify this patient needs inpatient stay for DKA requiring IV insulin.  Lucky Cowboy MD [facility]  If 7PM-7AM, please contact night-coverage 07/10/2019, 5:06 AM

## 2019-07-10 NOTE — ED Notes (Signed)
Pt has been called 3x and lobby has been checked. No response.

## 2019-07-10 NOTE — ED Notes (Signed)
Will call patient on his cell phone. 

## 2019-07-11 ENCOUNTER — Ambulatory Visit: Payer: Medicaid Other | Admitting: Internal Medicine

## 2019-07-11 NOTE — Progress Notes (Deleted)
Name: [name] [name].  MRN/ DOB: [id number], 06-May-2001   Age/ Sex: 19 y.o., male    PCP: Patient, No Pcp Per   Reason for Endocrinology Evaluation: Type 1 Diabetes Mellitus     Date of Initial Endocrinology Visit: 07/11/2019     PATIENT IDENTIFIER: [name] is a 19 y.o. male with a past medical history of T1DM, epilepsy, migraine headaches, anxiety and depression and chronic non-compliance. The patient presented for initial endocrinology clinic visit on 07/11/2019 for consultative assistance with his diabetes management.    HPI: [name] [name] was    Diagnosed with T1DM on 06/17/2013. Antibodies to GAD and pancreatic islet cells were positive.  Prior Medications tried/Intolerance: *** Currently checking blood sugars *** x / day,  before breakfast and ***.  Hypoglycemia episodes : ***               Symptoms: ***                 Frequency: ***/  Hemoglobin A1c has ranged from *** in ***, peaking at *** in ***. Patient required assistance for hypoglycemia:  Patient has required hospitalization within the last 1 year from hyper or hypoglycemia: for DKA 07/2019. Multiple visits to the ED in the past year.    In terms of diet, the patient ***   HOME DIABETES REGIMEN: Basal: ***  Bolus: ***   Statin: {Yes/No:11203} ACE-I/ARB: {YES/NO:17245} Prior Diabetic Education: {Yes/No:11203}   METER DOWNLOAD SUMMARY: Date range evaluated: *** Fingerstick Blood Glucose Tests = *** Average Number Tests/Day = *** Overall Mean FS Glucose = *** Standard Deviation = ***  BG Ranges: Low = *** High = ***   Hypoglycemic Events/30 Days: BG < 50 = *** Episodes of symptomatic severe hypoglycemia = ***   DIABETIC COMPLICATIONS: Microvascular complications:   ***  Denies: ***  Last eye exam: Completed   Macrovascular complications:   ***  Denies: CAD, PVD, CVA   PAST HISTORY: Past Medical History:  Past Medical History:  Diagnosis Date  . ADD (attention  deficit disorder)   . Allergy   . Anxiety   . Asthma   . Depression   . Epileptic seizure ([place])    "both petit and grand mal; last sz ~ 2 wk ago" (06/17/2013)  . Headache(784.0)    "2-3 times/wk usually" (06/17/2013)  . Heart murmur    "heard a slight one earlier today" (06/17/2013)  . Homeless   . Migraine    "maybe once/month; it's severe" (06/17/2013)  . ODD (oppositional defiant disorder)   . Sickle cell trait ([place])   . Type I diabetes mellitus ([place])    "that's what they're thinking now" (06/17/2013)  . Vision abnormalities    "takes him longer to focus cause; from his sz" (06/17/2013)    Past Surgical History:  Past Surgical History:  Procedure Laterality Date  . CIRCUMCISION  2000  . FINGER SURGERY Left 2001   "crushed pinky; had to repair it" (06/17/2013)      Social History:  reports that he is a non-smoker but has been exposed to tobacco smoke. He has never used smokeless tobacco. He reports previous drug use. He reports that he does not drink alcohol. Family History:  Family History  Problem Relation Age of Onset  . Asthma Mother   . COPD Mother   . Other Mother        possible autoimmune, unclear  . Diabetes Maternal Grandmother   . Heart disease Maternal  Grandmother   . Hypertension Maternal Grandmother   . Mental illness Maternal Grandmother   . Heart disease Maternal Grandfather   . Hyperlipidemia Paternal Grandmother   . Hyperlipidemia Paternal Grandfather   . Migraines Sister        Hemiplegic Migraines       HOME MEDICATIONS: Allergies as of 07/11/2019      Reactions   Bee Venom Anaphylaxis   Fish Allergy Anaphylaxis   Shellfish Allergy Anaphylaxis   Tea Anaphylaxis      Medication List       Accurate as of July 11, 2019  7:28 AM. If you have any questions, ask your nurse or doctor.        albuterol 108 (90 Base) MCG/ACT inhaler Commonly known as: VENTOLIN HFA Inhale 1 puff into the lungs every 4 (four) hours as needed for  wheezing or shortness of breath.   blood glucose meter kit and supplies Dispense based on patient and insurance preference. Use up to four times daily as directed. (FOR ICD-10 E10.9, E11.9).   blood glucose meter kit and supplies Kit Dispense based on patient and insurance preference. Use up to four times daily as directed. (FOR ICD-9 250.00, 250.01).   FLUoxetine 20 MG capsule Commonly known as: PROZAC Take 1 capsule (20 mg total) by mouth daily.   glucose blood test strip Commonly known as: FREESTYLE TEST STRIPS Use as instructed   hydrOXYzine 25 MG tablet Commonly known as: ATARAX/VISTARIL Take 25 mg by mouth every 6 (six) hours as needed for anxiety.   Insulin Glargine 100 UNIT/ML Solostar Pen Commonly known as: LANTUS Inject 30 Units into the skin 2 (two) times daily.   Insulin Pen Needle 32G X 8 MM Misc Use as directed   LORazepam 2 MG tablet Commonly known as: ATIVAN Take 2 mg by mouth every 6 (six) hours as needed for anxiety.   NovoLOG FlexPen 100 UNIT/ML FlexPen Generic drug: insulin aspart Inject 1-15 Units into the skin 3 (three) times daily with meals. 1 unit/g of carbohydrate sliding scale.   traZODone 100 MG tablet Commonly known as: DESYREL Take 1 tablet (100 mg total) by mouth at bedtime as needed for sleep.        ALLERGIES: Allergies  Allergen Reactions  . Bee Venom Anaphylaxis  . Fish Allergy Anaphylaxis  . Shellfish Allergy Anaphylaxis  . Tea Anaphylaxis     REVIEW OF SYSTEMS: A comprehensive ROS was conducted with the patient and is negative except as per HPI and below:  ROS    OBJECTIVE:   VITAL SIGNS: There were no vitals taken for this visit.   PHYSICAL EXAM:  General: Pt appears well and is in NAD  Hydration: Well-hydrated with moist mucous membranes and good skin turgor  HEENT: Head: Unremarkable with good dentition. Oropharynx clear without exudate.  Eyes: External eye exam normal without stare, lid lag or exophthalmos.   EOM intact.  PERRL.  Neck: General: Supple without adenopathy or carotid bruits. Thyroid: Thyroid size normal.  No goiter or nodules appreciated. No thyroid bruit.  Lungs: Clear with good BS bilat with no rales, rhonchi, or wheezes  Heart: RRR with normal S1 and S2 and no gallops; no murmurs; no rub  Abdomen: Normoactive bowel sounds, soft, nontender, without masses or organomegaly palpable  Extremities:  Lower extremities - No pretibial edema. No lesions.  Skin: Normal texture and temperature to palpation. No rash noted. No Acanthosis nigricans/skin tags. No lipohypertrophy.  Neuro: MS is good with appropriate affect,  pt is alert and Ox3    DM foot exam:    DATA REVIEWED:  Lab Results  Component Value Date   HGBA1C 11.5 (H) 08/09/2018   HGBA1C 10.8 (H) 04/05/2018   HGBA1C 10.1 (H) 01/26/2017   Lab Results  Component Value Date   CREATININE 0.57 (L) 07/10/2019   No results found for: MICRALBCREAT  Lab Results  Component Value Date   TRIG 244 (H) 12/18/2016        ASSESSMENT / PLAN / RECOMMENDATIONS:   1) Type *** Diabetes Mellitus, ***controlled, With*** complications - Most recent A1c of *** %. Goal A1c < *** %.  ***  Plan: GENERAL:  ***  MEDICATIONS:  ***  EDUCATION / INSTRUCTIONS:  BG monitoring instructions: Patient is instructed to check his blood sugars *** times a day, ***.  Call [facility] if: BG persistently < 70 or > 300. . I reviewed the Rule of 15 for the treatment of hypoglycemia in detail with the patient. Literature supplied.   2) Diabetic complications:   Eye: Does *** have known diabetic retinopathy.   Neuro/ Feet: Does *** have known diabetic peripheral neuropathy.  Renal: Patient does *** have known baseline CKD. He is *** on an ACEI/ARB at present.Check urine albumin/creatinine ratio yearly starting at time of diagnosis. If albuminuria is positive, treatment is geared toward better glucose, blood pressure control and  use of ACE inhibitors or ARBs. Monitor electrolytes and creatinine once to twice yearly.   3) Lipids: Patient is *** on a statin.    4) Hypertension: ***  at goal of < 140/90 mmHg.       Signed electronically by: Mack Guise, MD  [facility]  [facility] [address], [place] [address] Phone: [phone] FAX: [phone]   CC: Patient, No Pcp Per No address on file Phone: None  Fax: None    Return to Endocrinology clinic as below: Future Appointments  Date Time Provider [place]  07/11/2019  8:30 AM Rodderick Holtzer, Melanie Crazier, MD [id number] None

## 2019-08-29 DIAGNOSIS — J4520 Mild intermittent asthma, uncomplicated: Secondary | ICD-10-CM | POA: Insufficient documentation

## 2019-08-29 DIAGNOSIS — E109 Type 1 diabetes mellitus without complications: Secondary | ICD-10-CM | POA: Insufficient documentation

## 2019-08-29 DIAGNOSIS — E1010 Type 1 diabetes mellitus with ketoacidosis without coma: Secondary | ICD-10-CM | POA: Insufficient documentation

## 2019-08-29 DIAGNOSIS — W19XXXA Unspecified fall, initial encounter: Secondary | ICD-10-CM | POA: Insufficient documentation

## 2019-09-18 ENCOUNTER — Encounter (HOSPITAL_COMMUNITY): Payer: Self-pay

## 2019-09-18 ENCOUNTER — Other Ambulatory Visit: Payer: Self-pay

## 2019-09-18 ENCOUNTER — Ambulatory Visit (HOSPITAL_COMMUNITY)
Admission: EM | Admit: 2019-09-18 | Discharge: 2019-09-18 | Disposition: A | Discharge status: Home / Self Care | Payer: Medicaid Other | Attending: Emergency Medicine | Admitting: Emergency Medicine

## 2019-09-18 DIAGNOSIS — N4889 Other specified disorders of penis: Secondary | ICD-10-CM | POA: Diagnosis not present

## 2019-09-18 DIAGNOSIS — N489 Disorder of penis, unspecified: Secondary | ICD-10-CM

## 2019-09-18 LAB — POCT URINALYSIS DIP (DEVICE)
Bilirubin Urine: NEGATIVE
Glucose, UA: >=1000 mg/dL — AB
Hgb urine dipstick: NEGATIVE
Ketones, ur: 15 mg/dL — AB
Leukocytes,Ua: NEGATIVE
Nitrite: NEGATIVE
Protein, ur: NEGATIVE mg/dL
Specific Gravity, Urine: 1.01 (ref 1.005–1.030)
Urobilinogen, UA: 0.2 mg/dL (ref 0.0–1.0)
pH: 7 (ref 5.0–8.0)

## 2019-09-18 MED ORDER — CLOTRIMAZOLE 1 % EX CREA: TOPICAL_CREAM | CUTANEOUS | 0 refills | Status: DC

## 2019-09-18 MED ORDER — DOXYCYCLINE HYCLATE 100 MG PO CAPS: 100.0000 mg | ORAL_CAPSULE | Freq: Two times a day (BID) | ORAL | 0 refills | Status: AC

## 2019-09-18 NOTE — ED Provider Notes (Signed)
[employer]    CSN: [id number] Arrival date & time: 09/18/19  1316      History   Chief Complaint Chief Complaint  Patient presents with  . Penis Pain    HPI [name] [name]. is a 19 y.o. male history of DM type I recent DKA admission, asthma, epilepsy, among other medical history listed below presenting today for evaluation of penile pain and irritation.  Patient notes over the past 4 days he has noticed redness swelling and discomfort to the tip of his penis.  He also reports dysuria and increased pain with urination.  Does report some penile discharge as well.  He has noticed some bumps developing to his penis as well as one on his scrotum.  He has tried changing soaps but this is continued.  Has associated itching with lesions as well.  He reports being in a monogamous relationship with 1 partner.  His partner recently was screened for STDs and was negative.  HPI  Past Medical History:  Diagnosis Date  . ADD (attention deficit disorder)   . Allergy   . Anxiety   . Asthma   . Depression   . Epileptic seizure ([place])    "both petit and grand mal; last sz ~ 2 wk ago" (06/17/2013)  . Headache(784.0)    "2-3 times/wk usually" (06/17/2013)  . Heart murmur    "heard a slight one earlier today" (06/17/2013)  . Homeless   . Migraine    "maybe once/month; it's severe" (06/17/2013)  . ODD (oppositional defiant disorder)   . Sickle cell trait ([place])   . Type I diabetes mellitus ([place])    "that's what they're thinking now" (06/17/2013)  . Vision abnormalities    "takes him longer to focus cause; from his sz" (06/17/2013)    Patient Active Problem List   Diagnosis Date Noted  . Anxiety 07/10/2019  . Hyponatremia 07/10/2019  . 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 AKI (acute kidney injury) ([place]) 09/15/2018  . URI (upper respiratory infection) 09/15/2018  . Adjustment disorder with mixed disturbance of emotions and conduct   . Atypical chest pain  08/19/2018  . Borderline personality disorder ([place]) 02/26/2018  . 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 Major depressive disorder, recurrent severe without psychotic features ([place]) 07/24/2017  . DKA, type 1 ([place]) 07/16/2017  . Nausea & vomiting 07/16/2017  . Uncontrolled type 1 diabetes circulatory disorder erectile dysfunction ([place])   . DKA (diabetic ketoacidoses) ([place]) 12/18/2016  . History of seizures 12/01/2016  . History of migraine 12/01/2016  . Disordered eating 03/08/2015  . Ketonuria   . Adjustment reaction to medical therapy   . Non compliance w medication regimen   . Diabetic peripheral neuropathy associated with type 1 diabetes mellitus ([place]) 09/29/2014  . Dehydration 08/22/2014  . Hyperglycemia due to type 1 diabetes mellitus ([place]) 08/21/2014  . Type 1 diabetes mellitus with hyperglycemia ([place])   . Noncompliance   . Generalized abdominal pain   . Glycosuria   . Depression 07/12/2014  . Involuntary movements 06/13/2014  . Bilateral leg pain 06/13/2014  . 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 Suicidal ideations 03/31/2014  . ODD (oppositional defiant disorder) 03/03/2014  . MDD (major depressive disorder), recurrent episode, moderate ([place]) 02/16/2014  . Attention deficit hyperactivity disorder (ADHD), combined type, moderate 02/16/2014  . Microcytic anemia 02/15/2014  . Hyperglycemia 02/14/2014  . Goiter 02/04/2014  . Peripheral autonomic neuropathy due  to diabetes mellitus ([place]) 02/04/2014  . Acquired acanthosis nigricans 02/04/2014  . Obesity, morbid ([place]) 02/04/2014  . Insulin resistance 02/04/2014  . Hyperinsulinemia 02/04/2014  . Hypoglycemia associated with diabetes ([place]) 02/02/2014  . Maladaptive health behaviors affecting medical condition 02/02/2014  . Hypoglycemia 02/02/2014  . Type 1 diabetes mellitus in patient 77 to 21 years of age with  hemoglobin A1c goal of less than 7.5% ([place]) 01/26/2014  . Partial epilepsy with impairment of consciousness ([place]) 12/13/2013  . 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 Migraine without aura 12/13/2013  . Body mass index, pediatric, greater than or equal to 95th percentile for age 90/16/2015  . Asthma 12/13/2013  . Hypoglycemia unawareness in type 1 diabetes mellitus ([place]) 08/08/2013  . Epilepsy ([place]) 07/06/2013  . Short-term memory loss 07/06/2013  . Sickle cell trait ([place]) 07/06/2013  . Diabetes ([place]) 06/17/2013  . Diabetes mellitus, new onset ([place]) 06/17/2013    Past Surgical History:  Procedure Laterality Date  . CIRCUMCISION  2000  . FINGER SURGERY Left 2001   "crushed pinky; had to repair it" (06/17/2013)       Home Medications    Prior to Admission medications   Medication Sig Start Date End Date Taking? Authorizing Provider  albuterol (PROVENTIL HFA;VENTOLIN HFA) 108 (90 Base) MCG/ACT inhaler Inhale 1 puff into the lungs every 4 (four) hours as needed for wheezing or shortness of breath. 09/16/18  Yes Ghimire, Henreitta Leber, MD  blood glucose meter kit and supplies KIT Dispense based on patient and insurance preference. Use up to four times daily as directed. (FOR ICD-9 250.00, 250.01). 04/22/19  Yes Robinson, [place] N, PA-C  blood glucose meter kit and supplies Dispense based on patient and insurance preference. Use up to four times daily as directed. (FOR ICD-10 E10.9, E11.9). 09/16/18  Yes Ghimire, Henreitta Leber, MD  FLUoxetine (PROZAC) 20 MG capsule Take 1 capsule (20 mg total) by mouth daily. 09/20/18  Yes Patrecia Pour, NP  glucose blood (FREESTYLE TEST STRIPS) test strip Use as instructed 09/16/18  Yes Ghimire, Henreitta Leber, MD  hydrOXYzine (ATARAX/VISTARIL) 25 MG tablet Take 25 mg by mouth every 6 (six) hours as needed for anxiety.  06/27/19  Yes [provider]  insulin aspart (NOVOLOG FLEXPEN) 100 UNIT/ML FlexPen Inject 1-15 Units into the skin 3 (three)  times daily with meals. 1 unit/g of carbohydrate sliding scale. 07/10/19  Yes Gherghe, Vella Redhead, MD  Insulin Glargine (LANTUS) 100 UNIT/ML Solostar Pen Inject 30 Units into the skin 2 (two) times daily. 07/10/19  Yes Caren Griffins, MD  Insulin Pen Needle 32G X 8 MM MISC Use as directed 10/10/18  Yes Carmon Sails J, PA-C  LORazepam (ATIVAN) 2 MG tablet Take 2 mg by mouth every 6 (six) hours as needed for anxiety.   Yes [provider]  traZODone (DESYREL) 100 MG tablet Take 1 tablet (100 mg total) by mouth at bedtime as needed for sleep. 09/19/18  Yes Patrecia Pour, NP  clotrimazole (LOTRIMIN) 1 % cream Apply to affected area 2 times daily 09/18/19   Matthieu Loftus, [place] C, PA-C  doxycycline (VIBRAMYCIN) 100 MG capsule Take 1 capsule (100 mg total) by mouth 2 (two) times daily for 7 days. 09/18/19 09/25/19  Dotsie Gillette, Elesa Hacker, PA-C    Family History Family History  Problem Relation Age of Onset  . Asthma Mother   . COPD Mother   . Other Mother        possible autoimmune, unclear  . Diabetes Maternal  Grandmother   . Heart disease Maternal Grandmother   . Hypertension Maternal Grandmother   . Mental illness Maternal Grandmother   . Heart disease Maternal Grandfather   . Hyperlipidemia Paternal Grandmother   . Hyperlipidemia Paternal Grandfather   . Migraines Sister        Hemiplegic Migraines     Social History Social History   Tobacco Use  . Smoking status: Current Every Day Smoker    Packs/day: 1.00    Types: Cigarettes  . Smokeless tobacco: Never Used  . Tobacco comment: Mom and dad smoke outside   Substance Use Topics  . Alcohol use: No    Alcohol/week: 0.0 standard drinks  . Drug use: Yes    Types: Marijuana     Allergies   Bee venom, Fish allergy, Shellfish allergy, and Tea   Review of Systems Review of Systems  Constitutional: Negative for fever.  HENT: Negative for sore throat.   Respiratory: Negative for shortness of breath.   Cardiovascular: Negative  for chest pain.  Gastrointestinal: Negative for abdominal pain, nausea and vomiting.  Genitourinary: Positive for discharge, dysuria, genital sores and penile pain. Negative for difficulty urinating, frequency, penile swelling, scrotal swelling and testicular pain.  Skin: Negative for rash.  Neurological: Negative for dizziness, light-headedness and headaches.     Physical Exam Triage Vital Signs ED Triage Vitals  Enc Vitals Group     BP 09/18/19 1329 (!) 148/87     Pulse Rate 09/18/19 1329 (!) 106     Resp 09/18/19 1329 16     Temp 09/18/19 1329 98 F (36.7 C)     Temp src --      SpO2 09/18/19 1329 98 %     Weight 09/18/19 1326 215 lb (97.5 kg)     Height 09/18/19 1326 '5\' 11"'  (1.803 m)     Head Circumference --      Peak Flow --      Pain Score 09/18/19 1326 5     Pain Loc --      Pain Edu? --      Excl. in [place]? --    No data found.  Updated Vital Signs BP (!) 148/87 (BP Location: Left Arm)   Pulse (!) 106   Temp 98 F (36.7 C)   Resp 16   Ht '5\' 11"'  (1.803 m)   Wt 215 lb (97.5 kg)   SpO2 98%   BMI 29.99 kg/m   Visual Acuity Right Eye Distance:   Left Eye Distance:   Bilateral Distance:    Right Eye Near:   Left Eye Near:    Bilateral Near:     Physical Exam Vitals and nursing note reviewed.  Constitutional:      Appearance: He is well-developed.     Comments: No acute distress  HENT:     Head: Normocephalic and atraumatic.     Nose: Nose normal.  Eyes:     Conjunctiva/sclera: Conjunctivae normal.  Cardiovascular:     Rate and Rhythm: Normal rate.  Pulmonary:     Effort: Pulmonary effort is normal. No respiratory distress.  Abdominal:     General: There is no distension.  Genitourinary:    Comments: Circumcised, glans appears more erythematous than normal, more focal near urethra, no discharge noted on exam, discomfort to touch, shaft of penis with clustered papular lesions bilaterally, no associated erythema, vesicles or ulceration noted, mild  discomfort to touch  Right anterior aspect of scrotum with isolated erythematous bump with overlying  scabbing  Right pubic area with erythema surrounding hair follicle and tenderness  No lymphadenopathy palpated Musculoskeletal:        General: Normal range of motion.     Cervical back: Neck supple.  Skin:    General: Skin is warm and dry.  Neurological:     Mental Status: He is alert and oriented to person, place, and time.      UC Treatments / Results  Labs (all labs ordered are listed, but only abnormal results are displayed) Labs Reviewed  POCT URINALYSIS DIP (DEVICE) - Abnormal; Notable for the following components:      Result Value   Glucose, UA >=1000 (*)    Ketones, ur 15 (*)    All other components within normal limits  HSV CULTURE AND TYPING  CYTOLOGY, (ORAL, ANAL, URETHRAL) ANCILLARY ONLY    EKG   Radiology No results found.  Procedures Procedures (including critical care time)  Medications Ordered in UC Medications - No data to display  Initial Impression / Assessment and Plan / UC Course  I have reviewed the triage vital signs and the nursing notes.  Pertinent labs & imaging results that were available during my care of the patient were reviewed by me and considered in my medical decision making (see chart for details).     UA with greater than 1000 glucose, negative leuks and nitrites.  Do not suspect UTI at this time.  Likely erythema causing yeast dermatitis at tip of penis/balanitis.  Bumps on shaft are not classic HSV, deferring treatment at this time, HSV swab obtained.  Advised to continue to monitor and follow-up if not resolving or changing in character.  Providing clotrimazole to apply topically to glans and over these bumps as well.  Lesion to scrotum and pubic area concerning for folliculitis, initiating on doxycycline twice daily x1 week.  Warm compresses.  Urethral swab obtained to screen for STDs.  Discussed strict return precautions.  Patient verbalized understanding and is agreeable with plan.  Final Clinical Impressions(s) / UC Diagnoses   Final diagnoses:  Penile pain  Penile lesion     Discharge Instructions     Urine with a lot of glucose Penile swab pending to check for gonorrhea, chlamydia and trichomonas I believe redness and irritation to tip of penis from yeast infection- apply clotrimazole to this area twice daily for the next 1-2 weeks Begin doxycycline twice daily for 1 week to treat folliculitis bump on scrotum and pubic area, warm compresses Lesions do not appear like classic herpes, swab pending, please continue to monitor for these to resolve, follow up if pain worsening, becoming more red, ulcerative    ED Prescriptions    Medication Sig Dispense Auth. Provider   clotrimazole (LOTRIMIN) 1 % cream Apply to affected area 2 times daily 15 g Sharron Simpson C, PA-C   doxycycline (VIBRAMYCIN) 100 MG capsule Take 1 capsule (100 mg total) by mouth 2 (two) times daily for 7 days. 14 capsule Eilam Shrewsbury, [place] C, PA-C     PDMP not reviewed this encounter.   Janith Lima, [place] 09/18/19 1436

## 2019-09-18 NOTE — Discharge Instructions (Signed)
Urine with a lot of glucose Penile swab pending to check for gonorrhea, chlamydia and trichomonas I believe redness and irritation to tip of penis from yeast infection- apply clotrimazole to this area twice daily for the next 1-2 weeks Begin doxycycline twice daily for 1 week to treat folliculitis bump on scrotum and pubic area, warm compresses Lesions do not appear like classic herpes, swab pending, please continue to monitor for these to resolve, follow up if pain worsening, becoming more red, ulcerative

## 2019-09-18 NOTE — ED Triage Notes (Signed)
Patient complains of tip of his penis swelling, pressure and painful to urinate. Patient states that this started 4 days ago. Patient states that he has noticed bumps on his penis as well as a bump on his testicle.

## 2019-09-20 LAB — CYTOLOGY, (ORAL, ANAL, URETHRAL) ANCILLARY ONLY
Chlamydia: NEGATIVE
Neisseria Gonorrhea: NEGATIVE
Trichomonas: NEGATIVE

## 2019-09-20 LAB — HSV CULTURE AND TYPING

## 2019-09-21 ENCOUNTER — Telehealth (HOSPITAL_COMMUNITY): Payer: Self-pay | Admitting: Emergency Medicine

## 2019-09-21 MED ORDER — VALACYCLOVIR HCL 1 G PO TABS: 1000.0000 mg | ORAL_TABLET | Freq: Three times a day (TID) | ORAL | 0 refills | Status: DC

## 2019-09-21 NOTE — Telephone Encounter (Signed)
HSV swab positive. Attempted to call x 1 to inform results, voicemail left to return call. Valtrex sent to [facility] [address]. Will attempt call again if patient does not return call.

## 2019-09-28 ENCOUNTER — Encounter (HOSPITAL_COMMUNITY): Payer: Self-pay | Admitting: Psychiatry

## 2019-09-28 ENCOUNTER — Observation Stay (HOSPITAL_COMMUNITY)
Admission: AD | Admit: 2019-09-28 | Discharge: 2019-09-28 | Discharge status: Against Medical Advice | Payer: Medicaid Other | Attending: Psychiatry | Admitting: Psychiatry

## 2019-09-28 DIAGNOSIS — R45851 Suicidal ideations: Principal | ICD-10-CM | POA: Insufficient documentation

## 2019-09-28 DIAGNOSIS — F329 Major depressive disorder, single episode, unspecified: Secondary | ICD-10-CM | POA: Diagnosis present

## 2019-09-28 MED ORDER — ACETAMINOPHEN 325 MG PO TABS
650.0000 mg | ORAL_TABLET | Freq: Four times a day (QID) | ORAL | Status: DC | PRN
  Filled 2019-09-28: qty 2

## 2019-09-28 MED ORDER — INSULIN ASPART 100 UNIT/ML ~~LOC~~ SOLN
0.0000 [IU] | Freq: Every day | SUBCUTANEOUS | Status: DC
  Filled 2019-09-28 (×8): qty 0.05

## 2019-09-28 MED ORDER — ALUM & MAG HYDROXIDE-SIMETH 200-200-20 MG/5ML PO SUSP
30.0000 mL | ORAL | Status: DC | PRN
  Filled 2019-09-28: qty 30

## 2019-09-28 MED ORDER — HYDROXYZINE HCL 25 MG PO TABS
25.0000 mg | ORAL_TABLET | Freq: Three times a day (TID) | ORAL | Status: DC | PRN
  Filled 2019-09-28 (×2): qty 1

## 2019-09-28 MED ORDER — MAGNESIUM HYDROXIDE 400 MG/5ML PO SUSP
30.0000 mL | Freq: Every day | ORAL | Status: DC | PRN
  Filled 2019-09-28: qty 30

## 2019-09-28 MED ORDER — INSULIN ASPART 100 UNIT/ML ~~LOC~~ SOLN
0.0000 [IU] | Freq: Three times a day (TID) | SUBCUTANEOUS | Status: DC
  Filled 2019-09-28 (×22): qty 0.09

## 2019-09-28 MED ORDER — TRAZODONE HCL 50 MG PO TABS
50.0000 mg | ORAL_TABLET | Freq: Every evening | ORAL | Status: DC | PRN
  Filled 2019-09-28: qty 1

## 2019-09-28 NOTE — Progress Notes (Addendum)
Pt refused covid 19 swab, refused vistaril for anxiety, did not want to cooperate with admission process. Pt states, "this was a mistake coming in here. I'm not suicidal. I need to be out there finding a car to go to work for my child." 500 unit RN was present to perform the covid swab that pt refused, and he witnessed pt's denial of suicidal ideation as did this Clinical research associate. NP T. Lewis was notified of pt's request to leave AMA, and instructed this writer to go ahead and offer pt the AMA form to pt which pt signed. All personal belongings were returned to pt upon discharge. Pt also denies any homicidal ideation, denies hallucinations upon discharge.
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
## 2019-10-05 ENCOUNTER — Other Ambulatory Visit: Payer: Self-pay

## 2019-10-05 ENCOUNTER — Observation Stay (HOSPITAL_COMMUNITY): Admission: AD | Admit: 2019-10-05 | Payer: Medicaid Other | Source: Home / Self Care | Admitting: Psychiatry

## 2019-10-05 ENCOUNTER — Encounter (HOSPITAL_COMMUNITY): Payer: Self-pay | Admitting: Psychiatric/Mental Health

## 2019-10-05 ENCOUNTER — Inpatient Hospital Stay (HOSPITAL_COMMUNITY)
Admission: AD | Admit: 2019-10-05 | Discharge: 2019-10-06 | DRG: 885 | Disposition: A | Discharge status: Home / Self Care | Payer: Medicaid Other | Attending: Psychiatry | Admitting: Psychiatry

## 2019-10-05 DIAGNOSIS — F314 Bipolar disorder, current episode depressed, severe, without psychotic features: Principal | ICD-10-CM | POA: Diagnosis present

## 2019-10-05 DIAGNOSIS — Z91030 Bee allergy status: Secondary | ICD-10-CM

## 2019-10-05 DIAGNOSIS — Z20822 Contact with and (suspected) exposure to covid-19: Secondary | ICD-10-CM | POA: Diagnosis present

## 2019-10-05 DIAGNOSIS — Z91128 Patient's intentional underdosing of medication regimen for other reason: Secondary | ICD-10-CM

## 2019-10-05 DIAGNOSIS — Z72811 Adult antisocial behavior: Secondary | ICD-10-CM

## 2019-10-05 DIAGNOSIS — T4396XA Underdosing of unspecified psychotropic drug, initial encounter: Secondary | ICD-10-CM | POA: Diagnosis present

## 2019-10-05 DIAGNOSIS — Z833 Family history of diabetes mellitus: Secondary | ICD-10-CM

## 2019-10-05 DIAGNOSIS — F900 Attention-deficit hyperactivity disorder, predominantly inattentive type: Secondary | ICD-10-CM | POA: Diagnosis present

## 2019-10-05 DIAGNOSIS — Z91018 Allergy to other foods: Secondary | ICD-10-CM

## 2019-10-05 DIAGNOSIS — Z87892 Personal history of anaphylaxis: Secondary | ICD-10-CM

## 2019-10-05 DIAGNOSIS — F319 Bipolar disorder, unspecified: Secondary | ICD-10-CM | POA: Diagnosis present

## 2019-10-05 DIAGNOSIS — Z560 Unemployment, unspecified: Secondary | ICD-10-CM

## 2019-10-05 DIAGNOSIS — Z915 Personal history of self-harm: Secondary | ICD-10-CM

## 2019-10-05 DIAGNOSIS — Z9114 Patient's other noncompliance with medication regimen: Secondary | ICD-10-CM

## 2019-10-05 DIAGNOSIS — D573 Sickle-cell trait: Secondary | ICD-10-CM | POA: Diagnosis present

## 2019-10-05 DIAGNOSIS — R45851 Suicidal ideations: Secondary | ICD-10-CM

## 2019-10-05 DIAGNOSIS — F603 Borderline personality disorder: Secondary | ICD-10-CM | POA: Diagnosis present

## 2019-10-05 DIAGNOSIS — Y92009 Unspecified place in unspecified non-institutional (private) residence as the place of occurrence of the external cause: Secondary | ICD-10-CM

## 2019-10-05 DIAGNOSIS — E109 Type 1 diabetes mellitus without complications: Secondary | ICD-10-CM | POA: Diagnosis present

## 2019-10-05 DIAGNOSIS — Z9119 Patient's noncompliance with other medical treatment and regimen: Secondary | ICD-10-CM

## 2019-10-05 DIAGNOSIS — Z818 Family history of other mental and behavioral disorders: Secondary | ICD-10-CM

## 2019-10-05 DIAGNOSIS — Z91013 Allergy to seafood: Secondary | ICD-10-CM

## 2019-10-05 MED ORDER — ALUM & MAG HYDROXIDE-SIMETH 200-200-20 MG/5ML PO SUSP: 30.0000 mL | ORAL | Status: DC | PRN

## 2019-10-05 MED ORDER — MAGNESIUM HYDROXIDE 400 MG/5ML PO SUSP: 30.0000 mL | Freq: Every day | ORAL | Status: DC | PRN

## 2019-10-05 MED ORDER — ACETAMINOPHEN 325 MG PO TABS: 650.0000 mg | ORAL_TABLET | Freq: Four times a day (QID) | ORAL | Status: DC | PRN

## 2019-10-05 MED ORDER — LORAZEPAM 1 MG PO TABS
1.0000 mg | ORAL_TABLET | Freq: Once | ORAL | Status: AC | PRN
  Administered 2019-10-05: 1 mg via ORAL
  Filled 2019-10-05: qty 2

## 2019-10-05 MED ORDER — TRAZODONE HCL 50 MG PO TABS
50.0000 mg | ORAL_TABLET | Freq: Every evening | ORAL | Status: DC | PRN
  Administered 2019-10-05: 50 mg via ORAL
  Filled 2019-10-05: qty 1

## 2019-10-05 NOTE — Plan of Care (Signed)
[facility] Observation Crisis Plan  Reason for Crisis Plan:  Crisis Stabilization   Plan of Care:  Referral for Inpatient Hospitalization  Family Support:      Current Living Environment:     Insurance:   Hospital Account    Name Acct ID Class Status Primary Coverage   [name] [name], [name] [name] [id number] BEHAVIORAL HEALTH OBSERVATION [place]        Guarantor Account (for Hospital Account [id number])    Name Relation to Pt Service Area Active? Acct Type   [name] [name]. Self [facility] Yes Behavioral Health   Address Phone       [address] [place], [place] [address] [phone](H)          Coverage Information (for Hospital Account [id number])    F/O Payor/Plan Precert #   [facility] [place]    Subscriber Subscriber #   [name] [name], [name] [name] [id number]   Address Phone   [address] [place], [place] [address] [phone]      Legal Guardian:     Primary Care Provider:  Patient, No Pcp Per  Current Outpatient Providers:  none  Psychiatrist:     Counselor/Therapist:     Compliant with Medications:  No  Additional Information:   [name] [name] 4/7/202111:36 PM
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
## 2019-10-06 DIAGNOSIS — R45851 Suicidal ideations: Secondary | ICD-10-CM

## 2019-10-06 DIAGNOSIS — Z91128 Patient's intentional underdosing of medication regimen for other reason: Secondary | ICD-10-CM | POA: Diagnosis not present

## 2019-10-06 DIAGNOSIS — Z560 Unemployment, unspecified: Secondary | ICD-10-CM | POA: Diagnosis not present

## 2019-10-06 DIAGNOSIS — Z9119 Patient's noncompliance with other medical treatment and regimen: Secondary | ICD-10-CM | POA: Diagnosis not present

## 2019-10-06 DIAGNOSIS — F900 Attention-deficit hyperactivity disorder, predominantly inattentive type: Secondary | ICD-10-CM | POA: Diagnosis present

## 2019-10-06 DIAGNOSIS — D573 Sickle-cell trait: Secondary | ICD-10-CM | POA: Diagnosis present

## 2019-10-06 DIAGNOSIS — Z20822 Contact with and (suspected) exposure to covid-19: Secondary | ICD-10-CM | POA: Diagnosis present

## 2019-10-06 DIAGNOSIS — Z915 Personal history of self-harm: Secondary | ICD-10-CM | POA: Diagnosis not present

## 2019-10-06 DIAGNOSIS — Z91030 Bee allergy status: Secondary | ICD-10-CM | POA: Diagnosis not present

## 2019-10-06 DIAGNOSIS — Z9114 Patient's other noncompliance with medication regimen: Secondary | ICD-10-CM | POA: Diagnosis not present

## 2019-10-06 DIAGNOSIS — Z72811 Adult antisocial behavior: Secondary | ICD-10-CM | POA: Diagnosis not present

## 2019-10-06 DIAGNOSIS — F314 Bipolar disorder, current episode depressed, severe, without psychotic features: Principal | ICD-10-CM

## 2019-10-06 DIAGNOSIS — Z87892 Personal history of anaphylaxis: Secondary | ICD-10-CM | POA: Diagnosis not present

## 2019-10-06 DIAGNOSIS — Z833 Family history of diabetes mellitus: Secondary | ICD-10-CM | POA: Diagnosis not present

## 2019-10-06 DIAGNOSIS — F319 Bipolar disorder, unspecified: Secondary | ICD-10-CM | POA: Diagnosis present

## 2019-10-06 DIAGNOSIS — Z91018 Allergy to other foods: Secondary | ICD-10-CM | POA: Diagnosis not present

## 2019-10-06 DIAGNOSIS — F603 Borderline personality disorder: Secondary | ICD-10-CM | POA: Diagnosis present

## 2019-10-06 DIAGNOSIS — Y92009 Unspecified place in unspecified non-institutional (private) residence as the place of occurrence of the external cause: Secondary | ICD-10-CM | POA: Diagnosis not present

## 2019-10-06 DIAGNOSIS — Z91013 Allergy to seafood: Secondary | ICD-10-CM | POA: Diagnosis not present

## 2019-10-06 DIAGNOSIS — E109 Type 1 diabetes mellitus without complications: Secondary | ICD-10-CM | POA: Diagnosis present

## 2019-10-06 DIAGNOSIS — Z818 Family history of other mental and behavioral disorders: Secondary | ICD-10-CM | POA: Diagnosis not present

## 2019-10-06 DIAGNOSIS — T4396XA Underdosing of unspecified psychotropic drug, initial encounter: Secondary | ICD-10-CM | POA: Diagnosis present

## 2019-10-06 HISTORY — DX: Suicidal ideations: R45.851

## 2019-10-06 LAB — GLUCOSE, CAPILLARY
Glucose-Capillary: 284 mg/dL — ABNORMAL HIGH (ref 70–99)
Glucose-Capillary: 497 mg/dL — ABNORMAL HIGH (ref 70–99)
Glucose-Capillary: 573 mg/dL (ref 70–99)
Glucose-Capillary: 568 mg/dL (ref 70–99)

## 2019-10-06 LAB — URINALYSIS, COMPLETE (UACMP) WITH MICROSCOPIC
Bacteria, UA: NONE SEEN
Bilirubin Urine: NEGATIVE
Glucose, UA: >=500 mg/dL — AB
Hgb urine dipstick: NEGATIVE
Ketones, ur: 20 mg/dL — AB
Leukocytes,Ua: NEGATIVE
Nitrite: NEGATIVE
Protein, ur: NEGATIVE mg/dL
Specific Gravity, Urine: 1.022 (ref 1.005–1.030)
pH: 6 (ref 5.0–8.0)

## 2019-10-06 LAB — RAPID URINE DRUG SCREEN, HOSP PERFORMED
Amphetamines: NOT DETECTED
Barbiturates: NOT DETECTED
Benzodiazepines: NOT DETECTED
Cocaine: NOT DETECTED
Opiates: NOT DETECTED
Tetrahydrocannabinol: NOT DETECTED

## 2019-10-06 LAB — RESPIRATORY PANEL BY RT PCR (FLU A&B, COVID)
Influenza A by PCR: NEGATIVE
Influenza B by PCR: NEGATIVE
SARS Coronavirus 2 by RT PCR: NEGATIVE

## 2019-10-06 MED ORDER — INSULIN GLARGINE 100 UNIT/ML ~~LOC~~ SOLN: 30.0000 [IU] | Freq: Two times a day (BID) | SUBCUTANEOUS | Status: DC

## 2019-10-06 MED ORDER — INSULIN ASPART 100 UNIT/ML ~~LOC~~ SOLN
10.0000 [IU] | Freq: Once | SUBCUTANEOUS | Status: AC
  Administered 2019-10-06: 10 [IU] via SUBCUTANEOUS

## 2019-10-06 MED ORDER — INSULIN ASPART 100 UNIT/ML ~~LOC~~ SOLN: 0.0000 [IU] | Freq: Every day | SUBCUTANEOUS | Status: DC

## 2019-10-06 MED ORDER — ZIPRASIDONE MESYLATE 20 MG IM SOLR: 20.0000 mg | Freq: Four times a day (QID) | INTRAMUSCULAR | Status: DC | PRN

## 2019-10-06 MED ORDER — INSULIN ASPART 100 UNIT/ML ~~LOC~~ SOLN: 0.0000 [IU] | Freq: Three times a day (TID) | SUBCUTANEOUS | Status: DC

## 2019-10-06 MED ORDER — BUPROPION HCL ER (SR) 150 MG PO TB12
150.0000 mg | ORAL_TABLET | Freq: Every day | ORAL | Status: DC
  Administered 2019-10-06: 150 mg via ORAL
  Filled 2019-10-06 (×4): qty 1

## 2019-10-06 MED ORDER — TRAZODONE HCL 100 MG PO TABS
100.0000 mg | ORAL_TABLET | Freq: Every day | ORAL | Status: DC
  Filled 2019-10-06: qty 1

## 2019-10-06 MED ORDER — INSULIN GLARGINE 100 UNIT/ML ~~LOC~~ SOLN: 20.0000 [IU] | Freq: Two times a day (BID) | SUBCUTANEOUS | Status: DC

## 2019-10-06 MED ORDER — QUETIAPINE FUMARATE 50 MG PO TABS
50.0000 mg | ORAL_TABLET | Freq: Three times a day (TID) | ORAL | Status: DC
  Administered 2019-10-06: 50 mg via ORAL
  Filled 2019-10-06 (×3): qty 1

## 2019-10-06 MED ORDER — INSULIN ASPART 100 UNIT/ML ~~LOC~~ SOLN: 6.0000 [IU] | Freq: Three times a day (TID) | SUBCUTANEOUS | Status: DC

## 2019-10-06 MED ORDER — INSULIN GLARGINE 100 UNIT/ML ~~LOC~~ SOLN
30.0000 [IU] | Freq: Two times a day (BID) | SUBCUTANEOUS | Status: DC
  Administered 2019-10-06: 09:00:00 30 [IU] via SUBCUTANEOUS

## 2019-10-06 MED ORDER — LORAZEPAM 2 MG/ML IJ SOLN: 4.0000 mg | Freq: Once | INTRAMUSCULAR | Status: DC

## 2019-10-06 NOTE — BHH Suicide Risk Assessment (Signed)
[facility] Discharge Suicide Risk Assessment   Principal Problem: <principal problem not specified> Discharge Diagnoses: Active Problems:   Bipolar 1 disorder, depressed, severe (HCC)   Suicidal ideation   Bipolar disorder (HCC)   Total Time spent with patient: 45 minutes Musculoskeletal: Strength & Muscle Tone: within normal limits Gait & Station: normal Patient leans: N/A  Psychiatric Specialty Exam: Physical Exam  Review of Systems  Blood pressure 122/79, pulse 93, temperature 98.7 F (37.1 C), temperature source Oral, resp. rate 18, height 5\' 11"  (1.803 m), weight 104.3 kg, SpO2 98 %.Body mass index is 32.08 kg/m.  General Appearance: Casual  Eye Contact:  Good  Speech:  Clear and Coherent  Volume:  Normal  Mood:  Irritable  Affect:  Constricted  Thought Process:  Coherent and Goal Directed  Orientation:  Full (Time, Place, and Person)  Thought Content:  Denies any psychotic symptoms  Suicidal Thoughts:  No  Homicidal Thoughts:  No  Memory:  Immediate;   Fair Recent;   Fair Remote;   Good  Judgement:  Poor  Insight:  Shallow  Psychomotor Activity:  Normal  Concentration:  Concentration: Fair and Attention Span: Fair  Recall:  of Knowledge:  Fair  Language:  Fair  Akathisia:  Negative  Handed:  Right  AIMS (if indicated):     Assets:  Physical Health Resilience  ADL's:  Intact  Cognition:  WNL  Sleep:      Mental Status Per Nursing Assessment::   On Admission:  Suicidal ideation indicated by patient, Suicide plan  Demographic Factors:  Male and Unemployed  Loss Factors: Legal issues  Historical Factors: Prior suicide attempts and Impulsivity  Risk Reduction Factors:   Positive social support and Positive therapeutic relationship  Continued Clinical Symptoms:  More than one psychiatric diagnosis Previous Psychiatric Diagnoses and Treatments  Cognitive Features That Contribute To Risk:  Polarized thinking    Suicide Risk:  Moderate:   Frequent suicidal ideation with limited intensity, and duration, some specificity in terms of plans, no associated intent, good self-control, limited dysphoria/symptomatology, some risk factors present, and identifiable protective factors, including available and accessible social support.    Plan Of Care/Follow-up recommendations:  Activity:  d/c  [phone], MD 10/06/2019, 2:56 PM

## 2019-10-06 NOTE — Progress Notes (Signed)
  [facility] Adult Case Management Discharge Plan :  Will you be returning to the same living situation after discharge:  Yes,  patient is returning home with fiance At discharge, do you have transportation home?: Yes,  bus passes Do you have the ability to pay for your medications: Yes,  Medicaid  Release of information consent forms completed and in the chart;  Patient's signature needed at discharge.  Patient to Follow up at: Follow-up Information    [facility] Call.   Why: ACTT services will continue at discharge. Please contact agency for any further questions or concerns.  Contact information: [address] [place] [address] [phone]           Next level of care provider has access to [facility] Link:yes  Safety Planning and Suicide Prevention discussed: Yes,  with the patient     Has patient been referred to the Quitline?: Patient refused referral  Patient has been referred for addiction treatment: Pt. refused referral  Maeola Sarah, LCSWA 10/06/2019, 3:17 PM
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
## 2019-10-06 NOTE — Progress Notes (Signed)
Inpatient Diabetes Program Recommendations  AACE/ADA: New Consensus Statement on Inpatient Glycemic Control (2015)  Target Ranges:  Prepandial:   less than 140 mg/dL      Peak postprandial:   less than 180 mg/dL (1-2 hours)      Critically ill patients:  140 - 180 mg/dL   Lab Results  Component Value Date   GLUCAP 568 (HH) 10/06/2019   HGBA1C 11.5 (H) 08/09/2018    Review of Glycemic Control  Blood sugars today: 284, 497, 573, 568 mg/dL.  Received Lantus 30 units @ 0841. Received Novolog 10 units @ 1215. Received Novolog 10 units @ 1320.  Concerned about severely elevated blood sugars in pt with Type 1 DM.  Inpatient Diabetes Program Recommendations:     Consult with [facility] for glucose management. Needs BMET. Not ordered since presenting to ED.  Continue to follow. Sent secure text to Dr Jola Babinski.  Thank you. Ailene Ards, RD, LDN, CDE Inpatient Diabetes Coordinator [phone]

## 2019-10-06 NOTE — Progress Notes (Signed)
D:  Patient presents easily agitated, limited communication with staff. He reports, "Everytime you talk to someone just to vent, things get interpreted wrong so I'll just keep my fucking mouth closed. Patient denies any physical complaints when asked. He denies any SI, HI, AVH when asked. Plans to be moved to inpatient unit when one becomes available. Received Lantus for BS 284 to left arm. Tolerated well.    A: Support and encouragement provided. Routine safety checks conducted every 15 minutes per unit protocol. Encouraged to notify if thoughts of harm toward self or others arise. He agrees.   R: Patient remains safe at this time, patient verbally contracts for safety at this time. In room resting with eyes closed. Will continue to monitor.    [place] NOVEL CORONAVIRUS (COVID-19) DAILY CHECK-OFF SYMPTOMS - answer yes or no to each - every day NO YES  Have you had a fever in the past 24 hours?  . Fever (Temp > 37.80C / 100F) X   Have you had any of these symptoms in the past 24 hours? . New Cough .  Sore Throat  .  Shortness of Breath .  Difficulty Breathing .  Unexplained Body Aches   X   Have you had any one of these symptoms in the past 24 hours not related to allergies?   . Runny Nose .  Nasal Congestion .  Sneezing   X   If you have had runny nose, nasal congestion, sneezing in the past 24 hours, has it worsened?  X   EXPOSURES - check yes or no X   Have you traveled outside the state in the past 14 days?  X   Have you been in contact with someone with a confirmed diagnosis of COVID-19 or PUI in the past 14 days without wearing appropriate PPE?  X   Have you been living in the same home as a person with confirmed diagnosis of COVID-19 or a PUI (household contact)?    X   Have you been diagnosed with COVID-19?    X              What to do next: Answered NO to all: Answered YES to anything:   Proceed with unit schedule Follow the [facility] Inpatient Flowsheet.

## 2019-10-06 NOTE — BHH Suicide Risk Assessment (Signed)
[facility] Admission Suicide Risk Assessment   Nursing information obtained from:  Patient, Review of record Demographic factors:  Male, Adolescent or young adult Current Mental Status:  Suicidal ideation indicated by patient, Suicide plan Loss Factors:  Financial problems / change in socioeconomic status Historical Factors:  Prior suicide attempts, Impulsivity Risk Reduction Factors:  Positive social support, Responsible for children under 107 years of age, Employed, Positive therapeutic relationship, Sense of responsibility to family, Living with another person, especially a relative, Positive coping skills or problem solving skills  Total Time spent with patient: 45 minutes Principal Problem: <principal problem not specified> Diagnosis:  Active Problems:   Bipolar 1 disorder, depressed, severe (HCC)  Subjective Data: see eval- refuses to cooperate  Continued Clinical Symptoms:  Alcohol Use Disorder Identification Test Final Score (AUDIT): 0 The "Alcohol Use Disorders Identification Test", Guidelines for Use in Primary Care, Second Edition.  World Science writer [facility]). Score between 0-7:  no or low risk or alcohol related problems. Score between 8-15:  moderate risk of alcohol related problems. Score between 16-19:  high risk of alcohol related problems. Score 20 or above:  warrants further diagnostic evaluation for alcohol dependence and treatment.   CLINICAL FACTORS:   Personality Disorders:   Cluster B  Musculoskeletal: Strength & Muscle Tone: within normal limits Gait & Station: normal Patient leans: N/A  Psychiatric Specialty Exam: Physical Exam  Nursing note and vitals reviewed. Constitutional: He appears well-developed and well-nourished.    Review of Systems  Eyes: Negative.   Respiratory: Negative.   Cardiovascular: Negative.   Endocrine: Negative.   Neurological: Negative.   + seizures versus pseudoseizures/positive for diabetes type 1  Blood pressure 122/79,  pulse 93, temperature 98.7 F (37.1 C), temperature source Oral, resp. rate 18, SpO2 98 %.There is no height or weight on file to calculate BMI.  General Appearance: Casual  Eye Contact:  None  Speech:  mute- volunatry  Volume:  mute  Mood:  Dysphoric  Affect:  Restricted  Thought Process:  Descriptions of Associations: Intact  Orientation:  Other:  Will not answer  Thought Content:  Would not answer  Suicidal Thoughts:  will Not answer but notes indicate suicidality  Homicidal Thoughts:  not cooperating  Memory:  NA  Judgement:  Poor  Insight:  Shallow  Psychomotor Activity:  Normal  Concentration:  Concentration: Poor and Attention Span: Poor  Recall:  Poor  Fund of Knowledge:  Poor  Language:  Poor  Akathisia:  Negative  Handed:  Right  AIMS (if indicated):     Assets:  Resilience Social Support  ADL's:  Intact  Cognition:  WNL  Sleep:          COGNITIVE FEATURES THAT CONTRIBUTE TO RISK:  Polarized thinking    SUICIDE RISK:   Severe:  Frequent, intense, and enduring suicidal ideation, specific plan, no subjective intent, but some objective markers of intent (i.e., choice of lethal method), the method is accessible, some limited preparatory behavior, evidence of impaired self-control, severe dysphoria/symptomatology, multiple risk factors present, and few if any protective factors, particularly a lack of social support.  PLAN OF CARE: see eval  I certify that inpatient services furnished can reasonably be expected to improve the patient's condition.   Malvin Johns, MD 10/06/2019, 9:54 AM

## 2019-10-06 NOTE — BH Assessment (Signed)
[facility]HH Assessment Progress Note  Per Malvin Johns, MD, this voluntary pt requires psychiatric hospitalization at this time.  Jasmine has assigned pt to [facility] Rm 307-1.  Pt's nurse has been notified.  Doylene Canning, [place] [facility]al [facility] Coordinator [phone]

## 2019-10-06 NOTE — Plan of Care (Signed)
Patient refuses to cooperate with the interview,, making 3 attempts, he makes no eye contact, is volitionally mute.  By 7:15 - received a phone call that he was becoming agitated and needed medications for safety

## 2019-10-06 NOTE — BHH Counselor (Signed)
CSW unable to complete assessment due to patient discharging within 24 hours of admission. Patient was admitted to the hospital on 10/05/2019 at 10:13am. Patient discharged on 10/05/2019.   CSW was not able to meet with patient, however based on assessment notes the patient is established with [facility] services. [facility] will continue services at discharge.   Patient requested two bus passes. CSW provided resources.   There were no further questions or concerns at discharge.      Baldo Daub, MSW, LCSW Clinical Social Worker [facility]  Phone: [phone]

## 2019-10-06 NOTE — Progress Notes (Signed)
Inpatient Diabetes Program Recommendations  AACE/ADA: New Consensus Statement on Inpatient Glycemic Control (2015)  Target Ranges:  Prepandial:   less than 140 mg/dL      Peak postprandial:   less than 180 mg/dL (1-2 hours)      Critically ill patients:  140 - 180 mg/dL   Lab Results  Component Value Date   GLUCAP 284 (H) 10/06/2019   HGBA1C 11.5 (H) 08/09/2018    Review of Glycemic Control  Diabetes history: DM1 Outpatient Diabetes medications: Lantus 30 units bid, Novolog 0-15 units tidwc + 8 units tidwc Current orders for Inpatient glycemic control: Lantus 30 units bid  HgbA1C pending.  Inpatient Diabetes Program Recommendations:     Reduce Lantus to 20 units bid Add Novolog 0-15 units tidwc and hs Add Novolog 6 units tidwc if pt eats > 50% meal  Will follow and give titration recommendations daily.   Thank you. Ailene Ards, RD, LDN, CDE Inpatient Diabetes Coordinator [phone]

## 2019-10-06 NOTE — Progress Notes (Signed)
Patient's blood sugar has been running high since coming to the unit, despite insulin coverage and closed monitoring (see lab result).  However, patient is not compliant with his treatment regimen and dietary recommendation.  This afternoon patient declines to go to the emergency department for evaluation of his elevated blood sugar and demanded to leave against medical advice.  Pt discharged to lobby.  All papers and prescriptions were given and valuables returned.  Denies SI/HI and A/VH. Pt given opportunity to express concerns and ask questions.

## 2019-11-25 ENCOUNTER — Other Ambulatory Visit: Payer: Self-pay

## 2019-11-25 ENCOUNTER — Emergency Department (HOSPITAL_COMMUNITY)
Admission: EM | Admit: 2019-11-25 | Discharge: 2019-11-25 | DRG: 917 | Discharge status: Against Medical Advice | Payer: Medicaid Other | Attending: Internal Medicine | Admitting: Internal Medicine

## 2019-11-25 ENCOUNTER — Encounter (HOSPITAL_COMMUNITY): Payer: Self-pay | Admitting: Emergency Medicine

## 2019-11-25 DIAGNOSIS — D573 Sickle-cell trait: Secondary | ICD-10-CM | POA: Diagnosis present

## 2019-11-25 DIAGNOSIS — N179 Acute kidney failure, unspecified: Secondary | ICD-10-CM | POA: Diagnosis present

## 2019-11-25 DIAGNOSIS — F1520 Other stimulant dependence, uncomplicated: Secondary | ICD-10-CM

## 2019-11-25 DIAGNOSIS — E1010 Type 1 diabetes mellitus with ketoacidosis without coma: Secondary | ICD-10-CM | POA: Diagnosis not present

## 2019-11-25 DIAGNOSIS — Z20822 Contact with and (suspected) exposure to covid-19: Secondary | ICD-10-CM | POA: Diagnosis not present

## 2019-11-25 DIAGNOSIS — E1110 Type 2 diabetes mellitus with ketoacidosis without coma: Secondary | ICD-10-CM | POA: Diagnosis present

## 2019-11-25 DIAGNOSIS — F1910 Other psychoactive substance abuse, uncomplicated: Secondary | ICD-10-CM

## 2019-11-25 DIAGNOSIS — T43621A Poisoning by amphetamines, accidental (unintentional), initial encounter: Secondary | ICD-10-CM | POA: Diagnosis present

## 2019-11-25 DIAGNOSIS — F1513 Other stimulant abuse with withdrawal: Secondary | ICD-10-CM | POA: Diagnosis not present

## 2019-11-25 DIAGNOSIS — F39 Unspecified mood [affective] disorder: Secondary | ICD-10-CM | POA: Diagnosis present

## 2019-11-25 DIAGNOSIS — Z5329 Procedure and treatment not carried out because of patient's decision for other reasons: Secondary | ICD-10-CM | POA: Diagnosis present

## 2019-11-25 LAB — COMPREHENSIVE METABOLIC PANEL
ALT: 46 U/L — ABNORMAL HIGH (ref 0–44)
AST: 44 U/L — ABNORMAL HIGH (ref 15–41)
Albumin: 4.2 g/dL (ref 3.5–5.0)
Alkaline Phosphatase: 133 U/L — ABNORMAL HIGH (ref 38–126)
Anion gap: 22 — ABNORMAL HIGH (ref 5–15)
BUN: 15 mg/dL (ref 6–20)
CO2: 13 mmol/L — ABNORMAL LOW (ref 22–32)
Calcium: 9.1 mg/dL (ref 8.9–10.3)
Chloride: 94 mmol/L — ABNORMAL LOW (ref 98–111)
Creatinine, Ser: 1.6 mg/dL — ABNORMAL HIGH (ref 0.61–1.24)
GFR calc Af Amer: >60 mL/min (ref >60)
GFR calc non Af Amer: >60 mL/min (ref >60)
Glucose, Bld: 548 mg/dL (ref 70–99)
Potassium: 4.4 mmol/L (ref 3.5–5.1)
Sodium: 129 mmol/L — ABNORMAL LOW (ref 135–145)
Total Bilirubin: 3 mg/dL — ABNORMAL HIGH (ref 0.3–1.2)
Total Protein: 8.1 g/dL (ref 6.5–8.1)

## 2019-11-25 LAB — RAPID URINE DRUG SCREEN, HOSP PERFORMED
Amphetamines: POSITIVE — AB
Barbiturates: NOT DETECTED
Benzodiazepines: NOT DETECTED
Cocaine: NOT DETECTED
Opiates: NOT DETECTED
Tetrahydrocannabinol: NOT DETECTED

## 2019-11-25 LAB — POCT I-STAT EG7
Acid-base deficit: 16 mmol/L — ABNORMAL HIGH (ref 0.0–2.0)
Bicarbonate: 12.9 mmol/L — ABNORMAL LOW (ref 20.0–28.0)
Calcium, Ion: 1.22 mmol/L (ref 1.15–1.40)
HCT: 53 % — ABNORMAL HIGH (ref 39.0–52.0)
Hemoglobin: 18 g/dL — ABNORMAL HIGH (ref 13.0–17.0)
O2 Saturation: 96 %
Potassium: 4.2 mmol/L (ref 3.5–5.1)
Sodium: 130 mmol/L — ABNORMAL LOW (ref 135–145)
TCO2: 14 mmol/L — ABNORMAL LOW (ref 22–32)
pCO2, Ven: 39.4 mmHg — ABNORMAL LOW (ref 44.0–60.0)
pH, Ven: 7.122 — CL (ref 7.250–7.430)
pO2, Ven: 110 mmHg — ABNORMAL HIGH (ref 32.0–45.0)

## 2019-11-25 LAB — CBC WITH DIFFERENTIAL/PLATELET
Abs Immature Granulocytes: 0 10*3/uL (ref 0.00–0.07)
Basophils Absolute: 0.2 10*3/uL — ABNORMAL HIGH (ref 0.0–0.1)
Basophils Relative: 1 %
Eosinophils Absolute: 0 10*3/uL (ref 0.0–0.5)
Eosinophils Relative: 0 %
HCT: 51.5 % (ref 39.0–52.0)
Hemoglobin: 16.6 g/dL (ref 13.0–17.0)
Lymphocytes Relative: 22 %
Lymphs Abs: 3.4 10*3/uL (ref 0.7–4.0)
MCH: 27.3 pg (ref 26.0–34.0)
MCHC: 32.2 g/dL (ref 30.0–36.0)
MCV: 84.8 fL (ref 80.0–100.0)
Monocytes Absolute: 0.8 10*3/uL (ref 0.1–1.0)
Monocytes Relative: 5 %
Neutro Abs: 11.2 10*3/uL — ABNORMAL HIGH (ref 1.7–7.7)
Neutrophils Relative %: 72 %
Platelets: 393 10*3/uL (ref 150–400)
RBC: 6.07 MIL/uL — ABNORMAL HIGH (ref 4.22–5.81)
RDW: 12.8 % (ref 11.5–15.5)
WBC: 15.5 10*3/uL — ABNORMAL HIGH (ref 4.0–10.5)
nRBC: 0 % (ref 0.0–0.2)
nRBC: 0 /100 WBC

## 2019-11-25 LAB — CBG MONITORING, ED
Glucose-Capillary: 500 mg/dL — ABNORMAL HIGH (ref 70–99)
Glucose-Capillary: 455 mg/dL — ABNORMAL HIGH (ref 70–99)

## 2019-11-25 LAB — URINALYSIS, ROUTINE W REFLEX MICROSCOPIC
Bacteria, UA: NONE SEEN
Bilirubin Urine: NEGATIVE
Glucose, UA: >=500 mg/dL — AB
Hgb urine dipstick: NEGATIVE
Ketones, ur: 80 mg/dL — AB
Leukocytes,Ua: NEGATIVE
Nitrite: NEGATIVE
Protein, ur: NEGATIVE mg/dL
Specific Gravity, Urine: 1.02 (ref 1.005–1.030)
pH: 5 (ref 5.0–8.0)

## 2019-11-25 LAB — GROUP A STREP BY PCR: Group A Strep by PCR: NOT DETECTED

## 2019-11-25 LAB — LIPASE, BLOOD: Lipase: 16 U/L (ref 11–51)

## 2019-11-25 LAB — ETHANOL: Alcohol, Ethyl (B): <10 mg/dL (ref <10)

## 2019-11-25 LAB — SARS CORONAVIRUS 2 BY RT PCR (HOSPITAL ORDER, PERFORMED IN ~~LOC~~ HOSPITAL LAB): SARS Coronavirus 2: NEGATIVE

## 2019-11-25 MED ORDER — SODIUM CHLORIDE 0.9 % IV SOLN: INTRAVENOUS | Status: DC

## 2019-11-25 MED ORDER — SODIUM CHLORIDE 0.9 % IV BOLUS: 1000.0000 mL | Freq: Once | INTRAVENOUS | Status: DC

## 2019-11-25 MED ORDER — DEXTROSE-NACL 5-0.45 % IV SOLN: INTRAVENOUS | Status: DC

## 2019-11-25 MED ORDER — INSULIN REGULAR(HUMAN) IN NACL 100-0.9 UT/100ML-% IV SOLN
INTRAVENOUS | Status: DC
  Administered 2019-11-25: 10.5 [IU]/h via INTRAVENOUS
  Filled 2019-11-25: qty 100

## 2019-11-25 MED ORDER — DEXTROSE 50 % IV SOLN: 0.0000 mL | INTRAVENOUS | Status: DC | PRN

## 2019-11-25 MED ORDER — LORAZEPAM 2 MG/ML IJ SOLN
INTRAMUSCULAR | Status: AC
  Filled 2019-11-25: qty 1

## 2019-11-25 MED ORDER — LORAZEPAM 2 MG/ML IJ SOLN
1.0000 mg | Freq: Once | INTRAMUSCULAR | Status: AC
  Administered 2019-11-25: 1 mg via INTRAVENOUS
  Filled 2019-11-25: qty 1

## 2019-11-25 MED ORDER — SODIUM CHLORIDE 0.9 % IV BOLUS
1000.0000 mL | Freq: Once | INTRAVENOUS | Status: AC
  Administered 2019-11-25: 1000 mL via INTRAVENOUS

## 2019-11-25 MED ORDER — POTASSIUM CHLORIDE 10 MEQ/100ML IV SOLN
10.0000 meq | INTRAVENOUS | Status: DC
  Administered 2019-11-25: 10 meq via INTRAVENOUS
  Filled 2019-11-25: qty 100
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
## 2019-11-25 NOTE — ED Provider Notes (Signed)
[facility]PART[facility] Provider Note   CSN: [id number] Arrival date & time: 11/25/19  1601     History Chief Complaint  Patient presents with  . Drug Overdose    [name] [name]. is a 19 y.o. male.  Patient is a 19 year old male with a history of insulin-dependent diabetes, anxiety, ODD, sickle cell trait, seizures and substance abuse who presents after an ingestion of methamphetamine, Molly's and ecstasy.  He presents very anxious.  He said that he needs some water and that his throat is dry.  He also complains of some pain to the left side of his chest.  He complains of a sore throat.  He denies any vomiting.  He cannot tell me exactly what he did or how much he did other than he injected methamphetamine.  He told the triage area that he had also done [facility] and ecstasy.  He is a diabetic and states that he has been taking his insulin.  History is limited as he mostly is only nodding to questions and will not really verbalize.        Past Medical History:  Diagnosis Date  . ADD (attention deficit disorder)   . Allergy   . Anxiety   . Asthma   . Depression   . Epileptic seizure ([place])    "both petit and grand mal; last sz ~ 2 wk ago" (06/17/2013)  . Headache(784.0)    "2-3 times/wk usually" (06/17/2013)  . Heart murmur    "heard a slight one earlier today" (06/17/2013)  . Homeless   . Migraine    "maybe once/month; it's severe" (06/17/2013)  . ODD (oppositional defiant disorder)   . Sickle cell trait ([place])   . Type I diabetes mellitus ([place])    "that's what they're thinking now" (06/17/2013)  . Vision abnormalities    "takes him longer to focus cause; from his sz" (06/17/2013)    Patient Active Problem List   Diagnosis Date Noted  . 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 Suicidal ideation 10/06/2019  . Bipolar disorder ([place]) 10/06/2019  . Bipolar 1 disorder, depressed, severe ([place]) 10/05/2019  . MDD (major  depressive disorder) 09/28/2019  . Anxiety 07/10/2019  . Hyponatremia 07/10/2019  . Methamphetamine intoxication ([name]) 10/26/2018  . AKI (acute kidney injury) ([place]) 09/15/2018  . URI (upper respiratory infection) 09/15/2018  . Adjustment disorder with mixed disturbance of emotions and conduct   . Atypical chest pain 08/19/2018  . Borderline personality disorder ([place]) 02/26/2018  . MDD (major depressive disorder), recurrent, severe, with psychosis ([place]) 02/25/2018  . Major depressive disorder, recurrent severe without psychotic features ([place]) 07/24/2017  . DKA, type 1 ([place]) 07/16/2017  . Nausea & vomiting 07/16/2017  . Uncontrolled type 1 diabetes circulatory disorder erectile dysfunction ([place])   . DKA (diabetic ketoacidoses) ([place]) 12/18/2016  . History of seizures 12/01/2016  . History of migraine 12/01/2016  . Disordered eating 03/08/2015  . Ketonuria   . Adjustment reaction to medical therapy   . Non compliance w medication regimen   . Diabetic peripheral neuropathy associated with type 1 diabetes mellitus ([place]) 09/29/2014  . Dehydration 08/22/2014  . Hyperglycemia due to type 1 diabetes mellitus ([place]) 08/21/2014  . Type 1 diabetes mellitus with hyperglycemia ([place])   . Noncompliance   . Generalized abdominal pain   . Glycosuria   . Depression 07/12/2014  . Involuntary movements 06/13/2014  . Bilateral leg pain 06/13/2014  . 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 Suicidal ideations 03/31/2014  . ODD (oppositional defiant disorder) 03/03/2014  . MDD (major depressive disorder), recurrent episode, moderate ([place]) 02/16/2014  . Attention deficit hyperactivity disorder (ADHD), combined type, moderate 02/16/2014  . Microcytic anemia 02/15/2014  . Hyperglycemia 02/14/2014  . Goiter 02/04/2014  . 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[place]) 02/04/2014  . Acquired acanthosis nigricans 02/04/2014  . Obesity,  morbid ([place]) 02/04/2014  . Insulin resistance 02/04/2014  . Hyperinsulinemia 02/04/2014  . Hypoglycemia associated with diabetes ([place]) 02/02/2014  . Maladaptive health behaviors affecting medical condition 02/02/2014  . Hypoglycemia 02/02/2014  . Type 1 diabetes mellitus in patient 10 to 21 years of age with hemoglobin A1c goal of less than 7.5% ([place]) 01/26/2014  . Partial epilepsy with impairment of consciousness ([place]) 12/13/2013  . Generalized convulsive epilepsy ([place]) 12/13/2013  . Migraine without aura 12/13/2013  . Body mass index, pediatric, greater than or equal to 95th percentile for age 63/16/2015  . Asthma 12/13/2013  . Hypoglycemia unawareness in type 1 diabetes mellitus ([place]) 08/08/2013  . Epilepsy ([place]) 07/06/2013  . Short-term memory loss 07/06/2013  . Sickle cell trait ([place]) 07/06/2013  . Diabetes ([place]) 06/17/2013  . Diabetes mellitus, new onset ([place]) 06/17/2013    Past Surgical History:  Procedure Laterality Date  . CIRCUMCISION  2000  . FINGER SURGERY Left 2001   "crushed pinky; had to repair it" (06/17/2013)       Family History  Problem Relation Age of Onset  . Asthma Mother   . COPD Mother   . Other Mother        possible autoimmune, unclear  . Diabetes Maternal Grandmother   . Heart disease Maternal Grandmother   . Hypertension Maternal Grandmother   . Mental illness Maternal Grandmother   . Heart disease Maternal Grandfather   . Hyperlipidemia Paternal Grandmother   . Hyperlipidemia Paternal Grandfather   . Migraines Sister        Hemiplegic Migraines     Social History   Tobacco Use  . Smoking status: Current Every Day Smoker    Packs/day: 1.00    Types: Cigarettes  . Smokeless tobacco: Never Used  . Tobacco comment: Mom and dad smoke outside   Substance Use Topics  . Alcohol use: No    Alcohol/week: 0.0 standard drinks  . Drug use: Yes    Frequency: 7.0 times per week    Types: Marijuana    Home Medications Prior to Admission  medications   Medication Sig Start Date End Date Taking? Authorizing Provider  albuterol (PROVENTIL HFA;VENTOLIN HFA) 108 (90 Base) MCG/ACT inhaler Inhale 1 puff into the lungs every 4 (four) hours as needed for wheezing or shortness of breath. 09/16/18   Ghimire, Henreitta Leber, MD  ARIPiprazole (ABILIFY) 5 MG tablet Take 5 mg by mouth daily. 09/27/19   [provider]  blood glucose meter kit and supplies KIT Dispense based on patient and insurance preference. Use up to four times daily as directed. (FOR ICD-9 250.00, 250.01). 04/22/19   Robinson, [place] N, PA-C  buPROPion (WELLBUTRIN SR) 150 MG 12 hr tablet Take 150 mg by mouth daily. 09/27/19   [provider]  clotrimazole (LOTRIMIN) 1 % cream Apply to affected area 2 times daily 09/18/19   Wieters, Hallie C, PA-C  FLUoxetine (PROZAC) 20 MG capsule Take 1 capsule (20 mg total) by mouth daily. Patient not taking: Reported on 10/06/2019 09/20/18   Patrecia Pour, NP  glucose blood (FREESTYLE TEST STRIPS)  test strip Use as instructed 09/16/18   Jonetta Osgood, MD  hydrOXYzine (ATARAX/VISTARIL) 25 MG tablet Take 25 mg by mouth every 6 (six) hours as needed for anxiety.  06/27/19   [provider]  insulin aspart (NOVOLOG FLEXPEN) 100 UNIT/ML FlexPen Inject 1-15 Units into the skin 3 (three) times daily with meals. 1 unit/g of carbohydrate sliding scale. 07/10/19   Caren Griffins, MD  Insulin Glargine (LANTUS) 100 UNIT/ML Solostar Pen Inject 30 Units into the skin 2 (two) times daily. 07/10/19   Caren Griffins, MD  Insulin Pen Needle 32G X 8 MM MISC Use as directed 10/10/18   Kinnie Feil, PA-C  traZODone (DESYREL) 100 MG tablet Take 1 tablet (100 mg total) by mouth at bedtime as needed for sleep. 09/19/18   Patrecia Pour, NP    Allergies    Bee venom, Fish allergy, Shellfish allergy, and Tea  Review of Systems   Review of Systems  Constitutional: Negative for chills, diaphoresis, fatigue and fever.  HENT: Positive  for trouble swallowing (Dry throat). Negative for congestion, rhinorrhea and sneezing.   Eyes: Negative.   Respiratory: Negative for cough, chest tightness and shortness of breath.   Cardiovascular: Positive for chest pain. Negative for leg swelling.  Gastrointestinal: Positive for abdominal pain. Negative for blood in stool, diarrhea, nausea and vomiting.  Genitourinary: Negative for difficulty urinating, flank pain, frequency and hematuria.  Musculoskeletal: Negative for arthralgias and back pain.  Skin: Negative for rash.  Neurological: Negative for dizziness, speech difficulty, weakness, numbness and headaches.  Psychiatric/Behavioral: The patient is nervous/anxious.     Physical Exam Updated Vital Signs BP (!) 153/110   Pulse (!) 126   Temp 98.1 F (36.7 C) (Oral)   Resp 17   SpO2 100%   Physical Exam Constitutional:      Appearance: He is well-developed.     Comments: Patient is very anxious and wiggling around on the bed constantly.  No visible seizure activity.  He smells of ketones.  HENT:     Head: Normocephalic and atraumatic.     Mouth/Throat:     Mouth: Mucous membranes are dry.  Eyes:     Pupils: Pupils are equal, round, and reactive to light.  Cardiovascular:     Rate and Rhythm: Regular rhythm. Tachycardia present.     Heart sounds: Normal heart sounds.  Pulmonary:     Effort: Pulmonary effort is normal. Tachypnea present. No respiratory distress.     Breath sounds: Normal breath sounds. No wheezing or rales.  Chest:     Chest wall: No tenderness.  Abdominal:     General: Bowel sounds are normal.     Palpations: Abdomen is soft.     Tenderness: There is no abdominal tenderness. There is no guarding or rebound.  Musculoskeletal:        General: Normal range of motion.     Cervical back: Normal range of motion and neck supple.  Lymphadenopathy:     Cervical: No cervical adenopathy.  Skin:    General: Skin is warm and dry.     Findings: No rash.    Neurological:     General: No focal deficit present.     Mental Status: He is alert and oriented to person, place, and time.     ED Results / Procedures / Treatments   Labs (all labs ordered are listed, but only abnormal results are displayed) Labs Reviewed  COMPREHENSIVE METABOLIC PANEL - Abnormal; Notable for the  following components:      Result Value   Sodium 129 (*)    Chloride 94 (*)    CO2 13 (*)    Glucose, Bld 548 (*)    Creatinine, Ser 1.60 (*)    AST 44 (*)    ALT 46 (*)    Alkaline Phosphatase 133 (*)    Total Bilirubin 3.0 (*)    Anion gap 22 (*)    All other components within normal limits  CBC WITH DIFFERENTIAL/PLATELET - Abnormal; Notable for the following components:   WBC 15.5 (*)    RBC 6.07 (*)    Neutro Abs 11.2 (*)    Basophils Absolute 0.2 (*)    All other components within normal limits  URINALYSIS, ROUTINE W REFLEX MICROSCOPIC - Abnormal; Notable for the following components:   Color, Urine STRAW (*)    Glucose, UA >=500 (*)    Ketones, ur 80 (*)    All other components within normal limits  RAPID URINE DRUG SCREEN, HOSP PERFORMED - Abnormal; Notable for the following components:   Amphetamines POSITIVE (*)    All other components within normal limits  CBG MONITORING, ED - Abnormal; Notable for the following components:   Glucose-Capillary 500 (*)    All other components within normal limits  POCT I-STAT EG7 - Abnormal; Notable for the following components:   pH, Ven 7.122 (*)    pCO2, Ven 39.4 (*)    pO2, Ven 110.0 (*)    Bicarbonate 12.9 (*)    TCO2 14 (*)    Acid-base deficit 16.0 (*)    Sodium 130 (*)    HCT 53.0 (*)    Hemoglobin 18.0 (*)    All other components within normal limits  CBG MONITORING, ED - Abnormal; Notable for the following components:   Glucose-Capillary 455 (*)    All other components within normal limits  SARS CORONAVIRUS 2 BY RT PCR (HOSPITAL ORDER, [facility])  GROUP A STREP BY PCR   LIPASE, BLOOD  ETHANOL  BETA-HYDROXYBUTYRIC ACID  BETA-HYDROXYBUTYRIC ACID  BETA-HYDROXYBUTYRIC ACID  I-STAT VENOUS BLOOD GAS, ED    EKG EKG Interpretation  Date/Time:  Friday Nov 25 2019 16:01:28 EDT Ventricular Rate:  135 PR Interval:  124 QRS Duration: 78 QT Interval:  316 QTC Calculation: 474 R Axis:   83 Text Interpretation: Sinus tachycardia Septal infarct , age undetermined T wave abnormality, consider inferior ischemia Abnormal ECG SINCE LAST TRACING HEART RATE HAS INCREASED Confirmed by Malvin Johns [phone]) on 11/25/2019 4:32:29 PM   Radiology No results found.  Procedures Procedures (including critical care time)  Medications Ordered in ED Medications  insulin regular, human (MYXREDLIN) 100 units/ 100 mL infusion ( Intravenous Stopped 11/25/19 1935)  0.9 %  sodium chloride infusion ( Intravenous Stopped 11/25/19 1935)  dextrose 5 %-0.45 % sodium chloride infusion (has no administration in time range)  dextrose 50 % solution 0-50 mL (has no administration in time range)  potassium chloride 10 mEq in 100 mL IVPB (0 mEq Intravenous Stopped 11/25/19 1935)  sodium chloride 0.9 % bolus 1,000 mL (has no administration in time range)  LORazepam (ATIVAN) injection 1 mg (1 mg Intravenous Given 11/25/19 1703)  sodium chloride 0.9 % bolus 1,000 mL (0 mLs Intravenous Stopped 11/25/19 1907)    ED Course  I have reviewed the triage vital signs and the nursing notes.  Pertinent labs & imaging results that were available during my care of the patient were reviewed by  me and considered in my medical decision making (see chart for details).    MDM Rules/Calculators/A&P                      Patient is a 19 year old male who presents after an overdose.  Once he calmed down a bit and was able to give [place] more of a story, he states that he was given inadvertently without his knowledge mauling ecstasy.  He was high on that for several days he said and yesterday he got upset because his  grandmother who has dementia did not recognize him and he injected 3 or 4 doses of methamphetamine.  He said he has been clean for 3 years and just recently started doing drugs again.  He reports that he was supposed to graduate from high school today.  He said today he feels like he is withdrawing with anxiety in feels like he hurts all over.  He does complain of a little bit of a sore throat.  He feels like his throat is a little swollen.  He has no stridor or apparent difficulty swallowing.  He has some mild erythema in the posterior pharynx but no elevation of the tongue or peritonsillar swelling.  Rapid strep was ordered.  It may be more because of dry mucous membranes.  He was found to be in DKA.  His glucose is over 500 with acidosis.  He was given IV fluids and started on insulin drip per protocol.  He also is noted to have an AKI.  Will need admission for further treatment.  I spoke with Dr. Posey Pronto who will admit the pt.  CRITICAL CARE Performed by: Malvin Johns Total critical care time: 60 minutes Critical care time was exclusive of separately billable procedures and treating other patients. Critical care was necessary to treat or prevent imminent or life-threatening deterioration. Critical care was time spent personally by me on the following activities: development of treatment plan with patient and/or surrogate as well as nursing, discussions with consultants, evaluation of patient's response to treatment, examination of patient, obtaining history from patient or surrogate, ordering and performing treatments and interventions, ordering and review of laboratory studies, ordering and review of radiographic studies, pulse oximetry and re-evaluation of patient's condition.  Final Clinical Impression(s) / ED Diagnoses Final diagnoses:  Substance abuse ([place])  Diabetic ketoacidosis without coma associated with type 1 diabetes mellitus ([place])  AKI (acute kidney injury) [facility])    Rx / DC Orders ED  Discharge Orders    None       Malvin Johns, MD 11/25/19 2242

## 2019-11-25 NOTE — Consult Note (Signed)
ADDENDUM: At time of my initial evaluation in the emergency department patient has made the decision that he would like to leave [place].  I attempted to discuss the current situation and medical conditions that we would prefer to manage in the inpatient setting however he states he has made his decision.  He says he understands the risks of severe worsening decompensation and even death if he leaves without appropriate management but still like to leave [place].  He is currently awake, alert, and oriented X4 and does not meet requirement for involuntary commitment.   History and Physical    [name] [name]. KTG:[id number] DOB: 05-16-2001 DOA: 11/25/2019  PCP: [name] [name]  Patient coming from: Home  I have personally briefly reviewed patient's old medical records in [place]  Chief Complaint: Amphetamine use  HPI: [name] [name]. is a 19 y.o. male with medical history significant for type 1 diabetes with recurrent DKA, history of seizures, asthma, mood disorder, and substance use disorder with amphetamine use who presents to the ED for evaluation after drug use.  History limited from patient due to unwillingness to cooperate and therefore primarily obtained from EDP and chart review.  [name] EDP, patient reportedly used ecstasy [name] [name] a few days ago.  Yesterday he injected methamphetamines.  He was brought to the ED by acquaintances and only tells me he does not remember coming here.  He is not answering to further questioning but states that he wants to leave the hospital.  ED Course:  Initial vitals showed BP 153/110, pulse 127, RR 17, temp 90.1 Fahrenheit, SPO2 100% on room air.  Labs are notable for serum glucose 548, sodium 129, potassium 4.4, bicarb 13, BUN 15, creatinine 1.6, anion gap 22, WBC 15.5, hemoglobin 16.6, platelets 393,000, ethanol level undetectable, lipase 16.  Urinalysis showed 80 ketones, over 500 glucose, negative  nitrites, negative leukocytes, no bacteria microscopy.  UDS is positive for amphetamines.  VBG showed pH 7.122, PCO2 39.4, PO2 110.  Patient was started on insulin infusion and given IV Ativan 1 mg once.  Hospitalist service was consulted to admit for further evaluation and management.  Review of Systems:  Unable to obtain full review of systems due to cooperation.   Past Medical History:  Diagnosis Date   ADD (attention deficit disorder)    Allergy    Anxiety    Asthma    Depression    Epileptic seizure ([place])    "both petit and grand mal; last sz ~ 2 wk ago" (06/17/2013)   SKAJGOTL(572.6)    "2-3 times/wk usually" (06/17/2013)   Heart murmur    "heard a slight one earlier today" (06/17/2013)   Homeless    Migraine    "maybe once/month; it's severe" (06/17/2013)   ODD (oppositional defiant disorder)    Sickle cell trait ([place])    Type I diabetes mellitus ([place])    "that's what they're thinking now" (06/17/2013)   Vision abnormalities    "takes him longer to focus cause; from his sz" (06/17/2013)    Past Surgical History:  Procedure Laterality Date   CIRCUMCISION  2000   FINGER SURGERY Left 2001   "crushed pinky; had to repair it" (06/17/2013)    Social History:  reports that he has been smoking cigarettes. He has been smoking about 1.00 pack [name] day. He has never used smokeless tobacco. He reports current drug use. Frequency: 7.00 times [name] week. Drug: Marijuana. He reports that he does  not drink alcohol.  Allergies  Allergen Reactions   Bee Venom Anaphylaxis   Fish Allergy Anaphylaxis   Shellfish Allergy Anaphylaxis   Tea Anaphylaxis    Family History  Problem Relation Age of Onset   Asthma Mother    COPD Mother    Other Mother        possible autoimmune, unclear   Diabetes Maternal Grandmother    Heart disease Maternal Grandmother    Hypertension Maternal Grandmother    Mental illness Maternal Grandmother    Heart disease  Maternal Grandfather    Hyperlipidemia Paternal Grandmother    Hyperlipidemia Paternal Grandfather    Migraines Sister        Hemiplegic Migraines      Prior to Admission medications   Medication Sig Start Date End Date Taking? Authorizing Provider  albuterol (PROVENTIL HFA;VENTOLIN HFA) 108 (90 Base) MCG/ACT inhaler Inhale 1 puff into the lungs every 4 (four) hours as needed for wheezing or shortness of breath. 09/16/18   Ghimire, Henreitta Leber, MD  ARIPiprazole (ABILIFY) 5 MG tablet Take 5 mg by mouth daily. 09/27/19   [provider]  blood glucose meter kit and supplies KIT Dispense based on patient and insurance preference. Use up to four times daily as directed. (FOR ICD-9 250.00, 250.01). 04/22/19   Robinson, [place] N, PA-C  buPROPion (WELLBUTRIN SR) 150 MG 12 hr tablet Take 150 mg by mouth daily. 09/27/19   [provider]  clotrimazole (LOTRIMIN) 1 % cream Apply to affected area 2 times daily 09/18/19   Wieters, Hallie C, PA-C  FLUoxetine (PROZAC) 20 MG capsule Take 1 capsule (20 mg total) by mouth daily. Patient not taking: Reported on 10/06/2019 09/20/18   Patrecia Pour, NP  glucose blood (FREESTYLE TEST STRIPS) test strip Use as instructed 09/16/18   Jonetta Osgood, MD  hydrOXYzine (ATARAX/VISTARIL) 25 MG tablet Take 25 mg by mouth every 6 (six) hours as needed for anxiety.  06/27/19   [provider]  insulin aspart (NOVOLOG FLEXPEN) 100 UNIT/ML FlexPen Inject 1-15 Units into the skin 3 (three) times daily with meals. 1 unit/g of carbohydrate sliding scale. 07/10/19   Caren Griffins, MD  Insulin Glargine (LANTUS) 100 UNIT/ML Solostar Pen Inject 30 Units into the skin 2 (two) times daily. 07/10/19   Caren Griffins, MD  Insulin Pen Needle 32G X 8 MM MISC Use as directed 10/10/18   Kinnie Feil, PA-C  traZODone (DESYREL) 100 MG tablet Take 1 tablet (100 mg total) by mouth at bedtime as needed for sleep. 09/19/18   Patrecia Pour, NP    Physical  Exam: Vitals:   11/25/19 1602 11/25/19 1615 11/25/19 1630 11/25/19 1645  BP: (!) 160/132 (!) 142/99 (!) 153/91 (!) 153/110  Pulse: (!) 138 (!) 124 (!) 119 (!) 126  Resp: (!) 32 (!) 27 (!) 30 17  Temp: 98.1 F (36.7 C)     TempSrc: Oral     SpO2: 98% 99% 98% 100%   Exam limited due to cooperation. Constitutional: Resting in bed, agitated Eyes: PERRL, lids and conjunctivae normal ENMT: Mucous membranes are moist. Respiratory:  Normal respiratory effort. No accessory muscle use.  Cardiovascular: Tachycardic, No extremity edema. Abdomen: Did not allow examination Musculoskeletal: Moving all extremities equally Skin: no rashes, lesions, ulcers. No induration Neurologic: CN 2-12 grossly intact.  Moving all extremities equally. Psychiatric: Awake, alert and oriented x3.  He is upset and wants to leave.   Labs on Admission: I have personally reviewed  following labs and imaging studies  CBC: Recent Labs  Lab 11/25/19 1646 11/25/19 1656  WBC 15.5*  --   NEUTROABS 11.2*  --   HGB 16.6 18.0*  HCT 51.5 53.0*  MCV 84.8  --   PLT 393  --    Basic Metabolic Panel: Recent Labs  Lab 11/25/19 1646 11/25/19 1656  NA 129* 130*  K 4.4 4.2  CL 94*  --   CO2 13*  --   GLUCOSE 548*  --   BUN 15  --   CREATININE 1.60*  --   CALCIUM 9.1  --    GFR: CrCl cannot be calculated (Unknown ideal weight.). Liver Function Tests: Recent Labs  Lab 11/25/19 1646  AST 44*  ALT 46*  ALKPHOS 133*  BILITOT 3.0*  PROT 8.1  ALBUMIN 4.2   Recent Labs  Lab 11/25/19 1646  LIPASE 16   No results for input(s): AMMONIA in the last 168 hours. Coagulation Profile: No results for input(s): INR, PROTIME in the last 168 hours. Cardiac Enzymes: No results for input(s): CKTOTAL, CKMB, CKMBINDEX, TROPONINI in the last 168 hours. BNP (last 3 results) No results for input(s): PROBNP in the last 8760 hours. HbA1C: No results for input(s): HGBA1C in the last 72 hours. CBG: Recent Labs  Lab  11/25/19 1607 11/25/19 1910  GLUCAP 500* 455*   Lipid Profile: No results for input(s): CHOL, HDL, LDLCALC, TRIG, CHOLHDL, LDLDIRECT in the last 72 hours. Thyroid Function Tests: No results for input(s): TSH, T4TOTAL, FREET4, T3FREE, THYROIDAB in the last 72 hours. Anemia Panel: No results for input(s): VITAMINB12, FOLATE, FERRITIN, TIBC, IRON, RETICCTPCT in the last 72 hours. Urine analysis:    Component Value Date/Time   COLORURINE STRAW (A) 11/25/2019 1730   APPEARANCEUR CLEAR 11/25/2019 1730   LABSPEC 1.020 11/25/2019 1730   PHURINE 5.0 11/25/2019 1730   GLUCOSEU >=500 (A) 11/25/2019 1730   HGBUR NEGATIVE 11/25/2019 1730   BILIRUBINUR NEGATIVE 11/25/2019 1730   KETONESUR 80 (A) 11/25/2019 1730   PROTEINUR NEGATIVE 11/25/2019 1730   UROBILINOGEN 0.2 09/18/2019 1339   NITRITE NEGATIVE 11/25/2019 1730   LEUKOCYTESUR NEGATIVE 11/25/2019 1730    Radiological Exams on Admission: No results found.  EKG: Independently reviewed. Sinus tachycardia, T wave inversion lead III.  Rate is faster when compared to prior.  Assessment/Plan Principal Problem:   DKA (diabetic ketoacidoses) (HCC) Active Problems:   AKI (acute kidney injury) ([place])   Amphetamine-type substance use disorder, severe ([place])  Type 1 diabetes with DKA Substance use disorder Amphetamine use with withdrawal Acute kidney injury Mood disorder: Patient placed on insulin infusion [name] DKA protocol in the ED.  Initially requested PCU bed for further management of DKA, AKI, and management of possible impending amphetamine withdrawal however patient insisted on leaving [place] as documented above.  He left the ED directly and was not admitted to the floor.  Zada Finders MD [facility]  If 7PM-7AM, please contact night-coverage [email or web address]  11/25/2019, 10:38 PM

## 2019-11-25 NOTE — ED Notes (Signed)
Pt states they want to leave AMA. Provider at bedside. Pt threatening to remove his own IV. Pt was educated my Charity fundraiser and MD on the importance of him staying however, the patient is still demanding to leave.

## 2019-11-25 NOTE — ED Notes (Signed)
Pt. Insisted to leave AMA. Provider at bedside. Risk and benefits discussed. Pt. States, "I still want to leave".    

## 2019-11-25 NOTE — ED Triage Notes (Signed)
Pt arrived POV, not answering questions. Friend reports pt has been using meth, molly and ecstasy.

## 2019-11-27 ENCOUNTER — Other Ambulatory Visit: Payer: Self-pay

## 2019-11-27 ENCOUNTER — Emergency Department (HOSPITAL_COMMUNITY)
Admission: EM | Admit: 2019-11-27 | Discharge: 2019-11-27 | Disposition: A | Discharge status: Home / Self Care | Payer: Medicaid Other | Attending: Emergency Medicine | Admitting: Emergency Medicine

## 2019-11-27 ENCOUNTER — Emergency Department (HOSPITAL_COMMUNITY): Payer: Medicaid Other

## 2019-11-27 ENCOUNTER — Encounter (HOSPITAL_COMMUNITY): Payer: Self-pay

## 2019-11-27 DIAGNOSIS — K120 Recurrent oral aphthae: Secondary | ICD-10-CM | POA: Insufficient documentation

## 2019-11-27 DIAGNOSIS — Z79899 Other long term (current) drug therapy: Secondary | ICD-10-CM | POA: Diagnosis not present

## 2019-11-27 DIAGNOSIS — F17210 Nicotine dependence, cigarettes, uncomplicated: Secondary | ICD-10-CM | POA: Diagnosis not present

## 2019-11-27 DIAGNOSIS — J45909 Unspecified asthma, uncomplicated: Secondary | ICD-10-CM | POA: Diagnosis not present

## 2019-11-27 DIAGNOSIS — E1065 Type 1 diabetes mellitus with hyperglycemia: Secondary | ICD-10-CM | POA: Diagnosis not present

## 2019-11-27 DIAGNOSIS — F909 Attention-deficit hyperactivity disorder, unspecified type: Secondary | ICD-10-CM | POA: Insufficient documentation

## 2019-11-27 DIAGNOSIS — R739 Hyperglycemia, unspecified: Secondary | ICD-10-CM

## 2019-11-27 LAB — URINALYSIS, ROUTINE W REFLEX MICROSCOPIC
Bacteria, UA: NONE SEEN
Bilirubin Urine: NEGATIVE
Glucose, UA: >=500 mg/dL — AB
Hgb urine dipstick: NEGATIVE
Ketones, ur: 5 mg/dL — AB
Leukocytes,Ua: NEGATIVE
Nitrite: NEGATIVE
Protein, ur: NEGATIVE mg/dL
Specific Gravity, Urine: 1.024 (ref 1.005–1.030)
pH: 6 (ref 5.0–8.0)

## 2019-11-27 LAB — COMPREHENSIVE METABOLIC PANEL
ALT: 35 U/L (ref 0–44)
AST: 53 U/L — ABNORMAL HIGH (ref 15–41)
Albumin: 3.8 g/dL (ref 3.5–5.0)
Alkaline Phosphatase: 125 U/L (ref 38–126)
Anion gap: 14 (ref 5–15)
BUN: 15 mg/dL (ref 6–20)
CO2: 19 mmol/L — ABNORMAL LOW (ref 22–32)
Calcium: 8.4 mg/dL — ABNORMAL LOW (ref 8.9–10.3)
Chloride: 93 mmol/L — ABNORMAL LOW (ref 98–111)
Creatinine, Ser: 0.98 mg/dL (ref 0.61–1.24)
GFR calc Af Amer: >60 mL/min (ref >60)
GFR calc non Af Amer: >60 mL/min (ref >60)
Glucose, Bld: 725 mg/dL (ref 70–99)
Potassium: 6.1 mmol/L — ABNORMAL HIGH (ref 3.5–5.1)
Sodium: 126 mmol/L — ABNORMAL LOW (ref 135–145)
Total Bilirubin: 3 mg/dL — ABNORMAL HIGH (ref 0.3–1.2)
Total Protein: 5.9 g/dL — ABNORMAL LOW (ref 6.5–8.1)

## 2019-11-27 LAB — BASIC METABOLIC PANEL
Anion gap: 9 (ref 5–15)
BUN: 11 mg/dL (ref 6–20)
CO2: 20 mmol/L — ABNORMAL LOW (ref 22–32)
Calcium: 7.4 mg/dL — ABNORMAL LOW (ref 8.9–10.3)
Chloride: 107 mmol/L (ref 98–111)
Creatinine, Ser: 0.63 mg/dL (ref 0.61–1.24)
GFR calc Af Amer: >60 mL/min (ref >60)
GFR calc non Af Amer: >60 mL/min (ref >60)
Glucose, Bld: 250 mg/dL — ABNORMAL HIGH (ref 70–99)
Potassium: 3.1 mmol/L — ABNORMAL LOW (ref 3.5–5.1)
Sodium: 136 mmol/L (ref 135–145)

## 2019-11-27 LAB — CBC
HCT: 41.5 % (ref 39.0–52.0)
Hemoglobin: 14.2 g/dL (ref 13.0–17.0)
MCH: 27.6 pg (ref 26.0–34.0)
MCHC: 34.2 g/dL (ref 30.0–36.0)
MCV: 80.7 fL (ref 80.0–100.0)
Platelets: 279 10*3/uL (ref 150–400)
RBC: 5.14 MIL/uL (ref 4.22–5.81)
RDW: 12.3 % (ref 11.5–15.5)
WBC: 8 10*3/uL (ref 4.0–10.5)
nRBC: 0 % (ref 0.0–0.2)

## 2019-11-27 LAB — TROPONIN I (HIGH SENSITIVITY): Troponin I (High Sensitivity): <2 ng/L (ref <18)

## 2019-11-27 LAB — CBG MONITORING, ED
Glucose-Capillary: >600 mg/dL (ref 70–99)
Glucose-Capillary: 479 mg/dL — ABNORMAL HIGH (ref 70–99)
Glucose-Capillary: 375 mg/dL — ABNORMAL HIGH (ref 70–99)
Glucose-Capillary: 159 mg/dL — ABNORMAL HIGH (ref 70–99)
Glucose-Capillary: 152 mg/dL — ABNORMAL HIGH (ref 70–99)

## 2019-11-27 MED ORDER — SODIUM CHLORIDE 0.9 % IV BOLUS
1000.0000 mL | Freq: Once | INTRAVENOUS | Status: AC
  Administered 2019-11-27: 1000 mL via INTRAVENOUS

## 2019-11-27 MED ORDER — LIDOCAINE VISCOUS HCL 2 % MT SOLN
15.0000 mL | Freq: Once | OROMUCOSAL | Status: AC
  Administered 2019-11-27: 15 mL via OROMUCOSAL
  Filled 2019-11-27: qty 15

## 2019-11-27 MED ORDER — SODIUM CHLORIDE 0.9 % IV BOLUS
1000.0000 mL | INTRAVENOUS | Status: AC
  Administered 2019-11-27: 1000 mL via INTRAVENOUS

## 2019-11-27 MED ORDER — DEXTROSE 50 % IV SOLN: 0.0000 mL | INTRAVENOUS | Status: DC | PRN

## 2019-11-27 MED ORDER — DIPHENHYDRAMINE HCL 12.5 MG/5ML PO ELIX
25.0000 mg | ORAL_SOLUTION | Freq: Once | ORAL | Status: AC
  Administered 2019-11-27: 25 mg via ORAL
  Filled 2019-11-27: qty 10

## 2019-11-27 MED ORDER — ALUM & MAG HYDROXIDE-SIMETH 200-200-20 MG/5ML PO SUSP
15.0000 mL | Freq: Once | ORAL | Status: AC
  Administered 2019-11-27: 15 mL via ORAL
  Filled 2019-11-27: qty 30

## 2019-11-27 MED ORDER — INSULIN REGULAR(HUMAN) IN NACL 100-0.9 UT/100ML-% IV SOLN
INTRAVENOUS | Status: DC
  Administered 2019-11-27: 13 [IU]/h via INTRAVENOUS
  Filled 2019-11-27: qty 100

## 2019-11-27 MED ORDER — SODIUM CHLORIDE 0.9 % IV SOLN: INTRAVENOUS | Status: DC

## 2019-11-27 MED ORDER — SODIUM CHLORIDE 0.9% FLUSH
3.0000 mL | Freq: Once | INTRAVENOUS | Status: AC
  Administered 2019-11-27: 3 mL via INTRAVENOUS

## 2019-11-27 MED ORDER — DEXTROSE-NACL 5-0.45 % IV SOLN: INTRAVENOUS | Status: DC
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
## 2019-11-27 NOTE — Discharge Instructions (Addendum)
Take Vitamin B OTC for your mouth ulcers. You can mix benadryl 1 tsp with maalox 1 tsp and gargle over the ulcer.   Take your Lantus as usual when you get home.  Make an appointment to see your family doctor this week for recheck.  The sore in your mouth should heal over the next 2 to 5 days.  If this does not heal, you should get referral to an ear nose throat specialist to examine this.
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
## 2019-11-27 NOTE — ED Notes (Signed)
EDP attempted to assess patient, but patient was refusing to answer questions. Patient is now refusing to answer this RN's questions.

## 2019-11-27 NOTE — ED Notes (Signed)
Date and time results received: 11/27/19 7:45 AM  (use smartphrase ".now" to insert current time)  Test: Glucose Critical Value: 725  Name of Provider Notified: I Lynelle Doctor  Orders Received? Or Actions Taken?: Actions Taken: Notified Marchelle Folks, RN and I [place], EDP
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
## 2019-11-27 NOTE — ED Provider Notes (Signed)
Getting started on insulin drip.Came to ED for c/o oral ulcer. Physical Exam  BP 133/75   Pulse 100   Temp (!) 97.4 F (36.3 C) (Oral)   Resp (!) 21   Ht 5\' 10"  (1.778 m)   Wt 101.2 kg   SpO2 94%   BMI 32.03 kg/m   Physical Exam  ED Course/Procedures     Procedures  MDM   Patient blood sugar is now at 159.  He and feels improved.  Alert and appropriate.  Stable for discharge.      , MD 11/27/19 1019

## 2020-01-15 ENCOUNTER — Encounter (HOSPITAL_COMMUNITY): Payer: Self-pay

## 2020-01-15 ENCOUNTER — Observation Stay (HOSPITAL_COMMUNITY)
Admission: EM | Admit: 2020-01-15 | Discharge: 2020-01-16 | DRG: 638 | Discharge status: Against Medical Advice | Payer: Medicaid Other | Attending: Specialist | Admitting: Specialist

## 2020-01-15 ENCOUNTER — Emergency Department (HOSPITAL_COMMUNITY): Payer: Medicaid Other

## 2020-01-15 DIAGNOSIS — E1042 Type 1 diabetes mellitus with diabetic polyneuropathy: Secondary | ICD-10-CM | POA: Diagnosis not present

## 2020-01-15 DIAGNOSIS — W182XXA Fall in (into) shower or empty bathtub, initial encounter: Secondary | ICD-10-CM | POA: Diagnosis not present

## 2020-01-15 DIAGNOSIS — F319 Bipolar disorder, unspecified: Secondary | ICD-10-CM | POA: Diagnosis not present

## 2020-01-15 DIAGNOSIS — Z20822 Contact with and (suspected) exposure to covid-19: Secondary | ICD-10-CM | POA: Diagnosis present

## 2020-01-15 DIAGNOSIS — Z833 Family history of diabetes mellitus: Secondary | ICD-10-CM | POA: Diagnosis not present

## 2020-01-15 DIAGNOSIS — Z79899 Other long term (current) drug therapy: Secondary | ICD-10-CM | POA: Diagnosis not present

## 2020-01-15 DIAGNOSIS — Z91013 Allergy to seafood: Secondary | ICD-10-CM

## 2020-01-15 DIAGNOSIS — N179 Acute kidney failure, unspecified: Secondary | ICD-10-CM | POA: Diagnosis present

## 2020-01-15 DIAGNOSIS — G43009 Migraine without aura, not intractable, without status migrainosus: Secondary | ICD-10-CM | POA: Diagnosis present

## 2020-01-15 DIAGNOSIS — F988 Other specified behavioral and emotional disorders with onset usually occurring in childhood and adolescence: Secondary | ICD-10-CM | POA: Diagnosis not present

## 2020-01-15 DIAGNOSIS — Y93E1 Activity, personal bathing and showering: Secondary | ICD-10-CM | POA: Diagnosis not present

## 2020-01-15 DIAGNOSIS — R52 Pain, unspecified: Secondary | ICD-10-CM

## 2020-01-15 DIAGNOSIS — D573 Sickle-cell trait: Secondary | ICD-10-CM | POA: Diagnosis not present

## 2020-01-15 DIAGNOSIS — Z818 Family history of other mental and behavioral disorders: Secondary | ICD-10-CM | POA: Diagnosis not present

## 2020-01-15 DIAGNOSIS — F419 Anxiety disorder, unspecified: Secondary | ICD-10-CM | POA: Diagnosis present

## 2020-01-15 DIAGNOSIS — E1010 Type 1 diabetes mellitus with ketoacidosis without coma: Secondary | ICD-10-CM | POA: Diagnosis not present

## 2020-01-15 DIAGNOSIS — F1910 Other psychoactive substance abuse, uncomplicated: Secondary | ICD-10-CM | POA: Insufficient documentation

## 2020-01-15 DIAGNOSIS — F513 Sleepwalking [somnambulism]: Secondary | ICD-10-CM | POA: Diagnosis present

## 2020-01-15 DIAGNOSIS — F17210 Nicotine dependence, cigarettes, uncomplicated: Secondary | ICD-10-CM | POA: Diagnosis not present

## 2020-01-15 DIAGNOSIS — Z825 Family history of asthma and other chronic lower respiratory diseases: Secondary | ICD-10-CM

## 2020-01-15 DIAGNOSIS — F913 Oppositional defiant disorder: Secondary | ICD-10-CM | POA: Diagnosis not present

## 2020-01-15 DIAGNOSIS — G40109 Localization-related (focal) (partial) symptomatic epilepsy and epileptic syndromes with simple partial seizures, not intractable, without status epilepticus: Secondary | ICD-10-CM | POA: Diagnosis not present

## 2020-01-15 DIAGNOSIS — Z91030 Bee allergy status: Secondary | ICD-10-CM

## 2020-01-15 DIAGNOSIS — Z794 Long term (current) use of insulin: Secondary | ICD-10-CM | POA: Diagnosis not present

## 2020-01-15 DIAGNOSIS — F15129 Other stimulant abuse with intoxication, unspecified: Secondary | ICD-10-CM | POA: Diagnosis present

## 2020-01-15 DIAGNOSIS — Z91018 Allergy to other foods: Secondary | ICD-10-CM

## 2020-01-15 DIAGNOSIS — J45909 Unspecified asthma, uncomplicated: Secondary | ICD-10-CM | POA: Diagnosis not present

## 2020-01-15 DIAGNOSIS — Z9119 Patient's noncompliance with other medical treatment and regimen: Secondary | ICD-10-CM

## 2020-01-15 DIAGNOSIS — E875 Hyperkalemia: Secondary | ICD-10-CM | POA: Diagnosis present

## 2020-01-15 DIAGNOSIS — E1110 Type 2 diabetes mellitus with ketoacidosis without coma: Secondary | ICD-10-CM | POA: Diagnosis present

## 2020-01-15 DIAGNOSIS — E872 Acidosis, unspecified: Secondary | ICD-10-CM

## 2020-01-15 LAB — COMPREHENSIVE METABOLIC PANEL
ALT: 53 U/L — ABNORMAL HIGH (ref 0–44)
AST: 35 U/L (ref 15–41)
Albumin: 4.1 g/dL (ref 3.5–5.0)
Alkaline Phosphatase: 157 U/L — ABNORMAL HIGH (ref 38–126)
BUN: 27 mg/dL — ABNORMAL HIGH (ref 6–20)
CO2: <7 mmol/L — ABNORMAL LOW (ref 22–32)
Calcium: 8.4 mg/dL — ABNORMAL LOW (ref 8.9–10.3)
Chloride: 86 mmol/L — ABNORMAL LOW (ref 98–111)
Creatinine, Ser: 2.91 mg/dL — ABNORMAL HIGH (ref 0.61–1.24)
GFR calc Af Amer: 34 mL/min — ABNORMAL LOW (ref >60)
GFR calc non Af Amer: 30 mL/min — ABNORMAL LOW (ref >60)
Glucose, Bld: 1399 mg/dL (ref 70–99)
Potassium: >7.5 mmol/L (ref 3.5–5.1)
Sodium: 124 mmol/L — ABNORMAL LOW (ref 135–145)
Total Bilirubin: 3 mg/dL — ABNORMAL HIGH (ref 0.3–1.2)
Total Protein: 7.8 g/dL (ref 6.5–8.1)

## 2020-01-15 LAB — LACTIC ACID, PLASMA: Lactic Acid, Venous: 6.3 mmol/L (ref 0.5–1.9)

## 2020-01-15 LAB — URINALYSIS, ROUTINE W REFLEX MICROSCOPIC
Bacteria, UA: NONE SEEN
Bilirubin Urine: NEGATIVE
Glucose, UA: >=500 mg/dL — AB
Ketones, ur: 80 mg/dL — AB
Leukocytes,Ua: NEGATIVE
Nitrite: NEGATIVE
Protein, ur: 30 mg/dL — AB
Specific Gravity, Urine: 1.018 (ref 1.005–1.030)
pH: 5 (ref 5.0–8.0)

## 2020-01-15 LAB — I-STAT VENOUS BLOOD GAS, ED
Acid-base deficit: 27 mmol/L — ABNORMAL HIGH (ref 0.0–2.0)
Bicarbonate: 4.4 mmol/L — ABNORMAL LOW (ref 20.0–28.0)
Calcium, Ion: 0.99 mmol/L — ABNORMAL LOW (ref 1.15–1.40)
HCT: 51 % (ref 39.0–52.0)
Hemoglobin: 17.3 g/dL — ABNORMAL HIGH (ref 13.0–17.0)
O2 Saturation: 96 %
Potassium: 7.5 mmol/L (ref 3.5–5.1)
Sodium: 122 mmol/L — ABNORMAL LOW (ref 135–145)
TCO2: 5 mmol/L — ABNORMAL LOW (ref 22–32)
pCO2, Ven: 21.8 mmHg — ABNORMAL LOW (ref 44.0–60.0)
pH, Ven: 6.913 — CL (ref 7.250–7.430)
pO2, Ven: 137 mmHg — ABNORMAL HIGH (ref 32.0–45.0)

## 2020-01-15 LAB — CBC WITH DIFFERENTIAL/PLATELET
Abs Immature Granulocytes: 2.22 10*3/uL — ABNORMAL HIGH (ref 0.00–0.07)
Basophils Absolute: 0.1 10*3/uL (ref 0.0–0.1)
Basophils Relative: 0 %
Eosinophils Absolute: 0 10*3/uL (ref 0.0–0.5)
Eosinophils Relative: 0 %
HCT: 53.3 % — ABNORMAL HIGH (ref 39.0–52.0)
Hemoglobin: 15.6 g/dL (ref 13.0–17.0)
Immature Granulocytes: 8 %
Lymphocytes Relative: 9 %
Lymphs Abs: 2.7 10*3/uL (ref 0.7–4.0)
MCH: 27.8 pg (ref 26.0–34.0)
MCHC: 29.3 g/dL — ABNORMAL LOW (ref 30.0–36.0)
MCV: 95 fL (ref 80.0–100.0)
Monocytes Absolute: 1.4 10*3/uL — ABNORMAL HIGH (ref 0.1–1.0)
Monocytes Relative: 5 %
Neutro Abs: 22.1 10*3/uL — ABNORMAL HIGH (ref 1.7–7.7)
Neutrophils Relative %: 78 %
Platelets: 389 10*3/uL (ref 150–400)
RBC: 5.61 MIL/uL (ref 4.22–5.81)
RDW: 12.5 % (ref 11.5–15.5)
WBC: 28.4 10*3/uL — ABNORMAL HIGH (ref 4.0–10.5)
nRBC: 0 % (ref 0.0–0.2)

## 2020-01-15 LAB — CBG MONITORING, ED
Glucose-Capillary: >600 mg/dL (ref 70–99)
Glucose-Capillary: >600 mg/dL (ref 70–99)
Glucose-Capillary: >600 mg/dL (ref 70–99)
Glucose-Capillary: >600 mg/dL (ref 70–99)
Glucose-Capillary: >600 mg/dL (ref 70–99)

## 2020-01-15 LAB — RAPID URINE DRUG SCREEN, HOSP PERFORMED
Amphetamines: POSITIVE — AB
Barbiturates: NOT DETECTED
Benzodiazepines: NOT DETECTED
Cocaine: NOT DETECTED
Opiates: NOT DETECTED
Tetrahydrocannabinol: NOT DETECTED

## 2020-01-15 LAB — SARS CORONAVIRUS 2 BY RT PCR (HOSPITAL ORDER, PERFORMED IN ~~LOC~~ HOSPITAL LAB): SARS Coronavirus 2: NEGATIVE

## 2020-01-15 MED ORDER — CALCIUM GLUCONATE-NACL 1-0.675 GM/50ML-% IV SOLN
1.0000 g | Freq: Once | INTRAVENOUS | Status: AC
  Administered 2020-01-15: 1000 mg via INTRAVENOUS
  Filled 2020-01-15: qty 50

## 2020-01-15 MED ORDER — SODIUM CHLORIDE 0.9 % IV SOLN: INTRAVENOUS | Status: DC

## 2020-01-15 MED ORDER — CALCIUM GLUCONATE 10 % IV SOLN
1.0000 g | Freq: Once | INTRAVENOUS | Status: DC
  Filled 2020-01-15: qty 10

## 2020-01-15 MED ORDER — LACTATED RINGERS IV BOLUS
1000.0000 mL | Freq: Once | INTRAVENOUS | Status: AC
  Administered 2020-01-15: 1000 mL via INTRAVENOUS

## 2020-01-15 MED ORDER — DEXTROSE 50 % IV SOLN: 0.0000 mL | INTRAVENOUS | Status: DC | PRN

## 2020-01-15 MED ORDER — CALCIUM GLUCONATE-NACL 2-0.675 GM/100ML-% IV SOLN: 2.0000 g | Freq: Once | INTRAVENOUS | Status: DC

## 2020-01-15 MED ORDER — CALCIUM GLUCONATE-NACL 1-0.675 GM/50ML-% IV SOLN: 1.0000 g | Freq: Once | INTRAVENOUS | Status: DC

## 2020-01-15 MED ORDER — SODIUM ZIRCONIUM CYCLOSILICATE 10 G PO PACK
10.0000 g | PACK | Freq: Once | ORAL | Status: AC
  Administered 2020-01-15: 10 g via ORAL
  Filled 2020-01-15: qty 1

## 2020-01-15 MED ORDER — SODIUM BICARBONATE 8.4 % IV SOLN
Freq: Once | INTRAVENOUS | Status: AC
  Filled 2020-01-15: qty 100

## 2020-01-15 MED ORDER — INSULIN REGULAR(HUMAN) IN NACL 100-0.9 UT/100ML-% IV SOLN
INTRAVENOUS | Status: DC
  Administered 2020-01-15: 8 [IU]/h via INTRAVENOUS
  Filled 2020-01-15: qty 100

## 2020-01-15 MED ORDER — DEXTROSE-NACL 5-0.45 % IV SOLN: INTRAVENOUS | Status: DC

## 2020-01-15 MED ORDER — HEPARIN SODIUM (PORCINE) 5000 UNIT/ML IJ SOLN
5000.0000 [IU] | Freq: Three times a day (TID) | INTRAMUSCULAR | Status: DC
  Administered 2020-01-15: 5000 [IU] via SUBCUTANEOUS
  Filled 2020-01-15: qty 1

## 2020-01-15 MED ORDER — HALOPERIDOL LACTATE 5 MG/ML IJ SOLN
3.0000 mg | Freq: Once | INTRAMUSCULAR | Status: AC
  Administered 2020-01-15: 3 mg via INTRAVENOUS
  Filled 2020-01-15: qty 1

## 2020-01-15 MED ORDER — SODIUM CHLORIDE 0.9 % IV BOLUS
1000.0000 mL | INTRAVENOUS | Status: AC
  Administered 2020-01-15 (×2): 1000 mL via INTRAVENOUS

## 2020-01-15 NOTE — ED Notes (Signed)
Attempted to call report to 3M 

## 2020-01-15 NOTE — ED Triage Notes (Signed)
Pt bib [facility] for OD on ectasy and back pain from a fall. Pt took 5 pills around noon and fell in the shower. Hx cbg [address] 130's 40-50 RR. Placed on O2 for comfort. Cool extremities.

## 2020-01-15 NOTE — ED Notes (Signed)
Pt initally agreed to blood draw.  While placing tourniquet pt proceed to yell in my face "No".  When asked if pt was refusing blood draw he yelled "yes, I want a nurse to give me something for pain".

## 2020-01-15 NOTE — ED Notes (Signed)
2nd IV attempted by 3 RN's. Ultrasound Attempted by ED RN. Unable to obtain a second PIV.

## 2020-01-15 NOTE — ED Notes (Signed)
Pt states "I can't breathe". Pt is on the monitor with SpO2 of 97%, RR 40, A/Ox4. Celine Mans, PA made aware. Pt educated on how his breathing is compensating for his acidosis, and pain medications may cause his respiratory drive to be compromised.

## 2020-01-15 NOTE — ED Provider Notes (Signed)
[facility] Provider Note   CSN: [id number] Arrival date & time: 01/15/20  1832     History Chief Complaint  Patient presents with  . Drug Overdose  . Back Pain    [name] [name]. is a 19 y.o. male.  HPI      19yo male with history of Type 1 DM, bipolar disorder, polysubstance use, epilepsy presents with concern for back pain after fall in the shower after taking ecstasy.   Reports taking 5 ecstasy pills this AM.  Glucose has been high.  History is limited as he continues to yell about his back pain. Reports back pain all over/diffuse. Denies head trauma. Denies cough, fever, abdominal pain, dysuria, chest pain.   Past Medical History:  Diagnosis Date  . ADD (attention deficit disorder)   . Allergy   . Anxiety   . Asthma   . Depression   . Epileptic seizure ([place])    "both petit and grand mal; last sz ~ 2 wk ago" (06/17/2013)  . Headache(784.0)    "2-3 times/wk usually" (06/17/2013)  . Heart murmur    "heard a slight one earlier today" (06/17/2013)  . Homeless   . Migraine    "maybe once/month; it's severe" (06/17/2013)  . ODD (oppositional defiant disorder)   . Sickle cell trait ([place])   . Type I diabetes mellitus ([place])    "that's what they're thinking now" (06/17/2013)  . Vision abnormalities    "takes him longer to focus cause; from his sz" (06/17/2013)    Patient Active Problem List   Diagnosis Date Noted  . Polysubstance abuse ([place])   . Hyperkalemia   . Hypocalcemia   . 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 Suicidal ideation 10/06/2019  . Bipolar disorder ([place]) 10/06/2019  . Bipolar 1 disorder, depressed, severe ([place]) 10/05/2019  . MDD (major depressive disorder) 09/28/2019  . Anxiety 07/10/2019  . Hyponatremia 07/10/2019  . Methamphetamine intoxication ([place]) 10/26/2018  . AKI (acute kidney injury) ([place]) 09/15/2018  . URI (upper respiratory infection) 09/15/2018  . Adjustment  disorder with mixed disturbance of emotions and conduct   . Atypical chest pain 08/19/2018  . Borderline personality disorder ([place]) 02/26/2018  . MDD (major depressive disorder), recurrent, severe, with psychosis ([place]) 02/25/2018  . Major depressive disorder, recurrent severe without psychotic features ([place]) 07/24/2017  . DKA, type 1 ([place]) 07/16/2017  . Nausea & vomiting 07/16/2017  . Uncontrolled type 1 diabetes circulatory disorder erectile dysfunction ([place])   . DKA (diabetic ketoacidoses) ([place]) 12/18/2016  . History of seizures 12/01/2016  . History of migraine 12/01/2016  . Disordered eating 03/08/2015  . Ketonuria   . Adjustment reaction to medical therapy   . Non compliance w medication regimen   . Diabetic peripheral neuropathy associated with type 1 diabetes mellitus ([place]) 09/29/2014  . Dehydration 08/22/2014  . Hyperglycemia due to type 1 diabetes mellitus ([place]) 08/21/2014  . Type 1 diabetes mellitus with hyperglycemia ([place])   . Noncompliance   . Generalized abdominal pain   . Glycosuria   . Depression 07/12/2014  . Involuntary movements 06/13/2014  . Bilateral leg pain 06/13/2014  . 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 Suicidal ideations 03/31/2014  . ODD (oppositional defiant disorder) 03/03/2014  . MDD (major depressive disorder), recurrent episode, moderate ([place]) 02/16/2014  . Attention deficit hyperactivity disorder (ADHD), combined type, moderate 02/16/2014  . Microcytic anemia 02/15/2014  . Hyperglycemia 02/14/2014  . Goiter  02/04/2014  . 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[place]) 02/04/2014  . Acquired acanthosis nigricans 02/04/2014  . Obesity, morbid ([place]) 02/04/2014  . Insulin resistance 02/04/2014  . Hyperinsulinemia 02/04/2014  . Hypoglycemia associated with diabetes ([place]) 02/02/2014  . Maladaptive health behaviors affecting medical condition 02/02/2014  . Hypoglycemia  02/02/2014  . Type 1 diabetes mellitus in patient 67 to 21 years of age with hemoglobin A1c goal of less than 7.5% ([place]) 01/26/2014  . Partial epilepsy with impairment of consciousness ([place]) 12/13/2013  . 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 Migraine without aura 12/13/2013  . Body mass index, pediatric, greater than or equal to 95th percentile for age 87/16/2015  . Asthma 12/13/2013  . Hypoglycemia unawareness in type 1 diabetes mellitus ([place]) 08/08/2013  . Epilepsy ([place]) 07/06/2013  . Short-term memory loss 07/06/2013  . Sickle cell trait ([place]) 07/06/2013  . Diabetes ([place]) 06/17/2013  . Diabetes mellitus, new onset ([place]) 06/17/2013    Past Surgical History:  Procedure Laterality Date  . CIRCUMCISION  2000  . FINGER SURGERY Left 2001   "crushed pinky; had to repair it" (06/17/2013)       Family History  Problem Relation Age of Onset  . Asthma Mother   . COPD Mother   . Other Mother        possible autoimmune, unclear  . Diabetes Maternal Grandmother   . Heart disease Maternal Grandmother   . Hypertension Maternal Grandmother   . Mental illness Maternal Grandmother   . Heart disease Maternal Grandfather   . Hyperlipidemia Paternal Grandmother   . Hyperlipidemia Paternal Grandfather   . Migraines Sister        Hemiplegic Migraines     Social History   Tobacco Use  . Smoking status: Current Every Day Smoker    Packs/day: 0.50    Types: Cigarettes  . Smokeless tobacco: Never Used  . Tobacco comment: Mom and dad smoke outside   Vaping Use  . Vaping Use: Former  Substance Use Topics  . Alcohol use: No    Alcohol/week: 0.0 standard drinks  . Drug use: Yes    Frequency: 7.0 times per week    Types: Marijuana, Methamphetamines    Home Medications Prior to Admission medications   Medication Sig Start Date End Date Taking? Authorizing Provider  albuterol (PROVENTIL HFA;VENTOLIN HFA) 108 (90 Base) MCG/ACT inhaler Inhale 1 puff into the lungs every 4  (four) hours as needed for wheezing or shortness of breath. 09/16/18   Ghimire, Henreitta Leber, MD  ARIPiprazole (ABILIFY) 5 MG tablet Take 5 mg by mouth daily. 09/27/19   [provider]  blood glucose meter kit and supplies KIT Dispense based on patient and insurance preference. Use up to four times daily as directed. (FOR ICD-9 250.00, 250.01). 04/22/19   Robinson, [place] N, PA-C  buPROPion (WELLBUTRIN SR) 150 MG 12 hr tablet Take 150 mg by mouth daily. 09/27/19   [provider]  clotrimazole (LOTRIMIN) 1 % cream Apply to affected area 2 times daily 09/18/19   Wieters, [employer], PA-C  FLOVENT HFA 110 MCG/ACT inhaler Inhale 2 puffs into the lungs in the morning and at bedtime. 10/12/19   [provider]  FLUoxetine (PROZAC) 20 MG capsule Take 1 capsule (20 mg total) by mouth daily. Patient not taking: Reported on 10/06/2019 09/20/18   Patrecia Pour, NP  gabapentin (NEURONTIN) 300 MG capsule Take 300 mg by mouth daily.  01/11/20   [provider]  glucose blood (  FREESTYLE TEST STRIPS) test strip Use as instructed 09/16/18   Jonetta Osgood, MD  hydrOXYzine (ATARAX/VISTARIL) 25 MG tablet Take 25 mg by mouth daily.  12/22/19   [provider]  insulin aspart (NOVOLOG FLEXPEN) 100 UNIT/ML FlexPen Inject 1-15 Units into the skin 3 (three) times daily with meals. 1 unit/g of carbohydrate sliding scale. 07/10/19   Caren Griffins, MD  Insulin Glargine (LANTUS) 100 UNIT/ML Solostar Pen Inject 30 Units into the skin 2 (two) times daily. 07/10/19   Gherghe, Vella Redhead, MD  LATUDA 20 MG TABS tablet Take 20 mg by mouth daily.  01/11/20   [provider]  MINIPRESS 1 MG capsule Take 1 mg by mouth daily.  01/11/20   [provider]    Allergies    Bee venom, Fish allergy, Shellfish allergy, and Tea  Review of Systems   Review of Systems  Constitutional: Negative for fever.  HENT: Negative for congestion.   Eyes: Negative for visual disturbance.    Respiratory: Negative for cough and shortness of breath.   Cardiovascular: Negative for chest pain.  Gastrointestinal: Negative for abdominal pain, blood in stool, nausea and vomiting.  Genitourinary: Negative for dysuria.  Musculoskeletal: Positive for back pain.  Skin: Negative for rash.  Neurological: Negative for headaches.    Physical Exam Updated Vital Signs BP (!) 158/132   Pulse (!) 131   Temp 97.9 F (36.6 C) (Oral)   Resp (!) 31   SpO2 100%   Physical Exam Vitals and nursing note reviewed.  Constitutional:      General: He is not in acute distress.    Appearance: He is well-developed. He is not diaphoretic.  HENT:     Head: Normocephalic and atraumatic.  Eyes:     Conjunctiva/sclera: Conjunctivae normal.  Cardiovascular:     Rate and Rhythm: Regular rhythm. Tachycardia present.     Heart sounds: Normal heart sounds. No murmur heard.  No friction rub. No gallop.   Pulmonary:     Effort: Pulmonary effort is normal. Tachypnea present. No respiratory distress.     Breath sounds: Normal breath sounds. No wheezing or rales.  Abdominal:     General: There is no distension.     Palpations: Abdomen is soft.     Tenderness: There is no abdominal tenderness. There is no guarding.  Musculoskeletal:     Cervical back: Normal range of motion.     Comments: Diffuse tenderness to back, no pinpoint tenderness  Skin:    General: Skin is warm and dry.  Neurological:     Mental Status: He is alert and oriented to person, place, and time.     Sensory: No sensory deficit.     Motor: Motor function is intact.     ED Results / Procedures / Treatments   Labs (all labs ordered are listed, but only abnormal results are displayed) Labs Reviewed  CBC WITH DIFFERENTIAL/PLATELET - Abnormal; Notable for the following components:      Result Value   WBC 28.4 (*)    HCT 53.3 (*)    MCHC 29.3 (*)    Neutro Abs 22.1 (*)    Monocytes Absolute 1.4 (*)    Abs Immature Granulocytes  2.22 (*)    All other components within normal limits  COMPREHENSIVE METABOLIC PANEL - Abnormal; Notable for the following components:   Sodium 124 (*)    Potassium >7.5 (*)    Chloride 86 (*)    CO2 <7 (*)  Glucose, Bld 1,399 (*)    BUN 27 (*)    Creatinine, Ser 2.91 (*)    Calcium 8.4 (*)    ALT 53 (*)    Alkaline Phosphatase 157 (*)    Total Bilirubin 3.0 (*)    GFR calc non Af Amer 30 (*)    GFR calc Af Amer 34 (*)    All other components within normal limits  URINALYSIS, ROUTINE W REFLEX MICROSCOPIC - Abnormal; Notable for the following components:   Color, Urine STRAW (*)    Glucose, UA >=500 (*)    Hgb urine dipstick SMALL (*)    Ketones, ur 80 (*)    Protein, ur 30 (*)    All other components within normal limits  LACTIC ACID, PLASMA - Abnormal; Notable for the following components:   Lactic Acid, Venous 6.3 (*)    All other components within normal limits  RAPID URINE DRUG SCREEN, HOSP PERFORMED - Abnormal; Notable for the following components:   Amphetamines POSITIVE (*)    All other components within normal limits  I-STAT VENOUS BLOOD GAS, ED - Abnormal; Notable for the following components:   pH, Ven 6.913 (*)    pCO2, Ven 21.8 (*)    pO2, Ven 137.0 (*)    Bicarbonate 4.4 (*)    TCO2 5 (*)    Acid-base deficit 27.0 (*)    Sodium 122 (*)    Potassium 7.5 (*)    Calcium, Ion 0.99 (*)    Hemoglobin 17.3 (*)    All other components within normal limits  CBG MONITORING, ED - Abnormal; Notable for the following components:   Glucose-Capillary >600 (*)    All other components within normal limits  CBG MONITORING, ED - Abnormal; Notable for the following components:   Glucose-Capillary >600 (*)    All other components within normal limits  CBG MONITORING, ED - Abnormal; Notable for the following components:   Glucose-Capillary >600 (*)    All other components within normal limits  CBG MONITORING, ED - Abnormal; Notable for the following components:    Glucose-Capillary >600 (*)    All other components within normal limits  CBG MONITORING, ED - Abnormal; Notable for the following components:   Glucose-Capillary >600 (*)    All other components within normal limits  CBG MONITORING, ED - Abnormal; Notable for the following components:   Glucose-Capillary >600 (*)    All other components within normal limits  SARS CORONAVIRUS 2 BY RT PCR (HOSPITAL ORDER, [facility])  BETA-HYDROXYBUTYRIC ACID  LACTIC ACID, PLASMA  HIV ANTIBODY (ROUTINE TESTING W REFLEX)  BASIC METABOLIC PANEL  BASIC METABOLIC PANEL  BASIC METABOLIC PANEL  BASIC METABOLIC PANEL  HEMOGLOBIN A1C  CBC  MAGNESIUM  PHOSPHORUS  BASIC METABOLIC PANEL    EKG EKG Interpretation  Date/Time:  Sunday January 15 2020 20:38:53 EDT Ventricular Rate:  123 PR Interval:    QRS Duration: 97 QT Interval:  317 QTC Calculation: 454 R Axis:   114 Text Interpretation: Sinus tachycardia Probable left atrial enlargement Right axis deviation Abnormal Q suggests anterior infarct Borderline T wave abnormalities Borderline ST elevation, lateral leads peaked TW new from prior Confirmed by Gareth Morgan [phone]) on 01/15/2020 8:53:13 PM   Radiology DG Lumbar Spine 1 View  Result Date: 01/15/2020 CLINICAL DATA:  Pain EXAM: THORACIC SPINE - 1 VIEW; LUMBAR SPINE - 1 VIEW COMPARISON:  Chest radiograph dated Nov 27, 2019 FINDINGS: Evaluation is severely limited by single view technique.  There is no definite fracture involving the lumbar spine. The disc heights appear to be relatively well preserved. There is no definite fracture of the thoracic spine. The disc heights appear to be relatively well preserved. IMPRESSION: 1. Severely limited study secondary to single view technique. 2. No definite acute osseous abnormality detected. Electronically Signed   By: Constance Holster M.D.   On: 01/15/2020 20:17   DG Thoracic Spine 1 View  Result Date: 01/15/2020 CLINICAL DATA:   Pain EXAM: THORACIC SPINE - 1 VIEW; LUMBAR SPINE - 1 VIEW COMPARISON:  Chest radiograph dated Nov 27, 2019 FINDINGS: Evaluation is severely limited by single view technique. There is no definite fracture involving the lumbar spine. The disc heights appear to be relatively well preserved. There is no definite fracture of the thoracic spine. The disc heights appear to be relatively well preserved. IMPRESSION: 1. Severely limited study secondary to single view technique. 2. No definite acute osseous abnormality detected. Electronically Signed   By: Constance Holster M.D.   On: 01/15/2020 20:17    Procedures .Critical Care Performed by: Gareth Morgan, MD Authorized by: Gareth Morgan, MD   Critical care provider statement:    Critical care time (minutes):  70   Critical care was time spent personally by me on the following activities:  Discussions with consultants, evaluation of patient's response to treatment, examination of patient, ordering and performing treatments and interventions, ordering and review of laboratory studies, ordering and review of radiographic studies, pulse oximetry, re-evaluation of patient's condition, obtaining history from patient or surrogate and review of old charts   (including critical care time)  Medications Ordered in ED Medications  sodium bicarbonate 100 mEq in dextrose 5 % 1,000 mL infusion ( Intravenous New Bag/Given 01/15/20 2109)  heparin injection 5,000 Units (5,000 Units Subcutaneous Given 01/15/20 2301)  0.9 %  sodium chloride infusion ( Intravenous New Bag/Given 01/15/20 2148)  dextrose 5 %-0.45 % sodium chloride infusion (has no administration in time range)  insulin regular, human (MYXREDLIN) 100 units/ 100 mL infusion (8 Units/hr Intravenous New Bag/Given 01/15/20 2149)  dextrose 50 % solution 0-50 mL (has no administration in time range)  calcium gluconate 1 g/ 50 mL sodium chloride IVPB (0 mg Intravenous Paused 01/15/20 2359)  lactated ringers bolus  1,000 mL (0 mLs Intravenous Stopped 01/15/20 2001)  haloperidol lactate (HALDOL) injection 3 mg (3 mg Intravenous Given 01/15/20 1913)  sodium chloride 0.9 % bolus 1,000 mL (0 mLs Intravenous Stopped 01/15/20 2346)  sodium zirconium cyclosilicate (LOKELMA) packet 10 g (10 g Oral Given 01/15/20 2101)  calcium gluconate 1 g/ 50 mL sodium chloride IVPB (0 g Intravenous Stopped 01/15/20 2048)  lactated ringers bolus 1,000 mL (0 mLs Intravenous Paused 01/15/20 2359)    ED Course  I have reviewed the triage vital signs and the nursing notes.  Pertinent labs & imaging results that were available during my care of the patient were reviewed by me and considered in my medical decision making (see chart for details).    MDM Rules/Calculators/A&P                          19yo male with history of Type 1 DM, bipolar disorder, polysubstance use, epilepsy presents with concern for back pain after fall in the shower after taking ecstasy.  Arrives tachypneic, tachycardic. Doubt acute back injury, normal LE strength and sensation.    Labs significant for hyperkalemia with a potassium greater than 7.5, bicarb less than  7, acute kidney injury with a creatinine of 2.91 and glucose of 1399.  pH 6.9.  EKG shows peaked T waves.  Given insulin, calcium, bicarb drip.  He did attempt to leave several times, however discussed significant illness.  Is not clear that he has a capacity at this time to make this decision given significant acidosis as well as drug intoxication--however did discuss if he does decide to leave at this time IVC is appropriate.    Final Clinical Impression(s) / ED Diagnoses Final diagnoses:  Diabetic ketoacidosis without coma associated with type 1 diabetes mellitus (HCC)  Hyperkalemia  Lactic acidosis  Acute kidney injury [facility])    Rx / DC Orders ED Discharge Orders    None       Gareth Morgan, MD 01/16/20 [phone]

## 2020-01-15 NOTE — ED Notes (Signed)
Pt screaming since arrival. Unable to fully triage. Pt can follow directions if he chooses.

## 2020-01-15 NOTE — H&P (Addendum)
NAME:  Sriman Tally., MRN:  [id number], DOB:  2001/04/28, LOS: 0 ADMISSION DATE:  01/15/2020, CONSULTATION DATE:  01/15/20 REFERRING MD:  Billy Fischer  CHIEF COMPLAINT:  AMS   Brief History   Nima Kemppainen. is a 19 y.o. male who was admitted 7/18 with severe DKA after ecstasy ingestion several hours earlier.  History of present illness   Christophe Rising. is a 19 y.o. male who has a PMH as outlined below including poorly controlled DM with frequent ED presentations for DKA and polysubstance abuse (previously admitted to MDMA, cocaine, meth).  He presented to [facility] ED 7/18 after a fall after he took ecstasy earlier tyat day.  Reportedly took  around 5 pills then slipped and fell in the shower but did not lose consciousness.  In ED, he was initially agitated and threatened to leave AMA (was just in ED in May for DKA and left AMA at that time).  He is mostly compliant with insulin at home but unsure if he has missed any recent doses.  Labs returned and were notable for significant DKA.  PCCM called for admission  Past Medical History  has Diabetes ([place]); Diabetes mellitus, new onset ([place]); Epilepsy ([place]); Short-term memory loss; Sickle cell trait ([place]); Hypoglycemia unawareness in type 1 diabetes mellitus ([place]); Partial epilepsy with impairment of consciousness ([place]); Generalized convulsive epilepsy ([place]); Migraine without aura; Body mass index, pediatric, greater than or equal to 95th percentile for age; Asthma; Type 1 diabetes mellitus in patient 44 to 21 years of age with hemoglobin A1c goal of less than 7.5% ([place]); Hypoglycemia associated with diabetes ([place]); Maladaptive health behaviors affecting medical condition; Hypoglycemia; Goiter; 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[place]); Acquired acanthosis nigricans; Obesity, morbid ([place]); Insulin resistance; Hyperinsulinemia; Hyperglycemia; Microcytic anemia; MDD (major depressive disorder), recurrent episode, moderate ([place]);  Attention deficit hyperactivity disorder (ADHD), combined type, moderate; ODD (oppositional defiant disorder); Suicidal ideations; Somatic symptom disorder, persistent, moderate; Sleepwalking disorder; Involuntary movements; Bilateral leg pain; Depression; Type 1 diabetes mellitus with hyperglycemia ([place]); Noncompliance; Generalized abdominal pain; Glycosuria; Hyperglycemia due to type 1 diabetes mellitus ([place]); Dehydration; Diabetic peripheral neuropathy associated with type 1 diabetes mellitus ([place]); Non compliance w medication regimen; Ketonuria; Adjustment reaction to medical therapy; Disordered eating; History of seizures; History of migraine; DKA (diabetic ketoacidoses) ([place]); Uncontrolled type 1 diabetes circulatory disorder erectile dysfunction ([name]); DKA, type 1 ([place]); Nausea & vomiting; Major depressive disorder, recurrent severe without psychotic features ([place]); MDD (major depressive disorder), recurrent, severe, with psychosis ([place]); Borderline personality disorder ([place]); Atypical chest pain; Adjustment disorder with mixed disturbance of emotions and conduct; AKI (acute kidney injury) ([place]); URI (upper respiratory infection); Methamphetamine intoxication ([place]); Anxiety; Hyponatremia; MDD (major depressive disorder); Bipolar 1 disorder, depressed, severe ([place]); Suicidal ideation; Bipolar disorder ([place]); and Amphetamine-type substance use disorder, severe ([place]) on their problem list.  [facility] Events   7/18 > admit.  Consults:  None.  Procedures:  None.  Significant Diagnostic Tests:  None.  Micro Data:  COVID 7/18 >   Antimicrobials:  None.   Interim history/subjective:  Tachypneic.  Very thirsty, asking for sips and to wash mouth out.  Objective:  Blood pressure (!) 143/80, pulse (!) 130, temperature 97.9 F (36.6 C), temperature source Oral, resp. rate (!) 36, SpO2 100 %.        Intake/Output Summary (Last 24 hours) at 01/15/2020 2130 Last data filed at 01/15/2020  2048 Gross per 24 hour  Intake 2000 ml  Output --  Net  2000 ml   There were no vitals filed for this visit.  Examination: General: Young male, in NAD. Neuro: Slightly agitated but easily directable.  No deficits.  MAE's. HEENT: [place]/AT. Sclerae anicteric.  EOMI.  MM very dry. Cardiovascular: Tachy, regular, no M/R/G.  Lungs: [place] respirations.  CTA bilaterally, No W/R/R.   Abdomen: BS x 4, soft, NT/ND.  Musculoskeletal: No gross deformities, no edema.  Skin: Multiple tattoos throughout.  Otherwise intact, warm, no rashes.  Assessment & Plan:   Severe DKA with underlying poorly controlled DM - presumed 2/2 non-compliance and certainly exacerbated with polysubstance abuse. - Admit to ICU. - Fluids / insulin per DKA protocol. - Frequent BMP's. - Needs strict outpatient follow up and DM education counseling to prevent bouncebacks.  Ecstasy intoxication - reportedly consumed around noon 7/18 (9-10 hours ago). Hx polysubstance abuse (has previously admitted to MDMA, cocaine, meth). - Supportive care. - F/u UDS. - Substance abuse counseling once stabilized.  Pseudohyponatremia - 2/2 hyperglycemia. Hyperkalemia - 2/2 acidosis.  S/p lokelma, insulin, HCO3, 1g Ca gluconate. AKI - 2/2 hypovolemia. Hypocalcemia. - Continue fluids and insulin per DKA protocol. - Additional 1g Ca gluconate. - Frequent BMP's.  Leukocytosis - presumed acute phase reactant 2/2 above. - Monitor clinically.  Hx anxiety, depression, ADD, ? Seizures. - Hold home aripiprazole, bupropion, fluoxetine.  Best Practice:  Diet: NPO. Pain/Anxiety/Delirium protocol (if indicated): N/A. VAP protocol (if indicated): N/A. DVT prophylaxis: SCD's / Heparin. GI prophylaxis: N/A. Glucose control: DKA protocol. Mobility: Bedrest. Code Status: Full. Family Communication: None. Disposition: ICU.  Labs   CBC: Recent Labs  Lab 01/15/20 1921 01/15/20 2023  WBC 28.4*  --   NEUTROABS 22.1*  --   HGB 15.6 17.3*   HCT 53.3* 51.0  MCV 95.0  --   PLT 389  --    Basic Metabolic Panel: Recent Labs  Lab 01/15/20 1921 01/15/20 2023  NA 124* 122*  K >7.5* 7.5*  CL 86*  --   CO2 <7*  --   GLUCOSE 1,399*  --   BUN 27*  --   CREATININE 2.91*  --   CALCIUM 8.4*  --    GFR: CrCl cannot be calculated (Unknown ideal weight.). Recent Labs  Lab 01/15/20 1921 01/15/20 2053  WBC 28.4*  --   LATICACIDVEN  --  6.3*   Liver Function Tests: Recent Labs  Lab 01/15/20 1921  AST 35  ALT 53*  ALKPHOS 157*  BILITOT 3.0*  PROT 7.8  ALBUMIN 4.1   No results for input(s): LIPASE, AMYLASE in the last 168 hours. No results for input(s): AMMONIA in the last 168 hours. ABG    Component Value Date/Time   PHART 7.311 (L) 09/24/2018 0003   PCO2ART 28.4 (L) 09/24/2018 0003   PO2ART 103.0 09/24/2018 0003   HCO3 4.4 (L) 01/15/2020 2023   TCO2 5 (L) 01/15/2020 2023   ACIDBASEDEF 27.0 (H) 01/15/2020 2023   O2SAT 96.0 01/15/2020 2023    Coagulation Profile: No results for input(s): INR, PROTIME in the last 168 hours. Cardiac Enzymes: No results for input(s): CKTOTAL, CKMB, CKMBINDEX, TROPONINI in the last 168 hours. HbA1C: Hgb A1c MFr Bld  Date/Time Value Ref Range Status  08/09/2018 11:12 AM 11.5 (H) 4.8 - 5.6 % Final    Comment:    (NOTE) Pre diabetes:          5.7%-6.4% Diabetes:              >6.4% Glycemic control for   <7.0% adults with  diabetes   04/05/2018 05:58 AM 10.8 (H) 4.8 - 5.6 % Final    Comment:    (NOTE) Pre diabetes:          5.7%-6.4% Diabetes:              >6.4% Glycemic control for   <7.0% adults with diabetes    CBG: Recent Labs  Lab 01/15/20 2126  GLUCAP >600*    Review of Systems:   All negative; except for those that are bolded, which indicate positives.  Constitutional: weight loss, weight gain, night sweats, fevers, chills, fatigue, weakness, dry mouth, thirsty. HEENT: headaches, sore throat, sneezing, nasal congestion, post nasal drip, difficulty  swallowing, tooth/dental problems, visual complaints, visual changes, ear aches. Neuro: difficulty with speech, weakness, numbness, ataxia. CV:  chest pain, orthopnea, PND, swelling in lower extremities, dizziness, palpitations, syncope.  Resp: cough, hemoptysis, dyspnea, wheezing. GI: heartburn, indigestion, abdominal pain, nausea, vomiting, diarrhea, constipation, change in bowel habits, loss of appetite, hematemesis, melena, hematochezia.  GU: dysuria, change in color of urine, urgency or frequency, flank pain, hematuria. MSK: joint pain or swelling, decreased range of motion. Psych: change in mood or affect, depression, anxiety, suicidal ideations, homicidal ideations. Skin: rash, itching, bruising.   Past medical history  He,  has a past medical history of ADD (attention deficit disorder), Allergy, Anxiety, Asthma, Depression, Epileptic seizure ([place]), Headache(784.0), Heart murmur, Homeless, Migraine, ODD (oppositional defiant disorder), Sickle cell trait ([place]), Type I diabetes mellitus ([place]), and Vision abnormalities.   Surgical History    Past Surgical History:  Procedure Laterality Date   CIRCUMCISION  2000   FINGER SURGERY Left 2001   "crushed pinky; had to repair it" (06/17/2013)     Social History   reports that he has been smoking cigarettes. He has been smoking about 0.50 packs per day. He has never used smokeless tobacco. He reports current drug use. Frequency: 7.00 times per week. Drugs: Marijuana and Methamphetamines. He reports that he does not drink alcohol.   Family history   His family history includes Asthma in his mother; COPD in his mother; Diabetes in his maternal grandmother; Heart disease in his maternal grandfather and maternal grandmother; Hyperlipidemia in his paternal grandfather and paternal grandmother; Hypertension in his maternal grandmother; Mental illness in his maternal grandmother; Migraines in his sister; Other in his mother.   Allergies Allergies    Allergen Reactions   Bee Venom Anaphylaxis   Fish Allergy Anaphylaxis   Shellfish Allergy Anaphylaxis   Tea Anaphylaxis     Home meds  Prior to Admission medications   Medication Sig Start Date End Date Taking? Authorizing Provider  albuterol (PROVENTIL HFA;VENTOLIN HFA) 108 (90 Base) MCG/ACT inhaler Inhale 1 puff into the lungs every 4 (four) hours as needed for wheezing or shortness of breath. 09/16/18   Ghimire, Henreitta Leber, MD  ARIPiprazole (ABILIFY) 5 MG tablet Take 5 mg by mouth daily. 09/27/19   [provider]  blood glucose meter kit and supplies KIT Dispense based on patient and insurance preference. Use up to four times daily as directed. (FOR ICD-9 250.00, 250.01). 04/22/19   Robinson, [place] N, PA-C  buPROPion (WELLBUTRIN SR) 150 MG 12 hr tablet Take 150 mg by mouth daily. 09/27/19   [provider]  clotrimazole (LOTRIMIN) 1 % cream Apply to affected area 2 times daily 09/18/19   Wieters, [employer], PA-C  FLOVENT HFA 110 MCG/ACT inhaler Inhale 2 puffs into the lungs in the morning and at bedtime. 10/12/19   [provider]  FLUoxetine (PROZAC) 20 MG capsule Take 1 capsule (20 mg total) by mouth daily. Patient not taking: Reported on 10/06/2019 09/20/18   Patrecia Pour, NP  glucose blood (FREESTYLE TEST STRIPS) test strip Use as instructed 09/16/18   Ghimire, Henreitta Leber, MD  insulin aspart (NOVOLOG FLEXPEN) 100 UNIT/ML FlexPen Inject 1-15 Units into the skin 3 (three) times daily with meals. 1 unit/g of carbohydrate sliding scale. 07/10/19   Caren Griffins, MD  Insulin Glargine (LANTUS) 100 UNIT/ML Solostar Pen Inject 30 Units into the skin 2 (two) times daily. 07/10/19   Caren Griffins, MD    Critical care time: 99 Min.    Montey Hora, [facility] 01/15/2020, 9:30 PM  Patient seen examined and chart reviewed, case discussed with Mr Shearon Stalls.  Agree with above assessment and plan.

## 2020-01-15 NOTE — ED Notes (Signed)
Pt continues to rip of his monitor and stating he would like to leave AMA. MD Dalene Seltzer is at bedside.

## 2020-01-16 ENCOUNTER — Other Ambulatory Visit: Payer: Self-pay

## 2020-01-16 ENCOUNTER — Ambulatory Visit (HOSPITAL_COMMUNITY)
Admission: EM | Admit: 2020-01-16 | Discharge: 2020-01-16 | Disposition: A | Discharge status: Home / Self Care | Payer: Medicaid Other

## 2020-01-16 DIAGNOSIS — F1520 Other stimulant dependence, uncomplicated: Secondary | ICD-10-CM

## 2020-01-16 LAB — BASIC METABOLIC PANEL
Anion gap: 28 — ABNORMAL HIGH (ref 5–15)
BUN: 22 mg/dL — ABNORMAL HIGH (ref 6–20)
CO2: 7 mmol/L — ABNORMAL LOW (ref 22–32)
Calcium: 8.7 mg/dL — ABNORMAL LOW (ref 8.9–10.3)
Chloride: 104 mmol/L (ref 98–111)
Creatinine, Ser: 2.05 mg/dL — ABNORMAL HIGH (ref 0.61–1.24)
GFR calc Af Amer: 52 mL/min — ABNORMAL LOW (ref >60)
GFR calc non Af Amer: 45 mL/min — ABNORMAL LOW (ref >60)
Glucose, Bld: 436 mg/dL — ABNORMAL HIGH (ref 70–99)
Potassium: 4.5 mmol/L (ref 3.5–5.1)
Sodium: 139 mmol/L (ref 135–145)

## 2020-01-16 LAB — HIV ANTIBODY (ROUTINE TESTING W REFLEX): HIV Screen 4th Generation wRfx: NONREACTIVE

## 2020-01-16 LAB — GLUCOSE, CAPILLARY
Glucose-Capillary: 312 mg/dL — ABNORMAL HIGH (ref 70–99)
Glucose-Capillary: 377 mg/dL — ABNORMAL HIGH (ref 70–99)
Glucose-Capillary: 431 mg/dL — ABNORMAL HIGH (ref 70–99)
Glucose-Capillary: 518 mg/dL (ref 70–99)
Glucose-Capillary: >600 mg/dL (ref 70–99)

## 2020-01-16 LAB — CBG MONITORING, ED: Glucose-Capillary: >600 mg/dL (ref 70–99)

## 2020-01-16 LAB — MRSA PCR SCREENING: MRSA by PCR: NEGATIVE

## 2020-01-16 LAB — HEMOGLOBIN A1C
Hgb A1c MFr Bld: 11.3 % — ABNORMAL HIGH (ref 4.8–5.6)
Mean Plasma Glucose: 277.61 mg/dL

## 2020-01-16 LAB — LACTIC ACID, PLASMA: Lactic Acid, Venous: 3.3 mmol/L (ref 0.5–1.9)

## 2020-01-16 LAB — BETA-HYDROXYBUTYRIC ACID: Beta-Hydroxybutyric Acid: 6.81 mmol/L — ABNORMAL HIGH (ref 0.05–0.27)

## 2020-01-16 MED ORDER — SODIUM CHLORIDE 0.9% FLUSH: 10.0000 mL | INTRAVENOUS | Status: DC | PRN

## 2020-01-16 MED ORDER — SODIUM BICARBONATE 8.4 % IV SOLN
Freq: Once | INTRAVENOUS | Status: AC
  Filled 2020-01-16: qty 100

## 2020-01-16 MED ORDER — SODIUM CHLORIDE 0.9% FLUSH
10.0000 mL | Freq: Two times a day (BID) | INTRAVENOUS | Status: DC
  Administered 2020-01-16: 10 mL

## 2020-01-16 NOTE — BH Assessment (Signed)
Comprehensive Clinical Assessment (CCA) Note  01/16/2020 [name] [name] [id number]   Patient presented as a walk-in to [facility] voluntary due to substance abuse and depression. Patient denied present SI, HI and psychosis. Patient reported hx of SI with no plan after he got mad on yesterday but would not hurt himself. When asked what made you upset patient stated "I don't remember". Patient reported taking "5 ectasy pills". Patient reported no SI attempt when taking the pills, stating "I need help for substance abuse". Patient reported to NT prior to TTS assessing him that he was having back pains. During assessment patient appeared to be fatigued stating, "I am very sleepy after taking ectasy". Patient unable to share how long he has been taking the pills. Patient reported auditory hallucinations yesterday after taking ecstasy pills. When asked about voices, patient stated "I don't remember". Patient denied current auditory and visual hallucinations. Patient not responding to internal stimuli. Patient currently receives outpatient mental health services from the ACT Team.   Per medical record, patient was admitted to [facility] on 01/15/2020 with severe DKA. Patient left AMA around 3 am on 01/16/2020. [name], NP, discussed patient returning to to the Emergency Department for treatment/medical clearance. Patient states that he does not want to go to the emergency department. Discussed the risks of not receiving proper treatment for DKA. Patient states that he would rather go home.   Patient reported being married with 1 young child. Patient reported being unemployed. Patient denied access to guns or weapons. Collateral contact, none given at this time.   [name], NP, patient does not meet inpatient criteria. Patient will follow up with ACT Team.   Visit Diagnosis:      ICD-10-CM   1. 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 Family friend Referral name: No data recorded Referral phone number: No data recorded  Whom do you see for routine medical problems? Primary Care  Practice/Facility Name: [facility]  Practice/Facility Phone Number: No data recorded Name of Contact: No data recorded Contact Number: No data recorded Contact Fax Number: No data recorded Prescriber Name: No data recorded Prescriber Address (if known): No data recorded  What Is the Reason for Your Visit/Call Today? No data recorded How Long Has This Been Causing You Problems? <Week  What Do You Feel Would Help You the Most Today? Therapy   Have You Recently Been in Any Inpatient Treatment (Hospital/Detox/Crisis Center/28-Day Program)? No  Name/Location of Program/Hospital:No data recorded How Long Were You There? No data recorded When Were You Discharged? No data recorded  Have You Ever Received Services From [facility] Before? Yes  Who Do You See at [facility]? medical   Have You Recently Had Any Thoughts About Hurting Yourself? Yes  Are You Planning to Commit Suicide/Harm Yourself At This time? No   Have you Recently Had Thoughts About Hurting Someone [name]? No  Explanation: No data recorded  Have You Used Any Alcohol or Drugs in the Past 24 Hours? Yes  How Long Ago Did You Use Drugs or Alcohol? 1600  What Did You Use and How Much? "5 estacy pills"   Do You Currently Have a Therapist/Psychiatrist? Yes  Name of Therapist/Psychiatrist: [facility]   Have You Been Recently Discharged From Any Office Practice or Programs? No  Explanation of Discharge From Practice/Program: No data recorded  CCA Screening Triage Referral Assessment Type of Contact: Face-to-Face  Is this Initial or Reassessment? No data recorded Date Telepsych consult ordered in CHL:  No data recorded Time Telepsych consult ordered in CHL:  No data recorded  Patient  Reported Information Reviewed? Yes  Patient Left Without Being Seen? No data recorded Reason for Not Completing Assessment: No data recorded  Collateral Involvement: none reported   Does Patient Have a Court Appointed Legal Guardian? No data recorded Name and Contact of Legal Guardian: Self.   If Minor and Not Living with Parent(s), Who has Custody? No data recorded Is CPS involved or ever been involved? Never  Is APS involved or ever been involved? Never   Patient Determined To Be At Risk for Harm To Self or Others Based on Review of Patient Reported Information or Presenting Complaint? No  Method: No data recorded Availability of Means: No data recorded Intent: No data recorded Notification Required: No data recorded Additional Information for Danger to Others Potential: No data recorded Additional Comments for Danger to Others Potential: No data recorded Are There Guns or Other Weapons in Your Home? No data recorded Types of Guns/Weapons: No data recorded Are These Weapons Safely Secured?                            No data recorded Who Could Verify You Are Able To Have These Secured: No data recorded Do You Have any Outstanding Charges, Pending Court Dates, Parole/Probation? No data recorded Contacted To Inform of Risk of Harm To Self or Others: No data recorded  Location of Assessment: [facility] Assessment [facility]   Does Patient Present under Involuntary Commitment? No  IVC Papers Initial File Date: No data recorded  [place] of Residence: [place]   Patient Currently Receiving the Following Services: ACTT Psychologist, educational)   Determination of Need: Routine (7 days) (Patient will follow up with his ACT Team on tomorrow.)   Options For Referral: No data recorded    CCA Biopsychosocial  Intake/Chief Complaint:  CCA Intake With Chief Complaint Chief Complaint/Presenting Problem: substance abuse and depression Patient's Currently Reported  Symptoms/Problems: substance abuse and depression Individual's Strengths: uta Individual's Preferences: uta Individual's Abilities: uta Type of Services Patient Feels Are Needed: substance abuse therapy  Mental Health Symptoms Depression:  Depression: Hopelessness, Irritability, Change in energy/activity, Worthlessness, Sleep (too much or little), Fatigue, Duration of symptoms less than two weeks  Mania:  Mania: None  Anxiety:   Anxiety: Fatigue  Psychosis:  Psychosis: None  Trauma:  Trauma: None  Obsessions:  Obsessions: N/A  Compulsions:  Compulsions: N/A  Inattention:  Inattention: N/A  Hyperactivity/Impulsivity:  Hyperactivity/Impulsivity: N/A  Oppositional/Defiant Behaviors:  Oppositional/Defiant Behaviors: N/A  Emotional Irregularity:  Emotional Irregularity: N/A  Other Mood/Personality Symptoms:      Mental Status Exam Appearance and self-care  Stature:  Stature: Average  Weight:  Weight: Average weight  Clothing:  Clothing: Age-appropriate  Grooming:  Grooming: Normal  Cosmetic use:  Cosmetic Use: Age appropriate  Posture/gait:  Posture/Gait: Normal  Motor activity:  Motor Activity: Not Remarkable  Sensorium  Attention:  Attention: Normal  Concentration:  Concentration: Normal  Orientation:  Orientation: Person, Place, Situation, Time  Recall/memory:  Recall/Memory: Defective in Remote  Affect and Mood  Affect:  Affect: Depressed, Anxious  Mood:  Mood: Depressed, Anxious  Relating  Eye contact:  Eye Contact: Normal  Facial expression:  Facial Expression: Sad  Attitude toward examiner:  Attitude Toward Examiner:  Cooperative  Thought and Language  Speech flow: Speech Flow: Slow  Thought content:  Thought Content: Appropriate to Mood and Circumstances  Preoccupation:  Preoccupations: None  Hallucinations:  Hallucinations: Auditory ("I heard something when I was taking those pills, it scared me". When asked what did you here "I don't remember".)  Organization:      Company secretary of Knowledge:  Fund of Knowledge: Fair  Intelligence:  Intelligence: Average  Abstraction:  Abstraction:  Industrial/product designer)  Judgement:  Judgement: Impaired  Reality Testing:     Insight:  Insight: Fair  Decision Making:  Decision Making: Normal  Social Functioning  Social Maturity:  Social Maturity:  Rich Reining)  Social Judgement:  Social Judgement:  Rich Reining)  Stress  Stressors:  Stressors:  (uta)  Coping Ability:  Coping Ability:  Industrial/product designer)  Skill Deficits:  Skill Deficits:  Rich Reining)  Supports:  Supports: Family     Religion: Religion/Spirituality Are You A Religious Person?:  Industrial/product designer)  Leisure/Recreation: Leisure / Recreation Do You Have Hobbies?:  Rich Reining)  Exercise/Diet: Exercise/Diet Do You Exercise?:  (uta) Have You Gained or Lost A Significant Amount of Weight in the Past Six Months?:  (uta) Do You Follow a Special Diet?:  (uta) Do You Have Any Trouble Sleeping?: Yes Explanation of Sleeping Difficulties: approx 4 hours, hard to fall asleep   CCA Employment/Education  Employment/Work Situation: Employment / Work Situation Employment situation: Unemployed Patient's job has been impacted by current illness: No 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) Where was the patient employed at that time?:  (uta) Has patient ever been in the [employer]?:  Industrial/product designer)  Education: Education Is Patient Currently Attending School?: No Last Grade Completed:  (n/a) Name of High School:  (n/a) Did You Graduate From [employer]?:  (uta) Did You Attend College?:  (uta) Did You Attend Graduate School?:  Rich Reining) Did You Have Any Special Interests In School?:  (uta) Did You Have An Individualized Education Program (IIEP):  Rich Reining) Did You Have Any Difficulty At School?:  Rich Reining) Patient's Education Has Been Impacted by Current Illness:  (uta)   CCA Family/Childhood History  Family and Relationship History: Family history Marital status: Married Number of Years Married:   (uta) What types of issues is patient dealing with in the relationship?:  (uta) Additional relationship information:  (uta) Are you sexually active?:  (uta) What is your sexual orientation?:  (uta) Has your sexual activity been affected by drugs, alcohol, medication, or emotional stress?:  (uta) Does patient have children?: Yes How many children?: 1 How is patient's relationship with their children?:  (uta)  Childhood History:  Childhood History By whom was/is the patient raised?:  (uta) Additional childhood history information:  (uta) Description of patient's relationship with caregiver when they were a child:  (uta) Patient's description of current relationship with people who raised him/her:  (uta) How were you disciplined when you got in trouble as a child/adolescent?:  (uta) Does patient have siblings?:  (uta) Did patient suffer any verbal/emotional/physical/sexual abuse as a child?:  (uta) Did patient suffer from severe childhood neglect?:  (uta) Has patient ever been sexually abused/assaulted/raped as an adolescent or adult?:  (uta) Was the patient ever a victim of a crime or a disaster?: No Witnessed domestic violence?:  (uta) Has patient been affected by domestic violence as an adult?:  Industrial/product designer)  Child/Adolescent Assessment:     CCA Substance Use  Alcohol/Drug Use: Alcohol / Drug Use Pain Medications: see MAR Prescriptions: see  MAR Over the Counter: see MAR History of alcohol / drug use?: Yes Longest period of sobriety (when/how long): uta Negative Consequences of Use: Legal Withdrawal Symptoms:  (none reported) Substance #1 Name of Substance 1: estacy 1 - Age of First Use: uta 1 - Amount (size/oz): "5 pills" 1 - Frequency: "normally 1 pill" 1 - Last Use / Amount: "yesterday I took 5 pills"                       ASAM's:  Six Dimensions of Multidimensional Assessment  Dimension 1:  Acute Intoxication and/or Withdrawal Potential:      Dimension 2:   Biomedical Conditions and Complications:      Dimension 3:  Emotional, Behavioral, or Cognitive Conditions and Complications:     Dimension 4:  Readiness to Change:     Dimension 5:  Relapse, Continued use, or Continued Problem Potential:     Dimension 6:  Recovery/Living Environment:     ASAM Severity Score:    ASAM Recommended Level of Treatment:     Substance use Disorder (SUD)    Recommendations for Services/Supports/Treatments: Recommendations for Services/Supports/Treatments Recommendations For Services/Supports/Treatments: ACCTT (Assertive Community Treatment) (follow up with ACT team.)  DSM5 Diagnoses: Patient Active Problem List   Diagnosis Date Noted  . Polysubstance abuse (HCC)   . Hyperkalemia   . Hypocalcemia   . 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 Suicidal ideation 10/06/2019  . Bipolar disorder (HCC) 10/06/2019  . Bipolar 1 disorder, depressed, severe (HCC) 10/05/2019  . MDD (major depressive disorder) 09/28/2019  . Anxiety 07/10/2019  . Hyponatremia 07/10/2019  . 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  . AKI (acute kidney injury) (HCC) 09/15/2018  . URI (upper respiratory infection) 09/15/2018  . Adjustment disorder with mixed disturbance of emotions and conduct   . Atypical chest pain 08/19/2018  . Borderline personality disorder (HCC) 02/26/2018  . 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 DKA, type 1 (HCC) 07/16/2017  . Nausea & vomiting 07/16/2017  . Uncontrolled type 1 diabetes circulatory disorder erectile dysfunction (HCC)   . DKA (diabetic ketoacidoses) (HCC) 12/18/2016  . History of seizures 12/01/2016  . History of migraine 12/01/2016  . Disordered eating 03/08/2015  . Ketonuria   . Adjustment reaction to medical therapy   . Non compliance w medication regimen   . 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 Dehydration 08/22/2014  . Hyperglycemia due to type 1 diabetes mellitus (HCC) 08/21/2014  . Type 1 diabetes mellitus with hyperglycemia (HCC)   . Noncompliance   . Generalized abdominal pain   . Glycosuria   . Depression 07/12/2014  . Involuntary movements 06/13/2014  . Bilateral leg pain 06/13/2014  . 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 Suicidal ideations 03/31/2014  . ODD (oppositional defiant disorder) 03/03/2014  . MDD (major depressive disorder), recurrent episode, moderate (HCC) 02/16/2014  . Attention deficit hyperactivity disorder (ADHD), combined type, moderate 02/16/2014  . Microcytic anemia 02/15/2014  . Hyperglycemia 02/14/2014  . Goiter 02/04/2014  . 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 Acquired acanthosis nigricans 02/04/2014  . Obesity, morbid (HCC) 02/04/2014  . Insulin resistance 02/04/2014  . Hyperinsulinemia 02/04/2014  . Hypoglycemia associated with diabetes (HCC) 02/02/2014  . Maladaptive health behaviors affecting medical condition 02/02/2014  . Hypoglycemia 02/02/2014  .  Type 1 diabetes mellitus in patient 13 to 21 years of age with hemoglobin A1c goal of less than 7.5% (HCC) 01/26/2014  . 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  . 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 Migraine without aura 12/13/2013  . Body mass index, pediatric, greater than or equal to 95th percentile for age 27/16/2015  . Asthma 12/13/2013  . Hypoglycemia unawareness in type 1 diabetes mellitus (HCC) 08/08/2013  . Epilepsy (HCC) 07/06/2013  . Short-term memory loss 07/06/2013  . Sickle cell trait (HCC) 07/06/2013  . Diabetes (HCC) 06/17/2013  . Diabetes mellitus, new onset (HCC) 06/17/2013     Referrals to Alternative Service(s): Referred to Alternative  Service(s):   Place:   Date:   Time:    Referred to Alternative Service(s):   Place:   Date:   Time:    Referred to Alternative Service(s):   Place:   Date:   Time:    Referred to Alternative Service(s):   Place:   Date:   Time:     Daylene Posey AlstonComprehensive Clinical Assessment (CCA) Screening, Triage and Referral Note  01/16/2020 [name] [name] [id number]  Visit Diagnosis:    ICD-10-CM   1. 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 Other (Comment) (ACT TEAM)   Referral name: No data recorded  Referral phone number: No data recorded Whom do you see for routine medical problems? Primary Care   Practice/Facility Name: [facility]   Practice/Facility Phone Number: No data recorded  Name of Contact: No data recorded  Contact Number: No data recorded  Contact Fax Number: No data recorded  Prescriber Name: No data recorded  Prescriber Address (if known): No data recorded What Is the Reason for Your Visit/Call Today? No data recorded How Long Has This Been Causing You Problems? <Week  Have You Recently Been in Any Inpatient Treatment (Hospital/Detox/Crisis Center/28-Day Program)? No   Name/Location of Program/Hospital:No data recorded  How Long Were You There? No data recorded  When Were You Discharged? No data recorded Have You Ever Received Services From [facility] Before? Yes   [facility]  Have You Recently Had Any Thoughts About Hurting Yourself? Yes   Are You Planning to Commit Suicide/Harm Yourself At This time?  No  Have you Recently Had Thoughts About Hurting Someone [name]? No   Explanation: No data recorded Have You Used Any Alcohol or Drugs in the Past 24 Hours? Yes   How Long Ago Did You Use Drugs or Alcohol?  1600   What Did You Use and How Much? "5 estacy pills"  What Do You Feel Would Help You the Most Today? Therapy  Do You Currently  Have a Therapist/Psychiatrist? Yes   Name of Therapist/Psychiatrist: [facility]   Have You Been Recently Discharged From Any Office Practice or Programs? No   Explanation of Discharge From Practice/Program:  No data recorded    CCA Screening Triage Referral Assessment Type of Contact: Face-to-Face   Is this Initial or Reassessment? No data recorded  Date Telepsych consult ordered in CHL:  No data recorded  Time Telepsych consult ordered in CHL:  No data recorded Patient Reported Information Reviewed? Yes   Patient Left Without Being Seen? No data recorded  Reason for Not Completing Assessment: No data recorded Collateral Involvement: none reported  Does Patient Have a Court Appointed Legal Guardian? No data recorded  Name and Contact of Legal Guardian:  Self.   If Minor and Not Living with Parent(s), Who has Custody? No data recorded Is CPS involved or ever been involved? Never  Is APS involved or ever been involved? Never  Patient Determined To Be At Risk for Harm To Self or Others Based on Review of Patient Reported Information or Presenting Complaint? No   Method: No data recorded  Availability of Means: No data recorded  Intent: No data recorded  Notification Required: No data recorded  Additional Information for Danger to Others Potential:  No data recorded  Additional Comments for Danger to Others Potential:  No data recorded  Are There Guns or Other Weapons in Your Home?  No data recorded   Types of Guns/Weapons: No data recorded   Are These Weapons Safely Secured?                              No data recorded   Who Could Verify You Are Able To Have These Secured:    No data recorded Do You Have any Outstanding Charges, Pending Court Dates, Parole/Probation? No data recorded Contacted To Inform of Risk of Harm To Self or Others: No data recorded Location of Assessment: [facility]  Does Patient Present under Involuntary Commitment? No   IVC Papers  Initial File Date: No data recorded  [place] of Residence: [place]  Patient Currently Receiving the Following Services: ACTT Psychologist, educational)   Determination of Need: Routine (7 days) (Patient will follow up with his ACT Team on tomorrow.)   Options For Referral: No data recorded  Burnetta Sabin, [facility]

## 2020-01-16 NOTE — ED Notes (Signed)
Pt. Pulse, BP are high RN, TTS notfied

## 2020-01-16 NOTE — Progress Notes (Signed)
eLink Physician-Brief Progress Note Patient Name: [name] [name]. DOB: 20-Nov-2000 MRN: [id number]   Date of Service  01/16/2020  HPI/Events of Note  Notified that patient is asking to leave. Patient seen walking around in room stating that his wife and child needs him at home and that he is being held against his will. He states that he has the right to sign himself out.  eICU Interventions  I informed patient that he can go into cardiac arrest given that his K was 7.5 and that he is aware of that.  Bedside CCM team is made aware and will come to bedside to evaluate competency,     Intervention Category Minor Interventions: Agitation / anxiety - evaluation and management  Darl Pikes 01/16/2020, 3:22 AM

## 2020-01-16 NOTE — ED Provider Notes (Signed)
Behavioral Health Medical Screening Exam  [name] [name] [name] [name]. is a 19 y.o. 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 He denies suicidal ideations and homicidal ideations. States that he had auditory hallucinations yesterday after using ecstasy. Unable to describe the voices. Denies current AVH. No indication that he is responding to internal stimuli.   Patient was admitted to [facility] on 01/15/2020 with severe DKA. He left AMA around 3 am on 01/16/2020. Patient's CBG is currently 312, which is a significant improvement from 01/15/2020. He states that he has taken his insulin today. Discussed returning to the Emergency Department for treatment/medical clearance. Patient states that he does not want to go to the emergency department. Discussed the risks of not receiving proper treatment for DKA. Patient states that he would rather go home. States that his ACT Team will likely not pick him up, so he will walk home. States that he lives nearby.   Progress Note form Hospital Admission 01/16/2020:  PCCM Interval Progress Note Called to assess pt who was wanting to leave AMA. Attempted to re-direct pt several times as nursing had already done.  Informed him of his severe electrolyte abnormalities and severe DKA and the risk of death if untreated. Pt not interested in discussing anything, states we are violating his rights.  He had already changed his clothes and per report, had threatening behavior towards nursing. He understands all risk including death.  AMA paperwork signed.  Total Time spent with patient: 20 minutes  Psychiatric Specialty Exam  Presentation  General Appearance:Appropriate for Environment;Casual;Well Groomed  Eye Contact:Good  Speech:Clear and Coherent;Normal Rate  Speech Volume:Decreased  Handedness:No data recorded  Mood and Affect  Mood:Dysphoric  Affect:Depressed   Thought Process  Thought  Processes:Coherent;Linear;Goal Directed  Descriptions of Associations:Intact  Orientation:Full (Time, Place and Person)  Thought Content:Logical  Hallucinations:No data recorded Ideas of Reference:No data recorded Suicidal Thoughts:No  Homicidal Thoughts:No   Sensorium  Memory:Immediate Good;Recent Good;Remote Good  Judgment:Fair  Insight:Fair   Executive Functions  Concentration:Fair  Attention Span:Fair  Recall:Good  Fund of Knowledge:Good  Language:Good   Psychomotor Activity  Psychomotor Activity:Normal   Assets  Assets:Communication Skills;Desire for Improvement;Financial Resources/Insurance;Housing;Leisure Time;Resilience   Sleep  Sleep:Fair  Number of hours: No data recorded  Physical Exam: Physical Exam Vitals reviewed.  Constitutional:      General: He is not in acute distress.    Appearance: He is ill-appearing. He is not toxic-appearing or diaphoretic.  HENT:     Head: Normocephalic.     Right Ear: External ear normal.     Left Ear: External ear normal.  Eyes:     Pupils: Pupils are equal, round, and reactive to light.  Cardiovascular:     Rate and Rhythm: Tachycardia present.  Pulmonary:     Effort: Pulmonary effort is normal. No respiratory distress.  Musculoskeletal:        General: Normal range of motion.     Comments: Reports back pain from fall yesterday. He was evaluated in the emergency department.  Neurological:     Mental Status: He is alert and oriented to person, place, and time.  Psychiatric:        Mood and Affect: Mood is anxious and depressed.        Thought Content: Thought content is not paranoid or delusional. Thought content does not include homicidal or suicidal ideation.    Review of Systems  Constitutional: Positive for malaise/fatigue. Negative for chills, diaphoresis, fever and weight  loss.  Respiratory: Negative for cough and shortness of breath.   Cardiovascular: Negative for chest pain.   Gastrointestinal: Negative for diarrhea, nausea and vomiting.  Musculoskeletal: Positive for back pain.  Psychiatric/Behavioral: Positive for depression, hallucinations and substance abuse. Negative for memory loss and suicidal ideas. The patient is nervous/anxious and has insomnia.    Blood pressure (!) 140/95, pulse (!) 120, temperature 97.8 F (36.6 C), temperature source Tympanic, resp. rate 20, height 5\' 10"  (1.778 m), weight 97.5 kg, SpO2 99 %. Body mass index is 30.85 kg/m.  Musculoskeletal: Strength & Muscle Tone: within normal limits Gait & Station: normal Patient leans: N/A   Recommendations:  Based on my evaluation the patient does not appear to have an emergency medical condition.   Disposition: No evidence of imminent risk to self or others at present.   Patient does not meet criteria for psychiatric inpatient admission. Supportive therapy provided about ongoing stressors. Discussed crisis plan, support from social network, calling 911, coming to the Emergency Department, and calling Suicide Hotline.   , NP 01/16/2020, 8:52 PM

## 2020-01-16 NOTE — Progress Notes (Signed)
eLink Physician-Brief Progress Note Patient Name: [name] [name]. DOB: [birth year] MRN: [id number]   Date of Service  01/16/2020  HPI/Events of Note  18 M Type 1 DM, polysubstance abuse. Multiple admissions for DKA presents after a fall in the shower. No reported loss of consciousness. Noted to be in DKA with bicarb <7, K 7.5, glucose > 1000  eICU Interventions  Started on bicarb drip. Fluids and insulin as per DKA protocol.     Intervention Category Major Interventions: Acid-Base disturbance - evaluation and management;Electrolyte abnormality - evaluation and management;Hyperglycemia - active titration of insulin therapy Evaluation Type: New Patient Evaluation  Darl Pikes 01/16/2020, 1:44 AM

## 2020-01-16 NOTE — Progress Notes (Signed)
PCCM Interval Progress Note  Called to assess pt who was wanting to leave AMA.  Attempted to re-direct pt several times as nursing had already done.  Informed him of his severe electrolyte abnormalities and severe DKA and the risk of death if untreated.  Pt not interested in discussing anything, states we are violating his rights.  He had already changed his clothes and per report, had threatening behavior towards nursing.  He understands all risk including death.  AMA paperwork signed.   Rutherford Guys, [place] [facility] 01/16/2020, 3:33 AM

## 2020-01-16 NOTE — ED Notes (Signed)
Tech stays with pt in the assessment room waiting for pt to be seen by TTS

## 2020-01-16 NOTE — ED Notes (Signed)
Pt. phone stored in locker # 13

## 2020-01-16 NOTE — ED Notes (Signed)
Patient complained of back pain

## 2020-01-16 NOTE — Discharge Instructions (Signed)
Please go to the emergency department to address your medical issues.   Return to [facility] is your mental health continues to worsen

## 2020-01-28 ENCOUNTER — Emergency Department (HOSPITAL_COMMUNITY)
Admission: EM | Admit: 2020-01-28 | Discharge: 2020-01-29 | Disposition: A | Discharge status: Home / Self Care | Payer: Medicaid Other | Attending: Emergency Medicine | Admitting: Emergency Medicine

## 2020-01-28 ENCOUNTER — Encounter (HOSPITAL_COMMUNITY): Payer: Self-pay

## 2020-01-28 DIAGNOSIS — F29 Unspecified psychosis not due to a substance or known physiological condition: Secondary | ICD-10-CM | POA: Diagnosis present

## 2020-01-28 DIAGNOSIS — J45909 Unspecified asthma, uncomplicated: Secondary | ICD-10-CM | POA: Insufficient documentation

## 2020-01-28 DIAGNOSIS — F250 Schizoaffective disorder, bipolar type: Secondary | ICD-10-CM | POA: Diagnosis not present

## 2020-01-28 DIAGNOSIS — F17210 Nicotine dependence, cigarettes, uncomplicated: Secondary | ICD-10-CM | POA: Insufficient documentation

## 2020-01-28 DIAGNOSIS — E1065 Type 1 diabetes mellitus with hyperglycemia: Secondary | ICD-10-CM | POA: Insufficient documentation

## 2020-01-28 DIAGNOSIS — R259 Unspecified abnormal involuntary movements: Secondary | ICD-10-CM | POA: Diagnosis not present

## 2020-01-28 DIAGNOSIS — Z794 Long term (current) use of insulin: Secondary | ICD-10-CM | POA: Diagnosis not present

## 2020-01-28 DIAGNOSIS — Z20822 Contact with and (suspected) exposure to covid-19: Secondary | ICD-10-CM | POA: Insufficient documentation

## 2020-01-28 DIAGNOSIS — F332 Major depressive disorder, recurrent severe without psychotic features: Secondary | ICD-10-CM | POA: Insufficient documentation

## 2020-01-28 DIAGNOSIS — Z046 Encounter for general psychiatric examination, requested by authority: Secondary | ICD-10-CM

## 2020-01-28 LAB — CBC
HCT: 44.3 % (ref 39.0–52.0)
Hemoglobin: 14.7 g/dL (ref 13.0–17.0)
MCH: 27.4 pg (ref 26.0–34.0)
MCHC: 33.2 g/dL (ref 30.0–36.0)
MCV: 82.6 fL (ref 80.0–100.0)
Platelets: 301 10*3/uL (ref 150–400)
RBC: 5.36 MIL/uL (ref 4.22–5.81)
RDW: 12.1 % (ref 11.5–15.5)
WBC: 4.8 10*3/uL (ref 4.0–10.5)
nRBC: 0 % (ref 0.0–0.2)

## 2020-01-28 LAB — COMPREHENSIVE METABOLIC PANEL
ALT: 50 U/L — ABNORMAL HIGH (ref 0–44)
AST: 39 U/L (ref 15–41)
Albumin: 3.5 g/dL (ref 3.5–5.0)
Alkaline Phosphatase: 110 U/L (ref 38–126)
Anion gap: 12 (ref 5–15)
BUN: 13 mg/dL (ref 6–20)
CO2: 24 mmol/L (ref 22–32)
Calcium: 9.1 mg/dL (ref 8.9–10.3)
Chloride: 99 mmol/L (ref 98–111)
Creatinine, Ser: 0.61 mg/dL (ref 0.61–1.24)
GFR calc Af Amer: >60 mL/min (ref >60)
GFR calc non Af Amer: >60 mL/min (ref >60)
Glucose, Bld: 286 mg/dL — ABNORMAL HIGH (ref 70–99)
Potassium: 3.7 mmol/L (ref 3.5–5.1)
Sodium: 135 mmol/L (ref 135–145)
Total Bilirubin: 0.9 mg/dL (ref 0.3–1.2)
Total Protein: 6.4 g/dL — ABNORMAL LOW (ref 6.5–8.1)

## 2020-01-28 LAB — ETHANOL: Alcohol, Ethyl (B): <10 mg/dL (ref <10)

## 2020-01-28 LAB — SALICYLATE LEVEL: Salicylate Lvl: <7 mg/dL — ABNORMAL LOW (ref 7.0–30.0)

## 2020-01-28 LAB — CBG MONITORING, ED
Glucose-Capillary: 526 mg/dL (ref 70–99)
Glucose-Capillary: 508 mg/dL (ref 70–99)

## 2020-01-28 LAB — ACETAMINOPHEN LEVEL: Acetaminophen (Tylenol), Serum: <10 ug/mL — ABNORMAL LOW (ref 10–30)

## 2020-01-28 MED ORDER — ARIPIPRAZOLE 5 MG PO TABS
5.0000 mg | ORAL_TABLET | Freq: Every day | ORAL | Status: DC
  Administered 2020-01-28 – 2020-01-29 (×2): 5 mg via ORAL
  Filled 2020-01-28 (×3): qty 1

## 2020-01-28 MED ORDER — INSULIN ASPART 100 UNIT/ML FLEXPEN: 1.0000 [IU] | PEN_INJECTOR | Freq: Three times a day (TID) | SUBCUTANEOUS | Status: DC

## 2020-01-28 MED ORDER — ALBUTEROL SULFATE HFA 108 (90 BASE) MCG/ACT IN AERS: 1.0000 | INHALATION_SPRAY | RESPIRATORY_TRACT | Status: DC | PRN

## 2020-01-28 MED ORDER — INSULIN ASPART 100 UNIT/ML ~~LOC~~ SOLN
20.0000 [IU] | Freq: Once | SUBCUTANEOUS | Status: AC
  Administered 2020-01-29: 20 [IU] via SUBCUTANEOUS

## 2020-01-28 MED ORDER — FLUTICASONE PROPIONATE HFA 110 MCG/ACT IN AERO: 2.0000 | INHALATION_SPRAY | Freq: Two times a day (BID) | RESPIRATORY_TRACT | Status: DC

## 2020-01-28 MED ORDER — BUPROPION HCL ER (SR) 150 MG PO TB12
150.0000 mg | ORAL_TABLET | Freq: Every day | ORAL | Status: DC
  Administered 2020-01-28 – 2020-01-29 (×2): 150 mg via ORAL
  Filled 2020-01-28 (×3): qty 1

## 2020-01-28 MED ORDER — LURASIDONE HCL 20 MG PO TABS
20.0000 mg | ORAL_TABLET | Freq: Every day | ORAL | Status: DC
  Administered 2020-01-28 – 2020-01-29 (×2): 20 mg via ORAL
  Filled 2020-01-28 (×2): qty 1

## 2020-01-28 MED ORDER — HYDROXYZINE HCL 25 MG PO TABS
25.0000 mg | ORAL_TABLET | Freq: Once | ORAL | Status: AC
  Administered 2020-01-28: 25 mg via ORAL
  Filled 2020-01-28: qty 1

## 2020-01-28 MED ORDER — PRAZOSIN HCL 1 MG PO CAPS
1.0000 mg | ORAL_CAPSULE | Freq: Every day | ORAL | Status: DC
  Administered 2020-01-28 – 2020-01-29 (×2): 1 mg via ORAL
  Filled 2020-01-28 (×2): qty 1

## 2020-01-28 MED ORDER — GABAPENTIN 300 MG PO CAPS
300.0000 mg | ORAL_CAPSULE | Freq: Every day | ORAL | Status: DC
  Administered 2020-01-28 – 2020-01-29 (×2): 300 mg via ORAL
  Filled 2020-01-28 (×2): qty 1

## 2020-01-28 MED ORDER — INSULIN ASPART 100 UNIT/ML ~~LOC~~ SOLN
0.0000 [IU] | Freq: Three times a day (TID) | SUBCUTANEOUS | Status: DC
  Administered 2020-01-29: 3 [IU] via SUBCUTANEOUS
  Administered 2020-01-29: 11 [IU] via SUBCUTANEOUS

## 2020-01-28 MED ORDER — BUDESONIDE 0.25 MG/2ML IN SUSP
0.2500 mg | Freq: Two times a day (BID) | RESPIRATORY_TRACT | Status: DC
  Filled 2020-01-28: qty 2

## 2020-01-28 MED ORDER — FLUOXETINE HCL 20 MG PO CAPS
20.0000 mg | ORAL_CAPSULE | Freq: Every day | ORAL | Status: DC
  Administered 2020-01-28 – 2020-01-29 (×2): 20 mg via ORAL
  Filled 2020-01-28 (×2): qty 1

## 2020-01-28 MED ORDER — INSULIN GLARGINE 100 UNIT/ML ~~LOC~~ SOLN
30.0000 [IU] | Freq: Two times a day (BID) | SUBCUTANEOUS | Status: DC
  Administered 2020-01-29 (×2): 30 [IU] via SUBCUTANEOUS
  Filled 2020-01-28 (×5): qty 0.3

## 2020-01-28 NOTE — ED Triage Notes (Signed)
Pt bib gpd as IVC for aggressive behavior and possible access to a weapon. Pt denies SI/HI in triage.

## 2020-01-28 NOTE — ED Provider Notes (Addendum)
[facility] EMERGENCY DEPARTMENT Provider Note   CSN: [id number] Arrival date & time: 01/28/20  1144     History Chief Complaint  Patient presents with  . Psychiatric Evaluation    [name] [name]. is a 19 y.o. male.  HPI  19 year old male with a history of polysubstance abuse, DM type I, anxiety, asthma, epilepsy, presents to the ER via IVC.  Patient states that he does not know why he has been IVC, states that yesterday him and [name] [name] got into a fight and he called the crisis line.  He states that there was a poor connection, however he did not endorse suicidal ideation.  He states that this morning he woke up with the police going into [name] home and saying that he was IVC'd.  History is limited, however I reviewed the IVC paperwork which was taken out by Mr. As a Crumbley who was with the crisis line.  Per [name] notes, the patient had threatened to beat up [name] case manager, reported stealing a gun and wanting to get bullets because he was angry.  He states that he the patient has threatened office staff in the past.  Also reported auditory hallucinations when anxious.  I spoke with the patient's [name], who states that he was with her most of the night.  She did not endorse any arguments.  She was not aware that he had called the crisis line.  She states that he has overall been doing much better after going to rehab for [name] meth addiction.  Patient reports that he has not used drugs since the last time he was seen here in the ER in full-blown DKA several weeks ago.  Patient denies any suicidal/homicidal ideations.  Denies any auditory visual hallucinations.  Denies any recent drug or alcohol use.      Past Medical History:  Diagnosis Date  . ADD (attention deficit disorder)   . Allergy   . Anxiety   . Asthma   . Depression   . Epileptic seizure ([place])    "both petit and grand mal; last sz ~ 2 wk ago" (06/17/2013)  . Headache(784.0)    "2-3 times/wk usually"  (06/17/2013)  . Heart murmur    "heard a slight one earlier today" (06/17/2013)  . Homeless   . Migraine    "maybe once/month; it's severe" (06/17/2013)  . ODD (oppositional defiant disorder)   . Sickle cell trait ([place])   . Type I diabetes mellitus ([place])    "that's what they're thinking now" (06/17/2013)  . Vision abnormalities    "takes him longer to focus cause; from [name] sz" (06/17/2013)    Patient Active Problem List   Diagnosis Date Noted  . Polysubstance abuse ([place])   . Hyperkalemia   . Hypocalcemia   . 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 Suicidal ideation 10/06/2019  . Bipolar disorder ([place]) 10/06/2019  . Bipolar 1 disorder, depressed, severe ([place]) 10/05/2019  . MDD (major depressive disorder) 09/28/2019  . Anxiety 07/10/2019  . Hyponatremia 07/10/2019  . Methamphetamine intoxication ([place]) 10/26/2018  . AKI (acute kidney injury) ([place]) 09/15/2018  . URI (upper respiratory infection) 09/15/2018  . Adjustment disorder with mixed disturbance of emotions and conduct   . Atypical chest pain 08/19/2018  . Borderline personality disorder ([place]) 02/26/2018  . 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 Major depressive disorder, recurrent severe without psychotic features ([place]) 07/24/2017  . DKA, type 1 ([place]) 07/16/2017  .  Nausea & vomiting 07/16/2017  . Uncontrolled type 1 diabetes circulatory disorder erectile dysfunction ([place])   . DKA (diabetic ketoacidoses) ([place]) 12/18/2016  . History of seizures 12/01/2016  . History of migraine 12/01/2016  . Disordered eating 03/08/2015  . Ketonuria   . Adjustment reaction to medical therapy   . Non compliance w medication regimen   . Diabetic peripheral neuropathy associated with type 1 diabetes mellitus ([place]) 09/29/2014  . Dehydration 08/22/2014  . Hyperglycemia due to type 1 diabetes mellitus ([place]) 08/21/2014  . Type 1 diabetes mellitus with hyperglycemia  ([place])   . Noncompliance   . Generalized abdominal pain   . Glycosuria   . Depression 07/12/2014  . Involuntary movements 06/13/2014  . Bilateral leg pain 06/13/2014  . 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 Suicidal ideations 03/31/2014  . ODD (oppositional defiant disorder) 03/03/2014  . MDD (major depressive disorder), recurrent episode, moderate ([place]) 02/16/2014  . Attention deficit hyperactivity disorder (ADHD), combined type, moderate 02/16/2014  . Microcytic anemia 02/15/2014  . Hyperglycemia 02/14/2014  . Goiter 02/04/2014  . 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[place]) 02/04/2014  . Acquired acanthosis nigricans 02/04/2014  . Obesity, morbid ([facility]) 02/04/2014  . Insulin resistance 02/04/2014  . Hyperinsulinemia 02/04/2014  . Hypoglycemia associated with diabetes ([place]) 02/02/2014  . Maladaptive health behaviors affecting medical condition 02/02/2014  . Hypoglycemia 02/02/2014  . Type 1 diabetes mellitus in patient 89 to 21 years of age with hemoglobin A1c goal of less than 7.5% ([place]) 01/26/2014  . Partial epilepsy with impairment of consciousness ([place]) 12/13/2013  . 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 Migraine without aura 12/13/2013  . Body mass index, pediatric, greater than or equal to 95th percentile for age 55/16/2015  . Asthma 12/13/2013  . Hypoglycemia unawareness in type 1 diabetes mellitus ([place]) 08/08/2013  . Epilepsy ([place]) 07/06/2013  . Short-term memory loss 07/06/2013  . Sickle cell trait ([place]) 07/06/2013  . Diabetes ([place]) 06/17/2013  . Diabetes mellitus, new onset ([place]) 06/17/2013    Past Surgical History:  Procedure Laterality Date  . CIRCUMCISION  2000  . FINGER SURGERY Left 2001   "crushed pinky; had to repair it" (06/17/2013)       Family History  Problem Relation Age of Onset  . Asthma Mother   . COPD Mother   . Other Mother        possible  autoimmune, unclear  . Diabetes Maternal Grandmother   . Heart disease Maternal Grandmother   . Hypertension Maternal Grandmother   . Mental illness Maternal Grandmother   . Heart disease Maternal Grandfather   . Hyperlipidemia Paternal Grandmother   . Hyperlipidemia Paternal Grandfather   . Migraines Sister        Hemiplegic Migraines     Social History   Tobacco Use  . Smoking status: Current Every Day Smoker    Packs/day: 0.50    Types: Cigarettes  . Smokeless tobacco: Never Used  . Tobacco comment: Mom and dad smoke outside   Vaping Use  . Vaping Use: Former  Substance Use Topics  . Alcohol use: No    Alcohol/week: 0.0 standard drinks  . Drug use: Yes    Frequency: 7.0 times per week    Types: Marijuana, Methamphetamines    Home Medications Prior to Admission medications   Medication Sig Start Date End Date Taking? Authorizing Provider  albuterol (PROVENTIL HFA;VENTOLIN HFA) 108 (90 Base) MCG/ACT inhaler Inhale 1 puff into the lungs  every 4 (four) hours as needed for wheezing or shortness of breath. 09/16/18   Ghimire, Henreitta Leber, MD  ARIPiprazole (ABILIFY) 5 MG tablet Take 5 mg by mouth daily. 09/27/19   [provider]  blood glucose meter kit and supplies KIT Dispense based on patient and insurance preference. Use up to four times daily as directed. (FOR ICD-9 250.00, 250.01). 04/22/19   Robinson, [place] N, PA-C  buPROPion (WELLBUTRIN SR) 150 MG 12 hr tablet Take 150 mg by mouth daily. 09/27/19   [provider]  clotrimazole (LOTRIMIN) 1 % cream Apply to affected area 2 times daily 09/18/19   Wieters, [employer], PA-C  FLOVENT HFA 110 MCG/ACT inhaler Inhale 2 puffs into the lungs in the morning and at bedtime. 10/12/19   [provider]  FLUoxetine (PROZAC) 20 MG capsule Take 1 capsule (20 mg total) by mouth daily. Patient not taking: Reported on 10/06/2019 09/20/18   Patrecia Pour, NP  gabapentin (NEURONTIN) 300 MG capsule Take 300 mg by mouth  daily.  01/11/20   [provider]  glucose blood (FREESTYLE TEST STRIPS) test strip Use as instructed 09/16/18   Jonetta Osgood, MD  hydrOXYzine (ATARAX/VISTARIL) 25 MG tablet Take 25 mg by mouth daily.  12/22/19   [provider]  insulin aspart (NOVOLOG FLEXPEN) 100 UNIT/ML FlexPen Inject 1-15 Units into the skin 3 (three) times daily with meals. 1 unit/g of carbohydrate sliding scale. 07/10/19   Caren Griffins, MD  Insulin Glargine (LANTUS) 100 UNIT/ML Solostar Pen Inject 30 Units into the skin 2 (two) times daily. 07/10/19   Gherghe, Vella Redhead, MD  LATUDA 20 MG TABS tablet Take 20 mg by mouth daily.  01/11/20   [provider]  MINIPRESS 1 MG capsule Take 1 mg by mouth daily.  01/11/20   [provider]    Allergies    Bee venom, Fish allergy, Shellfish allergy, and Tea  Review of Systems   Review of Systems  Constitutional: Negative for chills and fever.  HENT: Negative for ear pain and sore throat.   Eyes: Negative for pain and visual disturbance.  Respiratory: Negative for cough and shortness of breath.   Cardiovascular: Negative for chest pain and palpitations.  Gastrointestinal: Negative for abdominal pain and vomiting.  Genitourinary: Negative for dysuria and hematuria.  Musculoskeletal: Negative for arthralgias and back pain.  Skin: Negative for color change and rash.  Neurological: Negative for seizures and syncope.  Psychiatric/Behavioral: Negative for confusion, hallucinations, self-injury, sleep disturbance and suicidal ideas. The patient is not nervous/anxious.   All other systems reviewed and are negative.   Physical Exam Updated Vital Signs BP (!) 158/99 (BP Location: Left Arm)   Pulse 93   Temp 98.4 F (36.9 C) (Oral)   Resp 17   SpO2 100%   Physical Exam Vitals and nursing note reviewed.  Constitutional:      General: He is not in acute distress.    Appearance: Normal appearance. He is well-developed. He is not  ill-appearing, toxic-appearing or diaphoretic.  HENT:     Head: Normocephalic and atraumatic.  Eyes:     Conjunctiva/sclera: Conjunctivae normal.  Cardiovascular:     Rate and Rhythm: Normal rate and regular rhythm.     Heart sounds: No murmur heard.   Pulmonary:     Effort: Pulmonary effort is normal. No respiratory distress.     Breath sounds: Normal breath sounds.  Abdominal:     Palpations: Abdomen is soft.  Tenderness: There is no abdominal tenderness.  Musculoskeletal:        General: No tenderness or deformity. Normal range of motion.     Cervical back: Neck supple.  Skin:    General: Skin is warm and dry.  Neurological:     General: No focal deficit present.     Mental Status: He is alert and oriented to person, place, and time.  Psychiatric:        Attention and Perception: Attention and perception normal.        Mood and Affect: Mood and affect normal.        Speech: Speech normal.        Behavior: Behavior normal. Behavior is cooperative.        Thought Content: Thought content normal.        Cognition and Memory: Cognition normal.        Judgment: Judgment normal.     ED Results / Procedures / Treatments   Labs (all labs ordered are listed, but only abnormal results are displayed) Labs Reviewed  COMPREHENSIVE METABOLIC PANEL - Abnormal; Notable for the following components:      Result Value   Glucose, Bld 286 (*)    Total Protein 6.4 (*)    ALT 50 (*)    All other components within normal limits  SALICYLATE LEVEL - Abnormal; Notable for the following components:   Salicylate Lvl <6.2 (*)    All other components within normal limits  ACETAMINOPHEN LEVEL - Abnormal; Notable for the following components:   Acetaminophen (Tylenol), Serum <10 (*)    All other components within normal limits  CBG MONITORING, ED - Abnormal; Notable for the following components:   Glucose-Capillary 526 (*)    All other components within normal limits  CBG MONITORING, ED -  Abnormal; Notable for the following components:   Glucose-Capillary 508 (*)    All other components within normal limits  ETHANOL  CBC  RAPID URINE DRUG SCREEN, HOSP PERFORMED  URINALYSIS, ROUTINE W REFLEX MICROSCOPIC  CBG MONITORING, ED    EKG None  Radiology No results found.  Procedures Procedures (including critical care time)  Medications Ordered in ED Medications  albuterol (VENTOLIN HFA) 108 (90 Base) MCG/ACT inhaler 1 puff (has no administration in time range)  ARIPiprazole (ABILIFY) tablet 5 mg (5 mg Oral Given 01/28/20 2145)  buPROPion (WELLBUTRIN SR) 12 hr tablet 150 mg (150 mg Oral Given 01/28/20 2145)  FLUoxetine (PROZAC) capsule 20 mg (20 mg Oral Given 01/28/20 2145)  gabapentin (NEURONTIN) capsule 300 mg (300 mg Oral Given 01/28/20 2145)  insulin glargine (LANTUS) injection 30 Units (has no administration in time range)  lurasidone (LATUDA) tablet 20 mg (20 mg Oral Given 01/28/20 2145)  prazosin (MINIPRESS) capsule 1 mg (1 mg Oral Given 01/28/20 2145)  budesonide (PULMICORT) nebulizer solution 0.25 mg (has no administration in time range)  insulin aspart (novoLOG) injection 0-15 Units (has no administration in time range)  insulin aspart (novoLOG) injection 20 Units (has no administration in time range)  hydrOXYzine (ATARAX/VISTARIL) tablet 25 mg (25 mg Oral Given 01/28/20 2100)    ED Course  I have reviewed the triage vital signs and the nursing notes.  Pertinent labs & imaging results that were available during my care of the patient were reviewed by me and considered in my medical decision making (see chart for details).    MDM Rules/Calculators/A&P  19 year old male under IVC taken out by the crisis line case manager On presentation, the patient is alert, oriented, nontoxic-appearing, no acute distress.  He is not anxious appearing, able to give a concise history.  He states he does not know what he has been IVCD.  He states he feels as  though this has been a miscommunication between him and the case manager.  He denies any suicidal or homicidal ideations.  He denies any hallucinations.  Denies any drug or alcohol use since he has been to rehab.  Patient does have a history of DKA, presented here several weeks ago with severe metabolic derangements and admitted to the ICU.  CMP today without any significant electrolyte abnormalities, glucose of 286.  No anion gap.  Slightly elevated ALT at 50.  Negative Tylenol and salicylate.  Patient is slightly hypertensive here in the ED with a blood pressure 152/91.  Other vitals reassuring.  Pending UA and UDS.  Patient does not appear to be in DKA, overall medically stable for further evaluation by TTS.  10:15 PM: Patient was given dinner, however despite ordering [name] home insulin, was not given this by nursing staff. CBG noted to be 536. Patient denies any symptoms other than feeling slightly thirsty. Still awaiting UA, however still do not think that he is in DKA. Patient to receive insulin, will recheck.  10:49 PM: Received call from TTS, plan is to have psych evaluate the patient in the morning. Home meds reordered.   11:53PM: Patient was given Lantus, rechecked and blood sugar of 508.  Ordered 20 units of sliding scale insulin.  Will recheck in an hour. UA without ketones, >500 glucose. UDS pending.  Patient signed out to [facility]. She will oversee [name] management of [name] hyperglycemia. Suspect he will otherwise be medically stable for psych eval in the morning.   Final Clinical Impression(s) / ED Diagnoses Final diagnoses:  Involuntary commitment    Rx / DC Orders ED Discharge Orders    None         Garald Balding, PA-C 01/29/20 Honaker, [place], DO 01/29/20 1457
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
## 2020-01-29 ENCOUNTER — Other Ambulatory Visit: Payer: Self-pay

## 2020-01-29 DIAGNOSIS — F250 Schizoaffective disorder, bipolar type: Secondary | ICD-10-CM

## 2020-01-29 LAB — URINALYSIS, ROUTINE W REFLEX MICROSCOPIC
Bacteria, UA: NONE SEEN
Bilirubin Urine: NEGATIVE
Glucose, UA: >=500 mg/dL — AB
Hgb urine dipstick: NEGATIVE
Ketones, ur: NEGATIVE mg/dL
Leukocytes,Ua: NEGATIVE
Nitrite: NEGATIVE
Protein, ur: NEGATIVE mg/dL
Specific Gravity, Urine: 1.018 (ref 1.005–1.030)
pH: 7 (ref 5.0–8.0)

## 2020-01-29 LAB — SARS CORONAVIRUS 2 BY RT PCR (HOSPITAL ORDER, PERFORMED IN ~~LOC~~ HOSPITAL LAB): SARS Coronavirus 2: NEGATIVE

## 2020-01-29 LAB — CBG MONITORING, ED
Glucose-Capillary: 266 mg/dL — ABNORMAL HIGH (ref 70–99)
Glucose-Capillary: 161 mg/dL — ABNORMAL HIGH (ref 70–99)
Glucose-Capillary: 332 mg/dL — ABNORMAL HIGH (ref 70–99)

## 2020-01-29 LAB — RAPID URINE DRUG SCREEN, HOSP PERFORMED
Amphetamines: NOT DETECTED
Barbiturates: NOT DETECTED
Benzodiazepines: NOT DETECTED
Cocaine: NOT DETECTED
Opiates: NOT DETECTED
Tetrahydrocannabinol: NOT DETECTED

## 2020-01-29 NOTE — ED Notes (Signed)
Pt aware of tx plan - D/C to home. Pt calling spouse to advise.

## 2020-01-29 NOTE — ED Provider Notes (Signed)
Emergency Medicine Observation Re-evaluation Note  [name] [name]. is a 19 y.o. male, seen on rounds today.  Pt initially presented to the ED for complaints of Psychiatric Evaluation Currently, the patient is sleeping. Apparently the events on the IVC paperwork occurred last week. Staff note he has been calm and cooperative. He is awaiting re-evaluation this AM.  Physical Exam  BP (!) 122/89 (BP Location: Left Arm)   Pulse 89   Temp 97.8 F (36.6 C) (Oral)   Resp 18   Ht 5\' 10"  (1.778 m)   Wt 90.7 kg   SpO2 100%   BMI 28.70 kg/m  Physical Exam Vitals and nursing note reviewed.  Constitutional:      General: He is not in acute distress.    Appearance: He is well-developed.  HENT:     Head: Normocephalic and atraumatic.  Eyes:     General: No scleral icterus.       Right eye: No discharge.        Left eye: No discharge.     Conjunctiva/sclera: Conjunctivae normal.     Pupils: Pupils are equal, round, and reactive to light.  Cardiovascular:     Rate and Rhythm: Normal rate.  Pulmonary:     Effort: Pulmonary effort is normal. No respiratory distress.  Abdominal:     General: There is no distension.  Musculoskeletal:     Cervical back: Normal range of motion.  Skin:    General: Skin is warm and dry.  Neurological:     Mental Status: He is alert and oriented to person, place, and time.  Psychiatric:        Behavior: Behavior normal.     ED Course / MDM  EKG:    I have reviewed the labs performed to date as well as medications administered while in observation.  Recent changes in the last 24 hours include CBG which was 266 and 161 today Plan  Current plan is for re-eval by psych  Patient is under full IVC at this time.   , PA-C 01/29/20 1150    Tegeler, 03/30/20, MD 01/29/20 [phone]

## 2020-01-29 NOTE — ED Notes (Signed)
Pt states last conversation he had w/therapist was 1.5 weeks ago when he advised he was upset w/them because they had not delivered his meds. States he does not "understand why they just now IVC'd me". Pt noted to be calm, cooperative, pleasant. Pt noted to be wearing eyeglasses and burgundy scrubs. Pt noted w/multiple tattoos - above right eye, neck, both arms/hands. States he lives w/his spouse and 35-month-old son. 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

## 2020-01-29 NOTE — ED Notes (Signed)
Pt in shower.  

## 2020-01-29 NOTE — Discharge Instructions (Signed)
Please follow up with your mental health provider
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
## 2020-01-29 NOTE — ED Notes (Signed)
ALL belongings - 1 labeled belongings bag - returned to pt - Pt signed verifying all items present.

## 2020-01-29 NOTE — ED Notes (Signed)
Telepsych being performed. 

## 2020-01-29 NOTE — ED Notes (Signed)
IVC paperwork - 1st Exam completed by Dr Donnald Garre - Copy of IVC paperwork faxed to [facility] - Copy sent to Medical Records - Original placed in folder for Magistrate - ALL 3 sets on clipboard.

## 2020-01-29 NOTE — ED Notes (Signed)
IVC paperwork rescinded - Copy faxed to Clerk of Court - Copy sent to Medical Records - Original placed in folder for Clerk of Court.  

## 2020-02-17 ENCOUNTER — Other Ambulatory Visit: Payer: Self-pay

## 2020-02-17 ENCOUNTER — Encounter (HOSPITAL_COMMUNITY): Payer: Self-pay

## 2020-02-17 DIAGNOSIS — Z91013 Allergy to seafood: Secondary | ICD-10-CM | POA: Diagnosis not present

## 2020-02-17 DIAGNOSIS — F603 Borderline personality disorder: Secondary | ICD-10-CM | POA: Diagnosis not present

## 2020-02-17 DIAGNOSIS — F17210 Nicotine dependence, cigarettes, uncomplicated: Secondary | ICD-10-CM | POA: Diagnosis not present

## 2020-02-17 DIAGNOSIS — F988 Other specified behavioral and emotional disorders with onset usually occurring in childhood and adolescence: Secondary | ICD-10-CM | POA: Diagnosis present

## 2020-02-17 DIAGNOSIS — U071 COVID-19: Secondary | ICD-10-CM | POA: Diagnosis not present

## 2020-02-17 DIAGNOSIS — Z794 Long term (current) use of insulin: Secondary | ICD-10-CM | POA: Diagnosis not present

## 2020-02-17 DIAGNOSIS — Z91030 Bee allergy status: Secondary | ICD-10-CM | POA: Diagnosis not present

## 2020-02-17 DIAGNOSIS — R296 Repeated falls: Secondary | ICD-10-CM | POA: Diagnosis not present

## 2020-02-17 DIAGNOSIS — F319 Bipolar disorder, unspecified: Secondary | ICD-10-CM | POA: Diagnosis not present

## 2020-02-17 DIAGNOSIS — Z825 Family history of asthma and other chronic lower respiratory diseases: Secondary | ICD-10-CM

## 2020-02-17 DIAGNOSIS — Z83438 Family history of other disorder of lipoprotein metabolism and other lipidemia: Secondary | ICD-10-CM | POA: Diagnosis not present

## 2020-02-17 DIAGNOSIS — R079 Chest pain, unspecified: Secondary | ICD-10-CM | POA: Diagnosis present

## 2020-02-17 DIAGNOSIS — Z833 Family history of diabetes mellitus: Secondary | ICD-10-CM

## 2020-02-17 DIAGNOSIS — E1010 Type 1 diabetes mellitus with ketoacidosis without coma: Secondary | ICD-10-CM | POA: Diagnosis present

## 2020-02-17 DIAGNOSIS — Z8249 Family history of ischemic heart disease and other diseases of the circulatory system: Secondary | ICD-10-CM | POA: Diagnosis not present

## 2020-02-17 DIAGNOSIS — Z79899 Other long term (current) drug therapy: Secondary | ICD-10-CM

## 2020-02-17 LAB — CBG MONITORING, ED: Glucose-Capillary: 233 mg/dL — ABNORMAL HIGH (ref 70–99)

## 2020-02-17 NOTE — ED Triage Notes (Addendum)
Pt sts covid + 1 week ago. Has not eat in 3 days and blood sugar is elevated. States cbg monitor reads HIGH. C/o dizziness, tachycardia and has fallen down 3 times today. Took 12 units novolog pta. CBG 233 in triage

## 2020-02-18 ENCOUNTER — Emergency Department (HOSPITAL_COMMUNITY)
Admission: EM | Admit: 2020-02-18 | Discharge: 2020-02-19 | DRG: 637 | Discharge status: Against Medical Advice | Payer: Medicaid Other | Attending: Family Medicine | Admitting: Family Medicine

## 2020-02-18 ENCOUNTER — Emergency Department (HOSPITAL_COMMUNITY): Payer: Medicaid Other

## 2020-02-18 DIAGNOSIS — F603 Borderline personality disorder: Secondary | ICD-10-CM

## 2020-02-18 DIAGNOSIS — E109 Type 1 diabetes mellitus without complications: Secondary | ICD-10-CM | POA: Diagnosis present

## 2020-02-18 DIAGNOSIS — E1010 Type 1 diabetes mellitus with ketoacidosis without coma: Secondary | ICD-10-CM | POA: Diagnosis not present

## 2020-02-18 DIAGNOSIS — F319 Bipolar disorder, unspecified: Secondary | ICD-10-CM | POA: Diagnosis not present

## 2020-02-18 DIAGNOSIS — R0602 Shortness of breath: Secondary | ICD-10-CM

## 2020-02-18 LAB — BASIC METABOLIC PANEL
Anion gap: 17 — ABNORMAL HIGH (ref 5–15)
Anion gap: 10 (ref 5–15)
Anion gap: 12 (ref 5–15)
Anion gap: 11 (ref 5–15)
Anion gap: 10 (ref 5–15)
BUN: 8 mg/dL (ref 6–20)
BUN: 5 mg/dL — ABNORMAL LOW (ref 6–20)
BUN: <5 mg/dL — ABNORMAL LOW (ref 6–20)
BUN: 5 mg/dL — ABNORMAL LOW (ref 6–20)
BUN: <5 mg/dL — ABNORMAL LOW (ref 6–20)
CO2: 14 mmol/L — ABNORMAL LOW (ref 22–32)
CO2: 19 mmol/L — ABNORMAL LOW (ref 22–32)
CO2: 20 mmol/L — ABNORMAL LOW (ref 22–32)
CO2: 24 mmol/L (ref 22–32)
CO2: 25 mmol/L (ref 22–32)
Calcium: 7.8 mg/dL — ABNORMAL LOW (ref 8.9–10.3)
Calcium: 8.2 mg/dL — ABNORMAL LOW (ref 8.9–10.3)
Calcium: 8.1 mg/dL — ABNORMAL LOW (ref 8.9–10.3)
Calcium: 8.6 mg/dL — ABNORMAL LOW (ref 8.9–10.3)
Calcium: 8.3 mg/dL — ABNORMAL LOW (ref 8.9–10.3)
Chloride: 106 mmol/L (ref 98–111)
Chloride: 109 mmol/L (ref 98–111)
Chloride: 106 mmol/L (ref 98–111)
Chloride: 107 mmol/L (ref 98–111)
Chloride: 103 mmol/L (ref 98–111)
Creatinine, Ser: 1.01 mg/dL (ref 0.61–1.24)
Creatinine, Ser: 0.68 mg/dL (ref 0.61–1.24)
Creatinine, Ser: 0.64 mg/dL (ref 0.61–1.24)
Creatinine, Ser: 0.56 mg/dL — ABNORMAL LOW (ref 0.61–1.24)
Creatinine, Ser: 0.53 mg/dL — ABNORMAL LOW (ref 0.61–1.24)
GFR calc Af Amer: >60 mL/min (ref >60)
GFR calc Af Amer: >60 mL/min (ref >60)
GFR calc Af Amer: >60 mL/min (ref >60)
GFR calc Af Amer: >60 mL/min (ref >60)
GFR calc Af Amer: >60 mL/min (ref >60)
GFR calc non Af Amer: >60 mL/min (ref >60)
GFR calc non Af Amer: >60 mL/min (ref >60)
GFR calc non Af Amer: >60 mL/min (ref >60)
GFR calc non Af Amer: >60 mL/min (ref >60)
GFR calc non Af Amer: >60 mL/min (ref >60)
Glucose, Bld: 340 mg/dL — ABNORMAL HIGH (ref 70–99)
Glucose, Bld: 179 mg/dL — ABNORMAL HIGH (ref 70–99)
Glucose, Bld: 244 mg/dL — ABNORMAL HIGH (ref 70–99)
Glucose, Bld: 145 mg/dL — ABNORMAL HIGH (ref 70–99)
Glucose, Bld: 213 mg/dL — ABNORMAL HIGH (ref 70–99)
Potassium: 4.6 mmol/L (ref 3.5–5.1)
Potassium: 4 mmol/L (ref 3.5–5.1)
Potassium: 3.8 mmol/L (ref 3.5–5.1)
Potassium: 3.6 mmol/L (ref 3.5–5.1)
Potassium: 3.6 mmol/L (ref 3.5–5.1)
Sodium: 137 mmol/L (ref 135–145)
Sodium: 138 mmol/L (ref 135–145)
Sodium: 138 mmol/L (ref 135–145)
Sodium: 142 mmol/L (ref 135–145)
Sodium: 138 mmol/L (ref 135–145)

## 2020-02-18 LAB — URINALYSIS, ROUTINE W REFLEX MICROSCOPIC
Bacteria, UA: NONE SEEN
Bilirubin Urine: NEGATIVE
Glucose, UA: >=500 mg/dL — AB
Hgb urine dipstick: NEGATIVE
Ketones, ur: 80 mg/dL — AB
Leukocytes,Ua: NEGATIVE
Nitrite: NEGATIVE
Protein, ur: NEGATIVE mg/dL
Specific Gravity, Urine: 1.018 (ref 1.005–1.030)
pH: 5 (ref 5.0–8.0)

## 2020-02-18 LAB — CBG MONITORING, ED
Glucose-Capillary: 123 mg/dL — ABNORMAL HIGH (ref 70–99)
Glucose-Capillary: 135 mg/dL — ABNORMAL HIGH (ref 70–99)
Glucose-Capillary: 141 mg/dL — ABNORMAL HIGH (ref 70–99)
Glucose-Capillary: 156 mg/dL — ABNORMAL HIGH (ref 70–99)
Glucose-Capillary: 161 mg/dL — ABNORMAL HIGH (ref 70–99)
Glucose-Capillary: 169 mg/dL — ABNORMAL HIGH (ref 70–99)
Glucose-Capillary: 178 mg/dL — ABNORMAL HIGH (ref 70–99)
Glucose-Capillary: 178 mg/dL — ABNORMAL HIGH (ref 70–99)
Glucose-Capillary: 190 mg/dL — ABNORMAL HIGH (ref 70–99)
Glucose-Capillary: 206 mg/dL — ABNORMAL HIGH (ref 70–99)
Glucose-Capillary: 209 mg/dL — ABNORMAL HIGH (ref 70–99)
Glucose-Capillary: 223 mg/dL — ABNORMAL HIGH (ref 70–99)
Glucose-Capillary: 237 mg/dL — ABNORMAL HIGH (ref 70–99)
Glucose-Capillary: 240 mg/dL — ABNORMAL HIGH (ref 70–99)
Glucose-Capillary: 240 mg/dL — ABNORMAL HIGH (ref 70–99)
Glucose-Capillary: 248 mg/dL — ABNORMAL HIGH (ref 70–99)
Glucose-Capillary: 261 mg/dL — ABNORMAL HIGH (ref 70–99)
Glucose-Capillary: 312 mg/dL — ABNORMAL HIGH (ref 70–99)
Glucose-Capillary: 320 mg/dL — ABNORMAL HIGH (ref 70–99)

## 2020-02-18 LAB — CBC WITH DIFFERENTIAL/PLATELET
Abs Immature Granulocytes: 0.32 10*3/uL — ABNORMAL HIGH (ref 0.00–0.07)
Basophils Absolute: 0.1 10*3/uL (ref 0.0–0.1)
Basophils Relative: 1 %
Eosinophils Absolute: 0 10*3/uL (ref 0.0–0.5)
Eosinophils Relative: 0 %
HCT: 48.6 % (ref 39.0–52.0)
Hemoglobin: 16 g/dL (ref 13.0–17.0)
Immature Granulocytes: 2 %
Lymphocytes Relative: 14 %
Lymphs Abs: 2 10*3/uL (ref 0.7–4.0)
MCH: 27.2 pg (ref 26.0–34.0)
MCHC: 32.9 g/dL (ref 30.0–36.0)
MCV: 82.7 fL (ref 80.0–100.0)
Monocytes Absolute: 1.1 10*3/uL — ABNORMAL HIGH (ref 0.1–1.0)
Monocytes Relative: 7 %
Neutro Abs: 11 10*3/uL — ABNORMAL HIGH (ref 1.7–7.7)
Neutrophils Relative %: 76 %
Platelets: 515 10*3/uL — ABNORMAL HIGH (ref 150–400)
RBC: 5.88 MIL/uL — ABNORMAL HIGH (ref 4.22–5.81)
RDW: 12.1 % (ref 11.5–15.5)
WBC: 14.5 10*3/uL — ABNORMAL HIGH (ref 4.0–10.5)
nRBC: 0 % (ref 0.0–0.2)

## 2020-02-18 LAB — COMPREHENSIVE METABOLIC PANEL
ALT: 29 U/L (ref 0–44)
AST: 19 U/L (ref 15–41)
Albumin: 4 g/dL (ref 3.5–5.0)
Alkaline Phosphatase: 100 U/L (ref 38–126)
Anion gap: 23 — ABNORMAL HIGH (ref 5–15)
BUN: 9 mg/dL (ref 6–20)
CO2: 13 mmol/L — ABNORMAL LOW (ref 22–32)
Calcium: 9.9 mg/dL (ref 8.9–10.3)
Chloride: 102 mmol/L (ref 98–111)
Creatinine, Ser: 0.96 mg/dL (ref 0.61–1.24)
GFR calc Af Amer: >60 mL/min (ref >60)
GFR calc non Af Amer: >60 mL/min (ref >60)
Glucose, Bld: 238 mg/dL — ABNORMAL HIGH (ref 70–99)
Potassium: 4.1 mmol/L (ref 3.5–5.1)
Sodium: 138 mmol/L (ref 135–145)
Total Bilirubin: 1.7 mg/dL — ABNORMAL HIGH (ref 0.3–1.2)
Total Protein: 8.2 g/dL — ABNORMAL HIGH (ref 6.5–8.1)

## 2020-02-18 LAB — BLOOD GAS, VENOUS
Acid-base deficit: 12.3 mmol/L — ABNORMAL HIGH (ref 0.0–2.0)
Bicarbonate: 15 mmol/L — ABNORMAL LOW (ref 20.0–28.0)
O2 Saturation: 28.8 %
Patient temperature: 98.6
pCO2, Ven: 39 mmHg — ABNORMAL LOW (ref 44.0–60.0)
pH, Ven: 7.208 — ABNORMAL LOW (ref 7.250–7.430)
pO2, Ven: 23.6 mmHg — CL (ref 32.0–45.0)

## 2020-02-18 LAB — SARS CORONAVIRUS 2 BY RT PCR (HOSPITAL ORDER, PERFORMED IN ~~LOC~~ HOSPITAL LAB): SARS Coronavirus 2: POSITIVE — AB

## 2020-02-18 LAB — BETA-HYDROXYBUTYRIC ACID
Beta-Hydroxybutyric Acid: 5.35 mmol/L — ABNORMAL HIGH (ref 0.05–0.27)
Beta-Hydroxybutyric Acid: 2.39 mmol/L — ABNORMAL HIGH (ref 0.05–0.27)

## 2020-02-18 MED ORDER — INSULIN ASPART 100 UNIT/ML ~~LOC~~ SOLN
3.0000 [IU] | Freq: Three times a day (TID) | SUBCUTANEOUS | Status: DC
  Filled 2020-02-18: qty 0.03

## 2020-02-18 MED ORDER — DEXTROSE 50 % IV SOLN: 0.0000 mL | INTRAVENOUS | Status: DC | PRN

## 2020-02-18 MED ORDER — POTASSIUM CHLORIDE 10 MEQ/100ML IV SOLN
10.0000 meq | INTRAVENOUS | Status: AC
  Administered 2020-02-18 (×2): 10 meq via INTRAVENOUS
  Filled 2020-02-18 (×2): qty 100

## 2020-02-18 MED ORDER — SODIUM CHLORIDE 0.9 % IV BOLUS
2000.0000 mL | Freq: Once | INTRAVENOUS | Status: AC
  Administered 2020-02-18: 2000 mL via INTRAVENOUS

## 2020-02-18 MED ORDER — ENOXAPARIN SODIUM 40 MG/0.4ML ~~LOC~~ SOLN
40.0000 mg | SUBCUTANEOUS | Status: DC
  Administered 2020-02-18: 40 mg via SUBCUTANEOUS
  Filled 2020-02-18: qty 0.4

## 2020-02-18 MED ORDER — DEXTROSE IN LACTATED RINGERS 5 % IV SOLN: INTRAVENOUS | Status: DC

## 2020-02-18 MED ORDER — INSULIN GLARGINE 100 UNIT/ML ~~LOC~~ SOLN
15.0000 [IU] | Freq: Two times a day (BID) | SUBCUTANEOUS | Status: DC
  Administered 2020-02-18: 15 [IU] via SUBCUTANEOUS
  Filled 2020-02-18: qty 0.15

## 2020-02-18 MED ORDER — INSULIN ASPART 100 UNIT/ML ~~LOC~~ SOLN
0.0000 [IU] | Freq: Three times a day (TID) | SUBCUTANEOUS | Status: DC
  Filled 2020-02-18: qty 0.09

## 2020-02-18 MED ORDER — SODIUM CHLORIDE 0.9 % IV BOLUS: 2000.0000 mL | Freq: Once | INTRAVENOUS | Status: DC

## 2020-02-18 MED ORDER — LACTATED RINGERS IV SOLN: INTRAVENOUS | Status: DC

## 2020-02-18 MED ORDER — INSULIN ASPART 100 UNIT/ML ~~LOC~~ SOLN
0.0000 [IU] | Freq: Every day | SUBCUTANEOUS | Status: DC
  Administered 2020-02-18: 2 [IU] via SUBCUTANEOUS
  Filled 2020-02-18: qty 0.05

## 2020-02-18 MED ORDER — LACTATED RINGERS IV BOLUS
1000.0000 mL | Freq: Once | INTRAVENOUS | Status: AC
  Administered 2020-02-18: 1000 mL via INTRAVENOUS

## 2020-02-18 MED ORDER — INSULIN REGULAR(HUMAN) IN NACL 100-0.9 UT/100ML-% IV SOLN
INTRAVENOUS | Status: AC
  Administered 2020-02-18: 13 [IU]/h via INTRAVENOUS
  Filled 2020-02-18 (×2): qty 100

## 2020-02-18 MED ORDER — SODIUM CHLORIDE 0.9 % IV BOLUS
1000.0000 mL | Freq: Once | INTRAVENOUS | Status: AC
  Administered 2020-02-18: 1000 mL via INTRAVENOUS

## 2020-02-18 NOTE — Care Plan (Addendum)
Abdulhamid Daleon Willinger. is a 19 y.o. male with medical history significant of DM1, BPD, polysubstance abuse who presents to the ED with c/o: L chest pain, mild SOB, multiple falls at home today, CBG reading "high".  Denies  vomiting, diarrhea, or fever. Dx with COVID at [facility] one week ago, He reports having cough, fever/chills.  These have resolved since then per pt and now not having these symptoms any more.  Not vaccinated.  He is admitted for diabetic ketoacidosis,  started on insulin infusion,  IV fluids , Kept NPO,  Pt with DKA with ketones in urine, pH 7.2 on VBG, bicarb 13, anion gap 23.  1. DKA type 1 -could be due to noncompliance. 1. DKA pathway 2. Insulin gtt 3. IVF: 2L bolus then 125 cc/hr LR then 125 cc/hr D5 half 4. BMP Q4H 2.   COVID -/+ 1. Pt says COVID symptoms resolved after having symptoms and positive test reportedly last week. 2.  repeat COVID test positive. 3. CXR neg 4. Airborne precautions in the meantime 3. BPD - 1. Cont home psych meds when med rec completed.

## 2020-02-18 NOTE — ED Notes (Signed)
Date and time results received: 02/18/20 4:43 AM  (use smartphrase ".now" to insert current time)  Test: PO2 Critical Value: 23.6  Name of Provider Notified: Delo  Orders Received? Or Actions Taken?: Orders Received - See Orders for details

## 2020-02-18 NOTE — H&P (Signed)
History and Physical    [name] [name]. ITG:[id number] DOB: [birth year] DOA: 02/18/2020  PCP: Patient, No Pcp Per  Patient coming from: Home  I have personally briefly reviewed patient's old medical records in [name]  Chief Complaint: CBG high  HPI: [name] [name]. is a 19 y.o. male with medical history significant of DM1, BPD, polysubstance abuse.  Pt presents to the ED with c/o L chest pain, mild SOB, multiple falls at home today, CBG reading "high".  No vomiting, no diarrhea, no fever.  Dx with COVID at [facility] one week ago he reports.  Was having cough, fever/chills.  These have resolved since then per pt and now not having these symptoms any more.  Not vaccinated.  Took 12u novolog before coming to ED   ED Course: CXR neg.  Pt with DKA with keytones in urine, pH 7.2 on VBG, bicarb 13, anion gap 23.   Review of Systems: As per HPI, otherwise all review of systems negative.  Past Medical History:  Diagnosis Date  . ADD (attention deficit disorder)   . Allergy   . Anxiety   . Asthma   . Depression   . Epileptic seizure ([place])    "both petit and grand mal; last sz ~ 2 wk ago" (06/17/2013)  . Headache(784.0)    "2-3 times/wk usually" (06/17/2013)  . Heart murmur    "heard a slight one earlier today" (06/17/2013)  . Homeless   . Migraine    "maybe once/month; it's severe" (06/17/2013)  . ODD (oppositional defiant disorder)   . Sickle cell trait ([place])   . Type I diabetes mellitus ([place])    "that's what they're thinking now" (06/17/2013)  . Vision abnormalities    "takes him longer to focus cause; from his sz" (06/17/2013)    Past Surgical History:  Procedure Laterality Date  . CIRCUMCISION  2000  . FINGER SURGERY Left 2001   "crushed pinky; had to repair it" (06/17/2013)     reports that he has been smoking cigarettes. He has been smoking about 0.50 packs per day. He has never used smokeless tobacco. He reports current drug use. Frequency: 7.00  times per week. Drugs: Marijuana and Methamphetamines. He reports that he does not drink alcohol.  Allergies  Allergen Reactions  . Bee Venom Anaphylaxis  . Fish Allergy Anaphylaxis  . Shellfish Allergy Anaphylaxis  . Tea Anaphylaxis    Family History  Problem Relation Age of Onset  . Asthma Mother   . COPD Mother   . Other Mother        possible autoimmune, unclear  . Diabetes Maternal Grandmother   . Heart disease Maternal Grandmother   . Hypertension Maternal Grandmother   . Mental illness Maternal Grandmother   . Heart disease Maternal Grandfather   . Hyperlipidemia Paternal Grandmother   . Hyperlipidemia Paternal Grandfather   . Migraines Sister        Hemiplegic Migraines      Prior to Admission medications   Medication Sig Start Date End Date Taking? Authorizing Provider  mirtazapine (REMERON) 7.5 MG tablet Take 7.5 mg by mouth at bedtime. 01/23/20  Yes [provider]  QUEtiapine (SEROQUEL) 50 MG tablet Take 50 mg by mouth at bedtime. 01/23/20  Yes [provider]  albuterol (PROVENTIL HFA;VENTOLIN HFA) 108 (90 Base) MCG/ACT inhaler Inhale 1 puff into the lungs every 4 (four) hours as needed for wheezing or shortness of breath. 09/16/18   Ghimire, Henreitta Leber, MD  ARIPiprazole (  ABILIFY) 5 MG tablet Take 5 mg by mouth daily. 09/27/19   [provider]  blood glucose meter kit and supplies KIT Dispense based on patient and insurance preference. Use up to four times daily as directed. (FOR ICD-9 250.00, 250.01). 04/22/19   Robinson, [place] N, PA-C  buPROPion (WELLBUTRIN SR) 150 MG 12 hr tablet Take 150 mg by mouth daily. 09/27/19   [provider]  clotrimazole (LOTRIMIN) 1 % cream Apply to affected area 2 times daily 09/18/19   Wieters, [employer], PA-C  FLOVENT HFA 110 MCG/ACT inhaler Inhale 2 puffs into the lungs in the morning and at bedtime. 10/12/19   [provider]  FLUoxetine (PROZAC) 20 MG capsule Take 1 capsule (20 mg total) by  mouth daily. Patient not taking: Reported on 10/06/2019 09/20/18   Patrecia Pour, NP  gabapentin (NEURONTIN) 300 MG capsule Take 300 mg by mouth daily.  01/11/20   [provider]  glucose blood (FREESTYLE TEST STRIPS) test strip Use as instructed 09/16/18   Jonetta Osgood, MD  hydrOXYzine (ATARAX/VISTARIL) 25 MG tablet Take 25 mg by mouth daily.  12/22/19   [provider]  insulin aspart (NOVOLOG FLEXPEN) 100 UNIT/ML FlexPen Inject 1-15 Units into the skin 3 (three) times daily with meals. 1 unit/g of carbohydrate sliding scale. 07/10/19   Caren Griffins, MD  Insulin Glargine (LANTUS) 100 UNIT/ML Solostar Pen Inject 30 Units into the skin 2 (two) times daily. 07/10/19   Gherghe, Vella Redhead, MD  LATUDA 20 MG TABS tablet Take 20 mg by mouth daily.  01/11/20   [provider]  MINIPRESS 1 MG capsule Take 1 mg by mouth daily.  01/11/20   [provider]    Physical Exam: Vitals:   02/17/20 2353 02/17/20 2354 02/18/20 0315 02/18/20 0445  BP: (!) 149/101  (!) 139/92 135/85  Pulse: (!) 151  (!) 117 (!) 109  Resp: '20  18 17  ' Temp: 98.6 F (37 C)     TempSrc: Oral     SpO2: 99%  100% 100%  Weight:  90.7 kg    Height:  '5\' 10"'  (1.778 m)      Constitutional: NAD, calm, comfortable Eyes: PERRL, lids and conjunctivae normal ENMT: Mucous membranes are moist. Posterior pharynx clear of any exudate or lesions.Normal dentition.  Neck: normal, supple, no masses, no thyromegaly Respiratory: clear to auscultation bilaterally, no wheezing, no crackles. Normal respiratory effort. No accessory muscle use.  Cardiovascular: Regular rate and rhythm, no murmurs / rubs / gallops. No extremity edema. 2+ pedal pulses. No carotid bruits.  Abdomen: no tenderness, no masses palpated. No hepatosplenomegaly. Bowel sounds positive.  Musculoskeletal: no clubbing / cyanosis. No joint deformity upper and lower extremities. Good ROM, no contractures. Normal muscle tone.  Skin: no rashes,  lesions, ulcers. No induration Neurologic: CN 2-12 grossly intact. Sensation intact, DTR normal. Strength 5/5 in all 4.  Psychiatric: Normal judgment and insight. Alert and oriented x 3. Normal mood.    Labs on Admission: I have personally reviewed following labs and imaging studies  CBC: Recent Labs  Lab 02/17/20 2356  WBC 14.5*  NEUTROABS 11.0*  HGB 16.0  HCT 48.6  MCV 82.7  PLT 785*   Basic Metabolic Panel: Recent Labs  Lab 02/17/20 2356  NA 138  K 4.1  CL 102  CO2 13*  GLUCOSE 238*  BUN 9  CREATININE 0.96  CALCIUM 9.9   GFR: Estimated Creatinine Clearance: 139.1 mL/min (by C-G formula based on  SCr of 0.96 mg/dL). Liver Function Tests: Recent Labs  Lab 02/17/20 2356  AST 19  ALT 29  ALKPHOS 100  BILITOT 1.7*  PROT 8.2*  ALBUMIN 4.0   No results for input(s): LIPASE, AMYLASE in the last 168 hours. No results for input(s): AMMONIA in the last 168 hours. Coagulation Profile: No results for input(s): INR, PROTIME in the last 168 hours. Cardiac Enzymes: No results for input(s): CKTOTAL, CKMB, CKMBINDEX, TROPONINI in the last 168 hours. BNP (last 3 results) No results for input(s): PROBNP in the last 8760 hours. HbA1C: No results for input(s): HGBA1C in the last 72 hours. CBG: Recent Labs  Lab 02/17/20 2356 02/18/20 0218 02/18/20 0406 02/18/20 0531  GLUCAP 233* 248* 223* 312*   Lipid Profile: No results for input(s): CHOL, HDL, LDLCALC, TRIG, CHOLHDL, LDLDIRECT in the last 72 hours. Thyroid Function Tests: No results for input(s): TSH, T4TOTAL, FREET4, T3FREE, THYROIDAB in the last 72 hours. Anemia Panel: No results for input(s): VITAMINB12, FOLATE, FERRITIN, TIBC, IRON, RETICCTPCT in the last 72 hours. Urine analysis:    Component Value Date/Time   COLORURINE STRAW (A) 02/18/2020 0334   APPEARANCEUR CLEAR 02/18/2020 0334   LABSPEC 1.018 02/18/2020 0334   PHURINE 5.0 02/18/2020 0334   GLUCOSEU >=500 (A) 02/18/2020 0334   HGBUR NEGATIVE  02/18/2020 0334   BILIRUBINUR NEGATIVE 02/18/2020 0334   KETONESUR 80 (A) 02/18/2020 0334   PROTEINUR NEGATIVE 02/18/2020 0334   UROBILINOGEN 0.2 09/18/2019 1339   NITRITE NEGATIVE 02/18/2020 0334   LEUKOCYTESUR NEGATIVE 02/18/2020 0334    Radiological Exams on Admission: DG Chest 1 View  Result Date: 02/18/2020 CLINICAL DATA:  Shortness of breath EXAM: CHEST  1 VIEW COMPARISON:  11/27/2019 FINDINGS: The heart size and mediastinal contours are within normal limits. Both lungs are clear. The visualized skeletal structures are unremarkable. IMPRESSION: No active disease. Electronically Signed   By: Ulyses Jarred M.D.   On: 02/18/2020 00:49    EKG: Independently reviewed.  Assessment/Plan Principal Problem:   DKA, type 1 (HCC) Active Problems:   Type 1 diabetes mellitus in patient 42 to 21 years of age with hemoglobin A1c goal of less than 7.5% (HCC)   Borderline personality disorder ([place])   Bipolar disorder ([place])    1. DKA type 1 - 1. DKA pathway 2. Insulin gtt 3. IVF: 2L bolus then 125 cc/hr LR then 125 cc/hr D5 half 4. BMP Q4H 2. ? Of COVID - 1. Pt says COVID symptoms resolved after having symptoms and positive test reportedly last week. 2. Getting repeat COVID test today 3. CXR neg 4. Airborne precautions in the meantime 3. BPD - 1. Cont home psych meds when med rec completed  DVT prophylaxis: Lovenox Code Status: Full Family Communication: No family in room Disposition Plan: Home after DKA treated Consults called: None Admission status: Admit to inpatient  Severity of Illness: The appropriate patient status for this patient is INPATIENT. Inpatient status is judged to be reasonable and necessary in order to provide the required intensity of service to ensure the patient's safety. The patient's presenting symptoms, physical exam findings, and initial radiographic and laboratory data in the context of their chronic comorbidities is felt to place them at high risk for  further clinical deterioration. Furthermore, it is not anticipated that the patient will be medically stable for discharge from the hospital within 2 midnights of admission. The following factors support the patient status of inpatient.   IP status for treatment of DKA.  Has anion gap, has  keytones in urine.  * I certify that at the point of admission it is my clinical judgment that the patient will require inpatient hospital care spanning beyond 2 midnights from the point of admission due to high intensity of service, high risk for further deterioration and high frequency of surveillance required.*    [name] [name] M. DO [facility]  How to contact the [facility] Attending or Consulting provider [place] or covering provider during after hours [place], for this patient?  1. Check the care team in [facility] and look for a) attending/consulting [facility] provider listed and b) the [facility] team listed 2. Log into [email or web address]  Amion Physician Scheduling and messaging for groups and whole hospitals  On call and physician scheduling software for group practices, residents, hospitalists and other medical providers for call, clinic, rotation and shift schedules. OnCall [employer] is a hospital-wide system for scheduling doctors and paging doctors on call. EasyPlot is for scientific plotting and data analysis.  [email or web address]  and use [place]'s universal password to access. If you do not have the password, please contact the hospital operator.  3. Locate the [facility] provider you are looking for under [facility] and page to a number that you can be directly reached. 4. If you still have difficulty reaching the provider, please page the [facility] (Director on Call) for the Hospitalists listed on amion for assistance.  02/18/2020, 6:05 AM

## 2020-02-18 NOTE — Progress Notes (Signed)
Inpatient Diabetes Program Recommendations  AACE/ADA: New Consensus Statement on Inpatient Glycemic Control (2015)  Target Ranges:  Prepandial:   less than 140 mg/dL      Peak postprandial:   less than 180 mg/dL (1-2 hours)      Critically ill patients:  140 - 180 mg/dL   Lab Results  Component Value Date   GLUCAP 169 (H) 02/18/2020   HGBA1C 11.3 (H) 01/16/2020    Review of Glycemic Control Results for [name] [name], [name] [name] (MRN [id number]) as of 02/18/2020 09:36  Ref. Range 02/18/2020 05:28  CO2 Latest Ref Range: 22 - 32 mmol/L 14 (L)  Glucose Latest Ref Range: 70 - 99 mg/dL 563 (H)  BUN Latest Ref Range: 6 - 20 mg/dL 8  Creatinine Latest Ref Range: 0.61 - 1.24 mg/dL 8.93  Calcium Latest Ref Range: 8.9 - 10.3 mg/dL 7.8 (L)  Anion gap Latest Ref Range: 5 - 15  17 (H)   Results for [name] [name], [name] [name] (MRN [id number]) as of 02/18/2020 09:36  Ref. Range 02/18/2020 05:28  Beta-Hydroxybutyric Acid Latest Ref Range: 0.05 - 0.27 mmol/L 5.35 (H)    Diabetes history: DM type 1 (requires basal insulin and correction/carb coverage insulins) Outpatient Diabetes medications: Lantus 30 units bid, Novolog 1-15 units tid Current orders for Inpatient glycemic control: IV insulin/Endotool  A1c 11.3% on 7/19  Inpatient Diabetes Program Recommendations:    - IV insulin for now. Will need close to home regimen when time for transition.  Patient well known to the Inpatient Diabetes Team (frequent ED visits and admissions over the last 6 months)  Thanks,  Christena Deem RN, MSN, BC-ADM Inpatient Diabetes Coordinator Team Pager [phone] (8a-5p)

## 2020-02-18 NOTE — ED Provider Notes (Signed)
[facility] DEPT Provider Note   CSN: [id number] Arrival date & time: 02/17/20  2338     History No chief complaint on file.   [name] Nixon. is a 19 y.o. male.  Patient to ED with complaint of pain in his left chest, mild SOB, lightheadedness with multiple falls today, and CBG at home reading "high". No vomiting, diarrhea or fever. Diagnosed with COVID one week ago when having cough, chills/fever which have resolved since then. He reports taking some insulin at home to correct his blood sugar. No syncope or near syncope. History of T1DM and polysubstance abuse. He denies recent drug use.   The history is provided by the patient. No language interpreter was used.       Past Medical History:  Diagnosis Date  . ADD (attention deficit disorder)   . Allergy   . Anxiety   . Asthma   . Depression   . Epileptic seizure ([place])    "both petit and grand mal; last sz ~ 2 wk ago" (06/17/2013)  . Headache(784.0)    "2-3 times/wk usually" (06/17/2013)  . Heart murmur    "heard a slight one earlier today" (06/17/2013)  . Homeless   . Migraine    "maybe once/month; it's severe" (06/17/2013)  . ODD (oppositional defiant disorder)   . Sickle cell trait ([place])   . Type I diabetes mellitus ([place])    "that's what they're thinking now" (06/17/2013)  . Vision abnormalities    "takes him longer to focus cause; from his sz" (06/17/2013)    Patient Active Problem List   Diagnosis Date Noted  . Polysubstance abuse ([place])   . Hyperkalemia   . Hypocalcemia   . 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 Suicidal ideation 10/06/2019  . Bipolar disorder ([place]) 10/06/2019  . Bipolar 1 disorder, depressed, severe ([place]) 10/05/2019  . MDD (major depressive disorder) 09/28/2019  . Anxiety 07/10/2019  . Hyponatremia 07/10/2019  . Methamphetamine intoxication ([place]) 10/26/2018  . AKI (acute kidney injury) ([place]) 09/15/2018  . URI (upper  respiratory infection) 09/15/2018  . Adjustment disorder with mixed disturbance of emotions and conduct   . Atypical chest pain 08/19/2018  . Borderline personality disorder ([place]) 02/26/2018  . 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 Major depressive disorder, recurrent severe without psychotic features ([place]) 07/24/2017  . DKA, type 1 ([name]) 07/16/2017  . Nausea & vomiting 07/16/2017  . Uncontrolled type 1 diabetes circulatory disorder erectile dysfunction ([place])   . DKA (diabetic ketoacidoses) ([place]) 12/18/2016  . History of seizures 12/01/2016  . History of migraine 12/01/2016  . Disordered eating 03/08/2015  . Ketonuria   . Adjustment reaction to medical therapy   . Non compliance w medication regimen   . Diabetic peripheral neuropathy associated with type 1 diabetes mellitus ([name]) 09/29/2014  . Dehydration 08/22/2014  . Hyperglycemia due to type 1 diabetes mellitus ([place]) 08/21/2014  . Type 1 diabetes mellitus with hyperglycemia ([place])   . Noncompliance   . Generalized abdominal pain   . Glycosuria   . Depression 07/12/2014  . Involuntary movements 06/13/2014  . Bilateral leg pain 06/13/2014  . 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 Suicidal ideations 03/31/2014  . ODD (oppositional defiant disorder) 03/03/2014  . MDD (major depressive disorder), recurrent episode, moderate ([place]) 02/16/2014  . Attention deficit hyperactivity disorder (ADHD), combined type, moderate 02/16/2014  . Microcytic anemia 02/15/2014  . Hyperglycemia 02/14/2014  .  Goiter 02/04/2014  . 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[place]) 02/04/2014  . Acquired acanthosis nigricans 02/04/2014  . Obesity, morbid ([place]) 02/04/2014  . Insulin resistance 02/04/2014  . Hyperinsulinemia 02/04/2014  . Hypoglycemia associated with diabetes ([place]) 02/02/2014  . Maladaptive health behaviors affecting  medical condition 02/02/2014  . Hypoglycemia 02/02/2014  . Type 1 diabetes mellitus in patient 58 to 21 years of age with hemoglobin A1c goal of less than 7.5% ([place]) 01/26/2014  . Partial epilepsy with impairment of consciousness ([place]) 12/13/2013  . 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 Migraine without aura 12/13/2013  . Body mass index, pediatric, greater than or equal to 95th percentile for age 21/16/2015  . Asthma 12/13/2013  . Hypoglycemia unawareness in type 1 diabetes mellitus ([place]) 08/08/2013  . Epilepsy ([place]) 07/06/2013  . Short-term memory loss 07/06/2013  . Sickle cell trait ([place]) 07/06/2013  . Diabetes ([place]) 06/17/2013  . Diabetes mellitus, new onset ([place]) 06/17/2013    Past Surgical History:  Procedure Laterality Date  . CIRCUMCISION  2000  . FINGER SURGERY Left 2001   "crushed pinky; had to repair it" (06/17/2013)       Family History  Problem Relation Age of Onset  . Asthma Mother   . COPD Mother   . Other Mother        possible autoimmune, unclear  . Diabetes Maternal Grandmother   . Heart disease Maternal Grandmother   . Hypertension Maternal Grandmother   . Mental illness Maternal Grandmother   . Heart disease Maternal Grandfather   . Hyperlipidemia Paternal Grandmother   . Hyperlipidemia Paternal Grandfather   . Migraines Sister        Hemiplegic Migraines     Social History   Tobacco Use  . Smoking status: Current Every Day Smoker    Packs/day: 0.50    Types: Cigarettes  . Smokeless tobacco: Never Used  . Tobacco comment: Mom and dad smoke outside   Vaping Use  . Vaping Use: Former  Substance Use Topics  . Alcohol use: No    Alcohol/week: 0.0 standard drinks  . Drug use: Yes    Frequency: 7.0 times per week    Types: Marijuana, Methamphetamines    Home Medications Prior to Admission medications   Medication Sig Start Date End Date Taking? Authorizing Provider  albuterol (PROVENTIL HFA;VENTOLIN HFA) 108 (90 Base) MCG/ACT  inhaler Inhale 1 puff into the lungs every 4 (four) hours as needed for wheezing or shortness of breath. 09/16/18   Ghimire, Henreitta Leber, MD  ARIPiprazole (ABILIFY) 5 MG tablet Take 5 mg by mouth daily. 09/27/19   [provider]  blood glucose meter kit and supplies KIT Dispense based on patient and insurance preference. Use up to four times daily as directed. (FOR ICD-9 250.00, 250.01). 04/22/19   Robinson, [place] N, PA-C  buPROPion (WELLBUTRIN SR) 150 MG 12 hr tablet Take 150 mg by mouth daily. 09/27/19   [provider]  clotrimazole (LOTRIMIN) 1 % cream Apply to affected area 2 times daily 09/18/19   Wieters, [employer], PA-C  FLOVENT HFA 110 MCG/ACT inhaler Inhale 2 puffs into the lungs in the morning and at bedtime. 10/12/19   [provider]  FLUoxetine (PROZAC) 20 MG capsule Take 1 capsule (20 mg total) by mouth daily. Patient not taking: Reported on 10/06/2019 09/20/18   Patrecia Pour, NP  gabapentin (NEURONTIN) 300 MG capsule Take 300 mg by mouth daily.  01/11/20   [provider]  glucose  blood (FREESTYLE TEST STRIPS) test strip Use as instructed 09/16/18   Jonetta Osgood, MD  hydrOXYzine (ATARAX/VISTARIL) 25 MG tablet Take 25 mg by mouth daily.  12/22/19   [provider]  insulin aspart (NOVOLOG FLEXPEN) 100 UNIT/ML FlexPen Inject 1-15 Units into the skin 3 (three) times daily with meals. 1 unit/g of carbohydrate sliding scale. 07/10/19   Caren Griffins, MD  Insulin Glargine (LANTUS) 100 UNIT/ML Solostar Pen Inject 30 Units into the skin 2 (two) times daily. 07/10/19   Gherghe, Vella Redhead, MD  LATUDA 20 MG TABS tablet Take 20 mg by mouth daily.  01/11/20   [provider]  MINIPRESS 1 MG capsule Take 1 mg by mouth daily.  01/11/20   [provider]    Allergies    Bee venom, Fish allergy, Shellfish allergy, and Tea  Review of Systems   Review of Systems  Constitutional: Negative for chills, diaphoresis and fever.  HENT: Negative.    Respiratory: Positive for shortness of breath.   Cardiovascular: Positive for chest pain and palpitations.  Gastrointestinal: Negative for abdominal pain, diarrhea, nausea and vomiting.  Musculoskeletal: Negative.  Negative for myalgias.  Skin: Negative.   Neurological: Positive for weakness and light-headedness. Negative for syncope and headaches.    Physical Exam Updated Vital Signs BP (!) 149/101 (BP Location: Left Arm)   Pulse (!) 151   Temp 98.6 F (37 C) (Oral)   Resp 20   Ht '5\' 10"'  (1.778 m)   Wt 90.7 kg   SpO2 99%   BMI 28.70 kg/m   Physical Exam Vitals and nursing note reviewed.  Constitutional:      Appearance: He is well-developed.  HENT:     Head: Normocephalic.     Mouth/Throat:     Mouth: Mucous membranes are dry.  Cardiovascular:     Rate and Rhythm: Regular rhythm. Tachycardia present.     Heart sounds: No murmur heard.   Pulmonary:     Effort: Pulmonary effort is normal.     Breath sounds: Normal breath sounds. No wheezing, rhonchi or rales.  Chest:     Chest wall: Tenderness (Mild left chest tenderness) present.  Abdominal:     General: Bowel sounds are normal.     Palpations: Abdomen is soft.     Tenderness: There is no abdominal tenderness. There is no guarding or rebound.  Musculoskeletal:        General: Normal range of motion.     Cervical back: Normal range of motion and neck supple.  Skin:    General: Skin is warm and dry.     Findings: No rash.  Neurological:     Mental Status: He is alert.     ED Results / Procedures / Treatments   Labs (all labs ordered are listed, but only abnormal results are displayed) Labs Reviewed  CBC WITH DIFFERENTIAL/PLATELET - Abnormal; Notable for the following components:      Result Value   WBC 14.5 (*)    RBC 5.88 (*)    Platelets 515 (*)    Neutro Abs 11.0 (*)    Monocytes Absolute 1.1 (*)    Abs Immature Granulocytes 0.32 (*)    All other components within normal limits  COMPREHENSIVE  METABOLIC PANEL - Abnormal; Notable for the following components:   CO2 13 (*)    Glucose, Bld 238 (*)    Total Protein 8.2 (*)    Total Bilirubin 1.7 (*)    Anion gap 23 (*)  All other components within normal limits  CBG MONITORING, ED - Abnormal; Notable for the following components:   Glucose-Capillary 233 (*)    All other components within normal limits  CBG MONITORING, ED - Abnormal; Notable for the following components:   Glucose-Capillary 248 (*)    All other components within normal limits  URINALYSIS, ROUTINE W REFLEX MICROSCOPIC  BLOOD GAS, VENOUS    EKG None  Radiology DG Chest 1 View  Result Date: 02/18/2020 CLINICAL DATA:  Shortness of breath EXAM: CHEST  1 VIEW COMPARISON:  11/27/2019 FINDINGS: The heart size and mediastinal contours are within normal limits. Both lungs are clear. The visualized skeletal structures are unremarkable. IMPRESSION: No active disease. Electronically Signed   By: Ulyses Jarred M.D.   On: 02/18/2020 00:49    Procedures Procedures (including critical care time) CRITICAL CARE Performed by: Dewaine Oats   Total critical care time: 45 minutes  Critical care time was exclusive of separately billable procedures and treating other patients.  Critical care was necessary to treat or prevent imminent or life-threatening deterioration.  Critical care was time spent personally by me on the following activities: development of treatment plan with patient and/or surrogate as well as nursing, discussions with consultants, evaluation of patient's response to treatment, examination of patient, obtaining history from patient or surrogate, ordering and performing treatments and interventions, ordering and review of laboratory studies, ordering and review of radiographic studies, pulse oximetry and re-evaluation of patient's condition.  Medications Ordered in ED Medications  sodium chloride 0.9 % bolus 2,000 mL (2,000 mLs Intravenous New Bag/Given  02/18/20 0224)    ED Course  I have reviewed the triage vital signs and the nursing notes.  Pertinent labs & imaging results that were available during my care of the patient were reviewed by me and considered in my medical decision making (see chart for details).    MDM Rules/Calculators/A&P                          Patient with h/o T1DM, polysubstance abuse, presents with ss/sxs as per HPI.  CBG here is 230 with signs of acidosis with a CO2 13 and anion gap of 23. He is significantly tachycardic in the 150's. IV started for aggressive hydration.   EKG shows sinus tach. Chest pain is reproducible. Doubt ACS. CXR clear.   He is given 2L fluids with improvement in tachycardia. However, VBG shows pH of 7.208 c/w DKA in this setting. He will need to be admitted for further management. The patient has a history of leaving AMA but agrees to stay for this admission.   Final Clinical Impression(s) / ED Diagnoses Final diagnoses:  SOB (shortness of breath)   1. DKA 2. T1DM 3. Recent COVID infection  Rx / DC Orders ED Discharge Orders    None       Charlann Lange, PA-C 02/18/20 0529    [name] Speak, MD 02/27/20 [phone]

## 2020-02-19 NOTE — ED Notes (Signed)
Pt stated that he would like to sign himself out, and go AMA. When asked why he would like to leave pt states that he has no child care for tomorrow. I explained to the pt that in his current condition it is still recommended that he stay in the hospital, and there is a high likelihood that he will have to return. I also explained to the pt that given his risk factors it could be dangerous to leave at this time. Pt still requested to leave AMA.

## 2020-02-19 NOTE — Discharge Summary (Signed)
Physician Discharge Summary  [name] [name]. HWE:[id number] DOB: 2000-10-13 DOA: 02/18/2020  PCP: Patient, No Pcp Per  Admit date: 02/18/2020   Discharge date: 02/19/2020  Admitted From:  Home Disposition:   Left against medical advice.  Recommendations for Outpatient Follow-up:  1. Follow up with PCP in 1-2 weeks 2. Please obtain BMP/CBC in one week 3.   Home Health: No Equipment/Devices: No  Discharge Condition: Fair CODE STATUS:Full code Diet recommendation:  Carb Modified  Brief Summary/ Hospital course: DAINEL ARCIDIACONO Jr.is a 52 years oldmalewith medical history significant ofDM1, Bipolar disorder, polysubstance abuse who presents to the ED with c/o: Left sided chest pain, mild SOB, multiple falls at home today, CBG reading "high". Denies  vomiting, diarrhea, or fever. He was Dx with COVID at [facility] one week ago, He reports having cough, intermittent fever/chills. These have resolved since then per patient and now not having these symptoms any more. He is not vaccinated against COVID. He was admitted for diabetic ketoacidosis ( DKA with ketones in urine, pH 7.2 on VBG, bicarb 13, anion gap 23).  He was started on insulin infusion,  IV fluids , Kept NPO, He was improving, Labs improving but patient decided to leave against medical advice, he reports that he doesn't have child care for tomorrow, He was explained risks and benefits of leaving [place].  He understood and stated he had DKA few times before.  Patient has left AGAINST MEDICAL ADVICE.    He was managed for below problems.  1. DKA type 1 -could be due to noncompliance. 1. DKA pathway 2. Insulin gtt 3. IVF: 2L bolus then 125 cc/hr LR then 125 cc/hr D5 half 4. BMP Q4H   2.   COVID -/+ 1. Pt says COVID symptoms resolved after having symptoms and positive test reportedly last week. 2.  repeat COVID test positive. 3. CXR neg 4. Airborne precautions in the meantime.   3. BPD - 1. Cont  home psych meds when med rec completed.  Discharge Diagnoses:  Principal Problem:   DKA, type 1 ([place]) Active Problems:   Type 1 diabetes mellitus in patient 51 to 21 years of age with hemoglobin A1c goal of less than 7.5% (HCC)   Borderline personality disorder ([place])   Bipolar disorder [facility])    Discharge Instructions Patient has left against medical advice.  Allergies as of 02/19/2020      Reactions   Bee Venom Anaphylaxis   Fish Allergy Anaphylaxis   Shellfish Allergy Anaphylaxis   Tea Anaphylaxis      Medication List    ASK your doctor about these medications   albuterol 108 (90 Base) MCG/ACT inhaler Commonly known as: VENTOLIN HFA Inhale 1 puff into the lungs every 4 (four) hours as needed for wheezing or shortness of breath.   ARIPiprazole 5 MG tablet Commonly known as: ABILIFY Take 5 mg by mouth daily.   blood glucose meter kit and supplies Kit Dispense based on patient and insurance preference. Use up to four times daily as directed. (FOR ICD-9 250.00, 250.01).   buPROPion 150 MG 12 hr tablet Commonly known as: WELLBUTRIN SR Take 150 mg by mouth daily.   clotrimazole 1 % cream Commonly known as: LOTRIMIN Apply to affected area 2 times daily   Flovent HFA 110 MCG/ACT inhaler Generic drug: fluticasone Inhale 2 puffs into the lungs in the morning and at bedtime.   FLUoxetine 20 MG capsule Commonly known as: PROZAC Take 1 capsule (20 mg total) by  mouth daily.   gabapentin 300 MG capsule Commonly known as: NEURONTIN Take 300 mg by mouth daily.   glucose blood test strip Commonly known as: FREESTYLE TEST STRIPS Use as instructed   hydrOXYzine 25 MG tablet Commonly known as: ATARAX/VISTARIL Take 25 mg by mouth daily.   insulin glargine 100 UNIT/ML Solostar Pen Commonly known as: LANTUS Inject 30 Units into the skin 2 (two) times daily.   Latuda 20 MG Tabs tablet Generic drug: lurasidone Take 20 mg by mouth daily.   Minipress 1 MG capsule Generic  drug: prazosin Take 1 mg by mouth daily.   mirtazapine 7.5 MG tablet Commonly known as: REMERON Take 7.5 mg by mouth at bedtime.   NovoLOG FlexPen 100 UNIT/ML FlexPen Generic drug: insulin aspart Inject 1-15 Units into the skin 3 (three) times daily with meals. 1 unit/g of carbohydrate sliding scale.   QUEtiapine 50 MG tablet Commonly known as: SEROQUEL Take 50 mg by mouth at bedtime.       Allergies  Allergen Reactions  . Bee Venom Anaphylaxis  . Fish Allergy Anaphylaxis  . Shellfish Allergy Anaphylaxis  . Tea Anaphylaxis    Consultations:  None.   Procedures/Studies: DG Chest 1 View  Result Date: 02/18/2020 CLINICAL DATA:  Shortness of breath EXAM: CHEST  1 VIEW COMPARISON:  11/27/2019 FINDINGS: The heart size and mediastinal contours are within normal limits. Both lungs are clear. The visualized skeletal structures are unremarkable. IMPRESSION: No active disease. Electronically Signed   By: Ulyses Jarred M.D.   On: 02/18/2020 00:49       Subjective: Patient was seen and examined earlier in the day, denies any nausea, vomiting.  Reports feeling better.  Discharge Exam: Vitals:   02/18/20 2108 02/18/20 2354  BP: (!) 155/97 (!) 152/89  Pulse: (!) 119 (!) 122  Resp: 15 16  Temp:    SpO2: 98% 99%   Vitals:   02/18/20 1900 02/18/20 1930 02/18/20 2108 02/18/20 2354  BP: 135/84 (!) 148/98 (!) 155/97 (!) 152/89  Pulse: 87 91 (!) 119 (!) 122  Resp:  '13 15 16  ' Temp:      TempSrc:      SpO2: 100% 100% 98% 99%  Weight:      Height:        General: Pt is alert, awake, not in acute distress Cardiovascular: RRR, S1/S2 +, no rubs, no gallops Respiratory: CTA bilaterally, no wheezing, no rhonchi Abdominal: Soft, NT, ND, bowel sounds + Extremities: no edema, no cyanosis    The results of significant diagnostics from this hospitalization (including imaging, microbiology, ancillary and laboratory) are listed below for reference.     Microbiology: Recent  Results (from the past 240 hour(s))  SARS Coronavirus 2 by RT PCR (hospital order, performed in [facility] hospital lab) Nasopharyngeal Nasopharyngeal Swab     Status: Abnormal   Collection Time: 02/18/20  5:30 AM   Specimen: Nasopharyngeal Swab  Result Value Ref Range Status   SARS Coronavirus 2 POSITIVE (A) NEGATIVE Final    Comment: RESULT CALLED TO, READ BACK BY AND VERIFIED WITH: S.CLAPP,RN [id number] '@1303'  BY V.WILKINS (NOTE) SARS-CoV-2 target nucleic acids are DETECTED  SARS-CoV-2 RNA is generally detectable in upper respiratory specimens  during the acute phase of infection.  Positive results are indicative  of the presence of the identified virus, but do not rule out bacterial infection or co-infection with other pathogens not detected by the test.  Clinical correlation with patient history and  other diagnostic information is necessary  to determine patient infection status.  The expected result is negative.  Fact Sheet for Patients:   StrictlyIdeas.no   Fact Sheet for Healthcare Providers:   BankingDealers.co.za    This test is not yet approved or cleared by the [place] FDA and  has been authorized for detection and/or diagnosis of SARS-CoV-2 by FDA under an Emergency Use Authorization (EUA).  This EUA will remain in effect (meaning this  test can be used) for the duration of  the COVID-19 declaration under Section 564(b)(1) of the Act, 21 U.S.C. section 360-bbb-3(b)(1), unless the authorization is terminated or revoked sooner.  Performed at [facility], [place] [address], [place], [place] [address]      Labs: BNP (last 3 results) No results for input(s): BNP in the last 8760 hours. Basic Metabolic Panel: Recent Labs  Lab 02/18/20 0528 02/18/20 1009 02/18/20 1404 02/18/20 1735 02/18/20 2109  NA 137 138 138 142 138  K 4.6 4.0 3.8 3.6 3.6  CL 106 109 106 107 103  CO2 14* 19* 20* 24 25   GLUCOSE 340* 179* 244* 145* 213*  BUN 8 5* <5* 5* <5*  CREATININE 1.01 0.68 0.64 0.56* 0.53*  CALCIUM 7.8* 8.2* 8.1* 8.6* 8.3*   Liver Function Tests: Recent Labs  Lab 02/17/20 2356  AST 19  ALT 29  ALKPHOS 100  BILITOT 1.7*  PROT 8.2*  ALBUMIN 4.0   No results for input(s): LIPASE, AMYLASE in the last 168 hours. No results for input(s): AMMONIA in the last 168 hours. CBC: Recent Labs  Lab 02/17/20 2356  WBC 14.5*  NEUTROABS 11.0*  HGB 16.0  HCT 48.6  MCV 82.7  PLT 515*   Cardiac Enzymes: No results for input(s): CKTOTAL, CKMB, CKMBINDEX, TROPONINI in the last 168 hours. BNP: Invalid input(s): POCBNP CBG: Recent Labs  Lab 02/18/20 2013 02/18/20 2054 02/18/20 2152 02/18/20 2309 02/18/20 2344  GLUCAP 135* 178* 240* 206* 209*   D-Dimer No results for input(s): DDIMER in the last 72 hours. Hgb A1c No results for input(s): HGBA1C in the last 72 hours. Lipid Profile No results for input(s): CHOL, HDL, LDLCALC, TRIG, CHOLHDL, LDLDIRECT in the last 72 hours. Thyroid function studies No results for input(s): TSH, T4TOTAL, T3FREE, THYROIDAB in the last 72 hours.  Invalid input(s): FREET3 Anemia work up No results for input(s): VITAMINB12, FOLATE, FERRITIN, TIBC, IRON, RETICCTPCT in the last 72 hours. Urinalysis    Component Value Date/Time   COLORURINE STRAW (A) 02/18/2020 0334   APPEARANCEUR CLEAR 02/18/2020 0334   LABSPEC 1.018 02/18/2020 0334   PHURINE 5.0 02/18/2020 0334   GLUCOSEU >=500 (A) 02/18/2020 0334   HGBUR NEGATIVE 02/18/2020 0334   BILIRUBINUR NEGATIVE 02/18/2020 0334   KETONESUR 80 (A) 02/18/2020 0334   PROTEINUR NEGATIVE 02/18/2020 0334   UROBILINOGEN 0.2 09/18/2019 1339   NITRITE NEGATIVE 02/18/2020 0334   LEUKOCYTESUR NEGATIVE 02/18/2020 0334   Sepsis Labs Invalid input(s): PROCALCITONIN,  WBC,  LACTICIDVEN Microbiology Recent Results (from the past 240 hour(s))  SARS Coronavirus 2 by RT PCR (hospital order, performed in [facility]  hospital lab) Nasopharyngeal Nasopharyngeal Swab     Status: Abnormal   Collection Time: 02/18/20  5:30 AM   Specimen: Nasopharyngeal Swab  Result Value Ref Range Status   SARS Coronavirus 2 POSITIVE (A) NEGATIVE Final    Comment: RESULT CALLED TO, READ BACK BY AND VERIFIED WITH: S.CLAPP,RN [id number] '@1303'  BY V.WILKINS (NOTE) SARS-CoV-2 target nucleic acids are DETECTED  SARS-CoV-2 RNA is generally detectable in upper respiratory  specimens  during the acute phase of infection.  Positive results are indicative  of the presence of the identified virus, but do not rule out bacterial infection or co-infection with other pathogens not detected by the test.  Clinical correlation with patient history and  other diagnostic information is necessary to determine patient infection status.  The expected result is negative.  Fact Sheet for Patients:   StrictlyIdeas.no   Fact Sheet for Healthcare Providers:   BankingDealers.co.za    This test is not yet approved or cleared by the [place] FDA and  has been authorized for detection and/or diagnosis of SARS-CoV-2 by FDA under an Emergency Use Authorization (EUA).  This EUA will remain in effect (meaning this  test can be used) for the duration of  the COVID-19 declaration under Section 564(b)(1) of the Act, 21 U.S.C. section 360-bbb-3(b)(1), unless the authorization is terminated or revoked sooner.  Performed at [facility], [place] [address], [place], [place] [address]      Time coordinating discharge: No charge.  SIGNED:   Shawna Clamp, MD  [facility] 02/19/2020, 3:40 PM   If 7PM-7AM, please contact night-coverage [email or web address]

## 2020-02-29 ENCOUNTER — Encounter: Payer: Self-pay | Admitting: Endocrinology

## 2020-02-29 NOTE — Progress Notes (Signed)
This encounter was created in error - please disregard.

## 2020-03-01 ENCOUNTER — Encounter: Payer: Medicaid Other | Admitting: Endocrinology

## 2020-04-10 IMAGING — CT CT ABDOMEN AND PELVIS WITH CONTRAST
2 of 4 series · 16 of 46 positions shown, 18 images · IV contrast (APPLIED)
Comparison: 08/29/2018

CLINICAL DATA: Diffuse abdominal pain for 4 days

EXAM:
CT ABDOMEN AND PELVIS WITH CONTRAST
TECHNIQUE: Multidetector CT imaging of the abdomen and pelvis was performed
using the standard protocol following bolus administration of
intravenous contrast.
CONTRAST:  100mL OMNIPAQUE IOHEXOL 300 MG/ML  SOLN

[Series 3: abdomen 5.0 · axial · 0.82mm/px · z∈[+852,+1322]mm · 13 of 106 slices shown, 15 images]
[im 6/106  soft-tissue]
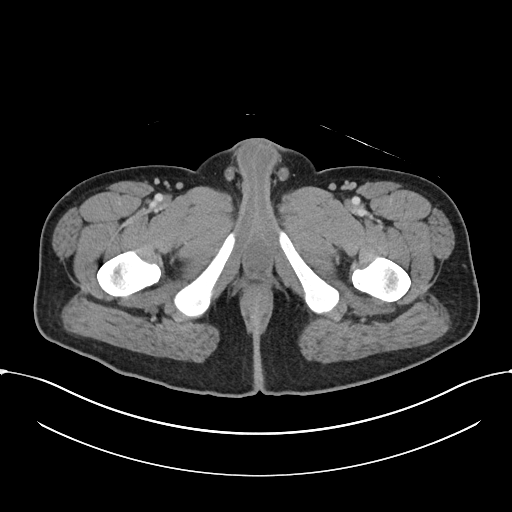
[im 6/106  bone]
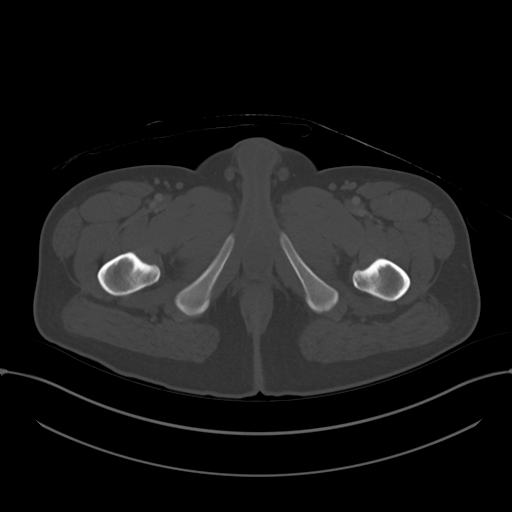
[im 17/106  soft-tissue]
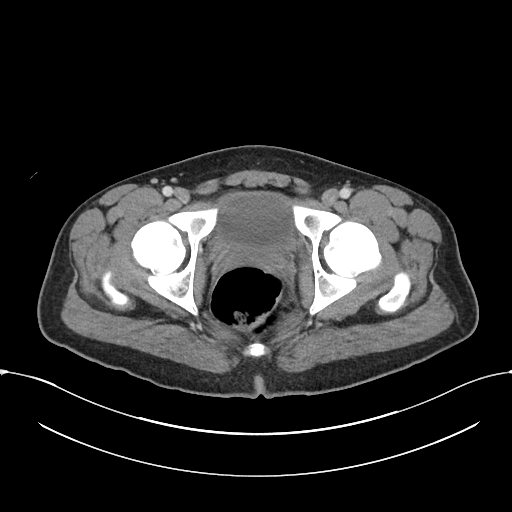
[im 23/106  soft-tissue]
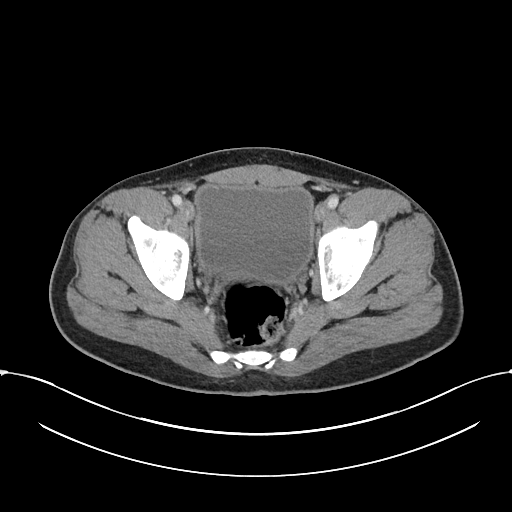
[im 28/106  soft-tissue]
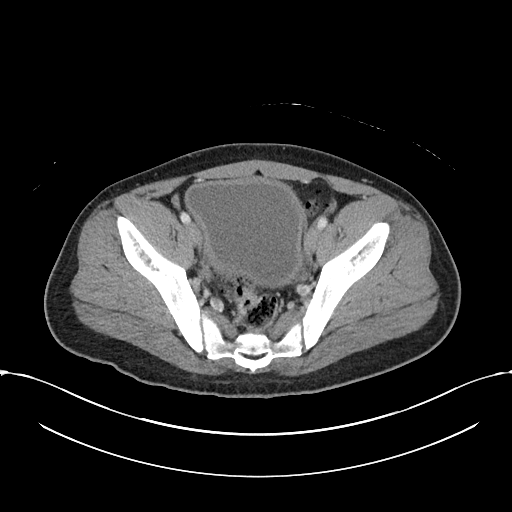
[im 39/106  soft-tissue]
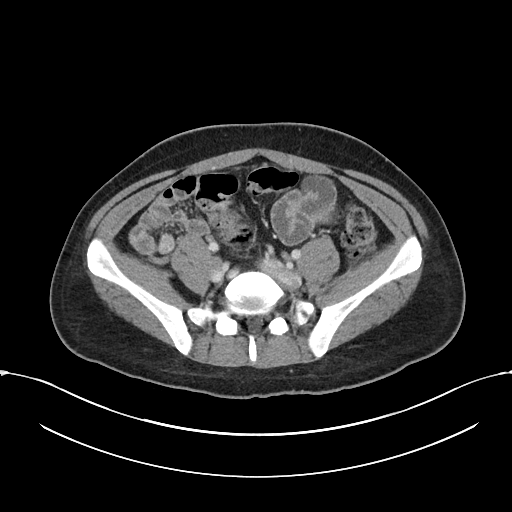
[im 45/106  soft-tissue]
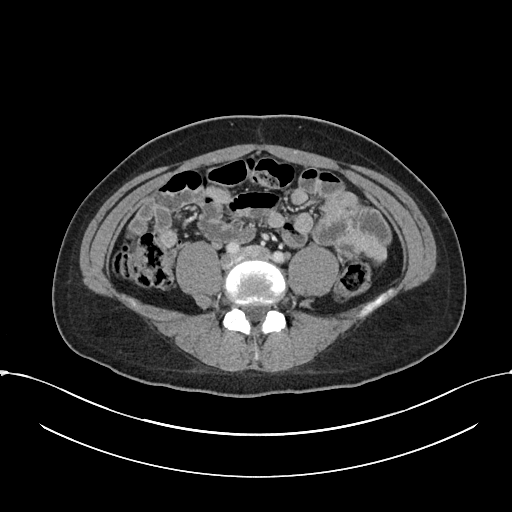
[im 56/106  soft-tissue]
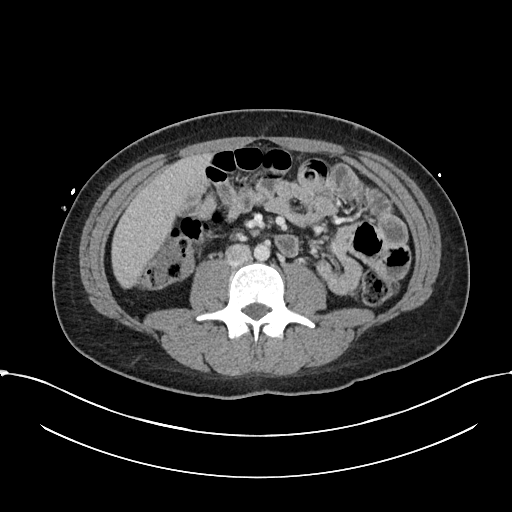
[im 61/106  soft-tissue]
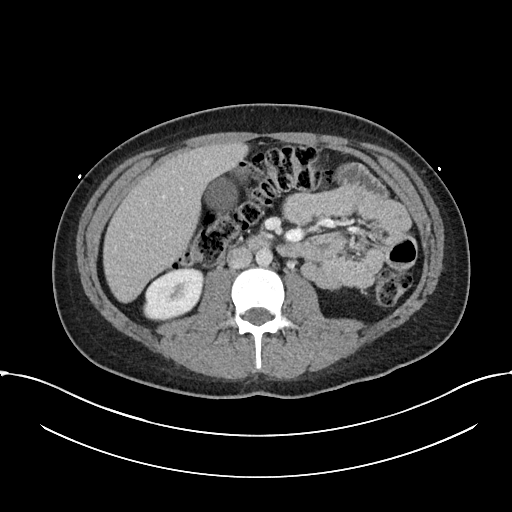
[im 67/106  soft-tissue]
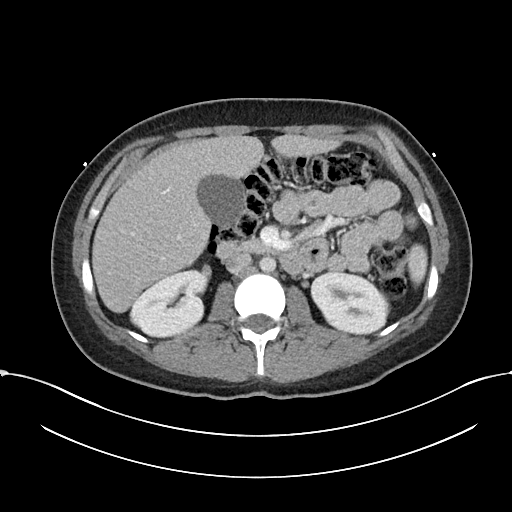
[im 67/106  bone]
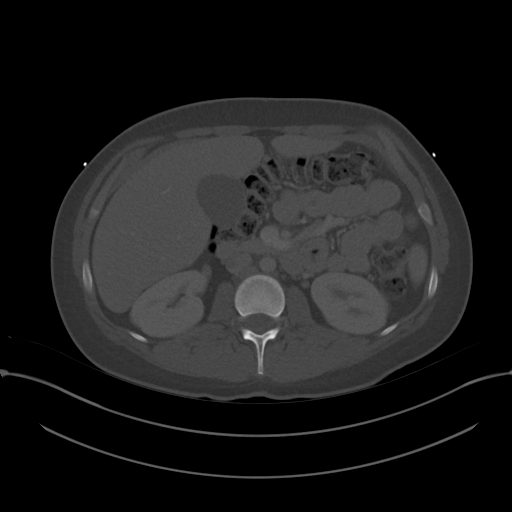
[im 78/106  soft-tissue]
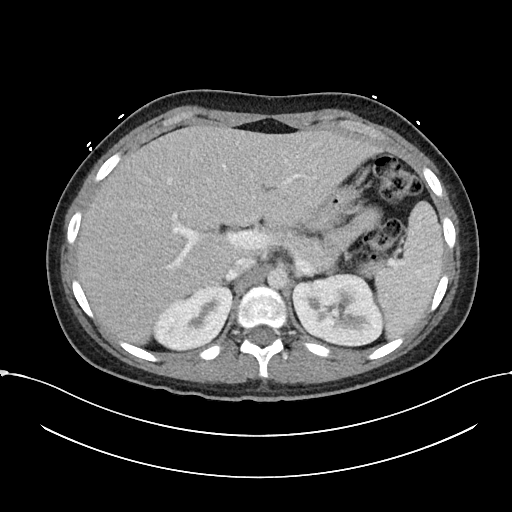
[im 83/106  soft-tissue]
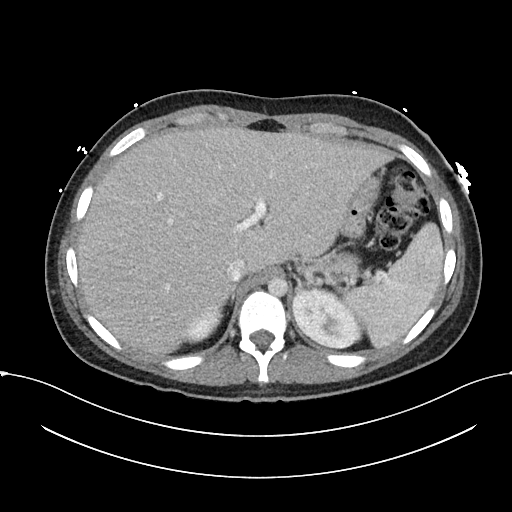
[im 89/106  soft-tissue]
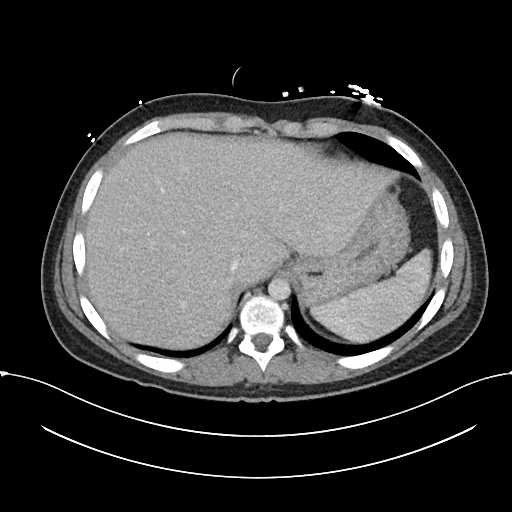
[im 100/106  soft-tissue]
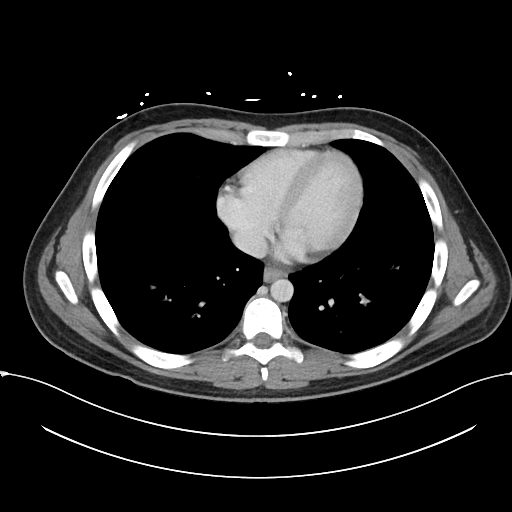

[Series 6: abdomen 3.0 mpr cor · coronal · 0.80mm/px · 3 of 79 slices shown]
[im 27/79  soft-tissue]
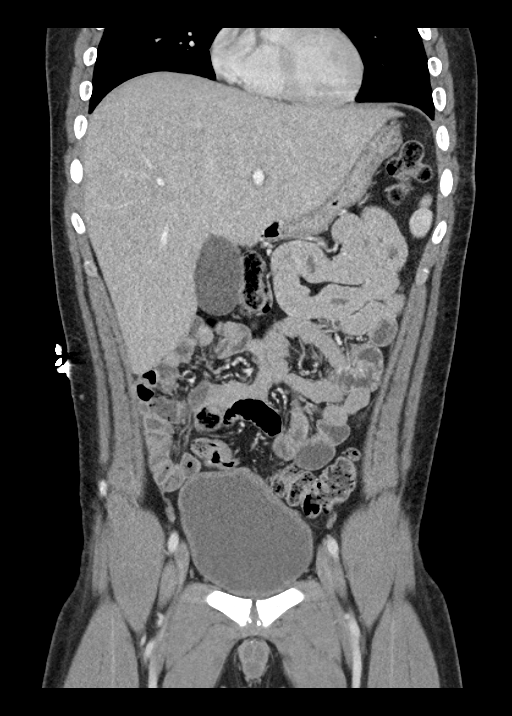
[im 35/79  soft-tissue]
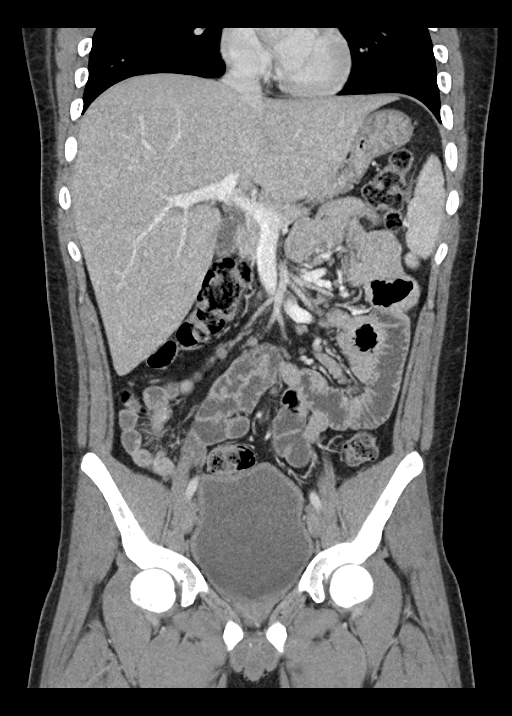
[im 44/79  soft-tissue]
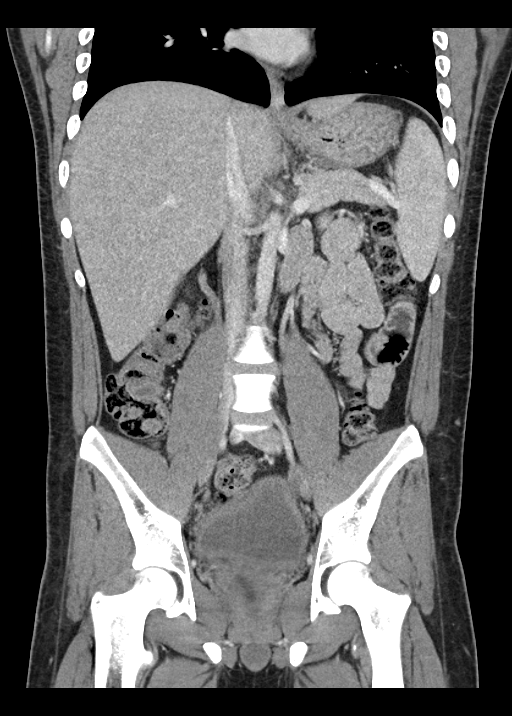

[16 of 46 positions shown; findings below may reference images not displayed]

FINDINGS: Lower chest:  No contributory findings.

Hepatobiliary: Probable hepatic steatosis.No evidence of biliary
obstruction or stone.

Pancreas: Unremarkable.

Spleen: Unremarkable.

Adrenals/Urinary Tract: Negative adrenals. No hydronephrosis or
stone. Nonspecific generalized prominence of the bladder wall
thickness without surrounding fat edema. Urinalysis has been
obtained.

Stomach/Bowel:  No obstruction. No appendicitis.

Vascular/Lymphatic: No acute vascular abnormality. No mass or
adenopathy.

Reproductive:No pathologic findings.

Other: No ascites or pneumoperitoneum.

Musculoskeletal: No acute abnormalities.
IMPRESSION: 1. No acute finding.
2. Probable hepatic steatosis.

## 2020-04-10 IMAGING — US ULTRASOUND ABDOMEN LIMITED
1 series · 14 of 25 positions shown · non-contrast
Comparison: Abdominal CT from earlier today

CLINICAL DATA: Transaminitis

EXAM:
ULTRASOUND ABDOMEN LIMITED RIGHT UPPER QUADRANT

[Series 1: ultrasound abdomen limited · 14 of 55 slices shown]
[im 1/55]
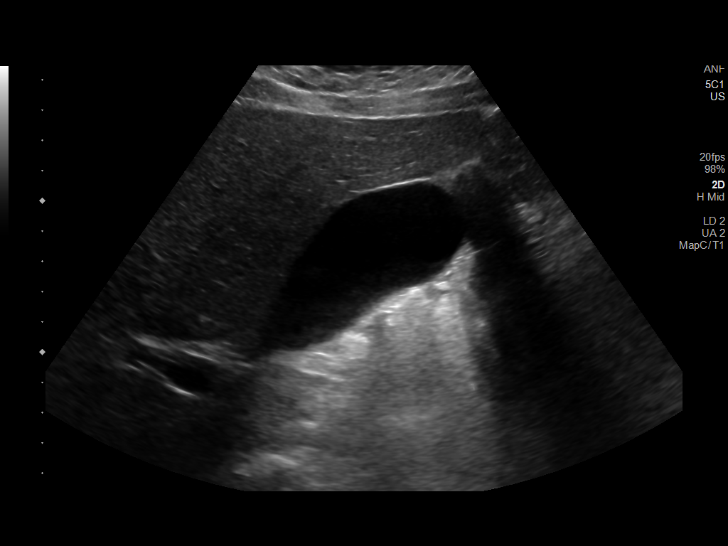
[im 5/55]
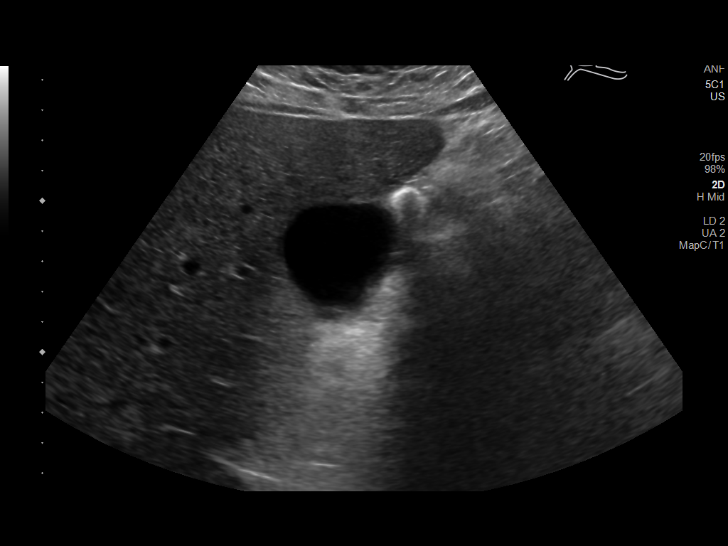
[im 10/55]
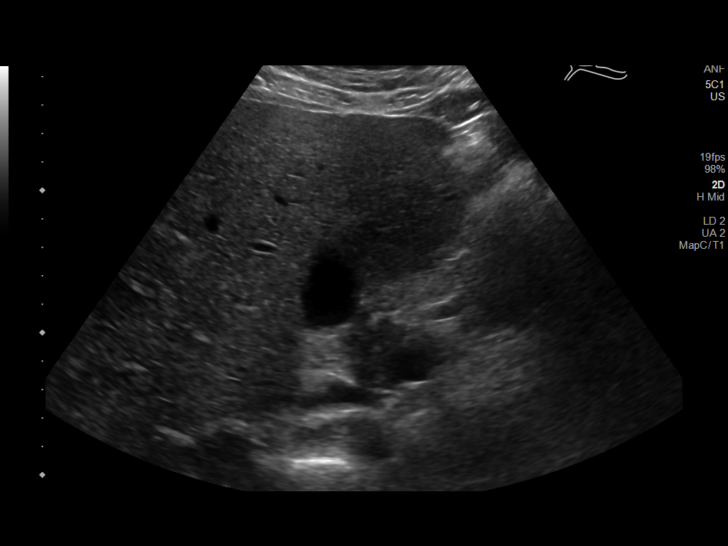
[im 14/55]
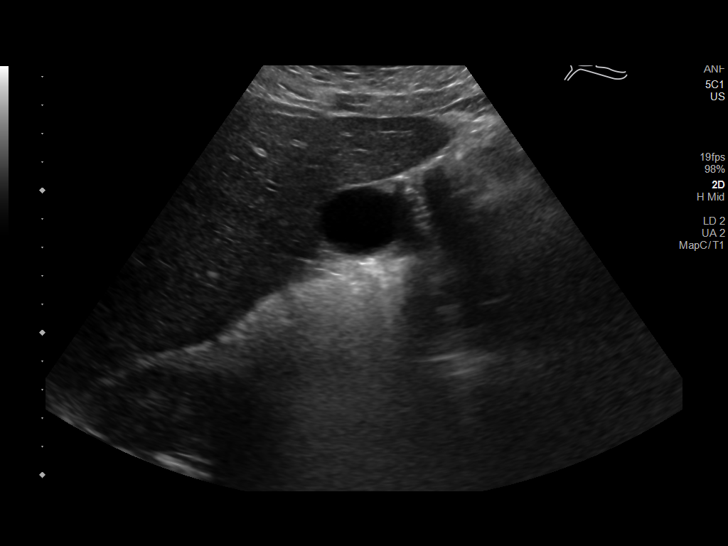
[im 19/55]
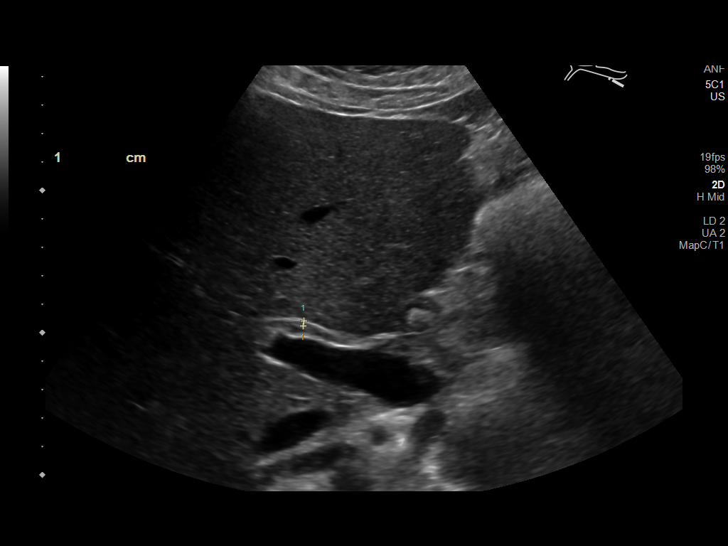
[im 21/55]
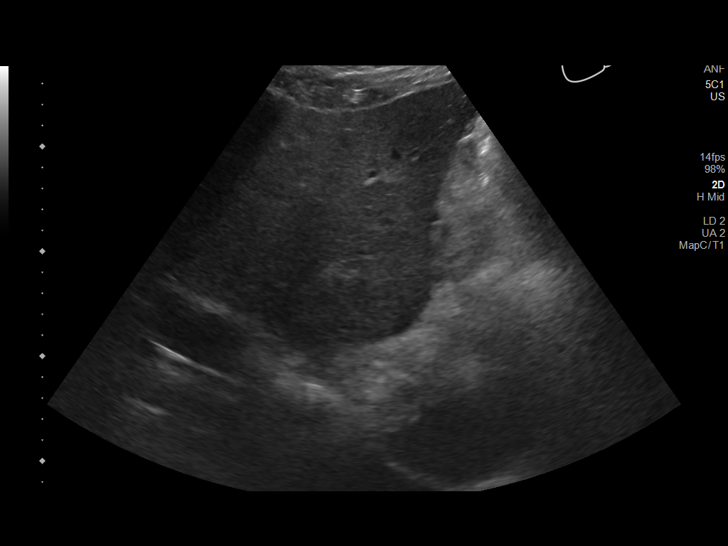
[im 25/55]
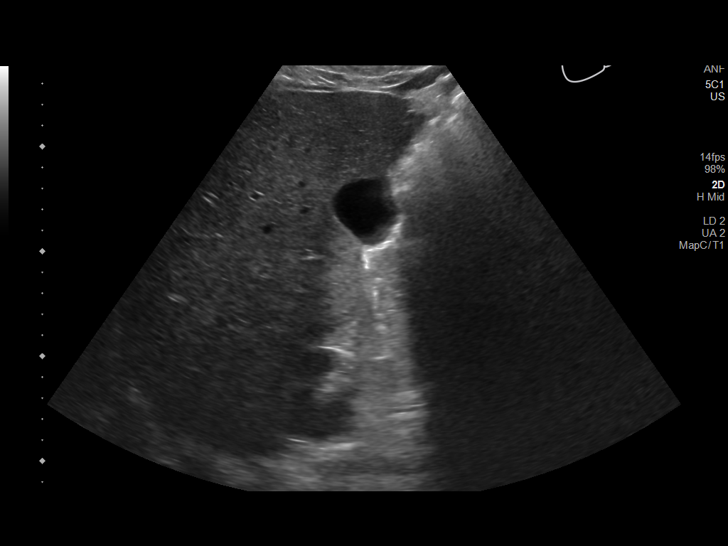
[im 30/55]
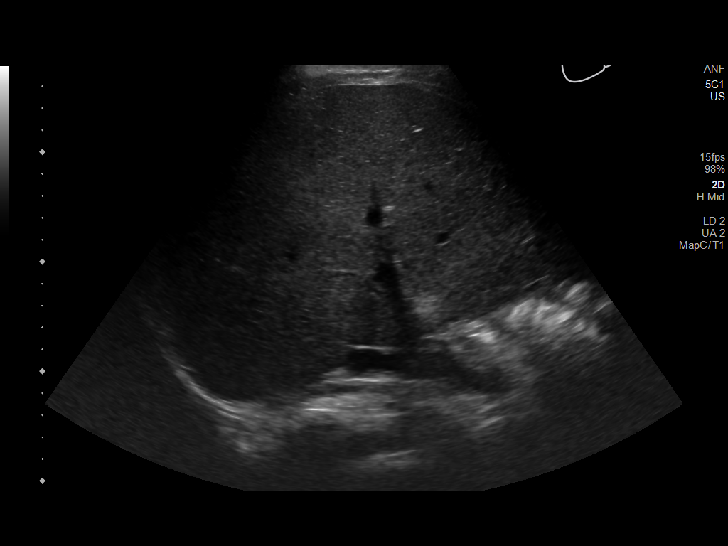
[im 34/55]
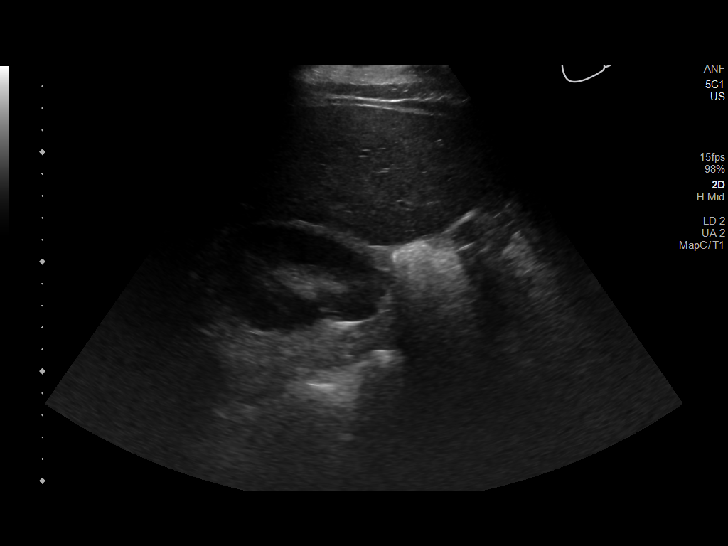
[im 37/55]
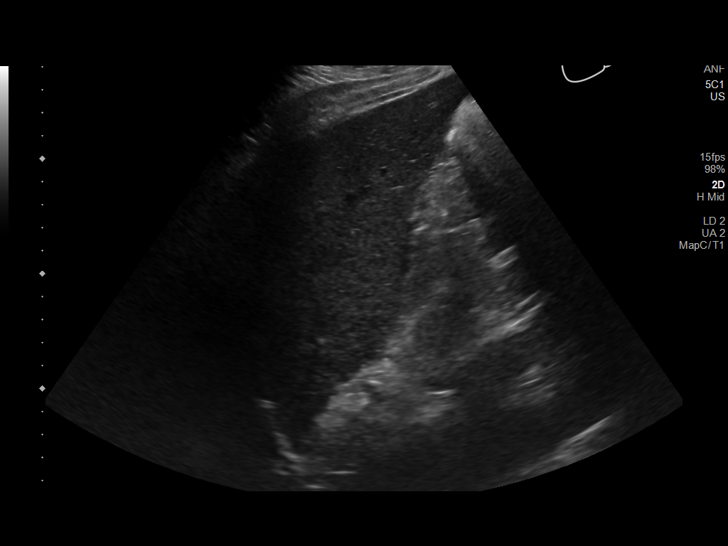
[im 41/55]
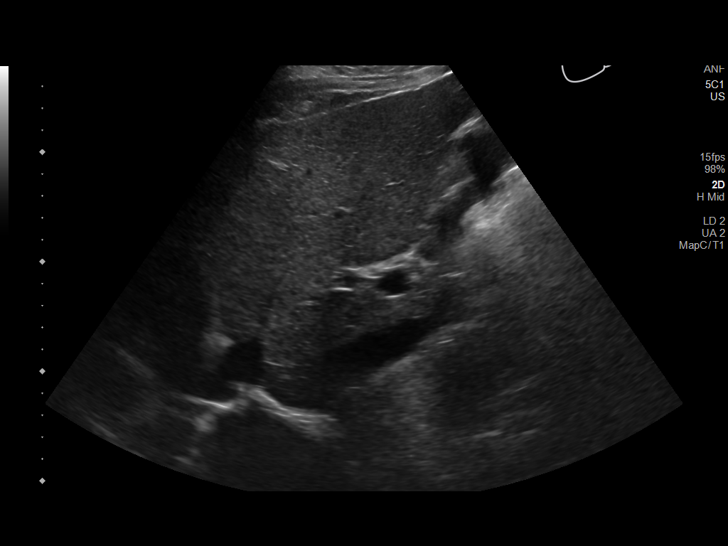
[im 46/55]
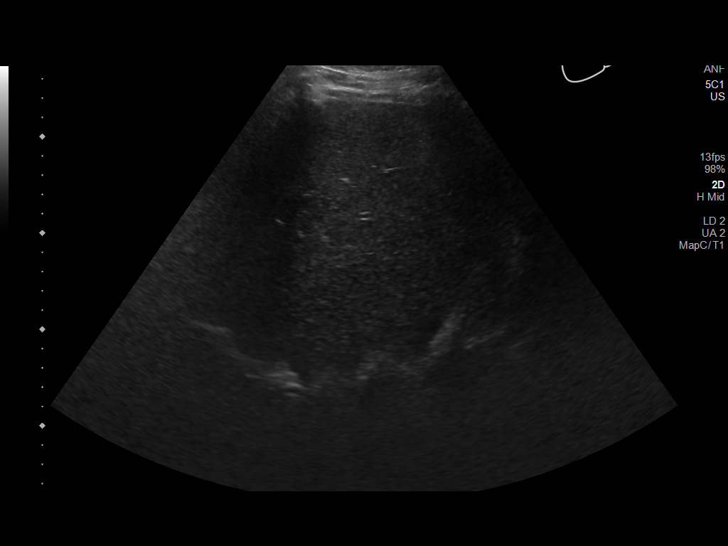
[im 50/55]
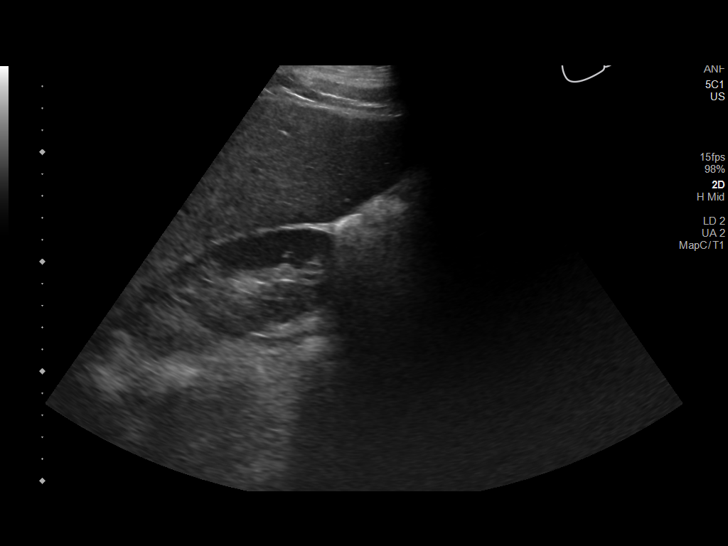
[im 55/55]
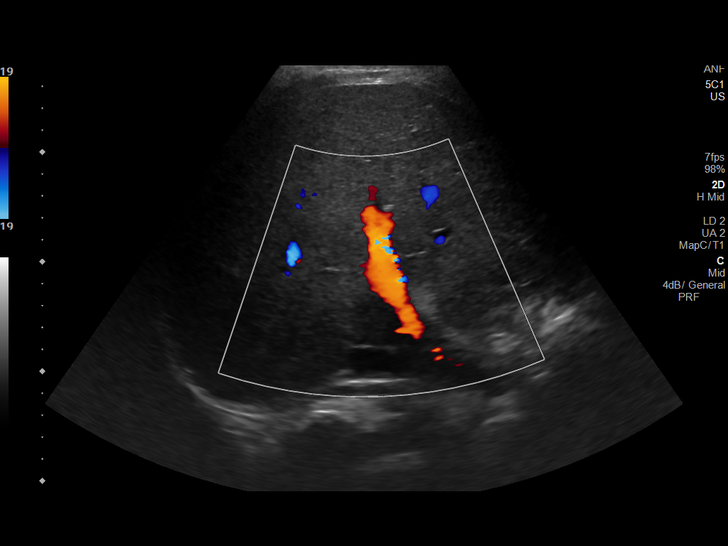

[14 of 25 positions shown; findings below may reference images not displayed]

FINDINGS: Gallbladder:

Small volume gallbladder sludge. No gallstones or wall thickening
visualized. No sonographic Murphy sign noted by sonographer.

Common bile duct:

Diameter: 2 mm

Liver:

No focal lesion identified. Within normal limits in parenchymal
echogenicity. Portal vein is patent on color Doppler imaging with
normal direction of blood flow towards the liver.
IMPRESSION: 1. Mild gallbladder sludge.
2. Otherwise normal.

## 2020-04-10 IMAGING — DX PORTABLE CHEST - 1 VIEW
1 series · 1 of 1 positions shown · non-contrast
Comparison: 09/15/2018 and earlier.

CLINICAL DATA: 17-year-old with current history of type 1 diabetes,
asthma and epilepsy, presenting with acute onset of mid chest pain
radiating into the LEFT arm that began yesterday morning.

EXAM:
PORTABLE CHEST 1 VIEW

[chest ap]
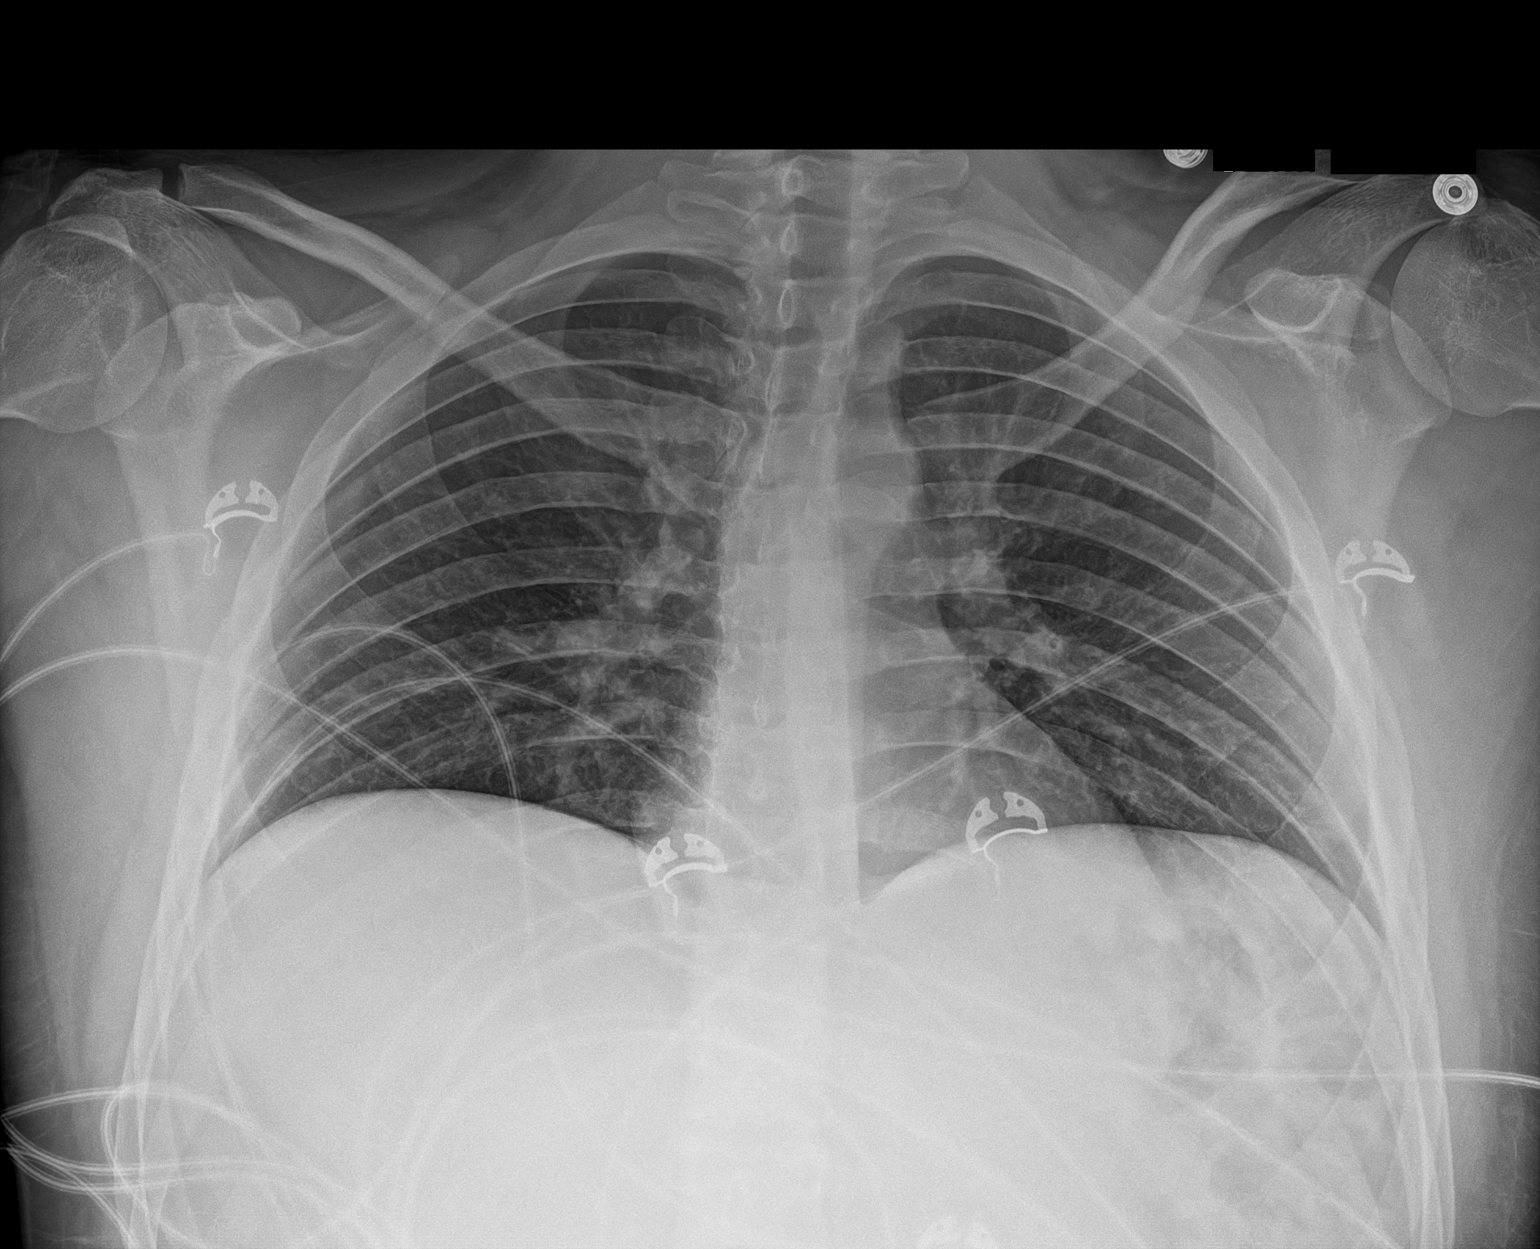

[1 of 1 positions shown; findings below may reference images not displayed]

FINDINGS: Suboptimal inspiration accounts for crowded bronchovascular
markings, especially in the bases, and accentuates the cardiac
silhouette. Taking this into account, cardiomediastinal silhouette
unremarkable. Lungs clear. Bronchovascular markings normal. No
pleural effusions.
IMPRESSION: Suboptimal inspiration. No acute cardiopulmonary disease.

## 2020-05-04 ENCOUNTER — Encounter (HOSPITAL_COMMUNITY): Payer: Self-pay

## 2020-05-04 ENCOUNTER — Other Ambulatory Visit: Payer: Self-pay

## 2020-05-04 ENCOUNTER — Ambulatory Visit (HOSPITAL_COMMUNITY)
Admission: EM | Admit: 2020-05-04 | Discharge: 2020-05-04 | Disposition: A | Discharge status: Home / Self Care | Payer: Medicaid Other | Attending: Nurse Practitioner | Admitting: Nurse Practitioner

## 2020-05-04 DIAGNOSIS — F12988 Cannabis use, unspecified with other cannabis-induced disorder: Secondary | ICD-10-CM | POA: Insufficient documentation

## 2020-05-04 DIAGNOSIS — Z20822 Contact with and (suspected) exposure to covid-19: Secondary | ICD-10-CM | POA: Insufficient documentation

## 2020-05-04 DIAGNOSIS — F17210 Nicotine dependence, cigarettes, uncomplicated: Secondary | ICD-10-CM | POA: Insufficient documentation

## 2020-05-04 DIAGNOSIS — F1594 Other stimulant use, unspecified with stimulant-induced mood disorder: Secondary | ICD-10-CM | POA: Insufficient documentation

## 2020-05-04 DIAGNOSIS — F319 Bipolar disorder, unspecified: Secondary | ICD-10-CM | POA: Insufficient documentation

## 2020-05-04 DIAGNOSIS — Z794 Long term (current) use of insulin: Secondary | ICD-10-CM | POA: Insufficient documentation

## 2020-05-04 DIAGNOSIS — Z91128 Patient's intentional underdosing of medication regimen for other reason: Secondary | ICD-10-CM | POA: Insufficient documentation

## 2020-05-04 DIAGNOSIS — E119 Type 2 diabetes mellitus without complications: Secondary | ICD-10-CM | POA: Insufficient documentation

## 2020-05-04 DIAGNOSIS — F19959 Other psychoactive substance use, unspecified with psychoactive substance-induced psychotic disorder, unspecified: Secondary | ICD-10-CM

## 2020-05-04 LAB — COMPREHENSIVE METABOLIC PANEL
ALT: 48 U/L — ABNORMAL HIGH (ref 0–44)
AST: 36 U/L (ref 15–41)
Albumin: 3.6 g/dL (ref 3.5–5.0)
Alkaline Phosphatase: 131 U/L — ABNORMAL HIGH (ref 38–126)
Anion gap: 18 — ABNORMAL HIGH (ref 5–15)
BUN: 19 mg/dL (ref 6–20)
CO2: 20 mmol/L — ABNORMAL LOW (ref 22–32)
Calcium: 9.6 mg/dL (ref 8.9–10.3)
Chloride: 95 mmol/L — ABNORMAL LOW (ref 98–111)
Creatinine, Ser: 0.98 mg/dL (ref 0.61–1.24)
GFR, Estimated: >60 mL/min (ref >60)
Glucose, Bld: 283 mg/dL — ABNORMAL HIGH (ref 70–99)
Potassium: 3.3 mmol/L — ABNORMAL LOW (ref 3.5–5.1)
Sodium: 133 mmol/L — ABNORMAL LOW (ref 135–145)
Total Bilirubin: 1 mg/dL (ref 0.3–1.2)
Total Protein: 7 g/dL (ref 6.5–8.1)

## 2020-05-04 LAB — CBC WITH DIFFERENTIAL/PLATELET
Abs Immature Granulocytes: 0.15 10*3/uL — ABNORMAL HIGH (ref 0.00–0.07)
Basophils Absolute: 0.1 10*3/uL (ref 0.0–0.1)
Basophils Relative: 1 %
Eosinophils Absolute: 0.1 10*3/uL (ref 0.0–0.5)
Eosinophils Relative: 2 %
HCT: 44.5 % (ref 39.0–52.0)
Hemoglobin: 15.6 g/dL (ref 13.0–17.0)
Immature Granulocytes: 2 %
Lymphocytes Relative: 32 %
Lymphs Abs: 2.6 10*3/uL (ref 0.7–4.0)
MCH: 28.6 pg (ref 26.0–34.0)
MCHC: 35.1 g/dL (ref 30.0–36.0)
MCV: 81.7 fL (ref 80.0–100.0)
Monocytes Absolute: 0.6 10*3/uL (ref 0.1–1.0)
Monocytes Relative: 7 %
Neutro Abs: 4.5 10*3/uL (ref 1.7–7.7)
Neutrophils Relative %: 56 %
Platelets: 367 10*3/uL (ref 150–400)
RBC: 5.45 MIL/uL (ref 4.22–5.81)
RDW: 11.9 % (ref 11.5–15.5)
WBC: 8.1 10*3/uL (ref 4.0–10.5)
nRBC: 0 % (ref 0.0–0.2)

## 2020-05-04 LAB — LIPID PANEL
Cholesterol: 204 mg/dL — ABNORMAL HIGH (ref 0–200)
HDL: 35 mg/dL — ABNORMAL LOW (ref >40)
LDL Cholesterol: UNDETERMINED mg/dL (ref 0–99)
Total CHOL/HDL Ratio: 5.8 RATIO
Triglycerides: 898 mg/dL — ABNORMAL HIGH (ref <150)
VLDL: UNDETERMINED mg/dL (ref 0–40)

## 2020-05-04 LAB — LDL CHOLESTEROL, DIRECT: Direct LDL: 70.7 mg/dL (ref 0–99)

## 2020-05-04 LAB — POCT URINE DRUG SCREEN - MANUAL ENTRY (I-SCREEN)
POC Amphetamine UR: POSITIVE — AB
POC Buprenorphine (BUP): NOT DETECTED
POC Cocaine UR: NOT DETECTED
POC Marijuana UR: POSITIVE — AB
POC Methadone UR: NOT DETECTED
POC Methamphetamine UR: POSITIVE — AB
POC Morphine: NOT DETECTED
POC Oxazepam (BZO): NOT DETECTED
POC Oxycodone UR: NOT DETECTED
POC Secobarbital (BAR): NOT DETECTED

## 2020-05-04 LAB — RESPIRATORY PANEL BY RT PCR (FLU A&B, COVID)
Influenza A by PCR: NEGATIVE
Influenza B by PCR: NEGATIVE
SARS Coronavirus 2 by RT PCR: NEGATIVE

## 2020-05-04 LAB — GLUCOSE, CAPILLARY: Glucose-Capillary: 235 mg/dL — ABNORMAL HIGH (ref 70–99)

## 2020-05-04 LAB — TSH: TSH: 2.165 u[IU]/mL (ref 0.350–4.500)

## 2020-05-04 MED ORDER — LORAZEPAM 2 MG/ML IJ SOLN
INTRAMUSCULAR | Status: AC
  Administered 2020-05-04: 2 mg via INTRAMUSCULAR
  Filled 2020-05-04: qty 1

## 2020-05-04 MED ORDER — DIPHENHYDRAMINE HCL 50 MG PO CAPS
50.0000 mg | ORAL_CAPSULE | Freq: Once | ORAL | Status: AC
  Administered 2020-05-04: 50 mg via ORAL
  Filled 2020-05-04: qty 1

## 2020-05-04 MED ORDER — ACETAMINOPHEN 325 MG PO TABS: 650.0000 mg | ORAL_TABLET | Freq: Four times a day (QID) | ORAL | Status: DC | PRN

## 2020-05-04 MED ORDER — ALBUTEROL SULFATE HFA 108 (90 BASE) MCG/ACT IN AERS: 1.0000 | INHALATION_SPRAY | RESPIRATORY_TRACT | Status: DC | PRN

## 2020-05-04 MED ORDER — HALOPERIDOL 5 MG PO TABS
10.0000 mg | ORAL_TABLET | Freq: Once | ORAL | Status: AC
  Administered 2020-05-04: 10 mg via ORAL
  Filled 2020-05-04: qty 2

## 2020-05-04 MED ORDER — FLUTICASONE PROPIONATE HFA 110 MCG/ACT IN AERO: 2.0000 | INHALATION_SPRAY | Freq: Two times a day (BID) | RESPIRATORY_TRACT | Status: DC

## 2020-05-04 MED ORDER — INSULIN ASPART 100 UNIT/ML ~~LOC~~ SOLN: 0.0000 [IU] | Freq: Three times a day (TID) | SUBCUTANEOUS | Status: DC

## 2020-05-04 MED ORDER — MAGNESIUM HYDROXIDE 400 MG/5ML PO SUSP: 30.0000 mL | Freq: Every day | ORAL | Status: DC | PRN

## 2020-05-04 MED ORDER — LORAZEPAM 2 MG/ML IJ SOLN: 2.0000 mg | Freq: Once | INTRAMUSCULAR | Status: AC

## 2020-05-04 MED ORDER — ALUM & MAG HYDROXIDE-SIMETH 200-200-20 MG/5ML PO SUSP: 30.0000 mL | ORAL | Status: DC | PRN

## 2020-05-04 MED ORDER — INSULIN GLARGINE 100 UNIT/ML ~~LOC~~ SOLN: 30.0000 [IU] | Freq: Two times a day (BID) | SUBCUTANEOUS | Status: DC

## 2020-05-04 MED ORDER — HYDROXYZINE HCL 25 MG PO TABS: 25.0000 mg | ORAL_TABLET | Freq: Three times a day (TID) | ORAL | Status: DC | PRN

## 2020-05-04 MED ORDER — LORAZEPAM 1 MG PO TABS
1.0000 mg | ORAL_TABLET | Freq: Once | ORAL | Status: AC
  Administered 2020-05-04: 1 mg via ORAL
  Filled 2020-05-04: qty 1

## 2020-05-04 NOTE — ED Notes (Signed)
Pt has eaten sandwich, drank juice and soda. Pt has bizarre behavior. Hands are shaky and cannot focus. He is restless constantly tossing and turning while laying in bed.  while he is sitting up he will stare off. Will continue to monitor pt for his safety

## 2020-05-04 NOTE — ED Triage Notes (Addendum)
Pt arrives as walk in, visibly altered and minimally responsive to questions. VSS & blood sugar elevated, initially assessed in lobby by RN & NP. Pt reports hearing voices. Redness of unknown origin to R knuckles, pt states he does not know what from. Pt does not respond to further triage questions including assessing for SI & HI.

## 2020-05-04 NOTE — ED Notes (Signed)
Pt pacing back and fourth from bathroom to the entrance door to th unit. Pt is being monitored due to bizarre behavior

## 2020-05-04 NOTE — BH Assessment (Signed)
Comprehensive Clinical Assessment (CCA) Screening, Triage and Referral Note  05/04/2020 [name] [name]. [id number]  [name] [name] is a 19 year old male who presents to [facility] voluntarily for drug use and hallucinations. During assessment pt is non-responsive, pupils are dilated, no eye contact. During assessment pt  knuckles on his right hand are reddened. States that he has not injured his hand. States that he does not know how long the redness has been present. He denies any pain. 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 He has been assigned to the PSIACTteam but is poorly compliant with their visits and recommendations. Pt states he is not taking psych medications but is taking medication for diabetes. Pt currently denies SI,HI, does not admit to drug abuse but labs show positive for meth at this time per [facility] nurse.admits to [facility] states that he is hearing voices. Responds that he can understand the voices but does not respond when asked to describe the voices. Patient will respond appropriately at times. He appears to have some thought blocking.  Pt states he would like to stay overnight and take medications for psychosis.     Diagnosis: Substance Induced psychotic disorder  Disposition: [name], PMHNP, recommends pt for overnight observation, at [facility]  Chief Complaint:  Chief Complaint  Patient presents with  . Hallucinations   Visit Diagnosis: drug use and hallucinations.  Patient Reported Information How did you hear about [place]? Self   Referral name:  Self (Self)   Referral phone number: No data recorded Whom do you see for routine medical problems? I don't have a doctor   Practice/Facility Name: [facility]   Practice/Facility Phone Number: No data recorded  Name of Contact: No data recorded  Contact Number: No data recorded  Contact Fax Number: No data recorded  Prescriber Name: No data recorded  Prescriber Address (if known): No data recorded What Is the Reason for Your Visit/Call Today? hallucinations and drug use (hallucinations and drug use)  How Long Has This Been Causing You Problems? <Week  Have You Recently Been in Any Inpatient Treatment (Hospital/Detox/Crisis Center/28-Day Program)? No   Name/Location of Program/Hospital:No data recorded  How Long Were You There? No data recorded  When Were You Discharged? No data recorded Have You Ever Received Services From [facility] Before? No   Who Do You See at [facility]? medical  Have You Recently Had Any Thoughts About Hurting Yourself? No   Are You Planning to Commit Suicide/Harm Yourself At This time?  No  Have you Recently Had Thoughts About Hurting Someone [name] [name]? No   Explanation: No data recorded Have You Used Any Alcohol or Drugs in the Past 24 Hours? Yes   How Long Ago Did You Use Drugs or Alcohol?  1600   What Did You Use and How Much? Marijuana and Meth (Marijuana and Meth)  What Do You Feel Would Help You the Most Today? Assessment Only  Do You Currently Have a Therapist/Psychiatrist? Yes   Name of Therapist/Psychiatrist: [facility]   Have You Been Recently Discharged From Any Office Practice or Programs? No   Explanation of Discharge From Practice/Program:  No  data recorded    CCA Screening Triage Referral Assessment Type of Contact: Face-to-Face   Is this Initial or Reassessment? [place]  Date Telepsych consult ordered in [facility]:  05/04/20  Time Telepsych consult ordered in [facility]:   Patient Reported Information Reviewed? Yes   Patient  Left Without Being Seen? No data recorded  Reason for Not Completing Assessment: No data recorded Collateral Involvement: no (no)  Does Patient Have a Court Appointed Legal Guardian? No data recorded  Name and Contact of Legal Guardian:  Self  If Minor and Not Living with Parent(s), Who has Custody? NA  Is CPS involved or ever been involved? Never  Is APS involved or ever been involved? Never  Patient Determined To Be At Risk for Harm To Self or Others Based on Review of Patient Reported Information or Presenting Complaint? No   Method: No data recorded  Availability of Means: No data recorded  Intent: No data recorded  Notification Required: No data recorded  Additional Information for Danger to Others Potential:  No data recorded  Additional Comments for Danger to Others Potential:  No data recorded  Are There Guns or Other Weapons in Your Home?  No data recorded   Types of Guns/Weapons: No data recorded   Are These Weapons Safely Secured?                              No data recorded   Who Could Verify You Are Able To Have These Secured:    No data recorded Do You Have any Outstanding Charges, Pending Court Dates, Parole/Probation? No data recorded Contacted To Inform of Risk of Harm To Self or Others: No data recorded Location of Assessment: [facility]  Does Patient Present under Involuntary Commitment? No   IVC Papers Initial File Date: No data recorded  [place] of Residence: [place]  Patient Currently Receiving the Following Services: ACTT Psychologist, educational)   Determination of Need: Urgent (48 hours)   Options For Referral: Overnight Observation  Natasha [name], LCSWA

## 2020-05-04 NOTE — ED Notes (Signed)
Pt continued pacing and demanding to leave. When asked if someone could come get him pt reported he had no one. When asked if his wife could pt refused to answer. Pt again educated on effects of ativan and that staff was concerned for his safety after receiving injection. Behaviors continued.  After further conversation with [name] NP, who assessed pt face to face and was also verbally threatened with physical harm it was agreed to allow pt to leave. Sheriff on to unit to assist in deescalation. After being told he would be allowed to leave without waiting pt stopped with verbal and physical aggression. Pt was given AMA paper to sign, walked to locker to retreive belongings and escorted to front entrance.

## 2020-05-04 NOTE — ED Notes (Signed)
Pt is standing against wall staring off into unit with angry look on his face

## 2020-05-04 NOTE — ED Provider Notes (Signed)
Behavioral Health Admission H&P [facility] & OBS)  Date: 05/04/20 Patient Name: [name] [name]. MRN: [id number] Chief Complaint:  Chief Complaint  Patient presents with  . Hallucinations      Diagnoses:  Final diagnoses:  Substance-induced psychotic disorder [facility])    HPI: [name] is a 19 y.o. male with a history of Bipolar Disorder, Type I DM, and Methamphetamine use who presents to [facility] voluntarily. Patient reports that he lives nearby and walked to [facility]. He is diaphoretic. CBG 235, B/P 136/97 HR 116. He is alert and oriented x 3. He states that he is hearing voices. Responds that he can understand the voices but does not respond when asked to describe the voices. Patient will respond appropriately at times. He appears to have some thought blocking.  Patient mumbles to himself at times. He denies suicidal ideations. He denies homicidal ideations. When reviewing medications, he states "I haven't been taking any of my mental health medicines." Patient reports that he has been taking his insulin as prescribed. Patient's knuckles on his right hand are reddened. Has the appearance of some type of dye in this providers opinion. States that he has not injured his hand. States that he does not know how long the redness has been present. He denies any pain. Denies tenderness upon palpation. ROM without limitation. No edema. Patient is noted opening/closing and staring at his hand. Patient later reported that he used fake blood on halloween and has been scrubbing his hands. Initially patient denied substance use, he later reports that he bought some marijuana that he thinks was laced.  UDS positive for methamphetamine and marijuana. He states that he is followed by [facility] but he does not recall the last time that he saw his ACT Team.  From previous [facility] inpatient assessment: The patient has an extensive past psychiatric history to include seizures versus nonepileptic seizures, substance abuse  involving methamphetamine dependency for strtches of time, borderline personality disorder, antisocial behaviors and possible personality disorder, poor compliance with psychotropic meds and diabetes medication leading to DKA.  He has a history of leaving AMA when being evaluated, he is made numerous and multiple threats to harm himself and multiple self-harm gestures in the past. He has been assigned to the [facility] team but is poorly compliant with their visits and recommendations.  PHQ 2-9:     ED from 01/28/2020 in [facility] EMERGENCY DEPARTMENT Admission (Discharged) from OP [facility] from 11/10/2018 in [facility]  C-SSRS RISK CATEGORY No Risk High Risk Error: Question 2 not populated       Total Time spent with patient: 20 minutes  Musculoskeletal  Strength & Muscle Tone: within normal limits Gait & Station: normal Patient leans: N/A  Psychiatric Specialty Exam  Presentation General Appearance: Neat  Eye Contact:Fleeting  Speech:Clear and Coherent  Speech Volume:Decreased  Handedness:Right   Mood and Affect  Mood:Dysphoric  Affect:Depressed   Thought Process  Thought Processes:Coherent  Descriptions of Associations:Intact  Orientation:Full (Time, Place and Person)  Thought Content:Logical  Hallucinations:Hallucinations: Auditory Description of Auditory Hallucinations: reports hearing voices  Ideas of Reference:None  Suicidal Thoughts:Suicidal Thoughts: No  Homicidal Thoughts:Homicidal Thoughts: No   Sensorium  Memory:Immediate Fair;Recent Fair;Remote Fair  Judgment:Impaired  Insight:Lacking   Executive Functions  Concentration:Fair  Attention Span:Fair  Recall:Fair  Fund of Knowledge:Fair  Language:Fair   Psychomotor Activity  Psychomotor Activity:Psychomotor Activity: Normal   Assets  Assets:Desire  for  Improvement;Financial Resources/Insurance;Housing;Social Support   Sleep  Sleep:Sleep: Fair   Physical Exam Constitutional:      General: He is not in acute distress.    Appearance: He is not ill-appearing, toxic-appearing or diaphoretic.  HENT:     Head: Normocephalic.     Right Ear: External ear normal.     Left Ear: External ear normal.  Eyes:     Pupils: Pupils are equal, round, and reactive to light.  Cardiovascular:     Rate and Rhythm: Normal rate.  Pulmonary:     Effort: Pulmonary effort is normal. No respiratory distress.  Musculoskeletal:        General: Normal range of motion.  Skin:    General: Skin is warm and dry.  Neurological:     Mental Status: He is alert and oriented to person, place, and time.  Psychiatric:        Mood and Affect: Mood is anxious and depressed.        Speech: Speech normal.        Behavior: Behavior is cooperative.        Thought Content: Thought content does not include homicidal or suicidal ideation. Thought content does not include suicidal plan.    Review of Systems  Constitutional: Negative for chills, fever, malaise/fatigue and weight loss.  Eyes: Negative for blurred vision.  Respiratory: Negative for cough and shortness of breath.   Cardiovascular: Negative for chest pain and palpitations.  Gastrointestinal: Negative for diarrhea, nausea and vomiting.  Neurological: Negative for dizziness.  Psychiatric/Behavioral: Positive for depression, hallucinations and substance abuse. Negative for suicidal ideas. The patient is nervous/anxious and has insomnia.     Blood pressure (!) 136/97, pulse (!) 116, temperature 97.9 F (36.6 C), temperature source Temporal, resp. rate (!) 22, SpO2 99 %. There is no height or weight on file to calculate BMI.  Past Psychiatric History:   Is the patient at risk to self? No  Has the patient been a risk to self in the past 6 months? No .    Has the patient been a risk to self within the distant  past? Yes   Is the patient a risk to others? No   Has the patient been a risk to others in the past 6 months? No   Has the patient been a risk to others within the distant past? No   Past Medical History:  Past Medical History:  Diagnosis Date  . ADD (attention deficit disorder)   . Allergy   . Anxiety   . Asthma   . Depression   . Epileptic seizure (HCC)    "both petit and grand mal; last sz ~ 2 wk ago" (06/17/2013)  . Headache(784.0)    "2-3 times/wk usually" (06/17/2013)  . Heart murmur    "heard a slight one earlier today" (06/17/2013)  . Homeless   . Migraine    "maybe once/month; it's severe" (06/17/2013)  . ODD (oppositional defiant disorder)   . Sickle cell trait (HCC)   . Type I diabetes mellitus (HCC)    "that's what they're thinking now" (06/17/2013)  . Vision abnormalities    "takes him longer to focus cause; from his sz" (06/17/2013)    Past Surgical History:  Procedure Laterality Date  . CIRCUMCISION  2000  . FINGER SURGERY Left 2001   "crushed pinky; had to repair it" (06/17/2013)    Family History:  Family History  Problem Relation Age of Onset  . Asthma Mother   .  COPD Mother   . Other Mother        possible autoimmune, unclear  . Diabetes Maternal Grandmother   . Heart disease Maternal Grandmother   . Hypertension Maternal Grandmother   . Mental illness Maternal Grandmother   . Heart disease Maternal Grandfather   . Hyperlipidemia Paternal Grandmother   . Hyperlipidemia Paternal Grandfather   . Migraines Sister        Hemiplegic Migraines     Social History:  Social History   Socioeconomic History  . Marital status: Significant Other    Spouse name: Not on file  . Number of children: 1  . Years of education: Not on file  . Highest education level: Not on file  Occupational History  . Occupation: Unemployed  Tobacco Use  . Smoking status: Current Every Day Smoker    Packs/day: 0.50    Types: Cigarettes  . Smokeless tobacco: Never  Used  . Tobacco comment: Mom and dad smoke outside   Vaping Use  . Vaping Use: Former  Substance and Sexual Activity  . Alcohol use: No    Alcohol/week: 0.0 standard drinks  . Drug use: Yes    Frequency: 7.0 times per week    Types: Marijuana, Methamphetamines  . Sexual activity: Yes  Other Topics Concern  . Not on file  Social History Narrative   Lives with significant other/fiancee; followed by [facility]   Social Determinants of Health   Financial Resource Strain:   . Difficulty of Paying Living Expenses: Not on file  Food Insecurity:   . Worried About Programme researcher, broadcasting/film/video in the Last Year: Not on file  . Ran Out of Food in the Last Year: Not on file  Transportation Needs:   . Lack of Transportation (Medical): Not on file  . Lack of Transportation (Non-Medical): Not on file  Physical Activity:   . Days of Exercise per Week: Not on file  . Minutes of Exercise per Session: Not on file  Stress:   . Feeling of Stress : Not on file  Social Connections:   . Frequency of Communication with Friends and Family: Not on file  . Frequency of Social Gatherings with Friends and Family: Not on file  . Attends Religious Services: Not on file  . Active Member of Clubs or Organizations: Not on file  . Attends Banker Meetings: Not on file  . Marital Status: Not on file  Intimate Partner Violence:   . Fear of Current or Ex-Partner: Not on file  . Emotionally Abused: Not on file  . Physically Abused: Not on file  . Sexually Abused: Not on file    SDOH:  SDOH Screenings   Alcohol Screen: Low Risk   . Last Alcohol Screening Score (AUDIT): 0  Depression (PHQ2-9):   . PHQ-2 Score: Not on file  Financial Resource Strain:   . Difficulty of Paying Living Expenses: Not on file  Food Insecurity:   . Worried About Programme researcher, broadcasting/film/video in the Last Year: Not on file  . Ran Out of Food in the Last Year: Not on file  Housing:   . Last Housing Risk Score: Not on file  Physical  Activity:   . Days of Exercise per Week: Not on file  . Minutes of Exercise per Session: Not on file  Social Connections:   . Frequency of Communication with Friends and Family: Not on file  . Frequency of Social Gatherings with Friends and Family: Not on file  .  Attends Religious Services: Not on file  . Active Member of Clubs or Organizations: Not on file  . Attends Banker Meetings: Not on file  . Marital Status: Not on file  Stress:   . Feeling of Stress : Not on file  Tobacco Use: High Risk  . Smoking Tobacco Use: Current Every Day Smoker  . Smokeless Tobacco Use: Never Used  Transportation Needs:   . Freight forwarder (Medical): Not on file  . Lack of Transportation (Non-Medical): Not on file    Last Labs:  Admission on 05/04/2020  Component Date Value Ref Range Status  . Glucose-Capillary 05/04/2020 235* 70 - 99 mg/dL Final   Glucose reference range applies only to samples taken after fasting for at least 8 hours.  Marland Kitchen POC Amphetamine UR 05/04/2020 Positive* None Detected Final  . POC Secobarbital (BAR) 05/04/2020 None Detected  None Detected Final  . POC Buprenorphine (BUP) 05/04/2020 None Detected  None Detected Final  . POC Oxazepam (BZO) 05/04/2020 None Detected  None Detected Final  . POC Cocaine UR 05/04/2020 None Detected  None Detected Final  . POC Methamphetamine UR 05/04/2020 Positive* None Detected Final  . POC Morphine 05/04/2020 None Detected  None Detected Final  . POC Oxycodone UR 05/04/2020 None Detected  None Detected Final  . POC Methadone UR 05/04/2020 None Detected  None Detected Final  . POC Marijuana UR 05/04/2020 Positive* None Detected Final  Admission on 02/18/2020, Discharged on 02/19/2020  Component Date Value Ref Range Status  . WBC 02/17/2020 14.5* 4.0 - 10.5 K/uL Final  . RBC 02/17/2020 5.88* 4.22 - 5.81 MIL/uL Final  . Hemoglobin 02/17/2020 16.0  13.0 - 17.0 g/dL Final  . HCT 16/03/9603 48.6  39 - 52 % Final  . MCV  02/17/2020 82.7  80.0 - 100.0 fL Final  . MCH 02/17/2020 27.2  26.0 - 34.0 pg Final  . MCHC 02/17/2020 32.9  30.0 - 36.0 g/dL Final  . RDW 54/02/8118 12.1  11.5 - 15.5 % Final  . Platelets 02/17/2020 515* 150 - 400 K/uL Final  . nRBC 02/17/2020 0.0  0.0 - 0.2 % Final  . Neutrophils Relative % 02/17/2020 76  % Final  . Neutro Abs 02/17/2020 11.0* 1.7 - 7.7 K/uL Final  . Lymphocytes Relative 02/17/2020 14  % Final  . Lymphs Abs 02/17/2020 2.0  0.7 - 4.0 K/uL Final  . Monocytes Relative 02/17/2020 7  % Final  . Monocytes Absolute 02/17/2020 1.1* 0.1 - 1.0 K/uL Final  . Eosinophils Relative 02/17/2020 0  % Final  . Eosinophils Absolute 02/17/2020 0.0  0.0 - 0.5 K/uL Final  . Basophils Relative 02/17/2020 1  % Final  . Basophils Absolute 02/17/2020 0.1  0.0 - 0.1 K/uL Final  . Immature Granulocytes 02/17/2020 2  % Final  . Abs Immature Granulocytes 02/17/2020 0.32* 0.00 - 0.07 K/uL Final   Performed at [facility], [address], [place], [place] [address]  . Sodium 02/17/2020 138  135 - 145 mmol/L Final  . Potassium 02/17/2020 4.1  3.5 - 5.1 mmol/L Final  . Chloride 02/17/2020 102  98 - 111 mmol/L Final  . CO2 02/17/2020 13* 22 - 32 mmol/L Final  . Glucose, Bld 02/17/2020 238* 70 - 99 mg/dL Final   Glucose reference range applies only to samples taken after fasting for at least 8 hours.  . BUN 02/17/2020 9  6 - 20 mg/dL Final  . Creatinine, Ser 02/17/2020 0.96  0.61 - 1.24 mg/dL  Final  . Calcium 02/17/2020 9.9  8.9 - 10.3 mg/dL Final  . Total Protein 02/17/2020 8.2* 6.5 - 8.1 g/dL Final  . Albumin 60/45/4098 4.0  3.5 - 5.0 g/dL Final  . AST 11/91/4782 19  15 - 41 U/L Final  . ALT 02/17/2020 29  0 - 44 U/L Final  . Alkaline Phosphatase 02/17/2020 100  38 - 126 U/L Final  . Total Bilirubin 02/17/2020 1.7* 0.3 - 1.2 mg/dL Final  . GFR calc non Af Amer 02/17/2020 >60  >60 mL/min Final  . GFR calc Af Amer 02/17/2020 >60  >60 mL/min Final  . Anion gap 02/17/2020 23* 5 -  15 Final   Performed at [facility], [address] [place], [place] [address]  . Glucose-Capillary 02/17/2020 233* 70 - 99 mg/dL Final   Glucose reference range applies only to samples taken after fasting for at least 8 hours.  . Glucose-Capillary 02/18/2020 248* 70 - 99 mg/dL Final   Glucose reference range applies only to samples taken after fasting for at least 8 hours.  . Color, Urine 02/18/2020 STRAW* YELLOW Final  . APPearance 02/18/2020 CLEAR  CLEAR Final  . Specific Gravity, Urine 02/18/2020 1.018  1.005 - 1.030 Final  . pH 02/18/2020 5.0  5.0 - 8.0 Final  . Glucose, UA 02/18/2020 >=500* NEGATIVE mg/dL Final  . Hgb urine dipstick 02/18/2020 NEGATIVE  NEGATIVE Final  . Bilirubin Urine 02/18/2020 NEGATIVE  NEGATIVE Final  . Ketones, ur 02/18/2020 80* NEGATIVE mg/dL Final  . Protein, ur 30/86/5784 NEGATIVE  NEGATIVE mg/dL Final  . Nitrite 69/62/9528 NEGATIVE  NEGATIVE Final  . Glori Luis 02/18/2020 NEGATIVE  NEGATIVE Final  . RBC / HPF 02/18/2020 0-5  0 - 5 RBC/hpf Final  . WBC, UA 02/18/2020 0-5  0 - 5 WBC/hpf Final  . Bacteria, UA 02/18/2020 NONE SEEN  NONE SEEN Final   Performed at [facility], [address], [place], [place] [address]  . pH, Ven 02/18/2020 7.208* 7.25 - 7.43 Final  . pCO2, Ven 02/18/2020 39.0* 44 - 60 mmHg Final  . pO2, Ven 02/18/2020 23.6* 32 - 45 mmHg Final   Comment: CRITICAL RESULT CALLED TO, READ BACK BY AND VERIFIED WITH: S,DOSTER AT 0442 ON 02/18/20 BY A,MOHAMED   . Bicarbonate 02/18/2020 15.0* 20.0 - 28.0 mmol/L Final  . Acid-base deficit 02/18/2020 12.3* 0.0 - 2.0 mmol/L Final  . O2 Saturation 02/18/2020 28.8  % Final  . Patient temperature 02/18/2020 98.6   Final   Performed at [facility], [address] [place], [place] [address]  . Glucose-Capillary 02/18/2020 223* 70 - 99 mg/dL Final   Glucose reference range applies only to samples taken after fasting for at least 8 hours.  .  Glucose-Capillary 02/18/2020 312* 70 - 99 mg/dL Final   Glucose reference range applies only to samples taken after fasting for at least 8 hours.  . Sodium 02/18/2020 137  135 - 145 mmol/L Final  . Potassium 02/18/2020 4.6  3.5 - 5.1 mmol/L Final  . Chloride 02/18/2020 106  98 - 111 mmol/L Final  . CO2 02/18/2020 14* 22 - 32 mmol/L Final  . Glucose, Bld 02/18/2020 340* 70 - 99 mg/dL Final   Glucose reference range applies only to samples taken after fasting for at least 8 hours.  . BUN 02/18/2020 8  6 - 20 mg/dL Final  . Creatinine, Ser 02/18/2020 1.01  0.61 - 1.24 mg/dL Final  . Calcium 72/53/6644 7.8* 8.9 - 10.3  mg/dL Final   DELTA CHECK NOTED  . GFR calc non Af Amer 02/18/2020 >60  >60 mL/min Final  . GFR calc Af Amer 02/18/2020 >60  >60 mL/min Final  . Anion gap 02/18/2020 17* 5 - 15 Final   Performed at [facility], [address], [place], [place] [address]  . Sodium 02/18/2020 138  135 - 145 mmol/L Final  . Potassium 02/18/2020 4.0  3.5 - 5.1 mmol/L Final  . Chloride 02/18/2020 109  98 - 111 mmol/L Final  . CO2 02/18/2020 19* 22 - 32 mmol/L Final  . Glucose, Bld 02/18/2020 179* 70 - 99 mg/dL Final   Glucose reference range applies only to samples taken after fasting for at least 8 hours.  . BUN 02/18/2020 5* 6 - 20 mg/dL Final  . Creatinine, Ser 02/18/2020 0.68  0.61 - 1.24 mg/dL Final  . Calcium 19/14/7829 8.2* 8.9 - 10.3 mg/dL Final  . GFR calc non Af Amer 02/18/2020 >60  >60 mL/min Final  . GFR calc Af Amer 02/18/2020 >60  >60 mL/min Final  . Anion gap 02/18/2020 10  5 - 15 Final   Performed at [facility], [address], [place], [place] [address]  . Sodium 02/18/2020 138  135 - 145 mmol/L Final  . Potassium 02/18/2020 3.8  3.5 - 5.1 mmol/L Final  . Chloride 02/18/2020 106  98 - 111 mmol/L Final  . CO2 02/18/2020 20* 22 - 32 mmol/L Final  . Glucose, Bld 02/18/2020 244* 70 - 99 mg/dL Final   Glucose reference range applies only to  samples taken after fasting for at least 8 hours.  . BUN 02/18/2020 <5* 6 - 20 mg/dL Final  . Creatinine, Ser 02/18/2020 0.64  0.61 - 1.24 mg/dL Final  . Calcium 08/65/7846 8.1* 8.9 - 10.3 mg/dL Final  . GFR calc non Af Amer 02/18/2020 >60  >60 mL/min Final  . GFR calc Af Amer 02/18/2020 >60  >60 mL/min Final  . Anion gap 02/18/2020 12  5 - 15 Final   Performed at [facility], [address], [place], [place] [address]  . Sodium 02/18/2020 142  135 - 145 mmol/L Final  . Potassium 02/18/2020 3.6  3.5 - 5.1 mmol/L Final  . Chloride 02/18/2020 107  98 - 111 mmol/L Final  . CO2 02/18/2020 24  22 - 32 mmol/L Final  . Glucose, Bld 02/18/2020 145* 70 - 99 mg/dL Final   Glucose reference range applies only to samples taken after fasting for at least 8 hours.  . BUN 02/18/2020 5* 6 - 20 mg/dL Final  . Creatinine, Ser 02/18/2020 0.56* 0.61 - 1.24 mg/dL Final  . Calcium 28/41/3244 8.6* 8.9 - 10.3 mg/dL Final  . GFR calc non Af Amer 02/18/2020 >60  >60 mL/min Final  . GFR calc Af Amer 02/18/2020 >60  >60 mL/min Final  . Anion gap 02/18/2020 11  5 - 15 Final   Performed at [facility], [address] [place], [place] [address]  . Sodium 02/18/2020 138  135 - 145 mmol/L Final  . Potassium 02/18/2020 3.6  3.5 - 5.1 mmol/L Final  . Chloride 02/18/2020 103  98 - 111 mmol/L Final  . CO2 02/18/2020 25  22 - 32 mmol/L Final  . Glucose, Bld 02/18/2020 213* 70 - 99 mg/dL Final   Glucose reference range applies only to samples taken after fasting for at least 8 hours.  . BUN 02/18/2020 <5* 6 - 20  mg/dL Final  . Creatinine, Ser 02/18/2020 0.53* 0.61 - 1.24 mg/dL Final  . Calcium 16/03/9603 8.3* 8.9 - 10.3 mg/dL Final  . GFR calc non Af Amer 02/18/2020 >60  >60 mL/min Final  . GFR calc Af Amer 02/18/2020 >60  >60 mL/min Final  . Anion gap 02/18/2020 10  5 - 15 Final   Performed at [facility], [address], [place], [place] [address]  .  Beta-Hydroxybutyric Acid 02/18/2020 5.35* 0.05 - 0.27 mmol/L Final   Comment: RESULTS CONFIRMED BY MANUAL DILUTION Performed at [facility], [address], [place], [place] [address]   . Beta-Hydroxybutyric Acid 02/18/2020 2.39* 0.05 - 0.27 mmol/L Final   Performed at [facility], [address], [place], [place] [address]  . Glucose-Capillary 02/18/2020 320* 70 - 99 mg/dL Final   Glucose reference range applies only to samples taken after fasting for at least 8 hours.  Marland Kitchen SARS Coronavirus 2 02/18/2020 POSITIVE* NEGATIVE Final   Comment: RESULT CALLED TO, READ BACK BY AND VERIFIED WITH: S.CLAPP,RN [id number] @1303  BY V.WILKINS (NOTE) SARS-CoV-2 target nucleic acids are DETECTED  SARS-CoV-2 RNA is generally detectable in upper respiratory specimens  during the acute phase of infection.  Positive results are indicative  of the presence of the identified virus, but do not rule out bacterial infection or co-infection with other pathogens not detected by the test.  Clinical correlation with patient history and  other diagnostic information is necessary to determine patient infection status.  The expected result is negative.  Fact Sheet for Patients:   BoilerBrush.com.cy   Fact Sheet for Healthcare Providers:   https://pope.com/    This test is not yet approved or cleared by the [place] FDA and  has been authorized for detection and/or diagnosis of SARS-CoV-2 by FDA under an Emergency Use Authorization (EUA).  This EUA will remain in effect (meaning this                           test can be used) for the duration of  the COVID-19 declaration under Section 564(b)(1) of the Act, 21 U.S.C. section 360-bbb-3(b)(1), unless the authorization is terminated or revoked sooner.  Performed at [facility], [address] [place], [place] [address]   . Glucose-Capillary 02/18/2020 261*  70 - 99 mg/dL Final   Glucose reference range applies only to samples taken after fasting for at least 8 hours.  . Glucose-Capillary 02/18/2020 169* 70 - 99 mg/dL Final   Glucose reference range applies only to samples taken after fasting for at least 8 hours.  . Glucose-Capillary 02/18/2020 178* 70 - 99 mg/dL Final   Glucose reference range applies only to samples taken after fasting for at least 8 hours.  . Glucose-Capillary 02/18/2020 156* 70 - 99 mg/dL Final   Glucose reference range applies only to samples taken after fasting for at least 8 hours.  . Glucose-Capillary 02/18/2020 190* 70 - 99 mg/dL Final   Glucose reference range applies only to samples taken after fasting for at least 8 hours.  . Glucose-Capillary 02/18/2020 240* 70 - 99 mg/dL Final   Glucose reference range applies only to samples taken after fasting for at least 8 hours.  . Glucose-Capillary 02/18/2020 237* 70 - 99 mg/dL Final   Glucose reference range applies only to samples taken after fasting for at least 8 hours.  . Glucose-Capillary 02/18/2020 161* 70 - 99 mg/dL Final  Glucose reference range applies only to samples taken after fasting for at least 8 hours.  . Comment 1 02/18/2020 Notify RN   Final  . Glucose-Capillary 02/18/2020 123* 70 - 99 mg/dL Final   Glucose reference range applies only to samples taken after fasting for at least 8 hours.  . Glucose-Capillary 02/18/2020 141* 70 - 99 mg/dL Final   Glucose reference range applies only to samples taken after fasting for at least 8 hours.  . Glucose-Capillary 02/18/2020 135* 70 - 99 mg/dL Final   Glucose reference range applies only to samples taken after fasting for at least 8 hours.  . Glucose-Capillary 02/18/2020 178* 70 - 99 mg/dL Final   Glucose reference range applies only to samples taken after fasting for at least 8 hours.  . Glucose-Capillary 02/18/2020 240* 70 - 99 mg/dL Final   Glucose reference range applies only to samples taken after fasting for  at least 8 hours.  . Glucose-Capillary 02/18/2020 206* 70 - 99 mg/dL Final   Glucose reference range applies only to samples taken after fasting for at least 8 hours.  . Glucose-Capillary 02/18/2020 209* 70 - 99 mg/dL Final   Glucose reference range applies only to samples taken after fasting for at least 8 hours.  Admission on 01/28/2020, Discharged on 01/29/2020  Component Date Value Ref Range Status  . Sodium 01/28/2020 135  135 - 145 mmol/L Final   LIPEMIC SPECIMEN  . Potassium 01/28/2020 3.7  3.5 - 5.1 mmol/L Final  . Chloride 01/28/2020 99  98 - 111 mmol/L Final  . CO2 01/28/2020 24  22 - 32 mmol/L Final  . Glucose, Bld 01/28/2020 286* 70 - 99 mg/dL Final   Glucose reference range applies only to samples taken after fasting for at least 8 hours.  . BUN 01/28/2020 13  6 - 20 mg/dL Final  . Creatinine, Ser 01/28/2020 0.61  0.61 - 1.24 mg/dL Final  . Calcium 16/03/9603 9.1  8.9 - 10.3 mg/dL Final  . Total Protein 01/28/2020 6.4* 6.5 - 8.1 g/dL Final  . Albumin 54/02/8118 3.5  3.5 - 5.0 g/dL Final  . AST 14/78/2956 39  15 - 41 U/L Final  . ALT 01/28/2020 50* 0 - 44 U/L Final  . Alkaline Phosphatase 01/28/2020 110  38 - 126 U/L Final  . Total Bilirubin 01/28/2020 0.9  0.3 - 1.2 mg/dL Final  . GFR calc non Af Amer 01/28/2020 >60  >60 mL/min Final  . GFR calc Af Amer 01/28/2020 >60  >60 mL/min Final  . Anion gap 01/28/2020 12  5 - 15 Final   Performed at [facility], [address] [place], [place] [address]  . Alcohol, Ethyl (B) 01/28/2020 <10  <10 mg/dL Final   Comment: (NOTE) Lowest detectable limit for serum alcohol is 10 mg/dL.  For medical purposes only. Performed at [facility], [address] [place], [place] [address]   . Salicylate Lvl 01/28/2020 <7.0* 7.0 - 30.0 mg/dL Final   Performed at [facility], [address] [place], [place] [address]  . Acetaminophen (Tylenol), Serum 01/28/2020 <10* 10 - 30 ug/mL Final   Comment: (NOTE) Therapeutic  concentrations vary significantly. A range of 10-30 ug/mL  may be an effective concentration for many patients. However, some  are best treated at concentrations outside of this range. Acetaminophen concentrations >150 ug/mL at 4 hours after ingestion  and >50 ug/mL at 12 hours after ingestion are often associated with  toxic reactions.  Performed  at [facility], [address], [place], [place] [address]   . WBC 01/28/2020 4.8  4.0 - 10.5 K/uL Final  . RBC 01/28/2020 5.36  4.22 - 5.81 MIL/uL Final  . Hemoglobin 01/28/2020 14.7  13.0 - 17.0 g/dL Final  . HCT 60/45/4098 44.3  39 - 52 % Final  . MCV 01/28/2020 82.6  80.0 - 100.0 fL Final  . MCH 01/28/2020 27.4  26.0 - 34.0 pg Final  . MCHC 01/28/2020 33.2  30.0 - 36.0 g/dL Final  . RDW 11/91/4782 12.1  11.5 - 15.5 % Final  . Platelets 01/28/2020 301  150 - 400 K/uL Final  . nRBC 01/28/2020 0.0  0.0 - 0.2 % Final   Performed at [facility], [address], [place], [place] [address]  . Opiates 01/28/2020 NONE DETECTED  NONE DETECTED Final  . Cocaine 01/28/2020 NONE DETECTED  NONE DETECTED Final  . Benzodiazepines 01/28/2020 NONE DETECTED  NONE DETECTED Final  . Amphetamines 01/28/2020 NONE DETECTED  NONE DETECTED Final  . Tetrahydrocannabinol 01/28/2020 NONE DETECTED  NONE DETECTED Final  . Barbiturates 01/28/2020 NONE DETECTED  NONE DETECTED Final   Comment: (NOTE) DRUG SCREEN FOR MEDICAL PURPOSES ONLY.  IF CONFIRMATION IS NEEDED FOR ANY PURPOSE, NOTIFY LAB WITHIN 5 DAYS.  LOWEST DETECTABLE LIMITS FOR URINE DRUG SCREEN Drug Class                     Cutoff (ng/mL) Amphetamine and metabolites    1000 Barbiturate and metabolites    200 Benzodiazepine                 200 Tricyclics and metabolites     300 Opiates and metabolites        300 Cocaine and metabolites        300 THC                            50 Performed at [facility], [address], [place], [place] [address]   . Glucose-Capillary  01/28/2020 526* 70 - 99 mg/dL Final   Glucose reference range applies only to samples taken after fasting for at least 8 hours.  . Comment 1 01/28/2020 Notify RN   Final  . Color, Urine 01/28/2020 COLORLESS* YELLOW Final  . APPearance 01/28/2020 CLEAR  CLEAR Final  . Specific Gravity, Urine 01/28/2020 1.018  1.005 - 1.030 Final  . pH 01/28/2020 7.0  5.0 - 8.0 Final  . Glucose, UA 01/28/2020 >=500* NEGATIVE mg/dL Final  . Hgb urine dipstick 01/28/2020 NEGATIVE  NEGATIVE Final  . Bilirubin Urine 01/28/2020 NEGATIVE  NEGATIVE Final  . Ketones, ur 01/28/2020 NEGATIVE  NEGATIVE mg/dL Final  . Protein, ur 78/46/9629 NEGATIVE  NEGATIVE mg/dL Final  . Nitrite 52/84/1324 NEGATIVE  NEGATIVE Final  . Glori Luis 01/28/2020 NEGATIVE  NEGATIVE Final  . RBC / HPF 01/28/2020 0-5  0 - 5 RBC/hpf Final  . Bacteria, UA 01/28/2020 NONE SEEN  NONE SEEN Final   Performed at [facility], [address], [place], [place] [address]  . Glucose-Capillary 01/28/2020 508* 70 - 99 mg/dL Final   Glucose reference range applies only to samples taken after fasting for at least 8 hours.  . Comment 1 01/28/2020 Notify RN   Final  . Glucose-Capillary 01/29/2020 266* 70 - 99 mg/dL Final   Glucose reference range applies only to samples taken after fasting for at least 8  hours.  Marland Kitchen SARS Coronavirus 2 01/29/2020 NEGATIVE  NEGATIVE Final   Comment: (NOTE) SARS-CoV-2 target nucleic acids are NOT DETECTED.  The SARS-CoV-2 RNA is generally detectable in upper and lower respiratory specimens during the acute phase of infection. The lowest concentration of SARS-CoV-2 viral copies this assay can detect is 250 copies / mL. A negative result does not preclude SARS-CoV-2 infection and should not be used as the sole basis for treatment or other patient management decisions.  A negative result may occur with improper specimen collection / handling, submission of specimen other than nasopharyngeal swab, presence of viral  mutation(s) within the areas targeted by this assay, and inadequate number of viral copies (<250 copies / mL). A negative result must be combined with clinical observations, patient history, and epidemiological information.  Fact Sheet for Patients:   BoilerBrush.com.cy  Fact Sheet for Healthcare Providers: https://pope.com/  This test is not yet approved or                           cleared by the [place] FDA and has been authorized for detection and/or diagnosis of SARS-CoV-2 by FDA under an Emergency Use Authorization (EUA).  This EUA will remain in effect (meaning this test can be used) for the duration of the COVID-19 declaration under Section 564(b)(1) of the Act, 21 U.S.C. section 360bbb-3(b)(1), unless the authorization is terminated or revoked sooner.  Performed at [facility], [address] [place], [place] [address]   . Glucose-Capillary 01/29/2020 161* 70 - 99 mg/dL Final   Glucose reference range applies only to samples taken after fasting for at least 8 hours.  . Glucose-Capillary 01/29/2020 332* 70 - 99 mg/dL Final   Glucose reference range applies only to samples taken after fasting for at least 8 hours.  Admission on 01/16/2020, Discharged on 01/16/2020  Component Date Value Ref Range Status  . Glucose-Capillary 01/16/2020 312* 70 - 99 mg/dL Final   Glucose reference range applies only to samples taken after fasting for at least 8 hours.  Admission on 01/15/2020, Discharged on 01/16/2020  Component Date Value Ref Range Status  . Beta-Hydroxybutyric Acid 01/16/2020 6.81* 0.05 - 0.27 mmol/L Final   Comment: RESULTS CONFIRMED BY MANUAL DILUTION Performed at [facility], [address], [place], [place] [address]   . WBC 01/15/2020 28.4* 4.0 - 10.5 K/uL Final  . RBC 01/15/2020 5.61  4.22 - 5.81 MIL/uL Final  . Hemoglobin 01/15/2020 15.6  13.0 - 17.0 g/dL Final  . HCT 82/95/6213 53.3* 39 - 52 %  Final  . MCV 01/15/2020 95.0  80.0 - 100.0 fL Final  . MCH 01/15/2020 27.8  26.0 - 34.0 pg Final  . MCHC 01/15/2020 29.3* 30.0 - 36.0 g/dL Final  . RDW 08/65/7846 12.5  11.5 - 15.5 % Final  . Platelets 01/15/2020 389  150 - 400 K/uL Final  . nRBC 01/15/2020 0.0  0.0 - 0.2 % Final  . Neutrophils Relative % 01/15/2020 78  % Final  . Neutro Abs 01/15/2020 22.1* 1.7 - 7.7 K/uL Final  . Lymphocytes Relative 01/15/2020 9  % Final  . Lymphs Abs 01/15/2020 2.7  0.7 - 4.0 K/uL Final  . Monocytes Relative 01/15/2020 5  % Final  . Monocytes Absolute 01/15/2020 1.4* 0.1 - 1.0 K/uL Final  . Eosinophils Relative 01/15/2020 0  % Final  . Eosinophils Absolute 01/15/2020 0.0  0.0 - 0.5 K/uL Final  . Basophils Relative 01/15/2020  0  % Final  . Basophils Absolute 01/15/2020 0.1  0.0 - 0.1 K/uL Final  . WBC Morphology 01/15/2020 MILD LEFT SHIFT (1-5% METAS, OCC MYELO, OCC BANDS)   Final  . Immature Granulocytes 01/15/2020 8  % Final  . Abs Immature Granulocytes 01/15/2020 2.22* 0.00 - 0.07 K/uL Final   Performed at [facility], [address] [place], [place] [address]  . Sodium 01/15/2020 124* 135 - 145 mmol/L Final  . Potassium 01/15/2020 >7.5* 3.5 - 5.1 mmol/L Final   Comment: NO VISIBLE HEMOLYSIS A.[name],RN 01/15/2020 2020 DAVISB   . Chloride 01/15/2020 86* 98 - 111 mmol/L Final  . CO2 01/15/2020 <7* 22 - 32 mmol/L Final  . Glucose, Bld 01/15/2020 1,399* 70 - 99 mg/dL Final   Comment: Glucose reference range applies only to samples taken after fasting for at least 8 hours. RESULTS CONFIRMED BY MANUAL DILUTION CRITICAL RESULT CALLED TO, READ BACK BY AND VERIFIED WITH: A.[name],RN 01/15/2020 2020 DAVISB   . BUN 01/15/2020 27* 6 - 20 mg/dL Final  . Creatinine, Ser 01/15/2020 2.91* 0.61 - 1.24 mg/dL Final  . Calcium 47/82/9562 8.4* 8.9 - 10.3 mg/dL Final  . Total Protein 01/15/2020 7.8  6.5 - 8.1 g/dL Final  . Albumin 13/01/6577 4.1  3.5 - 5.0 g/dL Final  . AST 46/96/2952 35  15 - 41 U/L Final   . ALT 01/15/2020 53* 0 - 44 U/L Final  . Alkaline Phosphatase 01/15/2020 157* 38 - 126 U/L Final  . Total Bilirubin 01/15/2020 3.0* 0.3 - 1.2 mg/dL Final  . GFR calc non Af Amer 01/15/2020 30* >60 mL/min Final  . GFR calc Af Amer 01/15/2020 34* >60 mL/min Final  . Anion gap 01/15/2020 NOT CALCULATED  5 - 15 Final   Performed at [facility], [address] [place], [place] [address]  . Color, Urine 01/15/2020 STRAW* YELLOW Final  . APPearance 01/15/2020 CLEAR  CLEAR Final  . Specific Gravity, Urine 01/15/2020 1.018  1.005 - 1.030 Final  . pH 01/15/2020 5.0  5.0 - 8.0 Final  . Glucose, UA 01/15/2020 >=500* NEGATIVE mg/dL Final  . Hgb urine dipstick 01/15/2020 SMALL* NEGATIVE Final  . Bilirubin Urine 01/15/2020 NEGATIVE  NEGATIVE Final  . Ketones, ur 01/15/2020 80* NEGATIVE mg/dL Final  . Protein, ur 44/06/270 30* NEGATIVE mg/dL Final  . Nitrite 53/66/4403 NEGATIVE  NEGATIVE Final  . Glori Luis 01/15/2020 NEGATIVE  NEGATIVE Final  . WBC, UA 01/15/2020 0-5  0 - 5 WBC/hpf Final  . Bacteria, UA 01/15/2020 NONE SEEN  NONE SEEN Final  . Squamous Epithelial / LPF 01/15/2020 0-5  0 - 5 Final   Performed at [facility], [address] [place], [place] [address]  . pH, Ven 01/15/2020 6.913* 7.25 - 7.43 Final  . pCO2, Ven 01/15/2020 21.8* 44 - 60 mmHg Final  . pO2, Ven 01/15/2020 137.0* 32 - 45 mmHg Final  . Bicarbonate 01/15/2020 4.4* 20.0 - 28.0 mmol/L Final  . TCO2 01/15/2020 5* 22 - 32 mmol/L Final  . O2 Saturation 01/15/2020 96.0  % Final  . Acid-base deficit 01/15/2020 27.0* 0.0 - 2.0 mmol/L Final  . Sodium 01/15/2020 122* 135 - 145 mmol/L Final  . Potassium 01/15/2020 7.5* 3.5 - 5.1 mmol/L Final  . Calcium, Ion 01/15/2020 0.99* 1.15 - 1.40 mmol/L Final  . HCT 01/15/2020 51.0  39 - 52 % Final  . Hemoglobin 01/15/2020 17.3* 13.0 - 17.0 g/dL Final  . Sample type 95/63/8756 VENOUS   Final  .  Glucose-Capillary 01/15/2020 >600* 70 - 99 mg/dL Final   Glucose reference  range applies only to samples taken after fasting for at least 8 hours.  . Lactic Acid, Venous 01/15/2020 6.3* 0.5 - 1.9 mmol/L Final   Comment: CRITICAL RESULT CALLED TO, READ BACK BY AND VERIFIED WITH: [name] A,RN 01/15/20 2124 WAYK Performed at [facility], [address] [place], [place] [address]   . Lactic Acid, Venous 01/16/2020 3.3* 0.5 - 1.9 mmol/L Final   Comment: CRITICAL VALUE NOTED.  VALUE IS CONSISTENT WITH PREVIOUSLY REPORTED AND CALLED VALUE. Performed at [facility], [address], [place], [place] [address]   . SARS Coronavirus 2 01/15/2020 NEGATIVE  NEGATIVE Final   Comment: (NOTE) SARS-CoV-2 target nucleic acids are NOT DETECTED.  The SARS-CoV-2 RNA is generally detectable in upper and lower respiratory specimens during the acute phase of infection. The lowest concentration of SARS-CoV-2 viral copies this assay can detect is 250 copies / mL. A negative result does not preclude SARS-CoV-2 infection and should not be used as the sole basis for treatment or other patient management decisions.  A negative result may occur with improper specimen collection / handling, submission of specimen other than nasopharyngeal swab, presence of viral mutation(s) within the areas targeted by this assay, and inadequate number of viral copies (<250 copies / mL). A negative result must be combined with clinical observations, patient history, and epidemiological information.  Fact Sheet for Patients:   BoilerBrush.com.cy  Fact Sheet for Healthcare Providers: https://pope.com/  This test is not yet approved or                           cleared by the [place] FDA and has been authorized for detection and/or diagnosis of SARS-CoV-2 by FDA under an Emergency Use Authorization (EUA).  This EUA will remain in effect (meaning this test can be used) for the duration of the COVID-19 declaration under Section 564(b)(1) of  the Act, 21 U.S.C. section 360bbb-3(b)(1), unless the authorization is terminated or revoked sooner.  Performed at [facility], [address] [place], [place] [address]   . HIV Screen 4th Generation wRfx 01/16/2020 Non Reactive  Non Reactive Final   Performed at [facility], [address] [name], [place] [address]  . Sodium 01/16/2020 139  135 - 145 mmol/L Final  . Potassium 01/16/2020 4.5  3.5 - 5.1 mmol/L Final  . Chloride 01/16/2020 104  98 - 111 mmol/L Final  . CO2 01/16/2020 7* 22 - 32 mmol/L Final  . Glucose, Bld 01/16/2020 436* 70 - 99 mg/dL Final   Glucose reference range applies only to samples taken after fasting for at least 8 hours.  . BUN 01/16/2020 22* 6 - 20 mg/dL Final  . Creatinine, Ser 01/16/2020 2.05* 0.61 - 1.24 mg/dL Final  . Calcium 29/56/2130 8.7* 8.9 - 10.3 mg/dL Final  . GFR calc non Af Amer 01/16/2020 45* >60 mL/min Final  . GFR calc Af Amer 01/16/2020 52* >60 mL/min Final  . Anion gap 01/16/2020 28* 5 - 15 Final   Performed at [facility], [address] [place], [place] [address]  . Hgb A1c MFr Bld 01/16/2020 11.3* 4.8 - 5.6 % Final   Comment: (NOTE) Pre diabetes:          5.7%-6.4%  Diabetes:              >6.4%  Glycemic control  for   <7.0% adults with diabetes   . Mean Plasma Glucose 01/16/2020 277.61  mg/dL Final   Performed at [facility], [address], [place], [place] [address]  . Opiates 01/15/2020 NONE DETECTED  NONE DETECTED Final  . Cocaine 01/15/2020 NONE DETECTED  NONE DETECTED Final  . Benzodiazepines 01/15/2020 NONE DETECTED  NONE DETECTED Final  . Amphetamines 01/15/2020 POSITIVE* NONE DETECTED Final  . Tetrahydrocannabinol 01/15/2020 NONE DETECTED  NONE DETECTED Final  . Barbiturates 01/15/2020 NONE DETECTED  NONE DETECTED Final   Comment: (NOTE) DRUG SCREEN FOR MEDICAL PURPOSES ONLY.  IF CONFIRMATION IS NEEDED FOR ANY PURPOSE, NOTIFY LAB WITHIN 5 DAYS.  LOWEST DETECTABLE LIMITS FOR URINE  DRUG SCREEN Drug Class                     Cutoff (ng/mL) Amphetamine and metabolites    1000 Barbiturate and metabolites    200 Benzodiazepine                 200 Tricyclics and metabolites     300 Opiates and metabolites        300 Cocaine and metabolites        300 THC                            50 Performed at [facility], [address], [place], [place] [address]   . Glucose-Capillary 01/15/2020 >600* 70 - 99 mg/dL Final   Glucose reference range applies only to samples taken after fasting for at least 8 hours.  . Glucose-Capillary 01/15/2020 >600* 70 - 99 mg/dL Final   Glucose reference range applies only to samples taken after fasting for at least 8 hours.  . Glucose-Capillary 01/15/2020 >600* 70 - 99 mg/dL Final   Glucose reference range applies only to samples taken after fasting for at least 8 hours.  . Glucose-Capillary 01/15/2020 >600* 70 - 99 mg/dL Final   Glucose reference range applies only to samples taken after fasting for at least 8 hours.  . Glucose-Capillary 01/16/2020 >600* 70 - 99 mg/dL Final   Glucose reference range applies only to samples taken after fasting for at least 8 hours.  Marland Kitchen MRSA by PCR 01/16/2020 NEGATIVE  NEGATIVE Final   Comment:        The GeneXpert MRSA Assay (FDA approved for NASAL specimens only), is one component of a comprehensive MRSA colonization surveillance program. It is not intended to diagnose MRSA infection nor to guide or monitor treatment for MRSA infections. Performed at [facility], [address] [place], [place] [address]   . Glucose-Capillary 01/16/2020 >600* 70 - 99 mg/dL Final   Glucose reference range applies only to samples taken after fasting for at least 8 hours.  . Glucose-Capillary 01/16/2020 518* 70 - 99 mg/dL Final   Glucose reference range applies only to samples taken after fasting for at least 8 hours.  . Comment 1 01/16/2020 Notify RN   Final  . Comment 2 01/16/2020 Glucose Stabilizer    Final  . Glucose-Capillary 01/16/2020 431* 70 - 99 mg/dL Final   Glucose reference range applies only to samples taken after fasting for at least 8 hours.  . Glucose-Capillary 01/16/2020 377* 70 - 99 mg/dL Final   Glucose reference range applies only to samples taken after fasting for at least 8 hours.  Admission on 11/27/2019, Discharged on 11/27/2019  Component Date Value Ref Range  Status  . Glucose-Capillary 11/27/2019 >600* 70 - 99 mg/dL Final   Glucose reference range applies only to samples taken after fasting for at least 8 hours.  . WBC 11/27/2019 8.0  4.0 - 10.5 K/uL Final  . RBC 11/27/2019 5.14  4.22 - 5.81 MIL/uL Final  . Hemoglobin 11/27/2019 14.2  13.0 - 17.0 g/dL Final  . HCT 16/03/9603 41.5  39 - 52 % Final  . MCV 11/27/2019 80.7  80.0 - 100.0 fL Final  . MCH 11/27/2019 27.6  26.0 - 34.0 pg Final  . MCHC 11/27/2019 34.2  30.0 - 36.0 g/dL Final  . RDW 54/02/8118 12.3  11.5 - 15.5 % Final  . Platelets 11/27/2019 279  150 - 400 K/uL Final  . nRBC 11/27/2019 0.0  0.0 - 0.2 % Final   Performed at [facility], [address] [place], [place] [address]  . Color, Urine 11/27/2019 STRAW* YELLOW Final  . APPearance 11/27/2019 CLEAR  CLEAR Final  . Specific Gravity, Urine 11/27/2019 1.024  1.005 - 1.030 Final  . pH 11/27/2019 6.0  5.0 - 8.0 Final  . Glucose, UA 11/27/2019 >=500* NEGATIVE mg/dL Final  . Hgb urine dipstick 11/27/2019 NEGATIVE  NEGATIVE Final  . Bilirubin Urine 11/27/2019 NEGATIVE  NEGATIVE Final  . Ketones, ur 11/27/2019 5* NEGATIVE mg/dL Final  . Protein, ur 95/62/1308 NEGATIVE  NEGATIVE mg/dL Final  . Nitrite 65/78/4696 NEGATIVE  NEGATIVE Final  . Glori Luis 11/27/2019 NEGATIVE  NEGATIVE Final  . Bacteria, UA 11/27/2019 NONE SEEN  NONE SEEN Final   Performed at [facility], [address], [place], [place] [address]  . Glucose-Capillary 11/27/2019 479* 70 - 99 mg/dL Final   Glucose reference range applies only to  samples taken after fasting for at least 8 hours.  . Troponin I (High Sensitivity) 11/27/2019 <2  <18 ng/L Final   Comment: (NOTE) Elevated high sensitivity troponin I (hsTnI) values and significant  changes across serial measurements may suggest ACS but many other  chronic and acute conditions are known to elevate hsTnI results.  Refer to the "Links" section for chest pain algorithms and additional  guidance. Performed at [facility], [address], [place], [place] [address]   . Sodium 11/27/2019 126* 135 - 145 mmol/L Final  . Potassium 11/27/2019 6.1* 3.5 - 5.1 mmol/L Final  . Chloride 11/27/2019 93* 98 - 111 mmol/L Final  . CO2 11/27/2019 19* 22 - 32 mmol/L Final  . Glucose, Bld 11/27/2019 725* 70 - 99 mg/dL Final   Comment: Glucose reference range applies only to samples taken after fasting for at least 8 hours. CRITICAL RESULT CALLED TO, READ BACK BY AND VERIFIED WITH: S.CLAPP,RN [id number]  BY V.WILKINS   . BUN 11/27/2019 15  6 - 20 mg/dL Final  . Creatinine, Ser 11/27/2019 0.98  0.61 - 1.24 mg/dL Final  . Calcium 25/36/6440 8.4* 8.9 - 10.3 mg/dL Final  . Total Protein 11/27/2019 5.9* 6.5 - 8.1 g/dL Final  . Albumin 34/74/2595 3.8  3.5 - 5.0 g/dL Final  . AST 63/87/5643 53* 15 - 41 U/L Final  . ALT 11/27/2019 35  0 - 44 U/L Final  . Alkaline Phosphatase 11/27/2019 125  38 - 126 U/L Final  . Total Bilirubin 11/27/2019 3.0* 0.3 - 1.2 mg/dL Final  . GFR calc non Af Amer 11/27/2019 >60  >60 mL/min Final  . GFR calc Af Amer 11/27/2019 >60  >60 mL/min Final  . Anion gap 11/27/2019 14  5 -  15 Final   Performed at [facility], [address] [place], [place] [address]  . Glucose-Capillary 11/27/2019 375* 70 - 99 mg/dL Final   Glucose reference range applies only to samples taken after fasting for at least 8 hours.  . Sodium 11/27/2019 136  135 - 145 mmol/L Final   DELTA CHECK NOTED  . Potassium 11/27/2019 3.1* 3.5 - 5.1 mmol/L Final    [place]TA CHECK NOTED  . Chloride 11/27/2019 107  98 - 111 mmol/L Final  . CO2 11/27/2019 20* 22 - 32 mmol/L Final  . Glucose, Bld 11/27/2019 250* 70 - 99 mg/dL Final   Glucose reference range applies only to samples taken after fasting for at least 8 hours.  . BUN 11/27/2019 11  6 - 20 mg/dL Final  . Creatinine, Ser 11/27/2019 0.63  0.61 - 1.24 mg/dL Final  . Calcium 37/03/6268 7.4* 8.9 - 10.3 mg/dL Final  . GFR calc non Af Amer 11/27/2019 >60  >60 mL/min Final  . GFR calc Af Amer 11/27/2019 >60  >60 mL/min Final  . Anion gap 11/27/2019 9  5 - 15 Final   Performed at [facility], [address], [place], [place] [address]  . Glucose-Capillary 11/27/2019 159* 70 - 99 mg/dL Final   Glucose reference range applies only to samples taken after fasting for at least 8 hours.  . Glucose-Capillary 11/27/2019 152* 70 - 99 mg/dL Final   Glucose reference range applies only to samples taken after fasting for at least 8 hours.  Admission on 11/25/2019, Discharged on 11/25/2019  Component Date Value Ref Range Status  . Glucose-Capillary 11/25/2019 500* 70 - 99 mg/dL Final   Glucose reference range applies only to samples taken after fasting for at least 8 hours.  . Sodium 11/25/2019 129* 135 - 145 mmol/L Final  . Potassium 11/25/2019 4.4  3.5 - 5.1 mmol/L Final  . Chloride 11/25/2019 94* 98 - 111 mmol/L Final  . CO2 11/25/2019 13* 22 - 32 mmol/L Final  . Glucose, Bld 11/25/2019 548* 70 - 99 mg/dL Final   Comment: Glucose reference range applies only to samples taken after fasting for at least 8 hours. CRITICAL RESULT CALLED TO, READ BACK BY AND VERIFIED WITH: M COFFEY RN [id number] K FORSYTH   . BUN 11/25/2019 15  6 - 20 mg/dL Final  . Creatinine, Ser 11/25/2019 1.60* 0.61 - 1.24 mg/dL Final  . Calcium 09/38/1829 9.1  8.9 - 10.3 mg/dL Final  . Total Protein 11/25/2019 8.1  6.5 - 8.1 g/dL Final  . Albumin 93/71/6967 4.2  3.5 - 5.0 g/dL Final  . AST 89/38/1017 44* 15 - 41 U/L  Final  . ALT 11/25/2019 46* 0 - 44 U/L Final  . Alkaline Phosphatase 11/25/2019 133* 38 - 126 U/L Final  . Total Bilirubin 11/25/2019 3.0* 0.3 - 1.2 mg/dL Final  . GFR calc non Af Amer 11/25/2019 >60  >60 mL/min Final  . GFR calc Af Amer 11/25/2019 >60  >60 mL/min Final  . Anion gap 11/25/2019 22* 5 - 15 Final   Performed at [facility], [address], [place], [place] [address]  . Lipase 11/25/2019 16  11 - 51 U/L Final   Performed at [facility], [address], [place], [place] [address]  . WBC 11/25/2019 15.5* 4.0 - 10.5 K/uL Final  . RBC 11/25/2019 6.07* 4.22 - 5.81 MIL/uL Final  . Hemoglobin 11/25/2019 16.6  13.0 - 17.0 g/dL Final  .  HCT 11/25/2019 51.5  39 - 52 % Final  . MCV 11/25/2019 84.8  80.0 - 100.0 fL Final  . MCH 11/25/2019 27.3  26.0 - 34.0 pg Final  . MCHC 11/25/2019 32.2  30.0 - 36.0 g/dL Final  . RDW 78/58/8502 12.8  11.5 - 15.5 % Final  . Platelets 11/25/2019 393  150 - 400 K/uL Final  . nRBC 11/25/2019 0.0  0.0 - 0.2 % Final  . Neutrophils Relative % 11/25/2019 72  % Final  . Neutro Abs 11/25/2019 11.2* 1.7 - 7.7 K/uL Final  . Lymphocytes Relative 11/25/2019 22  % Final  . Lymphs Abs 11/25/2019 3.4  0.7 - 4.0 K/uL Final  . Monocytes Relative 11/25/2019 5  % Final  . Monocytes Absolute 11/25/2019 0.8  0.1 - 1.0 K/uL Final  . Eosinophils Relative 11/25/2019 0  % Final  . Eosinophils Absolute 11/25/2019 0.0  0.0 - 0.5 K/uL Final  . Basophils Relative 11/25/2019 1  % Final  . Basophils Absolute 11/25/2019 0.2* 0.0 - 0.1 K/uL Final  . nRBC 11/25/2019 0  0 /100 WBC Final  . Abs Immature Granulocytes 11/25/2019 0.00  0.00 - 0.07 K/uL Final   Performed at [facility], [address], [place], [place] [address]  . Color, Urine 11/25/2019 STRAW* YELLOW Final  . APPearance 11/25/2019 CLEAR  CLEAR Final  . Specific Gravity, Urine 11/25/2019 1.020  1.005 - 1.030 Final  . pH 11/25/2019 5.0  5.0 - 8.0 Final  . Glucose, UA 11/25/2019 >=500* NEGATIVE  mg/dL Final  . Hgb urine dipstick 11/25/2019 NEGATIVE  NEGATIVE Final  . Bilirubin Urine 11/25/2019 NEGATIVE  NEGATIVE Final  . Ketones, ur 11/25/2019 80* NEGATIVE mg/dL Final  . Protein, ur 87/86/7672 NEGATIVE  NEGATIVE mg/dL Final  . Nitrite 09/47/0962 NEGATIVE  NEGATIVE Final  . Glori Luis 11/25/2019 NEGATIVE  NEGATIVE Final  . WBC, UA 11/25/2019 0-5  0 - 5 WBC/hpf Final  . Bacteria, UA 11/25/2019 NONE SEEN  NONE SEEN Final   Performed at [facility], [address] [place], [place] [address]  . Opiates 11/25/2019 NONE DETECTED  NONE DETECTED Final  . Cocaine 11/25/2019 NONE DETECTED  NONE DETECTED Final  . Benzodiazepines 11/25/2019 NONE DETECTED  NONE DETECTED Final  . Amphetamines 11/25/2019 POSITIVE* NONE DETECTED Final  . Tetrahydrocannabinol 11/25/2019 NONE DETECTED  NONE DETECTED Final  . Barbiturates 11/25/2019 NONE DETECTED  NONE DETECTED Final   Comment: (NOTE) DRUG SCREEN FOR MEDICAL PURPOSES ONLY.  IF CONFIRMATION IS NEEDED FOR ANY PURPOSE, NOTIFY LAB WITHIN 5 DAYS. LOWEST DETECTABLE LIMITS FOR URINE DRUG SCREEN Drug Class                     Cutoff (ng/mL) Amphetamine and metabolites    1000 Barbiturate and metabolites    200 Benzodiazepine                 200 Tricyclics and metabolites     300 Opiates and metabolites        300 Cocaine and metabolites        300 THC                            50 Performed at [facility], [address] [place], [place] [address]   . Alcohol, Ethyl (B) 11/25/2019 <10  <10 mg/dL Final   Comment: (NOTE) Lowest detectable limit for serum alcohol is 10 mg/dL. For medical  purposes only. Performed at [facility], [address], [place], [place] [address]   . pH, Ven 11/25/2019 7.122* 7.25 - 7.43 Final  . pCO2, Ven 11/25/2019 39.4* 44 - 60 mmHg Final  . pO2, Ven 11/25/2019 110.0* 32 - 45 mmHg Final  . Bicarbonate 11/25/2019 12.9* 20.0 - 28.0 mmol/L Final  . TCO2 11/25/2019 14* 22 - 32 mmol/L Final  .  O2 Saturation 11/25/2019 96.0  % Final  . Acid-base deficit 11/25/2019 16.0* 0.0 - 2.0 mmol/L Final  . Sodium 11/25/2019 130* 135 - 145 mmol/L Final  . Potassium 11/25/2019 4.2  3.5 - 5.1 mmol/L Final  . Calcium, Ion 11/25/2019 1.22  1.15 - 1.40 mmol/L Final  . HCT 11/25/2019 53.0* 39 - 52 % Final  . Hemoglobin 11/25/2019 18.0* 13.0 - 17.0 g/dL Final  . Sample type 18/29/9371 VENOUS   Final  . SARS Coronavirus 2 11/25/2019 NEGATIVE  NEGATIVE Final   Comment: (NOTE) SARS-CoV-2 target nucleic acids are NOT DETECTED. The SARS-CoV-2 RNA is generally detectable in upper and lower respiratory specimens during the acute phase of infection. The lowest concentration of SARS-CoV-2 viral copies this assay can detect is 250 copies / mL. A negative result does not preclude SARS-CoV-2 infection and should not be used as the sole basis for treatment or other patient management decisions.  A negative result may occur with improper specimen collection / handling, submission of specimen other than nasopharyngeal swab, presence of viral mutation(s) within the areas targeted by this assay, and inadequate number of viral copies (<250 copies / mL). A negative result must be combined with clinical observations, patient history, and epidemiological information. Fact Sheet for Patients:   BoilerBrush.com.cy Fact Sheet for Healthcare Providers: https://pope.com/ This test is not yet approved or cleared                           by the [place] FDA and has been authorized for detection and/or diagnosis of SARS-CoV-2 by FDA under an Emergency Use Authorization (EUA).  This EUA will remain in effect (meaning this test can be used) for the duration of the COVID-19 declaration under Section 564(b)(1) of the Act, 21 U.S.C. section 360bbb-3(b)(1), unless the authorization is terminated or revoked sooner. Performed at [facility], [address]  [place], [place] [address]   . Group A Strep by PCR 11/25/2019 NOT DETECTED  NOT DETECTED Final   Performed at [facility], [address], [place], [place] [address]  . Glucose-Capillary 11/25/2019 455* 70 - 99 mg/dL Final   Glucose reference range applies only to samples taken after fasting for at least 8 hours.    Allergies: Bee venom, Fish allergy, Shellfish allergy, and Tea  PTA Medications: (Not in a hospital admission)   Medical Decision Making  Admission labs ordered  Monitor CBGs TID with meals and HS  Continue Lantus 30 units subcutaneously BID for BM Novolog SSI 0-15 units TID with meals   Haldol 10 mg oral x 1 dose Benadryl 50 mg oral x 1 dose Ativan 1 mg oral x 1 dose  Clinical Course as of May 04 301  Fri May 04, 2020  0222 Glucose-Capillary(!): 235 [JB]    Clinical Course User Index [JB] Jackelyn Poling, NP    Recommendations  Based on my evaluation the patient does not appear to have an emergency medical condition.   Patient will be placed in the continuous assessment area  at [facility] for treatment and stabilization. He will be reevaluated on 05/04/2020. The treatment team will determine disposition at that time.      Jackelyn Poling, NP 05/04/20  3:02 AM

## 2020-05-04 NOTE — ED Notes (Signed)
Pt belongings in Locker 31 

## 2020-05-04 NOTE — ED Notes (Signed)
Pt moved to flex area and became tphysically & verbally aggressive, demanding to leave. Staff attempted to verbally deescalate with minimal success. Pt punched wall and threatened physical harm to staff after being asked to let medication settle due to it's sedating nature. Pt refused and continued pacing and being verbally aggressive.

## 2020-05-04 NOTE — ED Notes (Signed)
Pt belongings in locker 31 °

## 2020-05-04 NOTE — ED Provider Notes (Signed)
FBC/OBS ASAP Discharge Summary  Date and Time: 05/04/2020 4:23 AM  Name: [name] [name].  MRN:  [id number]   Discharge Diagnoses:  Final diagnoses:  Substance-induced psychotic disorder (HCC)    Subjective: "I am ready to leave. If you don't let me leave right now I am going to punch you in the face."  Stay Summary: [name] [name] is a 19 y.o. male with a history of Bipolar Disorder, Type I DM, and Methamphetamine use who presented to [facility] voluntarily. Patient reported that he lives nearby and walked to [facility]. He was diaphoretic. CBG 235, B/P 136/97 HR 116. He was alert and oriented x 3. He stateed that he was hearing voices. Responded that he could understand the voices but did not respond when asked to describe the voices. He appeared to have some thought blocking.  He denied suicidal ideations. He denied homicidal ideations. When reviewing medications, he stated "I haven't been taking any of my mental health medicines." Patient reported that he was taking his insulin as prescribed. Patient's knuckles on his right hand are reddened. Patient later reported that he used fake blood on halloween and has been scrubbing his hands. Initially patient denied substance use, he later reported that he bought some marijuana that he thinks was laced.  UDS positive for methamphetamine and marijuana. He stated that he is followed by [facility] but he was not able to recall the last time that he saw his ACT Team.  After receiving ativan, haldol, and benadryl patient's thought process appeared to clear, he became more alert, was able to provide more detail about why he presented to [facility]. However, he started pacing and demanding to leave. Threatened to hit this provider and nurse if he was not allowed to leave immediately. I attempted to arrange transportation, but patient declined and stated that he could walk home. States "I live two minutes from here." Patient has a history of presenting to the ED and behavioral  health and requesting to leave AMA soon after receiving treatment. At the time of discharge patient is alert and oriented x 4, gait is steady, he denies SI, denies HI, denies AVH, and does not appear to be responding to internal stimuli. Patient refused discharge instructions. He was escorted out of the building by the [employer].   Total Time spent with patient: 15 minutes  Past Psychiatric History: Bipolar, Methamphetamine Use Past Medical History:  Past Medical History:  Diagnosis Date  . ADD (attention deficit disorder)   . Allergy   . Anxiety   . Asthma   . Depression   . Epileptic seizure (HCC)    "both petit and grand mal; last sz ~ 2 wk ago" (06/17/2013)  . Headache(784.0)    "2-3 times/wk usually" (06/17/2013)  . Heart murmur    "heard a slight one earlier today" (06/17/2013)  . Homeless   . Migraine    "maybe once/month; it's severe" (06/17/2013)  . ODD (oppositional defiant disorder)   . Sickle cell trait (HCC)   . Type I diabetes mellitus (HCC)    "that's what they're thinking now" (06/17/2013)  . Vision abnormalities    "takes him longer to focus cause; from his sz" (06/17/2013)    Past Surgical History:  Procedure Laterality Date  . CIRCUMCISION  2000  . FINGER SURGERY Left 2001   "crushed pinky; had to repair it" (06/17/2013)   Family History:  Family History  Problem Relation Age of Onset  . Asthma Mother   .  COPD Mother   . Other Mother        possible autoimmune, unclear  . Diabetes Maternal Grandmother   . Heart disease Maternal Grandmother   . Hypertension Maternal Grandmother   . Mental illness Maternal Grandmother   . Heart disease Maternal Grandfather   . Hyperlipidemia Paternal Grandmother   . Hyperlipidemia Paternal Grandfather   . Migraines Sister        Hemiplegic Migraines    Family Psychiatric History:  Social History:  Social History   Substance and Sexual Activity  Alcohol Use No  . Alcohol/week: 0.0 standard drinks      Social History   Substance and Sexual Activity  Drug Use Yes  . Frequency: 7.0 times per week  . Types: Marijuana, Methamphetamines    Social History   Socioeconomic History  . Marital status: Significant Other    Spouse name: Not on file  . Number of children: 1  . Years of education: Not on file  . Highest education level: Not on file  Occupational History  . Occupation: Unemployed  Tobacco Use  . Smoking status: Current Every Day Smoker    Packs/day: 0.50    Types: Cigarettes  . Smokeless tobacco: Never Used  . Tobacco comment: Mom and dad smoke outside   Vaping Use  . Vaping Use: Former  Substance and Sexual Activity  . Alcohol use: No    Alcohol/week: 0.0 standard drinks  . Drug use: Yes    Frequency: 7.0 times per week    Types: Marijuana, Methamphetamines  . Sexual activity: Yes  Other Topics Concern  . Not on file  Social History Narrative   Lives with significant other/fiancee; followed by [facility]   Social Determinants of Health   Financial Resource Strain:   . Difficulty of Paying Living Expenses: Not on file  Food Insecurity:   . Worried About Charity fundraiser in the Last Year: Not on file  . Ran Out of Food in the Last Year: Not on file  Transportation Needs:   . Lack of Transportation (Medical): Not on file  . Lack of Transportation (Non-Medical): Not on file  Physical Activity:   . Days of Exercise per Week: Not on file  . Minutes of Exercise per Session: Not on file  Stress:   . Feeling of Stress : Not on file  Social Connections:   . Frequency of Communication with Friends and Family: Not on file  . Frequency of Social Gatherings with Friends and Family: Not on file  . Attends Religious Services: Not on file  . Active Member of Clubs or Organizations: Not on file  . Attends Archivist Meetings: Not on file  . Marital Status: Not on file   SDOH:  SDOH Screenings   Alcohol Screen: Low Risk   . Last Alcohol Screening Score  (AUDIT): 0  Depression (PHQ2-9):   . PHQ-2 Score: Not on file  Financial Resource Strain:   . Difficulty of Paying Living Expenses: Not on file  Food Insecurity:   . Worried About Charity fundraiser in the Last Year: Not on file  . Ran Out of Food in the Last Year: Not on file  Housing:   . Last Housing Risk Score: Not on file  Physical Activity:   . Days of Exercise per Week: Not on file  . Minutes of Exercise per Session: Not on file  Social Connections:   . Frequency of Communication with Friends and Family: Not  on file  . Frequency of Social Gatherings with Friends and Family: Not on file  . Attends Religious Services: Not on file  . Active Member of Clubs or Organizations: Not on file  . Attends Archivist Meetings: Not on file  . Marital Status: Not on file  Stress:   . Feeling of Stress : Not on file  Tobacco Use: High Risk  . Smoking Tobacco Use: Current Every Day Smoker  . Smokeless Tobacco Use: Never Used  Transportation Needs:   . Film/video editor (Medical): Not on file  . Lack of Transportation (Non-Medical): Not on file    Has this patient used any form of tobacco in the last 30 days? (Cigarettes, Smokeless Tobacco, Cigars, and/or Pipes) A prescription for an FDA-approved tobacco cessation medication was offered at discharge and the patient refused  Current Medications:  Current Facility-Administered Medications  Medication Dose Route Frequency Provider Last Rate Last Admin  . acetaminophen (TYLENOL) tablet 650 mg  650 mg Oral Q6H PRN Lindon Romp A, NP      . albuterol (VENTOLIN HFA) 108 (90 Base) MCG/ACT inhaler 1 puff  1 puff Inhalation Q4H PRN Rozetta Nunnery, NP      . alum & mag hydroxide-simeth (MAALOX/MYLANTA) 200-200-20 MG/5ML suspension 30 mL  30 mL Oral Q4H PRN Rozetta Nunnery, NP      . fluticasone (FLOVENT HFA) 110 MCG/ACT inhaler 2 puff  2 puff Inhalation BID Lindon Romp A, NP      . hydrOXYzine (ATARAX/VISTARIL) tablet 25 mg  25 mg  Oral TID PRN Lindon Romp A, NP      . insulin aspart (novoLOG) injection 0-15 Units  0-15 Units Subcutaneous TID WC Lindon Romp A, NP      . insulin glargine (LANTUS) injection 30 Units  30 Units Subcutaneous BID Lindon Romp A, NP      . magnesium hydroxide (MILK OF MAGNESIA) suspension 30 mL  30 mL Oral Daily PRN Rozetta Nunnery, NP       Current Outpatient Medications  Medication Sig Dispense Refill  . albuterol (PROVENTIL HFA;VENTOLIN HFA) 108 (90 Base) MCG/ACT inhaler Inhale 1 puff into the lungs every 4 (four) hours as needed for wheezing or shortness of breath. 1 Inhaler 0  . ARIPiprazole (ABILIFY) 5 MG tablet Take 5 mg by mouth daily.    . blood glucose meter kit and supplies KIT Dispense based on patient and insurance preference. Use up to four times daily as directed. (FOR ICD-9 250.00, 250.01). 1 each 0  . buPROPion (WELLBUTRIN SR) 150 MG 12 hr tablet Take 150 mg by mouth daily.    . clotrimazole (LOTRIMIN) 1 % cream Apply to affected area 2 times daily 15 g 0  . FLOVENT HFA 110 MCG/ACT inhaler Inhale 2 puffs into the lungs in the morning and at bedtime.    Marland Kitchen FLUoxetine (PROZAC) 20 MG capsule Take 1 capsule (20 mg total) by mouth daily. (Patient not taking: Reported on 10/06/2019) 30 capsule 0  . gabapentin (NEURONTIN) 300 MG capsule Take 300 mg by mouth daily.     Marland Kitchen glucose blood (FREESTYLE TEST STRIPS) test strip Use as instructed 100 each 0  . hydrOXYzine (ATARAX/VISTARIL) 25 MG tablet Take 25 mg by mouth daily.     . insulin aspart (NOVOLOG FLEXPEN) 100 UNIT/ML FlexPen Inject 1-15 Units into the skin 3 (three) times daily with meals. 1 unit/g of carbohydrate sliding scale. 5 pen 11  . Insulin Glargine (LANTUS) 100 UNIT/ML Solostar  Pen Inject 30 Units into the skin 2 (two) times daily. 15 mL 11  . LATUDA 20 MG TABS tablet Take 20 mg by mouth daily.     Marland Kitchen MINIPRESS 1 MG capsule Take 1 mg by mouth daily.     . mirtazapine (REMERON) 7.5 MG tablet Take 7.5 mg by mouth at bedtime.    Marland Kitchen  QUEtiapine (SEROQUEL) 50 MG tablet Take 50 mg by mouth at bedtime.      PTA Medications: (Not in a hospital admission)   Musculoskeletal  Strength & Muscle Tone: within normal limits Gait & Station: normal Patient leans: N/A  Psychiatric Specialty Exam  Presentation  General Appearance: Neat  Eye Contact:Good  Speech:Clear and Coherent;Normal Rate  Speech Volume:Normal  Handedness:Right   Mood and Affect  Mood:Angry  Affect:Restricted   Thought Process  Thought Processes:Coherent;Linear;Goal Directed  Descriptions of Associations:Intact  Orientation:Full (Time, Place and Person)  Thought Content:Other (comment) (patient irritable)  Hallucinations:Hallucinations: Other (comment) (denies) Description of Auditory Hallucinations: denies  Ideas of Reference:None  Suicidal Thoughts:Suicidal Thoughts: No  Homicidal Thoughts:Homicidal Thoughts: No   Sensorium  Memory:Immediate Good;Recent Good;Remote Good  Judgment:Fair  Insight:Fair   Executive Functions  Concentration:Fair  Attention Span:Fair  [place]  Language:Good   Psychomotor Activity  Psychomotor Activity:Psychomotor Activity: Restlessness   Assets  Assets:Communication Skills;Desire for Improvement;Housing   Sleep  Sleep:Sleep: Fair   Physical Exam  Physical Exam Constitutional:      General: He is not in acute distress.    Appearance: He is not ill-appearing, toxic-appearing or diaphoretic.  HENT:     Head: Normocephalic.     Right Ear: External ear normal.     Left Ear: External ear normal.  Eyes:     Pupils: Pupils are equal, round, and reactive to light.  Cardiovascular:     Rate and Rhythm: Normal rate.  Pulmonary:     Effort: Pulmonary effort is normal. No respiratory distress.  Musculoskeletal:        General: Normal range of motion.  Skin:    General: Skin is warm and dry.  Neurological:     Mental Status: He is alert and oriented  to person, place, and time.  Psychiatric:        Mood and Affect: Mood is depressed.        Speech: Speech normal.        Behavior: Behavior is cooperative.        Thought Content: Thought content is not paranoid or delusional. Thought content does not include homicidal or suicidal ideation. Thought content does not include suicidal plan.    Review of Systems  Constitutional: Negative for chills and diaphoresis.  Respiratory: Negative for cough and shortness of breath.   Cardiovascular: Negative for chest pain.  Gastrointestinal: Negative for diarrhea, nausea and vomiting.  Neurological: Negative for dizziness, tremors, seizures, loss of consciousness and weakness.  Psychiatric/Behavioral: Positive for substance abuse. Negative for hallucinations and suicidal ideas.   Blood pressure (!) 136/97, pulse (!) 116, temperature 97.9 F (36.6 C), temperature source Temporal, resp. rate (!) 22, SpO2 99 %. There is no height or weight on file to calculate BMI.  Demographic Factors:  Male and Adolescent or young adult  Loss Factors: NA  Historical Factors: Prior suicide attempts  Risk Reduction Factors:   Living with another person, especially a relative and Positive social support  Continued Clinical Symptoms:  Bipolar Disorder  Cognitive Features That Contribute To Risk:  None    Suicide  Risk:  Mild:  Suicidal ideation of limited frequency, intensity, duration, and specificity.  There are no identifiable plans, no associated intent, mild dysphoria and related symptoms, good self-control (both objective and subjective assessment), few other risk factors, and identifiable protective factors, including available and accessible social support.  Plan Of Care/Follow-up recommendations:  Other:  follow up with ACT Team  Disposition: Follow Up with ACT Team and current providers  Rozetta Nunnery, NP 05/04/2020, 4:23 AM

## 2020-05-10 ENCOUNTER — Emergency Department (HOSPITAL_COMMUNITY): Payer: No Typology Code available for payment source

## 2020-05-10 ENCOUNTER — Emergency Department (HOSPITAL_COMMUNITY)
Admission: EM | Admit: 2020-05-10 | Discharge: 2020-05-11 | Disposition: A | Discharge status: Home / Self Care | Payer: No Typology Code available for payment source | Attending: Emergency Medicine | Admitting: Emergency Medicine

## 2020-05-10 ENCOUNTER — Other Ambulatory Visit: Payer: Self-pay

## 2020-05-10 ENCOUNTER — Encounter (HOSPITAL_COMMUNITY): Payer: Self-pay

## 2020-05-10 DIAGNOSIS — E1042 Type 1 diabetes mellitus with diabetic polyneuropathy: Secondary | ICD-10-CM | POA: Diagnosis present

## 2020-05-10 DIAGNOSIS — R930 Abnormal findings on diagnostic imaging of skull and head, not elsewhere classified: Secondary | ICD-10-CM | POA: Insufficient documentation

## 2020-05-10 DIAGNOSIS — E8729 Other acidosis: Secondary | ICD-10-CM

## 2020-05-10 DIAGNOSIS — S2249XA Multiple fractures of ribs, unspecified side, initial encounter for closed fracture: Secondary | ICD-10-CM | POA: Diagnosis present

## 2020-05-10 DIAGNOSIS — Y92410 Unspecified street and highway as the place of occurrence of the external cause: Secondary | ICD-10-CM | POA: Insufficient documentation

## 2020-05-10 DIAGNOSIS — R748 Abnormal levels of other serum enzymes: Secondary | ICD-10-CM | POA: Diagnosis present

## 2020-05-10 DIAGNOSIS — M545 Low back pain, unspecified: Secondary | ICD-10-CM | POA: Diagnosis not present

## 2020-05-10 DIAGNOSIS — R109 Unspecified abdominal pain: Secondary | ICD-10-CM | POA: Diagnosis not present

## 2020-05-10 DIAGNOSIS — J45909 Unspecified asthma, uncomplicated: Secondary | ICD-10-CM | POA: Insufficient documentation

## 2020-05-10 DIAGNOSIS — S2242XA Multiple fractures of ribs, left side, initial encounter for closed fracture: Secondary | ICD-10-CM

## 2020-05-10 DIAGNOSIS — M542 Cervicalgia: Secondary | ICD-10-CM | POA: Diagnosis not present

## 2020-05-10 DIAGNOSIS — R7401 Elevation of levels of liver transaminase levels: Secondary | ICD-10-CM | POA: Diagnosis present

## 2020-05-10 DIAGNOSIS — F17210 Nicotine dependence, cigarettes, uncomplicated: Secondary | ICD-10-CM | POA: Insufficient documentation

## 2020-05-10 DIAGNOSIS — Y9389 Activity, other specified: Secondary | ICD-10-CM | POA: Insufficient documentation

## 2020-05-10 DIAGNOSIS — F319 Bipolar disorder, unspecified: Secondary | ICD-10-CM | POA: Diagnosis present

## 2020-05-10 DIAGNOSIS — T1490XA Injury, unspecified, initial encounter: Secondary | ICD-10-CM

## 2020-05-10 DIAGNOSIS — R0781 Pleurodynia: Secondary | ICD-10-CM | POA: Insufficient documentation

## 2020-05-10 DIAGNOSIS — Z794 Long term (current) use of insulin: Secondary | ICD-10-CM | POA: Diagnosis not present

## 2020-05-10 DIAGNOSIS — E109 Type 1 diabetes mellitus without complications: Secondary | ICD-10-CM | POA: Diagnosis not present

## 2020-05-10 DIAGNOSIS — F603 Borderline personality disorder: Secondary | ICD-10-CM | POA: Diagnosis present

## 2020-05-10 LAB — CBC WITH DIFFERENTIAL/PLATELET
Abs Immature Granulocytes: 0.11 10*3/uL — ABNORMAL HIGH (ref 0.00–0.07)
Basophils Absolute: 0.1 10*3/uL (ref 0.0–0.1)
Basophils Relative: 1 %
Eosinophils Absolute: 0.1 10*3/uL (ref 0.0–0.5)
Eosinophils Relative: 1 %
HCT: 48.8 % (ref 39.0–52.0)
Hemoglobin: 17 g/dL (ref 13.0–17.0)
Immature Granulocytes: 2 %
Lymphocytes Relative: 23 %
Lymphs Abs: 1.6 10*3/uL (ref 0.7–4.0)
MCH: 28.9 pg (ref 26.0–34.0)
MCHC: 34.8 g/dL (ref 30.0–36.0)
MCV: 82.9 fL (ref 80.0–100.0)
Monocytes Absolute: 0.5 10*3/uL (ref 0.1–1.0)
Monocytes Relative: 7 %
Neutro Abs: 4.4 10*3/uL (ref 1.7–7.7)
Neutrophils Relative %: 66 %
Platelets: 326 10*3/uL (ref 150–400)
RBC: 5.89 MIL/uL — ABNORMAL HIGH (ref 4.22–5.81)
RDW: 11.8 % (ref 11.5–15.5)
WBC: 6.7 10*3/uL (ref 4.0–10.5)
nRBC: 0 % (ref 0.0–0.2)

## 2020-05-10 LAB — SAMPLE TO BLOOD BANK

## 2020-05-10 MED ORDER — MORPHINE SULFATE (PF) 4 MG/ML IV SOLN
4.0000 mg | Freq: Once | INTRAVENOUS | Status: AC
  Administered 2020-05-10: 4 mg via INTRAVENOUS
  Filled 2020-05-10: qty 1

## 2020-05-10 MED ORDER — SODIUM CHLORIDE 0.9 % IV BOLUS (SEPSIS)
1000.0000 mL | Freq: Once | INTRAVENOUS | Status: AC
  Administered 2020-05-10: 1000 mL via INTRAVENOUS

## 2020-05-10 MED ORDER — ONDANSETRON HCL 4 MG/2ML IJ SOLN
4.0000 mg | Freq: Once | INTRAMUSCULAR | Status: AC
  Administered 2020-05-10: 4 mg via INTRAVENOUS
  Filled 2020-05-10: qty 2
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
## 2020-05-11 ENCOUNTER — Emergency Department (HOSPITAL_COMMUNITY): Payer: No Typology Code available for payment source

## 2020-05-11 DIAGNOSIS — S2249XA Multiple fractures of ribs, unspecified side, initial encounter for closed fracture: Secondary | ICD-10-CM | POA: Diagnosis present

## 2020-05-11 DIAGNOSIS — M542 Cervicalgia: Secondary | ICD-10-CM | POA: Diagnosis not present

## 2020-05-11 DIAGNOSIS — R748 Abnormal levels of other serum enzymes: Secondary | ICD-10-CM | POA: Diagnosis present

## 2020-05-11 DIAGNOSIS — R7401 Elevation of levels of liver transaminase levels: Secondary | ICD-10-CM | POA: Diagnosis present

## 2020-05-11 HISTORY — DX: Multiple fractures of ribs, unspecified side, initial encounter for closed fracture: S22.49XA

## 2020-05-11 LAB — COMPREHENSIVE METABOLIC PANEL
ALT: 192 U/L — ABNORMAL HIGH (ref 0–44)
AST: 585 U/L — ABNORMAL HIGH (ref 15–41)
Albumin: 4.2 g/dL (ref 3.5–5.0)
Alkaline Phosphatase: 153 U/L — ABNORMAL HIGH (ref 38–126)
Anion gap: 21 — ABNORMAL HIGH (ref 5–15)
BUN: 19 mg/dL (ref 6–20)
CO2: 16 mmol/L — ABNORMAL LOW (ref 22–32)
Calcium: 9.9 mg/dL (ref 8.9–10.3)
Chloride: 97 mmol/L — ABNORMAL LOW (ref 98–111)
Creatinine, Ser: 1.14 mg/dL (ref 0.61–1.24)
GFR, Estimated: >60 mL/min (ref >60)
Glucose, Bld: 181 mg/dL — ABNORMAL HIGH (ref 70–99)
Potassium: 3.6 mmol/L (ref 3.5–5.1)
Sodium: 134 mmol/L — ABNORMAL LOW (ref 135–145)
Total Bilirubin: 1 mg/dL (ref 0.3–1.2)
Total Protein: 7.6 g/dL (ref 6.5–8.1)

## 2020-05-11 LAB — I-STAT VENOUS BLOOD GAS, ED
Acid-base deficit: 3 mmol/L — ABNORMAL HIGH (ref 0.0–2.0)
Bicarbonate: 22.5 mmol/L (ref 20.0–28.0)
Calcium, Ion: 1.15 mmol/L (ref 1.15–1.40)
HCT: 55 % — ABNORMAL HIGH (ref 39.0–52.0)
Hemoglobin: 18.7 g/dL — ABNORMAL HIGH (ref 13.0–17.0)
O2 Saturation: 88 %
Potassium: 3.6 mmol/L (ref 3.5–5.1)
Sodium: 137 mmol/L (ref 135–145)
TCO2: 24 mmol/L (ref 22–32)
pCO2, Ven: 42.4 mmHg — ABNORMAL LOW (ref 44.0–60.0)
pH, Ven: 7.334 (ref 7.250–7.430)
pO2, Ven: 58 mmHg — ABNORMAL HIGH (ref 32.0–45.0)

## 2020-05-11 LAB — LACTIC ACID, PLASMA: Lactic Acid, Venous: 2.2 mmol/L (ref 0.5–1.9)

## 2020-05-11 LAB — URINALYSIS, ROUTINE W REFLEX MICROSCOPIC
Bacteria, UA: NONE SEEN
Bilirubin Urine: NEGATIVE
Glucose, UA: >=500 mg/dL — AB
Ketones, ur: 5 mg/dL — AB
Leukocytes,Ua: NEGATIVE
Nitrite: NEGATIVE
Protein, ur: 30 mg/dL — AB
Specific Gravity, Urine: 1.031 — ABNORMAL HIGH (ref 1.005–1.030)
pH: 5 (ref 5.0–8.0)

## 2020-05-11 LAB — RAPID URINE DRUG SCREEN, HOSP PERFORMED
Amphetamines: NOT DETECTED
Barbiturates: NOT DETECTED
Benzodiazepines: NOT DETECTED
Cocaine: NOT DETECTED
Opiates: POSITIVE — AB
Tetrahydrocannabinol: POSITIVE — AB

## 2020-05-11 LAB — HEPATITIS PANEL, ACUTE
HCV Ab: NONREACTIVE
Hep A IgM: NONREACTIVE
Hep B C IgM: NONREACTIVE
Hepatitis B Surface Ag: NONREACTIVE

## 2020-05-11 LAB — ETHANOL: Alcohol, Ethyl (B): <10 mg/dL (ref <10)

## 2020-05-11 LAB — CBG MONITORING, ED
Glucose-Capillary: 165 mg/dL — ABNORMAL HIGH (ref 70–99)
Glucose-Capillary: 240 mg/dL — ABNORMAL HIGH (ref 70–99)
Glucose-Capillary: 334 mg/dL — ABNORMAL HIGH (ref 70–99)

## 2020-05-11 LAB — ACETAMINOPHEN LEVEL: Acetaminophen (Tylenol), Serum: <10 ug/mL — ABNORMAL LOW (ref 10–30)

## 2020-05-11 LAB — LIPASE, BLOOD: Lipase: 24 U/L (ref 11–51)

## 2020-05-11 LAB — BETA-HYDROXYBUTYRIC ACID: Beta-Hydroxybutyric Acid: 0.47 mmol/L — ABNORMAL HIGH (ref 0.05–0.27)

## 2020-05-11 MED ORDER — SODIUM CHLORIDE 0.9 % IV BOLUS (SEPSIS)
1000.0000 mL | Freq: Once | INTRAVENOUS | Status: AC
  Administered 2020-05-11: 1000 mL via INTRAVENOUS

## 2020-05-11 MED ORDER — NOVOLOG FLEXPEN 100 UNIT/ML ~~LOC~~ SOPN
1.0000 [IU] | PEN_INJECTOR | Freq: Three times a day (TID) | SUBCUTANEOUS | 6 refills | Status: DC
Start: 2020-05-11 — End: 2020-11-09

## 2020-05-11 MED ORDER — SODIUM CHLORIDE 0.9 % IV SOLN: INTRAVENOUS | Status: DC

## 2020-05-11 MED ORDER — IOHEXOL 300 MG/ML  SOLN
100.0000 mL | Freq: Once | INTRAMUSCULAR | Status: AC | PRN
  Administered 2020-05-11: 100 mL via INTRAVENOUS

## 2020-05-11 MED ORDER — BLOOD GLUCOSE MONITOR KIT: PACK | 0 refills | Status: DC

## 2020-05-11 MED ORDER — INSULIN GLARGINE 100 UNIT/ML SOLOSTAR PEN: 30.0000 [IU] | PEN_INJECTOR | Freq: Two times a day (BID) | SUBCUTANEOUS | 6 refills | Status: DC

## 2020-05-11 MED ORDER — INSULIN GLARGINE 100 UNIT/ML ~~LOC~~ SOLN
30.0000 [IU] | Freq: Once | SUBCUTANEOUS | Status: AC
  Administered 2020-05-11: 30 [IU] via SUBCUTANEOUS
  Filled 2020-05-11: qty 0.3

## 2020-05-11 MED ORDER — IBUPROFEN 800 MG PO TABS: 800.0000 mg | ORAL_TABLET | Freq: Three times a day (TID) | ORAL | 0 refills | Status: DC | PRN

## 2020-05-11 MED ORDER — ONDANSETRON 4 MG PO TBDP: 4.0000 mg | ORAL_TABLET | Freq: Four times a day (QID) | ORAL | 0 refills | Status: DC | PRN

## 2020-05-11 MED ORDER — OXYCODONE HCL 5 MG PO TABS
5.0000 mg | ORAL_TABLET | Freq: Four times a day (QID) | ORAL | 0 refills | Status: DC | PRN
Start: 2020-05-11 — End: 2020-11-09

## 2020-05-11 MED ORDER — DOCUSATE SODIUM 100 MG PO CAPS: 100.0000 mg | ORAL_CAPSULE | Freq: Two times a day (BID) | ORAL | 0 refills | Status: DC

## 2020-05-11 MED ORDER — MORPHINE SULFATE (PF) 4 MG/ML IV SOLN
4.0000 mg | Freq: Once | INTRAVENOUS | Status: AC
  Administered 2020-05-11: 4 mg via INTRAVENOUS
  Filled 2020-05-11: qty 1

## 2020-05-11 MED ORDER — FREESTYLE TEST VI STRP
ORAL_STRIP | 0 refills | Status: DC
Start: 2020-05-11 — End: 2020-11-09

## 2020-05-11 NOTE — Discharge Instructions (Addendum)
We have discussed your care with the hospitalist, gastroenterologist and trauma doctor on-call.  We all agree that you need to follow-up closely with your primary care physician and do not need admission at this time.  You will need to manage your diabetes very closely.  I have given you information for a local endocrinologist.  I recommend that you use your incentive spirometer every 1-2 hours while awake for the next 2 weeks to prevent pneumonia.  Please take your pain medication only as needed.  You may take ibuprofen 800 mg every 8 hours as needed for pain.  This medication is found over-the-counter.  I recommend avoiding Tylenol also known as acetaminophen given your elevated liver function test.  I recommend avoiding alcohol and illicit drugs.  You are being provided a prescription for opiates (also known as narcotics) for pain control.  Opiates can be addictive and should only be used when absolutely necessary for pain control when other alternatives do not work.  We recommend you only use them for the recommended amount of time and only as prescribed.  Please do not take with other sedative medications or alcohol.  Please do not drive, operate machinery, make important decisions while taking opiates.  Please note that these medications can be addictive and have high abuse potential.  Patients can become addicted to narcotics after only taking them for a few days.  Please keep these medications locked away from children, teenagers or any family members with history of substance abuse.  Narcotic pain medicine may also make you constipated.  You may use over-the-counter medications such as MiraLAX, Colace to prevent constipation.  If you become constipated you may use over-the-counter enemas as needed.  Itching and nausea are common side effects of narcotic pain medication.  If you develop uncontrolled vomiting or a rash, please stop these medications.

## 2020-05-11 NOTE — ED Notes (Signed)
100% on room air while ambulating to bathroom

## 2020-05-17 ENCOUNTER — Encounter (HOSPITAL_COMMUNITY): Payer: Self-pay

## 2020-05-17 ENCOUNTER — Emergency Department (HOSPITAL_COMMUNITY)
Admission: EM | Admit: 2020-05-17 | Discharge: 2020-05-18 | Disposition: A | Discharge status: Home / Self Care | Payer: Medicaid Other | Attending: Emergency Medicine | Admitting: Emergency Medicine

## 2020-05-17 DIAGNOSIS — Z046 Encounter for general psychiatric examination, requested by authority: Secondary | ICD-10-CM | POA: Insufficient documentation

## 2020-05-17 DIAGNOSIS — F314 Bipolar disorder, current episode depressed, severe, without psychotic features: Secondary | ICD-10-CM | POA: Diagnosis present

## 2020-05-17 DIAGNOSIS — D573 Sickle-cell trait: Secondary | ICD-10-CM | POA: Diagnosis present

## 2020-05-17 DIAGNOSIS — E109 Type 1 diabetes mellitus without complications: Secondary | ICD-10-CM | POA: Diagnosis not present

## 2020-05-17 DIAGNOSIS — R259 Unspecified abnormal involuntary movements: Secondary | ICD-10-CM | POA: Diagnosis not present

## 2020-05-17 DIAGNOSIS — F250 Schizoaffective disorder, bipolar type: Secondary | ICD-10-CM | POA: Diagnosis present

## 2020-05-17 DIAGNOSIS — R45851 Suicidal ideations: Secondary | ICD-10-CM | POA: Diagnosis not present

## 2020-05-17 DIAGNOSIS — J45909 Unspecified asthma, uncomplicated: Secondary | ICD-10-CM | POA: Insufficient documentation

## 2020-05-17 DIAGNOSIS — F1910 Other psychoactive substance abuse, uncomplicated: Secondary | ICD-10-CM | POA: Diagnosis present

## 2020-05-17 DIAGNOSIS — F17210 Nicotine dependence, cigarettes, uncomplicated: Secondary | ICD-10-CM | POA: Diagnosis not present

## 2020-05-17 DIAGNOSIS — F1520 Other stimulant dependence, uncomplicated: Secondary | ICD-10-CM | POA: Diagnosis present

## 2020-05-17 DIAGNOSIS — F259 Schizoaffective disorder, unspecified: Secondary | ICD-10-CM | POA: Diagnosis present

## 2020-05-17 DIAGNOSIS — F331 Major depressive disorder, recurrent, moderate: Secondary | ICD-10-CM | POA: Diagnosis present

## 2020-05-17 DIAGNOSIS — Z20822 Contact with and (suspected) exposure to covid-19: Secondary | ICD-10-CM | POA: Diagnosis not present

## 2020-05-17 LAB — CBC
HCT: 44 % (ref 39.0–52.0)
Hemoglobin: 15.1 g/dL (ref 13.0–17.0)
MCH: 28.3 pg (ref 26.0–34.0)
MCHC: 34.3 g/dL (ref 30.0–36.0)
MCV: 82.6 fL (ref 80.0–100.0)
Platelets: 297 10*3/uL (ref 150–400)
RBC: 5.33 MIL/uL (ref 4.22–5.81)
RDW: 11.7 % (ref 11.5–15.5)
WBC: 8.4 10*3/uL (ref 4.0–10.5)
nRBC: 0 % (ref 0.0–0.2)

## 2020-05-17 LAB — ETHANOL: Alcohol, Ethyl (B): <10 mg/dL (ref <10)

## 2020-05-17 LAB — RAPID URINE DRUG SCREEN, HOSP PERFORMED
Amphetamines: NOT DETECTED
Barbiturates: NOT DETECTED
Benzodiazepines: NOT DETECTED
Cocaine: NOT DETECTED
Opiates: NOT DETECTED
Tetrahydrocannabinol: POSITIVE — AB

## 2020-05-17 LAB — COMPREHENSIVE METABOLIC PANEL
ALT: 44 U/L (ref 0–44)
AST: 28 U/L (ref 15–41)
Albumin: 3.7 g/dL (ref 3.5–5.0)
Alkaline Phosphatase: 117 U/L (ref 38–126)
Anion gap: 14 (ref 5–15)
BUN: 12 mg/dL (ref 6–20)
CO2: 21 mmol/L — ABNORMAL LOW (ref 22–32)
Calcium: 9.3 mg/dL (ref 8.9–10.3)
Chloride: 99 mmol/L (ref 98–111)
Creatinine, Ser: 0.89 mg/dL (ref 0.61–1.24)
GFR, Estimated: >60 mL/min (ref >60)
Glucose, Bld: 344 mg/dL — ABNORMAL HIGH (ref 70–99)
Potassium: 3.9 mmol/L (ref 3.5–5.1)
Sodium: 134 mmol/L — ABNORMAL LOW (ref 135–145)
Total Bilirubin: 1.4 mg/dL — ABNORMAL HIGH (ref 0.3–1.2)
Total Protein: 7.3 g/dL (ref 6.5–8.1)

## 2020-05-17 LAB — ACETAMINOPHEN LEVEL: Acetaminophen (Tylenol), Serum: <10 ug/mL — ABNORMAL LOW (ref 10–30)

## 2020-05-17 LAB — CBG MONITORING, ED: Glucose-Capillary: 443 mg/dL — ABNORMAL HIGH (ref 70–99)

## 2020-05-17 LAB — SALICYLATE LEVEL: Salicylate Lvl: <7 mg/dL — ABNORMAL LOW (ref 7.0–30.0)

## 2020-05-17 MED ORDER — PRAZOSIN HCL 1 MG PO CAPS: 1.0000 mg | ORAL_CAPSULE | Freq: Every day | ORAL | Status: DC

## 2020-05-17 MED ORDER — BUDESONIDE 0.25 MG/2ML IN SUSP
0.2500 mg | Freq: Two times a day (BID) | RESPIRATORY_TRACT | Status: DC
  Filled 2020-05-17: qty 2

## 2020-05-17 MED ORDER — BUPROPION HCL ER (SR) 150 MG PO TB12
150.0000 mg | ORAL_TABLET | Freq: Every day | ORAL | Status: DC
  Administered 2020-05-17 – 2020-05-18 (×2): 150 mg via ORAL
  Filled 2020-05-17 (×2): qty 1

## 2020-05-17 MED ORDER — FLUOXETINE HCL 20 MG PO CAPS
20.0000 mg | ORAL_CAPSULE | Freq: Every day | ORAL | Status: DC
  Administered 2020-05-17 – 2020-05-18 (×2): 20 mg via ORAL
  Filled 2020-05-17 (×2): qty 1

## 2020-05-17 MED ORDER — ONDANSETRON 4 MG PO TBDP: 4.0000 mg | ORAL_TABLET | Freq: Four times a day (QID) | ORAL | Status: DC | PRN

## 2020-05-17 MED ORDER — LURASIDONE HCL 20 MG PO TABS
20.0000 mg | ORAL_TABLET | Freq: Every day | ORAL | Status: DC
  Administered 2020-05-17 – 2020-05-18 (×2): 20 mg via ORAL
  Filled 2020-05-17 (×2): qty 1

## 2020-05-17 MED ORDER — GABAPENTIN 300 MG PO CAPS
300.0000 mg | ORAL_CAPSULE | Freq: Every day | ORAL | Status: DC
  Administered 2020-05-17 – 2020-05-18 (×2): 300 mg via ORAL
  Filled 2020-05-17 (×2): qty 1

## 2020-05-17 MED ORDER — DOCUSATE SODIUM 100 MG PO CAPS
100.0000 mg | ORAL_CAPSULE | Freq: Two times a day (BID) | ORAL | Status: DC
  Administered 2020-05-17 – 2020-05-18 (×2): 100 mg via ORAL
  Filled 2020-05-17 (×2): qty 1

## 2020-05-17 MED ORDER — INSULIN GLARGINE 100 UNIT/ML ~~LOC~~ SOLN
30.0000 [IU] | Freq: Two times a day (BID) | SUBCUTANEOUS | Status: DC
  Administered 2020-05-17 – 2020-05-18 (×2): 30 [IU] via SUBCUTANEOUS
  Filled 2020-05-17 (×4): qty 0.3

## 2020-05-17 MED ORDER — ARIPIPRAZOLE 5 MG PO TABS
5.0000 mg | ORAL_TABLET | Freq: Every day | ORAL | Status: DC
  Administered 2020-05-17 – 2020-05-18 (×2): 5 mg via ORAL
  Filled 2020-05-17 (×2): qty 1

## 2020-05-17 MED ORDER — KETOROLAC TROMETHAMINE 15 MG/ML IJ SOLN
15.0000 mg | Freq: Once | INTRAMUSCULAR | Status: AC
  Administered 2020-05-17: 15 mg via INTRAMUSCULAR
  Filled 2020-05-17: qty 1

## 2020-05-17 MED ORDER — ALBUTEROL SULFATE HFA 108 (90 BASE) MCG/ACT IN AERS: 1.0000 | INHALATION_SPRAY | RESPIRATORY_TRACT | Status: DC | PRN

## 2020-05-17 NOTE — [facility] Counselor (Signed)
Pt consented for clinician to call his wife Edsel Petrin, [phone]) to gather additional information. Clinician received the message: "call rejected," clinician was unable to leave HIPPA compliant voice message, the call ended.     Redmond Pulling, MS, [facility], [facility] Triage Specialist [phone]

## 2020-05-17 NOTE — ED Notes (Signed)
TTS in process 

## 2020-05-17 NOTE — [facility] Counselor (Signed)
Pt's IVC paperwork to be faxed. Once paperwork is received clinician to complete the pt's assessment.    Redmond Pulling, MS, [facility], [facility] Triage Specialist [phone]
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
## 2020-05-17 NOTE — [facility] Counselor (Signed)
Clinician attempted to contact the IVC petitioner(s) Milas Gain and Barnet Glasgow with [facility] ACT Team to gather addtional information. Clinician left a HIPPA compliant voice message.    Redmond Pulling, MS, [facility], [facility] Triage Specialist [phone]

## 2020-05-17 NOTE — ED Triage Notes (Signed)
Pt bib [employer] as IVC for SI. Per IVC paperwork pt was threatening to kill himself. Pt has hx of schizophrenia and bipolar. Pt denies SI/HI in triage.

## 2020-05-18 DIAGNOSIS — F250 Schizoaffective disorder, bipolar type: Secondary | ICD-10-CM | POA: Diagnosis present

## 2020-05-18 DIAGNOSIS — F259 Schizoaffective disorder, unspecified: Secondary | ICD-10-CM | POA: Diagnosis present

## 2020-05-18 LAB — RESPIRATORY PANEL BY RT PCR (FLU A&B, COVID)
Influenza A by PCR: NEGATIVE
Influenza B by PCR: NEGATIVE
SARS Coronavirus 2 by RT PCR: NEGATIVE

## 2020-05-18 LAB — HEMOGLOBIN A1C
Hgb A1c MFr Bld: 11.7 % — ABNORMAL HIGH (ref 4.8–5.6)
Mean Plasma Glucose: 289.09 mg/dL

## 2020-05-18 LAB — CBG MONITORING, ED
Glucose-Capillary: 418 mg/dL — ABNORMAL HIGH (ref 70–99)
Glucose-Capillary: 234 mg/dL — ABNORMAL HIGH (ref 70–99)

## 2020-05-18 MED ORDER — INSULIN ASPART 100 UNIT/ML ~~LOC~~ SOLN: 0.0000 [IU] | Freq: Three times a day (TID) | SUBCUTANEOUS | Status: DC

## 2020-05-18 MED ORDER — INSULIN ASPART 100 UNIT/ML ~~LOC~~ SOLN
6.0000 [IU] | Freq: Three times a day (TID) | SUBCUTANEOUS | Status: DC
  Administered 2020-05-18: 6 [IU] via SUBCUTANEOUS

## 2020-05-18 MED ORDER — INSULIN ASPART 100 UNIT/ML ~~LOC~~ SOLN: 0.0000 [IU] | Freq: Every day | SUBCUTANEOUS | Status: DC

## 2020-05-18 MED ORDER — INSULIN ASPART 100 UNIT/ML ~~LOC~~ SOLN
0.0000 [IU] | Freq: Three times a day (TID) | SUBCUTANEOUS | Status: DC
  Administered 2020-05-18: 15 [IU] via SUBCUTANEOUS
  Administered 2020-05-18: 5 [IU] via SUBCUTANEOUS

## 2020-05-18 MED ORDER — LORAZEPAM 1 MG PO TABS
1.0000 mg | ORAL_TABLET | Freq: Once | ORAL | Status: AC
  Administered 2020-05-18: 1 mg via ORAL
  Filled 2020-05-18: qty 1

## 2020-05-18 MED ORDER — IBUPROFEN 800 MG PO TABS
800.0000 mg | ORAL_TABLET | Freq: Once | ORAL | Status: AC
  Administered 2020-05-18: 800 mg via ORAL
  Filled 2020-05-18: qty 1

## 2020-05-18 NOTE — Discharge Instructions (Signed)
Substance Abuse Treatment Programs ° °Intensive Outpatient Programs °[facility]     °[address]      °[place], [place]                   °[phone]      ° °[facility] °[address] °[place], [place] °[phone] ° °[name]     °(Inpatient and outpatient)     °[address]           °[phone]   ° °[facility] °[phone] (Suboxone and Methadone) ° °[address]      °[place], [place] [address]      °[phone]      ° °[address] °[place], [place] °[phone] ° °[facility] (Outpatient/Inpatient, [facility])    °(insurance only) [phone]      °       °[facility] (Groups & Residential) °[place], [place] °[phone] ° °   °[facility]     °[address]     °[place], [place]      °[phone]      ° °[facility] (for caregivers and family) °[address] °[place], [place] °[phone] ° ° ° ° ° °Residential Treatment Programs °[facility]      °[address], [place], [place] [address]  °[phone]      ° °[facility] °[address] [place], [place] [address] °[phone] ° °[facility]        °[phone]      ° °[facility] °[phone] ° °[facility]             °[address]                                         °[place], [place]                                                °[phone] or [phone]                              ° °[facility] [place] °[address] °[place], [address] °[phone] ° °[facility]    °[address]      °[place], [place]     °[phone]      ° °[facility] °[address] °[place], [place] °[phone] ° °[facility]   °[address]     °[place], [place] [address]     °[phone]      °Admissions: 8am-3pm M-F ° °[facility] °[address] °[place],  [place] °[phone] ° °[facility]: Residential Program (90 Days)   °[place], [place]      °[phone] or [phone]    ° °[facility] °[place], [place] °(Walk in Hours over the weekend or by referral) ° °[facility] °[address], [place], [place] [address] °([phone] ° °Crisis Mobile: [facility]:  1-877-626-1772 (for crisis response 24 hours a day) °[facility] Hotline:      1-800-256-2452 °Outpatient Psychiatry and Counseling ° °[facility]: Mobile Crisis   Management 24 hours:  1-877-626-1772 ° °[facility] sliding scale fee and walk in schedule: M-F 8am-12pm/1pm-3pm °[address]  °[place], [place] [address] °[phone] ° °[facility] °[address] °[place], [place] [address] °[phone] ° °[facility] ([facility] known [facility])- new patient walk-in appointments available Monday - Friday 8am -3pm.          °[address] °[place], [place] [address] °336-676-6840 or crisis line- 336-676-6905 ° °[facility] Intensive Outpatient Therapy Program °[address] °[place], [place] [address] °336-832-9804 ° °[facility]                  °Crisis Services      °[phone]      °[address]     °[place], [place] [address]                ° °[facility]   °[facility] °[phone] °[address] °[place], [place] [address] ° ° °[facility]          °[address]  °[place], [place] [address]       °[phone] ° °[facility] °[address] °[place], [place] [address] °336-292-1510 ° °[facility]    °[address]    °[place], [place] [address]     °[phone]      ° °Parish McKinney, MD     °[address]     °[place] [address]     °[phone]     °  °[facility] °[address] °[place] [address] ° °[facility]     °[address]     °[place], [place]      °336-542-2076      ° °[facility] °[name] Ahluwalia, MD °[address] °[place], [place] [address] °[phone] ° °[facility]     °[address]     °[place], [place] [address]     °336-274-1237      ° °[facility] °[address] °[place], [place] [address] °336-854-4450 °Resources for Temporary Residential Assistance/Crisis Centers ° °DAY CENTERS °[facility] °M-F 8am-3pm   °[address] [place], [place] [address]   336-332-0824 °Services include: laundry, barbering, support groups, case management, phone  & computer access, showers, AA/NA mtgs, mental health/substance abuse nurse, job skills class, disability information, VA assistance, spiritual classes, etc.  ° °HOMELESS SHELTERS ° °[facility]     °[facility]   °[address], [place]     °[phone]       °       °[facility] (women and children)       °[address] °, [place] [address] °336-275-0820 °[email or web address] for application and process °Application Required ° °[facility]   °[address]    °[place] [address]     °[phone]       °             °[facility] °[address] °, [place] [address] °336.273.5572 °336-235-0363(schedule application appt.) °Application Required ° °[facility] (women only)    °[address]     °[place],  [address]     °336-884-1039      °  Intake starts 6pm daily °Need valid ID, SSC, & Police report °[facility] [place] °[address] °[place], [place] °336-881-5420 °Application Required ° °[facility] (men only)     °[address]      °[place],  [place]     °[phone]      ° °[facility] °(Pregnant women only) °[address] °[place], [place] °336-275-0206 ° °[facility]      °[address]      °[place], [place] [address]     °[phone]      °       °[facility] °[address] °[place], [place] °336-723-1848 °90 day commitment/SA/Application process ° °[facility](men only)     °[address]     °[place], [place]     °[phone]       °Check-in at 7pm     °       °[facility] °[address] °[place], [place] [address] °336-248-6684 °Men/Women/Women and Children must be there by 7 pm ° °[facility] °[place], [place] °336-722-8721                ° °

## 2020-05-18 NOTE — ED Notes (Addendum)
Dinner tray ordered.
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
## 2020-05-18 NOTE — ED Provider Notes (Signed)
Blood pressure (!) 147/90, pulse 98, temperature 97.7 F (36.5 C), temperature source Oral, resp. rate 20, height 5\' 10"  (1.778 m), weight 104.3 kg, SpO2 97 %.  In short, [name] [name]. is a 19 y.o. male with a chief complaint of Suicidal .  Refer to the original H&P for additional details.  06:03 PM  Patient evaluated by behavioral health today.  He was brought in yesterday after IVC.  I reviewed the initial EDP note as well as TTS evaluation from today.  Behavioral health has followed with his support team and they, along with patient, feel comfortable with discharge home.  On my reevaluation of the patient he is awake, alert, cooperative.  He denies any active suicidal or homicidal ideation.  He is feeling safe with the plan at discharge.  I have rescinded the IVC.  Patient to be discharged from the ED.     34, MD 05/18/20 [phone]

## 2020-05-18 NOTE — Progress Notes (Addendum)
Inpatient Diabetes Program Recommendations  AACE/ADA: New Consensus Statement on Inpatient Glycemic Control (2015)  Target Ranges:  Prepandial:   less than 140 mg/dL      Peak postprandial:   less than 180 mg/dL (1-2 hours)      Critically ill patients:  140 - 180 mg/dL   Results for [name] [name], [name] [name] (MRN [id number]) as of 05/18/2020 06:49  Ref. Range 05/17/2020 21:59  Glucose-Capillary Latest Ref Range: 70 - 99 mg/dL 825 (H)  30 units LANTUS given at 11:04pm    Admit with: IVC for SI--Per IVC paperwork pt was threatening to kill himself--Pt has hx of schizophrenia and bipolar   History: Type 1 Diabetes  Home DM Meds: Lantus 30 units BID       Novolog 1-15 units TID  Current Orders: Lantus 30 units BID     MD- Patient with History of Type 1 diabetes--makes no endogenous insulin and will need both Novolog correction scale and Novolog meal coverage when he eats  Please consider adding:  1. Novolog Moderate Correction Scale/ SSI (0-15 units) TID AC + HS (Use Glycemic Control Order set)  2. Start Novolog Meal Coverage: Novolog 6 units TID with meals to start  (Please add the following Hold Parameters: Hold if pt eats <50% of meal, Hold if pt NPO)   Addendum 2pm--Spoke w/ Dr. Estell Harpin (ED provider) by phone and discussed [name] home insulin regimen.  Dr. Estell Harpin gave me permission to enter orders for Novolog 0-15 units TID ac + hs for correction scale (1st dose due for 2pm) and then dosing will be before meals after that starting at 5pm tonight.  Called RN Elliot Gurney to discuss and reviewed the above info.  Asked RN to please check a CBG STAT at 2pm and cover with the ordered Novolog 0-15 unit scale now.      --Will follow patient during hospitalization--  Ambrose Finland RN, MSN, CDE Diabetes Coordinator Inpatient Glycemic Control Team Team Pager: [phone] ([id number])

## 2020-05-18 NOTE — ED Notes (Signed)
Pt's IVC has been rescinded by EDP Long.

## 2020-05-18 NOTE — ED Notes (Signed)
All belongings was returned & accounted for, clothing and his cell phone. Pt was given a buss pass & his D/C paper work. All paperwork was explained & understood before pt departure.

## 2020-09-14 ENCOUNTER — Observation Stay (HOSPITAL_COMMUNITY)
Admission: EM | Admit: 2020-09-14 | Discharge: 2020-09-14 | Disposition: A | Discharge status: Home / Self Care | Payer: Medicaid Other | Attending: Internal Medicine | Admitting: Internal Medicine

## 2020-09-14 ENCOUNTER — Emergency Department (HOSPITAL_COMMUNITY): Payer: Medicaid Other

## 2020-09-14 ENCOUNTER — Other Ambulatory Visit: Payer: Self-pay

## 2020-09-14 DIAGNOSIS — R7989 Other specified abnormal findings of blood chemistry: Secondary | ICD-10-CM

## 2020-09-14 DIAGNOSIS — E348 Other specified endocrine disorders: Secondary | ICD-10-CM | POA: Diagnosis not present

## 2020-09-14 DIAGNOSIS — F319 Bipolar disorder, unspecified: Secondary | ICD-10-CM | POA: Diagnosis not present

## 2020-09-14 DIAGNOSIS — R17 Unspecified jaundice: Secondary | ICD-10-CM | POA: Insufficient documentation

## 2020-09-14 DIAGNOSIS — Z794 Long term (current) use of insulin: Secondary | ICD-10-CM | POA: Insufficient documentation

## 2020-09-14 DIAGNOSIS — Z9114 Patient's other noncompliance with medication regimen: Secondary | ICD-10-CM | POA: Insufficient documentation

## 2020-09-14 DIAGNOSIS — Z91148 Patient's other noncompliance with medication regimen for other reason: Secondary | ICD-10-CM

## 2020-09-14 DIAGNOSIS — F1990 Other psychoactive substance use, unspecified, uncomplicated: Secondary | ICD-10-CM

## 2020-09-14 DIAGNOSIS — Z79899 Other long term (current) drug therapy: Secondary | ICD-10-CM | POA: Insufficient documentation

## 2020-09-14 DIAGNOSIS — E1010 Type 1 diabetes mellitus with ketoacidosis without coma: Principal | ICD-10-CM | POA: Diagnosis present

## 2020-09-14 DIAGNOSIS — J45909 Unspecified asthma, uncomplicated: Secondary | ICD-10-CM | POA: Diagnosis not present

## 2020-09-14 DIAGNOSIS — Z87898 Personal history of other specified conditions: Secondary | ICD-10-CM

## 2020-09-14 DIAGNOSIS — N179 Acute kidney failure, unspecified: Secondary | ICD-10-CM | POA: Insufficient documentation

## 2020-09-14 DIAGNOSIS — E1065 Type 1 diabetes mellitus with hyperglycemia: Secondary | ICD-10-CM | POA: Diagnosis present

## 2020-09-14 DIAGNOSIS — F603 Borderline personality disorder: Secondary | ICD-10-CM | POA: Diagnosis present

## 2020-09-14 DIAGNOSIS — F17210 Nicotine dependence, cigarettes, uncomplicated: Secondary | ICD-10-CM | POA: Diagnosis not present

## 2020-09-14 LAB — BASIC METABOLIC PANEL
Anion gap: 23 — ABNORMAL HIGH (ref 5–15)
Anion gap: 26 — ABNORMAL HIGH (ref 5–15)
BUN: 12 mg/dL (ref 6–20)
BUN: 11 mg/dL (ref 6–20)
CO2: 10 mmol/L — ABNORMAL LOW (ref 22–32)
CO2: 7 mmol/L — ABNORMAL LOW (ref 22–32)
Calcium: 8.8 mg/dL — ABNORMAL LOW (ref 8.9–10.3)
Calcium: 8.8 mg/dL — ABNORMAL LOW (ref 8.9–10.3)
Chloride: 94 mmol/L — ABNORMAL LOW (ref 98–111)
Chloride: 98 mmol/L (ref 98–111)
Creatinine, Ser: 1.47 mg/dL — ABNORMAL HIGH (ref 0.61–1.24)
Creatinine, Ser: 1.74 mg/dL — ABNORMAL HIGH (ref 0.61–1.24)
GFR, Estimated: >60 mL/min (ref >60)
GFR, Estimated: 56 mL/min — ABNORMAL LOW (ref >60)
Glucose, Bld: 527 mg/dL (ref 70–99)
Glucose, Bld: 553 mg/dL (ref 70–99)
Potassium: 4.1 mmol/L (ref 3.5–5.1)
Potassium: 4.8 mmol/L (ref 3.5–5.1)
Sodium: 127 mmol/L — ABNORMAL LOW (ref 135–145)
Sodium: 131 mmol/L — ABNORMAL LOW (ref 135–145)

## 2020-09-14 LAB — LIPASE, BLOOD: Lipase: 22 U/L (ref 11–51)

## 2020-09-14 LAB — CBC
HCT: 44 % (ref 39.0–52.0)
Hemoglobin: 15.6 g/dL (ref 13.0–17.0)
MCH: 28.2 pg (ref 26.0–34.0)
MCHC: 35.5 g/dL (ref 30.0–36.0)
MCV: 79.6 fL — ABNORMAL LOW (ref 80.0–100.0)
Platelets: 294 10*3/uL (ref 150–400)
RBC: 5.53 MIL/uL (ref 4.22–5.81)
RDW: 11.8 % (ref 11.5–15.5)
WBC: 12.9 10*3/uL — ABNORMAL HIGH (ref 4.0–10.5)
nRBC: 0 % (ref 0.0–0.2)

## 2020-09-14 LAB — CBG MONITORING, ED
Glucose-Capillary: 504 mg/dL (ref 70–99)
Glucose-Capillary: 548 mg/dL (ref 70–99)
Glucose-Capillary: 442 mg/dL — ABNORMAL HIGH (ref 70–99)
Glucose-Capillary: 347 mg/dL — ABNORMAL HIGH (ref 70–99)
Glucose-Capillary: 322 mg/dL — ABNORMAL HIGH (ref 70–99)

## 2020-09-14 LAB — URINALYSIS, MICROSCOPIC (REFLEX)
Bacteria, UA: NONE SEEN
RBC / HPF: NONE SEEN RBC/hpf (ref 0–5)
Squamous Epithelial / HPF: NONE SEEN (ref 0–5)

## 2020-09-14 LAB — HEPATIC FUNCTION PANEL
ALT: 50 U/L — ABNORMAL HIGH (ref 0–44)
AST: 37 U/L (ref 15–41)
Albumin: 4.1 g/dL (ref 3.5–5.0)
Alkaline Phosphatase: 115 U/L (ref 38–126)
Bilirubin, Direct: 0.2 mg/dL (ref 0.0–0.2)
Indirect Bilirubin: 4.8 mg/dL — ABNORMAL HIGH (ref 0.3–0.9)
Total Bilirubin: 5 mg/dL — ABNORMAL HIGH (ref 0.3–1.2)
Total Protein: 8.2 g/dL — ABNORMAL HIGH (ref 6.5–8.1)

## 2020-09-14 LAB — URINALYSIS, ROUTINE W REFLEX MICROSCOPIC
Bilirubin Urine: NEGATIVE
Glucose, UA: >=500 mg/dL — AB
Hgb urine dipstick: NEGATIVE
Ketones, ur: >80 mg/dL — AB
Leukocytes,Ua: NEGATIVE
Nitrite: NEGATIVE
Protein, ur: NEGATIVE mg/dL
Specific Gravity, Urine: 1.025 (ref 1.005–1.030)
pH: 5 (ref 5.0–8.0)

## 2020-09-14 LAB — I-STAT VENOUS BLOOD GAS, ED
Acid-base deficit: 19 mmol/L — ABNORMAL HIGH (ref 0.0–2.0)
Bicarbonate: 6.8 mmol/L — ABNORMAL LOW (ref 20.0–28.0)
Calcium, Ion: 1.07 mmol/L — ABNORMAL LOW (ref 1.15–1.40)
HCT: 50 % (ref 39.0–52.0)
Hemoglobin: 17 g/dL (ref 13.0–17.0)
O2 Saturation: 97 %
Potassium: 4.8 mmol/L (ref 3.5–5.1)
Sodium: 132 mmol/L — ABNORMAL LOW (ref 135–145)
TCO2: 7 mmol/L — ABNORMAL LOW (ref 22–32)
pCO2, Ven: 18.1 mmHg — CL (ref 44.0–60.0)
pH, Ven: 7.182 — CL (ref 7.250–7.430)
pO2, Ven: 115 mmHg — ABNORMAL HIGH (ref 32.0–45.0)

## 2020-09-14 LAB — BETA-HYDROXYBUTYRIC ACID: Beta-Hydroxybutyric Acid: 8.56 mmol/L — ABNORMAL HIGH (ref 0.05–0.27)

## 2020-09-14 MED ORDER — ENOXAPARIN SODIUM 40 MG/0.4ML ~~LOC~~ SOLN: 40.0000 mg | SUBCUTANEOUS | Status: DC

## 2020-09-14 MED ORDER — DEXTROSE 50 % IV SOLN: 0.0000 mL | INTRAVENOUS | Status: DC | PRN

## 2020-09-14 MED ORDER — POTASSIUM CHLORIDE 10 MEQ/100ML IV SOLN: 10.0000 meq | INTRAVENOUS | Status: DC

## 2020-09-14 MED ORDER — LACTATED RINGERS IV BOLUS: 1000.0000 mL | INTRAVENOUS | Status: DC

## 2020-09-14 MED ORDER — INSULIN REGULAR(HUMAN) IN NACL 100-0.9 UT/100ML-% IV SOLN: INTRAVENOUS | Status: DC

## 2020-09-14 MED ORDER — POTASSIUM CHLORIDE 10 MEQ/100ML IV SOLN
10.0000 meq | INTRAVENOUS | Status: AC
  Administered 2020-09-14 (×2): 10 meq via INTRAVENOUS
  Filled 2020-09-14 (×2): qty 100

## 2020-09-14 MED ORDER — IOHEXOL 300 MG/ML  SOLN
100.0000 mL | Freq: Once | INTRAMUSCULAR | Status: AC | PRN
  Administered 2020-09-14: 100 mL via INTRAVENOUS

## 2020-09-14 MED ORDER — DEXTROSE IN LACTATED RINGERS 5 % IV SOLN: INTRAVENOUS | Status: DC

## 2020-09-14 MED ORDER — LACTATED RINGERS IV SOLN: INTRAVENOUS | Status: DC

## 2020-09-14 MED ORDER — INSULIN REGULAR(HUMAN) IN NACL 100-0.9 UT/100ML-% IV SOLN
INTRAVENOUS | Status: DC
  Administered 2020-09-14: 10.5 [IU]/h via INTRAVENOUS
  Filled 2020-09-14: qty 100

## 2020-09-14 MED ORDER — LACTATED RINGERS IV BOLUS
1000.0000 mL | INTRAVENOUS | Status: AC
  Administered 2020-09-14 (×2): 1000 mL via INTRAVENOUS

## 2020-09-14 MED ORDER — DEXTROSE-NACL 5-0.45 % IV SOLN: INTRAVENOUS | Status: DC

## 2020-09-14 NOTE — ED Notes (Signed)
Pt requesting to leave AMA. Pt wanted to rip his own IV out, this RN convinced pt to let us take it out but pt refused vital signs stated he was going to use the restroom and leave.
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
## 2020-09-14 NOTE — Progress Notes (Signed)
Inpatient Diabetes Program Recommendations  AACE/ADA: New Consensus Statement on Inpatient Glycemic Control (2015)  Target Ranges:  Prepandial:   less than 140 mg/dL      Peak postprandial:   less than 180 mg/dL (1-2 hours)      Critically ill patients:  140 - 180 mg/dL   Lab Results  Component Value Date   GLUCAP 322 (H) 09/14/2020   HGBA1C 11.7 (H) 05/17/2020    Review of Glycemic Control  Diabetes history: type 1 Outpatient Diabetes medications: Lantus 30 units BID, Novolog 1-15 units TID, 1 unit/gram CHO per sliding scale Current orders for Inpatient glycemic control: IV insulin  Inpatient Diabetes Program Recommendations:   Note that patient is very familiar to our inpatient diabetes team due to frequent admissions.  When ready to transition off IV insulin, recommend Lantus 30 units given 2 hours prior to stopping insulin drip. Add Novolog MODERATE correction scale every 4 hours while NPO and then TID & HS scale (0-5 units) when eating. May need to add Novolog meal coverage of 3 units TID if eats at least 50% of meal.   Will continue to monitor blood sugars while in the hospital.  Smith Mince RN BSN CDE Diabetes Coordinator Pager: [phone]  8am-5pm

## 2020-09-14 NOTE — ED Notes (Signed)
Pt transported to CT ?

## 2020-09-14 NOTE — ED Triage Notes (Signed)
Pt reports his blood sugar keeps on fluctuating. Recently admitted to [facility] for same thing.   Denies any nausea and vomiting but has right flank pain.

## 2020-09-14 NOTE — H&P (Signed)
History and Physical:    [name] [name].   ZOX:[id number] DOB: Jun 29, 2001 DOA: 09/14/2020  Referring MD/provider: PA Marye Round handily PCP: Patient, No Pcp Per   Patient coming from: Home  Chief Complaint: High sugars, nausea and vomiting  History of Present Illness:   [name] [name]. is an 20 y.o. male with PMH significant for poorly controlled DM 1 complicated by multiple episodes of DKA, bipolar disorder, borderline personality disorder, history of methamphetamine use and noncompliance who was in his usual state of health until 1 week ago when he noted that his sugars were rising.  Patient states that he is compliant with his medication and tries to stick to his diet and that his blood sugars normally run 80-120.  Apparently last week his sugars started to rise but he does not know why.  Patient states similar thing happened last month and he was admitted to [facility] in DKA as well.  He does not know why his sugars rise "it might be something I ate at home I do not know".  Patient started to feel increasingly weak and developed nausea and vomiting.  Patient does not know when he developed vomiting and is not really sure how long he has been in the ER.  Patient denies antecedent fevers and chills or malaise.  Patient denies shortness of breath, cough or dysuria no abdominal pain prior to onset of vomiting.  Patient states he has not used any substances other than marijuana recently.  Specifically denies alcohol use.  ED Course:  The patient was noted to be afebrile, tachycardic and slightly hypertensive.  He was noted to be in DKA with initial VBG pH 7.18, bicarb was 7, glucose 553 with creatinine of 1.7.  Abdominal CT and chest x-ray were negative for source of any infection.  Patient was started on insulin drip and called in for admission  ROS:   ROS   Review of Systems: Per HPI  Past Medical History:   Past Medical History:  Diagnosis Date  . ADD (attention deficit  disorder)   . Allergy   . Anxiety   . Asthma   . Depression   . Epileptic seizure ([place])    "both petit and grand mal; last sz ~ 2 wk ago" (06/17/2013)  . Headache(784.0)    "2-3 times/wk usually" (06/17/2013)  . Heart murmur    "heard a slight one earlier today" (06/17/2013)  . Homeless   . Migraine    "maybe once/month; it's severe" (06/17/2013)  . ODD (oppositional defiant disorder)   . Sickle cell trait ([place])   . Type I diabetes mellitus ([place])    "that's what they're thinking now" (06/17/2013)  . Vision abnormalities    "takes him longer to focus cause; from his sz" (06/17/2013)    Past Surgical History:   Past Surgical History:  Procedure Laterality Date  . CIRCUMCISION  2000  . FINGER SURGERY Left 2001   "crushed pinky; had to repair it" (06/17/2013)    Social History:   Social History   Socioeconomic History  . Marital status: Significant Other    Spouse name: Not on file  . Number of children: 1  . Years of education: Not on file  . Highest education level: Not on file  Occupational History  . Occupation: Unemployed  Tobacco Use  . Smoking status: Current Every Day Smoker    Packs/day: 0.50    Types: Cigarettes  . Smokeless tobacco: Never Used  . Tobacco comment:  Mom and dad smoke outside   Vaping Use  . Vaping Use: Former  Substance and Sexual Activity  . Alcohol use: No    Alcohol/week: 0.0 standard drinks  . Drug use: Yes    Frequency: 7.0 times per week    Types: Marijuana, Methamphetamines  . Sexual activity: Yes  Other Topics Concern  . Not on file  Social History Narrative   Lives with significant other/fiancee; followed by [facility]   Social Determinants of Health   Financial Resource Strain: Not on file  Food Insecurity: Not on file  Transportation Needs: Not on file  Physical Activity: Not on file  Stress: Not on file  Social Connections: Not on file  Intimate Partner Violence: Not on file    Allergies   Bee venom, Fish  allergy, Shellfish allergy, and Tea  Family history:   Family History  Problem Relation Age of Onset  . Asthma Mother   . COPD Mother   . Other Mother        possible autoimmune, unclear  . Diabetes Maternal Grandmother   . Heart disease Maternal Grandmother   . Hypertension Maternal Grandmother   . Mental illness Maternal Grandmother   . Heart disease Maternal Grandfather   . Hyperlipidemia Paternal Grandmother   . Hyperlipidemia Paternal Grandfather   . Migraines Sister        Hemiplegic Migraines     Current Medications:   Prior to Admission medications   Medication Sig Start Date End Date Taking? Authorizing Provider  albuterol (PROVENTIL HFA;VENTOLIN HFA) 108 (90 Base) MCG/ACT inhaler Inhale 1 puff into the lungs every 4 (four) hours as needed for wheezing or shortness of breath. 09/16/18   Ghimire, Henreitta Leber, MD  ARIPiprazole (ABILIFY) 5 MG tablet Take 5 mg by mouth daily. 09/27/19   [provider]  blood glucose meter kit and supplies KIT Dispense based on patient and insurance preference. Use up to four times daily as directed. (FOR ICD-9 250.00, 250.01). 05/11/20   Ward, Delice Bison, DO  buPROPion (WELLBUTRIN SR) 150 MG 12 hr tablet Take 150 mg by mouth daily. 09/27/19   [provider]  clotrimazole (LOTRIMIN) 1 % cream Apply to affected area 2 times daily 09/18/19   Wieters, Hallie C, PA-C  docusate sodium (COLACE) 100 MG capsule Take 1 capsule (100 mg total) by mouth every 12 (twelve) hours. 05/11/20   Ward, Delice Bison, DO  EPINEPHrine 0.3 mg/0.3 mL IJ SOAJ injection Inject 0.3 mg into the muscle as directed. Call 911 05/02/20   [provider]  FLOVENT HFA 110 MCG/ACT inhaler Inhale 2 puffs into the lungs in the morning and at bedtime. 10/12/19   [provider]  FLUoxetine (PROZAC) 20 MG capsule Take 1 capsule (20 mg total) by mouth daily. Patient not taking: Reported on 05/17/2020 09/20/18   Patrecia Pour, NP  gabapentin (NEURONTIN) 300  MG capsule Take 300 mg by mouth daily.  01/11/20   [provider]  glucose blood (FREESTYLE TEST STRIPS) test strip Use as instructed 05/11/20   Ward, Delice Bison, DO  ibuprofen (ADVIL) 800 MG tablet Take 1 tablet (800 mg total) by mouth every 8 (eight) hours as needed for mild pain. 05/11/20   Ward, Delice Bison, DO  insulin aspart (NOVOLOG FLEXPEN) 100 UNIT/ML FlexPen Inject 1-15 Units into the skin 3 (three) times daily with meals. 1 unit/g of carbohydrate sliding scale. Patient taking differently: Inject 1-15 Units into the skin 3 (three) times daily with meals.  1 unit/g of carbohydrate per sliding scale. 05/11/20   Ward, Delice Bison, DO  insulin glargine (LANTUS) 100 UNIT/ML Solostar Pen Inject 30 Units into the skin 2 (two) times daily. 05/11/20   Ward, Kristen N, DO  LATUDA 20 MG TABS tablet Take 20 mg by mouth daily.  01/11/20   [provider]  ondansetron (ZOFRAN ODT) 4 MG disintegrating tablet Take 1 tablet (4 mg total) by mouth every 6 (six) hours as needed. Patient taking differently: Take 4 mg by mouth every 6 (six) hours as needed for nausea.  05/11/20   Ward, Delice Bison, DO  oxyCODONE (ROXICODONE) 5 MG immediate release tablet Take 1 tablet (5 mg total) by mouth every 6 (six) hours as needed for severe pain. 05/11/20   Ward, Delice Bison, DO    Physical Exam:   Vitals:   09/14/20 1015 09/14/20 1030 09/14/20 1145 09/14/20 1200  BP: 130/84 (!) 141/87 133/80 122/73  Pulse: (!) 102 (!) 107 97 (!) 108  Resp: (!) 24 (!) 21 20 (!) 31  Temp:      TempSrc:      SpO2: 99% 99% 98% 99%     Physical Exam: Blood pressure 122/73, pulse (!) 108, temperature 98.4 F (36.9 C), temperature source Oral, resp. rate (!) 31, SpO2 99 %. Gen: Overweight male lying in bed complaining about IV in arm.  He is awake and alert and answering questions appropriately. Eyes: sclera anicteric, conjuctiva mildly injected bilaterally CVS: S1-S2, regulary, no gallops Respiratory:  decreased air entry  likely secondary to decreased inspiratory effort GI: NABS, soft, NT  LE: No edema. No cyanosis Neuro:  grossly nonfocal.  Psych: Patient has poor insight, is irritable  skin: no rashes or lesions or ulcers,    Data Review:    Labs: Basic Metabolic Panel: Recent Labs  Lab 09/14/20 0506 09/14/20 0908 09/14/20 0949  NA 127* 131* 132*  K 4.1 4.8 4.8  CL 94* 98  --   CO2 10* 7*  --   GLUCOSE 527* 553*  --   BUN 12 11  --   CREATININE 1.47* 1.74*  --   CALCIUM 8.8* 8.8*  --    Liver Function Tests: Recent Labs  Lab 09/14/20 0908  AST 37  ALT 50*  ALKPHOS 115  BILITOT 5.0*  PROT 8.2*  ALBUMIN 4.1   Recent Labs  Lab 09/14/20 0908  LIPASE 22   No results for input(s): AMMONIA in the last 168 hours. CBC: Recent Labs  Lab 09/14/20 0506 09/14/20 0949  WBC 12.9*  --   HGB 15.6 17.0  HCT 44.0 50.0  MCV 79.6*  --   PLT 294  --    Cardiac Enzymes: No results for input(s): CKTOTAL, CKMB, CKMBINDEX, TROPONINI in the last 168 hours.  BNP (last 3 results) No results for input(s): PROBNP in the last 8760 hours. CBG: Recent Labs  Lab 09/14/20 0458 09/14/20 0914 09/14/20 1009 09/14/20 1048 09/14/20 1150  GLUCAP 504* 548* 442* 347* 322*    Urinalysis    Component Value Date/Time   COLORURINE YELLOW 05/11/2020 0207   APPEARANCEUR CLEAR 05/11/2020 0207   LABSPEC 1.031 (H) 05/11/2020 0207   PHURINE 5.0 05/11/2020 0207   GLUCOSEU >=500 (A) 05/11/2020 0207   HGBUR SMALL (A) 05/11/2020 0207   BILIRUBINUR NEGATIVE 05/11/2020 0207   KETONESUR 5 (A) 05/11/2020 0207   PROTEINUR 30 (A) 05/11/2020 0207   UROBILINOGEN 0.2 09/18/2019 1339   NITRITE NEGATIVE 05/11/2020 0207   LEUKOCYTESUR NEGATIVE 05/11/2020  0207      Radiographic Studies: CT ABDOMEN PELVIS W CONTRAST  Result Date: 09/14/2020 CLINICAL DATA:  Acute generalized abdominal pain. EXAM: CT ABDOMEN AND PELVIS WITH CONTRAST TECHNIQUE: Multidetector CT imaging of the abdomen and pelvis was performed using  the standard protocol following bolus administration of intravenous contrast. CONTRAST:  144m OMNIPAQUE IOHEXOL 300 MG/ML  SOLN COMPARISON:  May 11, 2020. FINDINGS: Lower chest: No acute abnormality. Hepatobiliary: No focal liver abnormality is seen. No gallstones, gallbladder wall thickening, or biliary dilatation. Pancreas: Unremarkable. No pancreatic ductal dilatation or surrounding inflammatory changes. Spleen: Normal in size without focal abnormality. Adrenals/Urinary Tract: Adrenal glands are unremarkable. Kidneys are normal, without renal calculi, focal lesion, or hydronephrosis. Bladder is unremarkable. Stomach/Bowel: Stomach is within normal limits. Appendix appears normal. No evidence of bowel wall thickening, distention, or inflammatory changes. Vascular/Lymphatic: No significant vascular findings are present. No enlarged abdominal or pelvic lymph nodes. Reproductive: Prostate is unremarkable. Other: No abdominal wall hernia or abnormality. No abdominopelvic ascites. Musculoskeletal: No acute or significant osseous findings. IMPRESSION: No abnormality seen in the abdomen or pelvis. Electronically Signed   By: JMarijo ConceptionM.D.   On: 09/14/2020 11:46   DG Chest Portable 1 View  Result Date: 09/14/2020 CLINICAL DATA:  Short of breath.  Hyperglycemia EXAM: PORTABLE CHEST 1 VIEW COMPARISON:  05/10/2020 FINDINGS: Reverse apical lordotic positioning. Midline trachea. Normal heart size. No pleural effusion or pneumothorax. Clear lungs. Numerous leads and wires project over the chest. IMPRESSION: No acute cardiopulmonary disease. Electronically Signed   By: KAbigail MiyamotoM.D.   On: 09/14/2020 09:47    EKG: Independently reviewed.  Sinus tach at 111.  Normal intervals.  Normal axis.  T wave inversions 3 and F.  Nonspecific ST-T wave changes.   Assessment/Plan:   Principal Problem:   DKA, type 1 (HCC) Active Problems:   Non compliance w medication regimen   History of seizures   Borderline  personality disorder (HCC)   Bipolar disorder (HCC)   Substance use disorder   20year old is readmitted with DKA with unclear precipitant, possible secondary to noncompliance.  DKA Will place patient on insulin drip protocol for DKA Given rise in creatinine, will repeat LR bolus  AKI Likely secondary to prerenal causes with decreased intravascular volume Aggressive fluid resuscitation as above Will be following closely  Bipolar disorder Start meds once they have been reconciled and confirmed   Other information:   DVT prophylaxis: Lovenox ordered. Code Status: Full Family Communication: Patient's wife was on the phone Disposition Plan: Home Consults called: Diabetes coordinator Admission status: Observation  Srobona Tublu Chatterjee [facility]  If 7PM-7AM, please contact night-coverage [email or web address] Password [facility]3/18/2022, 12:50 PM

## 2020-09-14 NOTE — ED Provider Notes (Signed)
I provided a substantive portion of the care of this patient.  I personally performed the entirety of the exam for this encounter.  EKG Interpretation  Date/Time:  Friday September 14 2020 08:51:46 EDT Ventricular Rate:  111 PR Interval:    QRS Duration: 89 QT Interval:  315 QTC Calculation: 428 R Axis:   53 Text Interpretation: Sinus tachycardia Borderline T abnormalities, inferior leads Borderline ST elevation, anterior leads similar to previous but dynamic normalization of lateral T wave  compared to previous Confirmed by Arby Barrette [phone]) on 09/14/2020 1:36:13 PM  Patient came to the emergency department with persistent vomiting and abdominal pain.  He reports he has been using his insulin but blood sugars are elevating.  Patient is confused and lethargic in appearance.  He smells strongly of ketones.  He is tachypneic.  Mucous membranes dry.  Heart is tachycardic.  Lungs are grossly clear.  Abdomen is soft but endorses tenderness throughout.  Lower extremities without any wounds or peripheral edema.  Skin is warm and dry but mildly diaphoretic.  Patient is confused but can follow some commands.  Patient is severely ill in appearance with severe DKA.  Insulin drip and fluid resuscitation initiated.  Over the course of several hours, patient has made significant improvement in terms of level of consciousness.  At the time when he regained orientation and normal mental capacity for conversation, patient is insisting on signing out AGAINST MEDICAL ADVICE.  Patient reports that he has to get to his son's first birthday party.  He reports that if he has to leave and come back later this evening he will do that.  Patient is counseled that he is still in DKA with risk of death or permanent brain damage.  Patient is counseled on the nature of encephalopathy that can occur without resolution due to DKA in the resuscitation of DKA.  He is insistent on the importance of leaving.  Patient is counseled  on the importance of being present for his son and his years of growing up and the importance of maintaining his own health care at this juncture in order to be able to provide that support for long-term.  Patient voices understanding however is insistent that it is critical he leave the hospital now.  His speech is clear, he is oriented to person place and time.  At this time, he has capacity for medical decision-making.  Patient is extensively counseled on persistence of DKA at this time and that he is not cleared in terms of medical stability.  Accepts risk.  CRITICAL CARE Performed by: Arby Barrette   Total critical care time:30 minutes  Critical care time was exclusive of separately billable procedures and treating other patients.  Critical care was necessary to treat or prevent imminent or life-threatening deterioration.  Critical care was time spent personally by me on the following activities: development of treatment plan with patient and/or surrogate as well as nursing, discussions with consultants, evaluation of patient's response to treatment, examination of patient, obtaining history from patient or surrogate, ordering and performing treatments and interventions, ordering and review of laboratory studies, ordering and review of radiographic studies, pulse oximetry and re-evaluation of patient's condition.   Arby Barrette, MD 09/14/20 [phone]

## 2020-11-09 ENCOUNTER — Other Ambulatory Visit: Payer: Self-pay

## 2020-11-09 ENCOUNTER — Observation Stay (HOSPITAL_BASED_OUTPATIENT_CLINIC_OR_DEPARTMENT_OTHER)
Admission: EM | Admit: 2020-11-09 | Discharge: 2020-11-09 | Discharge status: Against Medical Advice | Payer: Medicaid Other | Source: Home / Self Care | Attending: Emergency Medicine | Admitting: Emergency Medicine

## 2020-11-09 ENCOUNTER — Encounter (HOSPITAL_COMMUNITY): Payer: Self-pay

## 2020-11-09 DIAGNOSIS — Z20822 Contact with and (suspected) exposure to covid-19: Secondary | ICD-10-CM | POA: Diagnosis present

## 2020-11-09 DIAGNOSIS — Z794 Long term (current) use of insulin: Secondary | ICD-10-CM

## 2020-11-09 DIAGNOSIS — E1010 Type 1 diabetes mellitus with ketoacidosis without coma: Secondary | ICD-10-CM | POA: Diagnosis present

## 2020-11-09 DIAGNOSIS — J45909 Unspecified asthma, uncomplicated: Secondary | ICD-10-CM | POA: Diagnosis present

## 2020-11-09 DIAGNOSIS — Z91030 Bee allergy status: Secondary | ICD-10-CM

## 2020-11-09 DIAGNOSIS — R739 Hyperglycemia, unspecified: Secondary | ICD-10-CM

## 2020-11-09 DIAGNOSIS — R011 Cardiac murmur, unspecified: Secondary | ICD-10-CM | POA: Diagnosis present

## 2020-11-09 DIAGNOSIS — F988 Other specified behavioral and emotional disorders with onset usually occurring in childhood and adolescence: Secondary | ICD-10-CM | POA: Diagnosis present

## 2020-11-09 DIAGNOSIS — F1990 Other psychoactive substance use, unspecified, uncomplicated: Secondary | ICD-10-CM | POA: Diagnosis present

## 2020-11-09 DIAGNOSIS — Z87891 Personal history of nicotine dependence: Secondary | ICD-10-CM

## 2020-11-09 DIAGNOSIS — D573 Sickle-cell trait: Secondary | ICD-10-CM | POA: Diagnosis present

## 2020-11-09 DIAGNOSIS — Z9114 Patient's other noncompliance with medication regimen: Secondary | ICD-10-CM

## 2020-11-09 DIAGNOSIS — Z79899 Other long term (current) drug therapy: Secondary | ICD-10-CM

## 2020-11-09 DIAGNOSIS — Z91018 Allergy to other foods: Secondary | ICD-10-CM

## 2020-11-09 DIAGNOSIS — Z833 Family history of diabetes mellitus: Secondary | ICD-10-CM

## 2020-11-09 DIAGNOSIS — Z825 Family history of asthma and other chronic lower respiratory diseases: Secondary | ICD-10-CM

## 2020-11-09 DIAGNOSIS — F1510 Other stimulant abuse, uncomplicated: Secondary | ICD-10-CM | POA: Diagnosis present

## 2020-11-09 DIAGNOSIS — Z91013 Allergy to seafood: Secondary | ICD-10-CM

## 2020-11-09 DIAGNOSIS — F1520 Other stimulant dependence, uncomplicated: Secondary | ICD-10-CM | POA: Diagnosis not present

## 2020-11-09 DIAGNOSIS — F913 Oppositional defiant disorder: Secondary | ICD-10-CM | POA: Diagnosis present

## 2020-11-09 DIAGNOSIS — F1910 Other psychoactive substance abuse, uncomplicated: Secondary | ICD-10-CM | POA: Diagnosis present

## 2020-11-09 LAB — BASIC METABOLIC PANEL
Anion gap: 18 — ABNORMAL HIGH (ref 5–15)
Anion gap: 16 — ABNORMAL HIGH (ref 5–15)
Anion gap: 10 (ref 5–15)
Anion gap: 10 (ref 5–15)
BUN: 10 mg/dL (ref 6–20)
BUN: 7 mg/dL (ref 6–20)
BUN: 6 mg/dL (ref 6–20)
BUN: <5 mg/dL — ABNORMAL LOW (ref 6–20)
CO2: 18 mmol/L — ABNORMAL LOW (ref 22–32)
CO2: 19 mmol/L — ABNORMAL LOW (ref 22–32)
CO2: 25 mmol/L (ref 22–32)
CO2: 25 mmol/L (ref 22–32)
Calcium: 9.2 mg/dL (ref 8.9–10.3)
Calcium: 8.8 mg/dL — ABNORMAL LOW (ref 8.9–10.3)
Calcium: 8.6 mg/dL — ABNORMAL LOW (ref 8.9–10.3)
Calcium: 9.1 mg/dL (ref 8.9–10.3)
Chloride: 96 mmol/L — ABNORMAL LOW (ref 98–111)
Chloride: 98 mmol/L (ref 98–111)
Chloride: 101 mmol/L (ref 98–111)
Chloride: 100 mmol/L (ref 98–111)
Creatinine, Ser: 0.9 mg/dL (ref 0.61–1.24)
Creatinine, Ser: 0.7 mg/dL (ref 0.61–1.24)
Creatinine, Ser: 0.63 mg/dL (ref 0.61–1.24)
Creatinine, Ser: 0.61 mg/dL (ref 0.61–1.24)
GFR, Estimated: >60 mL/min (ref >60)
GFR, Estimated: >60 mL/min (ref >60)
GFR, Estimated: >60 mL/min (ref >60)
GFR, Estimated: >60 mL/min (ref >60)
Glucose, Bld: 460 mg/dL — ABNORMAL HIGH (ref 70–99)
Glucose, Bld: 355 mg/dL — ABNORMAL HIGH (ref 70–99)
Glucose, Bld: 148 mg/dL — ABNORMAL HIGH (ref 70–99)
Glucose, Bld: 192 mg/dL — ABNORMAL HIGH (ref 70–99)
Potassium: 4.5 mmol/L (ref 3.5–5.1)
Potassium: 4.5 mmol/L (ref 3.5–5.1)
Potassium: 3.3 mmol/L — ABNORMAL LOW (ref 3.5–5.1)
Potassium: 3.5 mmol/L (ref 3.5–5.1)
Sodium: 132 mmol/L — ABNORMAL LOW (ref 135–145)
Sodium: 133 mmol/L — ABNORMAL LOW (ref 135–145)
Sodium: 136 mmol/L (ref 135–145)
Sodium: 135 mmol/L (ref 135–145)

## 2020-11-09 LAB — CBC WITH DIFFERENTIAL/PLATELET
Abs Immature Granulocytes: 0.07 10*3/uL (ref 0.00–0.07)
Basophils Absolute: 0 10*3/uL (ref 0.0–0.1)
Basophils Relative: 1 %
Eosinophils Absolute: 0 10*3/uL (ref 0.0–0.5)
Eosinophils Relative: 0 %
HCT: 45.7 % (ref 39.0–52.0)
Hemoglobin: 15.3 g/dL (ref 13.0–17.0)
Immature Granulocytes: 1 %
Lymphocytes Relative: 17 %
Lymphs Abs: 1.4 10*3/uL (ref 0.7–4.0)
MCH: 28.2 pg (ref 26.0–34.0)
MCHC: 33.5 g/dL (ref 30.0–36.0)
MCV: 84.2 fL (ref 80.0–100.0)
Monocytes Absolute: 0.5 10*3/uL (ref 0.1–1.0)
Monocytes Relative: 7 %
Neutro Abs: 5.9 10*3/uL (ref 1.7–7.7)
Neutrophils Relative %: 74 %
Platelets: 276 10*3/uL (ref 150–400)
RBC: 5.43 MIL/uL (ref 4.22–5.81)
RDW: 12.5 % (ref 11.5–15.5)
WBC: 7.9 10*3/uL (ref 4.0–10.5)
nRBC: 0 % (ref 0.0–0.2)

## 2020-11-09 LAB — GLUCOSE, CAPILLARY
Glucose-Capillary: 136 mg/dL — ABNORMAL HIGH (ref 70–99)
Glucose-Capillary: 103 mg/dL — ABNORMAL HIGH (ref 70–99)
Glucose-Capillary: 136 mg/dL — ABNORMAL HIGH (ref 70–99)
Glucose-Capillary: 148 mg/dL — ABNORMAL HIGH (ref 70–99)
Glucose-Capillary: 165 mg/dL — ABNORMAL HIGH (ref 70–99)
Glucose-Capillary: 159 mg/dL — ABNORMAL HIGH (ref 70–99)
Glucose-Capillary: 145 mg/dL — ABNORMAL HIGH (ref 70–99)
Glucose-Capillary: 175 mg/dL — ABNORMAL HIGH (ref 70–99)
Glucose-Capillary: 162 mg/dL — ABNORMAL HIGH (ref 70–99)

## 2020-11-09 LAB — URINALYSIS, COMPLETE (UACMP) WITH MICROSCOPIC
Bacteria, UA: NONE SEEN
Bilirubin Urine: NEGATIVE
Glucose, UA: >=500 mg/dL — AB
Hgb urine dipstick: NEGATIVE
Ketones, ur: 80 mg/dL — AB
Leukocytes,Ua: NEGATIVE
Nitrite: NEGATIVE
Protein, ur: NEGATIVE mg/dL
Specific Gravity, Urine: 1.024 (ref 1.005–1.030)
pH: 6 (ref 5.0–8.0)

## 2020-11-09 LAB — BLOOD GAS, VENOUS
Acid-base deficit: 4.8 mmol/L — ABNORMAL HIGH (ref 0.0–2.0)
Bicarbonate: 19.3 mmol/L — ABNORMAL LOW (ref 20.0–28.0)
O2 Saturation: 80.6 %
Patient temperature: 98.6
pCO2, Ven: 34.7 mmHg — ABNORMAL LOW (ref 44.0–60.0)
pH, Ven: 7.365 (ref 7.250–7.430)
pO2, Ven: 49.7 mmHg — ABNORMAL HIGH (ref 32.0–45.0)

## 2020-11-09 LAB — OSMOLALITY: Osmolality: 307 mOsm/kg — ABNORMAL HIGH (ref 275–295)

## 2020-11-09 LAB — RAPID URINE DRUG SCREEN, HOSP PERFORMED
Amphetamines: POSITIVE — AB
Barbiturates: NOT DETECTED
Benzodiazepines: NOT DETECTED
Cocaine: NOT DETECTED
Opiates: NOT DETECTED
Tetrahydrocannabinol: NOT DETECTED

## 2020-11-09 LAB — CBG MONITORING, ED
Glucose-Capillary: 450 mg/dL — ABNORMAL HIGH (ref 70–99)
Glucose-Capillary: 441 mg/dL — ABNORMAL HIGH (ref 70–99)
Glucose-Capillary: 360 mg/dL — ABNORMAL HIGH (ref 70–99)
Glucose-Capillary: 232 mg/dL — ABNORMAL HIGH (ref 70–99)

## 2020-11-09 LAB — BETA-HYDROXYBUTYRIC ACID
Beta-Hydroxybutyric Acid: 5.31 mmol/L — ABNORMAL HIGH (ref 0.05–0.27)
Beta-Hydroxybutyric Acid: 2.91 mmol/L — ABNORMAL HIGH (ref 0.05–0.27)
Beta-Hydroxybutyric Acid: 1.94 mmol/L — ABNORMAL HIGH (ref 0.05–0.27)

## 2020-11-09 LAB — HEMOGLOBIN A1C
Hgb A1c MFr Bld: 10.5 % — ABNORMAL HIGH (ref 4.8–5.6)
Mean Plasma Glucose: 254.65 mg/dL

## 2020-11-09 LAB — RESP PANEL BY RT-PCR (FLU A&B, COVID) ARPGX2
Influenza A by PCR: NEGATIVE
Influenza B by PCR: NEGATIVE
SARS Coronavirus 2 by RT PCR: NEGATIVE

## 2020-11-09 LAB — ETHANOL: Alcohol, Ethyl (B): <10 mg/dL (ref <10)

## 2020-11-09 LAB — MRSA PCR SCREENING: MRSA by PCR: NEGATIVE

## 2020-11-09 MED ORDER — FREESTYLE TEST VI STRP: ORAL_STRIP | 0 refills | Status: DC

## 2020-11-09 MED ORDER — INSULIN REGULAR(HUMAN) IN NACL 100-0.9 UT/100ML-% IV SOLN
INTRAVENOUS | Status: DC
  Administered 2020-11-09: 13 [IU]/h via INTRAVENOUS
  Filled 2020-11-09: qty 100

## 2020-11-09 MED ORDER — CHLORHEXIDINE GLUCONATE CLOTH 2 % EX PADS
6.0000 | MEDICATED_PAD | Freq: Every day | CUTANEOUS | Status: DC
  Administered 2020-11-09: 6 via TOPICAL

## 2020-11-09 MED ORDER — INSULIN GLARGINE 100 UNIT/ML SOLOSTAR PEN: 30.0000 [IU] | PEN_INJECTOR | Freq: Two times a day (BID) | SUBCUTANEOUS | 6 refills | Status: DC

## 2020-11-09 MED ORDER — LACTATED RINGERS IV SOLN: INTRAVENOUS | Status: DC

## 2020-11-09 MED ORDER — ADVOCATE INSULIN PEN NEEDLES 29G X 12.7MM MISC: 3 refills | Status: DC

## 2020-11-09 MED ORDER — POTASSIUM CHLORIDE 10 MEQ/100ML IV SOLN
10.0000 meq | INTRAVENOUS | Status: AC
  Administered 2020-11-09 (×2): 10 meq via INTRAVENOUS
  Filled 2020-11-09 (×2): qty 100

## 2020-11-09 MED ORDER — LORAZEPAM 2 MG/ML IJ SOLN
1.0000 mg | Freq: Once | INTRAMUSCULAR | Status: AC
  Administered 2020-11-09: 1 mg via INTRAVENOUS
  Filled 2020-11-09: qty 1

## 2020-11-09 MED ORDER — LACTATED RINGERS IV BOLUS
20.0000 mL/kg | Freq: Once | INTRAVENOUS | Status: AC
  Administered 2020-11-09: 2100 mL via INTRAVENOUS

## 2020-11-09 MED ORDER — ORAL CARE MOUTH RINSE: 15.0000 mL | Freq: Two times a day (BID) | OROMUCOSAL | Status: DC

## 2020-11-09 MED ORDER — DEXTROSE 50 % IV SOLN: 0.0000 mL | INTRAVENOUS | Status: DC | PRN

## 2020-11-09 MED ORDER — DEXTROSE IN LACTATED RINGERS 5 % IV SOLN: INTRAVENOUS | Status: DC

## 2020-11-09 MED ORDER — INSULIN GLARGINE 100 UNIT/ML ~~LOC~~ SOLN
30.0000 [IU] | Freq: Every day | SUBCUTANEOUS | Status: DC
  Filled 2020-11-09: qty 0.3

## 2020-11-09 MED ORDER — BLOOD GLUCOSE MONITOR KIT: PACK | 0 refills | Status: DC

## 2020-11-09 MED ORDER — NOVOLOG FLEXPEN 100 UNIT/ML ~~LOC~~ SOPN: 1.0000 [IU] | PEN_INJECTOR | Freq: Three times a day (TID) | SUBCUTANEOUS | 6 refills | Status: DC

## 2020-11-09 MED ORDER — ENOXAPARIN SODIUM 40 MG/0.4ML IJ SOSY
40.0000 mg | PREFILLED_SYRINGE | INTRAMUSCULAR | Status: DC
  Administered 2020-11-09: 40 mg via SUBCUTANEOUS
  Filled 2020-11-09: qty 0.4

## 2020-11-09 NOTE — Progress Notes (Signed)
Inpatient Diabetes Program Recommendations  AACE/ADA: New Consensus Statement on Inpatient Glycemic Control (2015)  Target Ranges:  Prepandial:   less than 140 mg/dL      Peak postprandial:   less than 180 mg/dL (1-2 hours)      Critically ill patients:  140 - 180 mg/dL   Results for [name] [name], [name] [name] (MRN [id number]) as of 11/09/2020 06:49  Ref. Range 11/09/2020 04:37  Sodium Latest Ref Range: 135 - 145 mmol/L 132 (L)  Potassium Latest Ref Range: 3.5 - 5.1 mmol/L 4.5  Chloride Latest Ref Range: 98 - 111 mmol/L 96 (L)  CO2 Latest Ref Range: 22 - 32 mmol/L 18 (L)  Glucose Latest Ref Range: 70 - 99 mg/dL 621 (H)  BUN Latest Ref Range: 6 - 20 mg/dL 10  Creatinine Latest Ref Range: 0.61 - 1.24 mg/dL 3.08  Calcium Latest Ref Range: 8.9 - 10.3 mg/dL 9.2  Anion gap Latest Ref Range: 5 - 15  18 (H)   Results for [name] [name], [name] [name] (MRN [id number]) as of 11/09/2020 06:49  Ref. Range 11/09/2020 04:37  Beta-Hydroxybutyric Acid Latest Ref Range: 0.05 - 0.27 mmol/L 5.31 (H)   Results for [name] [name], [name] [name] (MRN [id number]) as of 11/09/2020 06:49  Ref. Range 11/09/2020 02:55 11/09/2020 03:58 11/09/2020 06:04  Glucose-Capillary Latest Ref Range: 70 - 99 mg/dL 401 (H) 027 (H) 253 (H)  IV Insulin drip Started @6 :14am    Admit with: DKA "Cant remember last time he took insulin or checked BGL. Also reports methamphetamine abuse.  Took "a lot" of meth today."  History: Type 1 Diabetes, IVDU with methamphetamine  Home DM Meds: Novolog 1-15 units TID (takes Novolog based on Carbohydrates consumed)       Lantus 30 units BID  Current Orders: IV Insulin Drip     Well known to the Inpatient Diabetes Team--Frequent admissions/ ED visits for DKA--left AMA during last DKA episode (09/14/2020) 4 admissions 2021 13 ED visits since January 2021   --Note IV Insulin Drip just started this AM at 6:14am --Please leave on the IV Insulin Drip until CO2 at least 20 or higher and Anion Gap at least 12 or  less --Once pt reaches these goals, please remember to administer Basal Insulin at least 1 hour prior to d/c of the IV Insulin Drip    --Will follow patient during hospitalization--  February 2021 RN, MSN, CDE Diabetes Coordinator Inpatient Glycemic Control Team Team Pager: [phone] (8a-5p)

## 2020-11-09 NOTE — Progress Notes (Signed)
Patient stating that he would like to leave AMA. Education provided, MD made aware. Patient refused to be transitioned off the insulin drip with lantus, stated that he did not want to stay regardless of what the plan of care was. Patient signed AMA form at 15. Patient refused to receive discharge instruction because he "had to leave" because his ride was here. Patient was informed that MD had sent over prescriptions to [facility] in [place] (location verbalized by patient). PIVs removed at 1900. This RN walked with patient to ED exit where patient's ride was meeting him at about 1913. Patient ambulated safely to exit.

## 2020-11-09 NOTE — H&P (Signed)
History and Physical    [name] [name]. MWN:[id number] DOB: Dec 04, 2000 DOA: 11/09/2020  PCP: Patient, No Pcp Per (Inactive)  Patient coming from: Home  I have personally briefly reviewed patient's old medical records in [place]  Chief Complaint: Hyperglycemia  HPI: [name] [name]. is a 20 y.o. male with medical history significant of DM1, IVDU with methamphetamine.  Pt presents to ED with c/o hyperglycemia for past several days.  He reportes he smells keytones.  Cant remember last time he took insulin or checked BGL.  Also reports methamphetamine abuse.  Took "a lot" of meth today.  Not eating or sleeping due to meth use.  Is injecting meth.  Denies EtOH.  No fevers, chills, headache, neck pain.  Pt binging since his friend committed suicide 3 days ago by cutting his rist.  Pt denies SI/HI.   ED Course: DKA with AG 18.  BGL 460.  BHB of 5.31, 80 keytones in urine.   Review of Systems: As per HPI, otherwise all review of systems negative.  Past Medical History:  Diagnosis Date  . ADD (attention deficit disorder)   . Allergy   . Anxiety   . Asthma   . Depression   . Epileptic seizure ([place])    "both petit and grand mal; last sz ~ 2 wk ago" (06/17/2013)  . Headache(784.0)    "2-3 times/wk usually" (06/17/2013)  . Heart murmur    "heard a slight one earlier today" (06/17/2013)  . Homeless   . Migraine    "maybe once/month; it's severe" (06/17/2013)  . ODD (oppositional defiant disorder)   . Sickle cell trait ([place])   . Type I diabetes mellitus ([place])    "that's what they're thinking now" (06/17/2013)  . Vision abnormalities    "takes him longer to focus cause; from his sz" (06/17/2013)    Past Surgical History:  Procedure Laterality Date  . CIRCUMCISION  2000  . FINGER SURGERY Left 2001   "crushed pinky; had to repair it" (06/17/2013)     reports that he has been smoking cigarettes. He has been smoking about 0.50 packs per day. He has never  used smokeless tobacco. He reports current drug use. Frequency: 7.00 times per week. Drugs: Marijuana and Methamphetamines. He reports that he does not drink alcohol.  Allergies  Allergen Reactions  . Bee Venom Anaphylaxis  . Fish Allergy Anaphylaxis  . Shellfish Allergy Anaphylaxis  . Tea Anaphylaxis    Family History  Problem Relation Age of Onset  . Asthma Mother   . COPD Mother   . Other Mother        possible autoimmune, unclear  . Diabetes Maternal Grandmother   . Heart disease Maternal Grandmother   . Hypertension Maternal Grandmother   . Mental illness Maternal Grandmother   . Heart disease Maternal Grandfather   . Hyperlipidemia Paternal Grandmother   . Hyperlipidemia Paternal Grandfather   . Migraines Sister        Hemiplegic Migraines      Prior to Admission medications   Medication Sig Start Date End Date Taking? Authorizing Provider  insulin aspart (NOVOLOG FLEXPEN) 100 UNIT/ML FlexPen Inject 1-15 Units into the skin 3 (three) times daily with meals. 1 unit/g of carbohydrate sliding scale. Patient taking differently: Inject 1-15 Units into the skin 3 (three) times daily with meals. 1 unit/g of carbohydrate per sliding scale. 05/11/20  Yes Ward, Cyril Mourning N, DO  insulin glargine (LANTUS) 100 UNIT/ML Solostar Pen Inject 30 Units into  the skin 2 (two) times daily. 05/11/20  Yes Ward, Kristen N, DO  albuterol (PROVENTIL HFA;VENTOLIN HFA) 108 (90 Base) MCG/ACT inhaler Inhale 1 puff into the lungs every 4 (four) hours as needed for wheezing or shortness of breath. Patient not taking: Reported on 11/09/2020 09/16/18   Jonetta Osgood, MD  blood glucose meter kit and supplies KIT Dispense based on patient and insurance preference. Use up to four times daily as directed. (FOR ICD-9 250.00, 250.01). 05/11/20   Ward, Delice Bison, DO  EPINEPHrine 0.3 mg/0.3 mL IJ SOAJ injection Inject 0.3 mg into the muscle as directed. Call 911 05/02/20   [provider]  glucose blood  (FREESTYLE TEST STRIPS) test strip Use as instructed 05/11/20   Ward, Delice Bison, DO    Physical Exam: Vitals:   11/09/20 0306 11/09/20 0428 11/09/20 0500 11/09/20 0530  BP:  (!) 172/104 (!) 156/101 (!) 158/99  Pulse:  (!) 114 (!) 119 (!) 109  Resp:  19 (!) 21 (!) 27  Temp:      SpO2:  100% 99% 97%  Weight: 105 kg     Height: _0  (1.778 m)       Constitutional: NAD, calm, comfortable Eyes: PERRL, lids and conjunctivae normal ENMT: Mucous membranes are moist. Posterior pharynx clear of any exudate or lesions.Normal dentition.  Neck: normal, supple, no masses, no thyromegaly Respiratory: clear to auscultation bilaterally, no wheezing, no crackles. Normal respiratory effort. No accessory muscle use.  Cardiovascular: Regular rate and rhythm, no murmurs / rubs / gallops. No extremity edema. 2+ pedal pulses. No carotid bruits.  Abdomen: no tenderness, no masses palpated. No hepatosplenomegaly. Bowel sounds positive.  Musculoskeletal: no clubbing / cyanosis. No joint deformity upper and lower extremities. Good ROM, no contractures. Normal muscle tone.  Skin: no rashes, lesions, ulcers. No induration Neurologic: CN 2-12 grossly intact. Sensation intact, DTR normal. Strength 5/5 in all 4.  Psychiatric: Normal judgment and insight. Alert and oriented x 3. Normal mood.    Labs on Admission: I have personally reviewed following labs and imaging studies  CBC: Recent Labs  Lab 11/09/20 0437  WBC 7.9  NEUTROABS 5.9  HGB 15.3  HCT 45.7  MCV 84.2  PLT 712   Basic Metabolic Panel: Recent Labs  Lab 11/09/20 0437  NA 132*  K 4.5  CL 96*  CO2 18*  GLUCOSE 460*  BUN 10  CREATININE 0.90  CALCIUM 9.2   GFR: Estimated Creatinine Clearance: 157.6 mL/min (by C-G formula based on SCr of 0.9 mg/dL). Liver Function Tests: No results for input(s): AST, ALT, ALKPHOS, BILITOT, PROT, ALBUMIN in the last 168 hours. No results for input(s): LIPASE, AMYLASE in the last 168 hours. No results  for input(s): AMMONIA in the last 168 hours. Coagulation Profile: No results for input(s): INR, PROTIME in the last 168 hours. Cardiac Enzymes: No results for input(s): CKTOTAL, CKMB, CKMBINDEX, TROPONINI in the last 168 hours. BNP (last 3 results) No results for input(s): PROBNP in the last 8760 hours. HbA1C: No results for input(s): HGBA1C in the last 72 hours. CBG: Recent Labs  Lab 11/09/20 0255 11/09/20 0358  GLUCAP 450* 441*   Lipid Profile: No results for input(s): CHOL, HDL, LDLCALC, TRIG, CHOLHDL, LDLDIRECT in the last 72 hours. Thyroid Function Tests: No results for input(s): TSH, T4TOTAL, FREET4, T3FREE, THYROIDAB in the last 72 hours. Anemia Panel: No results for input(s): VITAMINB12, FOLATE, FERRITIN, TIBC, IRON, RETICCTPCT in the last 72 hours. Urine analysis:    Component Value Date/Time  COLORURINE STRAW (A) 11/09/2020 0337   APPEARANCEUR CLEAR 11/09/2020 0337   LABSPEC 1.024 11/09/2020 0337   PHURINE 6.0 11/09/2020 0337   GLUCOSEU >=500 (A) 11/09/2020 0337   HGBUR NEGATIVE 11/09/2020 0337   BILIRUBINUR NEGATIVE 11/09/2020 0337   KETONESUR 80 (A) 11/09/2020 0337   PROTEINUR NEGATIVE 11/09/2020 0337   UROBILINOGEN 0.2 09/18/2019 1339   NITRITE NEGATIVE 11/09/2020 0337   LEUKOCYTESUR NEGATIVE 11/09/2020 0337    Radiological Exams on Admission: No results found.  EKG: Independently reviewed.  Assessment/Plan Principal Problem:   DKA, type 1 (HCC) Active Problems:   Amphetamine-type substance use disorder, severe (HCC)   Polysubstance abuse ([place])   IVDU (intravenous drug user)    1. DKA - 1. DKA pathway 2. IVF: 2.1L bolus in ED then 125 cc/hr LR then 125 cc/hr D5 LR 3. Insulin gtt 4. BMP Q4H 5. Check BHB 6. 2 runs IV K 2. IVDU, amphetamine abuse - 1. SW consult 2. Hopefully doesn't leave AMA (has h/o of this)  DVT prophylaxis: Lovenox Code Status: Full Family Communication: No family in room Disposition Plan: Home after DKA  resolved Consults called: None Admission status: Admit to inpatient  Severity of Illness: The appropriate patient status for this patient is INPATIENT. Inpatient status is judged to be reasonable and necessary in order to provide the required intensity of service to ensure the patient's safety. The patient's presenting symptoms, physical exam findings, and initial radiographic and laboratory data in the context of their chronic comorbidities is felt to place them at high risk for further clinical deterioration. Furthermore, it is not anticipated that the patient will be medically stable for discharge from the hospital within 2 midnights of admission. The following factors support the patient status of inpatient.   IP status for management of DKA.   * I certify that at the point of admission it is my clinical judgment that the patient will require inpatient hospital care spanning beyond 2 midnights from the point of admission due to high intensity of service, high risk for further deterioration and high frequency of surveillance required.*    Myles Mallicoat M. DO [facility]  How to contact the [facility] Psychiatric Attending or Consulting provider [place] or covering provider during after hours [place], for this patient?  1. Check the care team in [facility] and look for a) attending/consulting [facility] provider listed and b) the [facility] team listed 2. Log into [email or web address]  Amion Physician Scheduling and messaging for groups and whole hospitals  On call and physician scheduling software for group practices, residents, hospitalists and other medical providers for call, clinic, rotation and shift schedules. [employer] is a hospital-wide system for scheduling doctors and paging doctors on call. EasyPlot is for scientific plotting and data analysis.  [email or web address]  and use [place]'s universal password to access. If you do not have the password, please contact the hospital operator.  3. Locate the [facility] provider  you are looking for under [facility] and page to a number that you can be directly reached. 4. If you still have difficulty reaching the provider, please page the [facility] (Director on Call) for the Hospitalists listed on amion for assistance.  11/09/2020, 5:59 AM
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
## 2020-11-09 NOTE — ED Triage Notes (Signed)
Pt to ED by POV from home with c/o Hyperglycemia and relapsing today and injecting Meth. Pt states he has been clean for 5 years before today. States he is "Really really high". CBG 450. Arrives A+O.

## 2020-11-09 NOTE — Progress Notes (Addendum)
Patient is admitted this morning  due to DKA from medication noncompliance, he is treated with insulin drip, gap is closing ,beta-hydroxybutyric coming down, plan to transition to sub Q insulin

## 2020-11-09 NOTE — ED Provider Notes (Addendum)
Emergency Medicine Provider Triage Evaluation Note  [name] [name]. , a 20 y.o. male  was evaluated in triage.  Pt complains of hyperglycemia. Pt reports he smells ketones.  He cannot remember the last time he took his insulin.  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 Pt reports he has not been eating or sleeping because of the meth.  Pt reports he is injecting meth.  Denies EtOH usage.    Pt reports he has been binging since his friend committed suicide 3 days ago by cutting his wrist.  Pt found his friend, but was unable to revive him. Denies SI/HI/AVH.   Review of Systems  Positive: Increased thirst, polyuria Negative: Fever, chills.   Physical Exam  BP (!) 164/113   Pulse (!) 128   Temp 98.9 F (37.2 C)   Resp (!) 24   Ht 5\' 10"  (1.778 m)   Wt 105 kg   SpO2 98%   BMI 33.21 kg/m   Gen:   Awake, anxious, trembling Resp:  Normal effort  MSK:   Moves extremities without difficulty   Medical Decision Making  Medically screening exam initiated at 3:34 AM.  Appropriate orders placed.  . was informed that the remainder of the evaluation will be completed by another provider, this initial triage assessment does not replace that evaluation, and the importance of remaining in the ED until their evaluation is complete.  CBG 450 - hx of DKA.  Labs pending.  Hyperglycemia  Substance abuse [facility]      [name] [name], [facility] 11/09/20 11/11/20    [id number], MD 11/09/20 [phone]

## 2020-11-09 NOTE — Discharge Summary (Signed)
Discharge Summary  [name] [name]. SFK:[id number] DOB: [birth year]  PCP: Patient, No Pcp Per (Inactive)  Admit date: 11/09/2020 Discharge date: 11/09/2020     patient is signing out [place]:     Discharge Diagnoses:  Active Hospital Problems   Diagnosis Date Noted  . DKA, type 1 ([place]) 07/16/2017  . IVDU (intravenous drug user) 09/14/2020  . Polysubstance abuse ([place])   . 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    Discharge Condition: stable  Diet recommendation: carb modified  Filed Weights   11/09/20 0306 11/09/20 0900  Weight: 105 kg 92 kg    History of present illness: ( per admitting provider Dr Alcario Drought) [name] [name]. is a 20 y.o. male with medical history significant of DM1, IVDU with methamphetamine.  Pt presents to ED with c/o hyperglycemia for past several days.  He reportes he smells keytones.  Cant remember last time he took insulin or checked BGL.  Also reports methamphetamine abuse.  Took "a lot" of meth today.  Not eating or sleeping due to meth use.  Is injecting meth.  Denies EtOH.  No fevers, chills, headache, neck pain.  Pt binging since his friend committed suicide 3 days ago by cutting his rist.  Pt denies SI/HI.   ED Course: DKA with AG 18.  BGL 460.  BHB of 5.31, 80 keytones in urine.  Hospital Course:  Principal Problem:   DKA, type 1 (HCC) Active Problems:   Amphetamine-type substance use disorder, severe (HCC)   Polysubstance abuse ([place])   IVDU (intravenous drug user)  DKA from medication noncompliance, he is treated with insulin drip, gap is closing ,beta-hydroxybutyric coming down, plan to transition to sub Q insulin  However , he does not want to stay, he states he run out of insulin and diabetes supplies, he does not have a meter, Per chart review , he should has 6 refills of insulin but I went ahead sent  new prescription of insulin and diabetic supplies to [facility]   IVDU, amphetamine abuse   H/o signing out of the hospital AMA repeatedly    Discharge Exam: BP (!) 169/109 Comment: Patient laying on side, will recheck  Pulse 96   Temp 98.2 F (36.8 C) (Oral)   Resp (!) 22   Ht '5\' 10"'  (1.778 m)   Wt 92 kg   SpO2 96%   BMI 29.10 kg/m   General: aaox3   Discharge Instructions You were cared for by a hospitalist during your hospital stay. If you have any questions about your discharge medications or the care you received while you were in the hospital after you are discharged, you can call the unit and asked to speak with the hospitalist on call if the hospitalist that took care of you is not available. Once you are discharged, your primary care physician will handle any further medical issues. Please note that NO REFILLS for any discharge medications will be authorized once you are discharged, as it is imperative that you return to your primary care physician (or establish a relationship with a primary care physician if you do not have one) for your aftercare needs so that they can reassess your need for medications and monitor your lab values.   Allergies as of 11/09/2020      Reactions   Bee Venom Anaphylaxis   Fish Allergy Anaphylaxis   Shellfish Allergy Anaphylaxis   Tea Anaphylaxis  Medication List    STOP taking these medications   albuterol 108 (90 Base) MCG/ACT inhaler Commonly known as: VENTOLIN HFA     TAKE these medications   [facility]ocate Insulin Pen Needles 29G X 12.7MM Misc Generic drug: Insulin Pen Needle For type I diabetes   blood glucose meter kit and supplies Kit Dispense based on patient and insurance preference. Use up to four times daily as directed. (FOR ICD-9 250.00, 250.01).   EPINEPHrine 0.3 mg/0.3 mL Soaj injection Commonly known as: EPI-PEN Inject 0.3 mg into the muscle as directed. Call 911   FREESTYLE TEST STRIPS test strip Generic drug:  glucose blood Use as instructed   insulin glargine 100 UNIT/ML Solostar Pen Commonly known as: LANTUS Inject 30 Units into the skin 2 (two) times daily.   NovoLOG FlexPen 100 UNIT/ML FlexPen Generic drug: insulin aspart Inject 1-15 Units into the skin 3 (three) times daily with meals. 1 unit/g of carbohydrate sliding scale. What changed: additional instructions      Allergies  Allergen Reactions  . Bee Venom Anaphylaxis  . Fish Allergy Anaphylaxis  . Shellfish Allergy Anaphylaxis  . Tea Anaphylaxis      The results of significant diagnostics from this hospitalization (including imaging, microbiology, ancillary and laboratory) are listed below for reference.    Significant Diagnostic Studies: No results found.  Microbiology: Recent Results (from the past 240 hour(s))  Resp Panel by RT-PCR (Flu A&B, Covid) Nasopharyngeal Swab     Status: None   Collection Time: 11/09/20  5:54 AM   Specimen: Nasopharyngeal Swab; Nasopharyngeal(NP) swabs in vial transport medium  Result Value Ref Range Status   SARS Coronavirus 2 by RT PCR NEGATIVE NEGATIVE Final    Comment: (NOTE) SARS-CoV-2 target nucleic acids are NOT DETECTED.  The SARS-CoV-2 RNA is generally detectable in upper respiratory specimens during the acute phase of infection. The lowest concentration of SARS-CoV-2 viral copies this assay can detect is 138 copies/mL. A negative result does not preclude SARS-Cov-2 infection and should not be used as the sole basis for treatment or other patient management decisions. A negative result may occur with  improper specimen collection/handling, submission of specimen other than nasopharyngeal swab, presence of viral mutation(s) within the areas targeted by this assay, and inadequate number of viral copies(<138 copies/mL). A negative result must be combined with clinical observations, patient history, and epidemiological information. The expected result is Negative.  Fact Sheet  for Patients:  EntrepreneurPulse.com.au  Fact Sheet for Healthcare Providers:  IncredibleEmployment.be  This test is no t yet approved or cleared by the [place] FDA and  has been authorized for detection and/or diagnosis of SARS-CoV-2 by FDA under an Emergency Use Authorization (EUA). This EUA will remain  in effect (meaning this test can be used) for the duration of the COVID-19 declaration under Section 564(b)(1) of the Act, 21 U.S.C.section 360bbb-3(b)(1), unless the authorization is terminated  or revoked sooner.       Influenza A by PCR NEGATIVE NEGATIVE Final   Influenza B by PCR NEGATIVE NEGATIVE Final    Comment: (NOTE) The Xpert Xpress SARS-CoV-2/FLU/RSV plus assay is intended as an aid in the diagnosis of influenza from Nasopharyngeal swab specimens and should not be used as a sole basis for treatment. Nasal washings and aspirates are unacceptable for Xpert Xpress SARS-CoV-2/FLU/RSV testing.  Fact Sheet for Patients: EntrepreneurPulse.com.au  Fact Sheet for Healthcare Providers: IncredibleEmployment.be  This test is not yet approved or cleared by the [place] and has been authorized for  detection and/or diagnosis of SARS-CoV-2 by FDA under an Emergency Use Authorization (EUA). This EUA will remain in effect (meaning this test can be used) for the duration of the COVID-19 declaration under Section 564(b)(1) of the Act, 21 U.S.C. section 360bbb-3(b)(1), unless the authorization is terminated or revoked.  Performed at [facility], [place] [address], [place], [place] [address]   MRSA PCR Screening     Status: None   Collection Time: 11/09/20  8:30 AM   Specimen: Nasal Mucosa; Nasopharyngeal  Result Value Ref Range Status   MRSA by PCR NEGATIVE NEGATIVE Final    Comment:        The GeneXpert MRSA Assay (FDA approved for NASAL specimens only), is one component of  a comprehensive MRSA colonization surveillance program. It is not intended to diagnose MRSA infection nor to guide or monitor treatment for MRSA infections. Performed at [facility], [place] [address], [place], [place] [address]      Labs: Basic Metabolic Panel: Recent Labs  Lab 11/09/20 0437 11/09/20 0615 11/09/20 1118 11/09/20 1651  NA 132* 133* 136 135  K 4.5 4.5 3.3* 3.5  CL 96* 98 101 100  CO2 18* 19* 25 25  GLUCOSE 460* 355* 148* 192*  BUN '10 7 6 ' <5*  CREATININE 0.90 0.70 0.63 0.61  CALCIUM 9.2 8.8* 8.6* 9.1   Liver Function Tests: No results for input(s): AST, ALT, ALKPHOS, BILITOT, PROT, ALBUMIN in the last 168 hours. No results for input(s): LIPASE, AMYLASE in the last 168 hours. No results for input(s): AMMONIA in the last 168 hours. CBC: Recent Labs  Lab 11/09/20 0437  WBC 7.9  NEUTROABS 5.9  HGB 15.3  HCT 45.7  MCV 84.2  PLT 276   Cardiac Enzymes: No results for input(s): CKTOTAL, CKMB, CKMBINDEX, TROPONINI in the last 168 hours. BNP: BNP (last 3 results) No results for input(s): BNP in the last 8760 hours.  ProBNP (last 3 results) No results for input(s): PROBNP in the last 8760 hours.  CBG: Recent Labs  Lab 11/09/20 1253 11/09/20 1351 11/09/20 1457 11/09/20 1613 11/09/20 1823  GLUCAP 165* 159* 145* 175* 162*       Signed:  Florencia Reasons MD, PhD, FACP  [facility] 11/09/2020, 7:06 PM

## 2020-11-10 ENCOUNTER — Emergency Department (HOSPITAL_COMMUNITY): Payer: Medicaid Other

## 2020-11-10 ENCOUNTER — Encounter (HOSPITAL_COMMUNITY): Payer: Self-pay | Admitting: Emergency Medicine

## 2020-11-10 ENCOUNTER — Inpatient Hospital Stay (HOSPITAL_COMMUNITY)
Admission: EM | Admit: 2020-11-10 | Discharge: 2020-11-12 | DRG: 639 | Disposition: A | Discharge status: Home / Self Care | Payer: Medicaid Other | Attending: Family Medicine | Admitting: Family Medicine

## 2020-11-10 ENCOUNTER — Other Ambulatory Visit: Payer: Self-pay

## 2020-11-10 DIAGNOSIS — E1110 Type 2 diabetes mellitus with ketoacidosis without coma: Secondary | ICD-10-CM | POA: Diagnosis present

## 2020-11-10 DIAGNOSIS — Z825 Family history of asthma and other chronic lower respiratory diseases: Secondary | ICD-10-CM

## 2020-11-10 DIAGNOSIS — Z91030 Bee allergy status: Secondary | ICD-10-CM | POA: Diagnosis not present

## 2020-11-10 DIAGNOSIS — Z794 Long term (current) use of insulin: Secondary | ICD-10-CM

## 2020-11-10 DIAGNOSIS — F1990 Other psychoactive substance use, unspecified, uncomplicated: Secondary | ICD-10-CM

## 2020-11-10 DIAGNOSIS — G43909 Migraine, unspecified, not intractable, without status migrainosus: Secondary | ICD-10-CM | POA: Diagnosis present

## 2020-11-10 DIAGNOSIS — F32A Depression, unspecified: Secondary | ICD-10-CM | POA: Diagnosis present

## 2020-11-10 DIAGNOSIS — F419 Anxiety disorder, unspecified: Secondary | ICD-10-CM | POA: Diagnosis present

## 2020-11-10 DIAGNOSIS — Z8249 Family history of ischemic heart disease and other diseases of the circulatory system: Secondary | ICD-10-CM

## 2020-11-10 DIAGNOSIS — F250 Schizoaffective disorder, bipolar type: Secondary | ICD-10-CM | POA: Diagnosis present

## 2020-11-10 DIAGNOSIS — Z83438 Family history of other disorder of lipoprotein metabolism and other lipidemia: Secondary | ICD-10-CM | POA: Diagnosis not present

## 2020-11-10 DIAGNOSIS — J45909 Unspecified asthma, uncomplicated: Secondary | ICD-10-CM | POA: Diagnosis present

## 2020-11-10 DIAGNOSIS — F17210 Nicotine dependence, cigarettes, uncomplicated: Secondary | ICD-10-CM | POA: Diagnosis present

## 2020-11-10 DIAGNOSIS — Z9119 Patient's noncompliance with other medical treatment and regimen: Secondary | ICD-10-CM

## 2020-11-10 DIAGNOSIS — Z9109 Other allergy status, other than to drugs and biological substances: Secondary | ICD-10-CM | POA: Diagnosis not present

## 2020-11-10 DIAGNOSIS — E1010 Type 1 diabetes mellitus with ketoacidosis without coma: Principal | ICD-10-CM | POA: Diagnosis present

## 2020-11-10 DIAGNOSIS — Z91013 Allergy to seafood: Secondary | ICD-10-CM

## 2020-11-10 DIAGNOSIS — Z20822 Contact with and (suspected) exposure to covid-19: Secondary | ICD-10-CM | POA: Diagnosis present

## 2020-11-10 DIAGNOSIS — Z833 Family history of diabetes mellitus: Secondary | ICD-10-CM

## 2020-11-10 DIAGNOSIS — F1510 Other stimulant abuse, uncomplicated: Secondary | ICD-10-CM | POA: Diagnosis present

## 2020-11-10 HISTORY — DX: Type 2 diabetes mellitus with ketoacidosis without coma: E11.10

## 2020-11-10 LAB — BASIC METABOLIC PANEL
Anion gap: 24 — ABNORMAL HIGH (ref 5–15)
Anion gap: 17 — ABNORMAL HIGH (ref 5–15)
BUN: 15 mg/dL (ref 6–20)
BUN: 15 mg/dL (ref 6–20)
BUN: 13 mg/dL (ref 6–20)
BUN: 10 mg/dL (ref 6–20)
CO2: <7 mmol/L — ABNORMAL LOW (ref 22–32)
CO2: <7 mmol/L — ABNORMAL LOW (ref 22–32)
CO2: 9 mmol/L — ABNORMAL LOW (ref 22–32)
CO2: 10 mmol/L — ABNORMAL LOW (ref 22–32)
Calcium: 9.2 mg/dL (ref 8.9–10.3)
Calcium: 8.5 mg/dL — ABNORMAL LOW (ref 8.9–10.3)
Calcium: 8.7 mg/dL — ABNORMAL LOW (ref 8.9–10.3)
Calcium: 8.3 mg/dL — ABNORMAL LOW (ref 8.9–10.3)
Chloride: 97 mmol/L — ABNORMAL LOW (ref 98–111)
Chloride: 104 mmol/L (ref 98–111)
Chloride: 110 mmol/L (ref 98–111)
Chloride: 115 mmol/L — ABNORMAL HIGH (ref 98–111)
Creatinine, Ser: 1.14 mg/dL (ref 0.61–1.24)
Creatinine, Ser: 1.4 mg/dL — ABNORMAL HIGH (ref 0.61–1.24)
Creatinine, Ser: 1.1 mg/dL (ref 0.61–1.24)
Creatinine, Ser: 0.9 mg/dL (ref 0.61–1.24)
GFR, Estimated: >60 mL/min (ref >60)
GFR, Estimated: >60 mL/min (ref >60)
GFR, Estimated: >60 mL/min (ref >60)
GFR, Estimated: >60 mL/min (ref >60)
Glucose, Bld: 589 mg/dL (ref 70–99)
Glucose, Bld: 623 mg/dL (ref 70–99)
Glucose, Bld: 205 mg/dL — ABNORMAL HIGH (ref 70–99)
Glucose, Bld: 201 mg/dL — ABNORMAL HIGH (ref 70–99)
Potassium: 4.8 mmol/L (ref 3.5–5.1)
Potassium: 6.1 mmol/L — ABNORMAL HIGH (ref 3.5–5.1)
Potassium: 4.3 mmol/L (ref 3.5–5.1)
Potassium: 3.8 mmol/L (ref 3.5–5.1)
Sodium: 136 mmol/L (ref 135–145)
Sodium: 138 mmol/L (ref 135–145)
Sodium: 143 mmol/L (ref 135–145)
Sodium: 142 mmol/L (ref 135–145)

## 2020-11-10 LAB — URINALYSIS, ROUTINE W REFLEX MICROSCOPIC
Bacteria, UA: NONE SEEN
Bilirubin Urine: NEGATIVE
Glucose, UA: >=500 mg/dL — AB
Hgb urine dipstick: NEGATIVE
Ketones, ur: 80 mg/dL — AB
Leukocytes,Ua: NEGATIVE
Nitrite: NEGATIVE
Protein, ur: NEGATIVE mg/dL
Specific Gravity, Urine: 1.015 (ref 1.005–1.030)
pH: 5 (ref 5.0–8.0)

## 2020-11-10 LAB — CBC
HCT: 52.7 % — ABNORMAL HIGH (ref 39.0–52.0)
Hemoglobin: 17 g/dL (ref 13.0–17.0)
MCH: 28.3 pg (ref 26.0–34.0)
MCHC: 32.3 g/dL (ref 30.0–36.0)
MCV: 87.7 fL (ref 80.0–100.0)
Platelets: 426 10*3/uL — ABNORMAL HIGH (ref 150–400)
RBC: 6.01 MIL/uL — ABNORMAL HIGH (ref 4.22–5.81)
RDW: 12.3 % (ref 11.5–15.5)
WBC: 19.2 10*3/uL — ABNORMAL HIGH (ref 4.0–10.5)
nRBC: 0 % (ref 0.0–0.2)

## 2020-11-10 LAB — GLUCOSE, CAPILLARY
Glucose-Capillary: 159 mg/dL — ABNORMAL HIGH (ref 70–99)
Glucose-Capillary: 166 mg/dL — ABNORMAL HIGH (ref 70–99)
Glucose-Capillary: 174 mg/dL — ABNORMAL HIGH (ref 70–99)
Glucose-Capillary: 177 mg/dL — ABNORMAL HIGH (ref 70–99)
Glucose-Capillary: 185 mg/dL — ABNORMAL HIGH (ref 70–99)
Glucose-Capillary: 294 mg/dL — ABNORMAL HIGH (ref 70–99)
Glucose-Capillary: 412 mg/dL — ABNORMAL HIGH (ref 70–99)
Glucose-Capillary: 416 mg/dL — ABNORMAL HIGH (ref 70–99)

## 2020-11-10 LAB — BLOOD GAS, VENOUS
Acid-base deficit: 26.7 mmol/L — ABNORMAL HIGH (ref 0.0–2.0)
Bicarbonate: 5.3 mmol/L — ABNORMAL LOW (ref 20.0–28.0)
FIO2: 21
O2 Saturation: 60.2 %
Patient temperature: 98.6
pCO2, Ven: 21.1 mmHg — ABNORMAL LOW (ref 44.0–60.0)
pH, Ven: 7.026 — CL (ref 7.250–7.430)
pO2, Ven: 41.7 mmHg (ref 32.0–45.0)

## 2020-11-10 LAB — RESP PANEL BY RT-PCR (FLU A&B, COVID) ARPGX2
Influenza A by PCR: NEGATIVE
Influenza B by PCR: NEGATIVE
SARS Coronavirus 2 by RT PCR: NEGATIVE

## 2020-11-10 LAB — HEPATIC FUNCTION PANEL
ALT: 46 U/L — ABNORMAL HIGH (ref 0–44)
AST: 54 U/L — ABNORMAL HIGH (ref 15–41)
Albumin: 4.8 g/dL (ref 3.5–5.0)
Alkaline Phosphatase: 124 U/L (ref 38–126)
Bilirubin, Direct: <0.1 mg/dL (ref 0.0–0.2)
Total Bilirubin: 2.4 mg/dL — ABNORMAL HIGH (ref 0.3–1.2)
Total Protein: 9.1 g/dL — ABNORMAL HIGH (ref 6.5–8.1)

## 2020-11-10 LAB — BETA-HYDROXYBUTYRIC ACID
Beta-Hydroxybutyric Acid: >8 mmol/L — ABNORMAL HIGH (ref 0.05–0.27)
Beta-Hydroxybutyric Acid: >8 mmol/L — ABNORMAL HIGH (ref 0.05–0.27)

## 2020-11-10 LAB — CBG MONITORING, ED
Glucose-Capillary: 563 mg/dL (ref 70–99)
Glucose-Capillary: 575 mg/dL (ref 70–99)
Glucose-Capillary: 513 mg/dL (ref 70–99)
Glucose-Capillary: >600 mg/dL (ref 70–99)
Glucose-Capillary: 455 mg/dL — ABNORMAL HIGH (ref 70–99)

## 2020-11-10 LAB — LIPASE, BLOOD: Lipase: 17 U/L (ref 11–51)

## 2020-11-10 LAB — LACTIC ACID, PLASMA: Lactic Acid, Venous: 2.8 mmol/L (ref 0.5–1.9)

## 2020-11-10 LAB — TROPONIN I (HIGH SENSITIVITY): Troponin I (High Sensitivity): 4 ng/L (ref <18)

## 2020-11-10 MED ORDER — INSULIN REGULAR(HUMAN) IN NACL 100-0.9 UT/100ML-% IV SOLN
INTRAVENOUS | Status: DC
  Administered 2020-11-10: 10.5 [IU]/h via INTRAVENOUS
  Administered 2020-11-11: 3.4 [IU]/h via INTRAVENOUS
  Filled 2020-11-10: qty 100

## 2020-11-10 MED ORDER — ORAL CARE MOUTH RINSE
15.0000 mL | Freq: Two times a day (BID) | OROMUCOSAL | Status: DC
  Administered 2020-11-10 – 2020-11-12 (×4): 15 mL via OROMUCOSAL

## 2020-11-10 MED ORDER — DEXTROSE IN LACTATED RINGERS 5 % IV SOLN: INTRAVENOUS | Status: DC

## 2020-11-10 MED ORDER — POLYETHYLENE GLYCOL 3350 17 G PO PACK: 17.0000 g | PACK | Freq: Every day | ORAL | Status: DC | PRN

## 2020-11-10 MED ORDER — SODIUM CHLORIDE 0.9 % IV BOLUS
1000.0000 mL | Freq: Once | INTRAVENOUS | Status: AC
  Administered 2020-11-10: 1000 mL via INTRAVENOUS

## 2020-11-10 MED ORDER — LACTATED RINGERS IV SOLN: INTRAVENOUS | Status: DC

## 2020-11-10 MED ORDER — INSULIN REGULAR(HUMAN) IN NACL 100-0.9 UT/100ML-% IV SOLN
INTRAVENOUS | Status: DC
  Administered 2020-11-10: 13 [IU]/h via INTRAVENOUS
  Filled 2020-11-10: qty 100

## 2020-11-10 MED ORDER — DEXTROSE 50 % IV SOLN: 0.0000 mL | INTRAVENOUS | Status: DC | PRN

## 2020-11-10 MED ORDER — POTASSIUM CHLORIDE 10 MEQ/100ML IV SOLN
10.0000 meq | INTRAVENOUS | Status: DC
  Administered 2020-11-10: 10 meq via INTRAVENOUS
  Filled 2020-11-10: qty 100

## 2020-11-10 MED ORDER — ACETAMINOPHEN 325 MG PO TABS
650.0000 mg | ORAL_TABLET | Freq: Four times a day (QID) | ORAL | Status: DC | PRN
  Administered 2020-11-11: 650 mg via ORAL
  Filled 2020-11-10: qty 2

## 2020-11-10 MED ORDER — HEPARIN SODIUM (PORCINE) 5000 UNIT/ML IJ SOLN
5000.0000 [IU] | Freq: Three times a day (TID) | INTRAMUSCULAR | Status: DC
  Administered 2020-11-10 – 2020-11-11 (×5): 5000 [IU] via SUBCUTANEOUS
  Filled 2020-11-10 (×6): qty 1

## 2020-11-10 MED ORDER — DOCUSATE SODIUM 100 MG PO CAPS: 100.0000 mg | ORAL_CAPSULE | Freq: Two times a day (BID) | ORAL | Status: DC | PRN

## 2020-11-10 MED ORDER — LACTATED RINGERS IV BOLUS
20.0000 mL/kg | Freq: Once | INTRAVENOUS | Status: AC
  Administered 2020-11-10: 1840 mL via INTRAVENOUS

## 2020-11-10 MED ORDER — SODIUM BICARBONATE 8.4 % IV SOLN
100.0000 meq | Freq: Once | INTRAVENOUS | Status: AC
  Administered 2020-11-10: 100 meq via INTRAVENOUS
  Filled 2020-11-10: qty 50

## 2020-11-10 MED ORDER — LABETALOL HCL 5 MG/ML IV SOLN: 10.0000 mg | INTRAVENOUS | Status: DC | PRN

## 2020-11-10 MED ORDER — LABETALOL HCL 5 MG/ML IV SOLN
10.0000 mg | INTRAVENOUS | Status: DC | PRN
  Administered 2020-11-10 (×2): 10 mg via INTRAVENOUS
  Filled 2020-11-10 (×2): qty 4

## 2020-11-10 MED ORDER — ONDANSETRON HCL 4 MG/2ML IJ SOLN: 4.0000 mg | Freq: Four times a day (QID) | INTRAMUSCULAR | Status: DC | PRN

## 2020-11-10 MED ORDER — LACTATED RINGERS IV BOLUS
1000.0000 mL | Freq: Once | INTRAVENOUS | Status: AC
  Administered 2020-11-10: 1000 mL via INTRAVENOUS

## 2020-11-10 MED ORDER — FENTANYL CITRATE (PF) 100 MCG/2ML IJ SOLN
50.0000 ug | Freq: Once | INTRAMUSCULAR | Status: AC
Start: 2020-11-10 — End: 2020-11-10
  Administered 2020-11-10: 50 ug via INTRAVENOUS
  Filled 2020-11-10: qty 2

## 2020-11-10 MED ORDER — CHLORHEXIDINE GLUCONATE CLOTH 2 % EX PADS
6.0000 | MEDICATED_PAD | Freq: Every day | CUTANEOUS | Status: DC
  Administered 2020-11-10 – 2020-11-11 (×2): 6 via TOPICAL

## 2020-11-10 NOTE — ED Triage Notes (Signed)
Pt states he feels like he is in dka and having and asthma attack. Pain all over. Alert and oriented.

## 2020-11-10 NOTE — H&P (Signed)
NAME:  [name] Marrs., MRN:  [id number], DOB:  09-Dec-2000, LOS: 0 ADMISSION DATE:  11/10/2020, CONSULTATION DATE:  11/10/20 REFERRING MD:  Rex Kras, CHIEF COMPLAINT:  SOB   History of Present Illness:  Poorly controlled DM1, polysubstance abuse presenting day after leaving AMA for DKA here with worse DKA.  Too tachypneic to present much history.  Pertinent  Medical History  DM1 IVDU Amphetamine abuse  Significant Hospital Events: Including procedures, antibiotic start and stop dates in addition to other pertinent events   . 5/13 admitted to [facility], left AMA . 5/14 readmitted, [facility] wants PCCM to admit  Interim History / Subjective:  N/a  Objective   Blood pressure (!) 169/86, pulse 84, resp. rate (!) 30, SpO2 100 %.        Intake/Output Summary (Last 24 hours) at 11/10/2020 1501 Last data filed at 11/10/2020 1410 Gross per 24 hour  Intake 1000 ml  Output --  Net 1000 ml   There were no vitals filed for this visit.  Examination: General: ill appearing man lying in bed HENT: MM dry, trachea midline Lungs: clear, tachypneic, + accessory muscle use Cardiovascular: tachycardic, ext warm Abdomen: diffuse TTP Extremities: no edema, no arthritic symptoms Neuro: moves all 4 ext to command GU: clear urine   Labs/imaging that I havepersonally reviewed  (right click and "Reselect all SmartList Selections" daily)  Ph 7 K okay Corrected sdoium at bit high CBC hemoconcentrated High AG Amphetamines + 5/13  Resolved Hospital Problem list   n/a  Assessment & Plan:  Recurrent DKA due to noncompliance and drug abuse Poor social situation Hypovolemic AKI No signs of infection - DKA protocol insulin with usual parameters and aggressive IVF - Will give a couple amps bicarb understanding this delays gap closure should ease WOB - Can have tylenol for pain for now - Check lipase - Hold abx for now - [facility] consult  Best practice (right click and "Reselect all SmartList Selections"  daily)  Diet:  NPO Pain/Anxiety/Delirium protocol (if indicated): No VAP protocol (if indicated): Yes DVT prophylaxis: Subcutaneous Heparin GI prophylaxis: N/A Glucose control:  Insulin gtt Central venous access:  N/A Arterial line:  N/A Foley:  N/A Mobility:  bed rest  PT consulted: N/A Last date of multidisciplinary goals of care discussion [n/a] Code Status:  full code Disposition: ICU, appreciate [facility] taking over care starting 5/15  Labs   CBC: Recent Labs  Lab 11/09/20 0437 11/10/20 1248  WBC 7.9 19.2*  NEUTROABS 5.9  --   HGB 15.3 17.0  HCT 45.7 52.7*  MCV 84.2 87.7  PLT 276 426*    Basic Metabolic Panel: Recent Labs  Lab 11/09/20 0437 11/09/20 0615 11/09/20 1118 11/09/20 1651 11/10/20 1248  NA 132* 133* 136 135 136  K 4.5 4.5 3.3* 3.5 4.8  CL 96* 98 101 100 97*  CO2 18* 19* 25 25 <7*  GLUCOSE 460* 355* 148* 192* 589*  BUN '10 7 6 ' <5* 15  CREATININE 0.90 0.70 0.63 0.61 1.14  CALCIUM 9.2 8.8* 8.6* 9.1 9.2   GFR: Estimated Creatinine Clearance: 116.9 mL/min (by C-G formula based on SCr of 1.14 mg/dL). Recent Labs  Lab 11/09/20 0437 11/10/20 1248 11/10/20 1252  WBC 7.9 19.2*  --   LATICACIDVEN  --   --  2.8*    Liver Function Tests: Recent Labs  Lab 11/10/20 1248  AST 54*  ALT 46*  ALKPHOS 124  BILITOT 2.4*  PROT 9.1*  ALBUMIN 4.8   No results for  input(s): LIPASE, AMYLASE in the last 168 hours. No results for input(s): AMMONIA in the last 168 hours.  ABG    Component Value Date/Time   PHART 7.311 (L) 09/24/2018 0003   PCO2ART 28.4 (L) 09/24/2018 0003   PO2ART 103.0 09/24/2018 0003   HCO3 5.3 (L) 11/10/2020 1252   TCO2 7 (L) 09/14/2020 0949   ACIDBASEDEF 26.7 (H) 11/10/2020 1252   O2SAT 60.2 11/10/2020 1252     Coagulation Profile: No results for input(s): INR, PROTIME in the last 168 hours.  Cardiac Enzymes: No results for input(s): CKTOTAL, CKMB, CKMBINDEX, TROPONINI in the last 168 hours.  HbA1C: Hgb A1c MFr Bld   Date/Time Value Ref Range Status  11/09/2020 06:15 AM 10.5 (H) 4.8 - 5.6 % Final    Comment:    (NOTE) Pre diabetes:          5.7%-6.4%  Diabetes:              >6.4%  Glycemic control for   <7.0% adults with diabetes   05/17/2020 06:03 PM 11.7 (H) 4.8 - 5.6 % Final    Comment:    (NOTE) Pre diabetes:          5.7%-6.4%  Diabetes:              >6.4%  Glycemic control for   <7.0% adults with diabetes     CBG: Recent Labs  Lab 11/09/20 1613 11/09/20 1823 11/10/20 1243 11/10/20 1255 11/10/20 1432  GLUCAP 175* 162* 575* 563* >600*    Review of Systems:   Limited by WOB: has generalized body pains, worse in stomach, +N/V  Past Medical History:  He,  has a past medical history of ADD (attention deficit disorder), Allergy, Anxiety, Asthma, Depression, Epileptic seizure ([place]), Headache(784.0), Heart murmur, Homeless, Migraine, ODD (oppositional defiant disorder), Sickle cell trait ([place]), Type I diabetes mellitus ([place]), and Vision abnormalities.   Surgical History:   Past Surgical History:  Procedure Laterality Date  . CIRCUMCISION  2000  . FINGER SURGERY Left 2001   "crushed pinky; had to repair it" (06/17/2013)     Social History:   reports that he has been smoking cigarettes. He has been smoking about 0.50 packs per day. He has never used smokeless tobacco. He reports current drug use. Frequency: 7.00 times per week. Drugs: Marijuana and Methamphetamines. He reports that he does not drink alcohol.   Family History:  His family history includes Asthma in his mother; COPD in his mother; Diabetes in his maternal grandmother; Heart disease in his maternal grandfather and maternal grandmother; Hyperlipidemia in his paternal grandfather and paternal grandmother; Hypertension in his maternal grandmother; Mental illness in his maternal grandmother; Migraines in his sister; Other in his mother.   Allergies Allergies  Allergen Reactions  . Bee Venom Anaphylaxis  . Fish  Allergy Anaphylaxis  . Shellfish Allergy Anaphylaxis  . Tea Anaphylaxis     Home Medications  Prior to Admission medications   Medication Sig Start Date End Date Taking? Authorizing Provider  blood glucose meter kit and supplies KIT Dispense based on patient and insurance preference. Use up to four times daily as directed. (FOR ICD-9 250.00, 250.01). 11/09/20   Florencia Reasons, MD  EPINEPHrine 0.3 mg/0.3 mL IJ SOAJ injection Inject 0.3 mg into the muscle as directed. Call 911 05/02/20   [provider]  glucose blood (FREESTYLE TEST STRIPS) test strip Use as instructed 11/09/20   Florencia Reasons, MD  insulin aspart (NOVOLOG FLEXPEN) 100 UNIT/ML FlexPen Inject 1-15  Units into the skin 3 (three) times daily with meals. 1 unit/g of carbohydrate sliding scale. 11/09/20   Florencia Reasons, MD  insulin glargine (LANTUS) 100 UNIT/ML Solostar Pen Inject 30 Units into the skin 2 (two) times daily. 11/09/20   Florencia Reasons, MD  Insulin Pen Needle (ADVOCATE INSULIN PEN NEEDLES) 29G X 12.7MM MISC For type I diabetes 11/09/20   Florencia Reasons, MD

## 2020-11-10 NOTE — Progress Notes (Signed)
Unable to do thorough skin assessment. Patient would not let this RN, along with RN Sarah, assess his perineal area or backside. He states that its "against his religion." He at first was unwilling to remove his pants. He finally agreed to remove them but only if we kept him covered. He also refused his sacral foam dressing.

## 2020-11-10 NOTE — ED Notes (Signed)
Lactate of 2.8 per lab, Little MD informed at this time
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
## 2020-11-10 NOTE — ED Notes (Signed)
PT GIVEN URINAL WILL GIVE URINE SAMPLE WHEN ABLE.

## 2020-11-10 NOTE — ED Notes (Signed)
Patient's blood glucose increased to 623 with repeat BMP per lab, Katrinka Blazing MD messaged at this time

## 2020-11-10 NOTE — ED Notes (Signed)
Provider at bedside

## 2020-11-11 DIAGNOSIS — E1010 Type 1 diabetes mellitus with ketoacidosis without coma: Secondary | ICD-10-CM | POA: Diagnosis not present

## 2020-11-11 LAB — CBC
HCT: 40.9 % (ref 39.0–52.0)
Hemoglobin: 13.4 g/dL (ref 13.0–17.0)
MCH: 28.3 pg (ref 26.0–34.0)
MCHC: 32.8 g/dL (ref 30.0–36.0)
MCV: 86.3 fL (ref 80.0–100.0)
Platelets: 292 10*3/uL (ref 150–400)
RBC: 4.74 MIL/uL (ref 4.22–5.81)
RDW: 12.7 % (ref 11.5–15.5)
WBC: 12.4 10*3/uL — ABNORMAL HIGH (ref 4.0–10.5)
nRBC: 0 % (ref 0.0–0.2)

## 2020-11-11 LAB — BASIC METABOLIC PANEL
Anion gap: 13 (ref 5–15)
Anion gap: 9 (ref 5–15)
BUN: 8 mg/dL (ref 6–20)
BUN: 8 mg/dL (ref 6–20)
CO2: 14 mmol/L — ABNORMAL LOW (ref 22–32)
CO2: 18 mmol/L — ABNORMAL LOW (ref 22–32)
Calcium: 8.1 mg/dL — ABNORMAL LOW (ref 8.9–10.3)
Calcium: 8.3 mg/dL — ABNORMAL LOW (ref 8.9–10.3)
Chloride: 111 mmol/L (ref 98–111)
Chloride: 113 mmol/L — ABNORMAL HIGH (ref 98–111)
Creatinine, Ser: 0.78 mg/dL (ref 0.61–1.24)
Creatinine, Ser: 0.74 mg/dL (ref 0.61–1.24)
GFR, Estimated: >60 mL/min (ref >60)
GFR, Estimated: >60 mL/min (ref >60)
Glucose, Bld: 167 mg/dL — ABNORMAL HIGH (ref 70–99)
Glucose, Bld: 164 mg/dL — ABNORMAL HIGH (ref 70–99)
Potassium: 3.2 mmol/L — ABNORMAL LOW (ref 3.5–5.1)
Potassium: 3.1 mmol/L — ABNORMAL LOW (ref 3.5–5.1)
Sodium: 138 mmol/L (ref 135–145)
Sodium: 140 mmol/L (ref 135–145)

## 2020-11-11 LAB — GLUCOSE, CAPILLARY
Glucose-Capillary: 203 mg/dL — ABNORMAL HIGH (ref 70–99)
Glucose-Capillary: 181 mg/dL — ABNORMAL HIGH (ref 70–99)
Glucose-Capillary: 159 mg/dL — ABNORMAL HIGH (ref 70–99)
Glucose-Capillary: 196 mg/dL — ABNORMAL HIGH (ref 70–99)
Glucose-Capillary: 174 mg/dL — ABNORMAL HIGH (ref 70–99)
Glucose-Capillary: 181 mg/dL — ABNORMAL HIGH (ref 70–99)
Glucose-Capillary: 172 mg/dL — ABNORMAL HIGH (ref 70–99)
Glucose-Capillary: 148 mg/dL — ABNORMAL HIGH (ref 70–99)
Glucose-Capillary: 163 mg/dL — ABNORMAL HIGH (ref 70–99)
Glucose-Capillary: 217 mg/dL — ABNORMAL HIGH (ref 70–99)
Glucose-Capillary: 209 mg/dL — ABNORMAL HIGH (ref 70–99)
Glucose-Capillary: 191 mg/dL — ABNORMAL HIGH (ref 70–99)
Glucose-Capillary: 428 mg/dL — ABNORMAL HIGH (ref 70–99)
Glucose-Capillary: 389 mg/dL — ABNORMAL HIGH (ref 70–99)
Glucose-Capillary: 366 mg/dL — ABNORMAL HIGH (ref 70–99)

## 2020-11-11 LAB — PHOSPHORUS: Phosphorus: 2.7 mg/dL (ref 2.5–4.6)

## 2020-11-11 LAB — URINE CULTURE: Culture: NO GROWTH

## 2020-11-11 LAB — MAGNESIUM: Magnesium: 2 mg/dL (ref 1.7–2.4)

## 2020-11-11 LAB — TROPONIN I (HIGH SENSITIVITY): Troponin I (High Sensitivity): 2 ng/L (ref <18)

## 2020-11-11 LAB — BETA-HYDROXYBUTYRIC ACID
Beta-Hydroxybutyric Acid: 6.24 mmol/L — ABNORMAL HIGH (ref 0.05–0.27)
Beta-Hydroxybutyric Acid: 3.64 mmol/L — ABNORMAL HIGH (ref 0.05–0.27)

## 2020-11-11 LAB — GLUCOSE, RANDOM: Glucose, Bld: 435 mg/dL — ABNORMAL HIGH (ref 70–99)

## 2020-11-11 MED ORDER — INSULIN GLARGINE 100 UNIT/ML ~~LOC~~ SOLN
10.0000 [IU] | Freq: Once | SUBCUTANEOUS | Status: AC
  Administered 2020-11-11: 10 [IU] via SUBCUTANEOUS
  Filled 2020-11-11: qty 0.1

## 2020-11-11 MED ORDER — POTASSIUM CHLORIDE CRYS ER 20 MEQ PO TBCR
40.0000 meq | EXTENDED_RELEASE_TABLET | Freq: Once | ORAL | Status: AC
  Administered 2020-11-11: 40 meq via ORAL
  Filled 2020-11-11: qty 2

## 2020-11-11 MED ORDER — POTASSIUM CHLORIDE 20 MEQ PO PACK
40.0000 meq | PACK | Freq: Once | ORAL | Status: DC
  Filled 2020-11-11: qty 2

## 2020-11-11 MED ORDER — INSULIN ASPART 100 UNIT/ML IJ SOLN
0.0000 [IU] | Freq: Three times a day (TID) | INTRAMUSCULAR | Status: DC
  Administered 2020-11-11 – 2020-11-12 (×2): 15 [IU] via SUBCUTANEOUS
  Administered 2020-11-12: 5 [IU] via SUBCUTANEOUS

## 2020-11-11 MED ORDER — INSULIN GLARGINE 100 UNIT/ML ~~LOC~~ SOLN
30.0000 [IU] | Freq: Two times a day (BID) | SUBCUTANEOUS | Status: DC
  Administered 2020-11-11 – 2020-11-12 (×2): 30 [IU] via SUBCUTANEOUS
  Filled 2020-11-11 (×2): qty 0.3

## 2020-11-11 MED ORDER — INSULIN GLARGINE 100 UNIT/ML ~~LOC~~ SOLN
10.0000 [IU] | Freq: Two times a day (BID) | SUBCUTANEOUS | Status: DC
  Administered 2020-11-11: 10 [IU] via SUBCUTANEOUS
  Filled 2020-11-11: qty 0.1

## 2020-11-11 NOTE — Progress Notes (Signed)
Transfer to telemetry order canceled per Dr. Lucianne Muss

## 2020-11-11 NOTE — Progress Notes (Signed)
Received report from Selena Batten, RN in ICU.

## 2020-11-11 NOTE — Progress Notes (Signed)
PROGRESS NOTE    [name] [name].  OMB:[id number] DOB: 2001/01/29 DOA: 11/10/2020 PCP: Patient, No Pcp Per (Inactive)   Brief Narrative: This 20 years old male with a type 1 diabetes which has been poorly controlled with noncompliance,  multiple hospitalizations , polysubstance abuse presented in the ED one day after leaving AMA for DKA from [facility] long hospital with worsening DKA.  Patient was very short of breath and unable to provide history.  Urine drug screen is positive for amphetamine.  Patient is admitted in the ICU for high anion gap Metabolic acidosis due to DKA.   Assessment & Plan:   Active Problems:   DKA (diabetic ketoacidosis) (HCC)  High anion gap metabolic acidosis due to DKA > Resolved. Recurrent DKA due to noncompliance and drug abuse. Patient was recently admitted for diabetic ketoacidosis. She has signed AGAINST MEDICAL ADVICE. She presented again in the ED with worsening DKA. Anion gap 24, BHBA > 8.0 Started on DKA protocol with insulin and aggressive IV fluids. Anion gap closed.  Patient is transitioned on Lantus and sliding scale. Continue Lantus 30 units every 12 hours and moderate scale sliding scale. Continue IV hydration.,  Started on carb modified diet. Lipase negative.  Hold antibiotics.  Substance abuse: Urine drug screen amphetamine positive Patient needs counseling, social worker consult.  DVT prophylaxis: Lovenox Code Status: Full code. Family Communication: Wife was at bed side. Disposition Plan:  Status is: Inpatient  Remains inpatient appropriate because:Inpatient level of care appropriate due to severity of illness   Dispo: The patient is from: Home              Anticipated d/c is to: Home              Patient currently is not medically stable to d/c.   Difficult to place patient No    Consultants:   PCCM  Procedures:  Antimicrobials:   Anti-infectives (From admission, onward)   None     Subjective: Patient was seen  and examined at bedside.  Overnight events noted.  Patient reports feeling hungry and wants to eat.  Blood sugars been improving.  Objective: Vitals:   11/11/20 0900 11/11/20 1000 11/11/20 1200 11/11/20 1248  BP: (!) 153/99 128/75 (!) 122/50   Pulse: 86 89 (!) 106   Resp: 18 (!) 24 (!) 21   Temp: 98.3 F (36.8 C)   99.6 F (37.6 C)  TempSrc: Oral   Axillary  SpO2: 100% 100% 97%   Weight:        Intake/Output Summary (Last 24 hours) at 11/11/2020 1303 Last data filed at 11/11/2020 0305 Gross per 24 hour  Intake 5034.17 ml  Output 3900 ml  Net 1134.17 ml   Filed Weights   11/11/20 0112  Weight: 92 kg    Examination:  General exam: Appears calm and comfortable, not in any acute distress. Respiratory system: Clear to auscultation. Respiratory effort normal. Cardiovascular system: S1 & S2 heard, RRR. No JVD, murmurs, rubs, gallops or clicks. No pedal edema. Gastrointestinal system: Abdomen is nondistended, soft and nontender. No organomegaly or masses felt. Normal bowel sounds heard. Central nervous system: Alert and oriented. No focal neurological deficits. Extremities: Symmetric 5 x 5 power.  No edema, no cyanosis, no clubbing. Skin: No rashes, lesions or ulcers Psychiatry: Judgement and insight appear normal. Mood & affect appropriate.     Data Reviewed: I have personally reviewed following labs and imaging studies  CBC: Recent Labs  Lab 11/09/20 0437 11/10/20 1248 11/11/20  0327  WBC 7.9 19.2* 12.4*  NEUTROABS 5.9  --   --   HGB 15.3 17.0 13.4  HCT 45.7 52.7* 40.9  MCV 84.2 87.7 86.3  PLT 276 426* 292   Basic Metabolic Panel: Recent Labs  Lab 11/10/20 1458 11/10/20 2019 11/10/20 2316 11/11/20 0327 11/11/20 0712  NA 138 143 142 138 140  K 6.1* 4.3 3.8 3.2* 3.1*  CL 104 110 115* 111 113*  CO2 <7* 9* 10* 14* 18*  GLUCOSE 623* 205* 201* 167* 164*  BUN 15 13 10 8 8   CREATININE 1.40* 1.10 0.90 0.78 0.74  CALCIUM 8.5* 8.7* 8.3* 8.1* 8.3*  MG  --   --   --   2.0  --   PHOS  --   --   --  2.7  --    GFR: Estimated Creatinine Clearance: 166.5 mL/min (by C-G formula based on SCr of 0.74 mg/dL). Liver Function Tests: Recent Labs  Lab 11/10/20 1248  AST 54*  ALT 46*  ALKPHOS 124  BILITOT 2.4*  PROT 9.1*  ALBUMIN 4.8   Recent Labs  Lab 11/10/20 1458  LIPASE 17   No results for input(s): AMMONIA in the last 168 hours. Coagulation Profile: No results for input(s): INR, PROTIME in the last 168 hours. Cardiac Enzymes: No results for input(s): CKTOTAL, CKMB, CKMBINDEX, TROPONINI in the last 168 hours. BNP (last 3 results) No results for input(s): PROBNP in the last 8760 hours. HbA1C: Recent Labs    11/09/20 0615  HGBA1C 10.5*   CBG: Recent Labs  Lab 11/11/20 0658 11/11/20 0802 11/11/20 0914 11/11/20 1123 11/11/20 1253  GLUCAP 172* 148* 163* 217* 209*   Lipid Profile: No results for input(s): CHOL, HDL, LDLCALC, TRIG, CHOLHDL, LDLDIRECT in the last 72 hours. Thyroid Function Tests: No results for input(s): TSH, T4TOTAL, FREET4, T3FREE, THYROIDAB in the last 72 hours. Anemia Panel: No results for input(s): VITAMINB12, FOLATE, FERRITIN, TIBC, IRON, RETICCTPCT in the last 72 hours. Sepsis Labs: Recent Labs  Lab 11/10/20 1252  LATICACIDVEN 2.8*    Recent Results (from the past 240 hour(s))  Resp Panel by RT-PCR (Flu A&B, Covid) Nasopharyngeal Swab     Status: None   Collection Time: 11/09/20  5:54 AM   Specimen: Nasopharyngeal Swab; Nasopharyngeal(NP) swabs in vial transport medium  Result Value Ref Range Status   SARS Coronavirus 2 by RT PCR NEGATIVE NEGATIVE Final    Comment: (NOTE) SARS-CoV-2 target nucleic acids are NOT DETECTED.  The SARS-CoV-2 RNA is generally detectable in upper respiratory specimens during the acute phase of infection. The lowest concentration of SARS-CoV-2 viral copies this assay can detect is 138 copies/mL. A negative result does not preclude SARS-Cov-2 infection and should not be used as  the sole basis for treatment or other patient management decisions. A negative result may occur with  improper specimen collection/handling, submission of specimen other than nasopharyngeal swab, presence of viral mutation(s) within the areas targeted by this assay, and inadequate number of viral copies(<138 copies/mL). A negative result must be combined with clinical observations, patient history, and epidemiological information. The expected result is Negative.  Fact Sheet for Patients:  11/11/20  Fact Sheet for Healthcare Providers:  BloggerCourse.com  This test is no t yet approved or cleared by the [place] FDA and  has been authorized for detection and/or diagnosis of SARS-CoV-2 by FDA under an Emergency Use Authorization (EUA). This EUA will remain  in effect (meaning this test can be used) for the duration of the  COVID-19 declaration under Section 564(b)(1) of the Act, 21 U.S.C.section 360bbb-3(b)(1), unless the authorization is terminated  or revoked sooner.       Influenza A by PCR NEGATIVE NEGATIVE Final   Influenza B by PCR NEGATIVE NEGATIVE Final    Comment: (NOTE) The Xpert Xpress SARS-CoV-2/FLU/RSV plus assay is intended as an aid in the diagnosis of influenza from Nasopharyngeal swab specimens and should not be used as a sole basis for treatment. Nasal washings and aspirates are unacceptable for Xpert Xpress SARS-CoV-2/FLU/RSV testing.  Fact Sheet for Patients: BloggerCourse.com  Fact Sheet for Healthcare Providers: SeriousBroker.it  This test is not yet approved or cleared by the [place] FDA and has been authorized for detection and/or diagnosis of SARS-CoV-2 by FDA under an Emergency Use Authorization (EUA). This EUA will remain in effect (meaning this test can be used) for the duration of the COVID-19 declaration under Section 564(b)(1) of  the Act, 21 U.S.C. section 360bbb-3(b)(1), unless the authorization is terminated or revoked.  Performed at [facility], [address] [place], [place] [address]   MRSA PCR Screening     Status: None   Collection Time: 11/09/20  8:30 AM   Specimen: Nasal Mucosa; Nasopharyngeal  Result Value Ref Range Status   MRSA by PCR NEGATIVE NEGATIVE Final    Comment:        The GeneXpert MRSA Assay (FDA approved for NASAL specimens only), is one component of a comprehensive MRSA colonization surveillance program. It is not intended to diagnose MRSA infection nor to guide or monitor treatment for MRSA infections. Performed at [facility], [address] [place], [place] [address]   Culture, blood (routine x 2)     Status: None (Preliminary result)   Collection Time: 11/10/20  3:22 PM   Specimen: BLOOD  Result Value Ref Range Status   Specimen Description   Final    BLOOD RIGHT ANTECUBITAL Performed at [facility], [address] [place], [place] [address]    Special Requests   Final    BOTTLES DRAWN AEROBIC AND ANAEROBIC Blood Culture adequate volume Performed at [facility], [address], [place], [place] [address]    Culture   Final    NO GROWTH < 24 HOURS Performed at [facility], [address], [place], [place] [address]    Report Status PENDING  Incomplete  Culture, blood (routine x 2)     Status: None (Preliminary result)   Collection Time: 11/10/20  3:22 PM   Specimen: BLOOD  Result Value Ref Range Status   Specimen Description   Final    BLOOD LEFT ANTECUBITAL Performed at [facility], [address] [place], [place] [address]    Special Requests   Final    BOTTLES DRAWN AEROBIC AND ANAEROBIC Blood Culture adequate volume Performed at [facility], [address] [place], [place] [address]    Culture   Final    NO GROWTH < 24  HOURS Performed at [facility], [address] [place], [place] [address]    Report Status PENDING  Incomplete  Resp Panel by RT-PCR (Flu A&B, Covid) Nasopharyngeal Swab     Status: None   Collection Time: 11/10/20  6:19 PM   Specimen: Nasopharyngeal Swab; Nasopharyngeal(NP) swabs in vial transport medium  Result Value Ref Range Status   SARS Coronavirus 2 by RT PCR NEGATIVE NEGATIVE Final    Comment: (NOTE) SARS-CoV-2 target  nucleic acids are NOT DETECTED.  The SARS-CoV-2 RNA is generally detectable in upper respiratory specimens during the acute phase of infection. The lowest concentration of SARS-CoV-2 viral copies this assay can detect is 138 copies/mL. A negative result does not preclude SARS-Cov-2 infection and should not be used as the sole basis for treatment or other patient management decisions. A negative result may occur with  improper specimen collection/handling, submission of specimen other than nasopharyngeal swab, presence of viral mutation(s) within the areas targeted by this assay, and inadequate number of viral copies(<138 copies/mL). A negative result must be combined with clinical observations, patient history, and epidemiological information. The expected result is Negative.  Fact Sheet for Patients:  BloggerCourse.com  Fact Sheet for Healthcare Providers:  SeriousBroker.it  This test is no t yet approved or cleared by the [place] FDA and  has been authorized for detection and/or diagnosis of SARS-CoV-2 by FDA under an Emergency Use Authorization (EUA). This EUA will remain  in effect (meaning this test can be used) for the duration of the COVID-19 declaration under Section 564(b)(1) of the Act, 21 U.S.C.section 360bbb-3(b)(1), unless the authorization is terminated  or revoked sooner.       Influenza A by PCR NEGATIVE NEGATIVE Final   Influenza B by PCR NEGATIVE NEGATIVE Final    Comment:  (NOTE) The Xpert Xpress SARS-CoV-2/FLU/RSV plus assay is intended as an aid in the diagnosis of influenza from Nasopharyngeal swab specimens and should not be used as a sole basis for treatment. Nasal washings and aspirates are unacceptable for Xpert Xpress SARS-CoV-2/FLU/RSV testing.  Fact Sheet for Patients: BloggerCourse.com  Fact Sheet for Healthcare Providers: SeriousBroker.it  This test is not yet approved or cleared by the [place] FDA and has been authorized for detection and/or diagnosis of SARS-CoV-2 by FDA under an Emergency Use Authorization (EUA). This EUA will remain in effect (meaning this test can be used) for the duration of the COVID-19 declaration under Section 564(b)(1) of the Act, 21 U.S.C. section 360bbb-3(b)(1), unless the authorization is terminated or revoked.  Performed at [facility], [address], [place], [place] [address]     Radiology Studies: [facility] Chest Port 1 View  Result Date: 11/10/2020 CLINICAL DATA:  Per order: DKA, hyperventilation. Pt unable to talk due to hyperventilating. Hx of asthma and diabetes. EXAM: PORTABLE CHEST 1 VIEW COMPARISON:  Chest radiograph 09/14/2020 FINDINGS: The heart size and mediastinal contours are within normal limits. Very low lung volumes which limits evaluation. No new focal opacity. No pneumothorax or large pleural effusion. No acute finding in the visualized skeleton. IMPRESSION: Low volume study.  No definite acute finding. Electronically Signed   By: Emmaline Kluver M.D.   On: 11/10/2020 15:36   Scheduled Meds: . Chlorhexidine Gluconate Cloth  6 each Topical Daily  . heparin injection (subcutaneous)  5,000 Units Subcutaneous Q8H  . insulin aspart  0-15 Units Subcutaneous TID WC  . insulin glargine  30 Units Subcutaneous BID  . mouth rinse  15 mL Mouth Rinse BID  . potassium chloride  40 mEq Oral Once   Continuous Infusions: . dextrose  5% lactated ringers 125 mL/hr at 11/11/20 1220  . lactated ringers Stopped (11/10/20 2011)     LOS: 1 day    Time spent: 35 mins    Maddilynn Esperanza, MD [facility]   If 7PM-7AM, please contact night-coverage

## 2020-11-11 NOTE — Progress Notes (Signed)
Patient complaining of chest pain 7/10 described as tightness which is worse when he takes a deep breath.  Patient's BP 140/103, HR 92, 100% RA, CBG 428. Dr. Lucianne Muss notified and new orders received

## 2020-11-11 NOTE — Progress Notes (Addendum)
Inpatient Diabetes Program Recommendations  AACE/ADA: New Consensus Statement on Inpatient Glycemic Control (2015)  Target Ranges:  Prepandial:   less than 140 mg/dL      Peak postprandial:   less than 180 mg/dL (1-2 hours)      Critically ill patients:  140 - 180 mg/dL   Lab Results  Component Value Date   GLUCAP 217 (H) 11/11/2020   HGBA1C 10.5 (H) 11/09/2020    Review of Glycemic Control  Diabetes history: type 1 Outpatient Diabetes medications: Lantus 30 units BID, Novolog 1-15 units TID (based on CHO intake) Current orders for Inpatient glycemic control: Lantus 10 units BID, Novolog 0-15 units TID  Inpatient Diabetes Program Recommendations:   Recommend increasing Lantus to home dose of 30 units BID. Continue Novolog MODERATE correction scale TID, add Novolog HS scale (0-5 units).   Well known to the Inpatient Diabetes Team--Frequent admissions/ ED visits for DKA--left AMA during last DKA episode (11/09/20) 5 admissions 2021 13 ED visits since January 2021  Will continue to follow while in the hospital.  Smith Mince RN BSN CDE Diabetes Coordinator Pager: [phone]  8am-5pm

## 2020-11-12 DIAGNOSIS — E1010 Type 1 diabetes mellitus with ketoacidosis without coma: Secondary | ICD-10-CM | POA: Diagnosis not present

## 2020-11-12 LAB — CBC
HCT: 39.9 % (ref 39.0–52.0)
Hemoglobin: 13.1 g/dL (ref 13.0–17.0)
MCH: 28.1 pg (ref 26.0–34.0)
MCHC: 32.8 g/dL (ref 30.0–36.0)
MCV: 85.4 fL (ref 80.0–100.0)
Platelets: 213 10*3/uL (ref 150–400)
RBC: 4.67 MIL/uL (ref 4.22–5.81)
RDW: 12.7 % (ref 11.5–15.5)
WBC: 6.6 10*3/uL (ref 4.0–10.5)
nRBC: 0 % (ref 0.0–0.2)

## 2020-11-12 LAB — BASIC METABOLIC PANEL
Anion gap: 12 (ref 5–15)
BUN: 11 mg/dL (ref 6–20)
CO2: 21 mmol/L — ABNORMAL LOW (ref 22–32)
Calcium: 8.2 mg/dL — ABNORMAL LOW (ref 8.9–10.3)
Chloride: 106 mmol/L (ref 98–111)
Creatinine, Ser: 0.68 mg/dL (ref 0.61–1.24)
GFR, Estimated: >60 mL/min (ref >60)
Glucose, Bld: 234 mg/dL — ABNORMAL HIGH (ref 70–99)
Potassium: 3.5 mmol/L (ref 3.5–5.1)
Sodium: 139 mmol/L (ref 135–145)

## 2020-11-12 LAB — GLUCOSE, CAPILLARY
Glucose-Capillary: 228 mg/dL — ABNORMAL HIGH (ref 70–99)
Glucose-Capillary: 388 mg/dL — ABNORMAL HIGH (ref 70–99)

## 2020-11-12 LAB — MAGNESIUM: Magnesium: 2.2 mg/dL (ref 1.7–2.4)

## 2020-11-12 LAB — PHOSPHORUS: Phosphorus: 2.4 mg/dL — ABNORMAL LOW (ref 2.5–4.6)
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
## 2020-11-12 NOTE — Progress Notes (Signed)
Patient refuses to wear heart monitor, states that he is going to be discharged soon and does not understand why he needs to wear the machine.  He is very pleasant and states that he appreciates my concern.  Notified MD that patient states that he is ready to be discharged now.

## 2020-11-12 NOTE — Progress Notes (Signed)
Patient preparing to be discharged when CBG noted to be 388. Patient addiment on leaving today. MD made aware and approved patient to be discharged. Sliding scale insulin given per orders. Education regarding home insulin regimen and endocrinologist follow up provided. All belongings sent home with patient and IV removed.

## 2020-11-12 NOTE — Discharge Instructions (Signed)
Diabetic Ketoacidosis Diabetic ketoacidosis (DKA) is a serious complication of diabetes. This condition develops when there is not enough insulin in the body. Insulin is a hormone that regulates blood sugar (glucose) levels in the body. Normally, insulin allows glucose to enter the cells in the body. The cells break down glucose for energy. Without enough insulin, the body cannot break down glucose and breaks down fats instead. This leads to high blood glucose levels in the body. It also leads to the production of acids that are called ketones. Ketones are poisonous at high levels. If diabetic ketoacidosis is not treated, it can cause severe dehydration and can lead to a coma or death. What are the causes? This condition develops when a lack of insulin causes the body to break down fats instead of glucose. This may be triggered by:  Stress on the body. This stress can be brought on by an illness.  Infection.  Medicines that raise blood glucose levels.  Not taking or skipping doses of diabetes medicines.  New onset of type 1 diabetes mellitus.  Missing insulin on purpose or by accident.  Interruption of insulin through an insulin pump. This can happen if the cannula that connects you to the insulin pump gets dislodged or kinked. What are the signs or symptoms? Symptoms of this condition include:  Excessive thirst or dry mouth.  Excessive urination.  Abdominal pain.  Nausea or vomiting.  Vision changes.  Fruity or sweet-smelling breath.  Weight loss.  Irritability or confusion.  Rapid breathing.  High blood glucose.  High levels of ketones in the body. To know your ketone levels: ? Collect urine in a small cup. ? Dip a test strip in the urine. ? Wait for it to change color. ? Compare the test strip results to the chart on the container. How is this diagnosed? This condition is diagnosed based on your medical history, a physical exam, and blood tests. You may also  have a urine test to check for ketones. How is this treated? This condition may be treated with:  Fluid replacement. This may be done with IV fluids to correct dehydration.  Correcting high blood glucose with insulin. This may be given through the skin as injections or through an IV.  Electrolyte replacement. Electrolytes are minerals in your blood. Electrolytes such as potassium and sodium may be given in pill form or through an IV.  Antibiotic medicines. These may be prescribed if your condition was caused by an infection. Diabetic ketoacidosis is a serious medical condition. You may need emergency treatment in the hospital so that you can be monitored closely. Follow these instructions at home: Medicines  Take over-the-counter and prescription medicines only as told by your health care provider.  Continue to take insulin and other diabetes medicines as told by your health care provider.  If you were prescribed an antibiotic medicine, take it as told by your health care provider. Do not stop taking the antibiotic even if you start to feel better. Eating and drinking  Drink enough fluids to keep your urine pale yellow.  If you are able to eat, follow your usual diet and drink sugar-free liquids such as water, tea, and sugar-free soft drinks. You can also have sugar-free gelatin or ice pops.  If you are not able to eat, drink liquids that contain sugar in small amounts as you are able. Liquids include fruit juice, regular soft drinks, and sherbet.   Checking ketones and blood glucose  Check your urine for  ketones when you are ill and as told by your health care provider. ? If your blood glucose is 240 mg/dL (44.0 mmol/L) or higher, check your urine ketones every 4 hours. If you have moderate or large ketones, call your health care provider.  Check your blood glucose every day, and as often as told by your health care provider. ? If your blood glucose is high, drink plenty of fluids.  This helps to flush out ketones. ? If your blood glucose is above your target for 2 tests in a row, contact your health care provider.   General instructions  Carry a medical alert card or wear medical alert jewelry that shows that you have diabetes.  Exercise only as told by your health care provider. Do not exercise when your blood glucose is high and you have ketones in your urine.  If you get sick, call your health care provider and begin treatment quickly. Your body often needs extra insulin to fight an illness. Check your blood glucose every 4 hours when you are sick.  Keep all follow-up visits as told by your health care provider. This is important. Where to find more information  [employer] Diabetes Association: diabetes.org Contact a health care provider if:  Your blood glucose level is higher than 240 mg/dL (34.7 mmol/L) for 2 days in a row.  You have moderate or large ketones in your urine.  You have a fever.  You cannot eat or drink without vomiting.  You have been vomiting for more than 2 hours.  You continue to have symptoms of diabetic ketoacidosis.  You develop new symptoms. Get help right away if:  Your blood glucose monitor reads high even when you are taking insulin.  You faint.  You have chest pain.  You have trouble breathing.  You have sudden trouble speaking or swallowing.  You have vomiting or diarrhea that gets worse after 3 hours.  You are unable to stay awake.  You have trouble thinking.  You are severely dehydrated. Symptoms of severe dehydration include: ? Extreme thirst. ? Dry mouth. ? Rapid breathing. These symptoms may represent a serious problem that is an emergency. Do not wait to see if the symptoms will go away. Get medical help right away. Call your local emergency services (911 in the [place] Do not drive yourself to the hospital. Summary  Diabetic ketoacidosis is a serious complication of diabetes. This condition develops when  there is not enough insulin in the body.  This condition is diagnosed based on your medical history, a physical exam, and blood tests. You may also have a urine test to check for ketones.  Diabetic ketoacidosis is a serious medical condition. You may need emergency treatment in the hospital to monitor your condition.  Contact your health care provider if your blood glucose is higher than 240 mg/dL for 2 days in a row or if you have moderate or large ketones in your urine. This information is not intended to replace advice given to you by your health care provider. Make sure you discuss any questions you have with your health care provider. Document Revised: 05/11/2019 Document Reviewed: 05/11/2019 [employer] Patient Education  2021 [employer] Advised to follow-up with primary care physician in 1 week. Advised to follow-up with endocrinologist in 1 week.   Advised to continue insulin as advised.

## 2020-11-12 NOTE — Discharge Summary (Signed)
Physician Discharge Summary  [name] [name]. PJA:[id number] DOB: 04-30-2001 DOA: 11/10/2020  PCP: Patient, No Pcp Per (Inactive)  Admit date: 11/10/2020   Discharge date: 11/12/2020  Admitted From: Home. Disposition:  Home.  Recommendations for Outpatient Follow-up:  1. Follow up with PCP in 1-2 weeks. 2. Please obtain BMP/CBC in one week. 3. Advised to follow-up with endocrinologist in 1 week.   4. Advised to continue insulin as advised.  Home Health:NONE Equipment/Devices: None  Discharge Condition: Stable CODE STATUS:Full code Diet recommendation: Carb Modified   Brief Summary / Hospital course: This 20 years old male with  type 1 diabetes which has been poorly controlled due to noncompliance, reported multiple hospitalizations , polysubstance abuse presented in the ED one day after leaving AMA for DKA from [facility] hospital with worsening DKA.  Patient was very short of breath and unable to provide history.  Urine drug screen is positive for amphetamine.  Patient was admitted in the ICU for high anion gap Metabolic acidosis due to DKA.  Patient was started on insulin infusion as per DKA protocol and aggressive IV hydration.  Anion gap closed, subsequently transitioned to long-acting and short acting subcu insulin.  Patient was started on carb modified diet,  tolerated well.  Patient's blood sugar has improved, diabetic coordinator consulted.  Patient was resumed on his home Lantus and regular insulin.  Patient complained about right-sided chest pain,  EKG was completed, no new changes as compared to prior EKG.  Troponins negative.  Patient feels better next day and wants to be discharged.  Patient is being discharged home and will follow up with endocrinologist.   He was managed for below problems.  Discharge Diagnoses:  Active Problems:   DKA (diabetic ketoacidosis) (HCC)  High anion gap metabolic acidosis due to DKA > Resolved. Recurrent DKA due to noncompliance and drug  abuse. Patient was recently admitted for diabetic ketoacidosis. She has signed [place]. She presented again in the ED with worsening DKA. Anion gap 24, BHBA > 8.0 >>>>  Anion gap closed Started on DKA protocol with insulin and aggressive IV fluids. Anion gap closed.  Patient is transitioned on Lantus and sliding scale. Resumed Lantus 30 units every 12 hours and moderate scale sliding scale. Continue IV hydration.,  Started on carb modified diet. Lipase negative.  Hold antibiotics.  Substance abuse: Urine drug screen amphetamine positive Patient needs counseling, social worker consult.  Discharge Instructions  Discharge Instructions    Call MD for:  difficulty breathing, headache or visual disturbances   Complete by: As directed    Call MD for:  persistant dizziness or light-headedness   Complete by: As directed    Call MD for:  persistant nausea and vomiting   Complete by: As directed    Diet - low sodium heart healthy   Complete by: As directed    Diet Carb Modified   Complete by: As directed    Discharge instructions   Complete by: As directed    Advised to follow-up with primary care physician in 1 week. Advised to follow-up with endocrinologist in 1 week.   Advised to continue insulin as advised.   Increase activity slowly   Complete by: As directed      Allergies as of 11/12/2020      Reactions   Bee Venom Anaphylaxis   Fish Allergy Anaphylaxis   Shellfish Allergy Anaphylaxis   Tea Anaphylaxis      Medication List    TAKE these medications  Advocate Insulin Pen Needles 29G X 12.7MM Misc Generic drug: Insulin Pen Needle For type I diabetes   blood glucose meter kit and supplies Kit Dispense based on patient and insurance preference. Use up to four times daily as directed. (FOR ICD-9 250.00, 250.01).   EPINEPHrine 0.3 mg/0.3 mL Soaj injection Commonly known as: EPI-PEN Inject 0.3 mg into the muscle as directed. Call 911   FREESTYLE TEST  STRIPS test strip Generic drug: glucose blood Use as instructed   insulin glargine 100 UNIT/ML Solostar Pen Commonly known as: LANTUS Inject 30 Units into the skin 2 (two) times daily.   NovoLOG FlexPen 100 UNIT/ML FlexPen Generic drug: insulin aspart Inject 1-15 Units into the skin 3 (three) times daily with meals. 1 unit/g of carbohydrate sliding scale.       Follow-up Information    Elayne Snare, MD Follow up.   Specialty: Endocrinology Contact information: [address] [place] [address] [phone]              Allergies  Allergen Reactions  . Bee Venom Anaphylaxis  . Fish Allergy Anaphylaxis  . Shellfish Allergy Anaphylaxis  . Tea Anaphylaxis    Consultations:  None   Procedures/Studies: DG Chest Port 1 View  Result Date: 11/10/2020 CLINICAL DATA:  Per order: DKA, hyperventilation. Pt unable to talk due to hyperventilating. Hx of asthma and diabetes. EXAM: PORTABLE CHEST 1 VIEW COMPARISON:  Chest radiograph 09/14/2020 FINDINGS: The heart size and mediastinal contours are within normal limits. Very low lung volumes which limits evaluation. No new focal opacity. No pneumothorax or large pleural effusion. No acute finding in the visualized skeleton. IMPRESSION: Low volume study.  No definite acute finding. Electronically Signed   By: Audie Pinto M.D.   On: 11/10/2020 15:36      Subjective: Patient was seen and examined at bedside.  Overnight events noted.  Patient reports feeling much improved.  Has tolerated carb modified diet.  Patient feels better and wants to be discharged.  Discharge Exam: Vitals:   11/12/20 0755 11/12/20 0800  BP:    Pulse:  91  Resp:  (!) 22  Temp: (!) 97 F (36.1 C)   SpO2:  100%   Vitals:   11/12/20 0400 11/12/20 0500 11/12/20 0755 11/12/20 0800  BP: (!) 122/55     Pulse:    91  Resp:    (!) 22  Temp:   (!) 97 F (36.1 C)   TempSrc:   Axillary   SpO2:    100%  Weight:  91.6 kg      General:  Pt is alert, awake, not in acute distress Cardiovascular: RRR, S1/S2 +, no rubs, no gallops Respiratory: CTA bilaterally, no wheezing, no rhonchi Abdominal: Soft, NT, ND, bowel sounds + Extremities: no edema, no cyanosis    The results of significant diagnostics from this hospitalization (including imaging, microbiology, ancillary and laboratory) are listed below for reference.     Microbiology: Recent Results (from the past 240 hour(s))  Resp Panel by RT-PCR (Flu A&B, Covid) Nasopharyngeal Swab     Status: None   Collection Time: 11/09/20  5:54 AM   Specimen: Nasopharyngeal Swab; Nasopharyngeal(NP) swabs in vial transport medium  Result Value Ref Range Status   SARS Coronavirus 2 by RT PCR NEGATIVE NEGATIVE Final    Comment: (NOTE) SARS-CoV-2 target nucleic acids are NOT DETECTED.  The SARS-CoV-2 RNA is generally detectable in upper respiratory specimens during the acute phase of infection. The lowest concentration of  SARS-CoV-2 viral copies this assay can detect is 138 copies/mL. A negative result does not preclude SARS-Cov-2 infection and should not be used as the sole basis for treatment or other patient management decisions. A negative result may occur with  improper specimen collection/handling, submission of specimen other than nasopharyngeal swab, presence of viral mutation(s) within the areas targeted by this assay, and inadequate number of viral copies(<138 copies/mL). A negative result must be combined with clinical observations, patient history, and epidemiological information. The expected result is Negative.  Fact Sheet for Patients:  EntrepreneurPulse.com.au  Fact Sheet for Healthcare Providers:  IncredibleEmployment.be  This test is no t yet approved or cleared by the [place] FDA and  has been authorized for detection and/or diagnosis of SARS-CoV-2 by FDA under an Emergency Use Authorization (EUA). This EUA will remain   in effect (meaning this test can be used) for the duration of the COVID-19 declaration under Section 564(b)(1) of the Act, 21 U.S.C.section 360bbb-3(b)(1), unless the authorization is terminated  or revoked sooner.       Influenza A by PCR NEGATIVE NEGATIVE Final   Influenza B by PCR NEGATIVE NEGATIVE Final    Comment: (NOTE) The Xpert Xpress SARS-CoV-2/FLU/RSV plus assay is intended as an aid in the diagnosis of influenza from Nasopharyngeal swab specimens and should not be used as a sole basis for treatment. Nasal washings and aspirates are unacceptable for Xpert Xpress SARS-CoV-2/FLU/RSV testing.  Fact Sheet for Patients: EntrepreneurPulse.com.au  Fact Sheet for Healthcare Providers: IncredibleEmployment.be  This test is not yet approved or cleared by the [place] FDA and has been authorized for detection and/or diagnosis of SARS-CoV-2 by FDA under an Emergency Use Authorization (EUA). This EUA will remain in effect (meaning this test can be used) for the duration of the COVID-19 declaration under Section 564(b)(1) of the Act, 21 U.S.C. section 360bbb-3(b)(1), unless the authorization is terminated or revoked.  Performed at [facility], [place] [address], [place], [place] [address]   MRSA PCR Screening     Status: None   Collection Time: 11/09/20  8:30 AM   Specimen: Nasal Mucosa; Nasopharyngeal  Result Value Ref Range Status   MRSA by PCR NEGATIVE NEGATIVE Final    Comment:        The GeneXpert MRSA Assay (FDA approved for NASAL specimens only), is one component of a comprehensive MRSA colonization surveillance program. It is not intended to diagnose MRSA infection nor to guide or monitor treatment for MRSA infections. Performed at [facility], [place] [address], [place], [place] [address]   Urine culture     Status: None   Collection Time: 11/10/20 12:48 PM   Specimen: Urine, Random   Result Value Ref Range Status   Specimen Description   Final    URINE, RANDOM Performed at [place] [address], [place], [name] [address]    Special Requests   Final    NONE Performed at [facility], [place] [address], [place], [place] [address]    Culture   Final    NO GROWTH Performed at [facility], [place] [address], [place], [place] [address]    Report Status 11/11/2020 FINAL  Final  Culture, blood (routine x 2)     Status: None (Preliminary result)   Collection Time: 11/10/20  3:22 PM   Specimen: BLOOD  Result Value Ref Range Status   Specimen Description   Final    BLOOD RIGHT ANTECUBITAL Performed at  [facility], [place] [address], [place], [place] [address]    Special Requests   Final    BOTTLES DRAWN AEROBIC AND ANAEROBIC Blood Culture adequate volume Performed at [place] [address] [place], [place] [address]    Culture   Final    NO GROWTH < 24 HOURS Performed at [place] [address], [place], [place] [address]    Report Status PENDING  Incomplete  Culture, blood (routine x 2)     Status: None (Preliminary result)   Collection Time: 11/10/20  3:22 PM   Specimen: BLOOD  Result Value Ref Range Status   Specimen Description   Final    BLOOD LEFT ANTECUBITAL Performed at [facility] [facility] [place] [address] [place], [place] [address]    Special Requests   Final    BOTTLES DRAWN AEROBIC AND ANAEROBIC Blood Culture adequate volume Performed at [place] [address] [place], [place] [address]    Culture   Final    NO GROWTH < 24 HOURS Performed at [place] [address], [place], [place] [address]    Report Status PENDING  Incomplete  Resp Panel by RT-PCR (Flu A&B, Covid) Nasopharyngeal Swab     Status: None   Collection Time: 11/10/20  6:19 PM   Specimen: Nasopharyngeal Swab; Nasopharyngeal(NP) swabs  in vial transport medium  Result Value Ref Range Status   SARS Coronavirus 2 by RT PCR NEGATIVE NEGATIVE Final    Comment: (NOTE) SARS-CoV-2 target nucleic acids are NOT DETECTED.  The SARS-CoV-2 RNA is generally detectable in upper respiratory specimens during the acute phase of infection. The lowest concentration of SARS-CoV-2 viral copies this assay can detect is 138 copies/mL. A negative result does not preclude SARS-Cov-2 infection and should not be used as the sole basis for treatment or other patient management decisions. A negative result may occur with  improper specimen collection/handling, submission of specimen other than nasopharyngeal swab, presence of viral mutation(s) within the areas targeted by this assay, and inadequate number of viral copies(<138 copies/mL). A negative result must be combined with clinical observations, patient history, and epidemiological information. The expected result is Negative.  Fact Sheet for Patients:  EntrepreneurPulse.com.au  Fact Sheet for Healthcare Providers:  IncredibleEmployment.be  This test is no t yet approved or cleared by the [place] FDA and  has been authorized for detection and/or diagnosis of SARS-CoV-2 by FDA under an Emergency Use Authorization (EUA). This EUA will remain  in effect (meaning this test can be used) for the duration of the COVID-19 declaration under Section 564(b)(1) of the Act, 21 U.S.C.section 360bbb-3(b)(1), unless the authorization is terminated  or revoked sooner.       Influenza A by PCR NEGATIVE NEGATIVE Final   Influenza B by PCR NEGATIVE NEGATIVE Final    Comment: (NOTE) The Xpert Xpress SARS-CoV-2/FLU/RSV plus assay is intended as an aid in the diagnosis of influenza from Nasopharyngeal swab specimens and should not be used as a sole basis for treatment. Nasal washings and aspirates are unacceptable for Xpert Xpress  SARS-CoV-2/FLU/RSV testing.  Fact Sheet for Patients: EntrepreneurPulse.com.au  Fact Sheet for Healthcare Providers: IncredibleEmployment.be  This test is not yet approved or cleared by the [place] FDA and has been authorized for detection and/or diagnosis of SARS-CoV-2 by FDA under an Emergency Use Authorization (EUA). This EUA will remain in effect (meaning this test can be used) for the duration of the  COVID-19 declaration under Section 564(b)(1) of the Act, 21 U.S.C. section 360bbb-3(b)(1), unless the authorization is terminated or revoked.  Performed at [facility], [place] [address] [place], [place] [address]      Labs: BNP (last 3 results) No results for input(s): BNP in the last 8760 hours. Basic Metabolic Panel: Recent Labs  Lab 11/10/20 2019 11/10/20 2316 11/11/20 0327 11/11/20 0712 11/11/20 1805 11/12/20 0301  NA 143 142 138 140  --  139  K 4.3 3.8 3.2* 3.1*  --  3.5  CL 110 115* 111 113*  --  106  CO2 9* 10* 14* 18*  --  21*  GLUCOSE 205* 201* 167* 164* 435* 234*  BUN _0 --  11  CREATININE 1.10 0.90 0.78 0.74  --  0.68  CALCIUM 8.7* 8.3* 8.1* 8.3*  --  8.2*  MG  --   --  2.0  --   --  2.2  PHOS  --   --  2.7  --   --  2.4*   Liver Function Tests: Recent Labs  Lab 11/10/20 1248  AST 54*  ALT 46*  ALKPHOS 124  BILITOT 2.4*  PROT 9.1*  ALBUMIN 4.8   Recent Labs  Lab 11/10/20 1458  LIPASE 17   No results for input(s): AMMONIA in the last 168 hours. CBC: Recent Labs  Lab 11/09/20 0437 11/10/20 1248 11/11/20 0327 11/12/20 0301  WBC 7.9 19.2* 12.4* 6.6  NEUTROABS 5.9  --   --   --   HGB 15.3 17.0 13.4 13.1  HCT 45.7 52.7* 40.9 39.9  MCV 84.2 87.7 86.3 85.4  PLT 276 426* 292 213   Cardiac Enzymes: No results for input(s): CKTOTAL, CKMB, CKMBINDEX, TROPONINI in the last 168 hours. BNP: Invalid input(s): POCBNP CBG: Recent Labs  Lab 11/11/20 1352 11/11/20 1655  11/11/20 1851 11/11/20 2143 11/12/20 0757  GLUCAP 191* 428* 389* 366* 228*   D-Dimer No results for input(s): DDIMER in the last 72 hours. Hgb A1c No results for input(s): HGBA1C in the last 72 hours. Lipid Profile No results for input(s): CHOL, HDL, LDLCALC, TRIG, CHOLHDL, LDLDIRECT in the last 72 hours. Thyroid function studies No results for input(s): TSH, T4TOTAL, T3FREE, THYROIDAB in the last 72 hours.  Invalid input(s): FREET3 Anemia work up No results for input(s): VITAMINB12, FOLATE, FERRITIN, TIBC, IRON, RETICCTPCT in the last 72 hours. Urinalysis    Component Value Date/Time   COLORURINE COLORLESS (A) 11/10/2020 1248   APPEARANCEUR CLEAR 11/10/2020 1248   LABSPEC 1.015 11/10/2020 1248   PHURINE 5.0 11/10/2020 1248   GLUCOSEU >=500 (A) 11/10/2020 1248   HGBUR NEGATIVE 11/10/2020 1248   BILIRUBINUR NEGATIVE 11/10/2020 1248   KETONESUR 80 (A) 11/10/2020 1248   PROTEINUR NEGATIVE 11/10/2020 1248   UROBILINOGEN 0.2 09/18/2019 1339   NITRITE NEGATIVE 11/10/2020 1248   LEUKOCYTESUR NEGATIVE 11/10/2020 1248   Sepsis Labs Invalid input(s): PROCALCITONIN,  WBC,  LACTICIDVEN Microbiology Recent Results (from the past 240 hour(s))  Resp Panel by RT-PCR (Flu A&B, Covid) Nasopharyngeal Swab     Status: None   Collection Time: 11/09/20  5:54 AM   Specimen: Nasopharyngeal Swab; Nasopharyngeal(NP) swabs in vial transport medium  Result Value Ref Range Status   SARS Coronavirus 2 by RT PCR NEGATIVE NEGATIVE Final    Comment: (NOTE) SARS-CoV-2 target nucleic acids are NOT DETECTED.  The SARS-CoV-2 RNA is generally detectable in upper respiratory specimens during the acute phase of infection. The lowest concentration of SARS-CoV-2  viral copies this assay can detect is 138 copies/mL. A negative result does not preclude SARS-Cov-2 infection and should not be used as the sole basis for treatment or other patient management decisions. A negative result may occur with  improper  specimen collection/handling, submission of specimen other than nasopharyngeal swab, presence of viral mutation(s) within the areas targeted by this assay, and inadequate number of viral copies(<138 copies/mL). A negative result must be combined with clinical observations, patient history, and epidemiological information. The expected result is Negative.  Fact Sheet for Patients:  EntrepreneurPulse.com.au  Fact Sheet for Healthcare Providers:  IncredibleEmployment.be  This test is no t yet approved or cleared by the [place] FDA and  has been authorized for detection and/or diagnosis of SARS-CoV-2 by FDA under an Emergency Use Authorization (EUA). This EUA will remain  in effect (meaning this test can be used) for the duration of the COVID-19 declaration under Section 564(b)(1) of the Act, 21 U.S.C.section 360bbb-3(b)(1), unless the authorization is terminated  or revoked sooner.       Influenza A by PCR NEGATIVE NEGATIVE Final   Influenza B by PCR NEGATIVE NEGATIVE Final    Comment: (NOTE) The Xpert Xpress SARS-CoV-2/FLU/RSV plus assay is intended as an aid in the diagnosis of influenza from Nasopharyngeal swab specimens and should not be used as a sole basis for treatment. Nasal washings and aspirates are unacceptable for Xpert Xpress SARS-CoV-2/FLU/RSV testing.  Fact Sheet for Patients: EntrepreneurPulse.com.au  Fact Sheet for Healthcare Providers: IncredibleEmployment.be  This test is not yet approved or cleared by the [place] FDA and has been authorized for detection and/or diagnosis of SARS-CoV-2 by FDA under an Emergency Use Authorization (EUA). This EUA will remain in effect (meaning this test can be used) for the duration of the COVID-19 declaration under Section 564(b)(1) of the Act, 21 U.S.C. section 360bbb-3(b)(1), unless the authorization is terminated or revoked.  Performed at  [facility], [place] [address], [place], [place] [address]   MRSA PCR Screening     Status: None   Collection Time: 11/09/20  8:30 AM   Specimen: Nasal Mucosa; Nasopharyngeal  Result Value Ref Range Status   MRSA by PCR NEGATIVE NEGATIVE Final    Comment:        The GeneXpert MRSA Assay (FDA approved for NASAL specimens only), is one component of a comprehensive MRSA colonization surveillance program. It is not intended to diagnose MRSA infection nor to guide or monitor treatment for MRSA infections. Performed at [facility], [place] [address] [place], [place] [address]   Urine culture     Status: None   Collection Time: 11/10/20 12:48 PM   Specimen: Urine, Random  Result Value Ref Range Status   Specimen Description   Final    URINE, RANDOM Performed at [place] [address] [place], [place] [address]    Special Requests   Final    NONE Performed at [facility], [place] [address] [place], [place] [address]    Culture   Final    NO GROWTH Performed at [facility], [place] [address] [place], [place] [address]    Report Status 11/11/2020 FINAL  Final  Culture, blood (routine x 2)     Status: None (Preliminary result)   Collection Time: 11/10/20  3:22 PM   Specimen: BLOOD  Result Value Ref Range Status   Specimen Description   Final    BLOOD RIGHT ANTECUBITAL Performed at [facility]  [place] [address] [place], [place] [address]    Special Requests   Final    BOTTLES DRAWN AEROBIC AND ANAEROBIC Blood Culture adequate volume Performed at [place] [address], [place], [place] [address]    Culture   Final    NO GROWTH < 24 HOURS Performed at [place] [address] [place], [place] [address]    Report Status PENDING  Incomplete  Culture, blood (routine x 2)     Status: None (Preliminary result)   Collection Time: 11/10/20  3:22  PM   Specimen: BLOOD  Result Value Ref Range Status   Specimen Description   Final    BLOOD LEFT ANTECUBITAL Performed at [place] [address] [place], [place] [address]    Special Requests   Final    BOTTLES DRAWN AEROBIC AND ANAEROBIC Blood Culture adequate volume Performed at [place] [address], [place], [place] [address]    Culture   Final    NO GROWTH < 24 HOURS Performed at [place] [address] [place], [place] [address]    Report Status PENDING  Incomplete  Resp Panel by RT-PCR (Flu A&B, Covid) Nasopharyngeal Swab     Status: None   Collection Time: 11/10/20  6:19 PM   Specimen: Nasopharyngeal Swab; Nasopharyngeal(NP) swabs in vial transport medium  Result Value Ref Range Status   SARS Coronavirus 2 by RT PCR NEGATIVE NEGATIVE Final    Comment: (NOTE) SARS-CoV-2 target nucleic acids are NOT DETECTED.  The SARS-CoV-2 RNA is generally detectable in upper respiratory specimens during the acute phase of infection. The lowest concentration of SARS-CoV-2 viral copies this assay can detect is 138 copies/mL. A negative result does not preclude SARS-Cov-2 infection and should not be used as the sole basis for treatment or other patient management decisions. A negative result may occur with  improper specimen collection/handling, submission of specimen other than nasopharyngeal swab, presence of viral mutation(s) within the areas targeted by this assay, and inadequate number of viral copies(<138 copies/mL). A negative result must be combined with clinical observations, patient history, and epidemiological information. The expected result is Negative.  Fact Sheet for Patients:  EntrepreneurPulse.com.au  Fact Sheet for Healthcare Providers:  IncredibleEmployment.be  This test is no t yet approved or cleared by the [place] FDA and  has been authorized for detection  and/or diagnosis of SARS-CoV-2 by FDA under an Emergency Use Authorization (EUA). This EUA will remain  in effect (meaning this test can be used) for the duration of the COVID-19 declaration under Section 564(b)(1) of the Act, 21 U.S.C.section 360bbb-3(b)(1), unless the authorization is terminated  or revoked sooner.       Influenza A by PCR NEGATIVE NEGATIVE Final   Influenza B by PCR NEGATIVE NEGATIVE Final    Comment: (NOTE) The Xpert Xpress SARS-CoV-2/FLU/RSV plus assay is intended as an aid in the diagnosis of influenza from Nasopharyngeal swab specimens and should not be used as a sole basis for treatment. Nasal washings and aspirates are unacceptable for Xpert Xpress SARS-CoV-2/FLU/RSV testing.  Fact Sheet for Patients: EntrepreneurPulse.com.au  Fact Sheet for Healthcare Providers: IncredibleEmployment.be  This test is not yet approved or cleared by the [place] FDA and has been authorized for detection and/or diagnosis of SARS-CoV-2 by FDA under an Emergency Use Authorization (EUA). This EUA will remain in effect (meaning this test can be used) for the duration of the COVID-19  declaration under Section 564(b)(1) of the Act, 21 U.S.C. section 360bbb-3(b)(1), unless the authorization is terminated or revoked.  Performed at [facility], [place] [address], [place], [place] [address]      Time coordinating discharge: Over 30 minutes  SIGNED:   Shawna Clamp, MD  [facility] 11/12/2020, 11:26 AM Pager   If 7PM-7AM, please contact night-coverage [email or web address]

## 2020-11-15 LAB — CULTURE, BLOOD (ROUTINE X 2)
Culture: NO GROWTH
Culture: NO GROWTH
Special Requests: ADEQUATE
Special Requests: ADEQUATE

## 2020-12-10 ENCOUNTER — Other Ambulatory Visit: Payer: Self-pay

## 2020-12-10 ENCOUNTER — Observation Stay (HOSPITAL_COMMUNITY)
Admission: EM | Admit: 2020-12-10 | Discharge: 2020-12-11 | Disposition: A | Discharge status: Home / Self Care | Payer: Medicaid Other | Attending: Internal Medicine | Admitting: Internal Medicine

## 2020-12-10 DIAGNOSIS — R7401 Elevation of levels of liver transaminase levels: Secondary | ICD-10-CM | POA: Diagnosis present

## 2020-12-10 DIAGNOSIS — Z720 Tobacco use: Secondary | ICD-10-CM | POA: Diagnosis present

## 2020-12-10 DIAGNOSIS — Y900 Blood alcohol level of less than 20 mg/100 ml: Secondary | ICD-10-CM | POA: Diagnosis not present

## 2020-12-10 DIAGNOSIS — F17210 Nicotine dependence, cigarettes, uncomplicated: Secondary | ICD-10-CM | POA: Insufficient documentation

## 2020-12-10 DIAGNOSIS — E1010 Type 1 diabetes mellitus with ketoacidosis without coma: Secondary | ICD-10-CM | POA: Diagnosis not present

## 2020-12-10 DIAGNOSIS — R4182 Altered mental status, unspecified: Secondary | ICD-10-CM | POA: Diagnosis present

## 2020-12-10 DIAGNOSIS — F1910 Other psychoactive substance abuse, uncomplicated: Secondary | ICD-10-CM

## 2020-12-10 DIAGNOSIS — E875 Hyperkalemia: Secondary | ICD-10-CM | POA: Diagnosis not present

## 2020-12-10 DIAGNOSIS — F1520 Other stimulant dependence, uncomplicated: Secondary | ICD-10-CM | POA: Diagnosis not present

## 2020-12-10 DIAGNOSIS — Z794 Long term (current) use of insulin: Secondary | ICD-10-CM | POA: Diagnosis not present

## 2020-12-10 DIAGNOSIS — N179 Acute kidney failure, unspecified: Secondary | ICD-10-CM | POA: Diagnosis not present

## 2020-12-10 DIAGNOSIS — E1110 Type 2 diabetes mellitus with ketoacidosis without coma: Secondary | ICD-10-CM | POA: Diagnosis present

## 2020-12-10 DIAGNOSIS — J45909 Unspecified asthma, uncomplicated: Secondary | ICD-10-CM | POA: Insufficient documentation

## 2020-12-10 DIAGNOSIS — R748 Abnormal levels of other serum enzymes: Secondary | ICD-10-CM | POA: Diagnosis present

## 2020-12-10 DIAGNOSIS — F1510 Other stimulant abuse, uncomplicated: Secondary | ICD-10-CM | POA: Diagnosis present

## 2020-12-10 LAB — URINALYSIS, ROUTINE W REFLEX MICROSCOPIC
Bacteria, UA: NONE SEEN
Bilirubin Urine: NEGATIVE
Glucose, UA: >=500 mg/dL — AB
Hgb urine dipstick: NEGATIVE
Ketones, ur: 80 mg/dL — AB
Leukocytes,Ua: NEGATIVE
Nitrite: NEGATIVE
Protein, ur: NEGATIVE mg/dL
Specific Gravity, Urine: 1.021 (ref 1.005–1.030)
pH: 5 (ref 5.0–8.0)

## 2020-12-10 LAB — CBC WITH DIFFERENTIAL/PLATELET
Abs Immature Granulocytes: 0.13 10*3/uL — ABNORMAL HIGH (ref 0.00–0.07)
Basophils Absolute: 0.1 10*3/uL (ref 0.0–0.1)
Basophils Relative: 1 %
Eosinophils Absolute: 0 10*3/uL (ref 0.0–0.5)
Eosinophils Relative: 0 %
HCT: 50.5 % (ref 39.0–52.0)
Hemoglobin: 16.8 g/dL (ref 13.0–17.0)
Immature Granulocytes: 1 %
Lymphocytes Relative: 11 %
Lymphs Abs: 1 10*3/uL (ref 0.7–4.0)
MCH: 27.8 pg (ref 26.0–34.0)
MCHC: 33.3 g/dL (ref 30.0–36.0)
MCV: 83.6 fL (ref 80.0–100.0)
Monocytes Absolute: 0.5 10*3/uL (ref 0.1–1.0)
Monocytes Relative: 5 %
Neutro Abs: 7.9 10*3/uL — ABNORMAL HIGH (ref 1.7–7.7)
Neutrophils Relative %: 82 %
Platelets: 267 10*3/uL (ref 150–400)
RBC: 6.04 MIL/uL — ABNORMAL HIGH (ref 4.22–5.81)
RDW: 11.8 % (ref 11.5–15.5)
WBC: 9.7 10*3/uL (ref 4.0–10.5)
nRBC: 0 % (ref 0.0–0.2)

## 2020-12-10 LAB — COMPREHENSIVE METABOLIC PANEL
ALT: 792 U/L — ABNORMAL HIGH (ref 0–44)
AST: 802 U/L — ABNORMAL HIGH (ref 15–41)
Albumin: 4.3 g/dL (ref 3.5–5.0)
Alkaline Phosphatase: 166 U/L — ABNORMAL HIGH (ref 38–126)
Anion gap: 27 — ABNORMAL HIGH (ref 5–15)
BUN: 15 mg/dL (ref 6–20)
CO2: 8 mmol/L — ABNORMAL LOW (ref 22–32)
Calcium: 8.8 mg/dL — ABNORMAL LOW (ref 8.9–10.3)
Chloride: 96 mmol/L — ABNORMAL LOW (ref 98–111)
Creatinine, Ser: 1.12 mg/dL (ref 0.61–1.24)
GFR, Estimated: >60 mL/min (ref >60)
Glucose, Bld: 496 mg/dL — ABNORMAL HIGH (ref 70–99)
Potassium: 5.8 mmol/L — ABNORMAL HIGH (ref 3.5–5.1)
Sodium: 131 mmol/L — ABNORMAL LOW (ref 135–145)
Total Bilirubin: 3.1 mg/dL — ABNORMAL HIGH (ref 0.3–1.2)
Total Protein: 8.1 g/dL (ref 6.5–8.1)

## 2020-12-10 LAB — RAPID URINE DRUG SCREEN, HOSP PERFORMED
Amphetamines: POSITIVE — AB
Barbiturates: NOT DETECTED
Benzodiazepines: NOT DETECTED
Cocaine: NOT DETECTED
Opiates: NOT DETECTED
Tetrahydrocannabinol: NOT DETECTED

## 2020-12-10 LAB — ETHANOL: Alcohol, Ethyl (B): <10 mg/dL (ref <10)

## 2020-12-10 LAB — CBG MONITORING, ED: Glucose-Capillary: 484 mg/dL — ABNORMAL HIGH (ref 70–99)

## 2020-12-10 MED ORDER — DEXTROSE IN LACTATED RINGERS 5 % IV SOLN: INTRAVENOUS | Status: DC

## 2020-12-10 MED ORDER — ONDANSETRON HCL 4 MG/2ML IJ SOLN
4.0000 mg | Freq: Once | INTRAMUSCULAR | Status: DC
  Filled 2020-12-10: qty 2

## 2020-12-10 MED ORDER — ACETAMINOPHEN 650 MG RE SUPP: 650.0000 mg | Freq: Four times a day (QID) | RECTAL | Status: DC | PRN

## 2020-12-10 MED ORDER — ACETAMINOPHEN 325 MG PO TABS: 650.0000 mg | ORAL_TABLET | Freq: Four times a day (QID) | ORAL | Status: DC | PRN

## 2020-12-10 MED ORDER — DEXTROSE 50 % IV SOLN: 0.0000 mL | INTRAVENOUS | Status: DC | PRN

## 2020-12-10 MED ORDER — LACTATED RINGERS IV BOLUS: 20.0000 mL/kg | Freq: Once | INTRAVENOUS | Status: DC

## 2020-12-10 MED ORDER — INSULIN REGULAR(HUMAN) IN NACL 100-0.9 UT/100ML-% IV SOLN
INTRAVENOUS | Status: DC
  Filled 2020-12-10: qty 100

## 2020-12-10 MED ORDER — SODIUM CHLORIDE 0.9 % IV BOLUS: 1000.0000 mL | Freq: Once | INTRAVENOUS | Status: DC

## 2020-12-10 MED ORDER — LACTATED RINGERS IV SOLN: INTRAVENOUS | Status: DC

## 2020-12-10 NOTE — ED Notes (Signed)
IV team RN arrived to bedside.

## 2020-12-10 NOTE — ED Notes (Signed)
Pt wanting to leave AMA and telling IV team RN not to place IV.  Messaged MD to make aware.

## 2020-12-10 NOTE — ED Notes (Signed)
Sig pad not working correctly
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
## 2020-12-10 NOTE — Progress Notes (Signed)
Brief note regarding plan, with full H&P to follow:  20 year old male with history of type 1 diabetes history of recurrent DKA, whom I have been asked to admit DKA.  DKA protocol initiated.  Insulin drip.  Every hour Accu-Cheks.  Every 4 hours BMPs x5 occurrences of been ordered.  IV fluids.  Of note, while at the moment he is amenable to staying in the hospital for evaluation and management of DKA , the patient may subsequently wish to leave AMA. He is able to leave AMA, if he subsequently wishes.      Newton Pigg, DO Hospitalist
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
## 2020-12-10 NOTE — ED Notes (Signed)
Writer and second RN attempted to insert piv.  Both RN's unsuccessful.

## 2020-12-10 NOTE — ED Notes (Signed)
Third nurse attempted IV insertion, unsuccessful.  IV team consult placed. MD aware.

## 2020-12-10 NOTE — H&P (Signed)
History and Physical    PLEASE NOTE THAT DRAGON DICTATION SOFTWARE WAS USED IN THE CONSTRUCTION OF THIS NOTE.   [name] [name]. FXT:[id number] DOB: [birth year] DOA: 12/10/2020  PCP: Patient, No Pcp Per (Inactive) Patient coming from: home   I have personally briefly reviewed patient's old medical records in [place]  Chief Complaint: Confusion  HPI: [name] [name]. is a 20 y.o. male with medical history significant for type 1 diabetes mellitus with recurrent DKA, methamphetamine abuse, chronic tobacco abuse, who is admitted to [facility] on 12/10/2020 with DKA after presenting from home to [facility] ED for evaluation of confusion after reportedly taking meth at a party.   Of note, at the patient left the emergency department AMA before I had the opportunity to see him.  Exactly 20 minutes received a admission page from the emergency department physician until I was notified that the patient leaving AMA.  As a consequence, the following history was obtained via my discussions with the emergency department physician who was able to evaluate the patient times prior to him leaving AMA from the ED.   The patient was initially noted to be of altered mental status, noted to be staring at the wall staring at electrical cords, not initially responding to questions posed to him.  Shortly after presenting to the ED, his mental status reportedly began to improve at which time he was able to convey that he was participating in some recreational drug use daily this evening before taking a stimulant medication that ultimately rendered him altered, prompting him to be brought lexical emergency department for further evaluation.  Patient reportedly did not confirm the specific nature of the recreational drugs that he took at the party, although chart review reveals that the patient has a history of recurrent methamphetamine abuse.  As his mental status improved and he was noted to be alert and  oriented x4, the patient denied any acute complaints, acknowledged his underlying DKA was identified in the emergency department this evening, as further outlined below.   Per chart review, the patient has a documented history of leaving the hospital AMA.  In anticipation of this, the emergency department physician, Dr. Alvino Chapel, discussed with the patient the indications for remaining in the hospital for further evaluation management of his DKA, including discussing with the patient the risks of leaving the hospital AMA, particularly in the setting of active DKA with hyperkalemia, noting to the patient that these risks included the possibility of death.  Per these discussions with Dr. Alvino Chapel, the patient acknowledged his presenting DKA and conveyed his understanding of the risks of leaving the hospital AMA, including increased risk of death.  At the conclusion of these conversations, the patient conveyed to Dr. Alvino Chapel that he was amenable to staying in the hospital for further evaluation management of his presenting DKA, at which time Dr. Alvino Chapel contacted me for admission to the hospitalist service for further eval management of DKA.   However, in the period of 20 minutes from [place] admission page, I was notified by nursing staff that the patient was leaving AMA.  He ultimately left the hospital before I have the opportunity to see him.     ED Course:  Vital signs in the ED were notable for the following: Tetramex 98.7, heart rate 130s to 140s; blood pressure 140/96; respiratory rate 26-30; oxygen saturation 97% on room air.  Labs were notable for the following: Point-of-care glucose 484.  VBG 7.232/21.5.  Beta hydroxybutyric  acid greater than 8.  CMP was notable for the following: Sodium 131, with correction of approximately 137 when taking into account concomitant hyperglycemia, potassium 5.8, bicarbonate 8, anion gap 27, BUN 15, creatinine 1.12 relative to most recent prior creatinine  data point of 0.68 on 11/12/2020, alkaline phosphatase 166, AST 802, ALT 792, total bilirubin 3.1.  High-sensitivity troponin I x1 was found to be 2.  CBC was notable for the following: White blood cell count of 9700, hemoglobin 16.8.  Urinalysis showed no white blood cells, no bacteria, nitrate negative, Kasie Estrace negative, specific gravity 1.021 and showed 80 ketones.  Serum ethanol less than 10.  Urinary drug screen is positive for amphetamines.  Nasopharyngeal COVID-19 PCR was checked in the ED this evening, with result currently pending.  EKG showed sinus tachycardia with heart rate 148, normal intervals, nonspecific T wave inversion in lead III, and less than 1 mm ST elevation in V2, V3, without any additional evidence of ST elevation.  Establishing vascular access in the ED was challenging ultimately the IV team was consulted to assist with this process.  Left AMA prior to establishing vascular access, and therefore he did not receive any IV fluids or IV insulin or any other intravenous medications during his ED course prior to leaving AMA.      Review of Systems: As per HPI otherwise 10 point review of systems negative.   Past Medical History:  Diagnosis Date   ADD (attention deficit disorder)    Allergy    Anxiety    Asthma    Depression    Epileptic seizure ([place])    "both petit and grand mal; last sz ~ 2 wk ago" (06/17/2013)   WLNLGXQJ(194.1)    "2-3 times/wk usually" (06/17/2013)   Heart murmur    "heard a slight one earlier today" (06/17/2013)   Homeless    Migraine    "maybe once/month; it's severe" (06/17/2013)   ODD (oppositional defiant disorder)    Sickle cell trait ([place])    Type I diabetes mellitus ([place])    "that's what they're thinking now" (06/17/2013)   Vision abnormalities    "takes him longer to focus cause; from his sz" (06/17/2013)    Past Surgical History:  Procedure Laterality Date   CIRCUMCISION  2000   FINGER SURGERY Left 2001   "crushed pinky;  had to repair it" (06/17/2013)    Social History:  reports that he has been smoking cigarettes. He has been smoking an average of 0.50 packs per day. He has never used smokeless tobacco. He reports current drug use. Frequency: 7.00 times per week. Drugs: Marijuana and Methamphetamines. He reports that he does not drink alcohol.   Allergies  Allergen Reactions   Bee Venom Anaphylaxis   Fish Allergy Anaphylaxis   Shellfish Allergy Anaphylaxis   Tea Anaphylaxis    Family History  Problem Relation Age of Onset   Asthma Mother    COPD Mother    Other Mother        possible autoimmune, unclear   Diabetes Maternal Grandmother    Heart disease Maternal Grandmother    Hypertension Maternal Grandmother    Mental illness Maternal Grandmother    Heart disease Maternal Grandfather    Hyperlipidemia Paternal Grandmother    Hyperlipidemia Paternal Grandfather    Migraines Sister        Hemiplegic Migraines     Family history reviewed and not pertinent    Prior to Admission medications   Medication Sig Start Date  End Date Taking? Authorizing Provider  blood glucose meter kit and supplies KIT Dispense based on patient and insurance preference. Use up to four times daily as directed. (FOR ICD-9 250.00, 250.01). 11/09/20   Florencia Reasons, MD  EPINEPHrine 0.3 mg/0.3 mL IJ SOAJ injection Inject 0.3 mg into the muscle as directed. Call 911 05/02/20   [provider]  glucose blood (FREESTYLE TEST STRIPS) test strip Use as instructed 11/09/20   Florencia Reasons, MD  insulin aspart (NOVOLOG FLEXPEN) 100 UNIT/ML FlexPen Inject 1-15 Units into the skin 3 (three) times daily with meals. 1 unit/g of carbohydrate sliding scale. 11/09/20   Florencia Reasons, MD  insulin glargine (LANTUS) 100 UNIT/ML Solostar Pen Inject 30 Units into the skin 2 (two) times daily. 11/09/20   Florencia Reasons, MD  Insulin Pen Needle (ADVOCATE INSULIN PEN NEEDLES) 29G X 12.7MM MISC For type I diabetes 11/09/20   Florencia Reasons, MD     Objective     Physical Exam: Vitals:   12/10/20 2123 12/10/20 2129 12/10/20 2230 12/10/20 2300  BP:   (!) 162/106 (!) 140/96  Pulse:   (!) 144 (!) 137  Resp: (!) 30  (!) 26 (!) 31  Temp:      TempSrc:      SpO2:   97% 97%  Weight:  88.5 kg    Height:  '5\' 10"'  (1.778 m)      (Patient left AMA prior to me being able to evaluate him in person).     Labs on Admission: I have personally reviewed following labs and imaging studies  CBC: Recent Labs  Lab 12/10/20 2130  WBC 9.7  NEUTROABS 7.9*  HGB 16.8  HCT 50.5  MCV 83.6  PLT 968   Basic Metabolic Panel: Recent Labs  Lab 12/10/20 2130  NA 131*  K 5.8*  CL 96*  CO2 8*  GLUCOSE 496*  BUN 15  CREATININE 1.12  CALCIUM 8.8*   GFR: Estimated Creatinine Clearance: 116.9 mL/min (by C-G formula based on SCr of 1.12 mg/dL). Liver Function Tests: Recent Labs  Lab 12/10/20 2130  AST 802*  ALT 792*  ALKPHOS 166*  BILITOT 3.1*  PROT 8.1  ALBUMIN 4.3   No results for input(s): LIPASE, AMYLASE in the last 168 hours. No results for input(s): AMMONIA in the last 168 hours. Coagulation Profile: No results for input(s): INR, PROTIME in the last 168 hours. Cardiac Enzymes: No results for input(s): CKTOTAL, CKMB, CKMBINDEX, TROPONINI in the last 168 hours. BNP (last 3 results) No results for input(s): PROBNP in the last 8760 hours. HbA1C: No results for input(s): HGBA1C in the last 72 hours. CBG: Recent Labs  Lab 12/10/20 2130  GLUCAP 484*   Lipid Profile: No results for input(s): CHOL, HDL, LDLCALC, TRIG, CHOLHDL, LDLDIRECT in the last 72 hours. Thyroid Function Tests: No results for input(s): TSH, T4TOTAL, FREET4, T3FREE, THYROIDAB in the last 72 hours. Anemia Panel: No results for input(s): VITAMINB12, FOLATE, FERRITIN, TIBC, IRON, RETICCTPCT in the last 72 hours. Urine analysis:    Component Value Date/Time   COLORURINE STRAW (A) 12/10/2020 2131   APPEARANCEUR CLEAR 12/10/2020 2131   LABSPEC 1.021 12/10/2020 2131    PHURINE 5.0 12/10/2020 2131   GLUCOSEU >=500 (A) 12/10/2020 2131   HGBUR NEGATIVE 12/10/2020 2131   BILIRUBINUR NEGATIVE 12/10/2020 2131   KETONESUR 80 (A) 12/10/2020 2131   PROTEINUR NEGATIVE 12/10/2020 2131   UROBILINOGEN 0.2 09/18/2019 1339   NITRITE NEGATIVE 12/10/2020 2131   LEUKOCYTESUR NEGATIVE 12/10/2020 2131  Radiological Exams on Admission: No results found.   EKG: Independently reviewed, with result as described above.    Assessment/Plan   [name] Coxe. is a 20 y.o. male with medical history significant for type 1 diabetes mellitus with recurrent DKA, methamphetamine abuse, chronic tobacco abuse, who is admitted to [facility] on 12/10/2020 with DKA after presenting from home to [facility] ED for evaluation of confusion after reportedly taking meth at a party.    Principal Problem:   DKA (diabetic ketoacidosis) ([place]) Active Problems:   AKI (acute kidney injury) ([place])   Hyperkalemia   Transaminitis   Methamphetamine abuse ([place])   Tobacco abuse     As noted above, the patient left the emergency department AMA prior to me being able to evaluate him.  However, initial problem list and associated plan for additional evaluation and management of these items is described below.  Of note, this management and additional treatment did not occur, as the patient left the emergency department AMA prior to execution of these measures.     #) Diabetic ketoacidosis: In the setting of a known history of poor controlled type 1 diabetes , with prior hospitalizations for DKA, on home insulin in the form of Lantus 30 units subcu twice daily as well as NovoLog sliding scale insulin 3 times daily with meals, dx of dka on the basis of presenting blood sugar of of 484 associated with a presenting ABG/VBG demonstrating evidence of metabolic acidosis while presenting CMP consistent with an anion gap metabolic acidosis with serum bicarbonate of 8 and anion gap of 27, and beta  hydroxybutyric acid elevated at greater than 8. Additional presenting labs notable for corrected serum sodium of 137 and serum potassium of 5.8.  Suspect contribution to presenting DKA from suboptimal compliance with outpatient insulin regimen, as well as reported recreational drug use, suspected to be methamphetamine given the patient's initial presentation as well as ensuing results of urinary drug screen positive for amphetamine. No evidence at this time to suggest underlying precipitating infectious etiology, although we were unable to check urinalysis or chest x-ray to further evaluate this possibility as the patient left AMA.  Additionally, COVID-19 PCR checked was initiated, although result not available prior to the patient leaving the hospital AMA.  Additionally, presenting EKG demonstrated sinus tachycardia with nonspecific less than 1 mm ST elevation V2/V3, likely as a consequence of metabolic derangements stemming from presenting DKA.    Plan: 2 L LR bolus was ordered, with plan to subsequently transition to LR running at 125 cc/h .DKA protocol initiated. Insulin drip per protocol. Q1 hour Accuchecks. Q4H BMP's in order to monitor ensuing anion gap, serum sodium, serum potassium. NPO.  Once serum potassium was less than 5.0, planned to add KCl 20 mEq/L to existing IVF's. Once Q1 hour accuchecks reflect that serum glucose is less than 250, will add D5 to the existing IVF's. Check Q4 hour serum magnesium and phosphorus levels, with prn supplementation per protocol. Once anion gap has closed and patient tolerating PO, will reinitiate basal insulin while continuing insulin drip for an additional two hours to prevent rebound hyperglycemia, before ultimately turning off the insulin drip. Until then, holding home insulin. Monitor on telemetry. Monitor strict I's and O's.  Check A1c.  Plan included emphasized to patient the importance of improved compliance with home insulin regimen as well as importance  of avoidance of recreational drugs, especially the stimulant class as they have a tendency to increase serum blood sugar by operating  through stress hormone induced pathways via dopamine and norepinephrine.  Plan included checking chest x-ray as well as urinalysis to evaluate for underlying infectious process.  And monitor for results of COVID-19 PCR screen.     #) Methamphetamine abuse: Documented history of such, with the patient reportedly to the emergency department physician this evening that he had taken a stimulant recreational drug at a party earlier this evening.  Is behavior initially in the ED as well as the ensuing urinary drug screen was found a positive for feta means, Porcha Deblanc consistent with this methamphetamine abuse.   Plan: Plan had been to consult the transition of care team.  Additionally, splenial counseling the patient on the importance of eliminating recreational drug use including methamphetamine abuse particularly with his underlying history of type 1 diabetes and recurrent episodes of DKA.  However, stated above, the patient left the hospital AMA prior to me being able to evaluate him and have these discussions with him.      #) Hyperkalemia: Pseudohyperkalemia in the setting of DKA, with Bolick acidosis associated extracellular shift of serum potassium.   Plan: Plan to address presenting hyperkalemia was a component of management of his presenting DKA, including aggressive IV fluids while managing his associated metabolic acidosis via initiation of insulin drip, which would result in a intracellular shift in his serum potassium level, correcting this additional pseudohyperkalemia value, and ultimately warranting addition of D5 to existing IV fluids as to prevent hypokalemia given plan for continuation of insulin drip, which otherwise would continue to shift serum potassium intracellularly.  Monitor on telemetry.  Monitor continuous pulse oximetry.      #) Acute kidney  injury: Presenting serum creatinine noted to be 1.12 relative to most recent prior value of 0.68 when checked on 11/12/2020.  Suspect that this interval increase in serum creatinine was prerenal in nature on the basis of multifactorial contributions, including dehydration stemming from auto diuresis as a consequence of his hyperglycemia as well prerenal implications of suspected recent methamphetamine abuse, as further described above.  Urinalysis showed no white blood cells, no red blood cells, and was notable for elevated specific gravity, consistent with suspected dehydration, while showing evidence of underlying urinary tract infection.  Plan aggressive IV fluids per management of DKA, as outlined above, including initial 2 L LR bolus followed by continuous lactated Ringer's at 125 cc/h.  Plan included adding on radiograms home as well as random urine creatinine.  With repeat CMP anticipated for the morning.  Plan also included monitoring strict I's and O's and daily weights and attempting to avoid nephrotoxic agents.       #) Acute transaminitis: Elevated transaminases were noted per presenting labs, without overt laboratory evidence of cholestasis.  Presentation and these laboratory findings were suggestive of hepatic toxic implications of methamphetamine abuse.   Plan: Plan included adding on direct bilirubin to further qualify conjugated versus unconjugated make-up of mildly elevated total bilirubin.  Repeat CMP in the morning.  Right upper quadrant ultrasound to assess gallbladder/hepatic anatomy, including evaluating common bile duct including evaluating for common bile duct dilation or choledocholithiasis.  Check INR.  Again, these outlined measures for further evaluation management of the above presenting medical issues did not get to take place as the patient left the hospital AMA within 20 the emergency department page for admission.       DVT prophylaxis: scd's  Code Status: Full  code Family Communication: none Disposition Plan: Per Rounding Team Consults called: none  Admission status: obs;  step-down unit     Of note, this patient was added by me to the following Admit List/Treatment Team: [email or web address].      PLEASE NOTE THAT DRAGON DICTATION SOFTWARE WAS USED IN THE CONSTRUCTION OF THIS NOTE.   [place] [facility] Pager [phone] From [place]  Otherwise, please contact night-coverage  [email or web address] Password [facility]   12/10/2020, 11:54 PM

## 2020-12-11 ENCOUNTER — Encounter (HOSPITAL_COMMUNITY): Payer: Self-pay | Admitting: Internal Medicine

## 2020-12-11 DIAGNOSIS — Z720 Tobacco use: Secondary | ICD-10-CM | POA: Diagnosis present

## 2020-12-11 DIAGNOSIS — F1510 Other stimulant abuse, uncomplicated: Secondary | ICD-10-CM | POA: Diagnosis present

## 2020-12-11 DIAGNOSIS — F1520 Other stimulant dependence, uncomplicated: Secondary | ICD-10-CM | POA: Diagnosis present

## 2020-12-11 LAB — BLOOD GAS, VENOUS
Acid-base deficit: 17.4 mmol/L — ABNORMAL HIGH (ref 0.0–2.0)
Bicarbonate: 8.7 mmol/L — ABNORMAL LOW (ref 20.0–28.0)
O2 Saturation: 87.4 %
Patient temperature: 98.6
pCO2, Ven: 21.5 mmHg — ABNORMAL LOW (ref 44.0–60.0)
pH, Ven: 7.232 — ABNORMAL LOW (ref 7.250–7.430)
pO2, Ven: 62.8 mmHg — ABNORMAL HIGH (ref 32.0–45.0)

## 2020-12-11 LAB — BETA-HYDROXYBUTYRIC ACID: Beta-Hydroxybutyric Acid: >8 mmol/L — ABNORMAL HIGH (ref 0.05–0.27)

## 2020-12-11 LAB — TROPONIN I (HIGH SENSITIVITY): Troponin I (High Sensitivity): 2 ng/L (ref <18)

## 2020-12-11 MED ORDER — KCL IN DEXTROSE-NACL 10-5-0.45 MEQ/L-%-% IV SOLN
INTRAVENOUS | Status: DC
  Filled 2020-12-11: qty 1000

## 2020-12-11 MED ORDER — SODIUM CHLORIDE 0.45 % IV SOLN
INTRAVENOUS | Status: DC
  Filled 2020-12-11: qty 1000

## 2020-12-11 NOTE — ED Notes (Signed)
Pt called out wanting to leave AMA again, stated MD did not tell him he was being admitted and stated to writer "I know my rights, I know I have DKA.  I have had DKA before and I know what can happen, but I do not want to stay."

## 2020-12-11 NOTE — Discharge Summary (Signed)
Physician Discharge Summary  [name] EmeryHector M Wolfert Jr. ZOX:[id number] DOB: 2000/08/21 DOA: 12/10/2020  PCP: Patient, No Pcp Per (Inactive)  Of note, the patient left the emergency department AMA before I had the opportunity to see him.  Exactly 20 minutes elapsed from the time I received an admission page request from the emergency department physician until I was notified that the patient was leaving AMA.     Admit date: 12/10/2020 Discharge date: 12/11/2020 (please note this this patient was not formally discharged from the hospital but rather left the hospital AMA).   Admitted From: home  Disposition:  patient left the hospital AMA   Recommendations for Outpatient Follow-up: the patient left the hospital AMA before outpatient follow-up could be arranged.    Home Health:No (patient left AMA)  Equipment/Devices:(none: the patient left the hospital AMA)   Discharge Condition:guarded  CODE STATUS:Full  Diet recommendation:  (unable to provide the patient with discharge dietary recommendations as he left the hospital AMA before I had the opportunity to evaluate and interact with him)   Brief/Interim Summary:  [name] EmeryHector M Tuohy Jr. is a 20 y.o. male with medical history significant for type 1 diabetes mellitus with recurrent DKA, methamphetamine abuse, chronic tobacco abuse, who is [facility] on 12/10/2020 with DKA after [facility] ED for evaluation of confusion after reportedly taking meth at a party.    Of note, the patient left the emergency department AMA before I had the opportunity to see him.  Exactly 20 minutes elapsed from the time I received an admission page request from the emergency department physician until I was notified that the patient was leaving AMA.  As a consequence, the following history was obtained via my discussions with the emergency department physician who was able to evaluate the patient multiple times prior to the patient leaving AMA  from the ED.    The patient was initially noted to be of altered mental status, noted to be staring at the wall staring at electrical cords, not initially responding to questions posed to him.  Shortly after presenting to the ED, his mental status reportedly began to improve at which time he was able to convey that he was participating in some recreational drug use daily this evening before taking a stimulant medication that ultimately rendered him altered, prompting him to be brought lexical emergency department for further evaluation.  Patient reportedly did not confirm the specific nature of the recreational drugs that he took at the party, although chart review reveals that the patient has a history of recurrent methamphetamine abuse.  As his mental status improved and he was noted to be alert and oriented x4, the patient denied any acute complaints, and acknowledged his underlying DKA that was identified in the emergency department this evening, as further outlined below.    Per chart review, the patient has a documented history of leaving the hospital AMA on multiple prior occasions.  In anticipation of this possibility, the emergency department physician, Dr. Rubin PayorPickering, discussed with the patient the indications for remaining in the hospital for further evaluation and management of his DKA, including discussing with the patient the risks of leaving the hospital AMA, particularly in the setting of active DKA with hyperkalemia, noting to the patient that these risks included the possibility of death.  Per these discussions with Dr. Rubin PayorPickering, the patient acknowledged his presenting DKA and conveyed his understanding of the risks of leaving the hospital AMA, including increased risk of death  as a potential consequence of leaving AMA.  At the conclusion of these conversations, the patient conveyed to Dr. Rubin Payor that he was amenable to staying in the hospital for further evaluation and management of his  presenting DKA, at which time Dr. Rubin Payor contacted me for admission to the hospitalist service for further evaluation of management of DKA.    However, within 20 minutes of receiving Dr. [name] Calix admission page and accepting the patient for overnight observation the Hospitalist service for further evaluation and management of dka, I was notified by nursing staff that the patient was leaving AMA.  He ultimately left the hospital before I had the opportunity to see him.        ED Course:  Vital signs in the ED were notable for the following: Temp max 98.7, heart rate 130s to 140s; blood pressure 140/96; respiratory rate 26-30; oxygen saturation 97% on room air.   Labs were notable for the following: Point-of-care glucose 484.  VBG 7.232/21.5.  Beta hydroxybutyric acid greater than 8.  CMP was notable for the following: Sodium 131, with correction of approximately 137 when taking into account concomitant hyperglycemia, potassium 5.8, bicarbonate 8, anion gap 27, BUN 15, creatinine 1.12 relative to most recent prior creatinine data point of 0.68 on 11/12/2020, alkaline phosphatase 166, AST 802, ALT 792, total bilirubin 3.1.  High-sensitivity troponin I x1 was found to be 2.  CBC was notable for the following: White blood cell count of 9700, hemoglobin 16.8.  Urinalysis showed no white blood cells, no bacteria, nitrate negative, Kasie Estrace negative, specific gravity 1.021 and showed 80 ketones.  Serum ethanol less than 10.  Urinary drug screen is positive for amphetamines.  Nasopharyngeal COVID-19 PCR was checked in the ED this evening, with result currently pending.   EKG showed sinus tachycardia with heart rate 148, normal intervals, nonspecific T wave inversion in lead III, and less than 1 mm ST elevation in V2, V3, without any additional evidence of ST elevation.   Establishing vascular access in the ED was reportedly challenging due to the patient possessing poor peripheral access and ultimately  the IV team was consulted to assist with this process.  However, the patient left AMA prior to having vascular access established, and therefore he did not receive any IV fluids ,  IV insulin, or any other intravenous medications during his ED course prior to leaving AMA.    As noted above, the patient left the emergency department AMA prior to me being able to evaluate him.  However, initial problem list and associated plan for additional evaluation and management of these items is described below.  Of note, this management and additional treatment did not occur, as the patient left the emergency department AMA prior to execution of these measures.        #) Diabetic ketoacidosis: In the setting of a known history of poor controlled type 1 diabetes , with prior hospitalizations for DKA, on home insulin in the form of Lantus 30 units subcu twice daily as well as NovoLog sliding scale insulin 3 times daily with meals, dx of dka on the basis of presenting blood sugar of of 484 associated with a presenting ABG/VBG demonstrating evidence of metabolic acidosis while presenting CMP consistent with an anion gap metabolic acidosis with serum bicarbonate of 8 and anion gap of 27, and beta hydroxybutyric acid elevated at greater than 8. Additional presenting labs notable for corrected serum sodium of 137 and serum potassium of 5.8.   Suspect contribution  to presenting DKA from suboptimal compliance with outpatient insulin regimen, as well as reported recreational drug use, suspected to be methamphetamine given the patient's initial presentation as well as ensuing results of urinary drug screen positive for amphetamine. No evidence at this time to suggest underlying precipitating infectious etiology, although we were unable to check urinalysis or chest x-ray to further evaluate this possibility as the patient left AMA.  Additionally, COVID-19 PCR checked was initiated, although result not available prior to the  patient leaving the hospital AMA.   Additionally, presenting EKG demonstrated sinus tachycardia with nonspecific less than 1 mm ST elevation V2/V3, likely as a consequence of metabolic derangements stemming from presenting DKA.      Plan: 2 L LR bolus was ordered, with plan to subsequently transition to LR running at 125 cc/h .DKA protocol initiated. Insulin drip per protocol. Q1 hour Accuchecks. Q4H BMP's in order to monitor ensuing anion gap, serum sodium, serum potassium. NPO.  Once serum potassium was less than 5.0, planned to add KCl 20 mEq/L to existing IVF's. Once Q1 hour accuchecks reflect that serum glucose is less than 250, will add D5 to the existing IVF's. Check Q4 hour serum magnesium and phosphorus levels, with prn supplementation per protocol. Once anion gap has closed and patient tolerating PO, will reinitiate basal insulin while continuing insulin drip for an additional two hours to prevent rebound hyperglycemia, before ultimately turning off the insulin drip. Until then, holding home insulin. Monitor on telemetry. Monitor strict I's and O's.  Check A1c.  Plan included emphasized to patient the importance of improved compliance with home insulin regimen as well as importance of avoidance of recreational drugs, especially the stimulant class as they have a tendency to increase serum blood sugar by operating through stress hormone induced pathways via dopamine and norepinephrine.  Plan included checking chest x-ray as well as urinalysis to evaluate for underlying infectious process.  And monitor for results of COVID-19 PCR screen.         #) Methamphetamine abuse: Documented history of such, with the patient reportedly to the emergency department physician this evening that he had taken a stimulant recreational drug at a party earlier this evening.  Is behavior initially in the ED as well as the ensuing urinary drug screen was found a positive for feta means, Ola Fawver consistent with this  methamphetamine abuse.    Plan: Plan had been to consult the transition of care team.  Additionally, splenial counseling the patient on the importance of eliminating recreational drug use including methamphetamine abuse particularly with his underlying history of type 1 diabetes and recurrent episodes of DKA.  However, stated above, the patient left the hospital AMA prior to me being able to evaluate him and have these discussions with him.           #) Hyperkalemia: Pseudohyperkalemia in the setting of DKA, with Bolick acidosis associated extracellular shift of serum potassium.    Plan: Plan to address presenting hyperkalemia was a component of management of his presenting DKA, including aggressive IV fluids while managing his associated metabolic acidosis via initiation of insulin drip, which would result in a intracellular shift in his serum potassium level, correcting this additional pseudohyperkalemia value, and ultimately warranting addition of D5 to existing IV fluids as to prevent hypokalemia given plan for continuation of insulin drip, which otherwise would continue to shift serum potassium intracellularly.  Monitor on telemetry.  Monitor continuous pulse oximetry.           #)  Acute kidney injury: Presenting serum creatinine noted to be 1.12 relative to most recent prior value of 0.68 when checked on 11/12/2020.  Suspect that this interval increase in serum creatinine was prerenal in nature on the basis of multifactorial contributions, including dehydration stemming from auto diuresis as a consequence of his hyperglycemia as well prerenal implications of suspected recent methamphetamine abuse, as further described above.  Urinalysis showed no white blood cells, no red blood cells, and was notable for elevated specific gravity, consistent with suspected dehydration, while showing evidence of underlying urinary tract infection.   Plan aggressive IV fluids per management of DKA, as outlined  above, including initial 2 L LR bolus followed by continuous lactated Ringer's at 125 cc/h.  Plan included adding on radiograms home as well as random urine creatinine.  With repeat CMP anticipated for the morning.  Plan also included monitoring strict I's and O's and daily weights and attempting to avoid nephrotoxic agents.             #) Acute transaminitis: Elevated transaminases were noted per presenting labs, without overt laboratory evidence of cholestasis.  Presentation and these laboratory findings were suggestive of hepatic toxic implications of methamphetamine abuse.    Plan: Plan included adding on direct bilirubin to further qualify conjugated versus unconjugated make-up of mildly elevated total bilirubin.  Repeat CMP in the morning.  Right upper quadrant ultrasound to assess gallbladder/hepatic anatomy, including evaluating common bile duct including evaluating for common bile duct dilation or choledocholithiasis.  Check INR.  Again, these outlined measures for further evaluation management of the above presenting medical issues did not get to take place as the patient left the hospital AMA within 20 the emergency department page for admission.       Discharge Diagnoses:  Principal Problem:   DKA (diabetic ketoacidosis) (HCC) Active Problems:   AKI (acute kidney injury) (HCC)   Hyperkalemia   Transaminitis   Methamphetamine abuse (HCC)   Tobacco abuse   Discharge Instructions: unable to provide patient with formal discharge instructions as he left the hospital AMA within 20 minutes of being accepted for admission.     Allergies  Allergen Reactions   Bee Venom Anaphylaxis   Fish Allergy Anaphylaxis   Shellfish Allergy Anaphylaxis   Tea Anaphylaxis    Consultations: (None; patient left AMA prior to the execution of any subspeciality consultations).    Subjective:   Discharge Exam: Vitals:   12/10/20 2230 12/10/20 2300  BP: (!) 162/106 (!) 140/96  Pulse:  (!) 144 (!) 137  Resp: (!) 26 (!) 31  Temp:    SpO2: 97% 97%   Vitals:   12/10/20 2123 12/10/20 2129 12/10/20 2230 12/10/20 2300  BP:   (!) 162/106 (!) 140/96  Pulse:   (!) 144 (!) 137  Resp: (!) 30  (!) 26 (!) 31  Temp:      TempSrc:      SpO2:   97% 97%  Weight:  88.5 kg    Height:  5\' 10"  (1.778 m)        The results of significant diagnostics from this hospitalization (including imaging, microbiology, ancillary and laboratory) are listed below for reference.     Microbiology: No results found for this or any previous visit (from the past 240 hour(s)).   Labs: BNP (last 3 results) No results for input(s): BNP in the last 8760 hours. Basic Metabolic Panel: Recent Labs  Lab 12/10/20 2130  NA 131*  K 5.8*  CL 96*  CO2  8*  GLUCOSE 496*  BUN 15  CREATININE 1.12  CALCIUM 8.8*   Liver Function Tests: Recent Labs  Lab 12/10/20 2130  AST 802*  ALT 792*  ALKPHOS 166*  BILITOT 3.1*  PROT 8.1  ALBUMIN 4.3   No results for input(s): LIPASE, AMYLASE in the last 168 hours. No results for input(s): AMMONIA in the last 168 hours. CBC: Recent Labs  Lab 12/10/20 2130  WBC 9.7  NEUTROABS 7.9*  HGB 16.8  HCT 50.5  MCV 83.6  PLT 267   Cardiac Enzymes: No results for input(s): CKTOTAL, CKMB, CKMBINDEX, TROPONINI in the last 168 hours. BNP: Invalid input(s): POCBNP CBG: Recent Labs  Lab 12/10/20 2130  GLUCAP 484*   D-Dimer No results for input(s): DDIMER in the last 72 hours. Hgb A1c No results for input(s): HGBA1C in the last 72 hours. Lipid Profile No results for input(s): CHOL, HDL, LDLCALC, TRIG, CHOLHDL, LDLDIRECT in the last 72 hours. Thyroid function studies No results for input(s): TSH, T4TOTAL, T3FREE, THYROIDAB in the last 72 hours.  Invalid input(s): FREET3 Anemia work up No results for input(s): VITAMINB12, FOLATE, FERRITIN, TIBC, IRON, RETICCTPCT in the last 72 hours. Urinalysis    Component Value Date/Time   COLORURINE STRAW (A)  12/10/2020 2131   APPEARANCEUR CLEAR 12/10/2020 2131   LABSPEC 1.021 12/10/2020 2131   PHURINE 5.0 12/10/2020 2131   GLUCOSEU >=500 (A) 12/10/2020 2131   HGBUR NEGATIVE 12/10/2020 2131   BILIRUBINUR NEGATIVE 12/10/2020 2131   KETONESUR 80 (A) 12/10/2020 2131   PROTEINUR NEGATIVE 12/10/2020 2131   UROBILINOGEN 0.2 09/18/2019 1339   NITRITE NEGATIVE 12/10/2020 2131   LEUKOCYTESUR NEGATIVE 12/10/2020 2131   Sepsis Labs Invalid input(s): PROCALCITONIN,  WBC,  LACTICIDVEN Microbiology No results found for this or any previous visit (from the past 240 hour(s)).   Time coordinating discharge: under 30 minutes  SIGNED:   Angie Fava, DO  [facility] 12/11/2020, 12:14 AM Pager

## 2020-12-11 NOTE — ED Notes (Signed)
Dr.Howerter made aware pt wanting to leave AMA.  Pt a&ox4 currently, lucid, mentation normal.  Pt signed AMA form via electronic signature.

## 2020-12-27 DIAGNOSIS — M791 Myalgia, unspecified site: Secondary | ICD-10-CM | POA: Insufficient documentation

## 2020-12-27 DIAGNOSIS — E781 Pure hyperglyceridemia: Secondary | ICD-10-CM | POA: Insufficient documentation

## 2021-02-10 ENCOUNTER — Emergency Department: Admit: 2021-02-10 | Payer: Self-pay

## 2021-02-10 ENCOUNTER — Other Ambulatory Visit: Payer: Self-pay

## 2021-02-10 ENCOUNTER — Emergency Department (INDEPENDENT_AMBULATORY_CARE_PROVIDER_SITE_OTHER)
Admission: EM | Admit: 2021-02-10 | Discharge: 2021-02-10 | Disposition: A | Discharge status: Home / Self Care | Payer: Medicaid Other | Source: Home / Self Care

## 2021-02-10 ENCOUNTER — Encounter: Payer: Self-pay | Admitting: Emergency Medicine

## 2021-02-10 DIAGNOSIS — E10638 Type 1 diabetes mellitus with other oral complications: Secondary | ICD-10-CM | POA: Diagnosis not present

## 2021-02-10 DIAGNOSIS — K047 Periapical abscess without sinus: Secondary | ICD-10-CM | POA: Diagnosis not present

## 2021-02-10 DIAGNOSIS — R739 Hyperglycemia, unspecified: Secondary | ICD-10-CM

## 2021-02-10 LAB — POCT FASTING CBG KUC MANUAL ENTRY: POCT Glucose (KUC): 579 mg/dL — AB (ref 70–99)

## 2021-02-10 MED ORDER — AMOXICILLIN-POT CLAVULANATE 875-125 MG PO TABS: 1.0000 | ORAL_TABLET | Freq: Two times a day (BID) | ORAL | 0 refills | Status: AC

## 2021-02-10 NOTE — ED Notes (Signed)
Patient is being discharged from the Urgent Care and sent to the Emergency Department via POV w/father-in-law . Per M.Ragan, FNP, patient is in need of higher level of care due to elevated CBG (579), dental abcess, type I diabetes & HTN. Patient is aware and verbalizes understanding of plan of care.  Vitals:   02/10/21 1314  BP: (!) 145/101  Pulse: (!) 104  Resp: 18  Temp: 98.2 F (36.8 C)  SpO2: 97%   Pt instructed to go to [facility] ED. Pt's father in law is picking pt up. Pt states he does not know which ED he will go to at this time.

## 2021-02-10 NOTE — Discharge Instructions (Addendum)
Advised patient to take medication as directed with food to completion.  Advised patient to go to [facility] now for immediate evaluation of hyperglycemia.  Advised patient to contact dentist below for further evaluation:  Cuthrell, DDS and Mochnick, DMD [phone]

## 2021-02-10 NOTE — ED Notes (Signed)
Critical CBG (579) results to M.Ragan, FNP - read back & verbalized. Per pt he waits until he runs out of insulin and then goes to the hospital. Pt does not have a PCP that he sees routinely.

## 2021-02-10 NOTE — ED Triage Notes (Addendum)
Dental x 1 week  OTC - orajel, ibuprofen - no relief  Does not have a PCP Pt unable to find a dentist who accepts his insurance  Pt has gum swelling to R lower gums Denies fever Pt is a type I diabetic - Glucometer at home read high at 1030 am - meter goes up to 600 - covered w/ insulin - has not rechecked No COVID vaccine
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
## 2021-03-13 ENCOUNTER — Other Ambulatory Visit: Payer: Self-pay

## 2021-03-13 ENCOUNTER — Emergency Department (HOSPITAL_COMMUNITY): Payer: Medicaid Other

## 2021-03-13 ENCOUNTER — Encounter (HOSPITAL_COMMUNITY): Payer: Self-pay | Admitting: Oncology

## 2021-03-13 ENCOUNTER — Emergency Department (HOSPITAL_COMMUNITY)
Admission: EM | Admit: 2021-03-13 | Discharge: 2021-03-13 | Disposition: A | Discharge status: Home / Self Care | Payer: Medicaid Other | Attending: Emergency Medicine | Admitting: Emergency Medicine

## 2021-03-13 DIAGNOSIS — R739 Hyperglycemia, unspecified: Secondary | ICD-10-CM

## 2021-03-13 DIAGNOSIS — R1011 Right upper quadrant pain: Secondary | ICD-10-CM | POA: Diagnosis not present

## 2021-03-13 DIAGNOSIS — Z794 Long term (current) use of insulin: Secondary | ICD-10-CM | POA: Insufficient documentation

## 2021-03-13 DIAGNOSIS — Z87891 Personal history of nicotine dependence: Secondary | ICD-10-CM | POA: Insufficient documentation

## 2021-03-13 DIAGNOSIS — J45909 Unspecified asthma, uncomplicated: Secondary | ICD-10-CM | POA: Diagnosis not present

## 2021-03-13 DIAGNOSIS — E1065 Type 1 diabetes mellitus with hyperglycemia: Secondary | ICD-10-CM | POA: Insufficient documentation

## 2021-03-13 DIAGNOSIS — R0789 Other chest pain: Secondary | ICD-10-CM | POA: Diagnosis not present

## 2021-03-13 LAB — CBC WITH DIFFERENTIAL/PLATELET
Abs Immature Granulocytes: 0.07 10*3/uL (ref 0.00–0.07)
Basophils Absolute: 0.1 10*3/uL (ref 0.0–0.1)
Basophils Relative: 1 %
Eosinophils Absolute: 0 10*3/uL (ref 0.0–0.5)
Eosinophils Relative: 1 %
HCT: 42.8 % (ref 39.0–52.0)
Hemoglobin: 14.5 g/dL (ref 13.0–17.0)
Immature Granulocytes: 1 %
Lymphocytes Relative: 31 %
Lymphs Abs: 2.2 10*3/uL (ref 0.7–4.0)
MCH: 27.7 pg (ref 26.0–34.0)
MCHC: 33.9 g/dL (ref 30.0–36.0)
MCV: 81.7 fL (ref 80.0–100.0)
Monocytes Absolute: 0.4 10*3/uL (ref 0.1–1.0)
Monocytes Relative: 5 %
Neutro Abs: 4.4 10*3/uL (ref 1.7–7.7)
Neutrophils Relative %: 61 %
Platelets: 241 10*3/uL (ref 150–400)
RBC: 5.24 MIL/uL (ref 4.22–5.81)
RDW: 11.9 % (ref 11.5–15.5)
WBC: 7.1 10*3/uL (ref 4.0–10.5)
nRBC: 0 % (ref 0.0–0.2)

## 2021-03-13 LAB — COMPREHENSIVE METABOLIC PANEL
ALT: 86 U/L — ABNORMAL HIGH (ref 0–44)
AST: 80 U/L — ABNORMAL HIGH (ref 15–41)
Albumin: 3.6 g/dL (ref 3.5–5.0)
Alkaline Phosphatase: 117 U/L (ref 38–126)
Anion gap: 13 (ref 5–15)
BUN: 11 mg/dL (ref 6–20)
CO2: 21 mmol/L — ABNORMAL LOW (ref 22–32)
Calcium: 8.7 mg/dL — ABNORMAL LOW (ref 8.9–10.3)
Chloride: 96 mmol/L — ABNORMAL LOW (ref 98–111)
Creatinine, Ser: 0.89 mg/dL (ref 0.61–1.24)
GFR, Estimated: >60 mL/min (ref >60)
Glucose, Bld: 543 mg/dL (ref 70–99)
Potassium: 4.1 mmol/L (ref 3.5–5.1)
Sodium: 130 mmol/L — ABNORMAL LOW (ref 135–145)
Total Bilirubin: 0.8 mg/dL (ref 0.3–1.2)
Total Protein: 6.9 g/dL (ref 6.5–8.1)

## 2021-03-13 LAB — TROPONIN I (HIGH SENSITIVITY)
Troponin I (High Sensitivity): <2 ng/L (ref <18)
Troponin I (High Sensitivity): <2 ng/L (ref <18)

## 2021-03-13 LAB — CBG MONITORING, ED
Glucose-Capillary: 550 mg/dL (ref 70–99)
Glucose-Capillary: 299 mg/dL — ABNORMAL HIGH (ref 70–99)

## 2021-03-13 LAB — LIPASE, BLOOD: Lipase: 27 U/L (ref 11–51)

## 2021-03-13 MED ORDER — IOHEXOL 350 MG/ML SOLN
100.0000 mL | Freq: Once | INTRAVENOUS | Status: AC | PRN
  Administered 2021-03-13: 100 mL via INTRAVENOUS

## 2021-03-13 MED ORDER — ONDANSETRON HCL 4 MG/2ML IJ SOLN
4.0000 mg | Freq: Once | INTRAMUSCULAR | Status: AC
  Administered 2021-03-13: 4 mg via INTRAVENOUS
  Filled 2021-03-13: qty 2

## 2021-03-13 MED ORDER — SODIUM CHLORIDE 0.9 % IV BOLUS
1000.0000 mL | Freq: Once | INTRAVENOUS | Status: AC
  Administered 2021-03-13: 1000 mL via INTRAVENOUS

## 2021-03-13 MED ORDER — HYDROMORPHONE HCL 1 MG/ML IJ SOLN
1.0000 mg | Freq: Once | INTRAMUSCULAR | Status: AC
  Administered 2021-03-13: 1 mg via INTRAVENOUS
  Filled 2021-03-13: qty 1

## 2021-03-13 MED ORDER — SODIUM CHLORIDE 0.9 % IV SOLN: INTRAVENOUS | Status: DC

## 2021-03-13 MED ORDER — INSULIN ASPART 100 UNIT/ML IJ SOLN
10.0000 [IU] | Freq: Once | INTRAMUSCULAR | Status: AC
  Administered 2021-03-13: 10 [IU] via INTRAVENOUS
  Filled 2021-03-13: qty 0.1

## 2021-03-13 NOTE — ED Triage Notes (Signed)
Pt presents d/t CP x 2 weeks today pain became severe.  Pt reports multiple medical issues d/t inability to get to doctor appointments.  Pt is 1 month clean from methamphetamines.

## 2021-03-13 NOTE — Discharge Instructions (Addendum)
Continue to follow your blood sugars carefully at home.  Follow-up with your providers.  Return for any new or worse symptoms.

## 2021-03-13 NOTE — ED Notes (Signed)
Pt ambulatory to bathroom
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
## 2021-03-13 NOTE — ED Notes (Signed)
Patient transported to CT 

## 2021-03-20 ENCOUNTER — Ambulatory Visit: Payer: Self-pay

## 2021-03-27 ENCOUNTER — Emergency Department (INDEPENDENT_AMBULATORY_CARE_PROVIDER_SITE_OTHER)
Admission: RE | Admit: 2021-03-27 | Discharge: 2021-03-27 | Disposition: A | Discharge status: Home / Self Care | Payer: Medicaid Other | Source: Ambulatory Visit

## 2021-03-27 ENCOUNTER — Telehealth: Payer: Self-pay | Admitting: Emergency Medicine

## 2021-03-27 ENCOUNTER — Other Ambulatory Visit: Payer: Self-pay

## 2021-03-27 VITALS — BP 148/99 | HR 126 | Temp 99.1°F | Resp 16 | Ht 70.0 in

## 2021-03-27 DIAGNOSIS — J029 Acute pharyngitis, unspecified: Secondary | ICD-10-CM

## 2021-03-27 LAB — POCT RAPID STREP A (OFFICE): Rapid Strep A Screen: NEGATIVE
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
## 2021-03-27 NOTE — ED Triage Notes (Signed)
C/o throat not looking right  Throat is red  Pt's brother overdosed this morning at 0500  Pt was with him at the time  Pt has used meth IV at 0500 & 1200 today  Pt had an intake appointment at  [facility] at [address] today in [place] Pt is here w/ his wife (unaware of pt's meth usage)

## 2021-03-27 NOTE — ED Notes (Signed)
Pt states his CBG was 130 at 1pm

## 2021-03-27 NOTE — Telephone Encounter (Signed)
Call to [facility] to check on reason for visit - based on pt history he may need to go to ED - # left for call back

## 2021-03-27 NOTE — Discharge Instructions (Addendum)
Advised patient we will follow-up with throat culture results once received.  Advised patient may take OTC Ibuprofen 800 mg 1-2 times daily for sore throat pain.  Encouraged patient to increase daily water intake while taking this medication.

## 2021-03-31 LAB — CULTURE, GROUP A STREP: Strep A Culture: NEGATIVE

## 2021-04-06 ENCOUNTER — Emergency Department (HOSPITAL_COMMUNITY)
Admission: EM | Admit: 2021-04-06 | Discharge: 2021-04-06 | Discharge status: Against Medical Advice | Payer: No Typology Code available for payment source

## 2021-04-06 NOTE — ED Notes (Signed)
EMS was instructed to take pt to lobby on arrival.  Report received on pt.  Unable to locate him in ED lobby after multiple attempts.

## 2021-06-02 DIAGNOSIS — K859 Acute pancreatitis without necrosis or infection, unspecified: Secondary | ICD-10-CM | POA: Insufficient documentation

## 2021-06-17 DIAGNOSIS — R443 Hallucinations, unspecified: Secondary | ICD-10-CM | POA: Insufficient documentation

## 2021-06-17 DIAGNOSIS — K5000 Crohn's disease of small intestine without complications: Secondary | ICD-10-CM | POA: Diagnosis present

## 2021-07-19 DIAGNOSIS — B182 Chronic viral hepatitis C: Secondary | ICD-10-CM | POA: Insufficient documentation

## 2021-07-19 DIAGNOSIS — R339 Retention of urine, unspecified: Secondary | ICD-10-CM | POA: Insufficient documentation

## 2021-07-19 DIAGNOSIS — K759 Inflammatory liver disease, unspecified: Secondary | ICD-10-CM | POA: Insufficient documentation

## 2021-08-05 ENCOUNTER — Encounter (HOSPITAL_COMMUNITY): Payer: Self-pay

## 2021-08-05 ENCOUNTER — Other Ambulatory Visit: Payer: Self-pay

## 2021-08-05 ENCOUNTER — Emergency Department (HOSPITAL_COMMUNITY)
Admission: EM | Admit: 2021-08-05 | Discharge: 2021-08-06 | Disposition: A | Discharge status: Home / Self Care | Payer: Medicaid Other | Attending: Emergency Medicine | Admitting: Emergency Medicine

## 2021-08-05 ENCOUNTER — Ambulatory Visit (HOSPITAL_COMMUNITY)
Admission: RE | Admit: 2021-08-05 | Discharge: 2021-08-05 | Disposition: A | Discharge status: Home / Self Care | Payer: Medicaid Other | Attending: Psychiatry | Admitting: Psychiatry

## 2021-08-05 DIAGNOSIS — R7401 Elevation of levels of liver transaminase levels: Secondary | ICD-10-CM | POA: Diagnosis not present

## 2021-08-05 DIAGNOSIS — E1065 Type 1 diabetes mellitus with hyperglycemia: Secondary | ICD-10-CM | POA: Diagnosis not present

## 2021-08-05 DIAGNOSIS — R7989 Other specified abnormal findings of blood chemistry: Secondary | ICD-10-CM

## 2021-08-05 DIAGNOSIS — R739 Hyperglycemia, unspecified: Secondary | ICD-10-CM

## 2021-08-05 DIAGNOSIS — Z794 Long term (current) use of insulin: Secondary | ICD-10-CM | POA: Insufficient documentation

## 2021-08-05 DIAGNOSIS — R45851 Suicidal ideations: Secondary | ICD-10-CM | POA: Insufficient documentation

## 2021-08-05 DIAGNOSIS — Z20822 Contact with and (suspected) exposure to covid-19: Secondary | ICD-10-CM | POA: Diagnosis not present

## 2021-08-05 DIAGNOSIS — Z79899 Other long term (current) drug therapy: Secondary | ICD-10-CM | POA: Insufficient documentation

## 2021-08-05 DIAGNOSIS — Z046 Encounter for general psychiatric examination, requested by authority: Secondary | ICD-10-CM | POA: Diagnosis present

## 2021-08-05 LAB — COMPREHENSIVE METABOLIC PANEL
ALT: 593 U/L — ABNORMAL HIGH (ref 0–44)
AST: 570 U/L — ABNORMAL HIGH (ref 15–41)
Albumin: 3.6 g/dL (ref 3.5–5.0)
Alkaline Phosphatase: 243 U/L — ABNORMAL HIGH (ref 38–126)
Anion gap: 15 (ref 5–15)
BUN: 15 mg/dL (ref 6–20)
CO2: 21 mmol/L — ABNORMAL LOW (ref 22–32)
Calcium: 8.6 mg/dL — ABNORMAL LOW (ref 8.9–10.3)
Chloride: 96 mmol/L — ABNORMAL LOW (ref 98–111)
Creatinine, Ser: 0.69 mg/dL (ref 0.61–1.24)
GFR, Estimated: >60 mL/min (ref >60)
Glucose, Bld: 551 mg/dL (ref 70–99)
Potassium: 4.4 mmol/L (ref 3.5–5.1)
Sodium: 132 mmol/L — ABNORMAL LOW (ref 135–145)
Total Bilirubin: 1 mg/dL (ref 0.3–1.2)
Total Protein: 7 g/dL (ref 6.5–8.1)

## 2021-08-05 LAB — CBC
HCT: 40.5 % (ref 39.0–52.0)
Hemoglobin: 14 g/dL (ref 13.0–17.0)
MCH: 29.4 pg (ref 26.0–34.0)
MCHC: 34.6 g/dL (ref 30.0–36.0)
MCV: 85.1 fL (ref 80.0–100.0)
Platelets: 238 10*3/uL (ref 150–400)
RBC: 4.76 MIL/uL (ref 4.22–5.81)
RDW: 12.1 % (ref 11.5–15.5)
WBC: 4.7 10*3/uL (ref 4.0–10.5)
nRBC: 0 % (ref 0.0–0.2)

## 2021-08-05 LAB — ACETAMINOPHEN LEVEL: Acetaminophen (Tylenol), Serum: <10 ug/mL — ABNORMAL LOW (ref 10–30)

## 2021-08-05 LAB — SALICYLATE LEVEL: Salicylate Lvl: <7 mg/dL — ABNORMAL LOW (ref 7.0–30.0)

## 2021-08-05 LAB — ETHANOL: Alcohol, Ethyl (B): <10 mg/dL (ref <10)

## 2021-08-05 MED ORDER — LACTATED RINGERS IV BOLUS
1500.0000 mL | Freq: Once | INTRAVENOUS | Status: AC
  Administered 2021-08-06: 1500 mL via INTRAVENOUS
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
## 2021-08-06 LAB — RAPID URINE DRUG SCREEN, HOSP PERFORMED
Amphetamines: NOT DETECTED
Barbiturates: NOT DETECTED
Benzodiazepines: NOT DETECTED
Cocaine: NOT DETECTED
Opiates: NOT DETECTED
Tetrahydrocannabinol: NOT DETECTED

## 2021-08-06 LAB — CBG MONITORING, ED
Glucose-Capillary: 404 mg/dL — ABNORMAL HIGH (ref 70–99)
Glucose-Capillary: 376 mg/dL — ABNORMAL HIGH (ref 70–99)
Glucose-Capillary: 289 mg/dL — ABNORMAL HIGH (ref 70–99)
Glucose-Capillary: 371 mg/dL — ABNORMAL HIGH (ref 70–99)
Glucose-Capillary: 276 mg/dL — ABNORMAL HIGH (ref 70–99)

## 2021-08-06 LAB — RESP PANEL BY RT-PCR (FLU A&B, COVID) ARPGX2
Influenza A by PCR: NEGATIVE
Influenza B by PCR: NEGATIVE
SARS Coronavirus 2 by RT PCR: NEGATIVE

## 2021-08-06 LAB — BLOOD GAS, VENOUS
Acid-base deficit: 3.5 mmol/L — ABNORMAL HIGH (ref 0.0–2.0)
Bicarbonate: 22.4 mmol/L (ref 20.0–28.0)
O2 Saturation: 65.1 %
Patient temperature: 98.6
pCO2, Ven: 45.8 mmHg (ref 44.0–60.0)
pH, Ven: 7.31 (ref 7.250–7.430)
pO2, Ven: 40.2 mmHg (ref 32.0–45.0)

## 2021-08-06 LAB — URINALYSIS, ROUTINE W REFLEX MICROSCOPIC
Bacteria, UA: NONE SEEN
Bilirubin Urine: NEGATIVE
Glucose, UA: >=500 mg/dL — AB
Hgb urine dipstick: NEGATIVE
Ketones, ur: 20 mg/dL — AB
Leukocytes,Ua: NEGATIVE
Nitrite: NEGATIVE
Protein, ur: NEGATIVE mg/dL
Specific Gravity, Urine: 1.02 (ref 1.005–1.030)
pH: 6 (ref 5.0–8.0)

## 2021-08-06 MED ORDER — ALBUTEROL SULFATE HFA 108 (90 BASE) MCG/ACT IN AERS: 2.0000 | INHALATION_SPRAY | Freq: Four times a day (QID) | RESPIRATORY_TRACT | Status: DC | PRN

## 2021-08-06 MED ORDER — ALBUTEROL SULFATE (2.5 MG/3ML) 0.083% IN NEBU: 2.5000 mg | INHALATION_SOLUTION | Freq: Four times a day (QID) | RESPIRATORY_TRACT | Status: DC | PRN

## 2021-08-06 MED ORDER — INSULIN GLARGINE-YFGN 100 UNIT/ML ~~LOC~~ SOLN
30.0000 [IU] | Freq: Two times a day (BID) | SUBCUTANEOUS | Status: DC
  Administered 2021-08-06: 30 [IU] via SUBCUTANEOUS
  Filled 2021-08-06 (×2): qty 0.3

## 2021-08-06 MED ORDER — INSULIN ASPART 100 UNIT/ML IJ SOLN
0.0000 [IU] | Freq: Three times a day (TID) | INTRAMUSCULAR | Status: DC
  Administered 2021-08-06: 8 [IU] via SUBCUTANEOUS
  Filled 2021-08-06: qty 0.15

## 2021-08-06 MED ORDER — INSULIN ASPART 100 UNIT/ML IJ SOLN
5.0000 [IU] | Freq: Once | INTRAMUSCULAR | Status: AC
  Administered 2021-08-06: 5 [IU] via SUBCUTANEOUS
  Filled 2021-08-06: qty 0.05

## 2021-08-06 NOTE — BH Assessment (Signed)
[facility] Assessment Progress Note   Per Melbourne Abts, PA, this voluntary pt would benefit from psychiatric hospitalization at this time.  However, pt wants to be discharged AMA and  EDP Cathren Laine, MD finds that pt does not meet criteria for IVC.  Discharge instructions include contact information for [facility], the [facility] serving [facility] where pt resides.  Dr Denton Lank and pt's nurse, Ladona Ridgel, have been notified.  Doylene Canning, MA Triage Specialist [phone]

## 2021-08-06 NOTE — Discharge Instructions (Addendum)
It was our pleasure to provide your ER care today - we hope that you feel better.  Return if re-consider, and willing to be admitted, or rreturn right away should symptoms worsen, fevers, new or severe abdominal pain, persistent vomiting, or other concern.   As we discussed, your liver tests are blood sugar are very high - you must follow up closely with primary care doctor for these issues in the next 1-2 days.   As relates blood sugar, drink plenty of water/fluids, follow diabetes meal plan, and continue diabetes meds, monitor glucose 4x/day and record values. Follow up with primary care doctor in 1-2 days.   For your behavioral health needs contact [facility].  They will find a treatment provider for you in your community, and can help you with scheduling an appointment.  Please note that the phone number listed below is also a 24 hour crisis number:       [facility]      [phone]

## 2021-08-06 NOTE — ED Provider Notes (Signed)
[facility] DEPT Provider Note   CSN: [id number] Arrival date & time: 08/05/21  2233     History  Chief Complaint  Patient presents with   Suicidal    [name] [name]. is a 21 y.o. male.  HPI Patient is a 21 year old male with a history of type 1 diabetes mellitus, BPD, bipolar disorder, hepatitis C, polysubstance abuse, schizoaffective disorder, epilepsy, suicidal ideation, who presents to the emergency department due to suicidal ideation.  Patient states that "he lost everything".  States that "he does not want to be here anymore".  States that he is thought about shooting himself.  He attempted to kill himself around 7 PM today by overdosing on medications but his wife was able to get him to spit out the medications.  He states that "he only swallowed a couple".  He is unsure of the medication but believes it was a "depression medication".  Currently denies any complaints besides suicidal ideation.  No chest pain, shortness of breath, abdominal pain, nausea, vomiting, or diarrhea.  States he has not been eating for the past 2 to 3 days so has not been taking his NovoLog.  States he last took his Lantus this morning.  States he does not feel that he is in DKA at this time.  Patient initially evaluated at behavioral health with reports of suicide.  He was recommended for inpatient treatment but due to his possible ingestion was sent to the emergency department for medical clearance.    Home Medications Prior to Admission medications   Medication Sig Start Date End Date Taking? Authorizing Provider  albuterol (VENTOLIN HFA) 108 (90 Base) MCG/ACT inhaler Inhale 2 puffs into the lungs every 6 (six) hours as needed for wheezing or shortness of breath.    [provider]  blood glucose meter kit and supplies KIT Dispense based on patient and insurance preference. Use up to four times daily as directed. (FOR ICD-9 250.00, 250.01). 11/09/20   Florencia Reasons, MD   dicyclomine (BENTYL) 10 MG capsule Take 10 mg by mouth 3 (three) times daily. 01/17/21   [provider]  EPINEPHrine 0.3 mg/0.3 mL IJ SOAJ injection Inject 0.3 mg into the muscle See admin instructions. Inject 0.3 mg into the muscle as directed, then CALL 9-1-1 05/02/20   [provider]  glucose blood (FREESTYLE TEST STRIPS) test strip Use as instructed 11/09/20   Florencia Reasons, MD  insulin aspart (NOVOLOG FLEXPEN) 100 UNIT/ML FlexPen Inject 1-15 Units into the skin 3 (three) times daily with meals. 1 unit/g of carbohydrate sliding scale. Patient taking differently: Inject into the skin See admin instructions. Inject into the skin three times a day with meals, per correction carb correction scale: 1 unit of insulin for every 15 grams of carbohydrates 11/09/20   Florencia Reasons, MD  insulin glargine (LANTUS) 100 UNIT/ML Solostar Pen Inject 30 Units into the skin 2 (two) times daily. Patient taking differently: Inject 35 Units into the skin in the morning and at bedtime. 11/09/20   Florencia Reasons, MD  Insulin Pen Needle (ADVOCATE INSULIN PEN NEEDLES) 29G X 12.7MM MISC For type I diabetes 11/09/20   Florencia Reasons, MD      Allergies    Bee venom, Fish allergy, Shellfish allergy, and Tea    Review of Systems   Review of Systems  All other systems reviewed and are negative. Ten systems reviewed and are negative for acute change, except as noted in the HPI.   Physical Exam Updated  Vital Signs BP 137/84 (BP Location: Left Arm)    Pulse 76    Temp 98.1 F (36.7 C) (Oral)    Resp 16    Ht _0  (1.778 m)    Wt 90.7 kg    SpO2 98%    BMI 28.70 kg/m  Physical Exam Vitals and nursing note reviewed.  Constitutional:      General: He is not in acute distress.    Appearance: Normal appearance. He is not ill-appearing, toxic-appearing or diaphoretic.  HENT:     Head: Normocephalic and atraumatic.     Right Ear: External ear normal.     Left Ear: External ear normal.     Nose: Nose normal.     Mouth/Throat:      Mouth: Mucous membranes are moist.     Pharynx: Oropharynx is clear. No oropharyngeal exudate or posterior oropharyngeal erythema.  Eyes:     General: No scleral icterus.       Right eye: No discharge.        Left eye: No discharge.     Extraocular Movements: Extraocular movements intact.     Conjunctiva/sclera: Conjunctivae normal.  Cardiovascular:     Rate and Rhythm: Normal rate and regular rhythm.     Pulses: Normal pulses.     Heart sounds: Normal heart sounds. No murmur heard.   No friction rub. No gallop.     Comments: RRR without M/R/G. Pulmonary:     Effort: Pulmonary effort is normal. No respiratory distress.     Breath sounds: Normal breath sounds. No stridor. No wheezing, rhonchi or rales.  Abdominal:     General: Abdomen is flat.     Palpations: Abdomen is soft.     Tenderness: There is no abdominal tenderness.     Comments: Abdomen is soft and nontender.  Musculoskeletal:        General: Normal range of motion.     Cervical back: Normal range of motion and neck supple. No tenderness.  Skin:    General: Skin is warm and dry.  Neurological:     General: No focal deficit present.     Mental Status: He is alert and oriented to person, place, and time.  Psychiatric:        Mood and Affect: Mood is depressed. Affect is tearful.        Behavior: Behavior normal.        Thought Content: Thought content includes suicidal ideation. Thought content includes suicidal plan.    ED Results / Procedures / Treatments   Labs (all labs ordered are listed, but only abnormal results are displayed) Labs Reviewed  COMPREHENSIVE METABOLIC PANEL - Abnormal; Notable for the following components:      Result Value   Sodium 132 (*)    Chloride 96 (*)    CO2 21 (*)    Glucose, Bld 551 (*)    Calcium 8.6 (*)    AST 570 (*)    ALT 593 (*)    Alkaline Phosphatase 243 (*)    All other components within normal limits  SALICYLATE LEVEL - Abnormal; Notable for the following  components:   Salicylate Lvl <7.2 (*)    All other components within normal limits  ACETAMINOPHEN LEVEL - Abnormal; Notable for the following components:   Acetaminophen (Tylenol), Serum <10 (*)    All other components within normal limits  BLOOD GAS, VENOUS - Abnormal; Notable for the following components:   Acid-base deficit 3.5 (*)  All other components within normal limits  URINALYSIS, ROUTINE W REFLEX MICROSCOPIC - Abnormal; Notable for the following components:   Color, Urine COLORLESS (*)    Glucose, UA >=500 (*)    Ketones, ur 20 (*)    All other components within normal limits  CBG MONITORING, ED - Abnormal; Notable for the following components:   Glucose-Capillary 404 (*)    All other components within normal limits  CBG MONITORING, ED - Abnormal; Notable for the following components:   Glucose-Capillary 376 (*)    All other components within normal limits  RESP PANEL BY RT-PCR (FLU A&B, COVID) ARPGX2  ETHANOL  CBC  RAPID URINE DRUG SCREEN, HOSP PERFORMED   EKG EKG Interpretation  Date/Time:  Tuesday August 06 2021 01:43:54 EST Ventricular Rate:  76 PR Interval:  146 QRS Duration: 89 QT Interval:  369 QTC Calculation: 415 R Axis:   85 Text Interpretation: Sinus rhythm Nonspecific T abnormalities, diffuse leads Confirmed by Ripley Fraise [id number] on 08/06/2021 1:49:09 AM  Radiology No results found.  Procedures Procedures   Medications Ordered in ED Medications  insulin glargine-yfgn (SEMGLEE) injection 30 Units (has no administration in time range)  albuterol (PROVENTIL) (2.5 MG/3ML) 0.083% nebulizer solution 2.5 mg (has no administration in time range)  lactated ringers bolus 1,500 mL (0 mLs Intravenous Stopped 08/06/21 0246)  insulin aspart (novoLOG) injection 5 Units (5 Units Subcutaneous Given 08/06/21 0151)    ED Course/ Medical Decision Making/ A&P                           Medical Decision Making Amount and/or Complexity of Data Reviewed Labs:  ordered.  Risk Prescription drug management.  Pt is a 21 y.o. male with a complex medical history who presents to the emergency department from behavioral health for medical clearance.  Patient initially evaluated by behavioral health and recommended for inpatient placement but due to possible ingestion was sent to the emergency department for clearance.  Patient notes that he attempted to kill himself by ingesting multiple pills.  His wife was able to get him to spit out the pills but he believes he "maybe swallowed about 2 of them".  He is unsure of the medication.  This occurred around 7 PM last night.  Labs: CBC without abnormalities. CMP with a sodium of 132, chloride of 96, CO2 21, glucose of 551, calcium of 8.6, AST of 570, ALT of 593, alk phos of 243. Salicylate less than 7. Acetaminophen less than 10. Ethanol sent in. Respiratory panel is negative. UDS is negative. UA showing greater than 500 glucose as well as 20 ketones. VBG with a pH of 7.31.  ECG: Normal sinus rhythm rightward axis.  I, Rayna Sexton, PA-C, personally reviewed and evaluated these images and lab results as part of my medical decision-making.  Unsure of the medication that the patient ingested.  Upon arrival he was endorsing suicidal ideation with a plan but had no physical complaints.  Denies any chest pain, shortness of breath, abdominal pain, nausea, vomiting, or diarrhea.  He notes a history of DKA in the past and does not feel that he is currently in DKA.  Given he was hyperglycemic around 551 I obtained a UA as well as a VBG.  UA does show 20 ketones.  VBG shows a normal pH of 7.31.  No anion gap noted on CMP.  Patient clinically does not appear to be in DKA or HHS at this time. Patient  given 1500 cc of IV fluids and repeat CBG is 404.  Patient given 5 units of subcutaneous NovoLog.  Repeat CBG 376.  Will restart patient's Lantus.  Patient also noted to have elevated AST of 570, ALT at 593, alk phos of  243.  Patient with a history of elevated LFTs in the past.  Per records, patient has history of hepatitis C positive hep C antibody on July 19, 2021.  Acetaminophen less than 10.  Total bilirubin within normal limits at 1.0.  No abdominal tenderness on my exam, particularly in the right upper quadrant.  It has been about 7-8 hours since patient's alleged ingestion of an unknown medication. No adverse events noted.  QTc within normal limits on ECG.  UDS negative.  Acetaminophen and salicylate levels within normal limits.  Patient appears medically cleared at this time.  Will reconsult TTS.  Patient will board in the ED pending inpatient placement.  Patient presented voluntarily and is not under IVC at this time.  Note: Portions of this report may have been transcribed using voice recognition software. Every effort was made to ensure accuracy; however, inadvertent computerized transcription errors may be present.   Final Clinical Impression(s) / ED Diagnoses Final diagnoses:  Elevated LFTs  Hyperglycemia  Suicidal ideation   Rx / DC Orders ED Discharge Orders     None         Rayna Sexton, PA-C 08/06/21 0252    Ripley Fraise, MD 08/06/21 [phone]

## 2021-08-06 NOTE — ED Notes (Signed)
Per EDP Dr. Denton Lank and Counselor, Doylene Canning, pt cleared for d/c and given outpatient resources for elevated LFT's, diabetes, and substance abuse.

## 2021-08-06 NOTE — ED Notes (Signed)
Pt changed into burgundy scrubs and wanded by security. Pt belongings put into belongings bag x1 and put in cabinet behind nurse station across from room 25.

## 2021-08-06 NOTE — ED Notes (Signed)
Per MD, call placed to diabetes coordinator regarding hyperglycemia. Awaiting call return.

## 2021-08-06 NOTE — ED Notes (Signed)
Pt stated to ED sitter that he is allergic to "TEA" products. Unit secretary notified and to let dietary dept know of pt's tea allergy. Tea allergy now documented in the pt's chart.

## 2021-08-06 NOTE — Progress Notes (Signed)
Inpatient Diabetes Program Recommendations  AACE/ADA: New Consensus Statement on Inpatient Glycemic Control (2015)  Target Ranges:  Prepandial:   less than 140 mg/dL      Peak postprandial:   less than 180 mg/dL (1-2 hours)      Critically ill patients:  140 - 180 mg/dL   Lab Results  Component Value Date   GLUCAP 371 (H) 08/06/2021   HGBA1C 10.5 (H) 11/09/2020    Review of Glycemic Control  Diabetes history: DM1 Outpatient Diabetes medications: Lantus 35 units BID, Novolog 1:15 CHO ratio Current orders for Inpatient glycemic control: Lantus 30 units BID, Novolog 0-15 units TID with meals  CBGs 289, 371 mg/dL  Inpatient Diabetes Program Recommendations:    Need updated BMET to make sure pt not in DKA  HgbA1C to assess glycemic control PTA Add Novolog 8 units TID with meals if eating > 50%  Spoke with pt at bedside in ED. States he usually takes Lantus 40 units BID and 1 unit for every 15 g CHO + 10 units TID  States no one has explained anything to him about when he will be transferred to [facility]. York Spaniel this is voluntary and he will sign out if someone does not speak with him. Gave message to staff.   Eating lunch at present.   Will likely need daily titration.  Thank you. Ailene Ards, RD, LDN, CDE Inpatient Diabetes Coordinator [phone]

## 2021-08-06 NOTE — ED Provider Notes (Addendum)
Emergency Medicine Observation Re-evaluation Note  Kordai Skemp. is a 21 y.o. male, seen on rounds today.  Pt initially presented to the ED for complaints of etoh and substance abuse, with feelings of depression and suicidal thoughts. Today reports feeling much improved.   Physical Exam  BP 137/84 (BP Location: Left Arm)    Pulse 76    Temp 98.1 F (36.7 C) (Oral)    Resp 16    Ht 1.778 m (5\' 10" )    Wt 90.7 kg    SpO2 98%    BMI 28.70 kg/m  Physical Exam General: resting Cardiac: regular rate Lungs: breathing comfortably Psych: resting quietly. Easily aroused. Denies SI/HI  ED Course / MDM    I have reviewed the labs performed to date as well as medications administered while in observation.  Recent changes in the last 24 hours include ED obs and reassessment.   Plan  Pts diabetes remains poorly controlled - long hx of same noted on review of charts. Will get diabetes team consult this AM, and add SS insulin for now. Will recheck cmet this AM as previously LFTs quite high.  Pt denies abd pain or n/v. Abd soft nt.   As relates his [place] c/o which is his primary reason for being here - pt not currently noted to be on any BH meds and unclear if placement is occurring. Will re-consult [facility] team this AM to reassess, make dispo rec, if inpatient desired to place with more urgency, and make med recs as relates [name] meds.   The patient has been placed in psychiatric observation due to the need to provide a safe environment for the patient while obtaining psychiatric consultation and evaluation, as well as ongoing medical and medication management to treat the patient's condition.    Dispo per [facility] team.        Lajean Saver, MD 08/06/21 1353   Pt is requesting discharge.    I re-evaluated as RN indicates pt persistently requesting d/c, does not want to stay. I discussed reasons for staying and admission, including v high lfts, high blood sugars - needing to re-evaluate, tx, etc. Also  discussed risks including progressive liver failure, dka, death.  Also discussed potential benefit of inpatient behavioral health stay/tx. Pt is alert and oriented. He appears to be thinking and processing clearly. He does not appear acutely depressed or despondent.  He does not appear to be responding to internal stimuli - no delusions, hallucinations, or acute psychosis is noted. Pt denies any thoughts of harm to self or others. He is able to understand/voice our concerns/risks, but remains not willing to stay for further testing, labs, or inpatient treatment. He indicates he does plan to follow up closely as outpatient, and indicates he will f/u as relates mental health issues/substance use, and liver issue/tests, and diabetes. He indicates he has adequate of his diabetes meds at home.  Pt denies any fever/chills. He denies abd pain or n/v. Abd is soft and non tender, pt has eaten.   Pt currently appears to have capacity to sign out AMA.   He is encourage to return if reconsiders, and additional return precautions also provided.   [facility] team updated re patients request to leave/sign out AMA, they indicate to indication to IVC, and indicate they will update his AVS for [facility].   Pt elected to leave ED AMA.     Lajean Saver, MD 08/06/21 [phone]

## 2021-08-08 ENCOUNTER — Ambulatory Visit (HOSPITAL_COMMUNITY)
Admission: RE | Admit: 2021-08-08 | Discharge: 2021-08-08 | Disposition: A | Discharge status: Home / Self Care | Payer: Medicaid Other | Attending: Psychiatry | Admitting: Psychiatry

## 2021-08-08 DIAGNOSIS — R45851 Suicidal ideations: Secondary | ICD-10-CM | POA: Insufficient documentation

## 2021-08-08 DIAGNOSIS — G4700 Insomnia, unspecified: Secondary | ICD-10-CM | POA: Diagnosis not present

## 2021-08-08 DIAGNOSIS — F32A Depression, unspecified: Secondary | ICD-10-CM | POA: Insufficient documentation

## 2021-08-08 DIAGNOSIS — R443 Hallucinations, unspecified: Secondary | ICD-10-CM | POA: Diagnosis not present

## 2021-08-08 NOTE — H&P (Addendum)
Behavioral Health Medical Screening Exam  [name] [name]. is a 21 y.o. male with  a history of schizoaffective disorder bipolar type, bipolar 1 disorder, MDD, and methamphetamine abuse, as well as past medical history significant for uncontrolled type 1 diabetes mellitus, DKA, hepatitis C, sickle cell trait, epilepsy, goiter, AKI, transaminitis, IV drug use, and diabetic peripheral neuropathy, who presents to [facility] voluntarily as a walk-in with his wife with request for inpatient admission. Patient states that his wife will not allow him to return home until he receives inpatient treatment. Patient reports intermittent suicidal ideations and "manic episodes." Patient states that he is not currently suicidal. He reports that he was planning to jump from a bridge a few days ago but his wife stopped him. He states that a couple of days after that he was going to overdose. He grabbed a few pills. States that his wife stopped him and he only swallowed one pill. Patient describes his "manic episodes" as periods of anger. His wife states that these episodes last for a few minutes to a few hours and that the patient does not recall the episodes.   Patient reports that he no longer receives ACT services. His wife states that he was discharged from [facility] about 1.5 years ago.   Patient was evaluated at [facility] as a walk-in at [facility] on 08/05/2020 and was sent to the ED for medical clearance. The patient states that he was never seen by a psychiatrist or mental health person. He states "they just discharged me the next day." On chart review, it sis noted that the patient requested discharged and left AMA. On chart review, it is noted that the patient frequently leaves the ED and hospitals AMA.   Patient reports that he has not sue illicit substances in approximately 8 months. UDS negative on 08/05/2021.   On evaluation patient is alert and oriented x 4, calm, and cooperative. Speech is clear and coherent. Mood is depressed  and affect is congruent with mood. Thought process is coherent and thought content is logical. Denies current auditory and visual hallucinations. No indication that patient is responding to internal stimuli. No evidence of delusional thought content. Denies current suicidal ideations. Denies homicidal ideations. Denies recent substance abuse.    Total Time spent with patient: 30 minutes  Psychiatric Specialty Exam:  Presentation  General Appearance: Fairly Groomed  Eye Contact:Fair  Speech:Clear and Coherent; Normal Rate  Speech Volume:Normal  Handedness:Right   Mood and Affect  Mood:Depressed  Affect:Appropriate   Thought Process  Thought Processes:Coherent; Goal Directed; Linear  Descriptions of Associations:Intact  Orientation:Full (Time, Place and Person)  Thought Content:Logical  History of Schizophrenia/Schizoaffective disorder:No  Duration of Psychotic Symptoms:Greater than six months  Hallucinations:Hallucinations: None  Ideas of Reference:None  Suicidal Thoughts:Suicidal Thoughts: No  Homicidal Thoughts:Homicidal Thoughts: No   Sensorium  Memory:Immediate Good; Recent Fair  Judgment:Intact  Insight:Present   Executive Functions  Concentration:Good  Attention Span:Good  [place]  Language:Good   Psychomotor Activity  Psychomotor Activity:Psychomotor Activity: Normal   Assets  Assets:Communication Skills; Desire for Improvement; Financial Resources/Insurance; Housing; Social Support   Sleep  Sleep:Sleep: Fair    Physical Exam: Physical Exam Constitutional:      General: He is not in acute distress.    Appearance: He is not ill-appearing, toxic-appearing or diaphoretic.  Eyes:     Pupils: Pupils are equal, round, and reactive to light.  Musculoskeletal:        General: Normal range of motion.  Neurological:     General: No focal deficit present.     Mental Status: He is alert and oriented to  person, place, and time.  Psychiatric:        Mood and Affect: Mood is depressed.        Behavior: Behavior is cooperative.        Thought Content: Thought content is not paranoid or delusional. Thought content does not include homicidal or suicidal ideation.   Review of Systems  Constitutional:  Negative for chills, diaphoresis, fever, malaise/fatigue and weight loss.  HENT:  Negative for congestion.   Respiratory:  Negative for cough and shortness of breath.   Cardiovascular:  Negative for chest pain and palpitations.  Gastrointestinal:  Negative for diarrhea, nausea and vomiting.  Neurological:  Negative for dizziness and seizures.  Psychiatric/Behavioral:  Positive for depression, hallucinations and suicidal ideas. Negative for memory loss and substance abuse. The patient is nervous/anxious and has insomnia.   All other systems reviewed and are negative.   Blood pressure 121/73, pulse (!) 104, temperature 98.7 F (37.1 C), temperature source Oral, SpO2 99 %. There is no height or weight on file to calculate BMI.  Patient has Type I DM--CBG 102  Musculoskeletal: Strength & Muscle Tone: within normal limits Gait & Station: normal Patient leans: N/A   Recommendations:  Based on my evaluation the patient does not appear to have an emergency medical condition.  Disposition: No evidence of imminent risk to self or others at present.   Supportive therapy provided about ongoing stressors. Discussed crisis plan, support from social network, calling 911, coming to the Emergency Department, and calling Suicide Hotline.  Discussed transfer to [facility] for continuous assessment. Patient declined. Patient denies current SI, HI, AVH. Patient is not an imminent risk to himself and does not meet IVC criteria.  Patient requests information for Behavioral Health Facilities that complete walk-in assessments. States that he does not want to go through an emergency department. Patient was provided  contact information for [facility], [facility], [facility], and [facility]. Patient also requested information for outpatient anger management programs. SW provided with outpatient resources.    Rozetta Nunnery, NP 08/09/2021, 6:24 AM

## 2021-08-09 ENCOUNTER — Encounter (HOSPITAL_COMMUNITY): Payer: Self-pay

## 2021-08-09 ENCOUNTER — Emergency Department (HOSPITAL_COMMUNITY)
Admission: EM | Admit: 2021-08-09 | Discharge: 2021-08-12 | Disposition: A | Discharge status: Home / Self Care | Payer: Medicaid Other | Source: Home / Self Care | Attending: Emergency Medicine | Admitting: Emergency Medicine

## 2021-08-09 DIAGNOSIS — Z9114 Patient's other noncompliance with medication regimen: Secondary | ICD-10-CM | POA: Insufficient documentation

## 2021-08-09 DIAGNOSIS — E1065 Type 1 diabetes mellitus with hyperglycemia: Secondary | ICD-10-CM | POA: Diagnosis present

## 2021-08-09 DIAGNOSIS — F319 Bipolar disorder, unspecified: Secondary | ICD-10-CM | POA: Insufficient documentation

## 2021-08-09 DIAGNOSIS — R739 Hyperglycemia, unspecified: Secondary | ICD-10-CM

## 2021-08-09 DIAGNOSIS — Z79899 Other long term (current) drug therapy: Secondary | ICD-10-CM | POA: Insufficient documentation

## 2021-08-09 DIAGNOSIS — F331 Major depressive disorder, recurrent, moderate: Secondary | ICD-10-CM | POA: Diagnosis present

## 2021-08-09 DIAGNOSIS — F259 Schizoaffective disorder, unspecified: Secondary | ICD-10-CM | POA: Diagnosis present

## 2021-08-09 DIAGNOSIS — K759 Inflammatory liver disease, unspecified: Secondary | ICD-10-CM | POA: Insufficient documentation

## 2021-08-09 DIAGNOSIS — R7401 Elevation of levels of liver transaminase levels: Secondary | ICD-10-CM | POA: Insufficient documentation

## 2021-08-09 DIAGNOSIS — F250 Schizoaffective disorder, bipolar type: Secondary | ICD-10-CM | POA: Diagnosis present

## 2021-08-09 DIAGNOSIS — Z794 Long term (current) use of insulin: Secondary | ICD-10-CM | POA: Insufficient documentation

## 2021-08-09 DIAGNOSIS — R45851 Suicidal ideations: Secondary | ICD-10-CM | POA: Insufficient documentation

## 2021-08-09 DIAGNOSIS — F1910 Other psychoactive substance abuse, uncomplicated: Secondary | ICD-10-CM | POA: Insufficient documentation

## 2021-08-09 DIAGNOSIS — Z20822 Contact with and (suspected) exposure to covid-19: Secondary | ICD-10-CM | POA: Insufficient documentation

## 2021-08-09 LAB — COMPREHENSIVE METABOLIC PANEL
ALT: 315 U/L — ABNORMAL HIGH (ref 0–44)
AST: 141 U/L — ABNORMAL HIGH (ref 15–41)
Albumin: 3.7 g/dL (ref 3.5–5.0)
Alkaline Phosphatase: 150 U/L — ABNORMAL HIGH (ref 38–126)
Anion gap: 13 (ref 5–15)
BUN: 15 mg/dL (ref 6–20)
CO2: 21 mmol/L — ABNORMAL LOW (ref 22–32)
Calcium: 8.9 mg/dL (ref 8.9–10.3)
Chloride: 92 mmol/L — ABNORMAL LOW (ref 98–111)
Creatinine, Ser: 0.65 mg/dL (ref 0.61–1.24)
GFR, Estimated: >60 mL/min (ref >60)
Glucose, Bld: 615 mg/dL (ref 70–99)
Potassium: 4.8 mmol/L (ref 3.5–5.1)
Sodium: 126 mmol/L — ABNORMAL LOW (ref 135–145)
Total Bilirubin: 1.3 mg/dL — ABNORMAL HIGH (ref 0.3–1.2)
Total Protein: 7 g/dL (ref 6.5–8.1)

## 2021-08-09 LAB — BLOOD GAS, VENOUS
Acid-base deficit: 0.8 mmol/L (ref 0.0–2.0)
Bicarbonate: 24.5 mmol/L (ref 20.0–28.0)
O2 Saturation: 76.8 %
Patient temperature: 98.6
pCO2, Ven: 45.5 mmHg (ref 44.0–60.0)
pH, Ven: 7.351 (ref 7.250–7.430)
pO2, Ven: 46.2 mmHg — ABNORMAL HIGH (ref 32.0–45.0)

## 2021-08-09 LAB — URINALYSIS, ROUTINE W REFLEX MICROSCOPIC
Bacteria, UA: NONE SEEN
Bilirubin Urine: NEGATIVE
Glucose, UA: >=500 mg/dL — AB
Hgb urine dipstick: NEGATIVE
Ketones, ur: 20 mg/dL — AB
Leukocytes,Ua: NEGATIVE
Nitrite: NEGATIVE
Protein, ur: NEGATIVE mg/dL
Specific Gravity, Urine: 1.021 (ref 1.005–1.030)
pH: 7 (ref 5.0–8.0)

## 2021-08-09 LAB — BASIC METABOLIC PANEL
Anion gap: 13 (ref 5–15)
BUN: 10 mg/dL (ref 6–20)
CO2: 22 mmol/L (ref 22–32)
Calcium: 8.4 mg/dL — ABNORMAL LOW (ref 8.9–10.3)
Chloride: 97 mmol/L — ABNORMAL LOW (ref 98–111)
Creatinine, Ser: 0.63 mg/dL (ref 0.61–1.24)
GFR, Estimated: >60 mL/min (ref >60)
Glucose, Bld: 362 mg/dL — ABNORMAL HIGH (ref 70–99)
Potassium: 4.1 mmol/L (ref 3.5–5.1)
Sodium: 132 mmol/L — ABNORMAL LOW (ref 135–145)

## 2021-08-09 LAB — RAPID URINE DRUG SCREEN, HOSP PERFORMED
Amphetamines: NOT DETECTED
Barbiturates: NOT DETECTED
Benzodiazepines: NOT DETECTED
Cocaine: NOT DETECTED
Opiates: NOT DETECTED
Tetrahydrocannabinol: NOT DETECTED

## 2021-08-09 LAB — BETA-HYDROXYBUTYRIC ACID: Beta-Hydroxybutyric Acid: 3.89 mmol/L — ABNORMAL HIGH (ref 0.05–0.27)

## 2021-08-09 LAB — GLUCOSE, CAPILLARY: Glucose-Capillary: 102 mg/dL — ABNORMAL HIGH (ref 70–99)

## 2021-08-09 LAB — CBC
HCT: 41.6 % (ref 39.0–52.0)
Hemoglobin: 14.2 g/dL (ref 13.0–17.0)
MCH: 29.2 pg (ref 26.0–34.0)
MCHC: 34.1 g/dL (ref 30.0–36.0)
MCV: 85.6 fL (ref 80.0–100.0)
Platelets: 267 10*3/uL (ref 150–400)
RBC: 4.86 MIL/uL (ref 4.22–5.81)
RDW: 12.4 % (ref 11.5–15.5)
WBC: 9.2 10*3/uL (ref 4.0–10.5)
nRBC: 0 % (ref 0.0–0.2)

## 2021-08-09 LAB — CBG MONITORING, ED
Glucose-Capillary: >600 mg/dL (ref 70–99)
Glucose-Capillary: 426 mg/dL — ABNORMAL HIGH (ref 70–99)
Glucose-Capillary: 277 mg/dL — ABNORMAL HIGH (ref 70–99)
Glucose-Capillary: 281 mg/dL — ABNORMAL HIGH (ref 70–99)
Glucose-Capillary: 283 mg/dL — ABNORMAL HIGH (ref 70–99)
Glucose-Capillary: 405 mg/dL — ABNORMAL HIGH (ref 70–99)

## 2021-08-09 LAB — ETHANOL: Alcohol, Ethyl (B): <10 mg/dL (ref <10)

## 2021-08-09 LAB — SALICYLATE LEVEL: Salicylate Lvl: <7 mg/dL — ABNORMAL LOW (ref 7.0–30.0)

## 2021-08-09 LAB — ACETAMINOPHEN LEVEL: Acetaminophen (Tylenol), Serum: <10 ug/mL — ABNORMAL LOW (ref 10–30)

## 2021-08-09 MED ORDER — DEXTROSE IN LACTATED RINGERS 5 % IV SOLN: INTRAVENOUS | Status: DC

## 2021-08-09 MED ORDER — LACTATED RINGERS IV SOLN: INTRAVENOUS | Status: DC

## 2021-08-09 MED ORDER — INSULIN REGULAR(HUMAN) IN NACL 100-0.9 UT/100ML-% IV SOLN
INTRAVENOUS | Status: DC
  Administered 2021-08-09: 13 [IU]/h via INTRAVENOUS
  Filled 2021-08-09: qty 100

## 2021-08-09 MED ORDER — LORAZEPAM 0.5 MG PO TABS
0.5000 mg | ORAL_TABLET | Freq: Once | ORAL | Status: AC
  Administered 2021-08-09: 0.5 mg via ORAL
  Filled 2021-08-09: qty 1

## 2021-08-09 MED ORDER — INSULIN GLARGINE-YFGN 100 UNIT/ML ~~LOC~~ SOLN
30.0000 [IU] | Freq: Two times a day (BID) | SUBCUTANEOUS | Status: DC
  Administered 2021-08-09 – 2021-08-12 (×7): 30 [IU] via SUBCUTANEOUS
  Filled 2021-08-09 (×9): qty 0.3

## 2021-08-09 MED ORDER — POTASSIUM CHLORIDE 10 MEQ/100ML IV SOLN
10.0000 meq | INTRAVENOUS | Status: DC
  Administered 2021-08-09: 10 meq via INTRAVENOUS
  Filled 2021-08-09: qty 100

## 2021-08-09 MED ORDER — HYDROXYZINE HCL 25 MG PO TABS
25.0000 mg | ORAL_TABLET | Freq: Three times a day (TID) | ORAL | Status: DC | PRN
  Administered 2021-08-09 – 2021-08-11 (×4): 25 mg via ORAL
  Filled 2021-08-09 (×4): qty 1

## 2021-08-09 MED ORDER — INSULIN ASPART 100 UNIT/ML IJ SOLN
0.0000 [IU] | Freq: Three times a day (TID) | INTRAMUSCULAR | Status: DC
  Administered 2021-08-09 – 2021-08-10 (×3): 5 [IU] via SUBCUTANEOUS
  Administered 2021-08-10 (×2): 7 [IU] via SUBCUTANEOUS
  Administered 2021-08-11: 3 [IU] via SUBCUTANEOUS
  Filled 2021-08-09: qty 0.09

## 2021-08-09 MED ORDER — POTASSIUM CHLORIDE CRYS ER 20 MEQ PO TBCR: 20.0000 meq | EXTENDED_RELEASE_TABLET | Freq: Once | ORAL | Status: DC

## 2021-08-09 MED ORDER — DEXTROSE 50 % IV SOLN: 0.0000 mL | INTRAVENOUS | Status: DC | PRN

## 2021-08-09 MED ORDER — LACTATED RINGERS IV BOLUS
20.0000 mL/kg | Freq: Once | INTRAVENOUS | Status: AC
  Administered 2021-08-09: 1814 mL via INTRAVENOUS
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
## 2021-08-09 NOTE — ED Notes (Signed)
Critical Value: Glucose 615

## 2021-08-09 NOTE — ED Notes (Signed)
Patient requesting something for anxiety. MD made aware . 

## 2021-08-09 NOTE — ED Notes (Addendum)
Pt belongings placed in 1-4 cabinets by nurses desk. 5 bags and labeled with pts name. Items in bag: phone, shoes, clothes, and insulin supplies.   Pt dressed out into burgundy scrubs. CBG reading "high". Pt moved to medical side of ED for further treatment.   Report given to [place], Charity fundraiser- charge

## 2021-08-09 NOTE — Consult Note (Signed)
Initial Consultation Note   Patient: [name] [name]. QAS:[id number] DOB: 11-18-2000 PCP: Patient, No Pcp Per (Inactive) DOA: 08/09/2021 DOS: the patient was seen and examined on 08/09/2021 Primary service: Malvin Johns, MD  Referring physician: [place] PA Reason for consult: Uncontrolled diabetes  Assessment/Plan: Assessment and Plan: Uncontrolled diabetes with hyperglycemia type I-resume Lantus at 30 units twice a day with sliding scale insulin.  Patient reports taking 40 units twice a day at home and carb counting.  Provide diabetic diet.  Anion gap normal.  Not acidotic.  Continue outpatient management.  History of polysubstance abuse-patient denies any IV drug abuse or any other abuse of substances.  Urine drug screen pending.  Chronically elevated LFTs-acute hepatitis panel pending.  In the mid 300 range.  No further work-up.  Suicidal with history of bipolar disorder-currently not on any medications.  Will likely need inpatient psych.  If not patient can be discharged home with follow-up with PCP in 1 week.  Patient medically clear for any psychiatric inpatient treatment plan if necessary.     TRH will sign off at present, please call us again when needed.  HPI: [name] [name]. is a 21 y.o. male with past medical history of type 1 diabetes, bipolar disorder, polysubstance abuse, borderline personality disorder, oppositional defiant comes in because of wanting to throw himself in front of a vehicle and killing himself today.  Patient has been under the care of behavioral health and was seen yesterday was told to come to the emergency department for medical clearance.  Apparently he has recently been kicked out of the home by his wife because he needs to get mental health help because of suicidal ideations that have been going on for a while.  He does not take any medications for his bipolar disorder.  He reports he is compliant with his insulin.  He takes Lantus 40 units twice  a day along with carb counting sliding scale insulin.  It is been 2 days since he has been living on the street without any insulin products.  He denies any nausea vomiting diarrhea.  He denies any other symptoms such as fever or urinary symptoms.  Patient sugar was initially over 600.  He was placed on insulin drip.  He was given IV fluids.  His sugar is down to 200.  His anion gap was 13.  Emergency room is referring patient for admission for uncontrolled diabetes.    Review of Systems: As mentioned in the history of present illness. All other systems reviewed and are negative. Past Medical History:  Diagnosis Date   ADD (attention deficit disorder)    Allergy    Anxiety    Asthma    Depression    Epileptic seizure ([place])    "both petit and grand mal; last sz ~ 2 wk ago" (06/17/2013)   NLGXQJJH(417.4)    "2-3 times/wk usually" (06/17/2013)   Heart murmur    "heard a slight one earlier today" (06/17/2013)   Homeless    Migraine    "maybe once/month; it's severe" (06/17/2013)   ODD (oppositional defiant disorder)    Sickle cell trait ([place])    Type I diabetes mellitus ([place])    "that's what they're thinking now" (06/17/2013)   Vision abnormalities    "takes him longer to focus cause; from his sz" (06/17/2013)   Past Surgical History:  Procedure Laterality Date   CIRCUMCISION  2000   FINGER SURGERY Left 2001   "crushed pinky; had to repair it" (  06/17/2013)   Social History:  reports that he quit smoking about 13 months ago. His smoking use included cigarettes. He smoked an average of .5 packs per day. He has never used smokeless tobacco. He reports current drug use. Drug: Methamphetamines. He reports that he does not drink alcohol.  Allergies  Allergen Reactions   Bee Venom Anaphylaxis   Fish Allergy Anaphylaxis   Shellfish Allergy Anaphylaxis   Tea Anaphylaxis    Family History  Problem Relation Age of Onset   Asthma Mother    COPD Mother    Other Mother        possible  autoimmune, unclear   Migraines Sister        Hemiplegic Migraines    Diabetes Maternal Grandmother    Heart disease Maternal Grandmother    Hypertension Maternal Grandmother    Mental illness Maternal Grandmother    Heart disease Maternal Grandfather    Hyperlipidemia Paternal Grandmother    Hyperlipidemia Paternal Grandfather     Prior to Admission medications   Medication Sig Start Date End Date Taking? Authorizing Provider  albuterol (VENTOLIN HFA) 108 (90 Base) MCG/ACT inhaler Inhale 2 puffs into the lungs every 6 (six) hours as needed for wheezing or shortness of breath.    [provider]  blood glucose meter kit and supplies KIT Dispense based on patient and insurance preference. Use up to four times daily as directed. (FOR ICD-9 250.00, 250.01). 11/09/20   Florencia Reasons, MD  EPINEPHrine 0.3 mg/0.3 mL IJ SOAJ injection Inject 0.3 mg into the muscle once as needed for anaphylaxis (severe allergic reaction). Patient not taking: Reported on 08/06/2021 05/02/20   [provider]  glucose blood (FREESTYLE TEST STRIPS) test strip Use as instructed 11/09/20   Florencia Reasons, MD  insulin aspart (NOVOLOG FLEXPEN) 100 UNIT/ML FlexPen Inject 1-15 Units into the skin 3 (three) times daily with meals. 1 unit/g of carbohydrate sliding scale. Patient taking differently: Inject 1-15 Units into the skin See admin instructions. Inject 1-15 units into the skin three times a day with meals, per correction carb correction scale: 1 unit of insulin for every 15 grams of carbohydrates 11/09/20   Florencia Reasons, MD  insulin glargine (LANTUS) 100 UNIT/ML Solostar Pen Inject 30 Units into the skin 2 (two) times daily. Patient taking differently: Inject 35 Units into the skin in the morning and at bedtime. 11/09/20   Florencia Reasons, MD  Insulin Pen Needle (ADVOCATE INSULIN PEN NEEDLES) 29G X 12.7MM MISC For type I diabetes 11/09/20   Florencia Reasons, MD  lisinopril (ZESTRIL) 5 MG tablet Take 5 mg by mouth daily. Patient not  taking: Reported on 08/08/2021 06/05/21   [provider]  ondansetron (ZOFRAN) 4 MG tablet Take 4 mg by mouth every 8 (eight) hours as needed for nausea or vomiting. 07/25/21   [provider]  pantoprazole (PROTONIX) 40 MG tablet Take 40 mg by mouth 2 (two) times daily. 07/25/21   [provider]  QUEtiapine (SEROQUEL) 50 MG tablet Take 50 mg by mouth at bedtime. 07/25/21   [provider]  sertraline (ZOLOFT) 50 MG tablet Take 50 mg by mouth at bedtime. 07/25/21   [provider]    Physical Exam: Vitals:   08/09/21 1146 08/09/21 1215 08/09/21 1300 08/09/21 1315  BP: (!) 144/86 130/74 131/86 133/72  Pulse: (!) 109 (!) 103 96 96  Resp: _0 Temp:      SpO2: 100% 98% 99% 99%   Physical Exam  Vitals and nursing note reviewed.  Constitutional:      Appearance: Normal appearance. He is not ill-appearing.  HENT:     Head: Normocephalic.     Nose: Nose normal.     Mouth/Throat:     Mouth: Mucous membranes are dry.  Eyes:     General:        Right eye: No discharge.        Left eye: No discharge.     Extraocular Movements: Extraocular movements intact.     Conjunctiva/sclera: Conjunctivae normal.     Pupils: Pupils are equal, round, and reactive to light.  Cardiovascular:     Rate and Rhythm: Normal rate and regular rhythm.     Pulses: Normal pulses.     Heart sounds: Normal heart sounds. No murmur heard.   No friction rub. No gallop.  Pulmonary:     Effort: Pulmonary effort is normal. No respiratory distress.     Breath sounds: Normal breath sounds. No wheezing, rhonchi or rales.  Abdominal:     General: Abdomen is flat. Bowel sounds are normal. There is no distension.     Palpations: Abdomen is soft.     Tenderness: There is no abdominal tenderness.  Musculoskeletal:        General: Normal range of motion.     Cervical back: Normal range of motion and neck supple.     Right lower leg: No edema.     Left lower leg: No edema.   Skin:    General: Skin is warm and dry.     Capillary Refill: Capillary refill takes 2 to 3 seconds.     Findings: No lesion or rash.  Neurological:     General: No focal deficit present.     Mental Status: He is alert and oriented to person, place, and time. Mental status is at baseline.     Cranial Nerves: No cranial nerve deficit.  Psychiatric:        Mood and Affect: Mood normal.        Thought Content: Thought content normal.        Judgment: Judgment normal.     Data Reviewed:  Anion gap 13   Family Communication: None Primary team communication: Yes to the emergency department PA Thank you very much for involving [place] in the care of your patient.  Author: Phillips Grout, MD 08/09/2021 1:26 PM

## 2021-08-09 NOTE — ED Notes (Signed)
Insulin gtt stopped per PA.  Pt given sandwich and diet coke.

## 2021-08-09 NOTE — ED Notes (Signed)
Patients sitter went to lunch.
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
## 2021-08-09 NOTE — ED Notes (Signed)
Patient was given his dinner tray.

## 2021-08-09 NOTE — ED Notes (Signed)
Pt's spouse called stating that this pt has called her several times while in this department from several different phone numbers. She states she has an order against this pt and does not want him to contact her anymore.

## 2021-08-09 NOTE — ED Provider Notes (Signed)
[facility] DEPT Provider Note   CSN: [id number] Arrival date & time: 08/09/21  1540     History  Chief Complaint  Patient presents with   Suicidal   Hyperglycemia    [name] Doom. is a 21 y.o. male with a past medical history of type 1 diabetes, bipolar disorder, borderline personality disorder, and schizoaffective disorder presenting today due to suicidal ideation.  Patient reports that he was kicked out of his home and has lost his wife and kids.  This happened yesterday and since then he has been wandering the streets.  He called 911 because he was planning to jump in front of a car and stated that he needed mental health help.  Denies any HI, AVH.  Reports being out of his medication for diabetes for the past few days.  Has a history of medication noncompliance and recurrent DKA visits.   Home Medications Prior to Admission medications   Medication Sig Start Date End Date Taking? Authorizing Provider  albuterol (VENTOLIN HFA) 108 (90 Base) MCG/ACT inhaler Inhale 2 puffs into the lungs every 6 (six) hours as needed for wheezing or shortness of breath.    [provider]  blood glucose meter kit and supplies KIT Dispense based on patient and insurance preference. Use up to four times daily as directed. (FOR ICD-9 250.00, 250.01). 11/09/20   Florencia Reasons, MD  EPINEPHrine 0.3 mg/0.3 mL IJ SOAJ injection Inject 0.3 mg into the muscle once as needed for anaphylaxis (severe allergic reaction). Patient not taking: Reported on 08/06/2021 05/02/20   [provider]  glucose blood (FREESTYLE TEST STRIPS) test strip Use as instructed 11/09/20   Florencia Reasons, MD  insulin aspart (NOVOLOG FLEXPEN) 100 UNIT/ML FlexPen Inject 1-15 Units into the skin 3 (three) times daily with meals. 1 unit/g of carbohydrate sliding scale. Patient taking differently: Inject 1-15 Units into the skin See admin instructions. Inject 1-15 units into the skin three times a day  with meals, per correction carb correction scale: 1 unit of insulin for every 15 grams of carbohydrates 11/09/20   Florencia Reasons, MD  insulin glargine (LANTUS) 100 UNIT/ML Solostar Pen Inject 30 Units into the skin 2 (two) times daily. Patient taking differently: Inject 35 Units into the skin in the morning and at bedtime. 11/09/20   Florencia Reasons, MD  Insulin Pen Needle (ADVOCATE INSULIN PEN NEEDLES) 29G X 12.7MM MISC For type I diabetes 11/09/20   Florencia Reasons, MD  lisinopril (ZESTRIL) 5 MG tablet Take 5 mg by mouth daily. Patient not taking: Reported on 08/08/2021 06/05/21   [provider]  ondansetron (ZOFRAN) 4 MG tablet Take 4 mg by mouth every 8 (eight) hours as needed for nausea or vomiting. 07/25/21   [provider]  pantoprazole (PROTONIX) 40 MG tablet Take 40 mg by mouth 2 (two) times daily. 07/25/21   [provider]  QUEtiapine (SEROQUEL) 50 MG tablet Take 50 mg by mouth at bedtime. 07/25/21   [provider]  sertraline (ZOLOFT) 50 MG tablet Take 50 mg by mouth at bedtime. 07/25/21   [provider]      Allergies    Bee venom, Fish allergy, Shellfish allergy, and Tea    Review of Systems   Review of Systems  Constitutional:  Negative for chills and fever.  Respiratory:  Negative for shortness of breath.   Gastrointestinal:  Negative for nausea and vomiting.  Psychiatric/Behavioral:  Positive for agitation, behavioral problems, sleep disturbance and suicidal ideas.  The patient is nervous/anxious.   See HPI  Physical Exam Updated Vital Signs BP 121/74    Pulse 92    Temp 98 F (36.7 C)    Resp 18    SpO2 100%  Physical Exam Vitals and nursing note reviewed.  Constitutional:      General: He is not in acute distress.    Appearance: Normal appearance. He is not ill-appearing.  HENT:     Head: Normocephalic and atraumatic.  Eyes:     General: No scleral icterus.    Conjunctiva/sclera: Conjunctivae normal.  Cardiovascular:     Rate and Rhythm:  Normal rate and regular rhythm.  Pulmonary:     Effort: Pulmonary effort is normal. No respiratory distress.  Abdominal:     General: Abdomen is flat.     Palpations: Abdomen is soft.     Tenderness: There is no abdominal tenderness.  Skin:    General: Skin is warm and dry.     Findings: No rash.  Neurological:     Mental Status: He is alert.  Psychiatric:     Comments: Anxious and teary-eyed    ED Results / Procedures / Treatments   Labs (all labs ordered are listed, but only abnormal results are displayed) Labs Reviewed  COMPREHENSIVE METABOLIC PANEL - Abnormal; Notable for the following components:      Result Value   Sodium 126 (*)    Chloride 92 (*)    CO2 21 (*)    Glucose, Bld 615 (*)    AST 141 (*)    ALT 315 (*)    Alkaline Phosphatase 150 (*)    Total Bilirubin 1.3 (*)    All other components within normal limits  SALICYLATE LEVEL - Abnormal; Notable for the following components:   Salicylate Lvl <0.5 (*)    All other components within normal limits  ACETAMINOPHEN LEVEL - Abnormal; Notable for the following components:   Acetaminophen (Tylenol), Serum <10 (*)    All other components within normal limits  BETA-HYDROXYBUTYRIC ACID - Abnormal; Notable for the following components:   Beta-Hydroxybutyric Acid 3.89 (*)    All other components within normal limits  BLOOD GAS, VENOUS - Abnormal; Notable for the following components:   pO2, Ven 46.2 (*)    All other components within normal limits  CBG MONITORING, ED - Abnormal; Notable for the following components:   Glucose-Capillary >600 (*)    All other components within normal limits  CBG MONITORING, ED - Abnormal; Notable for the following components:   Glucose-Capillary 426 (*)    All other components within normal limits  CBG MONITORING, ED - Abnormal; Notable for the following components:   Glucose-Capillary 277 (*)    All other components within normal limits  CBG MONITORING, ED - Abnormal; Notable for  the following components:   Glucose-Capillary 281 (*)    All other components within normal limits  ETHANOL  CBC  RAPID URINE DRUG SCREEN, HOSP PERFORMED  URINALYSIS, ROUTINE W REFLEX MICROSCOPIC  HEPATITIS PANEL, ACUTE  BASIC METABOLIC PANEL    EKG None  Radiology No results found.  Procedures Procedures    Medications Ordered in ED Medications  insulin glargine-yfgn (SEMGLEE) injection 30 Units (30 Units Subcutaneous Given 08/09/21 1417)  insulin aspart (novoLOG) injection 0-9 Units (5 Units Subcutaneous Given 08/09/21 1330)  lactated ringers bolus 1,814 mL (0 mLs Intravenous Stopped 08/09/21 1325)  LORazepam (ATIVAN) tablet 0.5 mg (0.5 mg Oral Given 08/09/21 1330)    ED Course/ Medical  Decision Making/ A&P Clinical Course as of 08/09/21 1219  Fri Aug 09, 2021  1214 Alkaline Phosphatase(!): 150 [MR]    Clinical Course User Index [MR] Sheniya Garciaperez, Cecilio Asper, PA-C                           Medical Decision Making Amount and/or Complexity of Data Reviewed Labs: ordered. Decision-making details documented in ED Course.  Risk Prescription drug management.   Patient presents to the ED for concern of SI 2/2 stress at home.  On arrival, patient's blood sugar noted to be 615.  History of uncontrolled type 1 diabetes.   Additional history obtained from internal and external records: I did an internal chart review and was able to see patient's previous visits for hyperglycemia and multiple for DKA.  Has been admitted to the hospital multiple times.  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   The pertinent results include:  Blood glucose 615 Beta hydroxybutyrate 3.89 Sodium 126 Elevated LFTs  2. Cardiac Monitoring:  The patient was maintained on a cardiac monitor.  I personally viewed and interpreted the cardiac monitored which showed NSR  3. Consultations  Obtained:  I requested consultation with the hospitalist,  and discussed lab and imaging findings as well as pertinent plan - they recommend: Getting the patient back on his daily insulin regimen.  Treatment: Due to patient's noncompliance with insulin and diabetes care, patient was begun on an insulin drip with potassium.  Original anion gap was normal, he falls more in line with hyperglycemia than DKA.  Plan will be to bring down patient's glucose, medically clear him and let TTS evaluate him.  Medications:  I ordered medication including Ativan for patient's anxiety and difficulty sleeping.  On reevaluation patient is sleeping comfortably.  -Insulin fluids brought down his blood sugar to 281   Dispo:  Problem List / ED Course:  Hyperglycemia, suicidal ideation social determinants of health include: Homelessness, substance use disorder, untreated psychiatric condition and familial problems   Dispostion: Patient is not under IVC order.  He is here requesting assistance voluntarily.   Plan and disposition will be made by TTS.  Throughout his evaluation and time in the ED, we will continue with glucose checks.  Final Clinical Impression(s) / ED Diagnoses Final diagnoses:  Hyperglycemia  Hepatitis  Suicidal ideations    Rx / DC Orders Medically clear, TTS to dispo   [name] Hammock, PA-C 08/09/21 1533    Malvin Johns, MD 08/09/21 1534

## 2021-08-10 LAB — BASIC METABOLIC PANEL
Anion gap: 7 (ref 5–15)
BUN: 10 mg/dL (ref 6–20)
CO2: 25 mmol/L (ref 22–32)
Calcium: 8.7 mg/dL — ABNORMAL LOW (ref 8.9–10.3)
Chloride: 103 mmol/L (ref 98–111)
Creatinine, Ser: 0.57 mg/dL — ABNORMAL LOW (ref 0.61–1.24)
GFR, Estimated: >60 mL/min (ref >60)
Glucose, Bld: 254 mg/dL — ABNORMAL HIGH (ref 70–99)
Potassium: 4 mmol/L (ref 3.5–5.1)
Sodium: 135 mmol/L (ref 135–145)

## 2021-08-10 LAB — CBC WITH DIFFERENTIAL/PLATELET
Abs Immature Granulocytes: 0.17 10*3/uL — ABNORMAL HIGH (ref 0.00–0.07)
Basophils Absolute: 0.1 10*3/uL (ref 0.0–0.1)
Basophils Relative: 1 %
Eosinophils Absolute: 0.1 10*3/uL (ref 0.0–0.5)
Eosinophils Relative: 2 %
HCT: 44.8 % (ref 39.0–52.0)
Hemoglobin: 14.9 g/dL (ref 13.0–17.0)
Immature Granulocytes: 3 %
Lymphocytes Relative: 33 %
Lymphs Abs: 1.8 10*3/uL (ref 0.7–4.0)
MCH: 28.8 pg (ref 26.0–34.0)
MCHC: 33.3 g/dL (ref 30.0–36.0)
MCV: 86.5 fL (ref 80.0–100.0)
Monocytes Absolute: 0.5 10*3/uL (ref 0.1–1.0)
Monocytes Relative: 9 %
Neutro Abs: 2.7 10*3/uL (ref 1.7–7.7)
Neutrophils Relative %: 52 %
Platelets: 266 10*3/uL (ref 150–400)
RBC: 5.18 MIL/uL (ref 4.22–5.81)
RDW: 12.6 % (ref 11.5–15.5)
WBC: 5.3 10*3/uL (ref 4.0–10.5)
nRBC: 0 % (ref 0.0–0.2)

## 2021-08-10 LAB — BLOOD GAS, VENOUS
Acid-Base Excess: 0.2 mmol/L (ref 0.0–2.0)
Bicarbonate: 26.3 mmol/L (ref 20.0–28.0)
O2 Saturation: 65.6 %
Patient temperature: 98.6
pCO2, Ven: 50.5 mmHg (ref 44.0–60.0)
pH, Ven: 7.337 (ref 7.250–7.430)
pO2, Ven: 38.5 mmHg (ref 32.0–45.0)

## 2021-08-10 LAB — CBG MONITORING, ED
Glucose-Capillary: 260 mg/dL — ABNORMAL HIGH (ref 70–99)
Glucose-Capillary: 303 mg/dL — ABNORMAL HIGH (ref 70–99)
Glucose-Capillary: 324 mg/dL — ABNORMAL HIGH (ref 70–99)
Glucose-Capillary: 264 mg/dL — ABNORMAL HIGH (ref 70–99)

## 2021-08-10 LAB — BETA-HYDROXYBUTYRIC ACID: Beta-Hydroxybutyric Acid: 0.6 mmol/L — ABNORMAL HIGH (ref 0.05–0.27)

## 2021-08-10 MED ORDER — SERTRALINE HCL 50 MG PO TABS
50.0000 mg | ORAL_TABLET | Freq: Every day | ORAL | Status: DC
  Administered 2021-08-10 – 2021-08-12 (×3): 50 mg via ORAL
  Filled 2021-08-10 (×3): qty 1

## 2021-08-10 MED ORDER — QUETIAPINE FUMARATE 50 MG PO TABS
50.0000 mg | ORAL_TABLET | Freq: Every day | ORAL | Status: DC
  Administered 2021-08-10 – 2021-08-11 (×2): 50 mg via ORAL
  Filled 2021-08-10 (×2): qty 1

## 2021-08-10 NOTE — BH Assessment (Signed)
Pt was reviewed by [facility] and NP Shalon to determine if pt is appropriate for the [facility] setting. It was determined pt may not yet be medically stable and should be re-assessed at [facility] by psychiatry later this morning.

## 2021-08-10 NOTE — Progress Notes (Signed)
Inpatient Behavioral Health Placement  Pt meets inpatient criteria per Maxie Barb, NP.  There are no appropriate beds at [facility] per Day shift [facility] AC Tommy Medal, Charity fundraiser. Referral was sent to the following facilities;   Destination Service Provider Address Phone Fax  [facility]  [address], [place] [address] [phone]  [facility]  [address] [place], [place] [address] [phone]  [facility]  [address] [place] [address] [phone]  [facility]  [address] [place], [place] [address] [phone]  [facility]  [address] [place] [address] [phone]  [facility]  [address], [place] [address] [phone]  [facility]  [address] [place] [address] [phone]  [facility]  [address], [place] [address] [phone]  [facility]  [address] [place] [address] [phone]  [facility]  [address], [place] [address] [phone]  [facility]  [address], [place] [address] [phone]  [facility]  [address] [place] [address] [phone]  [facility]  [address] [place] [address] [phone]  [facility]  [address] [place] [address] [phone]  [facility]  [address], [place] [address] [phone]  [facility]  [address], [place] [address] [phone]     Situation ongoing,  CSW will follow  up.   Maryjean Ka, MSW, [facility] 08/10/2021  @ 4:46 PM

## 2021-08-10 NOTE — Consult Note (Signed)
Telepsych Consultation   Reason for Consult:  psych consult Referring Physician:  Mervyn Gay PA-C Location of Patient: [id number] Location of Provider: [facility]  Patient Identification: [name] [name]. MRN:  [id number] Principal Diagnosis: <principal problem not specified> Diagnosis:  Active Problems:   MDD (major depressive disorder), recurrent episode, moderate (HCC)   Suicidal ideations   Hyperglycemia due to type 1 diabetes mellitus (HCC)   Non compliance w medication regimen   Bipolar disorder ([place])   Polysubstance abuse ([place])   Total Time spent with patient: 20 minutes  Subjective:   [name] [name]. is a 21 y.o. male patient admitted with suicidal ideations.  "Trying to seek inpatient for mental health for depression and anger issues".  I haven't been on my mental health meds for almost 3 years. I have been having problems with hearing voices. I lied and told them I was fine. I don't want to hurt nobody else but I do want to hurt myself. I just need help".   Patient continues to endorse active suicidal ideations with auditory and visual hallucinations. He denies any homicidal ideations.  Endorses active auditory or visual hallucinations; denies any active substance use. UDS-, BAL<10.   HPI:  [name] [name] is a 21 year old male with a past history of schizoaffective disorder bipolar type, major depressive disorder, suicidal ideations, polysubstance abuse, borderline personality disorder who presented to [facility] 08/09/21 with hyperglycemia where he presented with suicidal ideations. Of note patient presented to [facility] 08/08/21 voluntarily with his wife for assessment of "intermittent suicidal ideations and manic episodes"; he denied active suicidal ideations and was discharged with outpatient resources after he declined admission to [facility] for observation.  Past Psychiatric History: MDD recurrent, suicidal ideations, bipolar disorder, polysubstance  abuse  Risk to Self:   Risk to Others:   Prior Inpatient Therapy:   Prior Outpatient Therapy:    Past Medical History:  Past Medical History:  Diagnosis Date   ADD (attention deficit disorder)    Allergy    Anxiety    Asthma    Depression    Epileptic seizure ([place])    "both petit and grand mal; last sz ~ 2 wk ago" (06/17/2013)   MBWGYKZL(935.7)    "2-3 times/wk usually" (06/17/2013)   Heart murmur    "heard a slight one earlier today" (06/17/2013)   Homeless    Migraine    "maybe once/month; it's severe" (06/17/2013)   ODD (oppositional defiant disorder)    Sickle cell trait ([place])    Type I diabetes mellitus ([place])    "that's what they're thinking now" (06/17/2013)   Vision abnormalities    "takes him longer to focus cause; from his sz" (06/17/2013)    Past Surgical History:  Procedure Laterality Date   CIRCUMCISION  2000   FINGER SURGERY Left 2001   "crushed pinky; had to repair it" (06/17/2013)   Family History:  Family History  Problem Relation Age of Onset   Asthma Mother    COPD Mother    Other Mother        possible autoimmune, unclear   Migraines Sister        Hemiplegic Migraines    Diabetes Maternal Grandmother    Heart disease Maternal Grandmother    Hypertension Maternal Grandmother    Mental illness Maternal Grandmother    Heart disease Maternal Grandfather    Hyperlipidemia Paternal Grandmother    Hyperlipidemia Paternal Grandfather    Family Psychiatric  History: not  noted Social History:  Social History   Substance and Sexual Activity  Alcohol Use No   Alcohol/week: 0.0 standard drinks     Social History   Substance and Sexual Activity  Drug Use Yes   Types: Methamphetamines   Comment: 10- 14 times a week    Social History   Socioeconomic History   Marital status: Significant Other    Spouse name: Not on file   Number of children: 1   Years of education: Not on file   Highest education level: Not on file  Occupational History    Occupation: Unemployed  Tobacco Use   Smoking status: Former    Packs/day: 0.50    Types: Cigarettes    Quit date: 06/2020    Years since quitting: 1.1   Smokeless tobacco: Never   Tobacco comments:    Mom and dad smoke outside   Vaping Use   Vaping Use: Former  Substance and Sexual Activity   Alcohol use: No    Alcohol/week: 0.0 standard drinks   Drug use: Yes    Types: Methamphetamines    Comment: 10- 14 times a week   Sexual activity: Yes  Other Topics Concern   Not on file  Social History Narrative   Lives with significant other/fiancee; followed by [facility]   Social Determinants of Health   Financial Resource Strain: Not on file  Food Insecurity: Not on file  Transportation Needs: Not on file  Physical Activity: Not on file  Stress: Not on file  Social Connections: Not on file   Additional Social History:    Allergies:   Allergies  Allergen Reactions   Bee Venom Anaphylaxis   Fish Allergy Anaphylaxis   Shellfish Allergy Anaphylaxis   Tea Anaphylaxis    Labs:  Results for orders placed or performed during the hospital encounter of 08/09/21 (from the past 48 hour(s))  CBG monitoring, ED     Status: Abnormal   Collection Time: 08/09/21  8:32 AM  Result Value Ref Range   Glucose-Capillary >600 (HH) 70 - 99 mg/dL    Comment: Glucose reference range applies only to samples taken after fasting for at least 8 hours.  Comprehensive metabolic panel     Status: Abnormal   Collection Time: 08/09/21  8:42 AM  Result Value Ref Range   Sodium 126 (L) 135 - 145 mmol/L   Potassium 4.8 3.5 - 5.1 mmol/L   Chloride 92 (L) 98 - 111 mmol/L   CO2 21 (L) 22 - 32 mmol/L   Glucose, Bld 615 (HH) 70 - 99 mg/dL    Comment: Glucose reference range applies only to samples taken after fasting for at least 8 hours. CRITICAL RESULT CALLED TO, READ BACK BY AND VERIFIED WITH: BIENFANG, A. RN ON 08/09/2021 @ 0951 BY MECIAL J.    BUN 15 6 - 20 mg/dL   Creatinine, Ser 0.65 0.61 -  1.24 mg/dL   Calcium 8.9 8.9 - 10.3 mg/dL   Total Protein 7.0 6.5 - 8.1 g/dL   Albumin 3.7 3.5 - 5.0 g/dL   AST 141 (H) 15 - 41 U/L   ALT 315 (H) 0 - 44 U/L   Alkaline Phosphatase 150 (H) 38 - 126 U/L   Total Bilirubin 1.3 (H) 0.3 - 1.2 mg/dL   GFR, Estimated >60 >60 mL/min    Comment: (NOTE) Calculated using the CKD-EPI Creatinine Equation (2021)    Anion gap 13 5 - 15    Comment: Performed at [facility], [place] [address] [place], [place] [address]  Ethanol     Status: None   Collection Time: 08/09/21  8:42 AM  Result Value Ref Range   Alcohol, Ethyl (B) <10 <10 mg/dL    Comment: (NOTE) Lowest detectable limit for serum alcohol is 10 mg/dL.  For medical purposes only. Performed at [facility], [place] [address], [place], [place] [address]   Salicylate level     Status: Abnormal   Collection Time: 08/09/21  8:42 AM  Result Value Ref Range   Salicylate Lvl <8.0 (L) 7.0 - 30.0 mg/dL    Comment: Performed at [facility], [place] [address] [place], [place] [address]  Acetaminophen level     Status: Abnormal   Collection Time: 08/09/21  8:42 AM  Result Value Ref Range   Acetaminophen (Tylenol), Serum <10 (L) 10 - 30 ug/mL    Comment: (NOTE) Therapeutic concentrations vary significantly. A range of 10-30 ug/mL  may be an effective concentration for many patients. However, some  are best treated at concentrations outside of this range. Acetaminophen concentrations >150 ug/mL at 4 hours after ingestion  and >50 ug/mL at 12 hours after ingestion are often associated with  toxic reactions.  Performed at [facility], [place] [address], [place], [place] [address]   cbc     Status: None   Collection Time: 08/09/21  8:42 AM  Result Value Ref Range   WBC 9.2 4.0 - 10.5 K/uL   RBC 4.86 4.22 - 5.81 MIL/uL   Hemoglobin 14.2 13.0 - 17.0 g/dL   HCT 41.6 39.0 - 52.0 %   MCV 85.6 80.0 - 100.0 fL   MCH 29.2 26.0 -  34.0 pg   MCHC 34.1 30.0 - 36.0 g/dL   RDW 12.4 11.5 - 15.5 %   Platelets 267 150 - 400 K/uL   nRBC 0.0 0.0 - 0.2 %    Comment: Performed at [facility], [place] [address], [place], [place] [address]  Beta-hydroxybutyric acid     Status: Abnormal   Collection Time: 08/09/21  8:42 AM  Result Value Ref Range   Beta-Hydroxybutyric Acid 3.89 (H) 0.05 - 0.27 mmol/L    Comment: Performed at [facility], [place] [address], [place], [place] [address]  Blood gas, venous     Status: Abnormal   Collection Time: 08/09/21 10:14 AM  Result Value Ref Range   pH, Ven 7.351 7.250 - 7.430   pCO2, Ven 45.5 44.0 - 60.0 mmHg   pO2, Ven 46.2 (H) 32.0 - 45.0 mmHg   Bicarbonate 24.5 20.0 - 28.0 mmol/L   Acid-base deficit 0.8 0.0 - 2.0 mmol/L   O2 Saturation 76.8 %   Patient temperature 98.6     Comment: Performed at [facility], [place] [address] [place], [place] [address]  CBG monitoring, ED     Status: Abnormal   Collection Time: 08/09/21 11:15 AM  Result Value Ref Range   Glucose-Capillary 426 (H) 70 - 99 mg/dL    Comment: Glucose reference range applies only to samples taken after fasting for at least 8 hours.  CBG monitoring, ED     Status: Abnormal   Collection Time: 08/09/21 12:14 PM  Result Value Ref Range   Glucose-Capillary 277 (H) 70 - 99 mg/dL    Comment: Glucose reference range applies only to samples taken after fasting for at least 8 hours.  CBG monitoring, ED     Status: Abnormal  Collection Time: 08/09/21  1:24 PM  Result Value Ref Range   Glucose-Capillary 281 (H) 70 - 99 mg/dL    Comment: Glucose reference range applies only to samples taken after fasting for at least 8 hours.  Basic metabolic panel     Status: Abnormal   Collection Time: 08/09/21  2:19 PM  Result Value Ref Range   Sodium 132 (L) 135 - 145 mmol/L   Potassium 4.1 3.5 - 5.1 mmol/L   Chloride 97 (L) 98 - 111 mmol/L   CO2 22 22 - 32 mmol/L   Glucose, Bld 362 (H)  70 - 99 mg/dL    Comment: Glucose reference range applies only to samples taken after fasting for at least 8 hours.   BUN 10 6 - 20 mg/dL   Creatinine, Ser 0.63 0.61 - 1.24 mg/dL   Calcium 8.4 (L) 8.9 - 10.3 mg/dL   GFR, Estimated >60 >60 mL/min    Comment: (NOTE) Calculated using the CKD-EPI Creatinine Equation (2021)    Anion gap 13 5 - 15    Comment: Performed at [facility], [place] [address], [place], [place] [address]  Rapid urine drug screen (hospital performed)     Status: None   Collection Time: 08/09/21  5:55 PM  Result Value Ref Range   Opiates NONE DETECTED NONE DETECTED   Cocaine NONE DETECTED NONE DETECTED   Benzodiazepines NONE DETECTED NONE DETECTED   Amphetamines NONE DETECTED NONE DETECTED   Tetrahydrocannabinol NONE DETECTED NONE DETECTED   Barbiturates NONE DETECTED NONE DETECTED    Comment: (NOTE) DRUG SCREEN FOR MEDICAL PURPOSES ONLY.  IF CONFIRMATION IS NEEDED FOR ANY PURPOSE, NOTIFY LAB WITHIN 5 DAYS.  LOWEST DETECTABLE LIMITS FOR URINE DRUG SCREEN Drug Class                     Cutoff (ng/mL) Amphetamine and metabolites    1000 Barbiturate and metabolites    200 Benzodiazepine                 892 Tricyclics and metabolites     300 Opiates and metabolites        300 Cocaine and metabolites        300 THC                            50 Performed at [facility], [place] [address], [place], [place] [address]   Urinalysis, Routine w reflex microscopic     Status: Abnormal   Collection Time: 08/09/21  5:55 PM  Result Value Ref Range   Color, Urine STRAW (A) YELLOW   APPearance CLEAR CLEAR   Specific Gravity, Urine 1.021 1.005 - 1.030   pH 7.0 5.0 - 8.0   Glucose, UA >=500 (A) NEGATIVE mg/dL   Hgb urine dipstick NEGATIVE NEGATIVE   Bilirubin Urine NEGATIVE NEGATIVE   Ketones, ur 20 (A) NEGATIVE mg/dL   Protein, ur NEGATIVE NEGATIVE mg/dL   Nitrite NEGATIVE NEGATIVE   Leukocytes,Ua NEGATIVE NEGATIVE   RBC / HPF 0-5  0 - 5 RBC/hpf   WBC, UA 0-5 0 - 5 WBC/hpf   Bacteria, UA NONE SEEN NONE SEEN    Comment: Performed at [facility], [place] [address], [place], [place] [address]  CBG monitoring, ED     Status: Abnormal   Collection Time: 08/09/21  5:55 PM  Result Value Ref Range   Glucose-Capillary 283 (H) 70 -  99 mg/dL    Comment: Glucose reference range applies only to samples taken after fasting for at least 8 hours.  CBG monitoring, ED     Status: Abnormal   Collection Time: 08/09/21 10:36 PM  Result Value Ref Range   Glucose-Capillary 405 (H) 70 - 99 mg/dL    Comment: Glucose reference range applies only to samples taken after fasting for at least 8 hours.  CBG monitoring, ED     Status: Abnormal   Collection Time: 08/10/21  9:06 AM  Result Value Ref Range   Glucose-Capillary 260 (H) 70 - 99 mg/dL    Comment: Glucose reference range applies only to samples taken after fasting for at least 8 hours.   Comment 1 Notify RN    Comment 2 Document in Chart   Basic metabolic panel     Status: Abnormal   Collection Time: 08/10/21  9:16 AM  Result Value Ref Range   Sodium 135 135 - 145 mmol/L   Potassium 4.0 3.5 - 5.1 mmol/L   Chloride 103 98 - 111 mmol/L   CO2 25 22 - 32 mmol/L   Glucose, Bld 254 (H) 70 - 99 mg/dL    Comment: Glucose reference range applies only to samples taken after fasting for at least 8 hours.   BUN 10 6 - 20 mg/dL   Creatinine, Ser 0.57 (L) 0.61 - 1.24 mg/dL   Calcium 8.7 (L) 8.9 - 10.3 mg/dL   GFR, Estimated >60 >60 mL/min    Comment: (NOTE) Calculated using the CKD-EPI Creatinine Equation (2021)    Anion gap 7 5 - 15    Comment: Performed at [facility], [place] [address], [place], [place] [address]  CBC with Differential     Status: Abnormal   Collection Time: 08/10/21  9:16 AM  Result Value Ref Range   WBC 5.3 4.0 - 10.5 K/uL   RBC 5.18 4.22 - 5.81 MIL/uL   Hemoglobin 14.9 13.0 - 17.0 g/dL   HCT 44.8 39.0 - 52.0 %   MCV 86.5 80.0 -  100.0 fL   MCH 28.8 26.0 - 34.0 pg   MCHC 33.3 30.0 - 36.0 g/dL   RDW 12.6 11.5 - 15.5 %   Platelets 266 150 - 400 K/uL   nRBC 0.0 0.0 - 0.2 %   Neutrophils Relative % 52 %   Neutro Abs 2.7 1.7 - 7.7 K/uL   Lymphocytes Relative 33 %   Lymphs Abs 1.8 0.7 - 4.0 K/uL   Monocytes Relative 9 %   Monocytes Absolute 0.5 0.1 - 1.0 K/uL   Eosinophils Relative 2 %   Eosinophils Absolute 0.1 0.0 - 0.5 K/uL   Basophils Relative 1 %   Basophils Absolute 0.1 0.0 - 0.1 K/uL   Immature Granulocytes 3 %   Abs Immature Granulocytes 0.17 (H) 0.00 - 0.07 K/uL    Comment: Performed at [facility], [place] [address], [place], [place] [address]  Beta-hydroxybutyric acid     Status: Abnormal   Collection Time: 08/10/21  9:16 AM  Result Value Ref Range   Beta-Hydroxybutyric Acid 0.60 (H) 0.05 - 0.27 mmol/L    Comment: Performed at [name], [place] [address], [place], [place] [address]  Blood gas, venous (at [facility], not at [facility])     Status: None   Collection Time: 08/10/21  9:17 AM  Result Value Ref Range   pH, Ven 7.337 7.250 - 7.430   pCO2, Ven 50.5 44.0 -  60.0 mmHg   pO2, Ven 38.5 32.0 - 45.0 mmHg   Bicarbonate 26.3 20.0 - 28.0 mmol/L   Acid-Base Excess 0.2 0.0 - 2.0 mmol/L   O2 Saturation 65.6 %   Patient temperature 98.6     Comment: Performed at [facility], [place] [address] [place], [place] [address]    Medications:  Current Facility-Administered Medications  Medication Dose Route Frequency Provider Last Rate Last Admin   hydrOXYzine (ATARAX) tablet 25 mg  25 mg Oral TID PRN Lacretia Leigh, MD   25 mg at 08/09/21 2117   insulin aspart (novoLOG) injection 0-9 Units  0-9 Units Subcutaneous TID WC Phillips Grout, MD   5 Units at 08/10/21 0933   insulin glargine-yfgn [facility]) injection 30 Units  30 Units Subcutaneous BID Phillips Grout, MD   30 Units at 08/09/21 2238   Current Outpatient Medications  Medication Sig Dispense Refill    albuterol (VENTOLIN HFA) 108 (90 Base) MCG/ACT inhaler Inhale 2 puffs into the lungs every 6 (six) hours as needed for wheezing or shortness of breath.     blood glucose meter kit and supplies KIT Dispense based on patient and insurance preference. Use up to four times daily as directed. (FOR ICD-9 250.00, 250.01). 1 each 0   EPINEPHrine 0.3 mg/0.3 mL IJ SOAJ injection Inject 0.3 mg into the muscle once as needed for anaphylaxis (severe allergic reaction). (Patient not taking: Reported on 08/06/2021)     glucose blood (FREESTYLE TEST STRIPS) test strip Use as instructed 100 each 0   insulin aspart (NOVOLOG FLEXPEN) 100 UNIT/ML FlexPen Inject 1-15 Units into the skin 3 (three) times daily with meals. 1 unit/g of carbohydrate sliding scale. (Patient taking differently: Inject 1-15 Units into the skin See admin instructions. Inject 1-15 units into the skin three times a day with meals, per correction carb correction scale: 1 unit of insulin for every 15 grams of carbohydrates) 15 mL 6   insulin glargine (LANTUS) 100 UNIT/ML Solostar Pen Inject 30 Units into the skin 2 (two) times daily. (Patient taking differently: Inject 35 Units into the skin in the morning and at bedtime.) 15 mL 6   Insulin Pen Needle (ADVOCATE INSULIN PEN NEEDLES) 29G X 12.7MM MISC For type I diabetes 100 each 3   lisinopril (ZESTRIL) 5 MG tablet Take 5 mg by mouth daily. (Patient not taking: Reported on 08/08/2021)     ondansetron (ZOFRAN) 4 MG tablet Take 4 mg by mouth every 8 (eight) hours as needed for nausea or vomiting.     pantoprazole (PROTONIX) 40 MG tablet Take 40 mg by mouth 2 (two) times daily.     QUEtiapine (SEROQUEL) 50 MG tablet Take 50 mg by mouth at bedtime.     sertraline (ZOLOFT) 50 MG tablet Take 50 mg by mouth at bedtime.      Musculoskeletal: Strength & Muscle Tone: within normal limits Gait & Station: normal Patient leans: N/A  Psychiatric Specialty Exam:  Presentation  General Appearance: Fairly  Groomed  Eye Contact:Fair  Speech:Clear and Coherent; Normal Rate  Speech Volume:Normal  Handedness:Right   Mood and Affect  Mood:Depressed  Affect:Appropriate   Thought Process  Thought Processes:Coherent; Goal Directed; Linear  Descriptions of Associations:Intact  Orientation:Full (Time, Place and Person)  Thought Content:Logical  History of Schizophrenia/Schizoaffective disorder:No  Duration of Psychotic Symptoms:Greater than six months  Hallucinations:No data recorded Ideas of Reference:None  Suicidal Thoughts:No data recorded Homicidal Thoughts:No data recorded  Sensorium  Memory:Immediate Good; Recent Fair  Judgment:Intact  Insight:Present   Executive Functions  Concentration:Good  Attention Span:Good  [place]  Language:Good   Psychomotor Activity  Psychomotor Activity:No data recorded  Assets  Assets:Communication Skills; Desire for Improvement; Financial Resources/Insurance; Housing; Social Support   Sleep  Sleep:No data recorded   Physical Exam: Physical Exam Vitals and nursing note reviewed.  Constitutional:      Appearance: He is normal weight.  HENT:     Head: Normocephalic.     Nose: Nose normal.     Mouth/Throat:     Mouth: Mucous membranes are moist.     Pharynx: Oropharynx is clear.  Eyes:     Pupils: Pupils are equal, round, and reactive to light.  Cardiovascular:     Rate and Rhythm: Normal rate.     Pulses: Normal pulses.  Pulmonary:     Effort: Pulmonary effort is normal.  Abdominal:     General: Abdomen is flat.  Musculoskeletal:        General: Normal range of motion.     Cervical back: Normal range of motion.  Skin:    General: Skin is warm and dry.  Neurological:     Mental Status: He is oriented to person, place, and time. Mental status is at baseline.  Psychiatric:        Attention and Perception: Attention and perception normal.        Mood and Affect: Mood is  depressed. Affect is labile.        Speech: Speech normal.        Behavior: Behavior is cooperative.        Thought Content: Thought content includes suicidal ideation.        Cognition and Memory: Cognition and memory normal.        Judgment: Judgment is impulsive.   Review of Systems  Constitutional:  Positive for weight loss.  Psychiatric/Behavioral:  Positive for depression and suicidal ideas. The patient is nervous/anxious.   All other systems reviewed and are negative. Blood pressure 110/69, pulse 71, temperature 98.6 F (37 C), temperature source Oral, resp. rate 16, SpO2 98 %. There is no height or weight on file to calculate BMI.  Treatment Plan Summary: Daily contact with patient to assess and evaluate symptoms and progress in treatment, Medication management, and Plan to seek inpatient hospitalization for further observation, stabilization, and treatment.   Disposition: Recommend psychiatric Inpatient admission when medically cleared. Supportive therapy provided about ongoing stressors. Discussed crisis plan, support from social network, calling 911, coming to the Emergency Department, and calling Suicide Hotline.  This service was provided via telemedicine using a 2-way, interactive audio and video technology.  Names of all persons participating in this telemedicine service and their role in this encounter. Name: Oneida Alar Role: PMHNP  Name: Hampton Abbot Role: Attending MD  Name: [name] [name] Role: patient  Name:  Role:     Inda Merlin, NP 08/10/2021 10:46 AM

## 2021-08-10 NOTE — ED Notes (Signed)
Requesting sleep med early to "control racing thoughts about family".

## 2021-08-10 NOTE — ED Notes (Signed)
Patient calm cooperative. Taking meds as prescribed.

## 2021-08-10 NOTE — ED Provider Notes (Signed)
Emergency Medicine Observation Re-evaluation Note  [name] [name]. is a 21 y.o. male, seen on rounds today.  Pt initially presented to the ED for complaints of Suicidal and Hyperglycemia Currently, the patient is calm.  Physical Exam  BP 110/69 (BP Location: Left Arm)    Pulse 71    Temp 98.6 F (37 C) (Oral)    Resp 16    SpO2 98%  Physical Exam General: awake and alert Cardiac: rrr Lungs: cta b Psych: calm  ED Course / MDM  EKG:   I have reviewed the labs performed to date as well as medications administered while in observation.  Recent changes in the last 24 hours include BHH will reassess later today.  Pt has a hx of DM1 with DKA.  He was in mild DKA yesterday due to not taking insulin.  Now that he's been on insulin, acidosis has cleared and labs look much better.   He is medically cleared.  Plan  Current plan is for TTS eval.  [name] [name]. is not under involuntary commitment.     Jacalyn Lefevre, MD 08/10/21 1028

## 2021-08-11 LAB — CBG MONITORING, ED
Glucose-Capillary: 206 mg/dL — ABNORMAL HIGH (ref 70–99)
Glucose-Capillary: 371 mg/dL — ABNORMAL HIGH (ref 70–99)
Glucose-Capillary: 335 mg/dL — ABNORMAL HIGH (ref 70–99)
Glucose-Capillary: 251 mg/dL — ABNORMAL HIGH (ref 70–99)

## 2021-08-11 MED ORDER — INSULIN ASPART 100 UNIT/ML IJ SOLN
4.0000 [IU] | Freq: Three times a day (TID) | INTRAMUSCULAR | Status: DC
  Administered 2021-08-11 – 2021-08-12 (×4): 4 [IU] via SUBCUTANEOUS
  Filled 2021-08-11: qty 0.04

## 2021-08-11 MED ORDER — INSULIN ASPART 100 UNIT/ML IJ SOLN
0.0000 [IU] | Freq: Every day | INTRAMUSCULAR | Status: DC
  Administered 2021-08-11: 3 [IU] via SUBCUTANEOUS
  Filled 2021-08-11: qty 0.05

## 2021-08-11 MED ORDER — INSULIN ASPART 100 UNIT/ML IJ SOLN
0.0000 [IU] | Freq: Three times a day (TID) | INTRAMUSCULAR | Status: DC
  Administered 2021-08-11: 15 [IU] via SUBCUTANEOUS
  Administered 2021-08-11: 11 [IU] via SUBCUTANEOUS
  Administered 2021-08-12: 8 [IU] via SUBCUTANEOUS
  Administered 2021-08-12: 2 [IU] via SUBCUTANEOUS
  Filled 2021-08-11: qty 0.15

## 2021-08-11 NOTE — ED Notes (Signed)
2200 dose seroquel  given at 1830 per previous shift RN

## 2021-08-11 NOTE — ED Notes (Signed)
[name] [name] has be very quiet with minimal interaction with staff or peers possible responding to internal stimuli. He has been cooperative and complaint with med pass and HS CBG=264  30 unit insulin given per order.

## 2021-08-11 NOTE — ED Provider Notes (Addendum)
Emergency Medicine Observation Re-evaluation Note  [name] [name]. is a 21 y.o. male, seen on rounds today.  Pt initially presented to the ED for complaints of Suicidal and Hyperglycemia Currently, the patient is resting.  Physical Exam  BP 128/77 (BP Location: Right Arm)    Pulse 88    Temp 98.7 F (37.1 C) (Oral)    Resp 18    SpO2 100%  Physical Exam General: resting Cardiac: rrr Lungs: cta b Psych: calm  ED Course / MDM  EKG:   I have reviewed the labs performed to date as well as medications administered while in observation.  Recent changes in the last 24 hours include Psych eval recommending inpatient placement.  BS are stable.  Pt is compliant with meds here.  Plan  Current plan is for inpatient placement.  Continue to monitor blood sugars.  DM coordinator consulted to make sure we are managing sugars appropriately.  [name] Emery. is not under involuntary commitment.   DM coordinator did see pt and recommended changes.  I have made these changes.     [name] Lefevre, MD 08/11/21 6144    [name] Lefevre, MD 08/11/21 1031

## 2021-08-11 NOTE — ED Notes (Signed)
Remains asleep  no broken or disturbed sleep patterns noted respiration 14 and easy skin color appropriate for ethnicity

## 2021-08-11 NOTE — Progress Notes (Signed)
Inpatient Diabetes Program Recommendations  AACE/ADA: New Consensus Statement on Inpatient Glycemic Control (2015)  Target Ranges:  Prepandial:   less than 140 mg/dL      Peak postprandial:   less than 180 mg/dL (1-2 hours)      Critically ill patients:  140 - 180 mg/dL   Lab Results  Component Value Date   GLUCAP 206 (H) 08/11/2021   HGBA1C 10.5 (H) 11/09/2020    Review of Glycemic Control  Diabetes history: type 1 Outpatient Diabetes medications: Lantus 35-40 units BID, Novolog 0-15 units sliding scale TID, Novolog 1:15 grams CHO Current orders for Inpatient glycemic control: Semglee 30 units BID, Novolog SENSITIVE (0-9 units) TID  Inpatient Diabetes Program Recommendations:   Received diabetes coordinator consult. Our inpatient diabetes team is very familiar with this patient due to frequent admissions.   Recommend continuing Semglee 30 units BID, increasing Novolog correction scale to MODERATE (0-15 units) TID, add Novolog 0-5 units HS scale, and add Novolog 4 units TID with meals if eating at least 50% of meal.  Titrate dosages as needed.   Smith Mince RN BSN CDE Diabetes Coordinator Pager: [phone]  8am-5pm

## 2021-08-11 NOTE — ED Notes (Signed)
Patient alert this shift. Patient cooperative with care. Patient medication compliant this shift. Patient endorsed  depression. Denied suicidal ideation at this time. Patient has endorsed anxiety during the shift. Given Atarax 25 mg x 2 this shift.

## 2021-08-11 NOTE — Progress Notes (Signed)
npatient Behavioral Health Placement  Pt meets inpatient criteria per Maxie Barb, NP.  There are no appropriate beds at [facility] per Day shift [facility] AC Tommy Medal, Charity fundraiser. Referral was sent to the following facilities;   Destination Service Provider Address Phone Fax  [facility]  [address] [place] [address] [phone]  [facility]  [address] [place], [place] [address] [phone]  [facility]  [address] [place] [address] [phone]  [facility]  [address] [place], [place] [address] [phone]  [facility]  [address] [name] [address] [phone]  [name]  [address] [place] [address] [phone]  [facility]  [address] [place] [address] [phone]  [facility]  [address], [place] [address] [phone]  [facility]  [address], [place] [address] [phone]  [facility]  [address] [place] [address] [phone]  [facility]  [address], [place] [address] [phone]  [facility]  [address] [place] [address] [phone]  [facility]  [address] [place] [address] [phone]  [facility]  [address] [place] [address] [phone]  [facility]  [address] [place] [address] [phone]  [facility]  [address] [place] [address] [phone]     Situation ongoing,  CSW will follow  up.   Maryjean Ka, MSW, [facility] 08/11/2021 5:53 PM

## 2021-08-12 ENCOUNTER — Inpatient Hospital Stay (HOSPITAL_COMMUNITY)
Admission: AD | Admit: 2021-08-12 | Discharge: 2021-08-15 | DRG: 885 | Disposition: A | Discharge status: Home / Self Care | Payer: Medicaid Other | Source: Intra-hospital | Attending: Psychiatry | Admitting: Psychiatry

## 2021-08-12 ENCOUNTER — Encounter (HOSPITAL_COMMUNITY): Payer: Self-pay | Admitting: Family

## 2021-08-12 ENCOUNTER — Other Ambulatory Visit: Payer: Self-pay

## 2021-08-12 DIAGNOSIS — B182 Chronic viral hepatitis C: Secondary | ICD-10-CM | POA: Diagnosis present

## 2021-08-12 DIAGNOSIS — F603 Borderline personality disorder: Secondary | ICD-10-CM | POA: Diagnosis present

## 2021-08-12 DIAGNOSIS — F1510 Other stimulant abuse, uncomplicated: Secondary | ICD-10-CM | POA: Diagnosis present

## 2021-08-12 DIAGNOSIS — F17210 Nicotine dependence, cigarettes, uncomplicated: Secondary | ICD-10-CM | POA: Diagnosis present

## 2021-08-12 DIAGNOSIS — F459 Somatoform disorder, unspecified: Secondary | ICD-10-CM | POA: Diagnosis present

## 2021-08-12 DIAGNOSIS — F4310 Post-traumatic stress disorder, unspecified: Secondary | ICD-10-CM | POA: Diagnosis present

## 2021-08-12 DIAGNOSIS — Z91013 Allergy to seafood: Secondary | ICD-10-CM

## 2021-08-12 DIAGNOSIS — F913 Oppositional defiant disorder: Secondary | ICD-10-CM | POA: Diagnosis present

## 2021-08-12 DIAGNOSIS — F451 Undifferentiated somatoform disorder: Secondary | ICD-10-CM | POA: Diagnosis present

## 2021-08-12 DIAGNOSIS — F509 Eating disorder, unspecified: Secondary | ICD-10-CM | POA: Diagnosis present

## 2021-08-12 DIAGNOSIS — Z62810 Personal history of physical and sexual abuse in childhood: Secondary | ICD-10-CM | POA: Diagnosis present

## 2021-08-12 DIAGNOSIS — K5000 Crohn's disease of small intestine without complications: Secondary | ICD-10-CM | POA: Diagnosis present

## 2021-08-12 DIAGNOSIS — Z62811 Personal history of psychological abuse in childhood: Secondary | ICD-10-CM | POA: Diagnosis present

## 2021-08-12 DIAGNOSIS — Z20822 Contact with and (suspected) exposure to covid-19: Secondary | ICD-10-CM | POA: Diagnosis present

## 2021-08-12 DIAGNOSIS — G40309 Generalized idiopathic epilepsy and epileptic syndromes, not intractable, without status epilepticus: Secondary | ICD-10-CM | POA: Diagnosis present

## 2021-08-12 DIAGNOSIS — Z794 Long term (current) use of insulin: Secondary | ICD-10-CM | POA: Diagnosis not present

## 2021-08-12 DIAGNOSIS — D573 Sickle-cell trait: Secondary | ICD-10-CM | POA: Diagnosis present

## 2021-08-12 DIAGNOSIS — F411 Generalized anxiety disorder: Secondary | ICD-10-CM | POA: Diagnosis not present

## 2021-08-12 DIAGNOSIS — G40409 Other generalized epilepsy and epileptic syndromes, not intractable, without status epilepticus: Secondary | ICD-10-CM | POA: Diagnosis present

## 2021-08-12 DIAGNOSIS — F1110 Opioid abuse, uncomplicated: Secondary | ICD-10-CM | POA: Diagnosis present

## 2021-08-12 DIAGNOSIS — Z5902 Unsheltered homelessness: Secondary | ICD-10-CM | POA: Diagnosis not present

## 2021-08-12 DIAGNOSIS — Z9151 Personal history of suicidal behavior: Secondary | ICD-10-CM

## 2021-08-12 DIAGNOSIS — G40209 Localization-related (focal) (partial) symptomatic epilepsy and epileptic syndromes with complex partial seizures, not intractable, without status epilepticus: Secondary | ICD-10-CM | POA: Diagnosis not present

## 2021-08-12 DIAGNOSIS — F902 Attention-deficit hyperactivity disorder, combined type: Secondary | ICD-10-CM | POA: Diagnosis present

## 2021-08-12 DIAGNOSIS — F952 Tourette's disorder: Secondary | ICD-10-CM | POA: Diagnosis present

## 2021-08-12 DIAGNOSIS — F250 Schizoaffective disorder, bipolar type: Secondary | ICD-10-CM | POA: Diagnosis present

## 2021-08-12 DIAGNOSIS — Z9114 Patient's other noncompliance with medication regimen: Secondary | ICD-10-CM | POA: Diagnosis not present

## 2021-08-12 DIAGNOSIS — R45851 Suicidal ideations: Secondary | ICD-10-CM | POA: Diagnosis present

## 2021-08-12 DIAGNOSIS — F259 Schizoaffective disorder, unspecified: Secondary | ICD-10-CM | POA: Diagnosis present

## 2021-08-12 DIAGNOSIS — F54 Psychological and behavioral factors associated with disorders or diseases classified elsewhere: Secondary | ICD-10-CM | POA: Diagnosis present

## 2021-08-12 DIAGNOSIS — Z8249 Family history of ischemic heart disease and other diseases of the circulatory system: Secondary | ICD-10-CM

## 2021-08-12 DIAGNOSIS — Z83438 Family history of other disorder of lipoprotein metabolism and other lipidemia: Secondary | ICD-10-CM

## 2021-08-12 DIAGNOSIS — E1065 Type 1 diabetes mellitus with hyperglycemia: Secondary | ICD-10-CM | POA: Diagnosis present

## 2021-08-12 DIAGNOSIS — Z833 Family history of diabetes mellitus: Secondary | ICD-10-CM

## 2021-08-12 DIAGNOSIS — Z79899 Other long term (current) drug therapy: Secondary | ICD-10-CM | POA: Diagnosis not present

## 2021-08-12 DIAGNOSIS — F1599 Other stimulant use, unspecified with unspecified stimulant-induced disorder: Secondary | ICD-10-CM | POA: Diagnosis not present

## 2021-08-12 DIAGNOSIS — E1042 Type 1 diabetes mellitus with diabetic polyneuropathy: Secondary | ICD-10-CM | POA: Diagnosis present

## 2021-08-12 DIAGNOSIS — F17200 Nicotine dependence, unspecified, uncomplicated: Secondary | ICD-10-CM | POA: Diagnosis not present

## 2021-08-12 LAB — GLUCOSE, CAPILLARY
Glucose-Capillary: 451 mg/dL — ABNORMAL HIGH (ref 70–99)
Glucose-Capillary: 463 mg/dL — ABNORMAL HIGH (ref 70–99)
Glucose-Capillary: 475 mg/dL — ABNORMAL HIGH (ref 70–99)
Glucose-Capillary: 497 mg/dL — ABNORMAL HIGH (ref 70–99)
Glucose-Capillary: 505 mg/dL (ref 70–99)
Glucose-Capillary: 442 mg/dL — ABNORMAL HIGH (ref 70–99)
Glucose-Capillary: 382 mg/dL — ABNORMAL HIGH (ref 70–99)

## 2021-08-12 LAB — CBG MONITORING, ED
Glucose-Capillary: 177 mg/dL — ABNORMAL HIGH (ref 70–99)
Glucose-Capillary: 69 mg/dL — ABNORMAL LOW (ref 70–99)
Glucose-Capillary: 137 mg/dL — ABNORMAL HIGH (ref 70–99)
Glucose-Capillary: 291 mg/dL — ABNORMAL HIGH (ref 70–99)

## 2021-08-12 LAB — RESP PANEL BY RT-PCR (FLU A&B, COVID) ARPGX2
Influenza A by PCR: NEGATIVE
Influenza B by PCR: NEGATIVE
SARS Coronavirus 2 by RT PCR: NEGATIVE

## 2021-08-12 MED ORDER — NICOTINE POLACRILEX 2 MG MT GUM
2.0000 mg | CHEWING_GUM | OROMUCOSAL | Status: DC | PRN
  Administered 2021-08-12 – 2021-08-13 (×3): 2 mg via ORAL
  Filled 2021-08-12 (×3): qty 1

## 2021-08-12 MED ORDER — INSULIN ASPART 100 UNIT/ML IJ SOLN
0.0000 [IU] | Freq: Three times a day (TID) | INTRAMUSCULAR | Status: DC
  Administered 2021-08-13: 15 [IU] via SUBCUTANEOUS

## 2021-08-12 MED ORDER — INSULIN ASPART 100 UNIT/ML IJ SOLN: 0.0000 [IU] | Freq: Every day | INTRAMUSCULAR | Status: DC

## 2021-08-12 MED ORDER — INSULIN ASPART 100 UNIT/ML IJ SOLN
0.0000 [IU] | Freq: Every day | INTRAMUSCULAR | Status: DC
  Administered 2021-08-13: 2 [IU] via SUBCUTANEOUS
  Administered 2021-08-14: 3 [IU] via SUBCUTANEOUS

## 2021-08-12 MED ORDER — QUETIAPINE FUMARATE 50 MG PO TABS
50.0000 mg | ORAL_TABLET | Freq: Every day | ORAL | Status: DC
  Filled 2021-08-12 (×3): qty 1

## 2021-08-12 MED ORDER — INSULIN GLARGINE-YFGN 100 UNIT/ML ~~LOC~~ SOLN
30.0000 [IU] | Freq: Two times a day (BID) | SUBCUTANEOUS | Status: DC
  Administered 2021-08-12 – 2021-08-13 (×2): 30 [IU] via SUBCUTANEOUS

## 2021-08-12 MED ORDER — OLANZAPINE 10 MG PO TBDP
ORAL_TABLET | ORAL | Status: AC
  Filled 2021-08-12: qty 1

## 2021-08-12 MED ORDER — HYDROXYZINE HCL 25 MG PO TABS
25.0000 mg | ORAL_TABLET | Freq: Three times a day (TID) | ORAL | Status: DC | PRN
  Administered 2021-08-12 – 2021-08-13 (×2): 25 mg via ORAL
  Filled 2021-08-12 (×2): qty 1

## 2021-08-12 MED ORDER — ALUM & MAG HYDROXIDE-SIMETH 200-200-20 MG/5ML PO SUSP: 30.0000 mL | ORAL | Status: DC | PRN

## 2021-08-12 MED ORDER — ACETAMINOPHEN 325 MG PO TABS: 650.0000 mg | ORAL_TABLET | Freq: Four times a day (QID) | ORAL | Status: DC | PRN

## 2021-08-12 MED ORDER — LORAZEPAM 1 MG PO TABS: 1.0000 mg | ORAL_TABLET | ORAL | Status: DC | PRN

## 2021-08-12 MED ORDER — INSULIN ASPART 100 UNIT/ML IJ SOLN
4.0000 [IU] | Freq: Three times a day (TID) | INTRAMUSCULAR | Status: DC
  Administered 2021-08-12: 4 [IU] via SUBCUTANEOUS

## 2021-08-12 MED ORDER — INSULIN ASPART 100 UNIT/ML IJ SOLN
15.0000 [IU] | Freq: Three times a day (TID) | INTRAMUSCULAR | Status: DC
  Administered 2021-08-13: 10 [IU] via SUBCUTANEOUS
  Administered 2021-08-13: 15 [IU] via SUBCUTANEOUS

## 2021-08-12 MED ORDER — OLANZAPINE 10 MG PO TBDP
10.0000 mg | ORAL_TABLET | Freq: Three times a day (TID) | ORAL | Status: DC | PRN
  Administered 2021-08-12 – 2021-08-14 (×2): 10 mg via ORAL
  Filled 2021-08-12: qty 1

## 2021-08-12 MED ORDER — ZIPRASIDONE MESYLATE 20 MG IM SOLR: 20.0000 mg | INTRAMUSCULAR | Status: DC | PRN

## 2021-08-12 MED ORDER — MAGNESIUM HYDROXIDE 400 MG/5ML PO SUSP: 30.0000 mL | Freq: Every day | ORAL | Status: DC | PRN

## 2021-08-12 MED ORDER — INSULIN ASPART 100 UNIT/ML IJ SOLN
0.0000 [IU] | Freq: Three times a day (TID) | INTRAMUSCULAR | Status: DC
  Administered 2021-08-12: 15 [IU] via SUBCUTANEOUS

## 2021-08-12 MED ORDER — SERTRALINE HCL 50 MG PO TABS
50.0000 mg | ORAL_TABLET | Freq: Every day | ORAL | Status: DC
  Administered 2021-08-13: 50 mg via ORAL
  Filled 2021-08-12 (×2): qty 1

## 2021-08-12 NOTE — BH Assessment (Addendum)
[facility] Assessment Progress Note   Per Caryn Bee, NP, this pt requires psychiatric hospitalization at this time.  Danika, RN, [facility] has assigned pt to [facility] Rm 304-2 to the service of Dr Loleta Chance.  [facility] will be ready to receive pt after 12:00, but a new Covid-19 test, which has been ordered, must result negative first.  Pt has signed Voluntary Admission and Consent for Treatment, as well as Consent to Release Information to his wife, and signed forms have been faxed to [facility].  EDP Kristine Royal, MD and pt's nurse, Casimiro Needle, have been notified, and Casimiro Needle agrees to send original paperwork along with pt via [facility], and to call report to [phone] when the time comes.  Doylene Canning, [place] Behavioral Health Coordinator [phone]   Addendum:  Prior to pt being accepted to [facility], this writer called [facility] to schedule an intake appointment for their ACT Team.  Call was placed at 10:55 and I spoke to [place].  She has scheduled pt for Thursday, August 22, 2021 at 11:00 am.  [facility] social workers have been notified.  Contact information is:       [facility]      [address], [address]      [place], [place] [address]      [phone]  Doylene Canning, [place] Behavioral Health Coordinator [phone]

## 2021-08-12 NOTE — ED Notes (Signed)
Breakfast tray given. °

## 2021-08-12 NOTE — ED Provider Notes (Signed)
Patient awoke diaphoretic per nursing staff, found to have a blood sugar of 69.  He was given orange juice and a sandwich.  He did not eat dinner due to being a picky eater.   Tilden Fossa, MD 08/12/21 [phone]

## 2021-08-12 NOTE — Progress Notes (Signed)
Inpatient Diabetes Program Recommendations  AACE/ADA: New Consensus Statement on Inpatient Glycemic Control (2015)  Target Ranges:  Prepandial:   less than 140 mg/dL      Peak postprandial:   less than 180 mg/dL (1-2 hours)      Critically ill patients:  140 - 180 mg/dL   Lab Results  Component Value Date   GLUCAP 177 (H) 08/12/2021   HGBA1C 10.5 (H) 11/09/2020    Review of Glycemic Control  Latest Reference Range & Units 08/11/21 13:07 08/11/21 17:39 08/11/21 21:04 08/12/21 00:31 08/12/21 01:24  Glucose-Capillary 70 - 99 mg/dL 233 (H) 007 (H) 622 (H) 69 (L) 177 (H)  (H): Data is abnormally high (L): Data is abnormally low Diabetes history: DM1 Outpatient Diabetes medications: Lantus 35 units BID, Novolog 1:15 CHO ratio Current orders for Inpatient glycemic control: Semglee 30 units BID, Novolog 0-15 units TID with meals, Novolog 4 units TID    Inpatient Diabetes Program Recommendations:    Noted hypoglycemia this AM of 69 mg/dL assuming due to patient not eating dinner, however received meal coverage. Note: Patient ONLY to receive coverage if consuming >50% of meal.   Consider increasing Semglee to 38 units BID.  Thanks,  Lujean Rave, MSN, RNC-OB Diabetes Coordinator [phone] (8a-5p)

## 2021-08-12 NOTE — ED Provider Notes (Signed)
Emergency Medicine Observation Re-evaluation Note  [name] [name]. is a 21 y.o. male, seen on rounds today at 0700.  Pt initially presented to the ED for complaints of Suicidal and Hyperglycemia Currently, the patient is resting comfortably.  Physical Exam  BP 120/63 (BP Location: Right Arm)    Pulse 88    Temp 98 F (36.7 C) (Oral)    Resp 20    SpO2 100%  Physical Exam General: NAD   ED Course / MDM  EKG:   I have reviewed the labs performed to date as well as medications administered while in observation.  Recent changes in the last 24 hours include mild hypoglycemia after poor PO intake.  Plan  Current plan is for inpatient treatment.  [name] [name]. is not under involuntary commitment.     Wynetta Fines, MD 08/12/21 1054

## 2021-08-12 NOTE — ED Notes (Signed)
Patient Type I Diabetic. He woke up sweating and mild shaking of hands. BG 69. MD notified  Pt given orange juice and sandwich. Will recheck in about 45 minutes

## 2021-08-12 NOTE — Progress Notes (Addendum)
Notified by nursing staff that patient's CBG was 442 mg/dL at 6606 earlier this evening.  Patient has documented extensive medical history of poorly controlled/uncontrolled type 1 diabetes mellitus and DKA.  Per chart review, patient was evaluated by diabetes coordinator in the emergency department earlier this morning on 08/12/2021.  Per chart review of 08/12/2021 diabetes coordinator recommendation documentation, patient's glycemic control regimen that he was receiving in the ED at that time of 08/12/2021 evaluation included Semglee 30 units twice daily, NovoLog 4 units 3 times daily, and NovoLog sliding scale 0 to 15 units 3 times daily with meals and recommendation was made by diabetes coordinator to consider increasing dosage of Semglee from 30 units twice daily to 38 units twice daily at that time.  Per chart review, it appears that upon patient's arrival to [facility]) from [facility] emergency department earlier today, it was initially recommended for the patient to be transferred back to [facility] emergency department due to patient having hyperglycemia owith blood glucose of 505 mg/dL, EMS then arrived to [facility] to transfer patient back to the ED, but patient refused transfer to the ED for further hyperglycemia work-up. Per Dr. Loleta Chance 08/12/21 [facility] note from earlier this eveing:   "Called ER Dr. Effie Shy to notify that pt returning due to high blood sugar 505 , then 19 units insulin then 597 on recheck.  Patient went to cafeteria and refusing to follow diabetic diet, refusing glucose recheck, difficult to redirect. As patient voluntary, interventions limited. EMS contacted to come to unit.  EMS unable to convince patient to return to ER for BG management Patient requested 72 hour but then refused to sign, very upset about diabetic diet  Patient denied SI intent with regards to anger, recent behaviors on the unt/remarks, stated he wanted to leave because he did not like diabetic diet  and did not like the way previous and current hospital was managing his diabetes. Stated he wanted to follow home regimen Patient eventually agreed to [facility] check and insulin management, though blood sugars 442 Patient stated he usually takes 22 units insulin with meals. Since meals here are different from at home, advised to split the difference and he agreed to decrease to 15 units would be okay for meals. This seemed to be okay for him and he was less angry and was able to calm down."  Patient's CBG trend today (08/12/2021) includes:  -442 mg/dL at 3016  -010 mg/dL at 9323  -557 mg/dL at 3220  -254 mg/dL at 2706  -237 mg/dL at 6283  -151 mg/dL 7616  -073 mg/dL at 7106  -269 mg/dL at 4854  -627 mg/dL at 0350  -69 mg/dL at 0938 (patient's previous CBG value on 08/11/2021 at 2104 was 251 mg/dL prior to this value of 69 mg/dL)  Per chart review, patient currently has order for Semglee 30 units subcutaneous twice daily that was ordered for patient previously in the emergency department (see details above). Per chart review, it appears that patient's previous order of NovoLog 4 units 3 times daily with meals from the ED noted above was discontinued and order was placed to initiate NovoLog 15 units 3 times daily with meals at [facility].  Per chart review, it also appears that patient's previous order of NovoLog sliding scale 0-15 units 3 times daily with meals from the ED noted above was changed to NovoLog sliding scale 0 to 20 units 3 times daily with meals at [facility].  In summary, patient's current [facility]  diabetes medication regimen consists of the following:  -NovoLog sliding scale 0 to 20 units 3 times daily with meals (see orders/MAR for sliding scale details)  -NovoLog 15 units 3 times daily with meals  -Semglee 30 units twice daily  Patient's CBG rechecked at 2210 to be 382 mg/dL.  Per chart review, patient does not have at bedtime insulin coverage ordered at this time.  Will place order to initiate NovoLog  sliding scale 0 to 5 units daily at bedtime (see orders/MAR for sliding scale details).  Based on patient's current blood glucose value is 382 mg/dL and the sliding scale criteria for patient's bedtime sliding scale NovoLog, we will give 5 units of NovoLog at this time.   Update from nursing staff: Notified by nursing staff that patient refused to take any NovoLog or insulin at this time.  Per nursing staff, patient is asymptomatic at this time.  Patient seen and examined face-to-face by this provider.  Upon exam, patient is resting comfortably in his bed, in no acute distress.  Patient is not diaphoretic or tremulous on exam.  Patient denies any headache, lightheadedness, dizziness, vision changes, chest pain, shortness of breath, abdominal pain, nausea, vomiting, numbness, tingling, GU symptoms, or any additional physical symptoms on exam at this time.  Patient refused to let this provider perform heart or lung auscultation or conduct further physical examination of the patient.  Patient is alert and oriented x4 and does not appear to be exhibiting altered mental status at this time.  Although patient does have hyperglycemia (see values noted above), Patient is not displaying signs/symptoms of DKA or hyperglycemia at this time and based on patient's presentation, my evaluation of the patient, and patient's lack of physical symptoms at this time, patient does not appear to be experiencing an emergent medical condition at this time.  Thus, no further emergent medical evaluation necessary at this time and patient to remain at [facility] at this time.  Recommend that starting on the morning of 08/13/2021, patient take his scheduled Semglee insulin as currently ordered and for patient to take his currently ordered mealtime and bedtime sliding scale NovoLog depending on patient's future CBG values and recommended sliding scale NovoLog dosage values based on CBG values.  Patient to notify nursing staff if he develops any  physical symptoms or if any issues arise.  Per chart review of 08/12/2020 diabetes coordinator recommendation note, patient's outpatient diabetes medications were documented to include Lantus 35 units twice daily and NovoLog (Dosage not documented in this note) 1:15 CHO ratio.  Per chart review with patient's PTA meds (which were verified by pharmacy on 08/10/2021), patient's home diabetes medications include NovoLog (1 to 15 units 3 times daily with meals per carb correction scale of 1 unit of insulin for every 15 g of carbs) and Lantus 35 units twice daily.  With that being said, based on patient's documented history of poorly controlled type 1 diabetes and DKA, there is potential that patient may not actually be taking his diabetes medications at home or taking them as prescribed.  Will defer to dayshift treatment team at this time regarding further recommendations for patient's diabetes medication management.  Per chart review, additional diabetes coordinator consult has been placed.

## 2021-08-12 NOTE — ED Notes (Signed)
Accepted to [facility] 304-2 to Dr Berdine Addison.  Fr[facility] will be ready to receive him after 12:00, but we also need a new negative Covid result...test has been ordered.  Voluntary, consents have been signed, will go via [employer].  Please call nursing report to [phone].

## 2021-08-12 NOTE — Tx Team (Signed)
Initial Treatment Plan 08/12/2021 3:56 PM [name] [name]. EQA:[id number]    PATIENT STRESSORS: Financial difficulties   Health problems   Marital or family conflict   Medication change or noncompliance   Occupational concerns   Substance abuse     PATIENT STRENGTHS: Capable of independent living  Motivation for treatment/growth    PATIENT IDENTIFIED PROBLEMS: Substance abuse   Depression  AVH (not currently)  Anger problems   No job and homeless  Family conflict            DISCHARGE CRITERIA:  Adequate post-discharge living arrangements Improved stabilization in mood, thinking, and/or behavior Verbal commitment to aftercare and medication compliance  PRELIMINARY DISCHARGE PLAN: Attend PHP/IOP Attend 12-step recovery group Placement in alternative living arrangements  PATIENT/FAMILY INVOLVEMENT: This treatment plan has been presented to and reviewed with the patient, [name] [name].  The patient and family have been given the opportunity to ask questions and make suggestions.  Sofie Hartigan, RN 08/12/2021, 3:56 PM

## 2021-08-12 NOTE — Progress Notes (Addendum)
Called ER Dr. Effie Shy to notify that pt returning due to high blood sugar 505 , then 19 units insulin then 597 on recheck.  Patient went to cafeteria and refusing to follow diabetic diet, refusing glucose recheck, difficult to redirect. As patient voluntary, interventions limited. EMS contacted to come to unit.  EMS unable to convince patient to return to ER for BG management Patient requested 72 hour but then refused to sign, very upset about diabetic diet  Patient denied SI intent with regards to anger, recent behaviors on the unt/remarks, stated he wanted to leave because he did not like diabetic diet and did not like the way previous and current hospital was managing his diabetes. Stated he wanted to follow home regimen Patient eventually agreed to [facility] check and insulin management, though blood sugars 442 Patient stated he usually takes 22 units insulin with meals. Since meals here are different from at home, advised to split the difference and he agreed to decrease to 15 units would be okay for meals. This seemed to be okay for him and he was less angry and was able to calm down.

## 2021-08-12 NOTE — ED Notes (Signed)
Safe transport contacted, there was no answer, a message for return call was left. Awaiting return call.
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
## 2021-08-13 LAB — TSH: TSH: 0.917 u[IU]/mL (ref 0.350–4.500)

## 2021-08-13 LAB — GLUCOSE, CAPILLARY
Glucose-Capillary: 328 mg/dL — ABNORMAL HIGH (ref 70–99)
Glucose-Capillary: 313 mg/dL — ABNORMAL HIGH (ref 70–99)
Glucose-Capillary: 127 mg/dL — ABNORMAL HIGH (ref 70–99)
Glucose-Capillary: 289 mg/dL — ABNORMAL HIGH (ref 70–99)
Glucose-Capillary: 356 mg/dL — ABNORMAL HIGH (ref 70–99)
Glucose-Capillary: 208 mg/dL — ABNORMAL HIGH (ref 70–99)

## 2021-08-13 LAB — LIPID PANEL
Cholesterol: 156 mg/dL (ref 0–200)
HDL: 62 mg/dL (ref >40)
LDL Cholesterol: 47 mg/dL (ref 0–99)
Total CHOL/HDL Ratio: 2.5 RATIO
Triglycerides: 235 mg/dL — ABNORMAL HIGH (ref <150)
VLDL: 47 mg/dL — ABNORMAL HIGH (ref 0–40)

## 2021-08-13 LAB — HEMOGLOBIN A1C
Hgb A1c MFr Bld: 8.8 % — ABNORMAL HIGH (ref 4.8–5.6)
Mean Plasma Glucose: 205.86 mg/dL

## 2021-08-13 MED ORDER — CLONIDINE HCL 0.1 MG PO TABS: 0.1000 mg | ORAL_TABLET | Freq: Four times a day (QID) | ORAL | Status: DC | PRN

## 2021-08-13 MED ORDER — INSULIN ASPART 100 UNIT/ML IJ SOLN
8.0000 [IU] | Freq: Three times a day (TID) | INTRAMUSCULAR | Status: DC
  Administered 2021-08-13 – 2021-08-14 (×4): 8 [IU] via SUBCUTANEOUS

## 2021-08-13 MED ORDER — INSULIN ASPART 100 UNIT/ML IJ SOLN
0.0000 [IU] | Freq: Three times a day (TID) | INTRAMUSCULAR | Status: DC
  Administered 2021-08-13 – 2021-08-14 (×2): 15 [IU] via SUBCUTANEOUS
  Administered 2021-08-14: 5 [IU] via SUBCUTANEOUS
  Administered 2021-08-14: 15 [IU] via SUBCUTANEOUS
  Administered 2021-08-15: 8 [IU] via SUBCUTANEOUS

## 2021-08-13 MED ORDER — CHLORPROMAZINE HCL 10 MG PO TABS: 10.0000 mg | ORAL_TABLET | Freq: Two times a day (BID) | ORAL | Status: DC

## 2021-08-13 MED ORDER — CHLORPROMAZINE HCL 25 MG PO TABS
25.0000 mg | ORAL_TABLET | Freq: Every day | ORAL | Status: AC
Start: 2021-08-13 — End: 2021-08-13
  Administered 2021-08-13: 25 mg via ORAL
  Filled 2021-08-13 (×2): qty 1

## 2021-08-13 MED ORDER — METHOCARBAMOL 500 MG PO TABS: 500.0000 mg | ORAL_TABLET | Freq: Three times a day (TID) | ORAL | Status: DC | PRN

## 2021-08-13 MED ORDER — OLANZAPINE 5 MG PO TABS
5.0000 mg | ORAL_TABLET | Freq: Two times a day (BID) | ORAL | Status: DC
Start: 2021-08-13 — End: 2021-08-13

## 2021-08-13 MED ORDER — HYDROXYZINE HCL 25 MG PO TABS
25.0000 mg | ORAL_TABLET | Freq: Four times a day (QID) | ORAL | Status: DC | PRN
Start: 2021-08-13 — End: 2021-08-15
  Administered 2021-08-14 (×2): 25 mg via ORAL
  Filled 2021-08-13 (×2): qty 1

## 2021-08-13 MED ORDER — CHLORPROMAZINE HCL 10 MG PO TABS
10.0000 mg | ORAL_TABLET | Freq: Two times a day (BID) | ORAL | Status: DC
  Administered 2021-08-14 – 2021-08-15 (×3): 10 mg via ORAL
  Filled 2021-08-13 (×8): qty 1

## 2021-08-13 MED ORDER — CARBAMAZEPINE 200 MG PO TABS
200.0000 mg | ORAL_TABLET | Freq: Two times a day (BID) | ORAL | Status: DC
  Administered 2021-08-13 – 2021-08-15 (×4): 200 mg via ORAL
  Filled 2021-08-13 (×8): qty 1

## 2021-08-13 MED ORDER — INSULIN GLARGINE-YFGN 100 UNIT/ML ~~LOC~~ SOLN
35.0000 [IU] | Freq: Two times a day (BID) | SUBCUTANEOUS | Status: DC
  Administered 2021-08-13 – 2021-08-15 (×4): 35 [IU] via SUBCUTANEOUS

## 2021-08-13 MED ORDER — TRAZODONE HCL 50 MG PO TABS
50.0000 mg | ORAL_TABLET | Freq: Every evening | ORAL | Status: DC | PRN
  Administered 2021-08-13 – 2021-08-14 (×2): 50 mg via ORAL
  Filled 2021-08-13 (×2): qty 1

## 2021-08-13 MED ORDER — CHLORPROMAZINE HCL 25 MG PO TABS
25.0000 mg | ORAL_TABLET | Freq: Every day | ORAL | Status: DC
  Administered 2021-08-14: 25 mg via ORAL
  Filled 2021-08-13 (×3): qty 1

## 2021-08-13 MED ORDER — LOPERAMIDE HCL 2 MG PO CAPS: 2.0000 mg | ORAL_CAPSULE | ORAL | Status: DC | PRN

## 2021-08-13 MED ORDER — ONDANSETRON 4 MG PO TBDP
4.0000 mg | ORAL_TABLET | Freq: Four times a day (QID) | ORAL | Status: DC | PRN
  Administered 2021-08-14: 4 mg via ORAL
  Filled 2021-08-13: qty 1

## 2021-08-13 MED ORDER — NAPROXEN 500 MG PO TABS
500.0000 mg | ORAL_TABLET | Freq: Two times a day (BID) | ORAL | Status: DC | PRN
  Administered 2021-08-14: 500 mg via ORAL
  Filled 2021-08-13: qty 1

## 2021-08-13 MED ORDER — OLANZAPINE 5 MG PO TABS: 5.0000 mg | ORAL_TABLET | Freq: Two times a day (BID) | ORAL | Status: DC

## 2021-08-13 MED ORDER — DICYCLOMINE HCL 20 MG PO TABS
20.0000 mg | ORAL_TABLET | Freq: Four times a day (QID) | ORAL | Status: DC | PRN
Start: 2021-08-13 — End: 2021-08-15
  Administered 2021-08-14: 20 mg via ORAL
  Filled 2021-08-13: qty 1

## 2021-08-13 NOTE — Progress Notes (Signed)
°   08/12/21 2215  Psych Admission Type (Psych Patients Only)  Admission Status Voluntary  Psychosocial Assessment  Patient Complaints Irritability;Depression;Decreased concentration;Anger  Eye Contact Brief  Facial Expression Angry  Affect Angry;Irritable  Speech Argumentative  Interaction Attention-seeking;Forwards little  Motor Activity Other (Comment) (WDL)  Appearance/Hygiene In scrubs  Behavior Characteristics Irritable  Mood Labile;Irritable  Thought Process  Coherency Circumstantial  Content Blaming self  Delusions None reported or observed  Perception WDL  Hallucination None reported or observed  Judgment Impaired  Confusion None  Danger to Self  Current suicidal ideation? Denies  Danger to Others  Danger to Others None reported or observed

## 2021-08-13 NOTE — [facility] Suicide Risk Assessment (Signed)
[facility] IN[facility]IENT:  Family/Significant Other Suicide Prevention Education  Suicide Prevention Education:  Education Completed; Teegan Desocio [phone] (Wife) has been identified by the patient as the family member/significant other with whom the patient will be residing, and identified as the person(s) who will aid the patient in the event of a mental health crisis (suicidal ideations/suicide attempt).  With written consent from the patient, the family member/significant other has been provided the following suicide prevention education, prior to the and/or following the discharge of the patient.  The suicide prevention education provided includes the following: Suicide risk factors Suicide prevention and interventions National Suicide Hotline telephone number [facility] assessment telephone number [facility] Emergency Assistance [place] and/or Residential Mobile Crisis Unit telephone number  Request made of family/significant other to: Remove weapons (e.g., guns, rifles, knives), all items previously/currently identified as safety concern.   Remove drugs/medications (over-the-counter, prescriptions, illicit drugs), all items previously/currently identified as a safety concern.  The family member/significant other verbalizes understanding of the suicide prevention education information provided.  The family member/significant other agrees to remove the items of safety concern listed above.  CSW spoke with Mrs. Ruppel who states that her husband has been struggling with "overwhelming emotions and issues with his anger".  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  She states that he was recently placed on Abilify and shortly after made 2 suicide attempts (one by attempting to jump off a bridge and another by attempting to swallow a handful of pills).   Mrs. Stormer states that she is not sure if her husband will be coming back to the home after discharge and states that they have spoken about living separately for a while.  She states that "if he has no where else to go then I will let him move back in because I do not want him to be homeless".  She states that there are no firearms or weapons in the home.  CSW completed SPE with Mrs. Sawaya.    Darleen Crocker 08/13/2021, 11:37 AM

## 2021-08-13 NOTE — Progress Notes (Signed)
Pt was calm and cooperative this shift.  Pt would like to be able to go down to the cafeteria for meals with the rest of the other patients.  Pt's behavior from yesterday evening prevented pt from being allowed to eat in the cafeteria.  Pt denies SI/HI/AVH.  RN will continue to monitor and intervene as indicated.
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
## 2021-08-13 NOTE — Progress Notes (Signed)
Date and time results received: 08/13/21 0643   Test: CBG Critical Value: 328  Name of Provider Notified: Margorie John PA-C  Pt refused insulin. Pt stated "you can keep your insulin."

## 2021-08-13 NOTE — BHH Group Notes (Signed)
Adult Psychoeducational Group Note  Date:  08/13/2021 Time:  9:06 PM  Group Topic/Focus:  Wrap-Up Group:   The focus of this group is to help patients review their daily goal of treatment and discuss progress on daily workbooks.  Participation Level:  Active  Participation Quality:  Appropriate  Affect:  Appropriate  Cognitive:  Appropriate  Insight: Good  Engagement in Group:  Engaged  Modes of Intervention:  Discussion  Additional Comments:  Patient attended and participated in the Wrap-up group.  Jearl Klinefelter 08/13/2021, 9:06 PM
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
## 2021-08-13 NOTE — Progress Notes (Signed)
Inpatient Diabetes Program Recommendations  AACE/ADA: New Consensus Statement on Inpatient Glycemic Control   Target Ranges:  Prepandial:   less than 140 mg/dL      Peak postprandial:   less than 180 mg/dL (1-2 hours)      Critically ill patients:  140 - 180 mg/dL    Latest Reference Range & Units 08/13/21 05:35 08/13/21 07:58  Glucose-Capillary 70 - 99 mg/dL 315 (H) 176 (H)  (H): Data is abnormally high  Latest Reference Range & Units 08/12/21 17:01 08/12/21 17:04 08/12/21 17:07 08/12/21 17:29 08/12/21 18:08 08/12/21 22:06  Glucose-Capillary 70 - 99 mg/dL 160 (H) 737 (H) 106 (HH) 497 (H) 442 (H) 382 (H)    Latest Reference Range & Units 08/12/21 09:03 08/12/21 12:18 08/12/21 16:48  Glucose-Capillary 70 - 99 mg/dL 269 (H) 485 (H) 462 (H)   Review of Glycemic Control  Diabetes history: DM1 Outpatient Diabetes medications: Lantus 35 units BID, Novolog 1-15 units TID (1 unit for every 15 grams of carb) Current orders for Inpatient glycemic control: Semglee 30 units BID, Novolog 0-20 units TID with meals, Novolog 0-5 units QHS, Novolog 15 units TID with meals  Inpatient Diabetes Program Recommendations:    Insulin: Please consider increasing Semglee to 35 units BID.  Please decrease Novolog correction to 0-15 units TID with meals and meal coverage to 8 units TID with meals.  NOTE: Noted consult for diabetes coordinator. Inpatient diabetes coordinator spoke with patient on 08/06/21 while in [facility] ED.   Thanks, Orlando Penner, RN, MSN, CDE Diabetes Coordinator Inpatient Diabetes Program [phone] (Team Pager from 8am to 5pm)

## 2021-08-13 NOTE — Progress Notes (Signed)
Date and time results received: 08/12/21 2215  Test: CBG Critical Value: 382  Name of Provider Notified: Melbourne Abts PA-C  Provider made aware pt's CBG at bedtime was 382. Pt denies any hyperglycemic symptoms. Pt alert and oriented x4 . Speech clear and coherent. Pt states he feels fine. Provider made aware and ordered meds.   Pt refused insulin provider ordered because he stated " I already had insulin multiple times today." Pt irritable and stated he want to just be left alone to sleep. Pt remains not adhering to strict diet. Provider notified. UR placed on client to regulate meal selections choices from cafeteria.

## 2021-08-14 ENCOUNTER — Encounter (HOSPITAL_COMMUNITY): Payer: Self-pay

## 2021-08-14 LAB — GLUCOSE, CAPILLARY
Glucose-Capillary: 220 mg/dL — ABNORMAL HIGH (ref 70–99)
Glucose-Capillary: 235 mg/dL — ABNORMAL HIGH (ref 70–99)
Glucose-Capillary: 387 mg/dL — ABNORMAL HIGH (ref 70–99)
Glucose-Capillary: 361 mg/dL — ABNORMAL HIGH (ref 70–99)
Glucose-Capillary: 406 mg/dL — ABNORMAL HIGH (ref 70–99)
Glucose-Capillary: 268 mg/dL — ABNORMAL HIGH (ref 70–99)

## 2021-08-14 MED ORDER — NICOTINE 14 MG/24HR TD PT24
14.0000 mg | MEDICATED_PATCH | Freq: Every day | TRANSDERMAL | Status: DC
  Administered 2021-08-14: 14 mg via TRANSDERMAL
  Filled 2021-08-14 (×5): qty 1

## 2021-08-14 MED ORDER — INSULIN ASPART 100 UNIT/ML IJ SOLN
12.0000 [IU] | Freq: Three times a day (TID) | INTRAMUSCULAR | Status: DC
  Administered 2021-08-15: 12 [IU] via SUBCUTANEOUS

## 2021-08-14 MED ORDER — ADULT MULTIVITAMIN W/MINERALS CH
1.0000 | ORAL_TABLET | Freq: Every day | ORAL | Status: DC
  Administered 2021-08-14 – 2021-08-15 (×2): 1 via ORAL
  Filled 2021-08-14 (×5): qty 1

## 2021-08-14 MED ORDER — GLUCERNA SHAKE PO LIQD
237.0000 mL | ORAL | Status: DC
  Administered 2021-08-14: 237 mL via ORAL
  Filled 2021-08-14 (×4): qty 237

## 2021-08-14 NOTE — Group Note (Signed)
Date:  08/14/2021 Time:  10:53 AM  Group Topic/Focus:  Orientation:   The focus of this group is to educate the patient on the purpose and policies of crisis stabilization and provide a format to answer questions about their admission.  The group details unit policies and expectations of patients while admitted.    Participation Level:  Active  Participation Quality:  Appropriate  Affect:  Appropriate  Cognitive:  Appropriate  Insight: Appropriate  Engagement in Group:  Engaged  Modes of Intervention:  Discussion  Additional Comments:   Garvin Fila 08/14/2021, 10:53 AM

## 2021-08-14 NOTE — Progress Notes (Signed)
[facility] MD Progress Note  08/14/2021 2:56 PM [name] [name] [name] [name].  MRN:  [id number] Subjective:  [name] [name] is a 21 yr old male who presented on 2/6 to [facility] for worsening depression, SI with plan, and AVH, he was sent to [facility] for evaluation of reported OD on medications, he requested to leave AMA.  He then came back to [facility] on 2/9 reporting his wife would not allow him to come back home without receiving help, it was recommended that he be admitted to [facility] for observation which he declined.  On 2/10 he was brought to [facility] by police with SI plan to jump in front of a car because he wife left him.  He was brought to [facility] on 2/13.  PPHx is significant for Schizoaffective Disorder, Bipolar type, Bipolar 1 Disorder, MDD, PTSD, h/o Borderline PD, h/o Antisocial Disorder, and methamphetamine abuse, as well as past medical history significant for uncontrolled type 1 diabetes mellitus, DKA, hepatitis C, sickle cell trait, epilepsy, goiter, AKI, transaminitis, IV drug use, and diabetic peripheral neuropathy, SI (plan to jump from a bridge, walk in front of traffic), multiple hospitalizations (3 at [facility]).   Case was discussed in the multidisciplinary team. MAR was reviewed and patient was compliant with medications.  He received PRN Atarax and Trazodone yesterday.    Psychiatric Team made the following recommendations yesterday: -Start Thorazine 25 mg QHS  -Start Tegretol 200 mg BID     On interview today patient reports he is doing better today.  He reports that he slept well last night.  He reports that his appetite is doing good.  He reports no SI, HI, or AVH.  He reports no [place], Ideas of Reference, or other [place] symptoms.  He reports no issues with medications.  When asked what medications were helping him he states that his thinking is clear.  He states he is here for him to speak.  He states that his anger has also been improved because prior to the medication he would have outbursts of anger to 15  minutes.  He states that he has been talking with his wife and they both agree to give themselves some time apart so that he can start over and both get help.  He states he is been thinking about where he might go.  He states his best friend that he could stay with him however he knows that his friend uses and so this would not be a good environment for him to be in because he states "I cannot trust myself."  He states that his goals after discharge are to take his medications and get a job to help pay [name].  He states yesterday in group he was given a list of 150 coping skills and he states he spent last night reading through the entire list to pick some that he can work on.  He has said social work will be able to let him know if there are any currently outstanding for him because he wants to get set up and then turn himself in so that he can deal with those issues.  He reports no other concerns at present.   Principal Problem: Schizoaffective disorder, bipolar type ([place]) Diagnosis: Principal Problem:   Schizoaffective disorder, bipolar type ([place]) Active Problems:   Partial epilepsy with impairment of consciousness ([place])   Generalized convulsive epilepsy ([place])   Maladaptive health behaviors affecting medical condition   Attention deficit hyperactivity disorder (ADHD), combined type, moderate   Oppositional defiant  disorder   Somatic symptom disorder, persistent, moderate   Disordered eating   Borderline personality disorder ([place])   Crohn's disease of small intestine without complication ([place])  Total Time spent with patient:  I personally spent 30 minutes on the unit in direct patient care. The direct patient care time included face-to-face time with the patient, reviewing the patient's chart, communicating with other professionals, and coordinating care. Greater than 50% of this time was spent in counseling or coordinating care with the patient regarding goals of hospitalization,  psycho-education, and discharge planning needs.   Past Psychiatric History: Schizoaffective Disorder, Bipolar type, Bipolar 1 Disorder, MDD, PTSD, h/o Borderline PD, h/o Antisocial Disorder, and methamphetamine abuse, as well as past medical history significant for uncontrolled type 1 diabetes mellitus, DKA, hepatitis C, sickle cell trait, epilepsy, goiter, AKI, transaminitis, IV drug use, and diabetic peripheral neuropathy, SI (plan to jump from a bridge, walk in front of traffic), multiple hospitalizations (3 at [facility]).  Past Medical History:  Past Medical History:  Diagnosis Date   ADD (attention deficit disorder)    Allergy    Anxiety    Asthma    Depression    Epileptic seizure ([place])    "both petit and grand mal; last sz ~ 2 wk ago" (06/17/2013)   GUYQIHKV(425.9)    "2-3 times/wk usually" (06/17/2013)   Heart murmur    "heard a slight one earlier today" (06/17/2013)   Homeless    Migraine    "maybe once/month; it's severe" (06/17/2013)   ODD (oppositional defiant disorder)    Sickle cell trait ([place])    Type I diabetes mellitus ([place])    "that's what they're thinking now" (06/17/2013)   Vision abnormalities    "takes him longer to focus cause; from his sz" (06/17/2013)    Past Surgical History:  Procedure Laterality Date   CIRCUMCISION  2000   FINGER SURGERY Left 2001   "crushed pinky; had to repair it" (06/17/2013)   Family History:  Family History  Problem Relation Age of Onset   Asthma Mother    COPD Mother    Other Mother        possible autoimmune, unclear   Migraines Sister        Hemiplegic Migraines    Diabetes Maternal Grandmother    Heart disease Maternal Grandmother    Hypertension Maternal Grandmother    Mental illness Maternal Grandmother    Heart disease Maternal Grandfather    Hyperlipidemia Paternal Grandmother    Hyperlipidemia Paternal Grandfather    Family Psychiatric  History: Uncle and Sister- Completed Suicide Geophysicist/field seismologist and Mother-  Attempted Suicide Social History:  Social History   Substance and Sexual Activity  Alcohol Use No   Alcohol/week: 0.0 standard drinks     Social History   Substance and Sexual Activity  Drug Use Yes   Types: Methamphetamines   Comment: 10- 14 times a week    Social History   Socioeconomic History   Marital status: Married    Spouse name: Not on file   Number of children: 1   Years of education: Not on file   Highest education level: Not on file  Occupational History   Occupation: Unemployed  Tobacco Use   Smoking status: Every Day    Packs/day: 0.50    Types: Cigarettes    Last attempt to quit: 06/2020    Years since quitting: 1.1   Smokeless tobacco: Never   Tobacco comments:    Mom and dad smoke outside  Vaping Use   Vaping Use: Former  Substance and Sexual Activity   Alcohol use: No    Alcohol/week: 0.0 standard drinks   Drug use: Yes    Types: Methamphetamines    Comment: 10- 14 times a week   Sexual activity: Yes  Other Topics Concern   Not on file  Social History Narrative    followed by [facility]   Social Determinants of Health   Financial Resource Strain: Not on file  Food Insecurity: Not on file  Transportation Needs: Not on file  Physical Activity: Not on file  Stress: Not on file  Social Connections: Not on file   Additional Social History:                         Sleep: Good  Appetite:  Good  Current Medications: Current Facility-Administered Medications  Medication Dose Route Frequency Provider Last Rate Last Admin   acetaminophen (TYLENOL) tablet 650 mg  650 mg Oral Q6H PRN Starkes-Perry, Gayland Curry, FNP       alum & mag hydroxide-simeth (MAALOX/MYLANTA) 200-200-20 MG/5ML suspension 30 mL  30 mL Oral Q4H PRN Starkes-Perry, Gayland Curry, FNP       carbamazepine (TEGRETOL) tablet 200 mg  200 mg Oral Q12H Massengill, Ovid Curd, MD   200 mg at 08/14/21 0747   chlorproMAZINE (THORAZINE) tablet 10 mg  10 mg Oral BID Corky Sox, MD   10  mg at 08/14/21 1404   chlorproMAZINE (THORAZINE) tablet 25 mg  25 mg Oral QHS Corky Sox, MD       cloNIDine (CATAPRES) tablet 0.1 mg  0.1 mg Oral Q6H PRN Corky Sox, MD       dicyclomine (BENTYL) tablet 20 mg  20 mg Oral Q6H PRN Corky Sox, MD       hydrOXYzine (ATARAX) tablet 25 mg  25 mg Oral Q6H PRN Corky Sox, MD   25 mg at 08/14/21 0749   insulin aspart (novoLOG) injection 0-15 Units  0-15 Units Subcutaneous TID WC Corky Sox, MD   15 Units at 08/14/21 1146   insulin aspart (novoLOG) injection 0-5 Units  0-5 Units Subcutaneous QHS Prescilla Sours, PA-C   2 Units at 08/13/21 2107   insulin aspart (novoLOG) injection 8 Units  8 Units Subcutaneous TID WC Corky Sox, MD   8 Units at 08/14/21 1147   insulin glargine-yfgn (SEMGLEE) injection 35 Units  35 Units Subcutaneous BID Corky Sox, MD   35 Units at 08/14/21 0742   loperamide (IMODIUM) capsule 2-4 mg  2-4 mg Oral PRN Corky Sox, MD       OLANZapine zydis (ZYPREXA) disintegrating tablet 10 mg  10 mg Oral Q8H PRN Ethelene Hal, NP   10 mg at 08/12/21 4709   And   LORazepam (ATIVAN) tablet 1 mg  1 mg Oral PRN Ethelene Hal, NP       And   ziprasidone (GEODON) injection 20 mg  20 mg Intramuscular PRN Ethelene Hal, NP       magnesium hydroxide (MILK OF MAGNESIA) suspension 30 mL  30 mL Oral Daily PRN Starkes-Perry, Gayland Curry, FNP       methocarbamol (ROBAXIN) tablet 500 mg  500 mg Oral Q8H PRN Corky Sox, MD       naproxen (NAPROSYN) tablet 500 mg  500 mg Oral BID PRN Corky Sox, MD       nicotine (NICODERM CQ - dosed in mg/24 hours) patch 14 mg  14 mg Transdermal Daily Margorie John W, PA-C   14 mg at 08/14/21 0747   ondansetron (ZOFRAN-ODT) disintegrating tablet 4 mg  4 mg Oral Q6H PRN Corky Sox, MD       traZODone (DESYREL) tablet 50 mg  50 mg Oral QHS PRN Janine Limbo, MD   50 mg at 08/13/21 2216    Lab Results:  Results for orders placed or performed  during the hospital encounter of 08/12/21 (from the past 48 hour(s))  Glucose, capillary     Status: Abnormal   Collection Time: 08/12/21  4:48 PM  Result Value Ref Range   Glucose-Capillary 451 (H) 70 - 99 mg/dL    Comment: Glucose reference range applies only to samples taken after fasting for at least 8 hours.  Glucose, capillary     Status: Abnormal   Collection Time: 08/12/21  5:01 PM  Result Value Ref Range   Glucose-Capillary 475 (H) 70 - 99 mg/dL    Comment: Glucose reference range applies only to samples taken after fasting for at least 8 hours.  Glucose, capillary     Status: Abnormal   Collection Time: 08/12/21  5:04 PM  Result Value Ref Range   Glucose-Capillary 463 (H) 70 - 99 mg/dL    Comment: Glucose reference range applies only to samples taken after fasting for at least 8 hours.  Glucose, capillary     Status: Abnormal   Collection Time: 08/12/21  5:07 PM  Result Value Ref Range   Glucose-Capillary 505 (HH) 70 - 99 mg/dL    Comment: Glucose reference range applies only to samples taken after fasting for at least 8 hours.   Comment 1 Notify RN   Glucose, capillary     Status: Abnormal   Collection Time: 08/12/21  5:29 PM  Result Value Ref Range   Glucose-Capillary 497 (H) 70 - 99 mg/dL    Comment: Glucose reference range applies only to samples taken after fasting for at least 8 hours.  Glucose, capillary     Status: Abnormal   Collection Time: 08/12/21  6:08 PM  Result Value Ref Range   Glucose-Capillary 442 (H) 70 - 99 mg/dL    Comment: Glucose reference range applies only to samples taken after fasting for at least 8 hours.  Glucose, capillary     Status: Abnormal   Collection Time: 08/12/21 10:06 PM  Result Value Ref Range   Glucose-Capillary 382 (H) 70 - 99 mg/dL    Comment: Glucose reference range applies only to samples taken after fasting for at least 8 hours.  Glucose, capillary     Status: Abnormal   Collection Time: 08/13/21  5:35 AM  Result Value Ref  Range   Glucose-Capillary 328 (H) 70 - 99 mg/dL    Comment: Glucose reference range applies only to samples taken after fasting for at least 8 hours.   Comment 1 Notify RN   Glucose, capillary     Status: Abnormal   Collection Time: 08/13/21  7:58 AM  Result Value Ref Range   Glucose-Capillary 313 (H) 70 - 99 mg/dL    Comment: Glucose reference range applies only to samples taken after fasting for at least 8 hours.  Glucose, capillary     Status: Abnormal   Collection Time: 08/13/21 12:04 PM  Result Value Ref Range   Glucose-Capillary 127 (H) 70 - 99 mg/dL    Comment: Glucose reference range applies only to samples taken after fasting for at least 8 hours.  Glucose, capillary  Status: Abnormal   Collection Time: 08/13/21  1:31 PM  Result Value Ref Range   Glucose-Capillary 289 (H) 70 - 99 mg/dL    Comment: Glucose reference range applies only to samples taken after fasting for at least 8 hours.  Glucose, capillary     Status: Abnormal   Collection Time: 08/13/21  5:32 PM  Result Value Ref Range   Glucose-Capillary 356 (H) 70 - 99 mg/dL    Comment: Glucose reference range applies only to samples taken after fasting for at least 8 hours.  Lipid panel     Status: Abnormal   Collection Time: 08/13/21  7:01 PM  Result Value Ref Range   Cholesterol 156 0 - 200 mg/dL   Triglycerides 235 (H) <150 mg/dL   HDL 62 >40 mg/dL   Total CHOL/HDL Ratio 2.5 RATIO   VLDL 47 (H) 0 - 40 mg/dL   LDL Cholesterol 47 0 - 99 mg/dL    Comment:        Total Cholesterol/HDL:CHD Risk Coronary Heart Disease Risk Table                     Men   Women  1/2 Average Risk   3.4   3.3  Average Risk       5.0   4.4  2 X Average Risk   9.6   7.1  3 X Average Risk  23.4   11.0        Use the calculated Patient Ratio above and the CHD Risk Table to determine the patient's CHD Risk.        ATP III CLASSIFICATION (LDL):  <100     mg/dL   Optimal  100-129  mg/dL   Near or Above                    Optimal   130-159  mg/dL   Borderline  160-189  mg/dL   High  >190     mg/dL   Very High Performed at [facility] [address], [place], [place] [address]   TSH     Status: None   Collection Time: 08/13/21  7:01 PM  Result Value Ref Range   TSH 0.917 0.350 - 4.500 uIU/mL    Comment: Performed by a 3rd Generation assay with a functional sensitivity of <=0.01 uIU/mL. Performed at [facility], [place] [address], [place], [place] [address]   Hemoglobin A1c     Status: Abnormal   Collection Time: 08/13/21  7:01 PM  Result Value Ref Range   Hgb A1c MFr Bld 8.8 (H) 4.8 - 5.6 %    Comment: (NOTE) Pre diabetes:          5.7%-6.4%  Diabetes:              >6.4%  Glycemic control for   <7.0% adults with diabetes    Mean Plasma Glucose 205.86 mg/dL    Comment: Performed at [facility] [address] [place], [place] [address]  Glucose, capillary     Status: Abnormal   Collection Time: 08/13/21  9:01 PM  Result Value Ref Range   Glucose-Capillary 208 (H) 70 - 99 mg/dL    Comment: Glucose reference range applies only to samples taken after fasting for at least 8 hours.  Glucose, capillary     Status: Abnormal   Collection Time: 08/14/21  6:18 AM  Result Value Ref Range   Glucose-Capillary 220 (  H) 70 - 99 mg/dL    Comment: Glucose reference range applies only to samples taken after fasting for at least 8 hours.  Glucose, capillary     Status: Abnormal   Collection Time: 08/14/21  7:38 AM  Result Value Ref Range   Glucose-Capillary 235 (H) 70 - 99 mg/dL    Comment: Glucose reference range applies only to samples taken after fasting for at least 8 hours.   Comment 1 Notify RN   Glucose, capillary     Status: Abnormal   Collection Time: 08/14/21 11:38 AM  Result Value Ref Range   Glucose-Capillary 387 (H) 70 - 99 mg/dL    Comment: Glucose reference range applies only to samples taken after fasting for at least 8 hours.   Comment 1 Notify RN    Glucose, capillary     Status: Abnormal   Collection Time: 08/14/21  1:59 PM  Result Value Ref Range   Glucose-Capillary 361 (H) 70 - 99 mg/dL    Comment: Glucose reference range applies only to samples taken after fasting for at least 8 hours.   Comment 1 Notify RN     Blood Alcohol level:  Lab Results  Component Value Date   ETH <10 08/09/2021   ETH <10 74/16/3845    Metabolic Disorder Labs: Lab Results  Component Value Date   HGBA1C 8.8 (H) 08/13/2021   MPG 205.86 08/13/2021   MPG 254.65 11/09/2020   No results found for: PROLACTIN Lab Results  Component Value Date   CHOL 156 08/13/2021   TRIG 235 (H) 08/13/2021   HDL 62 08/13/2021   CHOLHDL 2.5 08/13/2021   VLDL 47 (H) 08/13/2021   LDLCALC 47 08/13/2021   LDLCALC UNABLE TO CALCULATE IF TRIGLYCERIDE OVER 400 mg/dL 05/04/2020    Physical Findings: AIMS: Facial and Oral Movements Muscles of Facial Expression: None, normal Lips and Perioral Area: None, normal Jaw: None, normal Tongue: None, normal,Extremity Movements Upper (arms, wrists, hands, fingers): None, normal Lower (legs, knees, ankles, toes): None, normal, Trunk Movements Neck, shoulders, hips: None, normal, Overall Severity Severity of abnormal movements (highest score from questions above): None, normal Incapacitation due to abnormal movements: None, normal Patient's awareness of abnormal movements (rate only patient's report): No Awareness, Dental Status Current problems with teeth and/or dentures?: No Does patient usually wear dentures?: No  CIWA:  CIWA-Ar Total: 4 COWS:  COWS Total Score: 3  Musculoskeletal: Strength & Muscle Tone: within normal limits Gait & Station: normal Patient leans: N/A  Psychiatric Specialty Exam:  Presentation  General Appearance: Appropriate for Environment; Casual; Fairly Groomed (multiple tattoos)  Eye Contact:Fair  Speech:Clear and Coherent; Normal Rate  Speech Volume:Normal  Handedness:Right   Mood  and Affect  Mood:-- ("ok")  Affect:Appropriate; Congruent   Thought Process  Thought Processes:Coherent; Goal Directed  Descriptions of Associations:Intact  Orientation:Full (Time, Place and Person)  Thought Content:Logical  History of Schizophrenia/Schizoaffective disorder:No  Duration of Psychotic Symptoms:Greater than six months  Hallucinations:Hallucinations: None  Ideas of Reference:None  Suicidal Thoughts:Suicidal Thoughts: No  Homicidal Thoughts:Homicidal Thoughts: No   Sensorium  Memory:Immediate Fair; Recent Fair  Judgment:Fair  Insight:Fair   Executive Functions  Concentration:Good  Attention Span:Good  [place] of Knowledge:Good  Language:Good   Psychomotor Activity  Psychomotor Activity:Psychomotor Activity: Normal  Assets  Assets:Communication Skills; Desire for Improvement; Housing; Social Support; Resilience   Sleep  Sleep:Sleep: Good   Physical Exam: Physical Exam Vitals and nursing note reviewed.  Constitutional:      General: He is not  in acute distress.    Appearance: Normal appearance. He is normal weight. He is not ill-appearing or toxic-appearing.  HENT:     Head: Normocephalic and atraumatic.  Pulmonary:     Effort: Pulmonary effort is normal.  Musculoskeletal:        General: Normal range of motion.  Neurological:     General: No focal deficit present.     Mental Status: He is alert.   Review of Systems  Respiratory:  Negative for cough and shortness of breath.   Cardiovascular:  Negative for chest pain.  Gastrointestinal:  Negative for abdominal pain, constipation, diarrhea, nausea and vomiting.  Neurological:  Negative for dizziness, weakness and headaches.  Psychiatric/Behavioral:  Negative for depression, hallucinations and suicidal ideas. The patient is not nervous/anxious.   Blood pressure 127/83, pulse (!) 104, temperature 98.2 F (36.8 C), temperature source Oral, resp. rate 16, height 5' 10.5"  (1.791 m), weight 77.5 kg, SpO2 99 %. Body mass index is 24.16 kg/m.   Treatment Plan Summary: Daily contact with patient to assess and evaluate symptoms and progress in treatment and Medication management  Jaystin Mcgarvey is a 21 yr old male who presented on 2/6 to [facility] for worsening depression, SI with plan, and AVH, he was sent to [facility] for evaluation of reported OD on medications, he requested to leave AMA.  He then came back to [facility] on 2/9 reporting his wife would not allow him to come back home without receiving help, it was recommended that he be admitted to [facility] for observation which he declined.  On 2/10 he was brought to [facility] by police with SI plan to jump in front of a car because he wife left him.  He was brought to [facility] on 2/13.  PPHx is significant for Schizoaffective Disorder, Bipolar type, Bipolar 1 Disorder, MDD, PTSD, h/o Borderline PD, h/o Antisocial Disorder, and methamphetamine abuse, as well as past medical history significant for uncontrolled type 1 diabetes mellitus, DKA, hepatitis C, sickle cell trait, epilepsy, goiter, AKI, transaminitis, IV drug use, and diabetic peripheral neuropathy, SI (plan to jump from a bridge, walk in front of traffic), multiple hospitalizations (3 at [facility]).   Aloysious has responded well to the starting of his medications.  He is able to have a calm rational discussion and has improved insight and judgement.  He will begin to work on discharge location.  We will not make any medication changes at this time.  Social Work will see if there are any Advice worker.  An ACT referral has been made.  We will continue to monitor.   Schizoaffective Disorder, Bipolar type: -Continue Thorazine 10 mg BID and 25 mg QHS -Continue Tegretol 200 mg BID   T1DM: Home regimen of Novolog 0-15U with meals, glargine 35U BID. Over the past 24 hrs, Fasting CBG of 328 and high of 505.  -Continue glargine 35U BID -Continue Novolog 8U prandial -Continue moderate  SSI   Withdrawal: -Continue CIWA, last score (2/15) = 4 -Continue COWS, last score (2/15) = 3 -Continue Clonidine 0.1 mg q6 PRN COWS COWS>10 -Continue Imodium 2-4 mg PRN -Continue Robaxin 500 mg q8 PRN -Continue Naproxen 500 mg BID PRN -Continue Zofran-ODT 4 mg q6 PRN   Hepatitis C: Chronic issue, has not been treated, likely due to ongoing IVDU. -Follow up outpatient PCP    Epilepsy: -Continue Tegretol 200 mg BID   Labs prior admission (2/10): CMP: Na: 126,  Cl: 92,  CO2:21,  AST: 141,  ALT: 315,  Alk phos:  150,  Total Bili: 1.3,  CBC: WNL,  UDS: Neg,  UA: Color: Straw,  Glucose: >=500,  Ketones: 20,  EtOH/Acetaminophen/Salicylate: WNL Repeat Labs (2/11):  BMP: WNL except  Creat: 0.57,  Ca: 8.7,  CBC: WNL except Abs Imm Gran: 0.17 Labs (2/14): Resp Panel: Neg,  Lipid Panel: WNL except Trig: 235,  WLDL: 47,  TSH: 0.917,  A1c: 8.8 EKG (2/8): Sinus Bradycardia with Sinus Arrhythmia and Qtc: 402.   Briant Cedar, MD 08/14/2021, 2:56 PM

## 2021-08-14 NOTE — Discharge Instructions (Signed)

## 2021-08-14 NOTE — BH IP Treatment Plan (Signed)
Interdisciplinary Treatment and Diagnostic Plan Update ° °08/14/2021 °Time of Session: 9:35am  °[name] M Shorkey Jr. °MRN: [id number] ° °Principal Diagnosis: Schizoaffective disorder, bipolar type (HCC) ° °Secondary Diagnoses: Principal Problem: °  Schizoaffective disorder, bipolar type (HCC) °Active Problems: °  Partial epilepsy with impairment of consciousness (HCC) °  Generalized convulsive epilepsy (HCC) °  Maladaptive health behaviors affecting medical condition °  Attention deficit hyperactivity disorder (ADHD), combined type, moderate °  Oppositional defiant disorder °  Somatic symptom disorder, persistent, moderate °  Disordered eating °  Borderline personality disorder (HCC) °  Crohn's disease of small intestine without complication (HCC) ° ° °Current Medications:  °Current Facility-Administered Medications  °Medication Dose Route Frequency Provider Last Rate Last Admin  ° acetaminophen (TYLENOL) tablet 650 mg  650 mg Oral Q6H PRN Starkes-Perry, Takia S, FNP      ° alum & mag hydroxide-simeth (MAALOX/MYLANTA) 200-200-20 MG/5ML suspension 30 mL  30 mL Oral Q4H PRN Starkes-Perry, Takia S, FNP      ° carbamazepine (TEGRETOL) tablet 200 mg  200 mg Oral Q12H Massengill, Nathan, MD   200 mg at 08/14/21 0747  ° chlorproMAZINE (THORAZINE) tablet 10 mg  10 mg Oral BID Gabrielle, Nick, MD   10 mg at 08/14/21 1404  ° chlorproMAZINE (THORAZINE) tablet 25 mg  25 mg Oral QHS Gabrielle, Nick, MD      ° cloNIDine (CATAPRES) tablet 0.1 mg  0.1 mg Oral Q6H PRN Gabrielle, Nick, MD      ° dicyclomine (BENTYL) tablet 20 mg  20 mg Oral Q6H PRN Gabrielle, Nick, MD      ° hydrOXYzine (ATARAX) tablet 25 mg  25 mg Oral Q6H PRN Gabrielle, Nick, MD   25 mg at 08/14/21 0749  ° insulin aspart (novoLOG) injection 0-15 Units  0-15 Units Subcutaneous TID WC Gabrielle, Nick, MD   15 Units at 08/14/21 1146  ° insulin aspart (novoLOG) injection 0-5 Units  0-5 Units Subcutaneous QHS Taylor, Cody W, PA-C   2 Units at 08/13/21 2107  ° insulin  aspart (novoLOG) injection 8 Units  8 Units Subcutaneous TID WC Gabrielle, Nick, MD   8 Units at 08/14/21 1147  ° insulin glargine-yfgn (SEMGLEE) injection 35 Units  35 Units Subcutaneous BID Gabrielle, Nick, MD   35 Units at 08/14/21 0742  ° loperamide (IMODIUM) capsule 2-4 mg  2-4 mg Oral PRN Gabrielle, Nick, MD      ° OLANZapine zydis (ZYPREXA) disintegrating tablet 10 mg  10 mg Oral Q8H PRN Parks, Laurie Britton, NP   10 mg at 08/12/21 1915  ° And  ° LORazepam (ATIVAN) tablet 1 mg  1 mg Oral PRN Parks, Laurie Britton, NP      ° And  ° ziprasidone (GEODON) injection 20 mg  20 mg Intramuscular PRN Parks, Laurie Britton, NP      ° magnesium hydroxide (MILK OF MAGNESIA) suspension 30 mL  30 mL Oral Daily PRN Starkes-Perry, Takia S, FNP      ° methocarbamol (ROBAXIN) tablet 500 mg  500 mg Oral Q8H PRN Gabrielle, Nick, MD      ° naproxen (NAPROSYN) tablet 500 mg  500 mg Oral BID PRN Gabrielle, Nick, MD      ° nicotine (NICODERM CQ - dosed in mg/24 hours) patch 14 mg  14 mg Transdermal Daily Taylor, Cody W, PA-C   14 mg at 08/14/21 0747  ° ondansetron (ZOFRAN-ODT) disintegrating tablet 4 mg  4 mg Oral Q6H PRN Gabrielle, Nick, MD      °   traZODone (DESYREL) tablet 50 mg  50 mg Oral QHS PRN Massengill, Ovid Curd, MD   50 mg at 08/13/21 2216   PTA Medications: Medications Prior to Admission  Medication Sig Dispense Refill Last Dose   albuterol (VENTOLIN HFA) 108 (90 Base) MCG/ACT inhaler Inhale 2 puffs into the lungs every 6 (six) hours as needed for wheezing or shortness of breath.      blood glucose meter kit and supplies KIT Dispense based on patient and insurance preference. Use up to four times daily as directed. (FOR ICD-9 250.00, 250.01). 1 each 0    EPINEPHrine 0.3 mg/0.3 mL IJ SOAJ injection Inject 0.3 mg into the muscle once as needed for anaphylaxis (severe allergic reaction). (Patient not taking: Reported on 08/06/2021)      glucose blood (FREESTYLE TEST STRIPS) test strip Use as instructed 100 each 0     insulin aspart (NOVOLOG FLEXPEN) 100 UNIT/ML FlexPen Inject 1-15 Units into the skin 3 (three) times daily with meals. 1 unit/g of carbohydrate sliding scale. (Patient taking differently: Inject 1-15 Units into the skin See admin instructions. Inject 1-15 units into the skin three times a day with meals, per correction carb correction scale: 1 unit of insulin for every 15 grams of carbohydrates) 15 mL 6    insulin glargine (LANTUS) 100 UNIT/ML Solostar Pen Inject 30 Units into the skin 2 (two) times daily. (Patient taking differently: Inject 35 Units into the skin in the morning and at bedtime.) 15 mL 6    Insulin Pen Needle (ADVOCATE INSULIN PEN NEEDLES) 29G X 12.7MM MISC For type I diabetes 100 each 3    lisinopril (ZESTRIL) 5 MG tablet Take 5 mg by mouth daily. (Patient not taking: Reported on 08/08/2021)      ondansetron (ZOFRAN) 4 MG tablet Take 4 mg by mouth every 8 (eight) hours as needed for nausea or vomiting.      pantoprazole (PROTONIX) 40 MG tablet Take 40 mg by mouth 2 (two) times daily.      QUEtiapine (SEROQUEL) 50 MG tablet Take 50 mg by mouth at bedtime.      sertraline (ZOLOFT) 50 MG tablet Take 50 mg by mouth at bedtime.       Patient Stressors: Financial difficulties   Health problems   Marital or family conflict   Medication change or noncompliance   Occupational concerns   Substance abuse    Patient Strengths: Capable of independent living  Motivation for treatment/growth   Treatment Modalities: Medication Management, Group therapy, Case management,  1 to 1 session with clinician, Psychoeducation, Recreational therapy.   Physician Treatment Plan for Primary Diagnosis: Schizoaffective disorder, bipolar type ([place]) Long Term Goal(s): Improvement in symptoms so as ready for discharge   Short Term Goals: Ability to maintain clinical measurements within normal limits will improve Compliance with prescribed medications will improve Ability to identify triggers associated  with substance abuse/mental health issues will improve Ability to identify changes in lifestyle to reduce recurrence of condition will improve Ability to verbalize feelings will improve Ability to disclose and discuss suicidal ideas Ability to demonstrate self-control will improve  Medication Management: Evaluate patient's response, side effects, and tolerance of medication regimen.  Therapeutic Interventions: 1 to 1 sessions, Unit Group sessions and Medication administration.  Evaluation of Outcomes: Not Met  Physician Treatment Plan for Secondary Diagnosis: Principal Problem:   Schizoaffective disorder, bipolar type ([place]) Active Problems:   Partial epilepsy with impairment of consciousness ([place])   Generalized convulsive epilepsy ([place])   Maladaptive health  behaviors affecting medical condition °  Attention deficit hyperactivity disorder (ADHD), combined type, moderate °  Oppositional defiant disorder °  Somatic symptom disorder, persistent, moderate °  Disordered eating °  Borderline personality disorder (HCC) °  Crohn's disease of small intestine without complication (HCC) ° °Long Term Goal(s): Improvement in symptoms so as ready for discharge  ° °Short Term Goals: Ability to maintain clinical measurements within normal limits will improve °Compliance with prescribed medications will improve °Ability to identify triggers associated with substance abuse/mental health issues will improve °Ability to identify changes in lifestyle to reduce recurrence of condition will improve °Ability to verbalize feelings will improve °Ability to disclose and discuss suicidal ideas °Ability to demonstrate self-control will improve    ° °Medication Management: Evaluate patient's response, side effects, and tolerance of medication regimen. ° °Therapeutic Interventions: 1 to 1 sessions, Unit Group sessions and Medication administration. ° °Evaluation of Outcomes: Not Met ° ° °RN Treatment Plan for Primary Diagnosis:  Schizoaffective disorder, bipolar type (HCC) °Long Term Goal(s): Knowledge of disease and therapeutic regimen to maintain health will improve ° °Short Term Goals: Ability to remain free from injury will improve, Ability to participate in decision making will improve, Ability to verbalize feelings will improve, Ability to disclose and discuss suicidal ideas, and Ability to identify and develop effective coping behaviors will improve ° °Medication Management: RN will administer medications as ordered by provider, will assess and evaluate patient's response and provide education to patient for prescribed medication. RN will report any adverse and/or side effects to prescribing provider. ° °Therapeutic Interventions: 1 on 1 counseling sessions, Psychoeducation, Medication administration, Evaluate responses to treatment, Monitor vital signs and CBGs as ordered, Perform/monitor CIWA, COWS, AIMS and Fall Risk screenings as ordered, Perform wound care treatments as ordered. ° °Evaluation of Outcomes: Not Met ° ° °LCSW Treatment Plan for Primary Diagnosis: Schizoaffective disorder, bipolar type (HCC) °Long Term Goal(s): Safe transition to appropriate next level of care at discharge, Engage patient in therapeutic group addressing interpersonal concerns. ° °Short Term Goals: Engage patient in aftercare planning with referrals and resources, Increase social support, Increase emotional regulation, Facilitate acceptance of mental health diagnosis and concerns, Identify triggers associated with mental health/substance abuse issues, and Increase skills for wellness and recovery ° °Therapeutic Interventions: Assess for all discharge needs, 1 to 1 time with Social worker, Explore available resources and support systems, Assess for adequacy in community support network, Educate family and significant other(s) on suicide prevention, Complete Psychosocial Assessment, Interpersonal group therapy. ° °Evaluation of Outcomes: Not  Met ° ° °Progress in Treatment: °Attending groups: Yes. °Participating in groups: Yes. °Taking medication as prescribed: Yes. °Toleration medication: Yes. °Family/Significant other contact made: Yes, individual(s) contacted:  Wife  °Patient understands diagnosis: Yes. °Discussing patient identified problems/goals with staff: Yes. °Medical problems stabilized or resolved: Yes. °Denies suicidal/homicidal ideation: Yes. °Issues/concerns per patient self-inventory: No. ° ° °New problem(s) identified: No, Describe:  None  ° °New Short Term/Long Term Goal(s): medication stabilization, elimination of SI thoughts, development of comprehensive mental wellness plan.  ° °Patient Goals: "To work on my anger management"  ° °Discharge Plan or Barriers: Patient recently admitted. CSW will continue to follow and assess for appropriate referrals and possible discharge planning.  ° °Reason for Continuation of Hospitalization: Depression °Medication stabilization °Suicidal ideation ° °Estimated Length of Stay: 3 to 5 days  ° ° °Scribe for Treatment Team: ° M , LCSWA °08/14/2021 °3:05 PM °

## 2021-08-14 NOTE — Group Note (Signed)
LCSW Group Therapy Note  Group Date: 08/14/2021 Start Time: 1300 End Time: 1400   Type of Therapy and Topic:  Group Therapy: Anger Cues and Responses  Participation Level:  Active   Description of Group:   In this group, patients learned how to recognize the physical, cognitive, emotional, and behavioral responses they have to anger-provoking situations.  They identified a recent time they became angry and how they reacted.  They analyzed how their reaction was possibly beneficial and how it was possibly unhelpful.  The group discussed a variety of healthier coping skills that could help with such a situation in the future.  Focus was placed on how helpful it is to recognize the underlying emotions to our anger, because working on those can lead to a more permanent solution as well as our ability to focus on the important rather than the urgent.  Therapeutic Goals: Patients will remember their last incident of anger and how they felt emotionally and physically, what their thoughts were at the time, and how they behaved. Patients will identify how their behavior at that time worked for them, as well as how it worked against them. Patients will explore possible new behaviors to use in future anger situations. Patients will learn that anger itself is normal and cannot be eliminated, and that healthier reactions can assist with resolving conflict rather than worsening situations.  Summary of Patient Progress:  [name] [name] was active during the group. He shared a recent occurrence wherein feeling like someone kept taking his things led to anger. He demonstrated good insight into the subject matter, was respectful of peers, and participated throughout the entire session.  Therapeutic Modalities:   Cognitive Behavioral Therapy    Beatris Si, LCSW 08/14/2021  1:55 PM

## 2021-08-14 NOTE — Group Note (Signed)
Date:  08/14/2021 Time:  11:04 AM  Group Topic/Focus:  Dimensions of Wellness:   The focus of this group is to introduce the topic of wellness and discuss the role each dimension of wellness plays in total health.    Participation Level:  Active  Participation Quality:  Appropriate  Affect:  Appropriate  Cognitive:  Appropriate  Insight: Appropriate  Engagement in Group:  Engaged  Modes of Intervention:  Discussion  Additional Comments:  pt was engaged in group  Jaquita Rector 08/14/2021, 11:04 AM

## 2021-08-14 NOTE — Progress Notes (Signed)
Pt denies SI/HI/AVH and verbally agrees to approach staff if these become apparent or before harming themselves/others. Rates depression 0/10. Rates anxiety 6/10. Rates pain 0/10. Pt has lied multiple times about his food/diet. Pt had a brownie in his pocket and stated "how did you find out?" Pt told me he did not eat it yet but then when I asked for it, told me that he ate half of it. Pt tried to make up excuses for not following his diet and as to why he was not. Pt was stating, "the nicotine patch makes my sugar go up" or "my finger was dirty so sugar got into my blood when they checked my sugar." Pt snuck the brownie was he told not to take it. Pt continues to lie throughout the day but has not shown any signs of aggression. RN has pt showing the snacks he takes and informed techs of what is allowed. Pt is complaint so far. Scheduled medications administered to pt, per MD orders. RN provided support and encouragement to pt. Q15 min safety checks implemented and continued. Pt safe on the unit. RN will continue to monitor and intervene as needed.   08/14/21 0747  Psych Admission Type (Psych Patients Only)  Admission Status Voluntary  Psychosocial Assessment  Patient Complaints Substance abuse;Anxiety  Eye Contact Intense  Facial Expression Animated  Affect Appropriate to circumstance  Speech Logical/coherent  Interaction Assertive  Motor Activity Other (Comment) (WDL)  Appearance/Hygiene Unremarkable  Behavior Characteristics Appropriate to situation  Mood Anxious;Euphoric;Silly  Thought Process  Coherency Circumstantial  Content Confabulation;Blaming others  Delusions None reported or observed  Perception WDL  Hallucination None reported or observed  Judgment Poor  Confusion None  Danger to Self  Current suicidal ideation? Denies  Danger to Others  Danger to Others None reported or observed

## 2021-08-14 NOTE — Progress Notes (Signed)
D- Patient came out in the hallway again after coming out multiple times after bedtime asking for personal hygiene items. He stated his items went missing. Pt came out room stating he feels like he is withdrawing and his stomach hurts and is cramping really badly. Pt states he is craving drugs and he is agitated. He came out of room in his sleeveless tank top stating he needs a PRN to calm him down.    A- PRN Bentyl, hydroxyzine, and bentyl given to patient. Routine safety checks conducted every 15 minutes.  Patient informed to notify staff with anymore problems or concerns.   R- Patient compliant with medications and verbalized understanding.

## 2021-08-14 NOTE — Progress Notes (Signed)
D- Patient alert and oriented x4. Patient in stable mood.  Denies SI, HI, AVH. Pt interacting positively with staff and peers. Pt stated he is starting to crave smoking and "durgs." Pt requested to have nictoine patch instead of gum. Pt has been respectful and cooperative,    A- Scheduled medications administered to patient, per MD orders.Routine safety checks conducted every 15 minutes.  Patient informed to notify staff with problems or concerns.   R- Patient compliant with medications and verbalized understanding treatment plan. Patient receptive, calm, and cooperative.      08/13/21 2105  Psych Admission Type (Psych Patients Only)  Admission Status Voluntary  Psychosocial Assessment  Patient Complaints Substance abuse;Other (Comment) (Pt states he is having craving to "do drugs")  Eye Contact Avertive  Facial Expression Animated  Affect Appropriate to circumstance  Speech Logical/coherent  Interaction Assertive  Motor Activity Other (Comment) (WDL)  Appearance/Hygiene Unremarkable  Behavior Characteristics Appropriate to situation;Cooperative;Calm  Mood Pleasant  Thought Process  Coherency Circumstantial  Content Blaming others;Blaming self  Delusions None reported or observed  Perception WDL  Hallucination None reported or observed  Judgment Poor  Confusion None  Danger to Self  Current suicidal ideation? Denies  Danger to Others  Danger to Others None reported or observed

## 2021-08-14 NOTE — BHH Counselor (Addendum)
4:03pm - CSW spoke with Ammie Ferrier at [facility] and she states that she will be out to speak with the Pt at 10:00am tomorrow morning (08/15/2021).  CSW will prepare the Pt with this information.   11:47am - CSW faxed a referral to [facility], per the Pt's request. The Pt asked that his wife's information be added to the form as well.  CSW spoke with the Pt to inform him that the referral had been sent and that the agency would be in contact with him or his wife after discharge.   11:00am - CSW contacted [facility] and spoke with Ammie Ferrier [phone] who stated that the referral for [facility] was received.  Mrs. Kizzie Bane states that she will be speaking with her supervisor today and will be contacting the CSW later today to schedule a time to come see the Pt tomorrow at the hospital (08/14/2021).  Once CSW has a time scheduled with Mrs. Kizzie Bane CSW will inform the Pt of this information.    9:45am - CSW spoke with the Pt who states that his wife is going to help him find housing.  He states that she will not let him move back into the home at this time but states that she is calling places and looking for available beds.  He states that he is not accepting offers from friends that are substance users because he is attempting to stay sober.  He states that he is looking for shelters and will be calling places from the list that the CSW provided for him.

## 2021-08-14 NOTE — Group Note (Signed)
Recreation Therapy Group Note   Group Topic:Stress Management  Group Date: 08/14/2021 Start Time: 0930 End Time: 0954 Facilitators: Magda Bernheim, LRT,CTRS Location: [address] Dayroom   Goal Area(s) Addresses:  Patient will actively participate in stress management techniques presented during session.  Patient will successfully identify benefit of practicing stress management post d/c.   Group Description: Guided Imagery. LRT provided education, instruction, and demonstration on practice of visualization via guided imagery. Patient was asked to participate in the technique introduced during session. LRT debriefed including topics of mindfulness, stress management and specific scenarios each patient could use these techniques. Patients were given suggestions of ways to access scripts post d/c and encouraged to explore Youtube and other apps available on smartphones, tablets, and computers.   Affect/Mood: N/A   Participation Level: Did not attend    Clinical Observations/Individualized Feedback:     Plan: Continue to engage patient in RT group sessions 2-3x/week.   Victorino Sparrow, Glennis Brink 08/14/2021 12:42 PM

## 2021-08-14 NOTE — Progress Notes (Signed)
NUTRITION ASSESSMENT  Pt identified as at risk on the Malnutrition Screen Tool  INTERVENTION: 1. Supplements: Glucerna Shake po daily each supplement provides 220 kcal and 10 grams of protein  2. Multivitamin with minerals daily 3. Placed "Carbohydrate Counting" handout in discharge instructions  NUTRITION DIAGNOSIS: Unintentional weight loss related to sub-optimal intake as evidenced by pt report.   Goal: Pt to meet >/= 90% of their estimated nutrition needs.  Monitor:  PO intake  Assessment:  Pt admitted for depression, SI and AVH. Pt with history T1DM, schizoaffective disorder, bipolar disorder and substance abuse (meth). Reports good appetite. Per nursing documentation, pt has been eating brownies as snacks.  Per weight records, pt has lost 29 lbs since 03/13/21 (14% wt loss  x 5 months, significant for time frame). Weight loss is likely d/t uncontrolled diabetes. At risk of malnutrition and vitamin/mineral deficiencies. Will order daily MVI. Noted food allergies of fish, shellfish and tea. Glucerna shakes have been ordered, would continue given weight loss.  Height: Ht Readings from Last 1 Encounters:  08/12/21 5' 10.5" (1.791 m)    Weight: Wt Readings from Last 1 Encounters:  08/12/21 77.5 kg    Weight Hx: Wt Readings from Last 10 Encounters:  08/12/21 77.5 kg  08/05/21 90.7 kg  03/13/21 90.7 kg  02/10/21 91.6 kg  12/10/20 88.5 kg  11/12/20 91.6 kg  11/09/20 92 kg  05/17/20 104.3 kg  05/10/20 104.3 kg  02/17/20 90.7 kg    BMI:  Body mass index is 24.16 kg/m. Pt meets criteria for normal based on current BMI.  Estimated Nutritional Needs: Kcal: 25-30 kcal/kg Protein: > 1 gram protein/kg Fluid: 1 ml/kcal  Diet Order:  Diet Order             Diet Carb Modified Fluid consistency: Thin; Room service appropriate? Yes  Diet effective now                  Pt is also offered choice of unit snacks mid-morning and mid-afternoon.   Lab results and  medications reviewed.  TG: 235 -elevated A1c: 8.8  Tilda Franco, MS, RD, LDN Inpatient Clinical Dietitian Contact information available via Am[employer]

## 2021-08-14 NOTE — BHH Counselor (Signed)
CSW provided the Pt with a packet that contained information about housing and shelter resources, free and reduced price foods, clothing resources, Crisis center information, a [employer], and suicide prevention information.

## 2021-08-14 NOTE — Progress Notes (Signed)
Inpatient Diabetes Program Recommendations  AACE/ADA: New Consensus Statement on Inpatient Glycemic Control   Target Ranges:  Prepandial:   less than 140 mg/dL      Peak postprandial:   less than 180 mg/dL (1-2 hours)      Critically ill patients:  140 - 180 mg/dL    Review of Glycemic Control  Diabetes history: DM1 Outpatient Diabetes medications: Lantus 35 units BID, Novolog 1-15 units TID (1 unit for every 15 grams of carb) Current orders for Inpatient glycemic control: Semglee 30 units BID, Novolog 0-20 units TID with meals, Novolog 0-5 units QHS, Novolog 15 units TID with meals  Inpatient Diabetes Program Recommendations:    Insulin: Please consider increasing Novolog meal coverage to 12 units TID with meals.  NOTE: Noted consult for diabetes coordinator. Inpatient diabetes coordinator spoke with patient on 08/06/21 while in [facility] ED.   Thanks, Christena Deem RN, MSN, BC-ADM Inpatient Diabetes Coordinator Team Pager [phone] (8a-5p)

## 2021-08-14 NOTE — BHH Counselor (Addendum)
CSW contacted the [facility] and was informed that the Pt has no warrants for his arrest at this time.  CSW will inform the Pt of this information.    CSW spoke with the Pt who states that he will be going back to live with his wife after discharge.  He states that he has worked things out with his wife and that CSW can call and confirm this with her.  He states that they will be living in there current home for a couple weeks until they can find another place to move to.  He states that if they cannot find somewhere to move to then they will go to live with his sister.  The Pt also reviewed all of his outpatient referrals with CSW and asked the CSW to check for any currant so he could "turn myself in now to avoid any further issues". CSW will contact the Sheriff's Office to check on any warrants for the Pt and will confirm the Pt's living arrangements with his wife.

## 2021-08-15 DIAGNOSIS — F1110 Opioid abuse, uncomplicated: Secondary | ICD-10-CM

## 2021-08-15 DIAGNOSIS — G40209 Localization-related (focal) (partial) symptomatic epilepsy and epileptic syndromes with complex partial seizures, not intractable, without status epilepticus: Secondary | ICD-10-CM

## 2021-08-15 DIAGNOSIS — F913 Oppositional defiant disorder: Secondary | ICD-10-CM

## 2021-08-15 DIAGNOSIS — F17200 Nicotine dependence, unspecified, uncomplicated: Secondary | ICD-10-CM

## 2021-08-15 DIAGNOSIS — F902 Attention-deficit hyperactivity disorder, combined type: Secondary | ICD-10-CM

## 2021-08-15 DIAGNOSIS — F4310 Post-traumatic stress disorder, unspecified: Secondary | ICD-10-CM

## 2021-08-15 DIAGNOSIS — F603 Borderline personality disorder: Secondary | ICD-10-CM

## 2021-08-15 DIAGNOSIS — F451 Undifferentiated somatoform disorder: Secondary | ICD-10-CM

## 2021-08-15 DIAGNOSIS — F1599 Other stimulant use, unspecified with unspecified stimulant-induced disorder: Secondary | ICD-10-CM

## 2021-08-15 DIAGNOSIS — F411 Generalized anxiety disorder: Secondary | ICD-10-CM

## 2021-08-15 DIAGNOSIS — F250 Schizoaffective disorder, bipolar type: Principal | ICD-10-CM

## 2021-08-15 LAB — GLUCOSE, CAPILLARY: Glucose-Capillary: 296 mg/dL — ABNORMAL HIGH (ref 70–99)

## 2021-08-15 MED ORDER — CHLORPROMAZINE HCL 10 MG PO TABS: 10.0000 mg | ORAL_TABLET | Freq: Two times a day (BID) | ORAL | 0 refills | Status: DC

## 2021-08-15 MED ORDER — CARBAMAZEPINE 200 MG PO TABS
200.0000 mg | ORAL_TABLET | Freq: Two times a day (BID) | ORAL | 0 refills | Status: DC
Start: 2021-08-15 — End: 2021-12-04

## 2021-08-15 MED ORDER — LISINOPRIL 5 MG PO TABS: 5.0000 mg | ORAL_TABLET | Freq: Every day | ORAL | 0 refills | Status: DC

## 2021-08-15 MED ORDER — NICOTINE 14 MG/24HR TD PT24: 14.0000 mg | MEDICATED_PATCH | Freq: Every day | TRANSDERMAL | 0 refills | Status: DC

## 2021-08-15 MED ORDER — CHLORPROMAZINE HCL 25 MG PO TABS
25.0000 mg | ORAL_TABLET | Freq: Every day | ORAL | 0 refills | Status: DC
Start: 2021-08-15 — End: 2021-11-13

## 2021-08-15 NOTE — Discharge Summary (Signed)
Physician Discharge Summary Note  Patient:  [name] [name]. is an 21 y.o., male MRN:  [id number] DOB:  [birth year] Patient phone:  [phone] (home)  Patient address:   [place] [address],  Total Time spent with patient: 30 minutes  Date of Admission:  08/12/2021 Date of Discharge: 08/15/2021  Reason for Admission:   [name] [name] is a 21 yr old male who presented on 2/6 to [facility] for worsening depression, SI with plan, and AVH, he was sent to [facility] for evaluation of reported OD on medications, he requested to leave AMA.  He then came back to [facility] on 2/9 reporting his wife would not allow him to come back home without receiving help, it was recommended that he be admitted to [facility] for observation which he declined.  On 2/10 he was brought to [facility] by police with SI plan to jump in front of a car because he wife left him.  He was brought to [facility] on 2/13.  PPHx is significant for Schizoaffective Disorder, Bipolar type, Bipolar 1 Disorder, MDD, PTSD, h/o Borderline PD, h/o Antisocial Disorder, and methamphetamine abuse, as well as past medical history significant for uncontrolled type 1 diabetes mellitus, DKA, hepatitis C, sickle cell trait, epilepsy, goiter, AKI, transaminitis, IV drug use, and diabetic peripheral neuropathy, SI (plan to jump from a bridge, walk in front of traffic), multiple hospitalizations (3 at [facility]).  He reports coming to the behavioral health hospital voluntarily after "my wife pulled me off a bridge when I was about to jump".  The patient reports recent pan positive depressive symptoms.  He reports decreased appetite, sleep disturbance, feelings of guilt, hopelessness, and worthlessness, as well as decreased energy and suicidal thoughts.   Principal Problem: Schizoaffective disorder, bipolar type [facility]) Discharge Diagnoses: Principal Problem:   Schizoaffective disorder, bipolar type ([place]) Active Problems:   Partial epilepsy with impairment of consciousness ([place])    Generalized convulsive epilepsy ([place])   Maladaptive health behaviors affecting medical condition   Attention deficit hyperactivity disorder (ADHD), combined type, moderate   Oppositional defiant disorder   Somatic symptom disorder, persistent, moderate   Disordered eating   Borderline personality disorder ([place])   Crohn's disease of small intestine without complication ([place])   Past Psychiatric History: Schizoaffective Disorder, Bipolar type, Bipolar 1 Disorder, MDD, PTSD, h/o Borderline PD, h/o Antisocial Disorder, and methamphetamine abuse, as well as past medical history significant for uncontrolled type 1 diabetes mellitus, DKA, hepatitis C, sickle cell trait, epilepsy, goiter, AKI, transaminitis, IV drug use, and diabetic peripheral neuropathy, SI (plan to jump from a bridge, walk in front of traffic), multiple hospitalizations (3 at [facility]).  Past Medical History:  Past Medical History:  Diagnosis Date   ADD (attention deficit disorder)    Allergy    Anxiety    Asthma    Depression    Epileptic seizure ([place])    "both petit and grand mal; last sz ~ 2 wk ago" (06/17/2013)   KPTWSFKC(127.5)    "2-3 times/wk usually" (06/17/2013)   Heart murmur    "heard a slight one earlier today" (06/17/2013)   Homeless    Migraine    "maybe once/month; it's severe" (06/17/2013)   ODD (oppositional defiant disorder)    Sickle cell trait ([place])    Type I diabetes mellitus ([place])    "that's what they're thinking now" (06/17/2013)   Vision abnormalities    "takes him longer to focus cause; from his sz" (06/17/2013)    Past Surgical History:  Procedure Laterality  Date   CIRCUMCISION  2000   FINGER SURGERY Left 2001   "crushed pinky; had to repair it" (06/17/2013)   Family History:  Family History  Problem Relation Age of Onset   Asthma Mother    COPD Mother    Other Mother        possible autoimmune, unclear   Migraines Sister        Hemiplegic Migraines    Diabetes Maternal Grandmother     Heart disease Maternal Grandmother    Hypertension Maternal Grandmother    Mental illness Maternal Grandmother    Heart disease Maternal Grandfather    Hyperlipidemia Paternal Grandmother    Hyperlipidemia Paternal Grandfather    Family Psychiatric  History: Uncle and Sister- Completed Suicide Grandmother and Mother- Attempted Suicide Social History:  Social History   Substance and Sexual Activity  Alcohol Use No   Alcohol/week: 0.0 standard drinks     Social History   Substance and Sexual Activity  Drug Use Yes   Types: Methamphetamines   Comment: 10- 14 times a week    Social History   Socioeconomic History   Marital status: Married    Spouse name: Not on file   Number of children: 1   Years of education: Not on file   Highest education level: Not on file  Occupational History   Occupation: Unemployed  Tobacco Use   Smoking status: Every Day    Packs/day: 0.50    Types: Cigarettes    Last attempt to quit: 06/2020    Years since quitting: 1.1   Smokeless tobacco: Never   Tobacco comments:    Mom and dad smoke outside   Vaping Use   Vaping Use: Former  Substance and Sexual Activity   Alcohol use: No    Alcohol/week: 0.0 standard drinks   Drug use: Yes    Types: Methamphetamines    Comment: 10- 14 times a week   Sexual activity: Yes  Other Topics Concern   Not on file  Social History Narrative    followed by [facility]   Social Determinants of Health   Financial Resource Strain: Not on file  Food Insecurity: Not on file  Transportation Needs: Not on file  Physical Activity: Not on file  Stress: Not on file  Social Connections: Not on file    Hospital Course:   During hospitalization, patient was evaluated by the psychiatric team.  They received multiple disciplinary care.  After initial intake evaluation, it was decided to adjust medications.  He was started on Thorazine for his psychosis and with regards to his Diabetes.  For his untreated Seizure  disorder and mood stability he was started on Tegretol.  He initial had issues controlling his blood sugar but after discussion and diabetic Coordinator recommendations he sugars became better controlled.  He responded well to his medications. Patient not having side effects to psychiatric medications.   Patient received supportive psychotherapy and was encouraged to participate in group therapy during the hospitalization. Patient denied having suicidal thoughts more than 48 hours prior to discharge.    On day of discharge patient reports he is feeling good, better than he has in years.  He reports that he slept well last night.  He reports his appetite is doing good.  He reports no SI, HI, or AVH.  He reports no [place], Ideas of Reference, or other [place] symptoms.  He reports no issues with his medications.  He reports that he will be going back  to his house because he and his wife have talked and worked things out.  He reports that he has been using his coping skills last night (reports last night he got frustrated so went into his room to color).  He reports that if he gets in a situation he plans to use breathing techniques and remove himself from the situation and take time to cool off before returning.  Discussed with him that we would provide info for a Methadone clinic to help with his cravings.  He reports no concerns at present.  Discussed with him what to do in the event of a future crisis.  Discussed that he can return to [facility], go to the [facility] [place], go to the nearest ED, or call 911 or [phone].   He reported understanding and had no concerns.  He was discharged home to his wife.    Physical Findings: AIMS: Facial and Oral Movements Muscles of Facial Expression: None, normal Lips and Perioral Area: None, normal Jaw: None, normal Tongue: None, normal,Extremity Movements Upper (arms, wrists, hands, fingers): None, normal Lower (legs, knees, ankles, toes): None, normal, Trunk Movements Neck,  shoulders, hips: None, normal, Overall Severity Severity of abnormal movements (highest score from questions above): None, normal Incapacitation due to abnormal movements: None, normal Patient's awareness of abnormal movements (rate only patient's report): No Awareness, Dental Status Current problems with teeth and/or dentures?: No Does patient usually wear dentures?: No  No Cogwheeling or Rigidity CIWA:  CIWA-Ar Total: 7 COWS:  COWS Total Score: 9  Musculoskeletal: Strength & Muscle Tone: within normal limits Gait & Station: normal Patient leans: N/A   Psychiatric Specialty Exam:  Presentation  General Appearance: Appropriate for Environment; Casual; Fairly Groomed (multiple tattoos)  Eye Contact:Good  Speech:Clear and Coherent; Normal Rate  Speech Volume:Normal  Handedness:Right   Mood and Affect  Mood:-- (good, hopeful)  Affect:Appropriate; Congruent   Thought Process  Thought Processes:Coherent; Goal Directed  Descriptions of Associations:Intact  Orientation:Full (Time, Place and Person)  Thought Content:Logical  History of Schizophrenia/Schizoaffective disorder:No  Duration of Psychotic Symptoms:Greater than six months  Hallucinations:Hallucinations: None  Ideas of Reference:None  Suicidal Thoughts:Suicidal Thoughts: No  Homicidal Thoughts:Homicidal Thoughts: No   Sensorium  Memory:Immediate Fair; Recent Fair  Judgment:Fair  Insight:Fair   Executive Functions  Concentration:Good  Attention Span:Good  [place] of Knowledge:Good  Language:Good   Psychomotor Activity  Psychomotor Activity:Psychomotor Activity: Normal   Assets  Assets:Communication Skills; Desire for Improvement; Social Support; Resilience; Physical Health   Sleep  Sleep:Sleep: Good    Physical Exam: Physical Exam Vitals and nursing note reviewed.  Constitutional:      General: He is not in acute distress.    Appearance: Normal appearance. He is  normal weight. He is not ill-appearing or toxic-appearing.  HENT:     Head: Normocephalic and atraumatic.  Cardiovascular:     Rate and Rhythm: Normal rate.  Pulmonary:     Effort: Pulmonary effort is normal.  Musculoskeletal:        General: Normal range of motion.  Neurological:     General: No focal deficit present.     Mental Status: He is alert.   Review of Systems  Respiratory:  Negative for cough and shortness of breath.   Cardiovascular:  Negative for chest pain.  Gastrointestinal:  Negative for abdominal pain, constipation, diarrhea, nausea and vomiting.  Neurological:  Negative for dizziness, weakness and headaches.  Psychiatric/Behavioral:  Negative for depression, hallucinations and suicidal ideas. The patient is not nervous/anxious.  Blood pressure 131/88, pulse 92, temperature 98 F (36.7 C), temperature source Oral, resp. rate 16, height 5' 10.5" (1.791 m), weight 77.5 kg, SpO2 99 %. Body mass index is 24.16 kg/m.   Social History   Tobacco Use  Smoking Status Every Day   Packs/day: 0.50   Types: Cigarettes   Last attempt to quit: 06/2020   Years since quitting: 1.1  Smokeless Tobacco Never  Tobacco Comments   Mom and dad smoke outside    Tobacco Cessation:  A prescription for an FDA-approved tobacco cessation medication provided at discharge   Blood Alcohol level:  Lab Results  Component Value Date   [facility] <10 08/09/2021   ETH <10 34/74/2595    Metabolic Disorder Labs:  Lab Results  Component Value Date   HGBA1C 8.8 (H) 08/13/2021   MPG 205.86 08/13/2021   MPG 254.65 11/09/2020   No results found for: PROLACTIN Lab Results  Component Value Date   CHOL 156 08/13/2021   TRIG 235 (H) 08/13/2021   HDL 62 08/13/2021   CHOLHDL 2.5 08/13/2021   VLDL 47 (H) 08/13/2021   LDLCALC 47 08/13/2021   LDLCALC UNABLE TO CALCULATE IF TRIGLYCERIDE OVER 400 mg/dL 05/04/2020    See Psychiatric Specialty Exam and Suicide Risk Assessment completed by Attending  Physician prior to discharge.  Discharge destination:  Home  Is patient on multiple antipsychotic therapies at discharge:  No   Has Patient had three or more failed trials of antipsychotic monotherapy by history:  No  Recommended Plan for Multiple Antipsychotic Therapies: NA  Discharge Instructions     Diet - low sodium heart healthy   Complete by: As directed    Increase activity slowly   Complete by: As directed       Allergies as of 08/15/2021       Reactions   Bee Venom Anaphylaxis   Fish Allergy Anaphylaxis   Shellfish Allergy Anaphylaxis   Tea Anaphylaxis        Medication List     STOP taking these medications    ondansetron 4 MG tablet Commonly known as: ZOFRAN   pantoprazole 40 MG tablet Commonly known as: PROTONIX   QUEtiapine 50 MG tablet Commonly known as: SEROQUEL   sertraline 50 MG tablet Commonly known as: ZOLOFT       TAKE these medications      Indication  Advocate Insulin Pen Needles 29G X 12.7MM Misc Generic drug: Insulin Pen Needle For type I diabetes  Indication: Diabetes   albuterol 108 (90 Base) MCG/ACT inhaler Commonly known as: VENTOLIN HFA Inhale 2 puffs into the lungs every 6 (six) hours as needed for wheezing or shortness of breath.  Indication: Asthma   blood glucose meter kit and supplies Kit Dispense based on patient and insurance preference. Use up to four times daily as directed. (FOR ICD-9 250.00, 250.01).  Indication: Diabetes   carbamazepine 200 MG tablet Commonly known as: TEGRETOL Take 1 tablet (200 mg total) by mouth every 12 (twelve) hours.  Indication: Mood Stability   chlorproMAZINE 25 MG tablet Commonly known as: THORAZINE Take 1 tablet (25 mg total) by mouth at bedtime.  Indication: Mood Stability   chlorproMAZINE 10 MG tablet Commonly known as: THORAZINE Take 1 tablet (10 mg total) by mouth 2 (two) times daily.  Indication: Mood Stability   EPINEPHrine 0.3 mg/0.3 mL Soaj injection Commonly  known as: EPI-PEN Inject 0.3 mg into the muscle once as needed for anaphylaxis (severe allergic reaction).  Indication: Life-Threatening Hypersensitivity Reaction  FREESTYLE TEST STRIPS test strip Generic drug: glucose blood Use as instructed  Indication: Diabetes   insulin glargine 100 UNIT/ML Solostar Pen Commonly known as: LANTUS Inject 30 Units into the skin 2 (two) times daily. What changed:  how much to take when to take this  Indication: Insulin-Dependent Diabetes   lisinopril 5 MG tablet Commonly known as: ZESTRIL Take 1 tablet (5 mg total) by mouth daily.  Indication: Cardiac Failure   nicotine 14 mg/24hr patch Commonly known as: NICODERM CQ - dosed in mg/24 hours Place 1 patch (14 mg total) onto the skin daily.  Indication: Nicotine Addiction   NovoLOG FlexPen 100 UNIT/ML FlexPen Generic drug: insulin aspart Inject 1-15 Units into the skin 3 (three) times daily with meals. 1 unit/g of carbohydrate sliding scale. What changed:  when to take this additional instructions  Indication: Insulin-Dependent Diabetes        Follow-up [place]. Go to.   Specialty: Behavioral Health Why: Please go to this provider for therapy and medication management services during walk in hours:  Monday through Wednesday from 7:45 am to 11:00 am.  Services are provided on a first come, first served basis. Contact information: [place], [facility] Follow up.   Why: A referral was sent to this agency for ACT Services.  You may also inquire about Anger Management services at this agency. Contact information: [place] [address] [place] Follow up.   Specialty: Professional Counselor Why: You may use walk-in hours Monday through Friday 8:30am to 12:00pm and 1:00pm to 2:30pm to sign-up for  anger management groups and therapy services. Contact information: [facility] [place] [address] [phone]         [facility], [facility] [employer] Follow up.   Why: A referral has been sent to this provider for the [facility]. Contact information: Address: [address] [place], [place] [address]  Phone: [phone] Fax: [phone]                Follow-up recommendations:   - Activity as tolerated. - Diet as recommended by PCP. - Keep all scheduled follow-up appointments as recommended. - Follow up with PCP about Diabetes management and Hep C  Comments:   Patient is instructed to take all prescribed medications as recommended. Report any side effects or adverse reactions to your outpatient psychiatrist. Patient is instructed to abstain from alcohol and illegal drugs while on prescription medications. In the event of worsening symptoms, patient is instructed to call the crisis hotline, 911, or go to the nearest emergency department for evaluation and treatment.  Signed: Briant Cedar, MD 08/15/2021, 9:44 AM

## 2021-08-15 NOTE — BHH Counselor (Addendum)
11:40am - CSW spoke with the Pt after his meeting and the Pt states that the [facility] ACT Team will be in contact with him in 2 to 3 days.  The ACT Team will follow-up with the Pt at his home and will contact the Pt at his own phone number since the Pt has been discharged from the hospital.   10:00am - CSW met with 2 members of the [facility] ACT Team staff and introduced them to [employer].  They met with [name] to conduct a intake assessment.  CSW will meet with the Pt and Staff after the assessment to gather any additional information.

## 2021-08-15 NOTE — Progress Notes (Signed)
Inpatient Diabetes Program Recommendations  AACE/ADA: New Consensus Statement on Inpatient Glycemic Control   Target Ranges:  Prepandial:   less than 140 mg/dL      Peak postprandial:   less than 180 mg/dL (1-2 hours)      Critically ill patients:  140 - 180 mg/dL    Latest Reference Range & Units 08/15/21 07:30  Glucose-Capillary 70 - 99 mg/dL 161 (H)    Latest Reference Range & Units 08/14/21 07:38 08/14/21 11:38 08/14/21 13:59 08/14/21 16:53 08/14/21 21:16  Glucose-Capillary 70 - 99 mg/dL 096 (H) 045 (H) 409 (H) 406 (H) 268 (H)   Review of Glycemic Control  Diabetes history: DM1 Outpatient Diabetes medications: Lantus 35 units BID, Novolog 1-15 units TID (1 unit for every 15 grams of carb) Current orders for Inpatient glycemic control: Semglee 35 units BID, Novolog 0-15 units TID with meals, Novolog 0-5 units QHS, Novolog 12 units TID with meals   Inpatient Diabetes Program Recommendations:     Insulin: Please consider increasing Semglee to 40 units BID.   Thanks, Orlando Penner, RN, MSN, CDE Diabetes Coordinator Inpatient Diabetes Program [phone] (Team Pager from 8am to 5pm)

## 2021-08-15 NOTE — BHH Suicide Risk Assessment (Addendum)
Suicide Risk Assessment  Discharge Assessment    [facility] Discharge Suicide Risk Assessment   Principal Problem: Schizoaffective disorder, bipolar type [facility]) Discharge Diagnoses: Principal Problem:   Schizoaffective disorder, bipolar type ([place]) Active Problems:   Partial epilepsy with impairment of consciousness ([place])   Generalized convulsive epilepsy ([place])   Maladaptive health behaviors affecting medical condition   Attention deficit hyperactivity disorder (ADHD), combined type, moderate   Oppositional defiant disorder   Somatic symptom disorder, persistent, moderate   Disordered eating   Borderline personality disorder ([place])   Crohn's disease of small intestine without complication (HCC)  [name] [name] is a 21 yr old male who presented on 2/6 to [facility] for worsening depression, SI with plan, and AVH, he was sent to [facility] for evaluation of reported OD on medications, he requested to leave AMA.  He then came back to [facility] on 2/9 reporting his wife would not allow him to come back home without receiving help, it was recommended that he be admitted to [facility] for observation which he declined.  On 2/10 he was brought to [facility] by police with SI plan to jump in front of a car because he wife left him.    During the patient's hospitalization, patient had extensive initial psychiatric evaluation, and follow-up psychiatric evaluations every day.  Psychiatric and medical diagnoses provided upon initial assessment: schizoaffective disorder bipolar type, GAD, PTSD, h/o borderline PD, h/o antispical PD, h/o ADHD. Stimulant use disorder. Opiate use disorder. Nicotine use disorder.   Epilepsy, hepatitis C   Patient's psychiatric medications were adjusted on admission:  -Start Thorazine 10 mg qam, 10 mg at noon, and 25 mg qhs -Start Tegretol 200 mg q12H   Gradually, patient started adjusting to milieu.   Patient's care was discussed during the interdisciplinary team meeting every day during the  hospitalization.  The patient reports not having side effects to prescribed psychiatric medication.  The patient reports their target psychiatric symptoms of mood instability and suicidal thoughts, all responded well to the psychiatric medications, and the patient reports overall benefit other psychiatric hospitalization. Supportive psychotherapy was provided to the patient. The patient also participated in regular group therapy while admitted.   Labs were reviewed with the patient, and abnormal results were discussed with the patient.  The patient denied having suicidal thoughts more than 48 hours prior to discharge.  Patient denies having homicidal thoughts.  Patient denies having auditory hallucinations.  Patient denies any visual hallucinations.  Patient denies having paranoid thoughts.  The patient is able to verbalize their individual safety plan to this provider.  It is recommended to the patient to continue psychiatric medications as prescribed, after discharge from the hospital.    It is recommended to the patient to follow up with your outpatient psychiatric provider and PCP.  Discussed with the patient, the impact of alcohol, drugs, tobacco have been there overall psychiatric and medical wellbeing, and total abstinence from substance use was recommended the patient.  Total Time spent with patient: 30 minutes  Musculoskeletal: Strength & Muscle Tone: within normal limits Gait & Station: normal Patient leans: N/A  Psychiatric Specialty Exam  Presentation  General Appearance: Appropriate for Environment; Casual; Fairly Groomed (multiple tattoos)  Eye Contact:Good  Speech:Clear and Coherent; Normal Rate  Speech Volume:Normal  Handedness:Right   Mood and Affect  Mood:-- (good, hopeful)  Duration of Depression Symptoms: Less than two weeks  Affect:Appropriate; Congruent   Thought Process  Thought Processes:Coherent; Goal Directed  Descriptions of  Associations:Intact  Orientation:Full (Time, Place and  Person)  Thought Content:Logical  History of Schizophrenia/Schizoaffective disorder:No  Duration of Psychotic Symptoms:Greater than six months  Hallucinations:Hallucinations: None  Ideas of Reference:None  Suicidal Thoughts:Suicidal Thoughts: No  Homicidal Thoughts:Homicidal Thoughts: No   Sensorium  Memory:Immediate Fair; Recent Fair  Judgment:Fair  Insight:Fair   Executive Functions  Concentration:Good  Attention Span:Good  Recall:Good  Fund of Knowledge:Good  Language:Good   Psychomotor Activity  Psychomotor Activity:Psychomotor Activity: Normal   Assets  Assets:Communication Skills; Desire for Improvement; Social Support; Resilience; Physical Health   Sleep  Sleep:Sleep: Good   Physical Exam: Physical Exam Vitals and nursing note reviewed.  Constitutional:      General: He is not in acute distress.    Appearance: Normal appearance. He is normal weight. He is not ill-appearing or toxic-appearing.  HENT:     Head: Normocephalic and atraumatic.  Pulmonary:     Effort: Pulmonary effort is normal.  Musculoskeletal:        General: Normal range of motion.  Neurological:     General: No focal deficit present.     Mental Status: He is alert.   Review of Systems  Respiratory:  Negative for cough and shortness of breath.   Cardiovascular:  Negative for chest pain.  Gastrointestinal:  Negative for abdominal pain, constipation, diarrhea, nausea and vomiting.  Neurological:  Negative for dizziness, weakness and headaches.  Psychiatric/Behavioral:  Negative for depression, hallucinations and suicidal ideas. The patient is not nervous/anxious.    Blood pressure 131/88, pulse 92, temperature 98 F (36.7 C), temperature source Oral, resp. rate 16, height 5' 10.5" (1.791 m), weight 77.5 kg, SpO2 99 %. Body mass index is 24.16 kg/m.  Mental Status Per Nursing Assessment::   On Admission:  Self-harm  thoughts, Self-harm behaviors  Demographic Factors:  Male, Adolescent or young adult, and Unemployed  Loss Factors: Decline in physical health  Historical Factors: Prior suicide attempts and Impulsivity  Risk Reduction Factors:   Responsible for children under 68 years of age, Sense of responsibility to family, Living with another person, especially a relative, and Positive therapeutic relationship  Continued Clinical Symptoms:  Personality Disorders:   Cluster B More than one psychiatric diagnosis Previous Psychiatric Diagnoses and Treatments Medical Diagnoses and Treatments/Surgeries  Cognitive Features That Contribute To Risk:  None    Suicide Risk:  Mild: There are no identifiable suicide plans, no associated intent, mild dysphoria and related symptoms, and identifiable protective factors, including available and accessible social support.    Follow-up Information     [facility]. Go to.   Specialty: Behavioral Health Why: Please go to this provider for therapy and medication management services during walk in hours:  Monday through Wednesday from 7:45 am to 11:00 am.  Services are provided on a first come, first served basis. Contact information: [address] [place] [address] [phone]        [facility] Follow up.   Why: A referral was sent to this agency for ACT Services.  You may also inquire about Anger Management services at this agency. Contact information: [address] [place] [address] [phone]         [facility] Follow up.   Specialty: Professional Counselor Why: You may use walk-in hours Monday through Friday 8:30am to 12:00pm and 1:00pm to 2:30pm to sign-up for anger management groups and therapy services. Contact information: [employer] of the [place] [address] [place] [address] [phone]  [facility] [employer] [facility] Follow up.   Why: A referral has been sent to this provider for the [facility]. Contact information: Address: [address] [place], [place] [address]  Phone: [phone] Fax: [phone]                Plan Of Care/Follow-up recommendations:  Activity: as tolerated  Diet: heart healthy  Other: -Follow-up with your outpatient psychiatric provider -instructions on appointment date, time, and address (location) are provided to you in discharge paperwork.  -Take your psychiatric medications as prescribed at discharge - instructions are provided to you in the discharge paperwork  -Follow-up with outpatient primary care doctor and other specialists -for management of chronic medical disease, including: Diabetes and Hep C infection  -Testing: Follow-up with outpatient provider for abnormal lab results: Elevated Liver Enzymes   -Recommend abstinence from alcohol, tobacco, and other illicit drug use at discharge.   -If your psychiatric symptoms recur, worsen, or if you have side effects to your psychiatric medications, call your outpatient psychiatric provider, [phone] or go to the nearest emergency department.  -If suicidal thoughts recur, call your outpatient psychiatric provider, 911, [phone] or go to the nearest emergency department.   Briant Cedar, MD 08/15/2021, 9:43 AM  Total Time Spent in Direct Patient Care:  I personally spent 45 minutes on the unit in direct patient care. The direct patient care time included face-to-face time with the patient, reviewing the patient's chart, communicating with other professionals, and coordinating care. Greater than 50% of this time was spent in counseling or coordinating care with the patient regarding goals of hospitalization, psycho-education, and discharge planning needs.  On my assessment the patient denied SI, HI, AVH, paranoia, ideas of reference, or first rank symptoms on  day of discharge. Patient denied drug cravings or active signs of withdrawal. Patient denied medication side-effects. Patient was not deemed to be a danger to self or others on day of discharge and was in agreement with discharge plans.   I have independently evaluated the patient during a face-to-face assessment on the day of discharge. I reviewed the patient's chart, and I participated in key portions of the service. I discussed the case with the resident physician, and I agree with the assessment and plan of care as documented in the resident physician's note, as addended by me or notated below:  I directly edited the [place], as above.   Janine Limbo, MD Psychiatrist

## 2021-08-15 NOTE — BHH Counselor (Signed)
CSW attempted to contact the [facility] [phone] to get the Referral ID number.  CSW left a HIPAA compliant voicemail asking for a return call with the Referral ID or stated that the individual could contact the Pt at one of the 2 numbers on their referral form since the Pt is being discharged from the hospital today.  If CSW receives a follow-up call CSW will contact the Pt to provide him with the referral ID number.

## 2021-08-15 NOTE — Progress Notes (Signed)
°  [facility] Adult Case Management Discharge Plan :  Will you be returning to the same living situation after discharge:  Yes,  Home with Wife At discharge, do you have transportation home?: Yes,  Safe Transport  Do you have the ability to pay for your medications: Yes,  Medicaid   Release of information consent forms completed and in the chart;  Patient's signature needed at discharge.  Patient to Follow up at:  Follow-up Information     [facility]. Go to.   Specialty: Behavioral Health Why: Please go to this provider for therapy and medication management services during walk in hours:  Monday through Wednesday from 7:45 am to 11:00 am.  Services are provided on a first come, first served basis. Contact information: [address] [place] [address] [phone]        [facility] Follow up.   Why: A referral was sent to this agency for ACT Services.  You may also inquire about Anger Management services at this agency. Contact information: [address] [place] [address] [phone]         [facility] Follow up.   Specialty: Professional Counselor Why: You may use walk-in hours Monday through Friday 8:30am to 12:00pm and 1:00pm to 2:30pm to sign-up for anger management groups and therapy services. Contact information: [facility] [place] [address] [place] [address] [phone]         [facility] [employer] [employer] [facility] Follow up.   Why: A referral has been sent to this provider for the [facility]. Contact information: Address: [address] [place], [place] [address]  Phone: [phone] Fax: [phone]                Next level of care provider has access to [facility] Link:yes  Safety Planning and Suicide Prevention discussed: Yes,  with patient and wife      Has patient been referred to the  Quitline?: Patient refused referral  Patient has been referred for addiction treatment: N/A  Aram Beecham, LCSWA 08/15/2021, 10:13 AM

## 2021-08-15 NOTE — Progress Notes (Signed)
Pt discharged to lobby. Pt was stable and appreciative at that time. All papers and prescriptions were given and valuables returned. Verbal understanding expressed. Denies SI/HI and A/VH. Pt given opportunity to express concerns and ask questions.  

## 2021-09-05 ENCOUNTER — Emergency Department (HOSPITAL_BASED_OUTPATIENT_CLINIC_OR_DEPARTMENT_OTHER)
Admission: EM | Admit: 2021-09-05 | Discharge: 2021-09-05 | Discharge status: Against Medical Advice | Payer: Medicaid Other | Attending: Emergency Medicine | Admitting: Emergency Medicine

## 2021-09-05 ENCOUNTER — Other Ambulatory Visit: Payer: Self-pay

## 2021-09-05 ENCOUNTER — Encounter (HOSPITAL_BASED_OUTPATIENT_CLINIC_OR_DEPARTMENT_OTHER): Payer: Self-pay | Admitting: Emergency Medicine

## 2021-09-05 DIAGNOSIS — Z5321 Procedure and treatment not carried out due to patient leaving prior to being seen by health care provider: Secondary | ICD-10-CM | POA: Diagnosis not present

## 2021-09-05 DIAGNOSIS — E1065 Type 1 diabetes mellitus with hyperglycemia: Secondary | ICD-10-CM | POA: Diagnosis not present

## 2021-09-05 DIAGNOSIS — R109 Unspecified abdominal pain: Secondary | ICD-10-CM | POA: Insufficient documentation

## 2021-09-05 DIAGNOSIS — F1590 Other stimulant use, unspecified, uncomplicated: Secondary | ICD-10-CM | POA: Insufficient documentation

## 2021-09-05 DIAGNOSIS — R002 Palpitations: Secondary | ICD-10-CM | POA: Diagnosis not present

## 2021-09-05 LAB — CBG MONITORING, ED: Glucose-Capillary: 136 mg/dL — ABNORMAL HIGH (ref 70–99)

## 2021-09-05 NOTE — ED Notes (Signed)
Pt states " my mind is all over the place", states took meth PTA ?Pt diaphoretic, denies any pain at this time ?Given Ice chips per request  ?

## 2021-09-05 NOTE — ED Triage Notes (Addendum)
Pt in with hyperglycemia - "high" readings on home CBG machine. States hx of DM1, has been very thirsty, some abdominal pain and emesis. CBG 136 in triage. At the end of triage, pt endorsed usage of meth just prior to arrival. Pt reporting palpitations, HR 120-130's ?

## 2021-09-05 NOTE — ED Notes (Signed)
Pt request to leave states ride is here to pick him up. Pt informed recommend to be evaluated. Pt A&O X 4. With family present.  ?

## 2021-09-30 ENCOUNTER — Inpatient Hospital Stay (HOSPITAL_COMMUNITY): Payer: Medicaid Other

## 2021-09-30 ENCOUNTER — Inpatient Hospital Stay (HOSPITAL_COMMUNITY)
Admission: EM | Admit: 2021-09-30 | Discharge: 2021-10-01 | DRG: 637 | Discharge status: Against Medical Advice | Payer: Medicaid Other | Attending: Pulmonary Disease | Admitting: Pulmonary Disease

## 2021-09-30 ENCOUNTER — Encounter (HOSPITAL_COMMUNITY): Payer: Self-pay | Admitting: Emergency Medicine

## 2021-09-30 ENCOUNTER — Other Ambulatory Visit: Payer: Self-pay

## 2021-09-30 DIAGNOSIS — F419 Anxiety disorder, unspecified: Secondary | ICD-10-CM | POA: Diagnosis present

## 2021-09-30 DIAGNOSIS — R7989 Other specified abnormal findings of blood chemistry: Secondary | ICD-10-CM

## 2021-09-30 DIAGNOSIS — Z20822 Contact with and (suspected) exposure to covid-19: Secondary | ICD-10-CM | POA: Diagnosis present

## 2021-09-30 DIAGNOSIS — D75839 Thrombocytosis, unspecified: Secondary | ICD-10-CM

## 2021-09-30 DIAGNOSIS — Z794 Long term (current) use of insulin: Secondary | ICD-10-CM | POA: Diagnosis not present

## 2021-09-30 DIAGNOSIS — Z8249 Family history of ischemic heart disease and other diseases of the circulatory system: Secondary | ICD-10-CM

## 2021-09-30 DIAGNOSIS — E1010 Type 1 diabetes mellitus with ketoacidosis without coma: Secondary | ICD-10-CM | POA: Diagnosis not present

## 2021-09-30 DIAGNOSIS — Z79899 Other long term (current) drug therapy: Secondary | ICD-10-CM | POA: Diagnosis not present

## 2021-09-30 DIAGNOSIS — E871 Hypo-osmolality and hyponatremia: Secondary | ICD-10-CM | POA: Diagnosis present

## 2021-09-30 DIAGNOSIS — R34 Anuria and oliguria: Secondary | ICD-10-CM | POA: Diagnosis present

## 2021-09-30 DIAGNOSIS — Z833 Family history of diabetes mellitus: Secondary | ICD-10-CM | POA: Diagnosis not present

## 2021-09-30 DIAGNOSIS — F259 Schizoaffective disorder, unspecified: Secondary | ICD-10-CM | POA: Diagnosis present

## 2021-09-30 DIAGNOSIS — F17210 Nicotine dependence, cigarettes, uncomplicated: Secondary | ICD-10-CM | POA: Diagnosis present

## 2021-09-30 DIAGNOSIS — G9341 Metabolic encephalopathy: Secondary | ICD-10-CM | POA: Diagnosis not present

## 2021-09-30 DIAGNOSIS — D75838 Other thrombocytosis: Secondary | ICD-10-CM | POA: Diagnosis present

## 2021-09-30 DIAGNOSIS — G40909 Epilepsy, unspecified, not intractable, without status epilepticus: Secondary | ICD-10-CM | POA: Diagnosis present

## 2021-09-30 DIAGNOSIS — G928 Other toxic encephalopathy: Secondary | ICD-10-CM | POA: Diagnosis present

## 2021-09-30 DIAGNOSIS — D573 Sickle-cell trait: Secondary | ICD-10-CM | POA: Diagnosis present

## 2021-09-30 DIAGNOSIS — F913 Oppositional defiant disorder: Secondary | ICD-10-CM | POA: Diagnosis present

## 2021-09-30 DIAGNOSIS — E870 Hyperosmolality and hypernatremia: Secondary | ICD-10-CM | POA: Diagnosis not present

## 2021-09-30 DIAGNOSIS — N179 Acute kidney failure, unspecified: Secondary | ICD-10-CM | POA: Diagnosis present

## 2021-09-30 DIAGNOSIS — Z91013 Allergy to seafood: Secondary | ICD-10-CM | POA: Diagnosis not present

## 2021-09-30 DIAGNOSIS — E1110 Type 2 diabetes mellitus with ketoacidosis without coma: Secondary | ICD-10-CM | POA: Diagnosis present

## 2021-09-30 LAB — URINALYSIS, ROUTINE W REFLEX MICROSCOPIC
Bilirubin Urine: NEGATIVE
Glucose, UA: >=500 mg/dL — AB
Hgb urine dipstick: NEGATIVE
Ketones, ur: 80 mg/dL — AB
Leukocytes,Ua: NEGATIVE
Nitrite: NEGATIVE
Protein, ur: NEGATIVE mg/dL
Specific Gravity, Urine: 1.014 (ref 1.005–1.030)
pH: 5 (ref 5.0–8.0)

## 2021-09-30 LAB — BETA-HYDROXYBUTYRIC ACID: Beta-Hydroxybutyric Acid: >8 mmol/L — ABNORMAL HIGH (ref 0.05–0.27)

## 2021-09-30 LAB — I-STAT VENOUS BLOOD GAS, ED
Acid-base deficit: 30 mmol/L — ABNORMAL HIGH (ref 0.0–2.0)
Bicarbonate: 3.5 mmol/L — ABNORMAL LOW (ref 20.0–28.0)
Calcium, Ion: 1.1 mmol/L — ABNORMAL LOW (ref 1.15–1.40)
HCT: 46 % (ref 39.0–52.0)
Hemoglobin: 15.6 g/dL (ref 13.0–17.0)
O2 Saturation: 70 %
Potassium: 5.2 mmol/L — ABNORMAL HIGH (ref 3.5–5.1)
Sodium: 126 mmol/L — ABNORMAL LOW (ref 135–145)
TCO2: <5 mmol/L — ABNORMAL LOW (ref 22–32)
pCO2, Ven: 20.8 mmHg — ABNORMAL LOW (ref 44–60)
pH, Ven: 6.84 — CL (ref 7.25–7.43)
pO2, Ven: 64 mmHg — ABNORMAL HIGH (ref 32–45)

## 2021-09-30 LAB — BASIC METABOLIC PANEL
Anion gap: 29 — ABNORMAL HIGH (ref 5–15)
Anion gap: 17 — ABNORMAL HIGH (ref 5–15)
Anion gap: 13 (ref 5–15)
Anion gap: 7 (ref 5–15)
BUN: 23 mg/dL — ABNORMAL HIGH (ref 6–20)
BUN: 20 mg/dL (ref 6–20)
BUN: 15 mg/dL (ref 6–20)
BUN: 12 mg/dL (ref 6–20)
CO2: 8 mmol/L — ABNORMAL LOW (ref 22–32)
CO2: 12 mmol/L — ABNORMAL LOW (ref 22–32)
CO2: 18 mmol/L — ABNORMAL LOW (ref 22–32)
CO2: 23 mmol/L (ref 22–32)
Calcium: 8.5 mg/dL — ABNORMAL LOW (ref 8.9–10.3)
Calcium: 7.7 mg/dL — ABNORMAL LOW (ref 8.9–10.3)
Calcium: 8 mg/dL — ABNORMAL LOW (ref 8.9–10.3)
Calcium: 7.8 mg/dL — ABNORMAL LOW (ref 8.9–10.3)
Chloride: 98 mmol/L (ref 98–111)
Chloride: 113 mmol/L — ABNORMAL HIGH (ref 98–111)
Chloride: 116 mmol/L — ABNORMAL HIGH (ref 98–111)
Chloride: 120 mmol/L — ABNORMAL HIGH (ref 98–111)
Creatinine, Ser: 1.93 mg/dL — ABNORMAL HIGH (ref 0.61–1.24)
Creatinine, Ser: 1.45 mg/dL — ABNORMAL HIGH (ref 0.61–1.24)
Creatinine, Ser: 1.22 mg/dL (ref 0.61–1.24)
Creatinine, Ser: 0.93 mg/dL (ref 0.61–1.24)
GFR, Estimated: 50 mL/min — ABNORMAL LOW (ref >60)
GFR, Estimated: >60 mL/min (ref >60)
GFR, Estimated: >60 mL/min (ref >60)
GFR, Estimated: >60 mL/min (ref >60)
Glucose, Bld: 804 mg/dL (ref 70–99)
Glucose, Bld: 390 mg/dL — ABNORMAL HIGH (ref 70–99)
Glucose, Bld: 233 mg/dL — ABNORMAL HIGH (ref 70–99)
Glucose, Bld: 195 mg/dL — ABNORMAL HIGH (ref 70–99)
Potassium: 5.3 mmol/L — ABNORMAL HIGH (ref 3.5–5.1)
Potassium: 4.1 mmol/L (ref 3.5–5.1)
Potassium: 3.8 mmol/L (ref 3.5–5.1)
Potassium: 3.7 mmol/L (ref 3.5–5.1)
Sodium: 135 mmol/L (ref 135–145)
Sodium: 142 mmol/L (ref 135–145)
Sodium: 147 mmol/L — ABNORMAL HIGH (ref 135–145)
Sodium: 150 mmol/L — ABNORMAL HIGH (ref 135–145)

## 2021-09-30 LAB — CBC
HCT: 44.1 % (ref 39.0–52.0)
Hemoglobin: 13.9 g/dL (ref 13.0–17.0)
MCH: 29.8 pg (ref 26.0–34.0)
MCHC: 31.5 g/dL (ref 30.0–36.0)
MCV: 94.4 fL (ref 80.0–100.0)
Platelets: 429 10*3/uL — ABNORMAL HIGH (ref 150–400)
RBC: 4.67 MIL/uL (ref 4.22–5.81)
RDW: 13.3 % (ref 11.5–15.5)
WBC: 37.2 10*3/uL — ABNORMAL HIGH (ref 4.0–10.5)
nRBC: 0.1 % (ref 0.0–0.2)

## 2021-09-30 LAB — COMPREHENSIVE METABOLIC PANEL
ALT: 234 U/L — ABNORMAL HIGH (ref 0–44)
AST: 240 U/L — ABNORMAL HIGH (ref 15–41)
Albumin: 3.7 g/dL (ref 3.5–5.0)
Alkaline Phosphatase: 194 U/L — ABNORMAL HIGH (ref 38–126)
BUN: 23 mg/dL — ABNORMAL HIGH (ref 6–20)
CO2: <7 mmol/L — ABNORMAL LOW (ref 22–32)
Calcium: 8.6 mg/dL — ABNORMAL LOW (ref 8.9–10.3)
Chloride: 91 mmol/L — ABNORMAL LOW (ref 98–111)
Creatinine, Ser: 2.06 mg/dL — ABNORMAL HIGH (ref 0.61–1.24)
GFR, Estimated: 46 mL/min — ABNORMAL LOW (ref >60)
Glucose, Bld: 941 mg/dL (ref 70–99)
Potassium: 5.3 mmol/L — ABNORMAL HIGH (ref 3.5–5.1)
Sodium: 129 mmol/L — ABNORMAL LOW (ref 135–145)
Total Bilirubin: 2 mg/dL — ABNORMAL HIGH (ref 0.3–1.2)
Total Protein: 6.8 g/dL (ref 6.5–8.1)

## 2021-09-30 LAB — POCT I-STAT EG7
Acid-base deficit: 14 mmol/L — ABNORMAL HIGH (ref 0.0–2.0)
Bicarbonate: 10.2 mmol/L — ABNORMAL LOW (ref 20.0–28.0)
Calcium, Ion: 1.16 mmol/L (ref 1.15–1.40)
HCT: 35 % — ABNORMAL LOW (ref 39.0–52.0)
Hemoglobin: 11.9 g/dL — ABNORMAL LOW (ref 13.0–17.0)
O2 Saturation: 68 %
Patient temperature: 36.5
Potassium: 3.9 mmol/L (ref 3.5–5.1)
Sodium: 146 mmol/L — ABNORMAL HIGH (ref 135–145)
TCO2: 11 mmol/L — ABNORMAL LOW (ref 22–32)
pCO2, Ven: 20.3 mmHg — ABNORMAL LOW (ref 44–60)
pH, Ven: 7.308 (ref 7.25–7.43)
pO2, Ven: 36 mmHg (ref 32–45)

## 2021-09-30 LAB — GLUCOSE, CAPILLARY
Glucose-Capillary: 154 mg/dL — ABNORMAL HIGH (ref 70–99)
Glucose-Capillary: 160 mg/dL — ABNORMAL HIGH (ref 70–99)
Glucose-Capillary: 169 mg/dL — ABNORMAL HIGH (ref 70–99)
Glucose-Capillary: 174 mg/dL — ABNORMAL HIGH (ref 70–99)
Glucose-Capillary: 184 mg/dL — ABNORMAL HIGH (ref 70–99)
Glucose-Capillary: 193 mg/dL — ABNORMAL HIGH (ref 70–99)
Glucose-Capillary: 196 mg/dL — ABNORMAL HIGH (ref 70–99)
Glucose-Capillary: 204 mg/dL — ABNORMAL HIGH (ref 70–99)
Glucose-Capillary: 230 mg/dL — ABNORMAL HIGH (ref 70–99)
Glucose-Capillary: 271 mg/dL — ABNORMAL HIGH (ref 70–99)
Glucose-Capillary: 287 mg/dL — ABNORMAL HIGH (ref 70–99)
Glucose-Capillary: 293 mg/dL — ABNORMAL HIGH (ref 70–99)
Glucose-Capillary: 340 mg/dL — ABNORMAL HIGH (ref 70–99)
Glucose-Capillary: 396 mg/dL — ABNORMAL HIGH (ref 70–99)
Glucose-Capillary: >600 mg/dL (ref 70–99)
Glucose-Capillary: >600 mg/dL (ref 70–99)
Glucose-Capillary: >600 mg/dL (ref 70–99)
Glucose-Capillary: >600 mg/dL (ref 70–99)
Glucose-Capillary: >600 mg/dL (ref 70–99)
Glucose-Capillary: >600 mg/dL (ref 70–99)
Glucose-Capillary: >600 mg/dL (ref 70–99)

## 2021-09-30 LAB — RAPID URINE DRUG SCREEN, HOSP PERFORMED
Amphetamines: POSITIVE — AB
Barbiturates: NOT DETECTED
Benzodiazepines: NOT DETECTED
Cocaine: NOT DETECTED
Opiates: NOT DETECTED
Tetrahydrocannabinol: NOT DETECTED

## 2021-09-30 LAB — ETHANOL: Alcohol, Ethyl (B): <10 mg/dL (ref <10)

## 2021-09-30 LAB — BLOOD GAS, VENOUS
Acid-base deficit: 25.4 mmol/L — ABNORMAL HIGH (ref 0.0–2.0)
Bicarbonate: 4.9 mmol/L — ABNORMAL LOW (ref 20.0–28.0)
O2 Saturation: 75.4 %
Patient temperature: 37
pCO2, Ven: 21 mmHg — ABNORMAL LOW (ref 44–60)
pH, Ven: 6.98 — CL (ref 7.25–7.43)
pO2, Ven: 47 mmHg — ABNORMAL HIGH (ref 32–45)

## 2021-09-30 LAB — CARBAMAZEPINE LEVEL, TOTAL: Carbamazepine Lvl: <2 ug/mL — ABNORMAL LOW (ref 4.0–12.0)

## 2021-09-30 LAB — LIPASE, BLOOD: Lipase: 27 U/L (ref 11–51)

## 2021-09-30 LAB — LACTIC ACID, PLASMA
Lactic Acid, Venous: 8 mmol/L (ref 0.5–1.9)
Lactic Acid, Venous: 4.2 mmol/L (ref 0.5–1.9)

## 2021-09-30 LAB — CBG MONITORING, ED
Glucose-Capillary: >600 mg/dL (ref 70–99)
Glucose-Capillary: >600 mg/dL (ref 70–99)

## 2021-09-30 LAB — RESP PANEL BY RT-PCR (FLU A&B, COVID) ARPGX2
Influenza A by PCR: NEGATIVE
Influenza B by PCR: NEGATIVE
SARS Coronavirus 2 by RT PCR: NEGATIVE

## 2021-09-30 LAB — HIV ANTIBODY (ROUTINE TESTING W REFLEX): HIV Screen 4th Generation wRfx: NONREACTIVE

## 2021-09-30 LAB — MAGNESIUM: Magnesium: 3.4 mg/dL — ABNORMAL HIGH (ref 1.7–2.4)

## 2021-09-30 LAB — MRSA NEXT GEN BY PCR, NASAL: MRSA by PCR Next Gen: NOT DETECTED

## 2021-09-30 MED ORDER — LACTATED RINGERS IV SOLN: INTRAVENOUS | Status: DC

## 2021-09-30 MED ORDER — STERILE WATER FOR INJECTION IV SOLN
INTRAVENOUS | Status: DC
  Filled 2021-09-30: qty 1000

## 2021-09-30 MED ORDER — SODIUM CHLORIDE 0.9 % IV BOLUS
2000.0000 mL | Freq: Once | INTRAVENOUS | Status: AC
  Administered 2021-09-30 (×2): 1000 mL via INTRAVENOUS

## 2021-09-30 MED ORDER — DEXTROSE IN LACTATED RINGERS 5 % IV SOLN: INTRAVENOUS | Status: DC

## 2021-09-30 MED ORDER — CHLORHEXIDINE GLUCONATE CLOTH 2 % EX PADS
6.0000 | MEDICATED_PAD | Freq: Every day | CUTANEOUS | Status: DC
  Administered 2021-09-30 – 2021-10-01 (×2): 6 via TOPICAL

## 2021-09-30 MED ORDER — LORAZEPAM 2 MG/ML IJ SOLN
2.0000 mg | Freq: Once | INTRAMUSCULAR | Status: AC
  Administered 2021-09-30: 2 mg via INTRAVENOUS
  Filled 2021-09-30: qty 1

## 2021-09-30 MED ORDER — SODIUM CHLORIDE 0.9 % IV BOLUS
1000.0000 mL | Freq: Once | INTRAVENOUS | Status: AC
  Administered 2021-09-30: 1000 mL via INTRAVENOUS

## 2021-09-30 MED ORDER — LACTATED RINGERS IV BOLUS
20.0000 mL/kg | Freq: Once | INTRAVENOUS | Status: AC
  Administered 2021-09-30: 1624 mL via INTRAVENOUS

## 2021-09-30 MED ORDER — LORAZEPAM 2 MG/ML IJ SOLN
INTRAMUSCULAR | Status: AC
Start: 2021-09-30 — End: 2021-09-30
  Filled 2021-09-30: qty 1

## 2021-09-30 MED ORDER — SODIUM BICARBONATE 8.4 % IV SOLN
INTRAVENOUS | Status: AC
  Filled 2021-09-30: qty 100

## 2021-09-30 MED ORDER — KCL-LACTATED RINGERS-D5W 20 MEQ/L IV SOLN
INTRAVENOUS | Status: DC
Start: 2021-09-30 — End: 2021-10-01
  Filled 2021-09-30 (×4): qty 1000

## 2021-09-30 MED ORDER — INSULIN REGULAR(HUMAN) IN NACL 100-0.9 UT/100ML-% IV SOLN
INTRAVENOUS | Status: AC
  Administered 2021-09-30: 7 [IU]/h via INTRAVENOUS
  Filled 2021-09-30: qty 100

## 2021-09-30 MED ORDER — HALOPERIDOL LACTATE 5 MG/ML IJ SOLN
5.0000 mg | Freq: Once | INTRAMUSCULAR | Status: AC
  Administered 2021-09-30: 5 mg via INTRAVENOUS
  Filled 2021-09-30: qty 1

## 2021-09-30 MED ORDER — DEXTROSE 50 % IV SOLN: 0.0000 mL | INTRAVENOUS | Status: DC | PRN

## 2021-09-30 MED ORDER — ENOXAPARIN SODIUM 40 MG/0.4ML IJ SOSY
40.0000 mg | PREFILLED_SYRINGE | INTRAMUSCULAR | Status: DC
  Administered 2021-09-30 – 2021-10-01 (×2): 40 mg via SUBCUTANEOUS
  Filled 2021-09-30 (×2): qty 0.4

## 2021-09-30 MED ORDER — INSULIN REGULAR(HUMAN) IN NACL 100-0.9 UT/100ML-% IV SOLN
INTRAVENOUS | Status: DC
  Administered 2021-09-30: 12 [IU]/h via INTRAVENOUS
  Filled 2021-09-30: qty 100

## 2021-09-30 MED ORDER — LORAZEPAM 2 MG/ML IJ SOLN
1.0000 mg | Freq: Once | INTRAMUSCULAR | Status: AC
  Administered 2021-09-30: 1 mg via INTRAVENOUS

## 2021-09-30 MED ORDER — SODIUM BICARBONATE 8.4 % IV SOLN
100.0000 meq | Freq: Once | INTRAVENOUS | Status: AC
  Administered 2021-09-30: 100 meq via INTRAVENOUS

## 2021-09-30 MED ORDER — LACTATED RINGERS IV BOLUS
1000.0000 mL | Freq: Once | INTRAVENOUS | Status: AC
  Administered 2021-09-30: 1000 mL via INTRAVENOUS

## 2021-09-30 NOTE — Progress Notes (Signed)
Inpatient Diabetes Program Recommendations ? ?AACE/ADA: New Consensus Statement on Inpatient Glycemic Control (2015) ? ?Target Ranges:  Prepandial:   less than 140 mg/dL ?     Peak postprandial:   less than 180 mg/dL (1-2 hours) ?     Critically ill patients:  140 - 180 mg/dL  ? ?Lab Results  ?Component Value Date  ? GLUCAP 396 (H) 09/30/2021  ? HGBA1C 8.8 (H) 08/13/2021  ? ? Latest Reference Range & Units 09/30/21 07:12  ?Sodium 135 - 145 mmol/L 135  ?Potassium 3.5 - 5.1 mmol/L 5.3 (H)  ?Chloride 98 - 111 mmol/L 98  ?CO2 22 - 32 mmol/L 8 (L)  ?Glucose 70 - 99 mg/dL 426 (HH)  ?BUN 6 - 20 mg/dL 23 (H)  ?Creatinine 0.61 - 1.24 mg/dL 8.34 (H)  ?Calcium 8.9 - 10.3 mg/dL 8.5 (L)  ?Anion gap 5 - 15  29 (H)  ?[facility]): Data is critically high ?(H): Data is abnormally high ?(L): Data is abnormally low ? ? ?Review of Glycemic Control ? ?Diabetes history: DM1 ?Outpatient Diabetes medications: Lantus 35 units bid, Novolog 1 unit per 15 grams carbohydrate ?Current orders for Inpatient glycemic control: IV insulin ? ?Inpatient Diabetes Program Recommendations:   ?This is the 3rd admission for patient this year. ?DM coordinator spoke with patient on previous admission 08/06/21 and followed each admission. ? ?Agree with current IV insulin per endotool for glucose management @ this time. ?When patient meets criteria for transition to subcutaneous insulin, please give basal insulin 2 hrs. Prior to IV insulin discontinued and cover CBG with Novolog correction when IV insulin turned off. ? ?Will follow during hospitalization. ? ?Thank you, ?Billy Fischer Royale Swamy, RN, MSN, CDE  ?Diabetes Coordinator ?Inpatient Glycemic Control Team ?Team Pager [phone] (8am-5pm) ?09/30/2021 10:03 AM ? ? ? ? ? ?

## 2021-09-30 NOTE — ED Provider Notes (Signed)
?[facility] ?Provider Note ? ? ?CSN: [id number] ?Arrival date & time: 09/30/21  0224 ? ?  ? ?History ? ?Chief Complaint  ?Patient presents with  ? Altered Mental Status  ? Emesis  ? ? ?[name] [name]. is a 21 y.o. male. ? ?The history is provided by the EMS personnel.  ?Altered Mental Status ?Associated symptoms: vomiting   ?Emesis ?He has history of diabetes, borderline personality disorder, substance abuse and apparently was in his usual state of health until this evening.  He is reported to have been vomiting at home and developed altered mental status tonight.  Patient is not able to give any history.  EMS gave ondansetron for nausea, CBG was 493. ?  ?Home Medications ?Prior to Admission medications   ?Medication Sig Start Date End Date Taking? Authorizing Provider  ?albuterol (VENTOLIN HFA) 108 (90 Base) MCG/ACT inhaler Inhale 2 puffs into the lungs every 6 (six) hours as needed for wheezing or shortness of breath.    [provider]  ?blood glucose meter kit and supplies KIT Dispense based on patient and insurance preference. Use up to four times daily as directed. (FOR ICD-9 250.00, 250.01). 11/09/20   Florencia Reasons, MD  ?carbamazepine (TEGRETOL) 200 MG tablet Take 1 tablet (200 mg total) by mouth every 12 (twelve) hours. 08/15/21 09/14/21  Briant Cedar, MD  ?chlorproMAZINE (THORAZINE) 10 MG tablet Take 1 tablet (10 mg total) by mouth 2 (two) times daily. 08/15/21 09/14/21  Briant Cedar, MD  ?chlorproMAZINE (THORAZINE) 25 MG tablet Take 1 tablet (25 mg total) by mouth at bedtime. 08/15/21 09/14/21  Briant Cedar, MD  ?EPINEPHrine 0.3 mg/0.3 mL IJ SOAJ injection Inject 0.3 mg into the muscle once as needed for anaphylaxis (severe allergic reaction). ?Patient not taking: Reported on 08/06/2021 05/02/20   [provider]  ?glucose blood (FREESTYLE TEST STRIPS) test strip Use as instructed 11/09/20   Florencia Reasons, MD  ?insulin aspart (NOVOLOG  FLEXPEN) 100 UNIT/ML FlexPen Inject 1-15 Units into the skin 3 (three) times daily with meals. 1 unit/g of carbohydrate sliding scale. ?Patient taking differently: Inject 1-15 Units into the skin See admin instructions. Inject 1-15 units into the skin three times a day with meals, per correction carb correction scale: 1 unit of insulin for every 15 grams of carbohydrates 11/09/20   Florencia Reasons, MD  ?insulin glargine (LANTUS) 100 UNIT/ML Solostar Pen Inject 30 Units into the skin 2 (two) times daily. ?Patient taking differently: Inject 35 Units into the skin in the morning and at bedtime. 11/09/20   Florencia Reasons, MD  ?Insulin Pen Needle (ADVOCATE INSULIN PEN NEEDLES) 29G X 12.7MM MISC For type I diabetes 11/09/20   Florencia Reasons, MD  ?lisinopril (ZESTRIL) 5 MG tablet Take 1 tablet (5 mg total) by mouth daily. 08/15/21 09/14/21  Briant Cedar, MD  ?nicotine (NICODERM CQ - DOSED IN MG/24 HOURS) 14 mg/24hr patch Place 1 patch (14 mg total) onto the skin daily. 08/15/21   Briant Cedar, MD  ?   ? ?Allergies    ?Bee venom, Fish allergy, Shellfish allergy, and Tea   ? ?Review of Systems   ?Review of Systems  ?Unable to perform ROS: Mental status change  ?Gastrointestinal:  Positive for vomiting.  ? ?Physical Exam ?Updated Vital Signs ?BP 119/84   Pulse (!) 111   Resp (!) 24   Ht _0  (1.803 m)   Wt 81.2 kg   SpO2 100%   BMI 24.97  kg/m?  ?Physical Exam ?Vitals and nursing note reviewed.  ?21 year old male, agitated and restless and tachypneic. Vital signs are significant for rapid heart rate, rapid respiratory rate. Oxygen saturation is 100%, which is normal. ?Head is normocephalic and atraumatic. PERRLA, EOMI. Mucous membranes are dry. ?Neck is nontender and supple without adenopathy or JVD. ?Back is nontender and there is no CVA tenderness. ?Lungs are clear without rales, wheezes, or rhonchi. ?Chest is nontender. ?Heart is tachycardic without murmur. ?Abdomen is soft, flat, nontender.  Peristalsis is  normoactive. ?Extremities have no cyanosis or edema, full range of motion is present. ?Skin is warm and dry without rash. ?Neurologic: Awake and restless but not responding to verbal commands.  Moves all extremities equally. ? ?ED Results / Procedures / Treatments   ?Labs ?(all labs ordered are listed, but only abnormal results are displayed) ?Labs Reviewed  ?COMPREHENSIVE METABOLIC PANEL - Abnormal; Notable for the following components:  ?    Result Value  ? Sodium 129 (*)   ? Potassium 5.3 (*)   ? Chloride 91 (*)   ? CO2 <7 (*)   ? Glucose, Bld 941 (*)   ? BUN 23 (*)   ? Creatinine, Ser 2.06 (*)   ? Calcium 8.6 (*)   ? AST 240 (*)   ? ALT 234 (*)   ? Alkaline Phosphatase 194 (*)   ? Total Bilirubin 2.0 (*)   ? GFR, Estimated 46 (*)   ? All other components within normal limits  ?BETA-HYDROXYBUTYRIC ACID - Abnormal; Notable for the following components:  ? Beta-Hydroxybutyric Acid >8.00 (*)   ? All other components within normal limits  ?MAGNESIUM - Abnormal; Notable for the following components:  ? Magnesium 3.4 (*)   ? All other components within normal limits  ?CBC - Abnormal; Notable for the following components:  ? WBC 37.2 (*)   ? Platelets 429 (*)   ? All other components within normal limits  ?BLOOD GAS, VENOUS - Abnormal; Notable for the following components:  ? pH, Ven 6.98 (*)   ? pCO2, Ven 21 (*)   ? pO2, Ven 47 (*)   ? Bicarbonate 4.9 (*)   ? Acid-base deficit 25.4 (*)   ? All other components within normal limits  ?I-STAT VENOUS BLOOD GAS, ED - Abnormal; Notable for the following components:  ? pH, Ven 6.840 (*)   ? pCO2, Ven 20.8 (*)   ? pO2, Ven 64 (*)   ? Bicarbonate 3.5 (*)   ? TCO2 <5 (*)   ? Acid-base deficit 30.0 (*)   ? Sodium 126 (*)   ? Potassium 5.2 (*)   ? Calcium, Ion 1.10 (*)   ? All other components within normal limits  ?CBG MONITORING, ED - Abnormal; Notable for the following components:  ? Glucose-Capillary >600 (*)   ? All other components within normal limits  ?CBG MONITORING, ED -  Abnormal; Notable for the following components:  ? Glucose-Capillary >600 (*)   ? All other components within normal limits  ?RESP PANEL BY RT-PCR (FLU A&B, COVID) ARPGX2  ?MRSA NEXT GEN BY PCR, NASAL  ?CULTURE, BLOOD (ROUTINE X 2)  ?CULTURE, BLOOD (ROUTINE X 2)  ?ETHANOL  ?LIPASE, BLOOD  ?RAPID URINE DRUG SCREEN, HOSP PERFORMED  ?URINALYSIS, ROUTINE W REFLEX MICROSCOPIC  ?HIV ANTIBODY (ROUTINE TESTING W REFLEX)  ?BASIC METABOLIC PANEL  ?BASIC METABOLIC PANEL  ?BASIC METABOLIC PANEL  ?BASIC METABOLIC PANEL  ?LACTIC ACID, PLASMA  ?CARBAMAZEPINE LEVEL, TOTAL  ? ? ?EKG ?  EKG Interpretation ? ?Date/Time:  Monday September 30 2021 02:40:12 EDT ?Ventricular Rate:  112 ?PR Interval:  136 ?QRS Duration: 96 ?QT Interval:  332 ?QTC Calculation: 685 ?R Axis:   75 ?Text Interpretation: Sinus tachycardia Ventricular premature complex Repol abnrm suggests ischemia, diffuse leads Borderline ST elevation, anterior leads Baseline wander in lead(s) II V4 When compared with ECG of 09/05/2021, No significant change was found Confirmed by Delora Fuel ([id number]) on 09/30/2021 2:45:35 AM ? ?Radiology ?DG CHEST PORT 1 VIEW ? ?Result Date: 09/30/2021 ?CLINICAL DATA:  21 year old male with history of shortness of breath. EXAM: PORTABLE CHEST 1 VIEW COMPARISON:  Chest x-ray 03/13/2021. FINDINGS: Lung volumes are low. No consolidative airspace disease. No pleural effusions. No pneumothorax. No pulmonary nodule or mass noted. Pulmonary vasculature and the cardiomediastinal silhouette are within normal limits. IMPRESSION: 1. Low lung volumes without radiographic evidence of acute cardiopulmonary disease. Electronically Signed   By: [name] M.D.   On: 09/30/2021 06:23   ? ?Procedures ?Procedures  ?Cardiac monitor showed sinus tachycardia, per my interpretation. ? ?Medications Ordered in ED ?Medications  ?LORazepam (ATIVAN) 2 MG/ML injection (has no administration in time range)  ?insulin regular, human (MYXREDLIN) 100 units/ 100 mL infusion (12  Units/hr Intravenous New Bag/Given 09/30/21 0330)  ?lactated ringers infusion (has no administration in time range)  ?dextrose 5 % in lactated ringers infusion (has no administration in time range)  ?dextrose 50 % solution 0-50 mL (h

## 2021-09-30 NOTE — Progress Notes (Signed)
2nd IV obtained by primary RN. ?

## 2021-09-30 NOTE — ED Triage Notes (Signed)
Pt arrive by EMS from home for alter mental status and vomiting that started one hour ago. 4 mg Zofran given by EMS pta. CBG 493. ?

## 2021-09-30 NOTE — H&P (Addendum)
? ?NAME:  [name] [name]., MRN:  [id number], DOB:  May 24, 2001, LOS: 0 ?ADMISSION DATE:  09/30/2021, CONSULTATION DATE:  4/3 ?REFERRING MD:  Roxanne Mins, CHIEF COMPLAINT:  AMS  ? ?History of Present Illness:  ?Patient is encephalopathic. Therefore history has been obtained from chart review. No family at bedside ? ?Montez [name]., is a 21 y.o. male, who presented to the [facility] ED with a chief complaint of AMS and vomiting ? ?They have a pertinent past medical history of type 1 diabetes, anxiety, asthma, depression, seizure disorder, ODD, schizoaffective disorder, BPD 1 ? ?Presented from home via EMS with altered mental status and vomiting.  ? ?ED course was notable for VBG 6.84/20.8/64/3.5, glucose greater than 600.  On exam patient with altered mental status and kussmaul breathing. ? ?PCCM was consulted for for admission ? ?Pertinent  Medical History  ?type 1 diabetes, anxiety, asthma, depression, seizure disorder, ODD, schizoaffective disorder, BPD 1 ? ?Significant Hospital Events: ?Including procedures, antibiotic start and stop dates in addition to other pertinent events   ?4/3 presented to [facility], PCCM consult ? ?Interim History / Subjective:  ?See above ? ?Unable to obtain subjective evaluation due to patient status ? ? ?Objective   ?Blood pressure 133/68, pulse (!) 111, resp. rate (!) 29, height _0  (1.803 m), weight 81.2 kg, SpO2 100 %. ?   ?   ?No intake or output data in the 24 hours ending 09/30/21 0333 ?Filed Weights  ? 09/30/21 0231  ?Weight: 81.2 kg  ? ? ?Examination: ?General: In bed, unwell appearing,  ?HEENT: MM pink/dry, anicteric, atraumatic ?Neuro: MAE, RASS -2, kussmal respirations  ?CV: S1S2, ST, no m/r/g appreciated ?PULM:  clear in the upper lobes, clear in the lower lobes, trachea midline, chest expansion symmetric ?GI: soft, bsx4 active, non-tender   ?Extremities: warm/dry, no pretibial edema, capillary refill less than 3 seconds  ?Skin:  no rashes or lesions noted ? ?Labs ?VBG  6.84/20.8/64/3.5 ?I-STAT: Sodium 126, potassium 5.2, hemoglobin 15.6 ?Ethanol: Within normal limits ?Lipase: Within normal meds  ?BHA pending ?Magnesium 3.4 ?CMP pending ?CBC pending ?Twelve-lead: Sinus tach, no significant ST changes noted. ? ?Resolved Hospital Problem list   ? ? ?Assessment & Plan:  ?Suspect DKA Severe-VBG 6.84/20.8/64/3.5, glucose greater than 600.  On exam patient with altered mental status and kussmaul breathing.  Ethanol WNL, lipase WNL. Recently admitted to [facility] on 3/25-3/28 for DKA ?DM type 1 on insulin ?Acute metabolic encephalopathy, suspect secondary to DKA ?Nausea and vomiting, suspect secondary to DKA ?P: ?-Aggressive IV hydration with LR, start with 2L LR bolus, 150 ml/hr ?-Insulin drip per endo tool ?-Closely monitor patient's electrolytes with frequent Bmets especially potassium ?-Maintain potassium greater than 5-5.3 ?-Accu-Cheks every 1 hour ?-Once blood glucose falls below 250 start patient on D5 IV fluids ?-Start long acting insulin  ?-Monitor lactate ?-Resume home insulin regimen once appropriate ?-Admit to ICU with telemetry and continuous SPO2 monitoring ?-Follow-up BHA, CBC, CMP ? ?Hyponatremia, suspect pseudohyponatremia ?Unable to calculate corrected sodium for glucose at this time ?-Supportive care as above ? ?HX anxiety, depression, seizure disorder, ODD, schizoaffective disorder, BPD 1 ?-Unable to take p.o. at this time.  Resume meds and appropriate ? ?HX seizure disorder ?-Unable to take p.o. at this time.  Resume meds and appropriate. Tegretol stopped at [facility]. ? ?Best Practice (right click and "Reselect all SmartList Selections" daily)  ? ?Diet/type: NPO ?DVT prophylaxis: LMWH ?GI prophylaxis: N/A ?Lines: N/A ?Foley:  N/A ?Code Status:  full code ?Last date of multidisciplinary  goals of care discussion [pending, called wife Melquan Ernsberger at [phone], no answer] ? ?Labs   ?CBC: ?Recent Labs  ?Lab 09/30/21 ?0251  ?HGB 15.6  ?HCT 46.0  ? ? ?Basic Metabolic  Panel: ?Recent Labs  ?Lab 09/30/21 ?4696 09/30/21 ?0251  ?NA  --  126*  ?K  --  5.2*  ?MG 3.4*  --   ? ?GFR: ?CrCl cannot be calculated (Patient's most recent lab result is older than the maximum 21 days allowed.). ?No results for input(s): PROCALCITON, WBC, LATICACIDVEN in the last 168 hours. ? ?Liver Function Tests: ?No results for input(s): AST, ALT, ALKPHOS, BILITOT, PROT, ALBUMIN in the last 168 hours. ?Recent Labs  ?Lab 09/30/21 ?0245  ?LIPASE 27  ? ?No results for input(s): AMMONIA in the last 168 hours. ? ?ABG ?   ?Component Value Date/Time  ? PHART 7.311 (L) 09/24/2018 0003  ? PCO2ART 28.4 (L) 09/24/2018 0003  ? PO2ART 103.0 09/24/2018 0003  ? HCO3 3.5 (L) 09/30/2021 0251  ? TCO2 <5 (L) 09/30/2021 0251  ? ACIDBASEDEF 30.0 (H) 09/30/2021 0251  ? O2SAT 70 09/30/2021 0251  ?  ? ?Coagulation Profile: ?No results for input(s): INR, PROTIME in the last 168 hours. ? ?Cardiac Enzymes: ?No results for input(s): CKTOTAL, CKMB, CKMBINDEX, TROPONINI in the last 168 hours. ? ?HbA1C: ?Hgb A1c MFr Bld  ?Date/Time Value Ref Range Status  ?08/13/2021 07:01 PM 8.8 (H) 4.8 - 5.6 % Final  ?  Comment:  ?  (NOTE) ?Pre diabetes:          5.7%-6.4% ? ?Diabetes:              >6.4% ? ?Glycemic control for   <7.0% ?adults with diabetes ?  ?11/09/2020 06:15 AM 10.5 (H) 4.8 - 5.6 % Final  ?  Comment:  ?  (NOTE) ?Pre diabetes:          5.7%-6.4% ? ?Diabetes:              >6.4% ? ?Glycemic control for   <7.0% ?adults with diabetes ?  ? ? ?CBG: ?Recent Labs  ?Lab 09/30/21 ?0232  ?GLUCAP >600*  ? ? ?Review of Systems:   ?Unable to obtain ROS due to patient status ? 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  ? ?Surgical History:  ? ?Past Surgical History:   ?Procedure Laterality Date  ? CIRCUMCISION  2000  ? FINGER SURGERY Left 2001  ? "crushed pinky; had to repair it" (06/17/2013)  ?  ? ?Social History:  ? reports that he has been smoking cigarettes. He has been smoking an average of .5 packs per day. He has never used smokeless tobacco. He reports current drug use. Drug: Methamphetamines. He reports that he does not drink alcohol.  ? ?Family History:  ?His family history includes Asthma in his mother; COPD in his mother; Diabetes in his maternal grandmother; Heart disease in his maternal grandfather and maternal grandmother; Hyperlipidemia in his paternal grandfather and paternal grandmother; Hypertension in his maternal grandmother; Mental illness in his maternal grandmother; Migraines in his sister; Other in his mother.  ? ?Allergies ?Allergies  ?Allergen Reactions  ? Bee Venom Anaphylaxis  ? Fish Allergy Anaphylaxis  ? Shellfish Allergy Anaphylaxis  ? Tea Anaphylaxis  ?  ? ?Home Medications  ?Prior to Admission  medications   ?Medication Sig Start Date End Date Taking? Authorizing Provider  ?albuterol (VENTOLIN HFA) 108 (90 Base) MCG/ACT inhaler Inhale 2 puffs into the lungs every 6 (six) hours as needed for wheezing or shortness of breath.    [provider]  ?blood glucose meter kit and supplies KIT Dispense based on patient and insurance preference. Use up to four times daily as directed. (FOR ICD-9 250.00, 250.01). 11/09/20   Florencia Reasons, MD  ?carbamazepine (TEGRETOL) 200 MG tablet Take 1 tablet (200 mg total) by mouth every 12 (twelve) hours. 08/15/21 09/14/21  Briant Cedar, MD  ?chlorproMAZINE (THORAZINE) 10 MG tablet Take 1 tablet (10 mg total) by mouth 2 (two) times daily. 08/15/21 09/14/21  Briant Cedar, MD  ?chlorproMAZINE (THORAZINE) 25 MG tablet Take 1 tablet (25 mg total) by mouth at bedtime. 08/15/21 09/14/21  Briant Cedar, MD  ?EPINEPHrine 0.3 mg/0.3 mL IJ SOAJ injection Inject 0.3 mg into the muscle once as needed for  anaphylaxis (severe allergic reaction). ?Patient not taking: Reported on 08/06/2021 05/02/20   [provider]  ?glucose blood (FREESTYLE TEST STRIPS) test strip Use as instructed 11/09/20   Florencia Reasons, MD  ?insulin aspart (NOVO

## 2021-09-30 NOTE — Progress Notes (Addendum)
eLink Physician-Brief Progress Note ?Patient Name: [name] [name]. ?DOB: [birth year] ?MRN: [id number] ? ? ?Date of Service ? 09/30/2021  ?HPI/Events of Note ? Brief new admit note: ?NP notes reviewed and agree for plan of care. ? 21 y.o. male, who presented to the [facility] ED with a chief complaint of AMS and vomiting ?  ?They have a pertinent past medical history of type 1 diabetes, anxiety, asthma, depression, seizure disorder, ODD, schizoaffective disorder, BPD 1 ?  ?Presented from home via EMS with altered mental status and vomiting.  ?  ?ED course was notable for VBG 6.84/20.8/64/3.5, glucose greater than 600.  On exam patient with altered mental status and kussmaul breathing ? ?Data: ?Reviewed ? ?Urine preg:  ?Camera; ? ?Discussed with RN. ?VS stable, encephalopathic. RR > 18.  ?Sats 100 ? ?A/P: ? ?DKA-encephalopathy. DM type 1. Pseudohyponatremia. Leukocytosis at 37 K. Alcohol < 10. Lipase normal. Elevated AST> ALT.  ?AKI.  EKG: sinus tach, no STEMI or NSTEMI. Qtc 454.non specific changes same as before. ?Severe AGMA-pH 6.8.  ?- stat bicarb 100 and to go on gtt, follow VBG at 6.30 AM.  ? ?- follow protocol. Follow labs. No obvious sepsis.  ?- get UA, No antibiotics for now. ?- urine drug tox ?- blood culture ordered.  ? ? ?Sz disorder. On Tegretol at home. ?- NPO for now. Resume once mentation better in AM.  Get a tegretol level.  ? ?HTN ?On acei. Resume once tablet to swallow meds in AM.  ? ?Asthma stable.   ?eICU Interventions ? -VTE: Lovenox ?-asp and sz precautions ?- follow RVP.  ? ?  ? ? ? ?Intervention Category ?Major Interventions: Hyperglycemia - active titration of insulin therapy ?Evaluation Type: New Patient Evaluation ? ?Ranee Gosselin ?09/30/2021, 4:29 AM ? ? ?

## 2021-09-30 NOTE — H&P (Signed)
? ?NAME:  [name] [name]., MRN:  [id number], DOB:  [birth year], LOS: 0 ?ADMISSION DATE:  09/30/2021, CONSULTATION DATE:  4/3 ?REFERRING MD:  Preston Fleeting, CHIEF COMPLAINT:  AMS  ? ?History of Present Illness:  ?Patient is encephalopathic. Therefore history has been obtained from chart review. No family at bedside ? ?[name] [name]., is a 21 y.o. male, who presented to the [facility] ED with a chief complaint of AMS and vomiting ? ?They have a pertinent past medical history of type 1 diabetes, anxiety, asthma, depression, seizure disorder, ODD, schizoaffective disorder, BPD 1 ? ?Presented from home via EMS with altered mental status and vomiting.  ? ?ED course was notable for VBG 6.84/20.8/64/3.5, glucose greater than 600.  On exam patient with altered mental status and kussmaul breathing. ? ?PCCM was consulted for for admission ? ?Pertinent  Medical History  ?type 1 diabetes, anxiety, asthma, depression, seizure disorder, ODD, schizoaffective disorder, BPD 1 ? ?Significant Hospital Events: ?Including procedures, antibiotic start and stop dates in addition to other pertinent events   ?4/3 presented to [facility], PCCM consult ? ?Interim History / Subjective:  ?See above ? ?Unable to obtain subjective evaluation due to patient status ? ? ?Objective   ?Blood pressure 108/60, pulse (!) 116, temperature 99.1 ?F (37.3 ?C), temperature source Axillary, resp. rate (!) 28, height 5\' 11"  (1.803 m), weight 81.2 kg, SpO2 99 %. ?   ?   ? ?Intake/Output Summary (Last 24 hours) at 09/30/2021 1244 ?Last data filed at 09/30/2021 1125 ?Gross per 24 hour  ?Intake 2690.01 ml  ?Output 3763 ml  ?Net -1072.99 ml  ? ?Filed Weights  ? 09/30/21 0231 09/30/21 0800  ?Weight: 81.2 kg 81.2 kg  ? ? ?Examination: ?General: In bed, unwell appearing,  ?HEENT: MM pink/dry, anicteric, atraumatic ?Neuro: MAE, RASS -2, kussmal respirations  ?CV: S1S2, ST, no m/r/g appreciated ?PULM:  clear in the upper lobes, clear in the lower lobes, trachea midline, chest expansion  symmetric ?GI: soft, bsx4 active, non-tender   ?Extremities: warm/dry, no pretibial edema, capillary refill less than 3 seconds  ?Skin:  no rashes or lesions noted ? ?Labs ?VBG 6.84/20.8/64/3.5 ?I-STAT: Sodium 126, potassium 5.2, hemoglobin 15.6 ?Ethanol: Within normal limits ?Lipase: Within normal meds  ?BHA pending ?Magnesium 3.4 ?CMP pending ?CBC pending ?Twelve-lead: Sinus tach, no significant ST changes noted. ? ?Resolved Hospital Problem list   ? ? ?Assessment & Plan:  ?Critically ill due to diabetic ketoacidosis with severe anion gap metabolic acidosis requiring titration of insulin infusion and fluid resuscitation ?Acute toxic metabolic encephalopathy secondary to hyperglycemia ?Severe volume contraction due to osmotic diuresis ?Persistent acute anion gap metabolic acidosis ?History of anxiety and depression ?Seizure disorder by history ? ?Plan: ? ?-Persistent severe hyperglycemia despite high-dose insulin infusion suggests inadequately suppressed counter regulatory hormones.  Patient appears severely volume contracted and is only received 1.5 L of fluid resuscitation. ?-Patient was given 2 L normal saline bolus with rapid improvement in hyperglycemia from 800s to 250. ?-Continue to manage DKA as per protocol.  Continue IV fluid resuscitation with glucose and potassium containing solutions until anion gap closed ?-Still protecting airway.  Metabolic encephalopathy improving with improving hyperglycemia.  Hold on psychiatry and for possible suicidality until sensorium clears. ? ?Best Practice (right click and "Reselect all SmartList Selections" daily)  ? ?Diet/type: NPO ?DVT prophylaxis: LMWH ?GI prophylaxis: N/A ?Lines: N/A ?Foley:  N/A ?Code Status:  full code ?Last date of multidisciplinary goals of care discussion [pending, called wife Rigdon Macomber at [phone], no answer] ? ?  CRITICAL CARE ?Performed by: Lynnell Catalan ? ? ?Total critical care time: 50 minutes ? ?Critical care time was exclusive of  separately billable procedures and treating other patients. ? ?Critical care was necessary to treat or prevent imminent or life-threatening deterioration. ? ?Critical care was time spent personally by me on the following activities: development of treatment plan with patient and/or surrogate as well as nursing, discussions with consultants, evaluation of patient's response to treatment, examination of patient, obtaining history from patient or surrogate, ordering and performing treatments and interventions, ordering and review of laboratory studies, ordering and review of radiographic studies, pulse oximetry, re-evaluation of patient's condition and participation in multidisciplinary rounds. ? ?Lynnell Catalan, MD FRCPC ?ICU Physician ?[facility]  ?Pager: [phone] ?Mobile: [phone] ?After hours: [phone]. ? ? ? ?

## 2021-10-01 DIAGNOSIS — E1010 Type 1 diabetes mellitus with ketoacidosis without coma: Secondary | ICD-10-CM

## 2021-10-01 LAB — BASIC METABOLIC PANEL
Anion gap: 4 — ABNORMAL LOW (ref 5–15)
BUN: 7 mg/dL (ref 6–20)
CO2: 25 mmol/L (ref 22–32)
Calcium: 7.9 mg/dL — ABNORMAL LOW (ref 8.9–10.3)
Chloride: 123 mmol/L — ABNORMAL HIGH (ref 98–111)
Creatinine, Ser: 0.79 mg/dL (ref 0.61–1.24)
GFR, Estimated: >60 mL/min (ref >60)
Glucose, Bld: 173 mg/dL — ABNORMAL HIGH (ref 70–99)
Potassium: 3.5 mmol/L (ref 3.5–5.1)
Sodium: 152 mmol/L — ABNORMAL HIGH (ref 135–145)

## 2021-10-01 LAB — GLUCOSE, CAPILLARY
Glucose-Capillary: 160 mg/dL — ABNORMAL HIGH (ref 70–99)
Glucose-Capillary: 147 mg/dL — ABNORMAL HIGH (ref 70–99)
Glucose-Capillary: 144 mg/dL — ABNORMAL HIGH (ref 70–99)
Glucose-Capillary: 133 mg/dL — ABNORMAL HIGH (ref 70–99)
Glucose-Capillary: 145 mg/dL — ABNORMAL HIGH (ref 70–99)
Glucose-Capillary: 145 mg/dL — ABNORMAL HIGH (ref 70–99)
Glucose-Capillary: 131 mg/dL — ABNORMAL HIGH (ref 70–99)

## 2021-10-01 MED ORDER — INSULIN ASPART 100 UNIT/ML IJ SOLN
4.0000 [IU] | Freq: Three times a day (TID) | INTRAMUSCULAR | Status: DC
  Administered 2021-10-01: 4 [IU] via SUBCUTANEOUS

## 2021-10-01 MED ORDER — KCL-LACTATED RINGERS 20 MEQ/L IV SOLN
INTRAVENOUS | Status: DC
  Filled 2021-10-01: qty 1000

## 2021-10-01 MED ORDER — HALOPERIDOL 5 MG PO TABS
5.0000 mg | ORAL_TABLET | Freq: Two times a day (BID) | ORAL | Status: DC
  Administered 2021-10-01: 5 mg via ORAL
  Filled 2021-10-01 (×2): qty 1

## 2021-10-01 MED ORDER — INSULIN ASPART 100 UNIT/ML IJ SOLN
0.0000 [IU] | Freq: Three times a day (TID) | INTRAMUSCULAR | Status: DC
  Administered 2021-10-01: 3 [IU] via SUBCUTANEOUS

## 2021-10-01 MED ORDER — ZIPRASIDONE MESYLATE 20 MG IM SOLR: 20.0000 mg | Freq: Two times a day (BID) | INTRAMUSCULAR | Status: DC | PRN

## 2021-10-01 MED ORDER — INSULIN GLARGINE-YFGN 100 UNIT/ML ~~LOC~~ SOLN
30.0000 [IU] | Freq: Two times a day (BID) | SUBCUTANEOUS | Status: DC
  Administered 2021-10-01: 30 [IU] via SUBCUTANEOUS
  Filled 2021-10-01 (×2): qty 0.3

## 2021-10-01 MED ORDER — INSULIN ASPART 100 UNIT/ML IJ SOLN: 0.0000 [IU] | Freq: Every day | INTRAMUSCULAR | Status: DC

## 2021-10-01 MED ORDER — LORAZEPAM 2 MG/ML IJ SOLN: 2.0000 mg | INTRAMUSCULAR | Status: DC | PRN

## 2021-10-01 MED ORDER — LORAZEPAM 1 MG PO TABS: 1.0000 mg | ORAL_TABLET | ORAL | Status: DC | PRN

## 2021-10-01 MED ORDER — POTASSIUM CHLORIDE 2 MEQ/ML IV SOLN
INTRAVENOUS | Status: DC
  Filled 2021-10-01: qty 1000
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
## 2021-10-01 NOTE — Discharge Summary (Signed)
Physician Discharge Summary  ? ?  ? ? ? ? ?Patient ID: ?[name] [name]. ?MRN: [id number] ?DOB/AGE: [birth year] 23 y.o. ? ?Admit date: 09/30/2021 ?Discharge date: 10/01/2021 ? ?Discharge Diagnoses:   ? ?Active Hospital Problems  ? Diagnosis Date Noted  ? DKA (diabetic ketoacidosis) ([place]) 11/10/2020  ? Acute metabolic encephalopathy   ?  ?Resolved Hospital Problems  ?No resolved problems to display.  ?  ? ? ?Discharge summary   ?[name] Jurgens., is a 21 y.o. male, who presented to the [facility] ED with a chief complaint of AMS and vomiting ?  ?They have a pertinent past medical history of type 1 diabetes, anxiety, asthma, depression, seizure disorder, ODD, schizoaffective disorder, BPD 1 ?  ?Presented from home via EMS with altered mental status and vomiting.  ?  ?ED course was notable for VBG 6.84/20.8/64/3.5, glucose greater than 600.  On exam patient with altered mental status and kussmaul breathing. ?  ?PCCM was consulted for for admission ? ? ?Discharge Plan by Active Problems   ? ? ?Type 1 DM with DKA ?Pt left AMA ?Continue home insulin, pt reports recent infection and this may have contributed to developing DKA.  Encouraged to check glucose regularly and follow up closely with PCPand return for concerns ? ? ?Acute Metabolic Encephalopathy ?Resolved ? ? ?Anxiety, depression ?Aggressive towards staff, but denies SI/HI ?Continue home medications ? ? ? ? ?Significant Hospital tests/ studies  ? ? ?CXR ?1. Left central line tip: Cavoatrial junction. No pneumothorax or ?complicating feature. ?2. Low lung volumes. ? ?Procedures   ?L subclavian CVC ? ?Culture data/antimicrobials   ? ?  ?Consults  ? ?  ? ?Discharge Exam: ?BP 108/61 (BP Location: Right Arm)   Pulse (!) 102   Temp (!) 100.5 ?F (38.1 ?C) (Axillary)   Resp (!) 23   Ht '5\' 11"'  (1.803 m)   Wt 81.2 kg   SpO2 96%   BMI 24.97 kg/m?  ? ?General:  calm and in no distress ?HEENT: MM pink/moist ?Neuro: alert, oriented x3 following commands ?CV: regular ?PULM:  no  distress on RA ?Extremities: warm/dry, no edema  ?Skin: no rashes or lesions ? ?Labs at discharge  ? ?Lab Results  ?Component Value Date  ? CREATININE 0.79 10/01/2021  ? BUN 7 10/01/2021  ? NA 152 (H) 10/01/2021  ? K 3.5 10/01/2021  ? CL 123 (H) 10/01/2021  ? CO2 25 10/01/2021  ? ?Lab Results  ?Component Value Date  ? WBC 37.2 (H) 09/30/2021  ? HGB 11.9 (L) 09/30/2021  ? HCT 35.0 (L) 09/30/2021  ? MCV 94.4 09/30/2021  ? PLT 429 (H) 09/30/2021  ? ?Lab Results  ?Component Value Date  ? ALT 234 (H) 09/30/2021  ? AST 240 (H) 09/30/2021  ? ALKPHOS 194 (H) 09/30/2021  ? BILITOT 2.0 (H) 09/30/2021  ? ?No results found for: INR, PROTIME ? ?Current radiological studies   ? ?DG CHEST PORT 1 VIEW ? ?Result Date: 09/30/2021 ?CLINICAL DATA:  Central line placement, shortness of breath. Diabetic ketoacidosis with blood could close level of 941 this morning. EXAM: PORTABLE CHEST 1 VIEW COMPARISON:  09/30/2021 exam at 5:28 a.m. FINDINGS: Low lung volumes are present, causing crowding of the pulmonary vasculature. A left central line is noted with its tip at the cavoatrial junction. Cardiac and mediastinal margins appear normal. No blunting of the costophrenic angles. No substantial bony abnormality. IMPRESSION: 1. Left central line tip: Cavoatrial junction. No pneumothorax or complicating feature. 2. Low lung volumes. Electronically  Signed   By: Van Clines M.D.   On: 09/30/2021 09:48  ? ?DG CHEST PORT 1 VIEW ? ?Result Date: 09/30/2021 ?CLINICAL DATA:  21 year old male with history of shortness of breath. EXAM: PORTABLE CHEST 1 VIEW COMPARISON:  Chest x-ray 03/13/2021. FINDINGS: Lung volumes are low. No consolidative airspace disease. No pleural effusions. No pneumothorax. No pulmonary nodule or mass noted. Pulmonary vasculature and the cardiomediastinal silhouette are within normal limits. IMPRESSION: 1. Low lung volumes without radiographic evidence of acute cardiopulmonary disease. Electronically Signed   By: [name]  M.D.   On: 09/30/2021 06:23   ? ?Disposition:  HOME AMA  ? ?  ? ? ? ?Allergies as of 10/01/2021   ? ?   Reactions  ? Bee Venom Anaphylaxis  ? Fish Allergy Anaphylaxis  ? Shellfish Allergy Anaphylaxis  ? Tea Anaphylaxis  ? ?  ? ?  ?Medication List  ?  ? ?TAKE these medications   ? ?Advocate Insulin Pen Needles 29G X 12.7MM Misc ?Generic drug: Insulin Pen Needle ?For type I diabetes ?  ?albuterol 108 (90 Base) MCG/ACT inhaler ?Commonly known as: VENTOLIN HFA ?Inhale 2 puffs into the lungs every 6 (six) hours as needed for wheezing or shortness of breath. ?  ?ARIPiprazole 5 MG tablet ?Commonly known as: ABILIFY ?Take 5 mg by mouth daily. ?  ?blood glucose meter kit and supplies Kit ?Dispense based on patient and insurance preference. Use up to four times daily as directed. (FOR ICD-9 250.00, 250.01). ?  ?carbamazepine 200 MG tablet ?Commonly known as: TEGRETOL ?Take 1 tablet (200 mg total) by mouth every 12 (twelve) hours. ?  ?chlorproMAZINE 25 MG tablet ?Commonly known as: THORAZINE ?Take 1 tablet (25 mg total) by mouth at bedtime. ?  ?chlorproMAZINE 10 MG tablet ?Commonly known as: THORAZINE ?Take 1 tablet (10 mg total) by mouth 2 (two) times daily. ?  ?EPINEPHrine 0.3 mg/0.3 mL Soaj injection ?Commonly known as: EPI-PEN ?Inject 0.3 mg into the muscle once as needed for anaphylaxis (severe allergic reaction). ?  ?FREESTYLE TEST STRIPS test strip ?Generic drug: glucose blood ?Use as instructed ?  ?insulin glargine 100 UNIT/ML Solostar Pen ?Commonly known as: LANTUS ?Inject 30 Units into the skin 2 (two) times daily. ?What changed:  ?how much to take ?when to take this ?  ?lisinopril 5 MG tablet ?Commonly known as: ZESTRIL ?Take 1 tablet (5 mg total) by mouth daily. ?  ?metroNIDAZOLE 500 MG tablet ?Commonly known as: FLAGYL ?Take 500 mg by mouth 2 (two) times daily. For 10 days ?  ?nicotine 14 mg/24hr patch ?Commonly known as: NICODERM CQ - dosed in mg/24 hours ?Place 1 patch (14 mg total) onto the skin daily. ?   ?NovoLOG FlexPen 100 UNIT/ML FlexPen ?Generic drug: insulin aspart ?Inject 1-15 Units into the skin 3 (three) times daily with meals. 1 unit/g of carbohydrate sliding scale. ?What changed:  ?how much to take ?additional instructions ?  ?ondansetron 4 MG tablet ?Commonly known as: ZOFRAN ?Take 4 mg by mouth every 8 (eight) hours as needed for nausea/vomiting. ?  ?pantoprazole 40 MG tablet ?Commonly known as: PROTONIX ?Take 40 mg by mouth 2 (two) times daily. ?  ?QUEtiapine 50 MG tablet ?Commonly known as: SEROQUEL ?Take 50 mg by mouth at bedtime. ?  ?sertraline 50 MG tablet ?Commonly known as: ZOLOFT ?Take 50 mg by mouth daily. ?  ? ?  ? ? ?Follow-up appointment   ?PCP ? ?Discharge Condition:   ? good ? ? ? ?Signed: ?Otilio Carpen  Coley Kulikowski ?10/01/2021, 3:37 PM ? ? ?

## 2021-10-01 NOTE — Progress Notes (Signed)
eLink Physician-Brief Progress Note ?Patient Name: [name] [name]. ?DOB: 06/15/2001 ?MRN: [id number] ? ? ?Date of Service ? 10/01/2021  ?HPI/Events of Note ? Nursing request for BMP.  ?eICU Interventions ? Will order BMP.   ? ? ? ?  ? ?Nateisha Moyd Dennard Nip ?10/01/2021, 1:23 AM ?
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
## 2021-10-01 NOTE — Progress Notes (Signed)
Patient requests to leave AMA. CCM PA and MD aware. ? ?

## 2021-10-01 NOTE — Significant Event (Signed)
Pt requesting to sign out AMA, he is alert and oriented and denies thoughts of harming himself or others.  I discussed the risks of leaving the hospital against medical advice, pt acknowledges understanding these risks. Dr. Denese Killings aware.  ? ?Darcella Gasman Meena Barrantes, PA-C ?[facility] ?See Amion for pager ?If no response to pager , please call [phone] until 7pm ?After 7:00 pm call Elink  [phone] ? ?

## 2021-10-01 NOTE — Progress Notes (Signed)
eLink Physician-Brief Progress Note ?Patient Name: [name] [name]. ?DOB: 2001/06/10 ?MRN: [id number] ? ? ?Date of Service ? 10/01/2021  ?HPI/Events of Note ? Oliguria - Bladder scan with > 999 mL residual.   ?eICU Interventions ? Plan: ?I/O cath PRN.   ? ? ? ?Intervention Category ?Major Interventions: Other: ? ?Wasil Wolke Cornelia Copa ?10/01/2021, 6:00 AM ?

## 2021-10-05 LAB — CULTURE, BLOOD (ROUTINE X 2)
Culture: NO GROWTH
Culture: NO GROWTH
Special Requests: ADEQUATE
Special Requests: ADEQUATE

## 2021-11-04 ENCOUNTER — Inpatient Hospital Stay (HOSPITAL_COMMUNITY)
Admission: EM | Admit: 2021-11-04 | Discharge: 2021-11-08 | DRG: 638 | Disposition: A | Discharge status: Home / Self Care | Payer: Medicaid Other | Attending: Family Medicine | Admitting: Family Medicine

## 2021-11-04 ENCOUNTER — Emergency Department (HOSPITAL_COMMUNITY): Payer: Medicaid Other

## 2021-11-04 ENCOUNTER — Other Ambulatory Visit: Payer: Self-pay

## 2021-11-04 ENCOUNTER — Encounter (HOSPITAL_COMMUNITY): Payer: Self-pay | Admitting: Emergency Medicine

## 2021-11-04 DIAGNOSIS — E1010 Type 1 diabetes mellitus with ketoacidosis without coma: Secondary | ICD-10-CM | POA: Diagnosis not present

## 2021-11-04 DIAGNOSIS — K5900 Constipation, unspecified: Secondary | ICD-10-CM | POA: Diagnosis present

## 2021-11-04 DIAGNOSIS — R1013 Epigastric pain: Secondary | ICD-10-CM

## 2021-11-04 DIAGNOSIS — E860 Dehydration: Secondary | ICD-10-CM | POA: Diagnosis present

## 2021-11-04 DIAGNOSIS — F988 Other specified behavioral and emotional disorders with onset usually occurring in childhood and adolescence: Secondary | ICD-10-CM

## 2021-11-04 DIAGNOSIS — F319 Bipolar disorder, unspecified: Secondary | ICD-10-CM | POA: Diagnosis present

## 2021-11-04 DIAGNOSIS — Z91018 Allergy to other foods: Secondary | ICD-10-CM

## 2021-11-04 DIAGNOSIS — R339 Retention of urine, unspecified: Secondary | ICD-10-CM | POA: Diagnosis present

## 2021-11-04 DIAGNOSIS — F909 Attention-deficit hyperactivity disorder, unspecified type: Secondary | ICD-10-CM | POA: Diagnosis present

## 2021-11-04 DIAGNOSIS — E8729 Other acidosis: Secondary | ICD-10-CM

## 2021-11-04 DIAGNOSIS — Z8349 Family history of other endocrine, nutritional and metabolic diseases: Secondary | ICD-10-CM

## 2021-11-04 DIAGNOSIS — R748 Abnormal levels of other serum enzymes: Secondary | ICD-10-CM

## 2021-11-04 DIAGNOSIS — N179 Acute kidney failure, unspecified: Secondary | ICD-10-CM

## 2021-11-04 DIAGNOSIS — F1510 Other stimulant abuse, uncomplicated: Secondary | ICD-10-CM

## 2021-11-04 DIAGNOSIS — D573 Sickle-cell trait: Secondary | ICD-10-CM | POA: Diagnosis present

## 2021-11-04 DIAGNOSIS — E109 Type 1 diabetes mellitus without complications: Secondary | ICD-10-CM

## 2021-11-04 DIAGNOSIS — Z825 Family history of asthma and other chronic lower respiratory diseases: Secondary | ICD-10-CM

## 2021-11-04 DIAGNOSIS — Z91148 Patient's other noncompliance with medication regimen for other reason: Secondary | ICD-10-CM

## 2021-11-04 DIAGNOSIS — B1920 Unspecified viral hepatitis C without hepatic coma: Secondary | ICD-10-CM | POA: Diagnosis present

## 2021-11-04 DIAGNOSIS — Z91030 Bee allergy status: Secondary | ICD-10-CM

## 2021-11-04 DIAGNOSIS — Z79899 Other long term (current) drug therapy: Secondary | ICD-10-CM

## 2021-11-04 DIAGNOSIS — F913 Oppositional defiant disorder: Secondary | ICD-10-CM | POA: Diagnosis present

## 2021-11-04 DIAGNOSIS — F259 Schizoaffective disorder, unspecified: Secondary | ICD-10-CM | POA: Diagnosis present

## 2021-11-04 DIAGNOSIS — F1110 Opioid abuse, uncomplicated: Secondary | ICD-10-CM | POA: Diagnosis present

## 2021-11-04 DIAGNOSIS — E1042 Type 1 diabetes mellitus with diabetic polyneuropathy: Secondary | ICD-10-CM | POA: Diagnosis present

## 2021-11-04 DIAGNOSIS — R079 Chest pain, unspecified: Secondary | ICD-10-CM

## 2021-11-04 DIAGNOSIS — J45909 Unspecified asthma, uncomplicated: Secondary | ICD-10-CM | POA: Diagnosis present

## 2021-11-04 DIAGNOSIS — Z833 Family history of diabetes mellitus: Secondary | ICD-10-CM

## 2021-11-04 DIAGNOSIS — Z794 Long term (current) use of insulin: Secondary | ICD-10-CM

## 2021-11-04 DIAGNOSIS — F603 Borderline personality disorder: Secondary | ICD-10-CM | POA: Diagnosis present

## 2021-11-04 DIAGNOSIS — Z5901 Sheltered homelessness: Secondary | ICD-10-CM

## 2021-11-04 DIAGNOSIS — Z91013 Allergy to seafood: Secondary | ICD-10-CM

## 2021-11-04 DIAGNOSIS — F1520 Other stimulant dependence, uncomplicated: Secondary | ICD-10-CM | POA: Diagnosis present

## 2021-11-04 DIAGNOSIS — G40909 Epilepsy, unspecified, not intractable, without status epilepticus: Secondary | ICD-10-CM

## 2021-11-04 DIAGNOSIS — F17210 Nicotine dependence, cigarettes, uncomplicated: Secondary | ICD-10-CM | POA: Diagnosis present

## 2021-11-04 DIAGNOSIS — Z8249 Family history of ischemic heart disease and other diseases of the circulatory system: Secondary | ICD-10-CM

## 2021-11-04 LAB — HEPATIC FUNCTION PANEL
ALT: 249 U/L — ABNORMAL HIGH (ref 0–44)
AST: 250 U/L — ABNORMAL HIGH (ref 15–41)
Albumin: 3.8 g/dL (ref 3.5–5.0)
Alkaline Phosphatase: 153 U/L — ABNORMAL HIGH (ref 38–126)
Bilirubin, Direct: 0.1 mg/dL (ref 0.0–0.2)
Indirect Bilirubin: 3.1 mg/dL — ABNORMAL HIGH (ref 0.3–0.9)
Total Bilirubin: 3.2 mg/dL — ABNORMAL HIGH (ref 0.3–1.2)
Total Protein: 7.6 g/dL (ref 6.5–8.1)

## 2021-11-04 LAB — BASIC METABOLIC PANEL
Anion gap: 30 — ABNORMAL HIGH (ref 5–15)
Anion gap: 23 — ABNORMAL HIGH (ref 5–15)
BUN: 19 mg/dL (ref 6–20)
BUN: 15 mg/dL (ref 6–20)
CO2: 9 mmol/L — ABNORMAL LOW (ref 22–32)
CO2: 9 mmol/L — ABNORMAL LOW (ref 22–32)
Calcium: 8.9 mg/dL (ref 8.9–10.3)
Calcium: 8.2 mg/dL — ABNORMAL LOW (ref 8.9–10.3)
Chloride: 95 mmol/L — ABNORMAL LOW (ref 98–111)
Chloride: 109 mmol/L (ref 98–111)
Creatinine, Ser: 1.3 mg/dL — ABNORMAL HIGH (ref 0.61–1.24)
Creatinine, Ser: 1.25 mg/dL — ABNORMAL HIGH (ref 0.61–1.24)
GFR, Estimated: >60 mL/min (ref >60)
GFR, Estimated: >60 mL/min (ref >60)
Glucose, Bld: 621 mg/dL (ref 70–99)
Glucose, Bld: 243 mg/dL — ABNORMAL HIGH (ref 70–99)
Potassium: 4.6 mmol/L (ref 3.5–5.1)
Potassium: 4.7 mmol/L (ref 3.5–5.1)
Sodium: 134 mmol/L — ABNORMAL LOW (ref 135–145)
Sodium: 141 mmol/L (ref 135–145)

## 2021-11-04 LAB — RAPID URINE DRUG SCREEN, HOSP PERFORMED
Amphetamines: NOT DETECTED
Barbiturates: NOT DETECTED
Benzodiazepines: NOT DETECTED
Cocaine: NOT DETECTED
Opiates: NOT DETECTED
Tetrahydrocannabinol: NOT DETECTED

## 2021-11-04 LAB — BETA-HYDROXYBUTYRIC ACID
Beta-Hydroxybutyric Acid: >8 mmol/L — ABNORMAL HIGH (ref 0.05–0.27)
Beta-Hydroxybutyric Acid: 6.79 mmol/L — ABNORMAL HIGH (ref 0.05–0.27)

## 2021-11-04 LAB — URINALYSIS, ROUTINE W REFLEX MICROSCOPIC
Bacteria, UA: NONE SEEN
Bilirubin Urine: NEGATIVE
Glucose, UA: >=500 mg/dL — AB
Hgb urine dipstick: NEGATIVE
Ketones, ur: 80 mg/dL — AB
Leukocytes,Ua: NEGATIVE
Nitrite: NEGATIVE
Protein, ur: NEGATIVE mg/dL
Specific Gravity, Urine: 1.018 (ref 1.005–1.030)
pH: 5 (ref 5.0–8.0)

## 2021-11-04 LAB — CBC
HCT: 44.7 % (ref 39.0–52.0)
Hemoglobin: 15.2 g/dL (ref 13.0–17.0)
MCH: 29.3 pg (ref 26.0–34.0)
MCHC: 34 g/dL (ref 30.0–36.0)
MCV: 86.3 fL (ref 80.0–100.0)
Platelets: 354 10*3/uL (ref 150–400)
RBC: 5.18 MIL/uL (ref 4.22–5.81)
RDW: 13 % (ref 11.5–15.5)
WBC: 9.4 10*3/uL (ref 4.0–10.5)
nRBC: 0 % (ref 0.0–0.2)

## 2021-11-04 LAB — ETHANOL: Alcohol, Ethyl (B): <10 mg/dL (ref <10)

## 2021-11-04 LAB — BLOOD GAS, VENOUS
Acid-base deficit: 16.3 mmol/L — ABNORMAL HIGH (ref 0.0–2.0)
Bicarbonate: 8.5 mmol/L — ABNORMAL LOW (ref 20.0–28.0)
O2 Saturation: 95.5 %
Patient temperature: 37
pCO2, Ven: 19 mmHg — CL (ref 44–60)
pH, Ven: 7.26 (ref 7.25–7.43)
pO2, Ven: 73 mmHg — ABNORMAL HIGH (ref 32–45)

## 2021-11-04 LAB — HIV ANTIBODY (ROUTINE TESTING W REFLEX): HIV Screen 4th Generation wRfx: NONREACTIVE

## 2021-11-04 LAB — GLUCOSE, CAPILLARY
Glucose-Capillary: 476 mg/dL — ABNORMAL HIGH (ref 70–99)
Glucose-Capillary: 593 mg/dL (ref 70–99)
Glucose-Capillary: 381 mg/dL — ABNORMAL HIGH (ref 70–99)
Glucose-Capillary: 277 mg/dL — ABNORMAL HIGH (ref 70–99)
Glucose-Capillary: 194 mg/dL — ABNORMAL HIGH (ref 70–99)
Glucose-Capillary: 194 mg/dL — ABNORMAL HIGH (ref 70–99)
Glucose-Capillary: 197 mg/dL — ABNORMAL HIGH (ref 70–99)

## 2021-11-04 LAB — CBG MONITORING, ED
Glucose-Capillary: >600 mg/dL (ref 70–99)
Glucose-Capillary: >600 mg/dL (ref 70–99)

## 2021-11-04 LAB — LIPASE, BLOOD: Lipase: 31 U/L (ref 11–51)

## 2021-11-04 LAB — MRSA NEXT GEN BY PCR, NASAL: MRSA by PCR Next Gen: NOT DETECTED

## 2021-11-04 LAB — OSMOLALITY: Osmolality: 330 mOsm/kg (ref 275–295)

## 2021-11-04 LAB — TROPONIN I (HIGH SENSITIVITY)
Troponin I (High Sensitivity): 3 ng/L (ref <18)
Troponin I (High Sensitivity): 2 ng/L (ref <18)

## 2021-11-04 MED ORDER — PANTOPRAZOLE SODIUM 40 MG IV SOLR: 40.0000 mg | Freq: Two times a day (BID) | INTRAVENOUS | Status: DC

## 2021-11-04 MED ORDER — CHLORHEXIDINE GLUCONATE CLOTH 2 % EX PADS
6.0000 | MEDICATED_PAD | Freq: Every day | CUTANEOUS | Status: DC
  Administered 2021-11-04 – 2021-11-07 (×4): 6 via TOPICAL

## 2021-11-04 MED ORDER — HYDROMORPHONE HCL 1 MG/ML IJ SOLN
0.5000 mg | INTRAMUSCULAR | Status: DC | PRN
  Administered 2021-11-04 – 2021-11-07 (×15): 0.5 mg via INTRAVENOUS
  Filled 2021-11-04 (×8): qty 1
  Filled 2021-11-04: qty 0.5
  Filled 2021-11-04 (×2): qty 1
  Filled 2021-11-04: qty 0.5
  Filled 2021-11-04 (×2): qty 1
  Filled 2021-11-04: qty 0.5

## 2021-11-04 MED ORDER — PANTOPRAZOLE SODIUM 40 MG IV SOLR
40.0000 mg | Freq: Two times a day (BID) | INTRAVENOUS | Status: DC
  Administered 2021-11-04 – 2021-11-06 (×5): 40 mg via INTRAVENOUS
  Filled 2021-11-04 (×5): qty 10

## 2021-11-04 MED ORDER — LACTATED RINGERS IV BOLUS
1000.0000 mL | Freq: Once | INTRAVENOUS | Status: AC
Start: 2021-11-04 — End: 2021-11-04
  Administered 2021-11-04: 1000 mL via INTRAVENOUS

## 2021-11-04 MED ORDER — DEXTROSE 50 % IV SOLN: 0.0000 mL | INTRAVENOUS | Status: DC | PRN

## 2021-11-04 MED ORDER — SERTRALINE HCL 50 MG PO TABS
50.0000 mg | ORAL_TABLET | Freq: Every day | ORAL | Status: DC
  Administered 2021-11-05 – 2021-11-08 (×4): 50 mg via ORAL
  Filled 2021-11-04 (×4): qty 1

## 2021-11-04 MED ORDER — LACTATED RINGERS IV BOLUS
1600.0000 mL | Freq: Once | INTRAVENOUS | Status: AC
  Administered 2021-11-04: 1600 mL via INTRAVENOUS

## 2021-11-04 MED ORDER — LACTATED RINGERS IV SOLN: INTRAVENOUS | Status: DC

## 2021-11-04 MED ORDER — INSULIN REGULAR(HUMAN) IN NACL 100-0.9 UT/100ML-% IV SOLN
INTRAVENOUS | Status: DC
  Administered 2021-11-04: 10 [IU]/h via INTRAVENOUS
  Filled 2021-11-04: qty 100

## 2021-11-04 MED ORDER — DEXTROSE IN LACTATED RINGERS 5 % IV SOLN: INTRAVENOUS | Status: DC

## 2021-11-04 MED ORDER — MORPHINE SULFATE (PF) 4 MG/ML IV SOLN
4.0000 mg | Freq: Once | INTRAVENOUS | Status: AC
  Administered 2021-11-04: 4 mg via INTRAVENOUS
  Filled 2021-11-04: qty 1

## 2021-11-04 MED ORDER — ALBUTEROL SULFATE (2.5 MG/3ML) 0.083% IN NEBU: 2.5000 mg | INHALATION_SOLUTION | Freq: Four times a day (QID) | RESPIRATORY_TRACT | Status: DC | PRN

## 2021-11-04 MED ORDER — INSULIN REGULAR(HUMAN) IN NACL 100-0.9 UT/100ML-% IV SOLN
INTRAVENOUS | Status: DC
  Administered 2021-11-04: 3.6 [IU]/h via INTRAVENOUS
  Administered 2021-11-05: 5.5 [IU]/h via INTRAVENOUS
  Filled 2021-11-04: qty 100

## 2021-11-04 MED ORDER — QUETIAPINE FUMARATE 50 MG PO TABS: 50.0000 mg | ORAL_TABLET | Freq: Every day | ORAL | Status: DC

## 2021-11-04 MED ORDER — ENOXAPARIN SODIUM 40 MG/0.4ML IJ SOSY
40.0000 mg | PREFILLED_SYRINGE | INTRAMUSCULAR | Status: DC
  Administered 2021-11-04 – 2021-11-07 (×3): 40 mg via SUBCUTANEOUS
  Filled 2021-11-04 (×4): qty 0.4

## 2021-11-04 MED ORDER — POTASSIUM CHLORIDE 10 MEQ/100ML IV SOLN
10.0000 meq | INTRAVENOUS | Status: AC
  Administered 2021-11-04 (×2): 10 meq via INTRAVENOUS
  Filled 2021-11-04 (×2): qty 100

## 2021-11-04 MED ORDER — ARIPIPRAZOLE 5 MG PO TABS: 5.0000 mg | ORAL_TABLET | Freq: Every day | ORAL | Status: DC

## 2021-11-04 MED ORDER — HYDROMORPHONE HCL 1 MG/ML IJ SOLN
1.0000 mg | Freq: Once | INTRAMUSCULAR | Status: AC
  Administered 2021-11-04: 1 mg via INTRAVENOUS
  Filled 2021-11-04: qty 1

## 2021-11-04 MED ORDER — ORAL CARE MOUTH RINSE
15.0000 mL | Freq: Two times a day (BID) | OROMUCOSAL | Status: DC
  Administered 2021-11-05 – 2021-11-07 (×5): 15 mL via OROMUCOSAL

## 2021-11-04 MED ORDER — ONDANSETRON HCL 4 MG/2ML IJ SOLN
4.0000 mg | Freq: Four times a day (QID) | INTRAMUSCULAR | Status: DC | PRN
  Filled 2021-11-04: qty 2

## 2021-11-04 MED ORDER — CARBAMAZEPINE 200 MG PO TABS
200.0000 mg | ORAL_TABLET | Freq: Two times a day (BID) | ORAL | Status: DC
  Filled 2021-11-04: qty 1

## 2021-11-04 MED ORDER — ALUM & MAG HYDROXIDE-SIMETH 200-200-20 MG/5ML PO SUSP
30.0000 mL | Freq: Four times a day (QID) | ORAL | Status: DC | PRN
  Administered 2021-11-04: 30 mL via ORAL
  Filled 2021-11-04: qty 30

## 2021-11-04 MED ORDER — ALBUTEROL SULFATE HFA 108 (90 BASE) MCG/ACT IN AERS: 2.0000 | INHALATION_SPRAY | Freq: Four times a day (QID) | RESPIRATORY_TRACT | Status: DC | PRN

## 2021-11-04 MED ORDER — SODIUM CHLORIDE 0.9 % IV BOLUS
500.0000 mL | Freq: Once | INTRAVENOUS | Status: AC
  Administered 2021-11-04: 500 mL via INTRAVENOUS

## 2021-11-04 MED ORDER — ONDANSETRON HCL 4 MG/2ML IJ SOLN
4.0000 mg | Freq: Once | INTRAMUSCULAR | Status: AC
  Administered 2021-11-04: 4 mg via INTRAVENOUS
  Filled 2021-11-04: qty 2

## 2021-11-04 NOTE — Progress Notes (Signed)
Inpatient Diabetes Program Recommendations ? ?AACE/ADA: New Consensus Statement on Inpatient Glycemic Control (2015) ? ?Target Ranges:  Prepandial:   less than 140 mg/dL ?     Peak postprandial:   less than 180 mg/dL (1-2 hours) ?     Critically ill patients:  140 - 180 mg/dL  ? ?Lab Results  ?Component Value Date  ? GLUCAP >600 (HH) 11/04/2021  ? HGBA1C 8.8 (H) 08/13/2021  ? ? ?Review of Glycemic Control ? Latest Reference Range & Units 11/04/21 14:14  ?Glucose-Capillary 70 - 99 mg/dL >643 (HH)  ? ?Diabetes history: DM 1 ?Outpatient Diabetes medications:  ?Novolog 0-14 units tid with meals ?Lantus 30 units bid ?Current orders for Inpatient glycemic control:  ?IV insulin/ ?Inpatient Diabetes Program Recommendations:   ?Note patient states he does not have insulin and therefore is likely in DKA. Will follow.  ? ?Thanks,  ?Beryl Meager, RN, BC-ADM ?Inpatient Diabetes Coordinator ?Pager [phone]  (8a-5p) ? ? ?
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
## 2021-11-04 NOTE — Progress Notes (Signed)
Date and time results received: 11/04/21 1800 ?(use smartphrase ".now" to insert current time) ? ?Test: Serum Osmolality  ?Critical Value: 330 ? ?Name of Provider Notified: Dr. Sunnie Nielsen  ? ? ?
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
## 2021-11-05 ENCOUNTER — Inpatient Hospital Stay (HOSPITAL_COMMUNITY): Payer: Medicaid Other

## 2021-11-05 DIAGNOSIS — B1920 Unspecified viral hepatitis C without hepatic coma: Secondary | ICD-10-CM | POA: Diagnosis present

## 2021-11-05 DIAGNOSIS — F339 Major depressive disorder, recurrent, unspecified: Secondary | ICD-10-CM | POA: Diagnosis not present

## 2021-11-05 DIAGNOSIS — E8729 Other acidosis: Secondary | ICD-10-CM | POA: Diagnosis not present

## 2021-11-05 DIAGNOSIS — Z794 Long term (current) use of insulin: Secondary | ICD-10-CM | POA: Diagnosis not present

## 2021-11-05 DIAGNOSIS — Z79899 Other long term (current) drug therapy: Secondary | ICD-10-CM | POA: Diagnosis not present

## 2021-11-05 DIAGNOSIS — Z5901 Sheltered homelessness: Secondary | ICD-10-CM | POA: Diagnosis not present

## 2021-11-05 DIAGNOSIS — K5900 Constipation, unspecified: Secondary | ICD-10-CM | POA: Diagnosis present

## 2021-11-05 DIAGNOSIS — E1010 Type 1 diabetes mellitus with ketoacidosis without coma: Principal | ICD-10-CM

## 2021-11-05 DIAGNOSIS — F1520 Other stimulant dependence, uncomplicated: Secondary | ICD-10-CM | POA: Diagnosis present

## 2021-11-05 DIAGNOSIS — Z825 Family history of asthma and other chronic lower respiratory diseases: Secondary | ICD-10-CM | POA: Diagnosis not present

## 2021-11-05 DIAGNOSIS — Z91148 Patient's other noncompliance with medication regimen for other reason: Secondary | ICD-10-CM | POA: Diagnosis not present

## 2021-11-05 DIAGNOSIS — J45909 Unspecified asthma, uncomplicated: Secondary | ICD-10-CM | POA: Diagnosis present

## 2021-11-05 DIAGNOSIS — N179 Acute kidney failure, unspecified: Secondary | ICD-10-CM | POA: Diagnosis present

## 2021-11-05 DIAGNOSIS — F17210 Nicotine dependence, cigarettes, uncomplicated: Secondary | ICD-10-CM | POA: Diagnosis present

## 2021-11-05 DIAGNOSIS — F1110 Opioid abuse, uncomplicated: Secondary | ICD-10-CM | POA: Diagnosis present

## 2021-11-05 DIAGNOSIS — F319 Bipolar disorder, unspecified: Secondary | ICD-10-CM | POA: Diagnosis present

## 2021-11-05 DIAGNOSIS — E1042 Type 1 diabetes mellitus with diabetic polyneuropathy: Secondary | ICD-10-CM | POA: Diagnosis present

## 2021-11-05 DIAGNOSIS — F913 Oppositional defiant disorder: Secondary | ICD-10-CM | POA: Diagnosis present

## 2021-11-05 DIAGNOSIS — D573 Sickle-cell trait: Secondary | ICD-10-CM | POA: Diagnosis present

## 2021-11-05 DIAGNOSIS — F259 Schizoaffective disorder, unspecified: Secondary | ICD-10-CM | POA: Diagnosis present

## 2021-11-05 DIAGNOSIS — Z91030 Bee allergy status: Secondary | ICD-10-CM | POA: Diagnosis not present

## 2021-11-05 DIAGNOSIS — R1013 Epigastric pain: Secondary | ICD-10-CM | POA: Diagnosis present

## 2021-11-05 DIAGNOSIS — F1510 Other stimulant abuse, uncomplicated: Secondary | ICD-10-CM | POA: Diagnosis not present

## 2021-11-05 DIAGNOSIS — G40909 Epilepsy, unspecified, not intractable, without status epilepticus: Secondary | ICD-10-CM | POA: Diagnosis present

## 2021-11-05 DIAGNOSIS — F603 Borderline personality disorder: Secondary | ICD-10-CM | POA: Diagnosis present

## 2021-11-05 DIAGNOSIS — R748 Abnormal levels of other serum enzymes: Secondary | ICD-10-CM | POA: Diagnosis not present

## 2021-11-05 DIAGNOSIS — R339 Retention of urine, unspecified: Secondary | ICD-10-CM | POA: Diagnosis present

## 2021-11-05 DIAGNOSIS — F909 Attention-deficit hyperactivity disorder, unspecified type: Secondary | ICD-10-CM | POA: Diagnosis present

## 2021-11-05 DIAGNOSIS — E860 Dehydration: Secondary | ICD-10-CM | POA: Diagnosis present

## 2021-11-05 LAB — BASIC METABOLIC PANEL
Anion gap: 11 (ref 5–15)
Anion gap: 11 (ref 5–15)
Anion gap: 12 (ref 5–15)
Anion gap: 13 (ref 5–15)
Anion gap: 8 (ref 5–15)
Anion gap: 8 (ref 5–15)
Anion gap: 9 (ref 5–15)
BUN: 11 mg/dL (ref 6–20)
BUN: 7 mg/dL (ref 6–20)
BUN: 7 mg/dL (ref 6–20)
BUN: 7 mg/dL (ref 6–20)
BUN: 7 mg/dL (ref 6–20)
BUN: 8 mg/dL (ref 6–20)
BUN: 9 mg/dL (ref 6–20)
CO2: 19 mmol/L — ABNORMAL LOW (ref 22–32)
CO2: 19 mmol/L — ABNORMAL LOW (ref 22–32)
CO2: 20 mmol/L — ABNORMAL LOW (ref 22–32)
CO2: 21 mmol/L — ABNORMAL LOW (ref 22–32)
CO2: 23 mmol/L (ref 22–32)
CO2: 24 mmol/L (ref 22–32)
CO2: 24 mmol/L (ref 22–32)
Calcium: 7.7 mg/dL — ABNORMAL LOW (ref 8.9–10.3)
Calcium: 7.7 mg/dL — ABNORMAL LOW (ref 8.9–10.3)
Calcium: 7.8 mg/dL — ABNORMAL LOW (ref 8.9–10.3)
Calcium: 7.9 mg/dL — ABNORMAL LOW (ref 8.9–10.3)
Calcium: 8 mg/dL — ABNORMAL LOW (ref 8.9–10.3)
Calcium: 8 mg/dL — ABNORMAL LOW (ref 8.9–10.3)
Calcium: 8.2 mg/dL — ABNORMAL LOW (ref 8.9–10.3)
Chloride: 102 mmol/L (ref 98–111)
Chloride: 104 mmol/L (ref 98–111)
Chloride: 105 mmol/L (ref 98–111)
Chloride: 105 mmol/L (ref 98–111)
Chloride: 105 mmol/L (ref 98–111)
Chloride: 107 mmol/L (ref 98–111)
Chloride: 107 mmol/L (ref 98–111)
Creatinine, Ser: 0.56 mg/dL — ABNORMAL LOW (ref 0.61–1.24)
Creatinine, Ser: 0.59 mg/dL — ABNORMAL LOW (ref 0.61–1.24)
Creatinine, Ser: 0.63 mg/dL (ref 0.61–1.24)
Creatinine, Ser: 0.68 mg/dL (ref 0.61–1.24)
Creatinine, Ser: 0.74 mg/dL (ref 0.61–1.24)
Creatinine, Ser: 0.74 mg/dL (ref 0.61–1.24)
Creatinine, Ser: 0.76 mg/dL (ref 0.61–1.24)
GFR, Estimated: >60 mL/min (ref >60)
GFR, Estimated: >60 mL/min (ref >60)
GFR, Estimated: >60 mL/min (ref >60)
GFR, Estimated: >60 mL/min (ref >60)
GFR, Estimated: >60 mL/min (ref >60)
GFR, Estimated: >60 mL/min (ref >60)
GFR, Estimated: >60 mL/min (ref >60)
Glucose, Bld: 168 mg/dL — ABNORMAL HIGH (ref 70–99)
Glucose, Bld: 179 mg/dL — ABNORMAL HIGH (ref 70–99)
Glucose, Bld: 193 mg/dL — ABNORMAL HIGH (ref 70–99)
Glucose, Bld: 194 mg/dL — ABNORMAL HIGH (ref 70–99)
Glucose, Bld: 203 mg/dL — ABNORMAL HIGH (ref 70–99)
Glucose, Bld: 237 mg/dL — ABNORMAL HIGH (ref 70–99)
Glucose, Bld: 239 mg/dL — ABNORMAL HIGH (ref 70–99)
Potassium: 3.5 mmol/L (ref 3.5–5.1)
Potassium: 3.6 mmol/L (ref 3.5–5.1)
Potassium: 3.6 mmol/L (ref 3.5–5.1)
Potassium: 3.7 mmol/L (ref 3.5–5.1)
Potassium: 3.8 mmol/L (ref 3.5–5.1)
Potassium: 4.1 mmol/L (ref 3.5–5.1)
Potassium: 4.1 mmol/L (ref 3.5–5.1)
Sodium: 135 mmol/L (ref 135–145)
Sodium: 135 mmol/L (ref 135–145)
Sodium: 137 mmol/L (ref 135–145)
Sodium: 137 mmol/L (ref 135–145)
Sodium: 137 mmol/L (ref 135–145)
Sodium: 138 mmol/L (ref 135–145)
Sodium: 138 mmol/L (ref 135–145)

## 2021-11-05 LAB — GLUCOSE, CAPILLARY
Glucose-Capillary: 130 mg/dL — ABNORMAL HIGH (ref 70–99)
Glucose-Capillary: 135 mg/dL — ABNORMAL HIGH (ref 70–99)
Glucose-Capillary: 148 mg/dL — ABNORMAL HIGH (ref 70–99)
Glucose-Capillary: 150 mg/dL — ABNORMAL HIGH (ref 70–99)
Glucose-Capillary: 159 mg/dL — ABNORMAL HIGH (ref 70–99)
Glucose-Capillary: 162 mg/dL — ABNORMAL HIGH (ref 70–99)
Glucose-Capillary: 164 mg/dL — ABNORMAL HIGH (ref 70–99)
Glucose-Capillary: 167 mg/dL — ABNORMAL HIGH (ref 70–99)
Glucose-Capillary: 175 mg/dL — ABNORMAL HIGH (ref 70–99)
Glucose-Capillary: 186 mg/dL — ABNORMAL HIGH (ref 70–99)
Glucose-Capillary: 193 mg/dL — ABNORMAL HIGH (ref 70–99)
Glucose-Capillary: 193 mg/dL — ABNORMAL HIGH (ref 70–99)
Glucose-Capillary: 199 mg/dL — ABNORMAL HIGH (ref 70–99)
Glucose-Capillary: 201 mg/dL — ABNORMAL HIGH (ref 70–99)
Glucose-Capillary: 203 mg/dL — ABNORMAL HIGH (ref 70–99)
Glucose-Capillary: 207 mg/dL — ABNORMAL HIGH (ref 70–99)
Glucose-Capillary: 208 mg/dL — ABNORMAL HIGH (ref 70–99)
Glucose-Capillary: 227 mg/dL — ABNORMAL HIGH (ref 70–99)
Glucose-Capillary: 237 mg/dL — ABNORMAL HIGH (ref 70–99)
Glucose-Capillary: 241 mg/dL — ABNORMAL HIGH (ref 70–99)
Glucose-Capillary: 248 mg/dL — ABNORMAL HIGH (ref 70–99)
Glucose-Capillary: 256 mg/dL — ABNORMAL HIGH (ref 70–99)
Glucose-Capillary: 276 mg/dL — ABNORMAL HIGH (ref 70–99)

## 2021-11-05 LAB — URINALYSIS, ROUTINE W REFLEX MICROSCOPIC
Bilirubin Urine: NEGATIVE
Glucose, UA: >=500 mg/dL — AB
Hgb urine dipstick: NEGATIVE
Ketones, ur: 20 mg/dL — AB
Leukocytes,Ua: NEGATIVE
Nitrite: NEGATIVE
Protein, ur: NEGATIVE mg/dL
Specific Gravity, Urine: 1.016 (ref 1.005–1.030)
pH: 5 (ref 5.0–8.0)

## 2021-11-05 LAB — HEPATITIS PANEL, ACUTE
HCV Ab: REACTIVE — AB
Hep A IgM: NONREACTIVE
Hep B C IgM: NONREACTIVE
Hepatitis B Surface Ag: NONREACTIVE

## 2021-11-05 LAB — BETA-HYDROXYBUTYRIC ACID: Beta-Hydroxybutyric Acid: 0.53 mmol/L — ABNORMAL HIGH (ref 0.05–0.27)

## 2021-11-05 MED ORDER — SODIUM CHLORIDE 0.9 % IV BOLUS
500.0000 mL | Freq: Once | INTRAVENOUS | Status: AC
  Administered 2021-11-05: 500 mL via INTRAVENOUS

## 2021-11-05 MED ORDER — LORAZEPAM 2 MG/ML IJ SOLN
0.5000 mg | Freq: Three times a day (TID) | INTRAMUSCULAR | Status: DC | PRN
  Administered 2021-11-08: 0.5 mg via INTRAVENOUS
  Filled 2021-11-05: qty 1

## 2021-11-05 MED ORDER — POTASSIUM CHLORIDE CRYS ER 20 MEQ PO TBCR
40.0000 meq | EXTENDED_RELEASE_TABLET | Freq: Once | ORAL | Status: AC
  Administered 2021-11-05: 40 meq via ORAL
  Filled 2021-11-05: qty 2

## 2021-11-05 MED ORDER — TAMSULOSIN HCL 0.4 MG PO CAPS
0.4000 mg | ORAL_CAPSULE | Freq: Every day | ORAL | Status: DC
  Administered 2021-11-05 – 2021-11-08 (×4): 0.4 mg via ORAL
  Filled 2021-11-05 (×4): qty 1

## 2021-11-05 MED ORDER — HALOPERIDOL 5 MG PO TABS
5.0000 mg | ORAL_TABLET | Freq: Two times a day (BID) | ORAL | Status: DC | PRN
  Administered 2021-11-05 – 2021-11-08 (×2): 5 mg via ORAL
  Filled 2021-11-05: qty 1
  Filled 2021-11-05: qty 5

## 2021-11-05 MED ORDER — POTASSIUM CHLORIDE CRYS ER 20 MEQ PO TBCR
40.0000 meq | EXTENDED_RELEASE_TABLET | Freq: Once | ORAL | Status: AC
Start: 2021-11-05 — End: 2021-11-05
  Administered 2021-11-05: 40 meq via ORAL
  Filled 2021-11-05: qty 2

## 2021-11-05 NOTE — Progress Notes (Addendum)
?PROGRESS NOTE ? ? ? ?[name] [name].  OIZ:[id number] DOB: 06-Aug-2000 DOA: 11/04/2021 ?PCP: Patient, No Pcp Per (Inactive)  ? ?Brief Narrative: ?21 year old past medical history significant for diabetes type 1, asthma, depression, seizure disorder, ODD, schizoaffective disorder, BPH. ?Recurrent admission for DKA, noncompliant with medication, methadone abuse, admitted with DKA, abdominal pain.  ? ? ? ? ?Assessment & Plan: ?  ?Principal Problem: ?  DKA, type 1 ([place]) ?Active Problems: ?  Epileptic seizure ([place]) ?  Chest pain ?  Abnormal transaminases ?  Type I diabetes mellitus ([place]) ?  ADD (attention deficit disorder) ?  High anion gap metabolic acidosis ?  AKI (acute kidney injury) ([place]) ? ?1-DKA type; DM Type 1:  ?In the setting of noncompliance with medication ?Patient presented nausea vomiting abdominal pain, increased anion gap metabolic acidosis, gap of 30, bicarb of 9, CBG 621. ?He has received 2 L of IV fluids. ?B-met  every 4 hours. ?B hydroxybutyric acid down to 0.5 ?Admitted to the stepdown unit ?UA negative, Chest x ray negative for PNA  ?-gap 11, bicarb still 19, await for Bicarb to increase above 21 to proceed with transition to long acting insulin.  ? ?2-Hyponatremia: Mild in the setting of hyperglycemia pseudohyponatremia ?  ?3-Transaminases, per bilirubinemia: ?CT scan noted: Gallbladder within normal limits. No hyperdense ?gallstone. No biliary dilatation. ?Viral Hepatitis panel hepatitis C reactive. Check Hepatitis C RNA. .  , HIV non reactive.  ?Follow trend.  ?  ?4-Chest pain;  ?Suspect related to vomiting.  ?Initial troponin negative.  ?Continue with  IV protonix.  ?  ?5-Abdominal pain, nausea, vomiting.  ?-Suspect related to DKA.  ?-CT abdomen pelvis: No acute abdominopelvic findings. Markedly distended urinary bladder above the level of the umbilicus. Correlate for urinary retention. Fecalization of distal small bowel contents suggesting slow transit. No evidence of bowel obstruction.  Hepatomegaly. ?-He was able to urinate last night in the ED.  ?-IV Protonix, PRN Zofran.  ? check [place].  ? ?6-Difficulty Voiding;  ?He report difficulty voiding for 3 years. He has to force to be able to urinate.  ?He was able to urinate 1 L today. Plan to check PVR.  ?Urology consulted, patient might need foley catheter if high residual or intermittent catheterization.  ? ? ?History of Seizure; Pharmacy will reviewed with patient what  medication he was suppose to take.  ?  ?-schizoaffective disorder bipolar disorder, ?Will continue with Zoloft. Unclear what medications he has been taking. He was at [place] last time. Supposed to be on haldol and Guanfacine.  ?-Psych consulted, to assist with meds as well ?-Will order PRN haldol for overnight ?  ?AKI; started on IV fluids. Cr peak 1.3 prior cr 0.7 ? Cr down to 0.7 ? ? ?Estimated body mass index is 21.62 kg/m? as calculated from the following: ?  Height as of this encounter: '5\' 11"'  (1.803 m). ?  Weight as of this encounter: 70.3 kg. ? ? ?DVT prophylaxis: Lovenox ?Code Status: Full code ?Family Communication: care discussed with patient.  ?Disposition Plan:  ?Status is: Inpatient ?Remains inpatient appropriate because: management of DKA>  ? ? ? ?Consultants:  ?Urology  ? ?Procedures:  ?None ? ?Antimicrobials:  ? ? ?Subjective: ?He is still complaining of abdominal pain.  ? ? ?Objective: ?Vitals:  ? 11/05/21 0600 11/05/21 0800 11/05/21 1100 11/05/21 1200  ?BP: (!) 107/51 113/69 133/80 (!) 160/77  ?Pulse: 87 78 93 97  ?Resp: 12 12 (!) 21 (!) 25  ?Temp:  97.8 ?F (36.6 ?  C)  97.7 ?F (36.5 ?C)  ?TempSrc:  Oral  Oral  ?SpO2: 97% 99% 99% 99%  ?Weight:      ?Height:      ? ? ?Intake/Output Summary (Last 24 hours) at 11/05/2021 1403 ?Last data filed at 11/05/2021 0800 ?Gross per 24 hour  ?Intake 4293.27 ml  ?Output 2850 ml  ?Net 1443.27 ml  ? ?Filed Weights  ? 11/04/21 1659  ?Weight: 70.3 kg  ? ? ?Examination: ? ?General exam: Appears calm and comfortable  ?Respiratory system: Clear  to auscultation. Respiratory effort normal. ?Cardiovascular system: S1 & S2 heard, RRR. No JVD, murmurs, rubs, gallops or clicks. No pedal edema. ?Gastrointestinal system: Abdomen is nondistended, soft and nontender. No organomegaly or masses felt. Normal bowel sounds heard. ?Central nervous system: Alert and oriented. No focal neurological deficits. ?Extremities: Symmetric 5 x 5 power. ? ? ? ? ?Data Reviewed: I have personally reviewed following labs and imaging studies ? ?CBC: ?Recent Labs  ?Lab 11/04/21 ?1420  ?WBC 9.4  ?HGB 15.2  ?HCT 44.7  ?MCV 86.3  ?PLT 354  ? ?Basic Metabolic Panel: ?Recent Labs  ?Lab 11/04/21 ?2019 11/05/21 ?0001 11/05/21 ?0319 11/05/21 ?3491 11/05/21 ?1131  ?NA 141 137 138 137 135  ?K 4.7 4.1 3.8 3.6 4.1  ?CL 109 107 107 104 105  ?CO2 9* 19* 23 21* 19*  ?GLUCOSE 243* 194* 168* 179* 237*  ?BUN '15 11 9 8 7  ' ?CREATININE 1.25* 0.74 0.63 0.74 0.76  ?CALCIUM 8.2* 7.9* 8.0* 8.0* 7.7*  ? ?GFR: ?Estimated Creatinine Clearance: 144 mL/min (by C-G formula based on SCr of 0.76 mg/dL). ?Liver Function Tests: ?Recent Labs  ?Lab 11/04/21 ?1420  ?AST 250*  ?ALT 249*  ?ALKPHOS 153*  ?BILITOT 3.2*  ?PROT 7.6  ?ALBUMIN 3.8  ? ?Recent Labs  ?Lab 11/04/21 ?1420  ?LIPASE 31  ? ?No results for input(s): AMMONIA in the last 168 hours. ?Coagulation Profile: ?No results for input(s): INR, PROTIME in the last 168 hours. ?Cardiac Enzymes: ?No results for input(s): CKTOTAL, CKMB, CKMBINDEX, TROPONINI in the last 168 hours. ?BNP (last 3 results) ?No results for input(s): PROBNP in the last 8760 hours. ?HbA1C: ?No results for input(s): HGBA1C in the last 72 hours. ?CBG: ?Recent Labs  ?Lab 11/05/21 ?0954 11/05/21 ?1102 11/05/21 ?1157 11/05/21 ?1256 11/05/21 ?1352  ?GLUCAP 276* 241* 201* 237* 248*  ? ?Lipid Profile: ?No results for input(s): CHOL, HDL, LDLCALC, TRIG, CHOLHDL, LDLDIRECT in the last 72 hours. ?Thyroid Function Tests: ?No results for input(s): TSH, T4TOTAL, FREET4, T3FREE, THYROIDAB in the last 72  hours. ?Anemia Panel: ?No results for input(s): VITAMINB12, FOLATE, FERRITIN, TIBC, IRON, RETICCTPCT in the last 72 hours. ?Sepsis Labs: ?No results for input(s): PROCALCITON, LATICACIDVEN in the last 168 hours. ? ?Recent Results (from the past 240 hour(s))  ?MRSA Next Gen by PCR, Nasal     Status: None  ? Collection Time: 11/04/21  5:57 PM  ? Specimen: Nasal Mucosa; Nasal Swab  ?Result Value Ref Range Status  ? MRSA by PCR Next Gen NOT DETECTED NOT DETECTED Final  ?  Comment: (NOTE) ?The GeneXpert MRSA Assay (FDA approved for NASAL specimens only), ?is one component of a comprehensive MRSA colonization surveillance ?program. It is not intended to diagnose MRSA infection nor to guide ?or monitor treatment for MRSA infections. ?Test performance is not FDA approved in patients less than 2 years ?old. ?Performed at [facility], [address] ?[place], [place] [address] ?  ?  ? ? ? ? ? ?Radiology  Studies: ?CT ABDOMEN PELVIS WO CONTRAST ? ?Result Date: 11/04/2021 ?CLINICAL DATA:  Abdominal pain, nausea, vomiting EXAM: CT ABDOMEN AND PELVIS WITHOUT CONTRAST TECHNIQUE: Multidetector CT imaging of the abdomen and pelvis was performed following the standard protocol without IV contrast. RADIATION DOSE REDUCTION: This exam was performed according to the departmental dose-optimization program which includes automated exposure control, adjustment of the mA and/or kV according to patient size and/or use of iterative reconstruction technique. COMPARISON:  03/13/2021 FINDINGS: Lower chest: Included lung bases are clear.  Heart size is normal. Hepatobiliary: Hepatomegaly. No focal liver lesion identified on unenhanced images. Gallbladder within normal limits. No hyperdense gallstone. No biliary dilatation. Pancreas: Unremarkable. No pancreatic ductal dilatation or surrounding inflammatory changes. Spleen: Normal in size without focal abnormality. Adrenals/Urinary Tract: Adrenal glands are unremarkable. Kidneys  are normal, without renal calculi, focal lesion, or hydronephrosis. Urinary bladder is markedly distended above the level of the umbilicus. Stomach/Bowel: Stomach is within normal limits. Appendix appears normal (serie

## 2021-11-05 NOTE — Consult Note (Signed)
Physician requesting consult: Belkys Regalado ? ?CC: Urinary retention ?HPI: ?11/05/2021 ?21 year old male with a history of diabetes type 1, depression, seizures, ODD, schizoaffective disorder and recurrent admissions for DKA and is noncompliant with medication, methadone abuse who is currently admitted with DKA.  He also had some abdominal pain nausea/vomiting.  He underwent a CT scan yesterday that revealed a markedly distended bladder above the level of the umbilicus.  On CT, he had no evidence of hydronephrosis.  He was undergoing a abdominal ultrasound today and on the ultrasound looked like he was developing some mild hydronephrosis bilaterally.  He states that he has to void with Crede maneuver.  He often does not have the sensation to void.  When he does feel like his bladder is full, he has some flank pain that improves after voiding.  Renal function is normal with a creatinine of 0.68.  Though he is voiding intermittently, his bladder was still up to the level of the umbilicus on ultrasound. ? ? ?Past Medical History:  ?Diagnosis Date  ? ADD (attention deficit disorder)   ? Allergy   ? Anxiety   ? Asthma   ? Depression   ? Epileptic seizure ([place])   ? "both petit and grand mal; last sz ~ 2 wk ago" (06/17/2013)  ? AYTKZSWF(093.2)   ? "2-3 times/wk usually" (06/17/2013)  ? Heart murmur   ? "heard a slight one earlier today" (06/17/2013)  ? Homeless   ? Migraine   ? "maybe once/month; it's severe" (06/17/2013)  ? ODD (oppositional defiant disorder)   ? Sickle cell trait ([place])   ? Type I diabetes mellitus ([place])   ? "that's what they're thinking now" (06/17/2013)  ? Vision abnormalities   ? "takes him longer to focus cause; from his sz" (06/17/2013)  ? ?Past Surgical History:  ?Procedure Laterality Date  ? CIRCUMCISION  2000  ? FINGER SURGERY Left 2001  ? "crushed pinky; had to repair it" (06/17/2013)  ? ? ?Home Medications:  ?Medications Prior to Admission  ?Medication Sig Dispense Refill Last Dose  ?  carbamazepine (TEGRETOL) 200 MG tablet Take 1 tablet (200 mg total) by mouth every 12 (twelve) hours. 60 tablet 0 Past Week  ? chlorproMAZINE (THORAZINE) 10 MG tablet Take 1 tablet (10 mg total) by mouth 2 (two) times daily. 60 tablet 0 Past Week  ? chlorproMAZINE (THORAZINE) 25 MG tablet Take 1 tablet (25 mg total) by mouth at bedtime. 30 tablet 0 Past Week  ? cyanocobalamin 1000 MCG tablet Take 1,000 mcg by mouth daily.   Past Week  ? Digestive Enzymes (DIGESTIVE SUPPORT) CAPS Take 1 capsule by mouth daily.   Past Week  ? EPINEPHrine 0.3 mg/0.3 mL IJ SOAJ injection Inject 0.3 mg into the muscle once as needed for anaphylaxis (severe allergic reaction).   unk  ? insulin aspart (NOVOLOG FLEXPEN) 100 UNIT/ML FlexPen Inject 1-15 Units into the skin 3 (three) times daily with meals. 1 unit/g of carbohydrate sliding scale. (Patient taking differently: Inject 0-14 Units into the skin 3 (three) times daily with meals. Per sliding scale) 15 mL 6 Past Week  ? insulin glargine (LANTUS) 100 UNIT/ML Solostar Pen Inject 30 Units into the skin 2 (two) times daily. (Patient taking differently: Inject 40 Units into the skin in the morning and at bedtime.) 15 mL 6 Past Week  ? lisinopril (ZESTRIL) 5 MG tablet Take 1 tablet (5 mg total) by mouth daily. 30 tablet 0 Past Week  ? ondansetron (ZOFRAN) 4 MG tablet Take  4 mg by mouth every 8 (eight) hours as needed for nausea/vomiting.   unk  ? pantoprazole (PROTONIX) 40 MG tablet Take 40 mg by mouth 2 (two) times daily.   Past Week  ? QUEtiapine (SEROQUEL) 50 MG tablet Take 50 mg by mouth at bedtime.   Past Week  ? sertraline (ZOLOFT) 50 MG tablet Take 50 mg by mouth daily.   Past Week  ? albuterol (VENTOLIN HFA) 108 (90 Base) MCG/ACT inhaler Inhale 2 puffs into the lungs every 6 (six) hours as needed for wheezing or shortness of breath. (Patient not taking: Reported on 11/05/2021)   Not Taking  ? ARIPiprazole (ABILIFY) 5 MG tablet Take 5 mg by mouth daily. (Patient not taking: Reported on  11/05/2021)   Not Taking  ? blood glucose meter kit and supplies KIT Dispense based on patient and insurance preference. Use up to four times daily as directed. (FOR ICD-9 250.00, 250.01). 1 each 0   ? glucose blood (FREESTYLE TEST STRIPS) test strip Use as instructed 100 each 0   ? guanFACINE (INTUNIV) 1 MG TB24 ER tablet Take 1 mg by mouth daily.     ? haloperidol (HALDOL) 5 MG tablet Take 5 mg by mouth 2 (two) times daily.     ? Insulin Pen Needle (ADVOCATE INSULIN PEN NEEDLES) 29G X 12.7MM MISC For type I diabetes 100 each 3   ? nicotine (NICODERM CQ - DOSED IN MG/24 HOURS) 14 mg/24hr patch Place 1 patch (14 mg total) onto the skin daily. (Patient not taking: Reported on 11/05/2021) 7 patch 0 Not Taking  ? ?Allergies:  ?Allergies  ?Allergen Reactions  ? Bee Venom Anaphylaxis  ? Fish Allergy Anaphylaxis  ? Shellfish Allergy Anaphylaxis  ? Tea Anaphylaxis  ? ? ?Family History  ?Problem Relation Age of Onset  ? Asthma Mother   ? COPD Mother   ? Other Mother   ?     possible autoimmune, unclear  ? Migraines Sister   ?     Hemiplegic Migraines   ? Diabetes Maternal Grandmother   ? Heart disease Maternal Grandmother   ? Hypertension Maternal Grandmother   ? Mental illness Maternal Grandmother   ? Heart disease Maternal Grandfather   ? Hyperlipidemia Paternal Grandmother   ? Hyperlipidemia Paternal Grandfather   ? ?Social History:  reports that he has been smoking cigarettes. He has been smoking an average of .5 packs per day. He has never used smokeless tobacco. He reports current drug use. Drug: Methamphetamines. He reports that he does not drink alcohol. ? ?ROS: ?A complete review of systems was performed.  All systems are negative except for pertinent findings as noted. ?ROS ? ? ?Physical Exam:  ?Vital signs in last 24 hours: ?Temp:  [97.7 ?F (36.5 ?C)-98.7 ?F (37.1 ?C)] 98.5 ?F (36.9 ?C) (05/09 1600) ?Pulse Rate:  [78-113] 85 (05/09 1600) ?Resp:  [9-25] 18 (05/09 1600) ?BP: (107-160)/(51-82) 141/82 (05/09 1600) ?SpO2:   [96 %-99 %] 97 % (05/09 1600) ?General:  Alert and oriented, No acute distress ?HEENT: Normocephalic, atraumatic ?Neck: No JVD or lymphadenopathy ?Cardiovascular: Regular rate and rhythm ?Lungs: Regular rate and effort ?Abdomen: Soft, nontender, nondistended, no abdominal masses ?Back: No CVA tenderness ?Extremities: No edema ?Neurologic: Grossly intact ? ?Laboratory Data:  ?Results for orders placed or performed during the hospital encounter of 11/04/21 (from the past 24 hour(s))  ?Urinalysis, Routine w reflex microscopic     Status: Abnormal  ? Collection Time: 11/04/21  5:08 PM  ?Result Value Ref  Range  ? Color, Urine STRAW (A) YELLOW  ? APPearance CLEAR CLEAR  ? Specific Gravity, Urine 1.018 1.005 - 1.030  ? pH 5.0 5.0 - 8.0  ? Glucose, UA >=500 (A) NEGATIVE mg/dL  ? Hgb urine dipstick NEGATIVE NEGATIVE  ? Bilirubin Urine NEGATIVE NEGATIVE  ? Ketones, ur 80 (A) NEGATIVE mg/dL  ? Protein, ur NEGATIVE NEGATIVE mg/dL  ? Nitrite NEGATIVE NEGATIVE  ? Leukocytes,Ua NEGATIVE NEGATIVE  ? Bacteria, UA NONE SEEN NONE SEEN  ? Mucus PRESENT   ?Rapid urine drug screen (hospital performed)     Status: None  ? Collection Time: 11/04/21  5:08 PM  ?Result Value Ref Range  ? Opiates NONE DETECTED NONE DETECTED  ? Cocaine NONE DETECTED NONE DETECTED  ? Benzodiazepines NONE DETECTED NONE DETECTED  ? Amphetamines NONE DETECTED NONE DETECTED  ? Tetrahydrocannabinol NONE DETECTED NONE DETECTED  ? Barbiturates NONE DETECTED NONE DETECTED  ?MRSA Next Gen by PCR, Nasal     Status: None  ? Collection Time: 11/04/21  5:57 PM  ? Specimen: Nasal Mucosa; Nasal Swab  ?Result Value Ref Range  ? MRSA by PCR Next Gen NOT DETECTED NOT DETECTED  ?Glucose, capillary     Status: Abnormal  ? Collection Time: 11/04/21  5:59 PM  ?Result Value Ref Range  ? Glucose-Capillary 593 (HH) 70 - 99 mg/dL  ? Comment 1 Notify RN   ? Comment 2 Document in Chart   ?HIV Antibody (routine testing w rflx)     Status: None  ? Collection Time: 11/04/21  6:14 PM  ?Result  Value Ref Range  ? HIV Screen 4th Generation wRfx Non Reactive Non Reactive  ?Hepatitis panel, acute     Status: Abnormal  ? Collection Time: 11/04/21  6:14 PM  ?Result Value Ref Range  ? Hepatitis B Surface
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
## 2021-11-06 DIAGNOSIS — R748 Abnormal levels of other serum enzymes: Secondary | ICD-10-CM

## 2021-11-06 DIAGNOSIS — N179 Acute kidney failure, unspecified: Secondary | ICD-10-CM

## 2021-11-06 DIAGNOSIS — E8729 Other acidosis: Secondary | ICD-10-CM

## 2021-11-06 DIAGNOSIS — E1010 Type 1 diabetes mellitus with ketoacidosis without coma: Secondary | ICD-10-CM | POA: Diagnosis not present

## 2021-11-06 LAB — CBC
HCT: 37.7 % — ABNORMAL LOW (ref 39.0–52.0)
Hemoglobin: 11.9 g/dL — ABNORMAL LOW (ref 13.0–17.0)
MCH: 29.5 pg (ref 26.0–34.0)
MCHC: 31.6 g/dL (ref 30.0–36.0)
MCV: 93.3 fL (ref 80.0–100.0)
Platelets: 261 10*3/uL (ref 150–400)
RBC: 4.04 MIL/uL — ABNORMAL LOW (ref 4.22–5.81)
RDW: 13.7 % (ref 11.5–15.5)
WBC: 6.6 10*3/uL (ref 4.0–10.5)
nRBC: 0 % (ref 0.0–0.2)

## 2021-11-06 LAB — HCV RNA QUANT
HCV Quantitative Log: 6.52 log10 IU/mL (ref >1.70)
HCV Quantitative: 3310000 IU/mL (ref >50)

## 2021-11-06 LAB — GLUCOSE, CAPILLARY
Glucose-Capillary: 108 mg/dL — ABNORMAL HIGH (ref 70–99)
Glucose-Capillary: 115 mg/dL — ABNORMAL HIGH (ref 70–99)
Glucose-Capillary: 127 mg/dL — ABNORMAL HIGH (ref 70–99)
Glucose-Capillary: 141 mg/dL — ABNORMAL HIGH (ref 70–99)
Glucose-Capillary: 168 mg/dL — ABNORMAL HIGH (ref 70–99)
Glucose-Capillary: 174 mg/dL — ABNORMAL HIGH (ref 70–99)
Glucose-Capillary: 177 mg/dL — ABNORMAL HIGH (ref 70–99)
Glucose-Capillary: 190 mg/dL — ABNORMAL HIGH (ref 70–99)
Glucose-Capillary: 203 mg/dL — ABNORMAL HIGH (ref 70–99)
Glucose-Capillary: 221 mg/dL — ABNORMAL HIGH (ref 70–99)
Glucose-Capillary: 363 mg/dL — ABNORMAL HIGH (ref 70–99)
Glucose-Capillary: 392 mg/dL — ABNORMAL HIGH (ref 70–99)

## 2021-11-06 LAB — BASIC METABOLIC PANEL
Anion gap: 9 (ref 5–15)
Anion gap: 7 (ref 5–15)
BUN: 9 mg/dL (ref 6–20)
BUN: 9 mg/dL (ref 6–20)
CO2: 26 mmol/L (ref 22–32)
CO2: 29 mmol/L (ref 22–32)
Calcium: 7.9 mg/dL — ABNORMAL LOW (ref 8.9–10.3)
Calcium: 8.1 mg/dL — ABNORMAL LOW (ref 8.9–10.3)
Chloride: 101 mmol/L (ref 98–111)
Chloride: 102 mmol/L (ref 98–111)
Creatinine, Ser: 0.53 mg/dL — ABNORMAL LOW (ref 0.61–1.24)
Creatinine, Ser: 0.42 mg/dL — ABNORMAL LOW (ref 0.61–1.24)
GFR, Estimated: >60 mL/min (ref >60)
GFR, Estimated: >60 mL/min (ref >60)
Glucose, Bld: 160 mg/dL — ABNORMAL HIGH (ref 70–99)
Glucose, Bld: 147 mg/dL — ABNORMAL HIGH (ref 70–99)
Potassium: 3.3 mmol/L — ABNORMAL LOW (ref 3.5–5.1)
Potassium: 3.6 mmol/L (ref 3.5–5.1)
Sodium: 136 mmol/L (ref 135–145)
Sodium: 138 mmol/L (ref 135–145)

## 2021-11-06 LAB — BETA-HYDROXYBUTYRIC ACID: Beta-Hydroxybutyric Acid: 0.19 mmol/L (ref 0.05–0.27)

## 2021-11-06 MED ORDER — INSULIN ASPART 100 UNIT/ML IJ SOLN
0.0000 [IU] | Freq: Three times a day (TID) | INTRAMUSCULAR | Status: DC
  Administered 2021-11-06: 5 [IU] via SUBCUTANEOUS
  Administered 2021-11-06: 3 [IU] via SUBCUTANEOUS
  Administered 2021-11-07 (×2): 5 [IU] via SUBCUTANEOUS
  Administered 2021-11-07: 7 [IU] via SUBCUTANEOUS
  Administered 2021-11-08: 5 [IU] via SUBCUTANEOUS
  Administered 2021-11-08: 9 [IU] via SUBCUTANEOUS

## 2021-11-06 MED ORDER — INSULIN GLARGINE-YFGN 100 UNIT/ML ~~LOC~~ SOLN
24.0000 [IU] | Freq: Two times a day (BID) | SUBCUTANEOUS | Status: DC
  Administered 2021-11-06 – 2021-11-07 (×3): 24 [IU] via SUBCUTANEOUS
  Filled 2021-11-06 (×4): qty 0.24

## 2021-11-06 MED ORDER — GLUCERNA SHAKE PO LIQD
237.0000 mL | Freq: Two times a day (BID) | ORAL | Status: DC
  Administered 2021-11-06 – 2021-11-08 (×4): 237 mL via ORAL
  Filled 2021-11-06 (×5): qty 237

## 2021-11-06 MED ORDER — INSULIN GLARGINE-YFGN 100 UNIT/ML ~~LOC~~ SOLN
24.0000 [IU] | Freq: Two times a day (BID) | SUBCUTANEOUS | Status: DC
  Filled 2021-11-06: qty 0.24

## 2021-11-06 MED ORDER — ADULT MULTIVITAMIN W/MINERALS CH
1.0000 | ORAL_TABLET | Freq: Every day | ORAL | Status: DC
  Administered 2021-11-06 – 2021-11-08 (×3): 1 via ORAL
  Filled 2021-11-06 (×3): qty 1

## 2021-11-06 MED ORDER — INSULIN ASPART 100 UNIT/ML IJ SOLN
5.0000 [IU] | Freq: Three times a day (TID) | INTRAMUSCULAR | Status: DC
  Administered 2021-11-06 – 2021-11-08 (×7): 5 [IU] via SUBCUTANEOUS

## 2021-11-06 NOTE — Progress Notes (Addendum)
[facility] ? ?PROGRESS NOTE ? ?[name] [name]. SWF:[id number] DOB: 12-28-2000 DOA: 11/04/2021 ?PCP: Patient, No Pcp Per (Inactive) ? ? ?Brief HPI:   ?21 year old male with past medical history of diabetes mellitus type 1, asthma, depression, seizure disorder, schizoaffective disorder presented with DKA.  Patient has been noncompliant with medications.  Also history of methadone abuse. ? ? ? ?Subjective  ? ?Patient seen and examined, no chest pain or shortness of breath. ? ? Assessment/Plan:  ? ? ?DKA ; diabetes mellitus type 1 ?-Patient presented with DKA in the setting of noncompliance with medication ?-Had anion gap metabolic acidosis with gap of 30, bicarb was 9, CBG was elevated 621 ?-Patient was admitted to stepdown unit ?-UA was negative, chest x-ray was negative for pneumonia ?-Anion gap is closed, will discontinue IV insulin ?-Start sliding scale insulin with NovoLog ?-Start NovoLog 5 units 3 times daily meal coverage ? ?Hyponatremia ?-Resolved ? ?Transaminitis/hyperbilirubinemia/hepatitis C ?-Abdominal ultrasound is unremarkable ?-HCV RNA : 3,310,000  ?-Follow-up gastroenterology as outpatient ? 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 ?Acute kidney injury ?-Likely from dehydration, poor p.o. intake ?-Resolved ? ?Schizoaffective disorder ?-Continue Zoloft ?-Psychiatry consulted; no indication for inpatient psychiatric admission ?-Patient will be provided inpatient rehab facilities list so he can coordinate his own admission ? ?History of seizure ?-Unclear what medication patient was on for seizure at home ?-We will check with pharmacy ? ?Medications ? ?  ? Chlorhexidine Gluconate Cloth  6 each Topical Daily  ? enoxaparin (LOVENOX) injection  40 mg Subcutaneous Q24H  ? feeding supplement (GLUCERNA SHAKE)  237 mL Oral  BID BM  ? insulin aspart  0-9 Units Subcutaneous TID WC  ? insulin aspart  5 Units Subcutaneous TID WC  ? insulin glargine-yfgn  24 Units Subcutaneous BID  ? mouth rinse  15 mL Mouth Rinse BID  ? multivitamin with minerals  1 tablet Oral Daily  ? pantoprazole (PROTONIX) IV  40 mg Intravenous Q12H  ? sertraline  50 mg Oral Daily  ? tamsulosin  0.4 mg Oral Daily  ? ? ? Data Reviewed:  ? ?CBG: ? ?Recent Labs  ?Lab 11/06/21 ?0701 11/06/21 ?0757 11/06/21 ?2202 11/06/21 ?1004 11/06/21 ?1104  ?GLUCAP 127* 108* 190* 221* 203*  ? ? ?SpO2: 99 %  ? ? ?Vitals:  ? 11/06/21 1100 11/06/21 1200 11/06/21 1300 11/06/21 1400  ?BP: 111/61 119/62 128/80 137/84  ?Pulse: 88 85 87 86  ?Resp: 17 14 15 15   ?Temp:  98.3 ?F (36.8 ?C)    ?TempSrc:  Oral    ?SpO2: 98% 94% 97% 99%  ?Weight:      ?Height:      ? ? ? ? ?Data Reviewed: ? ?Basic Metabolic Panel: ?Recent Labs  ?Lab 11/05/21 ?1437 11/05/21 ?1826 11/05/21 ?2241 11/06/21 ?01/06/22 11/06/21 ?01/06/22  ?NA 135 137 138 136 138  ?K 3.7 3.6 3.5 3.3* 3.6  ?CL 102 105 105 101 102  ?CO2 20* 24 24 26 29   ?GLUCOSE 239* 203* 193* 160* 147*  ?BUN 7 7 7 9 9   ?CREATININE 0.68 0.56* 0.59* 0.53* 0.42*  ?CALCIUM 8.2* 7.7* 7.8* 7.9* 8.1*  ? ? ?CBC: ?Recent Labs  ?Lab 11/04/21 ?1420 11/05/21 ?0727 11/05/21 ?0900  ?WBC 9.4 DUPLICATE REQUEST 6.6  ?HGB 15.2 DUPLICATE REQUEST 11.9*  ?HCT 44.7 DUPLICATE REQUEST 37.7*  ?MCV 86.3 DUPLICATE REQUEST 93.3  ?PLT 354 DUPLICATE REQUEST 261  ? ? ?  LFT ?Recent Labs  ?Lab 11/04/21 ?1420  ?AST 250*  ?ALT 249*  ?ALKPHOS 153*  ?BILITOT 3.2*  ?PROT 7.6  ?ALBUMIN 3.8  ? ?  ?Antibiotics: ?Anti-infectives (From admission, onward)  ? ? None  ? ?  ? ? ? ?DVT prophylaxis: Lovenox ? ?Code Status: Full code ? ?Family Communication: No family at bedside ? ? ?CONSULTS  ? ? ?Objective  ? ? ?Physical Examination: ? ? ?General-appears in no acute distress ?Heart-S1-S2, regular, no murmur auscultated ?Lungs-clear to auscultation bilaterally, no wheezing or crackles auscultated ?Abdomen-soft,  nontender, no organomegaly ?Extremities-no edema in the lower extremities ?Neuro-alert, oriented x3, no focal deficit noted ? ? ?Status is: Inpatient: Urinary retention ? ? ? ?  ? ? ?Meredeth Ide ?  ?[facility] ?If 7PM-7AM, please contact night-coverage at [email or web address], ?Office  [phone] ? ? ?11/06/2021, 4:31 PM  LOS: 1 day  ? ? ? ? ? ? ? ? ? ? ?  ?

## 2021-11-06 NOTE — Discharge Instructions (Signed)
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
## 2021-11-06 NOTE — Progress Notes (Signed)
Initial Nutrition Assessment ? ?DOCUMENTATION CODES:  ? ?Not applicable ? ?INTERVENTION:  ?- will order Glucerna Shake BID, each supplement provides 220 kcal and 10 grams of protein. ? ?- will order 1 tablet multivitamin with minerals/day. ? ?- complete NFPE when feasible.  ? ?- will place Carbohydrate Counting for People with Diabetes handout in the AVS.  ? ? ?NUTRITION DIAGNOSIS:  ? ?Increased nutrient needs related to acute illness as evidenced by estimated needs. ? ?GOAL:  ? ?Patient will meet greater than or equal to 90% of their needs ? ?MONITOR:  ? ?PO intake, Supplement acceptance, Labs, Weight trends ? ?REASON FOR ASSESSMENT:  ? ?Malnutrition Screening Tool ? ?ASSESSMENT:  ? ?21 y.o. male with medical history of type 1 DM, asthma, depression, seizure disorder, ODD, schizoaffective disorder, bipolar disorder, recurrent admission for DKA, medical non-compliance. He presented to the ED due to N/V, RUQ abdominal pain, chest pain, and neck pain. ? ?Patient sleeping with the lights off and no visitors present at the time of RD visit. Patient does not awake to name call x2. Lunch tray on bedside table, untouched. No meal intake percentages documented since diet advanced from NPO to Carb Modified yesterday at 0830. ? ?Weight on 5/8 was 155 lb and appears to be a stated weight. Weight on 3/9 was 179 lb, weight on 2/13 was 170 lb, and prior to those weights the most recently documented was 199 lb on 03/13/21. ? ? ?Labs reviewed; CBGs: 108-221 mg/dl, creatinine: 8.88 mg/dl, Ca: 8.1 mg/dl. ? ?Medications reviewed; sliding scale novolog, 5 units novolog TID, 24 units semglee/day, 40 mg IV protonix BID, 40 mEq Klor-Con x1 dose 5/9. ? ?IVF; D5-LR @ 125 ml/hr (510 kcal/24 hrs).  ?  ? ?NUTRITION - FOCUSED PHYSICAL EXAM: ? ?Unable at this time.  ? ?Diet Order:   ?Diet Order   ? ?       ?  Diet Carb Modified Fluid consistency: Thin; Room service appropriate? Yes  Diet effective now       ?  ? ?  ?  ? ?  ? ? ?EDUCATION NEEDS:   ? ?Not appropriate for education at this time ? ?Skin:  Skin Assessment: Reviewed RN Assessment ? ?Last BM:  PTA/unknown ? ?Height:  ? ?Ht Readings from Last 1 Encounters:  ?11/04/21 5\' 11"  (1.803 m)  ? ? ?Weight:  ? ?Wt Readings from Last 1 Encounters:  ?11/04/21 70.3 kg  ? ? ? ?BMI:  Body mass index is 21.62 kg/m?. ? ?Estimated Nutritional Needs:  ?Kcal:  2100-2300 kcal ?Protein:  105-115 grams ?Fluid:  >/= 2.1 L/day ? ? ? ? ? ?01/04/22, MS, RD, LDN ?Registered Dietitian II ?Inpatient Clinical Nutrition ?RD pager # and on-call/weekend pager # available in [facility]  ? ?
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
## 2021-11-06 NOTE — Progress Notes (Signed)
Inpatient Diabetes Program Recommendations ? ?AACE/ADA: New Consensus Statement on Inpatient Glycemic Control (2015) ? ?Target Ranges:  Prepandial:   less than 140 mg/dL ?     Peak postprandial:   less than 180 mg/dL (1-2 hours) ?     Critically ill patients:  140 - 180 mg/dL  ? ?Lab Results  ?Component Value Date  ? GLUCAP 392 (H) 11/06/2021  ? HGBA1C 8.8 (H) 08/13/2021  ? ?Inpatient Diabetes Program Recommendations:   ? ?Please consider Semglee 35 units BID.  Patient appears to be very resistant to insulin. CBG was 392 at 4PM.  Has required more basal and meal coverage in past hospitalizations.    ? ? ?Will continue to follow while inpatient. ? ?Thank you, ?Reche Dixon, MSN, CDCES ?Diabetes Coordinator ?Inpatient Diabetes Program ?[phone] (team pager from 8a-5p) ? ? ?

## 2021-11-07 DIAGNOSIS — F1510 Other stimulant abuse, uncomplicated: Secondary | ICD-10-CM

## 2021-11-07 DIAGNOSIS — E1010 Type 1 diabetes mellitus with ketoacidosis without coma: Secondary | ICD-10-CM | POA: Diagnosis not present

## 2021-11-07 DIAGNOSIS — R748 Abnormal levels of other serum enzymes: Secondary | ICD-10-CM | POA: Diagnosis not present

## 2021-11-07 DIAGNOSIS — E8729 Other acidosis: Secondary | ICD-10-CM | POA: Diagnosis not present

## 2021-11-07 DIAGNOSIS — F339 Major depressive disorder, recurrent, unspecified: Secondary | ICD-10-CM

## 2021-11-07 DIAGNOSIS — F1520 Other stimulant dependence, uncomplicated: Secondary | ICD-10-CM

## 2021-11-07 DIAGNOSIS — N179 Acute kidney failure, unspecified: Secondary | ICD-10-CM | POA: Diagnosis not present

## 2021-11-07 LAB — GLUCOSE, CAPILLARY
Glucose-Capillary: 286 mg/dL — ABNORMAL HIGH (ref 70–99)
Glucose-Capillary: 281 mg/dL — ABNORMAL HIGH (ref 70–99)
Glucose-Capillary: 274 mg/dL — ABNORMAL HIGH (ref 70–99)
Glucose-Capillary: 319 mg/dL — ABNORMAL HIGH (ref 70–99)
Glucose-Capillary: 201 mg/dL — ABNORMAL HIGH (ref 70–99)

## 2021-11-07 MED ORDER — PANTOPRAZOLE SODIUM 40 MG PO TBEC
40.0000 mg | DELAYED_RELEASE_TABLET | Freq: Two times a day (BID) | ORAL | Status: DC
  Administered 2021-11-07 – 2021-11-08 (×3): 40 mg via ORAL
  Filled 2021-11-07 (×3): qty 1

## 2021-11-07 MED ORDER — OXYCODONE HCL 5 MG PO TABS
10.0000 mg | ORAL_TABLET | Freq: Four times a day (QID) | ORAL | Status: DC | PRN
  Administered 2021-11-07 – 2021-11-08 (×3): 10 mg via ORAL
  Filled 2021-11-07 (×3): qty 2

## 2021-11-07 MED ORDER — OXYCODONE HCL 5 MG PO TABS
5.0000 mg | ORAL_TABLET | Freq: Four times a day (QID) | ORAL | Status: DC | PRN
  Administered 2021-11-07: 5 mg via ORAL
  Filled 2021-11-07: qty 1

## 2021-11-07 MED ORDER — INSULIN GLARGINE-YFGN 100 UNIT/ML ~~LOC~~ SOLN
26.0000 [IU] | Freq: Two times a day (BID) | SUBCUTANEOUS | Status: DC
  Administered 2021-11-07 – 2021-11-08 (×2): 26 [IU] via SUBCUTANEOUS
  Filled 2021-11-07 (×3): qty 0.26

## 2021-11-07 NOTE — Progress Notes (Signed)
PHARMACIST - PHYSICIAN COMMUNICATION ? ?DR:   Darrick Meigs ? ?CONCERNING: IV to Oral Route Change Policy ? ?RECOMMENDATION: ?This patient is receiving protonix by the intravenous route.  Based on criteria approved by the Pharmacy and Therapeutics Committee, the intravenous medication(s) is/are being converted to the equivalent oral dose form(s). ? ? ?DESCRIPTION: ?These criteria include: ?The patient is eating (either orally or via tube) and/or has been taking other orally administered medications for a least 24 hours ?The patient has no evidence of active gastrointestinal bleeding or impaired GI absorption (gastrectomy, short bowel, patient on TNA or NPO). ? ?If you have questions about this conversion, please contact the Pharmacy Department  ?[]   [phone] )  [facility] ?[]   [phone] )  [facility] ?[]   [phone] )  [facility] ?[]   [phone] )  [facility] ?[x]   [phone] )  [facility]  ? ?Peggyann Juba, PharmD, BCPS ?11/07/2021 7:25 AM ?  ?
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
## 2021-11-07 NOTE — Progress Notes (Signed)
[facility] ? ?PROGRESS NOTE ? ?[name] [name]. ZOX:[id number] DOB: 11/28/2000 DOA: 11/04/2021 ?PCP: Patient, No Pcp Per (Inactive) ? ? ?Brief HPI:   ?21 year old male with past medical history of diabetes mellitus type 1, asthma, depression, seizure disorder, schizoaffective disorder presented with DKA.  Patient has been noncompliant with medications.  Also history of methadone abuse. ? ? ? ?Subjective  ? ?Patient seen and examined, no chest pain or shortness of breath. ? ? Assessment/Plan:  ? ? ?DKA ; diabetes mellitus type 1 ?-Patient presented with DKA in the setting of noncompliance with medication ?-Had anion gap metabolic acidosis with gap of 30, bicarb was 9, CBG was elevated 621 ?-Patient was admitted to stepdown unit ?-UA was negative, chest x-ray was negative for pneumonia ?-Anion gap was closed and IV insulin was discontinued  ?-Patient was started on start sliding scale insulin with NovoLog ?-Semglee 24 units subcu twice daily; will increase Semglee to 26 units subcu twice daily ?-Continue NovoLog 5 units 3 times daily meal coverage ? ?Hyponatremia ?-Resolved ? ?Transaminitis/hyperbilirubinemia/hepatitis C ?-Abdominal ultrasound is unremarkable ?-HCV RNA : 3,310,000  ?-Follow-up gastroenterology as outpatient;  ?-discussed with [facility] Dr. Kerin Salen.  She will see patient in the clinic as outpatient ? 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 ?Acute kidney injury ?-Likely from dehydration, poor p.o. intake ?-Resolved ? ?Schizoaffective disorder ?-Continue Zoloft ?-Psychiatry consulted; no indication for inpatient psychiatric admission ?-Patient will be provided inpatient rehab facilities list so he can coordinate his own admission ? ?History of seizure ?-Patient was on Topamax in the past however he has not  taken this medication since 2017 ?-Will not restart this medication at this time ? ?Medications ? ?  ? Chlorhexidine Gluconate Cloth  6 each Topical Daily  ? enoxaparin (LOVENOX) injection  40 mg Subcutaneous Q24H  ? feeding supplement (GLUCERNA SHAKE)  237 mL Oral BID BM  ? insulin aspart  0-9 Units Subcutaneous TID WC  ? insulin aspart  5 Units Subcutaneous TID WC  ? insulin glargine-yfgn  24 Units Subcutaneous BID  ? mouth rinse  15 mL Mouth Rinse BID  ? multivitamin with minerals  1 tablet Oral Daily  ? pantoprazole  40 mg Oral BID AC  ? sertraline  50 mg Oral Daily  ? tamsulosin  0.4 mg Oral Daily  ? ? ? Data Reviewed:  ? ?CBG: ? ?Recent Labs  ?Lab 11/06/21 ?1653 11/06/21 ?2046 11/07/21 ?0753 11/07/21 ?1127 11/07/21 ?1202  ?GLUCAP 392* 363* 286* 281* 274*  ? ? ?SpO2: 96 %  ? ? ?Vitals:  ? 11/06/21 1700 11/06/21 1858 11/06/21 2032 11/07/21 0316  ?BP:  (!) 142/103 (!) 135/99 (!) 143/95  ?Pulse:  91 92 73  ?Resp:  20 18 17   ?Temp: 98 ?F (36.7 ?C) 97.9 ?F (36.6 ?C) 97.9 ?F (36.6 ?C) 97.6 ?F (36.4 ?C)  ?TempSrc: Axillary     ?SpO2:  99% 99% 96%  ?Weight:      ?Height:      ? ? ? ? ?Data Reviewed: ? ?Basic Metabolic Panel: ?Recent Labs  ?Lab 11/05/21 ?1437 11/05/21 ?1826 11/05/21 ?2241 11/06/21 ?01/06/22 11/06/21 ?01/06/22  ?NA 135 137 138 136 138  ?K 3.7 3.6 3.5 3.3* 3.6  ?CL 102 105 105 101 102  ?CO2 20* 24 24 26 29   ?GLUCOSE 239* 203* 193* 160* 147*  ?BUN 7 7 7 9 9   ?  CREATININE 0.68 0.56* 0.59* 0.53* 0.42*  ?CALCIUM 8.2* 7.7* 7.8* 7.9* 8.1*  ? ? ?CBC: ?Recent Labs  ?Lab 11/04/21 ?1420 11/05/21 ?0727 11/05/21 ?0900  ?WBC 9.4 DUPLICATE REQUEST 6.6  ?HGB 15.2 DUPLICATE REQUEST 11.9*  ?HCT 44.7 DUPLICATE REQUEST 37.7*  ?MCV 86.3 DUPLICATE REQUEST 93.3  ?PLT 354 DUPLICATE REQUEST 261  ? ? ?LFT ?Recent Labs  ?Lab 11/04/21 ?1420  ?AST 250*  ?ALT 249*  ?ALKPHOS 153*  ?BILITOT 3.2*  ?PROT 7.6  ?ALBUMIN 3.8  ? ?  ?Antibiotics: ?Anti-infectives (From admission, onward)  ? ? None  ? ?  ? ? ? ?DVT prophylaxis: Lovenox ? ?Code Status:  Full code ? ?Family Communication: No family at bedside ? ? ?CONSULTS  ? ? ?Objective  ? ? ?Physical Examination: ? ? ?General-appears in no acute distress ?Heart-S1-S2, regular, no murmur auscultated ?Lungs-clear to auscultation bilaterally, no wheezing or crackles auscultated ?Abdomen-soft, nontender, no organomegaly ?Extremities-no edema in the lower extremities ?Neuro-alert, oriented x3, no focal deficit noted ? ? ?Status is: Inpatient: Urinary retention ? ? ? ?  ? ? ?Meredeth Ide ?  ?[facility] ?If 7PM-7AM, please contact night-coverage at [email or web address], ?Office  [phone] ? ? ?11/07/2021, 1:52 PM  LOS: 2 days  ? ? ? ? ? ? ? ? ? ? ?  ?

## 2021-11-07 NOTE — Plan of Care (Signed)

## 2021-11-07 NOTE — TOC Progression Note (Signed)
Transition of Care (TOC) - Progression Note  ? ?Patient Details  ?Name: [name] [name]. ?MRN: [id number] ?Date of Birth: 25-Dec-2000 ? ?Transition of Care (TOC) CM/SW Contact  ?Sherie Don, LCSW ?Phone Number: ?11/07/2021, 3:11 PM ? ?Clinical Narrative: CSW spoke with Benjamine Mola with [facility] in [facility] to follow up with the referral that was sent yesterday. Per Benjamine Mola, CSW would need to speak with Butch Penny or Sharyn Lull regarding admission but reported the patient may not be considered a [employer] resident as he is homeless in [place] with his last physical address being in [place]. CSW left a VM for Donna/Michelle requesting call back regarding the referral and patient's eligibility. CSW updated patient. ? ?Expected Discharge Plan: [place] ?Barriers to Discharge: Continued Medical Work up ? ?Expected Discharge Plan and Services ?Expected Discharge Plan: [place] ?Living arrangements for the past 2 months: Homeless ? ?Readmission Risk Interventions ? ?  11/07/2021  ?  3:11 PM  ?Readmission Risk Prevention Plan  ?Medication Review Press photographer) Complete  ?[place] or Home Care Consult Complete  ?SW Recovery Care/Counseling Consult Complete  ?Palliative Care Screening Not Applicable  ?[place] Not Applicable  ? ?

## 2021-11-08 ENCOUNTER — Other Ambulatory Visit (HOSPITAL_COMMUNITY): Payer: Self-pay

## 2021-11-08 DIAGNOSIS — N179 Acute kidney failure, unspecified: Secondary | ICD-10-CM | POA: Diagnosis not present

## 2021-11-08 DIAGNOSIS — E8729 Other acidosis: Secondary | ICD-10-CM | POA: Diagnosis not present

## 2021-11-08 DIAGNOSIS — E1010 Type 1 diabetes mellitus with ketoacidosis without coma: Secondary | ICD-10-CM | POA: Diagnosis not present

## 2021-11-08 LAB — GLUCOSE, CAPILLARY
Glucose-Capillary: 276 mg/dL — ABNORMAL HIGH (ref 70–99)
Glucose-Capillary: 366 mg/dL — ABNORMAL HIGH (ref 70–99)
Glucose-Capillary: 370 mg/dL — ABNORMAL HIGH (ref 70–99)

## 2021-11-08 MED ORDER — OXYCODONE HCL 5 MG PO TABS: 5.0000 mg | ORAL_TABLET | Freq: Four times a day (QID) | ORAL | 0 refills | Status: DC | PRN

## 2021-11-08 MED ORDER — LISINOPRIL 5 MG PO TABS: 5.0000 mg | ORAL_TABLET | Freq: Every day | ORAL | 2 refills | Status: DC

## 2021-11-08 MED ORDER — INSULIN ASPART 100 UNIT/ML IJ SOLN
5.0000 [IU] | Freq: Once | INTRAMUSCULAR | Status: AC
  Administered 2021-11-08: 5 [IU] via SUBCUTANEOUS

## 2021-11-08 MED ORDER — TAMSULOSIN HCL 0.4 MG PO CAPS: 0.4000 mg | ORAL_CAPSULE | Freq: Every day | ORAL | 2 refills | Status: DC

## 2021-11-08 MED ORDER — INSULIN GLARGINE 100 UNIT/ML SOLOSTAR PEN: 40.0000 [IU] | PEN_INJECTOR | Freq: Two times a day (BID) | SUBCUTANEOUS | 2 refills | Status: DC

## 2021-11-08 MED ORDER — NOVOLOG FLEXPEN 100 UNIT/ML ~~LOC~~ SOPN: 1.0000 [IU] | PEN_INJECTOR | Freq: Three times a day (TID) | SUBCUTANEOUS | 6 refills | Status: DC

## 2021-11-08 NOTE — Discharge Summary (Signed)
?Physician Discharge Summary ?  ?Patient: [name] [name]. MRN: [id number] DOB: [birth year]  ?Admit date:     11/04/2021  ?Discharge date: 11/08/21  ?Discharge Physician: Oswald Hillock  ? ?PCP: Patient, No Pcp Per (Inactive)  ? ?Recommendations at discharge:  ? ?Follow-up PCP in 2 weeks ?Follow-up outpatient psychiatry in 1 week ?Follow-up urology in 2 weeks ? ?Discharge Diagnoses: ?Principal Problem: ?  DKA, type 1 ([place]) ?Active Problems: ?  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  Chest pain ?  Abnormal transaminases ?  Methamphetamine use disorder, moderate ([place]) ?  Type I diabetes mellitus ([place]) ?  ADD (attention deficit disorder) ?  High anion gap metabolic acidosis ?  AKI (acute kidney injury) ([place]) ? ?Resolved Problems: ?  * No resolved hospital problems. * ? ?Hospital Course: ?21 year old male with past medical history of diabetes mellitus type 1, asthma, depression, seizure disorder, schizoaffective disorder presented with DKA.  Patient has been noncompliant with medications.  Also history of methadone abuse. ? ?Assessment and Plan: ? ?DKA ; diabetes mellitus type 1 ?-Patient presented with DKA in the setting of noncompliance with medication ?-Had anion gap metabolic acidosis with gap of 30, bicarb was 9, CBG was elevated 621 ?-Patient was admitted to stepdown unit ?-UA was negative, chest x-ray was negative for pneumonia ?-Anion gap was closed and IV insulin was discontinued  ?-Patient was started on start sliding scale insulin with NovoLog ?-Semglee 24 units subcu twice daily; will increase Semglee to 26 units subcu twice daily ?-Continue NovoLog 5 units 3 times daily meal coverage ?-Patient will be discharged on home regimen of Lantus and sliding scale insulin with NovoLog  ? ?Abdominal pain ?-Secondary to DKA ?-Pain has improved, still has abdominal pain ?-We will discharge on oxycodone 5 mg every 6 hours as needed for total 15 tablets ? ?Hyponatremia ?-Resolved ?  ?Transaminitis/hyperbilirubinemia/hepatitis  C ?-Abdominal ultrasound is unremarkable ?-HCV RNA : 3,310,000  ?-Follow-up gastroenterology as outpatient;  ?-discussed with [facility] Dr. Ronnette Juniper.  She will see patient in the clinic as outpatient ?-Patient told me that he has appointment set up with [facility] on 18th of this month ?-He will follow-up with [facility] as outpatient ?  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 Gloriann Loan, he says that patient can go without catheterization and but will follow-up with Dr. Gloriann Loan as outpatient ?  ?Acute kidney injury ?-Likely from dehydration, poor p.o. intake ?-Resolved ?  ?Schizoaffective disorder ?-Continue Zoloft, Tegretol, Seroquel, Haldol, chlorpromazine ?-Psychiatry consulted; no indication for inpatient psychiatric admission ?-Patient will be provided inpatient rehab facilities list so he can coordinate his own admission ?-Was told by clinical social worker that patient may not be eligible for inpatient rehab as he is on insulin ?-We will give him resources for outpatient rehab ?  ?History of seizure ?-Patient was on Topamax in the past however he has not taken this medication since 2017 ?-Will not restart this medication at this time ?  ? ? ?  ? ? ?Consultants:  ?Procedures performed:  ?Disposition: Home ?Diet recommendation:  ?Discharge Diet Orders (From admission, onward)  ? ?  Start     Ordered  ? 11/08/21 0000  Diet - low sodium heart healthy       ? 11/08/21 1331  ? ?  ?  ? ?  ? ?  Carb modified diet ?DISCHARGE MEDICATION: ?Allergies as of 11/08/2021   ? ?   Reactions  ? Bee Venom Anaphylaxis  ? Fish Allergy Anaphylaxis  ? Shellfish Allergy Anaphylaxis  ? Tea Anaphylaxis  ? ?  ? ?  ?Medication List  ?  ? ?TAKE these  medications   ? ?Advocate Insulin Pen Needles 29G X 12.7MM Misc ?Generic drug: Insulin Pen Needle ?For type I diabetes ?  ?albuterol 108 (90 Base) MCG/ACT inhaler ?Commonly known as: VENTOLIN HFA ?Inhale 2 puffs into the lungs every 6 (six) hours as needed for wheezing or shortness of breath. ?  ?ARIPiprazole 5 MG tablet ?Commonly known as: ABILIFY ?Take 5 mg by mouth daily. ?  ?blood glucose meter kit and supplies Kit ?Dispense based on patient and insurance preference. Use up to four times daily as directed. (FOR ICD-9 250.00, 250.01). ?  ?carbamazepine 200 MG tablet ?Commonly known as: TEGRETOL ?Take 1 tablet (200 mg total) by mouth every 12 (twelve) hours. ?  ?chlorproMAZINE 25 MG tablet ?Commonly known as: THORAZINE ?Take 1 tablet (25 mg total) by mouth at bedtime. ?  ?chlorproMAZINE 10 MG tablet ?Commonly known as: THORAZINE ?Take 1 tablet (10 mg total) by mouth 2 (two) times daily. ?  ?cyanocobalamin 1000 MCG tablet ?Take 1,000 mcg by mouth daily. ?  ?Digestive Support Caps ?Take 1 capsule by mouth daily. ?  ?EPINEPHrine 0.3 mg/0.3 mL Soaj injection ?Commonly known as: EPI-PEN ?Inject 0.3 mg into the muscle once as needed for anaphylaxis (severe allergic reaction). ?  ?FREESTYLE TEST STRIPS test strip ?Generic drug: glucose blood ?Use as instructed ?  ?guanFACINE 1 MG Tb24 ER tablet ?Commonly known as: INTUNIV ?Take 1 mg by mouth daily. ?  ?haloperidol 5 MG tablet ?Commonly known as: HALDOL ?Take 5 mg by mouth 2 (two) times daily. ?  ?insulin glargine 100 UNIT/ML Solostar Pen ?Commonly known as: LANTUS ?Inject 40 Units into the skin in the morning and at bedtime. ?  ?lisinopril 5 MG tablet ?Commonly known as: ZESTRIL ?Take 1 tablet (5 mg total) by mouth daily. ?  ?nicotine 14 mg/24hr patch ?Commonly known as: NICODERM CQ - dosed in mg/24 hours ?Place 1 patch (14 mg total) onto the skin daily. ?  ?NovoLOG FlexPen 100 UNIT/ML FlexPen ?Generic drug: insulin aspart ?Inject 1-15 Units into the skin 3 (three) times  daily with meals. 1 unit/g of carbohydrate sliding scale. ?What changed:  ?how much to take ?additional instructions ?  ?ondansetron 4 MG tablet ?Commonly known as: ZOFRAN ?Take 4 mg by mouth every 8 (eight) hours as needed for nausea/vomiting. ?  ?oxyCODONE 5 MG immediate release tablet ?Commonly known as: Oxy IR/ROXICODONE ?Take 1 tablet (5 mg total) by mouth every 6 (six) hours as needed for severe pain. ?  ?pantoprazole 40 MG tablet ?Commonly known as: PROTONIX ?Take 40 mg by mouth 2 (two) times daily. ?  ?QUEtiapine 50 MG tablet ?Commonly known as: SEROQUEL ?Take 50 mg by mouth at bedtime. ?  ?sertraline 50 MG tablet ?Commonly known as: ZOLOFT ?Take 50 mg by mouth daily. ?  ?tamsulosin 0.4 MG Caps capsule ?Commonly known as: FLOMAX ?Take 1 capsule (0.4 mg total) by mouth daily. ?Start taking on: Nov 09, 2021 ?  ? ?  ? ? Follow-up Information   ? ? [facility] Follow up.   ?Why: You will need to go to the [facility] to establish this as your address so that you have a [employer]. This facility can assist with some resources  and guidance. This facility is closed Saturdays and Sundays. ?Contact information: ?[address] ?[place], [place] [address] ?[phone] ? ?  ?  ? ? Lucas Mallow, MD Follow up in 2 week(s).   ?Specialty: Urology ?Contact information: ?[place] ?[place] [address] ?[phone] ? ? ?  ?  ? ?  ?  ? ?  ? ?Discharge Exam: ?Filed Weights  ? 11/04/21 1659  ?Weight: 70.3 kg  ? ?General-appears in no acute distress ?Heart-S1-S2, regular, no murmur auscultated ?Lungs-clear to auscultation bilaterally, no wheezing or crackles auscultated ?Abdomen-soft, nontender, no organomegaly ?Extremities-no edema in the lower extremities ?Neuro-alert, oriented x3, no focal deficit noted ? ?Condition at discharge: good ? ?The results of significant diagnostics from this hospitalization (including imaging, microbiology, ancillary and laboratory) are listed below for  reference.  ? ?Imaging Studies: ?CT ABDOMEN PELVIS WO CONTRAST ? ?Result Date: 11/04/2021 ?CLINICAL DATA:  Abdominal pain, nausea, vomiting EXAM: CT ABDOMEN AND PELVIS WITHOUT CONTRAST TECHNIQUE: Multidetector CT imagi

## 2021-11-08 NOTE — TOC Progression Note (Addendum)
Transition of Care (TOC) - Progression Note  ? ?Patient Details  ?Name: [name] [name]. ?MRN: [id number] ?Date of Birth: 10/17/2000 ? ?Transition of Care (TOC) CM/SW Contact  ?Ewing Schlein, LCSW ?Phone Number: ?11/08/2021, 12:42 PM ? ?Clinical Narrative: CSW spoke with Marcelino Duster in admissions at [facility] residential program. Per Marcelino Duster, the program cannot take the patient on any medications he receives via injections, including those he takes for type 1 diabetes, as the program is not "a medical facility." The program also cannot accept the patient with a catheter. The facility is willing to review the referral and requested that it be sent again, but the patient will need to get connected with the [facility] so he can have a [employer]. Patient's Medicaid is for [facility] and can be used to pay for treatment, but he will still require a physical address prior to admission. Referral was faxed again to [facility] [phone]). ? ?CSW updated hospitalist that due to lack of physical address and patient not being on solely oral medications, he is not currently eligible for [facility] residential program. Information for [facility] and substance use resources added to patient's AVS. ? ?Expected Discharge Plan: Homeless Shelter ?Barriers to Discharge: Continued Medical Work up ? ?Expected Discharge Plan and Services ?Expected Discharge Plan: Homeless Shelter ?Living arrangements for the past 2 months: Homeless ? ?Readmission Risk Interventions ? ?  11/07/2021  ?  3:11 PM  ?Readmission Risk Prevention Plan  ?Medication Review Oceanographer) Complete  ?[place] or Home Care Consult Complete  ?SW Recovery Care/Counseling Consult Complete  ?Palliative Care Screening Not Applicable  ?Skilled Nursing Facility Not Applicable  ? ?

## 2021-11-08 NOTE — Progress Notes (Addendum)
Pt discharged home. PIV removed. AVS printed and educational teaching completed with teach back method. Pt stated that, "I'm going home with my wife and kids". Pt was educated on diabetes education and diabetic diet. Pt was educated on follow up appointments. Pt was given outpatient rehab information (provided in AVS). Pt verbalized understanding educational teaching. Pt has all belongings. Pt was escorted outside to pt wife car.  ?

## 2021-11-08 NOTE — Progress Notes (Signed)
CBG retaken, per MD request. CBG 370 @ 1349. ?

## 2021-11-08 NOTE — Progress Notes (Signed)
Inpatient Diabetes Program Recommendations ? ?AACE/ADA: New Consensus Statement on Inpatient Glycemic Control (2015) ? ?Target Ranges:  Prepandial:   less than 140 mg/dL ?     Peak postprandial:   less than 180 mg/dL (1-2 hours) ?     Critically ill patients:  140 - 180 mg/dL  ? ?Lab Results  ?Component Value Date  ? GLUCAP 276 (H) 11/08/2021  ? HGBA1C 8.8 (H) 08/13/2021  ? ? ?Review of Glycemic Control ? Latest Reference Range & Units 11/07/21 07:53 11/07/21 11:27 11/07/21 12:02 11/07/21 16:53 11/07/21 20:43 11/08/21 07:46  ?Glucose-Capillary 70 - 99 mg/dL 485 (H) 462 (H) 703 (H) 319 (H) 201 (H) 276 (H)  ?(H): Data is abnormally high ? ?Diabetes history: DM1 ?Outpatient Diabetes medications: Novolog 0-14 units TID, Lantus 30 units BID ?Current orders for Inpatient glycemic control: Semglee 26 units BID, Novolog 0-9 units tid, Novolog 5 units TID with meals ? ?Inpatient Diabetes Program Recommendations:   ? ?-Semglee 32 units BID ?-Novolog 8 units TID with meals if eats at least 50% ? ?Will continue to follow while inpatient. ? ?Thank you, ?Dulce Sellar, MSN, CDCES ?Diabetes Coordinator ?Inpatient Diabetes Program ?[phone] (team pager from 8a-5p) ? ? ? ?

## 2021-11-10 ENCOUNTER — Ambulatory Visit (HOSPITAL_COMMUNITY)
Admission: EM | Admit: 2021-11-10 | Discharge: 2021-11-11 | Disposition: A | Discharge status: Home / Self Care | Payer: Medicaid Other | Source: Home / Self Care

## 2021-11-10 DIAGNOSIS — M79622 Pain in left upper arm: Secondary | ICD-10-CM | POA: Insufficient documentation

## 2021-11-10 DIAGNOSIS — F1910 Other psychoactive substance abuse, uncomplicated: Secondary | ICD-10-CM | POA: Insufficient documentation

## 2021-11-10 DIAGNOSIS — R443 Hallucinations, unspecified: Secondary | ICD-10-CM | POA: Insufficient documentation

## 2021-11-10 DIAGNOSIS — R45851 Suicidal ideations: Secondary | ICD-10-CM | POA: Insufficient documentation

## 2021-11-10 DIAGNOSIS — M25512 Pain in left shoulder: Secondary | ICD-10-CM | POA: Insufficient documentation

## 2021-11-10 DIAGNOSIS — F1594 Other stimulant use, unspecified with stimulant-induced mood disorder: Secondary | ICD-10-CM

## 2021-11-10 DIAGNOSIS — F32A Depression, unspecified: Secondary | ICD-10-CM | POA: Insufficient documentation

## 2021-11-10 DIAGNOSIS — R079 Chest pain, unspecified: Secondary | ICD-10-CM | POA: Insufficient documentation
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
## 2021-11-11 ENCOUNTER — Encounter (HOSPITAL_COMMUNITY): Payer: Self-pay | Admitting: Internal Medicine

## 2021-11-11 ENCOUNTER — Emergency Department (HOSPITAL_COMMUNITY): Payer: Medicaid Other

## 2021-11-11 ENCOUNTER — Inpatient Hospital Stay (HOSPITAL_COMMUNITY)
Admission: EM | Admit: 2021-11-11 | Discharge: 2021-11-13 | DRG: 638 | Disposition: A | Discharge status: Home / Self Care | Payer: Medicaid Other | Attending: Internal Medicine | Admitting: Internal Medicine

## 2021-11-11 ENCOUNTER — Other Ambulatory Visit: Payer: Self-pay

## 2021-11-11 DIAGNOSIS — G40309 Generalized idiopathic epilepsy and epileptic syndromes, not intractable, without status epilepticus: Secondary | ICD-10-CM | POA: Diagnosis present

## 2021-11-11 DIAGNOSIS — B182 Chronic viral hepatitis C: Secondary | ICD-10-CM | POA: Diagnosis not present

## 2021-11-11 DIAGNOSIS — K56609 Unspecified intestinal obstruction, unspecified as to partial versus complete obstruction: Secondary | ICD-10-CM

## 2021-11-11 DIAGNOSIS — E1010 Type 1 diabetes mellitus with ketoacidosis without coma: Secondary | ICD-10-CM | POA: Diagnosis present

## 2021-11-11 DIAGNOSIS — Z833 Family history of diabetes mellitus: Secondary | ICD-10-CM

## 2021-11-11 DIAGNOSIS — Z794 Long term (current) use of insulin: Secondary | ICD-10-CM

## 2021-11-11 DIAGNOSIS — G40409 Other generalized epilepsy and epileptic syndromes, not intractable, without status epilepticus: Secondary | ICD-10-CM | POA: Diagnosis present

## 2021-11-11 DIAGNOSIS — Z825 Family history of asthma and other chronic lower respiratory diseases: Secondary | ICD-10-CM

## 2021-11-11 DIAGNOSIS — Z560 Unemployment, unspecified: Secondary | ICD-10-CM

## 2021-11-11 DIAGNOSIS — Z9151 Personal history of suicidal behavior: Secondary | ICD-10-CM

## 2021-11-11 DIAGNOSIS — R739 Hyperglycemia, unspecified: Secondary | ICD-10-CM

## 2021-11-11 DIAGNOSIS — K56600 Partial intestinal obstruction, unspecified as to cause: Secondary | ICD-10-CM | POA: Diagnosis not present

## 2021-11-11 DIAGNOSIS — Z83438 Family history of other disorder of lipoprotein metabolism and other lipidemia: Secondary | ICD-10-CM

## 2021-11-11 DIAGNOSIS — F250 Schizoaffective disorder, bipolar type: Secondary | ICD-10-CM | POA: Diagnosis present

## 2021-11-11 DIAGNOSIS — Z5901 Sheltered homelessness: Secondary | ICD-10-CM

## 2021-11-11 DIAGNOSIS — R45851 Suicidal ideations: Secondary | ICD-10-CM | POA: Diagnosis present

## 2021-11-11 DIAGNOSIS — F17210 Nicotine dependence, cigarettes, uncomplicated: Secondary | ICD-10-CM | POA: Diagnosis present

## 2021-11-11 DIAGNOSIS — F259 Schizoaffective disorder, unspecified: Secondary | ICD-10-CM | POA: Diagnosis present

## 2021-11-11 DIAGNOSIS — F1510 Other stimulant abuse, uncomplicated: Secondary | ICD-10-CM | POA: Diagnosis present

## 2021-11-11 DIAGNOSIS — Z8249 Family history of ischemic heart disease and other diseases of the circulatory system: Secondary | ICD-10-CM

## 2021-11-11 DIAGNOSIS — K759 Inflammatory liver disease, unspecified: Secondary | ICD-10-CM

## 2021-11-11 DIAGNOSIS — E1110 Type 2 diabetes mellitus with ketoacidosis without coma: Secondary | ICD-10-CM | POA: Diagnosis present

## 2021-11-11 DIAGNOSIS — F1520 Other stimulant dependence, uncomplicated: Secondary | ICD-10-CM | POA: Diagnosis present

## 2021-11-11 DIAGNOSIS — Z79899 Other long term (current) drug therapy: Secondary | ICD-10-CM

## 2021-11-11 DIAGNOSIS — D573 Sickle-cell trait: Secondary | ICD-10-CM | POA: Diagnosis present

## 2021-11-11 HISTORY — DX: Unspecified intestinal obstruction, unspecified as to partial versus complete obstruction: K56.609

## 2021-11-11 HISTORY — DX: Partial intestinal obstruction, unspecified as to cause: K56.600

## 2021-11-11 LAB — CBC WITH DIFFERENTIAL/PLATELET
Abs Immature Granulocytes: 0.09 10*3/uL — ABNORMAL HIGH (ref 0.00–0.07)
Basophils Absolute: 0.1 10*3/uL (ref 0.0–0.1)
Basophils Relative: 1 %
Eosinophils Absolute: 0.1 10*3/uL (ref 0.0–0.5)
Eosinophils Relative: 1 %
HCT: 39.2 % (ref 39.0–52.0)
Hemoglobin: 13.2 g/dL (ref 13.0–17.0)
Immature Granulocytes: 1 %
Lymphocytes Relative: 36 %
Lymphs Abs: 2.5 10*3/uL (ref 0.7–4.0)
MCH: 28.4 pg (ref 26.0–34.0)
MCHC: 33.7 g/dL (ref 30.0–36.0)
MCV: 84.3 fL (ref 80.0–100.0)
Monocytes Absolute: 0.7 10*3/uL (ref 0.1–1.0)
Monocytes Relative: 10 %
Neutro Abs: 3.5 10*3/uL (ref 1.7–7.7)
Neutrophils Relative %: 51 %
Platelets: 317 10*3/uL (ref 150–400)
RBC: 4.65 MIL/uL (ref 4.22–5.81)
RDW: 13.7 % (ref 11.5–15.5)
WBC: 7 10*3/uL (ref 4.0–10.5)
nRBC: 0 % (ref 0.0–0.2)

## 2021-11-11 LAB — BASIC METABOLIC PANEL
Anion gap: 13 (ref 5–15)
Anion gap: 10 (ref 5–15)
BUN: 9 mg/dL (ref 6–20)
BUN: 6 mg/dL (ref 6–20)
CO2: 22 mmol/L (ref 22–32)
CO2: 22 mmol/L (ref 22–32)
Calcium: 8.8 mg/dL — ABNORMAL LOW (ref 8.9–10.3)
Calcium: 8.5 mg/dL — ABNORMAL LOW (ref 8.9–10.3)
Chloride: 95 mmol/L — ABNORMAL LOW (ref 98–111)
Chloride: 102 mmol/L (ref 98–111)
Creatinine, Ser: 0.8 mg/dL (ref 0.61–1.24)
Creatinine, Ser: 0.68 mg/dL (ref 0.61–1.24)
GFR, Estimated: >60 mL/min (ref >60)
GFR, Estimated: >60 mL/min (ref >60)
Glucose, Bld: 391 mg/dL — ABNORMAL HIGH (ref 70–99)
Glucose, Bld: 101 mg/dL — ABNORMAL HIGH (ref 70–99)
Potassium: 4.2 mmol/L (ref 3.5–5.1)
Potassium: 3.3 mmol/L — ABNORMAL LOW (ref 3.5–5.1)
Sodium: 130 mmol/L — ABNORMAL LOW (ref 135–145)
Sodium: 134 mmol/L — ABNORMAL LOW (ref 135–145)

## 2021-11-11 LAB — COMPREHENSIVE METABOLIC PANEL
ALT: 336 U/L — ABNORMAL HIGH (ref 0–44)
AST: 196 U/L — ABNORMAL HIGH (ref 15–41)
Albumin: 3.7 g/dL (ref 3.5–5.0)
Alkaline Phosphatase: 148 U/L — ABNORMAL HIGH (ref 38–126)
Anion gap: 16 — ABNORMAL HIGH (ref 5–15)
BUN: 11 mg/dL (ref 6–20)
CO2: 20 mmol/L — ABNORMAL LOW (ref 22–32)
Calcium: 9.2 mg/dL (ref 8.9–10.3)
Chloride: 93 mmol/L — ABNORMAL LOW (ref 98–111)
Creatinine, Ser: 0.94 mg/dL (ref 0.61–1.24)
GFR, Estimated: >60 mL/min (ref >60)
Glucose, Bld: 530 mg/dL (ref 70–99)
Potassium: 4.1 mmol/L (ref 3.5–5.1)
Sodium: 129 mmol/L — ABNORMAL LOW (ref 135–145)
Total Bilirubin: 1.6 mg/dL — ABNORMAL HIGH (ref 0.3–1.2)
Total Protein: 7.5 g/dL (ref 6.5–8.1)

## 2021-11-11 LAB — URINALYSIS, ROUTINE W REFLEX MICROSCOPIC
Bacteria, UA: NONE SEEN
Bilirubin Urine: NEGATIVE
Glucose, UA: >=500 mg/dL — AB
Hgb urine dipstick: NEGATIVE
Ketones, ur: 20 mg/dL — AB
Leukocytes,Ua: NEGATIVE
Nitrite: NEGATIVE
Protein, ur: NEGATIVE mg/dL
Specific Gravity, Urine: 1.023 (ref 1.005–1.030)
pH: 6 (ref 5.0–8.0)

## 2021-11-11 LAB — I-STAT VENOUS BLOOD GAS, ED
Acid-Base Excess: 1 mmol/L (ref 0.0–2.0)
Acid-Base Excess: 1 mmol/L (ref 0.0–2.0)
Bicarbonate: 23.5 mmol/L (ref 20.0–28.0)
Bicarbonate: 25.6 mmol/L (ref 20.0–28.0)
Calcium, Ion: 1.1 mmol/L — ABNORMAL LOW (ref 1.15–1.40)
Calcium, Ion: 1.16 mmol/L (ref 1.15–1.40)
HCT: 44 % (ref 39.0–52.0)
HCT: 41 % (ref 39.0–52.0)
Hemoglobin: 15 g/dL (ref 13.0–17.0)
Hemoglobin: 13.9 g/dL (ref 13.0–17.0)
O2 Saturation: 100 %
O2 Saturation: 99 %
Potassium: 4.3 mmol/L (ref 3.5–5.1)
Potassium: 4.2 mmol/L (ref 3.5–5.1)
Sodium: 128 mmol/L — ABNORMAL LOW (ref 135–145)
Sodium: 131 mmol/L — ABNORMAL LOW (ref 135–145)
TCO2: 24 mmol/L (ref 22–32)
TCO2: 27 mmol/L (ref 22–32)
pCO2, Ven: 31.4 mmHg — ABNORMAL LOW (ref 44–60)
pCO2, Ven: 39.5 mmHg — ABNORMAL LOW (ref 44–60)
pH, Ven: 7.482 — ABNORMAL HIGH (ref 7.25–7.43)
pH, Ven: 7.419 (ref 7.25–7.43)
pO2, Ven: 215 mmHg — ABNORMAL HIGH (ref 32–45)
pO2, Ven: 137 mmHg — ABNORMAL HIGH (ref 32–45)

## 2021-11-11 LAB — CBG MONITORING, ED
Glucose-Capillary: 547 mg/dL (ref 70–99)
Glucose-Capillary: 456 mg/dL — ABNORMAL HIGH (ref 70–99)
Glucose-Capillary: 357 mg/dL — ABNORMAL HIGH (ref 70–99)
Glucose-Capillary: 264 mg/dL — ABNORMAL HIGH (ref 70–99)
Glucose-Capillary: 114 mg/dL — ABNORMAL HIGH (ref 70–99)
Glucose-Capillary: 175 mg/dL — ABNORMAL HIGH (ref 70–99)
Glucose-Capillary: 262 mg/dL — ABNORMAL HIGH (ref 70–99)
Glucose-Capillary: 221 mg/dL — ABNORMAL HIGH (ref 70–99)
Glucose-Capillary: 209 mg/dL — ABNORMAL HIGH (ref 70–99)

## 2021-11-11 LAB — CBC
HCT: 40.7 % (ref 39.0–52.0)
Hemoglobin: 13.8 g/dL (ref 13.0–17.0)
MCH: 28.5 pg (ref 26.0–34.0)
MCHC: 33.9 g/dL (ref 30.0–36.0)
MCV: 83.9 fL (ref 80.0–100.0)
Platelets: 345 10*3/uL (ref 150–400)
RBC: 4.85 MIL/uL (ref 4.22–5.81)
RDW: 13.6 % (ref 11.5–15.5)
WBC: 7.4 10*3/uL (ref 4.0–10.5)
nRBC: 0 % (ref 0.0–0.2)

## 2021-11-11 LAB — GLUCOSE, CAPILLARY
Glucose-Capillary: 422 mg/dL — ABNORMAL HIGH (ref 70–99)
Glucose-Capillary: >600 mg/dL (ref 70–99)

## 2021-11-11 LAB — BETA-HYDROXYBUTYRIC ACID: Beta-Hydroxybutyric Acid: 1.51 mmol/L — ABNORMAL HIGH (ref 0.05–0.27)

## 2021-11-11 LAB — TROPONIN I (HIGH SENSITIVITY)
Troponin I (High Sensitivity): 3 ng/L (ref <18)
Troponin I (High Sensitivity): 3 ng/L (ref <18)

## 2021-11-11 MED ORDER — INSULIN ASPART 100 UNIT/ML IJ SOLN: 0.0000 [IU] | Freq: Every day | INTRAMUSCULAR | Status: DC

## 2021-11-11 MED ORDER — DEXTROSE 50 % IV SOLN: 0.0000 mL | INTRAVENOUS | Status: DC | PRN

## 2021-11-11 MED ORDER — INSULIN GLARGINE-YFGN 100 UNIT/ML ~~LOC~~ SOLN
40.0000 [IU] | Freq: Two times a day (BID) | SUBCUTANEOUS | Status: DC
  Administered 2021-11-11 – 2021-11-12 (×2): 40 [IU] via SUBCUTANEOUS
  Filled 2021-11-11 (×5): qty 0.4

## 2021-11-11 MED ORDER — HALOPERIDOL 5 MG PO TABS
5.0000 mg | ORAL_TABLET | Freq: Two times a day (BID) | ORAL | Status: DC
  Administered 2021-11-11 – 2021-11-13 (×4): 5 mg via ORAL
  Filled 2021-11-11 (×5): qty 1

## 2021-11-11 MED ORDER — IOHEXOL 300 MG/ML  SOLN
100.0000 mL | Freq: Once | INTRAMUSCULAR | Status: AC | PRN
  Administered 2021-11-11: 100 mL via INTRAVENOUS

## 2021-11-11 MED ORDER — CHLORPROMAZINE HCL 10 MG PO TABS
10.0000 mg | ORAL_TABLET | Freq: Two times a day (BID) | ORAL | Status: DC
Start: 2021-11-11 — End: 2021-11-13
  Administered 2021-11-11 – 2021-11-13 (×4): 10 mg via ORAL
  Filled 2021-11-11 (×5): qty 1

## 2021-11-11 MED ORDER — HALOPERIDOL 0.5 MG PO TABS: 0.5000 mg | ORAL_TABLET | Freq: Two times a day (BID) | ORAL | Status: DC

## 2021-11-11 MED ORDER — DEXTROSE IN LACTATED RINGERS 5 % IV SOLN: INTRAVENOUS | Status: DC

## 2021-11-11 MED ORDER — ACETAMINOPHEN 325 MG PO TABS: 325.0000 mg | ORAL_TABLET | Freq: Four times a day (QID) | ORAL | Status: DC | PRN

## 2021-11-11 MED ORDER — INSULIN ASPART 100 UNIT/ML IJ SOLN
6.0000 [IU] | Freq: Once | INTRAMUSCULAR | Status: AC
  Administered 2021-11-11: 6 [IU] via SUBCUTANEOUS

## 2021-11-11 MED ORDER — POTASSIUM CHLORIDE 10 MEQ/100ML IV SOLN
10.0000 meq | INTRAVENOUS | Status: AC
  Administered 2021-11-11 (×2): 10 meq via INTRAVENOUS
  Filled 2021-11-11 (×2): qty 100

## 2021-11-11 MED ORDER — KETOROLAC TROMETHAMINE 30 MG/ML IJ SOLN
30.0000 mg | Freq: Once | INTRAMUSCULAR | Status: AC
  Administered 2021-11-11: 30 mg via INTRAVENOUS
  Filled 2021-11-11: qty 1

## 2021-11-11 MED ORDER — ENOXAPARIN SODIUM 40 MG/0.4ML IJ SOSY
40.0000 mg | PREFILLED_SYRINGE | INTRAMUSCULAR | Status: DC
  Filled 2021-11-11 (×2): qty 0.4

## 2021-11-11 MED ORDER — INSULIN REGULAR(HUMAN) IN NACL 100-0.9 UT/100ML-% IV SOLN
INTRAVENOUS | Status: DC
  Administered 2021-11-11: 10 [IU]/h via INTRAVENOUS
  Filled 2021-11-11: qty 100

## 2021-11-11 MED ORDER — CHLORPROMAZINE HCL 25 MG PO TABS
25.0000 mg | ORAL_TABLET | Freq: Every day | ORAL | Status: DC
Start: 2021-11-11 — End: 2021-11-13
  Administered 2021-11-11 – 2021-11-12 (×2): 25 mg via ORAL
  Filled 2021-11-11 (×3): qty 1

## 2021-11-11 MED ORDER — HYDROMORPHONE HCL 1 MG/ML IJ SOLN
0.5000 mg | Freq: Once | INTRAMUSCULAR | Status: AC
  Administered 2021-11-11: 0.5 mg via INTRAVENOUS
  Filled 2021-11-11: qty 1

## 2021-11-11 MED ORDER — ONDANSETRON HCL 4 MG/2ML IJ SOLN
4.0000 mg | Freq: Once | INTRAMUSCULAR | Status: AC
  Administered 2021-11-11: 4 mg via INTRAVENOUS
  Filled 2021-11-11: qty 2

## 2021-11-11 MED ORDER — LACTATED RINGERS IV BOLUS
20.0000 mL/kg | Freq: Once | INTRAVENOUS | Status: AC
Start: 2021-11-11 — End: 2021-11-11
  Administered 2021-11-11: 1360 mL via INTRAVENOUS

## 2021-11-11 MED ORDER — KETOROLAC TROMETHAMINE 30 MG/ML IJ SOLN
30.0000 mg | Freq: Four times a day (QID) | INTRAMUSCULAR | Status: DC | PRN
  Administered 2021-11-11: 30 mg via INTRAVENOUS
  Filled 2021-11-11: qty 1

## 2021-11-11 MED ORDER — INSULIN GLARGINE-YFGN 100 UNIT/ML ~~LOC~~ SOLN
20.0000 [IU] | Freq: Once | SUBCUTANEOUS | Status: AC
  Administered 2021-11-11: 20 [IU] via SUBCUTANEOUS
  Filled 2021-11-11: qty 0.2

## 2021-11-11 MED ORDER — HALOPERIDOL 5 MG PO TABS
5.0000 mg | ORAL_TABLET | Freq: Once | ORAL | Status: AC
  Administered 2021-11-11: 5 mg via ORAL
  Filled 2021-11-11: qty 1

## 2021-11-11 MED ORDER — INSULIN ASPART 100 UNIT/ML IJ SOLN
0.0000 [IU] | Freq: Three times a day (TID) | INTRAMUSCULAR | Status: DC
  Administered 2021-11-11: 5 [IU] via SUBCUTANEOUS
  Administered 2021-11-11 – 2021-11-12 (×2): 8 [IU] via SUBCUTANEOUS

## 2021-11-11 MED ORDER — LORAZEPAM 2 MG/ML IJ SOLN: 1.0000 mg | Freq: Once | INTRAMUSCULAR | Status: DC

## 2021-11-11 MED ORDER — INSULIN ASPART 100 UNIT/ML IJ SOLN
3.0000 [IU] | Freq: Three times a day (TID) | INTRAMUSCULAR | Status: DC
  Administered 2021-11-11 – 2021-11-12 (×2): 3 [IU] via SUBCUTANEOUS

## 2021-11-11 MED ORDER — LACTATED RINGERS IV SOLN: INTRAVENOUS | Status: DC

## 2021-11-11 NOTE — ED Notes (Signed)
Pt transported to Xray. 

## 2021-11-11 NOTE — Assessment & Plan Note (Signed)
Pt states he is suicidal.  Was at [facility] before getting sent to ED for hyperglycemia. ?1. Psych consult put into epic for AM ?

## 2021-11-11 NOTE — Assessment & Plan Note (Addendum)
RLQ abd pain, no N/V, but apparently PSBO on CT scan. ?With that said, clinically I doubt PSBO: ? No abd pain currently ? Pt is repeatedly asking me about eating food (not typically what I see in SBO patients). ?1. NPO ?2. IVF ?3. Gen surg consult vs curbside in AM - do they think PSBO real? ?4. Zofran PRN nausea ?5. Trying to avoid narcotics if possible given his current and ongoing methamphetamine abuse. ?1. And his brother just died of heroin overdose last month ?2. Frequent history of leaving AMA per H+P last admit. ?3. And pain controlled currently (asking to eat food). ?

## 2021-11-11 NOTE — ED Notes (Signed)
Pt experiencing increased tremors, abdominal pain, and is diaphoretic. Pt requested water and pain meds. Pt given cup of water. [place] PA to come see pt ?

## 2021-11-11 NOTE — ED Notes (Signed)
Patient transported to CT 

## 2021-11-11 NOTE — ED Notes (Signed)
Alcario Drought MD paged regarding pts 10/10 abd pain ?

## 2021-11-11 NOTE — Assessment & Plan Note (Signed)
Ongoing, most recently used just before he went over to [facility] earlier this evening. ?

## 2021-11-11 NOTE — ED Notes (Signed)
CBG 547MG  ?

## 2021-11-11 NOTE — H&P (Signed)
?History and Physical  ? ? ?Patient: [name] [name]. XTK:[id number] DOB: 08-Jun-2001 ?DOA: 11/11/2021 ?DOS: the patient was seen and examined on 11/11/2021 ?PCP: Pcp, No  ?Patient coming from: Homeless ? ?Chief Complaint:  ?Chief Complaint  ?Patient presents with  ? Chest Pain  ? Hyperglycemia  ? ?HPI: [name]. is a 21 y.o. male with medical history significant of DM1, schizoaffective disorder, homelessness, seizure disorder but not clear that he is on any meds for this chronically. ? ?Pt has been depressed this past month: Brother died of heroin OD, then mother shot herself. ? ?Pt with recent psych admit at end April, followed by DKA admit to our service just recently 5/8-5/12. ? ?Pt used methamphetamine earlier this evening.  Then went over to [facility] saying he was suicidal.  Ethan sent him to [facility] after finding his BGL to be elevated. ? ?He also complains of abd pain, RLQ.  No N/V. ? ?To me he states he is hungry, abd pain resolved, and is asking about food. ? ?No h/o abd surgeries. ? ? ?Review of Systems: As mentioned in the history of present illness. All other systems reviewed and are negative. ?Past Medical History:  ?Diagnosis Date  ? ADD (attention deficit disorder)   ? Allergy   ? Anxiety   ? Asthma   ? Depression   ? Epileptic seizure ([place])   ? "both petit and grand mal; last sz ~ 2 wk ago" (06/17/2013)  ? DJMEQAST(419.6)   ? "2-3 times/wk usually" (06/17/2013)  ? Heart murmur   ? "heard a slight one earlier today" (06/17/2013)  ? Homeless   ? Migraine   ? "maybe once/month; it's severe" (06/17/2013)  ? ODD (oppositional defiant disorder)   ? Sickle cell trait ([place])   ? Type I diabetes mellitus ([place])   ? "that's what they're thinking now" (06/17/2013)  ? Vision abnormalities   ? "takes him longer to focus cause; from his sz" (06/17/2013)  ? ?Past Surgical History:  ?Procedure Laterality Date  ? CIRCUMCISION  2000  ? FINGER SURGERY Left 2001  ? "crushed pinky; had to repair it" (06/17/2013)   ? ?Social History:  reports that he has been smoking cigarettes. He has been smoking an average of .5 packs per day. He has never used smokeless tobacco. He reports current drug use. Drug: Methamphetamines. He reports that he does not drink alcohol. ? ?Allergies  ?Allergen Reactions  ? Bee Venom Anaphylaxis  ? Fish Allergy Anaphylaxis  ? Shellfish Allergy Anaphylaxis  ? Tea Anaphylaxis  ? ? ?Family History  ?Problem Relation Age of Onset  ? Asthma Mother   ? COPD Mother   ? Other Mother   ?     possible autoimmune, unclear  ? Migraines Sister   ?     Hemiplegic Migraines   ? Diabetes Maternal Grandmother   ? Heart disease Maternal Grandmother   ? Hypertension Maternal Grandmother   ? Mental illness Maternal Grandmother   ? Heart disease Maternal Grandfather   ? Hyperlipidemia Paternal Grandmother   ? Hyperlipidemia Paternal Grandfather   ? ? ?Prior to Admission medications   ?Medication Sig Start Date End Date Taking? Authorizing Provider  ?insulin aspart (NOVOLOG FLEXPEN) 100 UNIT/ML FlexPen Inject 1-15 Units into the skin 3 (three) times daily with meals. 1 unit/g of carbohydrate sliding scale. 11/08/21  Yes Oswald Hillock, MD  ?insulin glargine (LANTUS) 100 UNIT/ML Solostar Pen Inject 40 Units into the skin in the morning and at  bedtime. 11/08/21  Yes Oswald Hillock, MD  ?albuterol (VENTOLIN HFA) 108 (90 Base) MCG/ACT inhaler Inhale 2 puffs into the lungs every 6 (six) hours as needed for wheezing or shortness of breath. ?Patient not taking: Reported on 11/05/2021    [provider]  ?ARIPiprazole (ABILIFY) 5 MG tablet Take 5 mg by mouth daily. ?Patient not taking: Reported on 11/05/2021 09/15/21   [provider]  ?blood glucose meter kit and supplies KIT Dispense based on patient and insurance preference. Use up to four times daily as directed. (FOR ICD-9 250.00, 250.01). 11/09/20   Florencia Reasons, MD  ?carbamazepine (TEGRETOL) 200 MG tablet Take 1 tablet (200 mg total) by mouth every 12 (twelve)  hours. ?Patient not taking: Reported on 11/11/2021 08/15/21 12/07/21  Briant Cedar, MD  ?chlorproMAZINE (THORAZINE) 10 MG tablet Take 1 tablet (10 mg total) by mouth 2 (two) times daily. 08/15/21 11/05/21  Briant Cedar, MD  ?chlorproMAZINE (THORAZINE) 25 MG tablet Take 1 tablet (25 mg total) by mouth at bedtime. 08/15/21 11/05/21  Briant Cedar, MD  ?cyanocobalamin 1000 MCG tablet Take 1,000 mcg by mouth daily. 10/24/21 11/23/21  [provider]  ?Digestive Enzymes (DIGESTIVE SUPPORT) CAPS Take 1 capsule by mouth daily.    [provider]  ?EPINEPHrine 0.3 mg/0.3 mL IJ SOAJ injection Inject 0.3 mg into the muscle once as needed for anaphylaxis (severe allergic reaction). 05/02/20   [provider]  ?glucose blood (FREESTYLE TEST STRIPS) test strip Use as instructed 11/09/20   Florencia Reasons, MD  ?guanFACINE (INTUNIV) 1 MG TB24 ER tablet Take 1 mg by mouth daily. 10/24/21 11/23/21  [provider]  ?haloperidol (HALDOL) 5 MG tablet Take 5 mg by mouth 2 (two) times daily. 10/23/21 11/22/21  [provider]  ?Insulin Pen Needle (ADVOCATE INSULIN PEN NEEDLES) 29G X 12.7MM MISC For type I diabetes 11/09/20   Florencia Reasons, MD  ?lisinopril (ZESTRIL) 5 MG tablet Take 1 tablet (5 mg total) by mouth daily. 11/08/21 02/06/22  Oswald Hillock, MD  ?nicotine (NICODERM CQ - DOSED IN MG/24 HOURS) 14 mg/24hr patch Place 1 patch (14 mg total) onto the skin daily. ?Patient not taking: Reported on 11/05/2021 08/15/21   Briant Cedar, MD  ?ondansetron [facility]) 4 MG tablet Take 4 mg by mouth every 8 (eight) hours as needed for nausea/vomiting. 09/24/21   [provider]  ?oxyCODONE (OXY IR/ROXICODONE) 5 MG immediate release tablet Take 1 tablet (5 mg total) by mouth every 6 (six) hours as needed for severe pain. 11/08/21   Oswald Hillock, MD  ?pantoprazole (PROTONIX) 40 MG tablet Take 40 mg by mouth 2 (two) times daily.    [provider]  ?QUEtiapine (SEROQUEL) 25 MG tablet  Take by mouth. 10/12/21   [provider]  ?QUEtiapine (SEROQUEL) 50 MG tablet Take 50 mg by mouth at bedtime. 09/24/21   [provider]  ?tamsulosin (FLOMAX) 0.4 MG CAPS capsule Take 1 capsule (0.4 mg total) by mouth daily. 11/09/21   Oswald Hillock, MD  ? ? ?Physical Exam: ?Vitals:  ? 11/11/21 0115 11/11/21 0125 11/11/21 0331  ?BP: (!) 152/107  (!) 136/104  ?Pulse: 77  (!) 120  ?Resp: 16  (!) 24  ?Temp: 98.9 ?F (37.2 ?C)    ?SpO2: 98%  99%  ?Weight:  68 kg   ?Height:  '5\' 11"'  (1.803 m)   ? ?Constitutional: NAD, calm, comfortable ?Eyes: PERRL, lids and conjunctivae normal ?ENMT: Mucous membranes are moist. Posterior pharynx  clear of any exudate or lesions.Normal dentition.  ?Neck: normal, supple, no masses, no thyromegaly ?Respiratory: clear to auscultation bilaterally, no wheezing, no crackles. Normal respiratory effort. No accessory muscle use.  ?Cardiovascular: Regular rate and rhythm, no murmurs / rubs / gallops. No extremity edema. 2+ pedal pulses. No carotid bruits.  ?Abdomen: no tenderness, no masses palpated. No hepatosplenomegaly. Bowel sounds positive.  ?Musculoskeletal: no clubbing / cyanosis. No joint deformity upper and lower extremities. Good ROM, no contractures. Normal muscle tone.  ?Skin: no rashes, lesions, ulcers. No induration ?Neurologic: CN 2-12 grossly intact. Sensation intact, DTR normal. Strength 5/5 in all 4.  ?Psychiatric: Normal judgment and insight. Alert and oriented x 3. Normal mood.  ? ?Data Reviewed: ? ?  ?CMP  ?   ?Component Value Date/Time  ? NA 128 (L) 11/11/2021 0239  ? K 4.3 11/11/2021 0239  ? CL 93 (L) 11/11/2021 0145  ? CO2 20 (L) 11/11/2021 0145  ? GLUCOSE 530 (HH) 11/11/2021 0145  ? BUN 11 11/11/2021 0145  ? CREATININE 0.94 11/11/2021 0145  ? CREATININE 0.87 03/31/2016 0001  ? CALCIUM 9.2 11/11/2021 0145  ? PROT 7.5 11/11/2021 0145  ? ALBUMIN 3.7 11/11/2021 0145  ? AST 196 (H) 11/11/2021 0145  ? ALT 336 (H) 11/11/2021 0145  ? ALKPHOS 148 (H) 11/11/2021 0145  ?  BILITOT 1.6 (H) 11/11/2021 0145  ? GFRNONAA >60 11/11/2021 0145  ? GFRAA >60 02/18/2020 2109  ? ?CT AP = ? Of PSBO ? ?Assessment and Plan: ?* Partial small bowel obstruction ([place]) ?RLQ abd pain, no N/V, but apparently PSB

## 2021-11-11 NOTE — Progress Notes (Signed)
Inpatient Diabetes Program Recommendations ? ?AACE/ADA: New Consensus Statement on Inpatient Glycemic Control (2015) ? ?Target Ranges:  Prepandial:   less than 140 mg/dL ?     Peak postprandial:   less than 180 mg/dL (1-2 hours) ?     Critically ill patients:  140 - 180 mg/dL  ? ?Lab Results  ?Component Value Date  ? GLUCAP 175 (H) 11/11/2021  ? HGBA1C 8.8 (H) 08/13/2021  ? ? ?Review of Glycemic Control ? ?Diabetes history: DM1(does not make insulin.  Needs correction, basal and meal coverage) ?  ?Outpatient Diabetes medications: Novolog 0-14 units BID, Lantus 30 units BID ?  ?Current orders for Inpatient glycemic control: IV insulin ? ?Inpatient Diabetes Program Recommendations:   ?Patient well known to DM Coordinator team. ?-Semglee 32 units BID ?-Novolog 0-9 units q 4 hrs. While clear liquids then tid + hs 0-5 units ?-When eating well, Novolog 8 units TID with meals if eats at least 50% Patient appears to be very resistant to insulin. Has required more basal and meal coverage in past hospitalizations.    ? ?Thank you, ?[name] [name] Jairo Bellew, RN, MSN, CDE  ?Diabetes Coordinator ?Inpatient Glycemic Control Team ?Team Pager [phone] (8am-5pm) ?11/11/2021 9:53 AM ? ? ? ?

## 2021-11-11 NOTE — ED Notes (Signed)
Pt vital sign and CBG reviewed with provider pt to go to [facility] for med clearance ? ?

## 2021-11-11 NOTE — ED Notes (Signed)
ED TO INPATIENT HANDOFF REPORT ? ?ED Nurse Name and Phone #: Palak Tercero [phone] ? ?S ?Name/Age/Gender ?[name] [name]. ?21 y.o. ?male ?Room/Bed: 038C/038C ? ?Code Status ?  Code Status: Full Code ? ?Home/SNF/Other ?Home ?Patient oriented to: self, place, time, and situation ?Is this baseline? Yes  ? ?Triage Complete: Triage complete  ?Chief Complaint ?Partial small bowel obstruction ([place]) [K56.600] ?SBO (small bowel obstruction) ([place]) [K56.609] ? ?Triage Note ?Pt bib [facility] from [facility]. Pt did meth before going to [facility]. Pt here for hyperglycemia and cp. Pt Admits to SI.  Pt has had 250cc fluids. [place] checked pt CBG and gave pt orange juice.  ? ?EMS vitals ?120 HR ?98% O2 ?26RR ?142/102 BP ?CBG 486  ? ?Allergies ?Allergies  ?Allergen Reactions  ? Bee Venom Anaphylaxis  ? Fish Allergy Anaphylaxis  ? Shellfish Allergy Anaphylaxis  ? Tea Anaphylaxis  ? ? ?Level of Care/Admitting Diagnosis ?ED Disposition   ? ? ED Disposition  ?Admit  ? Condition  ?--  ? Comment  ?Hospital Area: [facility] [id number] ? Level of Care: Med-Surg [16] ? May place patient in observation at [facility] or [place] if equivalent level of care is available:: Yes ? Covid Evaluation: Asymptomatic - no recent exposure (last 10 days) testing not required ? Diagnosis: SBO (small bowel obstruction) ([place]) [id number] ? Admitting Physician: Thereasa Solo, JEFFREY T [id number] ? Attending Physician: Thereasa Solo, JEFFREY T [id number] ?  ?  ? ?  ? ? ?B ?Medical/Surgery History ?Past Medical History:  ?Diagnosis Date  ? ADD (attention deficit disorder)   ? Allergy   ? Anxiety   ? Asthma   ? Depression   ? Epileptic seizure ([place])   ? "both petit and grand mal; last sz ~ 2 wk ago" (06/17/2013)  ? [id number])   ? "2-3 times/wk usually" (06/17/2013)  ? Heart murmur   ? "heard a slight one earlier today" (06/17/2013)  ? Homeless   ? Migraine   ? "maybe once/month; it's severe" (06/17/2013)  ? ODD (oppositional defiant disorder)   ? Sickle cell trait ([place])   ?  Type I diabetes mellitus ([place])   ? "that's what they're thinking now" (06/17/2013)  ? Vision abnormalities   ? "takes him longer to focus cause; from his sz" (06/17/2013)  ? ?Past Surgical History:  ?Procedure Laterality Date  ? CIRCUMCISION  2000  ? FINGER SURGERY Left 2001  ? "crushed pinky; had to repair it" (06/17/2013)  ?  ? ?A ?IV Location/Drains/Wounds ?Patient Lines/Drains/Airways Status   ? ? Active Line/Drains/Airways   ? ? Name Placement date Placement time Site Days  ? Peripheral IV 11/11/21 20 G Left Antecubital 11/11/21  0122  Antecubital  less than 1  ? Peripheral IV 11/11/21 20 G Anterior;Right Forearm 11/11/21  0521  Forearm  less than 1  ? ?  ?  ? ?  ? ? ?Intake/Output Last 24 hours ? ?Intake/Output Summary (Last 24 hours) at 11/11/2021 1822 ?Last data filed at 11/11/2021 0734 ?Gross per 24 hour  ?Intake 1570.65 ml  ?Output 1200 ml  ?Net 370.65 ml  ? ? ?Labs/Imaging ?Results for orders placed or performed during the hospital encounter of 11/11/21 (from the past 48 hour(s))  ?CBG monitoring, ED     Status: Abnormal  ? Collection Time: 11/11/21  1:14 AM  ?Result Value Ref Range  ? Glucose-Capillary 547 (HH) 70 - 99 mg/dL  ?  Comment: Glucose reference range applies only to samples taken after fasting for at  least 8 hours.  ? Comment 1 Notify RN   ?CBC     Status: None  ? Collection Time: 11/11/21  1:45 AM  ?Result Value Ref Range  ? WBC 7.4 4.0 - 10.5 K/uL  ? RBC 4.85 4.22 - 5.81 MIL/uL  ? Hemoglobin 13.8 13.0 - 17.0 g/dL  ? HCT 40.7 39.0 - 52.0 %  ? MCV 83.9 80.0 - 100.0 fL  ? MCH 28.5 26.0 - 34.0 pg  ? MCHC 33.9 30.0 - 36.0 g/dL  ? RDW 13.6 11.5 - 15.5 %  ? Platelets 345 150 - 400 K/uL  ? nRBC 0.0 0.0 - 0.2 %  ?  Comment: Performed at [facility], [place] [address] [place], [place] [address]  ?Troponin I (High Sensitivity)     Status: None  ? Collection Time: 11/11/21  1:45 AM  ?Result Value Ref Range  ? Troponin I (High Sensitivity) 3 <18 ng/L  ?  Comment: (NOTE) ?Elevated high sensitivity  troponin I (hsTnI) values and significant  ?changes across serial measurements may suggest ACS but many other  ?chronic and acute conditions are known to elevate hsTnI results.  ?Refer to the Links section for chest pain algorithms and additional  ?guidance. ?Performed at [facility], [place] [address] [place], [place] ?[address] ?  ?Comprehensive metabolic panel     Status: Abnormal  ? Collection Time: 11/11/21  1:45 AM  ?Result Value Ref Range  ? Sodium 129 (L) 135 - 145 mmol/L  ? Potassium 4.1 3.5 - 5.1 mmol/L  ? Chloride 93 (L) 98 - 111 mmol/L  ? CO2 20 (L) 22 - 32 mmol/L  ? Glucose, Bld 530 (HH) 70 - 99 mg/dL  ?  Comment: Glucose reference range applies only to samples taken after fasting for at least 8 hours. ?CRITICAL RESULT CALLED TO, READ BACK BY AND VERIFIED WITH: ?Alvino Chapel RN 11/11/21 [place] ?  ? BUN 11 6 - 20 mg/dL  ? Creatinine, Ser 0.94 0.61 - 1.24 mg/dL  ? Calcium 9.2 8.9 - 10.3 mg/dL  ? Total Protein 7.5 6.5 - 8.1 g/dL  ? Albumin 3.7 3.5 - 5.0 g/dL  ? AST 196 (H) 15 - 41 U/L  ? ALT 336 (H) 0 - 44 U/L  ? Alkaline Phosphatase 148 (H) 38 - 126 U/L  ? Total Bilirubin 1.6 (H) 0.3 - 1.2 mg/dL  ? GFR, Estimated >60 >60 mL/min  ?  Comment: (NOTE) ?Calculated using the CKD-EPI Creatinine Equation (2021) ?  ? Anion gap 16 (H) 5 - 15  ?  Comment: Performed at [facility], [place] [address] [place], [place] [address]  ?I-Stat venous blood gas, [facility] ED only)     Status: Abnormal  ? Collection Time: 11/11/21  2:39 AM  ?Result Value Ref Range  ? pH, Ven 7.482 (H) 7.25 - 7.43  ? pCO2, Ven 31.4 (L) 44 - 60 mmHg  ? pO2, Ven 215 (H) 32 - 45 mmHg  ? Bicarbonate 23.5 20.0 - 28.0 mmol/L  ? TCO2 24 22 - 32 mmol/L  ? O2 Saturation 100 %  ? Acid-Base Excess 1.0 0.0 - 2.0 mmol/L  ? Sodium 128 (L) 135 - 145 mmol/L  ? Potassium 4.3 3.5 - 5.1 mmol/L  ? Calcium, Ion 1.10 (L) 1.15 - 1.40 mmol/L  ? HCT 44.0 39.0 - 52.0 %  ? Hemoglobin 15.0 13.0 - 17.0 g/dL  ? Sample type VENOUS   ?Urinalysis, Routine w reflex  microscopic  Urine, Clean Catch     Status: Abnormal  ? Collection Time: 11/11/21  3:24 AM  ?Result Value Ref Range  ? Color, Urine STRAW (A) YELLOW  ? APPearance CLEAR CLEAR  ? Specific Gravity, Urine 1.023 1.005 - 1.030  ? pH 6.0 5.0 - 8.0  ? Glucose, UA >=500 (A) NEGATIVE mg/dL  ? Hgb urine dipstick NEGATIVE NEGATIVE  ? Bilirubin Urine NEGATIVE NEGATIVE  ? Ketones, ur 20 (A) NEGATIVE mg/dL  ? Protein, ur NEGATIVE NEGATIVE mg/dL  ? Nitrite NEGATIVE NEGATIVE  ? Leukocytes,Ua NEGATIVE NEGATIVE  ? Bacteria, UA NONE SEEN NONE SEEN  ?  Comment: Performed at [facility], [place] [address], [place], [place] [address]  ?CBG monitoring, ED     Status: Abnormal  ? Collection Time: 11/11/21  3:39 AM  ?Result Value Ref Range  ? Glucose-Capillary 456 (H) 70 - 99 mg/dL  ?  Comment: Glucose reference range applies only to samples taken after fasting for at least 8 hours.  ?CBG monitoring, ED     Status: Abnormal  ? Collection Time: 11/11/21  5:08 AM  ?Result Value Ref Range  ? Glucose-Capillary 357 (H) 70 - 99 mg/dL  ?  Comment: Glucose reference range applies only to samples taken after fasting for at least 8 hours.  ?Troponin I (High Sensitivity)     Status: None  ? Collection Time: 11/11/21  5:17 AM  ?Result Value Ref Range  ? Troponin I (High Sensitivity) 3 <18 ng/L  ?  Comment: (NOTE) ?Elevated high sensitivity troponin I (hsTnI) values and significant  ?changes across serial measurements may suggest ACS but many other  ?chronic and acute conditions are known to elevate hsTnI results.  ?Refer to the "Links" section for chest pain algorithms and additional  ?guidance. ?Performed at [facility], [place] [address] [place], [place] ?[address] ?  ?Basic metabolic panel     Status: Abnormal  ? Collection Time: 11/11/21  5:18 AM  ?Result Value Ref Range  ? Sodium 130 (L) 135 - 145 mmol/L  ? Potassium 4.2 3.5 - 5.1 mmol/L  ? Chloride 95 (L) 98 - 111 mmol/L  ? CO2 22 22 - 32 mmol/L  ? Glucose, Bld 391 (H) 70 - 99 mg/dL  ?   Comment: Glucose reference range applies only to samples taken after fasting for at least 8 hours.  ? BUN 9 6 - 20 mg/dL  ? Creatinine, Ser 0.80 0.61 - 1.24 mg/dL  ? Calcium 8.8 (L) 8.9 - 10.3 mg/dL  ? GFR, E

## 2021-11-11 NOTE — Assessment & Plan Note (Signed)
Not clear that hes actually on any meds for this chronically. ?Looks like he was prescribed tegretol, but by psychiatry, and hes non-adherant to this med anyhow. ?

## 2021-11-11 NOTE — ED Triage Notes (Signed)
Pt bib [facility] from [facility]. Pt did meth before going to [facility]. Pt here for hyperglycemia and cp. Pt Admits to SI.  Pt has had 250cc fluids. [place] checked pt CBG and gave pt orange juice.  ? ?EMS vitals ?120 HR ?98% O2 ?26RR ?142/102 BP ?CBG 486 ?

## 2021-11-11 NOTE — Progress Notes (Signed)
? ?[name] [name].  [id number] DOB: 2001-05-07 DOA: 11/11/2021 ?PCP: Pcp, No   ? ?Brief Narrative:  ?21 year old with a history of DM 1, schizoaffective disorder, polysubstance abuse, seizure disorder, and homelessness who was admitted to the hospital May 8 through 12, 2023 for DKA.  He reportedly smoked methamphetamine the morning of his admission, and then later in the day presented to behavioral health reporting that he was suicidal.  There he was found to have markedly elevated CBG and was sent to the ED.  In the ED he denied nausea and vomiting but did not endorse right lower quadrant abdominal pain along with significant hunger. ? ?Consultants:  ?None ? ?Goals of Care:  Code Status: Full Code  ? ?DVT prophylaxis: ?Lovenox ? ?Interim Hx: ?Patient was interviewed and examined by one of my partners earlier today. ? ?As of his [id number] basic metabolic panel his bicarbonate level is 22 with an anion gap of 13 and CBGs in the 114-264 range.  I will therefore initiate a transition off of the insulin drip. ? ?Assessment & Plan: ? ?Partial small bowel obstruction ?RLQ abd pain w/ reported PSBO on CT scan - symptoms rapidly improved - advance diet and monitor bowel activity - if abd pain returns, or he develops N/V will initiate bowel protocol  ? ?Early DKA / uncontrolled DM1 ? ?Schizoaffective disorder, bipolar type - Suicidal Ideation  ?Psych consult pending ? ?Generalized convulsive epilepsy  ?Not clear that he is actually on any meds for this chronically ? ?Chronic hepatitis C -chronic transaminitis ?To follow-up with GI in outpatient setting to consider tx - discussed importance of this w/ the patient  ? ?Polysubstance abuse -tobacco and methamphetamine ? ?Family Communication:  ?Disposition: Ultimate disposition as per psychiatric evaluation ? ? ?Objective: ?Blood pressure 115/79, pulse 85, temperature 98.9 ?F (37.2 ?C), resp. rate 17, height 5\' 11"  (1.803 m), weight 68 kg, SpO2 100 %. ? ?Intake/Output Summary  (Last 24 hours) at 11/11/2021 1707 ?Last data filed at 11/11/2021 0734 ?Gross per 24 hour  ?Intake 1570.65 ml  ?Output 1200 ml  ?Net 370.65 ml  ? ?Filed Weights  ? 11/11/21 0125  ?Weight: 68 kg  ? ? ?Examination: ?Patient examined by one of my partners earlier today. ? ?CBC: ?Recent Labs  ?Lab 11/05/21 ?0900 11/11/21 ?0145 11/11/21 ?0239 11/11/21 ?0518 11/11/21 ?0601  ?WBC 6.6 7.4  --  7.0  --   ?NEUTROABS  --   --   --  3.5  --   ?HGB 11.9* 13.8 15.0 13.2 13.9  ?HCT 37.7* 40.7 44.0 39.2 41.0  ?MCV 93.3 83.9  --  84.3  --   ?PLT 261 345  --  317  --   ? ?Basic Metabolic Panel: ?Recent Labs  ?Lab 11/11/21 ?0145 11/11/21 ?0239 11/11/21 ?[id number] 11/11/21 ?0601 11/11/21 ?[id number]  ?NA 129*   < > 130* 131* 134*  ?K 4.1   < > 4.2 4.2 3.3*  ?CL 93*  --  95*  --  102  ?CO2 20*  --  22  --  22  ?GLUCOSE 530*  --  391*  --  101*  ?BUN 11  --  9  --  6  ?CREATININE 0.94  --  0.80  --  0.68  ?CALCIUM 9.2  --  8.8*  --  8.5*  ? < > = values in this interval not displayed.  ? ?GFR: ?Estimated Creatinine Clearance: 139.3 mL/min (by C-G formula based on SCr of 0.68 mg/dL). ? ?Liver Function Tests: ?  Recent Labs  ?Lab 11/11/21 ?0145  ?AST 196*  ?ALT 336*  ?ALKPHOS 148*  ?BILITOT 1.6*  ?PROT 7.5  ?ALBUMIN 3.7  ? ? ?HbA1C: ?Hgb A1c MFr Bld  ?Date/Time Value Ref Range Status  ?08/13/2021 07:01 PM 8.8 (H) 4.8 - 5.6 % Final  ?  Comment:  ?  (NOTE) ?Pre diabetes:          5.7%-6.4% ? ?Diabetes:              >6.4% ? ?Glycemic control for   <7.0% ?adults with diabetes ?  ?11/09/2020 06:15 AM 10.5 (H) 4.8 - 5.6 % Final  ?  Comment:  ?  (NOTE) ?Pre diabetes:          5.7%-6.4% ? ?Diabetes:              >6.4% ? ?Glycemic control for   <7.0% ?adults with diabetes ?  ? ? ?Scheduled Meds: ? enoxaparin (LOVENOX) injection  40 mg Subcutaneous Q24H  ? insulin aspart  0-15 Units Subcutaneous TID WC  ? insulin aspart  0-5 Units Subcutaneous QHS  ? insulin aspart  3 Units Subcutaneous TID WC  ? insulin glargine-yfgn  40 Units Subcutaneous BID  ? ?Continuous  Infusions: ? ? ? ? LOS: 0 days  ? ?Cherene Altes, MD ?[facility] ?Office  [phone] ?Pager - Text Page per Shea Evans ? ?If 7PM-7AM, please contact night-coverage per Amion ?11/11/2021, 5:07 PM ? ? ? ? ?

## 2021-11-11 NOTE — Assessment & Plan Note (Signed)
Ongoing LFT elevations ?1. F/u with GI ?
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
## 2021-11-11 NOTE — Assessment & Plan Note (Addendum)
Very mild DKA ?1. Glucostabilizer ?2. BMP Q4H ?3. AG only 16 to start with ?4. IVF bolus and maint per pathway. ?

## 2021-11-11 NOTE — ED Notes (Signed)
Pt sent to [name] for medical clearance possible DKA condition remains stable. Pt belongs gathered and sent with EMS personal. ?

## 2021-11-11 NOTE — ED Provider Notes (Signed)
?[facility]-EMERGENCY DEPT ?[facility] Emergency Department ?Provider Note ?MRN:  [id number]  ?Arrival date & time: 11/11/21    ? ?Chief Complaint   ?Chest Pain and Hyperglycemia ?  ?History of Present Illness   ?[name] [name]. is a 21 y.o. year-old male presents to the ED with chief complaint of chest pain and hyperglycemia.  Was transferred over from [facility] after he made the complaint of chest pain.  He has been recommended for inpatient psychiatric evaluation.  Patient complains of having used meth prior to going to [facility].  He states that he is having chest pain and abdominal pain.  He has hx of DM1 and was recently admitted for DKA.  He denies any vomiting or SOB.  He denies fever or chills. ? ?History provided by patient. ? ? ?Review of Systems  ?Pertinent review of systems noted in HPI.  ? ? ?Physical Exam  ? ?Vitals:  ? 11/11/21 0115 11/11/21 0331  ?BP: (!) 152/107 (!) 136/104  ?Pulse: 77 (!) 120  ?Resp: 16 (!) 24  ?Temp: 98.9 ?F (37.2 ?C)   ?SpO2: 98% 99%  ?  ?CONSTITUTIONAL:  agitated-appearing, NAD ?NEURO:  Alert and oriented x 3, CN 3-12 grossly intact ?EYES:  eyes equal and reactive ?ENT/NECK:  Supple, no stridor  ?CARDIO:  normal rate, regular rhythm, appears well-perfused  ?PULM:  No respiratory distress, CTAB ?GI/GU:  non-distended,  ?MSK/SPINE:  No gross deformities, no edema, moves all extremities  ?SKIN:  no rash, atraumatic ? ? ?*Additional and/or pertinent findings included in MDM below ? ?Diagnostic and Interventional Summary  ? ? EKG Interpretation ? ?Date/Time:  Monday Nov 11 2021 01:17:25 EDT ?Ventricular Rate:  111 ?PR Interval:  144 ?QRS Duration: 78 ?QT Interval:  318 ?QTC Calculation: 432 ?R Axis:   59 ?Text Interpretation: Sinus tachycardia Nonspecific ST and T wave abnormality Abnormal ECG When compared with ECG of 04-Nov-2021 14:16, PREVIOUS ECG IS PRESENT Confirmed by Marily Memos [phone]) on 11/11/2021 2:41:10 AM ?  ? ?  ? ?Labs Reviewed  ?COMPREHENSIVE METABOLIC PANEL - Abnormal;  Notable for the following components:  ?    Result Value  ? Sodium 129 (*)   ? Chloride 93 (*)   ? CO2 20 (*)   ? Glucose, Bld 530 (*)   ? AST 196 (*)   ? ALT 336 (*)   ? Alkaline Phosphatase 148 (*)   ? Total Bilirubin 1.6 (*)   ? Anion gap 16 (*)   ? All other components within normal limits  ?URINALYSIS, ROUTINE W REFLEX MICROSCOPIC - Abnormal; Notable for the following components:  ? Color, Urine STRAW (*)   ? Glucose, UA >=500 (*)   ? Ketones, ur 20 (*)   ? All other components within normal limits  ?CBG MONITORING, ED - Abnormal; Notable for the following components:  ? Glucose-Capillary 547 (*)   ? All other components within normal limits  ?I-STAT VENOUS BLOOD GAS, ED - Abnormal; Notable for the following components:  ? pH, Ven 7.482 (*)   ? pCO2, Ven 31.4 (*)   ? pO2, Ven 215 (*)   ? Sodium 128 (*)   ? Calcium, Ion 1.10 (*)   ? All other components within normal limits  ?CBG MONITORING, ED - Abnormal; Notable for the following components:  ? Glucose-Capillary 456 (*)   ? All other components within normal limits  ?CBC  ?BASIC METABOLIC PANEL  ?BASIC METABOLIC PANEL  ?BASIC METABOLIC PANEL  ?BASIC METABOLIC PANEL  ?BASIC METABOLIC  PANEL  ?BETA-HYDROXYBUTYRIC ACID  ?BETA-HYDROXYBUTYRIC ACID  ?BETA-HYDROXYBUTYRIC ACID  ?CBC WITH DIFFERENTIAL/PLATELET  ?I-STAT VENOUS BLOOD GAS, ED  ?TROPONIN I (HIGH SENSITIVITY)  ?TROPONIN I (HIGH SENSITIVITY)  ?  ?CT ABDOMEN PELVIS W CONTRAST  ?Final Result  ?  ?DG Chest 2 View  ?Final Result  ?  ?  ?Medications  ?insulin regular, human (MYXREDLIN) 100 units/ 100 mL infusion (has no administration in time range)  ?lactated ringers infusion (has no administration in time range)  ?dextrose 5 % in lactated ringers infusion (0 mLs Intravenous Hold 11/11/21 0440)  ?dextrose 50 % solution 0-50 mL (has no administration in time range)  ?potassium chloride 10 mEq in 100 mL IVPB (10 mEq Intravenous New Bag/Given 11/11/21 0440)  ?HYDROmorphone (DILAUDID) injection 0.5 mg (0.5 mg  Intravenous Given 11/11/21 0124)  ?ondansetron [facility]) injection 4 mg (4 mg Intravenous Given 11/11/21 0123)  ?haloperidol (HALDOL) tablet 5 mg (5 mg Oral Given 11/11/21 0123)  ?lactated ringers bolus 1,360 mL (1,360 mLs Intravenous New Bag/Given 11/11/21 0439)  ?HYDROmorphone (DILAUDID) injection 0.5 mg (0.5 mg Intravenous Given 11/11/21 0343)  ?iohexol (OMNIPAQUE) 300 MG/ML solution 100 mL (100 mLs Intravenous Contrast Given 11/11/21 0403)  ?  ? ?Procedures  /  Critical Care ?.Critical Care ?Performed by: Roxy Horseman, PA-C ?Authorized by: Roxy Horseman, PA-C  ? ?Critical care provider statement:  ?  Critical care time (minutes):  41 ?  Critical care was necessary to treat or prevent imminent or life-threatening deterioration of the following conditions:  Metabolic crisis ?  Critical care was time spent personally by me on the following activities:  Development of treatment plan with patient or surrogate, discussions with consultants, evaluation of patient's response to treatment, examination of patient, ordering and review of laboratory studies, ordering and review of radiographic studies, ordering and performing treatments and interventions, pulse oximetry, re-evaluation of patient's condition and review of old charts ? ?ED Course and Medical Decision Making  ?I have reviewed the triage vital signs, the nursing notes, and pertinent available records from the EMR. ? ?Social Determinants Affecting Complexity of Care: ?Patient suffers from drug abuse/addiction. ? ? ?ED Course: ?  ?Patient here with chest pain and hyperglycemia.  Top differential diagnoses include DKA, drug reaction. ?Medical Decision Making ?Patient will need psych eval given that he was recommend for inpatient psychiatric care prior to being transferred to ED. ? ?Problems Addressed: ?Hepatitis: ?   Details: Tranminases remain elevated.  Prior notes show that patient is to follow-up with GI for this. ?Hyperglycemia: chronic illness or injury ?    Details: Hx of DKA, recently admitted for the same. ?SBO (small bowel obstruction) (HCC): acute illness or injury ?   Details: RLQ abdominal pain, CT concerning for SBO.  Very tender on exam. ? ?Amount and/or Complexity of Data Reviewed ?Labs: ordered. ?   Details: Glucose is elevated to the 500s, giving fluids.  Consider early DKA, but no acidotic on VBG.  Anion gap is only 16 and CO2 is 20. ?Radiology: ordered. ?   Details: Ct concerning for SBO, ?cystitis v pyelo, but UA not suggestive of this. ? ?Risk ?Prescription drug management. ?Decision regarding hospitalization. ? ?  ? ?Consultants: ?I discussed the case with Hospitalist, Dr. Julian Reil, who is appreciated for admitting. ? ? ?Treatment and Plan: ?Patient's exam and diagnostic results are concerning for SBO.  Feel that patient will need admission to the hospital for further treatment and evaluation. ? ? ? ?Final Clinical Impressions(s) / ED Diagnoses  ? ?  ICD-10-CM   ?1. SBO (small bowel obstruction) (HCC)  K56.609   ?  ?2. Hyperglycemia  R73.9   ?  ?3. Hepatitis  K75.9   ?  ?  ?ED Discharge Orders   ? ? None  ? ?  ?  ? ? ?Discharge Instructions Discussed with and Provided to Patient:  ? ?Discharge Instructions   ?None ?  ? ?  ?Roxy Horseman, PA-C ?11/11/21 8413 ? ?  ?Marily Memos, MD ?11/11/21 [phone] ? ?

## 2021-11-12 ENCOUNTER — Observation Stay (HOSPITAL_COMMUNITY): Payer: Medicaid Other

## 2021-11-12 DIAGNOSIS — G40409 Other generalized epilepsy and epileptic syndromes, not intractable, without status epilepticus: Secondary | ICD-10-CM | POA: Diagnosis present

## 2021-11-12 DIAGNOSIS — Z79899 Other long term (current) drug therapy: Secondary | ICD-10-CM | POA: Diagnosis not present

## 2021-11-12 DIAGNOSIS — K56600 Partial intestinal obstruction, unspecified as to cause: Secondary | ICD-10-CM | POA: Diagnosis present

## 2021-11-12 DIAGNOSIS — Z794 Long term (current) use of insulin: Secondary | ICD-10-CM | POA: Diagnosis not present

## 2021-11-12 DIAGNOSIS — Z825 Family history of asthma and other chronic lower respiratory diseases: Secondary | ICD-10-CM | POA: Diagnosis not present

## 2021-11-12 DIAGNOSIS — Z83438 Family history of other disorder of lipoprotein metabolism and other lipidemia: Secondary | ICD-10-CM | POA: Diagnosis not present

## 2021-11-12 DIAGNOSIS — R45851 Suicidal ideations: Secondary | ICD-10-CM | POA: Diagnosis present

## 2021-11-12 DIAGNOSIS — B182 Chronic viral hepatitis C: Secondary | ICD-10-CM | POA: Diagnosis present

## 2021-11-12 DIAGNOSIS — Z9151 Personal history of suicidal behavior: Secondary | ICD-10-CM | POA: Diagnosis not present

## 2021-11-12 DIAGNOSIS — R739 Hyperglycemia, unspecified: Secondary | ICD-10-CM | POA: Diagnosis present

## 2021-11-12 DIAGNOSIS — F17210 Nicotine dependence, cigarettes, uncomplicated: Secondary | ICD-10-CM | POA: Diagnosis present

## 2021-11-12 DIAGNOSIS — F250 Schizoaffective disorder, bipolar type: Secondary | ICD-10-CM | POA: Diagnosis present

## 2021-11-12 DIAGNOSIS — Z833 Family history of diabetes mellitus: Secondary | ICD-10-CM | POA: Diagnosis not present

## 2021-11-12 DIAGNOSIS — Z8249 Family history of ischemic heart disease and other diseases of the circulatory system: Secondary | ICD-10-CM | POA: Diagnosis not present

## 2021-11-12 DIAGNOSIS — F1520 Other stimulant dependence, uncomplicated: Secondary | ICD-10-CM | POA: Diagnosis present

## 2021-11-12 DIAGNOSIS — Z560 Unemployment, unspecified: Secondary | ICD-10-CM | POA: Diagnosis not present

## 2021-11-12 DIAGNOSIS — Z5901 Sheltered homelessness: Secondary | ICD-10-CM | POA: Diagnosis not present

## 2021-11-12 DIAGNOSIS — D573 Sickle-cell trait: Secondary | ICD-10-CM | POA: Diagnosis present

## 2021-11-12 DIAGNOSIS — E1010 Type 1 diabetes mellitus with ketoacidosis without coma: Secondary | ICD-10-CM | POA: Diagnosis present

## 2021-11-12 LAB — COMPREHENSIVE METABOLIC PANEL
ALT: 272 U/L — ABNORMAL HIGH (ref 0–44)
AST: 290 U/L — ABNORMAL HIGH (ref 15–41)
Albumin: 3 g/dL — ABNORMAL LOW (ref 3.5–5.0)
Alkaline Phosphatase: 125 U/L (ref 38–126)
Anion gap: 10 (ref 5–15)
BUN: 13 mg/dL (ref 6–20)
CO2: 23 mmol/L (ref 22–32)
Calcium: 8.6 mg/dL — ABNORMAL LOW (ref 8.9–10.3)
Chloride: 101 mmol/L (ref 98–111)
Creatinine, Ser: 0.65 mg/dL (ref 0.61–1.24)
GFR, Estimated: >60 mL/min (ref >60)
Glucose, Bld: 293 mg/dL — ABNORMAL HIGH (ref 70–99)
Potassium: 4 mmol/L (ref 3.5–5.1)
Sodium: 134 mmol/L — ABNORMAL LOW (ref 135–145)
Total Bilirubin: 0.3 mg/dL (ref 0.3–1.2)
Total Protein: 6 g/dL — ABNORMAL LOW (ref 6.5–8.1)

## 2021-11-12 LAB — GLUCOSE, CAPILLARY
Glucose-Capillary: 281 mg/dL — ABNORMAL HIGH (ref 70–99)
Glucose-Capillary: 349 mg/dL — ABNORMAL HIGH (ref 70–99)
Glucose-Capillary: 176 mg/dL — ABNORMAL HIGH (ref 70–99)
Glucose-Capillary: 268 mg/dL — ABNORMAL HIGH (ref 70–99)

## 2021-11-12 LAB — MAGNESIUM: Magnesium: 2.1 mg/dL (ref 1.7–2.4)

## 2021-11-12 LAB — PHOSPHORUS: Phosphorus: 4.8 mg/dL — ABNORMAL HIGH (ref 2.5–4.6)

## 2021-11-12 MED ORDER — INSULIN ASPART 100 UNIT/ML IJ SOLN
8.0000 [IU] | Freq: Three times a day (TID) | INTRAMUSCULAR | Status: DC
  Administered 2021-11-13 (×2): 8 [IU] via SUBCUTANEOUS

## 2021-11-12 MED ORDER — INSULIN ASPART 100 UNIT/ML IJ SOLN
6.0000 [IU] | Freq: Three times a day (TID) | INTRAMUSCULAR | Status: DC
  Administered 2021-11-12 (×2): 6 [IU] via SUBCUTANEOUS

## 2021-11-12 MED ORDER — INSULIN GLARGINE-YFGN 100 UNIT/ML ~~LOC~~ SOLN
44.0000 [IU] | Freq: Two times a day (BID) | SUBCUTANEOUS | Status: DC
  Administered 2021-11-12 – 2021-11-13 (×2): 44 [IU] via SUBCUTANEOUS
  Filled 2021-11-12 (×3): qty 0.44

## 2021-11-12 MED ORDER — INSULIN ASPART 100 UNIT/ML IJ SOLN
0.0000 [IU] | Freq: Three times a day (TID) | INTRAMUSCULAR | Status: DC
  Administered 2021-11-12: 4 [IU] via SUBCUTANEOUS
  Administered 2021-11-12: 15 [IU] via SUBCUTANEOUS
  Administered 2021-11-13: 4 [IU] via SUBCUTANEOUS
  Administered 2021-11-13: 11 [IU] via SUBCUTANEOUS

## 2021-11-12 MED ORDER — INSULIN ASPART 100 UNIT/ML IJ SOLN
0.0000 [IU] | Freq: Every day | INTRAMUSCULAR | Status: DC
  Administered 2021-11-12: 3 [IU] via SUBCUTANEOUS
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
## 2021-11-12 NOTE — Progress Notes (Signed)
? ?[name]  WIO:[id number] DOB: Aug 12, 2000 DOA: 11/11/2021 ?PCP: Pcp, No   ? ?Brief Narrative:  ?20yo with a history of DM 1, schizoaffective disorder, polysubstance abuse, seizure disorder, and homelessness who was admitted to the hospital May 8 through 12, 2023 for DKA.  He reportedly smoked methamphetamine the morning of his admission, and then later in the day presented to behavioral health reporting that he was suicidal.  There he was found to have markedly elevated CBG and was sent to the ED.  In the ED he denied nausea and vomiting but did not endorse right lower quadrant abdominal pain along with significant hunger. ? ?Consultants:  ?None ? ?Goals of Care:  Code Status: Full Code  ? ?DVT prophylaxis: ?Lovenox ? ?Interim Hx: ?The patient is tolerating oral intake without difficulty.  Follow-up KUB suggests improvement in bowel gas pattern which is now felt to be normal.  Vital signs are stable.  His very early DKA is fully resolved, but his CBGs remain very poorly controlled.  He denies chest pain shortness of breath fevers or chills. ? ?He has been deemed stable for discharge from a psychiatric standpoint by the consulting psychiatry team. ? ?Assessment & Plan: ? ?Partial small bowel obstruction - resolved  ?RLQ abd pain w/ reported PSBO on CT scan - symptoms rapidly improved - advanced diet and patient tolerating without difficulty -follow-up KUB 5/16 confirms normal bowel gas pattern ? ?Early DKA / uncontrolled DM1 ?DKA resolved with normal bicarb and anion gap -quickly able to be titrated off insulin drip -CBG remains uncontrolled with significant variation -adjust treatment and follow -social work has confirmed patient will be to receive his meds from the [facility] pharmacy prior to his discharge from the hospital ? ?Schizoaffective disorder, bipolar type - Suicidal Ideation  ?Psych consult provided and the psychiatry team has cleared him for discharge from a psychiatric standpoint - I  discussed this with the Psych team and they confirmed they do not feel he needs inpatient psychiatric hospitalization, but would instead benefit from residential substance abuse rehab  ? ?Generalized convulsive epilepsy  ?Not clear that he is actually on any meds for this chronically - stable at present  ? ?Chronic hepatitis C -chronic transaminitis ?To follow-up with GI in outpatient setting to consider tx - discussed importance of this w/ the patient, and the fact that he could be cured of hepatitis C and avoid liver failure if he is compliant with treatment ? ?Polysubstance abuse - tobacco and methamphetamine ?Patient has been counseled extensively by social work on [employer] for rehabilitation and informed that he must self refer ? ?Family Communication: No family present at time of exam ?Disposition: Anticipate discharge 5/17 if CBG better controlled -medications will need to be prescribed to the [facility] pharmacy so they can be provided to the patient prior to his discharge ? ? ?Objective: ?Blood pressure 101/61, pulse 79, temperature 97.7 ?F (36.5 ?C), temperature source Oral, resp. rate 18, height 5\' 11"  (1.803 m), weight 68 kg, SpO2 99 %. ? ?Intake/Output Summary (Last 24 hours) at 11/12/2021 1107 ?Last data filed at 11/11/2021 2205 ?Gross per 24 hour  ?Intake 240 ml  ?Output --  ?Net 240 ml  ? ? ?Filed Weights  ? 11/11/21 0125  ?Weight: 68 kg  ? ? ?Examination: ?General: No acute respiratory distress ?Lungs: Clear to auscultation bilaterally without wheezes or crackles ?Cardiovascular: Regular rate and rhythm without murmur gallop or rub normal S1 and S2 ?Abdomen: Nontender, nondistended, soft,  bowel sounds positive, no rebound, no ascites, no appreciable mass ?Extremities: No significant cyanosis, clubbing, or edema bilateral lower extremities ? ? ?CBC: ?Recent Labs  ?Lab 11/11/21 ?0145 11/11/21 ?0239 11/11/21 ?0518 11/11/21 ?0601  ?WBC 7.4  --  7.0  --   ?NEUTROABS  --   --  3.5  --   ?HGB 13.8 15.0  13.2 13.9  ?HCT 40.7 44.0 39.2 41.0  ?MCV 83.9  --  84.3  --   ?PLT 345  --  317  --   ? ? ?Basic Metabolic Panel: ?Recent Labs  ?Lab 11/11/21 ?3810 11/11/21 ?0601 11/11/21 ?1751 11/12/21 ?0243  ?NA 130* 131* 134* 134*  ?K 4.2 4.2 3.3* 4.0  ?CL 95*  --  102 101  ?CO2 22  --  22 23  ?GLUCOSE 391*  --  101* 293*  ?BUN 9  --  6 13  ?CREATININE 0.80  --  0.68 0.65  ?CALCIUM 8.8*  --  8.5* 8.6*  ?MG  --   --   --  2.1  ?PHOS  --   --   --  4.8*  ? ? ?GFR: ?Estimated Creatinine Clearance: 139.3 mL/min (by C-G formula based on SCr of 0.65 mg/dL). ? ?Liver Function Tests: ?Recent Labs  ?Lab 11/11/21 ?0145 11/12/21 ?0243  ?AST 196* 290*  ?ALT 336* 272*  ?ALKPHOS 148* 125  ?BILITOT 1.6* 0.3  ?PROT 7.5 6.0*  ?ALBUMIN 3.7 3.0*  ? ? ? ?HbA1C: ?Hgb A1c MFr Bld  ?Date/Time Value Ref Range Status  ?08/13/2021 07:01 PM 8.8 (H) 4.8 - 5.6 % Final  ?  Comment:  ?  (NOTE) ?Pre diabetes:          5.7%-6.4% ? ?Diabetes:              >6.4% ? ?Glycemic control for   <7.0% ?adults with diabetes ?  ?11/09/2020 06:15 AM 10.5 (H) 4.8 - 5.6 % Final  ?  Comment:  ?  (NOTE) ?Pre diabetes:          5.7%-6.4% ? ?Diabetes:              >6.4% ? ?Glycemic control for   <7.0% ?adults with diabetes ?  ? ? ?Scheduled Meds: ? chlorproMAZINE  10 mg Oral BID  ? chlorproMAZINE  25 mg Oral QHS  ? enoxaparin (LOVENOX) injection  40 mg Subcutaneous Q24H  ? haloperidol  5 mg Oral BID  ? insulin aspart  0-15 Units Subcutaneous TID WC  ? insulin aspart  0-5 Units Subcutaneous QHS  ? insulin aspart  3 Units Subcutaneous TID WC  ? insulin glargine-yfgn  40 Units Subcutaneous BID  ? ? ? LOS: 0 days  ? ?Lonia Blood, MD ?[facility] ?Office  [phone] ?Pager - Text Page per Loretha Stapler ? ?If 7PM-7AM, please contact night-coverage per Amion ?11/12/2021, 11:07 AM ? ? ? ? ?
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
## 2021-11-12 NOTE — Progress Notes (Signed)
Mobility Specialist Progress Note  ? ? 11/12/21 1111  ?Mobility  ?Activity Ambulated independently in hallway  ?Level of Assistance Independent  ?Assistive Device None  ?Distance Ambulated (ft) 570 ft  ?Activity Response Tolerated well  ?$Mobility charge 1 Mobility  ? ?Pt received in bed and agreeable. No complaints on walk. Returned to bed with call bell in reach.   ? ?[place] ?Mobility Specialist  ?Primary: 5N M.S. Phone: [phone] ?Secondary: 6N M.S. Phone: [phone] ?  ?

## 2021-11-13 ENCOUNTER — Other Ambulatory Visit (HOSPITAL_COMMUNITY): Payer: Self-pay

## 2021-11-13 DIAGNOSIS — K56600 Partial intestinal obstruction, unspecified as to cause: Secondary | ICD-10-CM | POA: Diagnosis not present

## 2021-11-13 LAB — BASIC METABOLIC PANEL
Anion gap: 8 (ref 5–15)
BUN: 13 mg/dL (ref 6–20)
CO2: 22 mmol/L (ref 22–32)
Calcium: 8.7 mg/dL — ABNORMAL LOW (ref 8.9–10.3)
Chloride: 107 mmol/L (ref 98–111)
Creatinine, Ser: 0.5 mg/dL — ABNORMAL LOW (ref 0.61–1.24)
GFR, Estimated: >60 mL/min (ref >60)
Glucose, Bld: 193 mg/dL — ABNORMAL HIGH (ref 70–99)
Potassium: 3.6 mmol/L (ref 3.5–5.1)
Sodium: 137 mmol/L (ref 135–145)

## 2021-11-13 LAB — GLUCOSE, CAPILLARY
Glucose-Capillary: 151 mg/dL — ABNORMAL HIGH (ref 70–99)
Glucose-Capillary: 253 mg/dL — ABNORMAL HIGH (ref 70–99)

## 2021-11-13 MED ORDER — ACCU-CHEK GUIDE W/DEVICE KIT
PACK | 0 refills | Status: DC
  Filled 2021-11-13: qty 1, 30d supply, fill #0

## 2021-11-13 MED ORDER — ACCU-CHEK GUIDE VI STRP
1.0000 | ORAL_STRIP | 1 refills | Status: DC | PRN
  Filled 2021-11-13: qty 100, 50d supply, fill #0

## 2021-11-13 MED ORDER — INSULIN PEN NEEDLE 32G X 4 MM MISC
1 refills | Status: DC
  Filled 2021-11-13: qty 100, 30d supply, fill #0

## 2021-11-13 MED ORDER — ACCU-CHEK SOFTCLIX LANCETS MISC
1.0000 | Freq: Two times a day (BID) | 1 refills | Status: DC
  Filled 2021-11-13: qty 100, 50d supply, fill #0

## 2021-11-13 MED ORDER — CHLORPROMAZINE HCL 10 MG PO TABS
10.0000 mg | ORAL_TABLET | Freq: Two times a day (BID) | ORAL | 0 refills | Status: DC
  Filled 2021-11-13: qty 60, 30d supply, fill #0

## 2021-11-13 MED ORDER — NOVOLOG FLEXPEN 100 UNIT/ML ~~LOC~~ SOPN
8.0000 [IU] | PEN_INJECTOR | Freq: Three times a day (TID) | SUBCUTANEOUS | 1 refills | Status: DC
  Filled 2021-11-13: qty 15, 62d supply, fill #0

## 2021-11-13 MED ORDER — CHLORPROMAZINE HCL 25 MG PO TABS
25.0000 mg | ORAL_TABLET | Freq: Every day | ORAL | 0 refills | Status: DC
  Filled 2021-11-13: qty 30, 30d supply, fill #0

## 2021-11-13 NOTE — Progress Notes (Signed)
Mobility Specialist Progress Note  ? ? 11/13/21 1140  ?Mobility  ?Activity Ambulated independently in hallway  ?Level of Assistance Independent  ?Assistive Device None  ?Distance Ambulated (ft) 1000 ft  ?Activity Response Tolerated well  ?$Mobility charge 1 Mobility  ? ?Pt received in bed and agreeable. No complaints on walk. Returned to chair with call bell in reach.   ? ?[place] ?Mobility Specialist  ?Primary: 5N M.S. Phone: [phone] ?Secondary: 6N M.S. Phone: [phone] ?  ?

## 2021-11-13 NOTE — Discharge Summary (Signed)
Physician Discharge Summary  ?[name] [name]. POE:[id number] DOB: [birth year] DOA: 11/11/2021 ? ?PCP: Pcp, No ? ?Admit date: 11/11/2021 ?Discharge date: 11/13/2021 ? ?Admitted From: Home ?Disposition:  Home ? ?Discharge Condition:Stable ?CODE STATUS:FULL ?Diet recommendation:  Carb Modified  ? ?Brief/Interim Summary: ? ?21yo with a history of DM 1, schizoaffective disorder, polysubstance abuse, seizure disorder, and homelessness who was admitted to the hospital May 8 through 12, 2023 for DKA.  He reportedly smoked methamphetamine the morning of his admission, and then later in the day presented to behavioral health reporting that he was suicidal.  There he was found to have markedly elevated CBG and was sent to the ED.  In the ED he denied nausea and vomiting but did not endorse right lower quadrant abdominal pain along with significant hunger.  He was started on insulin drip for DKA.  Patient was also found to be in partial small bowel obstruction as per CT scan was resulted spontaneously.  Diabetic coordinator  was following.  He was also seen by psychiatry during this hospitalization, no evidence of suicidal or homicidal tendency.  Medically stable for discharge today. ? ?Following problems were addressed during his hospitalization: ? ?Partial small bowel obstruction - resolved  ?RLQ abd pain w/ reported PSBO on CT scan - symptoms rapidly improved - advanced diet and patient tolerating without difficulty -follow-up KUB 5/16 confirms normal bowel gas pattern ?  ?Early DKA / uncontrolled DM1 ?DKA resolved with normal bicarb and anion gap -quickly able to be titrated off insulin drip -CBG remains uncontrolled with significant variation -adjust treatment and follow -social work has confirmed patient will be to receive his meds from the [facility] prior to his discharge from the hospital ?  ?Schizoaffective disorder, bipolar type - Suicidal Ideation  ?Psych consult provided and the psychiatry team has  cleared him for discharge from a psychiatric standpoint - we discussed this with the Psych team and they confirmed they do not feel he needs inpatient psychiatric hospitalization, but would instead benefit from residential substance abuse rehab  ?  ?Generalized convulsive epilepsy  ?Not clear that he is actually on any meds for this chronically - stable at present  ?  ?Chronic hepatitis C -chronic transaminitis ?To follow-up with GI in outpatient setting to consider tx - discussed importance of this w/ the patient, and the fact that he could be cured of hepatitis C and avoid liver failure if he is compliant with treatment ?  ?Polysubstance abuse - tobacco and methamphetamine ?Patient has been counseled extensively by social work on [employer] for rehabilitation and informed that he must self refer ? ?Discharge Diagnoses:  ?Principal Problem: ?  Partial small bowel obstruction ([place]) ?Active Problems: ?  DKA (diabetic ketoacidosis) ([place]) ?  Generalized convulsive epilepsy ([place]) ?  Schizoaffective disorder, bipolar type ([place]) ?  Methamphetamine use disorder, moderate ([place]) ?  Hep C w/o coma, chronic (HCC) ?  DKA, type 1 ([place]) ?  SBO (small bowel obstruction) ([place]) ? ? ? ?Discharge Instructions ? ?Discharge Instructions   ? ? Diet Carb Modified   Complete by: As directed ?  ? Discharge instructions   Complete by: As directed ?  ? 1)Take the prescribed medications as instructed ?2)Monitor your sugars ?3)Follow up with your PCP and psychiatrist  ? Increase activity slowly   Complete by: As directed ?  ? ?  ? ?Allergies as of 11/13/2021   ? ?   Reactions  ? Bee Venom Anaphylaxis  ? Fish Allergy  Anaphylaxis  ? Shellfish Allergy Anaphylaxis  ? Tea Anaphylaxis  ? ?  ? ?  ?Medication List  ?  ? ?STOP taking these medications   ? ?lisinopril 5 MG tablet ?Commonly known as: ZESTRIL ?  ?tamsulosin 0.4 MG Caps capsule ?Commonly known as: FLOMAX ?  ? ?  ? ?TAKE these medications   ? ?Advocate Insulin Pen Needles 29G X  12.7MM Misc ?Generic drug: Insulin Pen Needle ?For type I diabetes ?  ?blood glucose meter kit and supplies Kit ?Dispense based on patient and insurance preference. Use up to four times daily as directed. (FOR ICD-9 250.00, 250.01). ?  ?carbamazepine 200 MG tablet ?Commonly known as: TEGRETOL ?Take 1 tablet (200 mg total) by mouth every 12 (twelve) hours. ?  ?chlorproMAZINE 10 MG tablet ?Commonly known as: THORAZINE ?Take 1 tablet (10 mg total) by mouth 2 (two) times daily. ?  ?chlorproMAZINE 25 MG tablet ?Commonly known as: THORAZINE ?Take 1 tablet (25 mg total) by mouth at bedtime. ?  ?EPINEPHrine 0.3 mg/0.3 mL Soaj injection ?Commonly known as: EPI-PEN ?Inject 0.3 mg into the muscle once as needed for anaphylaxis (severe allergic reaction). ?  ?FREESTYLE TEST STRIPS test strip ?Generic drug: glucose blood ?1 each by Other route as needed for other. ?What changed:  ?how much to take ?how to take this ?when to take this ?reasons to take this ?additional instructions ?  ?insulin glargine 100 UNIT/ML Solostar Pen ?Commonly known as: LANTUS ?Inject 40 Units into the skin in the morning and at bedtime. ?  ?Lancets Misc. Misc ?1 each by Does not apply route in the morning and at bedtime. ?  ?NovoLOG FlexPen 100 UNIT/ML FlexPen ?Generic drug: insulin aspart ?Inject 8 Units into the skin 3 (three) times daily with meals. ?What changed:  ?how much to take ?additional instructions ?  ?oxyCODONE 5 MG immediate release tablet ?Commonly known as: Oxy IR/ROXICODONE ?Take 1 tablet (5 mg total) by mouth every 6 (six) hours as needed for severe pain. ?  ? ?  ? ? ?Allergies  ?Allergen Reactions  ? Bee Venom Anaphylaxis  ? Fish Allergy Anaphylaxis  ? Shellfish Allergy Anaphylaxis  ? Tea Anaphylaxis  ? ? ?Consultations: ?Psychiatry ? ? ?Procedures/Studies: ?CT ABDOMEN PELVIS WO CONTRAST ? ?Result Date: 11/04/2021 ?CLINICAL DATA:  Abdominal pain, nausea, vomiting EXAM: CT ABDOMEN AND PELVIS WITHOUT CONTRAST TECHNIQUE: Multidetector CT  imaging of the abdomen and pelvis was performed following the standard protocol without IV contrast. RADIATION DOSE REDUCTION: This exam was performed according to the departmental dose-optimization program which includes automated exposure control, adjustment of the mA and/or kV according to patient size and/or use of iterative reconstruction technique. COMPARISON:  03/13/2021 FINDINGS: Lower chest: Included lung bases are clear.  Heart size is normal. Hepatobiliary: Hepatomegaly. No focal liver lesion identified on unenhanced images. Gallbladder within normal limits. No hyperdense gallstone. No biliary dilatation. Pancreas: Unremarkable. No pancreatic ductal dilatation or surrounding inflammatory changes. Spleen: Normal in size without focal abnormality. Adrenals/Urinary Tract: Adrenal glands are unremarkable. Kidneys are normal, without renal calculi, focal lesion, or hydronephrosis. Urinary bladder is markedly distended above the level of the umbilicus. Stomach/Bowel: Stomach is within normal limits. Appendix appears normal (series 2, image 66). Fecalization of distal small bowel contents. No evidence of bowel wall thickening, distention, or inflammatory changes. Vascular/Lymphatic: No significant vascular findings are present. No enlarged abdominal or pelvic lymph nodes. Reproductive: Prostate is unremarkable. Other: No free fluid. No abdominopelvic fluid collection. No pneumoperitoneum. No abdominal wall hernia. Musculoskeletal: Redemonstration of  scattered areas of subcentimeter nodularity within the subcutaneous fat of the abdominal wall, similar to prior. Osseous structures within normal limits. IMPRESSION: 1. No acute abdominopelvic findings. 2. Markedly distended urinary bladder above the level of the umbilicus. Correlate for urinary retention. 3. Fecalization of distal small bowel contents suggesting slow transit. No evidence of bowel obstruction. 4. Hepatomegaly. Electronically Signed   By: Davina Poke D.O.   On: 11/04/2021 16:03  ? ?DG Chest 2 View ? ?Result Date: 11/11/2021 ?CLINICAL DATA:  Chest pain. EXAM: CHEST - 2 VIEW COMPARISON:  Nov 04, 2021 FINDINGS: The heart size and mediastinal contours are wit

## 2021-11-13 NOTE — Progress Notes (Signed)
Inpatient Diabetes Program Recommendations ? ?AACE/ADA: New Consensus Statement on Inpatient Glycemic Control (2015) ? ?Target Ranges:  Prepandial:   less than 140 mg/dL ?     Peak postprandial:   less than 180 mg/dL (1-2 hours) ?     Critically ill patients:  140 - 180 mg/dL  ? ?Lab Results  ?Component Value Date  ? GLUCAP 151 (H) 11/13/2021  ? HGBA1C 8.8 (H) 08/13/2021  ? ? ?Review of Glycemic Control ? ?Diabetes history: DM type 1 ?Outpatient Diabetes medications: Novolog SSI tid + meal coverage, Lantus 40 units bid ?Current orders for Inpatient glycemic control:  ?Semglee 44 units bid ?Novolog 0-20 units tid + hs ?Novolog 4 units tid meal coverage ? ?Spoke with pt at bedside briefly, he wanted to rest. Pt reports having plenty of Lantus insulin but needs Novolog and diabetic testing supplies at home. Looking at Pt's AVS he has 3 refills of pen needles left ? ? ?D/c supplies needed for Diabetes: ?Novolog Flexpen order # [id number] ?Freestyle Test strips order # [id number] ?Generic lancets order # [id number] ? ?Thanks, ? ?Christena Deem RN, MSN, BC-ADM ?Inpatient Diabetes Coordinator ?Team Pager [phone] (8a-5p) ?

## 2021-11-13 NOTE — Plan of Care (Signed)

## 2021-11-14 ENCOUNTER — Ambulatory Visit (HOSPITAL_COMMUNITY)
Admission: EM | Admit: 2021-11-14 | Discharge: 2021-11-14 | Disposition: A | Discharge status: Home / Self Care | Payer: No Payment, Other | Attending: Behavioral Health | Admitting: Behavioral Health

## 2021-11-14 DIAGNOSIS — F1110 Opioid abuse, uncomplicated: Secondary | ICD-10-CM | POA: Insufficient documentation

## 2021-11-14 DIAGNOSIS — F17200 Nicotine dependence, unspecified, uncomplicated: Secondary | ICD-10-CM | POA: Insufficient documentation

## 2021-11-14 DIAGNOSIS — R443 Hallucinations, unspecified: Secondary | ICD-10-CM

## 2021-11-14 DIAGNOSIS — E1010 Type 1 diabetes mellitus with ketoacidosis without coma: Secondary | ICD-10-CM | POA: Diagnosis not present

## 2021-11-14 DIAGNOSIS — F259 Schizoaffective disorder, unspecified: Secondary | ICD-10-CM | POA: Insufficient documentation

## 2021-11-14 DIAGNOSIS — F1510 Other stimulant abuse, uncomplicated: Secondary | ICD-10-CM | POA: Insufficient documentation

## 2021-11-14 DIAGNOSIS — G40909 Epilepsy, unspecified, not intractable, without status epilepticus: Secondary | ICD-10-CM | POA: Insufficient documentation

## 2021-11-14 DIAGNOSIS — R45851 Suicidal ideations: Secondary | ICD-10-CM | POA: Diagnosis not present

## 2021-11-14 DIAGNOSIS — B1920 Unspecified viral hepatitis C without hepatic coma: Secondary | ICD-10-CM | POA: Insufficient documentation

## 2021-11-14 DIAGNOSIS — Z5900 Homelessness unspecified: Secondary | ICD-10-CM | POA: Insufficient documentation

## 2021-11-14 DIAGNOSIS — Z794 Long term (current) use of insulin: Secondary | ICD-10-CM | POA: Insufficient documentation

## 2021-11-14 NOTE — BH Assessment (Signed)
Patient presents to the [facility] seeking help for his depression and suicidal ideation.  Patient states that his mother committed suicide in front of him and his family in April and he states that he has not been able to get overit.  He states that four days ago that he was going to shoot and kill himself, but he states that his sister intervened and took the gun away from him.  He states that he continues to be suicidal and states that he was going to overdose on Thorazine today, but again, his sister intervened.  Patient denies HI, he states that he hears his mother shooting herself over and over again.  He states that he is hearing voices telling him to kill himself. Patient has a histroy of methamphetamine use, last use was two days ago.  Patient states that he feels like he needs to be in the hospital.  Patient is urgent. 

## 2021-11-14 NOTE — Progress Notes (Signed)
Patient presents to the [facility] seeking help for his depression and suicidal ideation.  Patient states that his mother committed suicide in front of him and his family in April and he states that he has not been able to get overit.  He states that four days ago that he was going to shoot and kill himself, but he states that his sister intervened and took the gun away from him.  He states that he continues to be suicidal and states that he was going to overdose on Thorazine today, but again, his sister intervened.  Patient denies HI, he states that he hears his mother shooting herself over and over again.  He states that he is hearing voices telling him to kill himself. Patient has a histroy of methamphetamine use, last use was two days ago.  Patient states that he feels like he needs to be in the hospital.  Patient is urgent.

## 2021-11-14 NOTE — Discharge Instructions (Addendum)
Discharge recommendations:  Patient is to take medications as prescribed.  Please see information for follow-up appointment with psychiatry and therapy.  [facility] [phone] for availability.  [place] for availability [phone] See list of resources for substance use treatment.  Please follow up with your primary care provider for all medical related needs.   Therapy: We recommend that patient participate in individual therapy to address mental health concerns.  Medications: The parent/guardian is to contact a medical professional and/or outpatient provider to address any new side effects that develop. Parent/guardian should update outpatient providers of any new medications and/or medication changes.   Atypical antipsychotics: If you are prescribed an atypical antipsychotic, it is recommended that your height, weight, BMI, blood pressure, fasting lipid panel, and fasting blood sugar be monitored by your outpatient providers.  Safety:  The patient should abstain from use of illicit substances/drugs and abuse of any medications. If symptoms worsen or do not continue to improve or if the patient becomes actively suicidal or homicidal then it is recommended that the patient return to the closest hospital emergency department, the [facility], or call 911 for further evaluation and treatment. National Suicide Prevention Lifeline 1-800-SUICIDE or (650)734-6901.  About 988 988 offers 24/7 access to trained crisis counselors who can help people experiencing mental health-related distress. People can call or text 988 or chat 988lifeline.org for themselves or if they are worried about a loved one who may need crisis support.

## 2021-11-14 NOTE — ED Provider Notes (Addendum)
Behavioral Health Urgent Care Medical Screening Exam  Patient Name: [name] [name]. MRN: [id number] Date of Evaluation: 11/14/21 Diagnosis:  Final diagnoses:  Hallucinations  Suicidal ideation    History of Present illness: [name] [name]. is a 21 y.o. male patient who presents to the [facility] behavioral health urgent care voluntary as a walk-in unaccompanied with a chief complaint of depression and suicidal ideations.   Per chart review, diagnosis list should reflect:  History of schizoaffective disorder bipolar type, GAD, PTSD, h/o borderline PD, h/o antispical PD, h/o ADHD. Stimulant use disorder. Opiate use disorder. Nicotine use disorder.  Epilepsy, hepatitis C   Patient seen and evaluated face-to-face by this provider, chart reviewed and case discussed with Dr. Serafina Mitchell.   Patient presented reporting suicidal ideations and depression. He says tells this provider that he needs to go inpatient for mental health treatment. I discussed with the patient that he was seen at [facility] by psychiatry and recommended for substance use treatment. Patient states that he is unable to get in to [facility] because he was disqualified for history of assault charges. He states that he was supposed to follow-up with [facility] but did not have a transportation to go to [facility]. I discussed with the patient that he denied [facility] yesterday at [facility]. Patient states that he lied in order to get treatment. He states, "what do I have to do to go inpatient, shoot myself in the head."  Patient endorses auditory hallucinations, he states that it is hard for him to explain. He describes the voices his deceased mother voice, sirens and to kill himself. He denies visual hallucinations. There is no objective evidence that the patient is currently responding to internal or external stimuli or experiencing any delusional or paranoid thought content. Patient reports using meth. He  states that he last used meth two days ago. He denies drinking alcohol.   The patient states that he has no family support and that he is currently currently homeless.  He states that he receives outpatient psychiatric services with [facility] and that he is currently working with the [facility] to get housing. I discussed with the patient following up with the ACT Team for medication management and support and therapy. I discussed with the patient contacting  [facility] in [place] for availability and or the [facility] for availability for shelter/housing and substance use treatment. Patient verbalizes understanding. He states that he will call his friend Shanon Brow to see if he can take him to [facility]. Patient requested a bus pass for transportation to his friend Shanon Brow house.    Per chart review on 11/13/2021, per EDP note- "20yo with a history of DM 1, schizoaffective disorder, polysubstance abuse, seizure disorder, and homelessness who was admitted to the hospital May 8 through 12, 2023 for DKA.  He reportedly smoked methamphetamine the morning of his admission, and then later in the day presented to behavioral health reporting that he was suicidal.  There he was found to have markedly elevated CBG and was sent to the ED.  In the ED he denied nausea and vomiting but did not endorse right lower quadrant abdominal pain along with significant hunger.  He was started on insulin drip for DKA.  Patient was also found to be in partial small bowel obstruction as per CT scan was resulted spontaneously.  Diabetic coordinator  was following.  He was also seen by psychiatry during this hospitalization, no evidence  of suicidal or homicidal tendency.  Medically stable for discharge today.  Per TOC note on 11/12/21. CSW met with pt regarding residential substance abuse options. Pt reports he is homeless but has actually been staying in hotels in [place] with his sister. Address  listed on facesheet is where he still gets mail.  Pt does want to pursue residential substance abuse treatment.  CSW brought resource list and went over it with pt.  Pt does have mail with his name and a [place] address on it from when he was staying with his father, aware that he needs this for admission to [facility].  Pt is familiar with both [facility] and [facility] residential programs. Pt has been in touch with [facility] in the past and they asked about criminal history--pt reports he does have assault charges from when he was 40 that do show on his adult criminal record check. Discussed that he will have to talk with admissions at [facility] to see if these will prevent him from being admitted. Pt will contact both places by phone today to pursue admission appts.  Pt confirms he does have working phone. Also on the resource list are other [facility] locations.  Also on resource list is [facility] and pt did sound interested in this, CSW explained this program and encouraged pt to follow up. Pt went to [facility] prior to his admission here seeking treatment, we discussed that they only provide outpt treatment at [facility]. Pt would like to return there, CSW offered bus pass but pt reports he is also banned from [place] buses and he is fine to go there on foot. Pt verbalized understanding of his treatment options.  Patient was admitted to [facility] on 10/21/21-10/23/21 for acute stress reaction, homelessness, tourette's dx abd and DM.  Patient was admitted to [facility] from 08/12/21-08/16/21 "He presented as a voluntary walk-in to the behavioral health hospital complaining of multiple aborted suicide attempt and suicidal ideation with auditory and visual hallucinations. He is admitted to the behavioral health hospital on a voluntary basis."  Upon detailed history, the patient has various encounters with similar presentations and lack of compliance with psychiatric evaluation, treatment regimen and  follow up care. Patient's suicidality is conditioned on obtaining housing/shelter  and the patient appears to have secondary gain due to homelessness. Patient shows a desire to live AEB seeking housing resources. Patient can benefit from following up with outpatient psychiatric services. He has established services with [facility] ACT Team to help reduce reoccurring hospitalizations.   Psychiatric Specialty Exam  Presentation  General Appearance:Appropriate for Environment  Eye Contact:Fair  Speech:Clear and Coherent  Speech Volume:Normal  Handedness:Left   Mood and Affect  Mood:Dysphoric  Affect:Congruent   Thought Process  Thought Processes:Coherent; Goal Directed  Descriptions of Associations:Intact  Orientation:Full (Time, Place and Person)  Thought Content:Logical  Diagnosis of Schizophrenia or Schizoaffective disorder in past: Yes  Duration of Psychotic Symptoms: Greater than six months  Hallucinations:Auditory "see and hear his deceased mom "see and hear his deceased mom  Ideas of Reference:None  Suicidal Thoughts:Yes, Active With Plan  Homicidal Thoughts:No   [place]; Remote Fair; Recent Fair  Judgment:Intact  Insight:Present   Executive Functions  Concentration:Fair  Attention Span:Fair  [place]   Psychomotor Activity  Psychomotor Activity:Normal   Assets  Assets:Communication Skills; Desire for Improvement; Physical Health; Leisure Time   Sleep  Sleep:Poor  Number of hours: 3   No data  recorded  Physical Exam: Physical Exam HENT:     Head: Normocephalic.     Nose: Nose normal.  Eyes:     Conjunctiva/sclera: Conjunctivae normal.  Cardiovascular:     Rate and Rhythm: Normal rate.  Pulmonary:     Effort: Pulmonary effort is normal.  Musculoskeletal:        General: Normal range of motion.     Cervical back: Normal range of motion.   Neurological:     Mental Status: He is alert and oriented to person, place, and time.     Comments: Motor tics   Review of Systems  Constitutional: Negative.   HENT: Negative.    Eyes: Negative.   Respiratory: Negative.    Cardiovascular: Negative.   Gastrointestinal: Negative.   Genitourinary: Negative.   Musculoskeletal: Negative.   Skin: Negative.   Neurological: Negative.   Endo/Heme/Allergies: Negative.   Blood pressure 129/84, pulse 100, temperature 98.1 F (36.7 C), temperature source Oral, resp. rate 18, SpO2 100 %. There is no height or weight on file to calculate BMI.  Musculoskeletal: Strength & Muscle Tone: within normal limits Gait & Station: normal Patient leans: N/A   [place] [facility] Discharge Disposition for Follow up and Recommendations: Based on my evaluation the patient does not appear to have an emergency medical condition and can be discharged with resources and follow up care in outpatient services for Medication Management, Individual Therapy, and Group Therapy Discharge recommendations:  Patient is to take medications as prescribed.  Please see information for follow-up appointment with psychiatry and therapy. [place] [phone] for availability.  [place] for availability [phone] See list of resources for substance use treatment.  Please follow up with your primary care provider for all medical related needs.   Therapy: We recommend that patient participate in individual therapy to address mental health concerns.  Medications: The parent/guardian is to contact a medical professional and/or outpatient provider to address any new side effects that develop. Parent/guardian should update outpatient providers of any new medications and/or medication changes.   Atypical antipsychotics: If you are prescribed an atypical antipsychotic, it is recommended that your height, weight, BMI,  blood pressure, fasting lipid panel, and fasting blood sugar be monitored by your outpatient providers.  Safety:  The patient should abstain from use of illicit substances/drugs and abuse of any medications. If symptoms worsen or do not continue to improve or if the patient becomes actively suicidal or homicidal then it is recommended that the patient return to the closest hospital emergency department, the [facility], or call 911 for further evaluation and treatment. National Suicide Prevention Lifeline 1-800-SUICIDE or 269-190-5833.  About 988 988 offers 24/7 access to trained crisis counselors who can help people experiencing mental health-related distress. People can call or text 988 or chat 988lifeline.org for themselves or if they are worried about a loved one who may need crisis support.    Follow-up Information     Schedule an appointment as soon as possible for a visit  with [facility].   Contact information: [place] [address] [place] [address] [phone]                 Marissa Calamity, NP 11/14/2021, 2:38 PM
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
## 2021-11-17 ENCOUNTER — Other Ambulatory Visit: Payer: Self-pay

## 2021-11-17 ENCOUNTER — Inpatient Hospital Stay (HOSPITAL_COMMUNITY)
Admission: EM | Admit: 2021-11-17 | Discharge: 2021-11-20 | DRG: 638 | Disposition: A | Discharge status: Home / Self Care | Payer: Medicaid Other | Attending: Internal Medicine | Admitting: Internal Medicine

## 2021-11-17 ENCOUNTER — Encounter (HOSPITAL_COMMUNITY): Payer: Self-pay

## 2021-11-17 DIAGNOSIS — E1010 Type 1 diabetes mellitus with ketoacidosis without coma: Secondary | ICD-10-CM | POA: Diagnosis present

## 2021-11-17 DIAGNOSIS — Z91013 Allergy to seafood: Secondary | ICD-10-CM

## 2021-11-17 DIAGNOSIS — R45851 Suicidal ideations: Secondary | ICD-10-CM | POA: Diagnosis present

## 2021-11-17 DIAGNOSIS — Z5320 Procedure and treatment not carried out because of patient's decision for unspecified reasons: Secondary | ICD-10-CM | POA: Diagnosis not present

## 2021-11-17 DIAGNOSIS — F250 Schizoaffective disorder, bipolar type: Secondary | ICD-10-CM

## 2021-11-17 DIAGNOSIS — B182 Chronic viral hepatitis C: Secondary | ICD-10-CM | POA: Diagnosis present

## 2021-11-17 DIAGNOSIS — Z825 Family history of asthma and other chronic lower respiratory diseases: Secondary | ICD-10-CM | POA: Diagnosis not present

## 2021-11-17 DIAGNOSIS — E109 Type 1 diabetes mellitus without complications: Secondary | ICD-10-CM | POA: Diagnosis not present

## 2021-11-17 DIAGNOSIS — Z609 Problem related to social environment, unspecified: Secondary | ICD-10-CM | POA: Diagnosis present

## 2021-11-17 DIAGNOSIS — Z83438 Family history of other disorder of lipoprotein metabolism and other lipidemia: Secondary | ICD-10-CM

## 2021-11-17 DIAGNOSIS — F913 Oppositional defiant disorder: Secondary | ICD-10-CM | POA: Diagnosis not present

## 2021-11-17 DIAGNOSIS — F952 Tourette's disorder: Secondary | ICD-10-CM | POA: Diagnosis present

## 2021-11-17 DIAGNOSIS — Z8249 Family history of ischemic heart disease and other diseases of the circulatory system: Secondary | ICD-10-CM | POA: Diagnosis not present

## 2021-11-17 DIAGNOSIS — N179 Acute kidney failure, unspecified: Secondary | ICD-10-CM | POA: Diagnosis present

## 2021-11-17 DIAGNOSIS — Z91030 Bee allergy status: Secondary | ICD-10-CM | POA: Diagnosis not present

## 2021-11-17 DIAGNOSIS — R1013 Epigastric pain: Secondary | ICD-10-CM

## 2021-11-17 DIAGNOSIS — R109 Unspecified abdominal pain: Secondary | ICD-10-CM | POA: Diagnosis present

## 2021-11-17 DIAGNOSIS — Z833 Family history of diabetes mellitus: Secondary | ICD-10-CM | POA: Diagnosis not present

## 2021-11-17 DIAGNOSIS — F1510 Other stimulant abuse, uncomplicated: Secondary | ICD-10-CM

## 2021-11-17 DIAGNOSIS — F32A Depression, unspecified: Secondary | ICD-10-CM | POA: Diagnosis present

## 2021-11-17 DIAGNOSIS — Z9151 Personal history of suicidal behavior: Secondary | ICD-10-CM | POA: Diagnosis not present

## 2021-11-17 DIAGNOSIS — Z794 Long term (current) use of insulin: Secondary | ICD-10-CM | POA: Diagnosis not present

## 2021-11-17 DIAGNOSIS — D573 Sickle-cell trait: Secondary | ICD-10-CM | POA: Diagnosis present

## 2021-11-17 DIAGNOSIS — F17210 Nicotine dependence, cigarettes, uncomplicated: Secondary | ICD-10-CM | POA: Diagnosis present

## 2021-11-17 DIAGNOSIS — F1910 Other psychoactive substance abuse, uncomplicated: Secondary | ICD-10-CM | POA: Diagnosis present

## 2021-11-17 DIAGNOSIS — Z91199 Patient's noncompliance with other medical treatment and regimen due to unspecified reason: Secondary | ICD-10-CM | POA: Diagnosis not present

## 2021-11-17 DIAGNOSIS — G40909 Epilepsy, unspecified, not intractable, without status epilepticus: Secondary | ICD-10-CM | POA: Diagnosis present

## 2021-11-17 DIAGNOSIS — F603 Borderline personality disorder: Secondary | ICD-10-CM | POA: Diagnosis present

## 2021-11-17 DIAGNOSIS — E1110 Type 2 diabetes mellitus with ketoacidosis without coma: Secondary | ICD-10-CM | POA: Diagnosis present

## 2021-11-17 DIAGNOSIS — F988 Other specified behavioral and emotional disorders with onset usually occurring in childhood and adolescence: Secondary | ICD-10-CM | POA: Diagnosis present

## 2021-11-17 DIAGNOSIS — Z91018 Allergy to other foods: Secondary | ICD-10-CM

## 2021-11-17 DIAGNOSIS — F259 Schizoaffective disorder, unspecified: Secondary | ICD-10-CM | POA: Diagnosis present

## 2021-11-17 DIAGNOSIS — F329 Major depressive disorder, single episode, unspecified: Secondary | ICD-10-CM | POA: Diagnosis not present

## 2021-11-17 DIAGNOSIS — Z5900 Homelessness unspecified: Secondary | ICD-10-CM | POA: Diagnosis not present

## 2021-11-17 DIAGNOSIS — E875 Hyperkalemia: Secondary | ICD-10-CM | POA: Diagnosis present

## 2021-11-17 DIAGNOSIS — Z79899 Other long term (current) drug therapy: Secondary | ICD-10-CM

## 2021-11-17 LAB — CBC WITH DIFFERENTIAL/PLATELET
Abs Immature Granulocytes: 0.16 10*3/uL — ABNORMAL HIGH (ref 0.00–0.07)
Basophils Absolute: 0.1 10*3/uL (ref 0.0–0.1)
Basophils Relative: 1 %
Eosinophils Absolute: 0 10*3/uL (ref 0.0–0.5)
Eosinophils Relative: 0 %
HCT: 44.7 % (ref 39.0–52.0)
Hemoglobin: 15.4 g/dL (ref 13.0–17.0)
Immature Granulocytes: 2 %
Lymphocytes Relative: 18 %
Lymphs Abs: 1.6 10*3/uL (ref 0.7–4.0)
MCH: 28.7 pg (ref 26.0–34.0)
MCHC: 34.5 g/dL (ref 30.0–36.0)
MCV: 83.2 fL (ref 80.0–100.0)
Monocytes Absolute: 0.5 10*3/uL (ref 0.1–1.0)
Monocytes Relative: 6 %
Neutro Abs: 6.4 10*3/uL (ref 1.7–7.7)
Neutrophils Relative %: 73 %
Platelets: 366 10*3/uL (ref 150–400)
RBC: 5.37 MIL/uL (ref 4.22–5.81)
RDW: 13.3 % (ref 11.5–15.5)
WBC: 8.8 10*3/uL (ref 4.0–10.5)
nRBC: 0 % (ref 0.0–0.2)

## 2021-11-17 LAB — BASIC METABOLIC PANEL
Anion gap: 27 — ABNORMAL HIGH (ref 5–15)
BUN: 24 mg/dL — ABNORMAL HIGH (ref 6–20)
CO2: 8 mmol/L — ABNORMAL LOW (ref 22–32)
Calcium: 9.6 mg/dL (ref 8.9–10.3)
Chloride: 91 mmol/L — ABNORMAL LOW (ref 98–111)
Creatinine, Ser: 1.22 mg/dL (ref 0.61–1.24)
GFR, Estimated: >60 mL/min (ref >60)
Glucose, Bld: 709 mg/dL (ref 70–99)
Potassium: 5.3 mmol/L — ABNORMAL HIGH (ref 3.5–5.1)
Sodium: 126 mmol/L — ABNORMAL LOW (ref 135–145)

## 2021-11-17 LAB — BLOOD GAS, VENOUS
Acid-base deficit: 15.1 mmol/L — ABNORMAL HIGH (ref 0.0–2.0)
Bicarbonate: 9.1 mmol/L — ABNORMAL LOW (ref 20.0–28.0)
O2 Saturation: 99 %
Patient temperature: 37.1
pCO2, Ven: 19 mmHg — CL (ref 44–60)
pH, Ven: 7.29 (ref 7.25–7.43)
pO2, Ven: 99 mmHg — ABNORMAL HIGH (ref 32–45)

## 2021-11-17 LAB — URINALYSIS, ROUTINE W REFLEX MICROSCOPIC
Bacteria, UA: NONE SEEN
Bilirubin Urine: NEGATIVE
Glucose, UA: >=500 mg/dL — AB
Hgb urine dipstick: NEGATIVE
Ketones, ur: 80 mg/dL — AB
Leukocytes,Ua: NEGATIVE
Nitrite: NEGATIVE
Protein, ur: NEGATIVE mg/dL
Specific Gravity, Urine: 1.018 (ref 1.005–1.030)
pH: 5 (ref 5.0–8.0)

## 2021-11-17 LAB — CBG MONITORING, ED
Glucose-Capillary: >600 mg/dL (ref 70–99)
Glucose-Capillary: 347 mg/dL — ABNORMAL HIGH (ref 70–99)

## 2021-11-17 MED ORDER — DEXTROSE IN LACTATED RINGERS 5 % IV SOLN: INTRAVENOUS | Status: DC

## 2021-11-17 MED ORDER — LACTATED RINGERS IV SOLN: INTRAVENOUS | Status: DC

## 2021-11-17 MED ORDER — ONDANSETRON HCL 4 MG/2ML IJ SOLN
4.0000 mg | Freq: Once | INTRAMUSCULAR | Status: AC
  Administered 2021-11-17: 4 mg via INTRAVENOUS
  Filled 2021-11-17: qty 2

## 2021-11-17 MED ORDER — INSULIN REGULAR(HUMAN) IN NACL 100-0.9 UT/100ML-% IV SOLN
INTRAVENOUS | Status: DC
Start: 2021-11-17 — End: 2021-11-17
  Administered 2021-11-17: 10 [IU]/h via INTRAVENOUS
  Filled 2021-11-17: qty 100

## 2021-11-17 MED ORDER — LACTATED RINGERS IV BOLUS
20.0000 mL/kg | Freq: Once | INTRAVENOUS | Status: AC
  Administered 2021-11-17: 1360 mL via INTRAVENOUS

## 2021-11-17 MED ORDER — INSULIN REGULAR(HUMAN) IN NACL 100-0.9 UT/100ML-% IV SOLN
INTRAVENOUS | Status: DC
  Administered 2021-11-18: 6.5 [IU]/h via INTRAVENOUS
  Filled 2021-11-17: qty 100

## 2021-11-17 MED ORDER — ENOXAPARIN SODIUM 40 MG/0.4ML IJ SOSY
40.0000 mg | PREFILLED_SYRINGE | INTRAMUSCULAR | Status: DC
  Filled 2021-11-17 (×3): qty 0.4

## 2021-11-17 MED ORDER — MORPHINE SULFATE (PF) 4 MG/ML IV SOLN
4.0000 mg | Freq: Once | INTRAVENOUS | Status: AC
  Administered 2021-11-17: 4 mg via INTRAVENOUS
  Filled 2021-11-17: qty 1

## 2021-11-17 MED ORDER — CHLORHEXIDINE GLUCONATE CLOTH 2 % EX PADS
6.0000 | MEDICATED_PAD | Freq: Every day | CUTANEOUS | Status: DC
  Administered 2021-11-18 – 2021-11-20 (×3): 6 via TOPICAL

## 2021-11-17 MED ORDER — DEXTROSE 50 % IV SOLN: 0.0000 mL | INTRAVENOUS | Status: DC | PRN

## 2021-11-17 MED ORDER — ORAL CARE MOUTH RINSE
15.0000 mL | Freq: Two times a day (BID) | OROMUCOSAL | Status: DC
  Administered 2021-11-20: 15 mL via OROMUCOSAL
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
## 2021-11-18 ENCOUNTER — Inpatient Hospital Stay (HOSPITAL_COMMUNITY): Payer: Medicaid Other

## 2021-11-18 DIAGNOSIS — E1010 Type 1 diabetes mellitus with ketoacidosis without coma: Secondary | ICD-10-CM | POA: Diagnosis not present

## 2021-11-18 DIAGNOSIS — E875 Hyperkalemia: Secondary | ICD-10-CM | POA: Diagnosis not present

## 2021-11-18 DIAGNOSIS — F1510 Other stimulant abuse, uncomplicated: Secondary | ICD-10-CM | POA: Diagnosis not present

## 2021-11-18 DIAGNOSIS — N179 Acute kidney failure, unspecified: Secondary | ICD-10-CM | POA: Diagnosis not present

## 2021-11-18 LAB — BASIC METABOLIC PANEL
Anion gap: 15 (ref 5–15)
Anion gap: 9 (ref 5–15)
Anion gap: 7 (ref 5–15)
Anion gap: 9 (ref 5–15)
Anion gap: 13 (ref 5–15)
BUN: 15 mg/dL (ref 6–20)
BUN: 14 mg/dL (ref 6–20)
BUN: 11 mg/dL (ref 6–20)
BUN: 14 mg/dL (ref 6–20)
BUN: 17 mg/dL (ref 6–20)
CO2: 16 mmol/L — ABNORMAL LOW (ref 22–32)
CO2: 23 mmol/L (ref 22–32)
CO2: 24 mmol/L (ref 22–32)
CO2: 23 mmol/L (ref 22–32)
CO2: 19 mmol/L — ABNORMAL LOW (ref 22–32)
Calcium: 9.2 mg/dL (ref 8.9–10.3)
Calcium: 9 mg/dL (ref 8.9–10.3)
Calcium: 8.6 mg/dL — ABNORMAL LOW (ref 8.9–10.3)
Calcium: 8.6 mg/dL — ABNORMAL LOW (ref 8.9–10.3)
Calcium: 8.3 mg/dL — ABNORMAL LOW (ref 8.9–10.3)
Chloride: 103 mmol/L (ref 98–111)
Chloride: 104 mmol/L (ref 98–111)
Chloride: 104 mmol/L (ref 98–111)
Chloride: 103 mmol/L (ref 98–111)
Chloride: 98 mmol/L (ref 98–111)
Creatinine, Ser: 0.87 mg/dL (ref 0.61–1.24)
Creatinine, Ser: 0.61 mg/dL (ref 0.61–1.24)
Creatinine, Ser: 0.53 mg/dL — ABNORMAL LOW (ref 0.61–1.24)
Creatinine, Ser: 0.66 mg/dL (ref 0.61–1.24)
Creatinine, Ser: 0.9 mg/dL (ref 0.61–1.24)
GFR, Estimated: >60 mL/min (ref >60)
GFR, Estimated: >60 mL/min (ref >60)
GFR, Estimated: >60 mL/min (ref >60)
GFR, Estimated: >60 mL/min (ref >60)
GFR, Estimated: >60 mL/min (ref >60)
Glucose, Bld: 244 mg/dL — ABNORMAL HIGH (ref 70–99)
Glucose, Bld: 187 mg/dL — ABNORMAL HIGH (ref 70–99)
Glucose, Bld: 130 mg/dL — ABNORMAL HIGH (ref 70–99)
Glucose, Bld: 378 mg/dL — ABNORMAL HIGH (ref 70–99)
Glucose, Bld: 419 mg/dL — ABNORMAL HIGH (ref 70–99)
Potassium: 3.8 mmol/L (ref 3.5–5.1)
Potassium: 3.7 mmol/L (ref 3.5–5.1)
Potassium: 3.7 mmol/L (ref 3.5–5.1)
Potassium: 4.1 mmol/L (ref 3.5–5.1)
Potassium: 5 mmol/L (ref 3.5–5.1)
Sodium: 134 mmol/L — ABNORMAL LOW (ref 135–145)
Sodium: 136 mmol/L (ref 135–145)
Sodium: 135 mmol/L (ref 135–145)
Sodium: 135 mmol/L (ref 135–145)
Sodium: 130 mmol/L — ABNORMAL LOW (ref 135–145)

## 2021-11-18 LAB — HEPATIC FUNCTION PANEL
ALT: 236 U/L — ABNORMAL HIGH (ref 0–44)
ALT: 226 U/L — ABNORMAL HIGH (ref 0–44)
AST: 178 U/L — ABNORMAL HIGH (ref 15–41)
AST: 193 U/L — ABNORMAL HIGH (ref 15–41)
Albumin: 3.9 g/dL (ref 3.5–5.0)
Albumin: 3.6 g/dL (ref 3.5–5.0)
Alkaline Phosphatase: 109 U/L (ref 38–126)
Alkaline Phosphatase: 102 U/L (ref 38–126)
Bilirubin, Direct: 0.2 mg/dL (ref 0.0–0.2)
Bilirubin, Direct: 0.2 mg/dL (ref 0.0–0.2)
Indirect Bilirubin: 1.6 mg/dL — ABNORMAL HIGH (ref 0.3–0.9)
Indirect Bilirubin: 0.6 mg/dL (ref 0.3–0.9)
Total Bilirubin: 1.8 mg/dL — ABNORMAL HIGH (ref 0.3–1.2)
Total Bilirubin: 0.8 mg/dL (ref 0.3–1.2)
Total Protein: 7.6 g/dL (ref 6.5–8.1)
Total Protein: 7.2 g/dL (ref 6.5–8.1)

## 2021-11-18 LAB — GLUCOSE, CAPILLARY
Glucose-Capillary: 340 mg/dL — ABNORMAL HIGH (ref 70–99)
Glucose-Capillary: 292 mg/dL — ABNORMAL HIGH (ref 70–99)
Glucose-Capillary: 202 mg/dL — ABNORMAL HIGH (ref 70–99)
Glucose-Capillary: 199 mg/dL — ABNORMAL HIGH (ref 70–99)
Glucose-Capillary: 188 mg/dL — ABNORMAL HIGH (ref 70–99)
Glucose-Capillary: 186 mg/dL — ABNORMAL HIGH (ref 70–99)
Glucose-Capillary: 207 mg/dL — ABNORMAL HIGH (ref 70–99)
Glucose-Capillary: 142 mg/dL — ABNORMAL HIGH (ref 70–99)
Glucose-Capillary: 146 mg/dL — ABNORMAL HIGH (ref 70–99)
Glucose-Capillary: 276 mg/dL — ABNORMAL HIGH (ref 70–99)
Glucose-Capillary: 280 mg/dL — ABNORMAL HIGH (ref 70–99)
Glucose-Capillary: 533 mg/dL (ref 70–99)
Glucose-Capillary: 237 mg/dL — ABNORMAL HIGH (ref 70–99)
Glucose-Capillary: 252 mg/dL — ABNORMAL HIGH (ref 70–99)

## 2021-11-18 LAB — LIPASE, BLOOD: Lipase: 23 U/L (ref 11–51)

## 2021-11-18 LAB — BETA-HYDROXYBUTYRIC ACID
Beta-Hydroxybutyric Acid: 3.56 mmol/L — ABNORMAL HIGH (ref 0.05–0.27)
Beta-Hydroxybutyric Acid: 1.09 mmol/L — ABNORMAL HIGH (ref 0.05–0.27)

## 2021-11-18 MED ORDER — CHLORPROMAZINE HCL 10 MG PO TABS
10.0000 mg | ORAL_TABLET | Freq: Two times a day (BID) | ORAL | Status: DC
  Administered 2021-11-18 – 2021-11-19 (×3): 10 mg via ORAL
  Filled 2021-11-18 (×7): qty 1

## 2021-11-18 MED ORDER — ONDANSETRON HCL 4 MG/2ML IJ SOLN
4.0000 mg | Freq: Four times a day (QID) | INTRAMUSCULAR | Status: DC | PRN
  Administered 2021-11-18: 4 mg via INTRAVENOUS
  Filled 2021-11-18: qty 2

## 2021-11-18 MED ORDER — INSULIN ASPART 100 UNIT/ML IJ SOLN
7.0000 [IU] | Freq: Once | INTRAMUSCULAR | Status: AC
  Administered 2021-11-18: 7 [IU] via SUBCUTANEOUS

## 2021-11-18 MED ORDER — INSULIN ASPART 100 UNIT/ML IJ SOLN
8.0000 [IU] | Freq: Three times a day (TID) | INTRAMUSCULAR | Status: DC
  Administered 2021-11-18 – 2021-11-19 (×3): 8 [IU] via SUBCUTANEOUS

## 2021-11-18 MED ORDER — INSULIN GLARGINE-YFGN 100 UNIT/ML ~~LOC~~ SOLN
30.0000 [IU] | Freq: Two times a day (BID) | SUBCUTANEOUS | Status: DC
  Filled 2021-11-18: qty 0.3

## 2021-11-18 MED ORDER — INSULIN GLARGINE-YFGN 100 UNIT/ML ~~LOC~~ SOLN
14.0000 [IU] | Freq: Two times a day (BID) | SUBCUTANEOUS | Status: DC
  Administered 2021-11-18: 14 [IU] via SUBCUTANEOUS
  Filled 2021-11-18 (×2): qty 0.14

## 2021-11-18 MED ORDER — OXYCODONE HCL 5 MG PO TABS
5.0000 mg | ORAL_TABLET | ORAL | Status: DC | PRN
  Administered 2021-11-18: 5 mg via ORAL
  Filled 2021-11-18: qty 1

## 2021-11-18 MED ORDER — INSULIN ASPART 100 UNIT/ML IJ SOLN
0.0000 [IU] | Freq: Every day | INTRAMUSCULAR | Status: DC
  Administered 2021-11-18: 2 [IU] via SUBCUTANEOUS

## 2021-11-18 MED ORDER — CHLORPROMAZINE HCL 25 MG PO TABS
25.0000 mg | ORAL_TABLET | Freq: Every day | ORAL | Status: DC
  Administered 2021-11-18: 25 mg via ORAL
  Filled 2021-11-18 (×4): qty 1

## 2021-11-18 MED ORDER — OXYCODONE HCL 5 MG PO TABS
5.0000 mg | ORAL_TABLET | Freq: Four times a day (QID) | ORAL | Status: DC | PRN
  Administered 2021-11-18: 5 mg via ORAL
  Filled 2021-11-18: qty 1

## 2021-11-18 MED ORDER — DICYCLOMINE HCL 10 MG PO CAPS
10.0000 mg | ORAL_CAPSULE | Freq: Once | ORAL | Status: AC
  Administered 2021-11-19: 10 mg via ORAL
  Filled 2021-11-18: qty 1

## 2021-11-18 MED ORDER — HALOPERIDOL 1 MG PO TABS
1.0000 mg | ORAL_TABLET | Freq: Once | ORAL | Status: AC
  Administered 2021-11-18: 1 mg via ORAL
  Filled 2021-11-18: qty 1

## 2021-11-18 MED ORDER — OXYCODONE HCL 5 MG PO TABS
5.0000 mg | ORAL_TABLET | Freq: Four times a day (QID) | ORAL | Status: DC | PRN
  Administered 2021-11-18 – 2021-11-20 (×5): 10 mg via ORAL
  Filled 2021-11-18 (×5): qty 2

## 2021-11-18 MED ORDER — INSULIN ASPART 100 UNIT/ML IJ SOLN
4.0000 [IU] | Freq: Three times a day (TID) | INTRAMUSCULAR | Status: DC
  Administered 2021-11-18: 4 [IU] via SUBCUTANEOUS

## 2021-11-18 MED ORDER — MORPHINE SULFATE (PF) 2 MG/ML IV SOLN
1.0000 mg | Freq: Once | INTRAVENOUS | Status: AC
  Administered 2021-11-18: 1 mg via INTRAVENOUS
  Filled 2021-11-18: qty 1

## 2021-11-18 MED ORDER — INSULIN ASPART 100 UNIT/ML IJ SOLN
0.0000 [IU] | Freq: Three times a day (TID) | INTRAMUSCULAR | Status: DC
  Administered 2021-11-18: 15 [IU] via SUBCUTANEOUS
  Administered 2021-11-18: 8 [IU] via SUBCUTANEOUS

## 2021-11-18 MED ORDER — INSULIN GLARGINE-YFGN 100 UNIT/ML ~~LOC~~ SOLN
40.0000 [IU] | Freq: Two times a day (BID) | SUBCUTANEOUS | Status: DC
Start: 2021-11-18 — End: 2021-11-18

## 2021-11-18 MED ORDER — CARBAMAZEPINE 200 MG PO TABS: 200.0000 mg | ORAL_TABLET | Freq: Two times a day (BID) | ORAL | Status: DC

## 2021-11-18 MED ORDER — INSULIN GLARGINE-YFGN 100 UNIT/ML ~~LOC~~ SOLN
14.0000 [IU] | Freq: Two times a day (BID) | SUBCUTANEOUS | Status: DC
  Filled 2021-11-18: qty 0.14

## 2021-11-18 MED ORDER — INSULIN ASPART 100 UNIT/ML IJ SOLN
3.0000 [IU] | Freq: Once | INTRAMUSCULAR | Status: AC
  Administered 2021-11-18: 3 [IU] via SUBCUTANEOUS

## 2021-11-18 MED ORDER — INSULIN GLARGINE-YFGN 100 UNIT/ML ~~LOC~~ SOLN
40.0000 [IU] | Freq: Two times a day (BID) | SUBCUTANEOUS | Status: DC
Start: 2021-11-18 — End: 2021-11-19
  Administered 2021-11-18: 40 [IU] via SUBCUTANEOUS
  Filled 2021-11-18 (×2): qty 0.4
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
## 2021-11-18 NOTE — Progress Notes (Signed)
Inpatient Diabetes Program Recommendations  AACE/ADA: New Consensus Statement on Inpatient Glycemic Control (2015)  Target Ranges:  Prepandial:   less than 140 mg/dL      Peak postprandial:   less than 180 mg/dL (1-2 hours)      Critically ill patients:  140 - 180 mg/dL   Lab Results  Component Value Date   GLUCAP 142 (H) 11/18/2021   HGBA1C 8.8 (H) 08/13/2021    Review of Glycemic Control  Diabetes history: DM1 (does not make insulin. Needs correction basal and meal coverage)  Outpatient Diabetes medications: Lantus 40 BID, Novolog 8 units TID  Current orders for Inpatient glycemic control: Transitioning to Semglee 14 units BID, Novolog 0-15 TID with meals and 0-5 HS + 4 units TID  HgbA1C - 8.8%  Inpatient Diabetes Program Recommendations:    Increase Semglee to 30 units BID  Decrease Novolog to 0-9 units TID with meals and 0-5 HS  Increase Novolog meal coverage to 8 units TID with meals if eating at least 50%.  Pt appears to be very resistant to insulin. Has required more basal and meal coverage in past hospitalizations.   Continue to follow glucose trends.  Thank you. Demichael Traum, RD, LDN, CDE Inpatient Diabetes Coordinator [phone]   Addendum: Spoke with pt at bedside regarding his DKA, frequent admissions and psych needs. Pt states he went to [facility] for S/I and treatment for meth addiction. Was sent to [facility] for early DKA and uncontrolled DM1. Pt does not understand why he can't get mental health assistance. [facility] to [facility] [place] because they were the only ones that really helped him.  Asked to have diet liberalized. Will ask for larger portions of CHO mod diet. States when using meth, he does not take his insulin. No issues in obtaining insulin. Pt also states he would like to be referred to Endocrinologist.  Will Secure text MD regarding larger portions on CHO mod diet. Agree with need for inpatient psych.  Thank  you. Jayme Mednick, RD, LDN, CDE Inpatient Diabetes Coordinator [phone]      

## 2021-11-18 NOTE — Progress Notes (Signed)
[facility] PROGRESS NOTE    Progress Note  [name] [name].  IRS:[id number] DOB: 27-Feb-2001 DOA: 11/17/2021 PCP: Oneita Hurt, No     Brief Narrative:   [name] [name]. is an 21 y.o. male past medical history of diabetes mellitus type 1, methamphetamine use, chronic hepatitis C schizoaffective disorder with a recent admission with small bowel obstruction and early DKA with suicidal ideation now presents with abdominal pain nausea vomiting and hyperglycemia found to be in DKA.    Assessment/Plan:   Diabetes mellitus type 1 /  DKA (diabetic ketoacidosis) (HCC) Last A1c of 8.8 in February 2023. Blood glucose of 700 bicarb of 9 and a gap of 27. Was started on full resuscitation and IV insulin. Start on long-acting insulin plus sliding scale lower carb modified diet. Discontinue IV insulin and D5. Transfer to MedSurg.  Acute kidney injury/hyperkalemia: With a baseline creatinine of 0.5 likely prerenal azotemia in the setting of DKA and nausea and vomiting. Was started on IV fluids his creatinine has returned to baseline. Hyperkalemia resolved after fluid rehydration and resolve his of his acidosis.  Abdominal pain/elevated LFTs: Likely due to DKA, LFTs are pending this morning. KUB showed no acute findings.  Suicidal ideation/schizoaffective disorder: Has been thinking about shooting himself. He does not have a gun. Does not want to take the Thorazine anymore. He was started on Haldol. Suicide precaution, and psychiatry has been consulted.  Amphetamine use: Injects daily expressing intentions to rule out a rehab program.  Chronic hepatitis C: Follow-up with ID as an outpatient   DVT prophylaxis: lovenox Family Communication:none Status is: Inpatient Remains inpatient appropriate because: DKA    Code Status:     Code Status Orders  (From admission, onward)           Start     Ordered   11/17/21 2350  Full code  Continuous        11/17/21 2350            Code Status History     Date Active Date Inactive Code Status Order ID Comments User Context   11/11/2021 0456 11/13/2021 1752 Full Code [id number]  Hillary Bow, DO ED   11/04/2021 1950 11/08/2021 1902 Full Code [id number]  Alba Cory, MD Inpatient   09/30/2021 0337 10/01/2021 2135 Full Code [id number]  Tomma Lightning, MD ED   08/12/2021 1530 08/15/2021 1729 Full Code [id number]  Maryagnes Amos, FNP Inpatient   12/10/2020 2353 12/11/2020 0519 Full Code [id number]  Howerter, Chaney Born, DO ED   11/10/2020 1542 11/12/2020 1814 Full Code [id number]  Lorin Glass, MD ED   11/10/2020 1531 11/10/2020 1542 Full Code [id number]  Delfin Gant, NP ED   11/09/2020 0552 11/10/2020 0021 Full Code [id number]  Hillary Bow, DO ED   09/14/2020 1302 09/14/2020 1859 Full Code [id number]  Pieter Partridge, MD ED   05/04/2020 0212 05/04/2020 0939 Full Code [id number]  Jackelyn Poling, NP ED   02/18/2020 0529 02/19/2020 0510 Full Code [id number]  Hillary Bow, DO ED   01/15/2020 2130 01/16/2020 1240 Full Code [id number]  Kathlene Cote, PA-C ED   10/05/2019 2119 10/06/2019 2041 Full Code [id number]  Jearld Lesch, NP Inpatient   09/28/2019 1601 09/28/2019 1658 Full Code [id number]  Oneta Rack, NP HOV   07/10/2019 0549 07/10/2019 2041 Full Code [id number]  Liborio Nixon, MD ED   10/26/2018 1514 10/26/2018 2138 Full Code [id number]  Lanelle Bal, MD ED  10/06/2018 1813 10/06/2018 2206 Partial Code [id number]  Yvette Rack, MD ED   09/25/2018 2030 09/26/2018 0501 Full Code [id number]  Yvette Rack, MD ED   09/24/2018 0215 09/24/2018 0556 Full Code [id number]  Eduard Clos, MD ED   09/18/2018 0044 09/19/2018 1413 Full Code [id number]  Paula Libra, MD ED   09/15/2018 1829 09/16/2018 1940 Full Code [id number]  Elgergawy, Leana Roe, MD Inpatient   09/02/2018 1408 09/06/2018 1909 Full Code [id number]  Tyrone Nine, MD Inpatient   08/31/2018 0432 08/31/2018 1837 Full Code [id number]  Lorretta Harp, MD ED   08/29/2018 1652  08/30/2018 0854 Full Code [id number]  Rolly Salter, MD Inpatient   08/18/2018 0828 08/20/2018 1612 Full Code [id number]  Osvaldo Shipper, MD ED   08/09/2018 0323 08/09/2018 2339 Full Code [id number]  Eduard Clos, MD ED   07/14/2018 1728 07/14/2018 2303 Full Code [id number]  Ward, Chase Picket, PA-C ED   04/14/2018 2323 04/19/2018 2143 Full Code [id number]  Elpidio Anis, PA-C ED   04/04/2018 1049 04/05/2018 2218 Full Code [id number]  Myrtie Neither, MD ED   04/02/2018 1311 04/03/2018 0010 Full Code [id number]  Elgergawy, Leana Roe, MD Inpatient   03/11/2018 2143 03/12/2018 1450 Full Code [id number]  Linwood Dibbles, MD ED   02/25/2018 1556 03/09/2018 1839 Full Code [id number]  Rankin, [name], NP Inpatient   02/23/2018 2051 02/25/2018 1511 Full Code [id number]  Alveria Apley, PA-C ED   08/14/2017 2058 08/17/2017 2012 Full Code [id number]  Georgiana Shore, PA-C ED   08/11/2017 2031 08/12/2017 1453 Full Code [id number]  Shaune Pollack, MD ED   07/29/2017 1628 08/08/2017 0830 Full Code [id number]  Laveda Abbe, NP Inpatient   07/24/2017 1954 07/26/2017 1041 Full Code [id number]  Charm Rings, NP Inpatient   07/24/2017 0716 07/24/2017 1923 Full Code [id number]  Pricilla Loveless, MD ED   07/16/2017 2203 07/18/2017 2151 Full Code [id number]  Eduard Clos, MD ED   01/26/2017 1130 01/29/2017 1758 Full Code [id number]  Deno Lunger, MD ED   12/18/2016 1529 12/21/2016 1625 Full Code [id number]  Bobette Mo, MD ED   04/17/2016 1707 05/08/2016 1235 Full Code [id number]  Lyndal Pulley, MD ED   03/10/2015 2117 03/17/2015 0000 Full Code [id number]  Earley Favor, NP ED   02/19/2015 1545 02/26/2015 1952 Full Code [id number]  [place], Katherine Inpatient   02/19/2015 1541 02/19/2015 1545 Full Code [id number]  [place], Katherine Inpatient   08/21/2014 2241 08/26/2014 1507 Full Code [id number]  Joanna Puff, MD Inpatient   04/06/2014 1638 04/13/2014 1627 Full Code [id number]  Chauncey Mann, MD Inpatient   03/31/2014 1643  04/06/2014 1638 Full Code [id number]  Sherrin Daisy, MD ED   03/22/2014 2252 03/31/2014 1643 Full Code [id number]  Antony Madura, PA-C ED   03/03/2014 0502 03/09/2014 1554 Full Code [id number]  Alfredo Bach, RN Inpatient   02/16/2014 1652 02/23/2014 1903 Full Code [id number]  Chauncey Mann, MD Inpatient   02/14/2014 2105 02/16/2014 1652 Full Code [id number]  Nada Boozer, MD Inpatient   02/02/2014 1621 02/06/2014 1901 Full Code [id number]  Birder Robson, MD Inpatient   06/17/2013 2139 06/21/2013 1900 Full Code [id number]  Arley Phenix, MD ED         IV Access:   Peripheral IV   Procedures and diagnostic studies:   DG Abd 1 View  Result Date: 11/18/2021 CLINICAL DATA:  Diabetic ketoacidosis, abdominal pain EXAM: ABDOMEN - 1 VIEW COMPARISON:  None Available. FINDINGS: Nonobstructive  bowel gas pattern. There is diffuse mucosal fold thickening within multiple aerated loops of small bowel within the left upper quadrant suggesting underlying enteritis. Gas is seen within nondilated appendiceal lumen. No free intraperitoneal gas. No organomegaly. No acute bone abnormality. IMPRESSION: Mucosal fold thickening within multiple loops of small bowel within the left upper quadrant in keeping with an underlying enteritis. No obstruction. Electronically Signed   By: Helyn Numbers M.D.   On: 11/18/2021 00:25     Medical Consultants:   None.   Subjective:    [name] [name]. relates he is hungry no complaints still with suicidal ideation.  Objective:    Vitals:   11/18/21 0400 11/18/21 0500 11/18/21 0515 11/18/21 0600  BP: 127/65 112/65  116/66  Pulse:  85 79 81  Resp: Temp:   97.8 F (36.6 C)   TempSrc:   Oral   SpO2: 100% 97% 100% 98%  Weight:      Height:       SpO2: 98 %   Intake/Output Summary (Last 24 hours) at 11/18/2021 0730 Last data filed at 11/18/2021 0650 Gross per 24 hour  Intake 2737.74 ml  Output 2925 ml  Net -187.26 ml   Filed Weights    11/17/21 2100 11/18/21 0113  Weight: 68 kg 72.3 kg    Exam: General exam: In no acute distress. Respiratory system: Good air movement and clear to auscultation. Cardiovascular system: S1 & S2 heard, RRR. No JVD. Gastrointestinal system: Abdomen is nondistended, soft and nontender.  Extremities: No pedal edema. Skin: No rashes, lesions or ulcers Psychiatry: Judgement and insight appear normal. Mood & affect appropriate.    Data Reviewed:    Labs: Basic Metabolic Panel: Recent Labs  Lab 11/12/21 0243 11/13/21 0459 11/17/21 2129 11/18/21 0245 11/18/21 0515  NA 134* 137 126* 134* 136  K 4.0 3.6 5.3* 3.8 3.7  CL 101 107 91* 103 104  CO2 23 22 8* 16* 23  GLUCOSE 293* 193* 709* 244* 187*  BUN 13 13 24* 15 14  CREATININE 0.65 0.50* 1.22 0.87 0.61  CALCIUM 8.6* 8.7* 9.6 9.2 9.0  MG 2.1  --   --   --   --   PHOS 4.8*  --   --   --   --    GFR Estimated Creatinine Clearance: 148.1 mL/min (by C-G formula based on SCr of 0.61 mg/dL). Liver Function Tests: Recent Labs  Lab 11/12/21 0243 11/18/21 0245  AST 290* 178*  ALT 272* 236*  ALKPHOS 125 109  BILITOT 0.3 1.8*  PROT 6.0* 7.6  ALBUMIN 3.0* 3.9   Recent Labs  Lab 11/18/21 0245  LIPASE 23   No results for input(s): AMMONIA in the last 168 hours. Coagulation profile No results for input(s): INR, PROTIME in the last 168 hours. COVID-19 Labs  No results for input(s): DDIMER, FERRITIN, LDH, CRP in the last 72 hours.  Lab Results  Component Value Date   SARSCOV2NAA NEGATIVE 09/30/2021   SARSCOV2NAA NEGATIVE 08/12/2021   SARSCOV2NAA NEGATIVE 08/05/2021   SARSCOV2NAA NEGATIVE 11/10/2020    CBC: Recent Labs  Lab 11/17/21 2129  WBC 8.8  NEUTROABS 6.4  HGB 15.4  HCT 44.7  MCV 83.2  PLT 366   Cardiac Enzymes: No results for input(s): CKTOTAL, CKMB, CKMBINDEX, TROPONINI in the last 168 hours. BNP (last 3 results) No results for input(s): PROBNP in the last 8760 hours. CBG: Recent Labs  Lab  11/18/21 0320 11/18/21 0414 11/18/21 0513 11/18/21  1324 11/18/21 0720  GLUCAP 202* 199* 188* 186* 207*   D-Dimer: No results for input(s): DDIMER in the last 72 hours. Hgb A1c: No results for input(s): HGBA1C in the last 72 hours. Lipid Profile: No results for input(s): CHOL, HDL, LDLCALC, TRIG, CHOLHDL, LDLDIRECT in the last 72 hours. Thyroid function studies: No results for input(s): TSH, T4TOTAL, T3FREE, THYROIDAB in the last 72 hours.  Invalid input(s): FREET3 Anemia work up: No results for input(s): VITAMINB12, FOLATE, FERRITIN, TIBC, IRON, RETICCTPCT in the last 72 hours. Sepsis Labs: Recent Labs  Lab 11/17/21 2129  WBC 8.8   Microbiology No results found for this or any previous visit (from the past 240 hour(s)).   Medications:    Chlorhexidine Gluconate Cloth  6 each Topical Daily   enoxaparin (LOVENOX) injection  40 mg Subcutaneous Q24H   mouth rinse  15 mL Mouth Rinse BID   Continuous Infusions:  dextrose 5% lactated ringers 125 mL/hr at 11/18/21 0650   insulin 3 Units/hr (11/18/21 0650)   lactated ringers Stopped (11/18/21 0328)      LOS: 1 day   Marinda Elk  [facility]  11/18/2021, 7:30 AM

## 2021-11-18 NOTE — Progress Notes (Incomplete Revision)
Inpatient Diabetes Program Recommendations  AACE/ADA: New Consensus Statement on Inpatient Glycemic Control (2015)  Target Ranges:  Prepandial:   less than 140 mg/dL      Peak postprandial:   less than 180 mg/dL (1-2 hours)      Critically ill patients:  140 - 180 mg/dL   Lab Results  Component Value Date   GLUCAP 142 (H) 11/18/2021   HGBA1C 8.8 (H) 08/13/2021    Review of Glycemic Control  Diabetes history: DM1 (does not make insulin. Needs correction basal and meal coverage)  Outpatient Diabetes medications: Lantus 40 BID, Novolog 8 units TID  Current orders for Inpatient glycemic control: Transitioning to Semglee 14 units BID, Novolog 0-15 TID with meals and 0-5 HS + 4 units TID  HgbA1C - 8.8%  Inpatient Diabetes Program Recommendations:    Increase Semglee to 30 units BID  Decrease Novolog to 0-9 units TID with meals and 0-5 HS  Increase Novolog meal coverage to 8 units TID with meals if eating at least 50%.  Pt appears to be very resistant to insulin. Has required more basal and meal coverage in past hospitalizations.   Continue to follow glucose trends.  Thank you. Ailene Ards, RD, LDN, CDE Inpatient Diabetes Coordinator [phone]   Addendum: Spoke with pt at bedside regarding his DKA, frequent admissions and psych needs. Pt states he went to [facility] for S/I and treatment for meth addiction. Was sent to [facility] for early DKA and uncontrolled DM1. Pt does not understand why he can't get mental health assistance. Would like to return to [facility] [place] because they were the only ones that really helped him.  Asked to have diet liberalized. Will ask for larger portions of CHO mod diet. States when using meth, he does not take his insulin. No issues in obtaining insulin. Pt also states he would like to be referred to Endocrinologist.  Will Secure text MD regarding larger portions on CHO mod diet. Agree with need for inpatient psych.  Thank  you. Ailene Ards, RD, LDN, CDE Inpatient Diabetes Coordinator [phone]

## 2021-11-19 DIAGNOSIS — F1510 Other stimulant abuse, uncomplicated: Secondary | ICD-10-CM | POA: Diagnosis not present

## 2021-11-19 DIAGNOSIS — E875 Hyperkalemia: Secondary | ICD-10-CM | POA: Diagnosis not present

## 2021-11-19 DIAGNOSIS — F329 Major depressive disorder, single episode, unspecified: Secondary | ICD-10-CM

## 2021-11-19 DIAGNOSIS — E1010 Type 1 diabetes mellitus with ketoacidosis without coma: Secondary | ICD-10-CM | POA: Diagnosis not present

## 2021-11-19 DIAGNOSIS — N179 Acute kidney failure, unspecified: Secondary | ICD-10-CM | POA: Diagnosis not present

## 2021-11-19 DIAGNOSIS — F913 Oppositional defiant disorder: Secondary | ICD-10-CM

## 2021-11-19 DIAGNOSIS — R45851 Suicidal ideations: Secondary | ICD-10-CM

## 2021-11-19 LAB — GLUCOSE, CAPILLARY
Glucose-Capillary: 208 mg/dL — ABNORMAL HIGH (ref 70–99)
Glucose-Capillary: 283 mg/dL — ABNORMAL HIGH (ref 70–99)
Glucose-Capillary: 205 mg/dL — ABNORMAL HIGH (ref 70–99)
Glucose-Capillary: 189 mg/dL — ABNORMAL HIGH (ref 70–99)

## 2021-11-19 MED ORDER — INSULIN ASPART 100 UNIT/ML IJ SOLN
12.0000 [IU] | Freq: Three times a day (TID) | INTRAMUSCULAR | Status: DC
  Administered 2021-11-19 – 2021-11-20 (×2): 12 [IU] via SUBCUTANEOUS

## 2021-11-19 MED ORDER — INSULIN ASPART 100 UNIT/ML IJ SOLN: 0.0000 [IU] | Freq: Every day | INTRAMUSCULAR | Status: DC

## 2021-11-19 MED ORDER — HALOPERIDOL 0.5 MG PO TABS
1.0000 mg | ORAL_TABLET | Freq: Once | ORAL | Status: AC
  Administered 2021-11-19: 1 mg via ORAL
  Filled 2021-11-19: qty 2

## 2021-11-19 MED ORDER — INSULIN GLARGINE-YFGN 100 UNIT/ML ~~LOC~~ SOLN
50.0000 [IU] | Freq: Two times a day (BID) | SUBCUTANEOUS | Status: DC
  Administered 2021-11-19 – 2021-11-20 (×3): 50 [IU] via SUBCUTANEOUS
  Filled 2021-11-19 (×4): qty 0.5

## 2021-11-19 MED ORDER — INSULIN ASPART 100 UNIT/ML IJ SOLN
0.0000 [IU] | Freq: Three times a day (TID) | INTRAMUSCULAR | Status: DC
  Administered 2021-11-19: 11 [IU] via SUBCUTANEOUS
  Administered 2021-11-19 (×2): 7 [IU] via SUBCUTANEOUS
  Administered 2021-11-20: 3 [IU] via SUBCUTANEOUS

## 2021-11-19 NOTE — Progress Notes (Signed)
Inpatient Diabetes Program Recommendations  AACE/ADA: New Consensus Statement on Inpatient Glycemic Control (2015)  Target Ranges:  Prepandial:   less than 140 mg/dL      Peak postprandial:   less than 180 mg/dL (1-2 hours)      Critically ill patients:  140 - 180 mg/dL   Lab Results  Component Value Date   GLUCAP 189 (H) 11/19/2021   HGBA1C 8.8 (H) 08/13/2021    Review of Glycemic Control   Current orders for Inpatient glycemic control: Lantus 50 BID, Novolog 0-20 TID with meals and 0-5 HS + 12 units TID  Inpatient Diabetes Program Recommendations:    Much improved blood sugar control. Liberalized CHO mod diet to include larger portions.  Spoke with pt at bedside on 5/23. Pt very concerned that he will be sent home instead of going to [facility] for inpatient admissions. States he needs help with coming off meth and assistance with depression, suicidal ideation. Pt states he's reaching out for help and is always sent away. Feels he needs inpatient admissions to avoid relapse.  Many complaints about not receiving enough food at meals. Have added large portions to CHO mod diet.   Continue to follow closely.  Thank you. Lorenda Peck, RD, LDN, CDE Inpatient Diabetes Coordinator [phone]

## 2021-11-19 NOTE — Progress Notes (Signed)
Patient's sitter asked writer to stop by patient's room due to patient verbalizing concerning statements. Per the patient's sitter, patient was upset after meeting with "someone from Psych" and stated that he was going to "shoot" himself if he was not going to be discharged to inpatient behavioral health. When writer attempted to discuss this statement, he verbalized, "I don't want to talk about it'. Patient initially refused medication and treatments but after education he complied. Psychiatry NP made aware of patient's statement. Patient remains on 1:1 suicidal precautions.

## 2021-11-19 NOTE — Progress Notes (Signed)
  Transition of Care [facility]) Screening Note   Patient Details  Name: [name] [name]. Date of Birth: 2001/01/03   Transition of Care [facility]) CM/SW Contact:    Lanier Clam, RN Phone Number: 11/19/2021, 10:20 AM    Transition of Care Department [facility]) has reviewed patient and no TOC needs have been identified at this time. We will continue to monitor patient advancement through interdisciplinary progression rounds. If new patient transition needs arise, please place a TOC consult.

## 2021-11-19 NOTE — Progress Notes (Signed)
[facility] PROGRESS NOTE    Progress Note  [name] [name].  [id number] DOB: 2000/12/25 DOA: 11/17/2021 PCP: Merryl Hacker, No     Brief Narrative:   [name] [name]. is an 21 y.o. male past medical history of diabetes mellitus type 1, methamphetamine use, chronic hepatitis C schizoaffective disorder with a recent admission with small bowel obstruction and early DKA with suicidal ideation now presents with abdominal pain nausea vomiting and hyperglycemia found to be in DKA.    Assessment/Plan:   Diabetes mellitus type 1 /  DKA (diabetic ketoacidosis) (HCC) Last A1c of 8.8 in February 2023. Blood glucose of 700 bicarb of 9 and a gap of 27. Transition from IV insulin to long-acting insulin plus sliding scale he is eating double portions, his blood glucose charted up to 500 increase long-acting insulin, change sliding scale to resistant. Discontinue telemetry, transfer to [place].  Acute kidney injury/hyperkalemia: With a baseline creatinine of 0.5 likely prerenal azotemia in the setting of DKA and nausea and vomiting. Resolved with fluid resuscitation..  Abdominal pain/elevated LFTs: Likely due to DKA, LFTs are pending this morning. KUB showed no acute findings.  Suicidal ideation/schizoaffective disorder: Has been thinking about shooting himself. He does not have a gun. Does not want to take the Thorazine anymore. Continue Haldol, psych was consulted recommended inpatient rehab  Amphetamine use: Injects daily expressing intentions to rule out a rehab program.  Chronic hepatitis C: Follow-up with ID as an outpatient   DVT prophylaxis: lovenox Family Communication:none Status is: Inpatient Remains inpatient appropriate because: DKA    Code Status:     Code Status Orders  (From admission, onward)           Start     Ordered   11/17/21 2350  Full code  Continuous        11/17/21 2350           Code Status History     Date Active Date Inactive  Code Status Order ID Comments User Context   11/11/2021 0456 11/13/2021 1752 Full Code [id number]  Etta Quill, DO ED   11/04/2021 1950 11/08/2021 1902 Full Code [id number]  Elmarie Shiley, MD Inpatient   09/30/2021 0337 10/01/2021 2135 Full Code [id number]  Laurin Coder, MD ED   08/12/2021 1530 08/15/2021 1729 Full Code [id number]  Suella Broad, [place] Inpatient   12/10/2020 2353 12/11/2020 0519 Full Code [id number]  Howerter, Ethelda Chick, DO ED   11/10/2020 1542 11/12/2020 1814 Full Code [id number]  Candee Furbish, MD ED   11/10/2020 1531 11/10/2020 1542 Full Code [id number]  Johnsie Cancel, NP ED   11/09/2020 0552 11/10/2020 0021 Full Code [id number]  Etta Quill, DO ED   09/14/2020 1302 09/14/2020 1859 Full Code [id number]  Vashti Hey, MD ED   05/04/2020 0212 05/04/2020 0939 Full Code [id number]  Rozetta Nunnery, NP ED   02/18/2020 0529 02/19/2020 0510 Full Code [id number]  Etta Quill, DO ED   01/15/2020 2130 01/16/2020 1240 Full Code [id number]  Germain Osgood, PA-C ED   10/05/2019 2119 10/06/2019 2041 Full Code [id number]  Deloria Lair, NP Inpatient   09/28/2019 1601 09/28/2019 1658 Full Code [id number]  Derrill Center, NP HOV   07/10/2019 0549 07/10/2019 2041 Full Code [id number]  Lucky Cowboy, MD ED   10/26/2018 1514 10/26/2018 2138 Full Code [id number]  Kathi Ludwig, MD ED   10/06/2018 1813 10/06/2018 2206 Partial Code [id number]  Jean Rosenthal, MD ED  09/25/2018 2030 09/26/2018 0501 Full Code [id number]  Jean Rosenthal, MD ED   09/24/2018 0215 09/24/2018 0556 Full Code [id number]  Rise Patience, MD ED   09/18/2018 0044 09/19/2018 1413 Full Code [id number]  Shanon Rosser, MD ED   09/15/2018 1829 09/16/2018 1940 Full Code [id number]  Elgergawy, Silver Huguenin, MD Inpatient   09/02/2018 1408 09/06/2018 1909 Full Code [id number]  Patrecia Pour, MD Inpatient   08/31/2018 0432 08/31/2018 1837 Full Code [id number]  Ivor Costa, MD ED   08/29/2018 1652 08/30/2018 0854 Full Code [id number]  Lavina Hamman, MD  Inpatient   08/18/2018 0828 08/20/2018 1612 Full Code [id number]  Bonnielee Haff, MD ED   08/09/2018 0323 08/09/2018 2339 Full Code [id number]  Rise Patience, MD ED   07/14/2018 1728 07/14/2018 2303 Full Code [id number]  Ward, Ozella Almond, PA-C ED   04/14/2018 2323 04/19/2018 2143 Full Code [id number]  Charlann Lange, PA-C ED   04/04/2018 1049 04/05/2018 2218 Full Code [id number]  Cristal Deer, MD ED   04/02/2018 1311 04/03/2018 0010 Full Code [id number]  Elgergawy, Silver Huguenin, MD Inpatient   03/11/2018 2143 03/12/2018 1450 Full Code [id number]  Dorie Rank, MD ED   02/25/2018 1556 03/09/2018 1839 Full Code [id number]  Rankin, [place] B, NP Inpatient   02/23/2018 2051 02/25/2018 1511 Full Code [id number]  Caccavale, Sophia, PA-C ED   08/14/2017 2058 08/17/2017 2012 Full Code [id number]  Emeline General, PA-C ED   08/11/2017 2031 08/12/2017 1453 Full Code [id number]  Duffy Bruce, MD ED   07/29/2017 1628 08/08/2017 0830 Full Code [id number]  Ethelene Hal, NP Inpatient   07/24/2017 1954 07/26/2017 1041 Full Code [id number]  Patrecia Pour, NP Inpatient   07/24/2017 0716 07/24/2017 1923 Full Code [id number]  Sherwood Gambler, MD ED   07/16/2017 2203 07/18/2017 2151 Full Code [id number]  Rise Patience, MD ED   01/26/2017 1130 01/29/2017 1758 Full Code [id number]  Erlinda Hong, MD ED   12/18/2016 1529 12/21/2016 1625 Full Code [id number]  Maree Krabbe, MD ED   04/17/2016 1707 05/08/2016 1235 Full Code [id number]  Leo Grosser, MD ED   03/10/2015 2117 03/17/2015 0000 Full Code [id number]  Junius Creamer, NP ED   02/19/2015 1545 02/26/2015 1952 Full Code [id number]  Martinique, Katherine Inpatient   02/19/2015 1541 02/19/2015 1545 Full Code [id number]  [place], Katherine Inpatient   08/21/2014 2241 08/26/2014 1507 Full Code [id number]  Archie Patten, MD Inpatient   04/06/2014 1638 04/13/2014 1627 Full Code [id number]  Delight Hoh, MD Inpatient   03/31/2014 1643 04/06/2014 1638 Full Code [id number]  Archie Balboa, MD ED    03/22/2014 2252 03/31/2014 1643 Full Code [id number]  Antonietta Breach, PA-C ED   03/03/2014 0502 03/09/2014 1554 Full Code [id number]  Lincoln Brigham, RN Inpatient   02/16/2014 1652 02/23/2014 1903 Full Code [id number]  Delight Hoh, MD Inpatient   02/14/2014 2105 02/16/2014 1652 Full Code [id number]  Romero Belling, MD Inpatient   02/02/2014 1621 02/06/2014 1901 Full Code [id number]  Herbie Baltimore, MD Inpatient   06/17/2013 2139 06/21/2013 1900 Full Code [id number]  Avie Arenas, MD ED         IV Access:   Peripheral IV   Procedures and diagnostic studies:   DG Abd 1 View  Result Date: 11/18/2021 CLINICAL DATA:  Diabetic ketoacidosis, abdominal pain EXAM: ABDOMEN - 1 VIEW COMPARISON:  None Available. FINDINGS: Nonobstructive bowel gas pattern. There is diffuse mucosal fold thickening within multiple aerated loops of small  bowel within the left upper quadrant suggesting underlying enteritis. Gas is seen within nondilated appendiceal lumen. No free intraperitoneal gas. No organomegaly. No acute bone abnormality. IMPRESSION: Mucosal fold thickening within multiple loops of small bowel within the left upper quadrant in keeping with an underlying enteritis. No obstruction. Electronically Signed   By: Fidela Salisbury M.D.   On: 11/18/2021 00:25     Medical Consultants:   None.   Subjective:    [name] [name]. no complaints  Objective:    Vitals:   11/19/21 0200 11/19/21 0300 11/19/21 0400 11/19/21 0500  BP: 120/60 (!) 106/52 (!) 102/46 111/67  Pulse: 74 76 72 70  Resp: 13 12 12 13   Temp:   97.6 F (36.4 C)   TempSrc:   Oral   SpO2: 99% 96% 97% 99%  Weight:      Height:       SpO2: 99 %   Intake/Output Summary (Last 24 hours) at 11/19/2021 0645 Last data filed at 11/18/2021 2100 Gross per 24 hour  Intake 1297.81 ml  Output 7150 ml  Net -5852.19 ml    Filed Weights   11/17/21 2100 11/18/21 0113  Weight: 68 kg 72.3 kg    Exam: General exam: In no acute  distress. Respiratory system: Good air movement and clear to auscultation. Cardiovascular system: S1 & S2 heard, RRR. No JVD. Gastrointestinal system: Abdomen is nondistended, soft and nontender.  Central nervous system: Alert and oriented. No focal neurological deficits. Extremities: No pedal edema. Skin: No rashes, lesions or ulcers Psychiatry: Judgement and insight appear normal. Mood & affect appropriate.   Data Reviewed:    Labs: Basic Metabolic Panel: Recent Labs  Lab 11/18/21 0245 11/18/21 0515 11/18/21 0856 11/18/21 1255 11/18/21 1532  NA 134* 136 135 135 130*  K 3.8 3.7 3.7 4.1 5.0  CL 103 104 104 103 98  CO2 16* 23 24 23  19*  GLUCOSE 244* 187* 130* 378* 419*  BUN 15 14 11 14 17   CREATININE 0.87 0.61 0.53* 0.66 0.90  CALCIUM 9.2 9.0 8.6* 8.6* 8.3*    GFR Estimated Creatinine Clearance: 131.7 mL/min (by C-G formula based on SCr of 0.9 mg/dL). Liver Function Tests: Recent Labs  Lab 11/18/21 0245 11/18/21 0515  AST 178* 193*  ALT 236* 226*  ALKPHOS 109 102  BILITOT 1.8* 0.8  PROT 7.6 7.2  ALBUMIN 3.9 3.6    Recent Labs  Lab 11/18/21 0245  LIPASE 23    No results for input(s): AMMONIA in the last 168 hours. Coagulation profile No results for input(s): INR, PROTIME in the last 168 hours. COVID-19 Labs  No results for input(s): DDIMER, FERRITIN, LDH, CRP in the last 72 hours.  Lab Results  Component Value Date   SARSCOV2NAA NEGATIVE 09/30/2021   SARSCOV2NAA NEGATIVE 08/12/2021   SARSCOV2NAA NEGATIVE 08/05/2021   [place] NEGATIVE 11/10/2020    CBC: Recent Labs  Lab 11/17/21 2129  WBC 8.8  NEUTROABS 6.4  HGB 15.4  HCT 44.7  MCV 83.2  PLT 366    Cardiac Enzymes: No results for input(s): CKTOTAL, CKMB, CKMBINDEX, TROPONINI in the last 168 hours. BNP (last 3 results) No results for input(s): PROBNP in the last 8760 hours. CBG: Recent Labs  Lab 11/18/21 1044 11/18/21 1126 11/18/21 1638 11/18/21 2154 11/18/21 2324  GLUCAP  280* 276* 533* 237* 252*    D-Dimer: No results for input(s): DDIMER in the last 72 hours. Hgb A1c: No results for input(s): HGBA1C in the last 72 hours. Lipid  Profile: No results for input(s): CHOL, HDL, LDLCALC, TRIG, CHOLHDL, LDLDIRECT in the last 72 hours. Thyroid function studies: No results for input(s): TSH, T4TOTAL, T3FREE, THYROIDAB in the last 72 hours.  Invalid input(s): FREET3 Anemia work up: No results for input(s): VITAMINB12, FOLATE, FERRITIN, TIBC, IRON, RETICCTPCT in the last 72 hours. Sepsis Labs: Recent Labs  Lab 11/17/21 2129  WBC 8.8    Microbiology No results found for this or any previous visit (from the past 240 hour(s)).   Medications:    Chlorhexidine Gluconate Cloth  6 each Topical Daily   chlorproMAZINE  10 mg Oral BID   chlorproMAZINE  25 mg Oral QHS   enoxaparin (LOVENOX) injection  40 mg Subcutaneous Q24H   insulin aspart  0-15 Units Subcutaneous TID WC   insulin aspart  0-5 Units Subcutaneous QHS   insulin aspart  8 Units Subcutaneous TID WC   insulin glargine-yfgn  50 Units Subcutaneous BID   mouth rinse  15 mL Mouth Rinse BID   Continuous Infusions:      LOS: 2 days   Charlynne Cousins  [facility]  11/19/2021, 6:45 AM

## 2021-11-19 NOTE — Consult Note (Cosign Needed Addendum)
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 Notify RN    Comment 2 Document in Chart   Glucose, capillary     Status: Abnormal   Collection Time: 11/18/21 11:24 PM  Result Value Ref Range   Glucose-Capillary 252 (H) 70 - 99 mg/dL    Comment: Glucose reference range applies only to samples taken after fasting for at least 8 hours.   Comment 1 Notify RN    Comment 2 Document in Chart   Glucose, capillary     Status: Abnormal   Collection Time:  11/19/21  7:47 AM  Result Value Ref Range   Glucose-Capillary 208 (H) 70 - 99 mg/dL    Comment: Glucose reference range applies only to samples taken after fasting for at least 8 hours.  Glucose, capillary     Status: Abnormal   Collection Time: 11/19/21 12:59 PM  Result Value Ref Range   Glucose-Capillary 283 (H) 70 - 99 mg/dL    Comment: Glucose reference range applies only to samples taken after fasting for at least 8 hours.    Current Facility-Administered Medications  Medication Dose Route Frequency Provider Last Rate Last Admin   Chlorhexidine Gluconate Cloth 2 % PADS 6 each  6 each Topical Daily Charlynne Cousins, MD   6 each at 11/19/21 0802   chlorproMAZINE (THORAZINE) tablet 10 mg  10 mg Oral BID Charlynne Cousins, MD   10 mg at 11/19/21 [id number]   chlorproMAZINE (THORAZINE) tablet 25 mg  25 mg Oral QHS Charlynne Cousins, MD   25 mg at 11/18/21 2230   dextrose 50 % solution 0-50 mL  0-50 mL Intravenous PRN Charlynne Cousins, MD       enoxaparin (LOVENOX) injection 40 mg  40 mg Subcutaneous Q24H Charlynne Cousins, MD       insulin aspart (novoLOG) injection 0-20 Units  0-20 Units Subcutaneous TID WC Charlynne Cousins, MD   7 Units at 11/19/21 0800   insulin aspart (novoLOG) injection 0-5 Units  0-5 Units Subcutaneous QHS Charlynne Cousins, MD       insulin aspart (novoLOG) injection 12 Units  12 Units Subcutaneous TID WC Charlynne Cousins, MD       insulin glargine-yfgn [facility]) injection 50 Units  50 Units Subcutaneous BID Charlynne Cousins, MD   50 Units at 11/19/21 [id number]   MEDLINE mouth rinse  15 mL Mouth Rinse BID Charlynne Cousins, MD       ondansetron [facility]) injection 4 mg  4 mg Intravenous Q6H PRN Charlynne Cousins, MD   4 mg at 11/18/21 0124   oxyCODONE (Oxy IR/ROXICODONE) immediate release tablet 5-10 mg  5-10 mg Oral Q6H PRN Kathryne Eriksson, NP   10 mg at 11/19/21 [id number]    Musculoskeletal: Strength & Muscle Tone: within normal limits Gait  & Station: normal Patient leans: N/A    Psychiatric Specialty Exam:  Presentation  General Appearance: Appropriate for Environment; Casual  Eye Contact:Minimal  Speech:Clear and Coherent; Normal Rate  Speech Volume:Normal  Handedness:Right   Mood and Affect  Mood:-- (i dont know)  Affect:Inappropriate; Labile   Thought Process  Thought Processes:Coherent; Linear  Descriptions of Associations:Intact  Orientation:Full (Time, Place and Person)  Thought Content:Logical  History of Schizophrenia/Schizoaffective disorder:Yes  Duration of Psychotic Symptoms:Greater than six months  Hallucinations:Hallucinations: None  Ideas of Reference:None  Suicidal Thoughts:Suicidal Thoughts: Yes, Active SI Active Intent and/or Plan: With Intent; With Plan; With Means to Carry Out; With Access  to Means  Homicidal Thoughts:Homicidal Thoughts: No   Sensorium  Memory:Immediate Good; Recent Good; Remote Good  Judgment:Good  Insight:Good   Executive Functions  Concentration:Good  Attention Span:Good  [place] of Knowledge:Good  Language:Good   Psychomotor Activity  Psychomotor Activity:Psychomotor Activity: Normal   Assets  Assets:Communication Skills; Desire for Improvement; Physical Health; Leisure Time   Sleep  Sleep:Sleep: Fair   Physical Exam: Physical Exam Vitals and nursing note reviewed.  Constitutional:      Appearance: He is well-developed and normal weight.  Neurological:     General: No focal deficit present.     Mental Status: He is alert and oriented to person, place, and time.  Psychiatric:        Attention and Perception: Attention and perception normal.        Mood and Affect: Mood normal.        Speech: Speech normal.        Behavior: Behavior normal. Behavior is cooperative.        Thought Content: Thought content normal.        Cognition and Memory: Cognition normal.        Judgment: Judgment normal.   Review of  Systems  Psychiatric/Behavioral:  Positive for depression and substance abuse. The patient is not nervous/anxious.   All other systems reviewed and are negative. Blood pressure 117/74, pulse 89, temperature 97.9 F (36.6 C), temperature source Oral, resp. rate 16, height 5\' 11"  (1.803 m), weight 72.3 kg, SpO2 99 %. Body mass index is 22.22 kg/m.  Unlikely patient will require inpatient psychiatric hospitalizations as he presents very superficial, gamey and manipulative. Patient has fabricated a story about his mother and brother completing suicide last month.  Patient has also fabricated stories about shooting himself in the head and gum being wrestled away on 4 of his last admissions.  History of borderline personality disorder and current, that is better suited in an outpatient psychiatry hospitalization.  Patient has also refused medications to include insulin, he is aware discharge is unlikely if blood sugars remain elevated. Patient continues to endorse suicidal ideations with intent.   Treatment Plan Summary: Plan   -Continue current CBG recommendations per diabetes coordinator, to include insulin administration as ordered.  -Continue current medications.  -Continue with suicide sitter at this time.  Psychiatry will continue to follow, until patient achieves optimal stabilization.   Disposition: Patient does not meet criteria for psychiatric inpatient admission. Supportive therapy provided about ongoing stressors. Refer to IOP. Discussed crisis plan, support from social network, calling 911, coming to the Emergency Department, and calling Suicide Hotline. Final disposition pending  Suella Broad, FNP 11/19/2021 1:52 PM

## 2021-11-20 DIAGNOSIS — E109 Type 1 diabetes mellitus without complications: Secondary | ICD-10-CM | POA: Diagnosis not present

## 2021-11-20 LAB — COMPREHENSIVE METABOLIC PANEL
ALT: 348 U/L — ABNORMAL HIGH (ref 0–44)
AST: 513 U/L — ABNORMAL HIGH (ref 15–41)
Albumin: 3 g/dL — ABNORMAL LOW (ref 3.5–5.0)
Alkaline Phosphatase: 93 U/L (ref 38–126)
Anion gap: 4 — ABNORMAL LOW (ref 5–15)
BUN: 12 mg/dL (ref 6–20)
CO2: 27 mmol/L (ref 22–32)
Calcium: 8.3 mg/dL — ABNORMAL LOW (ref 8.9–10.3)
Chloride: 106 mmol/L (ref 98–111)
Creatinine, Ser: 0.57 mg/dL — ABNORMAL LOW (ref 0.61–1.24)
GFR, Estimated: >60 mL/min (ref >60)
Glucose, Bld: 128 mg/dL — ABNORMAL HIGH (ref 70–99)
Potassium: 4 mmol/L (ref 3.5–5.1)
Sodium: 137 mmol/L (ref 135–145)
Total Bilirubin: 0.7 mg/dL (ref 0.3–1.2)
Total Protein: 6.2 g/dL — ABNORMAL LOW (ref 6.5–8.1)

## 2021-11-20 LAB — GLUCOSE, CAPILLARY
Glucose-Capillary: 139 mg/dL — ABNORMAL HIGH (ref 70–99)
Glucose-Capillary: 70 mg/dL (ref 70–99)

## 2021-11-20 MED ORDER — INSULIN GLARGINE 100 UNIT/ML SOLOSTAR PEN
40.0000 [IU] | PEN_INJECTOR | Freq: Two times a day (BID) | SUBCUTANEOUS | 3 refills | Status: DC
Start: 2021-11-20 — End: 2021-12-04

## 2021-11-20 MED ORDER — NOVOLOG FLEXPEN 100 UNIT/ML ~~LOC~~ SOPN: 8.0000 [IU] | PEN_INJECTOR | Freq: Three times a day (TID) | SUBCUTANEOUS | 3 refills | Status: DC

## 2021-11-20 NOTE — Consult Note (Signed)
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 companionship in the emergency room and hospital, in an attempt to gain admission to behavioral health hospital for housing.  Patient is stable and psychiatrically clear for discharge.  Treatment Plan Summary: Plan   -Continue current CBG recommendations per diabetes coordinator, to include insulin administration as ordered.  -Continue current medications.  -Patient is requesting documentation for tic disorder and seizures.  Will not start new medication at this time, patient can follow up with neurology for appropriate management of an diagnosis of tic disorder.   -TOC consult to assist with shelters, housing resources, and long-term placement.  [facility] a good [facility] and or [facility] [place] (both  as he is able to work and maintain sobriety.)  Patient is psychiatrically stable to discharge.  Patient has been provided appropriate resources and contact information, to follow-up with outpatient providers to include ACT team, [facility] care coordinator, and residential treatment.   Disposition: Patient does not meet criteria for psychiatric inpatient admission. Supportive therapy provided about ongoing stressors. Refer to IOP. Discussed crisis plan, support from social network, calling 911, coming to the Emergency Department, and calling Suicide Hotline.  Suella Broad, FNP 11/20/2021 1:39 PM

## 2021-11-20 NOTE — Progress Notes (Signed)
Approached patient in regards to transportation for his pending discharge. According to patient he was told he would receive information for inpatient and outpatient behavioral health treatment facilities. Patient verbalized he would shoot himself in the face if he is discharged to the streets. Notified both providers with psychiatry and attending of patient statement. I reached out to case management to include additional outpatient resources, shelters, and bus pass to be added to his AVS. Per psychiatry ok to proceed with discharge.

## 2021-11-20 NOTE — TOC Transition Note (Signed)
Transition of Care [facility]) - CM/SW Discharge Note   Patient Details  Name: [name] [name]. MRN: [id number] Date of Birth: [birth year]  Transition of [facility] CM/SW Contact:  [place]aca Wadas, Meriam Sprague, RN Phone Number: 11/20/2021, 2:24 PM   Clinical Narrative:    Pt to dc today. Psych has cleared him, and he is no longer recommended for inpt psych. Homeless resources and Outpatient mental health resources placed on AVS. RN aware.     Readmission Risk Interventions    11/07/2021    3:11 PM  Readmission Risk Prevention Plan  Medication Review (RN Care Manager) Complete  HRI or Home Care Consult Complete  SW Recovery Care/Counseling Consult Complete  Palliative Care Screening Not Applicable  Skilled Nursing Facility Not Applicable

## 2021-11-20 NOTE — Discharge Summary (Signed)
[facility]  Physician Discharge Summary   Patient ID: [name] [name]. MRN: [id number] DOB/AGE: 23/14/2002 23 y.o.  Admit date: 11/17/2021 Discharge date:   11/20/2021   PCP: Pcp, No  DISCHARGE DIAGNOSES:    Schizoaffective disorder (HCC)   Methamphetamine abuse (HCC)   Abdominal pain   Polysubstance abuse (HCC)     Type I diabetes mellitus ([place])   AKI (acute kidney injury) ([place])   RECOMMENDATIONS FOR OUTPATIENT FOLLOW UP: Patient given resources by Education officer, museum and psychiatry team for outpatient psychiatric care. Patient instructed to follow-up with his provider at [facility] for further management of diabetes Referral has been sent to infectious disease for hepatitis C management LFTs to be monitored closely in the outpatient setting.    Home Health: None Equipment/Devices: None  CODE STATUS: Full code  DISCHARGE CONDITION: fair  Diet recommendation: Modified carbohydrate  INITIAL HISTORY: [name] [name]. is an 21 y.o. male past medical history of diabetes mellitus type 1, methamphetamine use, chronic hepatitis C schizoaffective disorder with a recent admission with small bowel obstruction and early DKA with suicidal ideation now presents with abdominal pain nausea vomiting and hyperglycemia found to be in DKA.   Consultations: Psychiatry  Procedures: None    HOSPITAL COURSE:   Diabetes mellitus type 1 /  DKA (diabetic ketoacidosis) ([place]) Last A1c of 8.8 in February 2023. Blood glucose of 700 bicarb of 9 and a gap of 27. Initially treated with IV insulin.  Transitioned to long-acting insulin.  He denies running out of his medications at home.  Mentions that he has insulin at home.  Giving contradictory information to different care providers. CBGs are reasonably well controlled.  Electrolytes are stable this morning.   Suicidal ideation/schizoaffective disorder: Has been thinking about shooting himself. He does not have a gun. Patient seen by  psychiatry.  Continues to have suicidal ideation.   Reevaluated by psychiatry.  Apparently well-known to their service line due to frequent hospitalization for similar issues.  Apparently gives conflicting information to different providers.  According to psychiatry he also fabricates information.  They do not feel that the patient needs inpatient psychiatric care at this time.  Cleared by them for discharge.    Acute kidney injury/hyperkalemia: Renal function back to baseline.  Potassium is normal.  Transaminitis AST ALT noted to be elevated.  The bilirubin is normal.  Alkaline phosphatase is normal.  Recently done CT of the abdomen pelvis and abdominal ultrasound did not show any acute findings in the hepatobiliary system.  Previous labs reviewed.  He has had elevation in his LFTs on and off for the past year or so.  He does have hepatitis C which could be the reason.  He is nontender.  No need for any further work-up here in the hospital.  He will need to follow-up with infectious disease in the outpatient setting and will need to have his LFTs monitored closely in the outpatient setting.    Amphetamine use: Counseled.  Seen by psychiatry  Chronic hepatitis C: Does not appear that he has seen infectious disease recently.  He will benefit from being seen by them in the outpatient setting.  We will send a referral..     Patient is stable.  Seen by psychiatry.  Evaluated by transition of care for any needs after discharge.  Okay for discharge.   PERTINENT LABS:  The results of significant diagnostics from this hospitalization (including imaging, microbiology, ancillary and laboratory) are listed below for reference.  Labs:     Basic Metabolic Panel: Recent Labs  Lab 11/18/21 0515 11/18/21 0856 11/18/21 1255 11/18/21 1532 11/20/21 0803  NA 136 135 135 130* 137  K 3.7 3.7 4.1 5.0 4.0  CL 104 104 103 98 106  CO2 '23 24 23 ' 19* 27  GLUCOSE 187* 130* 378* 419* 128*  BUN '14 11  14 17 12  ' CREATININE 0.61 0.53* 0.66 0.90 0.57*  CALCIUM 9.0 8.6* 8.6* 8.3* 8.3*   Liver Function Tests: Recent Labs  Lab 11/18/21 0245 11/18/21 0515 11/20/21 0803  AST 178* 193* 513*  ALT 236* 226* 348*  ALKPHOS 109 102 93  BILITOT 1.8* 0.8 0.7  PROT 7.6 7.2 6.2*  ALBUMIN 3.9 3.6 3.0*   Recent Labs  Lab 11/18/21 0245  LIPASE 23   CBC: Recent Labs  Lab 11/17/21 2129  WBC 8.8  NEUTROABS 6.4  HGB 15.4  HCT 44.7  MCV 83.2  PLT 366    CBG: Recent Labs  Lab 11/19/21 1259 11/19/21 1645 11/19/21 2057 11/20/21 0803 11/20/21 1140  GLUCAP 283* 205* 189* 139* 70     IMAGING STUDIES  DG Abd 1 View  Result Date: 11/18/2021 CLINICAL DATA:  Diabetic ketoacidosis, abdominal pain EXAM: ABDOMEN - 1 VIEW COMPARISON:  None Available. FINDINGS: Nonobstructive bowel gas pattern. There is diffuse mucosal fold thickening within multiple aerated loops of small bowel within the left upper quadrant suggesting underlying enteritis. Gas is seen within nondilated appendiceal lumen. No free intraperitoneal gas. No organomegaly. No acute bone abnormality. IMPRESSION: Mucosal fold thickening within multiple loops of small bowel within the left upper quadrant in keeping with an underlying enteritis. No obstruction. Electronically Signed   By: Fidela Salisbury M.D.   On: 11/18/2021 00:25    DISCHARGE EXAMINATION: Vitals:   11/19/21 2054 11/19/21 2359 11/20/21 0541 11/20/21 1353  BP: 130/78 123/80 102/65 132/85  Pulse: (!) 101 85 73 92  Resp: '18 18 16 16  ' Temp: 98.3 F (36.8 C) 97.8 F (36.6 C) 97.6 F (36.4 C) (!) 97.5 F (36.4 C)  TempSrc: Oral Oral Oral Oral  SpO2: 96% 100% 97% 96%  Weight:      Height:       See progress note from earlier today   DISPOSITION: Self-care  Discharge Instructions     Ambulatory referral to Infectious Disease   Complete by: As directed    Hep C   Call MD for:  difficulty breathing, headache or visual disturbances   Complete by: As directed     Call MD for:  extreme fatigue   Complete by: As directed    Call MD for:  persistant dizziness or light-headedness   Complete by: As directed    Call MD for:  persistant nausea and vomiting   Complete by: As directed    Call MD for:  severe uncontrolled pain   Complete by: As directed    Call MD for:  temperature >100.4   Complete by: As directed    Diet Carb Modified   Complete by: As directed    Discharge instructions   Complete by: As directed    Please be sure to follow-up with your primary care provider within 1 week.  Check your glucose levels at home and take insulin as prescribed.  Seek attention if your levels stay consistently greater than 200.  You were cared for by a hospitalist during your hospital stay. If you have any questions about your discharge medications or the care you received while you  were in the hospital after you are discharged, you can call the unit and asked to speak with the hospitalist on call if the hospitalist that took care of you is not available. Once you are discharged, your primary care physician will handle any further medical issues. Please note that NO REFILLS for any discharge medications will be authorized once you are discharged, as it is imperative that you return to your primary care physician (or establish a relationship with a primary care physician if you do not have one) for your aftercare needs so that they can reassess your need for medications and monitor your lab values. If you do not have a primary care physician, you can call [phone] for a physician referral.   Increase activity slowly   Complete by: As directed          Allergies as of 11/20/2021       Reactions   Bee Venom Anaphylaxis   Fish Allergy Anaphylaxis   Shellfish Allergy Anaphylaxis   Tea Anaphylaxis        Medication List     TAKE these medications    Accu-Chek Guide test strip Generic drug: glucose blood Use to test blood sugars in the morning and bed  time.   Accu-Chek Guide w/Device Kit Use as directed   Accu-Chek Softclix Lancets lancets Use to test blood sugars in the morning and at bed time.   Advocate Insulin Pen Needles 29G X 12.7MM Misc Generic drug: Insulin Pen Needle For type I diabetes   BD Pen Needle Nano U/F 32G X 4 MM Misc Generic drug: Insulin Pen Needle Use to inject insulin 3 times daily as directed.   blood glucose meter kit and supplies Kit Dispense based on patient and insurance preference. Use up to four times daily as directed. (FOR ICD-9 250.00, 250.01).   carbamazepine 200 MG tablet Commonly known as: TEGRETOL Take 1 tablet (200 mg total) by mouth every 12 (twelve) hours.   chlorproMAZINE 10 MG tablet Commonly known as: THORAZINE Take 1 tablet (10 mg total) by mouth 2 (two) times daily.   chlorproMAZINE 25 MG tablet Commonly known as: THORAZINE Take 1 tablet (25 mg total) by mouth at bedtime.   EPINEPHrine 0.3 mg/0.3 mL Soaj injection Commonly known as: EPI-PEN Inject 0.3 mg into the muscle once as needed for anaphylaxis (severe allergic reaction).   insulin glargine 100 UNIT/ML Solostar Pen Commonly known as: LANTUS Inject 40 Units into the skin in the morning and at bedtime.   lisinopril 5 MG tablet Commonly known as: ZESTRIL Take 5 mg by mouth daily.   NovoLOG FlexPen 100 UNIT/ML FlexPen Generic drug: insulin aspart Inject 8 Units into the skin 3 (three) times daily with meals.   oxyCODONE 5 MG immediate release tablet Commonly known as: Oxy IR/ROXICODONE Take 1 tablet (5 mg total) by mouth every 6 (six) hours as needed for severe pain.   tamsulosin 0.4 MG Caps capsule Commonly known as: FLOMAX Take 0.4 mg by mouth daily.          Follow-up Information     [facility] Follow up.   Specialty: Behavioral Health Why: Call to inquire about Intensive outpatient program Contact information: [place]: 35 minutes  [facility] Pager on [email or web address]  11/20/2021, 5:17 PM

## 2021-11-20 NOTE — Progress Notes (Signed)
Inpatient Diabetes Program Recommendations  AACE/ADA: New Consensus Statement on Inpatient Glycemic Control (2015)  Target Ranges:  Prepandial:   less than 140 mg/dL      Peak postprandial:   less than 180 mg/dL (1-2 hours)      Critically ill patients:  140 - 180 mg/dL   Lab Results  Component Value Date   GLUCAP 70 11/20/2021   HGBA1C 8.8 (H) 08/13/2021    Review of Glycemic Control  Current orders for Inpatient glycemic control: Lantus 50 BID, Novolog 0-20 TID with meals and 0-5 HS + 12 units TID    Inpatient Diabetes Program Recommendations:    Spoke with pt at bedside this morning. Blood sugars look good. Pt has no money to get insulin or meds at discharge. States he is homeless, has no place to store his insulin. He is requesting admission to inpatient psych for substance abuse and depression with suicidal ideation. Pt states he is hopeless and helpless.   Concern for pt being homeless and having Type 1 DM, with no refrigeration for his insulin. Pt does not seem to be capable of managing his diabetes with his current state of mind. High probability of readmission with Type 1 DM and mental health issues.   Thank you. Ailene Ards, RD, LDN, CDE Inpatient Diabetes Coordinator [phone]

## 2021-11-20 NOTE — Progress Notes (Signed)
[facility] PROGRESS NOTE    Progress Note  [name] [name].  FAO:[id number] DOB: 2001/02/14 DOA: 11/17/2021 PCP: Oneita Hurt, No     Brief Narrative:   [name] [name]. is an 22 y.o. male past medical history of diabetes mellitus type 1, methamphetamine use, chronic hepatitis C schizoaffective disorder with a recent admission with small bowel obstruction and early DKA with suicidal ideation now presents with abdominal pain nausea vomiting and hyperglycemia found to be in DKA.     Assessment/Plan:   Diabetes mellitus type 1 /  DKA (diabetic ketoacidosis) (HCC) Last A1c of 8.8 in February 2023. Blood glucose of 700 bicarb of 9 and a gap of 27. Initially treated with IV insulin.  Transitioned to long-acting insulin.  He denies running out of his medications at home. CBGs are reasonably well controlled.  Electrolytes are stable this morning.  Suicidal ideation/schizoaffective disorder: Has been thinking about shooting himself. He does not have a gun. Patient seen by psychiatry.  Continues to have suicidal ideation.  Psychiatry to reevaluate.  Continue sitter for now.  He wants to go to behavioral health.  No need for IVC at this time.  Acute kidney injury/hyperkalemia: Renal function back to baseline.  Potassium is normal.  Transaminitis AST ALT noted to be elevated.  The bilirubin is normal.  Alkaline phosphatase is normal.  Recently done CT of the abdomen pelvis and abdominal ultrasound did not show any acute findings in the hepatobiliary system.  Previous labs reviewed.  He has had elevation in his LFTs on and off for the past year or so.  He does have hepatitis C which could be the reason.  He is nontender.  No need for any further work-up here in the hospital.  He will need to follow-up with infectious disease in the outpatient setting and will need to have his LFTs monitored closely in the outpatient setting.    Amphetamine use: Counseled.  Seen by psychiatry  Chronic  hepatitis C: Does not appear that he has seen infectious disease recently.  He will benefit from being seen by them in the outpatient setting.  We will send a referral..  Patient at this time is medically stable for discharge.  Will wait on psychiatry team to see if he needs to go to behavioral health due to his persistent suicidal ideation.   DVT prophylaxis: lovenox CODE STATUS: Full code Family Communication:none Disposition: To be determined  Status is: Inpatient Remains inpatient appropriate because: DKA    Procedures and diagnostic studies:    Medical Consultants:   None.   Subjective:   Denies any nausea vomiting.  No abdominal pain.  Objective:    Vitals:   11/19/21 1654 11/19/21 2054 11/19/21 2359 11/20/21 0541  BP: 127/85 130/78 123/80 102/65  Pulse: 99 (!) 101 85 73  Resp: 16 18 18 16   Temp: 98.2 F (36.8 C) 98.3 F (36.8 C) 97.8 F (36.6 C) 97.6 F (36.4 C)  TempSrc: Oral Oral Oral Oral  SpO2: 99% 96% 100% 97%  Weight:      Height:       SpO2: 97 %   Intake/Output Summary (Last 24 hours) at 11/20/2021 1136 Last data filed at 11/20/2021 0531 Gross per 24 hour  Intake 2180 ml  Output --  Net 2180 ml    Filed Weights   11/17/21 2100 11/18/21 0113  Weight: 68 kg 72.3 kg    Exam:  General appearance: Awake alert.  In no distress Resp: Clear to  auscultation bilaterally.  Normal effort Cardio: S1-S2 is normal regular.  No S3-S4.  No rubs murmurs or bruit GI: Abdomen is soft.  Nontender nondistended.  Bowel sounds are present normal.  No masses organomegaly Extremities: No edema.  Full range of motion of lower extremities. Neurologic: Alert and oriented x3.  No focal neurological deficits.     Data Reviewed:    Labs: Basic Metabolic Panel: Recent Labs  Lab 11/18/21 0515 11/18/21 0856 11/18/21 1255 11/18/21 1532 11/20/21 0803  NA 136 135 135 130* 137  K 3.7 3.7 4.1 5.0 4.0  CL 104 104 103 98 106  CO2 23 24 23  19* 27  GLUCOSE  187* 130* 378* 419* 128*  BUN 14 11 14 17 12   CREATININE 0.61 0.53* 0.66 0.90 0.57*  CALCIUM 9.0 8.6* 8.6* 8.3* 8.3*    GFR Estimated Creatinine Clearance: 148.1 mL/min (A) (by C-G formula based on SCr of 0.57 mg/dL (L)). Liver Function Tests: Recent Labs  Lab 11/18/21 0245 11/18/21 0515 11/20/21 0803  AST 178* 193* 513*  ALT 236* 226* 348*  ALKPHOS 109 102 93  BILITOT 1.8* 0.8 0.7  PROT 7.6 7.2 6.2*  ALBUMIN 3.9 3.6 3.0*    Recent Labs  Lab 11/18/21 0245  LIPASE 23     Lab Results  Component Value Date   SARSCOV2NAA NEGATIVE 09/30/2021   SARSCOV2NAA NEGATIVE 08/12/2021   SARSCOV2NAA NEGATIVE 08/05/2021   SARSCOV2NAA NEGATIVE 11/10/2020    CBC: Recent Labs  Lab 11/17/21 2129  WBC 8.8  NEUTROABS 6.4  HGB 15.4  HCT 44.7  MCV 83.2  PLT 366    CBG: Recent Labs  Lab 11/19/21 0747 11/19/21 1259 11/19/21 1645 11/19/21 2057 11/20/21 0803  GLUCAP 208* 283* 205* 189* 139*     Sepsis Labs: Recent Labs  Lab 11/17/21 2129  WBC 8.8     Medications:    Chlorhexidine Gluconate Cloth  6 each Topical Daily   chlorproMAZINE  10 mg Oral BID   chlorproMAZINE  25 mg Oral QHS   enoxaparin (LOVENOX) injection  40 mg Subcutaneous Q24H   insulin aspart  0-20 Units Subcutaneous TID WC   insulin aspart  0-5 Units Subcutaneous QHS   insulin aspart  12 Units Subcutaneous TID WC   insulin glargine-yfgn  50 Units Subcutaneous BID   mouth rinse  15 mL Mouth Rinse BID   Continuous Infusions:      LOS: 3 days   11/19/21  [facility]  11/20/2021, 11:36 AM

## 2021-11-20 NOTE — Discharge Instructions (Addendum)
[facility], multimedia Ext. Comments  [facility] [address]  [place] [phone]  DV Shelter   [facility] [address] [place], [place], [place] [address] [phone]  Men/Woman  [facility] [address]  [place] [phone]  DV Shelter  [facility] [phone] [place] [phone]  Families' w/Children   [facility] [address] [phone]  Men/Women/Families   [facility] [address] [place].  [place] [phone] Men and Women  [facility] [place] [phone]    pregnant/parenting girls and women  [facility]  [place] [phone]  ages [address] Together [place] [phone]  Youth ages 11-17  [facility] [address] [place] [phone]  Men  [facility] [phone]  [place] [phone]  Women  [facility] [address]  [place] [phone]  Single Women and Women w/Children  [facility] [address]  [place] [phone] Men/Women/Families   [facility] [address]   [phone]  DV Shelter  [facility] [address]  [place] [phone] Men and Women  [facility] [address] [place] [phone] Men  [facility] [address]  [place] [phone] Men  [facility] [address]  [place]  [place] [phone]  Single Women and Families  Room at the [facility]. [place] [phone] or  [phone]  Pregnant Women  [facility] [address]  [place]  [phone]  Men/Women and Families

## 2021-11-30 ENCOUNTER — Encounter (HOSPITAL_COMMUNITY): Payer: Self-pay

## 2021-11-30 ENCOUNTER — Emergency Department (HOSPITAL_COMMUNITY): Payer: Medicaid Other

## 2021-11-30 ENCOUNTER — Inpatient Hospital Stay (HOSPITAL_COMMUNITY)
Admission: EM | Admit: 2021-11-30 | Discharge: 2021-12-04 | DRG: 638 | Disposition: A | Discharge status: Home / Self Care | Payer: Medicaid Other | Attending: Internal Medicine | Admitting: Internal Medicine

## 2021-11-30 ENCOUNTER — Other Ambulatory Visit: Payer: Self-pay

## 2021-11-30 DIAGNOSIS — I10 Essential (primary) hypertension: Secondary | ICD-10-CM | POA: Diagnosis present

## 2021-11-30 DIAGNOSIS — F603 Borderline personality disorder: Secondary | ICD-10-CM | POA: Diagnosis present

## 2021-11-30 DIAGNOSIS — R45851 Suicidal ideations: Secondary | ICD-10-CM | POA: Diagnosis present

## 2021-11-30 DIAGNOSIS — F17210 Nicotine dependence, cigarettes, uncomplicated: Secondary | ICD-10-CM | POA: Diagnosis present

## 2021-11-30 DIAGNOSIS — Z720 Tobacco use: Secondary | ICD-10-CM | POA: Diagnosis present

## 2021-11-30 DIAGNOSIS — Z91013 Allergy to seafood: Secondary | ICD-10-CM

## 2021-11-30 DIAGNOSIS — Z8249 Family history of ischemic heart disease and other diseases of the circulatory system: Secondary | ICD-10-CM

## 2021-11-30 DIAGNOSIS — N179 Acute kidney failure, unspecified: Secondary | ICD-10-CM | POA: Diagnosis present

## 2021-11-30 DIAGNOSIS — Z91118 Patient's noncompliance with dietary regimen for other reason: Secondary | ICD-10-CM | POA: Diagnosis not present

## 2021-11-30 DIAGNOSIS — T383X6A Underdosing of insulin and oral hypoglycemic [antidiabetic] drugs, initial encounter: Secondary | ICD-10-CM | POA: Diagnosis present

## 2021-11-30 DIAGNOSIS — Z83438 Family history of other disorder of lipoprotein metabolism and other lipidemia: Secondary | ICD-10-CM

## 2021-11-30 DIAGNOSIS — G40909 Epilepsy, unspecified, not intractable, without status epilepticus: Secondary | ICD-10-CM | POA: Diagnosis present

## 2021-11-30 DIAGNOSIS — Z794 Long term (current) use of insulin: Secondary | ICD-10-CM

## 2021-11-30 DIAGNOSIS — E1010 Type 1 diabetes mellitus with ketoacidosis without coma: Secondary | ICD-10-CM | POA: Diagnosis present

## 2021-11-30 DIAGNOSIS — Z825 Family history of asthma and other chronic lower respiratory diseases: Secondary | ICD-10-CM

## 2021-11-30 DIAGNOSIS — E109 Type 1 diabetes mellitus without complications: Secondary | ICD-10-CM

## 2021-11-30 DIAGNOSIS — B182 Chronic viral hepatitis C: Secondary | ICD-10-CM | POA: Diagnosis present

## 2021-11-30 DIAGNOSIS — E875 Hyperkalemia: Secondary | ICD-10-CM

## 2021-11-30 DIAGNOSIS — Z9151 Personal history of suicidal behavior: Secondary | ICD-10-CM | POA: Diagnosis not present

## 2021-11-30 DIAGNOSIS — Z5900 Homelessness unspecified: Secondary | ICD-10-CM | POA: Diagnosis not present

## 2021-11-30 DIAGNOSIS — F314 Bipolar disorder, current episode depressed, severe, without psychotic features: Secondary | ICD-10-CM | POA: Diagnosis present

## 2021-11-30 DIAGNOSIS — Z79899 Other long term (current) drug therapy: Secondary | ICD-10-CM

## 2021-11-30 DIAGNOSIS — Z833 Family history of diabetes mellitus: Secondary | ICD-10-CM

## 2021-11-30 DIAGNOSIS — R748 Abnormal levels of other serum enzymes: Secondary | ICD-10-CM | POA: Diagnosis present

## 2021-11-30 DIAGNOSIS — F1910 Other psychoactive substance abuse, uncomplicated: Secondary | ICD-10-CM | POA: Diagnosis present

## 2021-11-30 LAB — CBC
HCT: 45 % (ref 39.0–52.0)
Hemoglobin: 14.7 g/dL (ref 13.0–17.0)
MCH: 27.8 pg (ref 26.0–34.0)
MCHC: 32.7 g/dL (ref 30.0–36.0)
MCV: 85.2 fL (ref 80.0–100.0)
Platelets: 337 10*3/uL (ref 150–400)
RBC: 5.28 MIL/uL (ref 4.22–5.81)
RDW: 12.7 % (ref 11.5–15.5)
WBC: 9.1 10*3/uL (ref 4.0–10.5)
nRBC: 0 % (ref 0.0–0.2)

## 2021-11-30 LAB — I-STAT VENOUS BLOOD GAS, ED
Acid-base deficit: 17 mmol/L — ABNORMAL HIGH (ref 0.0–2.0)
Bicarbonate: 8.6 mmol/L — ABNORMAL LOW (ref 20.0–28.0)
Calcium, Ion: 1.07 mmol/L — ABNORMAL LOW (ref 1.15–1.40)
HCT: 49 % (ref 39.0–52.0)
Hemoglobin: 16.7 g/dL (ref 13.0–17.0)
O2 Saturation: 100 %
Potassium: 6.5 mmol/L (ref 3.5–5.1)
Sodium: 120 mmol/L — ABNORMAL LOW (ref 135–145)
TCO2: 9 mmol/L — ABNORMAL LOW (ref 22–32)
pCO2, Ven: 20.8 mmHg — ABNORMAL LOW (ref 44–60)
pH, Ven: 7.222 — ABNORMAL LOW (ref 7.25–7.43)
pO2, Ven: 208 mmHg — ABNORMAL HIGH (ref 32–45)

## 2021-11-30 LAB — GLUCOSE, CAPILLARY
Glucose-Capillary: 163 mg/dL — ABNORMAL HIGH (ref 70–99)
Glucose-Capillary: 154 mg/dL — ABNORMAL HIGH (ref 70–99)
Glucose-Capillary: 169 mg/dL — ABNORMAL HIGH (ref 70–99)
Glucose-Capillary: 208 mg/dL — ABNORMAL HIGH (ref 70–99)
Glucose-Capillary: 390 mg/dL — ABNORMAL HIGH (ref 70–99)
Glucose-Capillary: 503 mg/dL (ref 70–99)
Glucose-Capillary: 309 mg/dL — ABNORMAL HIGH (ref 70–99)

## 2021-11-30 LAB — BASIC METABOLIC PANEL
Anion gap: 28 — ABNORMAL HIGH (ref 5–15)
Anion gap: 29 — ABNORMAL HIGH (ref 5–15)
Anion gap: 18 — ABNORMAL HIGH (ref 5–15)
Anion gap: 10 (ref 5–15)
Anion gap: 9 (ref 5–15)
BUN: 16 mg/dL (ref 6–20)
BUN: 17 mg/dL (ref 6–20)
BUN: 14 mg/dL (ref 6–20)
BUN: 9 mg/dL (ref 6–20)
BUN: 7 mg/dL (ref 6–20)
CO2: 9 mmol/L — ABNORMAL LOW (ref 22–32)
CO2: 7 mmol/L — ABNORMAL LOW (ref 22–32)
CO2: 13 mmol/L — ABNORMAL LOW (ref 22–32)
CO2: 21 mmol/L — ABNORMAL LOW (ref 22–32)
CO2: 21 mmol/L — ABNORMAL LOW (ref 22–32)
Calcium: 9.1 mg/dL (ref 8.9–10.3)
Calcium: 9.3 mg/dL (ref 8.9–10.3)
Calcium: 8.7 mg/dL — ABNORMAL LOW (ref 8.9–10.3)
Calcium: 8.3 mg/dL — ABNORMAL LOW (ref 8.9–10.3)
Calcium: 8.4 mg/dL — ABNORMAL LOW (ref 8.9–10.3)
Chloride: 87 mmol/L — ABNORMAL LOW (ref 98–111)
Chloride: 89 mmol/L — ABNORMAL LOW (ref 98–111)
Chloride: 101 mmol/L (ref 98–111)
Chloride: 103 mmol/L (ref 98–111)
Chloride: 104 mmol/L (ref 98–111)
Creatinine, Ser: 1.76 mg/dL — ABNORMAL HIGH (ref 0.61–1.24)
Creatinine, Ser: 1.71 mg/dL — ABNORMAL HIGH (ref 0.61–1.24)
Creatinine, Ser: 1.31 mg/dL — ABNORMAL HIGH (ref 0.61–1.24)
Creatinine, Ser: 0.8 mg/dL (ref 0.61–1.24)
Creatinine, Ser: 0.76 mg/dL (ref 0.61–1.24)
GFR, Estimated: 55 mL/min — ABNORMAL LOW (ref >60)
GFR, Estimated: 57 mL/min — ABNORMAL LOW (ref >60)
GFR, Estimated: >60 mL/min (ref >60)
GFR, Estimated: >60 mL/min (ref >60)
GFR, Estimated: >60 mL/min (ref >60)
Glucose, Bld: 794 mg/dL (ref 70–99)
Glucose, Bld: 774 mg/dL (ref 70–99)
Glucose, Bld: 334 mg/dL — ABNORMAL HIGH (ref 70–99)
Glucose, Bld: 181 mg/dL — ABNORMAL HIGH (ref 70–99)
Glucose, Bld: 166 mg/dL — ABNORMAL HIGH (ref 70–99)
Potassium: 5.2 mmol/L — ABNORMAL HIGH (ref 3.5–5.1)
Potassium: 6.8 mmol/L (ref 3.5–5.1)
Potassium: 4.1 mmol/L (ref 3.5–5.1)
Potassium: 4 mmol/L (ref 3.5–5.1)
Potassium: 3.7 mmol/L (ref 3.5–5.1)
Sodium: 124 mmol/L — ABNORMAL LOW (ref 135–145)
Sodium: 125 mmol/L — ABNORMAL LOW (ref 135–145)
Sodium: 132 mmol/L — ABNORMAL LOW (ref 135–145)
Sodium: 134 mmol/L — ABNORMAL LOW (ref 135–145)
Sodium: 134 mmol/L — ABNORMAL LOW (ref 135–145)

## 2021-11-30 LAB — CBG MONITORING, ED
Glucose-Capillary: >600 mg/dL (ref 70–99)
Glucose-Capillary: 400 mg/dL — ABNORMAL HIGH (ref 70–99)
Glucose-Capillary: 528 mg/dL (ref 70–99)
Glucose-Capillary: 337 mg/dL — ABNORMAL HIGH (ref 70–99)
Glucose-Capillary: 273 mg/dL — ABNORMAL HIGH (ref 70–99)
Glucose-Capillary: 238 mg/dL — ABNORMAL HIGH (ref 70–99)
Glucose-Capillary: 190 mg/dL — ABNORMAL HIGH (ref 70–99)
Glucose-Capillary: 177 mg/dL — ABNORMAL HIGH (ref 70–99)

## 2021-11-30 LAB — URINALYSIS, ROUTINE W REFLEX MICROSCOPIC
Bacteria, UA: NONE SEEN
Bilirubin Urine: NEGATIVE
Glucose, UA: >=500 mg/dL — AB
Hgb urine dipstick: NEGATIVE
Ketones, ur: 80 mg/dL — AB
Leukocytes,Ua: NEGATIVE
Nitrite: NEGATIVE
Protein, ur: NEGATIVE mg/dL
Specific Gravity, Urine: 1.016 (ref 1.005–1.030)
pH: 5 (ref 5.0–8.0)

## 2021-11-30 LAB — HEPATIC FUNCTION PANEL
ALT: 215 U/L — ABNORMAL HIGH (ref 0–44)
AST: 98 U/L — ABNORMAL HIGH (ref 15–41)
Albumin: 4.7 g/dL (ref 3.5–5.0)
Alkaline Phosphatase: 136 U/L — ABNORMAL HIGH (ref 38–126)
Bilirubin, Direct: 0.4 mg/dL — ABNORMAL HIGH (ref 0.0–0.2)
Indirect Bilirubin: 2.3 mg/dL — ABNORMAL HIGH (ref 0.3–0.9)
Total Bilirubin: 2.7 mg/dL — ABNORMAL HIGH (ref 0.3–1.2)
Total Protein: 8.8 g/dL — ABNORMAL HIGH (ref 6.5–8.1)

## 2021-11-30 LAB — BETA-HYDROXYBUTYRIC ACID
Beta-Hydroxybutyric Acid: >8 mmol/L — ABNORMAL HIGH (ref 0.05–0.27)
Beta-Hydroxybutyric Acid: 1.79 mmol/L — ABNORMAL HIGH (ref 0.05–0.27)
Beta-Hydroxybutyric Acid: 1.01 mmol/L — ABNORMAL HIGH (ref 0.05–0.27)

## 2021-11-30 LAB — TROPONIN I (HIGH SENSITIVITY)
Troponin I (High Sensitivity): 5 ng/L (ref <18)
Troponin I (High Sensitivity): 4 ng/L (ref <18)

## 2021-11-30 LAB — LIPASE, BLOOD: Lipase: 22 U/L (ref 11–51)

## 2021-11-30 MED ORDER — LACTATED RINGERS IV BOLUS
20.0000 mL/kg | Freq: Once | INTRAVENOUS | Status: AC
Start: 2021-11-30 — End: 2021-11-30
  Administered 2021-11-30: 1446 mL via INTRAVENOUS

## 2021-11-30 MED ORDER — HYDRALAZINE HCL 20 MG/ML IJ SOLN
5.0000 mg | INTRAMUSCULAR | Status: DC | PRN
Start: 2021-11-30 — End: 2021-12-04

## 2021-11-30 MED ORDER — DEXTROSE 50 % IV SOLN: 0.0000 mL | INTRAVENOUS | Status: DC | PRN

## 2021-11-30 MED ORDER — CARBAMAZEPINE 200 MG PO TABS
200.0000 mg | ORAL_TABLET | Freq: Two times a day (BID) | ORAL | Status: DC
  Administered 2021-11-30 – 2021-12-04 (×8): 200 mg via ORAL
  Filled 2021-11-30 (×10): qty 1

## 2021-11-30 MED ORDER — FENTANYL CITRATE PF 50 MCG/ML IJ SOSY
50.0000 ug | PREFILLED_SYRINGE | Freq: Once | INTRAMUSCULAR | Status: AC
  Administered 2021-11-30: 50 ug via INTRAVENOUS
  Filled 2021-11-30: qty 1

## 2021-11-30 MED ORDER — HALOPERIDOL 5 MG PO TABS
5.0000 mg | ORAL_TABLET | Freq: Two times a day (BID) | ORAL | Status: DC
  Administered 2021-11-30 – 2021-12-04 (×8): 5 mg via ORAL
  Filled 2021-11-30 (×10): qty 1

## 2021-11-30 MED ORDER — DEXTROSE IN LACTATED RINGERS 5 % IV SOLN: INTRAVENOUS | Status: DC

## 2021-11-30 MED ORDER — LACTATED RINGERS IV SOLN: INTRAVENOUS | Status: DC

## 2021-11-30 MED ORDER — INSULIN ASPART 100 UNIT/ML IJ SOLN
0.0000 [IU] | Freq: Every day | INTRAMUSCULAR | Status: DC
  Administered 2021-11-30: 4 [IU] via SUBCUTANEOUS
  Administered 2021-12-01: 3 [IU] via SUBCUTANEOUS

## 2021-11-30 MED ORDER — CHLORPROMAZINE HCL 10 MG PO TABS
10.0000 mg | ORAL_TABLET | Freq: Two times a day (BID) | ORAL | Status: DC
  Filled 2021-11-30: qty 1

## 2021-11-30 MED ORDER — INSULIN REGULAR(HUMAN) IN NACL 100-0.9 UT/100ML-% IV SOLN
INTRAVENOUS | Status: DC
  Administered 2021-11-30: 8.5 [IU]/h via INTRAVENOUS
  Filled 2021-11-30: qty 100

## 2021-11-30 MED ORDER — ENOXAPARIN SODIUM 40 MG/0.4ML IJ SOSY
40.0000 mg | PREFILLED_SYRINGE | INTRAMUSCULAR | Status: DC
  Administered 2021-11-30 – 2021-12-01 (×2): 40 mg via SUBCUTANEOUS
  Filled 2021-11-30 (×4): qty 0.4

## 2021-11-30 MED ORDER — POTASSIUM CHLORIDE 10 MEQ/100ML IV SOLN
10.0000 meq | INTRAVENOUS | Status: AC
  Administered 2021-11-30: 10 meq via INTRAVENOUS
  Filled 2021-11-30 (×2): qty 100

## 2021-11-30 MED ORDER — FAMOTIDINE IN NACL 20-0.9 MG/50ML-% IV SOLN
20.0000 mg | Freq: Once | INTRAVENOUS | Status: AC
  Administered 2021-11-30: 20 mg via INTRAVENOUS
  Filled 2021-11-30: qty 50

## 2021-11-30 MED ORDER — INSULIN REGULAR(HUMAN) IN NACL 100-0.9 UT/100ML-% IV SOLN
INTRAVENOUS | Status: DC
  Administered 2021-11-30: 3.4 [IU]/h via INTRAVENOUS

## 2021-11-30 MED ORDER — INSULIN ASPART 100 UNIT/ML IJ SOLN
0.0000 [IU] | Freq: Three times a day (TID) | INTRAMUSCULAR | Status: DC
  Administered 2021-11-30: 15 [IU] via SUBCUTANEOUS
  Administered 2021-12-01: 11 [IU] via SUBCUTANEOUS
  Administered 2021-12-01: 8 [IU] via SUBCUTANEOUS
  Administered 2021-12-01: 2 [IU] via SUBCUTANEOUS
  Administered 2021-12-02: 3 [IU] via SUBCUTANEOUS
  Administered 2021-12-02: 8 [IU] via SUBCUTANEOUS
  Administered 2021-12-03: 3 [IU] via SUBCUTANEOUS
  Administered 2021-12-03 – 2021-12-04 (×2): 11 [IU] via SUBCUTANEOUS

## 2021-11-30 MED ORDER — IBUPROFEN 400 MG PO TABS: 200.0000 mg | ORAL_TABLET | ORAL | Status: DC | PRN

## 2021-11-30 MED ORDER — SODIUM CHLORIDE 0.9 % IV SOLN: INTRAVENOUS | Status: DC

## 2021-11-30 MED ORDER — NICOTINE 14 MG/24HR TD PT24
14.0000 mg | MEDICATED_PATCH | Freq: Every day | TRANSDERMAL | Status: DC
  Filled 2021-11-30 (×3): qty 1

## 2021-11-30 MED ORDER — INSULIN GLARGINE-YFGN 100 UNIT/ML ~~LOC~~ SOLN
25.0000 [IU] | SUBCUTANEOUS | Status: DC
Start: 2021-11-30 — End: 2021-12-02
  Administered 2021-11-30 – 2021-12-01 (×2): 25 [IU] via SUBCUTANEOUS
  Filled 2021-11-30 (×3): qty 0.25

## 2021-11-30 MED ORDER — LACTATED RINGERS IV BOLUS
1000.0000 mL | INTRAVENOUS | Status: AC
  Administered 2021-11-30 (×2): 1000 mL via INTRAVENOUS

## 2021-11-30 MED ORDER — OXYCODONE HCL 5 MG PO TABS
5.0000 mg | ORAL_TABLET | ORAL | Status: AC | PRN
  Administered 2021-11-30 – 2021-12-01 (×2): 5 mg via ORAL
  Filled 2021-11-30 (×2): qty 1

## 2021-11-30 MED ORDER — INSULIN ASPART 100 UNIT/ML IJ SOLN
4.0000 [IU] | Freq: Three times a day (TID) | INTRAMUSCULAR | Status: DC
  Administered 2021-11-30 – 2021-12-04 (×9): 4 [IU] via SUBCUTANEOUS

## 2021-11-30 MED ORDER — CHLORPROMAZINE HCL 25 MG PO TABS
25.0000 mg | ORAL_TABLET | Freq: Every day | ORAL | Status: DC
  Filled 2021-11-30: qty 1

## 2021-11-30 MED ORDER — HALOPERIDOL 5 MG PO TABS
5.0000 mg | ORAL_TABLET | Freq: Once | ORAL | Status: AC
  Administered 2021-11-30: 5 mg via ORAL
  Filled 2021-11-30: qty 1

## 2021-11-30 NOTE — ED Triage Notes (Addendum)
Pt BIB EMS from home due to cp. Pt denies any sob,N&V. Per EMS pt reported feel anxious with HR in the 140's. Pt has h/o tourette syndrome

## 2021-11-30 NOTE — Progress Notes (Signed)
Admission assessment complete on client. No complaints of pain have been made. Continuous insulin drip management facilitated this shift. Skin swarm complete. Skin is clean, dry and intact, no lesions found. Dual RN signoff complete. Telemetry monitoring in progress.
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
## 2021-11-30 NOTE — ED Notes (Signed)
Critical value reported:   K 6.8 Glu 774  MD Madilyn Hook notified

## 2021-11-30 NOTE — ED Provider Notes (Signed)
[facility] EMERGENCY DEPARTMENT Provider Note   CSN: [id number] Arrival date & time: 11/30/21  0235     History  Chief Complaint  Patient presents with   Chest Pain    [name] [name]. is a 21 y.o. male.  The history is provided by the patient and medical records.  Chest Pain [name] [name]. is a 21 y.o. male who presents to the Emergency Department complaining of chest pain.  He presents to the ED for evaluation of left sided chest pain that started yesterday.  He has associated tightness and throbbing in the RUQ that started at the same time.  Sxs are constant in nature.   No fever.  No vomiting, has nausea.  No diarrhea.  Has sob.  Has mild cough.  No dysuria.  No leg swelling.   Uses tobacco, occasional alcohol.  No hx/o DVT/PE.  Ran out of insulin - unsure when.     Home Medications Prior to Admission medications   Medication Sig Start Date End Date Taking? Authorizing Provider  Accu-Chek Softclix Lancets lancets Use to test blood sugars in the morning and at bed time. 11/13/21   Shelly Coss, MD  blood glucose meter kit and supplies KIT Dispense based on patient and insurance preference. Use up to four times daily as directed. (FOR ICD-9 250.00, 250.01). 11/09/20   Florencia Reasons, MD  Blood Glucose Monitoring Suppl (ACCU-CHEK GUIDE) w/Device KIT Use as directed 11/13/21   Shelly Coss, MD  carbamazepine (TEGRETOL) 200 MG tablet Take 1 tablet (200 mg total) by mouth every 12 (twelve) hours. Patient not taking: Reported on 11/11/2021 08/15/21 12/07/21  Briant Cedar, MD  chlorproMAZINE (THORAZINE) 10 MG tablet Take 1 tablet (10 mg total) by mouth 2 (two) times daily. 11/13/21 12/13/21  Shelly Coss, MD  chlorproMAZINE (THORAZINE) 25 MG tablet Take 1 tablet (25 mg total) by mouth at bedtime. 11/13/21 12/13/21  Shelly Coss, MD  EPINEPHrine 0.3 mg/0.3 mL IJ SOAJ injection Inject 0.3 mg into the muscle once as needed for anaphylaxis (severe allergic  reaction). 05/02/20   [provider]  glucose blood (ACCU-CHEK GUIDE) test strip Use to test blood sugars in the morning and bed time. 11/13/21   Shelly Coss, MD  insulin aspart (NOVOLOG FLEXPEN) 100 UNIT/ML FlexPen Inject 8 Units into the skin 3 (three) times daily with meals. 11/20/21   Bonnielee Haff, MD  insulin glargine (LANTUS) 100 UNIT/ML Solostar Pen Inject 40 Units into the skin in the morning and at bedtime. 11/20/21   Bonnielee Haff, MD  Insulin Pen Needle (ADVOCATE INSULIN PEN NEEDLES) 29G X 12.7MM MISC For type I diabetes 11/09/20   Florencia Reasons, MD  Insulin Pen Needle 32G X 4 MM MISC Use to inject insulin 3 times daily as directed. 11/13/21   Shelly Coss, MD  lisinopril (ZESTRIL) 5 MG tablet Take 5 mg by mouth daily.    [provider]  oxyCODONE (OXY IR/ROXICODONE) 5 MG immediate release tablet Take 1 tablet (5 mg total) by mouth every 6 (six) hours as needed for severe pain. 11/08/21   Oswald Hillock, MD  tamsulosin (FLOMAX) 0.4 MG CAPS capsule Take 0.4 mg by mouth daily.    [provider]      Allergies    Bee venom, Fish allergy, Shellfish allergy, and Tea    Review of Systems   Review of Systems  Cardiovascular:  Positive for chest pain.  All other systems reviewed and are negative.  Physical  Exam Updated Vital Signs BP (!) 146/77   Pulse (!) 115   Temp 98.1 F (36.7 C) (Oral)   Resp (!) 26   SpO2 100%  Physical Exam Vitals and nursing note reviewed.  Constitutional:      Appearance: He is well-developed.  HENT:     Head: Normocephalic and atraumatic.  Cardiovascular:     Rate and Rhythm: Regular rhythm. Tachycardia present.     Heart sounds: No murmur heard. Pulmonary:     Effort: Pulmonary effort is normal. No respiratory distress.     Breath sounds: Normal breath sounds.  Abdominal:     Palpations: Abdomen is soft.     Tenderness: There is no guarding or rebound.     Comments: Moderate epigastric tenderness  Musculoskeletal:         General: No swelling or tenderness.  Skin:    General: Skin is warm and dry.  Neurological:     Mental Status: He is alert and oriented to person, place, and time.  Psychiatric:     Comments: Anxious appearing.     ED Results / Procedures / Treatments   Labs (all labs ordered are listed, but only abnormal results are displayed) Labs Reviewed  BASIC METABOLIC PANEL - Abnormal; Notable for the following components:      Result Value   Sodium 124 (*)    Potassium 5.2 (*)    Chloride 87 (*)    CO2 9 (*)    Glucose, Bld 794 (*)    Creatinine, Ser 1.76 (*)    GFR, Estimated 55 (*)    Anion gap 28 (*)    All other components within normal limits  HEPATIC FUNCTION PANEL - Abnormal; Notable for the following components:   Total Protein 8.8 (*)    AST 98 (*)    ALT 215 (*)    Alkaline Phosphatase 136 (*)    Total Bilirubin 2.7 (*)    Bilirubin, Direct 0.4 (*)    Indirect Bilirubin 2.3 (*)    All other components within normal limits  BASIC METABOLIC PANEL - Abnormal; Notable for the following components:   Sodium 125 (*)    Potassium 6.8 (*)    Chloride 89 (*)    CO2 7 (*)    Glucose, Bld 774 (*)    Creatinine, Ser 1.71 (*)    GFR, Estimated 57 (*)    Anion gap 29 (*)    All other components within normal limits  BETA-HYDROXYBUTYRIC ACID - Abnormal; Notable for the following components:   Beta-Hydroxybutyric Acid >8.00 (*)    All other components within normal limits  URINALYSIS, ROUTINE W REFLEX MICROSCOPIC - Abnormal; Notable for the following components:   Color, Urine COLORLESS (*)    Glucose, UA >=500 (*)    Ketones, ur 80 (*)    All other components within normal limits  I-STAT VENOUS BLOOD GAS, ED - Abnormal; Notable for the following components:   pH, Ven 7.222 (*)    pCO2, Ven 20.8 (*)    pO2, Ven 208 (*)    Bicarbonate 8.6 (*)    TCO2 9 (*)    Acid-base deficit 17.0 (*)    Sodium 120 (*)    Potassium 6.5 (*)    Calcium, Ion 1.07 (*)    All other  components within normal limits  CBG MONITORING, ED - Abnormal; Notable for the following components:   Glucose-Capillary >600 (*)    All other components within normal limits  CBG  MONITORING, ED - Abnormal; Notable for the following components:   Glucose-Capillary 528 (*)    All other components within normal limits  CBG MONITORING, ED - Abnormal; Notable for the following components:   Glucose-Capillary 400 (*)    All other components within normal limits  CBG MONITORING, ED - Abnormal; Notable for the following components:   Glucose-Capillary 337 (*)    All other components within normal limits  CBC  LIPASE, BLOOD  BETA-HYDROXYBUTYRIC ACID  BETA-HYDROXYBUTYRIC ACID  BASIC METABOLIC PANEL  BASIC METABOLIC PANEL  BASIC METABOLIC PANEL  BASIC METABOLIC PANEL  TROPONIN I (HIGH SENSITIVITY)  TROPONIN I (HIGH SENSITIVITY)    EKG EKG Interpretation  Date/Time:  Saturday November 30 2021 05:22:48 EDT Ventricular Rate:  111 PR Interval:  146 QRS Duration: 81 QT Interval:  328 QTC Calculation: 446 R Axis:   67 Text Interpretation: Sinus tachycardia Borderline T abnormalities, inferior leads Borderline ST elevation, lateral leads Confirmed by Quintella Reichert [phone]) on 11/30/2021 5:29:22 AM  Radiology DG Chest Port 1 View  Result Date: 11/30/2021 CLINICAL DATA:  Chest pain. EXAM: PORTABLE CHEST 1 VIEW COMPARISON:  PA Lat 11/11/2021 FINDINGS: The heart size and mediastinal contours are within normal limits. Both lungs are clear. The visualized skeletal structures are unremarkable. There are multiple overlying monitor wires. IMPRESSION: No active disease.  Stable chest. Electronically Signed   By: Telford Nab M.D.   On: 11/30/2021 04:05    Procedures Procedures   CRITICAL CARE Performed by: Quintella Reichert   Total critical care time: 45 minutes  Critical care time was exclusive of separately billable procedures and treating other patients.  Critical care was necessary to treat  or prevent imminent or life-threatening deterioration.  Critical care was time spent personally by me on the following activities: development of treatment plan with patient and/or surrogate as well as nursing, discussions with consultants, evaluation of patient's response to treatment, examination of patient, obtaining history from patient or surrogate, ordering and performing treatments and interventions, ordering and review of laboratory studies, ordering and review of radiographic studies, pulse oximetry and re-evaluation of patient's condition.  Medications Ordered in ED Medications  insulin regular, human (MYXREDLIN) 100 units/ 100 mL infusion (4.6 Units/hr Intravenous Rate/Dose Change 11/30/21 0625)  dextrose 5 % in lactated ringers infusion ( Intravenous Not Given 11/30/21 0530)  dextrose 50 % solution 0-50 mL (has no administration in time range)  0.9 %  sodium chloride infusion ( Intravenous New Bag/Given 11/30/21 0532)  lactated ringers bolus 1,446 mL (0 mLs Intravenous Stopped 11/30/21 0515)  famotidine (PEPCID) IVPB 20 mg premix (0 mg Intravenous Stopped 11/30/21 0515)  fentaNYL (SUBLIMAZE) injection 50 mcg (50 mcg Intravenous Given 11/30/21 0410)  haloperidol (HALDOL) tablet 5 mg (5 mg Oral Given 11/30/21 0410)    ED Course/ Medical Decision Making/ A&P                           Medical Decision Making Amount and/or Complexity of Data Reviewed Labs: ordered. Radiology: ordered.  Risk Prescription drug management. Decision regarding hospitalization.   Patient with history of diabetes here for evaluation of chest pain, abdominal pain.  He has been out of his insulin, unclear how long he has been out of this medication.  Labs significant for hyperglycemia, acute kidney injury, mild hyperkalemia.  He was treated with IV fluid hydration, pain medications and started on insulin for DKA.  Repeat labs with increasing potassium.  His IV fluids were  changed from LR to normal saline.  Repeat EKG  without acute changes.  He does have mild elevation in his transaminases-this is similar when compared to priors.  Plan to admit for ongoing treatment for DKA.  Medicine consulted for admission.       Final Clinical Impression(s) / ED Diagnoses Final diagnoses:  None    Rx / DC Orders ED Discharge Orders     None         Quintella Reichert, MD 11/30/21 [phone]

## 2021-11-30 NOTE — ED Notes (Signed)
Critical Glucose: 794. MD Madilyn Hook made aware.

## 2021-11-30 NOTE — ED Notes (Signed)
Report sent to Fransisca Kaufmann, RN

## 2021-11-30 NOTE — H&P (Addendum)
History and Physical    Patient: [name] [name]. XTG:[id number] DOB: 2001-06-30 DOA: 11/30/2021 DOS: the patient was seen and examined on 11/30/2021 PCP: Pcp, No  Patient coming from: Homeless; NOK: Wife, [name] [name], [phone]   Chief Complaint: Chest pain  HPI: [name] [name]. is a 21 y.o. male with medical history significant of ADD/anxiety/depression/ODD; seizure d/o; polysubstance abuse; homelessness; and T1DM presenting with chest pain. He was recently admitted with SBO and DKA with SI and then again from 5/21-24 with DKA and suicidal ideation.  He was seen by psychiatry during that visit and was recommended for outpatient psych f/u.   He has had 5 admissions and 3 ER visits in the last 6 months.  He is currently too somnolent to provide further history.    ER Course:  Carryover, per Dr. Nevada Crane:  21 y.o. male who presents from home with complaints of constant left sided chest pain that started yesterday.  No fever.  Nausea without vomiting.  No diarrhea.  No dysuria.  No leg swelling.  Uses tobacco, occasionally uses alcohol.  No hx/o DVT/PE.  Ran out of insulin - unsure when.  Work up in the ED revealed DKA type1 with high anion metabolic acidosis.      Review of Systems: unable to review all systems due to the inability of the patient to answer questions. Past Medical History:  Diagnosis Date   ADD (attention deficit disorder)    Allergy    Anxiety    Asthma    Depression    Epileptic seizure ([place])    "both petit and grand mal; last sz ~ 2 wk ago" (06/17/2013)   HWEXHBZJ(696.7)    "2-3 times/wk usually" (06/17/2013)   Heart murmur    "heard a slight one earlier today" (06/17/2013)   Homeless    Migraine    "maybe once/month; it's severe" (06/17/2013)   ODD (oppositional defiant disorder)    Sickle cell trait ([place])    Type I diabetes mellitus ([place])    "that's what they're thinking now" (06/17/2013)   Vision abnormalities    "takes him longer to focus cause;  from his sz" (06/17/2013)   Past Surgical History:  Procedure Laterality Date   CIRCUMCISION  2000   FINGER SURGERY Left 2001   "crushed pinky; had to repair it" (06/17/2013)   Social History:  reports that he has been smoking cigarettes. He has been smoking an average of .5 packs per day. He has never used smokeless tobacco. He reports current drug use. Drug: Methamphetamines. He reports that he does not drink alcohol.  Allergies  Allergen Reactions   Bee Venom Anaphylaxis   Fish Allergy Anaphylaxis   Shellfish Allergy Anaphylaxis   Tea Anaphylaxis    Family History  Problem Relation Age of Onset   Asthma Mother    COPD Mother    Other Mother        possible autoimmune, unclear   Migraines Sister        Hemiplegic Migraines    Diabetes Maternal Grandmother    Heart disease Maternal Grandmother    Hypertension Maternal Grandmother    Mental illness Maternal Grandmother    Heart disease Maternal Grandfather    Hyperlipidemia Paternal Grandmother    Hyperlipidemia Paternal Grandfather     Prior to Admission medications   Medication Sig Start Date End Date Taking? Authorizing Provider  Accu-Chek Softclix Lancets lancets Use to test blood sugars in the morning and at bed time. 11/13/21  Shelly Coss, MD  blood glucose meter kit and supplies KIT Dispense based on patient and insurance preference. Use up to four times daily as directed. (FOR ICD-9 250.00, 250.01). 11/09/20   Florencia Reasons, MD  Blood Glucose Monitoring Suppl (ACCU-CHEK GUIDE) w/Device KIT Use as directed 11/13/21   Shelly Coss, MD  carbamazepine (TEGRETOL) 200 MG tablet Take 1 tablet (200 mg total) by mouth every 12 (twelve) hours. Patient not taking: Reported on 11/11/2021 08/15/21 12/07/21  Briant Cedar, MD  chlorproMAZINE (THORAZINE) 10 MG tablet Take 1 tablet (10 mg total) by mouth 2 (two) times daily. 11/13/21 12/13/21  Shelly Coss, MD  chlorproMAZINE (THORAZINE) 25 MG tablet Take 1 tablet (25 mg  total) by mouth at bedtime. 11/13/21 12/13/21  Shelly Coss, MD  EPINEPHrine 0.3 mg/0.3 mL IJ SOAJ injection Inject 0.3 mg into the muscle once as needed for anaphylaxis (severe allergic reaction). 05/02/20   [provider]  glucose blood (ACCU-CHEK GUIDE) test strip Use to test blood sugars in the morning and bed time. 11/13/21   Shelly Coss, MD  insulin aspart (NOVOLOG FLEXPEN) 100 UNIT/ML FlexPen Inject 8 Units into the skin 3 (three) times daily with meals. 11/20/21   Bonnielee Haff, MD  insulin glargine (LANTUS) 100 UNIT/ML Solostar Pen Inject 40 Units into the skin in the morning and at bedtime. 11/20/21   Bonnielee Haff, MD  Insulin Pen Needle (ADVOCATE INSULIN PEN NEEDLES) 29G X 12.7MM MISC For type I diabetes 11/09/20   Florencia Reasons, MD  Insulin Pen Needle 32G X 4 MM MISC Use to inject insulin 3 times daily as directed. 11/13/21   Shelly Coss, MD  lisinopril (ZESTRIL) 5 MG tablet Take 5 mg by mouth daily.    [provider]  oxyCODONE (OXY IR/ROXICODONE) 5 MG immediate release tablet Take 1 tablet (5 mg total) by mouth every 6 (six) hours as needed for severe pain. 11/08/21   Oswald Hillock, MD  tamsulosin (FLOMAX) 0.4 MG CAPS capsule Take 0.4 mg by mouth daily.    [provider]    Physical Exam: Vitals:   11/30/21 0815 11/30/21 1118 11/30/21 1143 11/30/21 1151  BP: 132/75 (!) 145/85  125/70  Pulse: (!) 102 94  86  Resp: _0 Temp:  97.8 F (36.6 C)  97.9 F (36.6 C)  TempSrc:  Oral  Oral  SpO2: 99% 100%  100%  Weight:   78 kg   Height:   _1  (1.803 m)    General:  Appears very somnolent, briefly awakens; response to questions is inconsistent, answers don't appear to be correct.  Has green hair and multiple tattoos and piercings. Eyes:   EOMI, normal lids, iris ENT:  grossly normal hearing, lips & tongue, mildly dry mm; appropriate dentition Neck:  no LAD, masses or thyromegaly Cardiovascular:  RR with mild tachycardia, no m/r/g. No LE  edema.  Respiratory:   CTA bilaterally with no wheezes/rales/rhonchi.  Normal to mildly increased respiratory effort. Abdomen:  soft, NT, ND Skin:  no rash or induration seen on limited exam, many tattoos Musculoskeletal:  grossly normal tone BUE/BLE, good ROM, no bony abnormality Lower extremity:  No LE edema.  Limited foot exam with no ulcerations.  2+ distal pulses. Psychiatric:  somnolent mood and affect Neurologic: unable to perform   Radiological Exams on Admission: Independently reviewed - see discussion in A/P where applicable  DG Chest Port 1 View  Result Date: 11/30/2021 CLINICAL DATA:  Chest pain. EXAM: PORTABLE CHEST  1 VIEW COMPARISON:  PA Lat 11/11/2021 FINDINGS: The heart size and mediastinal contours are within normal limits. Both lungs are clear. The visualized skeletal structures are unremarkable. There are multiple overlying monitor wires. IMPRESSION: No active disease.  Stable chest. Electronically Signed   By: Telford Nab M.D.   On: 11/30/2021 04:05    EKG: Independently reviewed.   0243 - Sinus tachycardia with rate 133; nonspecific ST changes that may be rate related 0522 - Sinus tachycardia with rate 111; nonspecific ST changes that may be rate related   Labs on Admission: I have personally reviewed the available labs and imaging studies at the time of the admission.  Pertinent labs:    VBG: 7.222/200.8/8.6 Na++ 125 K+ 6.8 CO2 7 Glucose 774 BUN 17/Creatinine 1.71/GFR 57 Anion gap 29 AST 98/ALT 215/Bili 2.7 HS troponin 5, 4 Normal CBC Beta-hydroxybutyrate >8 UA: >500 glucose, 80 ketones   Assessment and Plan: Principal Problem:   DKA, type 1 (HCC) Active Problems:   Hep C w/o coma, chronic (HCC)   Suicidal ideation   Polysubstance abuse (HCC)   Abnormal transaminases   Tobacco abuse   AKI (acute kidney injury) ([place])    DKA -Patient with poor baseline control (A1c 8.8 on 2/14) -He has had frequent admissions for DKA, also with recent  SI -No indication of illness as source -Otherwise no apparent reason for DKA -Severe DKA on admission based on pH 7.222, HCO3 <10, anion gap >12, patient stuporous -Will admit to SDU with DKA protocol -K+ slightly increased at time of presentation but lowered to 5 quickly and so potassium supplementation added -IVF at 150 cc/hr, LR until glucose <250 and then decrease rate to 125 and change to D5LR -Diabetes coordinator and dietician consulted  *Note: his DKA improved faster than anticipated; will transition to basal-bolus insulin at this time.   Recent Suicidal ideation/schizoaffective disorder -Has been thinking about shooting himself recently per last admission but does not have a gun -Psychiatry was consulted but did not feel that the patient needs inpatient psychiatric care at this time and was cleared by them for discharge.   -Will continue safety precautions for now, as he is too somnolent to report whether he is still having SI -Will order repeat psych consult based on his apparent self-harm behaviors -Continue thorazine  AKI -Normal baseline renal function prior last hospitalization -AKI on presentation, likely associated with dehydration in the setting of DKA -Renal function has normalized and appears to be back to baseline currently  Transaminitis -AST/ALT elevated, but improved from prior admission -Continue to monitor with plan for outpatient f/u -Has known Hep C  Amphetamine use -UDS periodically positive -Will check UDS today  Seizures -Not currently taking Tegretol, will resume  HTN -Holding lisinopril due to AKI -Will add prn IV hydralazine  Tobacco dependence -Encourage cessation.   -This was discussed with the patient and should be reviewed on an ongoing basis.   -Patch ordered at patient request.     Advance Care Planning:   Code Status: Full Code   Consults: Psychiatry; DM coordinator; [facility] team; nutrition  DVT Prophylaxis: Lovenox  Family  Communication: None present; he declined having me call family at the time of admission  Severity of Illness: The appropriate patient status for this patient is INPATIENT. Inpatient status is judged to be reasonable and necessary in order to provide the required intensity of service to ensure the patient's safety. The patient's presenting symptoms, physical exam findings, and initial radiographic and laboratory data  in the context of their chronic comorbidities is felt to place them at high risk for further clinical deterioration. Furthermore, it is not anticipated that the patient will be medically stable for discharge from the hospital within 2 midnights of admission.   * I certify that at the point of admission it is my clinical judgment that the patient will require inpatient hospital care spanning beyond 2 midnights from the point of admission due to high intensity of service, high risk for further deterioration and high frequency of surveillance required.*  Author: Karmen Bongo, MD 11/30/2021 1:43 PM  For on call review [email or web address].

## 2021-12-01 DIAGNOSIS — F314 Bipolar disorder, current episode depressed, severe, without psychotic features: Secondary | ICD-10-CM

## 2021-12-01 LAB — BASIC METABOLIC PANEL
Anion gap: 7 (ref 5–15)
Anion gap: 8 (ref 5–15)
BUN: 10 mg/dL (ref 6–20)
BUN: 7 mg/dL (ref 6–20)
CO2: 24 mmol/L (ref 22–32)
CO2: 24 mmol/L (ref 22–32)
Calcium: 8.3 mg/dL — ABNORMAL LOW (ref 8.9–10.3)
Calcium: 8.2 mg/dL — ABNORMAL LOW (ref 8.9–10.3)
Chloride: 101 mmol/L (ref 98–111)
Chloride: 105 mmol/L (ref 98–111)
Creatinine, Ser: 0.61 mg/dL (ref 0.61–1.24)
Creatinine, Ser: 0.63 mg/dL (ref 0.61–1.24)
GFR, Estimated: >60 mL/min (ref >60)
GFR, Estimated: >60 mL/min (ref >60)
Glucose, Bld: 261 mg/dL — ABNORMAL HIGH (ref 70–99)
Glucose, Bld: 92 mg/dL (ref 70–99)
Potassium: 3.5 mmol/L (ref 3.5–5.1)
Potassium: 3.4 mmol/L — ABNORMAL LOW (ref 3.5–5.1)
Sodium: 132 mmol/L — ABNORMAL LOW (ref 135–145)
Sodium: 137 mmol/L (ref 135–145)

## 2021-12-01 LAB — GLUCOSE, CAPILLARY
Glucose-Capillary: 312 mg/dL — ABNORMAL HIGH (ref 70–99)
Glucose-Capillary: 414 mg/dL — ABNORMAL HIGH (ref 70–99)
Glucose-Capillary: 143 mg/dL — ABNORMAL HIGH (ref 70–99)
Glucose-Capillary: 259 mg/dL — ABNORMAL HIGH (ref 70–99)
Glucose-Capillary: 297 mg/dL — ABNORMAL HIGH (ref 70–99)

## 2021-12-01 LAB — PHOSPHORUS: Phosphorus: 3 mg/dL (ref 2.5–4.6)

## 2021-12-01 LAB — MAGNESIUM: Magnesium: 1.8 mg/dL (ref 1.7–2.4)

## 2021-12-01 MED ORDER — LISINOPRIL 10 MG PO TABS
10.0000 mg | ORAL_TABLET | Freq: Every day | ORAL | Status: DC
  Administered 2021-12-01 – 2021-12-04 (×4): 10 mg via ORAL
  Filled 2021-12-01 (×4): qty 1

## 2021-12-01 MED ORDER — KETOROLAC TROMETHAMINE 15 MG/ML IJ SOLN
30.0000 mg | Freq: Once | INTRAMUSCULAR | Status: AC
  Administered 2021-12-01: 30 mg via INTRAVENOUS
  Filled 2021-12-01: qty 1
  Filled 2021-12-01: qty 2

## 2021-12-01 MED ORDER — LACTATED RINGERS IV SOLN: INTRAVENOUS | Status: DC

## 2021-12-01 MED ORDER — FLUOXETINE HCL 10 MG PO CAPS
10.0000 mg | ORAL_CAPSULE | Freq: Every day | ORAL | Status: DC
  Administered 2021-12-01 – 2021-12-04 (×4): 10 mg via ORAL
  Filled 2021-12-01 (×4): qty 1

## 2021-12-01 MED ORDER — POTASSIUM CHLORIDE CRYS ER 20 MEQ PO TBCR
40.0000 meq | EXTENDED_RELEASE_TABLET | Freq: Once | ORAL | Status: AC
  Administered 2021-12-01: 40 meq via ORAL
  Filled 2021-12-01: qty 2

## 2021-12-01 NOTE — Discharge Instructions (Addendum)
Carbohydrate Counting For People With Diabetes  Foods with carbohydrates make your blood glucose level go up. Learning how to count carbohydrates can help you control your blood glucose levels. First, identify the foods you eat that contain carbohydrates. Then, using the Foods with Carbohydrates chart, determine about how much carbohydrates are in your meals and snacks. Make sure you are eating foods with fiber, protein, and healthy fat along with your carbohydrate foods. Foods with Carbohydrates The following table shows carbohydrate foods that have about 15 grams of carbohydrate each. Using measuring cups, spoons, or a food scale when you first begin learning about carbohydrate counting can help you learn about the portion sizes you typically eat. The following foods have 15 grams carbohydrate each:  Grains 1 slice bread (1 ounce)  1 small tortilla (6-inch size)   large bagel (1 ounce)  1/3 cup pasta or rice (cooked)   hamburger or hot dog bun ( ounce)   cup cooked cereal   to  cup ready-to-eat cereal  2 taco shells (5-inch size) Fruit 1 small fresh fruit ( to 1 cup)   medium banana  17 small grapes (3 ounces)  1 cup melon or berries   cup canned or frozen fruit  2 tablespoons dried fruit (blueberries, cherries, cranberries, raisins)   cup unsweetened fruit juice  Starchy Vegetables  cup cooked beans, peas, corn, potatoes/sweet potatoes   large baked potato (3 ounces)  1 cup acorn or butternut squash  Snack Foods 3 to 6 crackers  8 potato chips or 13 tortilla chips ( ounce to 1 ounce)  3 cups popped popcorn  Dairy 3/4 cup (6 ounces) nonfat plain yogurt, or yogurt with sugar-free sweetener  1 cup milk  1 cup plain rice, soy, coconut or flavored almond milk Sweets and Desserts  cup ice cream or frozen yogurt  1 tablespoon jam, jelly, pancake syrup, table sugar, or honey  2 tablespoons light pancake syrup  1 inch square of frosted cake or 2 inch square of unfrosted  cake  2 small cookies (2/3 ounce each) or  large cookie  Sometimes you'll have to estimate carbohydrate amounts if you don't know the exact recipe. One cup of mixed foods like soups can have 1 to 2 carbohydrate servings, while some casseroles might have 2 or more servings of carbohydrate. Foods that have less than 20 calories in each serving can be counted as "free" foods. Count 1 cup raw vegetables, or  cup cooked non-starchy vegetables as "free" foods. If you eat 3 or more servings at one meal, then count them as 1 carbohydrate serving.  Foods without Carbohydrates  Not all foods contain carbohydrates. Meat, some dairy, fats, non-starchy vegetables, and many beverages don't contain carbohydrate. So when you count carbohydrates, you can generally exclude chicken, pork, beef, fish, seafood, eggs, tofu, cheese, butter, sour cream, avocado, nuts, seeds, olives, mayonnaise, water, black coffee, unsweetened tea, and zero-calorie drinks. Vegetables with no or low carbohydrate include green beans, cauliflower, tomatoes, and onions. How much carbohydrate should I eat at each meal?  Carbohydrate counting can help you plan your meals and manage your weight. Following are some starting points for carbohydrate intake at each meal. Work with your registered dietitian nutritionist to find the best range that works for your blood glucose and weight.   To Lose Weight To Maintain Weight  Women 2 - 3 carb servings 3 - 4 carb servings  Men 3 - 4 carb servings 4 - 5 carb servings  Checking your   blood glucose after meals will help you know if you need to adjust the timing, type, or number of carbohydrate servings in your meal plan. Achieve and keep a healthy body weight by balancing your food intake and physical activity.  Tips How should I plan my meals?  Plan for half the food on your plate to include non-starchy vegetables, like salad greens, broccoli, or carrots. Try to eat 3 to 5 servings of non-starchy vegetables  every day. Have a protein food at each meal. Protein foods include chicken, fish, meat, eggs, or beans (note that beans contain carbohydrate). These two food groups (non-starchy vegetables and proteins) are low in carbohydrate. If you fill up your plate with these foods, you will eat less carbohydrate but still fill up your stomach. Try to limit your carbohydrate portion to  of the plate.  What fats are healthiest to eat?  Diabetes increases risk for heart disease. To help protect your heart, eat more healthy fats, such as olive oil, nuts, and avocado. Eat less saturated fats like butter, cream, and high-fat meats, like bacon and sausage. Avoid trans fats, which are in all foods that list "partially hydrogenated oil" as an ingredient. What should I drink?  Choose drinks that are not sweetened with sugar. The healthiest choices are water, carbonated or seltzer waters, and tea and coffee without added sugars.  Sweet drinks will make your blood glucose go up very quickly. One serving of soda or energy drink is  cup. It is best to drink these beverages only if your blood glucose is low.  Artificially sweetened, or diet drinks, typically do not increase your blood glucose if they have zero calories in them. Read labels of beverages, as some diet drinks do have carbohydrate and will raise your blood glucose. Label Reading Tips Read Nutrition Facts labels to find out how many grams of carbohydrate are in a food you want to eat. Don't forget: sometimes serving sizes on the label aren't the same as how much food you are going to eat, so you may need to calculate how much carbohydrate is in the food you are serving yourself.   Carbohydrate Counting for People with Diabetes Sample 1-Day Menu  Breakfast  cup yogurt, low fat, low sugar (1 carbohydrate serving)   cup cereal, ready-to-eat, unsweetened (1 carbohydrate serving)  1 cup strawberries (1 carbohydrate serving)   cup almonds ( carbohydrate serving)   Lunch 1, 5 ounce can chunk light tuna  2 ounces cheese, low fat cheddar  6 whole wheat crackers (1 carbohydrate serving)  1 small apple (1 carbohydrate servings)   cup carrots ( carbohydrate serving)   cup snap peas  1 cup 1% milk (1 carbohydrate serving)   Evening Meal Stir fry made with: 3 ounces chicken  1 cup brown rice (3 carbohydrate servings)   cup broccoli ( carbohydrate serving)   cup green beans   cup onions  1 tablespoon olive oil  2 tablespoons teriyaki sauce ( carbohydrate serving)  Evening Snack 1 extra small banana (1 carbohydrate serving)  1 tablespoon peanut butter   Carbohydrate Counting for People with Diabetes Vegan Sample 1-Day Menu  Breakfast 1 cup cooked oatmeal (2 carbohydrate servings)   cup blueberries (1 carbohydrate serving)  2 tablespoons flaxseeds  1 cup soymilk fortified with calcium and vitamin D  1 cup coffee  Lunch 2 slices whole wheat bread (2 carbohydrate servings)   cup baked tofu   cup lettuce  2 slices tomato  2 slices avocado     cup baby carrots ( carbohydrate serving)  1 orange (1 carbohydrate serving)  1 cup soymilk fortified with calcium and vitamin D   Evening Meal Burrito made with: 1 6-inch corn tortilla (1 carbohydrate serving)  1 cup refried vegetarian beans (2 carbohydrate servings)   cup chopped tomatoes   cup lettuce   cup salsa  1/3 cup brown rice (1 carbohydrate serving)  1 tablespoon olive oil for rice   cup zucchini   Evening Snack 6 small whole grain crackers (1 carbohydrate serving)  2 apricots ( carbohydrate serving)   cup unsalted peanuts ( carbohydrate serving)    Carbohydrate Counting for People with Diabetes Vegetarian (Lacto-Ovo) Sample 1-Day Menu  Breakfast 1 cup cooked oatmeal (2 carbohydrate servings)   cup blueberries (1 carbohydrate serving)  2 tablespoons flaxseeds  1 egg  1 cup 1% milk (1 carbohydrate serving)  1 cup coffee  Lunch 2 slices whole wheat bread (2 carbohydrate  servings)  2 ounces low-fat cheese   cup lettuce  2 slices tomato  2 slices avocado   cup baby carrots ( carbohydrate serving)  1 orange (1 carbohydrate serving)  1 cup unsweetened tea  Evening Meal Burrito made with: 1 6-inch corn tortilla (1 carbohydrate serving)   cup refried vegetarian beans (1 carbohydrate serving)   cup tomatoes   cup lettuce   cup salsa  1/3 cup brown rice (1 carbohydrate serving)  1 tablespoon olive oil for rice   cup zucchini  1 cup 1% milk (1 carbohydrate serving)  Evening Snack 6 small whole grain crackers (1 carbohydrate serving)  2 apricots ( carbohydrate serving)   cup unsalted peanuts ( carbohydrate serving)    Copyright 2020  Academy of Nutrition and Dietetics. All rights reserved.  Using Nutrition Labels: Carbohydrate  Serving Size  Look at the serving size. All the information on the label is based on this portion. Servings Per Container  The number of servings contained in the package. Guidelines for Carbohydrate  Look at the total grams of carbohydrate in the serving size.  1 carbohydrate choice = 15 grams of carbohydrate. Range of Carbohydrate Grams Per Choice  Carbohydrate Grams/Choice Carbohydrate Choices  6-10   11-20 1  21-25 1  26-35 2  36-40 2  41-50 3  51-55 3  56-65 4  66-70 4  71-80 5    Copyright 2020  Academy of Nutrition and Dietetics. All rights reserved.    SnAcK TiMe! No sugar added DOES NOT mean sugar free! And sugar free DOES NOT mean the snack has less than 10 grams of carbohydrate. Check the label!  Snacks with 0-2 grams of Carbohydrate Eggs (egg salad, boiled eggs, deviled eggs or scrambled eggs) Slices of grilled chicken  Cheese sticks (mozzarella, cheddar, provolone, swiss, [place], etc) Deli [place] and Horticulturist, commercial (2 slices) Tuna salad or chicken salad Dill pickles (2 spears) Sugar-Free Jello Water, diet soda, Crystal Light  Snacks with around 5 grams of Carbohydrate Lettuce  (2 cups) with Ranch Dressing (1 tablespoon) Baby carrots, Bell Peppers, and/or Cucumber Slices (1 cup raw) with Ranch Dressing (2 tablespoons) Celery (3 medium stalks) with Cream Cheese (2 tablespoons) Deli meat and Cheese Roll-ups (3) Black Olives (10-15 large olives) Engineer, mining (1/2 cup) Beef or [place] jerky, cured without sugar (2 large pieces) Sliced avocado (1/2 cup)  Snacks with 5-10 grams of Carbohydrate  cup nuts or sunflower seeds 3 stalks celery with 2 tablespoons peanut butter

## 2021-12-01 NOTE — Progress Notes (Signed)
Patient stated he would like to meet with a psych MD and also wanted to know if MD would let him have visitors and visitor overnight.

## 2021-12-01 NOTE — Progress Notes (Signed)
Nutrition Consult  Received consult for type 1 diabetes diet education.  RD working remotely. Unable to reach patient by phone. Given ongoing psych issues, this may not be the best time for diet education.  RD to attach diabetes diet education handouts to discharge instructions. Recommend outpatient diet education.   Gabriel Rainwater RD, LDN, CNSC Please refer to Amion for contact information.

## 2021-12-01 NOTE — Consult Note (Signed)
[facility] Face-to-Face Psychiatry Consult   Reason for Consult:  ''recurrent Diabetic Keto Acidosis admission, recent SI'' Referring Physician: Huey Bienenstock, MD Patient Identification: [name] [name]. MRN:  [id number] Principal Diagnosis: Severe bipolar I disorder, most recent episode depressed (HCC) Diagnosis:  Principal Problem:   Severe bipolar I disorder, most recent episode depressed (HCC) Active Problems:   DKA, type 1 (HCC)   Suicidal ideation   Polysubstance abuse (HCC)   Abnormal transaminases   Tobacco abuse   Hep C w/o coma, chronic (HCC)   AKI (acute kidney injury) (HCC)   Total Time spent with patient: 1 hour  Subjective:   [name] Beale. is a 21 y.o. male patient admitted with chest pain.  HPI:  Patient is  a 21 y.o. male with medical history significant of Bipolar depression, ADD, Anxiety, ODD,  seizure d/o, and T1DM who presented to the hospital with chest pain and shortness of breath. He was found to have elevated blood sugar secondary to uncontrolled diabetes. Patient reports many stressors in his life which include being homeless, inability to control his diabetes and lack of access to his children because he is going through divorce with their mother. As a result, he reports worsening depression characterized by hopelessness, anhedonia, insomnia, poor appetite, feeling of being worthless and recurrent suicidal thoughts. He reports that he attempted suicide about 2 weeks ago but he was glad he did not die. He states that he used to receive care from [facility] but has not heard from them since he became homeless. Today, he continues to reports being depressed, suicidal and unable to contract for safety. Per chart review, patient has had 5 admissions and 3 ER visits in the last 6 months.     Past Psychiatric History: as above  Risk to Self:  suicidal ideation with no plan Risk to Others:  denies Prior Inpatient Therapy:   yes per patient's  report Prior Outpatient Therapy:  [facility] in the past  Past Medical History:  Past Medical History:  Diagnosis Date   ADD (attention deficit disorder)    Allergy    Anxiety    Asthma    Depression    Epileptic seizure (HCC)    "both petit and grand mal; last sz ~ 2 wk ago" (06/17/2013)   EHMCNOBS(962.8)    "2-3 times/wk usually" (06/17/2013)   Heart murmur    "heard a slight one earlier today" (06/17/2013)   Homeless    Migraine    "maybe once/month; it's severe" (06/17/2013)   ODD (oppositional defiant disorder)    Sickle cell trait (HCC)    Type I diabetes mellitus (HCC)    "that's what they're thinking now" (06/17/2013)   Vision abnormalities    "takes him longer to focus cause; from his sz" (06/17/2013)    Past Surgical History:  Procedure Laterality Date   CIRCUMCISION  2000   FINGER SURGERY Left 2001   "crushed pinky; had to repair it" (06/17/2013)   Family History:  Family History  Problem Relation Age of Onset   Asthma Mother    COPD Mother    Other Mother        possible autoimmune, unclear   Migraines Sister        Hemiplegic Migraines    Diabetes Maternal Grandmother    Heart disease Maternal Grandmother    Hypertension Maternal Grandmother    Mental illness Maternal Grandmother    Heart disease Maternal Grandfather  Hyperlipidemia Paternal Grandmother    Hyperlipidemia Paternal Grandfather    Family Psychiatric  History:  Social History:  Social History   Substance and Sexual Activity  Alcohol Use No   Alcohol/week: 0.0 standard drinks     Social History   Substance and Sexual Activity  Drug Use Yes   Types: Methamphetamines   Comment: 10- 14 times a week    Social History   Socioeconomic History   Marital status: Married    Spouse name: Not on file   Number of children: 1   Years of education: Not on file   Highest education level: Not on file  Occupational History   Occupation: Unemployed  Tobacco Use    Smoking status: Every Day    Packs/day: 0.50    Types: Cigarettes    Last attempt to quit: 06/2020    Years since quitting: 1.4   Smokeless tobacco: Never   Tobacco comments:    Mom and dad smoke outside   Vaping Use   Vaping Use: Former  Substance and Sexual Activity   Alcohol use: No    Alcohol/week: 0.0 standard drinks   Drug use: Yes    Types: Methamphetamines    Comment: 10- 14 times a week   Sexual activity: Yes  Other Topics Concern   Not on file  Social History Narrative    followed by [facility]   Social Determinants of Health   Financial Resource Strain: Not on file  Food Insecurity: Not on file  Transportation Needs: Not on file  Physical Activity: Not on file  Stress: Not on file  Social Connections: Not on file   Additional Social History:    Allergies:   Allergies  Allergen Reactions   Bee Venom Anaphylaxis   Fish Allergy Anaphylaxis   Shellfish Allergy Anaphylaxis   Tea Anaphylaxis    Labs:  Results for orders placed or performed during the hospital encounter of 11/30/21 (from the past 48 hour(s))  Basic metabolic panel     Status: Abnormal   Collection Time: 11/30/21  2:49 AM  Result Value Ref Range   Sodium 124 (L) 135 - 145 mmol/L   Potassium 5.2 (H) 3.5 - 5.1 mmol/L   Chloride 87 (L) 98 - 111 mmol/L   CO2 9 (L) 22 - 32 mmol/L   Glucose, Bld 794 (HH) 70 - 99 mg/dL    Comment: Glucose reference range applies only to samples taken after fasting for at least 8 hours. CRITICAL RESULT CALLED TO, READ BACK BY AND VERIFIED WITH: HOLLIS D,RN 11/30/21 0342 WAYK    BUN 16 6 - 20 mg/dL   Creatinine, Ser 1.61 (H) 0.61 - 1.24 mg/dL   Calcium 9.1 8.9 - 09.6 mg/dL   GFR, Estimated 55 (L) >60 mL/min    Comment: (NOTE) Calculated using the CKD-EPI Creatinine Equation (2021)    Anion gap 28 (H) 5 - 15    Comment: REPEATED TO VERIFY Performed at [facility], [address] [place], [place] [address]   CBC     Status: None   Collection Time:  11/30/21  2:49 AM  Result Value Ref Range   WBC 9.1 4.0 - 10.5 K/uL   RBC 5.28 4.22 - 5.81 MIL/uL   Hemoglobin 14.7 13.0 - 17.0 g/dL   HCT 98.1 19.1 - 47.8 %   MCV 85.2 80.0 - 100.0 fL   MCH 27.8 26.0 - 34.0 pg   MCHC 32.7 30.0 - 36.0 g/dL   RDW  12.7 11.5 - 15.5 %   Platelets 337 150 - 400 K/uL   nRBC 0.0 0.0 - 0.2 %    Comment: Performed at [facility] Lab, [address], [place], [place] [address]  Troponin I (High Sensitivity)     Status: None   Collection Time: 11/30/21  2:49 AM  Result Value Ref Range   Troponin I (High Sensitivity) 5 <18 ng/L    Comment: (NOTE) Elevated high sensitivity troponin I (hsTnI) values and significant  changes across serial measurements may suggest ACS but many other  chronic and acute conditions are known to elevate hsTnI results.  Refer to the Links section for chest pain algorithms and additional  guidance. Performed at [facility], [address] [place], [place] [address]   Hepatic function panel     Status: Abnormal   Collection Time: 11/30/21  3:56 AM  Result Value Ref Range   Total Protein 8.8 (H) 6.5 - 8.1 g/dL   Albumin 4.7 3.5 - 5.0 g/dL   AST 98 (H) 15 - 41 U/L   ALT 215 (H) 0 - 44 U/L   Alkaline Phosphatase 136 (H) 38 - 126 U/L   Total Bilirubin 2.7 (H) 0.3 - 1.2 mg/dL   Bilirubin, Direct 0.4 (H) 0.0 - 0.2 mg/dL   Indirect Bilirubin 2.3 (H) 0.3 - 0.9 mg/dL    Comment: Performed at [facility], [address], [place], [place] [address]  Lipase, blood     Status: None   Collection Time: 11/30/21  3:56 AM  Result Value Ref Range   Lipase 22 11 - 51 U/L    Comment: Performed at [facility], [address], [place], [place] [address]  Basic metabolic panel     Status: Abnormal   Collection Time: 11/30/21  3:56 AM  Result Value Ref Range   Sodium 125 (L) 135 - 145 mmol/L   Potassium 6.8 (HH) 3.5 - 5.1 mmol/L    Comment: NO VISIBLE HEMOLYSIS CRITICAL RESULT CALLED TO, READ BACK BY AND VERIFIED  WITH: HOLLIS D,RN 11/30/21 0504 WAYK    Chloride 89 (L) 98 - 111 mmol/L   CO2 7 (L) 22 - 32 mmol/L   Glucose, Bld 774 (HH) 70 - 99 mg/dL    Comment: Glucose reference range applies only to samples taken after fasting for at least 8 hours. CRITICAL RESULT CALLED TO, READ BACK BY AND VERIFIED WITH: HOLLIS D,RN 11/30/21 0504 WAYK    BUN 17 6 - 20 mg/dL   Creatinine, Ser 3.81 (H) 0.61 - 1.24 mg/dL   Calcium 9.3 8.9 - 82.9 mg/dL   GFR, Estimated 57 (L) >60 mL/min    Comment: (NOTE) Calculated using the CKD-EPI Creatinine Equation (2021)    Anion gap 29 (H) 5 - 15    Comment: REPEATED TO VERIFY Performed at [facility] Lab, [address], [place], [place] [address]   Beta-hydroxybutyric acid     Status: Abnormal   Collection Time: 11/30/21  3:56 AM  Result Value Ref Range   Beta-Hydroxybutyric Acid >8.00 (H) 0.05 - 0.27 mmol/L    Comment: RESULTS CONFIRMED BY MANUAL DILUTION Performed at [facility] Lab, [address], [place], [place] [address]   I-Stat venous blood gas, ED     Status: Abnormal   Collection Time: 11/30/21  4:15 AM  Result Value Ref Range   pH, Ven 7.222 (L) 7.25 - 7.43   pCO2, Ven 20.8 (L) 44 -  60 mmHg   pO2, Ven 208 (H) 32 - 45 mmHg   Bicarbonate 8.6 (L) 20.0 - 28.0 mmol/L   TCO2 9 (L) 22 - 32 mmol/L   O2 Saturation 100 %   Acid-base deficit 17.0 (H) 0.0 - 2.0 mmol/L   Sodium 120 (L) 135 - 145 mmol/L   Potassium 6.5 (HH) 3.5 - 5.1 mmol/L   Calcium, Ion 1.07 (L) 1.15 - 1.40 mmol/L   HCT 49.0 39.0 - 52.0 %   Hemoglobin 16.7 13.0 - 17.0 g/dL   Sample type VENOUS    Comment NOTIFIED PHYSICIAN   Troponin I (High Sensitivity)     Status: None   Collection Time: 11/30/21  4:51 AM  Result Value Ref Range   Troponin I (High Sensitivity) 4 <18 ng/L    Comment: (NOTE) Elevated high sensitivity troponin I (hsTnI) values and significant  changes across serial measurements may suggest ACS but many other  chronic and acute conditions are known to elevate  hsTnI results.  Refer to the "Links" section for chest pain algorithms and additional  guidance. Performed at [facility] Lab, [address], [place], [place] [address]   CBG monitoring, ED     Status: Abnormal   Collection Time: 11/30/21  4:55 AM  Result Value Ref Range   Glucose-Capillary >600 (HH) 70 - 99 mg/dL    Comment: Glucose reference range applies only to samples taken after fasting for at least 8 hours.   Comment 1 Call MD NNP PA CNM   Urinalysis, Routine w reflex microscopic Urine, Clean Catch     Status: Abnormal   Collection Time: 11/30/21  5:34 AM  Result Value Ref Range   Color, Urine COLORLESS (A) YELLOW   APPearance CLEAR CLEAR   Specific Gravity, Urine 1.016 1.005 - 1.030   pH 5.0 5.0 - 8.0   Glucose, UA >=500 (A) NEGATIVE mg/dL   Hgb urine dipstick NEGATIVE NEGATIVE   Bilirubin Urine NEGATIVE NEGATIVE   Ketones, ur 80 (A) NEGATIVE mg/dL   Protein, ur NEGATIVE NEGATIVE mg/dL   Nitrite NEGATIVE NEGATIVE   Leukocytes,Ua NEGATIVE NEGATIVE   WBC, UA 0-5 0 - 5 WBC/hpf   Bacteria, UA NONE SEEN NONE SEEN   Mucus PRESENT     Comment: Performed at [facility] Lab, [address], [place], [place] [address]  CBG monitoring, ED     Status: Abnormal   Collection Time: 11/30/21  5:43 AM  Result Value Ref Range   Glucose-Capillary 528 (HH) 70 - 99 mg/dL    Comment: Glucose reference range applies only to samples taken after fasting for at least 8 hours.   Comment 1 Call MD NNP PA CNM   CBG monitoring, ED     Status: Abnormal   Collection Time: 11/30/21  6:23 AM  Result Value Ref Range   Glucose-Capillary 400 (H) 70 - 99 mg/dL    Comment: Glucose reference range applies only to samples taken after fasting for at least 8 hours.   Comment 1 Call MD NNP PA CNM   Basic metabolic panel     Status: Abnormal   Collection Time: 11/30/21  6:36 AM  Result Value Ref Range   Sodium 132 (L) 135 - 145 mmol/L   Potassium 4.1 3.5 - 5.1 mmol/L   Chloride 101 98 - 111 mmol/L    CO2 13 (L) 22 - 32 mmol/L   Glucose, Bld 334 (H) 70 - 99 mg/dL    Comment: Glucose reference range applies  only to samples taken after fasting for at least 8 hours.   BUN 14 6 - 20 mg/dL   Creatinine, Ser 1.61 (H) 0.61 - 1.24 mg/dL   Calcium 8.7 (L) 8.9 - 10.3 mg/dL   GFR, Estimated >09 >60 mL/min    Comment: (NOTE) Calculated using the CKD-EPI Creatinine Equation (2021)    Anion gap 18 (H) 5 - 15    Comment: Performed at [facility] Lab, [address], [place], [place] [address]  CBG monitoring, ED     Status: Abnormal   Collection Time: 11/30/21  6:49 AM  Result Value Ref Range   Glucose-Capillary 337 (H) 70 - 99 mg/dL    Comment: Glucose reference range applies only to samples taken after fasting for at least 8 hours.   Comment 1 Call MD NNP PA CNM   CBG monitoring, ED     Status: Abnormal   Collection Time: 11/30/21  7:39 AM  Result Value Ref Range   Glucose-Capillary 273 (H) 70 - 99 mg/dL    Comment: Glucose reference range applies only to samples taken after fasting for at least 8 hours.   Comment 1 Document in Chart   CBG monitoring, ED     Status: Abnormal   Collection Time: 11/30/21  8:41 AM  Result Value Ref Range   Glucose-Capillary 238 (H) 70 - 99 mg/dL    Comment: Glucose reference range applies only to samples taken after fasting for at least 8 hours.  CBG monitoring, ED     Status: Abnormal   Collection Time: 11/30/21  9:39 AM  Result Value Ref Range   Glucose-Capillary 190 (H) 70 - 99 mg/dL    Comment: Glucose reference range applies only to samples taken after fasting for at least 8 hours.  Basic metabolic panel     Status: Abnormal   Collection Time: 11/30/21 10:43 AM  Result Value Ref Range   Sodium 134 (L) 135 - 145 mmol/L   Potassium 4.0 3.5 - 5.1 mmol/L   Chloride 103 98 - 111 mmol/L   CO2 21 (L) 22 - 32 mmol/L   Glucose, Bld 181 (H) 70 - 99 mg/dL    Comment: Glucose reference range applies only to samples taken after fasting for at least 8  hours.   BUN 9 6 - 20 mg/dL   Creatinine, Ser 8.11 0.61 - 1.24 mg/dL   Calcium 8.3 (L) 8.9 - 10.3 mg/dL   GFR, Estimated >91 >47 mL/min    Comment: (NOTE) Calculated using the CKD-EPI Creatinine Equation (2021)    Anion gap 10 5 - 15    Comment: Performed at [facility], [address], [place], [place] [address]  CBG monitoring, ED     Status: Abnormal   Collection Time: 11/30/21 10:48 AM  Result Value Ref Range   Glucose-Capillary 177 (H) 70 - 99 mg/dL    Comment: Glucose reference range applies only to samples taken after fasting for at least 8 hours.  Glucose, capillary     Status: Abnormal   Collection Time: 11/30/21 11:53 AM  Result Value Ref Range   Glucose-Capillary 163 (H) 70 - 99 mg/dL    Comment: Glucose reference range applies only to samples taken after fasting for at least 8 hours.  Beta-hydroxybutyric acid     Status: Abnormal   Collection Time: 11/30/21 12:28 PM  Result Value Ref Range   Beta-Hydroxybutyric Acid 1.79 (H) 0.05 - 0.27 mmol/L    Comment: Performed at [facility], [address], [place], [place] [address]  Basic metabolic panel     Status: Abnormal   Collection Time: 11/30/21 12:28 PM  Result Value Ref Range   Sodium 134 (L) 135 - 145 mmol/L   Potassium 3.7 3.5 - 5.1 mmol/L   Chloride 104 98 - 111 mmol/L   CO2 21 (L) 22 - 32 mmol/L   Glucose, Bld 166 (H) 70 - 99 mg/dL    Comment: Glucose reference range applies only to samples taken after fasting for at least 8 hours.   BUN 7 6 - 20 mg/dL   Creatinine, Ser 6.04 0.61 - 1.24 mg/dL   Calcium 8.4 (L) 8.9 - 10.3 mg/dL   GFR, Estimated >54 >09 mL/min    Comment: (NOTE) Calculated using the CKD-EPI Creatinine Equation (2021)    Anion gap 9 5 - 15    Comment: Performed at [facility] Lab, [address], [place], [place] [address]  Glucose, capillary     Status: Abnormal   Collection Time: 11/30/21  1:05 PM  Result Value Ref Range   Glucose-Capillary 154 (H) 70 - 99 mg/dL     Comment: Glucose reference range applies only to samples taken after fasting for at least 8 hours.  Glucose, capillary     Status: Abnormal   Collection Time: 11/30/21  3:04 PM  Result Value Ref Range   Glucose-Capillary 169 (H) 70 - 99 mg/dL    Comment: Glucose reference range applies only to samples taken after fasting for at least 8 hours.  Glucose, capillary     Status: Abnormal   Collection Time: 11/30/21  3:54 PM  Result Value Ref Range   Glucose-Capillary 208 (H) 70 - 99 mg/dL    Comment: Glucose reference range applies only to samples taken after fasting for at least 8 hours.  Glucose, capillary     Status: Abnormal   Collection Time: 11/30/21  5:16 PM  Result Value Ref Range   Glucose-Capillary 390 (H) 70 - 99 mg/dL    Comment: Glucose reference range applies only to samples taken after fasting for at least 8 hours.  Glucose, capillary     Status: Abnormal   Collection Time: 11/30/21  6:43 PM  Result Value Ref Range   Glucose-Capillary 503 (HH) 70 - 99 mg/dL    Comment: Glucose reference range applies only to samples taken after fasting for at least 8 hours.   Comment 1 Notify RN   Beta-hydroxybutyric acid     Status: Abnormal   Collection Time: 11/30/21  8:25 PM  Result Value Ref Range   Beta-Hydroxybutyric Acid 1.01 (H) 0.05 - 0.27 mmol/L    Comment: Performed at [facility] Lab, [address] [place], [place] [address]  Glucose, capillary     Status: Abnormal   Collection Time: 11/30/21  8:40 PM  Result Value Ref Range   Glucose-Capillary 309 (H) 70 - 99 mg/dL    Comment: Glucose reference range applies only to samples taken after fasting for at least 8 hours.  Basic metabolic panel     Status: Abnormal   Collection Time: 12/01/21 12:31 AM  Result Value Ref Range   Sodium 132 (L) 135 - 145 mmol/L   Potassium 3.5 3.5 - 5.1 mmol/L   Chloride 101 98 - 111 mmol/L   CO2 24 22 - 32 mmol/L   Glucose, Bld 261 (H) 70 - 99 mg/dL    Comment: Glucose reference range  applies only to samples  taken after fasting for at least 8 hours.   BUN 10 6 - 20 mg/dL   Creatinine, Ser 4.09 0.61 - 1.24 mg/dL   Calcium 8.3 (L) 8.9 - 10.3 mg/dL   GFR, Estimated >81 >19 mL/min    Comment: (NOTE) Calculated using the CKD-EPI Creatinine Equation (2021)    Anion gap 7 5 - 15    Comment: Performed at [facility] Lab, [address], [place], [place] [address]  Glucose, capillary     Status: Abnormal   Collection Time: 12/01/21  7:53 AM  Result Value Ref Range   Glucose-Capillary 312 (H) 70 - 99 mg/dL    Comment: Glucose reference range applies only to samples taken after fasting for at least 8 hours.  Glucose, capillary     Status: Abnormal   Collection Time: 12/01/21  9:19 AM  Result Value Ref Range   Glucose-Capillary 414 (H) 70 - 99 mg/dL    Comment: Glucose reference range applies only to samples taken after fasting for at least 8 hours.  Glucose, capillary     Status: Abnormal   Collection Time: 12/01/21 12:18 PM  Result Value Ref Range   Glucose-Capillary 143 (H) 70 - 99 mg/dL    Comment: Glucose reference range applies only to samples taken after fasting for at least 8 hours.    Current Facility-Administered Medications  Medication Dose Route Frequency Provider Last Rate Last Admin   carbamazepine (TEGRETOL) tablet 200 mg  200 mg Oral Q12H Jonah Blue, MD   200 mg at 12/01/21 1001   dextrose 50 % solution 0-50 mL  0-50 mL Intravenous PRN Jonah Blue, MD       enoxaparin (LOVENOX) injection 40 mg  40 mg Subcutaneous Q24H Jonah Blue, MD   40 mg at 12/01/21 0845   haloperidol (HALDOL) tablet 5 mg  5 mg Oral BID Opyd, Lavone Neri, MD   5 mg at 12/01/21 1001   hydrALAZINE (APRESOLINE) injection 5 mg  5 mg Intravenous Q4H PRN Jonah Blue, MD       ibuprofen (ADVIL) tablet 200-400 mg  200-400 mg Oral Q4H PRN Opyd, Lavone Neri, MD       insulin aspart (novoLOG) injection 0-15 Units  0-15 Units Subcutaneous TID WC Jonah Blue, MD   11 Units at  12/01/21 0846   insulin aspart (novoLOG) injection 0-5 Units  0-5 Units Subcutaneous QHS Jonah Blue, MD   4 Units at 11/30/21 2134   insulin aspart (novoLOG) injection 4 Units  4 Units Subcutaneous TID WC Jonah Blue, MD   4 Units at 12/01/21 0845   insulin glargine-yfgn (SEMGLEE) injection 25 Units  25 Units Subcutaneous Q24H Jonah Blue, MD   25 Units at 11/30/21 1457   lactated ringers infusion   Intravenous Continuous Elgergawy, Leana Roe, MD 100 mL/hr at 12/01/21 0850 New Bag at 12/01/21 0850   nicotine (NICODERM CQ - dosed in mg/24 hours) patch 14 mg  14 mg Transdermal Daily Jonah Blue, MD       oxyCODONE (Oxy IR/ROXICODONE) immediate release tablet 5 mg  5 mg Oral Q4H PRN Opyd, Lavone Neri, MD   5 mg at 11/30/21 2259    Musculoskeletal: Strength & Muscle Tone: within normal limits Gait & Station: normal Patient leans: N/A   Psychiatric Specialty Exam:  Presentation  General Appearance: Appropriate for Environment  Eye Contact:Good  Speech:Clear and Coherent  Speech Volume:Normal  Handedness:Right   Mood and Affect  Mood:Dysphoric  Affect:Constricted   Thought Process  Thought  Processes:Linear  Descriptions of Associations:Intact  Orientation:Full (Time, Place and Person)  Thought Content:Logical  History of Schizophrenia/Schizoaffective disorder:No  Duration of Psychotic Symptoms:N/A  Hallucinations:Hallucinations: None  Ideas of Reference:None  Suicidal Thoughts:Suicidal Thoughts: Yes, Passive SI Passive Intent and/or Plan: Without Intent; Without Plan  Homicidal Thoughts:Homicidal Thoughts: No   Sensorium  Memory:Immediate Good; Recent Good; Remote Good  Judgment:Poor  Insight:Poor   Executive Functions  Concentration:Good  Attention Span:Good  Recall:Good  Fund of Knowledge:Good  Language:Good   Psychomotor Activity  Psychomotor Activity:Psychomotor Activity: Decreased   Assets  Assets:Communication Skills;  Desire for Improvement   Sleep  Sleep:Sleep: Poor   Physical Exam: Physical Exam Review of Systems  Psychiatric/Behavioral:  Positive for depression and suicidal ideas.   Blood pressure (!) 155/81, pulse 70, temperature 98.2 F (36.8 C), temperature source Oral, resp. rate 17, height 5\' 11"  (1.803 m), weight 78 kg, SpO2 97 %. Body mass index is 23.98 kg/m.  Treatment Plan Summary: 21 y.o. male with with history of uncontrolled diabetes, seizure disorder, Bipolar depression who was admitted for chest. He reports worsening depression and suicidal ideation in the context of poor compliance with medication and lack of access to mental care after he became homeless. He continues to report being depressed, suicidal and inability to contract for safety. As a result, he will benefit from inpatient psychiatric admission after he is medically stabilized.  Recommendations: -Continue Carbamazepine 200 mg bid for mood and seizure -Continue Haldol 5 mg bid for bipolar/mood stabilization -Add low dose Prozac 10 mg daily for depression -Consider social worker consult to facilitate inpatient psychiatric placement after patient is medically cleared   Disposition: Recommend psychiatric Inpatient admission when medically cleared. Supportive therapy provided about ongoing stressors. Psychiatric service will continue to follow patient  21, MD 12/01/2021 1:14 PM

## 2021-12-01 NOTE — Progress Notes (Addendum)
PROGRESS NOTE    [name] [name].  EYC:[id number] DOB: [birth year] DOA: 11/30/2021 PCP: Pcp, No   Chief Complaint  Patient presents with   Chest Pain    Brief Narrative:    [name] [name]. is a 21 y.o. male with medical history significant of ADD/anxiety/depression/ODD; seizure d/o; polysubstance abuse; homelessness; and T1DM presenting with chest pain. He was recently admitted with SBO and DKA with SI and then again from 5/21-24 with DKA and suicidal ideation.  He was seen by psychiatry during that visit and was recommended for outpatient psych f/u.   He has had 5 admissions and 3 ER visits in the last 6 months.  He is currently too somnolent to provide further history.       ER Course:  Carryover, per Dr. Margo Aye:   21 y.o. male who presents from home with complaints of constant left sided chest pain that started yesterday.  No fever.  Nausea without vomiting.  No diarrhea.  No dysuria.  No leg swelling.  Uses tobacco, occasionally uses alcohol.  No hx/o DVT/PE.  Ran out of insulin - unsure when.  Work up in the ED revealed DKA type1 with high anion metabolic acidosis.       Assessment & Plan:   Principal Problem:   Severe bipolar I disorder, most recent episode depressed (HCC) Active Problems:   Hep C w/o coma, chronic (HCC)   DKA, type 1 (HCC)   Suicidal ideation   Polysubstance abuse (HCC)   Abnormal transaminases   Tobacco abuse   AKI (acute kidney injury) (HCC)   Severe DKA -Patient with poor baseline control (A1c 8.8 on 2/14) -He has had frequent admissions for DKA, also with recent SI -No indication of illness as source -DKA most likely due to noncompliance with medication as he reports he did not take any insulin for a few days. -Admitted under DKA pathway, with pH 7.222, HCO3 <10, anion gap >12, patient stuporous -Anion gap has closed, and insulin has been changed to subcu regimen, CBG appears to be improving. -Continue to monitor BMP closely and replete  electrolyte as needed.  Suicidal ideation/schizoaffective disorder -Psychiatry input greatly appreciated, patient endorses suicidal ideations, will need inpatient psych admissions  AKI -Creatinine 1.76 on admission, resolved with IV fluids.    Transaminitis -AST/ALT elevated, but improved from prior admission -Continue to monitor with plan for outpatient f/u -Has known Hep C  Amphetamine use -UDS periodically positive -UDS pending  Seizures -Not currently taking Tegretol, will resume   HTN -Resume lisinopril   Tobacco dependence -Encourage cessation.   -This was discussed with the patient and should be reviewed on an ongoing basis.   -Patch ordered at patient request  DVT prophylaxis: Lovenox Code Status: Full Family Communication: None at bedside Disposition:   Status is: Inpatient Remains inpatient appropriate because: suicide risk  Patient is medically cleared for  inpatient psychiatric admission.   Consultants:  Psychiatry   Subjective:  No significant events overnight as discussed with staff, no nausea, no vomiting, tolerate oral intake.  Objective: Vitals:   11/30/21 2336 12/01/21 0322 12/01/21 0750 12/01/21 1214  BP: (!) 157/90 136/68 (!) 146/94 (!) 155/81  Pulse: 79 76 92 70  Resp: 20 15 19 17   Temp: 98 F (36.7 C) 98.1 F (36.7 C) 98.4 F (36.9 C) 98.2 F (36.8 C)  TempSrc: Oral Oral Oral Oral  SpO2: 99% 100% 100% 97%  Weight:      Height:  Intake/Output Summary (Last 24 hours) at 12/01/2021 1416 Last data filed at 12/01/2021 1031 Gross per 24 hour  Intake 710 ml  Output --  Net 710 ml   Filed Weights   11/30/21 1143  Weight: 78 kg    Examination:  Awake Alert, Oriented X 3, No new F.N deficits, flat  affect Symmetrical Chest wall movement, Good air movement bilaterally, CTAB RRR,No Gallops,Rubs or new Murmurs, No Parasternal Heave +ve B.Sounds, Abd Soft, No tenderness, No rebound - guarding or rigidity. No Cyanosis, Clubbing  or edema, No new Rash or bruise       Data Reviewed: I have personally reviewed following labs and imaging studies  CBC: Recent Labs  Lab 11/30/21 0249 11/30/21 0415  WBC 9.1  --   HGB 14.7 16.7  HCT 45.0 49.0  MCV 85.2  --   PLT 337  --     Basic Metabolic Panel: Recent Labs  Lab 11/30/21 0356 11/30/21 0415 11/30/21 0636 11/30/21 1043 11/30/21 1228 12/01/21 0031  NA 125* 120* 132* 134* 134* 132*  K 6.8* 6.5* 4.1 4.0 3.7 3.5  CL 89*  --  101 103 104 101  CO2 7*  --  13* 21* 21* 24  GLUCOSE 774*  --  334* 181* 166* 261*  BUN 17  --  14 9 7 10   CREATININE 1.71*  --  1.31* 0.80 0.76 0.61  CALCIUM 9.3  --  8.7* 8.3* 8.4* 8.3*    GFR: Estimated Creatinine Clearance: 154.3 mL/min (by C-G formula based on SCr of 0.61 mg/dL).  Liver Function Tests: Recent Labs  Lab 11/30/21 0356  AST 98*  ALT 215*  ALKPHOS 136*  BILITOT 2.7*  PROT 8.8*  ALBUMIN 4.7    CBG: Recent Labs  Lab 11/30/21 1843 11/30/21 2040 12/01/21 0753 12/01/21 0919 12/01/21 1218  GLUCAP 503* 309* 312* 414* 143*     No results found for this or any previous visit (from the past 240 hour(s)).       Radiology Studies: DG Chest Port 1 View  Result Date: 11/30/2021 CLINICAL DATA:  Chest pain. EXAM: PORTABLE CHEST 1 VIEW COMPARISON:  PA Lat 11/11/2021 FINDINGS: The heart size and mediastinal contours are within normal limits. Both lungs are clear. The visualized skeletal structures are unremarkable. There are multiple overlying monitor wires. IMPRESSION: No active disease.  Stable chest. Electronically Signed   By: 11/13/2021 M.D.   On: 11/30/2021 04:05        Scheduled Meds:  carbamazepine  200 mg Oral Q12H   enoxaparin (LOVENOX) injection  40 mg Subcutaneous Q24H   FLUoxetine  10 mg Oral Daily   haloperidol  5 mg Oral BID   insulin aspart  0-15 Units Subcutaneous TID WC   insulin aspart  0-5 Units Subcutaneous QHS   insulin aspart  4 Units Subcutaneous TID WC   insulin  glargine-yfgn  25 Units Subcutaneous Q24H   nicotine  14 mg Transdermal Daily   potassium chloride  40 mEq Oral Once   Continuous Infusions:  lactated ringers 100 mL/hr at 12/01/21 0850     LOS: 1 day      01/31/22, MD [facility]   To contact the attending provider between 7A-7P or the covering provider during after hours 7P-7A, please log into the web site [email or web address] and access using universal [place] password for that web site. If you do not have the password, please call the hospital operator.  12/01/2021, 2:16 PM

## 2021-12-01 NOTE — Progress Notes (Signed)
Cell phone confiscated, patient labile placed, cell phone placed in suicide patient belonging's bin.

## 2021-12-02 LAB — CBC
HCT: 40.2 % (ref 39.0–52.0)
Hemoglobin: 13.8 g/dL (ref 13.0–17.0)
MCH: 28.3 pg (ref 26.0–34.0)
MCHC: 34.3 g/dL (ref 30.0–36.0)
MCV: 82.5 fL (ref 80.0–100.0)
Platelets: 152 10*3/uL (ref 150–400)
RBC: 4.87 MIL/uL (ref 4.22–5.81)
RDW: 12.6 % (ref 11.5–15.5)
WBC: 5.3 10*3/uL (ref 4.0–10.5)
nRBC: 0 % (ref 0.0–0.2)

## 2021-12-02 LAB — BASIC METABOLIC PANEL
Anion gap: 8 (ref 5–15)
BUN: 16 mg/dL (ref 6–20)
CO2: 23 mmol/L (ref 22–32)
Calcium: 8.5 mg/dL — ABNORMAL LOW (ref 8.9–10.3)
Chloride: 101 mmol/L (ref 98–111)
Creatinine, Ser: 0.84 mg/dL (ref 0.61–1.24)
GFR, Estimated: >60 mL/min (ref >60)
Glucose, Bld: 567 mg/dL (ref 70–99)
Potassium: 4.6 mmol/L (ref 3.5–5.1)
Sodium: 132 mmol/L — ABNORMAL LOW (ref 135–145)

## 2021-12-02 LAB — GLUCOSE, CAPILLARY
Glucose-Capillary: >600 mg/dL (ref 70–99)
Glucose-Capillary: 280 mg/dL — ABNORMAL HIGH (ref 70–99)
Glucose-Capillary: 172 mg/dL — ABNORMAL HIGH (ref 70–99)
Glucose-Capillary: 471 mg/dL — ABNORMAL HIGH (ref 70–99)

## 2021-12-02 MED ORDER — LACTATED RINGERS IV BOLUS
1000.0000 mL | Freq: Once | INTRAVENOUS | Status: AC
  Administered 2021-12-02: 1000 mL via INTRAVENOUS

## 2021-12-02 MED ORDER — INSULIN ASPART 100 UNIT/ML IJ SOLN
20.0000 [IU] | Freq: Once | INTRAMUSCULAR | Status: AC
  Administered 2021-12-02: 20 [IU] via SUBCUTANEOUS

## 2021-12-02 MED ORDER — INSULIN ASPART 100 UNIT/ML IJ SOLN
10.0000 [IU] | Freq: Once | INTRAMUSCULAR | Status: AC
  Administered 2021-12-02: 10 [IU] via SUBCUTANEOUS

## 2021-12-02 MED ORDER — HYDROXYZINE HCL 25 MG PO TABS
25.0000 mg | ORAL_TABLET | Freq: Once | ORAL | Status: AC | PRN
  Administered 2021-12-02: 25 mg via ORAL
  Filled 2021-12-02: qty 1

## 2021-12-02 MED ORDER — INSULIN GLARGINE-YFGN 100 UNIT/ML ~~LOC~~ SOLN
30.0000 [IU] | Freq: Two times a day (BID) | SUBCUTANEOUS | Status: DC
  Administered 2021-12-02: 30 [IU] via SUBCUTANEOUS
  Filled 2021-12-02 (×3): qty 0.3

## 2021-12-02 MED ORDER — ALUM & MAG HYDROXIDE-SIMETH 200-200-20 MG/5ML PO SUSP: 30.0000 mL | Freq: Four times a day (QID) | ORAL | Status: DC | PRN

## 2021-12-02 MED ORDER — LACTATED RINGERS BOLUS PEDS
1000.0000 mL | Freq: Once | INTRAVENOUS | Status: DC
Start: 2021-12-02 — End: 2021-12-02

## 2021-12-02 MED ORDER — INSULIN GLARGINE-YFGN 100 UNIT/ML ~~LOC~~ SOLN
30.0000 [IU] | Freq: Every day | SUBCUTANEOUS | Status: DC
  Administered 2021-12-02: 30 [IU] via SUBCUTANEOUS
  Filled 2021-12-02: qty 0.3

## 2021-12-02 NOTE — Progress Notes (Signed)
PROGRESS NOTE    [name] [name].  OJJ:[id number] DOB: August 10, 2000 DOA: 11/30/2021 PCP: Pcp, No   Chief Complaint  Patient presents with   Chest Pain    Brief Narrative:    [name] [name]. is a 21 y.o. male with medical history significant of ADD/anxiety/depression/ODD; seizure d/o; polysubstance abuse; homelessness; and T1DM presenting with chest pain. He was recently admitted with SBO and DKA with SI and then again from 5/21-24 with DKA and suicidal ideation.  He was seen by psychiatry during that visit and was recommended for outpatient psych f/u.   He has had 5 admissions and 3 ER visits in the last 6 months.  He is currently too somnolent to provide further history.       ER Course:  Carryover, per Dr. Margo Aye:   21 y.o. male who presents from home with complaints of constant left sided chest pain that started yesterday.  No fever.  Nausea without vomiting.  No diarrhea.  No dysuria.  No leg swelling.  Uses tobacco, occasionally uses alcohol.  No hx/o DVT/PE.  Ran out of insulin - unsure when.  Work up in the ED revealed DKA type1 with high anion metabolic acidosis.       Assessment & Plan:   Principal Problem:   Severe bipolar I disorder, most recent episode depressed (HCC) Active Problems:   Hep C w/o coma, chronic (HCC)   DKA, type 1 (HCC)   Suicidal ideation   Polysubstance abuse (HCC)   Abnormal transaminases   Tobacco abuse   AKI (acute kidney injury) (HCC)   Severe DKA -Patient with poor baseline control (A1c 8.8 on 2/14) -He has had frequent admissions for DKA, also with recent SI -No indication of illness as source -DKA most likely due to noncompliance with medication as he reports he did not take any insulin for a few days. -Admitted under DKA pathway, with pH 7.222, HCO3 <10, anion gap >12, patient stuporous -Anion gap has closed, and insulin has been changed to subcu regimen, CBG appears to be improving. -Continue to monitor BMP closely and replete  electrolyte as needed. -As blood glucose this morning was significantly elevated at 567, this is due to noncompliant with dialysis, as he did have multiple juices and crackles just before that blood draw, I have discussed with the patient about the, portance of diet compliance, as well his insulin dose has been adjusted were Semglee has been increased to 35 units twice daily.  Suicidal ideation/schizoaffective disorder -Psychiatry input greatly appreciated, patient endorses suicidal ideations, will need inpatient psych admissions  AKI -Creatinine 1.76 on admission, resolved with IV fluids.    Transaminitis -AST/ALT elevated, but improved from prior admission -Continue to monitor with plan for outpatient f/u -Has known Hep C  Amphetamine use -UDS periodically positive -UDS pending  Seizures -Not currently taking Tegretol, will resume   HTN -Resume lisinopril   Tobacco dependence -Encourage cessation.   -This was discussed with the patient and should be reviewed on an ongoing basis.   -Patch ordered at patient request  DVT prophylaxis: Lovenox Code Status: Full Family Communication: None at bedside Disposition:   Status is: Inpatient Remains inpatient appropriate because: suicide risk     Consultants:  Psychiatry   Subjective:  No significant events overnight as discussed with staff, no nausea, no vomiting, tolerate oral intake.  Objective: Vitals:   12/01/21 2329 12/02/21 0300 12/02/21 0758 12/02/21 1010  BP: (!) 155/95 (!) 141/76 (!) 182/127 140/79  Pulse: 99  67 87 92  Resp: 17 14 20 17   Temp: 98.8 F (37.1 C) 98.8 F (37.1 C)    TempSrc: Oral Oral    SpO2: 98% 98% 98% 98%  Weight:      Height:        Intake/Output Summary (Last 24 hours) at 12/02/2021 1208 Last data filed at 12/02/2021 1157 Gross per 24 hour  Intake 4760.85 ml  Output --  Net 4760.85 ml   Filed Weights   11/30/21 1143  Weight: 78 kg    Examination:  Awake Alert, Oriented X 3,  No new F.N deficits, flat  affect Symmetrical Chest wall movement, Good air movement bilaterally, CTAB RRR,No Gallops,Rubs or new Murmurs, No Parasternal Heave +ve B.Sounds, Abd Soft, No tenderness, No rebound - guarding or rigidity. No Cyanosis, Clubbing or edema, No new Rash or bruise        Data Reviewed: I have personally reviewed following labs and imaging studies  CBC: Recent Labs  Lab 11/30/21 0249 11/30/21 0415 12/02/21 0025  WBC 9.1  --  5.3  HGB 14.7 16.7 13.8  HCT 45.0 49.0 40.2  MCV 85.2  --  82.5  PLT 337  --  152    Basic Metabolic Panel: Recent Labs  Lab 11/30/21 1043 11/30/21 1228 12/01/21 0031 12/01/21 1452 12/02/21 0541  NA 134* 134* 132* 137 132*  K 4.0 3.7 3.5 3.4* 4.6  CL 103 104 101 105 101  CO2 21* 21* 24 24 23   GLUCOSE 181* 166* 261* 92 567*  BUN 9 7 10 7 16   CREATININE 0.80 0.76 0.61 0.63 0.84  CALCIUM 8.3* 8.4* 8.3* 8.2* 8.5*  MG  --   --   --  1.8  --   PHOS  --   --   --  3.0  --     GFR: Estimated Creatinine Clearance: 146.9 mL/min (by C-G formula based on SCr of 0.84 mg/dL).  Liver Function Tests: Recent Labs  Lab 11/30/21 0356  AST 98*  ALT 215*  ALKPHOS 136*  BILITOT 2.7*  PROT 8.8*  ALBUMIN 4.7    CBG: Recent Labs  Lab 12/01/21 1218 12/01/21 1543 12/01/21 2104 12/02/21 0722 12/02/21 1139  GLUCAP 143* 259* 297* >600* 280*     No results found for this or any previous visit (from the past 240 hour(s)).       Radiology Studies: No results found.      Scheduled Meds:  carbamazepine  200 mg Oral Q12H   enoxaparin (LOVENOX) injection  40 mg Subcutaneous Q24H   FLUoxetine  10 mg Oral Daily   haloperidol  5 mg Oral BID   insulin aspart  0-15 Units Subcutaneous TID WC   insulin aspart  0-5 Units Subcutaneous QHS   insulin aspart  4 Units Subcutaneous TID WC   insulin glargine-yfgn  30 Units Subcutaneous BID   lisinopril  10 mg Oral Daily   nicotine  14 mg Transdermal Daily   Continuous  Infusions:  lactated ringers 100 mL/hr at 12/02/21 1103     LOS: 2 days      02/01/22, MD [facility]   To contact the attending provider between 7A-7P or the covering provider during after hours 7P-7A, please log into the web site [email or web address] and access using universal [place] password for that web site. If you do not have the password, please call the hospital operator.  12/02/2021, 12:08 PM

## 2021-12-02 NOTE — Progress Notes (Signed)
Inpatient Diabetes Program Recommendations  AACE/ADA: New Consensus Statement on Inpatient Glycemic Control (2015)  Target Ranges:  Prepandial:   less than 140 mg/dL      Peak postprandial:   less than 180 mg/dL (1-2 hours)      Critically ill patients:  140 - 180 mg/dL   Lab Results  Component Value Date   GLUCAP >600 (HH) 12/02/2021   HGBA1C 8.8 (H) 08/13/2021    Review of Glycemic Control  Latest Reference Range & Units 12/01/21 07:53 12/01/21 09:19 12/01/21 12:18 12/01/21 15:43 12/01/21 21:04 12/02/21 07:22  Glucose-Capillary 70 - 99 mg/dL 450 (H) 388 (H) 828 (H) 259 (H) 297 (H) >600 (HH)   Diabetes history: DM 1 Outpatient Diabetes medications:  Lantus 40 units bid Novolog 8 units tid with meals Current orders for Inpatient glycemic control:  Novolog moderate tid with meals and HS Novolog 4 units tid with meals Semglee 30 units daily  Inpatient Diabetes Program Recommendations:    Note blood glucose>500 mg/dL. Consider increasing Semglee to 30 units bid (on previous admits, patient was taking Semglee 50 units bid).   Will follow.   Thanks,  Beryl Meager, RN, BC-ADM Inpatient Diabetes Coordinator Pager [phone]  (8a-5p)

## 2021-12-02 NOTE — Consult Note (Signed)
[name] Psychiatry Followup Face-to-Face Psychiatric Evaluation   Service Date: December 02, 2021 LOS:  LOS: 2 days    Assessment  [name] [name]. is a 21 y.o. male admitted medically on 11/30/2021  2:35 AM for DKA. He carries the psychiatric diagnoses of Bipolar depression, ADD, Anxiety, ODD,  seizure d/o and has a past medical history of T1DM. Psychiatry was consulted for recent SI by Dr. Jonah Blue.    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 He meets criteria for both conditions based on previous evaluations.  Current outpatient psychotropic medications include Tegretol 200 gm BID and Haldol 5 mg BID and historically he has had a positive response to these medications. On initial examination, patient exhibits mood lability and attention seeking behavior. Please see plan below for detailed recommendations.   Diagnoses:  Active Hospital problems: Principal Problem:   Severe bipolar I disorder, most recent episode depressed (HCC) Active Problems:   DKA, type 1 (HCC)   Suicidal ideation   Polysubstance abuse (HCC)   Abnormal transaminases   Tobacco abuse   Hep C w/o coma, chronic (HCC)   AKI (acute kidney injury) (HCC)     Plan  ## Safety and Observation Level:  - Based on my clinical evaluation, I estimate the patient to be at high risk of self harm in the current setting - At this time, we recommend a 1:1 level of observation. This decision is based on my review of the chart including patient's history and current presentation, interview of the patient, mental status examination, and consideration of suicide risk including evaluating suicidal ideation, plan, intent, suicidal or self-harm behaviors, risk factors, and protective factors. This judgment is based on our ability to directly address  suicide risk, implement suicide prevention strategies and develop a safety plan while the patient is in the clinical setting. Please contact our team if there is a concern that risk level has changed.   ## Medications:  --Continue Tegretol 200 mg BID for mood stability --Continue Haldol 5 MG bid for prevention of psychosis and mania --Continue Prozac 10 mg for depression  ## Medical Decision Making Capacity:  Not formally assessed  ## Further Work-up:  -- most recent EKG on 6/3 had QtC of 446 -- Pertinent labwork reviewed earlier this admission includes: BHB >8 and AG of 18 c/w DKA, UDS no obtained  ## Disposition:  --Likely home with outpatient services --Will consider [facility] referral  ##Legal Status VOLUNTARY  Thank you for this consult request. Recommendations have been communicated to the primary team.  We will follow at this time.   Carlyn Reichert, MD   NEW history  Relevant Aspects of Hospital Course:  Admitted on 11/30/2021 for DKA.  Patient Report:  The patient was seen in the afternoon.  On interview he exhibits a labile mood with a linear and logical thought process. He states "I've been trying to get treatment for months now and I keep getting kicked out or sent away and it's stressful". He states his mental health has been bad. Depression has been the main issue and he endorses a diagnosis of borderline personality disorder. He thinks his biggest problem is being homeless and being told "no" when he is looking for inpatient placement. States he has "poured his heart out" and prior providers have taken that as a joke. Endorses recent hallucinations but does  not want to talk about these. States suicidal thoughts are "constant" but cannot explain or talk about these because of how overwhelming they are. He is afraid that he is going to be told to do outpatient "like always". States "nothing is ever going to get better". 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 He feels like they make things worse. He reports staying and sleeping on the street before he came in. Voices a desire to back to [facility] - enjoyed the therapy, the groups, long-term nature of hospitalization etc. He felt understood and seen.  He reports that he feels his diabetes is out of control and is contributing to his depression.  ROS:  The patient reports suicidal thoughts, which he refuses to elaborate on.  He also reports unspecified hallucinations.   Family History:  The patient's family history includes Asthma in his mother; COPD in his mother; Diabetes in his maternal grandmother; Heart disease in his maternal grandfather and maternal grandmother; Hyperlipidemia in his paternal grandfather and paternal grandmother; Hypertension in his maternal grandmother; Mental illness in his maternal grandmother; Migraines in his sister; Other in his mother.  Medical History: Past Medical History:  Diagnosis Date   ADD (attention deficit disorder)    Allergy    Anxiety    Asthma    Depression    Epileptic seizure (HCC)    "both petit and grand mal; last sz ~ 2 wk ago" (06/17/2013)   YFRTMYTR(173.5)    "2-3 times/wk usually" (06/17/2013)   Heart murmur    "heard a slight one earlier today" (06/17/2013)   Homeless    Migraine    "maybe once/month; it's severe" (06/17/2013)   ODD (oppositional defiant disorder)    Sickle cell trait (HCC)    Type I diabetes mellitus (HCC)    "that's what they're thinking now" (06/17/2013)   Vision abnormalities    "takes him longer to focus cause; from his sz" (06/17/2013)    Surgical History: Past Surgical History:  Procedure Laterality Date   CIRCUMCISION  2000   FINGER SURGERY Left 2001   "crushed pinky; had to repair it" (06/17/2013)    Medications:   Current Facility-Administered Medications:    alum & mag hydroxide-simeth (MAALOX/MYLANTA) 200-200-20 MG/5ML  suspension 30 mL, 30 mL, Oral, Q6H PRN, Elgergawy, Leana Roe, MD   carbamazepine (TEGRETOL) tablet 200 mg, 200 mg, Oral, Q12H, Jonah Blue, MD, 200 mg at 12/02/21 0924   dextrose 50 % solution 0-50 mL, 0-50 mL, Intravenous, PRN, Jonah Blue, MD   enoxaparin (LOVENOX) injection 40 mg, 40 mg, Subcutaneous, Q24H, Jonah Blue, MD, 40 mg at 12/01/21 0845   FLUoxetine (PROZAC) capsule 10 mg, 10 mg, Oral, Daily, Akintayo, Mojeed, MD, 10 mg at 12/02/21 6701   haloperidol (HALDOL) tablet 5 mg, 5 mg, Oral, BID, Opyd, Lavone Neri, MD, 5 mg at 12/02/21 4103   hydrALAZINE (APRESOLINE) injection 5 mg, 5 mg, Intravenous, Q4H PRN, Jonah Blue, MD   ibuprofen (ADVIL) tablet 200-400 mg, 200-400 mg, Oral, Q4H PRN, Opyd, Lavone Neri, MD   insulin aspart (novoLOG) injection 0-15 Units, 0-15 Units, Subcutaneous, TID WC, Jonah Blue, MD, 3 Units at 12/02/21 1734   insulin aspart (novoLOG) injection 0-5 Units, 0-5 Units, Subcutaneous, QHS, Jonah Blue, MD, 3 Units at 12/01/21 2228   insulin aspart (novoLOG) injection 4 Units, 4 Units, Subcutaneous, TID WC, Jonah Blue, MD, 4 Units at 12/02/21 1734  insulin glargine-yfgn (SEMGLEE) injection 30 Units, 30 Units, Subcutaneous, BID, Elgergawy, Leana Roe, MD   lactated ringers infusion, , Intravenous, Continuous, Elgergawy, Leana Roe, MD, Last Rate: 100 mL/hr at 12/02/21 1103, Infusion Verify at 12/02/21 1103   lisinopril (ZESTRIL) tablet 10 mg, 10 mg, Oral, Daily, Elgergawy, Leana Roe, MD, 10 mg at 12/02/21 2878   nicotine (NICODERM CQ - dosed in mg/24 hours) patch 14 mg, 14 mg, Transdermal, Daily, Jonah Blue, MD  Allergies: Allergies  Allergen Reactions   Bee Venom Anaphylaxis   Fish Allergy Anaphylaxis   Shellfish Allergy Anaphylaxis   Tea Anaphylaxis       Objective  Vital signs:  Temp:  [98.2 F (36.8 C)-98.8 F (37.1 C)] 98.2 F (36.8 C) (06/05 1600) Pulse Rate:  [67-99] 73 (06/05 1600) Resp:  [14-23] 14 (06/05 1600) BP:  (139-194)/(76-138) 139/86 (06/05 1600) SpO2:  [98 %] 98 % (06/05 1010)  Psychiatric Specialty Exam:  Presentation  General Appearance: Casual  Eye Contact:Fleeting  Speech:Clear and Coherent  Speech Volume:Normal  Handedness:Right   Mood and Affect  Mood:Dysphoric  Affect:Congruent; Labile   Thought Process  Thought Processes:Linear  Descriptions of Associations:Intact  Orientation:Full (Time, Place and Person)  Thought Content:Logical  History of Schizophrenia/Schizoaffective disorder:No  Duration of Psychotic Symptoms:N/A  Hallucinations:Hallucinations: -- (refuses to specify)  Ideas of Reference:None  Suicidal Thoughts: yes but refuses to specify Homicidal Thoughts:Homicidal Thoughts: No   Sensorium  Memory:Immediate Fair; Recent Fair; Remote Fair  Judgment:Poor  Insight:Poor   Executive Functions  Concentration:Good  Attention Span:Good  Recall:Good  Fund of Knowledge:Good  Language:Good   Psychomotor Activity  Psychomotor Activity:Psychomotor Activity: Increased   Assets  Assets:Financial Resources/Insurance   Sleep  Sleep:Sleep: Fair    Physical Exam: Physical Exam Constitutional:      Appearance: the patient is not toxic-appearing.  Pulmonary:     Effort: Pulmonary effort is normal.  Neurological:     General: No focal deficit present.     Mental Status: the patient is alert and oriented to person, place, and time.   Review of Systems  Respiratory:  Negative for shortness of breath.   Cardiovascular:  Negative for chest pain.  Gastrointestinal:  Negative for abdominal pain, constipation, diarrhea, nausea and vomiting.  Neurological:  Negative for headaches.   Blood pressure 139/86, pulse 73, temperature 98.2 F (36.8 C), temperature source Oral, resp. rate 14, height 5\' 11"  (1.803 m), weight 78 kg, SpO2 98 %. Body mass index is 23.98 kg/m.   , MD PGY-1

## 2021-12-02 NOTE — Progress Notes (Signed)
Date and time results received: 12/02/21 0704  Test: glucose  Critical Value: 567  Name of Provider Notified: Dr. Randol Kern   Orders Received? Or Actions Taken?: Orders Received - See Orders for details

## 2021-12-02 NOTE — Progress Notes (Signed)
Informed sitter that pt wasn't to have anything to eat or drink except water after dinner because his sugars are very high. Sitter stated past sitter didn't tell him that and pt didn't say anything just kept asking for OJ, G.Crackers, and Peanut Butter.

## 2021-12-03 LAB — BASIC METABOLIC PANEL
Anion gap: 13 (ref 5–15)
BUN: 16 mg/dL (ref 6–20)
CO2: 22 mmol/L (ref 22–32)
Calcium: 9.1 mg/dL (ref 8.9–10.3)
Chloride: 100 mmol/L (ref 98–111)
Creatinine, Ser: 0.77 mg/dL (ref 0.61–1.24)
GFR, Estimated: >60 mL/min (ref >60)
Glucose, Bld: 359 mg/dL — ABNORMAL HIGH (ref 70–99)
Potassium: 4 mmol/L (ref 3.5–5.1)
Sodium: 135 mmol/L (ref 135–145)

## 2021-12-03 LAB — GLUCOSE, CAPILLARY
Glucose-Capillary: 479 mg/dL — ABNORMAL HIGH (ref 70–99)
Glucose-Capillary: 313 mg/dL — ABNORMAL HIGH (ref 70–99)
Glucose-Capillary: 53 mg/dL — ABNORMAL LOW (ref 70–99)
Glucose-Capillary: 56 mg/dL — ABNORMAL LOW (ref 70–99)
Glucose-Capillary: 132 mg/dL — ABNORMAL HIGH (ref 70–99)
Glucose-Capillary: 170 mg/dL — ABNORMAL HIGH (ref 70–99)
Glucose-Capillary: 107 mg/dL — ABNORMAL HIGH (ref 70–99)
Glucose-Capillary: 198 mg/dL — ABNORMAL HIGH (ref 70–99)

## 2021-12-03 MED ORDER — IBUPROFEN 400 MG PO TABS: 200.0000 mg | ORAL_TABLET | Freq: Four times a day (QID) | ORAL | Status: DC | PRN

## 2021-12-03 MED ORDER — INSULIN GLARGINE-YFGN 100 UNIT/ML ~~LOC~~ SOLN
30.0000 [IU] | Freq: Two times a day (BID) | SUBCUTANEOUS | Status: DC
  Administered 2021-12-03 – 2021-12-04 (×2): 30 [IU] via SUBCUTANEOUS
  Filled 2021-12-03 (×3): qty 0.3

## 2021-12-03 MED ORDER — TRAZODONE HCL 50 MG PO TABS
50.0000 mg | ORAL_TABLET | Freq: Every evening | ORAL | Status: AC | PRN
  Administered 2021-12-04: 50 mg via ORAL
  Filled 2021-12-03: qty 1

## 2021-12-03 MED ORDER — INSULIN ASPART 100 UNIT/ML IJ SOLN
25.0000 [IU] | Freq: Once | INTRAMUSCULAR | Status: AC
Start: 2021-12-03 — End: 2021-12-03
  Administered 2021-12-03: 25 [IU] via SUBCUTANEOUS

## 2021-12-03 MED ORDER — INSULIN GLARGINE-YFGN 100 UNIT/ML ~~LOC~~ SOLN
40.0000 [IU] | Freq: Two times a day (BID) | SUBCUTANEOUS | Status: DC
  Administered 2021-12-03: 40 [IU] via SUBCUTANEOUS
  Filled 2021-12-03 (×2): qty 0.4

## 2021-12-03 MED ORDER — LACTATED RINGERS IV BOLUS
1000.0000 mL | Freq: Once | INTRAVENOUS | Status: AC
  Administered 2021-12-03: 1000 mL via INTRAVENOUS

## 2021-12-03 MED ORDER — HYDROXYZINE HCL 25 MG PO TABS
25.0000 mg | ORAL_TABLET | Freq: Once | ORAL | Status: AC | PRN
Start: 2021-12-03 — End: 2021-12-03
  Administered 2021-12-03: 25 mg via ORAL
  Filled 2021-12-03: qty 1

## 2021-12-03 NOTE — Progress Notes (Addendum)
PROGRESS NOTE    [name] [name].  BTD:[id number] DOB: 2001/06/20 DOA: 11/30/2021 PCP: Pcp, No   Chief Complaint  Patient presents with   Chest Pain    Brief Narrative:    [name] Bowns. is a 21 y.o. male with medical history significant of ADD/anxiety/depression/ODD; seizure d/o; polysubstance abuse; homelessness; and T1DM presenting with chest pain. He was recently admitted with SBO and DKA with SI and then again from 5/21-24 with DKA and suicidal ideation.  He was seen by psychiatry during that visit and was recommended for outpatient psych f/u.   He has had 5 admissions and 3 ER visits in the last 6 months. -His DKA has resolved, but he remains noncompliant with diet for his CBG is difficult to control, as well he does report suicidal ideation for which psychiatry is following.   Assessment & Plan:   Principal Problem:   Severe bipolar I disorder, most recent episode depressed (HCC) Active Problems:   Hep C w/o coma, chronic (HCC)   DKA, type 1 (HCC)   Suicidal ideation   Polysubstance abuse (HCC)   Abnormal transaminases   Tobacco abuse   AKI (acute kidney injury) (HCC)   Severe DKA -Patient with poor baseline control (A1c 8.8 on 2/14) -He has had frequent admissions for DKA, also with recent SI -No indication of illness as source -DKA most likely due to noncompliance with medication as he reports he did not take any insulin for a few days. -Admitted under DKA pathway, with pH 7.222, HCO3 <10, anion gap >12, patient stuporous -Anion gap has closed, and insulin has been changed to subcu regimen. -Patient CBG remains difficult to control, have discussed with staff, aide does drink a lot of juices and crackers, have discussed with the patient and with staff to try to limit that. -His insulin has been adjusted frequently, he is currently on 40 units of Semglee twice daily, Premeal NovoLog and insulin sliding scale. -CBG significantly elevated this morning, he is at  479, bicarb has been trending down, his anion gap is up to 13, at risk for recurrent DKA, received fluid bolus, adjusted insulin dose and will monitor CBC closely, continue with IV fluids  Addendum: -This afternoon CBG is 54, will decrease his Semglee to 30 units twice daily, will monitor CBG every 4 hours.  Suicidal ideation/schizoaffective disorder -Psychiatry input greatly appreciated, patient endorses suicidal ideations, will need inpatient psych admissions  AKI -Creatinine 1.76 on admission, resolved with IV fluids.    Transaminitis -AST/ALT elevated, but improved from prior admission -Continue to monitor with plan for outpatient f/u -Has known Hep C  Amphetamine use -UDS periodically positive -UDS pending  Seizures -Not currently taking Tegretol, will resume   HTN -Resume lisinopril   Tobacco dependence -Encourage cessation.   -This was discussed with the patient and should be reviewed on an ongoing basis.   -Patch ordered at patient request  DVT prophylaxis: Lovenox Code Status: Full Family Communication: None at bedside Disposition:   Status is: Inpatient Remains inpatient appropriate because: suicide risk     Consultants:  Psychiatry   Subjective:  No significant events overnight as discussed with staff, he denies any complaints today, no nausea, no vomiting.    Objective: Vitals:   12/02/21 1600 12/02/21 2005 12/02/21 2321 12/03/21 0315  BP: 139/86 132/81 (!) 144/90 127/82  Pulse: 73 81 73 70  Resp: 14 20 17 14   Temp: 98.2 F (36.8 C) 98.2 F (36.8 C) 98.2 F (36.8 C)  TempSrc: Oral Oral Oral   SpO2:  97% 99%   Weight:      Height:        Intake/Output Summary (Last 24 hours) at 12/03/2021 1149 Last data filed at 12/03/2021 0715 Gross per 24 hour  Intake 3242.85 ml  Output --  Net 3242.85 ml   Filed Weights   11/30/21 1143  Weight: 78 kg    Examination:  Awake Alert, Oriented X 3, No new F.N deficits, flat affect Symmetrical Chest  wall movement, Good air movement bilaterally, CTAB RRR,No Gallops,Rubs or new Murmurs, No Parasternal Heave +ve B.Sounds, Abd Soft, No tenderness, No rebound - guarding or rigidity. No Cyanosis, Clubbing or edema, No new Rash or bruise        Data Reviewed: I have personally reviewed following labs and imaging studies  CBC: Recent Labs  Lab 11/30/21 0249 11/30/21 0415 12/02/21 0025  WBC 9.1  --  5.3  HGB 14.7 16.7 13.8  HCT 45.0 49.0 40.2  MCV 85.2  --  82.5  PLT 337  --  152    Basic Metabolic Panel: Recent Labs  Lab 11/30/21 1228 12/01/21 0031 12/01/21 1452 12/02/21 0541 12/03/21 0031  NA 134* 132* 137 132* 135  K 3.7 3.5 3.4* 4.6 4.0  CL 104 101 105 101 100  CO2 21* 24 24 23 22   GLUCOSE 166* 261* 92 567* 359*  BUN 7 10 7 16 16   CREATININE 0.76 0.61 0.63 0.84 0.77  CALCIUM 8.4* 8.3* 8.2* 8.5* 9.1  MG  --   --  1.8  --   --   PHOS  --   --  3.0  --   --     GFR: Estimated Creatinine Clearance: 154.3 mL/min (by C-G formula based on SCr of 0.77 mg/dL).  Liver Function Tests: Recent Labs  Lab 11/30/21 0356  AST 98*  ALT 215*  ALKPHOS 136*  BILITOT 2.7*  PROT 8.8*  ALBUMIN 4.7    CBG: Recent Labs  Lab 12/02/21 0722 12/02/21 1139 12/02/21 1555 12/02/21 2202 12/03/21 0900  GLUCAP >600* 280* 172* 471* 479*     No results found for this or any previous visit (from the past 240 hour(s)).       Radiology Studies: No results found.      Scheduled Meds:  carbamazepine  200 mg Oral Q12H   enoxaparin (LOVENOX) injection  40 mg Subcutaneous Q24H   FLUoxetine  10 mg Oral Daily   haloperidol  5 mg Oral BID   insulin aspart  0-15 Units Subcutaneous TID WC   insulin aspart  0-5 Units Subcutaneous QHS   insulin aspart  4 Units Subcutaneous TID WC   insulin glargine-yfgn  40 Units Subcutaneous BID   lisinopril  10 mg Oral Daily   nicotine  14 mg Transdermal Daily   Continuous Infusions:  lactated ringers 100 mL/hr at 12/03/21 1133      LOS: 3 days      02/02/22, MD [facility]   To contact the attending provider between 7A-7P or the covering provider during after hours 7P-7A, please log into the web site [email or web address] and access using universal [place] password for that web site. If you do not have the password, please call the hospital operator.  12/03/2021, 11:49 AM

## 2021-12-03 NOTE — Progress Notes (Signed)
Pt informed MD stated he can have 2 Diet Colas tonight (this shift)

## 2021-12-03 NOTE — Progress Notes (Signed)
Pt stated that outpt psych therapy hasn't worked in the past but at the same time he hasn't been to any doctor recently. Pt states he needs inpt therapy to work.

## 2021-12-03 NOTE — Consult Note (Signed)
[name] Psychiatry Followup Face-to-Face Psychiatric Evaluation   Service Date: December 03, 2021 LOS:  LOS: 3 days    Assessment  [name] [name]. is a 21 y.o. male admitted medically on 11/30/2021  2:35 AM for DKA. He carries the psychiatric diagnoses of Bipolar depression, ADD, Anxiety, ODD,  seizure d/o and has a past medical history of T1DM. Psychiatry was consulted for recent SI by Dr. Jonah [name].    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 He meets criteria for both conditions based on previous evaluations.  Current outpatient psychotropic medications include Tegretol 200 gm BID and Haldol 5 mg BID and historically he has had a positive response to these medications. On initial examination, patient exhibited mood lability and attention seeking behavior. Please see plan below for detailed recommendations.   Diagnoses:  Active Hospital problems: Principal Problem:   Severe bipolar I disorder, most recent episode depressed (HCC) Active Problems:   DKA, type 1 (HCC)   Suicidal ideation   Polysubstance abuse (HCC)   Abnormal transaminases   Tobacco abuse   Hep C w/o coma, chronic (HCC)   AKI (acute kidney injury) (HCC)     Plan  ## Safety and Observation Level:  - Based on my clinical evaluation, I estimate the patient to be at high risk of self harm in the current setting - At this time, we recommend a 1:1 level of observation. This decision is based on my review of the chart including patient's history and current presentation, interview of the patient, mental status examination, and consideration of suicide risk including evaluating suicidal ideation, plan, intent, suicidal or self-harm behaviors, risk factors, and protective factors. This judgment is based on our ability to directly  address suicide risk, implement suicide prevention strategies and develop a safety plan while the patient is in the clinical setting. Please contact our team if there is a concern that risk level has changed.   ## Medications:  --Continue Tegretol 200 mg BID for mood stability --Continue Haldol 5 MG bid for prevention of psychosis and mania --Continue Prozac 10 mg for depression  ## Medical Decision Making Capacity:  Not formally assessed  ## Further Work-up:  -- most recent EKG on 6/3 had QtC of 446 -- Pertinent labwork reviewed earlier this admission includes: BHB >8 and AG of 18 c/w DKA, UDS no obtained  ## Disposition:  --Likely home with outpatient services --Will ask LCSW to help with [facility] referral tomorrow  ##Legal Status VOLUNTARY  Thank you for this consult request. Recommendations have been communicated to the primary team.  We will follow at this time.   [name] Reichert, MD   NEW history  Relevant Aspects of Hospital Course:  Admitted on 11/30/2021 for DKA.  Patient Report:  The patient was seen in the afternoon.  On interview he exhibits a labile mood with a linear and logical thought process. He states "I've been trying to get treatment for months now and I keep getting kicked out or sent away and it's stressful". He states his mental health has been bad. Depression has been the main issue and he endorses a diagnosis of borderline personality disorder. He thinks his biggest problem is being homeless and being told "no" when he is looking for inpatient placement. States he has "poured his heart out" and prior providers have taken that as a joke.  Endorses recent hallucinations but does not want to talk about these. States suicidal thoughts are "constant" but cannot explain or talk about these because of how overwhelming they are. He is afraid that he is going to be told to do outpatient "like always". States "nothing is ever going to get better". 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 He feels like they make things worse. He reports staying and sleeping on the street before he came in. Voices a desire to back to [facility] - enjoyed the therapy, the groups, long-term nature of hospitalization etc. He felt understood and seen.  He reports that he feels his diabetes is out of control and is contributing to his depression.  ROS:  The patient reports suicidal thoughts, which he refuses to elaborate on.  He also reports unspecified hallucinations.  6/6 Patient is found lying bed and is uninterested in engaging in interview. He does take paperwork for a [facility] referral and fills it out in a prompt manner.   Family History:  The patient's family history includes Asthma in his mother; COPD in his mother; Diabetes in his maternal grandmother; Heart disease in his maternal grandfather and maternal grandmother; Hyperlipidemia in his paternal grandfather and paternal grandmother; Hypertension in his maternal grandmother; Mental illness in his maternal grandmother; Migraines in his sister; Other in his mother.  Medical History: Past Medical History:  Diagnosis Date   ADD (attention deficit disorder)    Allergy    Anxiety    Asthma    Depression    Epileptic seizure (HCC)    "both petit and grand mal; last sz ~ 2 wk ago" (06/17/2013)   ZDGLOVFI(433.2)    "2-3 times/wk usually" (06/17/2013)   Heart murmur    "heard a slight one earlier today" (06/17/2013)   Homeless    Migraine    "maybe once/month; it's severe" (06/17/2013)   ODD (oppositional defiant disorder)    Sickle cell trait (HCC)    Type I diabetes mellitus (HCC)    "that's what they're thinking now" (06/17/2013)   Vision abnormalities    "takes him longer to focus cause; from his sz" (06/17/2013)    Surgical History: Past Surgical History:  Procedure Laterality Date   CIRCUMCISION  2000   FINGER SURGERY Left 2001    "crushed pinky; had to repair it" (06/17/2013)    Medications:   Current Facility-Administered Medications:    alum & mag hydroxide-simeth (MAALOX/MYLANTA) 200-200-20 MG/5ML suspension 30 mL, 30 mL, Oral, Q6H PRN, Elgergawy, Leana Roe, MD   carbamazepine (TEGRETOL) tablet 200 mg, 200 mg, Oral, Q12H, [name] Blue, MD, 200 mg at 12/03/21 0920   dextrose 50 % solution 0-50 mL, 0-50 mL, Intravenous, PRN, [name] Blue, MD   enoxaparin (LOVENOX) injection 40 mg, 40 mg, Subcutaneous, Q24H, [name] Blue, MD, 40 mg at 12/01/21 0845   FLUoxetine (PROZAC) capsule 10 mg, 10 mg, Oral, Daily, Akintayo, Mojeed, MD, 10 mg at 12/03/21 0920   haloperidol (HALDOL) tablet 5 mg, 5 mg, Oral, BID, Opyd, Timothy S, MD, 5 mg at 12/03/21 0920   hydrALAZINE (APRESOLINE) injection 5 mg, 5 mg, Intravenous, Q4H PRN, [name] Blue, MD   ibuprofen (ADVIL) tablet 200 mg, 200 mg, Oral, Q6H PRN, Elgergawy, Leana Roe, MD   insulin aspart (novoLOG) injection 0-15 Units, 0-15 Units, Subcutaneous, TID WC, [name] Blue, MD, 3 Units at 12/03/21 1650   insulin aspart (novoLOG) injection  0-5 Units, 0-5 Units, Subcutaneous, QHS, [name] Blue, MD, 3 Units at 12/01/21 2228   insulin aspart (novoLOG) injection 4 Units, 4 Units, Subcutaneous, TID WC, [name] Blue, MD, 4 Units at 12/03/21 1651   insulin glargine-yfgn (SEMGLEE) injection 30 Units, 30 Units, Subcutaneous, BID, Elgergawy, Leana Roe, MD   lactated ringers infusion, , Intravenous, Continuous, Elgergawy, Leana Roe, MD, Last Rate: 125 mL/hr at 12/03/21 1557, Infusion Verify at 12/03/21 1557   lisinopril (ZESTRIL) tablet 10 mg, 10 mg, Oral, Daily, Elgergawy, Leana Roe, MD, 10 mg at 12/03/21 0920   nicotine (NICODERM CQ - dosed in mg/24 hours) patch 14 mg, 14 mg, Transdermal, Daily, [name] Blue, MD  Allergies: Allergies  Allergen Reactions   Bee Venom Anaphylaxis   Fish Allergy Anaphylaxis   Shellfish Allergy Anaphylaxis   Tea Anaphylaxis        Objective  Vital signs:  Temp:  [98.2 F (36.8 C)] 98.2 F (36.8 C) (06/05 2321) Pulse Rate:  [70-81] 70 (06/06 0315) Resp:  [14-20] 14 (06/06 0315) BP: (127-144)/(73-90) 131/73 (06/06 1217) SpO2:  [97 %-99 %] 99 % (06/05 2321)  Psychiatric Specialty Exam:  Presentation  General Appearance: Casual  Eye Contact:Fleeting  Speech:Clear and Coherent  Speech Volume:Normal  Handedness:Right   Mood and Affect  Mood:Dysphoric  Affect:Congruent; Labile   Thought Process  Thought Processes:Linear  Descriptions of Associations:Intact  Orientation:Full (Time, Place and Person)  Thought Content:Logical  History of Schizophrenia/Schizoaffective disorder:No  Duration of Psychotic Symptoms:N/A  Hallucinations:Hallucinations: -- (refuses to specify)  Ideas of Reference:None  Suicidal Thoughts: yes but refuses to specify Homicidal Thoughts:Homicidal Thoughts: No   Sensorium  Memory:Immediate Fair; Recent Fair; Remote Fair  Judgment:Poor  Insight:Poor   Executive Functions  Concentration:Good  Attention Span:Good  Recall:Good  Fund of Knowledge:Good  Language:Good   Psychomotor Activity  Psychomotor Activity:Psychomotor Activity: Increased   Assets  Assets:Financial Resources/Insurance   Sleep  Sleep:Sleep: Fair    Physical Exam: Physical Exam Constitutional:      Appearance: the patient is not toxic-appearing.  Pulmonary:     Effort: Pulmonary effort is normal.  Neurological:     General: No focal deficit present.     Mental Status: the patient is alert and oriented to person, place, and time.   Review of Systems  Unable to complete  Blood pressure 131/73, pulse 70, temperature 98.2 F (36.8 C), temperature source Oral, resp. rate 14, height 5\' 11"  (1.803 m), weight 78 kg, SpO2 99 %. Body mass index is 23.98 kg/m.   , MD PGY-1

## 2021-12-03 NOTE — Progress Notes (Signed)
CBG at 15:09 - 56  Pt alert and able to take POs.  Small snack given with diet soda.    CBG at 15:23 - 53  Pt alert and able to take POs.  One pack of graham crackers and 2oz of orange juice given.    CBG at 15:45 - 132  Pt requesting additional snacks.  Education provided on DM and having increased thirst/hunger.  Pt verbalized understanding.  Pt resting comfortably.  No additional snacks provided at this time.

## 2021-12-03 NOTE — Progress Notes (Signed)
Pt keeps asking for Diet Cola. Msg sent to Dr. Antionette Char regarding what I was informed during report by [facility] RN which was pt isn't to have any snacks or drinks after meals r/t sugars being out of control and pt almost had to go back on Insulin Drip today. Pt was counciled by Micah Flesher about sugars and them being out of control and what that does to the body and why it is making him so thirsty. There isn't anything written by MD stating the restrictions and nothing in MD notes about the restriction.

## 2021-12-04 ENCOUNTER — Other Ambulatory Visit (HOSPITAL_COMMUNITY): Payer: Self-pay

## 2021-12-04 LAB — GLUCOSE, CAPILLARY
Glucose-Capillary: 111 mg/dL — ABNORMAL HIGH (ref 70–99)
Glucose-Capillary: 101 mg/dL — ABNORMAL HIGH (ref 70–99)
Glucose-Capillary: 321 mg/dL — ABNORMAL HIGH (ref 70–99)

## 2021-12-04 LAB — BASIC METABOLIC PANEL
Anion gap: 10 (ref 5–15)
BUN: 8 mg/dL (ref 6–20)
CO2: 22 mmol/L (ref 22–32)
Calcium: 8.8 mg/dL — ABNORMAL LOW (ref 8.9–10.3)
Chloride: 106 mmol/L (ref 98–111)
Creatinine, Ser: 0.48 mg/dL — ABNORMAL LOW (ref 0.61–1.24)
GFR, Estimated: >60 mL/min (ref >60)
Glucose, Bld: 118 mg/dL — ABNORMAL HIGH (ref 70–99)
Potassium: 3.3 mmol/L — ABNORMAL LOW (ref 3.5–5.1)
Sodium: 138 mmol/L (ref 135–145)

## 2021-12-04 LAB — CBC
HCT: 38.9 % — ABNORMAL LOW (ref 39.0–52.0)
Hemoglobin: 13.6 g/dL (ref 13.0–17.0)
MCH: 28.2 pg (ref 26.0–34.0)
MCHC: 35 g/dL (ref 30.0–36.0)
MCV: 80.7 fL (ref 80.0–100.0)
Platelets: 219 10*3/uL (ref 150–400)
RBC: 4.82 MIL/uL (ref 4.22–5.81)
RDW: 12.2 % (ref 11.5–15.5)
WBC: 6.1 10*3/uL (ref 4.0–10.5)
nRBC: 0 % (ref 0.0–0.2)

## 2021-12-04 MED ORDER — FLUOXETINE HCL 10 MG PO CAPS
10.0000 mg | ORAL_CAPSULE | Freq: Every day | ORAL | 1 refills | Status: DC
  Filled 2021-12-04: qty 30, 30d supply, fill #0

## 2021-12-04 MED ORDER — INSULIN GLARGINE 100 UNIT/ML SOLOSTAR PEN
35.0000 [IU] | PEN_INJECTOR | Freq: Two times a day (BID) | SUBCUTANEOUS | 3 refills | Status: DC
  Filled 2021-12-04: qty 21, 30d supply, fill #0

## 2021-12-04 MED ORDER — INSULIN PEN NEEDLE 32G X 4 MM MISC
3 refills | Status: DC
  Filled 2021-12-04: qty 100, 30d supply, fill #0

## 2021-12-04 MED ORDER — CARBAMAZEPINE 200 MG PO TABS
200.0000 mg | ORAL_TABLET | Freq: Two times a day (BID) | ORAL | 1 refills | Status: DC
  Filled 2021-12-04: qty 60, 30d supply, fill #0

## 2021-12-04 MED ORDER — HALOPERIDOL 5 MG PO TABS
5.0000 mg | ORAL_TABLET | Freq: Two times a day (BID) | ORAL | 1 refills | Status: DC
  Filled 2021-12-04: qty 60, 30d supply, fill #0

## 2021-12-04 MED ORDER — NOVOLOG FLEXPEN 100 UNIT/ML ~~LOC~~ SOPN
8.0000 [IU] | PEN_INJECTOR | Freq: Three times a day (TID) | SUBCUTANEOUS | 3 refills | Status: DC
  Filled 2021-12-04: qty 9, 38d supply, fill #0

## 2021-12-04 MED ORDER — POTASSIUM CHLORIDE CRYS ER 20 MEQ PO TBCR
40.0000 meq | EXTENDED_RELEASE_TABLET | Freq: Once | ORAL | Status: AC
  Administered 2021-12-04: 40 meq via ORAL
  Filled 2021-12-04: qty 2

## 2021-12-04 MED ORDER — LISINOPRIL 10 MG PO TABS
10.0000 mg | ORAL_TABLET | Freq: Every day | ORAL | 1 refills | Status: DC
  Filled 2021-12-04: qty 30, 30d supply, fill #0

## 2021-12-04 MED ORDER — INSULIN PEN NEEDLE 32G X 4 MM MISC
1 refills | Status: DC
  Filled 2021-12-04: qty 100, 30d supply, fill #0

## 2021-12-04 NOTE — Progress Notes (Signed)
CSW received request to fax in [facility] referral for their residential program. CSW obtained form from Dr. Lovette Cliche and faxed to [facility].   Gilmore Laroche, MSW, [facility]

## 2021-12-04 NOTE — Discharge Summary (Signed)
PATIENT DETAILS Name: [name] [name]. Age: 21 y.o. Sex: male Date of Birth: [birth year] MRN: [id number]. Admitting Physician: Kayleen Memos, DO PCP:Pcp, No  Admit Date: 11/30/2021 Discharge date: 12/04/2021  Recommendations for Outpatient Follow-up:  Follow up with PCP in 1-2 weeks Please obtain CMP/CBC in one week Please ensure follow-up with psychiatry  Admitted From:  Home  Disposition: Home   Discharge Condition: fair  CODE STATUS:   Code Status: Full Code   Diet recommendation:  Diet Order             Diet - low sodium heart healthy           Diet Carb Modified           Diet Carb Modified Fluid consistency: Thin; Room service appropriate? Yes  Diet effective now                    Brief Summary: [name] [name]. is a 21 y.o. male with DM-1, bipolar disorder/depression-who has had numerous recent hospitalizations-presenting with suicidal ideation and DKA.  Brief Hospital Course: DKA: Resolved with IV insulin/IVF.  Thought to be due to noncompliance to medications and diet.   DM-1 (A1c 8.8 on 2/14): CBGs relatively stable stable-continue Semglee 30 units twice daily and SSI  Suicidal ideation: Has had numerous recent hospitalization with suicidal ideation.  Per psych note- "While pt certainly has an elevated chronic risk of self-harm due to known diagnoses of depression, borderline personality disorder, and substance abuse, the potential benefits of a short-term hospitalization (if any) are outweighed by the real harms of reinforcing pt's maladaptive coping mechanisms and reliance on inpatient hospitalization".  Referral to long-term voluntary residential program at [facility] has been completed-Per psychiatry-patient is okay to wait in the community for the referral to come through.   Bipolar disorder/depression/borderline personality disorder: Continue Tegretol, Haldol and Prozac.  Psychiatry followed closely.  AKI: Hemodynamically  mediated-resolved.   HTN: BP stable on lisinopril.  Transaminitis: improving-known history of hep C.  Follow LFTs periodically.  Amphetamine use/tobacco use: Cessation encouraged-counseling provided.  BMI: Estimated body mass index is 23.98 kg/m as calculated from the following:   Height as of this encounter: _0  (1.803 m).   Weight as of this encounter: 78 kg.   Discharge Diagnoses:  Principal Problem:   Severe bipolar I disorder, most recent episode depressed ([place]) Active Problems:   Hep C w/o coma, chronic (HCC)   DKA, type 1 (HCC)   Suicidal ideation   Polysubstance abuse ([place])   Abnormal transaminases   Tobacco abuse   AKI (acute kidney injury) [facility])   Discharge Instructions:  Activity:  As tolerated   Discharge Instructions     Call MD for:   Complete by: As directed    Suicidal/homicidal ideation   Call MD for:  difficulty breathing, headache or visual disturbances   Complete by: As directed    Call MD for:  persistant nausea and vomiting   Complete by: As directed    Call MD for:  redness, tenderness, or signs of infection (pain, swelling, redness, odor or green/yellow discharge around incision site)   Complete by: As directed    Call MD for:  severe uncontrolled pain   Complete by: As directed    Diet - low sodium heart healthy   Complete by: As directed    Diet Carb Modified   Complete by: As directed    Discharge instructions   Complete by: As  directed    Follow with Primary MD in 1-2 weeks  [facility]you should be hearing from them soon.  Please take your medications as prescribed.  Please check your CBGs before meals-and keep a record of these readings to take it to your next appointment with your PCP.  Please get a complete blood count and chemistry panel checked by your Primary MD at your next visit, and again as instructed by your Primary MD.  Get Medicines reviewed and adjusted: Please take all  your medications with you for your next visit with your Primary MD  Laboratory/radiological data: Please request your Primary MD to go over all hospital tests and procedure/radiological results at the follow up, please ask your Primary MD to get all Hospital records sent to his/her office.  In some cases, they will be blood work, cultures and biopsy results pending at the time of your discharge. Please request that your primary care M.D. follows up on these results.  Also Note the following: If you experience worsening of your admission symptoms, develop shortness of breath, life threatening emergency, suicidal or homicidal thoughts you must seek medical attention immediately by calling 911 or calling your MD immediately  if symptoms less severe.  You must read complete instructions/literature along with all the possible adverse reactions/side effects for all the Medicines you take and that have been prescribed to you. Take any new Medicines after you have completely understood and accpet all the possible adverse reactions/side effects.   Do not drive when taking Pain medications or sleeping medications (Benzodaizepines)  Do not take more than prescribed Pain, Sleep and Anxiety Medications. It is not advisable to combine anxiety,sleep and pain medications without talking with your primary care practitioner  Special Instructions: If you have smoked or chewed Tobacco  in the last 2 yrs please stop smoking, stop any regular Alcohol  and or any Recreational drug use.  Wear Seat belts while driving.  Please note: You were cared for by a hospitalist during your hospital stay. Once you are discharged, your primary care physician will handle any further medical issues. Please note that NO REFILLS for any discharge medications will be authorized once you are discharged, as it is imperative that you return to your primary care physician (or establish a relationship with a primary care physician if you do  not have one) for your post hospital discharge needs so that they can reassess your need for medications and monitor your lab values.   Increase activity slowly   Complete by: As directed       Allergies as of 12/04/2021       Reactions   Bee Venom Anaphylaxis   Fish Allergy Anaphylaxis   Shellfish Allergy Anaphylaxis   Tea Anaphylaxis        Medication List     STOP taking these medications    chlorproMAZINE 10 MG tablet Commonly known as: THORAZINE   chlorproMAZINE 25 MG tablet Commonly known as: THORAZINE   sertraline 100 MG tablet Commonly known as: ZOLOFT       TAKE these medications    Accu-Chek Guide test strip Generic drug: glucose blood Use to test blood sugars in the morning and bed time.   Accu-Chek Guide w/Device Kit Use as directed   Accu-Chek Softclix Lancets lancets Use to test blood sugars in the morning and at bed time.   Advocate Insulin Pen Needles 29G X 12.7MM Misc Generic drug: Insulin Pen Needle For type I  diabetes   Insulin Pen Needle 32G X 4 MM Misc Use to inject insulin 3 times daily as directed.   blood glucose meter kit and supplies Kit Dispense based on patient and insurance preference. Use up to four times daily as directed. (FOR ICD-9 250.00, 250.01).   carbamazepine 200 MG tablet Commonly known as: TEGRETOL Take 1 tablet (200 mg total) by mouth every 12 (twelve) hours.   EPINEPHrine 0.3 mg/0.3 mL Soaj injection Commonly known as: EPI-PEN Inject 0.3 mg into the muscle once as needed for anaphylaxis (severe allergic reaction).   FLUoxetine 10 MG capsule Commonly known as: PROZAC Take 1 capsule (10 mg total) by mouth daily. Start taking on: December 05, 2021   haloperidol 5 MG tablet Commonly known as: HALDOL Take 1 tablet (5 mg total) by mouth 2 (two) times daily.   insulin glargine 100 UNIT/ML Solostar Pen Commonly known as: LANTUS Inject 35 Units into the skin in the morning and at bedtime. What changed: how much to  take   lisinopril 10 MG tablet Commonly known as: ZESTRIL Take 1 tablet (10 mg total) by mouth daily. Start taking on: December 05, 2021   NovoLOG FlexPen 100 UNIT/ML FlexPen Generic drug: insulin aspart Inject 8 Units into the skin 3 (three) times daily with meals.   oxyCODONE 5 MG immediate release tablet Commonly known as: Oxy IR/ROXICODONE Take 1 tablet (5 mg total) by mouth every 6 (six) hours as needed for severe pain.        Follow-up Information     [place]. Schedule an appointment as soon as possible for a visit.   Why: Call and make an appointment asap to [facility] primary care doctor. they have a pharmacy and finacial couselling Contact information: [place] [phone]               Allergies  Allergen Reactions   Bee Venom Anaphylaxis   Fish Allergy Anaphylaxis   Shellfish Allergy Anaphylaxis   Tea Anaphylaxis     Other Procedures/Studies: CT ABDOMEN PELVIS WO CONTRAST  Result Date: 11/04/2021 CLINICAL DATA:  Abdominal pain, nausea, vomiting EXAM: CT ABDOMEN AND PELVIS WITHOUT CONTRAST TECHNIQUE: Multidetector CT imaging of the abdomen and pelvis was performed following the standard protocol without IV contrast. RADIATION DOSE REDUCTION: This exam was performed according to the departmental dose-optimization program which includes automated exposure control, adjustment of the mA and/or kV according to patient size and/or use of iterative reconstruction technique. COMPARISON:  03/13/2021 FINDINGS: Lower chest: Included lung bases are clear.  Heart size is normal. Hepatobiliary: Hepatomegaly. No focal liver lesion identified on unenhanced images. Gallbladder within normal limits. No hyperdense gallstone. No biliary dilatation. Pancreas: Unremarkable. No pancreatic ductal dilatation or surrounding inflammatory changes. Spleen: Normal in size without focal abnormality.  Adrenals/Urinary Tract: Adrenal glands are unremarkable. Kidneys are normal, without renal calculi, focal lesion, or hydronephrosis. Urinary bladder is markedly distended above the level of the umbilicus. Stomach/Bowel: Stomach is within normal limits. Appendix appears normal (series 2, image 66). Fecalization of distal small bowel contents. No evidence of bowel wall thickening, distention, or inflammatory changes. Vascular/Lymphatic: No significant vascular findings are present. No enlarged abdominal or pelvic lymph nodes. Reproductive: Prostate is unremarkable. Other: No free fluid. No abdominopelvic fluid collection. No pneumoperitoneum. No abdominal wall hernia. Musculoskeletal: Redemonstration of scattered areas of subcentimeter nodularity within the subcutaneous fat of the abdominal wall, similar to prior. Osseous structures within normal limits. IMPRESSION:  1. No acute abdominopelvic findings. 2. Markedly distended urinary bladder above the level of the umbilicus. Correlate for urinary retention. 3. Fecalization of distal small bowel contents suggesting slow transit. No evidence of bowel obstruction. 4. Hepatomegaly. Electronically Signed   By: Davina Poke D.O.   On: 11/04/2021 16:03   DG Chest 2 View  Result Date: 11/11/2021 CLINICAL DATA:  Chest pain. EXAM: CHEST - 2 VIEW COMPARISON:  Nov 04, 2021 FINDINGS: The heart size and mediastinal contours are within normal limits. Low lung volumes are seen. Both lungs are clear. The visualized skeletal structures are unremarkable. IMPRESSION: No active cardiopulmonary disease. Electronically Signed   By: Virgina Norfolk M.D.   On: 11/11/2021 01:48   DG Abd 1 View  Result Date: 11/18/2021 CLINICAL DATA:  Diabetic ketoacidosis, abdominal pain EXAM: ABDOMEN - 1 VIEW COMPARISON:  None Available. FINDINGS: Nonobstructive bowel gas pattern. There is diffuse mucosal fold thickening within multiple aerated loops of small bowel within the left upper quadrant  suggesting underlying enteritis. Gas is seen within nondilated appendiceal lumen. No free intraperitoneal gas. No organomegaly. No acute bone abnormality. IMPRESSION: Mucosal fold thickening within multiple loops of small bowel within the left upper quadrant in keeping with an underlying enteritis. No obstruction. Electronically Signed   By: Fidela Salisbury M.D.   On: 11/18/2021 00:25   US Abdomen Complete  Result Date: 11/05/2021 CLINICAL DATA:  Acute abdominal pain EXAM: ABDOMEN ULTRASOUND COMPLETE COMPARISON:  11/04/2021 CT without contrast FINDINGS: Gallbladder: No gallstones or wall thickening visualized. No sonographic Murphy sign noted by sonographer. Common bile duct: Diameter: 2.7 mm Liver: Normal echogenicity. No focal abnormality. No biliary obstruction pattern. Portal vein is patent on color Doppler imaging with normal direction of blood flow towards the liver. IVC: No abnormality visualized. Pancreas: Visualized portion unremarkable. Spleen: 16.6 cm in length. No focal abnormality. Nonspecific mild splenomegaly. Right Kidney: Length: 11.6 cm. Normal echogenicity and cortical thickness. Slight diffuse pelviectasis. Left Kidney: Length: 12.4 cm. Normal echogenicity and cortical thickness. Similar slight diffuse pelviectasis. Abdominal aorta: Limited assessment because of bowel gas. Normal caliber aorta by comparison CT. Other findings: No free fluid or ascites. Marked bladder distension noted. IMPRESSION: No acute finding by abdominal ultrasound. Mild splenomegaly Bilateral mild pelviectasis, suspect related to bladder distension. Electronically Signed   By: Jerilynn Mages.  Shick M.D.   On: 11/05/2021 17:19   CT ABDOMEN PELVIS W CONTRAST  Result Date: 11/11/2021 CLINICAL DATA:  Right lower quadrant abdominal pain. EXAM: CT ABDOMEN AND PELVIS WITH CONTRAST TECHNIQUE: Multidetector CT imaging of the abdomen and pelvis was performed using the standard protocol following bolus administration of intravenous  contrast. RADIATION DOSE REDUCTION: This exam was performed according to the departmental dose-optimization program which includes automated exposure control, adjustment of the mA and/or kV according to patient size and/or use of iterative reconstruction technique. CONTRAST:  185m OMNIPAQUE IOHEXOL 300 MG/ML  SOLN COMPARISON:  11/04/2021. FINDINGS: Lower chest: No acute abnormality. Hepatobiliary: No focal liver abnormality is seen. The liver is enlarged measuring 24.4 cm in length. No gallstones, gallbladder wall thickening, or biliary dilatation. Pancreas: Unremarkable. No pancreatic ductal dilatation or surrounding inflammatory changes. Spleen: Normal in size without focal abnormality. Adrenals/Urinary Tract: The adrenal glands are within normal limits. No renal calculus or hydronephrosis. A tiny hypodensity is present in the upper pole of the right kidney in the cortex. There is diffuse bladder wall thickening. Stomach/Bowel: The stomach is within normal limits. A few loops of prominent jejunum are noted in the left upper quadrant  is noted in the mid left abdomen measuring 3.2 cm in diameter with possible transition point in the mid left abdomen, coronal image 57. No free air or pneumatosis. A normal appendix is present in the right lower quadrant. Vascular/Lymphatic: No significant vascular findings are present. No enlarged abdominal or pelvic lymph nodes. Reproductive: Prostate is unremarkable. Other: No abdominal wall hernia or abnormality. No abdominopelvic ascites. Musculoskeletal: No acute or significant osseous findings. IMPRESSION: 1. Mildly distended loops of jejunum in the left upper quadrant with questionable transition point in the mid left abdomen, possible early or partial small bowel obstruction. Clinical correlation is recommended. 2. Diffuse bladder wall thickening suggesting infectious or inflammatory cystitis. 3. Vague hypodensity in the cortex of the upper pole the right kidney, possible  pyelonephritis given bladder wall thickening. 4. Hepatomegaly. Electronically Signed   By: Brett Fairy M.D.   On: 11/11/2021 04:20   DG Chest Port 1 View  Result Date: 11/30/2021 CLINICAL DATA:  Chest pain. EXAM: PORTABLE CHEST 1 VIEW COMPARISON:  PA Lat 11/11/2021 FINDINGS: The heart size and mediastinal contours are within normal limits. Both lungs are clear. The visualized skeletal structures are unremarkable. There are multiple overlying monitor wires. IMPRESSION: No active disease.  Stable chest. Electronically Signed   By: Telford Nab M.D.   On: 11/30/2021 04:05   DG Chest Portable 1 View  Result Date: 11/04/2021 CLINICAL DATA:  Chest pain. EXAM: PORTABLE CHEST 1 VIEW COMPARISON:  September 30, 2021. FINDINGS: The heart size and mediastinal contours are within normal limits. Both lungs are clear. The visualized skeletal structures are unremarkable. IMPRESSION: No active disease. Electronically Signed   By: Marijo Conception M.D.   On: 11/04/2021 16:10   DG Abd Portable 1V  Result Date: 11/12/2021 CLINICAL DATA:  SBO (small bowel obstruction) right abdomen pain EXAM: PORTABLE ABDOMEN - 1 VIEW COMPARISON:  CT AP, 11/11/2021. FINDINGS: The bowel gas pattern is normal. No radio-opaque calculi or other significant radiographic abnormality are seen. IMPRESSION: Nonobstructed bowel-gas pattern. Electronically Signed   By: Michaelle Birks M.D.   On: 11/12/2021 08:12     TODAY-DAY OF DISCHARGE:  Subjective:   [name] [name] today has no headache,no chest abdominal pain,no new weakness tingling or numbness, feels much better wants to go home today.   Objective:   Blood pressure 126/80, pulse 72, temperature 98.4 F (36.9 C), temperature source Oral, resp. rate 18, height _0  (1.803 m), weight 78 kg, SpO2 98 %.  Intake/Output Summary (Last 24 hours) at 12/04/2021 1438 Last data filed at 12/03/2021 2245 Gross per 24 hour  Intake 1911.51 ml  Output --  Net 1911.51 ml   Filed Weights   11/30/21 1143   Weight: 78 kg    Exam: Awake Alert, Oriented *3, No new F.N deficits, Normal affect [place].AT,PERRAL Supple Neck,No JVD, No cervical lymphadenopathy appriciated.  Symmetrical Chest wall movement, Good air movement bilaterally, CTAB RRR,No Gallops,Rubs or new Murmurs, No Parasternal Heave +ve B.Sounds, Abd Soft, Non tender, No organomegaly appriciated, No rebound -guarding or rigidity. No Cyanosis, Clubbing or edema, No new Rash or bruise   PERTINENT RADIOLOGIC STUDIES: No results found.   PERTINENT LAB RESULTS: CBC: Recent Labs    12/02/21 0025 12/04/21 0507  WBC 5.3 6.1  HGB 13.8 13.6  HCT 40.2 38.9*  PLT 152 219   CMET CMP     Component Value Date/Time   NA 138 12/04/2021 0507   K 3.3 (L) 12/04/2021 0507   CL 106 12/04/2021 0507  CO2 22 12/04/2021 0507   GLUCOSE 118 (H) 12/04/2021 0507   BUN 8 12/04/2021 0507   CREATININE 0.48 (L) 12/04/2021 0507   CREATININE 0.87 03/31/2016 0001   CALCIUM 8.8 (L) 12/04/2021 0507   PROT 8.8 (H) 11/30/2021 0356   ALBUMIN 4.7 11/30/2021 0356   AST 98 (H) 11/30/2021 0356   ALT 215 (H) 11/30/2021 0356   ALKPHOS 136 (H) 11/30/2021 0356   BILITOT 2.7 (H) 11/30/2021 0356   GFRNONAA >60 12/04/2021 0507   GFRAA >60 02/18/2020 2109    GFR Estimated Creatinine Clearance: 154.3 mL/min (A) (by C-G formula based on SCr of 0.48 mg/dL (L)). No results for input(s): LIPASE, AMYLASE in the last 72 hours. No results for input(s): CKTOTAL, CKMB, CKMBINDEX, TROPONINI in the last 72 hours. Invalid input(s): POCBNP No results for input(s): DDIMER in the last 72 hours. No results for input(s): HGBA1C in the last 72 hours. No results for input(s): CHOL, HDL, LDLCALC, TRIG, CHOLHDL, LDLDIRECT in the last 72 hours. No results for input(s): TSH, T4TOTAL, T3FREE, THYROIDAB in the last 72 hours.  Invalid input(s): FREET3 No results for input(s): VITAMINB12, FOLATE, FERRITIN, TIBC, IRON, RETICCTPCT in the last 72 hours. Coags: No results for  input(s): INR in the last 72 hours.  Invalid input(s): PT Microbiology: No results found for this or any previous visit (from the past 240 hour(s)).  FURTHER DISCHARGE INSTRUCTIONS:  Get Medicines reviewed and adjusted: Please take all your medications with you for your next visit with your Primary MD  Laboratory/radiological data: Please request your Primary MD to go over all hospital tests and procedure/radiological results at the follow up, please ask your Primary MD to get all Hospital records sent to his/her office.  In some cases, they will be blood work, cultures and biopsy results pending at the time of your discharge. Please request that your primary care M.D. goes through all the records of your hospital data and follows up on these results.  Also Note the following: If you experience worsening of your admission symptoms, develop shortness of breath, life threatening emergency, suicidal or homicidal thoughts you must seek medical attention immediately by calling 911 or calling your MD immediately  if symptoms less severe.  You must read complete instructions/literature along with all the possible adverse reactions/side effects for all the Medicines you take and that have been prescribed to you. Take any new Medicines after you have completely understood and accpet all the possible adverse reactions/side effects.   Do not drive when taking Pain medications or sleeping medications (Benzodaizepines)  Do not take more than prescribed Pain, Sleep and Anxiety Medications. It is not advisable to combine anxiety,sleep and pain medications without talking with your primary care practitioner  Special Instructions: If you have smoked or chewed Tobacco  in the last 2 yrs please stop smoking, stop any regular Alcohol  and or any Recreational drug use.  Wear Seat belts while driving.  Please note: You were cared for by a hospitalist during your hospital stay. Once you are discharged, your  primary care physician will handle any further medical issues. Please note that NO REFILLS for any discharge medications will be authorized once you are discharged, as it is imperative that you return to your primary care physician (or establish a relationship with a primary care physician if you do not have one) for your post hospital discharge needs so that they can reassess your need for medications and monitor your lab values.  Total Time spent coordinating discharge  including counseling, education and face to face time equals greater than 30 minutes.  SignedOren Binet 12/04/2021 2:38 PM

## 2021-12-04 NOTE — Progress Notes (Signed)
PROGRESS NOTE    [name]  QE:[id number] DOB: March 05, 2001 DOA: 11/30/2021 PCP: Pcp, No   Chief Complaint  Patient presents with   Chest Pain    Brief Narrative:  Kailas Fryberger. is a 21 y.o. male with DM-1, bipolar disorder/depression-who has had numerous recent hospitalizations-presenting with suicidal ideation and DKA.  Assessment & Plan: DKA: Resolved with IV insulin/IVF.  Thought to be due to noncompliance.  DM-1 (A1c 8.8 on 2/14): CBGs stable-continue Semglee 30 units twice daily, 4 units of NovoLog with meals and SSI.  Recent Labs    12/04/21 0319 12/04/21 0806 12/04/21 1153  GLUCAP 111* 101* 321*    Suicidal ideation: Psych following-sitter in place-felt to be at risk for self-harm by psychiatry-[facility].  Bipolar disorder/depression/borderline personality disorder: Continue Tegretol, Haldol and Prozac.  Psychiatry following.  AKI: Hemodynamically mediated-resolved.  HTN: BP stable on lisinopril.  Transaminitis: improving-known history of hep C.  Follow LFTs periodically.  Amphetamine use/tobacco use: Cessation encouraged-counseling provided.  DVT prophylaxis: Lovenox Code Status: Full Family Communication: None at bedside Disposition:   Status is: Inpatient Remains inpatient appropriate because: suicide risk    Consultants:  Psychiatry   Subjective: Lying comfortably in bed-denies any chest pain or shortness of breath.  Objective: Vitals:   12/03/21 2315 12/04/21 0317 12/04/21 0802 12/04/21 1152  BP: (!) 127/96 111/76 111/74 126/80  Pulse: 60 60 60 72  Resp: 19 14 13 18   Temp: 97.9 F (36.6 C) 98 F (36.7 C) 97.7 F (36.5 C) 98.4 F (36.9 C)  TempSrc: Oral Oral Axillary Oral  SpO2: 98% 98%    Weight:      Height:        Intake/Output Summary (Last 24 hours) at 12/04/2021 1207 Last data filed at 12/03/2021 2245 Gross per 24 hour  Intake 2515.23 ml  Output --  Net 2515.23 ml    Filed  Weights   11/30/21 1143  Weight: 78 kg    Examination: Gen Exam:Alert awake-not in any distress HEENT:atraumatic, normocephalic Chest: B/L clear to auscultation anteriorly CVS:S1S2 regular Abdomen:soft non tender, non distended Extremities:no edema Neurology: Non focal Skin: no rash       Data Reviewed: I have personally reviewed following labs and imaging studies  CBC: Recent Labs  Lab 11/30/21 0249 11/30/21 0415 12/02/21 0025 12/04/21 0507  WBC 9.1  --  5.3 6.1  HGB 14.7 16.7 13.8 13.6  HCT 45.0 49.0 40.2 38.9*  MCV 85.2  --  82.5 80.7  PLT 337  --  152 219     Basic Metabolic Panel: Recent Labs  Lab 12/01/21 0031 12/01/21 1452 12/02/21 0541 12/03/21 0031 12/04/21 0507  NA 132* 137 132* 135 138  K 3.5 3.4* 4.6 4.0 3.3*  CL 101 105 101 100 106  CO2 24 24 23 22 22   GLUCOSE 261* 92 567* 359* 118*  BUN 10 7 16 16 8   CREATININE 0.61 0.63 0.84 0.77 0.48*  CALCIUM 8.3* 8.2* 8.5* 9.1 8.8*  MG  --  1.8  --   --   --   PHOS  --  3.0  --   --   --      GFR: Estimated Creatinine Clearance: 154.3 mL/min (A) (by C-G formula based on SCr of 0.48 mg/dL (L)).  Liver Function Tests: Recent Labs  Lab 11/30/21 0356  AST 98*  ALT 215*  ALKPHOS 136*  BILITOT 2.7*  PROT 8.8*  ALBUMIN 4.7  CBG: Recent Labs  Lab 12/03/21 1955 12/03/21 2319 12/04/21 0319 12/04/21 0806 12/04/21 1153  GLUCAP 107* 198* 111* 101* 321*      No results found for this or any previous visit (from the past 240 hour(s)).       Radiology Studies: No results found.      Scheduled Meds:  carbamazepine  200 mg Oral Q12H   enoxaparin (LOVENOX) injection  40 mg Subcutaneous Q24H   FLUoxetine  10 mg Oral Daily   haloperidol  5 mg Oral BID   insulin aspart  0-15 Units Subcutaneous TID WC   insulin aspart  0-5 Units Subcutaneous QHS   insulin aspart  4 Units Subcutaneous TID WC   insulin glargine-yfgn  30 Units Subcutaneous BID   lisinopril  10 mg Oral Daily    nicotine  14 mg Transdermal Daily   Continuous Infusions:     LOS: 4 days      Oren Binet, MD [facility]   To contact the attending provider between 7A-7P or the covering provider during after hours 7P-7A, please log into the web site [email or web address] and access using universal [place] password for that web site. If you do not have the password, please call the hospital operator.  12/04/2021, 12:07 PM

## 2021-12-04 NOTE — Consult Note (Addendum)
[name]   Service Date: December 04, 2021 LOS:  LOS: 4 days    Assessment  [name]. is a 21 y.o. male admitted medically on 11/30/2021  2:35 AM for DKA. He carries the psychiatric diagnoses of Bipolar depression, ADD, Anxiety, ODD,  seizure d/o and has a past medical history of T1DM. Psychiatry was consulted for recent SI by Dr. Karmen [name].    Depression in the context of borderline personality disorder His current presentation of depression and suicidal ideation is most consistent with depression in the context of borderline personality disorder - unclear if he meets full criteria for borderline personality disorder in absence of sustained periods of sobriety. He meets criteria for both conditions based on previous evaluations.  Current outpatient psychotropic medications include Tegretol 200 gm BID and Haldol 5 mg BID and historically he has had a positive response to these medications; these have been restarted. On initial examination, patient exhibited mood lability and attention seeking behavior - lability improved somewhat through [name] stay.   Ultimately, this is a fairly high-risk pt with numerous (16) ED presentations and/or admissions in the last 6 months for DKA, reported suicidal ideations, or both. He has had at least 2-3 psychiatric admissions in that time frame and on review of available records has not participated in groups, the milieu. He has been restarted on appropriate psychiatric medications this admission. He has in the past fabricated stories in an attempt to gain admission to a psychiatric [name] (including that he saw his mother shoot herself), although did not do that this admission. He has been excessively focused on referral to either inpt psychiatry or to [name] and generally declined to participate in psychiatric interview outside of these objectives. During this and previous  [name] stays he has eaten an excessive amount of high-carb food, resulting in significant hyperglycemia and prolonged hospitalization. He has repeatedly stated that he has no intention of participating in any outpatient form of treatment.  While pt certainly has an elevated chronic risk of self-harm due to known diagnoses of depression, borderline personality disorder, and substance abuse, the potential benefits of a short-term hospitalization (if any) are outweighed by the real harms of reinforcing pt's maladaptive coping mechanisms and reliance on inpatient hospitalization.   During this hospitalization, a referral was filled out to a long-term voluntary, residential program at [name] collaboratively by both myself and the patient in an effort to secure treatment suited to his needs.  On examination on day of discharge, pt remained focused on attaining inpt admission, and refused to talk with psychiatry team about other topics, including presence or absence of suicidal thoughts. He was future oriented throughout interview and was seen to grab his phone mid-interview (after discussing likely dc today pending medical stability) in an attempt to find a place to stay tonight.   Please see plan below for detailed recommendations.    I personally spent 50  minutes on the unit in direct patient care. The direct patient care time included face-to-face time with the patient, reviewing the patient's chart, communicating with other professionals, filling out referrals, and coordinating care. Greater than 50% of this time was spent in counseling or coordinating care with the patient regarding goals of hospitalization, psycho-education, and discharge planning needs.   Diagnoses:  Active [name] problems: Principal Problem:   Severe bipolar I disorder, most recent episode depressed ([name]) Active Problems:   DKA, type 1 ([name])   Suicidal  ideation   Polysubstance abuse (HCC)   Abnormal  transaminases   Tobacco abuse   Hep C w/o coma, chronic (HCC)   AKI ([name] kidney injury) ([name])     Plan  ## Safety and Observation Level:  - Based on my clinical [name], I estimate the patient to be at high risk of self harm in the current setting - Can dc sitter  ## Medications:  --Continue Tegretol 200 mg BID for mood stability --Continue Haldol 5 MG bid for prevention of psychosis and mania --Continue Prozac 10 mg for depression  ## Medical Decision Making Capacity:  Not formally assessed  ## Further Work-up:  -- most recent EKG on 6/3 had QtC of 446 -- Pertinent labwork reviewed earlier this admission includes: BHB >8 and AG of 18 c/w DKA, UDS no obtained  ## Disposition:  --Likely home with outpatient services -- Have given LCSW filled out packet to [name] residential treatment program   ##Legal Status VOLUNTARY  Thank you for this consult request. Recommendations have been communicated to the primary team.  We will follow at this time.   [name]   NEW history  Relevant Aspects of [name] Course:  Admitted on 11/30/2021 for DKA.  Patient Report:  Patient seen in late AM. He is lying in bed with the lights off. He does not know how he feels about his sugars being better. He does not want to talk to the psychiatry team about anything - did endorse wanting to talk about [name]. Discussed voluntary referral to a residential program which he had started yesterday. He asked several follow up questions about this including wait list, what the housing looks like, and how long the program is and if he could be referred to [name] in the meantime.   Discussed reasons for not referring to [name] in meantime - now pt endorsing they had been very helpful with groups, coping skills, med mgmt which is in contrast to report from a couple of days ago. Discussed that these services are paused at [name] currently due to enterovirus outbreak, and that even if he did go to [name] he would  have to be discharged eventually while waiting for placement at the residential program which he acknowledges. He has no interest in outpt services. States "this is the shit that pisses me off. You all just trying to send me outpatient". States "if you are going to discharge me tell me so I can get a game plan going"; when discussed that this is likely he grabs his phone to "get a game plan going" (presumably to secure housing and a ride).   He declines to answer questions about suicidal thoughts.   ROS:  The patient reports suicidal thoughts, which he refuses to elaborate on.  He also reports unspecified hallucinations.  6/6 Patient is found lying bed and is uninterested in engaging in interview. He does take paperwork for a [name] referral and fills it out in a prompt manner.   Family History:  The patient's family history includes Asthma in his mother; COPD in his mother; Diabetes in his maternal grandmother; Heart disease in his maternal grandfather and maternal grandmother; Hyperlipidemia in his paternal grandfather and paternal grandmother; Hypertension in his maternal grandmother; Mental illness in his maternal grandmother; Migraines in his sister; Other in his mother.  Medical History: Past Medical History:  Diagnosis Date   ADD (attention deficit disorder)    Allergy    Anxiety    Asthma    Depression  Epileptic seizure ([place])    "both petit and grand mal; last sz ~ 2 wk ago" (06/17/2013)   Headache(784.0)    "2-3 times/wk usually" (06/17/2013)   Heart murmur    "heard a slight one earlier today" (06/17/2013)   Homeless    Migraine    "maybe once/month; it's severe" (06/17/2013)   ODD (oppositional defiant disorder)    Sickle cell trait ([place])    Type I diabetes mellitus ([place])    "that's what they're thinking now" (06/17/2013)   Vision abnormalities    "takes him longer to focus cause; from his sz" (06/17/2013)    Surgical History: Past Surgical History:  Procedure  Laterality Date   CIRCUMCISION  2000   FINGER SURGERY Left 2001   "crushed pinky; had to repair it" (06/17/2013)    Medications:   Current Facility-Administered Medications:    alum & mag hydroxide-simeth (MAALOX/MYLANTA) 200-200-20 MG/5ML suspension 30 mL, 30 mL, Oral, Q6H PRN, Elgergawy, Silver Huguenin, MD   carbamazepine (TEGRETOL) tablet 200 mg, 200 mg, Oral, Q12H, [name] Bongo, MD, 200 mg at 12/04/21 1001   dextrose 50 % solution 0-50 mL, 0-50 mL, Intravenous, PRN, [name] Bongo, MD   enoxaparin (LOVENOX) injection 40 mg, 40 mg, Subcutaneous, Q24H, [name] Bongo, MD, 40 mg at 12/01/21 0845   FLUoxetine (PROZAC) capsule 10 mg, 10 mg, Oral, Daily, Akintayo, Mojeed, MD, 10 mg at 12/04/21 1002   haloperidol (HALDOL) tablet 5 mg, 5 mg, Oral, BID, Opyd, Timothy S, MD, 5 mg at 12/04/21 1002   hydrALAZINE (APRESOLINE) injection 5 mg, 5 mg, Intravenous, Q4H PRN, [name] Bongo, MD   ibuprofen (ADVIL) tablet 200 mg, 200 mg, Oral, Q6H PRN, Elgergawy, Silver Huguenin, MD   insulin aspart (novoLOG) injection 0-15 Units, 0-15 Units, Subcutaneous, TID WC, [name] Bongo, MD, 3 Units at 12/03/21 1650   insulin aspart (novoLOG) injection 0-5 Units, 0-5 Units, Subcutaneous, QHS, [name] Bongo, MD, 3 Units at 12/01/21 2228   insulin aspart (novoLOG) injection 4 Units, 4 Units, Subcutaneous, TID WC, [name] Bongo, MD, 4 Units at 12/03/21 1651   insulin glargine-yfgn (SEMGLEE) injection 30 Units, 30 Units, Subcutaneous, BID, Elgergawy, Silver Huguenin, MD, 30 Units at 12/04/21 1002   lisinopril (ZESTRIL) tablet 10 mg, 10 mg, Oral, Daily, Elgergawy, Silver Huguenin, MD, 10 mg at 12/04/21 [id number]   nicotine (NICODERM CQ - dosed in mg/24 hours) patch 14 mg, 14 mg, Transdermal, Daily, [name] Bongo, MD  Allergies: Allergies  Allergen Reactions   Bee Venom Anaphylaxis   Fish Allergy Anaphylaxis   Shellfish Allergy Anaphylaxis   Tea Anaphylaxis       Objective  Vital signs:  Temp:  [97.7 F (36.5 C)-98.4 F (36.9  C)] 98.4 F (36.9 C) (06/07 1152) Pulse Rate:  [60-77] 72 (06/07 1152) Resp:  [13-20] 18 (06/07 1152) BP: (111-135)/(74-96) 126/80 (06/07 1152) SpO2:  [98 %-99 %] 98 % (06/07 0317)  Psychiatric Specialty Exam:  Presentation  General Appearance: Casual  Eye Contact:Fleeting  Speech:Clear and Coherent  Speech Volume:Normal  Handedness:Right   Mood and Affect  Mood:Dysphoric  Affect:Congruent; Labile   Thought Process  Thought Processes:Linear  Descriptions of Associations:Intact  Orientation:Full (Time, Place and Person)  Thought Content:Logical  History of Schizophrenia/Schizoaffective disorder:No  Duration of Psychotic Symptoms:N/A  Hallucinations:No data recorded  Ideas of Reference:None  Suicidal Thoughts: yes but refuses to specify Homicidal Thoughts:No data recorded   Sensorium  Memory:Immediate Fair; Recent Fair; Remote Fair  Judgment:Poor  Insight:Poor   Executive Functions  Concentration:Good  Attention Span:Good  Recall:Good  Fund of Knowledge:Good  Language:Good   Psychomotor Activity  Psychomotor Activity:No data recorded   Assets  Assets:Financial Resources/Insurance   Sleep  Sleep:No data recorded    Physical Exam: Physical Exam Constitutional:      Appearance: the patient is not toxic-appearing.  Pulmonary:     Effort: Pulmonary effort is normal.  Neurological:     General: No focal deficit present.     Mental Status: the patient is alert and oriented to person, place, and time.   Review of Systems  Unable to complete due to minimal engagement  Blood pressure 126/80, pulse 72, temperature 98.4 F (36.9 C), temperature source Oral, resp. rate 18, height 5\' 11"  (1.803 m), weight 78 kg, SpO2 98 %. Body mass index is 23.98 kg/m.   Jeral Fruit, MD Attending Psychiatrist

## 2021-12-04 NOTE — Progress Notes (Signed)
   12/04/21 1807  AVS Discharge Documentation  AVS Discharge Instructions Including Medications Provided to patient/caregiver  Name of Person Receiving AVS Discharge Instructions Including Medications [name].  Name of Clinician That Reviewed AVS Discharge Instructions Including Medications Clarnce Flock, RN

## 2021-12-04 NOTE — TOC Benefit Eligibility Note (Signed)
Patient Advocate Encounter   Received notification that prior authorization for Carbamazepine 200 mg tablets is required.   PA submitted on 12/04/2021 Confirmation # [id number] [place] Medicaid Status is pending       Lyndel Safe, [place] Patient Advocate Specialist [place] Patient Advocate Team Direct Number: [phone]  Fax: [phone]

## 2021-12-05 ENCOUNTER — Other Ambulatory Visit: Payer: Self-pay

## 2021-12-05 ENCOUNTER — Observation Stay (HOSPITAL_COMMUNITY)
Admission: EM | Admit: 2021-12-05 | Discharge: 2021-12-06 | Discharge status: Against Medical Advice | Payer: Medicaid Other | Attending: Internal Medicine | Admitting: Internal Medicine

## 2021-12-05 ENCOUNTER — Encounter (HOSPITAL_COMMUNITY): Payer: Self-pay

## 2021-12-05 DIAGNOSIS — F1990 Other psychoactive substance use, unspecified, uncomplicated: Secondary | ICD-10-CM | POA: Diagnosis present

## 2021-12-05 DIAGNOSIS — Z79899 Other long term (current) drug therapy: Secondary | ICD-10-CM | POA: Diagnosis not present

## 2021-12-05 DIAGNOSIS — G40309 Generalized idiopathic epilepsy and epileptic syndromes, not intractable, without status epilepticus: Secondary | ICD-10-CM | POA: Diagnosis present

## 2021-12-05 DIAGNOSIS — Z20822 Contact with and (suspected) exposure to covid-19: Secondary | ICD-10-CM | POA: Diagnosis not present

## 2021-12-05 DIAGNOSIS — R7401 Elevation of levels of liver transaminase levels: Secondary | ICD-10-CM | POA: Diagnosis not present

## 2021-12-05 DIAGNOSIS — E1065 Type 1 diabetes mellitus with hyperglycemia: Secondary | ICD-10-CM | POA: Diagnosis present

## 2021-12-05 DIAGNOSIS — F314 Bipolar disorder, current episode depressed, severe, without psychotic features: Secondary | ICD-10-CM | POA: Diagnosis present

## 2021-12-05 DIAGNOSIS — G40909 Epilepsy, unspecified, not intractable, without status epilepticus: Secondary | ICD-10-CM | POA: Insufficient documentation

## 2021-12-05 DIAGNOSIS — F259 Schizoaffective disorder, unspecified: Secondary | ICD-10-CM | POA: Diagnosis present

## 2021-12-05 DIAGNOSIS — F17210 Nicotine dependence, cigarettes, uncomplicated: Secondary | ICD-10-CM | POA: Insufficient documentation

## 2021-12-05 DIAGNOSIS — R739 Hyperglycemia, unspecified: Secondary | ICD-10-CM

## 2021-12-05 DIAGNOSIS — R7989 Other specified abnormal findings of blood chemistry: Secondary | ICD-10-CM

## 2021-12-05 DIAGNOSIS — Z794 Long term (current) use of insulin: Secondary | ICD-10-CM | POA: Diagnosis not present

## 2021-12-05 DIAGNOSIS — F603 Borderline personality disorder: Secondary | ICD-10-CM | POA: Diagnosis present

## 2021-12-05 DIAGNOSIS — R748 Abnormal levels of other serum enzymes: Secondary | ICD-10-CM | POA: Diagnosis present

## 2021-12-05 DIAGNOSIS — F1510 Other stimulant abuse, uncomplicated: Secondary | ICD-10-CM | POA: Diagnosis present

## 2021-12-05 DIAGNOSIS — Z91199 Patient's noncompliance with other medical treatment and regimen due to unspecified reason: Secondary | ICD-10-CM

## 2021-12-05 DIAGNOSIS — J45909 Unspecified asthma, uncomplicated: Secondary | ICD-10-CM | POA: Insufficient documentation

## 2021-12-05 LAB — CBC WITH DIFFERENTIAL/PLATELET
Abs Immature Granulocytes: 0.14 10*3/uL — ABNORMAL HIGH (ref 0.00–0.07)
Basophils Absolute: 0.1 10*3/uL (ref 0.0–0.1)
Basophils Relative: 1 %
Eosinophils Absolute: 0 10*3/uL (ref 0.0–0.5)
Eosinophils Relative: 0 %
HCT: 43.1 % (ref 39.0–52.0)
Hemoglobin: 14.6 g/dL (ref 13.0–17.0)
Immature Granulocytes: 1 %
Lymphocytes Relative: 19 %
Lymphs Abs: 2 10*3/uL (ref 0.7–4.0)
MCH: 27.5 pg (ref 26.0–34.0)
MCHC: 33.9 g/dL (ref 30.0–36.0)
MCV: 81.2 fL (ref 80.0–100.0)
Monocytes Absolute: 0.9 10*3/uL (ref 0.1–1.0)
Monocytes Relative: 9 %
Neutro Abs: 7.6 10*3/uL (ref 1.7–7.7)
Neutrophils Relative %: 70 %
Platelets: 296 10*3/uL (ref 150–400)
RBC: 5.31 MIL/uL (ref 4.22–5.81)
RDW: 12.9 % (ref 11.5–15.5)
WBC: 10.8 10*3/uL — ABNORMAL HIGH (ref 4.0–10.5)
nRBC: 0 % (ref 0.0–0.2)

## 2021-12-05 LAB — CBG MONITORING, ED: Glucose-Capillary: 503 mg/dL (ref 70–99)

## 2021-12-05 LAB — BLOOD GAS, ARTERIAL
Acid-Base Excess: 1.1 mmol/L (ref 0.0–2.0)
Bicarbonate: 25.1 mmol/L (ref 20.0–28.0)
O2 Saturation: 93.7 %
Patient temperature: 37.4
pCO2 arterial: 38 mmHg (ref 32–48)
pH, Arterial: 7.43 (ref 7.35–7.45)
pO2, Arterial: 65 mmHg — ABNORMAL LOW (ref 83–108)

## 2021-12-05 MED ORDER — SODIUM CHLORIDE 0.9 % IV BOLUS
1000.0000 mL | Freq: Once | INTRAVENOUS | Status: AC
  Administered 2021-12-05: 1000 mL via INTRAVENOUS

## 2021-12-05 MED ORDER — HALOPERIDOL LACTATE 5 MG/ML IJ SOLN
2.0000 mg | Freq: Once | INTRAMUSCULAR | Status: AC
  Administered 2021-12-05: 2 mg via INTRAVENOUS
  Filled 2021-12-05: qty 1

## 2021-12-05 NOTE — ED Triage Notes (Signed)
Pt presents to ED from home with c/o hyperglycemia, chest, and abdominal pain. Pt endorses left side chest pain that radiates to the left shoulder. Pt also endorses generalized abdominal pain with nausea.

## 2021-12-06 ENCOUNTER — Emergency Department (HOSPITAL_COMMUNITY): Payer: Medicaid Other

## 2021-12-06 ENCOUNTER — Encounter (HOSPITAL_COMMUNITY): Payer: Self-pay | Admitting: Internal Medicine

## 2021-12-06 DIAGNOSIS — G40309 Generalized idiopathic epilepsy and epileptic syndromes, not intractable, without status epilepticus: Secondary | ICD-10-CM

## 2021-12-06 DIAGNOSIS — F603 Borderline personality disorder: Secondary | ICD-10-CM

## 2021-12-06 DIAGNOSIS — R748 Abnormal levels of other serum enzymes: Secondary | ICD-10-CM

## 2021-12-06 DIAGNOSIS — F1510 Other stimulant abuse, uncomplicated: Secondary | ICD-10-CM | POA: Diagnosis not present

## 2021-12-06 DIAGNOSIS — Z91199 Patient's noncompliance with other medical treatment and regimen due to unspecified reason: Secondary | ICD-10-CM

## 2021-12-06 DIAGNOSIS — F1990 Other psychoactive substance use, unspecified, uncomplicated: Secondary | ICD-10-CM

## 2021-12-06 DIAGNOSIS — E1065 Type 1 diabetes mellitus with hyperglycemia: Secondary | ICD-10-CM | POA: Diagnosis not present

## 2021-12-06 DIAGNOSIS — F314 Bipolar disorder, current episode depressed, severe, without psychotic features: Secondary | ICD-10-CM

## 2021-12-06 LAB — BASIC METABOLIC PANEL
Anion gap: 8 (ref 5–15)
Anion gap: 6 (ref 5–15)
Anion gap: 7 (ref 5–15)
Anion gap: 7 (ref 5–15)
Anion gap: 9 (ref 5–15)
Anion gap: 12 (ref 5–15)
Anion gap: 13 (ref 5–15)
BUN: 11 mg/dL (ref 6–20)
BUN: 7 mg/dL (ref 6–20)
BUN: 7 mg/dL (ref 6–20)
BUN: 6 mg/dL (ref 6–20)
BUN: 6 mg/dL (ref 6–20)
BUN: 6 mg/dL (ref 6–20)
BUN: 13 mg/dL (ref 6–20)
CO2: 24 mmol/L (ref 22–32)
CO2: 27 mmol/L (ref 22–32)
CO2: 26 mmol/L (ref 22–32)
CO2: 25 mmol/L (ref 22–32)
CO2: 25 mmol/L (ref 22–32)
CO2: 21 mmol/L — ABNORMAL LOW (ref 22–32)
CO2: 20 mmol/L — ABNORMAL LOW (ref 22–32)
Calcium: 8.8 mg/dL — ABNORMAL LOW (ref 8.9–10.3)
Calcium: 8.7 mg/dL — ABNORMAL LOW (ref 8.9–10.3)
Calcium: 8.9 mg/dL (ref 8.9–10.3)
Calcium: 8.2 mg/dL — ABNORMAL LOW (ref 8.9–10.3)
Calcium: 9 mg/dL (ref 8.9–10.3)
Calcium: 9 mg/dL (ref 8.9–10.3)
Calcium: 9.5 mg/dL (ref 8.9–10.3)
Chloride: 104 mmol/L (ref 98–111)
Chloride: 104 mmol/L (ref 98–111)
Chloride: 106 mmol/L (ref 98–111)
Chloride: 107 mmol/L (ref 98–111)
Chloride: 103 mmol/L (ref 98–111)
Chloride: 101 mmol/L (ref 98–111)
Chloride: 99 mmol/L (ref 98–111)
Creatinine, Ser: 0.58 mg/dL — ABNORMAL LOW (ref 0.61–1.24)
Creatinine, Ser: 0.42 mg/dL — ABNORMAL LOW (ref 0.61–1.24)
Creatinine, Ser: 0.44 mg/dL — ABNORMAL LOW (ref 0.61–1.24)
Creatinine, Ser: <0.3 mg/dL — ABNORMAL LOW (ref 0.61–1.24)
Creatinine, Ser: 0.43 mg/dL — ABNORMAL LOW (ref 0.61–1.24)
Creatinine, Ser: 0.5 mg/dL — ABNORMAL LOW (ref 0.61–1.24)
Creatinine, Ser: 0.55 mg/dL — ABNORMAL LOW (ref 0.61–1.24)
GFR, Estimated: >60 mL/min (ref >60)
GFR, Estimated: >60 mL/min (ref >60)
GFR, Estimated: >60 mL/min (ref >60)
GFR, Estimated: >60 mL/min (ref >60)
GFR, Estimated: >60 mL/min (ref >60)
GFR, Estimated: >60 mL/min (ref >60)
Glucose, Bld: 401 mg/dL — ABNORMAL HIGH (ref 70–99)
Glucose, Bld: 160 mg/dL — ABNORMAL HIGH (ref 70–99)
Glucose, Bld: 120 mg/dL — ABNORMAL HIGH (ref 70–99)
Glucose, Bld: 117 mg/dL — ABNORMAL HIGH (ref 70–99)
Glucose, Bld: 125 mg/dL — ABNORMAL HIGH (ref 70–99)
Glucose, Bld: 337 mg/dL — ABNORMAL HIGH (ref 70–99)
Glucose, Bld: 418 mg/dL — ABNORMAL HIGH (ref 70–99)
Potassium: 3.9 mmol/L (ref 3.5–5.1)
Potassium: 3.4 mmol/L — ABNORMAL LOW (ref 3.5–5.1)
Potassium: 3.2 mmol/L — ABNORMAL LOW (ref 3.5–5.1)
Potassium: 3 mmol/L — ABNORMAL LOW (ref 3.5–5.1)
Potassium: 3.6 mmol/L (ref 3.5–5.1)
Potassium: 4.6 mmol/L (ref 3.5–5.1)
Potassium: 4.6 mmol/L (ref 3.5–5.1)
Sodium: 136 mmol/L (ref 135–145)
Sodium: 137 mmol/L (ref 135–145)
Sodium: 139 mmol/L (ref 135–145)
Sodium: 139 mmol/L (ref 135–145)
Sodium: 137 mmol/L (ref 135–145)
Sodium: 134 mmol/L — ABNORMAL LOW (ref 135–145)
Sodium: 132 mmol/L — ABNORMAL LOW (ref 135–145)

## 2021-12-06 LAB — BETA-HYDROXYBUTYRIC ACID: Beta-Hydroxybutyric Acid: 0.69 mmol/L — ABNORMAL HIGH (ref 0.05–0.27)

## 2021-12-06 LAB — COMPREHENSIVE METABOLIC PANEL WITH GFR
ALT: 204 U/L — ABNORMAL HIGH (ref 0–44)
AST: 123 U/L — ABNORMAL HIGH (ref 15–41)
Albumin: 4.2 g/dL (ref 3.5–5.0)
Alkaline Phosphatase: 106 U/L (ref 38–126)
Anion gap: 11 (ref 5–15)
BUN: 11 mg/dL (ref 6–20)
CO2: 25 mmol/L (ref 22–32)
Calcium: 9.8 mg/dL (ref 8.9–10.3)
Chloride: 98 mmol/L (ref 98–111)
Creatinine, Ser: 0.75 mg/dL (ref 0.61–1.24)
GFR, Estimated: >60 mL/min
Glucose, Bld: 499 mg/dL — ABNORMAL HIGH (ref 70–99)
Potassium: 4.1 mmol/L (ref 3.5–5.1)
Sodium: 134 mmol/L — ABNORMAL LOW (ref 135–145)
Total Bilirubin: 0.7 mg/dL (ref 0.3–1.2)
Total Protein: 8.2 g/dL — ABNORMAL HIGH (ref 6.5–8.1)

## 2021-12-06 LAB — URINALYSIS, ROUTINE W REFLEX MICROSCOPIC
Bacteria, UA: NONE SEEN
Bilirubin Urine: NEGATIVE
Glucose, UA: >=500 mg/dL — AB
Hgb urine dipstick: NEGATIVE
Ketones, ur: NEGATIVE mg/dL
Leukocytes,Ua: NEGATIVE
Nitrite: NEGATIVE
Protein, ur: NEGATIVE mg/dL
Specific Gravity, Urine: 1.02 (ref 1.005–1.030)
pH: 7 (ref 5.0–8.0)

## 2021-12-06 LAB — GLUCOSE, CAPILLARY
Glucose-Capillary: 133 mg/dL — ABNORMAL HIGH (ref 70–99)
Glucose-Capillary: 286 mg/dL — ABNORMAL HIGH (ref 70–99)
Glucose-Capillary: 311 mg/dL — ABNORMAL HIGH (ref 70–99)
Glucose-Capillary: 316 mg/dL — ABNORMAL HIGH (ref 70–99)
Glucose-Capillary: 303 mg/dL — ABNORMAL HIGH (ref 70–99)

## 2021-12-06 LAB — RAPID URINE DRUG SCREEN, HOSP PERFORMED
Amphetamines: POSITIVE — AB
Barbiturates: NOT DETECTED
Benzodiazepines: NOT DETECTED
Cocaine: NOT DETECTED
Opiates: NOT DETECTED
Tetrahydrocannabinol: NOT DETECTED

## 2021-12-06 LAB — CBG MONITORING, ED
Glucose-Capillary: 127 mg/dL — ABNORMAL HIGH (ref 70–99)
Glucose-Capillary: 140 mg/dL — ABNORMAL HIGH (ref 70–99)
Glucose-Capillary: 142 mg/dL — ABNORMAL HIGH (ref 70–99)
Glucose-Capillary: 170 mg/dL — ABNORMAL HIGH (ref 70–99)
Glucose-Capillary: 182 mg/dL — ABNORMAL HIGH (ref 70–99)
Glucose-Capillary: 214 mg/dL — ABNORMAL HIGH (ref 70–99)
Glucose-Capillary: 333 mg/dL — ABNORMAL HIGH (ref 70–99)
Glucose-Capillary: 437 mg/dL — ABNORMAL HIGH (ref 70–99)

## 2021-12-06 LAB — SARS CORONAVIRUS 2 BY RT PCR: SARS Coronavirus 2 by RT PCR: NEGATIVE

## 2021-12-06 MED ORDER — DEXTROSE 50 % IV SOLN: 0.0000 mL | INTRAVENOUS | Status: DC | PRN

## 2021-12-06 MED ORDER — INSULIN REGULAR(HUMAN) IN NACL 100-0.9 UT/100ML-% IV SOLN
INTRAVENOUS | Status: DC
  Administered 2021-12-06: 11.5 [IU]/h via INTRAVENOUS
  Filled 2021-12-06: qty 100

## 2021-12-06 MED ORDER — FLUOXETINE HCL 10 MG PO CAPS: 10.0000 mg | ORAL_CAPSULE | Freq: Every day | ORAL | Status: DC

## 2021-12-06 MED ORDER — POTASSIUM CHLORIDE CRYS ER 20 MEQ PO TBCR
40.0000 meq | EXTENDED_RELEASE_TABLET | Freq: Once | ORAL | Status: AC
Start: 2021-12-06 — End: 2021-12-06
  Administered 2021-12-06: 40 meq via ORAL
  Filled 2021-12-06: qty 2

## 2021-12-06 MED ORDER — INSULIN REGULAR(HUMAN) IN NACL 100-0.9 UT/100ML-% IV SOLN: INTRAVENOUS | Status: DC

## 2021-12-06 MED ORDER — DEXTROSE IN LACTATED RINGERS 5 % IV SOLN
INTRAVENOUS | Status: DC
  Administered 2021-12-06: 125 mL/h via INTRAVENOUS

## 2021-12-06 MED ORDER — INSULIN ASPART 100 UNIT/ML IJ SOLN
0.0000 [IU] | Freq: Three times a day (TID) | INTRAMUSCULAR | Status: DC
  Administered 2021-12-06: 5 [IU] via SUBCUTANEOUS
  Administered 2021-12-06: 7 [IU] via SUBCUTANEOUS
  Filled 2021-12-06: qty 0.09

## 2021-12-06 MED ORDER — POTASSIUM CHLORIDE CRYS ER 20 MEQ PO TBCR
40.0000 meq | EXTENDED_RELEASE_TABLET | Freq: Once | ORAL | Status: AC
  Administered 2021-12-06: 40 meq via ORAL
  Filled 2021-12-06: qty 2

## 2021-12-06 MED ORDER — INSULIN ASPART 100 UNIT/ML IJ SOLN
0.0000 [IU] | Freq: Every day | INTRAMUSCULAR | Status: DC
  Filled 2021-12-06: qty 0.05

## 2021-12-06 MED ORDER — LACTATED RINGERS IV SOLN: INTRAVENOUS | Status: DC

## 2021-12-06 MED ORDER — ONDANSETRON HCL 4 MG/2ML IJ SOLN: 4.0000 mg | Freq: Four times a day (QID) | INTRAMUSCULAR | Status: DC | PRN

## 2021-12-06 MED ORDER — INSULIN ASPART 100 UNIT/ML IJ SOLN: 5.0000 [IU] | Freq: Three times a day (TID) | INTRAMUSCULAR | Status: DC

## 2021-12-06 MED ORDER — CARBAMAZEPINE 200 MG PO TABS
200.0000 mg | ORAL_TABLET | Freq: Two times a day (BID) | ORAL | Status: DC
  Filled 2021-12-06: qty 1

## 2021-12-06 MED ORDER — CHLORHEXIDINE GLUCONATE CLOTH 2 % EX PADS
6.0000 | MEDICATED_PAD | Freq: Every day | CUTANEOUS | Status: DC
  Administered 2021-12-06: 6 via TOPICAL

## 2021-12-06 MED ORDER — HEPARIN SODIUM (PORCINE) 5000 UNIT/ML IJ SOLN: 5000.0000 [IU] | Freq: Three times a day (TID) | INTRAMUSCULAR | Status: DC

## 2021-12-06 MED ORDER — HALOPERIDOL 5 MG PO TABS: 5.0000 mg | ORAL_TABLET | Freq: Two times a day (BID) | ORAL | Status: DC

## 2021-12-06 MED ORDER — INSULIN DETEMIR 100 UNIT/ML ~~LOC~~ SOLN
30.0000 [IU] | Freq: Two times a day (BID) | SUBCUTANEOUS | Status: DC
  Administered 2021-12-06: 30 [IU] via SUBCUTANEOUS
  Filled 2021-12-06 (×2): qty 0.3

## 2021-12-06 MED ORDER — CLONIDINE HCL 0.1 MG PO TABS: 0.1000 mg | ORAL_TABLET | ORAL | Status: DC | PRN

## 2021-12-06 MED ORDER — DEXTROSE IN LACTATED RINGERS 5 % IV SOLN: INTRAVENOUS | Status: DC

## 2021-12-06 MED ORDER — LACTATED RINGERS IV BOLUS
20.0000 mL/kg | Freq: Once | INTRAVENOUS | Status: AC
Start: 2021-12-06 — End: 2021-12-06
  Administered 2021-12-06: 1560 mL via INTRAVENOUS

## 2021-12-06 NOTE — TOC Transition Note (Addendum)
Transition of Care [facility]) - CM/SW Discharge Note   Patient Details  Name: [name]. MRN: [id number] Date of Birth: 06/04/2001  [facility] CM/SW Contact:  Vassie Moselle, LCSW Phone Number: 12/06/2021, 10:22 AM   Clinical Narrative:    CSW consulted for SA education/resources. Met with pt and discussed psychosocial stressors. Pt reports that he is currently homeless and has been sleeping on the streets. He reports being banned from the [facility] and other shelters in the area. Pt states that he has been reconnected with the [facility] program for housing through the [facility] however, he has not had contact with his case worker in over a month. CSW encouraged pt to reach out to his case worker vs waiting for them to call him.    Pt reports being interested in long term residential substance use treatment. He states he is unable to go to [facility] due to them being unable to manage his insulin. Pt is agreeable to referrals being made to other rehab facilities and is aware that there will most likely be a waitlist and will have to call post discharge to follow up on bed availability. Pt is agreeable to having outpatient and inpatient substance use resources added to his discharge paperwork. CSW has referred patient to [facility] and [facility] for inpatient substance use treatment- both have been added to patients follow up.  1100: Pt was declined at [facility] due to medical complications with seizure hx and type 1 diabetes.   1400: CSW provided this patient with information on a community fridge in [place] where he may be able to store his insulin until he is able to secure more permanent housing. Pt confirmed he is still receiving ACTT services through [facility] and encouraged pt to follow up with their services for housing and case management needs. CSW reviewed referral for rehabs and for [facility] with pt and encouraged pt to call on a daily basis to check for bed availability.   Final  next level of care: Homeless Shelter Barriers to Discharge: Continued Medical Work up   Patient Goals and CMS Choice Patient states their goals for this hospitalization and ongoing recovery are:: To find housing and rehab   Choice offered to / list presented to : Patient  Discharge Placement                       Discharge Plan and Services                DME Arranged: N/A                    Social Determinants of Health (SDOH) Interventions     Readmission Risk Interventions    11/07/2021    3:11 PM  Readmission Risk Prevention Plan  Medication Review (RN Care Manager) Complete  HRI or [place] Complete  SW Recovery Care/Counseling Consult Complete  Palliative Care Screening Not [place] Not Applicable

## 2021-12-06 NOTE — Assessment & Plan Note (Signed)
Chronic abuser of IV crystal met. No opiates. Prn clonidine for withdrawal symptoms

## 2021-12-06 NOTE — Assessment & Plan Note (Signed)
Chronic. 

## 2021-12-06 NOTE — ED Provider Notes (Signed)
[facility] DEPT Provider Note   CSN: [id number] Arrival date & time: 12/05/21  2303     History  Chief Complaint  Patient presents with   Hyperglycemia    [name] [name]. is a 21 y.o. male.  The history is provided by the patient.  Hyperglycemia Blood sugar level PTA:  High Severity:  Severe Onset quality:  Gradual Duration:  1 day Timing:  Constant Progression:  Worsening Chronicity:  Recurrent Diabetes status:  Controlled with insulin Time since last antidiabetic medication: hours. Context: not change in medication and not insulin pump use   Relieved by:  Nothing Ineffective treatments:  None tried Associated symptoms: abdominal pain and polyuria   Associated symptoms: no chest pain, no confusion, no fever, no shortness of breath, no weakness and no weight change   Associated symptoms comment:  Generalized abdominal pain  Risk factors: hx of DKA   Risk factors comment:  Was just discharged yesterday      Home Medications Prior to Admission medications   Medication Sig Start Date End Date Taking? Authorizing Provider  Accu-Chek Softclix Lancets lancets Use to test blood sugars in the morning and at bed time. 11/13/21   Shelly Coss, MD  blood glucose meter kit and supplies KIT Dispense based on patient and insurance preference. Use up to four times daily as directed. (FOR ICD-9 250.00, 250.01). 11/09/20   Florencia Reasons, MD  Blood Glucose Monitoring Suppl (ACCU-CHEK GUIDE) w/Device KIT Use as directed 11/13/21   Shelly Coss, MD  carbamazepine (TEGRETOL) 200 MG tablet Take 1 tablet (200 mg total) by mouth every 12 (twelve) hours. 12/04/21 02/02/22  Ghimire, Henreitta Leber, MD  EPINEPHrine 0.3 mg/0.3 mL IJ SOAJ injection Inject 0.3 mg into the muscle once as needed for anaphylaxis (severe allergic reaction). 05/02/20   [provider]  FLUoxetine (PROZAC) 10 MG capsule Take 1 capsule (10 mg total) by mouth daily. 12/05/21   Ghimire, Henreitta Leber, MD  glucose blood (ACCU-CHEK GUIDE) test strip Use to test blood sugars in the morning and bed time. 11/13/21   Shelly Coss, MD  haloperidol (HALDOL) 5 MG tablet Take 1 tablet (5 mg total) by mouth 2 (two) times daily. 12/04/21   Ghimire, Henreitta Leber, MD  insulin aspart (NOVOLOG FLEXPEN) 100 UNIT/ML FlexPen Inject 8 Units into the skin 3 (three) times daily with meals. 12/04/21   Ghimire, Henreitta Leber, MD  insulin glargine (LANTUS) 100 UNIT/ML Solostar Pen Inject 35 Units into the skin in the morning and at bedtime. 12/04/21   Ghimire, Henreitta Leber, MD  Insulin Pen Needle 32G X 4 MM MISC Use as directed 12/04/21   Ghimire, Henreitta Leber, MD  Insulin Pen Needle 32G X 4 MM MISC Use to inject insulin 3 times daily as directed. 12/04/21   Ghimire, Henreitta Leber, MD  lisinopril (ZESTRIL) 10 MG tablet Take 1 tablet (10 mg total) by mouth daily. 12/05/21   Ghimire, Henreitta Leber, MD  oxyCODONE (OXY IR/ROXICODONE) 5 MG immediate release tablet Take 1 tablet (5 mg total) by mouth every 6 (six) hours as needed for severe pain. Patient not taking: Reported on 11/30/2021 11/08/21   Oswald Hillock, MD      Allergies    Bee venom, Fish allergy, Shellfish allergy, and Tea    Review of Systems   Review of Systems  Constitutional:  Negative for fever.  HENT:  Negative for facial swelling.   Eyes:  Negative for photophobia.  Respiratory:  Negative for shortness  of breath.   Cardiovascular:  Negative for chest pain.  Gastrointestinal:  Positive for abdominal pain.  Endocrine: Positive for polyuria.  Musculoskeletal:  Negative for arthralgias.  Neurological:  Negative for facial asymmetry and weakness.  Psychiatric/Behavioral:  Negative for confusion.   All other systems reviewed and are negative.   Physical Exam Updated Vital Signs BP (!) 149/98   Pulse 74   Temp 99.1 F (37.3 C) (Oral)   Resp 17   SpO2 98%  Physical Exam Vitals and nursing note reviewed.  Constitutional:      General: He is not in acute distress.     Appearance: Normal appearance.  HENT:     Head: Normocephalic and atraumatic.     Nose: Nose normal.  Eyes:     Conjunctiva/sclera: Conjunctivae normal.     Pupils: Pupils are equal, round, and reactive to light.  Cardiovascular:     Rate and Rhythm: Regular rhythm. Tachycardia present.     Pulses: Normal pulses.     Heart sounds: Normal heart sounds.  Pulmonary:     Effort: Pulmonary effort is normal.     Breath sounds: Normal breath sounds.  Abdominal:     General: Abdomen is flat. Bowel sounds are normal.     Palpations: Abdomen is soft.     Tenderness: There is no abdominal tenderness. There is no guarding.     Hernia: No hernia is present.  Musculoskeletal:        General: Normal range of motion.     Cervical back: Normal range of motion and neck supple.  Skin:    General: Skin is warm and dry.     Capillary Refill: Capillary refill takes less than 2 seconds.  Neurological:     General: No focal deficit present.     Mental Status: He is alert.     Deep Tendon Reflexes: Reflexes normal.  Psychiatric:        Mood and Affect: Mood normal.        Behavior: Behavior normal.     ED Results / Procedures / Treatments   Labs (all labs ordered are listed, but only abnormal results are displayed) Results for orders placed or performed during the hospital encounter of 12/05/21  SARS Coronavirus 2 by RT PCR (hospital order, performed in [facility] hospital lab) *cepheid single result test* Anterior Nasal Swab   Specimen: Anterior Nasal Swab  Result Value Ref Range   SARS Coronavirus 2 by RT PCR NEGATIVE NEGATIVE  CBC with Differential  Result Value Ref Range   WBC 10.8 (H) 4.0 - 10.5 K/uL   RBC 5.31 4.22 - 5.81 MIL/uL   Hemoglobin 14.6 13.0 - 17.0 g/dL   HCT 43.1 39.0 - 52.0 %   MCV 81.2 80.0 - 100.0 fL   MCH 27.5 26.0 - 34.0 pg   MCHC 33.9 30.0 - 36.0 g/dL   RDW 12.9 11.5 - 15.5 %   Platelets 296 150 - 400 K/uL   nRBC 0.0 0.0 - 0.2 %   Neutrophils Relative % 70 %    Neutro Abs 7.6 1.7 - 7.7 K/uL   Lymphocytes Relative 19 %   Lymphs Abs 2.0 0.7 - 4.0 K/uL   Monocytes Relative 9 %   Monocytes Absolute 0.9 0.1 - 1.0 K/uL   Eosinophils Relative 0 %   Eosinophils Absolute 0.0 0.0 - 0.5 K/uL   Basophils Relative 1 %   Basophils Absolute 0.1 0.0 - 0.1 K/uL   Immature Granulocytes 1 %  Abs Immature Granulocytes 0.14 (H) 0.00 - 0.07 K/uL  Comprehensive metabolic panel  Result Value Ref Range   Sodium 134 (L) 135 - 145 mmol/L   Potassium 4.1 3.5 - 5.1 mmol/L   Chloride 98 98 - 111 mmol/L   CO2 25 22 - 32 mmol/L   Glucose, Bld 499 (H) 70 - 99 mg/dL   BUN 11 6 - 20 mg/dL   Creatinine, Ser 0.75 0.61 - 1.24 mg/dL   Calcium 9.8 8.9 - 10.3 mg/dL   Total Protein 8.2 (H) 6.5 - 8.1 g/dL   Albumin 4.2 3.5 - 5.0 g/dL   AST 123 (H) 15 - 41 U/L   ALT 204 (H) 0 - 44 U/L   Alkaline Phosphatase 106 38 - 126 U/L   Total Bilirubin 0.7 0.3 - 1.2 mg/dL   GFR, Estimated >60 >60 mL/min   Anion gap 11 5 - 15  Blood gas, arterial (at [facility])  Result Value Ref Range   pH, Arterial 7.43 7.35 - 7.45   pCO2 arterial 38 32 - 48 mmHg   pO2, Arterial 65 (L) 83 - 108 mmHg   Bicarbonate 25.1 20.0 - 28.0 mmol/L   Acid-Base Excess 1.1 0.0 - 2.0 mmol/L   O2 Saturation 93.7 %   Patient temperature 37.4    Allens test (pass/fail) PASS PASS  Beta-hydroxybutyric acid  Result Value Ref Range   Beta-Hydroxybutyric Acid 0.69 (H) 0.05 - 0.27 mmol/L  Basic metabolic panel  Result Value Ref Range   Sodium 136 135 - 145 mmol/L   Potassium 3.9 3.5 - 5.1 mmol/L   Chloride 104 98 - 111 mmol/L   CO2 24 22 - 32 mmol/L   Glucose, Bld 401 (H) 70 - 99 mg/dL   BUN 11 6 - 20 mg/dL   Creatinine, Ser 0.58 (L) 0.61 - 1.24 mg/dL   Calcium 8.8 (L) 8.9 - 10.3 mg/dL   GFR, Estimated >60 >60 mL/min   Anion gap 8 5 - 15  POC CBG, ED  Result Value Ref Range   Glucose-Capillary 503 (HH) 70 - 99 mg/dL  CBG monitoring, ED  Result Value Ref Range   Glucose-Capillary 437 (H) 70 - 99 mg/dL  CBG  monitoring, ED  Result Value Ref Range   Glucose-Capillary 333 (H) 70 - 99 mg/dL   US Abdomen Limited  Result Date: 12/06/2021 CLINICAL DATA:  Right upper quadrant pain EXAM: ULTRASOUND ABDOMEN LIMITED RIGHT UPPER QUADRANT COMPARISON:  11/11/2021 FINDINGS: Gallbladder: No gallstones or wall thickening visualized. No sonographic Murphy sign noted by sonographer. Common bile duct: Diameter: 3 mm Liver: Mildly increased in echogenicity without focal mass. Portal vein is patent on color Doppler imaging with normal direction of blood flow towards the liver. Other: None. IMPRESSION: Fatty infiltration of the liver. No other focal abnormality is seen. Electronically Signed   By: Inez Catalina M.D.   On: 12/06/2021 01:24   DG Chest Port 1 View  Result Date: 11/30/2021 CLINICAL DATA:  Chest pain. EXAM: PORTABLE CHEST 1 VIEW COMPARISON:  PA Lat 11/11/2021 FINDINGS: The heart size and mediastinal contours are within normal limits. Both lungs are clear. The visualized skeletal structures are unremarkable. There are multiple overlying monitor wires. IMPRESSION: No active disease.  Stable chest. Electronically Signed   By: Telford Nab M.D.   On: 11/30/2021 04:05   DG Abd 1 View  Result Date: 11/18/2021 CLINICAL DATA:  Diabetic ketoacidosis, abdominal pain EXAM: ABDOMEN - 1 VIEW COMPARISON:  None Available. FINDINGS: Nonobstructive bowel  gas pattern. There is diffuse mucosal fold thickening within multiple aerated loops of small bowel within the left upper quadrant suggesting underlying enteritis. Gas is seen within nondilated appendiceal lumen. No free intraperitoneal gas. No organomegaly. No acute bone abnormality. IMPRESSION: Mucosal fold thickening within multiple loops of small bowel within the left upper quadrant in keeping with an underlying enteritis. No obstruction. Electronically Signed   By: Fidela Salisbury M.D.   On: 11/18/2021 00:25   DG Abd Portable 1V  Result Date: 11/12/2021 CLINICAL DATA:  SBO  (small bowel obstruction) right abdomen pain EXAM: PORTABLE ABDOMEN - 1 VIEW COMPARISON:  [facility], 11/11/2021. FINDINGS: The bowel gas pattern is normal. No radio-opaque calculi or other significant radiographic abnormality are seen. IMPRESSION: Nonobstructed bowel-gas pattern. Electronically Signed   By: Michaelle Birks M.D.   On: 11/12/2021 08:12   CT ABDOMEN PELVIS W CONTRAST  Result Date: 11/11/2021 CLINICAL DATA:  Right lower quadrant abdominal pain. EXAM: CT ABDOMEN AND PELVIS WITH CONTRAST TECHNIQUE: Multidetector CT imaging of the abdomen and pelvis was performed using the standard protocol following bolus administration of intravenous contrast. RADIATION DOSE REDUCTION: This exam was performed according to the departmental dose-optimization program which includes automated exposure control, adjustment of the mA and/or kV according to patient size and/or use of iterative reconstruction technique. CONTRAST:  143m OMNIPAQUE IOHEXOL 300 MG/ML  SOLN COMPARISON:  11/04/2021. FINDINGS: Lower chest: No acute abnormality. Hepatobiliary: No focal liver abnormality is seen. The liver is enlarged measuring 24.4 cm in length. No gallstones, gallbladder wall thickening, or biliary dilatation. Pancreas: Unremarkable. No pancreatic ductal dilatation or surrounding inflammatory changes. Spleen: Normal in size without focal abnormality. Adrenals/Urinary Tract: The adrenal glands are within normal limits. No renal calculus or hydronephrosis. A tiny hypodensity is present in the upper pole of the right kidney in the cortex. There is diffuse bladder wall thickening. Stomach/Bowel: The stomach is within normal limits. A few loops of prominent jejunum are noted in the left upper quadrant is noted in the mid left abdomen measuring 3.2 cm in diameter with possible transition point in the mid left abdomen, coronal image 57. No free air or pneumatosis. A normal appendix is present in the right lower quadrant. Vascular/Lymphatic: No  significant vascular findings are present. No enlarged abdominal or pelvic lymph nodes. Reproductive: Prostate is unremarkable. Other: No abdominal wall hernia or abnormality. No abdominopelvic ascites. Musculoskeletal: No acute or significant osseous findings. IMPRESSION: 1. Mildly distended loops of jejunum in the left upper quadrant with questionable transition point in the mid left abdomen, possible early or partial small bowel obstruction. Clinical correlation is recommended. 2. Diffuse bladder wall thickening suggesting infectious or inflammatory cystitis. 3. Vague hypodensity in the cortex of the upper pole the right kidney, possible pyelonephritis given bladder wall thickening. 4. Hepatomegaly. Electronically Signed   By: LBrett FairyM.D.   On: 11/11/2021 04:20   DG Chest 2 View  Result Date: 11/11/2021 CLINICAL DATA:  Chest pain. EXAM: CHEST - 2 VIEW COMPARISON:  Nov 04, 2021 FINDINGS: The heart size and mediastinal contours are within normal limits. Low lung volumes are seen. Both lungs are clear. The visualized skeletal structures are unremarkable. IMPRESSION: No active cardiopulmonary disease. Electronically Signed   By: TVirgina NorfolkM.D.   On: 11/11/2021 01:48      EKG EKG Interpretation  Date/Time:  Thursday December 05 2021 23:22:55 EDT Ventricular Rate:  107 PR Interval:  146 QRS Duration: 76 QT Interval:  307 QTC Calculation: 410 R Axis:  70 Text Interpretation: Sinus tachycardia Probable left atrial enlargement Confirmed by Randal Buba, Katelen Luepke ([id number]) on 12/06/2021 12:23:33 AM  Radiology US Abdomen Limited  Result Date: 12/06/2021 CLINICAL DATA:  Right upper quadrant pain EXAM: ULTRASOUND ABDOMEN LIMITED RIGHT UPPER QUADRANT COMPARISON:  11/11/2021 FINDINGS: Gallbladder: No gallstones or wall thickening visualized. No sonographic Murphy sign noted by sonographer. Common bile duct: Diameter: 3 mm Liver: Mildly increased in echogenicity without focal mass. Portal vein is patent on  color Doppler imaging with normal direction of blood flow towards the liver. Other: None. IMPRESSION: Fatty infiltration of the liver. No other focal abnormality is seen. Electronically Signed   By: Inez Catalina M.D.   On: 12/06/2021 01:24    Procedures Procedures    Medications Ordered in ED Medications  insulin regular, human (MYXREDLIN) 100 units/ 100 mL infusion (7 Units/hr Intravenous Rate/Dose Change 12/06/21 0115)  lactated ringers infusion ( Intravenous New Bag/Given 12/06/21 0052)  dextrose 5 % in lactated ringers infusion (0 mLs Intravenous Hold 12/06/21 0042)  dextrose 50 % solution 0-50 mL (has no administration in time range)  haloperidol lactate (HALDOL) injection 2 mg (2 mg Intravenous Given 12/05/21 2337)  sodium chloride 0.9 % bolus 1,000 mL (0 mLs Intravenous Stopped 12/06/21 0039)  lactated ringers bolus 1,560 mL (1,560 mLs Intravenous New Bag/Given 12/06/21 0052)    ED Course/ Medical Decision Making/ A&P                           Medical Decision Making H/o DKA was discharged yesterday   Problems Addressed: Elevated liver function tests:    Details: fatty liver on [place],  no cholecystitis Hyperglycemia: acute illness or injury    Details: IVF and insulin drip  Amount and/or Complexity of Data Reviewed External Data Reviewed: labs and notes.    Details: previous notes reviewed Labs: ordered.    Details: all labs reviewed: covid is negative,  white count is elevated at 10.8, normal hemoglobin 14.6, normal platelet count 296k.  elevated beta hydroxybutyrate .69. sodium 136, potassium 3.9, BUN 11, creatinine .58, elevated glucose 503. Radiology: ordered. Discussion of management or test interpretation with external provider(s): Dr. Bridgett Larsson   Risk Prescription drug management. Decision regarding hospitalization.  Critical Care Total time providing critical care: 60 minutes (Insulin drip )    Final Clinical Impression(s) / ED Diagnoses Final diagnoses:  Hyperglycemia   Elevated liver function tests   The patient appears reasonably stabilized for admission considering the current resources, flow, and capabilities available in the ED at this time, and I doubt any other [facility] requiring further screening and/or treatment in the ED prior to admission.  Rx / DC Orders ED Discharge Orders     None         Driana Dazey, MD 12/06/21 9444

## 2021-12-06 NOTE — Discharge Summary (Signed)
Physician [facility] Discharge Summary  [name] [name]. ZOX:[id number] DOB: [birth year] DOA: 12/05/2021  PCP: [facility]  Admit date: 12/05/2021 Discharge date: 12/06/2021  Admitted From: home Disposition:  left AMA  Recommendations for Outpatient Follow-up:  Follow up with PCP ASAP   Diet Orders (From admission, onward)     Start     Ordered   12/06/21 0728  Diet Carb Modified Fluid consistency: Thin; Room service appropriate? Yes  Diet effective now       Question Answer Comment  Diet-HS Snack? Nothing   Calorie Level Medium 1600-2000   Fluid consistency: Thin   Room service appropriate? Yes      12/06/21 0727            HPI / hospital course  Principal Problem:   Uncontrolled type 1 diabetes mellitus with hyperglycemia, with long-term current use of insulin (HCC) Active Problems:   IVDU (intravenous drug user)   Methamphetamine abuse (HCC)   Generalized convulsive epilepsy (HCC)   Noncompliance   Borderline personality disorder (HCC)   Bipolar 1 disorder, depressed, severe (HCC)   Abnormal transaminases   Schizoaffective disorder (HCC)  Patient just admitted this morning at 2 AM, please refer to H&P for full details.  In brief, this is a 21 year old male with history of DM1, polysubstance abuse, schizoaffective disorder, borderline personality disorder, depression, chronic noncompliance with multiple admissions and psychiatric evaluations came into the hospital with DKA.  Left the hospital 2 days ago, apparently received all his prescription has all insulins but not clear whether he took any insulin in the past 2 days.  He told me he has been doing methamphetamine, and thinks that he "overdid it".  Psychiatry was consulted, but the patient was not interested in speaking with psychiatry, he was just evaluated a few days ago and the [facility] application was filled out and patient is to follow-up on this referral.  He was just transitioned to subcutaneous  insulin when patient decided to leave AMA. He expresses desire to leave the Hospital immidiately, patient has been warned that this is not Medically advisable at this time, and can result in Medical complications like Death and Disability, patient understands and accepts the risks involved and assumes full responsibilty of this decision.  Sepsis ruled out  Discharge Instructions   Allergies as of 12/06/2021       Reactions   Bee Venom Anaphylaxis   Fish Allergy Anaphylaxis   Shellfish Allergy Anaphylaxis   Tea Anaphylaxis     Med Rec must be completed prior to using this [facility]       Follow-up Information     [facility] Follow up.   Specialty: Addiction Medicine Why: A referral has been made on your behalf. Call to complete intake/screening for rehab services. Will need to call on a daily basis to check bed availability. Contact information: [address] [place] [address] [phone]         [facility] Follow up.   Why: A referral was made on your behalf on 12/04/21. Please call to check bed availability. Contact information: [address] [place] [address] [phone]         [facility] Follow up.   Specialty: Behavioral Health Why: A referral to [facility] was made on your behalf 12/04/21. Please call daily to check bed availability. Contact information: [address] [place] [address] [phone]  Consultations: Psychiatry  Procedures/Studies:  US Abdomen Limited  Result Date: 12/06/2021 CLINICAL DATA:  Right upper quadrant pain EXAM: ULTRASOUND ABDOMEN LIMITED RIGHT UPPER QUADRANT COMPARISON:  11/11/2021 FINDINGS: Gallbladder: No gallstones or wall thickening visualized. No sonographic Murphy sign noted by sonographer. Common bile duct: Diameter: 3 mm Liver: Mildly increased in echogenicity without focal mass.  Portal vein is patent on color Doppler imaging with normal direction of blood flow towards the liver. Other: None. IMPRESSION: Fatty infiltration of the liver. No other focal abnormality is seen. Electronically Signed   By: Alcide Clever M.D.   On: 12/06/2021 01:24   DG Chest Port 1 View  Result Date: 11/30/2021 CLINICAL DATA:  Chest pain. EXAM: PORTABLE CHEST 1 VIEW COMPARISON:  PA Lat 11/11/2021 FINDINGS: The heart size and mediastinal contours are within normal limits. Both lungs are clear. The visualized skeletal structures are unremarkable. There are multiple overlying monitor wires. IMPRESSION: No active disease.  Stable chest. Electronically Signed   By: Almira Bar M.D.   On: 11/30/2021 04:05   DG Abd 1 View  Result Date: 11/18/2021 CLINICAL DATA:  Diabetic ketoacidosis, abdominal pain EXAM: ABDOMEN - 1 VIEW COMPARISON:  None Available. FINDINGS: Nonobstructive bowel gas pattern. There is diffuse mucosal fold thickening within multiple aerated loops of small bowel within the left upper quadrant suggesting underlying enteritis. Gas is seen within nondilated appendiceal lumen. No free intraperitoneal gas. No organomegaly. No acute bone abnormality. IMPRESSION: Mucosal fold thickening within multiple loops of small bowel within the left upper quadrant in keeping with an underlying enteritis. No obstruction. Electronically Signed   By: Helyn Numbers M.D.   On: 11/18/2021 00:25   DG Abd Portable 1V  Result Date: 11/12/2021 CLINICAL DATA:  SBO (small bowel obstruction) right abdomen pain EXAM: PORTABLE ABDOMEN - 1 VIEW COMPARISON:  CT AP, 11/11/2021. FINDINGS: The bowel gas pattern is normal. No radio-opaque calculi or other significant radiographic abnormality are seen. IMPRESSION: Nonobstructed bowel-gas pattern. Electronically Signed   By: Roanna Banning M.D.   On: 11/12/2021 08:12   CT ABDOMEN PELVIS W CONTRAST  Result Date: 11/11/2021 CLINICAL DATA:  Right lower quadrant abdominal pain. EXAM:  CT ABDOMEN AND PELVIS WITH CONTRAST TECHNIQUE: Multidetector CT imaging of the abdomen and pelvis was performed using the standard protocol following bolus administration of intravenous contrast. RADIATION DOSE REDUCTION: This exam was performed according to the departmental dose-optimization program which includes automated exposure control, adjustment of the mA and/or kV according to patient size and/or use of iterative reconstruction technique. CONTRAST:  OMNIPAQUE IOHEXOL 300 MG/ML  SOLN COMPARISON:  11/04/2021. FINDINGS: Lower chest: No acute abnormality. Hepatobiliary: No focal liver abnormality is seen. The liver is enlarged measuring 24.4 cm in length. No gallstones, gallbladder wall thickening, or biliary dilatation. Pancreas: Unremarkable. No pancreatic ductal dilatation or surrounding inflammatory changes. Spleen: Normal in size without focal abnormality. Adrenals/Urinary Tract: The adrenal glands are within normal limits. No renal calculus or hydronephrosis. A tiny hypodensity is present in the upper pole of the right kidney in the cortex. There is diffuse bladder wall thickening. Stomach/Bowel: The stomach is within normal limits. A few loops of prominent jejunum are noted in the left upper quadrant is noted in the mid left abdomen measuring 3.2 cm in diameter with possible transition point in the mid left abdomen, coronal image 57. No free air or pneumatosis. A normal appendix is present in the right lower quadrant. Vascular/Lymphatic: No significant vascular findings are present. No enlarged abdominal or pelvic lymph nodes.  Reproductive: Prostate is unremarkable. Other: No abdominal wall hernia or abnormality. No abdominopelvic ascites. Musculoskeletal: No acute or significant osseous findings. IMPRESSION: 1. Mildly distended loops of jejunum in the left upper quadrant with questionable transition point in the mid left abdomen, possible early or partial small bowel obstruction. Clinical  correlation is recommended. 2. Diffuse bladder wall thickening suggesting infectious or inflammatory cystitis. 3. Vague hypodensity in the cortex of the upper pole the right kidney, possible pyelonephritis given bladder wall thickening. 4. Hepatomegaly. Electronically Signed   By: Thornell Sartorius M.D.   On: 11/11/2021 04:20   DG Chest 2 View  Result Date: 11/11/2021 CLINICAL DATA:  Chest pain. EXAM: CHEST - 2 VIEW COMPARISON:  Nov 04, 2021 FINDINGS: The heart size and mediastinal contours are within normal limits. Low lung volumes are seen. Both lungs are clear. The visualized skeletal structures are unremarkable. IMPRESSION: No active cardiopulmonary disease. Electronically Signed   By: Aram Candela M.D.   On: 11/11/2021 01:48      The results of significant diagnostics from this hospitalization (including imaging, microbiology, ancillary and laboratory) are listed below for reference.     Microbiology: Recent Results (from the past 240 hour(s))  SARS Coronavirus 2 by RT PCR (hospital order, performed in [facility] hospital lab) *cepheid single result test* Anterior Nasal Swab     Status: None   Collection Time: 12/06/21 12:50 AM   Specimen: Anterior Nasal Swab  Result Value Ref Range Status   SARS Coronavirus 2 by RT PCR NEGATIVE NEGATIVE Final    Comment: (NOTE) SARS-CoV-2 target nucleic acids are NOT DETECTED.  The SARS-CoV-2 RNA is generally detectable in upper and lower respiratory specimens during the acute phase of infection. The lowest concentration of SARS-CoV-2 viral copies this assay can detect is 250 copies / mL. A negative result does not preclude SARS-CoV-2 infection and should not be used as the sole basis for treatment or other patient management decisions.  A negative result may occur with improper specimen collection / handling, submission of specimen other than nasopharyngeal swab, presence of viral mutation(s) within the areas targeted by this assay, and  inadequate number of viral copies (<250 copies / mL). A negative result must be combined with clinical observations, patient history, and epidemiological information.  Fact Sheet for Patients:   RoadLapTop.co.za  Fact Sheet for Healthcare Providers: http://kim-miller.com/  This test is not yet approved or  cleared by the [place] FDA and has been authorized for detection and/or diagnosis of SARS-CoV-2 by FDA under an Emergency Use Authorization (EUA).  This EUA will remain in effect (meaning this test can be used) for the duration of the COVID-19 declaration under Section 564(b)(1) of the Act, 21 U.S.C. section 360bbb-3(b)(1), unless the authorization is terminated or revoked sooner.  Performed at [facility], [address], [place], [place] [address]      Labs: Basic Metabolic Panel: Recent Labs  Lab 12/01/21 1452 12/02/21 0541 12/06/21 0419 12/06/21 0420 12/06/21 0746 12/06/21 0832 12/06/21 1005  NA 137   < > 139 137 139 137 134*  K 3.4*   < > 3.2* 3.4* 3.0* 3.6 4.6  CL 105   < > 106 104 107 103 101  CO2 24   < > 26 27 25 25  21*  GLUCOSE 92   < > 120* 160* 117* 125* 337*  BUN 7   < > 7 7 6 6 6   CREATININE 0.63   < > 0.44* 0.42* <0.30* 0.43* 0.50*  CALCIUM 8.2*   < > 8.9 8.7* 8.2* 9.0 9.0  MG 1.8  --   --   --   --   --   --   PHOS 3.0  --   --   --   --   --   --    < > = values in this interval not displayed.   Liver Function Tests: Recent Labs  Lab 11/30/21 0356 12/05/21 2332  AST 98* 123*  ALT 215* 204*  ALKPHOS 136* 106  BILITOT 2.7* 0.7  PROT 8.8* 8.2*  ALBUMIN 4.7 4.2   CBC: Recent Labs  Lab 11/30/21 0249 11/30/21 0415 12/02/21 0025 12/04/21 0507 12/05/21 2332  WBC 9.1  --  5.3 6.1 10.8*  NEUTROABS  --   --   --   --  7.6  HGB 14.7 16.7 13.8 13.6 14.6  HCT 45.0 49.0 40.2 38.9* 43.1  MCV 85.2  --  82.5 80.7 81.2  PLT 337  --  152 219 296   CBG: Recent Labs  Lab  12/06/21 0825 12/06/21 0941 12/06/21 1037 12/06/21 1121 12/06/21 1226  GLUCAP 133* 286* 311* 316* 303*   Hgb A1c No results for input(s): "HGBA1C" in the last 72 hours. Lipid Profile No results for input(s): "CHOL", "HDL", "LDLCALC", "TRIG", "CHOLHDL", "LDLDIRECT" in the last 72 hours. Thyroid function studies No results for input(s): "TSH", "T4TOTAL", "T3FREE", "THYROIDAB" in the last 72 hours.  Invalid input(s): "FREET3" Urinalysis    Component Value Date/Time   COLORURINE STRAW (A) 12/06/2021 0329   APPEARANCEUR CLEAR 12/06/2021 0329   LABSPEC 1.020 12/06/2021 0329   PHURINE 7.0 12/06/2021 0329   GLUCOSEU >=500 (A) 12/06/2021 0329   HGBUR NEGATIVE 12/06/2021 0329   BILIRUBINUR NEGATIVE 12/06/2021 0329   KETONESUR NEGATIVE 12/06/2021 0329   PROTEINUR NEGATIVE 12/06/2021 0329   UROBILINOGEN 0.2 09/18/2019 1339   NITRITE NEGATIVE 12/06/2021 0329   LEUKOCYTESUR NEGATIVE 12/06/2021 0329    FURTHER DISCHARGE INSTRUCTIONS:   Get Medicines reviewed and adjusted: Please take all your medications with you for your next visit with your Primary MD   Laboratory/radiological data: Please request your Primary MD to go over all hospital tests and procedure/radiological results at the follow up, please ask your Primary MD to get all Hospital records sent to his/her office.   In some cases, they will be blood work, cultures and biopsy results pending at the time of your discharge. Please request that your primary care M.D. goes through all the records of your hospital data and follows up on these results.   Also Note the following: If you experience worsening of your admission symptoms, develop shortness of breath, life threatening emergency, suicidal or homicidal thoughts you must seek medical attention immediately by calling 911 or calling your MD immediately  if symptoms less severe.   You must read complete instructions/literature along with all the possible adverse reactions/side  effects for all the Medicines you take and that have been prescribed to you. Take any new Medicines after you have completely understood and accpet all the possible adverse reactions/side effects.    Do not drive when taking Pain medications or sleeping medications (Benzodaizepines)   Do not take more than prescribed Pain, Sleep and Anxiety Medications. It is not advisable to combine anxiety,sleep and pain medications without talking with your primary care practitioner   Special Instructions: If you have smoked or chewed Tobacco  in the last 2 yrs please stop smoking, stop any regular Alcohol  and or any  Recreational drug use.   Wear Seat belts while driving.   Please note: You were cared for by a hospitalist during your hospital stay. Once you are discharged, your primary care physician will handle any further medical issues. Please note that NO REFILLS for any discharge medications will be authorized once you are discharged, as it is imperative that you return to your primary care physician (or establish a relationship with a primary care physician if you do not have one) for your post hospital discharge needs so that they can reassess your need for medications and monitor your lab values.  Time coordinating discharge: 15 minutes  SIGNED:  Pamella Pert, MD, PhD 12/06/2021, 2:50 PM

## 2021-12-06 NOTE — Progress Notes (Signed)
Patient left AMA.

## 2021-12-06 NOTE — Progress Notes (Signed)
No charge note  Patient seen and examined this morning, admitted overnight, H&P reviewed and agree with the assessment and plan.  21 year old male with DM1, polysubstance abuse, schizoaffective disorder, borderline personality disorder, depression, chronic noncompliance, comes in with hyperglycemia.  He left the hospital 2 days ago and tells me that he has been doing drugs with methamphetamine, and thinks he "overdid it".  Reports compliance with insulin but his answers are quite vague.  He tells me he was prescribed insulin when he was discharged and did not get it and currently has insulin  Admitted with DKA overnight, labs much better this morning.  Discontinue insulin drip, placed on subcutaneous insulin and monitor closely.  He currently lives on the streets, reports that he is homeless and [facility] consulted.  It does not look like he has a good way of storing his insulin.  Scheduled Meds:  Chlorhexidine Gluconate Cloth  6 each Topical Daily   heparin  5,000 Units Subcutaneous Q8H   insulin aspart  0-5 Units Subcutaneous QHS   insulin aspart  0-9 Units Subcutaneous TID WC   insulin detemir  30 Units Subcutaneous BID   potassium chloride  40 mEq Oral Once   Continuous Infusions: PRN Meds:.cloNIDine, dextrose, ondansetron (ZOFRAN) IV  Kacelyn Rowzee M. Elvera Lennox, MD, PhD [facility]  Between 7 am - 7 pm you can contact me via Amion (for emergencies) or Securechat (non urgent matters).  I am not available 7 pm - 7 am, please contact night coverage MD/APP via Amion

## 2021-12-06 NOTE — Assessment & Plan Note (Signed)
Long hx of noncompliance with medication regimen, dietary restriction, f/u with PCP, etc.

## 2021-12-06 NOTE — Consult Note (Signed)
Patient not interested in speaking with psych at this time. Psych consult liaison service has met with patient 3x this week, during his other admission at [facility] (4 day). [place] application was filled out, encourage patient to follow up on this referral when possible. Will DC psych consult at this time. The above has been communicated with primary team.

## 2021-12-06 NOTE — Assessment & Plan Note (Signed)
Chronic. Per psych notes from last admission. 

## 2021-12-06 NOTE — H&P (Signed)
History and Physical    [name] [name]. LNL:[id number] DOB: [birth year] DOA: 12/05/2021  DOS: the patient was seen and examined on 12/05/2021  [facility]P: [facility] [place]   Patient coming from:  pt is homeless.  I have personally briefly reviewed patient's old medical records in [place]  CC: abd pain, hyperglycemia HPI: 21 year old homeless Hispanic male with a history of type 1 diabetes since about the age of 75, history of polysubstance abuse including IV crystal meth use, schizoaffective disorder, borderline personality disorder, major depression, chronic noncompliance, history of seizure disorder presents to the ER today with multiple complaints of hyperglycemia, chest pain, abdominal pain.  Patient was recently discharged on 12/04/2021 after a 4-day hospital stay at [facility] due to DKA.  He was seen there by psychiatry as well.  During that visit, it was determined that patient did not qualify for inpatient hospitalization as he continues to use the inpatient setting and a maladaptive coping mechanism to stay in the hospital.  While he was hospitalized, blood sugars were controlled.  He was discharged from the hospital.  He is homeless.  Patient states that he has been taking his insulin but he is evasive about how much he is taking, how he is measuring his blood sugars.  He does admit he used IV crystal meth today.  He admits he used "a lot" today.  He started having abdominal pain and chest pain shortly after using IV crystal meth.  On arrival temp 99.1 heart rate 135 blood pressure 158/107 satting 100% on room air.   Laboratory evaluation:  Beta hydroxybutyrate mildly elevated 0.6  Sodium 134, bicarb 25, BUN of 11, creatinine 0.75, serum glucose of 499  ABG pH is 7.43 PCO2 38 PO2 65  White count 10.8, hemoglobin 14.6, platelets 296  AST of 123, ALT of 204, alk phos of 106, total bili 0.7  [facility] hospital[facility] contacted for admission.      ED Course: serum glucose 401. Abd u/s negative  Review of Systems:  Review of Systems  Constitutional: Negative.   HENT: Negative.    Eyes: Negative.   Respiratory: Negative.    Cardiovascular:  Positive for chest pain.  Gastrointestinal:  Positive for abdominal pain and nausea.  Genitourinary: Negative.   Musculoskeletal: Negative.   Skin: Negative.   Neurological: Negative.   Endo/Heme/Allergies: Negative.   Psychiatric/Behavioral:  Positive for substance abuse.   All other systems reviewed and are negative.   Past Medical History:  Diagnosis Date   ADD (attention deficit disorder)    Allergy    Anxiety    Asthma    Depression    DKA (diabetic ketoacidosis) ([place]) 11/10/2020   Epileptic seizure ([place])    "both petit and grand mal; last sz ~ 2 wk ago" (06/17/2013)   Fracture of multiple ribs 05/11/2020   Headache(784.0)    "2-3 times/wk usually" (06/17/2013)   Heart murmur    "heard a slight one earlier today" (06/17/2013)   Homeless    Methamphetamine intoxication ([place]) 10/26/2018   Migraine    "maybe once/month; it's severe" (06/17/2013)   ODD (oppositional defiant disorder)    Partial small bowel obstruction ([place]) 11/11/2021   SBO (small bowel obstruction) ([place]) 11/11/2021   Sickle cell trait ([place])    Type I diabetes mellitus ([place])    "that's what they're thinking now" (06/17/2013)   Vision abnormalities    "takes him longer to focus cause; from his sz" (06/17/2013)    Past  Surgical History:  Procedure Laterality Date   CIRCUMCISION  2000   FINGER SURGERY Left 2001   "crushed pinky; had to repair it" (06/17/2013)     reports that he has been smoking cigarettes. He has been smoking an average of .5 packs per day. He has never used smokeless tobacco. He reports current drug use. Drug: Methamphetamines. He reports that he does not drink alcohol.  Allergies  Allergen Reactions   Bee Venom Anaphylaxis   Fish Allergy Anaphylaxis   Shellfish Allergy Anaphylaxis    Tea Anaphylaxis    Family History  Problem Relation Age of Onset   Asthma Mother    COPD Mother    Other Mother        possible autoimmune, unclear   Migraines Sister        Hemiplegic Migraines    Diabetes Maternal Grandmother    Heart disease Maternal Grandmother    Hypertension Maternal Grandmother    Mental illness Maternal Grandmother    Heart disease Maternal Grandfather    Hyperlipidemia Paternal Grandmother    Hyperlipidemia Paternal Grandfather     Prior to Admission medications   Medication Sig Start Date End Date Taking? Authorizing Provider  Accu-Chek Softclix Lancets lancets Use to test blood sugars in the morning and at bed time. 11/13/21   Shelly Coss, MD  blood glucose meter kit and supplies KIT Dispense based on patient and insurance preference. Use up to four times daily as directed. (FOR ICD-9 250.00, 250.01). 11/09/20   Florencia Reasons, MD  Blood Glucose Monitoring Suppl (ACCU-CHEK GUIDE) w/Device KIT Use as directed 11/13/21   Shelly Coss, MD  carbamazepine (TEGRETOL) 200 MG tablet Take 1 tablet (200 mg total) by mouth every 12 (twelve) hours. 12/04/21 02/02/22  Ghimire, Henreitta Leber, MD  EPINEPHrine 0.3 mg/0.3 mL IJ SOAJ injection Inject 0.3 mg into the muscle once as needed for anaphylaxis (severe allergic reaction). 05/02/20   [provider]  FLUoxetine (PROZAC) 10 MG capsule Take 1 capsule (10 mg total) by mouth daily. 12/05/21   Ghimire, Henreitta Leber, MD  glucose blood (ACCU-CHEK GUIDE) test strip Use to test blood sugars in the morning and bed time. 11/13/21   Shelly Coss, MD  haloperidol (HALDOL) 5 MG tablet Take 1 tablet (5 mg total) by mouth 2 (two) times daily. 12/04/21   Ghimire, Henreitta Leber, MD  insulin aspart (NOVOLOG FLEXPEN) 100 UNIT/ML FlexPen Inject 8 Units into the skin 3 (three) times daily with meals. 12/04/21   Ghimire, Henreitta Leber, MD  insulin glargine (LANTUS) 100 UNIT/ML Solostar Pen Inject 35 Units into the skin in the morning and at bedtime.  12/04/21   Ghimire, Henreitta Leber, MD  Insulin Pen Needle 32G X 4 MM MISC Use as directed 12/04/21   Ghimire, Henreitta Leber, MD  Insulin Pen Needle 32G X 4 MM MISC Use to inject insulin 3 times daily as directed. 12/04/21   Ghimire, Henreitta Leber, MD  lisinopril (ZESTRIL) 10 MG tablet Take 1 tablet (10 mg total) by mouth daily. 12/05/21   Ghimire, Henreitta Leber, MD  oxyCODONE (OXY IR/ROXICODONE) 5 MG immediate release tablet Take 1 tablet (5 mg total) by mouth every 6 (six) hours as needed for severe pain. Patient not taking: Reported on 11/30/2021 11/08/21   Oswald Hillock, MD    Physical Exam: Vitals:   12/06/21 0000 12/06/21 0030 12/06/21 0100 12/06/21 0130  BP: (!) 146/96 (!) 146/103 (!) 136/93 (!) 149/98  Pulse: (!) 117 100 96 74  Resp: Marland Kitchen)  28 20 (!) 22 17  Temp:      TempSrc:      SpO2: 95% 97% 97% 98%    Physical Exam Vitals and nursing note reviewed.  Constitutional:      General: He is not in acute distress.    Appearance: Normal appearance. He is not ill-appearing, toxic-appearing or diaphoretic.  HENT:     Head: Normocephalic and atraumatic.     Nose: Nose normal.  Cardiovascular:     Rate and Rhythm: Regular rhythm. Tachycardia present.     Pulses: Normal pulses.  Pulmonary:     Effort: Pulmonary effort is normal. No respiratory distress.     Breath sounds: No wheezing or rales.  Abdominal:     General: Bowel sounds are normal. There is distension.     Palpations: Abdomen is soft.     Tenderness: There is abdominal tenderness in the right upper quadrant and suprapubic area.     Hernia: No hernia is present.  Musculoskeletal:     Right lower leg: No edema.     Left lower leg: No edema.  Skin:    General: Skin is warm and dry.     Capillary Refill: Capillary refill takes less than 2 seconds.  Neurological:     Mental Status: He is alert and oriented to person, place, and time.      Labs on Admission: I have personally reviewed following labs and imaging studies  CBC: Recent Labs   Lab 11/30/21 0249 11/30/21 0415 12/02/21 0025 12/04/21 0507 12/05/21 2332  WBC 9.1  --  5.3 6.1 10.8*  NEUTROABS  --   --   --   --  7.6  HGB 14.7 16.7 13.8 13.6 14.6  HCT 45.0 49.0 40.2 38.9* 43.1  MCV 85.2  --  82.5 80.7 81.2  PLT 337  --  152 219 578   Basic Metabolic Panel: Recent Labs  Lab 12/01/21 1452 12/02/21 0541 12/03/21 0031 12/04/21 0507 12/05/21 2332 12/06/21 0046  NA 137 132* 135 138 134* 136  K 3.4* 4.6 4.0 3.3* 4.1 3.9  CL 105 101 100 106 98 104  CO2 _0 GLUCOSE 92 567* 359* 118* 499* 401*  BUN _1 CREATININE 0.63 0.84 0.77 0.48* 0.75 0.58*  CALCIUM 8.2* 8.5* 9.1 8.8* 9.8 8.8*  MG 1.8  --   --   --   --   --   PHOS 3.0  --   --   --   --   --    GFR: Estimated Creatinine Clearance: 154.3 mL/min (A) (by C-G formula based on SCr of 0.58 mg/dL (L)). Liver Function Tests: Recent Labs  Lab 11/30/21 0356 12/05/21 2332  AST 98* 123*  ALT 215* 204*  ALKPHOS 136* 106  BILITOT 2.7* 0.7  PROT 8.8* 8.2*  ALBUMIN 4.7 4.2   Recent Labs  Lab 11/30/21 0356  LIPASE 22   No results for input(s): "AMMONIA" in the last 168 hours. Coagulation Profile: No results for input(s): "INR", "PROTIME" in the last 168 hours. Cardiac Enzymes: Recent Labs  Lab 11/30/21 0249 11/30/21 0451  TROPONINIHS 5 4   BNP (last 3 results) No results for input(s): "PROBNP" in the last 8760 hours. HbA1C: No results for input(s): "HGBA1C" in the last 72 hours. CBG: Recent Labs  Lab 12/04/21 0806 12/04/21 1153 12/05/21 2314 12/06/21 0040 12/06/21 0113  GLUCAP 101* 321* 503* 437* 333*   Lipid Profile: No  results for input(s): "CHOL", "HDL", "LDLCALC", "TRIG", "CHOLHDL", "LDLDIRECT" in the last 72 hours. Thyroid Function Tests: No results for input(s): "TSH", "T4TOTAL", "FREET4", "T3FREE", "THYROIDAB" in the last 72 hours. Anemia Panel: No results for input(s): "VITAMINB12", "FOLATE", "FERRITIN", "TIBC", "IRON", "RETICCTPCT" in the last 72  hours. Urine analysis:    Component Value Date/Time   COLORURINE COLORLESS (A) 11/30/2021 [place] 11/30/2021 0534   LABSPEC 1.016 11/30/2021 0534   PHURINE 5.0 11/30/2021 0534   GLUCOSEU >=500 (A) 11/30/2021 0534   HGBUR NEGATIVE 11/30/2021 0534   BILIRUBINUR NEGATIVE 11/30/2021 0534   KETONESUR 80 (A) 11/30/2021 0534   PROTEINUR NEGATIVE 11/30/2021 0534   UROBILINOGEN 0.2 09/18/2019 1339   NITRITE NEGATIVE 11/30/2021 0534   LEUKOCYTESUR NEGATIVE 11/30/2021 0534    Radiological Exams on Admission: I have personally reviewed images US Abdomen Limited  Result Date: 12/06/2021 CLINICAL DATA:  Right upper quadrant pain EXAM: ULTRASOUND ABDOMEN LIMITED RIGHT UPPER QUADRANT COMPARISON:  11/11/2021 FINDINGS: Gallbladder: No gallstones or wall thickening visualized. No sonographic Murphy sign noted by sonographer. Common bile duct: Diameter: 3 mm Liver: Mildly increased in echogenicity without focal mass. Portal vein is patent on color Doppler imaging with normal direction of blood flow towards the liver. Other: None. IMPRESSION: Fatty infiltration of the liver. No other focal abnormality is seen. Electronically Signed   By: Inez Catalina M.D.   On: 12/06/2021 01:24    EKG: My personal interpretation of EKG shows: sinus tachycardia    Assessment/Plan Principal Problem:   Uncontrolled type 1 diabetes mellitus with hyperglycemia, with long-term current use of insulin (HCC) Active Problems:   IVDU (intravenous drug user)   Methamphetamine abuse ([place])   Generalized convulsive epilepsy ([place])   Noncompliance   Borderline personality disorder ([place])   Bipolar 1 disorder, depressed, severe (HCC)   Abnormal transaminases   Schizoaffective disorder ([place])    Assessment and Plan: * Uncontrolled type 1 diabetes mellitus with hyperglycemia, with long-term current use of insulin ([employer]) Admit to medical telemetry bed. Does not have DKA at this point. HHS protocol started in ER with  IV insulin and IVF. Likely hyperglycemia due to his long history of non-compliance with medication treatment and continued abuse of IV crystal meth.  In reading psych notes, pt has malingering tendencies by eating high carb foods to elevated his CBG in order to stay in the hospital longer. May need to order a vegetarian diet for him.  Uncontrolled type 1 diabetes mellitus with hyperglycemia, with long-term current use of insulin (HCC) is a Acute on Chronic illness/condition that poses a threat to life or bodily function.   Methamphetamine abuse ([place]) Chronic abuser of IV crystal met. No opiates. Prn clonidine for withdrawal symptoms  IVDU (intravenous drug user) Chronic. Admit to using IV crystal meth today. No opiates. Prn clonidine for withdrawal symptoms.  Schizoaffective disorder (HCC) Chronic.   Abnormal transaminases Elevated again. Much higher during last admission. Trended down when he was discharged. Only to spike again today. May be due to recent IV meth use. Last abd U/S in 10-2021 negative for any gallstones. Doubt he has developed gallstones in 1 month.  Bipolar 1 disorder, depressed, severe (HCC) Chronic. Per psych notes from last admission.  Borderline personality disorder ([place]) Chronic. Per psych notes from last admission.  Noncompliance Long hx of noncompliance with medication regimen, dietary restriction, f/u with PCP, etc.  Generalized convulsive epilepsy ([place]) Long history of seizures. According to EMR, pt diagnosed when he was around 21 yo.  Continue with tegretol 200 mg bid.   DVT prophylaxis: SQ Heparin Code Status: Full Code Family Communication: no family at bedside  Disposition Plan: discharge to homeless shelter  Consults called: none  Admission status: Observation, Step Down Unit   Kristopher Oppenheim, DO [facility] 12/06/2021, 1:54 AM

## 2021-12-06 NOTE — Assessment & Plan Note (Addendum)
Admit to medical telemetry bed. Does not have DKA at this point. HHS protocol started in ER with IV insulin and IVF. Likely hyperglycemia due to his long history of non-compliance with medication treatment and continued abuse of IV crystal meth.  In reading psych notes, pt has malingering tendencies by eating high carb foods to elevated his CBG in order to stay in the hospital longer. May need to order a vegetarian diet for him.  Uncontrolled type 1 diabetes mellitus with hyperglycemia, with long-term current use of insulin (HCC) is a Acute on Chronic illness/condition that poses a threat to life or bodily function.

## 2021-12-06 NOTE — Assessment & Plan Note (Signed)
Chronic. Per psych notes from last admission.

## 2021-12-06 NOTE — Assessment & Plan Note (Addendum)
Chronic. Admit to using IV crystal meth today. No opiates. Prn clonidine for withdrawal symptoms.

## 2021-12-06 NOTE — Assessment & Plan Note (Signed)
Elevated again. Much higher during last admission. Trended down when he was discharged. Only to spike again today. May be due to recent IV meth use. Last abd U/S in 10-2021 negative for any gallstones. Doubt he has developed gallstones in 1 month.

## 2021-12-06 NOTE — Assessment & Plan Note (Signed)
>>  ASSESSMENT AND PLAN FOR BIPOLAR 1 DISORDER, DEPRESSED, SEVERE ([place]) WRITTEN ON 12/06/2021  1:14 AM BY CHEN, ERIC, DO  Chronic. Per psych notes from last admission.

## 2021-12-06 NOTE — Subjective & Objective (Signed)
CC: abd pain, hyperglycemia HPI: 21 year old homeless Hispanic male with a history of type 1 diabetes since about the age of 19, history of polysubstance abuse including IV crystal meth use, schizoaffective disorder, borderline personality disorder, major depression, chronic noncompliance, history of seizure disorder presents to the ER today with multiple complaints of hyperglycemia, chest pain, abdominal pain.  Patient was recently discharged on 12/04/2021 after a 4-day hospital stay at [facility] due to DKA.  He was seen there by psychiatry as well.  During that visit, it was determined that patient did not qualify for inpatient hospitalization as he continues to use the inpatient setting and a maladaptive coping mechanism to stay in the hospital.  While he was hospitalized, blood sugars were controlled.  He was discharged from the hospital.  He is homeless.  Patient states that he has been taking his insulin but he is evasive about how much he is taking, how he is measuring his blood sugars.  He does admit he used IV crystal meth today.  He admits he used "a lot" today.  He started having abdominal pain and chest pain shortly after using IV crystal meth.  On arrival temp 99.1 heart rate 135 blood pressure 158/107 satting 100% on room air.   Laboratory evaluation:  Beta hydroxybutyrate mildly elevated 0.6  Sodium 134, bicarb 25, BUN of 11, creatinine 0.75, serum glucose of 499  ABG pH is 7.43 PCO2 38 PO2 65  White count 10.8, hemoglobin 14.6, platelets 296  AST of 123, ALT of 204, alk phos of 106, total bili 0.7  [facility] hospital[facility] contacted for admission.

## 2021-12-06 NOTE — Assessment & Plan Note (Signed)
Long history of seizures. According to EMR, pt diagnosed when he was around 21 yo.  Continue with tegretol 200 mg bid.

## 2021-12-14 ENCOUNTER — Encounter (HOSPITAL_COMMUNITY): Payer: Self-pay | Admitting: Emergency Medicine

## 2021-12-14 ENCOUNTER — Inpatient Hospital Stay (HOSPITAL_COMMUNITY)
Admission: EM | Admit: 2021-12-14 | Discharge: 2021-12-19 | DRG: 638 | Disposition: A | Discharge status: Other Acute Inpatient Hospital | Payer: Medicaid Other | Attending: Student | Admitting: Student

## 2021-12-14 ENCOUNTER — Other Ambulatory Visit: Payer: Self-pay

## 2021-12-14 ENCOUNTER — Ambulatory Visit (HOSPITAL_COMMUNITY)
Admission: RE | Admit: 2021-12-14 | Discharge: 2021-12-14 | Disposition: A | Discharge status: Home / Self Care | Payer: Medicaid Other | Attending: Psychiatry | Admitting: Psychiatry

## 2021-12-14 DIAGNOSIS — E1101 Type 2 diabetes mellitus with hyperosmolarity with coma: Secondary | ICD-10-CM

## 2021-12-14 DIAGNOSIS — F250 Schizoaffective disorder, bipolar type: Secondary | ICD-10-CM | POA: Diagnosis not present

## 2021-12-14 DIAGNOSIS — E1100 Type 2 diabetes mellitus with hyperosmolarity without nonketotic hyperglycemic-hyperosmolar coma (NKHHC): Secondary | ICD-10-CM | POA: Diagnosis present

## 2021-12-14 DIAGNOSIS — Z716 Tobacco abuse counseling: Secondary | ICD-10-CM

## 2021-12-14 DIAGNOSIS — Z794 Long term (current) use of insulin: Secondary | ICD-10-CM

## 2021-12-14 DIAGNOSIS — Z91199 Patient's noncompliance with other medical treatment and regimen due to unspecified reason: Secondary | ICD-10-CM | POA: Diagnosis not present

## 2021-12-14 DIAGNOSIS — F259 Schizoaffective disorder, unspecified: Secondary | ICD-10-CM | POA: Diagnosis present

## 2021-12-14 DIAGNOSIS — I10 Essential (primary) hypertension: Secondary | ICD-10-CM | POA: Diagnosis present

## 2021-12-14 DIAGNOSIS — Z720 Tobacco use: Secondary | ICD-10-CM | POA: Diagnosis not present

## 2021-12-14 DIAGNOSIS — F1990 Other psychoactive substance use, unspecified, uncomplicated: Secondary | ICD-10-CM | POA: Diagnosis present

## 2021-12-14 DIAGNOSIS — K5090 Crohn's disease, unspecified, without complications: Secondary | ICD-10-CM | POA: Diagnosis present

## 2021-12-14 DIAGNOSIS — E876 Hypokalemia: Secondary | ICD-10-CM | POA: Diagnosis present

## 2021-12-14 DIAGNOSIS — F603 Borderline personality disorder: Secondary | ICD-10-CM | POA: Diagnosis present

## 2021-12-14 DIAGNOSIS — J45909 Unspecified asthma, uncomplicated: Secondary | ICD-10-CM | POA: Diagnosis present

## 2021-12-14 DIAGNOSIS — E1065 Type 1 diabetes mellitus with hyperglycemia: Principal | ICD-10-CM | POA: Diagnosis present

## 2021-12-14 DIAGNOSIS — K760 Fatty (change of) liver, not elsewhere classified: Secondary | ICD-10-CM | POA: Diagnosis present

## 2021-12-14 DIAGNOSIS — Z825 Family history of asthma and other chronic lower respiratory diseases: Secondary | ICD-10-CM | POA: Diagnosis not present

## 2021-12-14 DIAGNOSIS — F319 Bipolar disorder, unspecified: Secondary | ICD-10-CM | POA: Diagnosis present

## 2021-12-14 DIAGNOSIS — Z046 Encounter for general psychiatric examination, requested by authority: Secondary | ICD-10-CM

## 2021-12-14 DIAGNOSIS — R748 Abnormal levels of other serum enzymes: Secondary | ICD-10-CM | POA: Diagnosis not present

## 2021-12-14 DIAGNOSIS — Z8249 Family history of ischemic heart disease and other diseases of the circulatory system: Secondary | ICD-10-CM

## 2021-12-14 DIAGNOSIS — F902 Attention-deficit hyperactivity disorder, combined type: Secondary | ICD-10-CM | POA: Diagnosis present

## 2021-12-14 DIAGNOSIS — B182 Chronic viral hepatitis C: Secondary | ICD-10-CM | POA: Diagnosis not present

## 2021-12-14 DIAGNOSIS — Z833 Family history of diabetes mellitus: Secondary | ICD-10-CM | POA: Diagnosis not present

## 2021-12-14 DIAGNOSIS — Z20822 Contact with and (suspected) exposure to covid-19: Secondary | ICD-10-CM | POA: Diagnosis present

## 2021-12-14 DIAGNOSIS — F17210 Nicotine dependence, cigarettes, uncomplicated: Secondary | ICD-10-CM | POA: Diagnosis present

## 2021-12-14 DIAGNOSIS — R45851 Suicidal ideations: Secondary | ICD-10-CM | POA: Diagnosis present

## 2021-12-14 DIAGNOSIS — Z83438 Family history of other disorder of lipoprotein metabolism and other lipidemia: Secondary | ICD-10-CM

## 2021-12-14 DIAGNOSIS — B1710 Acute hepatitis C without hepatic coma: Secondary | ICD-10-CM | POA: Diagnosis present

## 2021-12-14 DIAGNOSIS — F449 Dissociative and conversion disorder, unspecified: Secondary | ICD-10-CM | POA: Diagnosis present

## 2021-12-14 DIAGNOSIS — Z5900 Homelessness unspecified: Secondary | ICD-10-CM | POA: Diagnosis not present

## 2021-12-14 DIAGNOSIS — G40409 Other generalized epilepsy and epileptic syndromes, not intractable, without status epilepticus: Secondary | ICD-10-CM | POA: Diagnosis present

## 2021-12-14 DIAGNOSIS — R739 Hyperglycemia, unspecified: Secondary | ICD-10-CM

## 2021-12-14 LAB — BLOOD GAS, VENOUS
Acid-Base Excess: 1 mmol/L (ref 0.0–2.0)
Bicarbonate: 27.6 mmol/L (ref 20.0–28.0)
O2 Saturation: 52.7 %
Patient temperature: 37
pCO2, Ven: 50 mmHg (ref 44–60)
pH, Ven: 7.35 (ref 7.25–7.43)
pO2, Ven: 31 mmHg — CL (ref 32–45)

## 2021-12-14 LAB — CBC
HCT: 42.3 % (ref 39.0–52.0)
HCT: 39.3 % (ref 39.0–52.0)
Hemoglobin: 14.6 g/dL (ref 13.0–17.0)
Hemoglobin: 13.8 g/dL (ref 13.0–17.0)
MCH: 27.6 pg (ref 26.0–34.0)
MCH: 27.8 pg (ref 26.0–34.0)
MCHC: 34.5 g/dL (ref 30.0–36.0)
MCHC: 35.1 g/dL (ref 30.0–36.0)
MCV: 80 fL (ref 80.0–100.0)
MCV: 79.2 fL — ABNORMAL LOW (ref 80.0–100.0)
Platelets: 277 10*3/uL (ref 150–400)
Platelets: 265 10*3/uL (ref 150–400)
RBC: 5.29 MIL/uL (ref 4.22–5.81)
RBC: 4.96 MIL/uL (ref 4.22–5.81)
RDW: 12.8 % (ref 11.5–15.5)
RDW: 12.8 % (ref 11.5–15.5)
WBC: 6.8 10*3/uL (ref 4.0–10.5)
WBC: 6.2 10*3/uL (ref 4.0–10.5)
nRBC: 0 % (ref 0.0–0.2)
nRBC: 0 % (ref 0.0–0.2)

## 2021-12-14 LAB — COMPREHENSIVE METABOLIC PANEL
ALT: 166 U/L — ABNORMAL HIGH (ref 0–44)
AST: 129 U/L — ABNORMAL HIGH (ref 15–41)
Albumin: 4.1 g/dL (ref 3.5–5.0)
Alkaline Phosphatase: 143 U/L — ABNORMAL HIGH (ref 38–126)
Anion gap: 14 (ref 5–15)
BUN: 13 mg/dL (ref 6–20)
CO2: 22 mmol/L (ref 22–32)
Calcium: 8.7 mg/dL — ABNORMAL LOW (ref 8.9–10.3)
Chloride: 91 mmol/L — ABNORMAL LOW (ref 98–111)
Creatinine, Ser: 0.8 mg/dL (ref 0.61–1.24)
GFR, Estimated: >60 mL/min (ref >60)
Glucose, Bld: 724 mg/dL (ref 70–99)
Potassium: 4.1 mmol/L (ref 3.5–5.1)
Sodium: 127 mmol/L — ABNORMAL LOW (ref 135–145)
Total Bilirubin: 1.1 mg/dL (ref 0.3–1.2)
Total Protein: 7.8 g/dL (ref 6.5–8.1)

## 2021-12-14 LAB — LIPASE, BLOOD: Lipase: 33 U/L (ref 11–51)

## 2021-12-14 LAB — URINALYSIS, ROUTINE W REFLEX MICROSCOPIC
Bacteria, UA: NONE SEEN
Bilirubin Urine: NEGATIVE
Glucose, UA: >=500 mg/dL — AB
Hgb urine dipstick: NEGATIVE
Ketones, ur: 5 mg/dL — AB
Leukocytes,Ua: NEGATIVE
Nitrite: NEGATIVE
Protein, ur: NEGATIVE mg/dL
Specific Gravity, Urine: 1.02 (ref 1.005–1.030)
pH: 8 (ref 5.0–8.0)

## 2021-12-14 LAB — RAPID URINE DRUG SCREEN, HOSP PERFORMED
Amphetamines: NOT DETECTED
Barbiturates: NOT DETECTED
Benzodiazepines: NOT DETECTED
Cocaine: NOT DETECTED
Opiates: NOT DETECTED
Tetrahydrocannabinol: NOT DETECTED

## 2021-12-14 LAB — BETA-HYDROXYBUTYRIC ACID: Beta-Hydroxybutyric Acid: 1.08 mmol/L — ABNORMAL HIGH (ref 0.05–0.27)

## 2021-12-14 LAB — SALICYLATE LEVEL: Salicylate Lvl: <7 mg/dL — ABNORMAL LOW (ref 7.0–30.0)

## 2021-12-14 LAB — BASIC METABOLIC PANEL
Anion gap: 12 (ref 5–15)
BUN: 14 mg/dL (ref 6–20)
CO2: 25 mmol/L (ref 22–32)
Calcium: 9 mg/dL (ref 8.9–10.3)
Chloride: 94 mmol/L — ABNORMAL LOW (ref 98–111)
Creatinine, Ser: 0.56 mg/dL — ABNORMAL LOW (ref 0.61–1.24)
GFR, Estimated: >60 mL/min (ref >60)
Glucose, Bld: 506 mg/dL (ref 70–99)
Potassium: 3.9 mmol/L (ref 3.5–5.1)
Sodium: 131 mmol/L — ABNORMAL LOW (ref 135–145)

## 2021-12-14 LAB — GLUCOSE, CAPILLARY: Glucose-Capillary: 243 mg/dL — ABNORMAL HIGH (ref 70–99)

## 2021-12-14 LAB — ACETAMINOPHEN LEVEL: Acetaminophen (Tylenol), Serum: <10 ug/mL — ABNORMAL LOW (ref 10–30)

## 2021-12-14 LAB — CBG MONITORING, ED
Glucose-Capillary: 472 mg/dL — ABNORMAL HIGH (ref 70–99)
Glucose-Capillary: 340 mg/dL — ABNORMAL HIGH (ref 70–99)

## 2021-12-14 LAB — ETHANOL: Alcohol, Ethyl (B): <10 mg/dL (ref <10)

## 2021-12-14 MED ORDER — LISINOPRIL 10 MG PO TABS
10.0000 mg | ORAL_TABLET | Freq: Every day | ORAL | Status: DC
  Administered 2021-12-15 – 2021-12-19 (×5): 10 mg via ORAL
  Filled 2021-12-14 (×5): qty 1

## 2021-12-14 MED ORDER — POTASSIUM CHLORIDE 10 MEQ/100ML IV SOLN
10.0000 meq | INTRAVENOUS | Status: AC
  Administered 2021-12-14 (×2): 10 meq via INTRAVENOUS
  Filled 2021-12-14 (×2): qty 100

## 2021-12-14 MED ORDER — OXYCODONE HCL 5 MG PO TABS: 5.0000 mg | ORAL_TABLET | Freq: Four times a day (QID) | ORAL | Status: DC | PRN

## 2021-12-14 MED ORDER — INSULIN REGULAR(HUMAN) IN NACL 100-0.9 UT/100ML-% IV SOLN
INTRAVENOUS | Status: DC
  Administered 2021-12-14: 11.5 [IU]/h via INTRAVENOUS
  Filled 2021-12-14: qty 100

## 2021-12-14 MED ORDER — TAMSULOSIN HCL 0.4 MG PO CAPS
0.4000 mg | ORAL_CAPSULE | Freq: Every day | ORAL | Status: DC
  Administered 2021-12-15 – 2021-12-19 (×5): 0.4 mg via ORAL
  Filled 2021-12-14 (×5): qty 1

## 2021-12-14 MED ORDER — SERTRALINE HCL 100 MG PO TABS
100.0000 mg | ORAL_TABLET | Freq: Every day | ORAL | Status: DC
  Administered 2021-12-15 – 2021-12-19 (×5): 100 mg via ORAL
  Filled 2021-12-14 (×5): qty 1

## 2021-12-14 MED ORDER — FLUOXETINE HCL 10 MG PO CAPS
10.0000 mg | ORAL_CAPSULE | Freq: Every day | ORAL | Status: DC
  Administered 2021-12-15 – 2021-12-19 (×5): 10 mg via ORAL
  Filled 2021-12-14 (×5): qty 1

## 2021-12-14 MED ORDER — QUETIAPINE FUMARATE 50 MG PO TABS
75.0000 mg | ORAL_TABLET | Freq: Every day | ORAL | Status: DC
  Administered 2021-12-15 – 2021-12-18 (×5): 75 mg via ORAL
  Filled 2021-12-14 (×2): qty 1
  Filled 2021-12-14: qty 2
  Filled 2021-12-14 (×2): qty 1
  Filled 2021-12-14: qty 2

## 2021-12-14 MED ORDER — LACTATED RINGERS IV SOLN: INTRAVENOUS | Status: DC

## 2021-12-14 MED ORDER — DEXTROSE 50 % IV SOLN: 0.0000 mL | INTRAVENOUS | Status: DC | PRN

## 2021-12-14 MED ORDER — LACTATED RINGERS IV BOLUS
20.0000 mL/kg | Freq: Once | INTRAVENOUS | Status: AC
  Administered 2021-12-14: 1560 mL via INTRAVENOUS

## 2021-12-14 MED ORDER — ENOXAPARIN SODIUM 40 MG/0.4ML IJ SOSY
40.0000 mg | PREFILLED_SYRINGE | INTRAMUSCULAR | Status: DC
  Administered 2021-12-16: 40 mg via SUBCUTANEOUS
  Filled 2021-12-14 (×5): qty 0.4

## 2021-12-14 MED ORDER — DEXTROSE IN LACTATED RINGERS 5 % IV SOLN: INTRAVENOUS | Status: DC

## 2021-12-14 NOTE — ED Notes (Signed)
Pt belongings sent to room with patient

## 2021-12-14 NOTE — H&P (Signed)
History and Physical    Patient: [name] [name]. POE:[id number] DOB: 2000/10/19 DOA: 12/14/2021 DOS: the patient was seen and examined on 12/14/2021 PCP: [facility] [place]  Patient coming from: Home  Chief Complaint:  Chief Complaint  Patient presents with   Suicidal   HPI: [name] Creps. is a 21 y.o. male with medical history significant of type 1 diabetes since the age of 73, ADD, recurrent hospitalizations with DKA, polysubstance abuse including methamphetamine, homelessness, asthma, schizoaffective disorder, borderline personality disorder, bipolar disorder and convulsive epilepsy who has had issues with suicidal ideation in the past.  Patient came in today for medical clearance.  He has been having feelings of hopelessness, worthlessness, increased feelings of suicide with a plan.  He brought a gun apparently and his intention is to shoot himself.  He was recently in the hospital on June 9 with DKA.  He was discharged at the time after DKA was resolved.  Patient apparently lost his mother about a month ago.  His friend also did overdose and committed suicide yesterday.  This has triggered his desire to also commit suicide.  He is very depressed and not communicating well.  Patient is being admitted to the medical service for medical clearance-had blood sugar of more than 700.  His bicarb is low but patient has a normal gap.  He is therefore not in DKA at this point.  Review of Systems: As mentioned in the history of present illness. All other systems reviewed and are negative. Past Medical History:  Diagnosis Date   ADD (attention deficit disorder)    Allergy    Anxiety    Asthma    Depression    DKA (diabetic ketoacidosis) ([place]) 11/10/2020   Epileptic seizure ([place])    "both petit and grand mal; last sz ~ 2 wk ago" (06/17/2013)   Fracture of multiple ribs 05/11/2020   Headache(784.0)    "2-3 times/wk usually" (06/17/2013)   Heart murmur    "heard a slight  one earlier today" (06/17/2013)   Homeless    Methamphetamine intoxication ([place]) 10/26/2018   Migraine    "maybe once/month; it's severe" (06/17/2013)   ODD (oppositional defiant disorder)    Partial small bowel obstruction ([place]) 11/11/2021   SBO (small bowel obstruction) ([place]) 11/11/2021   Sickle cell trait ([place])    Type I diabetes mellitus ([place])    "that's what they're thinking now" (06/17/2013)   Vision abnormalities    "takes him longer to focus cause; from his sz" (06/17/2013)   Past Surgical History:  Procedure Laterality Date   CIRCUMCISION  2000   FINGER SURGERY Left 2001   "crushed pinky; had to repair it" (06/17/2013)   Social History:  reports that he has been smoking cigarettes. He has been smoking an average of .5 packs per day. He has never used smokeless tobacco. He reports current drug use. Drug: Methamphetamines. He reports that he does not drink alcohol.  Allergies  Allergen Reactions   Bee Venom Anaphylaxis   Fish Allergy Anaphylaxis   Shellfish Allergy Anaphylaxis   Tea Anaphylaxis    Family History  Problem Relation Age of Onset   Asthma Mother    COPD Mother    Other Mother        possible autoimmune, unclear   Migraines Sister        Hemiplegic Migraines    Diabetes Maternal Grandmother    Heart disease Maternal Grandmother    Hypertension Maternal Grandmother  Mental illness Maternal Grandmother    Heart disease Maternal Grandfather    Hyperlipidemia Paternal Grandmother    Hyperlipidemia Paternal Grandfather     Prior to Admission medications   Medication Sig Start Date End Date Taking? Authorizing Provider  Cyanocobalamin (VITAMIN B12 PO) Take 1 tablet by mouth daily.   Yes [provider]  EPINEPHrine 0.3 mg/0.3 mL IJ SOAJ injection Inject 0.3 mg into the muscle once as needed for anaphylaxis (severe allergic reaction). 05/02/20  Yes [provider]  haloperidol (HALDOL) 5 MG tablet Take 1 tablet (5 mg total) by mouth 2  (two) times daily. Patient taking differently: Take 5 mg by mouth in the morning and at bedtime. 12/04/21  Yes Ghimire, Henreitta Leber, MD  insulin aspart (NOVOLOG FLEXPEN) 100 UNIT/ML FlexPen Inject 8 Units into the skin 3 (three) times daily with meals. Patient taking differently: Inject 10 Units into the skin 4 (four) times daily. 12/04/21  Yes Ghimire, Henreitta Leber, MD  insulin glargine (LANTUS) 100 UNIT/ML Solostar Pen Inject 35 Units into the skin in the morning and at bedtime. Patient taking differently: Inject 40 Units into the skin in the morning and at bedtime. 12/04/21  Yes Ghimire, Henreitta Leber, MD  lisinopril (ZESTRIL) 5 MG tablet Take 5 mg by mouth daily.   Yes [provider]  ondansetron (ZOFRAN) 4 MG tablet Take 4 mg by mouth every 8 (eight) hours as needed for nausea or vomiting.   Yes [provider]  oxyCODONE (OXY IR/ROXICODONE) 5 MG immediate release tablet Take 1 tablet (5 mg total) by mouth every 6 (six) hours as needed for severe pain. 11/08/21  Yes Oswald Hillock, MD  QUEtiapine (SEROQUEL) 25 MG tablet Take 75 mg by mouth at bedtime.   Yes [provider]  sertraline (ZOLOFT) 100 MG tablet Take 100 mg by mouth daily.   Yes [provider]  tamsulosin (FLOMAX) 0.4 MG CAPS capsule Take 0.4 mg by mouth daily.   Yes [provider]  Accu-Chek Softclix Lancets lancets Use to test blood sugars in the morning and at bed time. 11/13/21   Shelly Coss, MD  blood glucose meter kit and supplies KIT Dispense based on patient and insurance preference. Use up to four times daily as directed. (FOR ICD-9 250.00, 250.01). 11/09/20   Florencia Reasons, MD  Blood Glucose Monitoring Suppl (ACCU-CHEK GUIDE) w/Device KIT Use as directed 11/13/21   Shelly Coss, MD  carbamazepine (TEGRETOL) 200 MG tablet Take 1 tablet (200 mg total) by mouth every 12 (twelve) hours. Patient not taking: Reported on 12/14/2021 12/04/21 02/02/22  Jonetta Osgood, MD  FLUoxetine (PROZAC) 10 MG  capsule Take 1 capsule (10 mg total) by mouth daily. Patient not taking: Reported on 12/14/2021 12/05/21   Jonetta Osgood, MD  glucose blood (ACCU-CHEK GUIDE) test strip Use to test blood sugars in the morning and bed time. 11/13/21   Shelly Coss, MD  Insulin Pen Needle 32G X 4 MM MISC Use as directed 12/04/21   Ghimire, Henreitta Leber, MD  Insulin Pen Needle 32G X 4 MM MISC Use to inject insulin 3 times daily as directed. 12/04/21   Ghimire, Henreitta Leber, MD  lisinopril (ZESTRIL) 10 MG tablet Take 1 tablet (10 mg total) by mouth daily. Patient not taking: Reported on 12/14/2021 12/05/21   Jonetta Osgood, MD    Physical Exam: Vitals:   12/14/21 1429 12/14/21 1544  BP: 127/90 (!) 132/97  Pulse: 96 78  Resp: 16 16  Temp:  98.7 F (37.1 C)  TempSrc:  Oral  SpO2: 100% 99%   Generally: Depressed, appears to be in no distress HEENT: PERRL, EOMI, no pallor or jaundice Neck: Supple, no JVD, no lymphadenopathy Respiratory: Good air entry bilaterally, no wheeze, no rales no crackles Cardiovascular system: Regular rate and rhythm Abdomen: Soft, nontender with positive bowel sounds Extremities: No edema cyanosis or clubbing Skin exam: No rashes or ulcers Psych: Severely depressed, suicidal,  Data Reviewed:  Bicarb is scheduled, sodium 127, potassium 4.0, chloride 91, CO2 22, glucose 724, calcium 8.7, alkaline phos 143 AST 129 ALT 960 Tylenol and salicylates are negative  Assessment and Plan:   #1 hyperosmolar hyperglycemia: In a type I diabetic.  Patient is depressed and probably misses insulin.  We will continue with the protocol IV insulin.  He is not in DKA but very hyperglycemic.  Hyperosmolar also.  Once patient is out of his hyperglycemia we will transition to subcu insulin and then defer patient back to psychiatry.  #2 suicidal ideation: Patient has a plan and active 1.  He has brought the gun already.  He is involuntarily committed.  Psychiatric to follow-up.  Further adjustment in his  medications.  #3 essential hypertension: Patient is supposed to be on lisinopril.  We will continue with that.  #4 multiple psychiatric conditions: Again these to be addressed by psychiatry while in the hospital and will be transferred to behavioral health when medically cleared  #5 increased LFTs: Transaminitis may be related to drug use or alcohol.  Monitor LFTs      Advance Care Planning:   Code Status: Prior full code  Consults: Psychiatry  Family Communication: No family at bedside  Severity of Illness: The appropriate patient status for this patient is INPATIENT. Inpatient status is judged to be reasonable and necessary in order to provide the required intensity of service to ensure the patient's safety. The patient's presenting symptoms, physical exam findings, and initial radiographic and laboratory data in the context of their chronic comorbidities is felt to place them at high risk for further clinical deterioration. Furthermore, it is not anticipated that the patient will be medically stable for discharge from the hospital within 2 midnights of admission.   * I certify that at the point of admission it is my clinical judgment that the patient will require inpatient hospital care spanning beyond 2 midnights from the point of admission due to high intensity of service, high risk for further deterioration and high frequency of surveillance required.*  AuthorBarbette Merino, MD 12/14/2021 6:55 PM  For on call review [email or web address].

## 2021-12-14 NOTE — ED Triage Notes (Signed)
Patient reports SI with plan to shoot himself. He reports that he bought a gun. Requesting inpatient psych placement. States he watched his best friend commit suicide yesterday, has not been taking his medications, and uses meth and heroin.

## 2021-12-14 NOTE — ED Notes (Signed)
Dr. Rush Landmark notified of critical value glucose 724.

## 2021-12-14 NOTE — ED Notes (Signed)
Patient changed into scrubs and wanded by security. Two labeled patient belongings bags secured at triage nurse's station. Sitter at bedside.

## 2021-12-14 NOTE — H&P (Signed)
Behavioral Health Medical Screening Exam  [name] [name] [name] [name]. is an 21 y.o. male with a past psychiatric history of Borderline Personality Disorder, Bipolar Disorder, Generalized Convulsive Epilepsy, ADHD, Polysubstance Abuse Disorder, suicidal ideations, and medical non-compliance who presented to [facility] voluntarily with suicidal ideations. Patient states he hasn't eaten or check blood glucose levels.   Patient presents alert and oriented, withdrawn. Reports increased feelings of hopelessness, worthlessness, and increased feelings of suicide with multiple plans. Patient states he is unable to contract for safety. Per chart review patient presented to [facility] 12/08/21 with Diabetic ketoacidosis (blood sugars in 500s) where he was placed in critical care. Based on patient recent history and current presentation patient is being transferred to [facility] for medical clearance.    Total Time spent with patient: 20 minutes  Psychiatric Specialty Exam: Physical Exam Vitals and nursing note reviewed.  Constitutional:      Appearance: He is normal weight.  HENT:     Head: Normocephalic.     Nose: Nose normal.     Mouth/Throat:     Pharynx: Oropharynx is clear.  Eyes:     Pupils: Pupils are equal, round, and reactive to light.  Cardiovascular:     Rate and Rhythm: Normal rate.     Pulses: Normal pulses.  Pulmonary:     Effort: Pulmonary effort is normal.  Abdominal:     General: Abdomen is flat.  Musculoskeletal:        General: Normal range of motion.     Cervical back: Normal range of motion.  Skin:    General: Skin is warm and dry.  Neurological:     Mental Status: He is alert and oriented to person, place, and time. Mental status is at baseline.  Psychiatric:        Attention and Perception: Attention and perception normal.        Mood and Affect: Mood is depressed. Affect is flat.        Speech: Speech normal.        Behavior: Behavior is cooperative.        Thought Content: Thought  content is not paranoid or delusional. Thought content includes suicidal ideation. Thought content does not include homicidal ideation. Thought content includes suicidal plan. Thought content does not include homicidal plan.        Cognition and Memory: Cognition and memory normal.        Judgment: Judgment is inappropriate.    Review of Systems  Psychiatric/Behavioral:  Positive for dysphoric mood and suicidal ideas.   All other systems reviewed and are negative.  Blood pressure 127/90, pulse 96, resp. rate 16, SpO2 100 %.There is no height or weight on file to calculate BMI. General Appearance: Casual Eye Contact:  Fair Speech:  Slow Volume:  Normal Mood:  Dysphoric and Hopeless Affect:  Blunt and Restricted Thought Process:  Goal Directed Orientation:  Full (Time, Place, and Person) Thought Content:  Rumination Suicidal Thoughts:  Yes.  with intent/plan Homicidal Thoughts:  No Memory:  Immediate;   Fair Recent;   Fair Remote;   Fair Judgement:  Poor Insight:  Present and Shallow Psychomotor Activity:  Normal Concentration: Concentration: Fair and Attention Span: Fair Recall:  [employer]:Fair Language: Fair Akathisia:  NA Handed:  Left AIMS (if indicated):    Assets:  Resilience Sleep:     Musculoskeletal: Strength & Muscle Tone: within normal limits Gait & Station: normal Patient leans: N/A  Blood pressure 127/90, pulse 96, resp. rate  16, SpO2 100 %.  Recommendations: Based on patient presentation and recent medical history with uncontrolled diabetes, patient to be transferred to [facility] for medical clearance. Report called to [name] Olds, MD 1430. EMTALA completed. AC Danika notified to [facility] manager.   [name] Parish, NP 12/14/2021, 2:35 PM

## 2021-12-14 NOTE — ED Provider Notes (Signed)
[facility] DEPT Provider Note   CSN: [id number] Arrival date & time: 12/14/21  1538     History  Chief Complaint  Patient presents with   Suicidal    [name] [name]. is a 21 y.o. male.  The history is provided by the patient and medical records. No language interpreter was used.  Mental Health Problem Presenting symptoms: suicidal thoughts and suicidal threats   Presenting symptoms: no agitation, no hallucinations and no homicidal ideas   Degree of incapacity (severity):  Severe Onset quality:  Gradual Timing:  Constant Progression:  Worsening Chronicity:  Recurrent Context: noncompliance   Treatment compliance:  Untreated Relieved by:  Nothing Worsened by:  Nothing Ineffective treatments:  None tried Associated symptoms: abdominal pain (yesterday and resolved now)   Associated symptoms: no chest pain, no fatigue and no headaches   Risk factors: hx of mental illness        Home Medications Prior to Admission medications   Medication Sig Start Date End Date Taking? Authorizing Provider  Accu-Chek Softclix Lancets lancets Use to test blood sugars in the morning and at bed time. 11/13/21   Shelly Coss, MD  blood glucose meter kit and supplies KIT Dispense based on patient and insurance preference. Use up to four times daily as directed. (FOR ICD-9 250.00, 250.01). 11/09/20   Florencia Reasons, MD  Blood Glucose Monitoring Suppl (ACCU-CHEK GUIDE) w/Device KIT Use as directed 11/13/21   Shelly Coss, MD  carbamazepine (TEGRETOL) 200 MG tablet Take 1 tablet (200 mg total) by mouth every 12 (twelve) hours. 12/04/21 02/02/22  Ghimire, Henreitta Leber, MD  EPINEPHrine 0.3 mg/0.3 mL IJ SOAJ injection Inject 0.3 mg into the muscle once as needed for anaphylaxis (severe allergic reaction). 05/02/20   [provider]  FLUoxetine (PROZAC) 10 MG capsule Take 1 capsule (10 mg total) by mouth daily. 12/05/21   Ghimire, Henreitta Leber, MD  glucose blood (ACCU-CHEK  GUIDE) test strip Use to test blood sugars in the morning and bed time. 11/13/21   Shelly Coss, MD  haloperidol (HALDOL) 5 MG tablet Take 1 tablet (5 mg total) by mouth 2 (two) times daily. 12/04/21   Ghimire, Henreitta Leber, MD  insulin aspart (NOVOLOG FLEXPEN) 100 UNIT/ML FlexPen Inject 8 Units into the skin 3 (three) times daily with meals. 12/04/21   Ghimire, Henreitta Leber, MD  insulin glargine (LANTUS) 100 UNIT/ML Solostar Pen Inject 35 Units into the skin in the morning and at bedtime. 12/04/21   Ghimire, Henreitta Leber, MD  Insulin Pen Needle 32G X 4 MM MISC Use as directed 12/04/21   Ghimire, Henreitta Leber, MD  Insulin Pen Needle 32G X 4 MM MISC Use to inject insulin 3 times daily as directed. 12/04/21   Ghimire, Henreitta Leber, MD  lisinopril (ZESTRIL) 10 MG tablet Take 1 tablet (10 mg total) by mouth daily. 12/05/21   Ghimire, Henreitta Leber, MD  oxyCODONE (OXY IR/ROXICODONE) 5 MG immediate release tablet Take 1 tablet (5 mg total) by mouth every 6 (six) hours as needed for severe pain. Patient not taking: Reported on 11/30/2021 11/08/21   Oswald Hillock, MD      Allergies    Bee venom, Fish allergy, Shellfish allergy, and Tea    Review of Systems   Review of Systems  Constitutional:  Negative for chills, diaphoresis, fatigue and fever.  HENT:  Negative for congestion.   Eyes:  Negative for visual disturbance.  Respiratory:  Negative for cough, chest tightness and shortness of breath.  Cardiovascular:  Negative for chest pain.  Gastrointestinal:  Positive for abdominal pain (yesterday and resolved now). Negative for constipation, diarrhea, nausea and vomiting.  Genitourinary:  Negative for dysuria, flank pain and frequency.  Musculoskeletal:  Negative for back pain, neck pain and neck stiffness.  Skin:  Negative for rash and wound.  Neurological:  Negative for weakness, light-headedness, numbness and headaches.  Psychiatric/Behavioral:  Positive for suicidal ideas. Negative for agitation, confusion, hallucinations and  homicidal ideas.   All other systems reviewed and are negative.   Physical Exam Updated Vital Signs BP (!) 132/97 (BP Location: Right Arm)   Pulse 78   Temp 98.7 F (37.1 C) (Oral)   Resp 16   SpO2 99%  Physical Exam Vitals and nursing note reviewed.  Constitutional:      General: He is not in acute distress.    Appearance: He is well-developed. He is not ill-appearing, toxic-appearing or diaphoretic.  HENT:     Head: Normocephalic and atraumatic.     Nose: No congestion or rhinorrhea.     Mouth/Throat:     Pharynx: No oropharyngeal exudate or posterior oropharyngeal erythema.  Eyes:     Extraocular Movements: Extraocular movements intact.     Conjunctiva/sclera: Conjunctivae normal.     Pupils: Pupils are equal, round, and reactive to light.  Cardiovascular:     Rate and Rhythm: Normal rate and regular rhythm.     Heart sounds: No murmur heard. Pulmonary:     Effort: Pulmonary effort is normal. No respiratory distress.     Breath sounds: Normal breath sounds. No wheezing, rhonchi or rales.  Chest:     Chest wall: No tenderness.  Abdominal:     General: Abdomen is flat. There is no distension.     Palpations: Abdomen is soft.     Tenderness: There is no abdominal tenderness. There is no right CVA tenderness, left CVA tenderness, guarding or rebound.  Musculoskeletal:        General: No swelling or tenderness.     Cervical back: Neck supple.  Skin:    General: Skin is warm and dry.     Capillary Refill: Capillary refill takes less than 2 seconds.     Findings: No erythema.  Neurological:     General: No focal deficit present.     Mental Status: He is alert.     Sensory: No sensory deficit.     Motor: No weakness.  Psychiatric:        Mood and Affect: Mood is depressed.        Behavior: Behavior is withdrawn. Behavior is not combative.        Thought Content: Thought content includes suicidal ideation.     Comments: Plan to kill self with firearm that he purchased  today.     ED Results / Procedures / Treatments   Labs (all labs ordered are listed, but only abnormal results are displayed) Labs Reviewed  COMPREHENSIVE METABOLIC PANEL - Abnormal; Notable for the following components:      Result Value   Sodium 127 (*)    Chloride 91 (*)    Glucose, Bld 724 (*)    Calcium 8.7 (*)    AST 129 (*)    ALT 166 (*)    Alkaline Phosphatase 143 (*)    All other components within normal limits  SALICYLATE LEVEL - Abnormal; Notable for the following components:   Salicylate Lvl <4.0 (*)    All other components within normal limits  ACETAMINOPHEN LEVEL - Abnormal; Notable for the following components:   Acetaminophen (Tylenol), Serum <10 (*)    All other components within normal limits  BLOOD GAS, VENOUS - Abnormal; Notable for the following components:   pO2, Ven 31 (*)    All other components within normal limits  URINALYSIS, ROUTINE W REFLEX MICROSCOPIC - Abnormal; Notable for the following components:   Color, Urine COLORLESS (*)    Glucose, UA >=500 (*)    Ketones, ur 5 (*)    All other components within normal limits  BETA-HYDROXYBUTYRIC ACID - Abnormal; Notable for the following components:   Beta-Hydroxybutyric Acid 1.08 (*)    All other components within normal limits  ETHANOL  CBC  RAPID URINE DRUG SCREEN, HOSP PERFORMED  LIPASE, BLOOD    EKG None  Radiology No results found.  Procedures Procedures    CRITICAL CARE Performed by: Gwenyth Allegra Keylon Labelle Total critical care time: 35 minutes Critical care time was exclusive of separately billable procedures and treating other patients. Critical care was necessary to treat or prevent imminent or life-threatening deterioration. Critical care was time spent personally by me on the following activities: development of treatment plan with patient and/or surrogate as well as nursing, discussions with consultants, evaluation of patient's response to treatment, examination of patient,  obtaining history from patient or surrogate, ordering and performing treatments and interventions, ordering and review of laboratory studies, ordering and review of radiographic studies, pulse oximetry and re-evaluation of patient's condition.   Medications Ordered in ED Medications - No data to display  ED Course/ Medical Decision Making/ A&P                           Medical Decision Making Amount and/or Complexity of Data Reviewed Labs: ordered.  Risk Decision regarding hospitalization.    Derris Acheron Sugg. is a 21 y.o. male with a past medical history significant for ADHD, borderline personality sorter, bipolar disorder, polysubstance abuse, hepatitis C, seizures, type 1 diabetes with previous DKA, depression, asthma, anxiety, Crohn's disease, IV drug use, and previous bowel obstruction who presents with suicidal ideation.  According to patient, he was sent over from behavioral health for medical clearance for psychiatric management.  He says that several months ago his mother committed suicide and he was present when that happened.  He reports that yesterday his best friend of 15 years died from overdose in front of him.  He reports that today he had suicidal thoughts with a plan to kill himself with a gun.  He reports he did purchase a firearm today but decided he would have 1 last time to try and get help.  He reports that he has been admitted in the past for inpatient behavioral health help but says normally they ended up sending him home for outpatient follow-up.  He reports that if he is discharged today or is told to do outpatient therapy he will use the gun and kill himself.  He otherwise denies any physical concerns but reports yesterday he did have some upper abdominal cramping and pain that feels consistent with his had DKA in the past.  Chart review shows that he was admitted recently for hyperglycemia troubles.  He reports he has not been taking his medicines.  On exam, lungs  clear and chest nontender.  Abdomen was nontender and had normal bowel sounds.  Normal mucous membranes that did not appear dry.  No focal neurologic deficits.  Patient is quiet  but answering questions appropriately.  He says he wants help.  He does still report suicidal thoughts with a plan to shoot himself with a gun he just purchased.  Clinically I am concerned that patient is a threat to himself given his plan to commit suicide and action of already purchasing a firearm.  With the loss of both mother and best friend, I think he is high risk for suicide.  Given his recent admission for DKA and his report he had some abdominal cramping similar to previous hyperglycemia, we will get screening labs to make sure he is not needing medical admission.  Anticipate reassessment after lab testing.  I will place him under IVC given the high concern for suicidal action.  He will need TTS consultation after he is medically cleared.   Patient's work-up began to return and he is very hyperglycemic in the 700s.  Anion gap is over 12 at 14.  Suspect early DKA recurrence from last week.  Given the patient's hyperglycemia I do not feel he is medically clear at this time.  We will admit for treatment of hyperglycemia and dehydration and he will need TTS evaluation as well.  We will go ahead and put a TTS consult in but he will be admitted for his hyperglycemia in the setting of suicidal ideation and not taking medications.          Final Clinical Impression(s) / ED Diagnoses Final diagnoses:  Suicidal ideation  Involuntary commitment  Hyperglycemia      Clinical Impression: 1. Suicidal ideation   2. Involuntary commitment   3. Hyperglycemia     Disposition: Admit  This note was prepared with assistance of Dragon voice recognition software. Occasional wrong-word or sound-a-like substitutions may have occurred due to the inherent limitations of voice recognition software.     Alithia Zavaleta,  Gwenyth Allegra, MD 12/14/21 1907

## 2021-12-15 ENCOUNTER — Encounter (HOSPITAL_COMMUNITY): Payer: Self-pay | Admitting: Internal Medicine

## 2021-12-15 ENCOUNTER — Other Ambulatory Visit: Payer: Self-pay

## 2021-12-15 DIAGNOSIS — E1100 Type 2 diabetes mellitus with hyperosmolarity without nonketotic hyperglycemic-hyperosmolar coma (NKHHC): Secondary | ICD-10-CM | POA: Diagnosis not present

## 2021-12-15 DIAGNOSIS — E876 Hypokalemia: Secondary | ICD-10-CM

## 2021-12-15 LAB — GLUCOSE, CAPILLARY
Glucose-Capillary: 154 mg/dL — ABNORMAL HIGH (ref 70–99)
Glucose-Capillary: 157 mg/dL — ABNORMAL HIGH (ref 70–99)
Glucose-Capillary: 160 mg/dL — ABNORMAL HIGH (ref 70–99)
Glucose-Capillary: 164 mg/dL — ABNORMAL HIGH (ref 70–99)
Glucose-Capillary: 175 mg/dL — ABNORMAL HIGH (ref 70–99)
Glucose-Capillary: 211 mg/dL — ABNORMAL HIGH (ref 70–99)
Glucose-Capillary: 264 mg/dL — ABNORMAL HIGH (ref 70–99)
Glucose-Capillary: 299 mg/dL — ABNORMAL HIGH (ref 70–99)
Glucose-Capillary: 495 mg/dL — ABNORMAL HIGH (ref 70–99)

## 2021-12-15 LAB — BASIC METABOLIC PANEL
Anion gap: 8 (ref 5–15)
Anion gap: 8 (ref 5–15)
Anion gap: 8 (ref 5–15)
BUN: 10 mg/dL (ref 6–20)
BUN: 7 mg/dL (ref 6–20)
BUN: 8 mg/dL (ref 6–20)
CO2: 26 mmol/L (ref 22–32)
CO2: 26 mmol/L (ref 22–32)
CO2: 24 mmol/L (ref 22–32)
Calcium: 8.7 mg/dL — ABNORMAL LOW (ref 8.9–10.3)
Calcium: 8.4 mg/dL — ABNORMAL LOW (ref 8.9–10.3)
Calcium: 8.5 mg/dL — ABNORMAL LOW (ref 8.9–10.3)
Chloride: 104 mmol/L (ref 98–111)
Chloride: 104 mmol/L (ref 98–111)
Chloride: 105 mmol/L (ref 98–111)
Creatinine, Ser: 0.43 mg/dL — ABNORMAL LOW (ref 0.61–1.24)
Creatinine, Ser: 0.47 mg/dL — ABNORMAL LOW (ref 0.61–1.24)
Creatinine, Ser: 0.39 mg/dL — ABNORMAL LOW (ref 0.61–1.24)
GFR, Estimated: >60 mL/min (ref >60)
GFR, Estimated: >60 mL/min (ref >60)
GFR, Estimated: >60 mL/min (ref >60)
Glucose, Bld: 207 mg/dL — ABNORMAL HIGH (ref 70–99)
Glucose, Bld: 143 mg/dL — ABNORMAL HIGH (ref 70–99)
Glucose, Bld: 133 mg/dL — ABNORMAL HIGH (ref 70–99)
Potassium: 3.1 mmol/L — ABNORMAL LOW (ref 3.5–5.1)
Potassium: 2.9 mmol/L — ABNORMAL LOW (ref 3.5–5.1)
Potassium: 3 mmol/L — ABNORMAL LOW (ref 3.5–5.1)
Sodium: 138 mmol/L (ref 135–145)
Sodium: 138 mmol/L (ref 135–145)
Sodium: 137 mmol/L (ref 135–145)

## 2021-12-15 LAB — MRSA NEXT GEN BY PCR, NASAL: MRSA by PCR Next Gen: NOT DETECTED

## 2021-12-15 LAB — HEPATIC FUNCTION PANEL
ALT: 130 U/L — ABNORMAL HIGH (ref 0–44)
AST: 111 U/L — ABNORMAL HIGH (ref 15–41)
Albumin: 3.4 g/dL — ABNORMAL LOW (ref 3.5–5.0)
Alkaline Phosphatase: 81 U/L (ref 38–126)
Bilirubin, Direct: <0.1 mg/dL (ref 0.0–0.2)
Total Bilirubin: 0.5 mg/dL (ref 0.3–1.2)
Total Protein: 6.3 g/dL — ABNORMAL LOW (ref 6.5–8.1)

## 2021-12-15 LAB — OSMOLALITY: Osmolality: 287 mOsm/kg (ref 275–295)

## 2021-12-15 LAB — POTASSIUM: Potassium: 4.5 mmol/L (ref 3.5–5.1)

## 2021-12-15 MED ORDER — INSULIN ASPART 100 UNIT/ML IJ SOLN
0.0000 [IU] | Freq: Every day | INTRAMUSCULAR | Status: DC
  Administered 2021-12-15: 3 [IU] via SUBCUTANEOUS

## 2021-12-15 MED ORDER — INSULIN GLARGINE-YFGN 100 UNIT/ML ~~LOC~~ SOLN
40.0000 [IU] | Freq: Two times a day (BID) | SUBCUTANEOUS | Status: DC
  Administered 2021-12-15 – 2021-12-16 (×2): 40 [IU] via SUBCUTANEOUS
  Filled 2021-12-15 (×3): qty 0.4

## 2021-12-15 MED ORDER — INSULIN ASPART 100 UNIT/ML IJ SOLN
10.0000 [IU] | Freq: Three times a day (TID) | INTRAMUSCULAR | Status: DC
  Administered 2021-12-15 – 2021-12-16 (×2): 10 [IU] via SUBCUTANEOUS

## 2021-12-15 MED ORDER — CHLORHEXIDINE GLUCONATE CLOTH 2 % EX PADS
6.0000 | MEDICATED_PAD | Freq: Every day | CUTANEOUS | Status: DC
  Administered 2021-12-15 – 2021-12-16 (×2): 6 via TOPICAL

## 2021-12-15 MED ORDER — POTASSIUM CHLORIDE 10 MEQ/100ML IV SOLN: 10.0000 meq | INTRAVENOUS | Status: DC

## 2021-12-15 MED ORDER — INSULIN ASPART 100 UNIT/ML IJ SOLN
0.0000 [IU] | Freq: Three times a day (TID) | INTRAMUSCULAR | Status: DC
  Administered 2021-12-15: 15 [IU] via SUBCUTANEOUS
  Administered 2021-12-15: 8 [IU] via SUBCUTANEOUS

## 2021-12-15 MED ORDER — INSULIN ASPART 100 UNIT/ML IJ SOLN
0.0000 [IU] | Freq: Three times a day (TID) | INTRAMUSCULAR | Status: DC
  Administered 2021-12-15 (×2): 2 [IU] via SUBCUTANEOUS

## 2021-12-15 MED ORDER — ORAL CARE MOUTH RINSE: 15.0000 mL | OROMUCOSAL | Status: DC | PRN

## 2021-12-15 MED ORDER — INSULIN GLARGINE-YFGN 100 UNIT/ML ~~LOC~~ SOLN
10.0000 [IU] | Freq: Two times a day (BID) | SUBCUTANEOUS | Status: DC
Start: 2021-12-15 — End: 2021-12-15
  Administered 2021-12-15: 10 [IU] via SUBCUTANEOUS
  Filled 2021-12-15 (×2): qty 0.1

## 2021-12-15 MED ORDER — POTASSIUM CHLORIDE CRYS ER 20 MEQ PO TBCR
40.0000 meq | EXTENDED_RELEASE_TABLET | Freq: Once | ORAL | Status: AC
  Administered 2021-12-15: 40 meq via ORAL
  Filled 2021-12-15: qty 2

## 2021-12-15 MED ORDER — POTASSIUM CHLORIDE CRYS ER 20 MEQ PO TBCR
40.0000 meq | EXTENDED_RELEASE_TABLET | Freq: Once | ORAL | Status: AC
Start: 2021-12-15 — End: 2021-12-15
  Administered 2021-12-15: 40 meq via ORAL
  Filled 2021-12-15: qty 2

## 2021-12-15 MED ORDER — INSULIN DETEMIR 100 UNIT/ML ~~LOC~~ SOLN
15.0000 [IU] | SUBCUTANEOUS | Status: AC
  Administered 2021-12-15: 15 [IU] via SUBCUTANEOUS
  Filled 2021-12-15: qty 0.15

## 2021-12-15 MED ORDER — LACTATED RINGERS IV SOLN
INTRAVENOUS | Status: DC
  Filled 2021-12-15 (×2): qty 1000

## 2021-12-15 MED ORDER — HALOPERIDOL 5 MG PO TABS
5.0000 mg | ORAL_TABLET | Freq: Once | ORAL | Status: DC
Start: 2021-12-15 — End: 2021-12-19

## 2021-12-15 MED ORDER — POTASSIUM CHLORIDE CRYS ER 20 MEQ PO TBCR
40.0000 meq | EXTENDED_RELEASE_TABLET | ORAL | Status: DC
Start: 2021-12-15 — End: 2021-12-15

## 2021-12-15 MED ORDER — INSULIN ASPART 100 UNIT/ML IJ SOLN
3.0000 [IU] | Freq: Three times a day (TID) | INTRAMUSCULAR | Status: DC
Start: 2021-12-15 — End: 2021-12-15
  Administered 2021-12-15: 3 [IU] via SUBCUTANEOUS

## 2021-12-15 MED ORDER — INSULIN ASPART 100 UNIT/ML IJ SOLN
5.0000 [IU] | Freq: Three times a day (TID) | INTRAMUSCULAR | Status: DC
  Administered 2021-12-15: 5 [IU] via SUBCUTANEOUS

## 2021-12-15 MED ORDER — INSULIN GLARGINE-YFGN 100 UNIT/ML ~~LOC~~ SOLN
30.0000 [IU] | Freq: Two times a day (BID) | SUBCUTANEOUS | Status: DC
  Filled 2021-12-15: qty 0.3

## 2021-12-15 MED ORDER — SODIUM CHLORIDE 0.9 % IV SOLN: INTRAVENOUS | Status: DC

## 2021-12-15 MED ORDER — INSULIN GLARGINE-YFGN 100 UNIT/ML ~~LOC~~ SOLN
15.0000 [IU] | Freq: Two times a day (BID) | SUBCUTANEOUS | Status: DC
  Filled 2021-12-15: qty 0.15

## 2021-12-15 NOTE — Consult Note (Addendum)
[facility] Face-to-Face Psychiatry Consult   Reason for Consult: Suicidal ideation Referring Physician: Hospitalist Patient Identification: [name] [name]. MRN:  [id number] Principal Diagnosis: Hyperosmolar hyperglycemic state (HHS) (HCC) Diagnosis:  Principal Problem:   Hyperosmolar hyperglycemic state (HHS) (HCC) Active Problems:   Uncontrolled type 1 diabetes mellitus with hyperglycemia, with long-term current use of insulin (HCC)   Attention deficit hyperactivity disorder (ADHD), combined type, moderate   Noncompliance   Hyperglycemia due to type 1 diabetes mellitus (HCC)   Schizoaffective disorder (HCC)   IVDU (intravenous drug user)   Tobacco abuse   Conversion disorder   Hep C w/o coma, chronic (HCC)   Hypokalemia   Total Time spent with patient: 30 minutes  Subjective:   [name] [name]. is a 21 y.o. male patient admitted with uncontrolled hyperglycemia with a blood sugar 724. Initially presented to behavioral health hospital for suicidal ideation with a plan to purchase a gun and kill himself.  Once sent for medical clearance, his blood sugar was 724 and he was admitted to the medical floor for stabilization of his blood sugar HPI: Patient is a 21 year old male with a significant medical history of type 1 diabetes, being on long-term insulin, hepatitis C, asthma, Crohn's disease, bipolar disorder, borderline personality disorder along with generalized convulsive epilepsy, polysubstance use and ADHD  Patient this morning stated that his best friend overdosed and died in front of him on 01/10/2022.  He adds that he was living with his friend, and now does not have a place to live.  He reports that he has been feeling depressed, overwhelmed, has a plan to end his life by buying a gun and killing himself.  Patient states that he cannot contract for safety as he needs help to get his depression better.  Patient also reports that he is homeless, lost his mom in April, does not have a  place to live and is not allowed to the [facility], the rescue mission in [place] and any of the shelters in [name].  Patient adds that was person he was hanging out with and because he was with him, he is no longer allowed to any of the centers.  Patient states that he does not want to go to [facility] rescue mission as he wants to stay in the area.  Patient reports that he is hopeless, feels worthless, is overwhelmed as he does not have a job, a place to live, any help in regards to his diabetes.  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   Migraine    "maybe once/month; it's severe" (06/17/2013)   ODD (oppositional defiant disorder)    Partial small bowel obstruction (HCC) 11/11/2021   SBO (small bowel obstruction) (HCC) 11/11/2021   Sickle cell trait (HCC)    Type I diabetes mellitus (  HCC)    "that's what they're thinking now" (06/17/2013)   Vision abnormalities    "takes him longer to focus cause; from his sz" (06/17/2013)    Past Surgical History:  Procedure Laterality Date   CIRCUMCISION  2000   FINGER SURGERY Left 2001   "crushed pinky; had to repair it" (06/17/2013)   Family History:  Family History  Problem Relation Age of Onset   Asthma Mother    COPD Mother    Other Mother        possible autoimmune, unclear   Migraines Sister        Hemiplegic Migraines    Diabetes  Maternal Grandmother    Heart disease Maternal Grandmother    Hypertension Maternal Grandmother    Mental illness Maternal Grandmother    Heart disease Maternal Grandfather    Hyperlipidemia Paternal Grandmother    Hyperlipidemia Paternal Grandfather    Family Psychiatric  History:  Social History:  Social History   Substance and Sexual Activity  Alcohol Use No   Alcohol/week: 0.0 standard drinks of alcohol     Social History   Substance and Sexual Activity  Drug Use Yes   Types: Methamphetamines   Comment: 10- 14 times a week    Social History   Socioeconomic History   Marital status: Married    Spouse name: Not on file   Number of children: 1   Years of education: Not on file   Highest education level: Not on file  Occupational History   Occupation: Unemployed  Tobacco Use   Smoking status: Every Day    Packs/day: 0.50    Types: Cigarettes    Last attempt to quit: 06/2020    Years since quitting: 1.4   Smokeless tobacco: Never   Tobacco comments:    Mom and dad smoke outside   Vaping Use   Vaping Use: Former  Substance and Sexual Activity   Alcohol use: No    Alcohol/week: 0.0 standard drinks of alcohol   Drug use: Yes    Types: Methamphetamines    Comment: 10- 14 times a week   Sexual activity: Yes  Other Topics Concern   Not on file  Social History Narrative    followed by [facility]   Social Determinants of Health   Financial Resource Strain: Not on file  Food Insecurity: Not on file  Transportation Needs: Not on file  Physical Activity: Not on file  Stress: Not on file  Social Connections: Not on file   Additional Social History:    Allergies:   Allergies  Allergen Reactions   Bee Venom Anaphylaxis   Fish Allergy Anaphylaxis   Shellfish Allergy Anaphylaxis   Tea Anaphylaxis    Labs:  Results for orders placed or performed during the hospital encounter of 12/14/21 (from the past 48 hour(s))  Rapid urine drug screen (hospital performed)      Status: None   Collection Time: 12/14/21  3:50 PM  Result Value Ref Range   Opiates NONE DETECTED NONE DETECTED   Cocaine NONE DETECTED NONE DETECTED   Benzodiazepines NONE DETECTED NONE DETECTED   Amphetamines NONE DETECTED NONE DETECTED   Tetrahydrocannabinol NONE DETECTED NONE DETECTED   Barbiturates NONE DETECTED NONE DETECTED    Comment: (NOTE) DRUG SCREEN FOR MEDICAL PURPOSES ONLY.  IF CONFIRMATION IS NEEDED FOR ANY PURPOSE, NOTIFY LAB WITHIN 5 DAYS.  LOWEST DETECTABLE LIMITS FOR URINE DRUG SCREEN Drug Class  Cutoff (ng/mL) Amphetamine and metabolites    1000 Barbiturate and metabolites    200 Benzodiazepine                 200 Tricyclics and metabolites     300 Opiates and metabolites        300 Cocaine and metabolites        300 THC                            50 Performed at [facility], [address] [place], [place] [address]   Urinalysis, Routine w reflex microscopic     Status: Abnormal   Collection Time: 12/14/21  4:34 PM  Result Value Ref Range   Color, Urine COLORLESS (A) YELLOW   APPearance CLEAR CLEAR   Specific Gravity, Urine 1.020 1.005 - 1.030   pH 8.0 5.0 - 8.0   Glucose, UA >=500 (A) NEGATIVE mg/dL   Hgb urine dipstick NEGATIVE NEGATIVE   Bilirubin Urine NEGATIVE NEGATIVE   Ketones, ur 5 (A) NEGATIVE mg/dL   Protein, ur NEGATIVE NEGATIVE mg/dL   Nitrite NEGATIVE NEGATIVE   Leukocytes,Ua NEGATIVE NEGATIVE   Bacteria, UA NONE SEEN NONE SEEN    Comment: Performed at [facility], [address] [place], [place] [address]  Comprehensive metabolic panel     Status: Abnormal   Collection Time: 12/14/21  4:36 PM  Result Value Ref Range   Sodium 127 (L) 135 - 145 mmol/L   Potassium 4.1 3.5 - 5.1 mmol/L   Chloride 91 (L) 98 - 111 mmol/L   CO2 22 22 - 32 mmol/L   Glucose, Bld 724 (HH) 70 - 99 mg/dL    Comment: Glucose reference range applies only to samples taken after fasting for at least  8 hours. CRITICAL RESULT CALLED TO, READ BACK BY AND VERIFIED WITH: [place], RN @ 1811 ON 12/14/2021 BY LBROOKS, MLT    BUN 13 6 - 20 mg/dL   Creatinine, Ser 4.00 0.61 - 1.24 mg/dL   Calcium 8.7 (L) 8.9 - 10.3 mg/dL   Total Protein 7.8 6.5 - 8.1 g/dL   Albumin 4.1 3.5 - 5.0 g/dL   AST 867 (H) 15 - 41 U/L   ALT 166 (H) 0 - 44 U/L   Alkaline Phosphatase 143 (H) 38 - 126 U/L   Total Bilirubin 1.1 0.3 - 1.2 mg/dL   GFR, Estimated >61 >95 mL/min    Comment: (NOTE) Calculated using the CKD-EPI Creatinine Equation (2021)    Anion gap 14 5 - 15    Comment: Performed at [facility], [address] [place], [place] [address]  cbc     Status: None   Collection Time: 12/14/21  4:36 PM  Result Value Ref Range   WBC 6.8 4.0 - 10.5 K/uL   RBC 5.29 4.22 - 5.81 MIL/uL   Hemoglobin 14.6 13.0 - 17.0 g/dL   HCT 71.2 45.8 - 09.9 %   MCV 80.0 80.0 - 100.0 fL   MCH 27.6 26.0 - 34.0 pg   MCHC 34.5 30.0 - 36.0 g/dL   RDW 83.3 82.5 - 05.3 %   Platelets 277 150 - 400 K/uL   nRBC 0.0 0.0 - 0.2 %    Comment: Performed at [facility], [address], [place], [place] [address]  Beta-hydroxybutyric acid     Status: Abnormal   Collection Time: 12/14/21  4:38 PM  Result  Value Ref Range   Beta-Hydroxybutyric Acid 1.08 (H) 0.05 - 0.27 mmol/L    Comment: Performed at [facility], [address], [place], [place] [address]  Ethanol     Status: None   Collection Time: 12/14/21  4:59 PM  Result Value Ref Range   Alcohol, Ethyl (B) <10 <10 mg/dL    Comment: (NOTE) Lowest detectable limit for serum alcohol is 10 mg/dL.  For medical purposes only. Performed at [facility], [address], [place], [place] [address]   Salicylate level     Status: Abnormal   Collection Time: 12/14/21  4:59 PM  Result Value Ref Range   Salicylate Lvl <7.0 (L) 7.0 - 30.0 mg/dL    Comment: Performed at [facility], [address] [place], [place] [address]  Acetaminophen level     Status: Abnormal   Collection Time: 12/14/21  4:59 PM  Result Value Ref Range   Acetaminophen (Tylenol), Serum <10 (L) 10 - 30 ug/mL    Comment: (NOTE) Therapeutic concentrations vary significantly. A range of 10-30 ug/mL  may be an effective concentration for many patients. However, some  are best treated at concentrations outside of this range. Acetaminophen concentrations >150 ug/mL at 4 hours after ingestion  and >50 ug/mL at 12 hours after ingestion are often associated with  toxic reactions.  Performed at [facility], [address] [place], [place] [address]   Blood gas, venous (at [facility] and [facility], not at [facility])     Status: Abnormal   Collection Time: 12/14/21  5:02 PM  Result Value Ref Range   pH, Ven 7.35 7.25 - 7.43   pCO2, Ven 50 44 - 60 mmHg   pO2, Ven 31 (LL) 32 - 45 mmHg    Comment: CRITICAL RESULT CALLED TO, READ BACK BY AND VERIFIED WITH: CHE RN ON 12/14/21 @ 1711 BY GOLSONM    Bicarbonate 27.6 20.0 - 28.0 mmol/L   Acid-Base Excess 1.0 0.0 - 2.0 mmol/L   O2 Saturation 52.7 %   Patient temperature 37.0     Comment: Performed at [facility], [address] [place], [place] [address]  CBC     Status: Abnormal   Collection Time: 12/14/21  8:26 PM  Result Value Ref Range   WBC 6.2 4.0 - 10.5 K/uL   RBC 4.96 4.22 - 5.81 MIL/uL   Hemoglobin 13.8 13.0 - 17.0 g/dL   HCT 13.0 86.5 - 78.4 %   MCV 79.2 (L) 80.0 - 100.0 fL   MCH 27.8 26.0 - 34.0 pg   MCHC 35.1 30.0 - 36.0 g/dL   RDW 69.6 29.5 - 28.4 %   Platelets 265 150 - 400 K/uL   nRBC 0.0 0.0 - 0.2 %    Comment: Performed at [facility], [address] [place], [place] [address]  Basic metabolic panel     Status: Abnormal   Collection Time: 12/14/21  8:26 PM  Result Value Ref Range   Sodium 131 (L) 135 - 145 mmol/L   Potassium 3.9 3.5 - 5.1 mmol/L   Chloride 94 (L) 98 - 111 mmol/L   CO2 25 22 -  32 mmol/L   Glucose, Bld 506 (HH) 70 - 99 mg/dL    Comment: Glucose reference range applies only to samples taken after fasting for at least 8 hours. CRITICAL RESULT CALLED TO, READ BACK BY AND VERIFIED WITH: JULIE, RN @ 2122 ON 12/14/2021 BY Sharlyne Cai,  MLT    BUN 14 6 - 20 mg/dL   Creatinine, Ser 0.98 (L) 0.61 - 1.24 mg/dL   Calcium 9.0 8.9 - 11.9 mg/dL   GFR, Estimated >14 >78 mL/min    Comment: (NOTE) Calculated using the CKD-EPI Creatinine Equation (2021)    Anion gap 12 5 - 15    Comment: Performed at [facility], [address], [place], [place] [address]  Lipase, blood     Status: None   Collection Time: 12/14/21  8:26 PM  Result Value Ref Range   Lipase 33 11 - 51 U/L    Comment: Performed at [facility], [address], [place], [place] [address]  CBG monitoring, ED     Status: Abnormal   Collection Time: 12/14/21  8:59 PM  Result Value Ref Range   Glucose-Capillary 472 (H) 70 - 99 mg/dL    Comment: Glucose reference range applies only to samples taken after fasting for at least 8 hours.  CBG monitoring, ED     Status: Abnormal   Collection Time: 12/14/21 10:00 PM  Result Value Ref Range   Glucose-Capillary 340 (H) 70 - 99 mg/dL    Comment: Glucose reference range applies only to samples taken after fasting for at least 8 hours.  Glucose, capillary     Status: Abnormal   Collection Time: 12/14/21 11:17 PM  Result Value Ref Range   Glucose-Capillary 243 (H) 70 - 99 mg/dL    Comment: Glucose reference range applies only to samples taken after fasting for at least 8 hours.   Comment 1 Notify RN   MRSA Next Gen by PCR, Nasal     Status: None   Collection Time: 12/14/21 11:21 PM   Specimen: Nasal Mucosa; Nasal Swab  Result Value Ref Range   MRSA by PCR Next Gen NOT DETECTED NOT DETECTED    Comment: (NOTE) The GeneXpert MRSA Assay (FDA approved for NASAL specimens only), is one component of a comprehensive MRSA colonization  surveillance program. It is not intended to diagnose MRSA infection nor to guide or monitor treatment for MRSA infections. Test performance is not FDA approved in patients less than 59 years old. Performed at [facility], [address], [place], [place] [address]   Basic metabolic panel     Status: Abnormal   Collection Time: 12/15/21 12:10 AM  Result Value Ref Range   Sodium 138 135 - 145 mmol/L   Potassium 3.1 (L) 3.5 - 5.1 mmol/L   Chloride 104 98 - 111 mmol/L   CO2 26 22 - 32 mmol/L   Glucose, Bld 207 (H) 70 - 99 mg/dL    Comment: Glucose reference range applies only to samples taken after fasting for at least 8 hours.   BUN 10 6 - 20 mg/dL   Creatinine, Ser 9.62 (L) 0.61 - 1.24 mg/dL   Calcium 8.7 (L) 8.9 - 10.3 mg/dL   GFR, Estimated >95 >28 mL/min    Comment: (NOTE) Calculated using the CKD-EPI Creatinine Equation (2021)    Anion gap 8 5 - 15    Comment: Performed at [facility], [address], [place], [place] [address]  Osmolality     Status: None   Collection Time: 12/15/21 12:10 AM  Result Value Ref Range   Osmolality 287 275 - 295 mOsm/kg    Comment: Performed at [facility], [address], [place], [place] [address]  Glucose, capillary     Status: Abnormal  Collection Time: 12/15/21 12:25 AM  Result Value Ref Range   Glucose-Capillary 211 (H) 70 - 99 mg/dL    Comment: Glucose reference range applies only to samples taken after fasting for at least 8 hours.  Glucose, capillary     Status: Abnormal   Collection Time: 12/15/21  1:33 AM  Result Value Ref Range   Glucose-Capillary 164 (H) 70 - 99 mg/dL    Comment: Glucose reference range applies only to samples taken after fasting for at least 8 hours.  Glucose, capillary     Status: Abnormal   Collection Time: 12/15/21  2:45 AM  Result Value Ref Range   Glucose-Capillary 160 (H) 70 - 99 mg/dL    Comment: Glucose reference range applies only to samples taken after  fasting for at least 8 hours.  Glucose, capillary     Status: Abnormal   Collection Time: 12/15/21  3:37 AM  Result Value Ref Range   Glucose-Capillary 157 (H) 70 - 99 mg/dL    Comment: Glucose reference range applies only to samples taken after fasting for at least 8 hours.  Basic metabolic panel     Status: Abnormal   Collection Time: 12/15/21  4:55 AM  Result Value Ref Range   Sodium 138 135 - 145 mmol/L   Potassium 2.9 (L) 3.5 - 5.1 mmol/L   Chloride 104 98 - 111 mmol/L   CO2 26 22 - 32 mmol/L   Glucose, Bld 143 (H) 70 - 99 mg/dL    Comment: Glucose reference range applies only to samples taken after fasting for at least 8 hours.   BUN 7 6 - 20 mg/dL   Creatinine, Ser 1.61 (L) 0.61 - 1.24 mg/dL   Calcium 8.4 (L) 8.9 - 10.3 mg/dL   GFR, Estimated >09 >60 mL/min    Comment: (NOTE) Calculated using the CKD-EPI Creatinine Equation (2021)    Anion gap 8 5 - 15    Comment: Performed at [facility], [address], [place], [place] [address]  Glucose, capillary     Status: Abnormal   Collection Time: 12/15/21  5:49 AM  Result Value Ref Range   Glucose-Capillary 154 (H) 70 - 99 mg/dL    Comment: Glucose reference range applies only to samples taken after fasting for at least 8 hours.  Glucose, capillary     Status: Abnormal   Collection Time: 12/15/21  7:59 AM  Result Value Ref Range   Glucose-Capillary 175 (H) 70 - 99 mg/dL    Comment: Glucose reference range applies only to samples taken after fasting for at least 8 hours.   Comment 1 Notify RN    Comment 2 Document in Chart   Hepatic function panel     Status: Abnormal   Collection Time: 12/15/21  8:18 AM  Result Value Ref Range   Total Protein 6.3 (L) 6.5 - 8.1 g/dL   Albumin 3.4 (L) 3.5 - 5.0 g/dL   AST 811 (H) 15 - 41 U/L   ALT 130 (H) 0 - 44 U/L   Alkaline Phosphatase 81 38 - 126 U/L   Total Bilirubin 0.5 0.3 - 1.2 mg/dL   Bilirubin, Direct <9.1 0.0 - 0.2 mg/dL   Indirect Bilirubin NOT CALCULATED  0.3 - 0.9 mg/dL    Comment: Performed at [facility], [address] [place], [place] [address]  Glucose, capillary     Status: Abnormal   Collection Time: 12/15/21 11:14 AM  Result Value Ref Range   Glucose-Capillary  299 (H) 70 - 99 mg/dL    Comment: Glucose reference range applies only to samples taken after fasting for at least 8 hours.   Comment 1 Notify RN    Comment 2 Document in Chart     Current Facility-Administered Medications  Medication Dose Route Frequency Provider Last Rate Last Admin   Chlorhexidine Gluconate Cloth 2 % PADS 6 each  6 each Topical Q2200 [name], MD   6 each at 12/15/21 0557   dextrose 50 % solution 0-50 mL  0-50 mL Intravenous PRN [name], MD       enoxaparin (LOVENOX) injection 40 mg  40 mg Subcutaneous Q24H Earlie Lou L, MD       FLUoxetine (PROZAC) capsule 10 mg  10 mg Oral Daily [name], MD   10 mg at 12/15/21 0959   haloperidol (HALDOL) tablet 5 mg  5 mg Oral Once Jimmye Norman, NP       insulin aspart (novoLOG) injection 0-15 Units  0-15 Units Subcutaneous TID WC Kc, Ramesh, MD       insulin aspart (novoLOG) injection 0-5 Units  0-5 Units Subcutaneous QHS Ouma, Hubbard Hartshorn, NP       insulin aspart (novoLOG) injection 5 Units  5 Units Subcutaneous TID WC Kc, Ramesh, MD       insulin glargine-yfgn (SEMGLEE) injection 15 Units  15 Units Subcutaneous BID Kc, Ramesh, MD       lisinopril (ZESTRIL) tablet 10 mg  10 mg Oral Daily Earlie Lou L, MD   10 mg at 12/15/21 0959   Oral care mouth rinse  15 mL Mouth Rinse PRN [name], MD       oxyCODONE (Oxy IR/ROXICODONE) immediate release tablet 5 mg  5 mg Oral Q6H PRN Earlie Lou L, MD       potassium chloride SA (KLOR-CON M) CR tablet 40 mEq  40 mEq Oral Once Kc, Dayna Barker, MD       QUEtiapine (SEROQUEL) tablet 75 mg  75 mg Oral QHS Earlie Lou L, MD   75 mg at 12/15/21 0031   sertraline (ZOLOFT) tablet 100 mg  100 mg Oral  Daily Earlie Lou L, MD   100 mg at 12/15/21 0959   tamsulosin (FLOMAX) capsule 0.4 mg  0.4 mg Oral Daily [name], MD   0.4 mg at 12/15/21 7824    Musculoskeletal: Strength & Muscle Tone: within normal limits Gait & Station:  not assessed Patient leans: N/A            Psychiatric Specialty Exam:  Presentation  General Appearance: Appropriate for Environment; Casual  Eye Contact:Fair  Speech:Clear and Coherent  Speech Volume:Normal  Handedness:Right   Mood and Affect  Mood:Depressed; Dysphoric; Hopeless  Affect:Congruent; Constricted   Thought Process  Thought Processes:Goal Directed; Coherent  Descriptions of Associations:Intact  Orientation:Full (Time, Place and Person)  Thought Content:Logical; Rumination  History of Schizophrenia/Schizoaffective disorder:No  Duration of Psychotic Symptoms:N/A  Hallucinations:Hallucinations: None  Ideas of Reference:None  Suicidal Thoughts:Suicidal Thoughts: Yes, Active SI Active Intent and/or Plan: With Intent; With Plan  Homicidal Thoughts:Homicidal Thoughts: No   Sensorium  Memory:Immediate Fair; Remote Fair; Recent Fair  Judgment:Poor  Insight:Shallow   Executive Functions  Concentration:Fair  Attention Span:Fair  Recall:Fair  Fund of Knowledge:Fair  Language:Fair   Psychomotor Activity  Psychomotor Activity:Psychomotor Activity: Decreased   Assets  Assets:Communication Skills; Desire for Improvement; Leisure Time   Sleep  Sleep:Sleep: Fair   Physical Exam: Physical Exam Review  of Systems  Constitutional:  Positive for malaise/fatigue. Negative for fever.  HENT: Negative.  Negative for congestion, ear discharge, hearing loss and sore throat.   Eyes: Negative.  Negative for discharge and redness.  Respiratory:  Negative for cough, shortness of breath and wheezing.   Cardiovascular:  Negative for chest pain and palpitations.  Psychiatric/Behavioral:  Positive for  depression and suicidal ideas.    Blood pressure (!) 116/58, pulse 71, temperature 98.3 F (36.8 C), temperature source Oral, resp. rate 12, height 5' 10.5" (1.791 m), weight 74.1 kg, SpO2 97 %. Body mass index is 23.11 kg/m.  Treatment Plan Summary: Daily contact with patient to assess and evaluate symptoms and progress in treatment and Plan Patient needs inpatient psychiatric care when medically stable Continue 1 :1 sitter for suicidal ideation  Disposition: Recommend psychiatric Inpatient admission when medically cleared. Discussed crisis plan, support from social network, calling 911, coming to the Emergency Department, and calling Suicide Hotline. Continue 1 :1 sitter for suicidal ideation Nelly Rout, MD 12/15/2021 11:56 AM

## 2021-12-15 NOTE — Progress Notes (Signed)
PROGRESS NOTE [name] [name].  [id number] DOB: Jul 12, 2000 DOA: 12/14/2021 PCP: [facility] [place]   Brief Narrative/Hospital Course: 21 year old male with significant medical history including type 1 diabetes on long-term insulin, hepatitis C, asthma, Crohn's disease, previous bowel obstruction multiple psychiatric illness w/ Borderline Personality Disorder, Bipolar Disorder, Generalized Convulsive Epilepsy, ADHD, Polysubstance Abuse Disorder, suicidal ideations, and medical non-compliance who presented to behavioral outpatient clinic voluntarily with suicidal ideation.  His best friend of 15 years died from overdose in front of him on 6/16, and his mother several months ago committed suicide and he was present when that happened.  Patient had suicidal thoughts to kill himself with a gun which he did purchase but decided he would have 1 last time to try to get help so seen at the behavioral clinic and sent to the ED.  Patient informed that if he was discharged and told to do outpatient therapy he would use the gun and kill himself. In ED, uncontrolled hyperglycemia blood glucose 724, normal anion gap suspecting hhnk, admitted on insulin drip.      Subjective: Seen and examined this morning.  Resting comfortably tolerating diet.  Endorses suicidal ideation one-to-one at bedside no other new complaints.   Assessment and Plan: Principal Problem:   Hyperosmolar hyperglycemic state (HHS) ([place]) Active Problems:   Uncontrolled type 1 diabetes mellitus with hyperglycemia, with long-term current use of insulin (HCC)   IVDU (intravenous drug user)   Attention deficit hyperactivity disorder (ADHD), combined type, moderate   Noncompliance   Schizoaffective disorder (HCC)   Hep C w/o coma, chronic (HCC)   Hyperglycemia due to type 1 diabetes mellitus ([place])   Tobacco abuse   Conversion disorder   Hypokalemia   Hyperosmolar hyperglycemic state Uncontrolled type 1 diabetes  mellitus with hyperglycemia, with long-term current use of insulin: patient reports he uses NovoLog 10 units 3 times daily, Lantus 40 units twice daily at home.  Transitioned off insulin drip to subcu insulin.  Blood sugar has improved.  Will increase Lantus further, Premeal insulin and continue SSI, monitor next 24 hours medicine service to adjust insulin.  Update HbA1c Recent Labs  Lab 12/15/21 0133 12/15/21 0245 12/15/21 0337 12/15/21 0549 12/15/21 0759  GLUCAP 164* 160* 157* 154* 175*    Hypertension: Stable on lisinopril 5 mg.  Abnormal LFTs AST/ALT/ALP 129/166/143- TB Nl> further downtrending 111/130/81.Ultrasound abdomen 6/9 fatty infiltration of the liver.  Repeating a.m.  History of hepatitis C needs outpatient follow-up  Hx of IVDU ADHD Borderline personality disorder Seizure disorder schizoaffective disorder Conversion disorder Suicidal ideation: Psychiatry consulted, will need inpatient psych admission continue one-to-one safety sitter.  On Seroquel, sertraline, Prozac- per Psych.  Medical noncompliance: TOC consult, counseling Tobacco abuse-cessation counseling Hypokalemia: We will replete aggressively and recheck DVT prophylaxis: enoxaparin (LOVENOX) injection 40 mg Start: 12/14/21 2200 Code Status:   Code Status: Full Code Family Communication: plan of care discussed with patient at bedside. Patient status is: Inpatient because of ongoing management of hyperglycemia hypokalemia need for one-to-one and psych consult Level of care: Stepdown   Dispo: The patient is from: home            Anticipated disposition: TBD-pending further medical clearance and input from psychiatry.  Mobility Assessment (last 72 hours)     Mobility Assessment   No documentation.            Objective: Vitals last 24 hrs: Vitals:   12/14/21 2335 12/15/21 0000 12/15/21 0400 12/15/21 0802  BP: (!) 139/91 Marland Kitchen)  139/91 120/62 (!) 116/58  Pulse: 82 80 74 71  Resp: 20 18 12    Temp:  98.2 F (36.8 C)  97.9 F (36.6 C) (!) 97 F (36.1 C)  TempSrc: Oral  Oral Axillary  SpO2: 97% 98% 98% 97%  Weight: 74.1 kg     Height: 5' 10.5" (1.791 m)      Weight change:   Physical Examination: General exam: alert awake,older than stated age, weak appearing. HEENT:Oral mucosa moist, Ear/Nose WNL grossly, dentition normal. Respiratory system: bilaterally clear BS, no use of accessory muscle Cardiovascular system: S1 & S2 +, No JVD. Gastrointestinal system: Abdomen soft,NT,ND, BS+ Nervous System:Alert, awake, moving extremities and grossly nonfocal Extremities: LE edema neg,distal peripheral pulses palpable.  Skin: No rashes,no icterus.  Tattoos all over the skin multiple sites MSK: Normal muscle bulk,tone, power  Medications reviewed:  Scheduled Meds:  Chlorhexidine Gluconate Cloth  6 each Topical Q2200   enoxaparin (LOVENOX) injection  40 mg Subcutaneous Q24H   FLUoxetine  10 mg Oral Daily   haloperidol  5 mg Oral Once   insulin aspart  0-15 Units Subcutaneous TID WC   insulin aspart  0-5 Units Subcutaneous QHS   insulin aspart  5 Units Subcutaneous TID WC   insulin glargine-yfgn  15 Units Subcutaneous BID   lisinopril  10 mg Oral Daily   QUEtiapine  75 mg Oral QHS   sertraline  100 mg Oral Daily   tamsulosin  0.4 mg Oral Daily   Continuous Infusions:      Diet Order             Diet Carb Modified Fluid consistency: Thin; Room service appropriate? Yes  Diet effective now                            Intake/Output Summary (Last 24 hours) at 12/15/2021 1039 Last data filed at 12/15/2021 0911 Gross per 24 hour  Intake 2356.21 ml  Output --  Net 2356.21 ml   Net IO Since Admission: 2,356.21 mL [12/15/21 1039]  Wt Readings from Last 3 Encounters:  12/14/21 74.1 kg  11/30/21 78 kg  11/18/21 72.3 kg     Unresulted Labs (From admission, onward)     Start     Ordered   12/21/21 0500  Creatinine, serum  (enoxaparin (LOVENOX)    CrCl >/= 30 ml/min)   Weekly,   R     Comments: while on enoxaparin therapy    12/14/21 2022   12/15/21 1036  Hemoglobin A1c  Add-on,   AD       Question:  Specimen collection method  Answer:  Lab=Lab collect   12/15/21 1037   12/15/21 [id number]  Basic metabolic panel  Daily,   R     Question:  Specimen collection method  Answer:  Lab=Lab collect   12/15/21 0311   12/14/21 2057  Osmolality  Once,   R        12/14/21 2057          Data Reviewed: I have personally reviewed following labs and imaging studies CBC: Recent Labs  Lab 12/14/21 1636 12/14/21 2026  WBC 6.8 6.2  HGB 14.6 13.8  HCT 42.3 39.3  MCV 80.0 79.2*  PLT 277 [id number]   Basic Metabolic Panel: Recent Labs  Lab 12/14/21 1636 12/14/21 2026 12/15/21 0010 12/15/21 0455  NA 127* 131* 138 138  K 4.1 3.9 3.1* 2.9*  CL 91* 94* 104 104  CO2  22 25 26 26   GLUCOSE 724* 506* 207* 143*  BUN 13 14 10 7   CREATININE 0.80 0.56* 0.43* 0.47*  CALCIUM 8.7* 9.0 8.7* 8.4*   GFR: Estimated Creatinine Clearance: 151.8 mL/min (A) (by C-G formula based on SCr of 0.47 mg/dL (L)). Liver Function Tests: Recent Labs  Lab 12/14/21 1636 12/15/21 0818  AST 129* 111*  ALT 166* 130*  ALKPHOS 143* 81  BILITOT 1.1 0.5  PROT 7.8 6.3*  ALBUMIN 4.1 3.4*   Recent Labs  Lab 12/14/21 2026  LIPASE 33   No results for input(s): "AMMONIA" in the last 168 hours. Coagulation Profile: No results for input(s): "INR", "PROTIME" in the last 168 hours. BNP (last 3 results) No results for input(s): "PROBNP" in the last 8760 hours. HbA1C: No results for input(s): "HGBA1C" in the last 72 hours. CBG: Recent Labs  Lab 12/15/21 0133 12/15/21 0245 12/15/21 0337 12/15/21 0549 12/15/21 0759  GLUCAP 164* 160* 157* 154* 175*   Lipid Profile: No results for input(s): "CHOL", "HDL", "LDLCALC", "TRIG", "CHOLHDL", "LDLDIRECT" in the last 72 hours. Thyroid Function Tests: No results for input(s): "TSH", "T4TOTAL", "FREET4", "T3FREE", "THYROIDAB" in the last 72  hours. Sepsis Labs: No results for input(s): "PROCALCITON", "LATICACIDVEN" in the last 168 hours.  Recent Results (from the past 240 hour(s))  SARS Coronavirus 2 by RT PCR (hospital order, performed in [facility] hospital lab) *cepheid single result test* Anterior Nasal Swab     Status: None   Collection Time: 12/06/21 12:50 AM   Specimen: Anterior Nasal Swab  Result Value Ref Range Status   SARS Coronavirus 2 by RT PCR NEGATIVE NEGATIVE Final    Comment: (NOTE) SARS-CoV-2 target nucleic acids are NOT DETECTED.  The SARS-CoV-2 RNA is generally detectable in upper and lower respiratory specimens during the acute phase of infection. The lowest concentration of SARS-CoV-2 viral copies this assay can detect is 250 copies / mL. A negative result does not preclude SARS-CoV-2 infection and should not be used as the sole basis for treatment or other patient management decisions.  A negative result may occur with improper specimen collection / handling, submission of specimen other than nasopharyngeal swab, presence of viral mutation(s) within the areas targeted by this assay, and inadequate number of viral copies (<250 copies / mL). A negative result must be combined with clinical observations, patient history, and epidemiological information.  Fact Sheet for Patients:   [facility]  Fact Sheet for Healthcare Providers: 02/05/22  This test is not yet approved or  cleared by the [employer] FDA and has been authorized for detection and/or diagnosis of SARS-CoV-2 by FDA under an Emergency Use Authorization (EUA).  This EUA will remain in effect (meaning this test can be used) for the duration of the COVID-19 declaration under Section 564(b)(1) of the Act, 21 U.S.C. section 360bbb-3(b)(1), unless the authorization is terminated or revoked sooner.  Performed at [facility], [address] [place], [address] [place]   MRSA Next Gen by PCR, Nasal     Status: None   Collection Time: 12/14/21 11:21 PM   Specimen: Nasal Mucosa; Nasal Swab  Result Value Ref Range Status   MRSA by PCR Next Gen NOT DETECTED NOT DETECTED Final    Comment: (NOTE) The GeneXpert MRSA Assay (FDA approved for NASAL specimens only), is one component of a comprehensive MRSA colonization surveillance program. It is not intended to diagnose MRSA infection nor to guide or monitor treatment for MRSA infections. Test performance is not FDA  approved in patients less than 3 years old. Performed at [facility], [address] [place], [place] [address]     Antimicrobials: Anti-infectives (From admission, onward)    None      Culture/Microbiology    Component Value Date/Time   SDES BLOOD RIGHT FOREARM 09/30/2021 0712   SPECREQUEST  09/30/2021 0712    BOTTLES DRAWN AEROBIC AND ANAEROBIC Blood Culture adequate volume   CULT  09/30/2021 0712    NO GROWTH 5 DAYS Performed at [facility], [address], [place], [place] [address]    REPTSTATUS 10/05/2021 FINAL 09/30/2021 6384   Radiology Studies: No results found.   LOS: 1 day   Lanae Boast, MD [facility]  12/15/2021, 10:39 AM

## 2021-12-15 NOTE — Hospital Course (Addendum)
21 year old male with significant medical history including type 1 diabetes on long-term insulin, hepatitis C, asthma, Crohn's disease, previous bowel obstruction multiple psychiatric illness w/ Borderline Personality Disorder, Bipolar Disorder, Generalized Convulsive Epilepsy, ADHD, Polysubstance Abuse Disorder, suicidal ideations, and medical non-compliance who presented to behavioral outpatient clinic voluntarily with suicidal ideation.  His best friend of 15 years died from overdose in front of him on 6/16, and his mother several months ago committed suicide and he was present when that happened.  Patient had suicidal thoughts to kill himself with a gun which he did purchase but decided he would have 1 last time to try to get help so seen at the behavioral clinic and sent to the ED.  Patient informed that if he was discharged and told to do outpatient therapy he would use the gun and kill himself. In ED, uncontrolled hyperglycemia blood glucose 724, normal anion gap suspecting hhnk, admitted on insulin drip.

## 2021-12-15 NOTE — Inpatient Diabetes Management (Signed)
Inpatient Diabetes Program Recommendations  AACE/ADA: New Consensus Statement on Inpatient Glycemic Control (2015)  Target Ranges:  Prepandial:   less than 140 mg/dL      Peak postprandial:   less than 180 mg/dL (1-2 hours)      Critically ill patients:  140 - 180 mg/dL   Lab Results  Component Value Date   GLUCAP 299 (H) 12/15/2021   HGBA1C 8.8 (H) 08/13/2021    Review of Glycemic Control  Diabetes history: DM 1 Outpatient Diabetes medications:  Lantus 40 units bid Novolog 8 units tid with meals Current orders for Inpatient glycemic control: Semglee 15 units bid, Novolog 5 units tid meal coverage, Novolog 0-15 units tid, hs 0-5 correction  Inpatient Diabetes Program Recommendations:   Patient well known to DM coordinators. Agree with above regimen and will follow and assist as needed.  Thank you, Billy Fischer. Constantina Laseter, RN, MSN, CDE  Diabetes Coordinator Inpatient Glycemic Control Team Team Pager [phone] (8am-5pm) 12/15/2021 1:20 PM

## 2021-12-16 DIAGNOSIS — E1100 Type 2 diabetes mellitus with hyperosmolarity without nonketotic hyperglycemic-hyperosmolar coma (NKHHC): Secondary | ICD-10-CM | POA: Diagnosis not present

## 2021-12-16 LAB — BASIC METABOLIC PANEL
Anion gap: 8 (ref 5–15)
Anion gap: 5 (ref 5–15)
BUN: 14 mg/dL (ref 6–20)
BUN: 11 mg/dL (ref 6–20)
CO2: 25 mmol/L (ref 22–32)
CO2: 25 mmol/L (ref 22–32)
Calcium: 8.2 mg/dL — ABNORMAL LOW (ref 8.9–10.3)
Calcium: 8.2 mg/dL — ABNORMAL LOW (ref 8.9–10.3)
Chloride: 108 mmol/L (ref 98–111)
Chloride: 112 mmol/L — ABNORMAL HIGH (ref 98–111)
Creatinine, Ser: 0.52 mg/dL — ABNORMAL LOW (ref 0.61–1.24)
Creatinine, Ser: 0.43 mg/dL — ABNORMAL LOW (ref 0.61–1.24)
GFR, Estimated: >60 mL/min (ref >60)
GFR, Estimated: >60 mL/min (ref >60)
Glucose, Bld: 272 mg/dL — ABNORMAL HIGH (ref 70–99)
Glucose, Bld: 111 mg/dL — ABNORMAL HIGH (ref 70–99)
Potassium: 3.6 mmol/L (ref 3.5–5.1)
Potassium: 3.4 mmol/L — ABNORMAL LOW (ref 3.5–5.1)
Sodium: 141 mmol/L (ref 135–145)
Sodium: 142 mmol/L (ref 135–145)

## 2021-12-16 LAB — HEMOGLOBIN A1C
Hgb A1c MFr Bld: 9.8 % — ABNORMAL HIGH (ref 4.8–5.6)
Mean Plasma Glucose: 234.56 mg/dL

## 2021-12-16 LAB — GLUCOSE, CAPILLARY
Glucose-Capillary: 102 mg/dL — ABNORMAL HIGH (ref 70–99)
Glucose-Capillary: 167 mg/dL — ABNORMAL HIGH (ref 70–99)
Glucose-Capillary: 232 mg/dL — ABNORMAL HIGH (ref 70–99)
Glucose-Capillary: 250 mg/dL — ABNORMAL HIGH (ref 70–99)
Glucose-Capillary: 271 mg/dL — ABNORMAL HIGH (ref 70–99)
Glucose-Capillary: 368 mg/dL — ABNORMAL HIGH (ref 70–99)
Glucose-Capillary: 84 mg/dL (ref 70–99)

## 2021-12-16 MED ORDER — INSULIN ASPART 100 UNIT/ML IJ SOLN
7.0000 [IU] | Freq: Three times a day (TID) | INTRAMUSCULAR | Status: DC
Start: 2021-12-16 — End: 2021-12-17
  Administered 2021-12-16 (×2): 7 [IU] via SUBCUTANEOUS

## 2021-12-16 MED ORDER — INSULIN ASPART 100 UNIT/ML IJ SOLN
7.0000 [IU] | Freq: Three times a day (TID) | INTRAMUSCULAR | Status: DC
Start: 2021-12-16 — End: 2021-12-16

## 2021-12-16 MED ORDER — DEXMEDETOMIDINE HCL IN NACL 200 MCG/50ML IV SOLN
INTRAVENOUS | Status: AC
  Filled 2021-12-16: qty 50

## 2021-12-16 MED ORDER — INSULIN GLARGINE-YFGN 100 UNIT/ML ~~LOC~~ SOLN
30.0000 [IU] | Freq: Two times a day (BID) | SUBCUTANEOUS | Status: DC
  Administered 2021-12-16: 30 [IU] via SUBCUTANEOUS
  Filled 2021-12-16 (×2): qty 0.3

## 2021-12-16 MED ORDER — POTASSIUM CHLORIDE CRYS ER 20 MEQ PO TBCR
40.0000 meq | EXTENDED_RELEASE_TABLET | Freq: Once | ORAL | Status: AC
  Administered 2021-12-16: 40 meq via ORAL
  Filled 2021-12-16: qty 2

## 2021-12-16 MED ORDER — INSULIN ASPART 100 UNIT/ML IJ SOLN
0.0000 [IU] | INTRAMUSCULAR | Status: DC
  Administered 2021-12-16: 5 [IU] via SUBCUTANEOUS
  Administered 2021-12-16: 2 [IU] via SUBCUTANEOUS
  Administered 2021-12-16 – 2021-12-17 (×4): 3 [IU] via SUBCUTANEOUS
  Administered 2021-12-17: 5 [IU] via SUBCUTANEOUS

## 2021-12-16 NOTE — Progress Notes (Addendum)
Patient with transfer orders and new bed assignment to 3 [facility] Room 1302. Report called to receiving nurse Lawernce Pitts RN on [address] [facility]. Handoff completed at this time.  Explained and educated patient that he is moving rooms. Two patient belonging bags stored at nurse's station per Suicide precautions protocol to move to new unit with him. One belonging bag contains a backpack and one belonging bag contains shoes.  Both belonging bags transported with patient to new unit.

## 2021-12-16 NOTE — Progress Notes (Signed)
PROGRESS NOTE [name] [name].  GDJ:[id number] DOB: 14-Dec-2000 DOA: 12/14/2021 PCP: [facility]   Brief Narrative/Hospital Course: 21 year old male with significant medical history including type 1 diabetes on long-term insulin, hepatitis C, asthma, Crohn's disease, previous bowel obstruction multiple psychiatric illness w/ Borderline Personality Disorder, Bipolar Disorder, Generalized Convulsive Epilepsy, ADHD, Polysubstance Abuse Disorder, suicidal ideations, and medical non-compliance who presented to behavioral outpatient clinic voluntarily with suicidal ideation.  His best friend of 15 years died from overdose in front of him on 6/16, and his mother several months ago committed suicide and he was present when that happened.  Patient had suicidal thoughts to kill himself with a gun which he did purchase but decided he would have 1 last time to try to get help so seen at the behavioral clinic and sent to the ED.  Patient informed that if he was discharged and told to do outpatient therapy he would use the gun and kill himself. In ED, uncontrolled hyperglycemia blood glucose 724, normal anion gap suspecting hhnk, admitted on insulin drip.  Blood sugar stabilized transition from insulin drip to subcu insulin monitor additional night insulin registered further, overall blood sugar stabilized.  Seen by psychiatry will need inpatient psych admission.   Subjective: Seen and examined this morning.  One-to-one at bedside no new complaints.   Blood sugar improving  Assessment and Plan: Principal Problem:   Hyperosmolar hyperglycemic state (HHS) (HCC) Active Problems:   Uncontrolled type 1 diabetes mellitus with hyperglycemia, with long-term current use of insulin (HCC)   IVDU (intravenous drug user)   Attention deficit hyperactivity disorder (ADHD), combined type, moderate   Noncompliance   Schizoaffective disorder (HCC)   Hep C w/o coma, chronic (HCC)   Hyperglycemia due  to type 1 diabetes mellitus (HCC)   Tobacco abuse   Conversion disorder   Hypokalemia   Hyperosmolar hyperglycemic state Uncontrolled type 1 diabetes mellitus with hyperglycemia, with long-term current use of insulin: patient reports he uses NovoLog 10 units 3 times daily, Lantus 40 units twice daily at home.  Transitioned off insulin drip to subcu insulin-blood sugar much improved on current home regimen along with SSI-if downtrends would adjust insulin regimen further avoid hypoglycemia-keep on q4 sugar check.Avoid snacks/high carb diet monitor.okay to transfer out of stepdown.  HbA1c poorly controlled at 9.8 Recent Labs  Lab 12/15/21 1445 12/15/21 1645 12/15/21 2001 12/16/21 0042 12/16/21 0402 12/16/21 0743  GLUCAP  --  495* 264* 250* 167* 102*  HGBA1C 9.8*  --   --   --   --   --    Hypertension: Controlled on LIsinopril 5 mg.  Abnormal LFTs AST/ALT/ALP 129/166/143- TB Nl> further downtrending 111/130/81.Ultrasound abdomen 6/9 fatty infiltration of the liver.  Monitor intermittently  History of hepatitis C needs outpatient follow-up  Hx of IVDU ADHD Borderline personality disorder Seizure disorder schizoaffective disorder Conversion disorder Suicidal ideation: Psychiatry consulted-input appreciated,will need inpatient psych admission, declined at [facility] need to fax out to outside facility, cont one-to-one Recruitment consultant. cont Seroquel, sertraline, Prozac- as per Psych.  Medical noncompliance: TOC consulted, counseling Tobacco abuse-cessation counseling Hypokalemia: We will replete aggressively and recheck DVT prophylaxis: enoxaparin (LOVENOX) injection 40 mg Start: 12/14/21 2200 Code Status:   Code Status: Full Code Family Communication: plan of care discussed with patient at bedside. Patient status is: Inpatient pending discharge to inpatient psych  Level of care: Med-Surg  Dispo: The patient is from: home            Anticipated disposition:  Awaiting inpatient psych  bed. Patient at this time is medically stable for discharge to inpatient psych once bed available  Mobility Assessment (last 72 hours)     Mobility Assessment   No documentation.            Objective: Vitals last 24 hrs: Vitals:   12/16/21 0057 12/16/21 0406 12/16/21 0747 12/16/21 0800  BP: 116/66 119/65  118/73  Pulse: 72 69  72  Resp: (!) 22 17  17   Temp: 97.7 F (36.5 C) (!) 97.5 F (36.4 C) 98 F (36.7 C)   TempSrc: Axillary Axillary Oral   SpO2: 97% 98%  97%  Weight:      Height:       Weight change:   Physical Examination: General exam: AA0X3, not in distress, pleasant older than stated age, weak appearing. HEENT:Oral mucosa moist, Ear/Nose WNL grossly, dentition normal. Respiratory system: bilaterally clear no use of accessory muscle Cardiovascular system: S1 & S2 +, No JVD,. Gastrointestinal system: Abdomen soft,NT,ND,BS+ Nervous System:Alert, awake, moving extremities and grossly nonfocal Extremities: LE ankle edema NEG, distal peripheral pulses palpable.  Skin: No rashes,no icterus. MSK: Normal muscle bulk,tone, power   Medications reviewed:  Scheduled Meds:  Chlorhexidine Gluconate Cloth  6 each Topical Q2200   enoxaparin (LOVENOX) injection  40 mg Subcutaneous Q24H   FLUoxetine  10 mg Oral Daily   haloperidol  5 mg Oral Once   insulin aspart  0-5 Units Subcutaneous QHS   insulin aspart  0-9 Units Subcutaneous Q4H   insulin aspart  10 Units Subcutaneous TID WC   insulin glargine-yfgn  40 Units Subcutaneous BID   lisinopril  10 mg Oral Daily   QUEtiapine  75 mg Oral QHS   sertraline  100 mg Oral Daily   tamsulosin  0.4 mg Oral Daily   Continuous Infusions: \   Diet Order             Diet Carb Modified Fluid consistency: Thin; Room service appropriate? Yes  Diet effective now                  Intake/Output Summary (Last 24 hours) at 12/16/2021 1036 Last data filed at 12/16/2021 0831 Gross per 24 hour  Intake 3332.63 ml  Output 0 ml   Net 3332.63 ml    Net IO Since Admission: 5,688.84 mL [12/16/21 1036]  Wt Readings from Last 3 Encounters:  12/14/21 74.1 kg  11/30/21 78 kg  11/18/21 72.3 kg     Unresulted Labs (From admission, onward)     Start     Ordered   12/21/21 0500  Creatinine, serum  (enoxaparin (LOVENOX)    CrCl >/= 30 ml/min)  Weekly,   R     Comments: while on enoxaparin therapy    12/14/21 2022   12/15/21 0500  Basic metabolic panel  Daily,   R     Question:  Specimen collection method  Answer:  Lab=Lab collect   12/15/21 0311          Data Reviewed: I have personally reviewed following labs and imaging studies CBC: Recent Labs  Lab 12/14/21 1636 12/14/21 2026  WBC 6.8 6.2  HGB 14.6 13.8  HCT 42.3 39.3  MCV 80.0 79.2*  PLT 277 265    Basic Metabolic Panel: Recent Labs  Lab 12/15/21 0010 12/15/21 0455 12/15/21 0818 12/15/21 1445 12/16/21 0031 12/16/21 0658  NA 138 138 137  --  141 142  K 3.1* 2.9* 3.0* 4.5 3.6 3.4*  CL  104 104 105  --  108 112*  CO2 26 26 24   --  25 25  GLUCOSE 207* 143* 133*  --  272* 111*  BUN 10 7 8   --  14 11  CREATININE 0.43* 0.47* 0.39*  --  0.52* 0.43*  CALCIUM 8.7* 8.4* 8.5*  --  8.2* 8.2*    GFR: Estimated Creatinine Clearance: 151.8 mL/min (A) (by C-G formula based on SCr of 0.43 mg/dL (L)). Liver Function Tests: Recent Labs  Lab 12/14/21 1636 12/15/21 0818  AST 129* 111*  ALT 166* 130*  ALKPHOS 143* 81  BILITOT 1.1 0.5  PROT 7.8 6.3*  ALBUMIN 4.1 3.4*    Recent Labs  Lab 12/14/21 2026  LIPASE 33    No results for input(s): "AMMONIA" in the last 168 hours. Coagulation Profile: No results for input(s): "INR", "PROTIME" in the last 168 hours. BNP (last 3 results) No results for input(s): "PROBNP" in the last 8760 hours. HbA1C: Recent Labs    12/15/21 1445  HGBA1C 9.8*   CBG: Recent Labs  Lab 12/15/21 1645 12/15/21 2001 12/16/21 0042 12/16/21 0402 12/16/21 0743  GLUCAP 495* 264* 250* 167* 102*    Lipid  Profile: No results for input(s): "CHOL", "HDL", "LDLCALC", "TRIG", "CHOLHDL", "LDLDIRECT" in the last 72 hours. Thyroid Function Tests: No results for input(s): "TSH", "T4TOTAL", "FREET4", "T3FREE", "THYROIDAB" in the last 72 hours. Sepsis Labs: No results for input(s): "PROCALCITON", "LATICACIDVEN" in the last 168 hours.  Recent Results (from the past 240 hour(s))  MRSA Next Gen by PCR, Nasal     Status: None   Collection Time: 12/14/21 11:21 PM   Specimen: Nasal Mucosa; Nasal Swab  Result Value Ref Range Status   MRSA by PCR Next Gen NOT DETECTED NOT DETECTED Final    Comment: (NOTE) The GeneXpert MRSA Assay (FDA approved for NASAL specimens only), is one component of a comprehensive MRSA colonization surveillance program. It is not intended to diagnose MRSA infection nor to guide or monitor treatment for MRSA infections. Test performance is not FDA approved in patients less than 89 years old. Performed at [facility], [address] [place], [place]     Antimicrobials: Anti-infectives (From admission, onward)    None      Culture/Microbiology    Component Value Date/Time   SDES BLOOD RIGHT FOREARM 09/30/2021 0712   SPECREQUEST  09/30/2021 0712    BOTTLES DRAWN AEROBIC AND ANAEROBIC Blood Culture adequate volume   CULT  09/30/2021 0712    NO GROWTH 5 DAYS Performed at [facility], [address] [place], [address] [place]    REPTSTATUS 10/05/2021 FINAL 09/30/2021 12/05/2021   Radiology Studies: No results found.   LOS: 2 days   11/30/2021, MD [facility]  12/16/2021, 10:36 AM

## 2021-12-16 NOTE — TOC Progression Note (Signed)
Transition of Care (TOC) - Progression Note    Patient Details  Name: [name] [name]. MRN: [id number] Date of Birth: 2000/08/13  Transition of Care [facility]) CM/SW Contact  Geni Bers, RN Phone Number: 12/16/2021, 12:00 PM  Clinical Narrative:     Pt was faxed out to other Psych Facilities. Waiting on a bed offer.   Expected Discharge Plan: Homeless Shelter Barriers to Discharge: No Barriers Identified  Expected Discharge Plan and Services Expected Discharge Plan: Homeless Shelter       Living arrangements for the past 2 months: Homeless                                       Social Determinants of Health (SDOH) Interventions    Readmission Risk Interventions    11/07/2021    3:11 PM  Readmission Risk Prevention Plan  Medication Review (RN Care Manager) Complete  HRI or Home Care Consult Complete  SW Recovery Care/Counseling Consult Complete  Palliative Care Screening Not Applicable  Skilled Nursing Facility Not Applicable

## 2021-12-17 DIAGNOSIS — E1100 Type 2 diabetes mellitus with hyperosmolarity without nonketotic hyperglycemic-hyperosmolar coma (NKHHC): Secondary | ICD-10-CM | POA: Diagnosis not present

## 2021-12-17 LAB — BASIC METABOLIC PANEL
Anion gap: 6 (ref 5–15)
BUN: 14 mg/dL (ref 6–20)
CO2: 26 mmol/L (ref 22–32)
Calcium: 8.8 mg/dL — ABNORMAL LOW (ref 8.9–10.3)
Chloride: 107 mmol/L (ref 98–111)
Creatinine, Ser: 0.52 mg/dL — ABNORMAL LOW (ref 0.61–1.24)
GFR, Estimated: >60 mL/min (ref >60)
Glucose, Bld: 232 mg/dL — ABNORMAL HIGH (ref 70–99)
Potassium: 4 mmol/L (ref 3.5–5.1)
Sodium: 139 mmol/L (ref 135–145)

## 2021-12-17 LAB — GLUCOSE, CAPILLARY
Glucose-Capillary: 297 mg/dL — ABNORMAL HIGH (ref 70–99)
Glucose-Capillary: 232 mg/dL — ABNORMAL HIGH (ref 70–99)
Glucose-Capillary: 204 mg/dL — ABNORMAL HIGH (ref 70–99)
Glucose-Capillary: 118 mg/dL — ABNORMAL HIGH (ref 70–99)
Glucose-Capillary: 554 mg/dL (ref 70–99)
Glucose-Capillary: 237 mg/dL — ABNORMAL HIGH (ref 70–99)

## 2021-12-17 MED ORDER — INSULIN ASPART 100 UNIT/ML IJ SOLN
15.0000 [IU] | Freq: Once | INTRAMUSCULAR | Status: AC
  Administered 2021-12-17: 15 [IU] via SUBCUTANEOUS

## 2021-12-17 MED ORDER — INSULIN ASPART 100 UNIT/ML IJ SOLN
0.0000 [IU] | Freq: Three times a day (TID) | INTRAMUSCULAR | Status: DC
  Administered 2021-12-18 (×2): 3 [IU] via SUBCUTANEOUS
  Administered 2021-12-18: 2 [IU] via SUBCUTANEOUS

## 2021-12-17 MED ORDER — INSULIN ASPART 100 UNIT/ML IJ SOLN
10.0000 [IU] | Freq: Three times a day (TID) | INTRAMUSCULAR | Status: DC
Start: 2021-12-17 — End: 2021-12-19
  Administered 2021-12-17 – 2021-12-19 (×6): 10 [IU] via SUBCUTANEOUS

## 2021-12-17 MED ORDER — INSULIN GLARGINE-YFGN 100 UNIT/ML ~~LOC~~ SOLN
40.0000 [IU] | Freq: Two times a day (BID) | SUBCUTANEOUS | Status: DC
  Administered 2021-12-17 – 2021-12-18 (×3): 40 [IU] via SUBCUTANEOUS
  Filled 2021-12-17 (×4): qty 0.4

## 2021-12-17 NOTE — Progress Notes (Signed)
PROGRESS NOTE [name] [name].  FKC:[id number] DOB: 2000-10-24 DOA: 12/14/2021 PCP: [facility]   Brief Narrative/Hospital Course: 21 year old male with significant medical history including type 1 diabetes on long-term insulin, hepatitis C, asthma, Crohn's disease, previous bowel obstruction multiple psychiatric illness w/ Borderline Personality Disorder, Bipolar Disorder, Generalized Convulsive Epilepsy, ADHD, Polysubstance Abuse Disorder, suicidal ideations, and medical non-compliance who presented to behavioral outpatient clinic voluntarily with suicidal ideation.  His best friend of 15 years died from overdose in front of him on 6/16, and his mother several months ago committed suicide and he was present when that happened.  Patient had suicidal thoughts to kill himself with a gun which he did purchase but decided he would have 1 last time to try to get help so seen at the behavioral clinic and sent to the ED.  Patient informed that if he was discharged and told to do outpatient therapy he would use the gun and kill himself. In ED, uncontrolled hyperglycemia blood glucose 724, normal anion gap suspecting hhnk, admitted on insulin drip.  Blood sugar stabilized transition from insulin drip to subcu insulin monitor additional night insulin registered further, overall blood sugar stabilized.  Seen by psychiatry will need inpatient psych admission.   Subjective: Seen and examined.  One-to-one at bedside. No new complaints. Overnight blood sugar has been trending up after insulin was decreased due to sugar trending down. Overnight no fever BP stable Has been eating his snacks in between Assessment and Plan: Principal Problem:   Hyperosmolar hyperglycemic state (HHS) (HCC) Active Problems:   Uncontrolled type 1 diabetes mellitus with hyperglycemia, with long-term current use of insulin (HCC)   IVDU (intravenous drug user)   Attention deficit hyperactivity disorder  (ADHD), combined type, moderate   Noncompliance   Schizoaffective disorder (HCC)   Hep C w/o coma, chronic (HCC)   Hyperglycemia due to type 1 diabetes mellitus (HCC)   Tobacco abuse   Conversion disorder   Hypokalemia   Hyperosmolar hyperglycemic state Uncontrolled type 1 diabetes mellitus with hyperglycemia, with long-term current use of insulin: At home on NovoLog 10 units  tid, Lantus 40 units bid.  HHS resolved>transitioned off insulin drip to subcu insulin-blood sugar was downtrending Lantus and Premeal insulin was cut down now blood sugar poorly controlled will increase to home dose Premeal and long-acting insulin along with SSI.Avoid snacks/high carb diet monitor.okay to transfer out of stepdown.  HbA1c poorly controlled at 9.8 Recent Labs  Lab 12/15/21 1445 12/15/21 1645 12/16/21 2018 12/16/21 2325 12/17/21 0135 12/17/21 0506 12/17/21 0733  GLUCAP  --    < > 232* 368* 297* 232* 204*  HGBA1C 9.8*  --   --   --   --   --   --    < > = values in this interval not displayed.  Hypertension: Stable on LIsinopril 5 mg.  Abnormal LFTs AST/ALT/ALP 129/166/143- TB Nl> further downtrending 111/130/81.Ultrasound abdomen 12/06/21 fatty infiltration of the liver.  Monitor intermittently  History of hepatitis C needs outpatient follow-up  Hx of IVDU ADHD Borderline personality disorder Seizure disorder schizoaffective disorder Conversion disorder Suicidal ideation: Psychiatry following closely at this time need inpatient psych admission,declined at [facility], [facility] faxed out to other facilities-waiting for bed offers Cont one-to-one safety sitter. cont Seroquel, sertraline, Prozac- as per Psych.  Medical noncompliance: Needs to be better compliant with insulin regimen/diet/therapy recommendation.  Discussed about dietary compliance Tobacco abuse-cessation counseling Hypokalemia: Resolved   DVT prophylaxis: enoxaparin (LOVENOX) injection 40 mg Start: 12/14/21 2200  Code Status:   Code  Status: Full Code Family Communication: plan of care discussed with patient at bedside. Patient status is: Inpatient pending discharge to inpatient psych  Level of care: Med-Surg  Dispo: The patient is from: home            Anticipated disposition: Awaiting inpatient psych bed. Patient at this time is medically stable for discharge to inpatient psych once bed available  Mobility Assessment (last 72 hours)     Mobility Assessment     Row Name 12/16/21 2030 12/16/21 0800         Does patient have an order for bedrest or is patient medically unstable No - Continue assessment No - Continue assessment      What is the highest level of mobility based on the progressive mobility assessment? Level 6 (Walks independently in room and hall) - Balance while walking in room without assist - Complete Level 6 (Walks independently in room and hall) - Balance while walking in room without assist - Complete                Objective: Vitals last 24 hrs: Vitals:   12/16/21 1547 12/16/21 2016 12/16/21 2322 12/17/21 0417  BP:  (!) 145/87 (!) 108/58 (!) 103/53  Pulse:  74 87 76  Resp:  17 16 18   Temp: 98 F (36.7 C) 98.8 F (37.1 C) 98.4 F (36.9 C) 98.4 F (36.9 C)  TempSrc: Oral Oral Oral Oral  SpO2:  99% 99% 98%  Weight:      Height:       Weight change:   Physical Examination: General exam: AA0x3, older than stated age, weak appearing. HEENT:Oral mucosa moist, Ear/Nose WNL grossly, dentition normal. Respiratory system: bilaterally diminished, no use of accessory muscle Cardiovascular system: S1 & S2 +, No JVD,. Gastrointestinal system: Abdomen soft,NT,ND,BS+ Nervous System:Alert, awake, moving extremities and grossly nonfocal Extremities: LE ankle edema neg, distal peripheral pulses palpable.  Skin: Multiple tattoos on skin  MSK: Normal muscle bulk,tone, power   Medications reviewed:  Scheduled Meds:  enoxaparin (LOVENOX) injection  40 mg Subcutaneous Q24H   FLUoxetine  10 mg  Oral Daily   haloperidol  5 mg Oral Once   insulin aspart  0-9 Units Subcutaneous Q4H   insulin aspart  10 Units Subcutaneous TID WC   insulin glargine-yfgn  40 Units Subcutaneous BID   lisinopril  10 mg Oral Daily   QUEtiapine  75 mg Oral QHS   sertraline  100 mg Oral Daily   tamsulosin  0.4 mg Oral Daily   Continuous Infusions: \   Diet Order             Diet Carb Modified Fluid consistency: Thin; Room service appropriate? Yes  Diet effective now                  Intake/Output Summary (Last 24 hours) at 12/17/2021 0958 Last data filed at 12/17/2021 0600 Gross per 24 hour  Intake 1690.28 ml  Output --  Net 1690.28 ml   Net IO Since Admission: 7,379.12 mL [12/17/21 0958]  Wt Readings from Last 3 Encounters:  12/14/21 74.1 kg  11/30/21 78 kg  11/18/21 72.3 kg     Unresulted Labs (From admission, onward)     Start     Ordered   12/21/21 0500  Creatinine, serum  (enoxaparin (LOVENOX)    CrCl >/= 30 ml/min)  Weekly,   R     Comments: while on enoxaparin therapy  12/14/21 2022   12/15/21 0500  Basic metabolic panel  Daily,   R     Question:  Specimen collection method  Answer:  Lab=Lab collect   12/15/21 0311          Data Reviewed: I have personally reviewed following labs and imaging studies CBC: Recent Labs  Lab 12/14/21 1636 12/14/21 2026  WBC 6.8 6.2  HGB 14.6 13.8  HCT 42.3 39.3  MCV 80.0 79.2*  PLT 277 265   Basic Metabolic Panel: Recent Labs  Lab 12/15/21 0455 12/15/21 0818 12/15/21 1445 12/16/21 0031 12/16/21 0658 12/17/21 0437  NA 138 137  --  141 142 139  K 2.9* 3.0* 4.5 3.6 3.4* 4.0  CL 104 105  --  108 112* 107  CO2 26 24  --  25 25 26   GLUCOSE 143* 133*  --  272* 111* 232*  BUN 7 8  --  14 11 14   CREATININE 0.47* 0.39*  --  0.52* 0.43* 0.52*  CALCIUM 8.4* 8.5*  --  8.2* 8.2* 8.8*   GFR: Estimated Creatinine Clearance: 151.8 mL/min (A) (by C-G formula based on SCr of 0.52 mg/dL (L)). Liver Function Tests: Recent Labs  Lab  12/14/21 1636 12/15/21 0818  AST 129* 111*  ALT 166* 130*  ALKPHOS 143* 81  BILITOT 1.1 0.5  PROT 7.8 6.3*  ALBUMIN 4.1 3.4*   Recent Labs  Lab 12/14/21 2026  LIPASE 33   No results for input(s): "AMMONIA" in the last 168 hours. Coagulation Profile: No results for input(s): "INR", "PROTIME" in the last 168 hours. BNP (last 3 results) No results for input(s): "PROBNP" in the last 8760 hours. HbA1C: Recent Labs    12/15/21 1445  HGBA1C 9.8*   CBG: Recent Labs  Lab 12/16/21 2018 12/16/21 2325 12/17/21 0135 12/17/21 0506 12/17/21 0733  GLUCAP 232* 368* 297* 232* 204*   Lipid Profile: No results for input(s): "CHOL", "HDL", "LDLCALC", "TRIG", "CHOLHDL", "LDLDIRECT" in the last 72 hours. Thyroid Function Tests: No results for input(s): "TSH", "T4TOTAL", "FREET4", "T3FREE", "THYROIDAB" in the last 72 hours. Sepsis Labs: No results for input(s): "PROCALCITON", "LATICACIDVEN" in the last 168 hours.  Recent Results (from the past 240 hour(s))  MRSA Next Gen by PCR, Nasal     Status: None   Collection Time: 12/14/21 11:21 PM   Specimen: Nasal Mucosa; Nasal Swab  Result Value Ref Range Status   MRSA by PCR Next Gen NOT DETECTED NOT DETECTED Final    Comment: (NOTE) The GeneXpert MRSA Assay (FDA approved for NASAL specimens only), is one component of a comprehensive MRSA colonization surveillance program. It is not intended to diagnose MRSA infection nor to guide or monitor treatment for MRSA infections. Test performance is not FDA approved in patients less than 39 years old. Performed at [facility], [address] [place], [place]     Antimicrobials: Anti-infectives (From admission, onward)    None      Culture/Microbiology    Component Value Date/Time   SDES BLOOD RIGHT FOREARM 09/30/2021 0712   SPECREQUEST  09/30/2021 0712    BOTTLES DRAWN AEROBIC AND ANAEROBIC Blood Culture adequate volume   CULT  09/30/2021 0712    NO  GROWTH 5 DAYS Performed at [facility], [address], [place], [address] [place]    REPTSTATUS 10/05/2021 FINAL 09/30/2021 12/05/2021   Radiology Studies: No results found.   LOS: 3 days   11/30/2021, MD [facility]  12/17/2021, 9:58 AM

## 2021-12-17 NOTE — TOC Progression Note (Addendum)
Transition of Care (TOC) - Progression Note    Patient Details  Name: [name] [name]. MRN: [id number] Date of Birth: 01/07/2001  Transition of Care [facility]) CM/SW Contact  Halford Chessman Phone Number: 12/17/2021, 12:15 PM  Clinical Narrative:     Patient still does not have any bed offers for behavioral health.  TOC to continue looking for inpatient psych.   Expected Discharge Plan: Homeless Shelter Barriers to Discharge: No Barriers Identified  Expected Discharge Plan and Services Expected Discharge Plan: Homeless Shelter       Living arrangements for the past 2 months: Homeless                                       Social Determinants of Health (SDOH) Interventions    Readmission Risk Interventions    11/07/2021    3:11 PM  Readmission Risk Prevention Plan  Medication Review (RN Care Manager) Complete  HRI or Home Care Consult Complete  SW Recovery Care/Counseling Consult Complete  Palliative Care Screening Not Applicable  Skilled Nursing Facility Not Applicable

## 2021-12-18 DIAGNOSIS — R748 Abnormal levels of other serum enzymes: Secondary | ICD-10-CM

## 2021-12-18 DIAGNOSIS — F902 Attention-deficit hyperactivity disorder, combined type: Secondary | ICD-10-CM | POA: Diagnosis not present

## 2021-12-18 DIAGNOSIS — F250 Schizoaffective disorder, bipolar type: Secondary | ICD-10-CM

## 2021-12-18 DIAGNOSIS — Z91199 Patient's noncompliance with other medical treatment and regimen due to unspecified reason: Secondary | ICD-10-CM

## 2021-12-18 DIAGNOSIS — E1065 Type 1 diabetes mellitus with hyperglycemia: Principal | ICD-10-CM

## 2021-12-18 DIAGNOSIS — E876 Hypokalemia: Secondary | ICD-10-CM

## 2021-12-18 DIAGNOSIS — F449 Dissociative and conversion disorder, unspecified: Secondary | ICD-10-CM

## 2021-12-18 DIAGNOSIS — E1100 Type 2 diabetes mellitus with hyperosmolarity without nonketotic hyperglycemic-hyperosmolar coma (NKHHC): Secondary | ICD-10-CM | POA: Diagnosis not present

## 2021-12-18 DIAGNOSIS — Z720 Tobacco use: Secondary | ICD-10-CM

## 2021-12-18 DIAGNOSIS — B182 Chronic viral hepatitis C: Secondary | ICD-10-CM

## 2021-12-18 DIAGNOSIS — F1990 Other psychoactive substance use, unspecified, uncomplicated: Secondary | ICD-10-CM

## 2021-12-18 DIAGNOSIS — F259 Schizoaffective disorder, unspecified: Secondary | ICD-10-CM

## 2021-12-18 LAB — GLUCOSE, CAPILLARY
Glucose-Capillary: 175 mg/dL — ABNORMAL HIGH (ref 70–99)
Glucose-Capillary: 124 mg/dL — ABNORMAL HIGH (ref 70–99)
Glucose-Capillary: 322 mg/dL — ABNORMAL HIGH (ref 70–99)
Glucose-Capillary: 103 mg/dL — ABNORMAL HIGH (ref 70–99)

## 2021-12-18 LAB — HEPATIC FUNCTION PANEL
ALT: 191 U/L — ABNORMAL HIGH (ref 0–44)
AST: 172 U/L — ABNORMAL HIGH (ref 15–41)
Albumin: 3.5 g/dL (ref 3.5–5.0)
Alkaline Phosphatase: 97 U/L (ref 38–126)
Bilirubin, Direct: <0.1 mg/dL (ref 0.0–0.2)
Total Bilirubin: 0.7 mg/dL (ref 0.3–1.2)
Total Protein: 6.9 g/dL (ref 6.5–8.1)

## 2021-12-18 LAB — BASIC METABOLIC PANEL
Anion gap: 9 (ref 5–15)
BUN: 15 mg/dL (ref 6–20)
CO2: 24 mmol/L (ref 22–32)
Calcium: 8.6 mg/dL — ABNORMAL LOW (ref 8.9–10.3)
Chloride: 104 mmol/L (ref 98–111)
Creatinine, Ser: 0.42 mg/dL — ABNORMAL LOW (ref 0.61–1.24)
GFR, Estimated: >60 mL/min (ref >60)
Glucose, Bld: 227 mg/dL — ABNORMAL HIGH (ref 70–99)
Potassium: 3.8 mmol/L (ref 3.5–5.1)
Sodium: 137 mmol/L (ref 135–145)

## 2021-12-18 MED ORDER — INSULIN ASPART 100 UNIT/ML IJ SOLN
0.0000 [IU] | Freq: Three times a day (TID) | INTRAMUSCULAR | Status: DC
  Administered 2021-12-19: 3 [IU] via SUBCUTANEOUS

## 2021-12-18 MED ORDER — INSULIN GLARGINE-YFGN 100 UNIT/ML ~~LOC~~ SOLN
45.0000 [IU] | Freq: Two times a day (BID) | SUBCUTANEOUS | Status: DC
  Administered 2021-12-18 – 2021-12-19 (×2): 45 [IU] via SUBCUTANEOUS
  Filled 2021-12-18 (×3): qty 0.45

## 2021-12-18 MED ORDER — INSULIN ASPART 100 UNIT/ML IJ SOLN: 0.0000 [IU] | Freq: Every day | INTRAMUSCULAR | Status: DC

## 2021-12-18 NOTE — Consult Note (Signed)
[facility] Face-to-Face Psychiatry Consult   Reason for Consult: Suicidal ideation Referring Physician: Dr. Lanae Boast Patient Identification: [name] [name]. MRN:  [id number] Principal Diagnosis: Hyperosmolar hyperglycemic state (HHS) (HCC) Diagnosis:  Principal Problem:   Hyperosmolar hyperglycemic state (HHS) (HCC) Active Problems:   Uncontrolled type 1 diabetes mellitus with hyperglycemia, with long-term current use of insulin (HCC)   Attention deficit hyperactivity disorder (ADHD), combined type, moderate   Noncompliance   Hyperglycemia due to type 1 diabetes mellitus (HCC)   Schizoaffective disorder (HCC)   IVDU (intravenous drug user)   Tobacco abuse   Conversion disorder   Hep C w/o coma, chronic (HCC)   Hypokalemia   Total Time spent with patient: 15 minutes  Subjective:   [name] [name]. is a 21 y.o. male patient admitted with uncontrolled hyperglycemia with a blood sugar 724. Initially presented to behavioral health hospital for suicidal ideation with a plan to purchase a gun and kill himself.  Due to history of non compliance, DKA, and chest pain patient was sent for medical clearance, which revealed his blood sugar to be 724 with anion gap of 14.  Psychiatry consult service was asked to see patient due to suicidal ideations with a plan to shoot himself with a gun that he recently purchased.  Patient is well known to behavioral health service as patient has over 20+ emergency room visits, 16 hospital admissions, and 3 behavioral health hospital admissions within the past 5 months.  Patient has been unable to remain out of the hospital long enough to follow-up with outpatient providers, and chooses to be noncompliant with his treatment and care.  Despite having ACT team services in place and [facility] he continues to frequent local hospitals seeking care for type 1 diabetes, suicidal ideations, and chest pain.  HPI: Patient is a 21 year old male with a significant  medical history of type 1 diabetes, being on long-term insulin, hepatitis C, asthma, Crohn's disease, bipolar disorder, borderline personality disorder along with generalized convulsive epilepsy, polysubstance use and ADHD.   Patient seen and reassessed this morning, writer introduced self in which patient and knowledge entry into the room by looking up at this nurse practitioner.  He then laid his head back down, and did not engage in any further.  Despite multiple prompts, initial engagement questions, and recap of previous psych evaluation patient did not choose to engage.  Patient did state" I do not want to talk about it."  Patient has told several providers about purchasing a gun with an intent to end his life, due to increased stressors of his best friend overdosing on June 16.  Patient has history of fabricating stories about close friends and relatives death by suicide.  It remains unclear if his friend actually overdosed of this is a another fabrication.Patient will not answer questions regarding suicidality at this time.  Due to patient's depressed and dysphoric mood, flat affect will continue to recommend inpatient hospitalization.  Will continue psychiatric hospitalization recommendation due to high probability of danger if discharged with imminent rehospitalization likely.  Will need to obtain collateral and or verify if patient was able to purchase a gun, in order to harm himself.  Per nursing staff patient has remained in bed throughout the day, seemingly compliant with current orders and diet.  They note no dangerous behaviors.     Patient has chronic history of depression, and presents as such.  Furthermore he has a history of refusal to engage in his treatment care, generally uncooperative  and disruptive during medical and psychiatric admissions.  This continues to be concerning for antisocial and or borderline personality traits, yet patient has type 1 diabetes that is untreated can result in  severe medical complications.  Patient is currently homeless and has limited access to resources (i.e. banned, refusal, or ineffective), despite being provided resources for shelters, substance abuse residential programs, self referral to [facility], community refrigerators (insulin storage), and food pantries.  While patient appears to have appropriate capacity to refuse care and not engage with providers, will begin to question his overall competency and inability to care for self.  Attempted to obtain collateral from his significant other Edsel Petrin, however unsuccessful at this time.    Past Psychiatric History: Bipolar 1 disorder, borderline personality disorder, polysubstance use (methamphetamine and opiate) Previous psychiatric medication trials: Zoloft Tegretol Haldol Seroquel and Zyprexa Past suicidal attempt: Reports putting a gun to head " gun jammed", attempt to hang himself, attempt to jump off bridge Current psychiatric provider: [facility]   Risk to Self: Risk to Others:   Prior Inpatient Therapy: Last inpatient hospitalization Nov 23, 2021 at [facility] (3-day length of stay), [facility] April 2023 Prior Outpatient Therapy:    Past Medical History:  Past Medical History:  Diagnosis Date   ADD (attention deficit disorder)    Allergy    Anxiety    Asthma    Depression    DKA (diabetic ketoacidosis) (HCC) 11/10/2020   Epileptic seizure (HCC)    "both petit and grand mal; last sz ~ 2 wk ago" (06/17/2013)   Fracture of multiple ribs 05/11/2020   Headache(784.0)    "2-3 times/wk usually" (06/17/2013)   Heart murmur    "heard a slight one earlier today" (06/17/2013)   Homeless    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   Migraine    "maybe once/month; it's severe" (06/17/2013)   ODD (oppositional defiant disorder)    Partial small bowel obstruction (HCC) 11/11/2021   SBO (small bowel obstruction) (HCC) 11/11/2021   Sickle cell  trait (HCC)    Type I diabetes mellitus (HCC)    "that's what they're thinking now" (06/17/2013)   Vision abnormalities    "takes him longer to focus cause; from his sz" (06/17/2013)    Past Surgical History:  Procedure Laterality Date   CIRCUMCISION  2000   FINGER SURGERY Left 2001   "crushed pinky; had to repair it" (06/17/2013)   Family History:  Family History  Problem Relation Age of Onset   Asthma Mother    COPD Mother    Other Mother        possible autoimmune, unclear   Migraines Sister        Hemiplegic Migraines    Diabetes Maternal Grandmother    Heart disease Maternal Grandmother    Hypertension Maternal Grandmother    Mental illness Maternal Grandmother    Heart disease Maternal Grandfather    Hyperlipidemia Paternal Grandmother    Hyperlipidemia Paternal Grandfather    Family Psychiatric  History:  Social History:  Social History   Substance and Sexual Activity  Alcohol Use No   Alcohol/week: 0.0 standard drinks of alcohol     Social History   Substance and Sexual Activity  Drug Use Yes   Types: Methamphetamines   Comment: 10- 14 times a week    Social History   Socioeconomic History   Marital status: Married    Spouse name: Not on file   Number of children: 1  Years of education: Not on file   Highest education level: Not on file  Occupational History   Occupation: Unemployed  Tobacco Use   Smoking status: Every Day    Packs/day: 0.50    Types: Cigarettes    Last attempt to quit: 06/2020    Years since quitting: 1.4   Smokeless tobacco: Never   Tobacco comments:    Mom and dad smoke outside   Vaping Use   Vaping Use: Former  Substance and Sexual Activity   Alcohol use: No    Alcohol/week: 0.0 standard drinks of alcohol   Drug use: Yes    Types: Methamphetamines    Comment: 10- 14 times a week   Sexual activity: Yes  Other Topics Concern   Not on file  Social History Narrative    followed by [facility]   Social Determinants of  Health   Financial Resource Strain: Not on file  Food Insecurity: Not on file  Transportation Needs: Not on file  Physical Activity: Not on file  Stress: Not on file  Social Connections: Not on file   Additional Social History:    Allergies:   Allergies  Allergen Reactions   Bee Venom Anaphylaxis   Fish Allergy Anaphylaxis   Shellfish Allergy Anaphylaxis   Tea Anaphylaxis    Labs:  Results for orders placed or performed during the hospital encounter of 12/14/21 (from the past 48 hour(s))  Glucose, capillary     Status: Abnormal   Collection Time: 12/16/21  3:44 PM  Result Value Ref Range   Glucose-Capillary 271 (H) 70 - 99 mg/dL    Comment: Glucose reference range applies only to samples taken after fasting for at least 8 hours.  Glucose, capillary     Status: Abnormal   Collection Time: 12/16/21  8:18 PM  Result Value Ref Range   Glucose-Capillary 232 (H) 70 - 99 mg/dL    Comment: Glucose reference range applies only to samples taken after fasting for at least 8 hours.  Glucose, capillary     Status: Abnormal   Collection Time: 12/16/21 11:25 PM  Result Value Ref Range   Glucose-Capillary 368 (H) 70 - 99 mg/dL    Comment: Glucose reference range applies only to samples taken after fasting for at least 8 hours.   Comment 1 Notify RN    Comment 2 Document in Chart   Glucose, capillary     Status: Abnormal   Collection Time: 12/17/21  1:35 AM  Result Value Ref Range   Glucose-Capillary 297 (H) 70 - 99 mg/dL    Comment: Glucose reference range applies only to samples taken after fasting for at least 8 hours.   Comment 1 Notify RN    Comment 2 Document in Chart   Basic metabolic panel     Status: Abnormal   Collection Time: 12/17/21  4:37 AM  Result Value Ref Range   Sodium 139 135 - 145 mmol/L   Potassium 4.0 3.5 - 5.1 mmol/L   Chloride 107 98 - 111 mmol/L   CO2 26 22 - 32 mmol/L   Glucose, Bld 232 (H) 70 - 99 mg/dL    Comment: Glucose reference range applies only  to samples taken after fasting for at least 8 hours.   BUN 14 6 - 20 mg/dL   Creatinine, Ser 3.15 (L) 0.61 - 1.24 mg/dL   Calcium 8.8 (L) 8.9 - 10.3 mg/dL   GFR, Estimated >40 >08 mL/min    Comment: (NOTE) Calculated using the  CKD-EPI Creatinine Equation (2021)    Anion gap 6 5 - 15    Comment: Performed at [facility], [address], [place], [place] [address]  Glucose, capillary     Status: Abnormal   Collection Time: 12/17/21  5:06 AM  Result Value Ref Range   Glucose-Capillary 232 (H) 70 - 99 mg/dL    Comment: Glucose reference range applies only to samples taken after fasting for at least 8 hours.   Comment 1 Notify RN    Comment 2 Document in Chart   Glucose, capillary     Status: Abnormal   Collection Time: 12/17/21  7:33 AM  Result Value Ref Range   Glucose-Capillary 204 (H) 70 - 99 mg/dL    Comment: Glucose reference range applies only to samples taken after fasting for at least 8 hours.  Glucose, capillary     Status: Abnormal   Collection Time: 12/17/21 11:48 AM  Result Value Ref Range   Glucose-Capillary 118 (H) 70 - 99 mg/dL    Comment: Glucose reference range applies only to samples taken after fasting for at least 8 hours.  Glucose, capillary     Status: Abnormal   Collection Time: 12/17/21  5:06 PM  Result Value Ref Range   Glucose-Capillary 554 (HH) 70 - 99 mg/dL    Comment: Glucose reference range applies only to samples taken after fasting for at least 8 hours.   Comment 1 Notify RN   Glucose, capillary     Status: Abnormal   Collection Time: 12/17/21  9:14 PM  Result Value Ref Range   Glucose-Capillary 237 (H) 70 - 99 mg/dL    Comment: Glucose reference range applies only to samples taken after fasting for at least 8 hours.   Comment 1 Notify RN    Comment 2 Document in Chart   Basic metabolic panel     Status: Abnormal   Collection Time: 12/18/21  5:00 AM  Result Value Ref Range   Sodium 137 135 - 145 mmol/L   Potassium 3.8 3.5 -  5.1 mmol/L   Chloride 104 98 - 111 mmol/L   CO2 24 22 - 32 mmol/L   Glucose, Bld 227 (H) 70 - 99 mg/dL    Comment: Glucose reference range applies only to samples taken after fasting for at least 8 hours.   BUN 15 6 - 20 mg/dL   Creatinine, Ser 6.21 (L) 0.61 - 1.24 mg/dL   Calcium 8.6 (L) 8.9 - 10.3 mg/dL   GFR, Estimated >30 >86 mL/min    Comment: (NOTE) Calculated using the CKD-EPI Creatinine Equation (2021)    Anion gap 9 5 - 15    Comment: Performed at [facility], [address], [place], [place] [address]  Hepatic function panel     Status: Abnormal   Collection Time: 12/18/21  5:00 AM  Result Value Ref Range   Total Protein 6.9 6.5 - 8.1 g/dL   Albumin 3.5 3.5 - 5.0 g/dL   AST 962 (H) 15 - 41 U/L   ALT 191 (H) 0 - 44 U/L   Alkaline Phosphatase 97 38 - 126 U/L   Total Bilirubin 0.7 0.3 - 1.2 mg/dL   Bilirubin, Direct <9.5 0.0 - 0.2 mg/dL   Indirect Bilirubin NOT CALCULATED 0.3 - 0.9 mg/dL    Comment: Performed at [facility], [address] [place], [place] [address]  Glucose, capillary     Status: Abnormal   Collection Time: 12/18/21  7:42 AM  Result Value Ref Range   Glucose-Capillary 175 (H) 70 - 99 mg/dL    Comment: Glucose reference range applies only to samples taken after fasting for at least 8 hours.  Glucose, capillary     Status: Abnormal   Collection Time: 12/18/21 11:44 AM  Result Value Ref Range   Glucose-Capillary 124 (H) 70 - 99 mg/dL    Comment: Glucose reference range applies only to samples taken after fasting for at least 8 hours.    Current Facility-Administered Medications  Medication Dose Route Frequency Provider Last Rate Last Admin   dextrose 50 % solution 0-50 mL  0-50 mL Intravenous PRN Earlie Lou L, MD       enoxaparin (LOVENOX) injection 40 mg  40 mg Subcutaneous Q24H Earlie Lou L, MD   40 mg at 12/16/21 2153   FLUoxetine (PROZAC) capsule 10 mg  10 mg Oral Daily Earlie Lou L, MD   10 mg  at 12/18/21 0959   haloperidol (HALDOL) tablet 5 mg  5 mg Oral Once Jimmye Norman, NP       insulin aspart (novoLOG) injection 0-15 Units  0-15 Units Subcutaneous TID WC Kc, Dayna Barker, MD   2 Units at 12/18/21 1219   insulin aspart (novoLOG) injection 10 Units  10 Units Subcutaneous TID WC Kc, Dayna Barker, MD   10 Units at 12/18/21 1220   insulin glargine-yfgn (SEMGLEE) injection 40 Units  40 Units Subcutaneous BID Lanae Boast, MD   40 Units at 12/18/21 0959   lisinopril (ZESTRIL) tablet 10 mg  10 mg Oral Daily Earlie Lou L, MD   10 mg at 12/18/21 5956   Oral care mouth rinse  15 mL Mouth Rinse PRN Earlie Lou L, MD       oxyCODONE (Oxy IR/ROXICODONE) immediate release tablet 5 mg  5 mg Oral Q6H PRN [name] Emery, MD       QUEtiapine (SEROQUEL) tablet 75 mg  75 mg Oral QHS Earlie Lou L, MD   75 mg at 12/17/21 2134   sertraline (ZOLOFT) tablet 100 mg  100 mg Oral Daily Earlie Lou L, MD   100 mg at 12/18/21 0959   tamsulosin (FLOMAX) capsule 0.4 mg  0.4 mg Oral Daily [name] Emery, MD   0.4 mg at 12/18/21 3875    Musculoskeletal: Strength & Muscle Tone: within normal limits Gait & Station:  not assessed Patient leans: N/A            Psychiatric Specialty Exam:  Deferred due to patient refusal to participate  Physical Exam: Physical Exam Review of Systems  Constitutional:  Positive for malaise/fatigue. Negative for fever.  HENT: Negative.  Negative for congestion, ear discharge, hearing loss and sore throat.   Eyes: Negative.  Negative for discharge and redness.  Respiratory:  Negative for cough, shortness of breath and wheezing.   Cardiovascular:  Negative for chest pain and palpitations.  Psychiatric/Behavioral:  Positive for depression and suicidal ideas.    Blood pressure 113/68, pulse 73, temperature 98 F (36.7 C), temperature source Oral, resp. rate 16, height 5' 10.5" (1.791 m), weight 74.1 kg, SpO2 100 %. Body mass index is 23.11  kg/m.  Treatment Plan Summary: Daily contact with patient to assess and evaluate symptoms and progress in treatment and Plan Patient needs inpatient psychiatric care when medically stable Continue 1 :1 sitter for suicidal ideation. Continue IVC at this time. Continue home medication -TOC consult for APS referral due to self-neglect, inability to care for self,  multiple hospitalizations. Patient with chronic medical disability i.e. type 1 diabetes, bipolar 1 disorder, and borderline personality disorder; with self neglect, will need assistance with initiation of the [facility] services on the behalf of the patient.  Disposition: Recommend psychiatric Inpatient admission when medically cleared. Discussed crisis plan, support from social network, calling 911, coming to the Emergency Department, and calling Suicide Hotline. Continue 1 :1 sitter for suicidal ideation  Maryagnes Amos, FNP 12/18/2021 12:23 PM

## 2021-12-18 NOTE — Progress Notes (Signed)
PROGRESS NOTE  [name] [name]. SNK:[id number] DOB: 2001-05-15   PCP: [facility]  Patient is from: Home  DOA: 12/14/2021 LOS: 4  Chief complaints Chief Complaint  Patient presents with   Suicidal     Brief Narrative / Interim history: 21 year old M with PMH of DM-1, epilepsy, polysubstance use, hep C, chron's disease, asthma, previous bowel obstruction, multiple psych history including suicidal ideation and noncompliance presenting to behavioral outpatient clinic voluntarily with suicidal ideation.  Reportedly, he witnessed his best friend of 15 years dying from overdose on 6/16, and his mother committing suicide several months ago.  He purchased a gun but decided he would have 1 last time to try to get help.  In ED, hyperglycemic to 724.  Not in DKA.  He was started on insulin drip.  Blood sugar stabilized, and he was transitioned to subcu insulin.  Psychiatry recommends inpatient psych admission.  Subjective: Seen and examined earlier this morning.  No major events overnight of this morning.  He went back to sleep after he ate his breakfast.  He wakes to voice.  No complaints.  He was calm and appropriate per Recruitment consultant.  Objective: Vitals:   12/17/21 2129 12/18/21 0204 12/18/21 0608 12/18/21 1422  BP: 131/84 (!) 96/55 113/68 129/77  Pulse: 93 72 73 100  Resp: 18 16 16 17   Temp: 98.4 F (36.9 C) 98 F (36.7 C) 98 F (36.7 C) 97.9 F (36.6 C)  TempSrc: Oral Oral Oral Oral  SpO2: 100% 99% 100% 99%  Weight:      Height:        Examination:  GENERAL: No apparent distress.  Nontoxic. HEENT: MMM.  Vision and hearing grossly intact.  NECK: Supple.  No apparent JVD.  RESP:  No IWOB.  Fair aeration bilaterally. CVS:  RRR. Heart sounds normal.  ABD/GI/GU: BS+. Abd soft, NTND.  MSK/EXT:  Moves extremities. No apparent deformity. No edema.  SKIN: no apparent skin lesion or wound NEURO: Sleepy but wakes to voice.  No apparent focal neuro  deficit. PSYCH: Calm. Normal affect.   Procedures:  None  Microbiology summarized: MRSA PCR screen negative.  Assessment and plan: Principal Problem:   Hyperosmolar hyperglycemic state (HHS) (HCC) Active Problems:   Uncontrolled type 1 diabetes mellitus with hyperglycemia, with long-term current use of insulin (HCC)   IVDU (intravenous drug user)   Attention deficit hyperactivity disorder (ADHD), combined type, moderate   Noncompliance   Schizoaffective disorder (HCC)   Hep C w/o coma, chronic (HCC)   Hyperglycemia due to type 1 diabetes mellitus (HCC)   Tobacco abuse   Conversion disorder   Hypokalemia   Uncontrolled DM-1 with hyperglycemia: A1c 9.8%.  Blood glucose was elevated to 724 on arrival.  HHS and DKA ruled out.  On NovoLog 10 units  tid, Lantus 40 units bid.  Hyperglycemia likely due to noncompliance. Recent Labs  Lab 12/17/21 1706 12/17/21 2114 12/18/21 0742 12/18/21 1144 12/18/21 1551  GLUCAP 554* 237* 175* 124* 322*  -Increase SSI to resistant to starting tomorrow -Continue NovoLog 10 units 3 times daily with meals -Increase semaglutide from 40 units twice daily to 45 units twice daily -Further adjustment as appropriate.  Bipolar 1 disorder, BPD polysubstance use, conversion disorder, SI Self-neglect/inability to care for self/multiple hospitalization/noncompliance -Per psychiatry. -Continue one-to-one safety sitter -Continue IVC -Continue home meds-Seroquel.  Not sure why he is on Prozac and Zoloft at the same time.  -TOC consult for hep C referral due to self-neglect, inability  to care for self and multiple hospitalization  Essential hypertension: Normotensive off home antihypertensive meds.   Abnormal LFTs: Likely due to acute hepatitis.  Quantitative HCV 3.98m Recent Labs  Lab 12/14/21 1636 12/15/21 0818 12/18/21 0500  AST 129* 111* 172*  ALT 166* 130* 191*  ALKPHOS 143* 81 97  BILITOT 1.1 0.5 0.7  PROT 7.8 6.3* 6.9  ALBUMIN 4.1 3.4* 3.5   -Monitor intermittently  History of hepatitis C  -needs outpatient follow-up  Tobacco abuse -Encourage cessation.  Hypokalemia: Resolved   Body mass index is 23.11 kg/m.           DVT prophylaxis:  enoxaparin (LOVENOX) injection 40 mg Start: 12/14/21 2200  Code Status: Full code Family Communication: None Level of care: Med-Surg Status is: Inpatient Remains inpatient appropriate because: Need for inpatient psychiatric hospitalization   Final disposition: Inpatient psychiatric hospital Consultants:  Psychiatry  Sch Meds:  Scheduled Meds:  enoxaparin (LOVENOX) injection  40 mg Subcutaneous Q24H   FLUoxetine  10 mg Oral Daily   haloperidol  5 mg Oral Once   insulin aspart  0-15 Units Subcutaneous TID WC   insulin aspart  10 Units Subcutaneous TID WC   insulin glargine-yfgn  40 Units Subcutaneous BID   lisinopril  10 mg Oral Daily   QUEtiapine  75 mg Oral QHS   sertraline  100 mg Oral Daily   tamsulosin  0.4 mg Oral Daily   Continuous Infusions: PRN Meds:.dextrose, mouth rinse, oxyCODONE  Antimicrobials: Anti-infectives (From admission, onward)    None        I have personally reviewed the following labs and images: CBC: Recent Labs  Lab 12/14/21 1636 12/14/21 2026  WBC 6.8 6.2  HGB 14.6 13.8  HCT 42.3 39.3  MCV 80.0 79.2*  PLT 277 265   BMP &GFR Recent Labs  Lab 12/15/21 0818 12/15/21 1445 12/16/21 0031 12/16/21 0658 12/17/21 0437 12/18/21 0500  NA 137  --  141 142 139 137  K 3.0* 4.5 3.6 3.4* 4.0 3.8  CL 105  --  108 112* 107 104  CO2 24  --  25 25 26 24   GLUCOSE 133*  --  272* 111* 232* 227*  BUN 8  --  14 11 14 15   CREATININE 0.39*  --  0.52* 0.43* 0.52* 0.42*  CALCIUM 8.5*  --  8.2* 8.2* 8.8* 8.6*   Estimated Creatinine Clearance: 151.8 mL/min (A) (by C-G formula based on SCr of 0.42 mg/dL (L)). Liver & Pancreas: Recent Labs  Lab 12/14/21 1636 12/15/21 0818 12/18/21 0500  AST 129* 111* 172*  ALT 166* 130* 191*  ALKPHOS  143* 81 97  BILITOT 1.1 0.5 0.7  PROT 7.8 6.3* 6.9  ALBUMIN 4.1 3.4* 3.5   Recent Labs  Lab 12/14/21 2026  LIPASE 33   No results for input(s): "AMMONIA" in the last 168 hours. Diabetic: No results for input(s): "HGBA1C" in the last 72 hours. Recent Labs  Lab 12/17/21 1706 12/17/21 2114 12/18/21 0742 12/18/21 1144 12/18/21 1551  GLUCAP 554* 237* 175* 124* 322*   Cardiac Enzymes: No results for input(s): "CKTOTAL", "CKMB", "CKMBINDEX", "TROPONINI" in the last 168 hours. No results for input(s): "PROBNP" in the last 8760 hours. Coagulation Profile: No results for input(s): "INR", "PROTIME" in the last 168 hours. Thyroid Function Tests: No results for input(s): "TSH", "T4TOTAL", "FREET4", "T3FREE", "THYROIDAB" in the last 72 hours. Lipid Profile: No results for input(s): "CHOL", "HDL", "LDLCALC", "TRIG", "CHOLHDL", "LDLDIRECT" in the last 72 hours. Anemia Panel: No  results for input(s): "VITAMINB12", "FOLATE", "FERRITIN", "TIBC", "IRON", "RETICCTPCT" in the last 72 hours. Urine analysis:    Component Value Date/Time   COLORURINE COLORLESS (A) 12/14/2021 1634   APPEARANCEUR CLEAR 12/14/2021 1634   LABSPEC 1.020 12/14/2021 1634   PHURINE 8.0 12/14/2021 1634   GLUCOSEU >=500 (A) 12/14/2021 1634   HGBUR NEGATIVE 12/14/2021 1634   BILIRUBINUR NEGATIVE 12/14/2021 1634   KETONESUR 5 (A) 12/14/2021 1634   PROTEINUR NEGATIVE 12/14/2021 1634   UROBILINOGEN 0.2 09/18/2019 1339   NITRITE NEGATIVE 12/14/2021 1634   LEUKOCYTESUR NEGATIVE 12/14/2021 1634   Sepsis Labs: Invalid input(s): "PROCALCITONIN", "LACTICIDVEN"  Microbiology: Recent Results (from the past 240 hour(s))  MRSA Next Gen by PCR, Nasal     Status: None   Collection Time: 12/14/21 11:21 PM   Specimen: Nasal Mucosa; Nasal Swab  Result Value Ref Range Status   MRSA by PCR Next Gen NOT DETECTED NOT DETECTED Final    Comment: (NOTE) The GeneXpert MRSA Assay (FDA approved for NASAL specimens only), is one  component of a comprehensive MRSA colonization surveillance program. It is not intended to diagnose MRSA infection nor to guide or monitor treatment for MRSA infections. Test performance is not FDA approved in patients less than 9 years old. Performed at [facility], [address] [place], [place] [address]     Radiology Studies: No results found.    Davyd Podgorski T. Mashell Sieben [facility]  If 7PM-7AM, please contact night-coverage [email or web address] 12/18/2021, 5:00 PM

## 2021-12-18 NOTE — TOC Progression Note (Addendum)
Transition of Care (TOC) - Progression Note    Patient Details  Name: [name] [name]. MRN: [id number] Date of Birth: 11/24/2000  Transition of [facility]) CM/SW Contact  Amada Jupiter, LCSW Phone Number: 12/18/2021, 2:29 PM  Clinical Narrative:    TOC continues to search for IP psychiatric bed and have re-faxed to all area facilities again today with updated progress notes.    Per Psychiatry service request, have placed a referral to [facility] APS Edsel Petrin @ [phone]):   "[facility] consult for APS referral due to self-neglect, inability to care for self, multiple hospitalizations. Patient with chronic medical disability i.e. type 1 diabetes, bipolar 1 disorder, and borderline personality disorder; with self neglect, will need assistance with initiation of the [facility] services on the behalf of the patient." Have requested that APS contact me to confirm if referral accepted or declined.  At this time, pt remains here under IVC (renewal will be needed on 12/21/21 if Psychiatry deems appropriate).  TOC will continue to search for IP Psychiatric placement.  ADDENDUM: APS has confirmed that they have accepted pt's case.  Expected Discharge Plan: Homeless Shelter Barriers to Discharge: No Barriers Identified  Expected Discharge Plan and Services Expected Discharge Plan: Homeless Shelter       Living arrangements for the past 2 months: Homeless                                       Social Determinants of Health (SDOH) Interventions    Readmission Risk Interventions    11/07/2021    3:11 PM  Readmission Risk Prevention Plan  Medication Review (RN Care Manager) Complete  HRI or Home Care Consult Complete  SW Recovery Care/Counseling Consult Complete  Palliative Care Screening Not Applicable  Skilled Nursing Facility Not Applicable

## 2021-12-18 NOTE — Plan of Care (Signed)

## 2021-12-19 DIAGNOSIS — E1065 Type 1 diabetes mellitus with hyperglycemia: Secondary | ICD-10-CM | POA: Diagnosis not present

## 2021-12-19 DIAGNOSIS — E1100 Type 2 diabetes mellitus with hyperosmolarity without nonketotic hyperglycemic-hyperosmolar coma (NKHHC): Secondary | ICD-10-CM | POA: Diagnosis not present

## 2021-12-19 DIAGNOSIS — R748 Abnormal levels of other serum enzymes: Secondary | ICD-10-CM | POA: Diagnosis not present

## 2021-12-19 DIAGNOSIS — F902 Attention-deficit hyperactivity disorder, combined type: Secondary | ICD-10-CM | POA: Diagnosis not present

## 2021-12-19 LAB — BASIC METABOLIC PANEL
Anion gap: 8 (ref 5–15)
BUN: 19 mg/dL (ref 6–20)
CO2: 23 mmol/L (ref 22–32)
Calcium: 9.3 mg/dL (ref 8.9–10.3)
Chloride: 108 mmol/L (ref 98–111)
Creatinine, Ser: 0.48 mg/dL — ABNORMAL LOW (ref 0.61–1.24)
GFR, Estimated: >60 mL/min (ref >60)
Glucose, Bld: 155 mg/dL — ABNORMAL HIGH (ref 70–99)
Potassium: 3.9 mmol/L (ref 3.5–5.1)
Sodium: 139 mmol/L (ref 135–145)

## 2021-12-19 LAB — SARS CORONAVIRUS 2 BY RT PCR: SARS Coronavirus 2 by RT PCR: NEGATIVE

## 2021-12-19 LAB — GLUCOSE, CAPILLARY: Glucose-Capillary: 144 mg/dL — ABNORMAL HIGH (ref 70–99)

## 2021-12-19 MED ORDER — INSULIN ASPART 100 UNIT/ML IJ SOLN: 0.0000 [IU] | Freq: Three times a day (TID) | INTRAMUSCULAR | 11 refills | Status: DC

## 2021-12-19 NOTE — TOC Transition Note (Signed)
Transition of Care [facility]) - CM/SW Discharge Note   Patient Details  Name: [name] [name]. MRN: [id number] Date of Birth: [birth year]  Transition of Care [facility]) CM/SW Contact:  Amada Jupiter, LCSW Phone Number: 12/19/2021, 10:56 AM   Clinical Narrative:     Have received IP Psychiatric bed offer from [name]) and pt is medically cleared for dc there today.  Receiving MD is Dr. [name] Mellow, MD.  Pt informed and in agreement but remains under IVC.  [place] [facility] called with anticipation they will be here in .  RN to call report to [phone].  No further TOC needs.  Final next level of care: Psychiatric Hospital Barriers to Discharge: Barriers Resolved   Patient Goals and CMS Choice Patient states their goals for this hospitalization and ongoing recovery are:: to get better CMS Medicare.gov Compare Post Acute Care list provided to:: Patient Choice offered to / list presented to : Patient  Discharge Placement                Patient to be transferred to facility by: Loann Quill. Sherriff   Patient and family notified of of transfer: 12/19/21  Discharge Plan and Services                                     Social Determinants of Health (SDOH) Interventions     Readmission Risk Interventions    11/07/2021    3:11 PM  Readmission Risk Prevention Plan  Medication Review (RN Care Manager) Complete  HRI or Home Care Consult Complete  SW Recovery Care/Counseling Consult Complete  Palliative Care Screening Not Applicable  Skilled Nursing Facility Not Applicable

## 2021-12-19 NOTE — Progress Notes (Signed)
Attempted to call report to [facility] [phone] two times no one available to get report at this time, RN will continue to call.

## 2021-12-19 NOTE — Discharge Summary (Signed)
Physician Discharge Summary  [name] [name]. GNO:[id number] DOB: 2001/05/17 DOA: 12/14/2021  PCP: [facility] [place] date: 12/14/2021 Discharge date: 12/19/2021 Admitted From: Home Disposition:  Inpatient psychiatric hospital Recommendations for Outpatient Follow-up:  Follow ups as below. Please obtain cbc and cmp in one week Please follow up on the following pending results: none   Discharge Condition: stable CODE STATUS: Full code   Hospital course 21 year old M with PMH of DM-1, epilepsy, polysubstance use, hep C, chron's disease, asthma, previous bowel obstruction, multiple psych history including suicidal ideation and noncompliance presenting to behavioral outpatient clinic voluntarily with suicidal ideation.  Reportedly, he witnessed his best friend of 15 years dying from overdose on 6/16, and his mother committing suicide several months ago.  He purchased a gun but decided he would have 1 last time to try to get help.  In ED, hyperglycemic to 724.  Not in DKA.  He was started on insulin drip.  Blood sugar stabilized, and he was transitioned to subcu insulin.  Psychiatry recommends inpatient psych admission.  He is accepted at [facility] for inpatient psychiatric hospitalization.  See individual problem list below for more.   Problems addressed during this hospitalization Principal Problem:   Hyperosmolar hyperglycemic state (HHS) ([place]) Active Problems:   Uncontrolled type 1 diabetes mellitus with hyperglycemia, with long-term current use of insulin (HCC)   IVDU (intravenous drug user)   Attention deficit hyperactivity disorder (ADHD), combined type, moderate   Noncompliance   Elevated liver enzymes   Schizoaffective disorder (HCC)   Hep C w/o coma, chronic (HCC)   Hyperglycemia due to type 1 diabetes mellitus ([place])   Tobacco abuse   Conversion disorder   Hypokalemia    Uncontrolled DM-1 with hyperglycemia: A1c 9.8%.  Blood glucose  was elevated to 724 on arrival.  HHS and DKA ruled out.  On NovoLog 8 units  tid, Lantus 40 units bid.  Hyperglycemia likely due to noncompliance. Recent Labs  Lab 12/18/21 0742 12/18/21 1144 12/18/21 1551 12/18/21 2139 12/19/21 0734  GLUCAP 175* 124* 322* 103* 144*  -Continue Lantus 35 units twice daily -Continue NovoLog 8 units 3 times daily before meals -Continue SSI-resistant scale -Further adjustment as appropriate.   Bipolar 1 disorder, BPD polysubstance use, conversion disorder, SI Self-neglect/inability to care for self/multiple hospitalization/noncompliance -Per psychiatry. -Continue one-to-one safety sitter -Continue IVC -Continue home meds-Seroquel, Prozac and Zoloft per psychiatry.  -Not taking Tegretol.  Discontinued.   Essential hypertension: Normotensive  -Continue lisinopril 10 mg daily.   Abnormal LFTs/hepatitis C infection: Likely due to acute hepatitis.  Quantitative HCV 3.47m-Needs outpatient follow-up with ID   Tobacco abuse -Encourage cessation.   Hypokalemia: Resolved    Vital signs Vitals:   12/18/21 0608 12/18/21 1422 12/18/21 2142 12/19/21 0545  BP: 113/68 129/77 118/72 96/66  Pulse: 73 100 89 72  Temp: 98 F (36.7 C) 97.9 F (36.6 C) 98.5 F (36.9 C) 98.5 F (36.9 C)  Resp: _0 Height:      Weight:      SpO2: 100% 99% 99% 100%  TempSrc: Oral Oral Oral Oral  BMI (Calculated):         Discharge exam  GENERAL: No apparent distress.  Nontoxic. HEENT: MMM.  Vision and hearing grossly intact.  NECK: Supple.  No apparent JVD.  RESP:  No IWOB.  Fair aeration bilaterally. CVS:  RRR. Heart sounds normal.  ABD/GI/GU: BS+. Abd soft, NTND.  MSK/EXT:  Moves extremities. No  apparent deformity. No edema.  SKIN: no apparent skin lesion or wound NEURO: Awake and alert. Oriented appropriately.  No apparent focal neuro deficit. PSYCH: Calm. Normal affect.   Discharge Instructions Discharge Instructions     Diet - low sodium heart  healthy   Complete by: As directed    Diet Carb Modified   Complete by: As directed    Increase activity slowly   Complete by: As directed       Allergies as of 12/19/2021       Reactions   Bee Venom Anaphylaxis   Fish Allergy Anaphylaxis   Shellfish Allergy Anaphylaxis   Tea Anaphylaxis        Medication List     STOP taking these medications    carbamazepine 200 MG tablet Commonly known as: TEGRETOL   haloperidol 5 MG tablet Commonly known as: HALDOL   oxyCODONE 5 MG immediate release tablet Commonly known as: Oxy IR/ROXICODONE       TAKE these medications    Accu-Chek Guide test strip Generic drug: glucose blood Use to test blood sugars in the morning and bed time.   Accu-Chek Guide w/Device Kit Use as directed   Accu-Chek Softclix Lancets lancets Use to test blood sugars in the morning and at bed time.   blood glucose meter kit and supplies Kit Dispense based on patient and insurance preference. Use up to four times daily as directed. (FOR ICD-9 250.00, 250.01).   EPINEPHrine 0.3 mg/0.3 mL Soaj injection Commonly known as: EPI-PEN Inject 0.3 mg into the muscle once as needed for anaphylaxis (severe allergic reaction).   FLUoxetine 10 MG capsule Commonly known as: PROZAC Take 1 capsule (10 mg total) by mouth daily.   insulin glargine 100 UNIT/ML Solostar Pen Commonly known as: LANTUS Inject 35 Units into the skin in the morning and at bedtime. What changed: how much to take   Insulin Pen Needle 32G X 4 MM Misc Use as directed   Insulin Pen Needle 32G X 4 MM Misc Use to inject insulin 3 times daily as directed.   lisinopril 10 MG tablet Commonly known as: ZESTRIL Take 1 tablet (10 mg total) by mouth daily. What changed: Another medication with the same name was removed. Continue taking this medication, and follow the directions you see here.   NovoLOG FlexPen 100 UNIT/ML FlexPen Generic drug: insulin aspart Inject 8 Units into the skin 3  (three) times daily with meals. What changed:  how much to take when to take this   insulin aspart 100 UNIT/ML injection Commonly known as: novoLOG Inject 0-20 Units into the skin 3 (three) times daily with meals. What changed: You were already taking a medication with the same name, and this prescription was added. Make sure you understand how and when to take each.   ondansetron 4 MG tablet Commonly known as: ZOFRAN Take 4 mg by mouth every 8 (eight) hours as needed for nausea or vomiting.   QUEtiapine 25 MG tablet Commonly known as: SEROQUEL Take 75 mg by mouth at bedtime.   sertraline 100 MG tablet Commonly known as: ZOLOFT Take 100 mg by mouth daily.   tamsulosin 0.4 MG Caps capsule Commonly known as: FLOMAX Take 0.4 mg by mouth daily.   VITAMIN B12 PO Take 1 tablet by mouth daily.        Consultations: Psychiatry  Procedures/Studies   US Abdomen Limited  Result Date: 12/06/2021 CLINICAL DATA:  Right upper quadrant pain EXAM: ULTRASOUND ABDOMEN LIMITED RIGHT UPPER QUADRANT  COMPARISON:  11/11/2021 FINDINGS: Gallbladder: No gallstones or wall thickening visualized. No sonographic Murphy sign noted by sonographer. Common bile duct: Diameter: 3 mm Liver: Mildly increased in echogenicity without focal mass. Portal vein is patent on color Doppler imaging with normal direction of blood flow towards the liver. Other: None. IMPRESSION: Fatty infiltration of the liver. No other focal abnormality is seen. Electronically Signed   By: Inez Catalina M.D.   On: 12/06/2021 01:24   DG Chest Port 1 View  Result Date: 11/30/2021 CLINICAL DATA:  Chest pain. EXAM: PORTABLE CHEST 1 VIEW COMPARISON:  PA Lat 11/11/2021 FINDINGS: The heart size and mediastinal contours are within normal limits. Both lungs are clear. The visualized skeletal structures are unremarkable. There are multiple overlying monitor wires. IMPRESSION: No active disease.  Stable chest. Electronically Signed   By: Telford Nab M.D.   On: 11/30/2021 04:05       The results of significant diagnostics from this hospitalization (including imaging, microbiology, ancillary and laboratory) are listed below for reference.     Microbiology: Recent Results (from the past 240 hour(s))  MRSA Next Gen by PCR, Nasal     Status: None   Collection Time: 12/14/21 11:21 PM   Specimen: Nasal Mucosa; Nasal Swab  Result Value Ref Range Status   MRSA by PCR Next Gen NOT DETECTED NOT DETECTED Final    Comment: (NOTE) The GeneXpert MRSA Assay (FDA approved for NASAL specimens only), is one component of a comprehensive MRSA colonization surveillance program. It is not intended to diagnose MRSA infection nor to guide or monitor treatment for MRSA infections. Test performance is not FDA approved in patients less than 51 years old. Performed at [facility], [place] [address] [place], [place] [address]   SARS Coronavirus 2 by RT PCR (hospital order, performed in [facility] hospital lab) *cepheid single result test* Anterior Nasal Swab     Status: None   Collection Time: 12/19/21  8:57 AM   Specimen: Anterior Nasal Swab  Result Value Ref Range Status   SARS Coronavirus 2 by RT PCR NEGATIVE NEGATIVE Final    Comment: (NOTE) SARS-CoV-2 target nucleic acids are NOT DETECTED.  The SARS-CoV-2 RNA is generally detectable in upper and lower respiratory specimens during the acute phase of infection. The lowest concentration of SARS-CoV-2 viral copies this assay can detect is 250 copies / mL. A negative result does not preclude SARS-CoV-2 infection and should not be used as the sole basis for treatment or other patient management decisions.  A negative result may occur with improper specimen collection / handling, submission of specimen other than nasopharyngeal swab, presence of viral mutation(s) within the areas targeted by this assay, and inadequate number of viral copies (<250 copies / mL). A negative  result must be combined with clinical observations, patient history, and epidemiological information.  Fact Sheet for Patients:   https://www.patel.info/  Fact Sheet for Healthcare Providers: https://hall.com/  This test is not yet approved or  cleared by the [place] FDA and has been authorized for detection and/or diagnosis of SARS-CoV-2 by FDA under an Emergency Use Authorization (EUA).  This EUA will remain in effect (meaning this test can be used) for the duration of the COVID-19 declaration under Section 564(b)(1) of the Act, 21 U.S.C. section 360bbb-3(b)(1), unless the authorization is terminated or revoked sooner.  Performed at [facility], [place] [address] [place], [place] [address]      Labs:  CBC: Recent Labs  Lab  12/14/21 1636 12/14/21 2026  WBC 6.8 6.2  HGB 14.6 13.8  HCT 42.3 39.3  MCV 80.0 79.2*  PLT 277 265   BMP &GFR Recent Labs  Lab 12/16/21 0031 12/16/21 0658 12/17/21 0437 12/18/21 0500 12/19/21 0427  NA 141 142 139 137 139  K 3.6 3.4* 4.0 3.8 3.9  CL 108 112* 107 104 108  CO2 _0 GLUCOSE 272* 111* 232* 227* 155*  BUN _1 CREATININE 0.52* 0.43* 0.52* 0.42* 0.48*  CALCIUM 8.2* 8.2* 8.8* 8.6* 9.3   Estimated Creatinine Clearance: 151.8 mL/min (A) (by C-G formula based on SCr of 0.48 mg/dL (L)). Liver & Pancreas: Recent Labs  Lab 12/14/21 1636 12/15/21 0818 12/18/21 0500  AST 129* 111* 172*  ALT 166* 130* 191*  ALKPHOS 143* 81 97  BILITOT 1.1 0.5 0.7  PROT 7.8 6.3* 6.9  ALBUMIN 4.1 3.4* 3.5   Recent Labs  Lab 12/14/21 2026  LIPASE 33   No results for input(s): "AMMONIA" in the last 168 hours. Diabetic: No results for input(s): "HGBA1C" in the last 72 hours. Recent Labs  Lab 12/18/21 0742 12/18/21 1144 12/18/21 1551 12/18/21 2139 12/19/21 0734  GLUCAP 175* 124* 322* 103* 144*   Cardiac Enzymes: No results for input(s): "CKTOTAL",  "CKMB", "CKMBINDEX", "TROPONINI" in the last 168 hours. No results for input(s): "PROBNP" in the last 8760 hours. Coagulation Profile: No results for input(s): "INR", "PROTIME" in the last 168 hours. Thyroid Function Tests: No results for input(s): "TSH", "T4TOTAL", "FREET4", "T3FREE", "THYROIDAB" in the last 72 hours. Lipid Profile: No results for input(s): "CHOL", "HDL", "LDLCALC", "TRIG", "CHOLHDL", "LDLDIRECT" in the last 72 hours. Anemia Panel: No results for input(s): "VITAMINB12", "FOLATE", "FERRITIN", "TIBC", "IRON", "RETICCTPCT" in the last 72 hours. Urine analysis:    Component Value Date/Time   COLORURINE COLORLESS (A) 12/14/2021 1634   APPEARANCEUR CLEAR 12/14/2021 1634   LABSPEC 1.020 12/14/2021 1634   PHURINE 8.0 12/14/2021 1634   GLUCOSEU >=500 (A) 12/14/2021 1634   HGBUR NEGATIVE 12/14/2021 1634   BILIRUBINUR NEGATIVE 12/14/2021 1634   KETONESUR 5 (A) 12/14/2021 1634   PROTEINUR NEGATIVE 12/14/2021 1634   UROBILINOGEN 0.2 09/18/2019 1339   NITRITE NEGATIVE 12/14/2021 1634   LEUKOCYTESUR NEGATIVE 12/14/2021 1634   Sepsis Labs: Invalid input(s): "PROCALCITONIN", "LACTICIDVEN"   SIGNED:  Mercy Riding, MD  [facility] 12/19/2021, 10:24 AM

## 2022-04-13 ENCOUNTER — Encounter (HOSPITAL_COMMUNITY): Payer: Self-pay | Admitting: Emergency Medicine

## 2022-04-13 ENCOUNTER — Other Ambulatory Visit: Payer: Self-pay

## 2022-04-13 ENCOUNTER — Observation Stay (HOSPITAL_COMMUNITY)
Admission: EM | Admit: 2022-04-13 | Discharge: 2022-04-14 | DRG: 638 | Discharge status: Against Medical Advice | Payer: Medicaid Other | Attending: Internal Medicine | Admitting: Internal Medicine

## 2022-04-13 ENCOUNTER — Emergency Department (HOSPITAL_COMMUNITY): Payer: Medicaid Other

## 2022-04-13 DIAGNOSIS — Z833 Family history of diabetes mellitus: Secondary | ICD-10-CM | POA: Diagnosis not present

## 2022-04-13 DIAGNOSIS — Z5329 Procedure and treatment not carried out because of patient's decision for other reasons: Secondary | ICD-10-CM | POA: Diagnosis not present

## 2022-04-13 DIAGNOSIS — N179 Acute kidney failure, unspecified: Secondary | ICD-10-CM | POA: Diagnosis not present

## 2022-04-13 DIAGNOSIS — Z794 Long term (current) use of insulin: Secondary | ICD-10-CM | POA: Diagnosis not present

## 2022-04-13 DIAGNOSIS — J45909 Unspecified asthma, uncomplicated: Secondary | ICD-10-CM | POA: Diagnosis present

## 2022-04-13 DIAGNOSIS — B1920 Unspecified viral hepatitis C without hepatic coma: Secondary | ICD-10-CM | POA: Diagnosis present

## 2022-04-13 DIAGNOSIS — F449 Dissociative and conversion disorder, unspecified: Secondary | ICD-10-CM | POA: Diagnosis present

## 2022-04-13 DIAGNOSIS — E860 Dehydration: Secondary | ICD-10-CM | POA: Diagnosis not present

## 2022-04-13 DIAGNOSIS — F1510 Other stimulant abuse, uncomplicated: Secondary | ICD-10-CM | POA: Diagnosis not present

## 2022-04-13 DIAGNOSIS — R000 Tachycardia, unspecified: Secondary | ICD-10-CM | POA: Diagnosis present

## 2022-04-13 DIAGNOSIS — E1010 Type 1 diabetes mellitus with ketoacidosis without coma: Secondary | ICD-10-CM | POA: Diagnosis present

## 2022-04-13 DIAGNOSIS — Z87891 Personal history of nicotine dependence: Secondary | ICD-10-CM | POA: Diagnosis not present

## 2022-04-13 DIAGNOSIS — Z8249 Family history of ischemic heart disease and other diseases of the circulatory system: Secondary | ICD-10-CM

## 2022-04-13 DIAGNOSIS — Z91199 Patient's noncompliance with other medical treatment and regimen due to unspecified reason: Secondary | ICD-10-CM | POA: Diagnosis not present

## 2022-04-13 DIAGNOSIS — Z83438 Family history of other disorder of lipoprotein metabolism and other lipidemia: Secondary | ICD-10-CM

## 2022-04-13 DIAGNOSIS — Z79899 Other long term (current) drug therapy: Secondary | ICD-10-CM | POA: Diagnosis not present

## 2022-04-13 DIAGNOSIS — F319 Bipolar disorder, unspecified: Secondary | ICD-10-CM | POA: Diagnosis not present

## 2022-04-13 DIAGNOSIS — F988 Other specified behavioral and emotional disorders with onset usually occurring in childhood and adolescence: Secondary | ICD-10-CM | POA: Diagnosis not present

## 2022-04-13 DIAGNOSIS — F419 Anxiety disorder, unspecified: Secondary | ICD-10-CM | POA: Diagnosis not present

## 2022-04-13 DIAGNOSIS — R109 Unspecified abdominal pain: Secondary | ICD-10-CM | POA: Diagnosis present

## 2022-04-13 DIAGNOSIS — D75839 Thrombocytosis, unspecified: Secondary | ICD-10-CM | POA: Diagnosis not present

## 2022-04-13 DIAGNOSIS — Z9109 Other allergy status, other than to drugs and biological substances: Secondary | ICD-10-CM

## 2022-04-13 DIAGNOSIS — R0789 Other chest pain: Secondary | ICD-10-CM | POA: Diagnosis present

## 2022-04-13 DIAGNOSIS — E1110 Type 2 diabetes mellitus with ketoacidosis without coma: Secondary | ICD-10-CM | POA: Diagnosis present

## 2022-04-13 DIAGNOSIS — Z91013 Allergy to seafood: Secondary | ICD-10-CM

## 2022-04-13 DIAGNOSIS — E1065 Type 1 diabetes mellitus with hyperglycemia: Secondary | ICD-10-CM | POA: Diagnosis present

## 2022-04-13 DIAGNOSIS — Z825 Family history of asthma and other chronic lower respiratory diseases: Secondary | ICD-10-CM | POA: Diagnosis not present

## 2022-04-13 DIAGNOSIS — F1910 Other psychoactive substance abuse, uncomplicated: Secondary | ICD-10-CM | POA: Diagnosis present

## 2022-04-13 DIAGNOSIS — F1010 Alcohol abuse, uncomplicated: Secondary | ICD-10-CM | POA: Diagnosis present

## 2022-04-13 DIAGNOSIS — G40909 Epilepsy, unspecified, not intractable, without status epilepticus: Secondary | ICD-10-CM | POA: Diagnosis present

## 2022-04-13 DIAGNOSIS — R7401 Elevation of levels of liver transaminase levels: Secondary | ICD-10-CM | POA: Diagnosis present

## 2022-04-13 DIAGNOSIS — R7989 Other specified abnormal findings of blood chemistry: Secondary | ICD-10-CM

## 2022-04-13 DIAGNOSIS — I10 Essential (primary) hypertension: Secondary | ICD-10-CM | POA: Diagnosis present

## 2022-04-13 DIAGNOSIS — F329 Major depressive disorder, single episode, unspecified: Secondary | ICD-10-CM | POA: Diagnosis present

## 2022-04-13 DIAGNOSIS — D72829 Elevated white blood cell count, unspecified: Secondary | ICD-10-CM | POA: Diagnosis present

## 2022-04-13 DIAGNOSIS — Z91030 Bee allergy status: Secondary | ICD-10-CM

## 2022-04-13 LAB — CBC WITH DIFFERENTIAL/PLATELET
Abs Immature Granulocytes: 0.5 10*3/uL — ABNORMAL HIGH (ref 0.00–0.07)
Basophils Absolute: 0.3 10*3/uL — ABNORMAL HIGH (ref 0.0–0.1)
Basophils Relative: 2 %
Eosinophils Absolute: 0.1 10*3/uL (ref 0.0–0.5)
Eosinophils Relative: 1 %
HCT: 47 % (ref 39.0–52.0)
Hemoglobin: 14.5 g/dL (ref 13.0–17.0)
Lymphocytes Relative: 20 %
Lymphs Abs: 2.7 10*3/uL (ref 0.7–4.0)
MCH: 27.6 pg (ref 26.0–34.0)
MCHC: 30.9 g/dL (ref 30.0–36.0)
MCV: 89.5 fL (ref 80.0–100.0)
Metamyelocytes Relative: 2 %
Monocytes Absolute: 0.7 10*3/uL (ref 0.1–1.0)
Monocytes Relative: 5 %
Myelocytes: 2 %
Neutro Abs: 9.3 10*3/uL — ABNORMAL HIGH (ref 1.7–7.7)
Neutrophils Relative %: 68 %
Platelets: 505 10*3/uL — ABNORMAL HIGH (ref 150–400)
RBC: 5.25 MIL/uL (ref 4.22–5.81)
RDW: 14.7 % (ref 11.5–15.5)
WBC: 13.7 10*3/uL — ABNORMAL HIGH (ref 4.0–10.5)
nRBC: 0 % (ref 0.0–0.2)

## 2022-04-13 LAB — COMPREHENSIVE METABOLIC PANEL
ALT: 319 U/L — ABNORMAL HIGH (ref 0–44)
AST: 140 U/L — ABNORMAL HIGH (ref 15–41)
Albumin: 4.1 g/dL (ref 3.5–5.0)
Alkaline Phosphatase: 144 U/L — ABNORMAL HIGH (ref 38–126)
BUN: 19 mg/dL (ref 6–20)
CO2: <7 mmol/L — ABNORMAL LOW (ref 22–32)
Calcium: 9 mg/dL (ref 8.9–10.3)
Chloride: 95 mmol/L — ABNORMAL LOW (ref 98–111)
Creatinine, Ser: 1.3 mg/dL — ABNORMAL HIGH (ref 0.61–1.24)
GFR, Estimated: >60 mL/min (ref >60)
Glucose, Bld: 673 mg/dL (ref 70–99)
Potassium: 5.2 mmol/L — ABNORMAL HIGH (ref 3.5–5.1)
Sodium: 132 mmol/L — ABNORMAL LOW (ref 135–145)
Total Bilirubin: 2.4 mg/dL — ABNORMAL HIGH (ref 0.3–1.2)
Total Protein: 9.1 g/dL — ABNORMAL HIGH (ref 6.5–8.1)

## 2022-04-13 LAB — CBG MONITORING, ED
Glucose-Capillary: >600 mg/dL (ref 70–99)
Glucose-Capillary: >600 mg/dL (ref 70–99)
Glucose-Capillary: 494 mg/dL — ABNORMAL HIGH (ref 70–99)
Glucose-Capillary: 572 mg/dL (ref 70–99)
Glucose-Capillary: 351 mg/dL — ABNORMAL HIGH (ref 70–99)

## 2022-04-13 LAB — I-STAT CHEM 8, ED
BUN: 18 mg/dL (ref 6–20)
Calcium, Ion: 1.1 mmol/L — ABNORMAL LOW (ref 1.15–1.40)
Chloride: 103 mmol/L (ref 98–111)
Creatinine, Ser: 0.8 mg/dL (ref 0.61–1.24)
Glucose, Bld: 664 mg/dL (ref 70–99)
HCT: 49 % (ref 39.0–52.0)
Hemoglobin: 16.7 g/dL (ref 13.0–17.0)
Potassium: 5.4 mmol/L — ABNORMAL HIGH (ref 3.5–5.1)
Sodium: 131 mmol/L — ABNORMAL LOW (ref 135–145)
TCO2: 8 mmol/L — ABNORMAL LOW (ref 22–32)

## 2022-04-13 LAB — BLOOD GAS, VENOUS
Acid-base deficit: 21.1 mmol/L — ABNORMAL HIGH (ref 0.0–2.0)
Bicarbonate: 7.4 mmol/L — ABNORMAL LOW (ref 20.0–28.0)
O2 Saturation: 51.8 %
Patient temperature: 37.1
pCO2, Ven: 25 mmHg — ABNORMAL LOW (ref 44–60)
pH, Ven: 7.08 — CL (ref 7.25–7.43)
pO2, Ven: 41 mmHg (ref 32–45)

## 2022-04-13 LAB — LIPASE, BLOOD: Lipase: 24 U/L (ref 11–51)

## 2022-04-13 LAB — BETA-HYDROXYBUTYRIC ACID: Beta-Hydroxybutyric Acid: >8 mmol/L — ABNORMAL HIGH (ref 0.05–0.27)

## 2022-04-13 LAB — AMMONIA: Ammonia: 20 umol/L (ref 9–35)

## 2022-04-13 LAB — TROPONIN I (HIGH SENSITIVITY): Troponin I (High Sensitivity): <2 ng/L (ref <18)

## 2022-04-13 LAB — LACTIC ACID, PLASMA: Lactic Acid, Venous: 2.6 mmol/L (ref 0.5–1.9)

## 2022-04-13 MED ORDER — THIAMINE MONONITRATE 100 MG PO TABS
100.0000 mg | ORAL_TABLET | Freq: Every day | ORAL | Status: DC
  Administered 2022-04-14: 100 mg via ORAL
  Filled 2022-04-13: qty 1

## 2022-04-13 MED ORDER — ADULT MULTIVITAMIN W/MINERALS CH
1.0000 | ORAL_TABLET | Freq: Every day | ORAL | Status: DC
  Administered 2022-04-14: 1 via ORAL
  Filled 2022-04-13: qty 1

## 2022-04-13 MED ORDER — KETOROLAC TROMETHAMINE 15 MG/ML IJ SOLN
15.0000 mg | Freq: Once | INTRAMUSCULAR | Status: AC
  Administered 2022-04-13: 15 mg via INTRAVENOUS
  Filled 2022-04-13: qty 1

## 2022-04-13 MED ORDER — IOHEXOL 300 MG/ML  SOLN
100.0000 mL | Freq: Once | INTRAMUSCULAR | Status: AC | PRN
  Administered 2022-04-13: 100 mL via INTRAVENOUS

## 2022-04-13 MED ORDER — DEXTROSE 50 % IV SOLN: 0.0000 mL | INTRAVENOUS | Status: DC | PRN

## 2022-04-13 MED ORDER — NALOXONE HCL 0.4 MG/ML IJ SOLN: 0.4000 mg | Freq: Once | INTRAMUSCULAR | Status: DC

## 2022-04-13 MED ORDER — LORAZEPAM 2 MG/ML IJ SOLN: 1.0000 mg | INTRAMUSCULAR | Status: DC | PRN

## 2022-04-13 MED ORDER — DEXTROSE IN LACTATED RINGERS 5 % IV SOLN: INTRAVENOUS | Status: DC

## 2022-04-13 MED ORDER — THIAMINE HCL 100 MG/ML IJ SOLN: 100.0000 mg | Freq: Every day | INTRAMUSCULAR | Status: DC

## 2022-04-13 MED ORDER — FOLIC ACID 1 MG PO TABS
1.0000 mg | ORAL_TABLET | Freq: Every day | ORAL | Status: DC
  Administered 2022-04-14: 1 mg via ORAL
  Filled 2022-04-13: qty 1

## 2022-04-13 MED ORDER — ONDANSETRON HCL 4 MG/2ML IJ SOLN
4.0000 mg | Freq: Once | INTRAMUSCULAR | Status: AC
  Administered 2022-04-13: 4 mg via INTRAVENOUS
  Filled 2022-04-13: qty 2

## 2022-04-13 MED ORDER — LACTATED RINGERS IV BOLUS
1000.0000 mL | Freq: Once | INTRAVENOUS | Status: AC
  Administered 2022-04-14: 1000 mL via INTRAVENOUS

## 2022-04-13 MED ORDER — LORAZEPAM 1 MG PO TABS: 1.0000 mg | ORAL_TABLET | ORAL | Status: DC | PRN

## 2022-04-13 MED ORDER — LACTATED RINGERS IV SOLN: INTRAVENOUS | Status: DC

## 2022-04-13 MED ORDER — INSULIN REGULAR(HUMAN) IN NACL 100-0.9 UT/100ML-% IV SOLN
INTRAVENOUS | Status: DC
  Administered 2022-04-13: 11 [IU]/h via INTRAVENOUS
  Administered 2022-04-14: 5 [IU]/h via INTRAVENOUS
  Filled 2022-04-13: qty 100

## 2022-04-13 MED ORDER — LACTATED RINGERS IV BOLUS
1000.0000 mL | Freq: Once | INTRAVENOUS | Status: AC
  Administered 2022-04-13: 1000 mL via INTRAVENOUS

## 2022-04-13 NOTE — ED Triage Notes (Signed)
Pt c/o left sided chest pain with radiation to left arm starting today, pt reports blood sugar high

## 2022-04-13 NOTE — ED Provider Notes (Signed)
[facility] DEPT Provider Note   CSN: [id number] Arrival date & time: 04/13/22  1944     History Chief Complaint  Patient presents with   Chest Pain    [name] [name]. is a 21 y.o. male per chart review has a history of hepatitis C with coma, epilepsy, type 1 diabetes, bipolar, BPD, amphetamine use disorder DKA, noncompliance, IV drug use, Crohn's presents to the emergency department for evaluation of chest pain for the past week?  And elevated sugars.  According to triage nursing note, patient presented with chest pains and elevated sugars.  On my evaluation, patient would not communicate what he was here for other than his elevated sugars and chest and abdominal pain.  He reports that he has some abdominal pain and chest pain but would not answer the rest of my questions.  He denies any drug use today but reports he may still have some in his system.  Patient has emesis bag with nonbloody emesis present.  Patient is oriented x3, ill-appearing.  Denies any alcohol use.  Does not member the last time use drugs.   Chest Pain      Home Medications Prior to Admission medications   Medication Sig Start Date End Date Taking? Authorizing Provider  Accu-Chek Softclix Lancets lancets Use to test blood sugars in the morning and at bed time. 11/13/21   Shelly Coss, MD  blood glucose meter kit and supplies KIT Dispense based on patient and insurance preference. Use up to four times daily as directed. (FOR ICD-9 250.00, 250.01). 11/09/20   Florencia Reasons, MD  Blood Glucose Monitoring Suppl (ACCU-CHEK GUIDE) w/Device KIT Use as directed 11/13/21   Shelly Coss, MD  Cyanocobalamin (VITAMIN B12 PO) Take 1 tablet by mouth daily.    [provider]  EPINEPHrine 0.3 mg/0.3 mL IJ SOAJ injection Inject 0.3 mg into the muscle once as needed for anaphylaxis (severe allergic reaction). 05/02/20   [provider]  FLUoxetine (PROZAC) 10 MG capsule Take 1  capsule (10 mg total) by mouth daily. Patient not taking: Reported on 12/14/2021 12/05/21   Jonetta Osgood, MD  glucose blood (ACCU-CHEK GUIDE) test strip Use to test blood sugars in the morning and bed time. 11/13/21   Shelly Coss, MD  insulin aspart (NOVOLOG FLEXPEN) 100 UNIT/ML FlexPen Inject 8 Units into the skin 3 (three) times daily with meals. Patient taking differently: Inject 10 Units into the skin 4 (four) times daily. 12/04/21   Ghimire, Henreitta Leber, MD  insulin aspart (NOVOLOG) 100 UNIT/ML injection Inject 0-20 Units into the skin 3 (three) times daily with meals. 12/19/21   Mercy Riding, MD  insulin glargine (LANTUS) 100 UNIT/ML Solostar Pen Inject 35 Units into the skin in the morning and at bedtime. Patient taking differently: Inject 40 Units into the skin in the morning and at bedtime. 12/04/21   Ghimire, Henreitta Leber, MD  Insulin Pen Needle 32G X 4 MM MISC Use as directed 12/04/21   Ghimire, Henreitta Leber, MD  Insulin Pen Needle 32G X 4 MM MISC Use to inject insulin 3 times daily as directed. 12/04/21   Ghimire, Henreitta Leber, MD  lisinopril (ZESTRIL) 10 MG tablet Take 1 tablet (10 mg total) by mouth daily. Patient not taking: Reported on 12/14/2021 12/05/21   Jonetta Osgood, MD  ondansetron (ZOFRAN) 4 MG tablet Take 4 mg by mouth every 8 (eight) hours as needed for nausea or vomiting.    [provider]  QUEtiapine (  SEROQUEL) 25 MG tablet Take 75 mg by mouth at bedtime.    [provider]  sertraline (ZOLOFT) 100 MG tablet Take 100 mg by mouth daily.    [provider]  tamsulosin (FLOMAX) 0.4 MG CAPS capsule Take 0.4 mg by mouth daily.    [provider]      Allergies    Bee venom, Fish allergy, Shellfish allergy, and Tea    Review of Systems   Review of Systems  Unable to perform ROS: Acuity of condition  Cardiovascular:  Positive for chest pain.    Physical Exam Updated Vital Signs BP 135/88   Pulse (!) 106   Temp 98.6 F (37 C) (Oral)   Resp  (!) 26   Ht 5' 10.5" (1.791 m)   Wt 73.9 kg   SpO2 100%   BMI 23.06 kg/m  Physical Exam Vitals and nursing note reviewed.  Constitutional:      Appearance: He is ill-appearing.     Comments: Smells of ketones in the room  HENT:     Head: Normocephalic and atraumatic.  Cardiovascular:     Rate and Rhythm: Tachycardia present.  Pulmonary:     Effort: Tachypnea present. No accessory muscle usage.     Comments: Tachypnea, without accessory muscle use.  Chest:     Chest wall: Tenderness present.  Abdominal:     Palpations: Abdomen is soft.     Tenderness: There is abdominal tenderness.     Comments: Diffuse abdominal tenderness. Soft. No guarding or rebound.   Skin:    Comments: Diffuse tattoos  Neurological:     Mental Status: He is oriented to person, place, and time.     Comments: Patient did not want to communicate, but was oriented to person, place, and time.      ED Results / Procedures / Treatments   Labs (all labs ordered are listed, but only abnormal results are displayed) Labs Reviewed  CBC WITH DIFFERENTIAL/PLATELET - Abnormal; Notable for the following components:      Result Value   WBC 13.7 (*)    Platelets 505 (*)    All other components within normal limits  BLOOD GAS, VENOUS - Abnormal; Notable for the following components:   pH, Ven 7.08 (*)    pCO2, Ven 25 (*)    Bicarbonate 7.4 (*)    Acid-base deficit 21.1 (*)    All other components within normal limits  LACTIC ACID, PLASMA - Abnormal; Notable for the following components:   Lactic Acid, Venous 2.6 (*)    All other components within normal limits  CBG MONITORING, ED - Abnormal; Notable for the following components:   Glucose-Capillary >600 (*)    All other components within normal limits  I-STAT CHEM 8, ED - Abnormal; Notable for the following components:   Sodium 131 (*)    Potassium 5.4 (*)    Glucose, Bld 664 (*)    Calcium, Ion 1.10 (*)    TCO2 8 (*)    All other components within normal  limits  CBG MONITORING, ED - Abnormal; Notable for the following components:   Glucose-Capillary >600 (*)    All other components within normal limits  AMMONIA  COMPREHENSIVE METABOLIC PANEL  RAPID URINE DRUG SCREEN, HOSP PERFORMED  URINALYSIS, ROUTINE W REFLEX MICROSCOPIC  BETA-HYDROXYBUTYRIC ACID  LACTIC ACID, PLASMA  LIPASE, BLOOD  TROPONIN I (HIGH SENSITIVITY)    EKG EKG Interpretation  Date/Time:  Sunday April 13 2022 20:12:29 EDT Ventricular Rate:  113 PR Interval:  133 QRS Duration: 80 QT Interval:  334 QTC Calculation: 458 R Axis:   81 Text Interpretation: Sinus tachycardia Borderline T abnormalities, inferior leads Borderline ST elevation, anterior leads Confirmed by Godfrey Pick [id number]) on 04/13/2022 8:32:26 PM  Radiology CT CHEST ABDOMEN PELVIS W CONTRAST  Result Date: 04/13/2022 CLINICAL DATA:  Abdominal and chest pain.  Elevated blood glucose. EXAM: CT CHEST, ABDOMEN, AND PELVIS WITH CONTRAST TECHNIQUE: Multidetector CT imaging of the chest, abdomen and pelvis was performed following the standard protocol during bolus administration of intravenous contrast. RADIATION DOSE REDUCTION: This exam was performed according to the departmental dose-optimization program which includes automated exposure control, adjustment of the mA and/or kV according to patient size and/or use of iterative reconstruction technique. CONTRAST:  117m OMNIPAQUE IOHEXOL 300 MG/ML  SOLN COMPARISON:  CT examination dated Nov 04, 2021 FINDINGS: CT CHEST FINDINGS Cardiovascular: No significant vascular findings. Normal heart size. No pericardial effusion. Mediastinum/Nodes: No enlarged mediastinal, hilar, or axillary lymph nodes. Thyroid gland, trachea, and esophagus demonstrate no significant findings. Lungs/Pleura: Lungs are clear. No pleural effusion or pneumothorax. Musculoskeletal: No chest wall mass or suspicious bone lesions identified. CT ABDOMEN PELVIS FINDINGS Hepatobiliary: Liver is mildly  enlarged measuring approximately 22.3 cm in craniocaudal dimension no focal liver abnormality is seen. No gallstones, gallbladder wall thickening, or biliary dilatation. Pancreas: Unremarkable. No pancreatic ductal dilatation or surrounding inflammatory changes. Spleen: Normal in size without focal abnormality. Adrenals/Urinary Tract: Adrenal glands are unremarkable. Kidneys are normal, without renal calculi, focal lesion, or hydronephrosis. Markedly distended urinary bladder. Stomach/Bowel: Stomach is within normal limits. Appendix appears normal. No evidence of bowel wall thickening, distention, or inflammatory changes. Vascular/Lymphatic: No significant vascular findings are present. No enlarged abdominal or pelvic lymph nodes. Reproductive: Prostate is unremarkable. Other: No abdominal wall hernia or abnormality. No abdominopelvic ascites. Musculoskeletal: No acute or significant osseous findings. IMPRESSION: 1. No evidence of acute abnormality within the chest, abdomen or pelvis. 2. Markedly distended urinary bladder. 3. Mild hepatomegaly, unchanged. Electronically Signed   By: IKeane PoliceD.O.   On: 04/13/2022 23:02   DG Chest Port 1 View  Result Date: 04/13/2022 CLINICAL DATA:  Left-sided chest pain. EXAM: PORTABLE CHEST 1 VIEW COMPARISON:  Chest radiograph dated November 30, 2021 FINDINGS: The heart size and mediastinal contours are within normal limits. Both lungs are clear. The visualized skeletal structures are unremarkable. IMPRESSION: No active disease. Electronically Signed   By: IKeane PoliceD.O.   On: 04/13/2022 20:27    Procedures .Critical Care  Performed by: RSherrell Puller PA-C Authorized by: RSherrell Puller PA-C   Critical care provider statement:    Critical care time (minutes):  30   Critical care was necessary to treat or prevent imminent or life-threatening deterioration of the following conditions:  Endocrine crisis   Critical care was time spent personally by me on the following  activities:  Development of treatment plan with patient or surrogate, discussions with consultants, evaluation of patient's response to treatment, examination of patient, ordering and review of laboratory studies, ordering and review of radiographic studies, ordering and performing treatments and interventions, pulse oximetry, re-evaluation of patient's condition, review of old charts and obtaining history from patient or surrogate   Care discussed with: admitting provider     Medications Ordered in ED Medications  insulin regular, human (MYXREDLIN) 100 units/ 100 mL infusion (11 Units/hr Intravenous New Bag/Given 04/13/22 2100)  lactated ringers infusion ( Intravenous New Bag/Given 04/13/22 2156)  dextrose 5 % in lactated ringers  infusion (has no administration in time range)  dextrose 50 % solution 0-50 mL (has no administration in time range)  LORazepam (ATIVAN) tablet 1-4 mg (has no administration in time range)    Or  LORazepam (ATIVAN) injection 1-4 mg (has no administration in time range)  thiamine (VITAMIN B1) tablet 100 mg (has no administration in time range)    Or  thiamine (VITAMIN B1) injection 100 mg (has no administration in time range)  folic acid (FOLVITE) tablet 1 mg (has no administration in time range)  multivitamin with minerals tablet 1 tablet (has no administration in time range)  lactated ringers bolus 1,000 mL (0 mLs Intravenous Stopped 04/13/22 2156)  ondansetron (ZOFRAN) injection 4 mg (4 mg Intravenous Given 04/13/22 2154)  ketorolac (TORADOL) 15 MG/ML injection 15 mg (15 mg Intravenous Given 04/13/22 2154)  iohexol (OMNIPAQUE) 300 MG/ML solution 100 mL (100 mLs Intravenous Contrast Given 04/13/22 2209)    ED Course/ Medical Decision Making/ A&P                           Medical Decision Making Amount and/or Complexity of Data Reviewed Labs: ordered. Radiology: ordered.  Risk OTC drugs. Prescription drug management. Decision regarding  hospitalization.   21 year old male with history of type 1 diabetes, drug use, DKA presents emergency department for evaluation of chest and abdominal pain and elevated sugars.  Differential diagnosis was recently to hyperglycemia, HHS, DKA, ACS, arrhythmia, PE, pneumothorax, pneumonia, dissection, aneurysm.  Vital signs show elevated blood pressure 141/95, afebrile, tachycardia at 110, with tachypnea satting well on room air.  Physical exam as noted above.  I assessed the patient at bedside on arrival to the room.  Labs ordered.  CT abdomen and pelvis as well as chest ordered.  I independently reviewed and interpreted the patient's labs. CBC shows white count at 13.7 with a left shift, could be acute phase reaction or some intra-abdominal process.  Platelets are elevated at 505.  Beta hydroxybutyric acid is greater than 8.  I-STAT Chem-8 shows glucose at 64.  Normal creatinine.  Show some hyponatremia although likely pseudohyponatremia given the patient's elevated glucose.  Potassium of 5.4.  Lactic acid elevated at 2.6, repeat pending.  Ammonia normal at 20.  VBG shows pH of 7.08.  Troponin less than 2.  CMP shows pseudohyponatremia with a sodium of 132 and a glucose of 673.  Potassium slightly elevated 5.2.  Chloride 95.  Bicarb undetectable.  Creatinine at 1.3, possible AKI.  He does have elevated AST, ALT, alk phos, and total bili of 2.4.  He does have a history of hepatitis C and transaminitis.  CT imaging showed IMPRESSION: 1. No evidence of acute abnormality within the chest, abdomen or pelvis. 2. Markedly distended urinary bladder. 3. Mild hepatomegaly, unchanged.  EKG reviewed and interpreted by my attending and shows sinus tachycardia with borderline T abnormalities, inferior leads with borderline ST elevation.  I likely think the patient's abdominal pain was from vomiting from his DKA.  Will have a Foley placed given that the patient is somnolent and does appear to have a full bladder on CT  imaging.  Discussed with my attending who agrees.  Patient denies any alcohol use, however he does have hepatitis which could be from IV drug use however patient has not an extremely reliable historian so we will order CIWA protocols for safety measures.  On reevaluation, patient does appear better.  He reports he would like some pain and  nausea medication for his abdominal pain.  He is still somnolent however does wake to touch.  Patient will need admission, do not see any ICU admission needed at this time.  Dr. Marlowe Sax accepted the admission but requested a be met and VBG be ran because she will not see the patient for a while.  Discussed this with my colleague, Kennith Maes,  to follow-up for any worsening labs.   I discussed this case with my attending physician who cosigned this note including patient's presenting symptoms, physical exam, and planned diagnostics and interventions. Attending physician stated agreement with plan or made changes to plan which were implemented.   Attending physician assessed patient at bedside.  Final Clinical Impression(s) / ED Diagnoses Final diagnoses:  Type 1 diabetes mellitus with ketoacidosis without coma ([place])  Elevated LFTs    Rx / DC Orders ED Discharge Orders     None         Sherrell Puller, PA-C 04/13/22 2348    Godfrey Pick, MD 04/16/22 [phone]

## 2022-04-13 NOTE — ED Notes (Signed)
Pt is being verbally aggressive. Pt refuses to have a foley catheter and states that he would rather die of DKA then have a foley.

## 2022-04-14 DIAGNOSIS — F419 Anxiety disorder, unspecified: Secondary | ICD-10-CM

## 2022-04-14 DIAGNOSIS — R1013 Epigastric pain: Secondary | ICD-10-CM

## 2022-04-14 DIAGNOSIS — E1010 Type 1 diabetes mellitus with ketoacidosis without coma: Principal | ICD-10-CM

## 2022-04-14 DIAGNOSIS — R4689 Other symptoms and signs involving appearance and behavior: Secondary | ICD-10-CM

## 2022-04-14 DIAGNOSIS — F1510 Other stimulant abuse, uncomplicated: Secondary | ICD-10-CM

## 2022-04-14 DIAGNOSIS — F329 Major depressive disorder, single episode, unspecified: Secondary | ICD-10-CM

## 2022-04-14 DIAGNOSIS — F988 Other specified behavioral and emotional disorders with onset usually occurring in childhood and adolescence: Secondary | ICD-10-CM | POA: Diagnosis not present

## 2022-04-14 DIAGNOSIS — F449 Dissociative and conversion disorder, unspecified: Secondary | ICD-10-CM

## 2022-04-14 DIAGNOSIS — J45909 Unspecified asthma, uncomplicated: Secondary | ICD-10-CM

## 2022-04-14 DIAGNOSIS — E1110 Type 2 diabetes mellitus with ketoacidosis without coma: Secondary | ICD-10-CM | POA: Diagnosis present

## 2022-04-14 DIAGNOSIS — F1910 Other psychoactive substance abuse, uncomplicated: Secondary | ICD-10-CM

## 2022-04-14 DIAGNOSIS — Z91199 Patient's noncompliance with other medical treatment and regimen due to unspecified reason: Secondary | ICD-10-CM

## 2022-04-14 DIAGNOSIS — F319 Bipolar disorder, unspecified: Secondary | ICD-10-CM

## 2022-04-14 DIAGNOSIS — E1065 Type 1 diabetes mellitus with hyperglycemia: Secondary | ICD-10-CM

## 2022-04-14 LAB — CBG MONITORING, ED
Glucose-Capillary: 106 mg/dL — ABNORMAL HIGH (ref 70–99)
Glucose-Capillary: 155 mg/dL — ABNORMAL HIGH (ref 70–99)
Glucose-Capillary: 156 mg/dL — ABNORMAL HIGH (ref 70–99)
Glucose-Capillary: 171 mg/dL — ABNORMAL HIGH (ref 70–99)
Glucose-Capillary: 176 mg/dL — ABNORMAL HIGH (ref 70–99)
Glucose-Capillary: 187 mg/dL — ABNORMAL HIGH (ref 70–99)
Glucose-Capillary: 206 mg/dL — ABNORMAL HIGH (ref 70–99)
Glucose-Capillary: 208 mg/dL — ABNORMAL HIGH (ref 70–99)
Glucose-Capillary: 210 mg/dL — ABNORMAL HIGH (ref 70–99)
Glucose-Capillary: 216 mg/dL — ABNORMAL HIGH (ref 70–99)
Glucose-Capillary: 219 mg/dL — ABNORMAL HIGH (ref 70–99)
Glucose-Capillary: 247 mg/dL — ABNORMAL HIGH (ref 70–99)

## 2022-04-14 LAB — CBC
HCT: 34.1 % — ABNORMAL LOW (ref 39.0–52.0)
Hemoglobin: 11.3 g/dL — ABNORMAL LOW (ref 13.0–17.0)
MCH: 28 pg (ref 26.0–34.0)
MCHC: 33.1 g/dL (ref 30.0–36.0)
MCV: 84.4 fL (ref 80.0–100.0)
Platelets: 393 10*3/uL (ref 150–400)
RBC: 4.04 MIL/uL — ABNORMAL LOW (ref 4.22–5.81)
RDW: 14.7 % (ref 11.5–15.5)
WBC: 10.6 10*3/uL — ABNORMAL HIGH (ref 4.0–10.5)
nRBC: 0 % (ref 0.0–0.2)

## 2022-04-14 LAB — BASIC METABOLIC PANEL
Anion gap: 18 — ABNORMAL HIGH (ref 5–15)
Anion gap: 14 (ref 5–15)
Anion gap: 6 (ref 5–15)
BUN: 17 mg/dL (ref 6–20)
BUN: 13 mg/dL (ref 6–20)
BUN: 10 mg/dL (ref 6–20)
CO2: 14 mmol/L — ABNORMAL LOW (ref 22–32)
CO2: 15 mmol/L — ABNORMAL LOW (ref 22–32)
CO2: 21 mmol/L — ABNORMAL LOW (ref 22–32)
Calcium: 8.9 mg/dL (ref 8.9–10.3)
Calcium: 8.1 mg/dL — ABNORMAL LOW (ref 8.9–10.3)
Calcium: 8 mg/dL — ABNORMAL LOW (ref 8.9–10.3)
Chloride: 107 mmol/L (ref 98–111)
Chloride: 106 mmol/L (ref 98–111)
Chloride: 109 mmol/L (ref 98–111)
Creatinine, Ser: 1.03 mg/dL (ref 0.61–1.24)
Creatinine, Ser: 0.82 mg/dL (ref 0.61–1.24)
Creatinine, Ser: 0.66 mg/dL (ref 0.61–1.24)
GFR, Estimated: >60 mL/min (ref >60)
GFR, Estimated: >60 mL/min (ref >60)
GFR, Estimated: >60 mL/min (ref >60)
Glucose, Bld: 256 mg/dL — ABNORMAL HIGH (ref 70–99)
Glucose, Bld: 212 mg/dL — ABNORMAL HIGH (ref 70–99)
Glucose, Bld: 104 mg/dL — ABNORMAL HIGH (ref 70–99)
Potassium: 4.1 mmol/L (ref 3.5–5.1)
Potassium: 3.7 mmol/L (ref 3.5–5.1)
Potassium: 3.5 mmol/L (ref 3.5–5.1)
Sodium: 139 mmol/L (ref 135–145)
Sodium: 135 mmol/L (ref 135–145)
Sodium: 136 mmol/L (ref 135–145)

## 2022-04-14 LAB — RAPID URINE DRUG SCREEN, HOSP PERFORMED
Amphetamines: POSITIVE — AB
Barbiturates: NOT DETECTED
Benzodiazepines: NOT DETECTED
Cocaine: NOT DETECTED
Opiates: NOT DETECTED
Tetrahydrocannabinol: NOT DETECTED

## 2022-04-14 LAB — BLOOD GAS, VENOUS
Acid-base deficit: 11.3 mmol/L — ABNORMAL HIGH (ref 0.0–2.0)
Bicarbonate: 12.7 mmol/L — ABNORMAL LOW (ref 20.0–28.0)
O2 Saturation: 72.4 %
Patient temperature: 37.1
pCO2, Ven: 24 mmHg — ABNORMAL LOW (ref 44–60)
pH, Ven: 7.33 (ref 7.25–7.43)
pO2, Ven: 44 mmHg (ref 32–45)

## 2022-04-14 LAB — URINALYSIS, ROUTINE W REFLEX MICROSCOPIC
Bilirubin Urine: NEGATIVE
Glucose, UA: >=500 mg/dL — AB
Hgb urine dipstick: NEGATIVE
Ketones, ur: 80 mg/dL — AB
Leukocytes,Ua: NEGATIVE
Nitrite: NEGATIVE
Protein, ur: NEGATIVE mg/dL
Specific Gravity, Urine: 1.017 (ref 1.005–1.030)
pH: 5 (ref 5.0–8.0)

## 2022-04-14 LAB — BETA-HYDROXYBUTYRIC ACID: Beta-Hydroxybutyric Acid: 1.52 mmol/L — ABNORMAL HIGH (ref 0.05–0.27)

## 2022-04-14 LAB — HEMOGLOBIN A1C
Hgb A1c MFr Bld: 9.3 % — ABNORMAL HIGH (ref 4.8–5.6)
Mean Plasma Glucose: 220.21 mg/dL

## 2022-04-14 LAB — HEPATIC FUNCTION PANEL
ALT: 205 U/L — ABNORMAL HIGH (ref 0–44)
AST: 78 U/L — ABNORMAL HIGH (ref 15–41)
Albumin: 3 g/dL — ABNORMAL LOW (ref 3.5–5.0)
Alkaline Phosphatase: 93 U/L (ref 38–126)
Bilirubin, Direct: <0.1 mg/dL (ref 0.0–0.2)
Total Bilirubin: 1.5 mg/dL — ABNORMAL HIGH (ref 0.3–1.2)
Total Protein: 6.5 g/dL (ref 6.5–8.1)

## 2022-04-14 LAB — ETHANOL: Alcohol, Ethyl (B): <10 mg/dL (ref <10)

## 2022-04-14 LAB — TROPONIN I (HIGH SENSITIVITY): Troponin I (High Sensitivity): 2 ng/L (ref <18)

## 2022-04-14 MED ORDER — INSULIN GLARGINE-YFGN 100 UNIT/ML ~~LOC~~ SOLN
20.0000 [IU] | Freq: Two times a day (BID) | SUBCUTANEOUS | Status: DC
  Administered 2022-04-14: 20 [IU] via SUBCUTANEOUS
  Filled 2022-04-14 (×2): qty 0.2

## 2022-04-14 MED ORDER — INSULIN ASPART 100 UNIT/ML IJ SOLN
3.0000 [IU] | Freq: Three times a day (TID) | INTRAMUSCULAR | Status: DC
  Administered 2022-04-14: 3 [IU] via SUBCUTANEOUS
  Filled 2022-04-14: qty 0.03

## 2022-04-14 MED ORDER — INSULIN GLARGINE-YFGN 100 UNIT/ML ~~LOC~~ SOLN
30.0000 [IU] | Freq: Two times a day (BID) | SUBCUTANEOUS | Status: DC
  Filled 2022-04-14: qty 0.3

## 2022-04-14 MED ORDER — INSULIN ASPART 100 UNIT/ML IJ SOLN
0.0000 [IU] | Freq: Every day | INTRAMUSCULAR | Status: DC
  Filled 2022-04-14: qty 0.05

## 2022-04-14 MED ORDER — ENOXAPARIN SODIUM 40 MG/0.4ML IJ SOSY
40.0000 mg | PREFILLED_SYRINGE | INTRAMUSCULAR | Status: DC
  Filled 2022-04-14: qty 0.4

## 2022-04-14 MED ORDER — INSULIN ASPART 100 UNIT/ML IJ SOLN
0.0000 [IU] | Freq: Three times a day (TID) | INTRAMUSCULAR | Status: DC
  Administered 2022-04-14 (×2): 5 [IU] via SUBCUTANEOUS
  Filled 2022-04-14: qty 0.15

## 2022-04-14 MED ORDER — INSULIN ASPART 100 UNIT/ML IJ SOLN
6.0000 [IU] | Freq: Three times a day (TID) | INTRAMUSCULAR | Status: DC
  Administered 2022-04-14: 6 [IU] via SUBCUTANEOUS
  Filled 2022-04-14: qty 0.06

## 2022-04-14 MED ORDER — QUETIAPINE FUMARATE 25 MG PO TABS: 75.0000 mg | ORAL_TABLET | Freq: Every day | ORAL | Status: DC

## 2022-04-14 MED ORDER — SERTRALINE HCL 100 MG PO TABS: 100.0000 mg | ORAL_TABLET | Freq: Every day | ORAL | Status: DC

## 2022-04-14 NOTE — ED Notes (Signed)
Pt resting in bed with eyes closed. No distress noted. RR even and unlabored. Edison Simon provided due to being hot earlier. VSS

## 2022-04-14 NOTE — Inpatient Diabetes Management (Signed)
Inpatient Diabetes Program Recommendations  AACE/ADA: New Consensus Statement on Inpatient Glycemic Control (2015)  Target Ranges:  Prepandial:   less than 140 mg/dL      Peak postprandial:   less than 180 mg/dL (1-2 hours)      Critically ill patients:  140 - 180 mg/dL   Lab Results  Component Value Date   GLUCAP 206 (H) 04/14/2022   HGBA1C 9.3 (H) 04/14/2022    Review of Glycemic Control  Diabetes history: DM1 Outpatient Diabetes medications: Lantus 40 BID, Novolog 0-20 TID with meals + 10 units TID  Current orders for Inpatient glycemic control: Semglee 20 BID, Novolog 0-15 TID with meals and 0-5 HS + 3 units TID  HgbA1C - 9.3%  Inpatient Diabetes Program Recommendations:    Increase Novolog to 6 units TID with meals if eating > 50%  Will likely need more Semglee - 30 units BID  TOC consult for assistance with PCP and insulin at discharge. Pt states he's been in drug rehab in [place] and just got back to [place] 2 days ago. Has not taken insulin in approx 1 week.   DKA is cleared and transitioned to SQ insulin. Will speak with pt again in am regarding his diabetes control and importance of getting his insulin and taking as prescribed. Very familiar with pt from previous admissions.   Will continue to follow.  Thank you. Lorenda Peck, RD, LDN, [place] Inpatient Diabetes Coordinator [phone]

## 2022-04-14 NOTE — Progress Notes (Signed)
                                                  Against Medical Advice Patient at this time expresses desire to leave the Hospital immediately, patient has been warned that this is not Medically advisable at this time, and can result in Medical complications like Death and Disability, patient understands and accepts the risks involved and assumes full responsibilty of this decision.  This patient has also been advised that if they feel the need for further medical assistance to return to any available ER or dial 9-1-1.  Informed by Nursing staff that this patient has left care and has signed the form  Against Medical Advice on 04/14/2022 at [facility]  Gershon Cull BSN MSNA MSN ACNPC-AG Acute Care Nurse Practitioner [place]

## 2022-04-14 NOTE — Progress Notes (Signed)
Pt's chart is marked for blood product refusal. Asked pt if he refuses blood products and he states no. Verified with pt that he will take blood products if he needs them. Told pt's rn this and she verified with him. Blood product refusal deactivated on chart. Doroteo Bradford BSN, RN-BC Throughput Nurse 04/14/2022 3:16 PM

## 2022-04-14 NOTE — Inpatient Diabetes Management (Signed)
Inpatient Diabetes Program Recommendations  AACE/ADA: New Consensus Statement on Inpatient Glycemic Control (2015)  Target Ranges:  Prepandial:   less than 140 mg/dL      Peak postprandial:   less than 180 mg/dL (1-2 hours)      Critically ill patients:  140 - 180 mg/dL   Lab Results  Component Value Date   GLUCAP 106 (H) 04/14/2022   HGBA1C 9.3 (H) 04/14/2022    Review of Glycemic Control  Diabetes history: DM1 Outpatient Diabetes medications: Lantus 40 BID, Novolog 10 QID, 0-20 TID Current orders for Inpatient glycemic control: IV insulin per [employer] for DKA  HgbA1C - 9.3% CO2 still low at 15, AG 14  Inpatient Diabetes Program Recommendations:    Need another beta-hydroxybutyric acid results.  When criteria met for discontinuation of IV insulin, give basal insulin 1-2H before d/cing drip.  Semglee 20 units BID Novolog 0-15 TID with meals and 0-5 HS Novolog 5 units TID if eating > 50%  Familiar with pt from previous admissions.  Will see this morning to discuss his diabetes control.  Continue to follow.  Thank you. Lorenda Peck, RD, LDN, [place] Inpatient Diabetes Coordinator [phone]

## 2022-04-14 NOTE — ED Notes (Signed)
Pt stated that he would be willing to accept blood if it was necessary for his care. RN notified.

## 2022-04-14 NOTE — H&P (Signed)
History and Physical    [name] [name]. IRS:[id number] DOB: [birth year] DOA: 04/13/2022  PCP: [facility]  Patient coming from: Home  Chief Complaint: Chest pain  HPI: [name] [name]. is a 21 y.o. male with medical history significant of poorly controlled type 1 diabetes, epilepsy, hypertension, hep C, Crohn's disease, previous bowel obstruction, asthma, bipolar disorder, ADD, ODD, anxiety, depression, conversion disorder, polysubstance abuse (methamphetamine, tobacco), history of suicidal ideation, history of medical noncompliance presented to ED with complaints of chest pain, abdominal pain, nausea, vomiting, and hyperglycemia.  In the ED, patient slightly tachycardic but afebrile.  Not hypotensive or hypoxic.  Labs showing WBC 13.7, platelet count 505k, sodium 132, potassium 5.2, chloride 95, glucose 673, bicarb <7, UA with ketones and not suggestive of infection, beta hydroxybutyric acid >8.0, VBG with pH 7.0, creatinine 1.3 (baseline 0.4-0.5), AST 140, ALT 319, alk phos 144, T. bili 2.4, lipase normal, high-sensitivity troponin negative x2, blood ethanol level undetectable, UDS positive for amphetamines, ammonia normal, lactic acid 2.6.  CT chest/abdomen/pelvis negative for acute abnormality.  Showing markedly distended urinary bladder and mild stable hepatomegaly.  Patient subsequently voided 1200 cc urine.  He was given Toradol, Zofran, 2 L LR boluses, and started on insulin infusion.  Repeat BMP showing sodium 139, potassium 4.1, chloride 107, bicarb 14, anion gap 18, glucose 256, creatinine 1.0.  Repeat VBG with pH 7.3.  Patient awake and alert but refused to speak to me.  I am not able to obtain any history at this time.  Review of Systems:  Review of Systems  All other systems reviewed and are negative.   Past Medical History:  Diagnosis Date   ADD (attention deficit disorder)    Allergy    Anxiety    Asthma    Depression    DKA (diabetic  ketoacidosis) ([place]) 11/10/2020   Epileptic seizure ([place])    "both petit and grand mal; last sz ~ 2 wk ago" (06/17/2013)   Fracture of multiple ribs 05/11/2020   Headache(784.0)    "2-3 times/wk usually" (06/17/2013)   Heart murmur    "heard a slight one earlier today" (06/17/2013)   Homeless    Methamphetamine intoxication ([place]) 10/26/2018   Migraine    "maybe once/month; it's severe" (06/17/2013)   ODD (oppositional defiant disorder)    Partial small bowel obstruction ([place]) 11/11/2021   SBO (small bowel obstruction) ([place]) 11/11/2021   Sickle cell trait ([place])    Type I diabetes mellitus ([place])    "that's what they're thinking now" (06/17/2013)   Vision abnormalities    "takes him longer to focus cause; from his sz" (06/17/2013)    Past Surgical History:  Procedure Laterality Date   CIRCUMCISION  2000   FINGER SURGERY Left 2001   "crushed pinky; had to repair it" (06/17/2013)     reports that he has been smoking cigarettes. He has been smoking an average of .5 packs per day. He has never used smokeless tobacco. He reports current drug use. Drug: Methamphetamines. He reports that he does not drink alcohol.  Allergies  Allergen Reactions   Bee Venom Anaphylaxis   Fish Allergy Anaphylaxis   Shellfish Allergy Anaphylaxis   Tea Anaphylaxis    Family History  Problem Relation Age of Onset   Asthma Mother    COPD Mother    Other Mother        possible autoimmune, unclear   Migraines Sister        Hemiplegic Migraines  Diabetes Maternal Grandmother    Heart disease Maternal Grandmother    Hypertension Maternal Grandmother    Mental illness Maternal Grandmother    Heart disease Maternal Grandfather    Hyperlipidemia Paternal Grandmother    Hyperlipidemia Paternal Grandfather     Prior to Admission medications   Medication Sig Start Date End Date Taking? Authorizing Provider  Accu-Chek Softclix Lancets lancets Use to test blood sugars in the morning and at bed time.  11/13/21   Shelly Coss, MD  blood glucose meter kit and supplies KIT Dispense based on patient and insurance preference. Use up to four times daily as directed. (FOR ICD-9 250.00, 250.01). 11/09/20   Florencia Reasons, MD  Blood Glucose Monitoring Suppl (ACCU-CHEK GUIDE) w/Device KIT Use as directed 11/13/21   Shelly Coss, MD  Cyanocobalamin (VITAMIN B12 PO) Take 1 tablet by mouth daily.    [provider]  EPINEPHrine 0.3 mg/0.3 mL IJ SOAJ injection Inject 0.3 mg into the muscle once as needed for anaphylaxis (severe allergic reaction). 05/02/20   [provider]  FLUoxetine (PROZAC) 10 MG capsule Take 1 capsule (10 mg total) by mouth daily. Patient not taking: Reported on 12/14/2021 12/05/21   Jonetta Osgood, MD  glucose blood (ACCU-CHEK GUIDE) test strip Use to test blood sugars in the morning and bed time. 11/13/21   Shelly Coss, MD  insulin aspart (NOVOLOG FLEXPEN) 100 UNIT/ML FlexPen Inject 8 Units into the skin 3 (three) times daily with meals. Patient taking differently: Inject 10 Units into the skin 4 (four) times daily. 12/04/21   Ghimire, Henreitta Leber, MD  insulin aspart (NOVOLOG) 100 UNIT/ML injection Inject 0-20 Units into the skin 3 (three) times daily with meals. 12/19/21   Mercy Riding, MD  insulin glargine (LANTUS) 100 UNIT/ML Solostar Pen Inject 35 Units into the skin in the morning and at bedtime. Patient taking differently: Inject 40 Units into the skin in the morning and at bedtime. 12/04/21   Ghimire, Henreitta Leber, MD  Insulin Pen Needle 32G X 4 MM MISC Use as directed 12/04/21   Ghimire, Henreitta Leber, MD  Insulin Pen Needle 32G X 4 MM MISC Use to inject insulin 3 times daily as directed. 12/04/21   Ghimire, Henreitta Leber, MD  lisinopril (ZESTRIL) 10 MG tablet Take 1 tablet (10 mg total) by mouth daily. Patient not taking: Reported on 12/14/2021 12/05/21   Jonetta Osgood, MD  ondansetron (ZOFRAN) 4 MG tablet Take 4 mg by mouth every 8 (eight) hours as needed for nausea or  vomiting.    [provider]  QUEtiapine (SEROQUEL) 25 MG tablet Take 75 mg by mouth at bedtime.    [provider]  sertraline (ZOLOFT) 100 MG tablet Take 100 mg by mouth daily.    [provider]  tamsulosin (FLOMAX) 0.4 MG CAPS capsule Take 0.4 mg by mouth daily.    [provider]    Physical Exam: Vitals:   04/13/22 2300 04/13/22 2345 04/14/22 0030 04/14/22 0100  BP: 130/85 115/78 (!) 141/95 (!) 135/90  Pulse: (!) 103 (!) 103 90 84  Resp: _0 Temp:  98.5 F (36.9 C)    TempSrc:      SpO2: 100% 100% 100% 100%  Weight:      Height:        Physical Exam Vitals reviewed.  Constitutional:      General: He is not in acute distress.    Comments: Physical examination limited due to lack of  patient cooperation  HENT:     Head: Normocephalic and atraumatic.  Eyes:     Comments: Patient refused to open his eyes  Cardiovascular:     Rate and Rhythm: Normal rate and regular rhythm.     Pulses: Normal pulses.  Pulmonary:     Effort: Pulmonary effort is normal. No respiratory distress.     Breath sounds: Normal breath sounds. No wheezing or rales.  Abdominal:     Comments: Patient refused examination of his abdomen  Musculoskeletal:        General: No swelling or tenderness.  Skin:    General: Skin is warm and dry.  Neurological:     Mental Status: He is alert.     Comments: Awake and alert Moving all extremities spontaneously, no focal weakness     Labs on Admission: I have personally reviewed following labs and imaging studies  CBC: Recent Labs  Lab 04/13/22 2007 04/13/22 2038  WBC 13.7*  --   NEUTROABS 9.3*  --   HGB 14.5 16.7  HCT 47.0 49.0  MCV 89.5  --   PLT 505*  --    Basic Metabolic Panel: Recent Labs  Lab 04/13/22 0018 04/13/22 2007 04/13/22 2038  NA 139 132* 131*  K 4.1 5.2* 5.4*  CL 107 95* 103  CO2 14* <7*  --   GLUCOSE 256* 673* 664*  BUN _0 CREATININE 1.03 1.30* 0.80  CALCIUM 8.9 9.0   --    GFR: Estimated Creatinine Clearance: 150.1 mL/min (by C-G formula based on SCr of 0.8 mg/dL). Liver Function Tests: Recent Labs  Lab 04/13/22 2007  AST 140*  ALT 319*  ALKPHOS 144*  BILITOT 2.4*  PROT 9.1*  ALBUMIN 4.1   Recent Labs  Lab 04/13/22 2050  LIPASE 24   Recent Labs  Lab 04/13/22 2014  AMMONIA 20   Coagulation Profile: No results for input(s): "INR", "PROTIME" in the last 168 hours. Cardiac Enzymes: No results for input(s): "CKTOTAL", "CKMB", "CKMBINDEX", "TROPONINI" in the last 168 hours. BNP (last 3 results) No results for input(s): "PROBNP" in the last 8760 hours. HbA1C: No results for input(s): "HGBA1C" in the last 72 hours. CBG: Recent Labs  Lab 04/13/22 2134 04/13/22 2208 04/13/22 2257 04/14/22 0009 04/14/22 0136  GLUCAP 572* 494* 351* 247* 187*   Lipid Profile: No results for input(s): "CHOL", "HDL", "LDLCALC", "TRIG", "CHOLHDL", "LDLDIRECT" in the last 72 hours. Thyroid Function Tests: No results for input(s): "TSH", "T4TOTAL", "FREET4", "T3FREE", "THYROIDAB" in the last 72 hours. Anemia Panel: No results for input(s): "VITAMINB12", "FOLATE", "FERRITIN", "TIBC", "IRON", "RETICCTPCT" in the last 72 hours. Urine analysis:    Component Value Date/Time   COLORURINE COLORLESS (A) 04/13/2022 2008   APPEARANCEUR CLEAR 04/13/2022 2008   LABSPEC 1.017 04/13/2022 2008   PHURINE 5.0 04/13/2022 2008   GLUCOSEU >=500 (A) 04/13/2022 2008   HGBUR NEGATIVE 04/13/2022 2008   [place] NEGATIVE 04/13/2022 2008   KETONESUR 80 (A) 04/13/2022 2008   PROTEINUR NEGATIVE 04/13/2022 2008   UROBILINOGEN 0.2 09/18/2019 1339   NITRITE NEGATIVE 04/13/2022 2008   LEUKOCYTESUR NEGATIVE 04/13/2022 2008    Radiological Exams on Admission: CT CHEST ABDOMEN PELVIS W CONTRAST  Result Date: 04/13/2022 CLINICAL DATA:  Abdominal and chest pain.  Elevated blood glucose. EXAM: CT CHEST, ABDOMEN, AND PELVIS WITH CONTRAST TECHNIQUE: Multidetector CT imaging of the  chest, abdomen and pelvis was performed following the standard protocol during bolus administration of intravenous contrast. RADIATION DOSE REDUCTION: This exam was performed  according to the departmental dose-optimization program which includes automated exposure control, adjustment of the mA and/or kV according to patient size and/or use of iterative reconstruction technique. CONTRAST:  165m OMNIPAQUE IOHEXOL 300 MG/ML  SOLN COMPARISON:  CT examination dated Nov 04, 2021 FINDINGS: CT CHEST FINDINGS Cardiovascular: No significant vascular findings. Normal heart size. No pericardial effusion. Mediastinum/Nodes: No enlarged mediastinal, hilar, or axillary lymph nodes. Thyroid gland, trachea, and esophagus demonstrate no significant findings. Lungs/Pleura: Lungs are clear. No pleural effusion or pneumothorax. Musculoskeletal: No chest wall mass or suspicious bone lesions identified. CT ABDOMEN PELVIS FINDINGS Hepatobiliary: Liver is mildly enlarged measuring approximately 22.3 cm in craniocaudal dimension no focal liver abnormality is seen. No gallstones, gallbladder wall thickening, or biliary dilatation. Pancreas: Unremarkable. No pancreatic ductal dilatation or surrounding inflammatory changes. Spleen: Normal in size without focal abnormality. Adrenals/Urinary Tract: Adrenal glands are unremarkable. Kidneys are normal, without renal calculi, focal lesion, or hydronephrosis. Markedly distended urinary bladder. Stomach/Bowel: Stomach is within normal limits. Appendix appears normal. No evidence of bowel wall thickening, distention, or inflammatory changes. Vascular/Lymphatic: No significant vascular findings are present. No enlarged abdominal or pelvic lymph nodes. Reproductive: Prostate is unremarkable. Other: No abdominal wall hernia or abnormality. No abdominopelvic ascites. Musculoskeletal: No acute or significant osseous findings. IMPRESSION: 1. No evidence of acute abnormality within the chest, abdomen or  pelvis. 2. Markedly distended urinary bladder. 3. Mild hepatomegaly, unchanged. Electronically Signed   By: IKeane PoliceD.O.   On: 04/13/2022 23:02   DG Chest Port 1 View  Result Date: 04/13/2022 CLINICAL DATA:  Left-sided chest pain. EXAM: PORTABLE CHEST 1 VIEW COMPARISON:  Chest radiograph dated November 30, 2021 FINDINGS: The heart size and mediastinal contours are within normal limits. Both lungs are clear. The visualized skeletal structures are unremarkable. IMPRESSION: No active disease. Electronically Signed   By: IKeane PoliceD.O.   On: 04/13/2022 20:27    EKG: Independently reviewed.  Sinus tachycardia, borderline T wave abnormalities in inferior leads, borderline ST elevation in anterior leads.  Assessment and Plan  Severe DKA in the setting of poorly controlled type 1 diabetes Patient with history of multiple psychiatric conditions and noncompliance. A1c 9.8 on 12/15/2021.  Initial labs suggestive of severe DKA: glucose 673, bicarb <7, UA with ketones, beta hydroxybutyric acid >8.0, and VBG with pH 7.0.  Patient received 2 L fluid boluses and was started on insulin infusion in the ED.  Repeat labs showing glucose 256, bicarb 14, anion gap 18.  Repeat VBG with pH 7.3. -Keep n.p.o. -Continue insulin infusion and IV fluids per DKA protocol -Repeat A1c -Monitor BMP every 4 hours -Frequent CBG checks per Endo tool  Chest pain Per ED documentation.  He refuses to speak at this time.  Troponin negative x2 and not consistent with ACS.  He is not hypoxic.  CT chest with contrast negative for acute abnormality. -Cardiac monitoring  Abdominal pain, nausea, vomiting Per ED documentation.  Patient refuses to speak at this time and refuses examination of his abdomen.  Symptoms possibly related to DKA versus ?diabetic gastroparesis.  CT abdomen pelvis with contrast negative for acute abnormality.  Not actively vomiting at this time. -Symptomatic management  AKI Likely prerenal from dehydration.   Creatinine 1.3 on initial labs, improved to 1.0 with IV fluids.  Baseline 0.4-0.5. -Continue IV fluid hydration -Monitor renal function -Avoid nephrotoxic agents  Elevated liver enzymes LFTs chronically elevated in the setting of hepatitis C.  CT showing mild stable hepatomegaly and no acute hepatobiliary abnormality. -Monitor  LFTs  Mild leukocytosis Likely reactive.  He is afebrile.  UA and CT chest/abdomen/pelvis without evidence of infection. -Repeat CBC in a.m.  Mild lactic acidosis Likely from dehydration.  No signs of infection/sepsis. -IV fluid hydration -Trend lactate  Mild thrombocytosis -Repeat CBC to confirm  Polysubstance abuse/ ?alcohol use disorder UDS positive for amphetamines which I do not see listed in his home medications but pharmacy med rec is pending at this time. Patient was started on CIWA protocol in the ED although I do not see history of alcohol abuse listed in his social history.  Continue CIWA monitoring at this time as patient refuses to speak.  He is slightly tachycardic but not displaying any other signs of withdrawal at this time.  History of epilepsy: Stable, not having seizures at this time. Hypertension: Stable. Hep C Asthma: Stable, no signs of acute exacerbation. Bipolar disorder, ADD, ODD, anxiety, depression, conversion disorder -Pharmacy med rec pending.  DVT prophylaxis: Lovenox Code Status: Full Code Family Communication: No family available at this time. Level of care: Step Down Unit Admission status: It is my clinical opinion that referral for OBSERVATION is reasonable and necessary in this patient based on the above information provided. The aforementioned taken together are felt to place the patient at high risk for further clinical deterioration. However, it is anticipated that the patient may be medically stable for discharge from the hospital within 24 to 48 hours.   Shela Leff MD [facility]  If 7PM-7AM, please  contact night-coverage [email or web address]  04/14/2022, 2:26 AM

## 2022-04-14 NOTE — Progress Notes (Signed)
PROGRESS NOTE    [name]  FHL:[id number] DOB: May 27, 2001 DOA: 04/13/2022 PCP: [facility]    Brief Narrative:   [name] [name]. is a 21 y.o. male with past medical history significant for poorly controlled type 1 diabetes, epilepsy, hypertension, hep C, Crohn's disease, previous bowel obstruction, asthma, bipolar disorder, ADD, ODD, anxiety, depression, conversion disorder, polysubstance abuse (methamphetamine, tobacco), history of suicidal ideation, history of medical noncompliance presented to ED with complaints of chest pain, abdominal pain, nausea, vomiting, and hyperglycemia.   In the ED, patient slightly tachycardic but afebrile.  Not hypotensive or hypoxic.  Labs showing WBC 13.7, platelet count 505k, sodium 132, potassium 5.2, chloride 95, glucose 673, bicarb <7, UA with ketones and not suggestive of infection, beta hydroxybutyric acid >8.0, VBG with pH 7.0, creatinine 1.3 (baseline 0.4-0.5), AST 140, ALT 319, alk phos 144, T. bili 2.4, lipase normal, high-sensitivity troponin negative x2, blood ethanol level undetectable, UDS positive for amphetamines, ammonia normal, lactic acid 2.6.  CT chest/abdomen/pelvis negative for acute abnormality.  Showing markedly distended urinary bladder and mild stable hepatomegaly.  Patient subsequently voided 1200 cc urine.  He was given Toradol, Zofran, 2 L LR boluses, and started on insulin infusion.  Repeat BMP showing sodium 139, potassium 4.1, chloride 107, bicarb 14, anion gap 18, glucose 256, creatinine 1.0.  Repeat VBG with pH 7.3.  EDP consulted [facility] for admission and further evaluation and treatment of DKA.   Assessment & Plan:   Severe DKA in the setting of poorly controlled type 1 diabetes Patient with history of multiple psychiatric conditions and noncompliance. Initial labs suggestive of severe DKA: glucose 673, bicarb <7, UA with ketones, beta hydroxybutyric acid >8.0, and VBG with pH 7.0.  Patient  received 2 L fluid boluses and was started on insulin infusion in the ED.  Repeat labs showing glucose 256, bicarb 14, anion gap 18.  Repeat VBG with pH 7.3. --Hemoglobin A1c 9.3, poorly controlled --AG 18>14>10 --Transition with Semglee 20 units [place] BID --NovoLog 3 units [place] TIDAC if eating >50% meals --moderate SSI for coverage --CBGs qAC/HS --Continue insulin drip for 2 hours following transition and then discontinue --Once off insulin drip, will transfer to med/surg bed   Chest pain, noncardiac Per ED documentation.  He refuses to speak at this time.  Troponin negative x2 and not consistent with ACS.  He is not hypoxic.  CT chest with contrast negative for acute abnormality.   Abdominal pain, nausea, vomiting Per ED documentation.  Patient refuses to speak at this time and refuses examination of his abdomen.  Symptoms possibly related to DKA versus ?diabetic gastroparesis.  CT abdomen pelvis with contrast negative for acute abnormality. Not actively vomiting at this time. --Supportive care, antiemetics   AKI: Resolved Likely prerenal from dehydration.  Creatinine 1.3 on initial labs, improved to 1.0 with IV fluids.  Baseline 0.4-0.5.  Started on IV fluid hydration. --Cr 1.3>>0.66 --Discontinue IV fluids  Elevated liver enzymes Hx hepatitis C LFTs chronically elevated in the setting of hepatitis C.  CT showing mild stable hepatomegaly and no acute hepatobiliary abnormality.   Mild leukocytosis Likely reactive.  He is afebrile.  UA and CT chest/abdomen/pelvis without evidence of infection. -Repeat CBC in a.m.   Mild lactic acidosis Likely from dehydration.  No signs of infection/sepsis.  Aggressively hydrated during hospitalization. --Lactic acid 2.6 --Repeat lactic acid in the a.m.   Mild thrombocytosis: Resolved --Plt 505>393   Polysubstance abuse/ ?alcohol use disorder UDS positive  for amphetamines which I do not see listed in his home medications but pharmacy med rec is  pending at this time. Patient was started on CIWA protocol in the ED although I do not see history of alcohol abuse listed in his social history.  Continue CIWA monitoring at this time as patient refuses to speak.  He is slightly tachycardic but not displaying any other signs of withdrawal at this time.   History of epilepsy: Stable, not having seizures at this time.  Hypertension: Stable.  Asthma: Stable, no signs of acute exacerbation.  Bipolar disorder, ADD, ODD, anxiety, depression, conversion disorder --Seroquel 75 mg p.o. nightly, sertraline 100 mg p.o. daily   DVT prophylaxis: enoxaparin (LOVENOX) injection 40 mg Start: 04/14/22 1000    Code Status: Full Code Family Communication: No family present at bedside  Disposition Plan:  Level of care: Stepdown Status is: Observation The patient will require care spanning > 2 midnights and should be moved to inpatient because: Insulin drip, needs further titration of diabetic regimen    Consultants:  None  Procedures:  None   Antimicrobials:  None   Subjective: Patient seen examined bedside, resting comfortably.  Sleeping but arousable.  Poorly communicative.  No complaints.  Remains on insulin drip.  Hopeful for transition soon to subcutaneous insulin.  No acute concerns per RN overnight  Objective: Vitals:   04/14/22 0530 04/14/22 0801 04/14/22 1100 04/14/22 1158  BP: 138/86 117/80 116/74 118/70  Pulse: 97 81 84 99  Resp: '17 15 17 18  ' Temp:  97.8 F (36.6 C)  97.7 F (36.5 C)  TempSrc:  Oral  Oral  SpO2: 100% 100% 100% 100%  Weight:      Height:        Intake/Output Summary (Last 24 hours) at 04/14/2022 1353 Last data filed at 04/14/2022 0227 Gross per 24 hour  Intake 3000 ml  Output 1200 ml  Net 1800 ml   Filed Weights   04/13/22 2001  Weight: 73.9 kg    Examination:  Physical Exam: GEN: NAD, alert and oriented x 3, appears older than stated age HEENT: NCAT, PERRL, EOMI, sclera clear, dry mucous  membranes PULM: CTAB w/o wheezes/crackles, normal respiratory effort, room air CV: RRR w/o M/G/R GI: abd soft, NTND, + BS MSK: no peripheral edema, moves all extremities independently NEURO: No focal neurological deficit PSYCH: Depressed mood, flat affect Integumentary: No appreciable rashes/wounds/lesions noted on exposed skin surfaces    Data Reviewed: I have personally reviewed following labs and imaging studies  CBC: Recent Labs  Lab 04/13/22 2007 04/13/22 2038 04/14/22 0355  WBC 13.7*  --  10.6*  NEUTROABS 9.3*  --   --   HGB 14.5 16.7 11.3*  HCT 47.0 49.0 34.1*  MCV 89.5  --  84.4  PLT 505*  --  270   Basic Metabolic Panel: Recent Labs  Lab 04/13/22 0018 04/13/22 2007 04/13/22 2038 04/14/22 0355 04/14/22 1030  NA 139 132* 131* 135 136  K 4.1 5.2* 5.4* 3.7 3.5  CL 107 95* 103 106 109  CO2 14* <7*  --  15* 21*  GLUCOSE 256* 673* 664* 212* 104*  BUN '17 19 18 13 10  ' CREATININE 1.03 1.30* 0.80 0.82 0.66  CALCIUM 8.9 9.0  --  8.1* 8.0*   GFR: Estimated Creatinine Clearance: 150.1 mL/min (by C-G formula based on SCr of 0.66 mg/dL). Liver Function Tests: Recent Labs  Lab 04/13/22 2007 04/14/22 0355  AST 140* 78*  ALT 319* 205*  ALKPHOS  144* 93  BILITOT 2.4* 1.5*  PROT 9.1* 6.5  ALBUMIN 4.1 3.0*   Recent Labs  Lab 04/13/22 2050  LIPASE 24   Recent Labs  Lab 04/13/22 2014  AMMONIA 20   Coagulation Profile: No results for input(s): "INR", "PROTIME" in the last 168 hours. Cardiac Enzymes: No results for input(s): "CKTOTAL", "CKMB", "CKMBINDEX", "TROPONINI" in the last 168 hours. BNP (last 3 results) No results for input(s): "PROBNP" in the last 8760 hours. HbA1C: Recent Labs    04/14/22 0355  HGBA1C 9.3*   CBG: Recent Labs  Lab 04/14/22 0601 04/14/22 0721 04/14/22 0955 04/14/22 1157 04/14/22 1250  GLUCAP 176* 156* 106* 210* 206*   Lipid Profile: No results for input(s): "CHOL", "HDL", "LDLCALC", "TRIG", "CHOLHDL", "LDLDIRECT" in the  last 72 hours. Thyroid Function Tests: No results for input(s): "TSH", "T4TOTAL", "FREET4", "T3FREE", "THYROIDAB" in the last 72 hours. Anemia Panel: No results for input(s): "VITAMINB12", "FOLATE", "FERRITIN", "TIBC", "IRON", "RETICCTPCT" in the last 72 hours. Sepsis Labs: Recent Labs  Lab 04/13/22 2015  LATICACIDVEN 2.6*    No results found for this or any previous visit (from the past 240 hour(s)).       Radiology Studies: CT CHEST ABDOMEN PELVIS W CONTRAST  Result Date: 04/13/2022 CLINICAL DATA:  Abdominal and chest pain.  Elevated blood glucose. EXAM: CT CHEST, ABDOMEN, AND PELVIS WITH CONTRAST TECHNIQUE: Multidetector CT imaging of the chest, abdomen and pelvis was performed following the standard protocol during bolus administration of intravenous contrast. RADIATION DOSE REDUCTION: This exam was performed according to the departmental dose-optimization program which includes automated exposure control, adjustment of the mA and/or kV according to patient size and/or use of iterative reconstruction technique. CONTRAST:  154m OMNIPAQUE IOHEXOL 300 MG/ML  SOLN COMPARISON:  CT examination dated Nov 04, 2021 FINDINGS: CT CHEST FINDINGS Cardiovascular: No significant vascular findings. Normal heart size. No pericardial effusion. Mediastinum/Nodes: No enlarged mediastinal, hilar, or axillary lymph nodes. Thyroid gland, trachea, and esophagus demonstrate no significant findings. Lungs/Pleura: Lungs are clear. No pleural effusion or pneumothorax. Musculoskeletal: No chest wall mass or suspicious bone lesions identified. CT ABDOMEN PELVIS FINDINGS Hepatobiliary: Liver is mildly enlarged measuring approximately 22.3 cm in craniocaudal dimension no focal liver abnormality is seen. No gallstones, gallbladder wall thickening, or biliary dilatation. Pancreas: Unremarkable. No pancreatic ductal dilatation or surrounding inflammatory changes. Spleen: Normal in size without focal abnormality.  Adrenals/Urinary Tract: Adrenal glands are unremarkable. Kidneys are normal, without renal calculi, focal lesion, or hydronephrosis. Markedly distended urinary bladder. Stomach/Bowel: Stomach is within normal limits. Appendix appears normal. No evidence of bowel wall thickening, distention, or inflammatory changes. Vascular/Lymphatic: No significant vascular findings are present. No enlarged abdominal or pelvic lymph nodes. Reproductive: Prostate is unremarkable. Other: No abdominal wall hernia or abnormality. No abdominopelvic ascites. Musculoskeletal: No acute or significant osseous findings. IMPRESSION: 1. No evidence of acute abnormality within the chest, abdomen or pelvis. 2. Markedly distended urinary bladder. 3. Mild hepatomegaly, unchanged. Electronically Signed   By: IKeane PoliceD.O.   On: 04/13/2022 23:02   DG Chest Port 1 View  Result Date: 04/13/2022 CLINICAL DATA:  Left-sided chest pain. EXAM: PORTABLE CHEST 1 VIEW COMPARISON:  Chest radiograph dated November 30, 2021 FINDINGS: The heart size and mediastinal contours are within normal limits. Both lungs are clear. The visualized skeletal structures are unremarkable. IMPRESSION: No active disease. Electronically Signed   By: IKeane PoliceD.O.   On: 04/13/2022 20:27        Scheduled Meds:  enoxaparin (LOVENOX) injection  40 mg Subcutaneous U07K   folic acid  1 mg Oral Daily   insulin aspart  0-15 Units Subcutaneous TID WC   insulin aspart  0-5 Units Subcutaneous QHS   insulin aspart  3 Units Subcutaneous TID WC   insulin glargine-yfgn  20 Units Subcutaneous BID   multivitamin with minerals  1 tablet Oral Daily   thiamine  100 mg Oral Daily   Or   thiamine  100 mg Intravenous Daily   Continuous Infusions:   LOS: 0 days    Time spent: 48 minutes spent on chart review, discussion with nursing staff, consultants, updating family and interview/physical exam; more than 50% of that time was spent in counseling and/or coordination of  care.    Chandrika Sandles J [place] DO [facility] Available via Epic secure chat 7am-7pm After these hours, please refer to coverage provider listed on [email or web address] 04/14/2022, 1:53 PM

## 2022-04-14 NOTE — Progress Notes (Signed)
Left AMA at [place].  Unable to persuade pt to stay  ON call/AC aware

## 2022-04-15 NOTE — Discharge Summary (Signed)
[facility] Physician Discharge Summary  [name] [name]. [id number]:[id number] DOB: 2000/12/19 DOA: 04/13/2022  PCP: [facility] [place] date: 04/13/2022 Discharge date: 04/15/2022  Admitted From: Home Disposition: Patient left AMA   History of present illness:  [name] Martelli. is a 21 y.o. male with past medical history significant for poorly controlled type 1 diabetes, epilepsy, hypertension, hep C, Crohn's disease, previous bowel obstruction, asthma, bipolar disorder, ADD, ODD, anxiety, depression, conversion disorder, polysubstance abuse (methamphetamine, tobacco), history of suicidal ideation, history of medical noncompliance presented to ED with complaints of chest pain, abdominal pain, nausea, vomiting, and hyperglycemia.    In the ED, patient slightly tachycardic but afebrile.  Not hypotensive or hypoxic.  Labs showing WBC 13.7, platelet count 505k, sodium 132, potassium 5.2, chloride 95, glucose 673, bicarb <7, UA with ketones and not suggestive of infection, beta hydroxybutyric acid >8.0, VBG with pH 7.0, creatinine 1.3 (baseline 0.4-0.5), AST 140, ALT 319, alk phos 144, T. bili 2.4, lipase normal, high-sensitivity troponin negative x2, blood ethanol level undetectable, UDS positive for amphetamines, ammonia normal, lactic acid 2.6.  CT chest/abdomen/pelvis negative for acute abnormality.  Showing markedly distended urinary bladder and mild stable hepatomegaly.  Patient subsequently voided 1200 cc urine.  He was given Toradol, Zofran, 2 L LR boluses, and started on insulin infusion.  Repeat BMP showing sodium 139, potassium 4.1, chloride 107, bicarb 14, anion gap 18, glucose 256, creatinine 1.0.  Repeat VBG with pH 7.3.  EDP consulted [facility] for admission and further evaluation and treatment of DKA.   Hospital course:  Severe DKA in the setting of poorly controlled type 1 diabetes Patient with history of multiple psychiatric conditions and noncompliance. Initial labs  suggestive of severe DKA: glucose 673, bicarb <7, UA with ketones, beta hydroxybutyric acid >8.0, and VBG with pH 7.0.  Patient received 2 L fluid boluses and was started on insulin infusion in the ED.  Repeat labs showing glucose 256, bicarb 14, anion gap 18.  Repeat VBG with pH 7.3. Hemoglobin A1c 9.3, poorly controlled.  Patient was started on insulin drip and was transitioned to insulin glargine.  Patient unfortunately left AMA which is his typical behavior.   Chest pain, noncardiac Per ED documentation.  He refuses to speak at this time.  Troponin negative x2 and not consistent with ACS.  He is not hypoxic.  CT chest with contrast negative for acute abnormality.   Abdominal pain, nausea, vomiting Per ED documentation.  Patient refuses to speak at this time and refuses examination of his abdomen.  Symptoms possibly related to DKA versus ?diabetic gastroparesis.  CT abdomen pelvis with contrast negative for acute abnormality. Not actively vomiting at this time.   AKI: Resolved Likely prerenal from dehydration.  Creatinine 1.3 on initial labs, improved to 1.0 with IV fluids.  Baseline 0.4-0.5.  Started on IV fluid hydration.  Creatinine improved to 0.66.   Elevated liver enzymes Hx hepatitis C LFTs chronically elevated in the setting of hepatitis C.  CT showing mild stable hepatomegaly and no acute hepatobiliary abnormality.   Mild leukocytosis Likely reactive.  He is afebrile.  UA and CT chest/abdomen/pelvis without evidence of infection.   Mild lactic acidosis Likely from dehydration.  No signs of infection/sepsis.  Aggressively hydrated during hospitalization.   Mild thrombocytosis: Resolved --Plt 505>393   Polysubstance abuse/methamphetamine abuse/alcohol use disorder UDS positive for amphetamines which I do not see listed in his home medications but likely secondary to crystal methamphetamine use.   History  of epilepsy: Stable, not having seizures at this time.   Hypertension:  Stable.   Asthma: Stable, no signs of acute exacerbation.   Bipolar disorder, ADD, ODD, anxiety, depression, conversion disorder Seroquel 75 mg p.o. nightly, sertraline 100 mg p.o. daily  Self-neglect, medical noncompliance with multiple hospitalizations Overall long-term prognosis very poor given patient's self-neglect and nonadherence to medical recommendations requiring multiple hospitalizations.  Discharge Diagnoses:  Principal Problem:   DKA (diabetic ketoacidosis) ([place]) Active Problems:   Uncontrolled type 1 diabetes mellitus with hyperglycemia, with long-term current use of insulin ([facility])   Asthma   Abdominal pain   Anxiety   MDD (major depressive disorder)   Bipolar disorder ([place])   Polysubstance abuse ([place])   Conversion disorder   ADD (attention deficit disorder)    Discharge Instructions   Allergies as of 04/14/2022       Reactions   Bee Venom Anaphylaxis   Fish Allergy Anaphylaxis   Shellfish Allergy Anaphylaxis   Tea Anaphylaxis        Medication List     ASK your doctor about these medications    EPINEPHrine 0.3 mg/0.3 mL Soaj injection Commonly known as: EPI-PEN Inject 0.3 mg into the muscle once as needed for anaphylaxis (severe allergic reaction).   insulin glargine 100 UNIT/ML Solostar Pen Commonly known as: LANTUS Inject 35 Units into the skin in the morning and at bedtime.   lisinopril 10 MG tablet Commonly known as: ZESTRIL Take 1 tablet (10 mg total) by mouth daily.   multivitamin with minerals Tabs tablet Take 1 tablet by mouth daily.   NovoLOG FlexPen 100 UNIT/ML FlexPen Generic drug: insulin aspart Inject 8 Units into the skin 3 (three) times daily with meals.   insulin aspart 100 UNIT/ML injection Commonly known as: novoLOG Inject 0-20 Units into the skin 3 (three) times daily with meals.   QUEtiapine 25 MG tablet Commonly known as: SEROQUEL Take 75 mg by mouth at bedtime.   sertraline 100 MG tablet Commonly known as:  ZOLOFT Take 100 mg by mouth daily.   tamsulosin 0.4 MG Caps capsule Commonly known as: FLOMAX Take 0.4 mg by mouth daily.   VITAMIN B12 PO Take 1 tablet by mouth daily.        Allergies  Allergen Reactions   Bee Venom Anaphylaxis   Fish Allergy Anaphylaxis   Shellfish Allergy Anaphylaxis   Tea Anaphylaxis    Consultations: None   Procedures/Studies: CT CHEST ABDOMEN PELVIS W CONTRAST  Result Date: 04/13/2022 CLINICAL DATA:  Abdominal and chest pain.  Elevated blood glucose. EXAM: CT CHEST, ABDOMEN, AND PELVIS WITH CONTRAST TECHNIQUE: Multidetector CT imaging of the chest, abdomen and pelvis was performed following the standard protocol during bolus administration of intravenous contrast. RADIATION DOSE REDUCTION: This exam was performed according to the departmental dose-optimization program which includes automated exposure control, adjustment of the mA and/or kV according to patient size and/or use of iterative reconstruction technique. CONTRAST:  160m OMNIPAQUE IOHEXOL 300 MG/ML  SOLN COMPARISON:  CT examination dated Nov 04, 2021 FINDINGS: CT CHEST FINDINGS Cardiovascular: No significant vascular findings. Normal heart size. No pericardial effusion. Mediastinum/Nodes: No enlarged mediastinal, hilar, or axillary lymph nodes. Thyroid gland, trachea, and esophagus demonstrate no significant findings. Lungs/Pleura: Lungs are clear. No pleural effusion or pneumothorax. Musculoskeletal: No chest wall mass or suspicious bone lesions identified. CT ABDOMEN PELVIS FINDINGS Hepatobiliary: Liver is mildly enlarged measuring approximately 22.3 cm in craniocaudal dimension no focal liver abnormality is seen. No gallstones, gallbladder wall thickening, or biliary  dilatation. Pancreas: Unremarkable. No pancreatic ductal dilatation or surrounding inflammatory changes. Spleen: Normal in size without focal abnormality. Adrenals/Urinary Tract: Adrenal glands are unremarkable. Kidneys are normal,  without renal calculi, focal lesion, or hydronephrosis. Markedly distended urinary bladder. Stomach/Bowel: Stomach is within normal limits. Appendix appears normal. No evidence of bowel wall thickening, distention, or inflammatory changes. Vascular/Lymphatic: No significant vascular findings are present. No enlarged abdominal or pelvic lymph nodes. Reproductive: Prostate is unremarkable. Other: No abdominal wall hernia or abnormality. No abdominopelvic ascites. Musculoskeletal: No acute or significant osseous findings. IMPRESSION: 1. No evidence of acute abnormality within the chest, abdomen or pelvis. 2. Markedly distended urinary bladder. 3. Mild hepatomegaly, unchanged. Electronically Signed   By: [name] Police D.O.   On: 04/13/2022 23:02   DG Chest Port 1 View  Result Date: 04/13/2022 CLINICAL DATA:  Left-sided chest pain. EXAM: PORTABLE CHEST 1 VIEW COMPARISON:  Chest radiograph dated November 30, 2021 FINDINGS: The heart size and mediastinal contours are within normal limits. Both lungs are clear. The visualized skeletal structures are unremarkable. IMPRESSION: No active disease. Electronically Signed   By: [name] Police D.O.   On: 04/13/2022 20:27     Subjective: Patient left AMA  Discharge Exam: Vitals:   04/14/22 1520 04/14/22 1725  BP:  (!) 142/90  Pulse:  92  Resp:  18  Temp: 97.7 F (36.5 C) 97.8 F (36.6 C)  SpO2:  100%   Vitals:   04/14/22 1100 04/14/22 1158 04/14/22 1520 04/14/22 1725  BP: 116/74 118/70  (!) 142/90  Pulse: 84 99  92  Resp: '17 18  18  ' Temp:  97.7 F (36.5 C) 97.7 F (36.5 C) 97.8 F (36.6 C)  TempSrc:  Oral  Oral  SpO2: 100% 100%  100%  Weight:      Height:          The results of significant diagnostics from this hospitalization (including imaging, microbiology, ancillary and laboratory) are listed below for reference.     Microbiology: No results found for this or any previous visit (from the past 240 hour(s)).   Labs: BNP (last 3 results) No  results for input(s): "BNP" in the last 8760 hours. Basic Metabolic Panel: Recent Labs  Lab 04/13/22 0018 04/13/22 2007 04/13/22 2038 04/14/22 0355 04/14/22 1030  NA 139 132* 131* 135 136  K 4.1 5.2* 5.4* 3.7 3.5  CL 107 95* 103 106 109  CO2 14* <7*  --  15* 21*  GLUCOSE 256* 673* 664* 212* 104*  BUN '17 19 18 13 10  ' CREATININE 1.03 1.30* 0.80 0.82 0.66  CALCIUM 8.9 9.0  --  8.1* 8.0*   Liver Function Tests: Recent Labs  Lab 04/13/22 2007 04/14/22 0355  AST 140* 78*  ALT 319* 205*  ALKPHOS 144* 93  BILITOT 2.4* 1.5*  PROT 9.1* 6.5  ALBUMIN 4.1 3.0*   Recent Labs  Lab 04/13/22 2050  LIPASE 24   Recent Labs  Lab 04/13/22 2014  AMMONIA 20   CBC: Recent Labs  Lab 04/13/22 2007 04/13/22 2038 04/14/22 0355  WBC 13.7*  --  10.6*  NEUTROABS 9.3*  --   --   HGB 14.5 16.7 11.3*  HCT 47.0 49.0 34.1*  MCV 89.5  --  84.4  PLT 505*  --  393   Cardiac Enzymes: No results for input(s): "CKTOTAL", "CKMB", "CKMBINDEX", "TROPONINI" in the last 168 hours. BNP: Invalid input(s): "POCBNP" CBG: Recent Labs  Lab 04/14/22 0955 04/14/22 1157 04/14/22 1250 04/14/22 1444 04/14/22 1652  GLUCAP 106* 210* 206* 155* 219*   D-Dimer No results for input(s): "DDIMER" in the last 72 hours. Hgb A1c Recent Labs    04/14/22 0355  HGBA1C 9.3*   Lipid Profile No results for input(s): "CHOL", "HDL", "LDLCALC", "TRIG", "CHOLHDL", "LDLDIRECT" in the last 72 hours. Thyroid function studies No results for input(s): "TSH", "T4TOTAL", "T3FREE", "THYROIDAB" in the last 72 hours.  Invalid input(s): "FREET3" Anemia work up No results for input(s): "VITAMINB12", "FOLATE", "FERRITIN", "TIBC", "IRON", "RETICCTPCT" in the last 72 hours. Urinalysis    Component Value Date/Time   COLORURINE COLORLESS (A) 04/13/2022 2008   APPEARANCEUR CLEAR 04/13/2022 2008   LABSPEC 1.017 04/13/2022 2008   PHURINE 5.0 04/13/2022 2008   GLUCOSEU >=500 (A) 04/13/2022 2008   HGBUR NEGATIVE 04/13/2022  2008   [place] NEGATIVE 04/13/2022 2008   KETONESUR 80 (A) 04/13/2022 2008   PROTEINUR NEGATIVE 04/13/2022 2008   UROBILINOGEN 0.2 09/18/2019 1339   NITRITE NEGATIVE 04/13/2022 2008   LEUKOCYTESUR NEGATIVE 04/13/2022 2008   Sepsis Labs Recent Labs  Lab 04/13/22 2007 04/14/22 0355  WBC 13.7* 10.6*   Microbiology No results found for this or any previous visit (from the past 240 hour(s)).   Time coordinating discharge: Over 30 minutes  SIGNED:   Donnamarie Poag [place] DO  [facility] 04/15/2022, 1:05 PM

## 2022-07-02 ENCOUNTER — Encounter: Payer: Self-pay | Admitting: Family Medicine

## 2022-07-02 ENCOUNTER — Ambulatory Visit: Payer: Medicaid Other | Admitting: Family Medicine

## 2022-07-02 ENCOUNTER — Ambulatory Visit (INDEPENDENT_AMBULATORY_CARE_PROVIDER_SITE_OTHER): Payer: Medicaid Other | Admitting: Family Medicine

## 2022-07-02 VITALS — BP 135/96 | HR 114 | Ht 70.0 in | Wt 161.1 lb

## 2022-07-02 DIAGNOSIS — G40309 Generalized idiopathic epilepsy and epileptic syndromes, not intractable, without status epilepticus: Secondary | ICD-10-CM | POA: Diagnosis not present

## 2022-07-02 DIAGNOSIS — R1013 Epigastric pain: Secondary | ICD-10-CM

## 2022-07-02 DIAGNOSIS — Z5900 Homelessness unspecified: Secondary | ICD-10-CM | POA: Insufficient documentation

## 2022-07-02 DIAGNOSIS — E1065 Type 1 diabetes mellitus with hyperglycemia: Secondary | ICD-10-CM

## 2022-07-02 DIAGNOSIS — B182 Chronic viral hepatitis C: Secondary | ICD-10-CM

## 2022-07-02 MED ORDER — INSULIN GLARGINE 100 UNIT/ML SOLOSTAR PEN: 30.0000 [IU] | PEN_INJECTOR | Freq: Two times a day (BID) | SUBCUTANEOUS | 3 refills | Status: DC

## 2022-07-02 MED ORDER — NOVOLOG FLEXPEN 100 UNIT/ML ~~LOC~~ SOPN: 0.0000 [IU] | PEN_INJECTOR | Freq: Three times a day (TID) | SUBCUTANEOUS | 11 refills | Status: DC

## 2022-07-02 MED ORDER — ACCU-CHEK GUIDE W/DEVICE KIT: 1.0000 | PACK | Freq: Every day | 0 refills | Status: DC

## 2022-07-02 MED ORDER — ACCU-CHEK GUIDE VI STRP
ORAL_STRIP | 12 refills | Status: DC
  Filled 2022-07-08 – 2022-09-26 (×2): qty 100, 25d supply, fill #0

## 2022-07-02 MED ORDER — ACCU-CHEK SOFTCLIX LANCETS MISC: 12 refills | Status: DC

## 2022-07-02 NOTE — Patient Instructions (Addendum)
I have put in referrals to infectious disease as well as neurology.  I placed orders for your glucose supplies, if you have difficulties with getting these please let me know. If you are able to get cash for over the counter strips and such, the cheapest place to get them is [facility].  I want to get labs today and I want you to come back to see me for follow-up in 2 weeks.

## 2022-07-02 NOTE — Assessment & Plan Note (Signed)
Homeless with limited food supply, which complicates picture of type 1 diabetes. - Social work referral placed

## 2022-07-02 NOTE — Assessment & Plan Note (Signed)
Patient previously diagnosed around age 22 per chart review.  Reports previously having been on Topamax as a child.  Has not been on any medications, patient is unclear when he had exact last seizure but thinks it was 2 months ago. - Referral to neurology placed

## 2022-07-02 NOTE — Assessment & Plan Note (Signed)
Patient with poorly controlled type 1 diabetes, last A1c was 9.32 months prior.  Currently on Lantus and NovoLog.  Has issue with noncompliance in the past and continued drug abuse per chart review. - Repeat A1c in 1 month - Refill of Lantus 30 units twice daily and NovoLog 20 units with meals - Insulin supplies ordered and provided in clinic - Anticipate that this will be difficult to manage given patient's recurrent history of admissions

## 2022-07-02 NOTE — Assessment & Plan Note (Signed)
Reports abdominal pain in the last several days that has worsened.  History of hepatitis C, see below.  Due to entirety of presentation, unable to fully assess or evaluate this. - Follow-up in 2 weeks

## 2022-07-02 NOTE — Assessment & Plan Note (Signed)
Previously noted LFT elevations on prior hospitalizations.  Diagnosed with hepatitis C 2 years ago per patient after sharing syringes for his insulin. - Referral to infectious disease placed - CMP today

## 2022-07-02 NOTE — Progress Notes (Signed)
Subjective:    Patient ID: [name] Bread., male    DOB: 03-12-2001, 22 y.o.   MRN: [id number]   CC: New Patient  HPI:  Type 1 diabetes - Needs glucometer with supplies as well as insulin prescriptions - Hospitalized multiple times for DKA, most recently last month in [place] - Currently taking Lantus 30 units twice daily and NovoLog 20 units with meals  Right eye pain - Reports it has been hurting for several weeks - Has a lot of twitching in the eye - Thinks it may be related to wearing his contacts - Needs an optometry/ophthalmology evaluation  Stomach pain - Has been having increasing stomach pain for the last 3 to 4 days - Was diagnosed with hepatitis C 2 years ago and never followed up with ID due to social issues  Epilepsy  reported Tourette's syndrome - Reports that last seizure may have been 2 months prior - Was on Topamax as a child - Has not been on anything for medications in quite some time - Used to follow with Dr. Gaynell Face in [place] he was diagnosed with Tourette's within the last year  Mental health - Reports previously being on quetiapine and Haldol - Does not have a mental health care provider at this time  PMHx: Past Medical History:  Diagnosis Date   ADD (attention deficit disorder)    Allergy    Anxiety    Asthma    Depression    DKA (diabetic ketoacidosis) ([place]) 11/10/2020   Epileptic seizure ([place])    "both petit and grand mal; last sz ~ 2 wk ago" (06/17/2013)   Fracture of multiple ribs 05/11/2020   Headache(784.0)    "2-3 times/wk usually" (06/17/2013)   Heart murmur    "heard a slight one earlier today" (06/17/2013)   Homeless    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   Migraine    "maybe once/month; it's severe" (06/17/2013)   Migraine without status migrainosus, not intractable 12/13/2013   ODD (oppositional defiant disorder)    Partial small bowel obstruction ([place]) 11/11/2021   SBO (small bowel  obstruction) ([place]) 11/11/2021   Sickle cell trait ([place])    Suicidal ideation 10/06/2019   Suicidal ideations 03/31/2014   Type I diabetes mellitus ([place])    "that's what they're thinking now" (06/17/2013)   Vision abnormalities    "takes him longer to focus cause; from his sz" (06/17/2013)     Surgical Hx: Past Surgical History:  Procedure Laterality Date   CIRCUMCISION  2000   FINGER SURGERY Left 2001   "crushed pinky; had to repair it" (06/17/2013)     Family Hx: Family History  Problem Relation Age of Onset   Asthma Mother    COPD Mother    Other Mother        possible autoimmune, unclear   Migraines Sister        Hemiplegic Migraines    Diabetes Maternal Grandmother    Heart disease Maternal Grandmother    Hypertension Maternal Grandmother    Mental illness Maternal Grandmother    Heart disease Maternal Grandfather    Hyperlipidemia Paternal Grandmother    Hyperlipidemia Paternal Grandfather      Social Hx: Currently homeless Has issues with transportation and money   Medications: Lantus - 30 BID Novolog - 20 Units after every meal   Objective:  BP (!) 135/96   Pulse (!) 114   Ht 5\' 10"  (1.778 m)   Wt 161 lb 2 oz (73.1  kg)   SpO2 98%   BMI 23.12 kg/m  Vitals and nursing note reviewed  General: NAD, well-appearing, well-nourished Respiratory: No respiratory distress, breathing comfortably, able to speak in full sentences Skin: warm and dry, no rashes noted on exposed skin Psych: Appropriate affect and mood   Assessment & Plan:    Uncontrolled type 1 diabetes mellitus with hyperglycemia, with long-term current use of insulin ([place]) Patient with poorly controlled type 1 diabetes, last A1c was 9.32 months prior.  Currently on Lantus and NovoLog.  Has issue with noncompliance in the past and continued drug abuse per chart review. 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  Reports previously having been on Topamax as a child.  Has not been on any medications, patient is unclear when he had exact last seizure but thinks it was 2 months ago. - Referral to neurology placed  Abdominal pain Reports abdominal pain in the last several days that has worsened.  History of hepatitis C, see below.  Due to entirety of presentation, unable to fully assess or evaluate this. - Follow-up in 2 weeks  Hep C w/o coma, chronic (HCC) Previously noted LFT elevations on prior hospitalizations.  Diagnosed with hepatitis C 2 years ago per patient after sharing syringes for his insulin. - Referral to infectious disease placed - CMP today  Homeless Homeless with limited food supply, which complicates picture of type 1 diabetes. - Social work referral placed    Return in about 2 weeks (around 07/16/2022) for New patient follow-up.   [name] Stech, DO

## 2022-07-03 ENCOUNTER — Telehealth: Payer: Self-pay

## 2022-07-03 ENCOUNTER — Other Ambulatory Visit (HOSPITAL_COMMUNITY): Payer: Self-pay

## 2022-07-03 NOTE — Telephone Encounter (Signed)
Reached out to pt regarding medication cost issues.   Informed pt since he has medicaid, the pharmacy should be able to waive his copays due to him not having income/ being homeless.  Pt agreed and will proceed to pickup medication.  Reached out to pharmacy as well and was informed pt WILL be able to get medication with a waived copay since he has active medicaid.

## 2022-07-06 ENCOUNTER — Emergency Department (HOSPITAL_COMMUNITY): Payer: Medicaid Other

## 2022-07-06 ENCOUNTER — Other Ambulatory Visit: Payer: Self-pay

## 2022-07-06 ENCOUNTER — Encounter (HOSPITAL_COMMUNITY): Payer: Self-pay

## 2022-07-06 ENCOUNTER — Inpatient Hospital Stay (HOSPITAL_COMMUNITY)
Admission: EM | Admit: 2022-07-06 | Discharge: 2022-07-08 | DRG: 638 | Disposition: A | Discharge status: Home / Self Care | Payer: Medicaid Other | Attending: Family Medicine | Admitting: Family Medicine

## 2022-07-06 DIAGNOSIS — F17210 Nicotine dependence, cigarettes, uncomplicated: Secondary | ICD-10-CM | POA: Diagnosis present

## 2022-07-06 DIAGNOSIS — D573 Sickle-cell trait: Secondary | ICD-10-CM | POA: Diagnosis present

## 2022-07-06 DIAGNOSIS — F1990 Other psychoactive substance use, unspecified, uncomplicated: Secondary | ICD-10-CM | POA: Diagnosis present

## 2022-07-06 DIAGNOSIS — Z91014 Allergy to mammalian meats: Secondary | ICD-10-CM | POA: Diagnosis not present

## 2022-07-06 DIAGNOSIS — F913 Oppositional defiant disorder: Secondary | ICD-10-CM | POA: Diagnosis present

## 2022-07-06 DIAGNOSIS — B182 Chronic viral hepatitis C: Secondary | ICD-10-CM | POA: Diagnosis present

## 2022-07-06 DIAGNOSIS — F314 Bipolar disorder, current episode depressed, severe, without psychotic features: Secondary | ICD-10-CM | POA: Diagnosis present

## 2022-07-06 DIAGNOSIS — Z91018 Allergy to other foods: Secondary | ICD-10-CM | POA: Diagnosis not present

## 2022-07-06 DIAGNOSIS — J1089 Influenza due to other identified influenza virus with other manifestations: Secondary | ICD-10-CM | POA: Diagnosis present

## 2022-07-06 DIAGNOSIS — E1065 Type 1 diabetes mellitus with hyperglycemia: Secondary | ICD-10-CM

## 2022-07-06 DIAGNOSIS — E878 Other disorders of electrolyte and fluid balance, not elsewhere classified: Secondary | ICD-10-CM | POA: Diagnosis present

## 2022-07-06 DIAGNOSIS — K5090 Crohn's disease, unspecified, without complications: Secondary | ICD-10-CM | POA: Diagnosis present

## 2022-07-06 DIAGNOSIS — E871 Hypo-osmolality and hyponatremia: Secondary | ICD-10-CM | POA: Diagnosis present

## 2022-07-06 DIAGNOSIS — G40309 Generalized idiopathic epilepsy and epileptic syndromes, not intractable, without status epilepticus: Secondary | ICD-10-CM | POA: Diagnosis not present

## 2022-07-06 DIAGNOSIS — Z597 Insufficient social insurance and welfare support: Secondary | ICD-10-CM | POA: Diagnosis not present

## 2022-07-06 DIAGNOSIS — R7401 Elevation of levels of liver transaminase levels: Secondary | ICD-10-CM | POA: Diagnosis present

## 2022-07-06 DIAGNOSIS — F1910 Other psychoactive substance abuse, uncomplicated: Secondary | ICD-10-CM | POA: Diagnosis not present

## 2022-07-06 DIAGNOSIS — Z79899 Other long term (current) drug therapy: Secondary | ICD-10-CM | POA: Diagnosis not present

## 2022-07-06 DIAGNOSIS — B1920 Unspecified viral hepatitis C without hepatic coma: Secondary | ICD-10-CM | POA: Diagnosis present

## 2022-07-06 DIAGNOSIS — F909 Attention-deficit hyperactivity disorder, unspecified type: Secondary | ICD-10-CM | POA: Diagnosis present

## 2022-07-06 DIAGNOSIS — G40409 Other generalized epilepsy and epileptic syndromes, not intractable, without status epilepticus: Secondary | ICD-10-CM | POA: Diagnosis present

## 2022-07-06 DIAGNOSIS — E1165 Type 2 diabetes mellitus with hyperglycemia: Principal | ICD-10-CM

## 2022-07-06 DIAGNOSIS — Z91013 Allergy to seafood: Secondary | ICD-10-CM | POA: Diagnosis not present

## 2022-07-06 DIAGNOSIS — Z1152 Encounter for screening for COVID-19: Secondary | ICD-10-CM | POA: Diagnosis not present

## 2022-07-06 DIAGNOSIS — E1010 Type 1 diabetes mellitus with ketoacidosis without coma: Secondary | ICD-10-CM

## 2022-07-06 DIAGNOSIS — Z5900 Homelessness unspecified: Secondary | ICD-10-CM

## 2022-07-06 DIAGNOSIS — F1510 Other stimulant abuse, uncomplicated: Secondary | ICD-10-CM | POA: Diagnosis present

## 2022-07-06 DIAGNOSIS — E0810 Diabetes mellitus due to underlying condition with ketoacidosis without coma: Secondary | ICD-10-CM

## 2022-07-06 DIAGNOSIS — Z91199 Patient's noncompliance with other medical treatment and regimen due to unspecified reason: Secondary | ICD-10-CM

## 2022-07-06 DIAGNOSIS — Z794 Long term (current) use of insulin: Secondary | ICD-10-CM

## 2022-07-06 HISTORY — DX: Type 1 diabetes mellitus with ketoacidosis without coma: E10.10

## 2022-07-06 HISTORY — DX: Unspecified viral hepatitis C without hepatic coma: B19.20

## 2022-07-06 LAB — URINALYSIS, ROUTINE W REFLEX MICROSCOPIC
Bacteria, UA: NONE SEEN
Bilirubin Urine: NEGATIVE
Glucose, UA: >=500 mg/dL — AB
Hgb urine dipstick: NEGATIVE
Ketones, ur: 5 mg/dL — AB
Leukocytes,Ua: NEGATIVE
Nitrite: NEGATIVE
Protein, ur: NEGATIVE mg/dL
Specific Gravity, Urine: 1.022 (ref 1.005–1.030)
pH: 6 (ref 5.0–8.0)

## 2022-07-06 LAB — BLOOD GAS, VENOUS
Acid-base deficit: 4.5 mmol/L — ABNORMAL HIGH (ref 0.0–2.0)
Bicarbonate: 20.3 mmol/L (ref 20.0–28.0)
O2 Saturation: 83.4 %
Patient temperature: 36.1
pCO2, Ven: 35 mmHg — ABNORMAL LOW (ref 44–60)
pH, Ven: 7.37 (ref 7.25–7.43)
pO2, Ven: 48 mmHg — ABNORMAL HIGH (ref 32–45)

## 2022-07-06 LAB — CBC WITH DIFFERENTIAL/PLATELET
Abs Immature Granulocytes: 0.02 10*3/uL (ref 0.00–0.07)
Basophils Absolute: 0 10*3/uL (ref 0.0–0.1)
Basophils Relative: 1 %
Eosinophils Absolute: 0 10*3/uL (ref 0.0–0.5)
Eosinophils Relative: 0 %
HCT: 41.5 % (ref 39.0–52.0)
Hemoglobin: 13.9 g/dL (ref 13.0–17.0)
Immature Granulocytes: 1 %
Lymphocytes Relative: 30 %
Lymphs Abs: 1.1 10*3/uL (ref 0.7–4.0)
MCH: 26.4 pg (ref 26.0–34.0)
MCHC: 33.5 g/dL (ref 30.0–36.0)
MCV: 78.9 fL — ABNORMAL LOW (ref 80.0–100.0)
Monocytes Absolute: 0.4 10*3/uL (ref 0.1–1.0)
Monocytes Relative: 11 %
Neutro Abs: 2.1 10*3/uL (ref 1.7–7.7)
Neutrophils Relative %: 57 %
Platelets: 202 10*3/uL (ref 150–400)
RBC: 5.26 MIL/uL (ref 4.22–5.81)
RDW: 14.8 % (ref 11.5–15.5)
WBC: 3.7 10*3/uL — ABNORMAL LOW (ref 4.0–10.5)
nRBC: 0 % (ref 0.0–0.2)

## 2022-07-06 LAB — COMPREHENSIVE METABOLIC PANEL
ALT: 66 U/L — ABNORMAL HIGH (ref 0–44)
AST: 71 U/L — ABNORMAL HIGH (ref 15–41)
Albumin: 2.9 g/dL — ABNORMAL LOW (ref 3.5–5.0)
Alkaline Phosphatase: 119 U/L (ref 38–126)
Anion gap: 16 — ABNORMAL HIGH (ref 5–15)
BUN: 16 mg/dL (ref 6–20)
CO2: 17 mmol/L — ABNORMAL LOW (ref 22–32)
Calcium: 7.7 mg/dL — ABNORMAL LOW (ref 8.9–10.3)
Chloride: 92 mmol/L — ABNORMAL LOW (ref 98–111)
Creatinine, Ser: 1.08 mg/dL (ref 0.61–1.24)
GFR, Estimated: >60 mL/min (ref >60)
Glucose, Bld: 733 mg/dL (ref 70–99)
Potassium: 4.5 mmol/L (ref 3.5–5.1)
Sodium: 125 mmol/L — ABNORMAL LOW (ref 135–145)
Total Bilirubin: 0.8 mg/dL (ref 0.3–1.2)
Total Protein: 6.4 g/dL — ABNORMAL LOW (ref 6.5–8.1)

## 2022-07-06 LAB — CBG MONITORING, ED
Glucose-Capillary: 586 mg/dL (ref 70–99)
Glucose-Capillary: 282 mg/dL — ABNORMAL HIGH (ref 70–99)
Glucose-Capillary: 230 mg/dL — ABNORMAL HIGH (ref 70–99)
Glucose-Capillary: 193 mg/dL — ABNORMAL HIGH (ref 70–99)

## 2022-07-06 LAB — BASIC METABOLIC PANEL
Anion gap: 7 (ref 5–15)
BUN: 11 mg/dL (ref 6–20)
CO2: 24 mmol/L (ref 22–32)
Calcium: 7.5 mg/dL — ABNORMAL LOW (ref 8.9–10.3)
Chloride: 103 mmol/L (ref 98–111)
Creatinine, Ser: 0.66 mg/dL (ref 0.61–1.24)
GFR, Estimated: >60 mL/min (ref >60)
Glucose, Bld: 186 mg/dL — ABNORMAL HIGH (ref 70–99)
Potassium: 3 mmol/L — ABNORMAL LOW (ref 3.5–5.1)
Sodium: 134 mmol/L — ABNORMAL LOW (ref 135–145)

## 2022-07-06 LAB — LIPASE, BLOOD: Lipase: 40 U/L (ref 11–51)

## 2022-07-06 LAB — GLUCOSE, CAPILLARY: Glucose-Capillary: 154 mg/dL — ABNORMAL HIGH (ref 70–99)

## 2022-07-06 LAB — BETA-HYDROXYBUTYRIC ACID: Beta-Hydroxybutyric Acid: 1.18 mmol/L — ABNORMAL HIGH (ref 0.05–0.27)

## 2022-07-06 MED ORDER — POTASSIUM CHLORIDE 10 MEQ/100ML IV SOLN: 10.0000 meq | INTRAVENOUS | Status: DC

## 2022-07-06 MED ORDER — DEXTROSE 50 % IV SOLN: 0.0000 mL | INTRAVENOUS | Status: DC | PRN

## 2022-07-06 MED ORDER — DEXTROSE IN LACTATED RINGERS 5 % IV SOLN: INTRAVENOUS | Status: DC

## 2022-07-06 MED ORDER — ENOXAPARIN SODIUM 40 MG/0.4ML IJ SOSY
40.0000 mg | PREFILLED_SYRINGE | INTRAMUSCULAR | Status: DC
  Administered 2022-07-07: 40 mg via SUBCUTANEOUS
  Filled 2022-07-06 (×2): qty 0.4

## 2022-07-06 MED ORDER — LACTATED RINGERS IV BOLUS
20.0000 mL/kg | Freq: Once | INTRAVENOUS | Status: AC
  Administered 2022-07-06: 1462 mL via INTRAVENOUS

## 2022-07-06 MED ORDER — KETOROLAC TROMETHAMINE 15 MG/ML IJ SOLN
15.0000 mg | Freq: Once | INTRAMUSCULAR | Status: DC
  Filled 2022-07-06: qty 1

## 2022-07-06 MED ORDER — QUETIAPINE FUMARATE 50 MG PO TABS: 75.0000 mg | ORAL_TABLET | Freq: Every day | ORAL | Status: DC

## 2022-07-06 MED ORDER — LACTATED RINGERS IV SOLN: INTRAVENOUS | Status: DC

## 2022-07-06 MED ORDER — ORAL CARE MOUTH RINSE: 15.0000 mL | OROMUCOSAL | Status: DC | PRN

## 2022-07-06 MED ORDER — KETOROLAC TROMETHAMINE 30 MG/ML IJ SOLN
15.0000 mg | Freq: Once | INTRAMUSCULAR | Status: AC
  Administered 2022-07-06: 15 mg via INTRAVENOUS
  Filled 2022-07-06: qty 1

## 2022-07-06 MED ORDER — SODIUM CHLORIDE 0.9 % IV BOLUS
1000.0000 mL | Freq: Once | INTRAVENOUS | Status: AC
  Administered 2022-07-06: 1000 mL via INTRAVENOUS

## 2022-07-06 MED ORDER — ONDANSETRON 4 MG PO TBDP
4.0000 mg | ORAL_TABLET | Freq: Once | ORAL | Status: AC
  Administered 2022-07-06: 4 mg via ORAL
  Filled 2022-07-06: qty 1

## 2022-07-06 MED ORDER — CHLORHEXIDINE GLUCONATE CLOTH 2 % EX PADS
6.0000 | MEDICATED_PAD | Freq: Every day | CUTANEOUS | Status: DC
  Administered 2022-07-08: 6 via TOPICAL

## 2022-07-06 MED ORDER — MORPHINE SULFATE (PF) 4 MG/ML IV SOLN
4.0000 mg | Freq: Once | INTRAVENOUS | Status: AC
  Administered 2022-07-06: 4 mg via INTRAVENOUS
  Filled 2022-07-06: qty 1

## 2022-07-06 MED ORDER — INSULIN REGULAR(HUMAN) IN NACL 100-0.9 UT/100ML-% IV SOLN
INTRAVENOUS | Status: DC
  Administered 2022-07-06: 11 [IU]/h via INTRAVENOUS
  Filled 2022-07-06: qty 100

## 2022-07-06 MED ORDER — IOHEXOL 300 MG/ML  SOLN
100.0000 mL | Freq: Once | INTRAMUSCULAR | Status: AC | PRN
  Administered 2022-07-06: 100 mL via INTRAVENOUS

## 2022-07-06 NOTE — Assessment & Plan Note (Signed)
Not currently taking any meds for this, hasn't filled seroquel since March 2023

## 2022-07-06 NOTE — ED Triage Notes (Signed)
Pt presents with c/o hyperglycemia and abdominal pain with some associated lower back pain for 4 days. Pt's CBG is 586 upon arrival to triage room. Pt reports some vomiting with some nausea as well.

## 2022-07-06 NOTE — ED Provider Triage Note (Addendum)
Emergency Medicine Provider Triage Evaluation Note  [name] [name]. , a 22 y.o. male  was evaluated in triage.  Pt complains of blood sugar readings of "high" at home. Using Novolog and Lantus from a friend because unable to obtain on his own due to lack of insurance. Frequent history of DKA. Complaining of severe abdominal pain, diffuse but worst in RUQ and uncontrollable nausea, vomiting, and diarrhea for several days. Reports his breathing has been labored the last day or so which is similar to when he has been in DKA in the past. No fevers or chest pain. Glucose 586 in triage  Review of Systems  Positive: See HPI Negative: See HPI  Physical Exam  BP (!) 147/92 (BP Location: Left Arm)   Pulse (!) 102   Temp 97.6 F (36.4 C) (Oral)   Resp 18   SpO2 100%  Gen:   Awake, moderate distress Resp:  Normal effort LCTA MSK:   Moves extremities without difficulty  Other:  Severe RUQ tenderness, moderate tenderness to remainder of abdomen, + guarding  Medical Decision Making  Medically screening exam initiated at 4:32 PM.  Appropriate orders placed.  [name] [name]. was informed that the remainder of the evaluation will be completed by another provider, this initial triage assessment does not replace that evaluation, and the importance of remaining in the ED until their evaluation is complete.     [name] Righter, PA-C 07/06/22 1634    [name] Righter, PA-C 07/06/22 1655

## 2022-07-06 NOTE — Assessment & Plan Note (Signed)
No seizures at this time, doesn't look like he takes any chronic seizure meds from what I can see of his med list. Monitor for now.

## 2022-07-06 NOTE — ED Provider Notes (Signed)
[facility]-EMERGENCY DEPT Provider Note   CSN: [id number] Arrival date & time: 07/06/22  1609     History  Chief Complaint  Patient presents with   Hyperglycemia   Abdominal Pain    [name] [name]. is a 22 y.o. male.   Hyperglycemia Associated symptoms: abdominal pain   Abdominal Pain   22 year old male presents emergency department with complaints of high blood sugar, abdominal pain, back pain.  Patient reports history of uncontrolled diabetes mellitus no which he has had frequent episodes of DKA.  Reports compliance with his at home NovoLog as well as Levemir.  He states over the past several days, at home glucometer has read "high."  Patient had to increase NovoLog taking it every 3-4 hours with no success in readable blood sugar.  He is currently complaining of epigastric abdominal pain of which she states is similar to prior episodes of hyperglycemia/DKA as well as low back pain.  Secondary endorses polydipsia, polyuria.  Denies fever, chills, night sweats, chest pain, shortness of breath, change in vision, headache, change in bowel habits.  Denies saddle anesthesia, bowel/bladder dysfunction, weakness/sensory deficits in lower extremities, history of IV drug use, known malignancy.  Past medical history significant for SBO, DKA, diabetes mellitus type 1, methamphetamine intoxication, suicidal ideation, homelessness, ODD, sickle cell trait, epileptic seizure  Home Medications Prior to Admission medications   Medication Sig Start Date End Date Taking? Authorizing Provider  Accu-Chek Softclix Lancets lancets Use as instructed 07/02/22   Lilland, Alana, DO  Blood Glucose Monitoring Suppl (ACCU-CHEK GUIDE) w/Device KIT 1 each by Does not apply route daily. 07/02/22   Lilland, Alana, DO  Cyanocobalamin (VITAMIN B12 PO) Take 1 tablet by mouth daily.    [provider]  EPINEPHrine 0.3 mg/0.3 mL IJ SOAJ injection Inject 0.3 mg into the muscle once as needed  for anaphylaxis (severe allergic reaction). 05/02/20   [provider]  glucose blood (ACCU-CHEK GUIDE) test strip Use as instructed 07/02/22   Lilland, Alana, DO  insulin aspart (NOVOLOG FLEXPEN) 100 UNIT/ML FlexPen Inject 8 Units into the skin 3 (three) times daily with meals. Patient not taking: Reported on 04/14/2022 12/04/21   Maretta Bees, MD  insulin aspart (NOVOLOG FLEXPEN) 100 UNIT/ML FlexPen Inject 0-20 Units into the skin 3 (three) times daily with meals. 07/02/22   Lilland, Alana, DO  insulin aspart (NOVOLOG) 100 UNIT/ML injection Inject 0-20 Units into the skin 3 (three) times daily with meals. 12/19/21   Almon Hercules, MD  insulin glargine (LANTUS) 100 UNIT/ML Solostar Pen Inject 30 Units into the skin in the morning and at bedtime. 07/02/22   Lilland, Alana, DO  lisinopril (ZESTRIL) 10 MG tablet Take 1 tablet (10 mg total) by mouth daily. Patient taking differently: Take 5 mg by mouth daily. 12/05/21   Ghimire, Werner Lean, MD  Multiple Vitamin (MULTIVITAMIN WITH MINERALS) TABS tablet Take 1 tablet by mouth daily.    [provider]  QUEtiapine (SEROQUEL) 25 MG tablet Take 75 mg by mouth at bedtime.    [provider]  sertraline (ZOLOFT) 100 MG tablet Take 100 mg by mouth daily.    [provider]  tamsulosin (FLOMAX) 0.4 MG CAPS capsule Take 0.4 mg by mouth daily.    [provider]      Allergies    Bee venom, Fish allergy, Shellfish allergy, and Tea    Review of Systems   Review of Systems  Gastrointestinal:  Positive for abdominal pain.  All other systems reviewed and are negative.   Physical Exam Updated Vital Signs BP 123/77 (BP Location: Left Arm)   Pulse 91   Temp 98.4 F (36.9 C) (Oral)   Resp (!) 26   SpO2 96%  Physical Exam Vitals and nursing note reviewed.  Constitutional:      General: He is not in acute distress.    Appearance: He is well-developed.  HENT:     Head: Normocephalic and atraumatic.  Eyes:      Conjunctiva/sclera: Conjunctivae normal.  Cardiovascular:     Rate and Rhythm: Normal rate and regular rhythm.     Heart sounds: No murmur heard. Pulmonary:     Effort: Pulmonary effort is normal. No respiratory distress.     Breath sounds: Normal breath sounds.  Abdominal:     Palpations: Abdomen is soft.     Tenderness: There is abdominal tenderness in the right upper quadrant and epigastric area. There is no right CVA tenderness or left CVA tenderness.  Musculoskeletal:        General: No swelling.     Cervical back: Neck supple.     Comments: No midline tenderness of cervical, thoracic, lumbar spine with no obvious step-off or deformity.  Mild paraspinal tenderness right paraspinal ring in the lumbar region.  Strength symmetric in bilateral lower extremities.  No sensory deficits along major nerve distribution of the lower extremities.  DTR symmetric and equal.  Pedal pulses symmetric and full bilaterally.  Skin:    General: Skin is warm and dry.     Capillary Refill: Capillary refill takes less than 2 seconds.  Neurological:     Mental Status: He is alert.  Psychiatric:        Mood and Affect: Mood normal.     ED Results / Procedures / Treatments   Labs (all labs ordered are listed, but only abnormal results are displayed) Labs Reviewed  BETA-HYDROXYBUTYRIC ACID - Abnormal; Notable for the following components:      Result Value   Beta-Hydroxybutyric Acid 1.18 (*)    All other components within normal limits  CBC WITH DIFFERENTIAL/PLATELET - Abnormal; Notable for the following components:   WBC 3.7 (*)    MCV 78.9 (*)    All other components within normal limits  URINALYSIS, ROUTINE W REFLEX MICROSCOPIC - Abnormal; Notable for the following components:   Color, Urine COLORLESS (*)    Glucose, UA >=500 (*)    Ketones, ur 5 (*)    All other components within normal limits  BLOOD GAS, VENOUS - Abnormal; Notable for the following components:   pCO2, Ven 35 (*)    pO2, Ven  48 (*)    Acid-base deficit 4.5 (*)    All other components within normal limits  COMPREHENSIVE METABOLIC PANEL - Abnormal; Notable for the following components:   Sodium 125 (*)    Chloride 92 (*)    CO2 17 (*)    Glucose, Bld 733 (*)    Calcium 7.7 (*)    Total Protein 6.4 (*)    Albumin 2.9 (*)    AST 71 (*)    ALT 66 (*)    Anion gap 16 (*)    All other components within normal limits  CBG MONITORING, ED - Abnormal; Notable for the following components:   Glucose-Capillary 586 (*)    All other components within normal limits  CBG MONITORING, ED - Abnormal; Notable for the following components:   Glucose-Capillary 282 (*)  All other components within normal limits  LIPASE, BLOOD  BETA-HYDROXYBUTYRIC ACID  BASIC METABOLIC PANEL  BASIC METABOLIC PANEL  BASIC METABOLIC PANEL  BASIC METABOLIC PANEL  BETA-HYDROXYBUTYRIC ACID  BASIC METABOLIC PANEL    EKG EKG Interpretation  Date/Time:  Sunday July 06 2022 16:45:09 EST Ventricular Rate:  98 PR Interval:  147 QRS Duration: 81 QT Interval:  346 QTC Calculation: 442 R Axis:   72 Text Interpretation: Sinus rhythm Nonspecific T wave abnormality Confirmed by Cathren Laine ([id number]) on 07/06/2022 6:08:57 PM  Radiology CT ABDOMEN PELVIS W CONTRAST  Result Date: 07/06/2022 CLINICAL DATA:  Acute abdominal pain. EXAM: CT ABDOMEN AND PELVIS WITH CONTRAST TECHNIQUE: Multidetector CT imaging of the abdomen and pelvis was performed using the standard protocol following bolus administration of intravenous contrast. RADIATION DOSE REDUCTION: This exam was performed according to the departmental dose-optimization program which includes automated exposure control, adjustment of the mA and/or kV according to patient size and/or use of iterative reconstruction technique. CONTRAST:  OMNIPAQUE IOHEXOL 300 MG/ML  SOLN COMPARISON:  CT chest abdomen and pelvis 04/13/2022 FINDINGS: Lower chest: There are tree-in-bud opacities in the right lower  lobe. Hepatobiliary: The liver is mildly enlarged, unchanged. Gallbladder and bile ducts are within normal limits. Pancreas: Unremarkable. No pancreatic ductal dilatation or surrounding inflammatory changes. Spleen: Normal in size without focal abnormality. Adrenals/Urinary Tract: The bladder is markedly distended. Kidneys and adrenal glands are within normal limits. Stomach/Bowel: Stomach is within normal limits. Appendix appears normal. No evidence of bowel wall thickening, distention, or inflammatory changes. There is a large amount of stool throughout the colon. Vascular/Lymphatic: No significant vascular findings are present. No enlarged abdominal or pelvic lymph nodes. Reproductive: Prostate is unremarkable. Other: No abdominal wall hernia or abnormality. No abdominopelvic ascites. Musculoskeletal: No acute or significant osseous findings. IMPRESSION: 1. No acute localizing process in the abdomen or pelvis. 2. Marked distention of the bladder. 3. Large amount of stool throughout the colon. 4. Tree-in-bud opacities in the right lower lobe, likely infectious/inflammatory. Electronically Signed   By: Darliss Cheney M.D.   On: 07/06/2022 20:04    Procedures .Critical Care  Performed by: Peter Garter, PA Authorized by: Peter Garter, PA   Critical care provider statement:    Critical care time (minutes):  45   Critical care was necessary to treat or prevent imminent or life-threatening deterioration of the following conditions:  Endocrine crisis   Critical care was time spent personally by me on the following activities:  Development of treatment plan with patient or surrogate, discussions with consultants, evaluation of patient's response to treatment, examination of patient, ordering and review of laboratory studies, ordering and review of radiographic studies, ordering and performing treatments and interventions, pulse oximetry, re-evaluation of patient's condition and review of old charts   I  assumed direction of critical care for this patient from another provider in my specialty: no       Medications Ordered in ED Medications  insulin regular, human (MYXREDLIN) 100 units/ 100 mL infusion (3.8 Units/hr Intravenous Rate/Dose Change 07/06/22 2123)  lactated ringers infusion ( Intravenous Not Given 07/06/22 1935)  dextrose 5 % in lactated ringers infusion ( Intravenous New Bag/Given 07/06/22 2123)  dextrose 50 % solution 0-50 mL (has no administration in time range)  QUEtiapine (SEROQUEL) tablet 75 mg (has no administration in time range)  ondansetron (ZOFRAN-ODT) disintegrating tablet 4 mg (4 mg Oral Given 07/06/22 1713)  sodium chloride 0.9 % bolus 1,000 mL (0 mLs Intravenous Stopped 07/06/22  1825)  ketorolac (TORADOL) 30 MG/ML injection 15 mg (15 mg Intravenous Given 07/06/22 1716)  sodium chloride 0.9 % bolus 1,000 mL (0 mLs Intravenous Stopped 07/06/22 1931)  lactated ringers bolus 1,462 mL (1,462 mLs Intravenous Bolus 07/06/22 1934)  morphine (PF) 4 MG/ML injection 4 mg (4 mg Intravenous Given 07/06/22 1829)  iohexol (OMNIPAQUE) 300 MG/ML solution 100 mL (100 mLs Intravenous Contrast Given 07/06/22 1950)    ED Course/ Medical Decision Making/ A&P Clinical Course as of 07/06/22 2222  Sun Jul 06, 2022  1835 Sodium(!): 125 Corrected sodium level with patient's glucose level of 135 [CR]    Clinical Course User Index [CR] Peter Garter, PA                           Medical Decision Making Amount and/or Complexity of Data Reviewed Labs: ordered. Decision-making details documented in ED Course.  Risk Prescription drug management. Decision regarding hospitalization.   This patient presents to the ED for concern of high blood sugar, this involves an extensive number of treatment options, and is a complaint that carries with it a high risk of complications and morbidity.  The differential diagnosis includes hyperglycemia, DKA, HHS, gastritis, pancreatitis, CBD pathology, acute  cholecystitis, SBO/LBO, diverticulitis, appendicitis, nephrolithiasis, pyelonephritis, cystitis, was diagnosed anemia, troponin   Co morbidities that complicate the patient evaluation  See HPI   Additional history obtained:  Additional history obtained from EMR External records from outside source obtained and reviewed including hospital records   Lab Tests:  I Ordered, and personally interpreted labs.  The pertinent results include: Mild leukopenia of 3.7.  No evidence of anemia.  Platelets within range.  Multiple electrolyte abnormalities including hyponatremia with a sodium of 125, hypochloremia of 92 as well as decreased bicarb 17 with elevation of blood glucose of 733.  Mild transaminitis of 71 and 66 AST and ALT respectively.  Renal function within normal limits.  Patient hydroxybutyric acid elevated at 1.18.  UA significant for 5 ketones, greater than 500 glucose. VBG normal pH.  Lipase is normal limits.   Imaging Studies ordered:  I ordered imaging studies including CT abdomen pelvis I independently visualized and interpreted imaging which showed no acute localizing process in abdomen or pelvis.  Marked distention of bladder.  Large amount of stool throughout colon.  Treatment opacities in left lower lobe. I agree with the radiologist interpretation  Cardiac Monitoring: / EKG:  The patient was maintained on a cardiac monitor.  I personally viewed and interpreted the cardiac monitored which showed an underlying rhythm of: Sinus rhythm with nonspecific T wave symptoms.   Consultations Obtained:  I requested consultation with attending physician Dr. Denton Lank   Problem List / ED Course / Critical interventions / Medication management  DKA I ordered medication including normal saline fluid bolus, Toradol/morphine for pain, insulin/dextrose 5%/potassium hide per DKA order sent  reevaluation of the patient after these medicines showed that the patient improved I have reviewed  the patients home medicines and have made adjustments as needed   Social Determinants of Health:  Homelessness/medical noncompliance   Test / Admission - Considered:  DKA Vitals signs significant for initially tachycardic of which decreased with time elapsed and medicines emergency room emergency department. Otherwise within normal range and stable throughout visit. Laboratory/imaging studies significant for: See above Patient with evidence of DKA.  Upon reassessment, patient admitted to medical noncompliance with at home insulins prompting recurrent DKA episodes.  Patient stabilized in  the emergency department given IV fluids as well as insulin to begin correction.  Hospital medicine Dr. Julian Reil was consulted regarding patient who agreed with admission of the patient and seen further treatment/care. Treatment plan were discussed at length with patient and they knowledge understanding was agreeable to said plan.  Appropriate consultations were made as described in the ED course.  Patient was stable upon admission to the hospital.         Final Clinical Impression(s) / ED Diagnoses Final diagnoses:  Severe hyperglycemia due to diabetes mellitus (HCC)  Diabetic ketoacidosis without coma associated with diabetes mellitus due to underlying condition [facility])    Rx / DC Orders ED Discharge Orders     None         Peter Garter, [place] 07/06/22 2222    Cathren Laine, MD 07/10/22 1614

## 2022-07-06 NOTE — Assessment & Plan Note (Addendum)
Ongoing amphetamine abuse. Will likely leave AMA once DKA improved as per his baseline.

## 2022-07-07 DIAGNOSIS — G40309 Generalized idiopathic epilepsy and epileptic syndromes, not intractable, without status epilepticus: Secondary | ICD-10-CM

## 2022-07-07 DIAGNOSIS — F1910 Other psychoactive substance abuse, uncomplicated: Secondary | ICD-10-CM | POA: Diagnosis not present

## 2022-07-07 DIAGNOSIS — F1510 Other stimulant abuse, uncomplicated: Secondary | ICD-10-CM

## 2022-07-07 DIAGNOSIS — E1010 Type 1 diabetes mellitus with ketoacidosis without coma: Secondary | ICD-10-CM | POA: Diagnosis not present

## 2022-07-07 DIAGNOSIS — F314 Bipolar disorder, current episode depressed, severe, without psychotic features: Secondary | ICD-10-CM

## 2022-07-07 LAB — BASIC METABOLIC PANEL
Anion gap: 8 (ref 5–15)
BUN: 7 mg/dL (ref 6–20)
CO2: 24 mmol/L (ref 22–32)
Calcium: 7.4 mg/dL — ABNORMAL LOW (ref 8.9–10.3)
Chloride: 102 mmol/L (ref 98–111)
Creatinine, Ser: 0.53 mg/dL — ABNORMAL LOW (ref 0.61–1.24)
GFR, Estimated: >60 mL/min (ref >60)
Glucose, Bld: 124 mg/dL — ABNORMAL HIGH (ref 70–99)
Potassium: 3.1 mmol/L — ABNORMAL LOW (ref 3.5–5.1)
Sodium: 134 mmol/L — ABNORMAL LOW (ref 135–145)

## 2022-07-07 LAB — RESP PANEL BY RT-PCR (RSV, FLU A&B, COVID)  RVPGX2
Influenza A by PCR: NEGATIVE
Influenza B by PCR: POSITIVE — AB
Resp Syncytial Virus by PCR: NEGATIVE
SARS Coronavirus 2 by RT PCR: NEGATIVE

## 2022-07-07 LAB — GLUCOSE, CAPILLARY
Glucose-Capillary: 112 mg/dL — ABNORMAL HIGH (ref 70–99)
Glucose-Capillary: 120 mg/dL — ABNORMAL HIGH (ref 70–99)
Glucose-Capillary: 120 mg/dL — ABNORMAL HIGH (ref 70–99)
Glucose-Capillary: 129 mg/dL — ABNORMAL HIGH (ref 70–99)
Glucose-Capillary: 134 mg/dL — ABNORMAL HIGH (ref 70–99)
Glucose-Capillary: 171 mg/dL — ABNORMAL HIGH (ref 70–99)
Glucose-Capillary: 211 mg/dL — ABNORMAL HIGH (ref 70–99)
Glucose-Capillary: 253 mg/dL — ABNORMAL HIGH (ref 70–99)
Glucose-Capillary: 337 mg/dL — ABNORMAL HIGH (ref 70–99)
Glucose-Capillary: 408 mg/dL — ABNORMAL HIGH (ref 70–99)

## 2022-07-07 LAB — BETA-HYDROXYBUTYRIC ACID: Beta-Hydroxybutyric Acid: 0.51 mmol/L — ABNORMAL HIGH (ref 0.05–0.27)

## 2022-07-07 LAB — MRSA NEXT GEN BY PCR, NASAL: MRSA by PCR Next Gen: NOT DETECTED

## 2022-07-07 MED ORDER — SODIUM CHLORIDE 0.9 % IV SOLN: 250.0000 mL | INTRAVENOUS | Status: DC | PRN

## 2022-07-07 MED ORDER — OSELTAMIVIR PHOSPHATE 75 MG PO CAPS
75.0000 mg | ORAL_CAPSULE | Freq: Two times a day (BID) | ORAL | Status: DC
  Administered 2022-07-07 – 2022-07-08 (×3): 75 mg via ORAL
  Filled 2022-07-07 (×3): qty 1

## 2022-07-07 MED ORDER — INSULIN ASPART 100 UNIT/ML IJ SOLN
0.0000 [IU] | Freq: Every day | INTRAMUSCULAR | Status: DC
  Administered 2022-07-07: 3 [IU] via SUBCUTANEOUS

## 2022-07-07 MED ORDER — INSULIN ASPART 100 UNIT/ML IJ SOLN
20.0000 [IU] | Freq: Once | INTRAMUSCULAR | Status: AC
  Administered 2022-07-07: 20 [IU] via SUBCUTANEOUS

## 2022-07-07 MED ORDER — POTASSIUM CHLORIDE CRYS ER 20 MEQ PO TBCR
40.0000 meq | EXTENDED_RELEASE_TABLET | Freq: Once | ORAL | Status: AC
  Administered 2022-07-07: 40 meq via ORAL
  Filled 2022-07-07: qty 2

## 2022-07-07 MED ORDER — INSULIN GLARGINE-YFGN 100 UNIT/ML ~~LOC~~ SOLN
5.0000 [IU] | Freq: Once | SUBCUTANEOUS | Status: AC
  Administered 2022-07-07: 5 [IU] via SUBCUTANEOUS
  Filled 2022-07-07: qty 0.05

## 2022-07-07 MED ORDER — KETOROLAC TROMETHAMINE 30 MG/ML IJ SOLN
15.0000 mg | Freq: Once | INTRAMUSCULAR | Status: AC | PRN
  Administered 2022-07-07: 15 mg via INTRAVENOUS
  Filled 2022-07-07: qty 1

## 2022-07-07 MED ORDER — QUETIAPINE FUMARATE 50 MG PO TABS
50.0000 mg | ORAL_TABLET | Freq: Once | ORAL | Status: AC
  Administered 2022-07-07: 50 mg via ORAL
  Filled 2022-07-07: qty 1

## 2022-07-07 MED ORDER — SODIUM CHLORIDE 0.9% FLUSH: 3.0000 mL | INTRAVENOUS | Status: DC | PRN

## 2022-07-07 MED ORDER — INSULIN ASPART 100 UNIT/ML IJ SOLN
0.0000 [IU] | Freq: Three times a day (TID) | INTRAMUSCULAR | Status: DC
  Administered 2022-07-07: 5 [IU] via SUBCUTANEOUS
  Administered 2022-07-07: 11 [IU] via SUBCUTANEOUS
  Administered 2022-07-08: 8 [IU] via SUBCUTANEOUS
  Administered 2022-07-08: 5 [IU] via SUBCUTANEOUS

## 2022-07-07 MED ORDER — LISINOPRIL 2.5 MG PO TABS
5.0000 mg | ORAL_TABLET | Freq: Every day | ORAL | Status: DC
  Administered 2022-07-08: 5 mg via ORAL
  Filled 2022-07-07: qty 2

## 2022-07-07 MED ORDER — SODIUM CHLORIDE 0.9% FLUSH
3.0000 mL | Freq: Two times a day (BID) | INTRAVENOUS | Status: DC
  Administered 2022-07-07 – 2022-07-08 (×3): 3 mL via INTRAVENOUS

## 2022-07-07 MED ORDER — TAMSULOSIN HCL 0.4 MG PO CAPS
0.4000 mg | ORAL_CAPSULE | Freq: Every day | ORAL | Status: DC
  Administered 2022-07-08: 0.4 mg via ORAL
  Filled 2022-07-07: qty 1

## 2022-07-07 MED ORDER — INSULIN GLARGINE-YFGN 100 UNIT/ML ~~LOC~~ SOLN
26.0000 [IU] | Freq: Two times a day (BID) | SUBCUTANEOUS | Status: DC
  Administered 2022-07-07 – 2022-07-08 (×3): 26 [IU] via SUBCUTANEOUS
  Filled 2022-07-07 (×4): qty 0.26

## 2022-07-07 MED ORDER — SERTRALINE HCL 100 MG PO TABS
100.0000 mg | ORAL_TABLET | Freq: Every day | ORAL | Status: DC
  Administered 2022-07-08: 100 mg via ORAL
  Filled 2022-07-07: qty 1

## 2022-07-07 NOTE — Assessment & Plan Note (Signed)
Pt with mild DKA Hyperglycemic crisis pathway NPO except non-caloric liquids, meds Insulin gtt per pathway IVF bolus and maint fluids per pathway BMP Q4H Replacing K

## 2022-07-07 NOTE — H&P (Addendum)
History and Physical    Patient: [name] [name]. HUT:[id number] DOB: [birth year] DOA: 07/06/2022 DOS: the patient was seen and examined on 07/07/2022 PCP: Rise Patience, DO  Patient coming from: Home  Chief Complaint:  Chief Complaint  Patient presents with   Hyperglycemia   Abdominal Pain   HPI: [name] [name]. is a 22 y.o. male with medical history significant of DM1, homelessness, polysubstance abuse, methamphetamine abuse, HCV.  Pt in to ED with c/o hyperglycemia, abd pain, back pain.  H/o uncontrolled DM.  Admits he isnt regular about taking insulin.   He states over the past several days, at home glucometer has read "high."  Patient had to increase NovoLog taking it every 3-4 hours with no success in readable blood sugar.  He is currently complaining of epigastric abdominal pain of which she states is similar to prior episodes of hyperglycemia/DKA as well as low back pain.  Secondary endorses polydipsia, polyuria.  Denies fever, chills, night sweats, chest pain, shortness of breath, change in vision, headache, change in bowel habits.   Denies IVDU to EDP though it is in his chart. Looks like methamphetamine is his drug of choice.   Review of Systems: As mentioned in the history of present illness. All other systems reviewed and are negative. Past Medical History:  Diagnosis Date   ADD (attention deficit disorder)    Allergy    Anxiety    Asthma    Depression    DKA (diabetic ketoacidosis) ([place]) 11/10/2020   Epileptic seizure ([place])    "both petit and grand mal; last sz ~ 2 wk ago" (06/17/2013)   Fracture of multiple ribs 05/11/2020   HCV (hepatitis C virus)    Headache(784.0)    "2-3 times/wk usually" (06/17/2013)   Heart murmur    "heard a slight one earlier today" (06/17/2013)   Homeless    Methamphetamine intoxication ([place]) 10/26/2018   Migraine    "maybe once/month; it's severe" (06/17/2013)   Migraine without status migrainosus, not intractable 12/13/2013    ODD (oppositional defiant disorder)    Partial small bowel obstruction ([place]) 11/11/2021   SBO (small bowel obstruction) ([place]) 11/11/2021   Sickle cell trait ([place])    Suicidal ideation 10/06/2019   Suicidal ideations 03/31/2014   Type I diabetes mellitus ([place])    "that's what they're thinking now" (06/17/2013)   Vision abnormalities    "takes him longer to focus cause; from his sz" (06/17/2013)   Past Surgical History:  Procedure Laterality Date   CIRCUMCISION  2000   FINGER SURGERY Left 2001   "crushed pinky; had to repair it" (06/17/2013)   Social History:  reports that he has been smoking cigarettes. He has been smoking an average of .5 packs per day. He has never used smokeless tobacco. He reports current drug use. Drug: Methamphetamines. He reports that he does not drink alcohol.  Allergies  Allergen Reactions   Bee Venom Anaphylaxis   Fish Allergy Anaphylaxis   Shellfish Allergy Anaphylaxis   Tea Anaphylaxis    Family History  Problem Relation Age of Onset   Asthma Mother    COPD Mother    Other Mother        possible autoimmune, unclear   Migraines Sister        Hemiplegic Migraines    Diabetes Maternal Grandmother    Heart disease Maternal Grandmother    Hypertension Maternal Grandmother    Mental illness Maternal Grandmother    Heart disease Maternal Grandfather  Hyperlipidemia Paternal Grandmother    Hyperlipidemia Paternal Grandfather     Prior to Admission medications   Medication Sig Start Date End Date Taking? Authorizing Provider  Accu-Chek Softclix Lancets lancets Use as instructed 07/02/22   Lilland, Alana, DO  Blood Glucose Monitoring Suppl (ACCU-CHEK GUIDE) w/Device KIT 1 each by Does not apply route daily. 07/02/22   Lilland, Alana, DO  Cyanocobalamin (VITAMIN B12 PO) Take 1 tablet by mouth daily.    [provider]  EPINEPHrine 0.3 mg/0.3 mL IJ SOAJ injection Inject 0.3 mg into the muscle once as needed for anaphylaxis (severe allergic  reaction). 05/02/20   [provider]  glucose blood (ACCU-CHEK GUIDE) test strip Use as instructed 07/02/22   Lilland, Alana, DO  insulin aspart (NOVOLOG FLEXPEN) 100 UNIT/ML FlexPen Inject 8 Units into the skin 3 (three) times daily with meals. Patient not taking: Reported on 04/14/2022 12/04/21   Maretta Bees, MD  insulin aspart (NOVOLOG FLEXPEN) 100 UNIT/ML FlexPen Inject 0-20 Units into the skin 3 (three) times daily with meals. 07/02/22   Lilland, Alana, DO  insulin aspart (NOVOLOG) 100 UNIT/ML injection Inject 0-20 Units into the skin 3 (three) times daily with meals. 12/19/21   Almon Hercules, MD  insulin glargine (LANTUS) 100 UNIT/ML Solostar Pen Inject 30 Units into the skin in the morning and at bedtime. 07/02/22   Lilland, Alana, DO  lisinopril (ZESTRIL) 10 MG tablet Take 1 tablet (10 mg total) by mouth daily. Patient taking differently: Take 5 mg by mouth daily. 12/05/21   Ghimire, Werner Lean, MD  Multiple Vitamin (MULTIVITAMIN WITH MINERALS) TABS tablet Take 1 tablet by mouth daily.    [provider]  QUEtiapine (SEROQUEL) 25 MG tablet Take 75 mg by mouth at bedtime.    [provider]  sertraline (ZOLOFT) 100 MG tablet Take 100 mg by mouth daily.    [provider]  tamsulosin (FLOMAX) 0.4 MG CAPS capsule Take 0.4 mg by mouth daily.    [provider]    Physical Exam: Vitals:   07/06/22 2030 07/06/22 2100 07/06/22 2130 07/06/22 2322  BP: 127/86 133/82 123/77 (!) 157/103  Pulse: 91 96 91 87  Resp: (!) 21 13 (!) 26 18  Temp:    98.2 F (36.8 C)  TempSrc:    Oral  SpO2: 99% 100% 96% 98%  Weight:    74 kg  Height:    5\' 11"  (1.803 m)   Constitutional: NAD, calm, comfortable Eyes: PERRL, lids and conjunctivae normal ENMT: Mucous membranes are moist. Posterior pharynx clear of any exudate or lesions.Normal dentition.  Neck: normal, supple, no masses, no thyromegaly Respiratory: clear to auscultation bilaterally, no wheezing, no crackles.  Normal respiratory effort. No accessory muscle use.  Cardiovascular: Regular rate and rhythm, no murmurs / rubs / gallops. No extremity edema. 2+ pedal pulses. No carotid bruits.  Abdomen: no tenderness, no masses palpated. No hepatosplenomegaly. Bowel sounds positive.  Musculoskeletal: no clubbing / cyanosis. No joint deformity upper and lower extremities. Good ROM, no contractures. Normal muscle tone.  Skin: no rashes, lesions, ulcers. No induration Neurologic: CN 2-12 grossly intact. Sensation intact, DTR normal. Strength 5/5 in all 4.  Psychiatric: Normal judgment and insight. Alert and oriented x 3. Normal mood.   Data Reviewed:       Latest Ref Rng & Units 07/06/2022    5:15 PM 04/14/2022    3:55 AM 04/13/2022    8:38 PM  CBC  WBC 4.0 - 10.5 K/uL 3.7  10.6    Hemoglobin 13.0 - 17.0 g/dL 81.8  29.9  37.1   Hematocrit 39.0 - 52.0 % 41.5  34.1  49.0   Platelets 150 - 400 K/uL 202  393        Latest Ref Rng & Units 07/06/2022   10:38 PM 07/06/2022    5:15 PM 04/14/2022   10:30 AM  CMP  Glucose 70 - 99 mg/dL 696  789  381   BUN 6 - 20 mg/dL 11  16  10    Creatinine 0.61 - 1.24 mg/dL  0.17  5.10   Sodium 135 - 145 mmol/L 134  125  136   Potassium 3.5 - 5.1 mmol/L 3.0  4.5  3.5   Chloride 98 - 111 mmol/L 103  92  109   CO2 22 - 32 mmol/L 24  17  21    Calcium 8.9 - 10.3 mg/dL 7.5  7.7  8.0   Total Protein 6.5 - 8.1 g/dL  6.4    Total Bilirubin 0.3 - 1.2 mg/dL  0.8    Alkaline Phos 38 - 126 U/L  119    AST 15 - 41 U/L  71    ALT 0 - 44 U/L  66     BHB 1.1  Assessment and Plan: * DKA, type 1 (HCC) Pt with mild DKA Hyperglycemic crisis pathway NPO except non-caloric liquids, meds Insulin gtt per pathway IVF bolus and maint fluids per pathway BMP Q4H Replacing K  Methamphetamine abuse (HCC) Ongoing amphetamine abuse. Will likely leave AMA once DKA improved as per his baseline.  Severe bipolar I disorder, most recent episode depressed [facility]) Not currently taking any  meds for this, hasn't filled seroquel since March 2023  Generalized convulsive epilepsy (HCC) No seizures at this time, doesn't look like he takes any chronic seizure meds from what I can see of his med list. Monitor for now.      Advance Care Planning:   Code Status: Full Code  Consults: None  Family Communication: No family in room  Severity of Illness: The appropriate patient status for this patient is INPATIENT. Inpatient status is judged to be reasonable and necessary in order to provide the required intensity of service to ensure the patient's safety. The patient's presenting symptoms, physical exam findings, and initial radiographic and laboratory data in the context of their chronic comorbidities is felt to place them at high risk for further clinical deterioration. Furthermore, it is not anticipated that the patient will be medically stable for discharge from the hospital within 2 midnights of admission.   * I certify that at the point of admission it is my clinical judgment that the patient will require inpatient hospital care spanning beyond 2 midnights from the point of admission due to high intensity of service, high risk for further deterioration and high frequency of surveillance required.*  Author: [facility] DO 07/07/2022 12:16 AM  For on call review [email or web address].

## 2022-07-07 NOTE — Progress Notes (Signed)
Patient seen examined and assessed independently of admitting physician and seen after midnight 07/07/2022  22 year old homeless male DM TY 1, methamphetamine use, history of seizure, bipolar (unclear if on meds), hep C, potential prior Crohn's disease, GT CT with no recent seizures Admitted with DKA sugars 700 in the setting of noncompliance--- had recently establish care with the family medicine clinic over at [facility], was seen by them on 07/02/2022 prescribed glucometer and was told to try to be compliant  He still has not obtained a glucometer Presented to [facility] long ED with hyperglycemia abdominal pain and back pain sugars in the 500s, initial anion gap 16, blood gas reassuring 7.3/35/48 WBC 3.7 platelet 202  Also found to be flu B [+] but with no respiratory symptoms   O/e BP (!) 111/55   Pulse 82   Temp 98.2 F (36.8 C) (Oral)   Resp 16   Ht 5\' 11"  (1.803 m)   Wt 74 kg   SpO2 97%   BMI 22.75 kg/m  Awake coherent no distress Multiple facial tattoos S1-S2 no murmur Chest is clear no wheeze rales rhonchi Abdomen is slightly distended not obese nontender Power is 5/5  P Ensure that he has good follow-up with the family medicine clinic have reached out to the residency program to ensure that he has a date.  Appreciate their assistance. TOC to assist with glucometer,?  Match letter and look in on needs Start Tamiflu and complete Expect can discharge within the next 24 to 48 hours once this is all done  Verneita Griffes, MD [facility] 8:12 AM

## 2022-07-07 NOTE — Inpatient Diabetes Management (Addendum)
Inpatient Diabetes Program Recommendations  AACE/ADA: New Consensus Statement on Inpatient Glycemic Control (2015)  Target Ranges:  Prepandial:   less than 140 mg/dL      Peak postprandial:   less than 180 mg/dL (1-2 hours)      Critically ill patients:  140 - 180 mg/dL   Lab Results  Component Value Date   GLUCAP 408 (H) 07/07/2022   HGBA1C 9.3 (H) 04/14/2022    Review of Glycemic Control  Diabetes history: DM1 Outpatient Diabetes medications: Lantus 30 BID and Novolog 20 units with meals Current orders for Inpatient glycemic control: Semglee 26 units BID, Novolog 0-15 TID with meals and 0-5 HS  HgbA1C - 9.3% CBGs 337, 211, 408 PCP - [facility]  Inpatient Diabetes Program Recommendations:    Increase Semglee to 30 BID  Add Novolog 10 units TID with meals if eating > 50%  Very familiar with pt from multiple previous admissions. Spoke with pt at bedside. States he has recently returned to [facility] and has all of his insulins and supplies, with exception of blood glucose meter. Has lost considerable amount of weight over past few months and wants to gain it back. Discussed link between weight loss and poor glycemic control. Denies hypoglycemia. Reviewed s/s and treatment.   Pt has Medicaid and will need prescription for blood glucose meter kit (#[id number]) at discharge.   Will follow-up in am.   Thank you. Lorenda Peck, RD, LDN, CDCES Inpatient Diabetes Coordinator [phone]  Addendum: Pt requesting larger portions of food. Please order CHO mod with larger portion sizes, if MD agrees.

## 2022-07-08 ENCOUNTER — Other Ambulatory Visit (HOSPITAL_COMMUNITY): Payer: Self-pay

## 2022-07-08 DIAGNOSIS — E1010 Type 1 diabetes mellitus with ketoacidosis without coma: Secondary | ICD-10-CM | POA: Diagnosis not present

## 2022-07-08 LAB — GLUCOSE, CAPILLARY
Glucose-Capillary: 244 mg/dL — ABNORMAL HIGH (ref 70–99)
Glucose-Capillary: 284 mg/dL — ABNORMAL HIGH (ref 70–99)

## 2022-07-08 MED ORDER — BLOOD GLUCOSE METER KIT
PACK | 0 refills | Status: DC
  Filled 2022-07-08 – 2022-09-26 (×2): qty 1, 30d supply, fill #0
  Filled 2022-09-29: qty 1, 1d supply, fill #0

## 2022-07-08 MED ORDER — SERTRALINE HCL 100 MG PO TABS
100.0000 mg | ORAL_TABLET | Freq: Every day | ORAL | 1 refills | Status: DC
  Filled 2022-07-08 – 2022-10-07 (×3): qty 30, 30d supply, fill #0

## 2022-07-08 MED ORDER — NOVOLOG FLEXPEN 100 UNIT/ML ~~LOC~~ SOPN
0.0000 [IU] | PEN_INJECTOR | Freq: Three times a day (TID) | SUBCUTANEOUS | 11 refills | Status: DC
  Filled 2022-07-08 – 2022-10-07 (×3): qty 15, 25d supply, fill #0

## 2022-07-08 MED ORDER — INSULIN GLARGINE 100 UNIT/ML SOLOSTAR PEN
30.0000 [IU] | PEN_INJECTOR | Freq: Two times a day (BID) | SUBCUTANEOUS | 3 refills | Status: DC
  Filled 2022-07-08 – 2022-10-07 (×3): qty 30, 50d supply, fill #0

## 2022-07-08 MED ORDER — LISINOPRIL 10 MG PO TABS
10.0000 mg | ORAL_TABLET | Freq: Every day | ORAL | 1 refills | Status: DC
  Filled 2022-07-08 – 2022-10-07 (×3): qty 30, 30d supply, fill #0

## 2022-07-08 MED ORDER — ACCU-CHEK SOFTCLIX LANCETS MISC
12 refills | Status: DC
  Filled 2022-07-08 – 2022-09-26 (×2): qty 100, 25d supply, fill #0

## 2022-07-08 MED ORDER — OSELTAMIVIR PHOSPHATE 75 MG PO CAPS
75.0000 mg | ORAL_CAPSULE | Freq: Two times a day (BID) | ORAL | 0 refills | Status: AC
  Filled 2022-07-08: qty 8, 4d supply, fill #0

## 2022-07-08 MED ORDER — QUETIAPINE FUMARATE 50 MG PO TABS
75.0000 mg | ORAL_TABLET | Freq: Every day | ORAL | 0 refills | Status: DC
  Filled 2022-07-08 – 2022-10-07 (×3): qty 60, 40d supply, fill #0

## 2022-07-08 NOTE — TOC Progression Note (Addendum)
Transition of Care (TOC) - Progression Note    Patient Details  Name: Kerney Hopfensperger. MRN: [id number] Date of Birth: 2001/06/25  Transition of Care [facility]) CM/SW [place], RN Phone Number:[phone]  07/08/2022, 10:00 AM  Clinical Narrative:    TOC has been included on message requesting glucometer for patient with history of noncompliance. Patient currently has received glucometer and is not allowed new one at this time under insurance. TOC can not initiate MATCH program funds for patient with medical coverage.   1003 TOC acknowledges consult for  Due to past events, patient is homeless and banned from most shelters in [place] Will likely need meds etc.  Patient does have medicaid coverage for the cost of medications. TOC can not enter MATCH for medication assistance for patient with medical coverage.        Expected Discharge Plan and Services         Expected Discharge Date: 07/08/22                                     Social Determinants of Health (SDOH) Interventions SDOH Screenings   Food Insecurity: Food Insecurity Present (07/06/2022)  Housing: High Risk (07/06/2022)  Transportation Needs: Unmet Transportation Needs (07/06/2022)  Utilities: At Risk (07/06/2022)  Alcohol Screen: Low Risk  (08/12/2021)  Depression (PHQ2-9): High Risk (07/02/2022)  Tobacco Use: High Risk (07/06/2022)    Readmission Risk Interventions    11/07/2021    3:11 PM  Readmission Risk Prevention Plan  Medication Review (RN Care Manager) Complete  HRI or Home Care Consult Complete  SW Recovery Care/Counseling Consult Complete  Palliative Care Screening Not [place] Not Applicable

## 2022-07-08 NOTE — Discharge Summary (Signed)
Physician Discharge Summary  [name] [name]. ZOX:[id number] DOB: 02-Jan-2001 DOA: 07/06/2022  PCP: Evelena Leyden, DO Family medicine patient and should follow-up with them at [facility] ensure patient goes to that campus if patient needs to be admitted  Admit date: 07/06/2022 Discharge date: 07/08/2022  Time spent: 26 minutes  Recommendations for Outpatient Follow-up:  Patient counseled on noncompliance will need further outpatient follow-up with PCP Refilled all meds including glucometer as patient was noncompliant with them for the past 2 months Caution should be exercised with psychotropic meds-should follow-up with PCP and have this coordinated  Discharge Diagnoses:  MAIN problem for hospitalization   DKA on admission Influenza B with no respiratory symptoms Methamphetamine abuse Seizure disorder bipolar Hep C Prior Crohn's disease Questionable diagnosis of Tourette's (cannot find documentation anywhere in chart)  Please see below for itemized issues addressed in HOpsital- refer to other progress notes for clarity if needed  Discharge Condition: Improved  Diet recommendation: Diabetic  Filed Weights   07/06/22 2322  Weight: 74 kg    History of present illness:  22 year old homeless male DM TY 1, methamphetamine use, history of seizure, bipolar (unclear if on meds), hep C, potential prior Crohn's disease, GT CT with no recent seizures Admitted with DKA sugars 700 in the setting of noncompliance--- had recently establish care with the family medicine clinic over at [facility], was seen by them on 07/02/2022 prescribed glucometer and was told to try to be compliant   Presented to [facility] ED with hyperglycemia abdominal pain and back pain sugars in the 500s, initial anion gap 16, blood gas reassuring 7.3/35/48 WBC 3.7 platelet 202  He still has not obtained a glucometer, later in the hospitalization he admitted to being noncompliant on his insulin and  diabetic regimen for the past 2 months because of homelessness and insecurity    Also found to be flu B [+] but with no respiratory symptoms  Patient's DKA was treated with insulin gtt., he is still ranged in the 200-300 range at times during hospitalization He was prescribed Lantus FlexPen 30 units twice daily and NovoLog FlexPen with carb counting 0 to 20 units He was given a limited prescription of his Zoloft as well as his Seroquel - We refilled his lisinopril  He tells me his plan is to go to his father's house for the next day and a half until he can sort out a plan-I have called in all of his prescriptions to the [facility] outpatient pharmacy where he should be able to obtain them at a subsidized cost and substitutions are permitted  He was also given a prescription for Tamiflu to complete therapy although he had no real respiratory symptoms  I have CCed his PCP who should follow-up with him and he knows to call them     Discharge Exam: Vitals:   07/07/22 2354 07/08/22 0512  BP: (!) 136/96 (!) 141/88  Pulse: 90 73  Resp: 16   Temp: 97.9 F (36.6 C) 98.2 F (36.8 C)  SpO2: 100% 97%   Awake coherent pleasant no distress Sitting up in the bed Abdomen soft no rebound no guarding Chest clear S1-S2 no murmur ROM intact  Discharge Instructions    Allergies as of 07/08/2022       Reactions   Bee Venom Anaphylaxis   Fish Allergy Anaphylaxis   Shellfish Allergy Anaphylaxis   Tea Anaphylaxis        Medication List     STOP taking these  medications    EPINEPHrine 0.3 mg/0.3 mL Soaj injection Commonly known as: EPI-PEN   multivitamin with minerals Tabs tablet   tamsulosin 0.4 MG Caps capsule Commonly known as: FLOMAX       TAKE these medications    Accu-Chek Guide test strip Generic drug: glucose blood Use as instructed   Accu-Chek Softclix Lancets lancets Use as instructed   blood glucose meter kit and supplies Dispense based on patient and  insurance preference. Use up to four times daily as directed. (FOR ICD-10 E10.9, E11.9). What changed:  medication strength how much to take how to take this when to take this additional instructions   insulin glargine 100 UNIT/ML Solostar Pen Commonly known as: LANTUS Inject 30 Units into the skin in the morning and at bedtime.   lisinopril 10 MG tablet Commonly known as: ZESTRIL Take 1 tablet (10 mg total) by mouth daily.   NovoLOG FlexPen 100 UNIT/ML FlexPen Generic drug: insulin aspart Inject 0-20 Units into the skin 3 (three) times daily with meals. What changed: Another medication with the same name was removed. Continue taking this medication, and follow the directions you see here.   oseltamivir 75 MG capsule Commonly known as: TAMIFLU Take 1 capsule (75 mg total) by mouth 2 (two) times daily for 4 days.   QUEtiapine 50 MG tablet Commonly known as: SEROQUEL Take 1.5 tablets (75 mg total) by mouth at bedtime. What changed: medication strength   sertraline 100 MG tablet Commonly known as: ZOLOFT Take 1 tablet (100 mg total) by mouth daily.   VITAMIN B12 PO Take 1 tablet by mouth in the morning and at bedtime.       Allergies  Allergen Reactions   Bee Venom Anaphylaxis   Fish Allergy Anaphylaxis   Shellfish Allergy Anaphylaxis   Tea Anaphylaxis      The results of significant diagnostics from this hospitalization (including imaging, microbiology, ancillary and laboratory) are listed below for reference.    Significant Diagnostic Studies: CT ABDOMEN PELVIS W CONTRAST  Result Date: 07/06/2022 CLINICAL DATA:  Acute abdominal pain. EXAM: CT ABDOMEN AND PELVIS WITH CONTRAST TECHNIQUE: Multidetector CT imaging of the abdomen and pelvis was performed using the standard protocol following bolus administration of intravenous contrast. RADIATION DOSE REDUCTION: This exam was performed according to the departmental dose-optimization program which includes automated  exposure control, adjustment of the mA and/or kV according to patient size and/or use of iterative reconstruction technique. CONTRAST:  OMNIPAQUE IOHEXOL 300 MG/ML  SOLN COMPARISON:  CT chest abdomen and pelvis 04/13/2022 FINDINGS: Lower chest: There are tree-in-bud opacities in the right lower lobe. Hepatobiliary: The liver is mildly enlarged, unchanged. Gallbladder and bile ducts are within normal limits. Pancreas: Unremarkable. No pancreatic ductal dilatation or surrounding inflammatory changes. Spleen: Normal in size without focal abnormality. Adrenals/Urinary Tract: The bladder is markedly distended. Kidneys and adrenal glands are within normal limits. Stomach/Bowel: Stomach is within normal limits. Appendix appears normal. No evidence of bowel wall thickening, distention, or inflammatory changes. There is a large amount of stool throughout the colon. Vascular/Lymphatic: No significant vascular findings are present. No enlarged abdominal or pelvic lymph nodes. Reproductive: Prostate is unremarkable. Other: No abdominal wall hernia or abnormality. No abdominopelvic ascites. Musculoskeletal: No acute or significant osseous findings. IMPRESSION: 1. No acute localizing process in the abdomen or pelvis. 2. Marked distention of the bladder. 3. Large amount of stool throughout the colon. 4. Tree-in-bud opacities in the right lower lobe, likely infectious/inflammatory. Electronically Signed   By: Linton Rump  Dagoberto Reef M.D.   On: 07/06/2022 20:04    Microbiology: Recent Results (from the past 240 hour(s))  MRSA Next Gen by PCR, Nasal     Status: None   Collection Time: 07/06/22 11:17 PM   Specimen: Nasal Mucosa; Nasal Swab  Result Value Ref Range Status   MRSA by PCR Next Gen NOT DETECTED NOT DETECTED Final    Comment: (NOTE) The GeneXpert MRSA Assay (FDA approved for NASAL specimens only), is one component of a comprehensive MRSA colonization surveillance program. It is not intended to diagnose MRSA infection  nor to guide or monitor treatment for MRSA infections. Test performance is not FDA approved in patients less than 21 years old. Performed at [facility], [place] [address] [place], [place] [address]   Resp panel by RT-PCR (RSV, Flu A&B, Covid) Anterior Nasal Swab     Status: Abnormal   Collection Time: 07/07/22  1:32 AM   Specimen: Anterior Nasal Swab  Result Value Ref Range Status   SARS Coronavirus 2 by RT PCR NEGATIVE NEGATIVE Final    Comment: (NOTE) SARS-CoV-2 target nucleic acids are NOT DETECTED.  The SARS-CoV-2 RNA is generally detectable in upper respiratory specimens during the acute phase of infection. The lowest concentration of SARS-CoV-2 viral copies this assay can detect is 138 copies/mL. A negative result does not preclude SARS-Cov-2 infection and should not be used as the sole basis for treatment or other patient management decisions. A negative result may occur with  improper specimen collection/handling, submission of specimen other than nasopharyngeal swab, presence of viral mutation(s) within the areas targeted by this assay, and inadequate number of viral copies(<138 copies/mL). A negative result must be combined with clinical observations, patient history, and epidemiological information. The expected result is Negative.  Fact Sheet for Patients:  EntrepreneurPulse.com.au  Fact Sheet for Healthcare Providers:  IncredibleEmployment.be  This test is no t yet approved or cleared by the [place] FDA and  has been authorized for detection and/or diagnosis of SARS-CoV-2 by FDA under an Emergency Use Authorization (EUA). This EUA will remain  in effect (meaning this test can be used) for the duration of the COVID-19 declaration under Section 564(b)(1) of the Act, 21 U.S.C.section 360bbb-3(b)(1), unless the authorization is terminated  or revoked sooner.       Influenza A by PCR NEGATIVE NEGATIVE Final    Influenza B by PCR POSITIVE (A) NEGATIVE Final    Comment: (NOTE) The Xpert Xpress SARS-CoV-2/FLU/RSV plus assay is intended as an aid in the diagnosis of influenza from Nasopharyngeal swab specimens and should not be used as a sole basis for treatment. Nasal washings and aspirates are unacceptable for Xpert Xpress SARS-CoV-2/FLU/RSV testing.  Fact Sheet for Patients: EntrepreneurPulse.com.au  Fact Sheet for Healthcare Providers: IncredibleEmployment.be  This test is not yet approved or cleared by the [place] FDA and has been authorized for detection and/or diagnosis of SARS-CoV-2 by FDA under an Emergency Use Authorization (EUA). This EUA will remain in effect (meaning this test can be used) for the duration of the COVID-19 declaration under Section 564(b)(1) of the Act, 21 U.S.C. section 360bbb-3(b)(1), unless the authorization is terminated or revoked.     Resp Syncytial Virus by PCR NEGATIVE NEGATIVE Final    Comment: (NOTE) Fact Sheet for Patients: EntrepreneurPulse.com.au  Fact Sheet for Healthcare Providers: IncredibleEmployment.be  This test is not yet approved or cleared by the [place] FDA and has been authorized for detection and/or diagnosis of SARS-CoV-2 by FDA under an Emergency  Use Authorization (EUA). This EUA will remain in effect (meaning this test can be used) for the duration of the COVID-19 declaration under Section 564(b)(1) of the Act, 21 U.S.C. section 360bbb-3(b)(1), unless the authorization is terminated or revoked.  Performed at [facility], [address] [place], [place] [address]      Labs: Basic Metabolic Panel: Recent Labs  Lab 07/06/22 1715 07/06/22 2238 07/07/22 0145  NA 125* 134* 134*  K 4.5 3.0* 3.1*  CL 92* 103 102  CO2 17* 24 24  GLUCOSE 733* 186* 124*  BUN 16 11 7   CREATININE 1.08 0.66 0.53*  CALCIUM 7.7* 7.5* 7.4*    Liver Function Tests: Recent Labs  Lab 07/06/22 1715  AST 71*  ALT 66*  ALKPHOS 119  BILITOT 0.8  PROT 6.4*  ALBUMIN 2.9*   Recent Labs  Lab 07/06/22 1715  LIPASE 40   No results for input(s): "AMMONIA" in the last 168 hours. CBC: Recent Labs  Lab 07/06/22 1715  WBC 3.7*  NEUTROABS 2.1  HGB 13.9  HCT 41.5  MCV 78.9*  PLT 202   Cardiac Enzymes: No results for input(s): "CKTOTAL", "CKMB", "CKMBINDEX", "TROPONINI" in the last 168 hours. BNP: BNP (last 3 results) No results for input(s): "BNP" in the last 8760 hours.  ProBNP (last 3 results) No results for input(s): "PROBNP" in the last 8760 hours.  CBG: Recent Labs  Lab 07/07/22 0810 07/07/22 1142 07/07/22 1631 07/07/22 2140 07/08/22 0731  GLUCAP 337* 211* 408* 253* 244*       Signed:  09/06/22 MD   [facility] 07/08/2022, 8:56 AM

## 2022-07-10 ENCOUNTER — Other Ambulatory Visit (HOSPITAL_COMMUNITY): Payer: Self-pay

## 2022-07-23 ENCOUNTER — Other Ambulatory Visit (HOSPITAL_COMMUNITY): Payer: Self-pay

## 2022-08-26 ENCOUNTER — Ambulatory Visit: Payer: Self-pay | Admitting: Neurology

## 2022-09-08 ENCOUNTER — Telehealth: Payer: Self-pay

## 2022-09-08 NOTE — Transitions of Care (Post Inpatient/ED Visit) (Unsigned)
   09/08/2022  Name: [name] [name]. MRN: [id number] DOB: [birth year]  Today's TOC FU Call Status: Today's TOC FU Call Status:: Unsuccessul Call (1st Attempt) Unsuccessful Call (1st Attempt) Date: 09/08/22  Attempted to reach the patient regarding the most recent Inpatient/ED visit.  Follow Up Plan: Additional outreach attempts will be made to reach the patient to complete the Transitions of Care (Post Inpatient/ED visit) call.   Signature Juanda Crumble, [place] Direct Dial [phone]

## 2022-09-09 NOTE — Transitions of Care (Post Inpatient/ED Visit) (Signed)
   09/09/2022  Name: [name] [name]. MRN: [id number] DOB: October 25, 2000  Today's TOC FU Call Status: Today's TOC FU Call Status:: Unsuccessful Call (2nd Attempt) Unsuccessful Call (1st Attempt) Date: 09/08/22 Unsuccessful Call (2nd Attempt) Date: 09/09/22  Attempted to reach the patient regarding the most recent Inpatient/ED visit.  Follow Up Plan: No further outreach attempts will be made at this time. We have been unable to contact the patient.  Signature Juanda Crumble, [place] Direct Dial [phone]

## 2022-09-15 ENCOUNTER — Telehealth: Payer: Self-pay

## 2022-09-15 NOTE — Transitions of Care (Post Inpatient/ED Visit) (Signed)
   09/15/2022  Name: [name] [name]. MRN: [id number] DOB: 06/27/2001  Today's TOC FU Call Status: Today's TOC FU Call Status:: Unsuccessul Call (1st Attempt) Unsuccessful Call (1st Attempt) Date: 09/15/22  Attempted to reach the patient regarding the most recent Inpatient/ED visit.  Follow Up Plan: Additional outreach attempts will be made to reach the patient to complete the Transitions of Care (Post Inpatient/ED visit) call.   Signature Juanda Crumble, [place] Direct Dial [phone]

## 2022-09-16 NOTE — Transitions of Care (Post Inpatient/ED Visit) (Unsigned)
   09/16/2022  Name: [name] [name]. MRN: [id number] DOB: 2000/08/08  Today's TOC FU Call Status: Today's TOC FU Call Status:: Unsuccessul Call (1st Attempt) Unsuccessful Call (1st Attempt) Date: 09/15/22 Unsuccessful Call (2nd Attempt) Date: 09/16/22  Attempted to reach the patient regarding the most recent Inpatient/ED visit.  Follow Up Plan: Additional outreach attempts will be made to reach the patient to complete the Transitions of Care (Post Inpatient/ED visit) call.   Signature Juanda Crumble, [place] Direct Dial [phone]

## 2022-09-17 NOTE — Transitions of Care (Post Inpatient/ED Visit) (Signed)
   09/17/2022  Name: [name] [name]. MRN: [id number] DOB: 2000-12-20  Today's TOC FU Call Status: Today's TOC FU Call Status:: Unsuccessful Call (3rd Attempt) Unsuccessful Call (1st Attempt) Date: 09/15/22 Unsuccessful Call (2nd Attempt) Date: 09/16/22 Unsuccessful Call (3rd Attempt) Date: 09/17/22  Attempted to reach the patient regarding the most recent Inpatient/ED visit.  Follow Up Plan: No further outreach attempts will be made at this time. We have been unable to contact the patient.  Signature Juanda Crumble, [place] Direct Dial [phone]

## 2022-09-26 ENCOUNTER — Other Ambulatory Visit: Payer: Self-pay

## 2022-09-26 ENCOUNTER — Other Ambulatory Visit (HOSPITAL_COMMUNITY): Payer: Self-pay

## 2022-09-27 ENCOUNTER — Other Ambulatory Visit (HOSPITAL_COMMUNITY): Payer: Self-pay

## 2022-09-29 ENCOUNTER — Other Ambulatory Visit (HOSPITAL_COMMUNITY): Payer: Self-pay

## 2022-10-01 ENCOUNTER — Other Ambulatory Visit: Payer: Self-pay

## 2022-10-07 ENCOUNTER — Other Ambulatory Visit: Payer: Self-pay

## 2022-10-31 ENCOUNTER — Ambulatory Visit: Payer: Medicaid Other | Admitting: Family Medicine

## 2022-11-08 ENCOUNTER — Inpatient Hospital Stay (HOSPITAL_COMMUNITY): Payer: Medicaid Other

## 2022-11-08 ENCOUNTER — Inpatient Hospital Stay (HOSPITAL_COMMUNITY)
Admission: EM | Admit: 2022-11-08 | Discharge: 2022-11-10 | DRG: 638 | Disposition: A | Discharge status: Home / Self Care | Payer: Medicaid Other | Attending: Internal Medicine | Admitting: Internal Medicine

## 2022-11-08 ENCOUNTER — Other Ambulatory Visit: Payer: Self-pay

## 2022-11-08 ENCOUNTER — Encounter (HOSPITAL_COMMUNITY): Payer: Self-pay | Admitting: Emergency Medicine

## 2022-11-08 DIAGNOSIS — E1010 Type 1 diabetes mellitus with ketoacidosis without coma: Principal | ICD-10-CM | POA: Diagnosis present

## 2022-11-08 DIAGNOSIS — Z5900 Homelessness unspecified: Secondary | ICD-10-CM

## 2022-11-08 DIAGNOSIS — R451 Restlessness and agitation: Secondary | ICD-10-CM | POA: Diagnosis present

## 2022-11-08 DIAGNOSIS — Z8619 Personal history of other infectious and parasitic diseases: Secondary | ICD-10-CM | POA: Diagnosis not present

## 2022-11-08 DIAGNOSIS — F419 Anxiety disorder, unspecified: Secondary | ICD-10-CM | POA: Diagnosis present

## 2022-11-08 DIAGNOSIS — I10 Essential (primary) hypertension: Secondary | ICD-10-CM | POA: Diagnosis present

## 2022-11-08 DIAGNOSIS — Z83438 Family history of other disorder of lipoprotein metabolism and other lipidemia: Secondary | ICD-10-CM

## 2022-11-08 DIAGNOSIS — Z825 Family history of asthma and other chronic lower respiratory diseases: Secondary | ICD-10-CM

## 2022-11-08 DIAGNOSIS — M549 Dorsalgia, unspecified: Secondary | ICD-10-CM | POA: Diagnosis present

## 2022-11-08 DIAGNOSIS — D72829 Elevated white blood cell count, unspecified: Secondary | ICD-10-CM | POA: Diagnosis present

## 2022-11-08 DIAGNOSIS — Z8249 Family history of ischemic heart disease and other diseases of the circulatory system: Secondary | ICD-10-CM | POA: Diagnosis not present

## 2022-11-08 DIAGNOSIS — Z91138 Patient's unintentional underdosing of medication regimen for other reason: Secondary | ICD-10-CM | POA: Diagnosis not present

## 2022-11-08 DIAGNOSIS — F17210 Nicotine dependence, cigarettes, uncomplicated: Secondary | ICD-10-CM | POA: Diagnosis present

## 2022-11-08 DIAGNOSIS — Z79899 Other long term (current) drug therapy: Secondary | ICD-10-CM | POA: Diagnosis not present

## 2022-11-08 DIAGNOSIS — R112 Nausea with vomiting, unspecified: Secondary | ICD-10-CM | POA: Diagnosis present

## 2022-11-08 DIAGNOSIS — T383X6A Underdosing of insulin and oral hypoglycemic [antidiabetic] drugs, initial encounter: Secondary | ICD-10-CM | POA: Diagnosis present

## 2022-11-08 DIAGNOSIS — R1110 Vomiting, unspecified: Secondary | ICD-10-CM | POA: Diagnosis present

## 2022-11-08 DIAGNOSIS — G8929 Other chronic pain: Secondary | ICD-10-CM | POA: Diagnosis present

## 2022-11-08 DIAGNOSIS — E1110 Type 2 diabetes mellitus with ketoacidosis without coma: Secondary | ICD-10-CM | POA: Diagnosis present

## 2022-11-08 DIAGNOSIS — Z8719 Personal history of other diseases of the digestive system: Secondary | ICD-10-CM

## 2022-11-08 DIAGNOSIS — Z8669 Personal history of other diseases of the nervous system and sense organs: Secondary | ICD-10-CM

## 2022-11-08 DIAGNOSIS — Z794 Long term (current) use of insulin: Secondary | ICD-10-CM | POA: Diagnosis not present

## 2022-11-08 DIAGNOSIS — Z833 Family history of diabetes mellitus: Secondary | ICD-10-CM | POA: Diagnosis not present

## 2022-11-08 DIAGNOSIS — F32A Depression, unspecified: Secondary | ICD-10-CM | POA: Diagnosis present

## 2022-11-08 LAB — GLUCOSE, CAPILLARY
Glucose-Capillary: 239 mg/dL — ABNORMAL HIGH (ref 70–99)
Glucose-Capillary: 226 mg/dL — ABNORMAL HIGH (ref 70–99)

## 2022-11-08 LAB — HEMOGLOBIN A1C
Hgb A1c MFr Bld: 9.5 % — ABNORMAL HIGH (ref 4.8–5.6)
Mean Plasma Glucose: 225.95 mg/dL

## 2022-11-08 LAB — BASIC METABOLIC PANEL
Anion gap: 25 — ABNORMAL HIGH (ref 5–15)
BUN: 25 mg/dL — ABNORMAL HIGH (ref 6–20)
BUN: 24 mg/dL — ABNORMAL HIGH (ref 6–20)
BUN: 20 mg/dL (ref 6–20)
CO2: <7 mmol/L — ABNORMAL LOW (ref 22–32)
CO2: <7 mmol/L — ABNORMAL LOW (ref 22–32)
CO2: 9 mmol/L — ABNORMAL LOW (ref 22–32)
Calcium: 8.1 mg/dL — ABNORMAL LOW (ref 8.9–10.3)
Calcium: 8.2 mg/dL — ABNORMAL LOW (ref 8.9–10.3)
Calcium: 8.6 mg/dL — ABNORMAL LOW (ref 8.9–10.3)
Chloride: 93 mmol/L — ABNORMAL LOW (ref 98–111)
Chloride: 100 mmol/L (ref 98–111)
Chloride: 105 mmol/L (ref 98–111)
Creatinine, Ser: 1.58 mg/dL — ABNORMAL HIGH (ref 0.61–1.24)
Creatinine, Ser: 1.5 mg/dL — ABNORMAL HIGH (ref 0.61–1.24)
Creatinine, Ser: 1.21 mg/dL (ref 0.61–1.24)
GFR, Estimated: >60 mL/min (ref >60)
GFR, Estimated: >60 mL/min (ref >60)
GFR, Estimated: >60 mL/min (ref >60)
Glucose, Bld: 884 mg/dL (ref 70–99)
Glucose, Bld: 561 mg/dL (ref 70–99)
Glucose, Bld: 236 mg/dL — ABNORMAL HIGH (ref 70–99)
Potassium: 5.3 mmol/L — ABNORMAL HIGH (ref 3.5–5.1)
Potassium: 4.4 mmol/L (ref 3.5–5.1)
Potassium: 4.2 mmol/L (ref 3.5–5.1)
Sodium: 131 mmol/L — ABNORMAL LOW (ref 135–145)
Sodium: 135 mmol/L (ref 135–145)
Sodium: 139 mmol/L (ref 135–145)

## 2022-11-08 LAB — I-STAT CHEM 8, ED
BUN: 24 mg/dL — ABNORMAL HIGH (ref 6–20)
Calcium, Ion: 1.2 mmol/L (ref 1.15–1.40)
Chloride: 104 mmol/L (ref 98–111)
Creatinine, Ser: 0.7 mg/dL (ref 0.61–1.24)
Glucose, Bld: >700 mg/dL (ref 70–99)
HCT: 54 % — ABNORMAL HIGH (ref 39.0–52.0)
Hemoglobin: 18.4 g/dL — ABNORMAL HIGH (ref 13.0–17.0)
Potassium: 5.5 mmol/L — ABNORMAL HIGH (ref 3.5–5.1)
Sodium: 132 mmol/L — ABNORMAL LOW (ref 135–145)
TCO2: 8 mmol/L — ABNORMAL LOW (ref 22–32)

## 2022-11-08 LAB — CBC WITH DIFFERENTIAL/PLATELET
Abs Immature Granulocytes: 0.76 10*3/uL — ABNORMAL HIGH (ref 0.00–0.07)
Basophils Absolute: 0.2 10*3/uL — ABNORMAL HIGH (ref 0.0–0.1)
Basophils Relative: 1 %
Eosinophils Absolute: 0 10*3/uL (ref 0.0–0.5)
Eosinophils Relative: 0 %
HCT: 52.5 % — ABNORMAL HIGH (ref 39.0–52.0)
Hemoglobin: 16 g/dL (ref 13.0–17.0)
Immature Granulocytes: 4 %
Lymphocytes Relative: 25 %
Lymphs Abs: 5.1 10*3/uL — ABNORMAL HIGH (ref 0.7–4.0)
MCH: 29.1 pg (ref 26.0–34.0)
MCHC: 30.5 g/dL (ref 30.0–36.0)
MCV: 95.6 fL (ref 80.0–100.0)
Monocytes Absolute: 0.5 10*3/uL (ref 0.1–1.0)
Monocytes Relative: 3 %
Neutro Abs: 13.6 10*3/uL — ABNORMAL HIGH (ref 1.7–7.7)
Neutrophils Relative %: 67 %
Platelets: 482 10*3/uL — ABNORMAL HIGH (ref 150–400)
RBC: 5.49 MIL/uL (ref 4.22–5.81)
RDW: 12.6 % (ref 11.5–15.5)
WBC: 20.1 10*3/uL — ABNORMAL HIGH (ref 4.0–10.5)
nRBC: 0 % (ref 0.0–0.2)

## 2022-11-08 LAB — RAPID URINE DRUG SCREEN, HOSP PERFORMED
Amphetamines: NOT DETECTED
Barbiturates: NOT DETECTED
Benzodiazepines: NOT DETECTED
Cocaine: NOT DETECTED
Opiates: POSITIVE — AB
Tetrahydrocannabinol: NOT DETECTED

## 2022-11-08 LAB — CBG MONITORING, ED
Glucose-Capillary: >600 mg/dL (ref 70–99)
Glucose-Capillary: >600 mg/dL (ref 70–99)
Glucose-Capillary: >600 mg/dL (ref 70–99)
Glucose-Capillary: >600 mg/dL (ref 70–99)
Glucose-Capillary: >600 mg/dL (ref 70–99)
Glucose-Capillary: >600 mg/dL (ref 70–99)
Glucose-Capillary: 495 mg/dL — ABNORMAL HIGH (ref 70–99)
Glucose-Capillary: >600 mg/dL (ref 70–99)
Glucose-Capillary: 422 mg/dL — ABNORMAL HIGH (ref 70–99)

## 2022-11-08 LAB — URINALYSIS, ROUTINE W REFLEX MICROSCOPIC
Bacteria, UA: NONE SEEN
Bilirubin Urine: NEGATIVE
Glucose, UA: >=500 mg/dL — AB
Ketones, ur: 80 mg/dL — AB
Leukocytes,Ua: NEGATIVE
Nitrite: NEGATIVE
Protein, ur: NEGATIVE mg/dL
Specific Gravity, Urine: 1.015 (ref 1.005–1.030)
pH: 5 (ref 5.0–8.0)

## 2022-11-08 LAB — BLOOD GAS, VENOUS
Acid-base deficit: 27.3 mmol/L — ABNORMAL HIGH (ref 0.0–2.0)
Bicarbonate: 4.6 mmol/L — ABNORMAL LOW (ref 20.0–28.0)
O2 Saturation: 77 %
Patient temperature: 37
pCO2, Ven: 23 mmHg — ABNORMAL LOW (ref 44–60)
pH, Ven: <6.95 — CL (ref 7.25–7.43)
pO2, Ven: 56 mmHg — ABNORMAL HIGH (ref 32–45)

## 2022-11-08 LAB — MRSA NEXT GEN BY PCR, NASAL: MRSA by PCR Next Gen: NOT DETECTED

## 2022-11-08 LAB — BETA-HYDROXYBUTYRIC ACID
Beta-Hydroxybutyric Acid: >8 mmol/L — ABNORMAL HIGH (ref 0.05–0.27)
Beta-Hydroxybutyric Acid: >8 mmol/L — ABNORMAL HIGH (ref 0.05–0.27)

## 2022-11-08 LAB — HIV ANTIBODY (ROUTINE TESTING W REFLEX): HIV Screen 4th Generation wRfx: NONREACTIVE

## 2022-11-08 MED ORDER — DROPERIDOL 2.5 MG/ML IJ SOLN
1.2500 mg | Freq: Once | INTRAMUSCULAR | Status: AC
  Administered 2022-11-08: 1.25 mg via INTRAVENOUS
  Filled 2022-11-08: qty 2

## 2022-11-08 MED ORDER — MORPHINE SULFATE (PF) 4 MG/ML IV SOLN
4.0000 mg | Freq: Once | INTRAVENOUS | Status: AC
  Administered 2022-11-08: 4 mg via INTRAMUSCULAR

## 2022-11-08 MED ORDER — INSULIN REGULAR(HUMAN) IN NACL 100-0.9 UT/100ML-% IV SOLN
INTRAVENOUS | Status: DC
  Administered 2022-11-08: 11 [IU]/h via INTRAVENOUS
  Filled 2022-11-08: qty 100

## 2022-11-08 MED ORDER — SODIUM CHLORIDE 0.9 % IV SOLN: 12.5000 mg | Freq: Four times a day (QID) | INTRAVENOUS | Status: DC | PRN

## 2022-11-08 MED ORDER — LACTATED RINGERS IV BOLUS
2000.0000 mL | Freq: Once | INTRAVENOUS | Status: AC
  Administered 2022-11-08: 2000 mL via INTRAVENOUS

## 2022-11-08 MED ORDER — LACTATED RINGERS IV SOLN: INTRAVENOUS | Status: DC

## 2022-11-08 MED ORDER — FENTANYL CITRATE PF 50 MCG/ML IJ SOSY
50.0000 ug | PREFILLED_SYRINGE | Freq: Once | INTRAMUSCULAR | Status: AC
  Administered 2022-11-08: 50 ug via INTRAVENOUS
  Filled 2022-11-08: qty 1

## 2022-11-08 MED ORDER — DEXTROSE IN LACTATED RINGERS 5 % IV SOLN: INTRAVENOUS | Status: DC

## 2022-11-08 MED ORDER — ORAL CARE MOUTH RINSE: 15.0000 mL | OROMUCOSAL | Status: DC | PRN

## 2022-11-08 MED ORDER — ENOXAPARIN SODIUM 30 MG/0.3ML IJ SOSY
30.0000 mg | PREFILLED_SYRINGE | Freq: Every day | INTRAMUSCULAR | Status: DC
  Administered 2022-11-08: 30 mg via SUBCUTANEOUS
  Filled 2022-11-08: qty 0.3

## 2022-11-08 MED ORDER — CHLORHEXIDINE GLUCONATE CLOTH 2 % EX PADS
6.0000 | MEDICATED_PAD | Freq: Every day | CUTANEOUS | Status: DC
  Administered 2022-11-09: 6 via TOPICAL

## 2022-11-08 MED ORDER — INSULIN REGULAR(HUMAN) IN NACL 100-0.9 UT/100ML-% IV SOLN
INTRAVENOUS | Status: DC
  Administered 2022-11-08: 18 [IU]/h via INTRAVENOUS
  Filled 2022-11-08: qty 100

## 2022-11-08 MED ORDER — LACTATED RINGERS IV BOLUS: 20.0000 mL/kg | Freq: Once | INTRAVENOUS | Status: DC

## 2022-11-08 MED ORDER — DEXTROSE 50 % IV SOLN: 0.0000 mL | INTRAVENOUS | Status: DC | PRN

## 2022-11-08 MED ORDER — MORPHINE SULFATE (PF) 2 MG/ML IV SOLN
2.0000 mg | INTRAVENOUS | Status: AC | PRN
  Administered 2022-11-08 – 2022-11-09 (×3): 2 mg via INTRAVENOUS
  Filled 2022-11-08 (×3): qty 1

## 2022-11-08 MED ORDER — POTASSIUM CHLORIDE 10 MEQ/100ML IV SOLN
10.0000 meq | INTRAVENOUS | Status: DC
  Filled 2022-11-08: qty 100

## 2022-11-08 MED ORDER — SODIUM BICARBONATE 8.4 % IV SOLN
50.0000 meq | Freq: Once | INTRAVENOUS | Status: AC
  Administered 2022-11-08: 50 meq via INTRAVENOUS
  Filled 2022-11-08: qty 50

## 2022-11-08 MED ORDER — MORPHINE SULFATE (PF) 4 MG/ML IV SOLN
4.0000 mg | Freq: Once | INTRAVENOUS | Status: DC
  Filled 2022-11-08: qty 1

## 2022-11-08 MED ORDER — ONDANSETRON HCL 4 MG/2ML IJ SOLN
4.0000 mg | Freq: Once | INTRAMUSCULAR | Status: AC
  Administered 2022-11-08: 4 mg via INTRAVENOUS
  Filled 2022-11-08: qty 2

## 2022-11-08 NOTE — H&P (Signed)
NAME:  [name] Harcum., MRN:  [id number], DOB:  [birth year], LOS: 0 ADMISSION DATE:  11/08/2022, CONSULTATION DATE:  11/08/2022  REFERRING MD:  Kommor, EDP, CHIEF COMPLAINT: Abdominal pain and nausea  History of Present Illness:  22 year old man with type 1 diabetes, brought in by EMS , with complaints of severe nausea, vomiting and bilateral flank pain for 3 days. His CBG was 432 by EMS and in the ED was more than 700 .he had rapid deep respirations and tachycardia with dry mucous membranes.  He reported that his last insulin use was few days ago. Initial labs showed potassium 5.5, glucose more than 700 bicarbonate of 8 with venous pH of 6.95.  He was given 1 amp of bicarb started on insulin drip, given 2 L of fluids.  He was given droperidol for severe nausea.  He was intermittently agitated and trying to get off the ED stretcher, hence PCCM consulted.  He has a history of  prior recurrent admits for DKA  Pertinent  Medical History  Homelessness Type 1 diabetes Seizure disorder Bipolar Hep C Crohn's disease Questionable diagnosis of Tourette's Substance abuse methamphetamine  Significant Hospital Events: Including procedures, antibiotic start and stop dates in addition to other pertinent events     Interim History / Subjective:  Lying on ED stretcher and intermittently getting up Tachycardic Afebrile  Objective   Blood pressure (!) 99/50, pulse (!) 121, temperature 97.6 F (36.4 C), resp. rate (!) 24, SpO2 100 %.       No intake or output data in the 24 hours ending 11/08/22 1614 There were no vitals filed for this visit.  Examination: General: Young man, several tattoos, moderate distress, complains of nausea, otherwise nonverbal HENT: Pupils 4 mm reactive to light, no JVD, dry mucosa Lungs: Clear bilateral, tachypnea, mild accessory muscle use Cardiovascular: S1-S2 tacky no murmurs Abdomen: Soft, nontender, no hepatosplenomegaly Extremities: No edema, no deformity,  several tattoos Neuro: All 4 extremities, follows one-step commands, strong grip  Labs/show potassium 5.3, glucose 84, BUN/creatinine 25/1.6, leukocytosis, beta-hydroxybutyrate more than 8   Resolved Hospital Problem list     Assessment & Plan:  Severe DKA -most likely due to missed insulin , no detailed history available. Leukocytosis -likely reactive although will rule out infection with UA and chest x-ray  -DKA protocol, he has received 2 L of fluids and will give him 2 L more, maintain with 125/hour. Insulin drip per Endo tool. Blood sugars, less than 250, add dextrose to IV fluids. Monitor CBG and beta-hydroxybutyrate every 8 hours, BMET every 4 hours  Severe nausea -likely related to DKA Phenergan as needed Abdominal exam is benign, CT abdomen/pelvis negative in January but low threshold to rescan if persistent.  Psychomotor agitation -likely related to nausea and DKA Possible substance withdrawal -Using Phenergan currently. If worse consider low-dose Ativan and Precedex if does not respond Trying to avoid respiratory depressants  Best Practice (right click and "Reselect all SmartList Selections" daily)   Diet/type: NPO DVT prophylaxis: LMWH GI prophylaxis: PPI Lines: N/A Foley:  N/A Code Status:  full code Last date of multidisciplinary goals of care discussion [NA]  Labs   CBC: Recent Labs  Lab 11/08/22 1405 11/08/22 1416  WBC 20.1*  --   NEUTROABS 13.6*  --   HGB 16.0 18.4*  HCT 52.5* 54.0*  MCV 95.6  --   PLT 482*  --     Basic Metabolic Panel: Recent Labs  Lab 11/08/22 1416 11/08/22 1524  NA 132* 131*  K 5.5* 5.3*  CL 104 93*  CO2  --  <7*  GLUCOSE >700* 884*  BUN 24* 25*  CREATININE 0.70 1.58*  CALCIUM  --  8.1*   GFR: CrCl cannot be calculated (Unknown ideal weight.). Recent Labs  Lab 11/08/22 1405  WBC 20.1*    Liver Function Tests: No results for input(s): "AST", "ALT", "ALKPHOS", "BILITOT", "PROT", "ALBUMIN" in the last 168  hours. No results for input(s): "LIPASE", "AMYLASE" in the last 168 hours. No results for input(s): "AMMONIA" in the last 168 hours.  ABG    Component Value Date/Time   PHART 7.43 12/05/2021 2343   PCO2ART 38 12/05/2021 2343   PO2ART 65 (L) 12/05/2021 2343   HCO3 4.6 (L) 11/08/2022 1405   TCO2 8 (L) 11/08/2022 1416   ACIDBASEDEF 27.3 (H) 11/08/2022 1405   O2SAT 77 11/08/2022 1405     Coagulation Profile: No results for input(s): "INR", "PROTIME" in the last 168 hours.  Cardiac Enzymes: No results for input(s): "CKTOTAL", "CKMB", "CKMBINDEX", "TROPONINI" in the last 168 hours.  HbA1C: Hgb A1c MFr Bld  Date/Time Value Ref Range Status  04/14/2022 03:55 AM 9.3 (H) 4.8 - 5.6 % Final    Comment:    (NOTE) Pre diabetes:          5.7%-6.4%  Diabetes:              >6.4%  Glycemic control for   <7.0% adults with diabetes   12/15/2021 02:45 PM 9.8 (H) 4.8 - 5.6 % Final    Comment:    REPEATED TO VERIFY (NOTE) Pre diabetes:          5.7%-6.4%  Diabetes:              >6.4%  Glycemic control for   <7.0% adults with diabetes     CBG: Recent Labs  Lab 11/08/22 1353 11/08/22 1501 11/08/22 1534 11/08/22 1606  GLUCAP >600* >600* >600* >600*    Review of Systems:   Unable to obtain since mildly agitated  Past Medical History:  He,  has a past medical history of ADD (attention deficit disorder), Allergy, Anxiety, Asthma, Depression, DKA (diabetic ketoacidosis) (HCC) (11/10/2020), Epileptic seizure (HCC), Fracture of multiple ribs (05/11/2020), HCV (hepatitis C virus), Headache(784.0), Heart murmur, Homeless, Methamphetamine intoxication (HCC) (10/26/2018), Migraine, Migraine without status migrainosus, not intractable (12/13/2013), ODD (oppositional defiant disorder), Partial small bowel obstruction (HCC) (11/11/2021), SBO (small bowel obstruction) (HCC) (11/11/2021), Sickle cell trait (HCC), Suicidal ideation (10/06/2019), Suicidal ideations (03/31/2014), Type I diabetes  mellitus (HCC), and Vision abnormalities.   Surgical History:   Past Surgical History:  Procedure Laterality Date   CIRCUMCISION  2000   FINGER SURGERY Left 2001   "crushed pinky; had to repair it" (06/17/2013)     Social History:   reports that he has been smoking cigarettes. He has been smoking an average of .5 packs per day. He has never used smokeless tobacco. He reports current drug use. Drug: Methamphetamines. He reports that he does not drink alcohol.   Family History:  His family history includes Asthma in his mother; COPD in his mother; Diabetes in his maternal grandmother; Heart disease in his maternal grandfather and maternal grandmother; Hyperlipidemia in his paternal grandfather and paternal grandmother; Hypertension in his maternal grandmother; Mental illness in his maternal grandmother; Migraines in his sister; Other in his mother.   Allergies Allergies  Allergen Reactions   Bee Venom Anaphylaxis   Fish Allergy Anaphylaxis   Shellfish Allergy Anaphylaxis   Tea Anaphylaxis  Home Medications  Prior to Admission medications   Medication Sig Start Date End Date Taking? Authorizing Provider  Accu-Chek Softclix Lancets lancets Use as instructed 07/08/22   Rhetta Mura, MD  blood glucose meter kit and supplies Use up to four times daily as directed. 07/08/22   Rhetta Mura, MD  Cyanocobalamin (VITAMIN B12 PO) Take 1 tablet by mouth in the morning and at bedtime. Patient not taking: Reported on 07/07/2022    [provider]  glucose blood (ACCU-CHEK GUIDE) test strip Use as instructed 07/08/22   Rhetta Mura, MD  insulin aspart (NOVOLOG FLEXPEN) 100 UNIT/ML FlexPen Inject 0 - 20 Units into the skin 3 times daily with meals. 07/08/22   Rhetta Mura, MD  insulin glargine (LANTUS) 100 UNIT/ML Solostar Pen Inject 30 Units into the skin in the morning and at bedtime. 07/08/22   Rhetta Mura, MD  lisinopril (ZESTRIL) 10 MG tablet Take 1  tablet (10 mg total) by mouth daily. 07/08/22   Rhetta Mura, MD  QUEtiapine (SEROQUEL) 50 MG tablet Take 1 and 1/2 tablets (75 mg total) by mouth at bedtime. 07/08/22   Rhetta Mura, MD  sertraline (ZOLOFT) 100 MG tablet Take 1 tablet (100 mg total) by mouth daily. 07/08/22   Rhetta Mura, MD     Critical care time: 50m     Cyril Mourning MD. [facility]. [place] Pager : [phone]  If no response to pager , please call [phone] until 7 pm After 7:00 pm call Elink  [phone]   11/08/2022

## 2022-11-08 NOTE — Inpatient Diabetes Management (Signed)
Inpatient Diabetes Program Recommendations  AACE/ADA: New Consensus Statement on Inpatient Glycemic Control (2015)  Target Ranges:  Prepandial:   less than 140 mg/dL      Peak postprandial:   less than 180 mg/dL (1-2 hours)      Critically ill patients:  140 - 180 mg/dL   Lab Results  Component Value Date   GLUCAP >600 (HH) 11/08/2022   HGBA1C 9.3 (H) 04/14/2022    Review of Glycemic Control  Latest Reference Range & Units 11/08/22 16:06  Glucose-Capillary 70 - 99 mg/dL >161 (HH)   Diabetes history: DM 1- Frequent admits for DKA Outpatient Diabetes medications:  Novolog 0-20 units tid with meals Lantus 30 units bid Current orders for Inpatient glycemic control:  IV insulin/DKA orders  Inpatient Diabetes Program Recommendations:    Patient is very familiar to Inpatient DM team.  Agree with current orders. Will follow and speak to patient when appropriate.   Thanks  Beryl Meager, RN, BC-ADM Inpatient Diabetes Coordinator Pager [phone]  (8a-5p)

## 2022-11-08 NOTE — ED Triage Notes (Signed)
Patient presents post a stent from rehab with complaints of nausea, vomiting and bilateral flank pain which started 3 days ago. EMS believes he may have anxiety as well.    EMS vitals: 150/80 BP 112 HR 100% SPO2 on room air 432 CBG

## 2022-11-08 NOTE — ED Notes (Signed)
ED TO INPATIENT HANDOFF REPORT  Name/Age/Gender [name] [name]. 22 y.o. male  Code Status    Code Status Orders  (From admission, onward)           Start     Ordered   11/08/22 1610  Full code  Continuous       Question:  By:  Answer:  Default: patient does not have capacity for decision making, no surrogate or prior directive available   11/08/22 1612           Code Status History     Date Active Date Inactive Code Status Order ID Comments User Context   07/06/2022 2346 07/08/2022 1740 Full Code [id number]  Hillary Bow, DO Inpatient   04/14/2022 0355 04/15/2022 0116 Full Code [id number]  John Giovanni, MD ED   12/14/2021 2022 12/19/2021 1744 Full Code [id number]  [name] Emery, MD ED   12/06/2021 0200 12/06/2021 2021 Full Code [id number]  Carollee Herter, DO ED   11/30/2021 0839 12/04/2021 2346 Full Code [id number]  Jonah Blue, MD ED   11/17/2021 2350 11/20/2021 2322 Full Code [id number]  Briscoe Deutscher, MD ED   11/11/2021 0456 11/13/2021 1752 Full Code [id number]  Hillary Bow, DO ED   11/04/2021 1950 11/08/2021 1902 Full Code [id number]  Alba Cory, MD Inpatient   09/30/2021 0337 10/01/2021 2135 Full Code [id number]  Tomma Lightning, MD ED   08/12/2021 1530 08/15/2021 1729 Full Code [id number]  Maryagnes Amos, FNP Inpatient   12/10/2020 2353 12/11/2020 0519 Full Code [id number]  Howerter, Chaney Born, DO ED   11/10/2020 1542 11/12/2020 1814 Full Code [id number]  Lorin Glass, MD ED   11/10/2020 1531 11/10/2020 1542 Full Code [id number]  Delfin Gant, NP ED   11/09/2020 0552 11/10/2020 0021 Full Code [id number]  Hillary Bow, DO ED   09/14/2020 1302 09/14/2020 1859 Full Code [id number]  Pieter Partridge, MD ED   05/04/2020 0212 05/04/2020 0939 Full Code [id number]  Jackelyn Poling, NP ED   02/18/2020 0529 02/19/2020 0510 Full Code [id number]  Hillary Bow, DO ED   01/15/2020 2130 01/16/2020 1240 Full Code [id number]  Kathlene Cote, PA-C ED   10/05/2019 2119 10/06/2019  2041 Full Code [id number]  Jearld Lesch, NP Inpatient   09/28/2019 1601 09/28/2019 1658 Full Code [id number]  Oneta Rack, NP HOV   07/10/2019 0549 07/10/2019 2041 Full Code [id number]  Liborio Nixon, MD ED   10/26/2018 1514 10/26/2018 2138 Full Code [id number]  Lanelle Bal, MD ED   10/06/2018 1813 10/06/2018 2206 Partial Code [id number]  Yvette Rack, MD ED   09/25/2018 2030 09/26/2018 0501 Full Code [id number]  Yvette Rack, MD ED   09/24/2018 0215 09/24/2018 0556 Full Code [id number]  Eduard Clos, MD ED   09/18/2018 0044 09/19/2018 1413 Full Code [id number]  Paula Libra, MD ED   09/15/2018 1829 09/16/2018 1940 Full Code [id number]  Elgergawy, Leana Roe, MD Inpatient   09/02/2018 1408 09/06/2018 1909 Full Code [id number]  Tyrone Nine, MD Inpatient   08/31/2018 0432 08/31/2018 1837 Full Code [id number]  Lorretta Harp, MD ED   08/29/2018 1652 08/30/2018 0854 Full Code [id number]  Rolly Salter, MD Inpatient   08/18/2018 0828 08/20/2018 1612 Full Code [id number]  Osvaldo Shipper, MD ED   08/09/2018 0323 08/09/2018 2339 Full Code [id number]  Eduard Clos, MD ED   07/14/2018 1728 07/14/2018 2303 Full Code [id number]  Ward, Chase Picket, PA-C ED  04/14/2018 2323 04/19/2018 2143 Full Code [id number]  Elpidio Anis, PA-C ED   04/04/2018 1049 04/05/2018 2218 Full Code [id number]  Myrtie Neither, MD ED   04/02/2018 1311 04/03/2018 0010 Full Code [id number]  Elgergawy, Leana Roe, MD Inpatient   03/11/2018 2143 03/12/2018 1450 Full Code [id number]  Linwood Dibbles, MD ED   02/25/2018 1556 03/09/2018 1839 Full Code [id number]  Rankin, Rada Hay, NP Inpatient   02/23/2018 2051 02/25/2018 1511 Full Code [id number]  Alveria Apley, PA-C ED   08/14/2017 2058 08/17/2017 2012 Full Code [id number]  Georgiana Shore, PA-C ED   08/11/2017 2031 08/12/2017 1453 Full Code [id number]  Shaune Pollack, MD ED   07/29/2017 1628 08/08/2017 0830 Full Code [id number]  Laveda Abbe, NP Inpatient   07/24/2017 1954 07/26/2017 1041 Full Code [id number]   Charm Rings, NP Inpatient   07/24/2017 0716 07/24/2017 1923 Full Code [id number]  Pricilla Loveless, MD ED   07/16/2017 2203 07/18/2017 2151 Full Code [id number]  Eduard Clos, MD ED   01/26/2017 1130 01/29/2017 1758 Full Code [id number]  Deno Lunger, MD ED   12/18/2016 1529 12/21/2016 1625 Full Code [id number]  Bobette Mo, MD ED   04/17/2016 1707 05/08/2016 1235 Full Code [id number]  Lyndal Pulley, MD ED   03/10/2015 2117 03/17/2015 0000 Full Code [id number]  Earley Favor, NP ED   02/19/2015 1545 02/26/2015 1952 Full Code [id number]  [place], Katherine Inpatient   02/19/2015 1541 02/19/2015 1545 Full Code [id number]  [place], Katherine Inpatient   08/21/2014 2241 08/26/2014 1507 Full Code [id number]  Joanna Puff, MD Inpatient   04/06/2014 1638 04/13/2014 1627 Full Code [id number]  Chauncey Mann, MD Inpatient   03/31/2014 1643 04/06/2014 1638 Full Code [id number]  Sherrin Daisy, MD ED   03/22/2014 2252 03/31/2014 1643 Full Code [id number]  Antony Madura, PA-C ED   03/03/2014 0502 03/09/2014 1554 Full Code [id number]  Alfredo Bach, RN Inpatient   02/16/2014 1652 02/23/2014 1903 Full Code [id number]  Chauncey Mann, MD Inpatient   02/14/2014 2105 02/16/2014 1652 Full Code [id number]  Nada Boozer, MD Inpatient   02/02/2014 1621 02/06/2014 1901 Full Code [id number]  Birder Robson, MD Inpatient   06/17/2013 2139 06/21/2013 1900 Full Code [id number]  Arley Phenix, MD ED       Home/SNF/Other Home  Chief Complaint DKA (diabetic ketoacidosis) (HCC) [E11.10]  Level of Care/Admitting Diagnosis ED Disposition     ED Disposition  Admit   Condition  --   Comment  Hospital Area: [facility] [[id number]]  Level of Care: Stepdown [14]  Admit to SDU based on following criteria: Severe physiological/psychological symptoms:  Any diagnosis requiring assessment & intervention at least every 4 hours on an ongoing basis to obtain desired patient outcomes including stability and  rehabilitation  May admit patient to [name] or [facility] if equivalent level of care is available:: Yes  Covid Evaluation: Asymptomatic - no recent exposure (last 10 days) testing not required  Diagnosis: DKA (diabetic ketoacidosis) [facility]) [[id number]]  Admitting Physician: Oretha Milch [[id number]]  Attending Physician: Oretha Milch [[id number]]  Certification:: I certify this patient will need inpatient services for at least 2 midnights  Estimated Length of Stay: 3          Medical History Past Medical History:  Diagnosis Date   ADD (attention deficit disorder)    Allergy    Anxiety    Asthma    Depression    DKA (diabetic ketoacidosis) (HCC) 11/10/2020  Epileptic seizure (HCC)    "both petit and grand mal; last sz ~ 2 wk ago" (06/17/2013)   Fracture of multiple ribs 05/11/2020   HCV (hepatitis C virus)    Headache(784.0)    "2-3 times/wk usually" (06/17/2013)   Heart murmur    "heard a slight one earlier today" (06/17/2013)   Homeless    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   Migraine    "maybe once/month; it's severe" (06/17/2013)   Migraine without status migrainosus, not intractable 12/13/2013   ODD (oppositional defiant disorder)    Partial small bowel obstruction (HCC) 11/11/2021   SBO (small bowel obstruction) (HCC) 11/11/2021   Sickle cell trait (HCC)    Suicidal ideation 10/06/2019   Suicidal ideations 03/31/2014   Type I diabetes mellitus (HCC)    "that's what they're thinking now" (06/17/2013)   Vision abnormalities    "takes him longer to focus cause; from his sz" (06/17/2013)    Allergies Allergies  Allergen Reactions   Bee Venom Anaphylaxis   Fish Allergy Anaphylaxis   Shellfish Allergy Anaphylaxis   Tea Anaphylaxis    IV Location/Drains/Wounds Patient Lines/Drains/Airways Status     Active Line/Drains/Airways     Name Placement date Placement time Site Days   Peripheral IV 11/08/22 20 G Left Antecubital 11/08/22  1359  Antecubital   less than 1   Peripheral IV 11/08/22 20 G Anterior;Distal;Right;Upper Arm 11/08/22  1401  Arm  less than 1   Peripheral IV 11/08/22 20 G Left;Posterior Hand 11/08/22  1619  Hand  less than 1            Labs/Imaging Results for orders placed or performed during the hospital encounter of 11/08/22 (from the past 48 hour(s))  CBG monitoring, ED     Status: Abnormal   Collection Time: 11/08/22  1:53 PM  Result Value Ref Range   Glucose-Capillary >600 (HH) 70 - 99 mg/dL    Comment: Glucose reference range applies only to samples taken after fasting for at least 8 hours.  Beta-hydroxybutyric acid     Status: Abnormal   Collection Time: 11/08/22  2:05 PM  Result Value Ref Range   Beta-Hydroxybutyric Acid >8.00 (H) 0.05 - 0.27 mmol/L    Comment: RESULT CONFIRMED BY MANUAL DILUTION Performed at [facility], [address], [place], [place] [address]   CBC with Differential (PNL)     Status: Abnormal   Collection Time: 11/08/22  2:05 PM  Result Value Ref Range   WBC 20.1 (H) 4.0 - 10.5 K/uL   RBC 5.49 4.22 - 5.81 MIL/uL   Hemoglobin 16.0 13.0 - 17.0 g/dL   HCT 60.4 (H) 54.0 - 98.1 %   MCV 95.6 80.0 - 100.0 fL   MCH 29.1 26.0 - 34.0 pg   MCHC 30.5 30.0 - 36.0 g/dL   RDW 19.1 47.8 - 29.5 %   Platelets 482 (H) 150 - 400 K/uL   nRBC 0.0 0.0 - 0.2 %   Neutrophils Relative % 67 %   Neutro Abs 13.6 (H) 1.7 - 7.7 K/uL   Lymphocytes Relative 25 %   Lymphs Abs 5.1 (H) 0.7 - 4.0 K/uL   Monocytes Relative 3 %   Monocytes Absolute 0.5 0.1 - 1.0 K/uL   Eosinophils Relative 0 %   Eosinophils Absolute 0.0 0.0 - 0.5 K/uL   Basophils Relative 1 %   Basophils Absolute 0.2 (H) 0.0 - 0.1 K/uL   Immature Granulocytes 4 %   Abs Immature Granulocytes 0.76 (H)  0.00 - 0.07 K/uL    Comment: Performed at [facility], [address] [place], [place] [address]  Blood gas, venous     Status: Abnormal   Collection Time: 11/08/22  2:05 PM  Result Value Ref Range    pH, Ven <6.95 (LL) 7.25 - 7.43    Comment: CRITICAL RESULT CALLED TO, READ BACK BY AND VERIFIED WITH: SAVOIE, B. RN AT 1446 ON 11/08/2022 BY MECIAL J.    pCO2, Ven 23 (L) 44 - 60 mmHg   pO2, Ven 56 (H) 32 - 45 mmHg   Bicarbonate 4.6 (L) 20.0 - 28.0 mmol/L   Acid-base deficit 27.3 (H) 0.0 - 2.0 mmol/L   O2 Saturation 77 %   Patient temperature 37.0     Comment: Performed at [facility], [address] [place], [place] [address]  I-stat chem 8, ED (not at [facility], [facility] or [facility])     Status: Abnormal   Collection Time: 11/08/22  2:16 PM  Result Value Ref Range   Sodium 132 (L) 135 - 145 mmol/L   Potassium 5.5 (H) 3.5 - 5.1 mmol/L   Chloride 104 98 - 111 mmol/L   BUN 24 (H) 6 - 20 mg/dL   Creatinine, Ser 3.66 0.61 - 1.24 mg/dL   Glucose, Bld >440 (HH) 70 - 99 mg/dL    Comment: Glucose reference range applies only to samples taken after fasting for at least 8 hours.   Calcium, Ion 1.20 1.15 - 1.40 mmol/L   TCO2 8 (L) 22 - 32 mmol/L   Hemoglobin 18.4 (H) 13.0 - 17.0 g/dL   HCT 34.7 (H) 42.5 - 95.6 %  POC CBG, ED     Status: Abnormal   Collection Time: 11/08/22  3:01 PM  Result Value Ref Range   Glucose-Capillary >600 (HH) 70 - 99 mg/dL    Comment: Glucose reference range applies only to samples taken after fasting for at least 8 hours.  Basic metabolic panel     Status: Abnormal   Collection Time: 11/08/22  3:24 PM  Result Value Ref Range   Sodium 131 (L) 135 - 145 mmol/L   Potassium 5.3 (H) 3.5 - 5.1 mmol/L   Chloride 93 (L) 98 - 111 mmol/L   CO2 <7 (L) 22 - 32 mmol/L   Glucose, Bld 884 (HH) 70 - 99 mg/dL    Comment: CRITICAL RESULT CALLED TO, READ BACK BY AND VERIFIED WITH Nataliya Graig, L. RN AT 1611 ON 11/08/2022 BY MECIAL J. Glucose reference range applies only to samples taken after fasting for at least 8 hours.    BUN 25 (H) 6 - 20 mg/dL   Creatinine, Ser 3.87 (H) 0.61 - 1.24 mg/dL   Calcium 8.1 (L) 8.9 - 10.3 mg/dL   GFR, Estimated >56 >43 mL/min    Comment:  (NOTE) Calculated using the CKD-EPI Creatinine Equation (2021)    Anion gap NOT CALCULATED 5 - 15    Comment: ELECTROLYTES REPEATED TO VERIFY Performed at [facility], [address], [place], [place] [address]   CBG monitoring, ED     Status: Abnormal   Collection Time: 11/08/22  3:34 PM  Result Value Ref Range   Glucose-Capillary >600 (HH) 70 - 99 mg/dL    Comment: Glucose reference range applies only to samples taken after fasting for at least 8 hours.  Urinalysis, Routine w reflex microscopic -Urine, Clean Catch     Status: Abnormal   Collection Time: 11/08/22  3:59 PM  Result Value Ref Range   Color, Urine STRAW (A) YELLOW   APPearance CLEAR CLEAR   Specific Gravity, Urine 1.015 1.005 - 1.030   pH 5.0 5.0 - 8.0   Glucose, UA >=500 (A) NEGATIVE mg/dL   Hgb urine dipstick SMALL (A) NEGATIVE   Bilirubin Urine NEGATIVE NEGATIVE   Ketones, ur 80 (A) NEGATIVE mg/dL   Protein, ur NEGATIVE NEGATIVE mg/dL   Nitrite NEGATIVE NEGATIVE   Leukocytes,Ua NEGATIVE NEGATIVE   RBC / HPF 0-5 0 - 5 RBC/hpf   WBC, UA 0-5 0 - 5 WBC/hpf   Bacteria, UA NONE SEEN NONE SEEN   Squamous Epithelial / HPF 0-5 0 - 5 /HPF   Mucus PRESENT    Hyaline Casts, UA PRESENT     Comment: Performed at [facility], [address] [place], [place] [address]  Rapid urine drug screen (hospital performed)     Status: Abnormal   Collection Time: 11/08/22  3:59 PM  Result Value Ref Range   Opiates POSITIVE (A) NONE DETECTED   Cocaine NONE DETECTED NONE DETECTED   Benzodiazepines NONE DETECTED NONE DETECTED   Amphetamines NONE DETECTED NONE DETECTED   Tetrahydrocannabinol NONE DETECTED NONE DETECTED   Barbiturates NONE DETECTED NONE DETECTED    Comment: (NOTE) DRUG SCREEN FOR MEDICAL PURPOSES ONLY.  IF CONFIRMATION IS NEEDED FOR ANY PURPOSE, NOTIFY LAB WITHIN 5 DAYS.  LOWEST DETECTABLE LIMITS FOR URINE DRUG SCREEN Drug Class                     Cutoff  (ng/mL) Amphetamine and metabolites    1000 Barbiturate and metabolites    200 Benzodiazepine                 200 Opiates and metabolites        300 Cocaine and metabolites        300 THC                            50 Performed at [facility], [address] [place], [place] [address]   CBG monitoring, ED     Status: Abnormal   Collection Time: 11/08/22  4:06 PM  Result Value Ref Range   Glucose-Capillary >600 (HH) 70 - 99 mg/dL    Comment: Glucose reference range applies only to samples taken after fasting for at least 8 hours.  CBG monitoring, ED     Status: Abnormal   Collection Time: 11/08/22  4:38 PM  Result Value Ref Range   Glucose-Capillary >600 (HH) 70 - 99 mg/dL    Comment: Glucose reference range applies only to samples taken after fasting for at least 8 hours.  CBG monitoring, ED     Status: Abnormal   Collection Time: 11/08/22  5:09 PM  Result Value Ref Range   Glucose-Capillary >600 (HH) 70 - 99 mg/dL    Comment: Glucose reference range applies only to samples taken after fasting for at least 8 hours.  CBG monitoring, ED     Status: Abnormal   Collection Time: 11/08/22  5:42 PM  Result Value Ref Range   Glucose-Capillary >600 (HH) 70 - 99 mg/dL    Comment: Glucose reference range applies only to samples taken after fasting for at least 8 hours.  CBG monitoring, ED     Status: Abnormal   Collection Time: 11/08/22  6:10 PM  Result Value Ref Range   Glucose-Capillary 495 (H)  70 - 99 mg/dL    Comment: Glucose reference range applies only to samples taken after fasting for at least 8 hours.   Portable chest x-ray (1 view)  Result Date: 11/08/2022 CLINICAL DATA:  Nausea, vomiting, and bilateral flank pain. EXAM: PORTABLE CHEST 1 VIEW COMPARISON:  C chest hest radiograph 04/13/2022 FINDINGS: Normal cardiomediastinal contours. Lung volumes are low. Lungs are clear. No pneumothorax or large pleural effusion. No acute finding in the visualized skeleton.  IMPRESSION: No acute cardiopulmonary finding. Electronically Signed   By: [name] [name] M.D.   On: 11/08/2022 16:50    Pending Labs Unresulted Labs (From admission, onward)     Start     Ordered   11/08/22 1612  Basic metabolic panel  (Diabetes Ketoacidosis (DKA))  STAT Now then every 4 hours ,   R (with STAT occurrences)      11/08/22 1612   11/08/22 1612  Beta-hydroxybutyric acid  (Diabetes Ketoacidosis (DKA))  Now then every 8 hours,   R (with TIMED occurrences)      11/08/22 1612   11/08/22 1612  Hemoglobin A1c  (Diabetes Ketoacidosis (DKA))  Add-on,   AD       Comments: To assess prior glycemic control.    11/08/22 1612   11/08/22 1610  HIV Antibody (routine testing w rflx)  (HIV Antibody (Routine testing w reflex) panel)  Once,   R        11/08/22 1612   11/08/22 1346  Basic metabolic panel  (Diabetes Ketoacidosis (DKA))  STAT Now then every 4 hours ,   STAT      11/08/22 1345   11/08/22 1346  Beta-hydroxybutyric acid  (Diabetes Ketoacidosis (DKA))  Now then every 8 hours,   STAT (with TIMED, URGENT occurrences)      11/08/22 1345            Vitals/Pain Today's Vitals   11/08/22 1630 11/08/22 1645 11/08/22 1700 11/08/22 1715  BP: 111/75 120/83 101/62 131/89  Pulse: (!) 124 (!) 131 (!) 135 (!) 132  Resp: (!) 30 (!) 36 (!) 29 (!) 40  Temp:      SpO2: 100% 100% 100% 100%    Isolation Precautions No active isolations  Medications Medications  lactated ringers infusion (has no administration in time range)  dextrose 5 % in lactated ringers infusion (has no administration in time range)  lactated ringers infusion (has no administration in time range)  enoxaparin (LOVENOX) injection 30 mg (has no administration in time range)  insulin regular, human (MYXREDLIN) 100 units/ 100 mL infusion (15 Units/hr Intravenous Rate/Dose Change 11/08/22 1812)  lactated ringers infusion (has no administration in time range)  dextrose 5 % in lactated ringers infusion (has no  administration in time range)  dextrose 50 % solution 0-50 mL (has no administration in time range)  promethazine (PHENERGAN) 12.5 mg in sodium chloride 0.9 % 50 mL IVPB (has no administration in time range)  ondansetron (ZOFRAN) injection 4 mg (4 mg Intravenous Given 11/08/22 1404)  morphine (PF) 4 MG/ML injection 4 mg (4 mg Intramuscular Given 11/08/22 1406)  lactated ringers bolus 2,000 mL (0 mLs Intravenous Stopped 11/08/22 1643)  droperidol (INAPSINE) 2.5 MG/ML injection 1.25 mg (1.25 mg Intravenous Given 11/08/22 1438)  sodium bicarbonate injection 50 mEq (50 mEq Intravenous Given 11/08/22 1450)  lactated ringers bolus 2,000 mL (2,000 mLs Intravenous New Bag/Given 11/08/22 1643)  fentaNYL (SUBLIMAZE) injection 50 mcg (50 mcg Intravenous Given 11/08/22 1617)    Mobility walks

## 2022-11-08 NOTE — Progress Notes (Signed)
eLink Physician-Brief Progress Note Patient Name: [name] [name]. DOB: Nov 27, 2000 MRN: [id number]   Date of Service  11/08/2022  HPI/Events of Note  36 M history of Hep C, seizure, methamphetamine abuse, DM1 presented with N/V and bilateral flank pain. Workup consistent with DKA.  Seen resting not agitated Now on dextrose as well as insulin drip AG 25  eICU Interventions  Continued insulin and fluids as well as electrolyte check as per DKA protocol. Discussed with bedside RN     Intervention Category Evaluation Type: New Patient Evaluation  Darl Pikes 11/08/2022, 9:48 PM

## 2022-11-08 NOTE — Progress Notes (Signed)
eLink Physician-Brief Progress Note Patient Name: [name] [name]. DOB: July 31, 2000 MRN: [id number]   Date of Service  11/08/2022  HPI/Events of Note  Patient c/o 10/10 back/ flank pain. In ED received Fentanyl 50 IV and MSO4 4 IM PRN for pain. No analgesics on MAR.  Patient states that back pain is chronic and recurs everytime he has DKA.  UA without signs of infection  eICU Interventions  Ordered morphine 2-4 mg IV q 3 prn If pain persistent may need imaging Discussed with bedside RN     Intervention Category Intermediate Interventions: Pain - evaluation and management  Darl Pikes 11/08/2022, 11:22 PM

## 2022-11-08 NOTE — ED Provider Notes (Signed)
[place] [facility]ERGENCY DEPARTMENT AT [facility] Provider Note  CSN: [id number] Arrival date & time: 11/08/22 1336  Chief Complaint(s) Flank Pain and Emesis  HPI [name] [name]. is a 22 y.o. male with PMH polysubstance use, homelessness, type 1 diabetes, previous hospitalizations for DKA who presents emergency room for evaluation of abdominal pain nausea vomiting.  Patient states that symptoms got significantly worse today.  He states he has not taken his insulin for "weeks".  Patient arrives with rapid deep inspirations, tachycardic and ill-appearing.  Denies chest pain, headache, fever or other systemic symptoms.   Past Medical History Past Medical History:  Diagnosis Date   ADD (attention deficit disorder)    Allergy    Anxiety    Asthma    Depression    DKA (diabetic ketoacidosis) (HCC) 11/10/2020   Epileptic seizure (HCC)    "both petit and grand mal; last sz ~ 2 wk ago" (06/17/2013)   Fracture of multiple ribs 05/11/2020   HCV (hepatitis C virus)    Headache(784.0)    "2-3 times/wk usually" (06/17/2013)   Heart murmur    "heard a slight one earlier today" (06/17/2013)   Homeless    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   Migraine    "maybe once/month; it's severe" (06/17/2013)   Migraine without status migrainosus, not intractable 12/13/2013   ODD (oppositional defiant disorder)    Partial small bowel obstruction (HCC) 11/11/2021   SBO (small bowel obstruction) (HCC) 11/11/2021   Sickle cell trait (HCC)    Suicidal ideation 10/06/2019   Suicidal ideations 03/31/2014   Type I diabetes mellitus (HCC)    "that's what they're thinking now" (06/17/2013)   Vision abnormalities    "takes him longer to focus cause; from his sz" (06/17/2013)   Patient Active Problem List   Diagnosis Date Noted   DKA, type 1 (HCC) 07/06/2022   Homeless 07/02/2022   ADD (attention deficit disorder)    Hep C w/o coma, chronic (HCC) 07/19/2021   Urinary retention  07/19/2021   Crohn's disease of small intestine without complication (HCC) 06/17/2021   Hypertriglyceridemia 12/27/2020   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   Tobacco abuse 12/11/2020   IVDU (intravenous drug user) 09/14/2020   Schizoaffective disorder (HCC) 05/18/2020    Class: Acute   Polysubstance abuse (HCC)    Amphetamine-type substance use disorder, 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   Bipolar disorder (HCC) 10/06/2019   Mild intermittent asthma without complication 08/29/2019   Anxiety 07/10/2019   Adjustment disorder with mixed disturbance of emotions and conduct    Borderline personality disorder (HCC) 02/26/2018   MDD (major depressive disorder) 07/24/2017   Uncontrolled type 1 diabetes circulatory disorder erectile dysfunction    History of migraine 12/01/2016   Disordered eating 03/08/2015   Non compliance w medication regimen    Diabetic peripheral neuropathy associated with type 1 diabetes mellitus (HCC) 09/29/2014   Type 1 diabetes mellitus with hyperglycemia (HCC)    Noncompliance    Abdominal pain    Severe bipolar I disorder, most recent episode depressed (HCC) 07/12/2014   Involuntary movements 06/13/2014   Bilateral leg pain 06/13/2014   Somatic symptom disorder, persistent, moderate 04/07/2014   Sleepwalking disorder 04/07/2014   Oppositional defiant disorder 03/03/2014   Attention deficit hyperactivity disorder (ADHD), combined type, moderate 02/16/2014   Microcytic anemia 02/15/2014   Hyperglycemia 02/14/2014   Goiter 02/04/2014   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) 02/04/2014   Acquired acanthosis nigricans 02/04/2014   Hyperinsulinemia 02/04/2014  Maladaptive health behaviors affecting medical condition 02/02/2014   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 Authorizing Provider  Accu-Chek Softclix Lancets lancets Use as instructed 07/08/22   Rhetta Mura, MD  blood glucose meter kit and supplies Use up to four times daily as directed. 07/08/22   Rhetta Mura, MD  Cyanocobalamin (VITAMIN B12 PO) Take 1 tablet by mouth in the morning and at bedtime. Patient not taking: Reported on 07/07/2022    [provider]  glucose blood (ACCU-CHEK GUIDE) test strip Use as instructed 07/08/22   Rhetta Mura, MD  insulin aspart (NOVOLOG FLEXPEN) 100 UNIT/ML FlexPen Inject 0 - 20 Units into the skin 3 times daily with meals. 07/08/22   Rhetta Mura, MD  insulin glargine (LANTUS) 100 UNIT/ML Solostar Pen Inject 30 Units into the skin in the morning and at bedtime. 07/08/22   Rhetta Mura, MD  lisinopril (ZESTRIL) 10 MG tablet Take 1 tablet (10 mg total) by mouth daily. 07/08/22   Rhetta Mura, MD  QUEtiapine (SEROQUEL) 50 MG tablet Take 1 and 1/2 tablets (75 mg total) by mouth at bedtime. 07/08/22   Rhetta Mura, MD  sertraline (ZOLOFT) 100 MG tablet Take 1 tablet (100 mg total) by mouth daily. 07/08/22   Rhetta Mura, MD                                                                                                                                    Past Surgical History Past Surgical History:  Procedure Laterality Date   CIRCUMCISION  2000   FINGER SURGERY Left 2001   "crushed pinky; had to repair it" (06/17/2013)   Family History Family History  Problem Relation Age of Onset   Asthma Mother    COPD Mother    Other Mother        possible autoimmune,  unclear   Migraines Sister        Hemiplegic Migraines    Diabetes Maternal Grandmother    Heart disease Maternal Grandmother    Hypertension Maternal Grandmother    Mental illness Maternal Grandmother    Heart disease Maternal Grandfather    Hyperlipidemia Paternal Grandmother    Hyperlipidemia Paternal Grandfather     Social History Social History   Tobacco Use   Smoking status: Every Day    Packs/day: .5    Types: Cigarettes    Last attempt to quit: 06/2020    Years since quitting: 2.3   Smokeless  tobacco: Never   Tobacco comments:    Mom and dad smoke outside   Vaping Use   Vaping Use: Former  Substance Use Topics   Alcohol use: No    Alcohol/week: 0.0 standard drinks of alcohol   Drug use: Yes    Types: Methamphetamines    Comment: 10- 14 times a week   Allergies Bee venom, Fish allergy, Shellfish allergy, and Tea  Review of Systems Review of Systems  Gastrointestinal:  Positive for abdominal pain, nausea and vomiting.    Physical Exam Vital Signs  I have reviewed the triage vital signs BP (!) 166/105   Pulse (!) 124   Resp (!) 42   SpO2 100%   Physical Exam Constitutional:      General: He is in acute distress.     Appearance: Normal appearance. He is ill-appearing and diaphoretic.  HENT:     Head: Normocephalic and atraumatic.     Nose: No congestion or rhinorrhea.  Eyes:     General:        Right eye: No discharge.        Left eye: No discharge.     Extraocular Movements: Extraocular movements intact.     Pupils: Pupils are equal, round, and reactive to light.  Cardiovascular:     Rate and Rhythm: Regular rhythm. Tachycardia present.     Heart sounds: No murmur heard. Pulmonary:     Effort: No respiratory distress.     Breath sounds: No wheezing or rales.  Abdominal:     General: There is no distension.     Tenderness: There is abdominal tenderness.  Musculoskeletal:        General: Normal range of motion.     Cervical back: Normal  range of motion.  Skin:    General: Skin is warm.  Neurological:     General: No focal deficit present.     Mental Status: He is alert.     ED Results and Treatments Labs (all labs ordered are listed, but only abnormal results are displayed) Labs Reviewed  BASIC METABOLIC PANEL  BASIC METABOLIC PANEL  BASIC METABOLIC PANEL  BASIC METABOLIC PANEL  BETA-HYDROXYBUTYRIC ACID  BETA-HYDROXYBUTYRIC ACID  CBC WITH DIFFERENTIAL/PLATELET  URINALYSIS, ROUTINE W REFLEX MICROSCOPIC  BLOOD GAS, VENOUS  CBG MONITORING, ED  CBG MONITORING, ED  I-STAT CHEM 8, ED                                                                                                                          Radiology No results found.  Pertinent labs & imaging results that were available during my care of the patient were reviewed by me and considered in my medical decision making (see MDM for details).  Medications Ordered in ED Medications  lactated ringers bolus 20 mL/kg (has no administration in time range)  ondansetron (ZOFRAN) injection 4 mg (has no administration in time range)  morphine (PF) 4 MG/ML injection 4 mg (has no administration  in time range)                                                                                                                                     Procedures .Critical Care  Performed by: Glendora Score, MD Authorized by: Glendora Score, MD   Critical care provider statement:    Critical care time (minutes):  80   Critical care was necessary to treat or prevent imminent or life-threatening deterioration of the following conditions:  Metabolic crisis   Critical care was time spent personally by me on the following activities:  Development of treatment plan with patient or surrogate, discussions with consultants, evaluation of patient's response to treatment, examination of patient, ordering and review of laboratory studies, ordering and review of radiographic studies,  ordering and performing treatments and interventions, pulse oximetry, re-evaluation of patient's condition and review of old charts Ultrasound ED Peripheral IV (Provider)  Date/Time: 11/08/2022 2:03 PM  Performed by: Glendora Score, MD Authorized by: Glendora Score, MD   Procedure details:    Indications: poor IV access     Skin Prep: isopropyl alcohol     Location:  Left AC   Angiocath:  20 G   Bedside Ultrasound Guided: Yes     Images: not archived     Patient tolerated procedure without complications: Yes     Dressing applied: Yes     (including critical care time)  Medical Decision Making / ED Course   This patient presents to the ED for concern of abdominal pain, vomiting, hyperglycemia, this involves an extensive number of treatment options, and is a complaint that carries with it a high risk of complications and morbidity.  The differential diagnosis includes DKA, HHS, stress hyperglycemia, polysubstance use, dehydration, sepsis  MDM: Patient seen emergency room for evaluation of abdominal pain nausea vomiting and severe hyperglycemia.  Physical exam reveals an ill-appearing agitated patient with tachycardia and dry tacky mucous membranes.  Initial glucometer reading is undetectably high.  Aggressive fluid resuscitation begun and I personally placed a ultrasound-guided IV.  Initial i-STAT with a potassium of 5.5, glucose greater than 700 and a bicarb of 8.  Insulin drip started.  Leukocytosis to 20.1 likely stress demargination in the setting of DKA.  Initial pH is less than 6.95 and patient received 1 amp of bicarb.  Patient severely agitated here in the emergency room and and required pain control and droperidol for persistent nausea and vomiting.  Will attempt to avoid overuse of substances that will decrease the patient's respiratory drive but initially have overall low suspicion for cerebral edema.  Spoke with Dr. Katrinka Blazing of critical care who will help facilitate ICU admission.   Patient admitted to the ICU   Additional history obtained:  -External records from outside source obtained and reviewed including: Chart review including previous notes, labs, imaging, consultation notes   Lab Tests: -I ordered, reviewed, and interpreted labs.   The  pertinent results include:   Labs Reviewed  BASIC METABOLIC PANEL  BASIC METABOLIC PANEL  BASIC METABOLIC PANEL  BASIC METABOLIC PANEL  BETA-HYDROXYBUTYRIC ACID  BETA-HYDROXYBUTYRIC ACID  CBC WITH DIFFERENTIAL/PLATELET  URINALYSIS, ROUTINE W REFLEX MICROSCOPIC  BLOOD GAS, VENOUS  CBG MONITORING, ED  CBG MONITORING, ED  I-STAT CHEM 8, ED      EKG   EKG Interpretation  Date/Time:  Saturday Nov 08 2022 13:51:00 EDT Ventricular Rate:  123 PR Interval:  145 QRS Duration: 81 QT Interval:  327 QTC Calculation: 468 R Axis:   80 Text Interpretation: Sinus tachycardia Ventricular premature complex Confirmed by Makana Feigel ([id number]) on 11/08/2022 2:15:43 PM         Medicines ordered and prescription drug management: Meds ordered this encounter  Medications   lactated ringers bolus 20 mL/kg   ondansetron (ZOFRAN) injection 4 mg   morphine (PF) 4 MG/ML injection 4 mg    -I have reviewed the patients home medicines and have made adjustments as needed  Critical interventions Insulin, fluids, bicarb  Consultations Obtained: I requested consultation with the intensivist Dr. Katrinka Blazing,  and discussed lab and imaging findings as well as pertinent plan - they recommend: ICU admission   Cardiac Monitoring: The patient was maintained on a cardiac monitor.  I personally viewed and interpreted the cardiac monitored which showed an underlying rhythm of: Sinus tachycardia,   Social Determinants of Health:  Factors impacting patients care include: homeless, history of substance use, medication noncompliance   Reevaluation: After the interventions noted above, I reevaluated the patient and found that they have  :improved  Co morbidities that complicate the patient evaluation  Past Medical History:  Diagnosis Date   ADD (attention deficit disorder)    Allergy    Anxiety    Asthma    Depression    DKA (diabetic ketoacidosis) (HCC) 11/10/2020   Epileptic seizure (HCC)    "both petit and grand mal; last sz ~ 2 wk ago" (06/17/2013)   Fracture of multiple ribs 05/11/2020   HCV (hepatitis C virus)    Headache(784.0)    "2-3 times/wk usually" (06/17/2013)   Heart murmur    "heard a slight one earlier today" (06/17/2013)   Homeless    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   Migraine    "maybe once/month; it's severe" (06/17/2013)   Migraine without status migrainosus, not intractable 12/13/2013   ODD (oppositional defiant disorder)    Partial small bowel obstruction (HCC) 11/11/2021   SBO (small bowel obstruction) (HCC) 11/11/2021   Sickle cell trait (HCC)    Suicidal ideation 10/06/2019   Suicidal ideations 03/31/2014   Type I diabetes mellitus (HCC)    "that's what they're thinking now" (06/17/2013)   Vision abnormalities    "takes him longer to focus cause; from his sz" (06/17/2013)      Dispostion: I considered admission for this patient, and due to severe DKA patient require hospital admission     Final Clinical Impression(s) / ED Diagnoses Final diagnoses:  None     @PCDICTATION @    Glendora Score, MD 11/08/22 1503

## 2022-11-08 NOTE — ED Notes (Signed)
Per endotool, insulin drip to continue at 30units/hr

## 2022-11-09 ENCOUNTER — Inpatient Hospital Stay (HOSPITAL_COMMUNITY): Payer: Medicaid Other

## 2022-11-09 LAB — BASIC METABOLIC PANEL
Anion gap: 11 (ref 5–15)
Anion gap: 12 (ref 5–15)
Anion gap: 13 (ref 5–15)
Anion gap: 15 (ref 5–15)
BUN: 10 mg/dL (ref 6–20)
BUN: 12 mg/dL (ref 6–20)
BUN: 13 mg/dL (ref 6–20)
BUN: 9 mg/dL (ref 6–20)
CO2: 16 mmol/L — ABNORMAL LOW (ref 22–32)
CO2: 17 mmol/L — ABNORMAL LOW (ref 22–32)
CO2: 19 mmol/L — ABNORMAL LOW (ref 22–32)
CO2: 19 mmol/L — ABNORMAL LOW (ref 22–32)
Calcium: 7.6 mg/dL — ABNORMAL LOW (ref 8.9–10.3)
Calcium: 7.8 mg/dL — ABNORMAL LOW (ref 8.9–10.3)
Calcium: 7.8 mg/dL — ABNORMAL LOW (ref 8.9–10.3)
Calcium: 8.2 mg/dL — ABNORMAL LOW (ref 8.9–10.3)
Chloride: 104 mmol/L (ref 98–111)
Chloride: 105 mmol/L (ref 98–111)
Chloride: 106 mmol/L (ref 98–111)
Chloride: 107 mmol/L (ref 98–111)
Creatinine, Ser: 0.55 mg/dL — ABNORMAL LOW (ref 0.61–1.24)
Creatinine, Ser: 0.73 mg/dL (ref 0.61–1.24)
Creatinine, Ser: 0.79 mg/dL (ref 0.61–1.24)
Creatinine, Ser: 0.79 mg/dL (ref 0.61–1.24)
GFR, Estimated: >60 mL/min (ref >60)
GFR, Estimated: >60 mL/min (ref >60)
GFR, Estimated: >60 mL/min (ref >60)
GFR, Estimated: >60 mL/min (ref >60)
Glucose, Bld: 121 mg/dL — ABNORMAL HIGH (ref 70–99)
Glucose, Bld: 139 mg/dL — ABNORMAL HIGH (ref 70–99)
Glucose, Bld: 180 mg/dL — ABNORMAL HIGH (ref 70–99)
Glucose, Bld: 257 mg/dL — ABNORMAL HIGH (ref 70–99)
Potassium: 3.6 mmol/L (ref 3.5–5.1)
Potassium: 3.7 mmol/L (ref 3.5–5.1)
Potassium: 3.8 mmol/L (ref 3.5–5.1)
Potassium: 4.1 mmol/L (ref 3.5–5.1)
Sodium: 135 mmol/L (ref 135–145)
Sodium: 135 mmol/L (ref 135–145)
Sodium: 137 mmol/L (ref 135–145)
Sodium: 137 mmol/L (ref 135–145)

## 2022-11-09 LAB — GLUCOSE, CAPILLARY
Glucose-Capillary: 126 mg/dL — ABNORMAL HIGH (ref 70–99)
Glucose-Capillary: 141 mg/dL — ABNORMAL HIGH (ref 70–99)
Glucose-Capillary: 152 mg/dL — ABNORMAL HIGH (ref 70–99)
Glucose-Capillary: 182 mg/dL — ABNORMAL HIGH (ref 70–99)
Glucose-Capillary: 123 mg/dL — ABNORMAL HIGH (ref 70–99)
Glucose-Capillary: 128 mg/dL — ABNORMAL HIGH (ref 70–99)
Glucose-Capillary: 132 mg/dL — ABNORMAL HIGH (ref 70–99)
Glucose-Capillary: 134 mg/dL — ABNORMAL HIGH (ref 70–99)
Glucose-Capillary: 148 mg/dL — ABNORMAL HIGH (ref 70–99)
Glucose-Capillary: 155 mg/dL — ABNORMAL HIGH (ref 70–99)
Glucose-Capillary: 156 mg/dL — ABNORMAL HIGH (ref 70–99)
Glucose-Capillary: 177 mg/dL — ABNORMAL HIGH (ref 70–99)
Glucose-Capillary: 120 mg/dL — ABNORMAL HIGH (ref 70–99)
Glucose-Capillary: 210 mg/dL — ABNORMAL HIGH (ref 70–99)
Glucose-Capillary: 273 mg/dL — ABNORMAL HIGH (ref 70–99)
Glucose-Capillary: 313 mg/dL — ABNORMAL HIGH (ref 70–99)
Glucose-Capillary: 198 mg/dL — ABNORMAL HIGH (ref 70–99)
Glucose-Capillary: 234 mg/dL — ABNORMAL HIGH (ref 70–99)
Glucose-Capillary: 202 mg/dL — ABNORMAL HIGH (ref 70–99)
Glucose-Capillary: 175 mg/dL — ABNORMAL HIGH (ref 70–99)
Glucose-Capillary: 219 mg/dL — ABNORMAL HIGH (ref 70–99)

## 2022-11-09 LAB — CBC WITH DIFFERENTIAL/PLATELET
Abs Immature Granulocytes: 0.13 10*3/uL — ABNORMAL HIGH (ref 0.00–0.07)
Basophils Absolute: 0 10*3/uL (ref 0.0–0.1)
Basophils Relative: 0 %
Eosinophils Absolute: 0.1 10*3/uL (ref 0.0–0.5)
Eosinophils Relative: 1 %
HCT: 32.9 % — ABNORMAL LOW (ref 39.0–52.0)
Hemoglobin: 11 g/dL — ABNORMAL LOW (ref 13.0–17.0)
Immature Granulocytes: 2 %
Lymphocytes Relative: 25 %
Lymphs Abs: 1.9 10*3/uL (ref 0.7–4.0)
MCH: 29.3 pg (ref 26.0–34.0)
MCHC: 33.4 g/dL (ref 30.0–36.0)
MCV: 87.5 fL (ref 80.0–100.0)
Monocytes Absolute: 0.6 10*3/uL (ref 0.1–1.0)
Monocytes Relative: 7 %
Neutro Abs: 5.1 10*3/uL (ref 1.7–7.7)
Neutrophils Relative %: 65 %
Platelets: 225 10*3/uL (ref 150–400)
RBC: 3.76 MIL/uL — ABNORMAL LOW (ref 4.22–5.81)
RDW: 12.7 % (ref 11.5–15.5)
WBC: 7.8 10*3/uL (ref 4.0–10.5)
nRBC: 0 % (ref 0.0–0.2)

## 2022-11-09 LAB — BETA-HYDROXYBUTYRIC ACID
Beta-Hydroxybutyric Acid: 0.49 mmol/L — ABNORMAL HIGH (ref 0.05–0.27)
Beta-Hydroxybutyric Acid: 3.14 mmol/L — ABNORMAL HIGH (ref 0.05–0.27)
Beta-Hydroxybutyric Acid: 3.25 mmol/L — ABNORMAL HIGH (ref 0.05–0.27)

## 2022-11-09 MED ORDER — OXYCODONE-ACETAMINOPHEN 5-325 MG PO TABS
1.0000 | ORAL_TABLET | Freq: Four times a day (QID) | ORAL | Status: DC | PRN
  Administered 2022-11-09 – 2022-11-10 (×3): 1 via ORAL
  Filled 2022-11-09 (×3): qty 1

## 2022-11-09 MED ORDER — INSULIN ASPART 100 UNIT/ML IJ SOLN
2.0000 [IU] | INTRAMUSCULAR | Status: DC
  Administered 2022-11-09 – 2022-11-10 (×2): 6 [IU] via SUBCUTANEOUS
  Administered 2022-11-10: 2 [IU] via SUBCUTANEOUS
  Administered 2022-11-10: 4 [IU] via SUBCUTANEOUS

## 2022-11-09 MED ORDER — ENOXAPARIN SODIUM 40 MG/0.4ML IJ SOSY
40.0000 mg | PREFILLED_SYRINGE | Freq: Every day | INTRAMUSCULAR | Status: DC
  Administered 2022-11-09: 40 mg via SUBCUTANEOUS
  Filled 2022-11-09: qty 0.4

## 2022-11-09 MED ORDER — SODIUM CHLORIDE 0.9 % IV SOLN
3.0000 g | Freq: Four times a day (QID) | INTRAVENOUS | Status: DC
  Administered 2022-11-09 – 2022-11-10 (×4): 3 g via INTRAVENOUS
  Filled 2022-11-09 (×4): qty 8

## 2022-11-09 MED ORDER — INSULIN DETEMIR 100 UNIT/ML ~~LOC~~ SOLN
15.0000 [IU] | Freq: Two times a day (BID) | SUBCUTANEOUS | Status: DC
  Administered 2022-11-09 – 2022-11-10 (×2): 15 [IU] via SUBCUTANEOUS
  Filled 2022-11-09 (×3): qty 0.15

## 2022-11-09 MED ORDER — SERTRALINE HCL 100 MG PO TABS
100.0000 mg | ORAL_TABLET | Freq: Every day | ORAL | Status: DC
  Administered 2022-11-09 – 2022-11-10 (×2): 100 mg via ORAL
  Filled 2022-11-09 (×2): qty 1

## 2022-11-09 MED ORDER — MORPHINE SULFATE (PF) 2 MG/ML IV SOLN
2.0000 mg | INTRAVENOUS | Status: AC | PRN
  Administered 2022-11-09 (×2): 2 mg via INTRAVENOUS
  Filled 2022-11-09 (×2): qty 1

## 2022-11-09 MED ORDER — IOHEXOL 9 MG/ML PO SOLN
ORAL | Status: AC
  Filled 2022-11-09: qty 1000

## 2022-11-09 MED ORDER — IOHEXOL 9 MG/ML PO SOLN: 500.0000 mL | ORAL | Status: AC

## 2022-11-09 MED ORDER — QUETIAPINE FUMARATE 50 MG PO TABS
75.0000 mg | ORAL_TABLET | Freq: Every day | ORAL | Status: DC
  Administered 2022-11-09: 75 mg via ORAL
  Filled 2022-11-09: qty 2

## 2022-11-09 MED ORDER — INSULIN DETEMIR 100 UNIT/ML ~~LOC~~ SOLN
15.0000 [IU] | Freq: Two times a day (BID) | SUBCUTANEOUS | Status: DC
  Filled 2022-11-09: qty 0.15

## 2022-11-09 MED ORDER — POTASSIUM CHLORIDE 10 MEQ/100ML IV SOLN
10.0000 meq | INTRAVENOUS | Status: AC
  Administered 2022-11-09 (×2): 10 meq via INTRAVENOUS
  Filled 2022-11-09 (×2): qty 100

## 2022-11-09 NOTE — Progress Notes (Addendum)
Patient refused labs at this time, educated on the importance of lab values. Patient allowed lab to attempt, lab unable to obtain blood on first stick. Patient refused a second stick at this time.

## 2022-11-09 NOTE — Progress Notes (Signed)
Pharmacy Antibiotic Note  [name] [name]. is a 22 y.o. male admitted on 11/08/2022 with DKA, intra-abdominal infection.  Pharmacy has been consulted for Unasyn dosing.  Plan: Unasyn 3g IV q6h Pharmacy will sign off notes, but will continue to follow up peripherally for renal dosing, cultures, etc.   Height: 5' 10.5" (179.1 cm) Weight: 71.5 kg (157 lb 10.1 oz) IBW/kg (Calculated) : 74.15  Temp (24hrs), Avg:98.2 F (36.8 C), Min:97.6 F (36.4 C), Max:100.1 F (37.8 C)  Recent Labs  Lab 11/08/22 1405 11/08/22 1416 11/08/22 1756 11/08/22 2035 11/08/22 2349 11/09/22 0305 11/09/22 0821  WBC 20.1*  --   --   --   --   --   --   CREATININE  --    < > 1.50* 1.21 0.79 0.73 0.55*   < > = values in this interval not displayed.    Estimated Creatinine Clearance: 145.2 mL/min (A) (by C-G formula based on SCr of 0.55 mg/dL (L)).    Allergies  Allergen Reactions   Bee Venom Anaphylaxis   Fish Allergy Anaphylaxis   Shellfish Allergy Anaphylaxis   Tea Anaphylaxis    Antimicrobials this admission: 5/12 Unasyn >>   Dose adjustments this admission:   Microbiology results: 5/11 MRSA PCR: not detected  Thank you for allowing pharmacy to be a part of this patient's care.  Lynann Beaver PharmD, BCPS [facility] main pharmacy [phone] 11/09/2022 10:47 AM

## 2022-11-09 NOTE — Progress Notes (Signed)
NAME:  [name] Easter., MRN:  [id number], DOB:  [birth year], LOS: 1 ADMISSION DATE:  11/08/2022, CONSULTATION DATE:  11/09/2022  REFERRING MD:  Kommor, EDP, CHIEF COMPLAINT: Abdominal pain and nausea  History of Present Illness:  22 year old man with type 1 diabetes, brought in by EMS , with complaints of severe nausea, vomiting and bilateral flank pain for 3 days. His CBG was 432 by EMS and in the ED was more than 700 .he had rapid deep respirations and tachycardia with dry mucous membranes.  He reported that his last insulin use was few days ago. Initial labs showed potassium 5.5, glucose more than 700 bicarbonate of 8 with venous pH of 6.95.  He was given 1 amp of bicarb started on insulin drip, given 2 L of fluids.  He was given droperidol for severe nausea.  He was intermittently agitated and trying to get off the ED stretcher, hence PCCM consulted.  He has a history of  prior recurrent admits for DKA  Pertinent  Medical History  Homelessness Type 1 diabetes Seizure disorder Bipolar Hep C Crohn's disease Questionable diagnosis of Tourette's Substance abuse methamphetamine  Significant Hospital Events: Including procedures, antibiotic start and stop dates in addition to other pertinent events     Interim History / Subjective:   Nausea significantly decreased. Complains of pain in abdomen  Objective   Blood pressure (!) 124/56, pulse 89, temperature 97.9 F (36.6 C), temperature source Axillary, resp. rate 15, height 5' 10.5" (1.791 m), weight 71.5 kg, SpO2 97 %.        Intake/Output Summary (Last 24 hours) at 11/09/2022 1018 Last data filed at 11/09/2022 1015 Gross per 24 hour  Intake 1877.91 ml  Output 2200 ml  Net -322.09 ml   Filed Weights   11/08/22 2000  Weight: 71.5 kg    Examination: General: Young man, several tattoos, moderate distress, complains of nausea, otherwise nonverbal HENT: Pupils 3 mm reactive to light, no JVD, dry mucosa Lungs: Clear  bilateral, tachypnea, mild accessory muscle use Cardiovascular: S1-S2 tacky no murmurs Abdomen: Soft, tender right lower quadrant, no guarding Extremities: No edema, no deformity, several tattoos Neuro: Alert, interactive, nonfocal  Labs/show mild hypokalemia, beta hydroxybutyrate decreased to 3.5, anion gap dropped 11   Resolved Hospital Problem list     Assessment & Plan:  Severe DKA -admits to missing insulin for a week Leukocytosis -likely reactive although will rule out infection with UA and chest x-ray  -On insulin drip and dextrose drip. -Await beta-hydroxybutyrate analysis close to normal, will DC insulin drip and transition to subcu insulin. He really wants to eat so we will allow carb modified diet  Abdominal pain Severe nausea -likely related to DKA Phenergan as needed -Will obtain CT abdomen/pelvis to rule out appendicitis  Psychomotor agitation -likely related to nausea and DKA Possible substance withdrawal -UDS positive for opiates -Using Phenergan currently. Pain seeking behavior -will use oral oxycodone for now Resume Zoloft and Seroquel which are home meds.  He can transition to progressive care and move out of the ICU once off insulin drip  Best Practice (right click and "Reselect all SmartList Selections" daily)   Diet/type: Regular consistency (see orders) DVT prophylaxis: LMWH GI prophylaxis: PPI Lines: N/A Foley:  N/A Code Status:  full code Last date of multidisciplinary goals of care discussion [NA]  Labs   CBC: Recent Labs  Lab 11/08/22 1405 11/08/22 1416  WBC 20.1*  --   NEUTROABS 13.6*  --   HGB 16.0 18.4*  HCT 52.5* 54.0*  MCV 95.6  --   PLT 482*  --      Basic Metabolic Panel: Recent Labs  Lab 11/08/22 1524 11/08/22 1756 11/08/22 2035 11/08/22 2349 11/09/22 0305  NA 131* 135 139 137 137  K 5.3* 4.4 4.2 3.8 3.6  CL 93* 100 105 105 107  CO2 <7* <7* 9* 17* 19*  GLUCOSE 884* 561* 236* 139* 121*  BUN 25* 24* 20 13 12    CREATININE 1.58* 1.50* 1.21 0.79 0.73  CALCIUM 8.1* 8.2* 8.6* 8.2* 7.8*    GFR: Estimated Creatinine Clearance: 145.2 mL/min (by C-G formula based on SCr of 0.73 mg/dL). Recent Labs  Lab 11/08/22 1405  WBC 20.1*     Liver Function Tests: No results for input(s): "AST", "ALT", "ALKPHOS", "BILITOT", "PROT", "ALBUMIN" in the last 168 hours. No results for input(s): "LIPASE", "AMYLASE" in the last 168 hours. No results for input(s): "AMMONIA" in the last 168 hours.  ABG    Component Value Date/Time   PHART 7.43 12/05/2021 2343   PCO2ART 38 12/05/2021 2343   PO2ART 65 (L) 12/05/2021 2343   HCO3 4.6 (L) 11/08/2022 1405   TCO2 8 (L) 11/08/2022 1416   ACIDBASEDEF 27.3 (H) 11/08/2022 1405   O2SAT 77 11/08/2022 1405     Coagulation Profile: No results for input(s): "INR", "PROTIME" in the last 168 hours.  Cardiac Enzymes: No results for input(s): "CKTOTAL", "CKMB", "CKMBINDEX", "TROPONINI" in the last 168 hours.  HbA1C: Hgb A1c MFr Bld  Date/Time Value Ref Range Status  11/08/2022 02:07 PM 9.5 (H) 4.8 - 5.6 % Final    Comment:    (NOTE) Pre diabetes:          5.7%-6.4%  Diabetes:              >6.4%  Glycemic control for   <7.0% adults with diabetes   04/14/2022 03:55 AM 9.3 (H) 4.8 - 5.6 % Final    Comment:    (NOTE) Pre diabetes:          5.7%-6.4%  Diabetes:              >6.4%  Glycemic control for   <7.0% adults with diabetes     CBG: Recent Labs  Lab 11/09/22 0551 11/09/22 0647 11/09/22 0757 11/09/22 0857 11/09/22 0957  GLUCAP 132* 128* 156* 177* 120*    Critical care time: 31 m     Cyril Mourning MD. FCCP. [name] Pager : [phone]  If no response to pager , please call [phone] until 7 pm After 7:00 pm call Elink  [phone]   11/09/2022

## 2022-11-09 NOTE — Progress Notes (Signed)
eLink Physician-Brief Progress Note Patient Name: [name] [name]. DOB: May 01, 2001 MRN: [id number]   Date of Service  11/09/2022  HPI/Events of Note  Endotool advising to tranition off drip, currently 1.2 units. Last 3 CBG 152, 141, 126. Last beta-hydrox 3.25. CO2 17, Cl 105 Tolerated ice chips  eICU Interventions  Will continue insulin drip for now as BHB still elevated and still acidotic Discussed with bedside RN     Intervention Category Intermediate Interventions: Other:  Darl Pikes 11/09/2022, 1:31 AM

## 2022-11-09 NOTE — Progress Notes (Signed)
Patient refused sugar check at 1400. Encouraged lab to attempt to obtain blood sample again. Patient agreed to lab draw. Obtained CBG with lab draw.

## 2022-11-10 ENCOUNTER — Other Ambulatory Visit (HOSPITAL_COMMUNITY): Payer: Self-pay

## 2022-11-10 DIAGNOSIS — E1010 Type 1 diabetes mellitus with ketoacidosis without coma: Secondary | ICD-10-CM

## 2022-11-10 LAB — CBC WITH DIFFERENTIAL/PLATELET
Abs Immature Granulocytes: 0.07 10*3/uL (ref 0.00–0.07)
Basophils Absolute: 0 10*3/uL (ref 0.0–0.1)
Basophils Relative: 1 %
Eosinophils Absolute: 0.1 10*3/uL (ref 0.0–0.5)
Eosinophils Relative: 1 %
HCT: 34.7 % — ABNORMAL LOW (ref 39.0–52.0)
Hemoglobin: 11.4 g/dL — ABNORMAL LOW (ref 13.0–17.0)
Immature Granulocytes: 1 %
Lymphocytes Relative: 38 %
Lymphs Abs: 1.9 10*3/uL (ref 0.7–4.0)
MCH: 29.2 pg (ref 26.0–34.0)
MCHC: 32.9 g/dL (ref 30.0–36.0)
MCV: 89 fL (ref 80.0–100.0)
Monocytes Absolute: 0.4 10*3/uL (ref 0.1–1.0)
Monocytes Relative: 8 %
Neutro Abs: 2.6 10*3/uL (ref 1.7–7.7)
Neutrophils Relative %: 51 %
Platelets: 201 10*3/uL (ref 150–400)
RBC: 3.9 MIL/uL — ABNORMAL LOW (ref 4.22–5.81)
RDW: 12.8 % (ref 11.5–15.5)
WBC: 5.1 10*3/uL (ref 4.0–10.5)
nRBC: 0 % (ref 0.0–0.2)

## 2022-11-10 LAB — GLUCOSE, CAPILLARY
Glucose-Capillary: 131 mg/dL — ABNORMAL HIGH (ref 70–99)
Glucose-Capillary: 186 mg/dL — ABNORMAL HIGH (ref 70–99)
Glucose-Capillary: 309 mg/dL — ABNORMAL HIGH (ref 70–99)
Glucose-Capillary: 71 mg/dL (ref 70–99)

## 2022-11-10 LAB — BASIC METABOLIC PANEL
Anion gap: 7 (ref 5–15)
BUN: 9 mg/dL (ref 6–20)
CO2: 26 mmol/L (ref 22–32)
Calcium: 7.8 mg/dL — ABNORMAL LOW (ref 8.9–10.3)
Chloride: 106 mmol/L (ref 98–111)
Creatinine, Ser: 0.45 mg/dL — ABNORMAL LOW (ref 0.61–1.24)
GFR, Estimated: >60 mL/min (ref >60)
Glucose, Bld: 90 mg/dL (ref 70–99)
Potassium: 3.2 mmol/L — ABNORMAL LOW (ref 3.5–5.1)
Sodium: 139 mmol/L (ref 135–145)

## 2022-11-10 LAB — MAGNESIUM: Magnesium: 1.9 mg/dL (ref 1.7–2.4)

## 2022-11-10 LAB — PHOSPHORUS: Phosphorus: 3.3 mg/dL (ref 2.5–4.6)

## 2022-11-10 MED ORDER — POTASSIUM CHLORIDE CRYS ER 20 MEQ PO TBCR
20.0000 meq | EXTENDED_RELEASE_TABLET | ORAL | Status: AC
  Administered 2022-11-10 (×2): 20 meq via ORAL
  Filled 2022-11-10 (×2): qty 1

## 2022-11-10 MED ORDER — QUETIAPINE FUMARATE 50 MG PO TABS
75.0000 mg | ORAL_TABLET | Freq: Every day | ORAL | 0 refills | Status: AC
  Filled 2022-11-10: qty 45, 30d supply, fill #0

## 2022-11-10 MED ORDER — SERTRALINE HCL 100 MG PO TABS
100.0000 mg | ORAL_TABLET | Freq: Every day | ORAL | 0 refills | Status: DC
  Filled 2022-11-10: qty 30, 30d supply, fill #0

## 2022-11-10 MED ORDER — INSULIN PEN NEEDLE 31G X 5 MM MISC
1.0000 | Freq: Every day | 5 refills | Status: DC | PRN
  Filled 2022-11-10: qty 100, 20d supply, fill #0

## 2022-11-10 MED ORDER — ONDANSETRON HCL 4 MG PO TABS
4.0000 mg | ORAL_TABLET | Freq: Three times a day (TID) | ORAL | 0 refills | Status: DC | PRN
  Filled 2022-11-10: qty 20, 7d supply, fill #0

## 2022-11-10 MED ORDER — LISINOPRIL 10 MG PO TABS
10.0000 mg | ORAL_TABLET | Freq: Every day | ORAL | 0 refills | Status: AC
  Filled 2022-11-10: qty 30, 30d supply, fill #0

## 2022-11-10 MED ORDER — POTASSIUM CHLORIDE 10 MEQ/100ML IV SOLN
10.0000 meq | INTRAVENOUS | Status: AC
  Administered 2022-11-10 (×4): 10 meq via INTRAVENOUS
  Filled 2022-11-10 (×4): qty 100

## 2022-11-10 MED ORDER — NOVOLOG FLEXPEN 100 UNIT/ML ~~LOC~~ SOPN
0.0000 [IU] | PEN_INJECTOR | Freq: Three times a day (TID) | SUBCUTANEOUS | 0 refills | Status: DC
  Filled 2022-11-10: qty 15, 25d supply, fill #0

## 2022-11-10 MED ORDER — PEN NEEDLES 30G X 5 MM MISC
1.0000 | Freq: Four times a day (QID) | 3 refills | Status: DC
  Filled 2022-11-10: qty 100, fill #0

## 2022-11-10 MED ORDER — INSULIN GLARGINE 100 UNIT/ML SOLOSTAR PEN
15.0000 [IU] | PEN_INJECTOR | Freq: Two times a day (BID) | SUBCUTANEOUS | 0 refills | Status: DC
  Filled 2022-11-10: qty 15, 50d supply, fill #0

## 2022-11-10 NOTE — Progress Notes (Signed)
[facility] ADULT ICU REPLACEMENT PROTOCOL   The patient does apply for the [facility] Adult ICU Electrolyte Replacment Protocol based on the criteria listed below:   1.Exclusion criteria: TCTS, ECMO, Dialysis, and Myasthenia Gravis patients 2. Is GFR >/= 30 ml/min? Yes.    Patient's GFR today is > 60 3. Is SCr </= 2? Yes.   Patient's SCr is 0.45 mg/dL 4. Did SCr increase >/= 0.5 in 24 hours? No. 5.Pt's weight >40kg  Yes.   6. Abnormal electrolyte(s): K+ 3.2  7. Electrolytes replaced per protocol 8.  Call MD STAT for K+ </= 2.5, Phos </= 1, or Mag </= 1 Physician:  Dr Rhodia Albright, Jettie Booze 11/10/2022 4:08 AM

## 2022-11-10 NOTE — Progress Notes (Signed)
  Transition of Care [facility]) Screening Note   Patient Details  Name: [name] [name]. Date of Birth: 11/21/2000   [facility] of [facility]) CM/SW Contact:    Lavenia Atlas, RN Phone Number: 11/10/2022, 12:12 PM    Transition of Care Department [facility]) has reviewed patient and no TOC needs have been identified at this time. We will continue to monitor patient advancement through interdisciplinary progression rounds. If new patient transition needs arise, please place a TOC consult.

## 2022-11-10 NOTE — Progress Notes (Signed)
Hospital provided shirt and underwear for patient.  He requested shoes but no shoes were available.

## 2022-11-10 NOTE — Discharge Instructions (Addendum)
Follow with Lilland, Alana, DO in 5-7 days  Take you long acting insulin twice daily  Take your mealtime insulin based on sliding scale CBG 70 - 120: 0 units CBG 121 - 150: 3 units CBG 151 - 200: 4 units CBG 201 - 250: 7 units CBG 251 - 300: 11 units CBG 301 - 350: 15 units CBG 351 - 400: 20 units   Please get a complete blood count and chemistry panel checked by your Primary MD at your next visit, and again as instructed by your Primary MD. Please get your medications reviewed and adjusted by your Primary MD.  Please request your Primary MD to go over all Hospital Tests and Procedure/Radiological results at the follow up, please get all Hospital records sent to your Prim MD by signing hospital release before you go home.  In some cases, there will be blood work, cultures and biopsy results pending at the time of your discharge. Please request that your primary care M.D. goes through all the records of your hospital data and follows up on these results.  If you had Pneumonia of Lung problems at the Hospital: Please get a 2 view Chest X ray done in 6-8 weeks after hospital discharge or sooner if instructed by your Primary MD.  If you have Congestive Heart Failure: Please call your Cardiologist or Primary MD anytime you have any of the following symptoms:  1) 3 pound weight gain in 24 hours or 5 pounds in 1 week  2) shortness of breath, with or without a dry hacking cough  3) swelling in the hands, feet or stomach  4) if you have to sleep on extra pillows at night in order to breathe  Follow cardiac low salt diet and 1.5 lit/day fluid restriction.  If you have diabetes Accuchecks 4 times/day, Once in AM empty stomach and then before each meal. Log in all results and show them to your primary doctor at your next visit. If any glucose reading is under 80 or above 300 call your primary MD immediately.  If you have Seizure/Convulsions/Epilepsy: Please do not drive, operate heavy  machinery, participate in activities at heights or participate in high speed sports until you have seen by Primary MD or a Neurologist and advised to do so again. Per [facility] statutes, patients with seizures are not allowed to drive until they have been seizure-free for six months.  Use caution when using heavy equipment or power tools. Avoid working on ladders or at heights. Take showers instead of baths. Ensure the water temperature is not too high on the home water heater. Do not go swimming alone. Do not lock yourself in a room alone (i.e. bathroom). When caring for infants or small children, sit down when holding, feeding, or changing them to minimize risk of injury to the child in the event you have a seizure. Maintain good sleep hygiene. Avoid alcohol.   If you had Gastrointestinal Bleeding: Please ask your Primary MD to check a complete blood count within one week of discharge or at your next visit. Your endoscopic/colonoscopic biopsies that are pending at the time of discharge, will also need to followed by your Primary MD.  Get Medicines reviewed and adjusted. Please take all your medications with you for your next visit with your Primary MD  Please request your Primary MD to go over all hospital tests and procedure/radiological results at the follow up, please ask your Primary MD to get all Hospital records sent to his/her  office.  If you experience worsening of your admission symptoms, develop shortness of breath, life threatening emergency, suicidal or homicidal thoughts you must seek medical attention immediately by calling 911 or calling your MD immediately  if symptoms less severe.  You must read complete instructions/literature along with all the possible adverse reactions/side effects for all the Medicines you take and that have been prescribed to you. Take any new Medicines after you have completely understood and accpet all the possible adverse reactions/side effects.    Do not drive or operate heavy machinery when taking Pain medications.   Do not take more than prescribed Pain, Sleep and Anxiety Medications  Special Instructions: If you have smoked or chewed Tobacco  in the last 2 yrs please stop smoking, stop any regular Alcohol  and or any Recreational drug use.  Wear Seat belts while driving.  Please note You were cared for by a hospitalist during your hospital stay. If you have any questions about your discharge medications or the care you received while you were in the hospital after you are discharged, you can call the unit and asked to speak with the hospitalist on call if the hospitalist that took care of you is not available. Once you are discharged, your primary care physician will handle any further medical issues. Please note that NO REFILLS for any discharge medications will be authorized once you are discharged, as it is imperative that you return to your primary care physician (or establish a relationship with a primary care physician if you do not have one) for your aftercare needs so that they can reassess your need for medications and monitor your lab values.  You can reach the hospitalist office at phone [phone] or fax [phone]   If you do not have a primary care physician, you can call [phone] for a physician referral.  Activity: As tolerated with Full fall precautions use walker/cane & assistance as needed    Diet: diabetic  Disposition Home

## 2022-11-10 NOTE — Discharge Summary (Signed)
Physician Discharge Summary  [name] [name]. HYQ:[id number] DOB: 2000/10/09 DOA: 11/08/2022  PCP: Evelena Leyden, DO  Admit date: 11/08/2022 Discharge date: 11/10/2022  Admitted From: home Disposition:  home  Recommendations for Outpatient Follow-up:  Follow up with PCP in 1-2 weeks Please obtain BMP/CBC in one week  Home Health: none Equipment/Devices: none  Discharge Condition: stable CODE STATUS: Full code Diet Orders (From admission, onward)     Start     Ordered   11/09/22 0816  Diet Carb Modified Fluid consistency: Thin; Room service appropriate? Yes  Diet effective now       Question Answer Comment  Diet-HS Snack? Nothing   Calorie Level Medium 1600-2000   Fluid consistency: Thin   Room service appropriate? Yes      11/09/22 0815            HPI: Per admitting MD, 22 year old man with type 1 diabetes, brought in by EMS , with complaints of severe nausea, vomiting and bilateral flank pain for 3 days. His CBG was 432 by EMS and in the ED was more than 700 .he had rapid deep respirations and tachycardia with dry mucous membranes.  He reported that his last insulin use was few days ago. Initial labs showed potassium 5.5, glucose more than 700 bicarbonate of 8 with venous pH of 6.95.  He was given 1 amp of bicarb started on insulin drip, given 2 L of fluids.  He was given droperidol for severe nausea.  He was intermittently agitated and trying to get off the ED stretcher, hence PCCM consulted. He has a history of  prior recurrent admits for DKA  Hospital Course / Discharge diagnoses: Principal problem DKA in the setting of type 1 diabetes mellitus and nonadherence to insulin regimen -patient was admitted to the hospital with severe DKA, requiring ICU stay.  He was placed on insulin infusion, n.p.o., with improvement in his anion gap.  His DKA has resolved, CBGs are much better, and he was stable on subcutaneous insulin.  He is able to tolerate a regular diet.  He was  initially placed on antibiotics due to severe abdominal pain, however CT scan of the abdomen and pelvis was negative for acute findings.  With him returning to normal he will be discharged home in stable condition.  All his insulins were prescribed and filled prior to patient's discharge.  Active problems Medication nonadherence -counseled to have compliance with his insulin regimen at home Essential hypertension-continue lisinopril Mood disorder, NOS-continue Seroquel, Zoloft  Sepsis ruled out   Discharge Instructions   Allergies as of 11/10/2022       Reactions   Bee Venom Anaphylaxis   Fish Allergy Anaphylaxis   Shellfish Allergy Anaphylaxis   Tea Anaphylaxis   Carbamazepine    Liver swelling        Medication List     STOP taking these medications    haloperidol 5 MG tablet Commonly known as: HALDOL       TAKE these medications    Accu-Chek Guide test strip Generic drug: glucose blood Use as instructed   Accu-Chek Softclix Lancets lancets Use as instructed   blood glucose meter kit and supplies Use up to four times daily as directed.   insulin glargine 100 UNIT/ML Solostar Pen Commonly known as: LANTUS Inject 15 Units into the skin 2 (two) times daily. What changed:  how much to take when to take this   lisinopril 10 MG tablet Commonly known as: ZESTRIL Take 1 tablet (  10 mg total) by mouth daily.   NovoLOG FlexPen 100 UNIT/ML FlexPen Generic drug: insulin aspart Inject 0-20 Units into the skin 3 (three) times daily with meals. Per sliding scale   ondansetron 4 MG tablet Commonly known as: ZOFRAN Take 1 tablet (4 mg total) by mouth every 8 (eight) hours as needed for nausea or vomiting.   QUEtiapine 50 MG tablet Commonly known as: SEROQUEL Take 1 and 1/2 tablets (75 mg total) by mouth at bedtime.   sertraline 100 MG tablet Commonly known as: ZOLOFT Take 1 tablet (100 mg total) by mouth daily.        Follow-up Information     Lilland,  Alana, DO Follow up in 1 week(s).   Specialty: Family Medicine Contact information: [address] [place] [address] [phone]                 Consultations: PCCM  Procedures/Studies:  CT ABDOMEN PELVIS WO CONTRAST  Result Date: 11/09/2022 CLINICAL DATA:  Abdominal pain. Acute nonlocalized. Vomiting bilateral flank pain EXAM: CT ABDOMEN AND PELVIS WITHOUT CONTRAST TECHNIQUE: Multidetector CT imaging of the abdomen and pelvis was performed following the standard protocol without IV contrast. RADIATION DOSE REDUCTION: This exam was performed according to the departmental dose-optimization program which includes automated exposure control, adjustment of the mA and/or kV according to patient size and/or use of iterative reconstruction technique. COMPARISON:  CT 07/06/2022 FINDINGS: Lower chest: Lung bases are clear. Hepatobiliary: No focal hepatic lesion. Normal gallbladder. No biliary duct dilatation. Common bile duct is normal. Pancreas: Pancreas is normal. No ductal dilatation. No pancreatic inflammation. Spleen: Normal spleen Adrenals/urinary tract: Adrenal glands and kidneys are normal. The ureters and bladder normal. Bladder is distended rising above the umbilicus measuring 20 cm in craniocaudad dimension. Insert chronic finding present on prior CT. Stomach/Bowel: Stomach, small bowel, appendix, and cecum are normal. The colon and rectosigmoid colon are normal. Vascular/Lymphatic: Abdominal aorta is normal caliber. No periportal or retroperitoneal adenopathy. No pelvic adenopathy. Reproductive: Prostate normal Other: No free fluid. Musculoskeletal: No aggressive osseous lesion. IMPRESSION: 1. No nephrolithiasis or ureterolithiasis.  No obstructive uropathy. 2. Distended bladder extends above the umbilicus. Similar findings on CT 07/06/2022. 3. Normal appendix. . Electronically Signed   By: Genevive Bi M.D.   On: 11/09/2022 18:38   Portable chest x-ray (1 view)  Result Date:  11/08/2022 CLINICAL DATA:  Nausea, vomiting, and bilateral flank pain. EXAM: PORTABLE CHEST 1 VIEW COMPARISON:  C chest hest radiograph 04/13/2022 FINDINGS: Normal cardiomediastinal contours. Lung volumes are low. Lungs are clear. No pneumothorax or large pleural effusion. No acute finding in the visualized skeleton. IMPRESSION: No acute cardiopulmonary finding. Electronically Signed   By: Emmaline Kluver M.D.   On: 11/08/2022 16:50     Subjective: -Feels sore from all the vomiting but overall much better  Discharge Exam: BP 118/75   Pulse 76   Temp (!) 97.3 F (36.3 C) (Oral)   Resp 13   Ht 5' 10.5" (1.791 m)   Wt 71.5 kg   SpO2 98%   BMI 22.30 kg/m   General: Pt is alert, awake, not in acute distress Cardiovascular: RRR, S1/S2 +, no rubs, no gallops Respiratory: CTA bilaterally, no wheezing, no rhonchi Abdominal: Soft, NT, ND, bowel sounds + Extremities: no edema, no cyanosis    The results of significant diagnostics from this hospitalization (including imaging, microbiology, ancillary and laboratory) are listed below for reference.     Microbiology: Recent Results (from the past 240 hour(s))  MRSA Next Gen by PCR, Nasal     Status: None   Collection Time: 11/08/22  8:42 PM   Specimen: Nasal Mucosa; Nasal Swab  Result Value Ref Range Status   MRSA by PCR Next Gen NOT DETECTED NOT DETECTED Final    Comment: (NOTE) The GeneXpert MRSA Assay (FDA approved for NASAL specimens only), is one component of a comprehensive MRSA colonization surveillance program. It is not intended to diagnose MRSA infection nor to guide or monitor treatment for MRSA infections. Test performance is not FDA approved in patients less than 29 years old. Performed at [facility], [address], [place], [place] [address]      Labs: Basic Metabolic Panel: Recent Labs  Lab 11/08/22 2349 11/09/22 0305 11/09/22 0821 11/09/22 1434 11/10/22 0318  NA 137 137 135 135 139  K  3.8 3.6 3.7 4.1 3.2*  CL 105 107 106 104 106  CO2 17* 19* 16* 19* 26  GLUCOSE 139* 121* 180* 257* 90  BUN 13 12 10 9 9   CREATININE 0.79 0.73 0.55* 0.79 0.45*  CALCIUM 8.2* 7.8* 7.6* 7.8* 7.8*  MG  --   --   --   --  1.9  PHOS  --   --   --   --  3.3   Liver Function Tests: No results for input(s): "AST", "ALT", "ALKPHOS", "BILITOT", "PROT", "ALBUMIN" in the last 168 hours. CBC: Recent Labs  Lab 11/08/22 1405 11/08/22 1416 11/09/22 1434 11/10/22 0318  WBC 20.1*  --  7.8 5.1  NEUTROABS 13.6*  --  5.1 2.6  HGB 16.0 18.4* 11.0* 11.4*  HCT 52.5* 54.0* 32.9* 34.7*  MCV 95.6  --  87.5 89.0  PLT 482*  --  225 201   CBG: Recent Labs  Lab 11/09/22 1642 11/09/22 1859 11/09/22 2018 11/10/22 0022 11/10/22 0317  GLUCAP 202* 175* 219* 131* 71   Hgb A1c Recent Labs    11/08/22 1407  HGBA1C 9.5*   Lipid Profile No results for input(s): "CHOL", "HDL", "LDLCALC", "TRIG", "CHOLHDL", "LDLDIRECT" in the last 72 hours. Thyroid function studies No results for input(s): "TSH", "T4TOTAL", "T3FREE", "THYROIDAB" in the last 72 hours.  Invalid input(s): "FREET3" Urinalysis    Component Value Date/Time   COLORURINE STRAW (A) 11/08/2022 1559   APPEARANCEUR CLEAR 11/08/2022 1559   LABSPEC 1.015 11/08/2022 1559   PHURINE 5.0 11/08/2022 1559   GLUCOSEU >=500 (A) 11/08/2022 1559   HGBUR SMALL (A) 11/08/2022 1559   BILIRUBINUR NEGATIVE 11/08/2022 1559   KETONESUR 80 (A) 11/08/2022 1559   PROTEINUR NEGATIVE 11/08/2022 1559   UROBILINOGEN 0.2 09/18/2019 1339   NITRITE NEGATIVE 11/08/2022 1559   LEUKOCYTESUR NEGATIVE 11/08/2022 1559    FURTHER DISCHARGE INSTRUCTIONS:   Get Medicines reviewed and adjusted: Please take all your medications with you for your next visit with your Primary MD   Laboratory/radiological data: Please request your Primary MD to go over all hospital tests and procedure/radiological results at the follow up, please ask your Primary MD to get all Hospital records  sent to his/her office.   In some cases, they will be blood work, cultures and biopsy results pending at the time of your discharge. Please request that your primary care M.D. goes through all the records of your hospital data and follows up on these results.   Also Note the following: If you experience worsening of your admission symptoms, develop shortness of breath, life threatening emergency, suicidal or homicidal thoughts you must seek medical attention immediately by  calling 911 or calling your MD immediately  if symptoms less severe.   You must read complete instructions/literature along with all the possible adverse reactions/side effects for all the Medicines you take and that have been prescribed to you. Take any new Medicines after you have completely understood and accpet all the possible adverse reactions/side effects.    Do not drive when taking Pain medications or sleeping medications (Benzodaizepines)   Do not take more than prescribed Pain, Sleep and Anxiety Medications. It is not advisable to combine anxiety,sleep and pain medications without talking with your primary care practitioner   Special Instructions: If you have smoked or chewed Tobacco  in the last 2 yrs please stop smoking, stop any regular Alcohol  and or any Recreational drug use.   Wear Seat belts while driving.   Please note: You were cared for by a hospitalist during your hospital stay. Once you are discharged, your primary care physician will handle any further medical issues. Please note that NO REFILLS for any discharge medications will be authorized once you are discharged, as it is imperative that you return to your primary care physician (or establish a relationship with a primary care physician if you do not have one) for your post hospital discharge needs so that they can reassess your need for medications and monitor your lab values.  Time coordinating discharge: 35 minutes  SIGNED:  Pamella Pert,  MD, PhD 11/10/2022, 7:20 AM

## 2022-11-11 ENCOUNTER — Telehealth: Payer: Self-pay

## 2022-11-11 NOTE — Transitions of Care (Post Inpatient/ED Visit) (Unsigned)
   11/11/2022  Name: [name] [name]. MRN: [id number] DOB: [birth year]  Today's TOC FU Call Status: Today's TOC FU Call Status:: Unsuccessul Call (1st Attempt) Unsuccessful Call (1st Attempt) Date: 11/11/22  Attempted to reach the patient regarding the most recent Inpatient/ED visit.  Follow Up Plan: Additional outreach attempts will be made to reach the patient to complete the Transitions of Care (Post Inpatient/ED visit) call.   Signature Karena Addison, LPN [facility] Nurse Health Advisor Direct Dial [phone]

## 2022-11-12 NOTE — Transitions of Care (Post Inpatient/ED Visit) (Unsigned)
   11/12/2022  Name: [name] [name]. MRN: [id number] DOB: 11/16/2000  Today's TOC FU Call Status: Today's TOC FU Call Status:: Unsuccessful Call (2nd Attempt) Unsuccessful Call (1st Attempt) Date: 11/11/22 Unsuccessful Call (2nd Attempt) Date: 11/12/22  Attempted to reach the patient regarding the most recent Inpatient/ED visit.  Follow Up Plan: Additional outreach attempts will be made to reach the patient to complete the Transitions of Care (Post Inpatient/ED visit) call.   Signature Karena Addison, LPN [facility] Nurse Health Advisor Direct Dial [phone]

## 2022-11-13 NOTE — Transitions of Care (Post Inpatient/ED Visit) (Signed)
   11/13/2022  Name: [name] [name]. MRN: [id number] DOB: [birth year]  Today's TOC FU Call Status: Today's TOC FU Call Status:: Unsuccessful Call (3rd Attempt) Unsuccessful Call (1st Attempt) Date: 11/11/22 Unsuccessful Call (2nd Attempt) Date: 11/12/22 Unsuccessful Call (3rd Attempt) Date: 11/13/22  Attempted to reach the patient regarding the most recent Inpatient/ED visit.  Follow Up Plan: No further outreach attempts will be made at this time. We have been unable to contact the patient.  Signature Karena Addison, LPN [facility] Nurse Health Advisor Direct Dial [phone]

## 2022-11-19 ENCOUNTER — Ambulatory Visit: Payer: Medicaid Other | Admitting: Family Medicine

## 2022-12-02 ENCOUNTER — Ambulatory Visit: Payer: Medicaid Other | Admitting: Family Medicine

## 2022-12-09 ENCOUNTER — Telehealth: Payer: Self-pay

## 2022-12-09 NOTE — Transitions of Care (Post Inpatient/ED Visit) (Signed)
   12/09/2022  Name: [name] [name]. MRN: [id number] DOB: 03/10/2001  Today's TOC FU Call Status: Today's TOC FU Call Status:: Unsuccessul Call (1st Attempt) Unsuccessful Call (1st Attempt) Date: 12/09/22  Attempted to reach the patient regarding the most recent Inpatient/ED visit.  Follow Up Plan: Additional outreach attempts will be made to reach the patient to complete the Transitions of Care (Post Inpatient/ED visit) call.   Signature Karena Addison, LPN [facility] Nurse Health Advisor Direct Dial [phone]

## 2022-12-10 NOTE — Transitions of Care (Post Inpatient/ED Visit) (Signed)
   12/10/2022  Name: [name] [name]. MRN: [id number] DOB: 2001-04-26  Today's TOC FU Call Status: Today's TOC FU Call Status:: Unsuccessful Call (2nd Attempt) Unsuccessful Call (1st Attempt) Date: 12/09/22 Unsuccessful Call (2nd Attempt) Date: 12/10/22  Attempted to reach the patient regarding the most recent Inpatient/ED visit.  Follow Up Plan: No further outreach attempts will be made at this time. We have been unable to contact the patient.  Signature Karena Addison, LPN [facility] Nurse Health Advisor Direct Dial [phone]

## 2022-12-11 NOTE — Transitions of Care (Post Inpatient/ED Visit) (Signed)
   12/11/2022  Name: [name] [name]. MRN: [id number] DOB: [birth year]  Today's TOC FU Call Status: Today's TOC FU Call Status:: Unsuccessful Call (2nd Attempt) Unsuccessful Call (1st Attempt) Date: 12/09/22 Unsuccessful Call (2nd Attempt) Date: 12/11/22  Attempted to reach the patient regarding the most recent Inpatient/ED visit.  Follow Up Plan: Additional outreach attempts will be made to reach the patient to complete the Transitions of Care (Post Inpatient/ED visit) call.   Signature Karena Addison, LPN [facility] Nurse Health Advisor Direct Dial [phone]

## 2023-01-22 ENCOUNTER — Telehealth: Payer: Self-pay

## 2023-01-22 NOTE — Transitions of Care (Post Inpatient/ED Visit) (Signed)
   01/22/2023  Name: [name] [name]. MRN: [id number] DOB: [birth year]  Today's TOC FU Call Status: Today's TOC FU Call Status:: Unsuccessul Call (1st Attempt) Unsuccessful Call (1st Attempt) Date: 01/22/23  Attempted to reach the patient regarding the most recent Inpatient/ED visit.  Follow Up Plan: Additional outreach attempts will be made to reach the patient to complete the Transitions of Care (Post Inpatient/ED visit) call.   Signature Karena Addison, LPN [facility] Nurse Health Advisor Direct Dial [phone]

## 2023-01-26 NOTE — Transitions of Care (Post Inpatient/ED Visit) (Unsigned)
   01/26/2023  Name: [name] [name]. MRN: [id number] DOB: Jul 29, 2000  Today's TOC FU Call Status: Today's TOC FU Call Status:: Unsuccessful Call (2nd Attempt) Unsuccessful Call (1st Attempt) Date: 01/22/23 Unsuccessful Call (2nd Attempt) Date: 01/26/23  Attempted to reach the patient regarding the most recent Inpatient/ED visit.  Follow Up Plan: Additional outreach attempts will be made to reach the patient to complete the Transitions of Care (Post Inpatient/ED visit) call.   Signature Karena Addison, LPN [facility] Nurse Health Advisor Direct Dial [phone]

## 2023-01-27 NOTE — Transitions of Care (Post Inpatient/ED Visit) (Signed)
   01/27/2023  Name: [name] [name]. MRN: [id number] DOB: [birth year]  Today's TOC FU Call Status: Today's TOC FU Call Status:: Unsuccessful Call (3rd Attempt) Unsuccessful Call (1st Attempt) Date: 01/22/23 Unsuccessful Call (2nd Attempt) Date: 01/26/23 Unsuccessful Call (3rd Attempt) Date: 01/27/23  Attempted to reach the patient regarding the most recent Inpatient/ED visit.  Follow Up Plan: No further outreach attempts will be made at this time. We have been unable to contact the patient.  Signature Karena Addison, LPN [facility] Nurse Health Advisor Direct Dial [phone]

## 2023-02-02 ENCOUNTER — Telehealth: Payer: Self-pay

## 2023-02-02 NOTE — Transitions of Care (Post Inpatient/ED Visit) (Unsigned)
   02/02/2023  Name: [name] [name]. MRN: [id number] DOB: April 13, 2001  Today's TOC FU Call Status: Today's TOC FU Call Status:: Unsuccessful Call (1st Attempt) Unsuccessful Call (1st Attempt) Date: 02/02/23  Attempted to reach the patient regarding the most recent Inpatient/ED visit.  Follow Up Plan: Additional outreach attempts will be made to reach the patient to complete the Transitions of Care (Post Inpatient/ED visit) call.   Signature Karena Addison, LPN [facility] Nurse Health Advisor Direct Dial [phone]

## 2023-02-03 NOTE — Transitions of Care (Post Inpatient/ED Visit) (Unsigned)
   02/03/2023  Name: [name] [name]. MRN: [id number] DOB: 2000/11/30  Today's TOC FU Call Status: Today's TOC FU Call Status:: Unsuccessful Call (2nd Attempt) Unsuccessful Call (1st Attempt) Date: 02/02/23 Unsuccessful Call (2nd Attempt) Date: 02/03/23  Attempted to reach the patient regarding the most recent Inpatient/ED visit.  Follow Up Plan: Additional outreach attempts will be made to reach the patient to complete the Transitions of Care (Post Inpatient/ED visit) call.   Signature Karena Addison, LPN [facility] Nurse Health Advisor Direct Dial [phone]

## 2023-02-04 NOTE — Transitions of Care (Post Inpatient/ED Visit) (Signed)
   02/04/2023  Name: Mikos Enter. MRN: [id number] DOB: 06-Jul-2000  Today's TOC FU Call Status: Today's TOC FU Call Status:: Unsuccessful Call (3rd Attempt) Unsuccessful Call (1st Attempt) Date: 02/02/23 Unsuccessful Call (2nd Attempt) Date: 02/03/23 Unsuccessful Call (3rd Attempt) Date: 02/04/23  Attempted to reach the patient regarding the most recent Inpatient/ED visit.  Follow Up Plan: No further outreach attempts will be made at this time. We have been unable to contact the patient.  Signature Karena Addison, LPN [facility] Nurse Health Advisor Direct Dial [phone]

## 2023-02-27 ENCOUNTER — Ambulatory Visit (INDEPENDENT_AMBULATORY_CARE_PROVIDER_SITE_OTHER): Payer: Medicaid Other | Admitting: Family Medicine

## 2023-02-27 ENCOUNTER — Encounter: Payer: Self-pay | Admitting: Family Medicine

## 2023-02-27 VITALS — BP 129/91 | HR 108 | Ht 70.5 in | Wt 173.0 lb

## 2023-02-27 DIAGNOSIS — F1190 Opioid use, unspecified, uncomplicated: Secondary | ICD-10-CM

## 2023-02-27 DIAGNOSIS — E1065 Type 1 diabetes mellitus with hyperglycemia: Secondary | ICD-10-CM | POA: Diagnosis not present

## 2023-02-27 DIAGNOSIS — B182 Chronic viral hepatitis C: Secondary | ICD-10-CM | POA: Diagnosis not present

## 2023-02-27 DIAGNOSIS — G40309 Generalized idiopathic epilepsy and epileptic syndromes, not intractable, without status epilepticus: Secondary | ICD-10-CM | POA: Diagnosis not present

## 2023-02-27 DIAGNOSIS — F250 Schizoaffective disorder, bipolar type: Secondary | ICD-10-CM | POA: Diagnosis not present

## 2023-02-27 LAB — POCT URINALYSIS DIP (MANUAL ENTRY)
Bilirubin, UA: NEGATIVE
Blood, UA: NEGATIVE
Glucose, UA: >=1000 mg/dL — AB
Ketones, POC UA: NEGATIVE mg/dL
Leukocytes, UA: NEGATIVE
Nitrite, UA: NEGATIVE
Protein Ur, POC: NEGATIVE mg/dL
Spec Grav, UA: 1.015 (ref 1.010–1.025)
Urobilinogen, UA: 0.2 E.U./dL
pH, UA: 6 (ref 5.0–8.0)

## 2023-02-27 LAB — POCT GLYCOSYLATED HEMOGLOBIN (HGB A1C): HbA1c, POC (controlled diabetic range): 10.7 % — AB (ref 0.0–7.0)

## 2023-02-27 LAB — POCT HEMOGLOBIN: Hemoglobin: 12.4 g/dL (ref 11–14.6)

## 2023-02-27 MED ORDER — BUPRENORPHINE HCL-NALOXONE HCL 8-2 MG SL SUBL
0.5000 | SUBLINGUAL_TABLET | Freq: Three times a day (TID) | SUBLINGUAL | 0 refills | Status: AC
Start: 2023-02-27 — End: 2023-03-13

## 2023-02-27 MED ORDER — INSULIN PEN NEEDLE 31G X 5 MM MISC: 1.0000 | Freq: Every day | 5 refills | Status: DC | PRN

## 2023-02-27 MED ORDER — INSULIN GLARGINE 100 UNIT/ML SOLOSTAR PEN
30.0000 [IU] | PEN_INJECTOR | Freq: Two times a day (BID) | SUBCUTANEOUS | 0 refills | Status: DC
Start: 2023-02-27 — End: 2023-03-01

## 2023-02-27 MED ORDER — BLOOD GLUCOSE METER KIT: PACK | 0 refills | Status: DC

## 2023-02-27 MED ORDER — ACCU-CHEK GUIDE VI STRP: ORAL_STRIP | 12 refills | Status: DC

## 2023-02-27 MED ORDER — NOVOLOG FLEXPEN 100 UNIT/ML ~~LOC~~ SOPN
0.0000 [IU] | PEN_INJECTOR | Freq: Three times a day (TID) | SUBCUTANEOUS | 0 refills | Status: DC
Start: 2023-02-27 — End: 2023-03-01

## 2023-02-27 MED ORDER — NALOXONE HCL 4 MG/0.1ML NA LIQD
1.0000 | Freq: Once | NASAL | 0 refills | Status: AC
Start: 2023-02-27 — End: 2023-02-27

## 2023-02-27 MED ORDER — ACCU-CHEK SOFTCLIX LANCETS MISC: 12 refills | Status: DC

## 2023-02-27 NOTE — Assessment & Plan Note (Signed)
Referral to neurology given reported history of epilepsy, reports that he should be on Topamax but has not been on this for several weeks

## 2023-02-27 NOTE — Patient Instructions (Addendum)
Please take Lantus 30 units twice daily, and Novolog sliding scale with meals.  Please keep an eye on your blood sugars at home.  I have sent in supplies for this.  I have placed referrals to endocrinology, infectious disease, neurology, and psychiatry.  You should receive calls about these within the next 2 weeks.  I have sent in a 2-week supply of Suboxone.  Please follow-up at your appointment on September 10.

## 2023-02-27 NOTE — Assessment & Plan Note (Signed)
Referral to infectious disease, this was placed at a previous visit but he was lost to follow-up

## 2023-02-27 NOTE — Assessment & Plan Note (Addendum)
A1c 10.7 from 9.5 in May.  UA today with glucosuria but no ketones.  Check CMP (given abdominal pain and hep C as below) to evaluate for DKA.  Check UACR. Will restart his insulin based on the most recent regimen that he reports.  30 units Lantus twice daily with NovoLog sliding scale with meals.  Sent prescription for glucometer, needles, lancets.  Advised home CBG checks.  Appointment made September 10 for follow-up.  Referral placed to endocrinology given that he used to follow with peds Endo

## 2023-02-27 NOTE — Assessment & Plan Note (Signed)
Referral to psychiatry for reported history of schizophrenia/schizoaffective disorder, reports use of Seroquel, Haldol, mirtazapine, and Zoloft which he has been out of for several weeks.

## 2023-02-27 NOTE — Progress Notes (Signed)
SUBJECTIVE:   CHIEF COMPLAINT / HPI:   Here for checkup Recently returned from [place], [place].  States he was on suboxone with [facility] in [place], but has been out of it for 2 days and has been feeling unwell without it (mild abd pain and nausea)  Hx T1DM on insulin. Hx multiple hospitalizations for DKA. Has been out of insulin for about 1 week. Has been using his friend's lispro and lantus.  Has been taking 30u Lantus BID. Has been taking Lispro 1u for every 10g carbs per meal, which has been roughly 8u TID. Has been unable to check sugars at home. Reports taking lantus and lispro today.  Last A1c: 9.5 (11/08/2022) - 10.7 today Denies polyuria, polydipsia, N/V, chest pain, shortness of breath  Hx hepatitis C (never able to f/u with ID), epilepsy (reports he is supposed to be on topamax but hasn't had this for several months), paranoid schizophrenia (reports hx of taking seroquel, haldol, mirtazipine, zoloft but hasn't had any of these for 2-3 weeks) Requesting referral to endocrinology, ID, neurology, psychiatry for management of his conditions - has been unable to establish care due to being out of state and has been out of medications.   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   Migraine    "maybe once/month; it's severe" (06/17/2013)   Migraine without status migrainosus, not intractable 12/13/2013   ODD (oppositional defiant disorder)    Partial small bowel obstruction (HCC)  11/11/2021   SBO (small bowel obstruction) (HCC) 11/11/2021   Sickle cell trait (HCC)    Suicidal ideation 10/06/2019   Suicidal ideations 03/31/2014   Type I diabetes mellitus (HCC)    "that's what they're thinking now" (06/17/2013)   Vision abnormalities    "takes him longer to focus cause; from his sz" (06/17/2013)    OBJECTIVE:  BP (!) 129/91   Pulse (!) 108   Ht 5' 10.5" (1.791 m)   Wt 173 lb (78.5 kg)   SpO2 100%   BMI 24.47 kg/m   General: NAD, pleasant, able to participate in exam Cardiac: RRR, no murmurs auscultated Respiratory: CTAB, normal WOB Abdomen: soft, generalized moderate tenderness, non-distended, normoactive bowel sounds Extremities: warm and well perfused, no edema or cyanosis Skin: warm and dry, no rashes noted Neuro: alert, no obvious focal deficits, speech normal Psych: Normal affect and mood  ASSESSMENT/PLAN:   Assessment & Plan Opioid use disorder Reports Suboxone therapy, has been out of this for 2 days.  Restart Suboxone 8-2, 0.5 tablets 3 times daily per his most recent prescription which the patient showed me. Will obtain records from outside facility in [place]. Check UDS.  Appointment made September 10 for follow-up.  Prescribed Narcan nasal spray as well. Uncontrolled type 1 diabetes mellitus with hyperglycemia, with long-term current use of insulin (HCC) A1c 10.7 from 9.5 in May.  UA today with glucosuria but no ketones.  Check CMP (given abdominal pain and hep C as below) to evaluate for DKA.  Check UACR. Will restart his insulin based on the most recent regimen that he reports.  30 units Lantus twice daily with NovoLog sliding scale with meals.  Sent prescription for glucometer, needles, lancets.  Advised home CBG checks.  Appointment made September 10 for follow-up.  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 Hep C w/o coma, chronic (HCC) Referral to infectious disease, this was placed at a previous visit but he was lost to follow-up    Meds ordered this encounter  Medications   buprenorphine-naloxone (SUBOXONE) 8-2 mg SUBL SL tablet    Sig: Place 0.5 tablets under the tongue 3 (three) times daily for 14 days.    Dispense:  21 tablet    Refill:  0   insulin glargine (LANTUS) 100 UNIT/ML Solostar Pen    Sig: Inject 30 Units into the skin 2 (two) times daily.    Dispense:  15 mL    Refill:  0   insulin aspart (NOVOLOG FLEXPEN) 100 UNIT/ML FlexPen    Sig: Inject 0-20 Units into the skin 3 (three) times daily with meals. Per sliding scale    Dispense:  15 mL    Refill:  0   blood glucose meter kit and supplies    Sig: Use up to four times daily as directed.    Dispense:  1 each    Refill:  0    (FOR ICD-10 E10.9, E11.9).    Order Specific Question:   Number of strips    Answer:   104    Order Specific Question:   Number of lancets    Answer:   60   Accu-Chek Softclix Lancets lancets    Sig: Use as instructed    Dispense:  100 each    Refill:  12   glucose blood (ACCU-CHEK GUIDE) test strip    Sig: Use as instructed    Dispense:  100 each    Refill:  12   Insulin Pen Needle 31G X 5 MM MISC    Sig: Use to inject insulin 5 times daily as directed.    Dispense:  100 each    Refill:  5   naloxone (NARCAN) nasal spray 4 mg/0.1 mL    Sig: Place 1 spray into the nose once for 1 dose. For opioid overdose. Please call 911 if you need to use this.    Dispense:  1 each    Refill:  0   Return in 11 days (on 03/10/2023).  Vonna Drafts, MD [name]

## 2023-02-28 ENCOUNTER — Other Ambulatory Visit: Payer: Self-pay

## 2023-02-28 ENCOUNTER — Encounter (HOSPITAL_COMMUNITY): Payer: Self-pay | Admitting: Emergency Medicine

## 2023-02-28 ENCOUNTER — Observation Stay (HOSPITAL_COMMUNITY)
Admission: EM | Admit: 2023-02-28 | Discharge: 2023-03-01 | Discharge status: Against Medical Advice | Payer: Medicaid Other | Attending: Family Medicine | Admitting: Family Medicine

## 2023-02-28 DIAGNOSIS — F909 Attention-deficit hyperactivity disorder, unspecified type: Secondary | ICD-10-CM | POA: Diagnosis present

## 2023-02-28 DIAGNOSIS — F419 Anxiety disorder, unspecified: Secondary | ICD-10-CM | POA: Diagnosis present

## 2023-02-28 DIAGNOSIS — T383X6A Underdosing of insulin and oral hypoglycemic [antidiabetic] drugs, initial encounter: Secondary | ICD-10-CM | POA: Diagnosis present

## 2023-02-28 DIAGNOSIS — E1010 Type 1 diabetes mellitus with ketoacidosis without coma: Principal | ICD-10-CM | POA: Diagnosis present

## 2023-02-28 DIAGNOSIS — Z5329 Procedure and treatment not carried out because of patient's decision for other reasons: Secondary | ICD-10-CM | POA: Diagnosis not present

## 2023-02-28 DIAGNOSIS — G40909 Epilepsy, unspecified, not intractable, without status epilepticus: Secondary | ICD-10-CM | POA: Diagnosis present

## 2023-02-28 DIAGNOSIS — Z9109 Other allergy status, other than to drugs and biological substances: Secondary | ICD-10-CM

## 2023-02-28 DIAGNOSIS — D573 Sickle-cell trait: Secondary | ICD-10-CM | POA: Diagnosis present

## 2023-02-28 DIAGNOSIS — N179 Acute kidney failure, unspecified: Secondary | ICD-10-CM | POA: Diagnosis present

## 2023-02-28 DIAGNOSIS — R339 Retention of urine, unspecified: Secondary | ICD-10-CM | POA: Diagnosis present

## 2023-02-28 DIAGNOSIS — R531 Weakness: Secondary | ICD-10-CM | POA: Diagnosis present

## 2023-02-28 DIAGNOSIS — E1042 Type 1 diabetes mellitus with diabetic polyneuropathy: Secondary | ICD-10-CM | POA: Diagnosis present

## 2023-02-28 DIAGNOSIS — F603 Borderline personality disorder: Secondary | ICD-10-CM | POA: Diagnosis present

## 2023-02-28 DIAGNOSIS — Z91030 Bee allergy status: Secondary | ICD-10-CM

## 2023-02-28 DIAGNOSIS — B182 Chronic viral hepatitis C: Secondary | ICD-10-CM | POA: Diagnosis present

## 2023-02-28 DIAGNOSIS — R7401 Elevation of levels of liver transaminase levels: Secondary | ICD-10-CM

## 2023-02-28 DIAGNOSIS — Z888 Allergy status to other drugs, medicaments and biological substances status: Secondary | ICD-10-CM

## 2023-02-28 DIAGNOSIS — R748 Abnormal levels of other serum enzymes: Secondary | ICD-10-CM | POA: Insufficient documentation

## 2023-02-28 DIAGNOSIS — Z91013 Allergy to seafood: Secondary | ICD-10-CM

## 2023-02-28 DIAGNOSIS — E1110 Type 2 diabetes mellitus with ketoacidosis without coma: Secondary | ICD-10-CM | POA: Diagnosis present

## 2023-02-28 DIAGNOSIS — G43909 Migraine, unspecified, not intractable, without status migrainosus: Secondary | ICD-10-CM | POA: Diagnosis present

## 2023-02-28 DIAGNOSIS — K5090 Crohn's disease, unspecified, without complications: Secondary | ICD-10-CM | POA: Diagnosis present

## 2023-02-28 DIAGNOSIS — F259 Schizoaffective disorder, unspecified: Secondary | ICD-10-CM | POA: Diagnosis present

## 2023-02-28 DIAGNOSIS — Z91199 Patient's noncompliance with other medical treatment and regimen due to unspecified reason: Secondary | ICD-10-CM

## 2023-02-28 DIAGNOSIS — J45909 Unspecified asthma, uncomplicated: Secondary | ICD-10-CM | POA: Diagnosis present

## 2023-02-28 DIAGNOSIS — F17210 Nicotine dependence, cigarettes, uncomplicated: Secondary | ICD-10-CM | POA: Diagnosis present

## 2023-02-28 DIAGNOSIS — F319 Bipolar disorder, unspecified: Secondary | ICD-10-CM | POA: Diagnosis present

## 2023-02-28 DIAGNOSIS — I10 Essential (primary) hypertension: Secondary | ICD-10-CM | POA: Diagnosis present

## 2023-02-28 DIAGNOSIS — E1065 Type 1 diabetes mellitus with hyperglycemia: Secondary | ICD-10-CM | POA: Diagnosis present

## 2023-02-28 DIAGNOSIS — R1011 Right upper quadrant pain: Secondary | ICD-10-CM | POA: Diagnosis present

## 2023-02-28 DIAGNOSIS — Z794 Long term (current) use of insulin: Secondary | ICD-10-CM

## 2023-02-28 DIAGNOSIS — Z79899 Other long term (current) drug therapy: Secondary | ICD-10-CM

## 2023-02-28 LAB — CBC
HCT: 37.6 % — ABNORMAL LOW (ref 39.0–52.0)
Hemoglobin: 12.2 g/dL — ABNORMAL LOW (ref 13.0–17.0)
MCH: 26 pg (ref 26.0–34.0)
MCHC: 32.4 g/dL (ref 30.0–36.0)
MCV: 80.2 fL (ref 80.0–100.0)
Platelets: 372 10*3/uL (ref 150–400)
RBC: 4.69 MIL/uL (ref 4.22–5.81)
RDW: 15.4 % (ref 11.5–15.5)
WBC: 5.1 10*3/uL (ref 4.0–10.5)
nRBC: 0 % (ref 0.0–0.2)

## 2023-02-28 LAB — URINALYSIS, ROUTINE W REFLEX MICROSCOPIC
Bacteria, UA: NONE SEEN
Bilirubin Urine: NEGATIVE
Glucose, UA: >=500 mg/dL — AB
Hgb urine dipstick: NEGATIVE
Ketones, ur: 20 mg/dL — AB
Leukocytes,Ua: NEGATIVE
Nitrite: NEGATIVE
Protein, ur: NEGATIVE mg/dL
Specific Gravity, Urine: 1.02 (ref 1.005–1.030)
pH: 7 (ref 5.0–8.0)

## 2023-02-28 LAB — I-STAT CHEM 8, ED
BUN: 26 mg/dL — ABNORMAL HIGH (ref 6–20)
Calcium, Ion: 1.09 mmol/L — ABNORMAL LOW (ref 1.15–1.40)
Chloride: 97 mmol/L — ABNORMAL LOW (ref 98–111)
Creatinine, Ser: 0.6 mg/dL — ABNORMAL LOW (ref 0.61–1.24)
Glucose, Bld: >700 mg/dL (ref 70–99)
HCT: 39 % (ref 39.0–52.0)
Hemoglobin: 13.3 g/dL (ref 13.0–17.0)
Potassium: 5.6 mmol/L — ABNORMAL HIGH (ref 3.5–5.1)
Sodium: 126 mmol/L — ABNORMAL LOW (ref 135–145)
TCO2: 17 mmol/L — ABNORMAL LOW (ref 22–32)

## 2023-02-28 LAB — CBG MONITORING, ED
Glucose-Capillary: 580 mg/dL (ref 70–99)
Glucose-Capillary: >600 mg/dL (ref 70–99)

## 2023-02-28 MED ORDER — LACTATED RINGERS IV BOLUS
1000.0000 mL | Freq: Once | INTRAVENOUS | Status: AC
  Administered 2023-02-28: 1000 mL via INTRAVENOUS

## 2023-02-28 MED ORDER — METOCLOPRAMIDE HCL 5 MG/ML IJ SOLN
10.0000 mg | Freq: Once | INTRAMUSCULAR | Status: AC
  Administered 2023-02-28: 10 mg via INTRAVENOUS
  Filled 2023-02-28: qty 2

## 2023-02-28 MED ORDER — SODIUM CHLORIDE 0.9 % IV SOLN: 1000.0000 mL | INTRAVENOUS | Status: DC

## 2023-02-28 MED ORDER — KETOROLAC TROMETHAMINE 15 MG/ML IJ SOLN
15.0000 mg | Freq: Once | INTRAMUSCULAR | Status: AC
  Administered 2023-02-28: 15 mg via INTRAVENOUS
  Filled 2023-02-28: qty 1

## 2023-02-28 MED ORDER — SODIUM CHLORIDE 0.9 % IV BOLUS (SEPSIS)
1000.0000 mL | Freq: Once | INTRAVENOUS | Status: AC
  Administered 2023-03-01: 1000 mL via INTRAVENOUS

## 2023-02-28 MED ORDER — DEXTROSE 50 % IV SOLN: 0.0000 mL | INTRAVENOUS | Status: DC | PRN

## 2023-02-28 MED ORDER — INSULIN REGULAR(HUMAN) IN NACL 100-0.9 UT/100ML-% IV SOLN
INTRAVENOUS | Status: DC
  Administered 2023-02-28: 11.5 [IU]/h via INTRAVENOUS
  Filled 2023-02-28: qty 100

## 2023-02-28 MED ORDER — DEXTROSE IN LACTATED RINGERS 5 % IV SOLN: INTRAVENOUS | Status: DC

## 2023-02-28 NOTE — ED Provider Notes (Signed)
[facility] PROGRESSIVE CARE Provider Note  CSN: [id number] Arrival date & time: 02/28/23 2123  Chief Complaint(s) Hyperglycemia  HPI [name] [name]. is a 22 y.o. male with extensive past medical history listed below including type 1 diabetes who presents to the emergency department with 3 days of elevated blood sugar levels with associated nausea and nonbloody nonbilious emesis.  Patient reports that he is visiting from [place] and left his insulin in the car 3 days ago.  States that he has been using it but believes that the heat might of messed the insulin.  Patient denies any EtOH use but admits to marijuana use.  Denies any diarrhea.  Endorses upper abdominal pain since he has been throwing up.  No coughing or congestion.  No chest pain.  No other physical complaints.  The history is provided by the patient.    Past Medical History Past Medical History:  Diagnosis Date   ADD (attention deficit disorder)    Allergy    Anxiety    Asthma    Depression    DKA (diabetic ketoacidosis) (HCC) 11/10/2020   Epileptic seizure (HCC)    "both petit and grand mal; last sz ~ 2 wk ago" (06/17/2013)   Fracture of multiple ribs 05/11/2020   HCV (hepatitis C virus)    Headache(784.0)    "2-3 times/wk usually" (06/17/2013)   Heart murmur    "heard a slight one earlier today" (06/17/2013)   Homeless    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   Migraine    "maybe once/month; it's severe" (06/17/2013)   Migraine without status migrainosus, not intractable 12/13/2013   ODD (oppositional defiant disorder)    Partial small bowel obstruction (HCC) 11/11/2021   SBO (small bowel obstruction) (HCC) 11/11/2021   Sickle cell trait (HCC)    Suicidal ideation 10/06/2019   Suicidal ideations 03/31/2014   Type I diabetes mellitus (HCC)    "that's what they're thinking now" (06/17/2013)   Vision abnormalities    "takes him longer to focus cause; from his sz" (06/17/2013)   Patient  Active Problem List   Diagnosis Date Noted   Transaminitis 03/01/2023   Elevated alkaline phosphatase level 03/01/2023   DKA (diabetic ketoacidosis) (HCC) 11/08/2022   DKA, type 1 (HCC) 07/06/2022   Homeless 07/02/2022   ADD (attention deficit disorder)    Hep C w/o coma, chronic (HCC) 07/19/2021   Urinary retention 07/19/2021   Crohn's disease of small intestine without complication (HCC) 06/17/2021   Hypertriglyceridemia 12/27/2020   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   Tobacco abuse 12/11/2020   IVDU (intravenous drug user) 09/14/2020   Schizoaffective disorder (HCC) 05/18/2020    Class: Acute   Polysubstance abuse (HCC)    Amphetamine-type substance use disorder, 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   Bipolar disorder (HCC) 10/06/2019   Mild intermittent asthma without complication 08/29/2019   Anxiety 07/10/2019   Adjustment disorder with mixed disturbance of emotions and conduct    Borderline personality disorder (HCC) 02/26/2018   MDD (major depressive disorder) 07/24/2017   Uncontrolled type 1 diabetes circulatory disorder erectile dysfunction    History of migraine 12/01/2016   Disordered eating 03/08/2015   Non compliance w medication regimen    Diabetic peripheral neuropathy associated with type 1 diabetes mellitus (HCC) 09/29/2014   Type 1 diabetes mellitus with hyperglycemia (HCC)    Noncompliance    Abdominal pain    Severe bipolar I disorder, most recent episode depressed (HCC) 07/12/2014   Involuntary movements 06/13/2014   Bilateral leg pain  06/13/2014   Somatic symptom disorder, persistent, moderate 04/07/2014   Sleepwalking disorder 04/07/2014   Oppositional defiant disorder 03/03/2014   Attention deficit hyperactivity disorder (ADHD), combined type, moderate 02/16/2014   Microcytic anemia 02/15/2014   Hyperglycemia 02/14/2014   Goiter 02/04/2014   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) 02/04/2014   Acquired acanthosis nigricans 02/04/2014    Hyperinsulinemia 02/04/2014   Maladaptive health behaviors affecting medical condition 02/02/2014   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 Authorizing Provider  buprenorphine-naloxone (SUBOXONE) 8-2 mg SUBL SL tablet Place 0.5 tablets under the tongue 3 (three) times daily for 14 days. 02/27/23 03/13/23 Yes Vonna Drafts, MD  insulin glargine (LANTUS) 100 UNIT/ML Solostar Pen Inject 30 Units into the skin 2 (two) times daily. 02/27/23  Yes Vonna Drafts, MD  Accu-Chek Softclix Lancets lancets Use as instructed 02/27/23   Vonna Drafts, MD  blood glucose meter kit and supplies Use up to four times daily as directed. 02/27/23   Vonna Drafts, MD  glucose blood (ACCU-CHEK GUIDE) test strip Use as instructed 02/27/23   Vonna Drafts, MD  insulin aspart (NOVOLOG FLEXPEN) 100 UNIT/ML FlexPen Inject 0-20 Units into the skin 3 (three) times daily with meals. Per sliding scale 02/27/23   Vonna Drafts, MD  Insulin Pen Needle 31G X 5 MM MISC Use to inject insulin 5 times daily as directed. 02/27/23   Vonna Drafts, MD  lisinopril (ZESTRIL) 10 MG tablet Take 1 tablet (10 mg total) by mouth daily. 11/10/22   Leatha Gilding, MD  ondansetron (ZOFRAN) 4 MG tablet Take 1 tablet (4 mg total) by mouth every 8 (eight) hours as needed for nausea or vomiting. 11/10/22   Leatha Gilding, MD  QUEtiapine (SEROQUEL) 50 MG tablet Take 1 and 1/2 tablets (75 mg total) by mouth at bedtime. 11/10/22 12/10/22   Leatha Gilding, MD  sertraline (ZOLOFT) 100 MG tablet Take 1 tablet (100 mg total) by mouth daily. 11/10/22   Leatha Gilding, MD                                                                                                                                    Allergies Bee venom, Fish allergy, Shellfish allergy, Tea, and Carbamazepine  Review of Systems Review of Systems As noted in HPI  Physical Exam Vital Signs  I have reviewed the triage vital signs BP (!) 133/92 (BP Location: Left Arm)   Pulse 75  Temp 97.6 F (36.4 C) (Oral)   Resp 18   Ht 5' 10.5" (1.791 m)   Wt 79.3 kg   SpO2 97%   BMI 24.73 kg/m   Physical Exam Vitals reviewed.  Constitutional:      General: He is not in acute distress.    Appearance: He is well-developed. He is not diaphoretic.  HENT:     Head: Normocephalic and atraumatic.     Nose: Nose normal.  Eyes:     General: No scleral icterus.       Right eye: No discharge.        Left eye: No discharge.     Conjunctiva/sclera: Conjunctivae normal.     Pupils: Pupils are equal, round, and reactive to light.  Cardiovascular:     Rate and Rhythm: Normal rate and regular rhythm.     Heart sounds: No murmur heard.    No friction rub. No gallop.  Pulmonary:     Effort: Pulmonary effort is normal. Tachypnea (Kussmaul) present. No respiratory distress.     Breath sounds: Normal breath sounds. No stridor. No rales.  Abdominal:     General: There is no distension.     Palpations: Abdomen is soft.     Tenderness: There is abdominal tenderness in the right upper quadrant, epigastric area and periumbilical area. There is no guarding or rebound.  Musculoskeletal:        General: No tenderness.     Cervical back: Normal range of motion and neck supple.  Skin:    General: Skin is warm and dry.     Findings: No erythema or rash.  Neurological:     Mental Status: He is alert and oriented to person, place, and time.     ED Results and  Treatments Labs (all labs ordered are listed, but only abnormal results are displayed) Labs Reviewed  CBC - Abnormal; Notable for the following components:      Result Value   Hemoglobin 12.2 (*)    HCT 37.6 (*)    All other components within normal limits  URINALYSIS, ROUTINE W REFLEX MICROSCOPIC - Abnormal; Notable for the following components:   Color, Urine COLORLESS (*)    Glucose, UA >=500 (*)    Ketones, ur 20 (*)    All other components within normal limits  COMPREHENSIVE METABOLIC PANEL - Abnormal; Notable for the following components:   Sodium 125 (*)    Chloride 89 (*)    CO2 18 (*)    Glucose, Bld 653 (*)    BUN 26 (*)    Calcium 8.8 (*)    Albumin 3.3 (*)    AST 155 (*)    ALT 188 (*)    Alkaline Phosphatase 184 (*)    Anion gap 18 (*)    All other components within normal limits  BETA-HYDROXYBUTYRIC ACID - Abnormal; Notable for the following components:   Beta-Hydroxybutyric Acid 1.70 (*)    All other components within normal limits  BASIC METABOLIC PANEL - Abnormal; Notable for the following components:   Potassium 3.3 (*)    CO2 18 (*)    Glucose, Bld 185 (*)    Calcium 8.5 (*)    Anion gap 16 (*)    All other components within normal limits  GLUCOSE, CAPILLARY - Abnormal; Notable for the following components:   Glucose-Capillary 161 (*)    All other components within normal limits  GLUCOSE, CAPILLARY - Abnormal; Notable for the following components:   Glucose-Capillary  131 (*)    All other components within normal limits  CBG MONITORING, ED - Abnormal; Notable for the following components:   Glucose-Capillary >600 (*)    All other components within normal limits  I-STAT CHEM 8, ED - Abnormal; Notable for the following components:   Sodium 126 (*)    Potassium 5.6 (*)    Chloride 97 (*)    BUN 26 (*)    Creatinine, Ser 0.60 (*)    Glucose, Bld >700 (*)    Calcium, Ion 1.09 (*)    TCO2 17 (*)    All other components within normal limits  I-STAT  VENOUS BLOOD GAS, ED - Abnormal; Notable for the following components:   pCO2, Ven 34.0 (*)    pO2, Ven 201 (*)    Sodium 125 (*)    Calcium, Ion 1.12 (*)    HCT 37.0 (*)    Hemoglobin 12.6 (*)    All other components within normal limits  CBG MONITORING, ED - Abnormal; Notable for the following components:   Glucose-Capillary 580 (*)    All other components within normal limits  I-STAT CG4 LACTIC ACID, ED - Abnormal; Notable for the following components:   Lactic Acid, Venous 3.0 (*)    All other components within normal limits  I-STAT CHEM 8, ED - Abnormal; Notable for the following components:   Sodium 125 (*)    Chloride 94 (*)    BUN 29 (*)    Creatinine, Ser 0.50 (*)    Glucose, Bld 681 (*)    Calcium, Ion 1.12 (*)    Hemoglobin 12.6 (*)    HCT 37.0 (*)    All other components within normal limits  I-STAT VENOUS BLOOD GAS, ED - Abnormal; Notable for the following components:   pCO2, Ven 26.2 (*)    pO2, Ven 109 (*)    Bicarbonate 16.9 (*)    TCO2 18 (*)    Acid-base deficit 5.0 (*)    Sodium 125 (*)    Potassium 5.5 (*)    Calcium, Ion 1.11 (*)    HCT 62.0 (*)    Hemoglobin 21.1 (*)    All other components within normal limits  CBG MONITORING, ED - Abnormal; Notable for the following components:   Glucose-Capillary 400 (*)    All other components within normal limits  CBG MONITORING, ED - Abnormal; Notable for the following components:   Glucose-Capillary 262 (*)    All other components within normal limits  CBG MONITORING, ED - Abnormal; Notable for the following components:   Glucose-Capillary 181 (*)    All other components within normal limits  CBG MONITORING, ED - Abnormal; Notable for the following components:   Glucose-Capillary 187 (*)    All other components within normal limits  LIPASE, BLOOD  BETA-HYDROXYBUTYRIC ACID  BETA-HYDROXYBUTYRIC ACID  BASIC METABOLIC PANEL  BASIC METABOLIC PANEL  BASIC METABOLIC PANEL  BASIC METABOLIC PANEL  EKG  EKG Interpretation Date/Time:    Ventricular Rate:    PR Interval:    QRS Duration:    QT Interval:    QTC Calculation:   R Axis:      Text Interpretation:         Radiology CT ABDOMEN PELVIS WO CONTRAST  Result Date: 03/01/2023 CLINICAL DATA:  Epigastric pain EXAM: CT ABDOMEN AND PELVIS WITHOUT CONTRAST TECHNIQUE: Multidetector CT imaging of the abdomen and pelvis was performed following the standard protocol without IV contrast. RADIATION DOSE REDUCTION: This exam was performed according to the departmental dose-optimization program which includes automated exposure control, adjustment of the mA and/or kV according to patient size and/or use of iterative reconstruction technique. COMPARISON:  Recent prior CT scan of the abdomen and pelvis 11/09/2022 FINDINGS: Lower chest: No acute abnormality. Hepatobiliary: No focal liver abnormality is seen. No gallstones, gallbladder wall thickening, or biliary dilatation. Pancreas: Unremarkable. No pancreatic ductal dilatation or surrounding inflammatory changes. Spleen: Normal in size without focal abnormality. Adrenals/Urinary Tract: Normal adrenal glands. Mild fullness of the right renal collecting system. No evidence of nephrolithiasis. Massive distention of the bladder which is partially imaged. The bladder now extends to the inferior aspect of the liver which is greater than seen on imaging from 11/09/2022. Stomach/Bowel: Normal appendix. No bowel obstruction or focal bowel wall thickening. Vascular/Lymphatic: Limited evaluation in the absence of intravenous contrast. No aneurysm or atherosclerotic plaque. No suspicious lymphadenopathy. Other: No abdominal wall hernia or abnormality. No abdominopelvic ascites. Musculoskeletal: No acute or significant osseous findings. IMPRESSION: 1. Progressive massive distention of the bladder. Findings  suggest chronic bladder outlet obstruction. Recommend urological consultation if not already performed. 2. Mild fullness of the right renal collecting system likely secondary to reflux from the bladder. Electronically Signed   By: Malachy Moan M.D.   On: 03/01/2023 05:55    Medications Ordered in ED Medications  insulin regular, human (MYXREDLIN) 100 units/ 100 mL infusion (0.5 Units/hr Intravenous Rate/Dose Change 03/01/23 0642)  dextrose 5 % in lactated ringers infusion ( Intravenous Stopped 03/01/23 0250)  dextrose 50 % solution 0-50 mL (has no administration in time range)  sodium chloride 0.9 % bolus 1,000 mL (0 mLs Intravenous Stopped 03/01/23 0400)    Followed by  0.9 %  sodium chloride infusion (has no administration in time range)  lisinopril (ZESTRIL) tablet 10 mg (has no administration in time range)  QUEtiapine (SEROQUEL) tablet 75 mg (has no administration in time range)  sertraline (ZOLOFT) tablet 100 mg (has no administration in time range)  enoxaparin (LOVENOX) injection 40 mg (has no administration in time range)  potassium chloride 10 mEq in 100 mL IVPB (has no administration in time range)  lactated ringers bolus 1,000 mL (0 mLs Intravenous Stopped 03/01/23 0100)  metoCLOPramide (REGLAN) injection 10 mg (10 mg Intravenous Given 02/28/23 2344)  ketorolac (TORADOL) 15 MG/ML injection 15 mg (15 mg Intravenous Given 02/28/23 2343)  morphine (PF) 2 MG/ML injection 2 mg (2 mg Intravenous Given 03/01/23 0411)   Procedures .Critical Care  Performed by: [name], MD Authorized by: [name], MD   Critical care provider statement:    Critical care time (minutes):  45   Critical care time was exclusive of:  Separately billable procedures and treating other patients   Critical care was necessary to treat or prevent imminent or life-threatening deterioration of the following conditions:  Metabolic crisis   Critical care was time spent personally by me on the  following activities:  Development of treatment plan with patient or surrogate, discussions with consultants, evaluation of patient's response to treatment, examination of patient, obtaining history from patient or surrogate, review of old charts, re-evaluation of patient's condition, pulse oximetry, ordering and review of radiographic studies, ordering and review of laboratory studies and ordering and performing treatments and interventions   Care discussed with: admitting provider     (including critical care time) Medical Decision Making / ED Course   Medical Decision Making Amount and/or Complexity of Data Reviewed Labs: ordered. Decision-making details documented in ED Course. Radiology: ordered.  Risk Prescription drug management. Decision regarding hospitalization.    Hyperglycemia. Abdominal pain with nausea vomiting.  Differential diagnosis considered Low suspicious for DKA.  Will assess for electrolyte/metabolic derangements.  Likely from insufficient insulin.  Will rule out other intra-abdominal inflammatory/infectious processes.  IV fluids, antiemetics and pain medicine started. CBG greater than 600.  I-STAT Chem-8 consistent with hyperglycemia with acidosis.  Pseudohyponatremia.  Mild hypokalemia at 5.6 -possible hemolysis.  No renal sufficiency. CBC without leukocytosis or anemia. CMP with pseudohyponatremia.  Hyperglycemia consistent with DKA.  Improving LFTs from 2 days ago.  Started on insulin drip.  Spoke with FM team for admission.     Final Clinical Impression(s) / ED Diagnoses Final diagnoses:  Diabetic ketoacidosis without coma associated with type 1 diabetes mellitus (HCC)    This chart was dictated using voice recognition software.  Despite best efforts to proofread,  errors can occur which can change the documentation meaning.    [name], MD 03/01/23 [phone]

## 2023-02-28 NOTE — ED Triage Notes (Signed)
Pt reports CBG have read "high" at home x 3 days, has not had insulin in 2 days, [place] and accidentally left insulin at home, c/o increased thirst, abd pain, nausea

## 2023-03-01 ENCOUNTER — Inpatient Hospital Stay (HOSPITAL_COMMUNITY): Payer: Medicaid Other

## 2023-03-01 DIAGNOSIS — Z91013 Allergy to seafood: Secondary | ICD-10-CM | POA: Diagnosis not present

## 2023-03-01 DIAGNOSIS — R339 Retention of urine, unspecified: Secondary | ICD-10-CM

## 2023-03-01 DIAGNOSIS — G40909 Epilepsy, unspecified, not intractable, without status epilepticus: Secondary | ICD-10-CM | POA: Diagnosis present

## 2023-03-01 DIAGNOSIS — R7401 Elevation of levels of liver transaminase levels: Secondary | ICD-10-CM

## 2023-03-01 DIAGNOSIS — F259 Schizoaffective disorder, unspecified: Secondary | ICD-10-CM | POA: Diagnosis present

## 2023-03-01 DIAGNOSIS — J45909 Unspecified asthma, uncomplicated: Secondary | ICD-10-CM | POA: Diagnosis present

## 2023-03-01 DIAGNOSIS — Z9109 Other allergy status, other than to drugs and biological substances: Secondary | ICD-10-CM | POA: Diagnosis not present

## 2023-03-01 DIAGNOSIS — E1010 Type 1 diabetes mellitus with ketoacidosis without coma: Secondary | ICD-10-CM

## 2023-03-01 DIAGNOSIS — K5090 Crohn's disease, unspecified, without complications: Secondary | ICD-10-CM | POA: Diagnosis present

## 2023-03-01 DIAGNOSIS — B182 Chronic viral hepatitis C: Secondary | ICD-10-CM | POA: Diagnosis present

## 2023-03-01 DIAGNOSIS — E1042 Type 1 diabetes mellitus with diabetic polyneuropathy: Secondary | ICD-10-CM | POA: Diagnosis present

## 2023-03-01 DIAGNOSIS — D573 Sickle-cell trait: Secondary | ICD-10-CM | POA: Diagnosis present

## 2023-03-01 DIAGNOSIS — F603 Borderline personality disorder: Secondary | ICD-10-CM | POA: Diagnosis present

## 2023-03-01 DIAGNOSIS — R748 Abnormal levels of other serum enzymes: Secondary | ICD-10-CM | POA: Insufficient documentation

## 2023-03-01 DIAGNOSIS — F17210 Nicotine dependence, cigarettes, uncomplicated: Secondary | ICD-10-CM | POA: Diagnosis present

## 2023-03-01 DIAGNOSIS — Z91199 Patient's noncompliance with other medical treatment and regimen due to unspecified reason: Secondary | ICD-10-CM | POA: Diagnosis not present

## 2023-03-01 DIAGNOSIS — F909 Attention-deficit hyperactivity disorder, unspecified type: Secondary | ICD-10-CM | POA: Diagnosis present

## 2023-03-01 DIAGNOSIS — Z91030 Bee allergy status: Secondary | ICD-10-CM | POA: Diagnosis not present

## 2023-03-01 DIAGNOSIS — Z5329 Procedure and treatment not carried out because of patient's decision for other reasons: Secondary | ICD-10-CM | POA: Diagnosis not present

## 2023-03-01 DIAGNOSIS — F419 Anxiety disorder, unspecified: Secondary | ICD-10-CM | POA: Diagnosis present

## 2023-03-01 DIAGNOSIS — Z794 Long term (current) use of insulin: Secondary | ICD-10-CM | POA: Diagnosis not present

## 2023-03-01 DIAGNOSIS — F319 Bipolar disorder, unspecified: Secondary | ICD-10-CM | POA: Diagnosis present

## 2023-03-01 DIAGNOSIS — Z888 Allergy status to other drugs, medicaments and biological substances status: Secondary | ICD-10-CM | POA: Diagnosis not present

## 2023-03-01 DIAGNOSIS — I10 Essential (primary) hypertension: Secondary | ICD-10-CM | POA: Diagnosis present

## 2023-03-01 DIAGNOSIS — T383X6A Underdosing of insulin and oral hypoglycemic [antidiabetic] drugs, initial encounter: Secondary | ICD-10-CM | POA: Diagnosis present

## 2023-03-01 LAB — BASIC METABOLIC PANEL
Anion gap: 16 — ABNORMAL HIGH (ref 5–15)
Anion gap: 11 (ref 5–15)
Anion gap: 11 (ref 5–15)
Anion gap: 15 (ref 5–15)
Anion gap: 14 (ref 5–15)
BUN: 17 mg/dL (ref 6–20)
BUN: 12 mg/dL (ref 6–20)
BUN: 9 mg/dL (ref 6–20)
BUN: 11 mg/dL (ref 6–20)
BUN: 11 mg/dL (ref 6–20)
CO2: 18 mmol/L — ABNORMAL LOW (ref 22–32)
CO2: 22 mmol/L (ref 22–32)
CO2: 20 mmol/L — ABNORMAL LOW (ref 22–32)
CO2: 17 mmol/L — ABNORMAL LOW (ref 22–32)
CO2: 19 mmol/L — ABNORMAL LOW (ref 22–32)
Calcium: 8.5 mg/dL — ABNORMAL LOW (ref 8.9–10.3)
Calcium: 8.2 mg/dL — ABNORMAL LOW (ref 8.9–10.3)
Calcium: 8.2 mg/dL — ABNORMAL LOW (ref 8.9–10.3)
Calcium: 8.3 mg/dL — ABNORMAL LOW (ref 8.9–10.3)
Calcium: 8.4 mg/dL — ABNORMAL LOW (ref 8.9–10.3)
Chloride: 101 mmol/L (ref 98–111)
Chloride: 103 mmol/L (ref 98–111)
Chloride: 105 mmol/L (ref 98–111)
Chloride: 102 mmol/L (ref 98–111)
Chloride: 99 mmol/L (ref 98–111)
Creatinine, Ser: 0.65 mg/dL (ref 0.61–1.24)
Creatinine, Ser: 0.6 mg/dL — ABNORMAL LOW (ref 0.61–1.24)
Creatinine, Ser: 0.7 mg/dL (ref 0.61–1.24)
Creatinine, Ser: 1 mg/dL (ref 0.61–1.24)
Creatinine, Ser: 0.81 mg/dL (ref 0.61–1.24)
GFR, Estimated: >60 mL/min (ref >60)
GFR, Estimated: >60 mL/min (ref >60)
GFR, Estimated: >60 mL/min (ref >60)
GFR, Estimated: >60 mL/min (ref >60)
GFR, Estimated: >60 mL/min (ref >60)
Glucose, Bld: 185 mg/dL — ABNORMAL HIGH (ref 70–99)
Glucose, Bld: 157 mg/dL — ABNORMAL HIGH (ref 70–99)
Glucose, Bld: 137 mg/dL — ABNORMAL HIGH (ref 70–99)
Glucose, Bld: 319 mg/dL — ABNORMAL HIGH (ref 70–99)
Glucose, Bld: 323 mg/dL — ABNORMAL HIGH (ref 70–99)
Potassium: 3.3 mmol/L — ABNORMAL LOW (ref 3.5–5.1)
Potassium: 3 mmol/L — ABNORMAL LOW (ref 3.5–5.1)
Potassium: 3.5 mmol/L (ref 3.5–5.1)
Potassium: 4.4 mmol/L (ref 3.5–5.1)
Potassium: 4.2 mmol/L (ref 3.5–5.1)
Sodium: 135 mmol/L (ref 135–145)
Sodium: 136 mmol/L (ref 135–145)
Sodium: 136 mmol/L (ref 135–145)
Sodium: 134 mmol/L — ABNORMAL LOW (ref 135–145)
Sodium: 132 mmol/L — ABNORMAL LOW (ref 135–145)

## 2023-03-01 LAB — I-STAT CG4 LACTIC ACID, ED: Lactic Acid, Venous: 3 mmol/L (ref 0.5–1.9)

## 2023-03-01 LAB — I-STAT CHEM 8, ED
BUN: 29 mg/dL — ABNORMAL HIGH (ref 6–20)
Calcium, Ion: 1.12 mmol/L — ABNORMAL LOW (ref 1.15–1.40)
Chloride: 94 mmol/L — ABNORMAL LOW (ref 98–111)
Creatinine, Ser: 0.5 mg/dL — ABNORMAL LOW (ref 0.61–1.24)
Glucose, Bld: 681 mg/dL (ref 70–99)
HCT: 37 % — ABNORMAL LOW (ref 39.0–52.0)
Hemoglobin: 12.6 g/dL — ABNORMAL LOW (ref 13.0–17.0)
Potassium: 4.6 mmol/L (ref 3.5–5.1)
Sodium: 125 mmol/L — ABNORMAL LOW (ref 135–145)
TCO2: 22 mmol/L (ref 22–32)

## 2023-03-01 LAB — COMPREHENSIVE METABOLIC PANEL
ALT: 188 U/L — ABNORMAL HIGH (ref 0–44)
ALT: 221 IU/L — ABNORMAL HIGH (ref 0–44)
AST: 155 U/L — ABNORMAL HIGH (ref 15–41)
AST: 258 IU/L — ABNORMAL HIGH (ref 0–40)
Albumin: 3.3 g/dL — ABNORMAL LOW (ref 3.5–5.0)
Albumin: 3.7 g/dL — ABNORMAL LOW (ref 4.3–5.2)
Alkaline Phosphatase: 180 IU/L — ABNORMAL HIGH (ref 44–121)
Alkaline Phosphatase: 184 U/L — ABNORMAL HIGH (ref 38–126)
Anion gap: 18 — ABNORMAL HIGH (ref 5–15)
BUN/Creatinine Ratio: 21 — ABNORMAL HIGH (ref 9–20)
BUN: 12 mg/dL (ref 6–20)
BUN: 26 mg/dL — ABNORMAL HIGH (ref 6–20)
Bilirubin Total: 0.3 mg/dL (ref 0.0–1.2)
CO2: 18 mmol/L — ABNORMAL LOW (ref 22–32)
CO2: 20 mmol/L (ref 20–29)
Calcium: 8.8 mg/dL (ref 8.7–10.2)
Calcium: 8.8 mg/dL — ABNORMAL LOW (ref 8.9–10.3)
Chloride: 105 mmol/L (ref 96–106)
Chloride: 89 mmol/L — ABNORMAL LOW (ref 98–111)
Creatinine, Ser: 0.56 mg/dL — ABNORMAL LOW (ref 0.76–1.27)
Creatinine, Ser: 0.92 mg/dL (ref 0.61–1.24)
GFR, Estimated: >60 mL/min (ref >60)
Globulin, Total: 3.1 g/dL (ref 1.5–4.5)
Glucose, Bld: 653 mg/dL (ref 70–99)
Glucose: 129 mg/dL — ABNORMAL HIGH (ref 70–99)
Potassium: 4.5 mmol/L (ref 3.5–5.1)
Potassium: 4.6 mmol/L (ref 3.5–5.2)
Sodium: 125 mmol/L — ABNORMAL LOW (ref 135–145)
Sodium: 139 mmol/L (ref 134–144)
Total Bilirubin: 1 mg/dL (ref 0.3–1.2)
Total Protein: 6.8 g/dL (ref 6.0–8.5)
Total Protein: 6.9 g/dL (ref 6.5–8.1)
eGFR: 142 mL/min/{1.73_m2} (ref >59)

## 2023-03-01 LAB — MICROALBUMIN / CREATININE URINE RATIO
Creatinine, Urine: 70.9 mg/dL
Microalb/Creat Ratio: <4 mg/g{creat} (ref 0–29)
Microalbumin, Urine: <3 ug/mL

## 2023-03-01 LAB — LIPASE, BLOOD: Lipase: 48 U/L (ref 11–51)

## 2023-03-01 LAB — GLUCOSE, CAPILLARY
Glucose-Capillary: 161 mg/dL — ABNORMAL HIGH (ref 70–99)
Glucose-Capillary: 131 mg/dL — ABNORMAL HIGH (ref 70–99)
Glucose-Capillary: 161 mg/dL — ABNORMAL HIGH (ref 70–99)
Glucose-Capillary: 215 mg/dL — ABNORMAL HIGH (ref 70–99)
Glucose-Capillary: 176 mg/dL — ABNORMAL HIGH (ref 70–99)
Glucose-Capillary: 130 mg/dL — ABNORMAL HIGH (ref 70–99)
Glucose-Capillary: 147 mg/dL — ABNORMAL HIGH (ref 70–99)
Glucose-Capillary: 211 mg/dL — ABNORMAL HIGH (ref 70–99)
Glucose-Capillary: 284 mg/dL — ABNORMAL HIGH (ref 70–99)
Glucose-Capillary: 310 mg/dL — ABNORMAL HIGH (ref 70–99)
Glucose-Capillary: 292 mg/dL — ABNORMAL HIGH (ref 70–99)
Glucose-Capillary: 316 mg/dL — ABNORMAL HIGH (ref 70–99)

## 2023-03-01 LAB — BETA-HYDROXYBUTYRIC ACID
Beta-Hydroxybutyric Acid: 0.19 mmol/L (ref 0.05–0.27)
Beta-Hydroxybutyric Acid: 0.43 mmol/L — ABNORMAL HIGH (ref 0.05–0.27)
Beta-Hydroxybutyric Acid: 1.7 mmol/L — ABNORMAL HIGH (ref 0.05–0.27)

## 2023-03-01 LAB — I-STAT VENOUS BLOOD GAS, ED
Acid-base deficit: 2 mmol/L (ref 0.0–2.0)
Acid-base deficit: 5 mmol/L — ABNORMAL HIGH (ref 0.0–2.0)
Bicarbonate: 16.9 mmol/L — ABNORMAL LOW (ref 20.0–28.0)
Bicarbonate: 21.9 mmol/L (ref 20.0–28.0)
Calcium, Ion: 1.11 mmol/L — ABNORMAL LOW (ref 1.15–1.40)
Calcium, Ion: 1.12 mmol/L — ABNORMAL LOW (ref 1.15–1.40)
HCT: 37 % — ABNORMAL LOW (ref 39.0–52.0)
HCT: 62 % — ABNORMAL HIGH (ref 39.0–52.0)
Hemoglobin: 12.6 g/dL — ABNORMAL LOW (ref 13.0–17.0)
Hemoglobin: 21.1 g/dL (ref 13.0–17.0)
O2 Saturation: 100 %
O2 Saturation: 98 %
Patient temperature: 37
Potassium: 4.6 mmol/L (ref 3.5–5.1)
Potassium: 5.5 mmol/L — ABNORMAL HIGH (ref 3.5–5.1)
Sodium: 125 mmol/L — ABNORMAL LOW (ref 135–145)
Sodium: 125 mmol/L — ABNORMAL LOW (ref 135–145)
TCO2: 18 mmol/L — ABNORMAL LOW (ref 22–32)
TCO2: 23 mmol/L (ref 22–32)
pCO2, Ven: 26.2 mmHg — ABNORMAL LOW (ref 44–60)
pCO2, Ven: 34 mmHg — ABNORMAL LOW (ref 44–60)
pH, Ven: 7.417 (ref 7.25–7.43)
pH, Ven: 7.418 (ref 7.25–7.43)
pO2, Ven: 109 mmHg — ABNORMAL HIGH (ref 32–45)
pO2, Ven: 201 mmHg — ABNORMAL HIGH (ref 32–45)

## 2023-03-01 LAB — CBG MONITORING, ED
Glucose-Capillary: 181 mg/dL — ABNORMAL HIGH (ref 70–99)
Glucose-Capillary: 187 mg/dL — ABNORMAL HIGH (ref 70–99)
Glucose-Capillary: 262 mg/dL — ABNORMAL HIGH (ref 70–99)
Glucose-Capillary: 400 mg/dL — ABNORMAL HIGH (ref 70–99)

## 2023-03-01 MED ORDER — MORPHINE SULFATE (PF) 2 MG/ML IV SOLN
1.0000 mg | Freq: Once | INTRAVENOUS | Status: AC
  Administered 2023-03-01: 1 mg via INTRAVENOUS
  Filled 2023-03-01: qty 1

## 2023-03-01 MED ORDER — POTASSIUM CHLORIDE 10 MEQ/100ML IV SOLN
10.0000 meq | INTRAVENOUS | Status: DC
  Administered 2023-03-01: 10 meq via INTRAVENOUS
  Filled 2023-03-01: qty 100

## 2023-03-01 MED ORDER — ACCU-CHEK SOFTCLIX LANCETS MISC: 12 refills | Status: AC

## 2023-03-01 MED ORDER — LACTATED RINGERS IV SOLN: INTRAVENOUS | Status: DC

## 2023-03-01 MED ORDER — ACCU-CHEK GUIDE VI STRP: ORAL_STRIP | 0 refills | Status: AC

## 2023-03-01 MED ORDER — INSULIN ASPART 100 UNIT/ML IJ SOLN
3.0000 [IU] | Freq: Three times a day (TID) | INTRAMUSCULAR | Status: DC
  Administered 2023-03-01: 3 [IU] via SUBCUTANEOUS

## 2023-03-01 MED ORDER — BLOOD GLUCOSE METER KIT: PACK | 0 refills | Status: AC

## 2023-03-01 MED ORDER — SERTRALINE HCL 100 MG PO TABS
100.0000 mg | ORAL_TABLET | Freq: Every day | ORAL | Status: DC
  Administered 2023-03-01: 100 mg via ORAL
  Filled 2023-03-01: qty 1

## 2023-03-01 MED ORDER — LISINOPRIL 10 MG PO TABS
10.0000 mg | ORAL_TABLET | Freq: Every day | ORAL | Status: DC
  Administered 2023-03-01: 10 mg via ORAL
  Filled 2023-03-01: qty 1

## 2023-03-01 MED ORDER — MORPHINE SULFATE (PF) 2 MG/ML IV SOLN
2.0000 mg | Freq: Once | INTRAVENOUS | Status: AC
  Administered 2023-03-01: 2 mg via INTRAVENOUS
  Filled 2023-03-01: qty 1

## 2023-03-01 MED ORDER — INSULIN PEN NEEDLE 31G X 5 MM MISC: 1.0000 | Freq: Every day | 0 refills | Status: AC | PRN

## 2023-03-01 MED ORDER — ENOXAPARIN SODIUM 40 MG/0.4ML IJ SOSY
40.0000 mg | PREFILLED_SYRINGE | INTRAMUSCULAR | Status: DC
  Filled 2023-03-01: qty 0.4

## 2023-03-01 MED ORDER — BUPRENORPHINE HCL-NALOXONE HCL 8-2 MG SL SUBL: 0.5000 | SUBLINGUAL_TABLET | Freq: Three times a day (TID) | SUBLINGUAL | Status: DC

## 2023-03-01 MED ORDER — INSULIN REGULAR(HUMAN) IN NACL 100-0.9 UT/100ML-% IV SOLN
INTRAVENOUS | Status: DC
  Administered 2023-03-01: 12 [IU]/h via INTRAVENOUS

## 2023-03-01 MED ORDER — INSULIN GLARGINE 100 UNIT/ML SOLOSTAR PEN: 30.0000 [IU] | PEN_INJECTOR | Freq: Every day | SUBCUTANEOUS | 0 refills | Status: AC

## 2023-03-01 MED ORDER — INSULIN ASPART 100 UNIT/ML IJ SOLN
0.0000 [IU] | Freq: Three times a day (TID) | INTRAMUSCULAR | Status: DC
  Administered 2023-03-01: 5 [IU] via SUBCUTANEOUS

## 2023-03-01 MED ORDER — INSULIN GLARGINE-YFGN 100 UNIT/ML ~~LOC~~ SOLN
10.0000 [IU] | SUBCUTANEOUS | Status: DC
  Administered 2023-03-01: 10 [IU] via SUBCUTANEOUS
  Filled 2023-03-01: qty 0.1

## 2023-03-01 MED ORDER — INSULIN GLARGINE-YFGN 100 UNIT/ML ~~LOC~~ SOLN
10.0000 [IU] | Freq: Once | SUBCUTANEOUS | Status: DC
  Filled 2023-03-01: qty 0.1

## 2023-03-01 MED ORDER — QUETIAPINE FUMARATE 50 MG PO TABS: 75.0000 mg | ORAL_TABLET | Freq: Every day | ORAL | Status: DC

## 2023-03-01 MED ORDER — DEXTROSE 50 % IV SOLN: 0.0000 mL | INTRAVENOUS | Status: DC | PRN

## 2023-03-01 MED ORDER — NOVOLOG FLEXPEN 100 UNIT/ML ~~LOC~~ SOPN: 5.0000 [IU] | PEN_INJECTOR | Freq: Three times a day (TID) | SUBCUTANEOUS | 0 refills | Status: AC

## 2023-03-01 MED ORDER — DEXTROSE IN LACTATED RINGERS 5 % IV SOLN: INTRAVENOUS | Status: DC

## 2023-03-01 MED ORDER — POTASSIUM CHLORIDE CRYS ER 20 MEQ PO TBCR
40.0000 meq | EXTENDED_RELEASE_TABLET | Freq: Once | ORAL | Status: AC
  Administered 2023-03-01: 40 meq via ORAL
  Filled 2023-03-01: qty 2

## 2023-03-01 NOTE — Assessment & Plan Note (Addendum)
Patient w/ abdominal pain likely from DKA and elevated Alk Phos to 180's, w/ elevated liver enzymes. Patient has hx of Chronic Hep C, that has not been treated. Given abdominal pain, elevated Alk Phos and live enzymes, will obtain CT Abdomen.  -CT Abdomen

## 2023-03-01 NOTE — H&P (Addendum)
Hospital Admission History and Physical Service Pager: [phone]  Patient name: [name] [name]. Medical record number: [id number] Date of Birth: Oct 07, 2000 Age: 22 y.o. Gender: male  Primary Care Provider: Fortunato Curling, DO Consultants: None Code Status: Full Code   Preferred Emergency Contact: None for now  Chief Complaint: DKA  Assessment and Plan: [name] Kretzer. is a 22 y.o. male presenting with DKA 2/2 non compliance/ running out of medications.  He has a PMH significant for type 1 diabetes, Bipolar disorder, Hep C, Crohn's disease, ADHD, polysubstance use. [facility]     Uncontrolled type 1 diabetes mellitus with hyperglycemia, with  long-term current use of insulin [facility])     Patient presents in DKA, note's his insulin was left out in car and is  not working. Last A1c 9.5 11/08/22. Patient recently admitted for DKA  11/08/22. He notes that he takes Novolog 0-20 units TID, and Lantus 30  units BID.  -Endotool until [name] closes -Transition to sub-q insulin when AG closed        DKA (diabetic ketoacidosis) (HCC)     Patient presents in DKA, after insulin was left in car and is no longer  working. Patient has been out for 3-4 days. On admission CBG 681 w/ AG 18.  Patient mentation is normal, but reports abdominal pain w/ TTP. Patient  was placed on Endotool in ED. Will transition when gap close x 2 -Admit FPTS, attending Dr. Lum Babe -Endotool -Transition to sub-q insulin when gap closed x 2 -BMP q4h -NPO while on endotool -Morphine 2 mg x 1 for pain        Transaminitis     Patient w/ elevated AST 155 and ALT 188, and hx of Chronic Hep C. Liver  enzymes elevated chronically, patient reports issues in past with  connecting with ID. Given abdominal pain and liver enzymes, will also  obtain CT Abdomen. -CT Abd Pelv -CTM liven enzymes -Consider ID consult / connect outpatient        Elevated alkaline phosphatase level     Patient w/ abdominal pain  likely from DKA and elevated Alk Phos to  180's, w/ elevated liver enzymes. Patient has hx of Chronic Hep C, that  has not been treated. Given abdominal pain, elevated Alk Phos and live  enzymes, will obtain CT Abdomen.  -CT Abdomen       Chronic and Stable Conditions: Crohns disease: Not on medication ADHD: Not on medication Polysubstance abuse: Using THC Hep C: Not treated, Consider ID consult Bipolar disorder: Seroquel 50 mg, Zoloft 100 mg Seizure Disorder: Reports he's on nothing, per chart review, previously reported to be on Topomax but has been out for weeks. Consider Neurology consult while inpatient to restart meds. HTN: Lisinopril 10 mg  FEN/GI: NPO VTE Prophylaxis: Lovenox  Disposition: Home  History of Present Illness:  [name] [name]. is a 22 y.o. male presenting with DKA 2/2 running out of insulin. He noted vomitting and high sugars today and came in to be seen. Patient was seen in our clinic ealier this week, but was unable to pick up his meds. Patient note's he's been vomiting and having diarrhea for the last 3-4 days, following running out of insulin. He is visiting from out of town, to see family, and plans on returning home.    In the ED, patient presented w/ CBG 681, AG of 18, and was started on endotool, w/ IVF.  Review Of Systems: Per HPI   Pertinent Past Medical History: DM Type 1 Bipolar disorder Crohn's Disease: No meds Epilepsy: No meds ADHD  Polysubstance abuse (IVDU/Meth) Hep C Sickle Cell Trait Asthma Conversion disorder HTN  Remainder reviewed in history tab.  Pertinent Past Surgical History: None   Remainder reviewed in history tab.  Pertinent Social History: Tobacco use: Yes, 1 ppd since age since 22 yo Alcohol use: No Other Substance use: Marijuana, couple days ago Lives with [name] Tobi Bastos)  Pertinent Family History: Diabetes in [place], sister Cancer, [place]  Remainder reviewed in history tab.   Important Outpatient  Medications: Suboxone Novolog 0-20 units TID Lantus 30 units BID Lisinopril 10 mg Haldol 10 mg Zofran 4 mg Seroquel 50 mg Zoloft 100 mg Remainder reviewed in medication history.   Objective: BP (!) 141/95   Pulse 96   Temp 98 F (36.7 C)   Resp 18   Ht 5' 10.5" (1.791 m)   Wt 78.5 kg   SpO2 100%   BMI 24.47 kg/m  Exam: General: In acute pain, conversant, pleasant Cardiovascular: RRR, NRMG Respiratory: CTABL Gastrointestinal: TTP diffusely Neuro: A&Ox4, Motor 5/5, Sensation normal  Labs:  CBC BMET  Recent Labs  Lab 02/28/23 2152 03/01/23 0014 03/01/23 0016  WBC 5.1  --   --   HGB 12.2*   < > 21.1*  HCT 37.6*   < > 62.0*  PLT 372  --   --    < > = values in this interval not displayed.   Recent Labs  Lab 02/28/23 2343 03/01/23 0014 03/01/23 0015 03/01/23 0016  NA 125* 125*   < > 125*  K 4.5 4.6   < > 5.5*  CL 89* 94*  --   --   CO2 18*  --   --   --   BUN 26* 29*  --   --   CREATININE 0.92 0.50*  --   --   GLUCOSE 653* 681*  --   --   CALCIUM 8.8*  --   --   --    < > = values in this interval not displayed.      Imaging Studies Performed:  CT Abd: Pending   Bess Kinds, MD 03/01/2023, 3:23 AM PGY-3, [facility]  FPTS Intern pager: [phone], text pages welcome Secure chat group [facility] Teaching Service
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
## 2023-03-01 NOTE — Progress Notes (Signed)
Called to room by patient; unable to tolerate KCl infusing at 163ml/hr.  He had turned the drip off on his own and was complaining of burning pain up his arm.  Reinitiated drip at reduced rate, but patient still unable to tolerate and requested it be shut off.  Paged MD to make aware and received return call from Dr. Jena Gauss within 2 minutes.  Per MD, since patient remains on Endotool, unable to supplement K+ orally at this time.  Instructed to hold KCl IV for now and MD will re-check labs later today.  Will continue to monitor.
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
## 2023-03-01 NOTE — Progress Notes (Signed)
Spoke with Dr. Barbaraann Faster regarding CT results.  Bladder is very distended.  After encouraging patient to try to void, he was able to void 2 liters of clear, yellow urine.  [name] [name]
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
## 2023-03-01 NOTE — Progress Notes (Signed)
Patient c/o severe abdominal, cramping, upper right quadrant pain, ongoing since earlier this AM.  Now also states with moderate nausea; no emesis.  At this time, no PRN medication for pain or nausea present in patient's orders.  Paged FMTS and s/w Irving Burton regarding patient needs.  Will anticipate orders for pain/nausea medication.  Continue to monitor.

## 2023-03-01 NOTE — Progress Notes (Signed)
   03/01/23 1333  TOC Brief Assessment  Insurance and Status Reviewed  Patient has primary care physician Yes  Home environment has been reviewed high risk  Prior level of function: Independent  Prior/Current Home Services No current home services  Social Determinants of Health Reivew SDOH reviewed needs interventions (High risk for housing issues, food, transportation and utilities)  Readmission risk has been reviewed Yes  Transition of care needs transition of care needs identified, TOC will continue to follow (may need resource guides for transportation, housing, utility assistance and food assistance per [facility] list.)

## 2023-03-01 NOTE — Progress Notes (Signed)
Pt wants to sign out AMA. I discussed how important him staying is, just recently restarted on IV insulin. Provider on call paged and is coming to floor to see pt. Dierdre Highman, RN

## 2023-03-01 NOTE — Assessment & Plan Note (Addendum)
Patient w/ elevated AST 155 and ALT 188, and hx of Chronic Hep C. Liver enzymes elevated chronically, patient reports issues in past with connecting with ID. Given abdominal pain and liver enzymes, will also obtain CT Abdomen. -CT Abd Pelv -CTM liven enzymes -Consider ID consult / connect outpatient

## 2023-03-01 NOTE — Plan of Care (Signed)

## 2023-03-01 NOTE — Assessment & Plan Note (Addendum)
Patient presents in DKA, note's his insulin was left out in car and is not working. Last A1c 9.5 11/08/22. Patient recently admitted for DKA 11/08/22. He notes that he takes Novolog 0-20 units TID, and Lantus 30 units BID.  -Endotool until [name] [name] closes -Transition to sub-q insulin when AG closed

## 2023-03-01 NOTE — Assessment & Plan Note (Addendum)
Patient presents in DKA, after insulin was left in car and is no longer working. Patient has been out for 3-4 days. On admission CBG 681 w/ AG 18. Patient mentation is normal, but reports abdominal pain w/ TTP. Patient was placed on Endotool in ED. Will transition when gap close x 2 -Admit FPTS, attending Dr. Lum Babe -Endotool -Transition to sub-q insulin when gap closed x 2 -BMP q4h -NPO while on endotool -Morphine 2 mg x 1 for pain

## 2023-03-01 NOTE — Progress Notes (Addendum)
Patient ate lunch. Recently switched to subcutaneous insulin, was given Semglee approx two hours prior. Patient recently voided, nursing will complete bladder soon. Telemetry continued.  Patient alert and communicative. No acute distress.

## 2023-03-02 ENCOUNTER — Telehealth: Payer: Self-pay

## 2023-03-02 NOTE — Transitions of Care (Post Inpatient/ED Visit) (Signed)
   03/02/2023  Name: [name] [name]. MRN: [id number] DOB: Sep 19, 2000  Today's TOC FU Call Status: Unsuccessful Call (1st Attempt) Date: 03/02/23  Attempted to reach the patient regarding the most recent Inpatient/ED visit.  Follow Up Plan: Additional outreach attempts will be made to reach the patient to complete the Transitions of Care (Post Inpatient/ED visit) call.   Signature Karena Addison, LPN [facility] Nurse Health Advisor Direct Dial [phone]

## 2023-03-04 NOTE — Transitions of Care (Post Inpatient/ED Visit) (Signed)
   03/04/2023  Name: [name] [name]. MRN: [id number] DOB: [birth year]  Today's TOC FU Call Status: Today's TOC FU Call Status:: Unsuccessful Call (2nd Attempt) Unsuccessful Call (1st Attempt) Date: 03/02/23 Unsuccessful Call (2nd Attempt) Date: 03/04/23  Attempted to reach the patient regarding the most recent Inpatient/ED visit.  Follow Up Plan: Additional outreach attempts will be made to reach the patient to complete the Transitions of Care (Post Inpatient/ED visit) call.   Signature Kandis Fantasia, LPN [facility] Health Advisor [place] [facility] You Are. We Are. [employer] # [phone]

## 2023-03-06 LAB — TOXASSURE SELECT 13 (MW), URINE

## 2023-03-10 ENCOUNTER — Encounter: Payer: Self-pay | Admitting: Family Medicine

## 2023-03-10 ENCOUNTER — Ambulatory Visit: Payer: Self-pay | Admitting: Family Medicine

## 2023-03-10 NOTE — Progress Notes (Deleted)
    SUBJECTIVE:   CHIEF COMPLAINT / HPI: anemia follow-up  Hgb 7.4 last visit. Diet counseling with Dr. Brown. Here for further lab investigation. No new interim events. [name] [name] is doing well. Has been taking [employer] multivitamin.   PERTINENT  PMH / PSH: Anemia  OBJECTIVE:   BP (!) 111/62 Comment: on exam table  Pulse 98   Ht 3' 4.39" (1.026 m)   Wt 32 lb 2 oz (14.6 kg)   SpO2 99%   BMI 13.84 kg/m   General: NAD  HEENT: pale sclerae Cardiovascular: RRR, no murmurs, no peripheral edema Respiratory: normal WOB on RA, CTAB, no wheezes, ronchi or rales Abdomen: soft, NTTP, no rebound or guarding Extremities: Moving all 4 extremities equally   ASSESSMENT/PLAN:   Anemia, unspecified type Assessment & Plan: VSS, well appearing. Likely iron deficiency anemia. Will verify with ferritin and full CBC. Lead screening as well. If iron stores low will add iron supplementation. If not will schedule for lab visit for hgb electrophoresis and G6PD testing.  Orders: -     Ferritin -     CBC with Differential/Platelet  Need for lead screening -     Lead, Blood (Pediatric age 15 yrs or younger)  Elevated blood pressure reading Assessment & Plan: 96% percentile for age x2 in clinic. Will repeat at follow-up. Consider full primary versus secondary HTN work-up if still elevated at follow-up visit in 1 month.    Return in about 1 month (around 11/30/2022).  Michael Quillen, MD [facility]   

## 2023-03-10 NOTE — Transitions of Care (Post Inpatient/ED Visit) (Signed)
   03/10/2023  Name: [name] [name]. MRN: [id number] DOB: 2001/03/19  Today's TOC FU Call Status: Today's TOC FU Call Status:: Unsuccessful Call (3rd Attempt) Unsuccessful Call (1st Attempt) Date: 03/02/23 Unsuccessful Call (2nd Attempt) Date: 03/04/23 Unsuccessful Call (3rd Attempt) Date: 03/10/23  Attempted to reach the patient regarding the most recent Inpatient/ED visit.  Follow Up Plan: Additional outreach attempts will be made to reach the patient to complete the Transitions of Care (Post Inpatient/ED visit) call.   Signature Karena Addison, LPN [facility] Nurse Health Advisor Direct Dial [phone]

## 2024-01-15 ENCOUNTER — Encounter: Payer: Self-pay | Admitting: Advanced Practice Midwife
